# Patient Record
Sex: Male | Born: 1950 | Race: Black or African American | Hispanic: No | Marital: Married | Smoking: Never smoker
Health system: Southern US, Community
[De-identification: ages and names within clinical notes are randomized; demographics above are authoritative.]

## PROBLEM LIST (undated history)

## (undated) DIAGNOSIS — R339 Retention of urine, unspecified: Secondary | ICD-10-CM

## (undated) DIAGNOSIS — Z1624 Resistance to multiple antibiotics: Secondary | ICD-10-CM

## (undated) DIAGNOSIS — F419 Anxiety disorder, unspecified: Secondary | ICD-10-CM

## (undated) DIAGNOSIS — D649 Anemia, unspecified: Secondary | ICD-10-CM

## (undated) DIAGNOSIS — E119 Type 2 diabetes mellitus without complications: Secondary | ICD-10-CM

## (undated) DIAGNOSIS — I10 Essential (primary) hypertension: Secondary | ICD-10-CM

## (undated) DIAGNOSIS — R532 Functional quadriplegia: Secondary | ICD-10-CM

## (undated) DIAGNOSIS — N390 Urinary tract infection, site not specified: Secondary | ICD-10-CM

## (undated) DIAGNOSIS — J9610 Chronic respiratory failure, unspecified whether with hypoxia or hypercapnia: Secondary | ICD-10-CM

## (undated) HISTORY — PX: ROTATOR CUFF REPAIR: SHX139

## (undated) HISTORY — PX: TRACHEOSTOMY: SUR1362

## (undated) HISTORY — PX: COLOSTOMY: SHX63

## (undated) HISTORY — PX: PEG PLACEMENT: SHX5437

---

## 2016-04-10 DIAGNOSIS — M75102 Unspecified rotator cuff tear or rupture of left shoulder, not specified as traumatic: Secondary | ICD-10-CM | POA: Insufficient documentation

## 2016-12-12 DIAGNOSIS — G4733 Obstructive sleep apnea (adult) (pediatric): Secondary | ICD-10-CM | POA: Insufficient documentation

## 2019-05-15 ENCOUNTER — Encounter (HOSPITAL_COMMUNITY): Payer: Self-pay

## 2019-05-15 ENCOUNTER — Other Ambulatory Visit: Payer: Self-pay

## 2019-05-15 ENCOUNTER — Emergency Department (HOSPITAL_COMMUNITY): Payer: Medicare Other

## 2019-05-15 ENCOUNTER — Emergency Department (HOSPITAL_COMMUNITY)
Admission: EM | Admit: 2019-05-15 | Discharge: 2019-05-15 | Disposition: A | Discharge status: Home / Self Care | Payer: Medicare Other | Attending: Emergency Medicine | Admitting: Emergency Medicine

## 2019-05-15 DIAGNOSIS — Z20822 Contact with and (suspected) exposure to covid-19: Secondary | ICD-10-CM | POA: Diagnosis not present

## 2019-05-15 DIAGNOSIS — Z930 Tracheostomy status: Secondary | ICD-10-CM | POA: Insufficient documentation

## 2019-05-15 DIAGNOSIS — K529 Noninfective gastroenteritis and colitis, unspecified: Secondary | ICD-10-CM | POA: Diagnosis not present

## 2019-05-15 DIAGNOSIS — I10 Essential (primary) hypertension: Secondary | ICD-10-CM | POA: Insufficient documentation

## 2019-05-15 DIAGNOSIS — J189 Pneumonia, unspecified organism: Secondary | ICD-10-CM | POA: Diagnosis not present

## 2019-05-15 DIAGNOSIS — R0602 Shortness of breath: Secondary | ICD-10-CM | POA: Diagnosis present

## 2019-05-15 HISTORY — DX: Essential (primary) hypertension: I10

## 2019-05-15 HISTORY — DX: Chronic respiratory failure, unspecified whether with hypoxia or hypercapnia: J96.10

## 2019-05-15 LAB — CBC WITH DIFFERENTIAL/PLATELET
Abs Immature Granulocytes: 0.02 10*3/uL (ref 0.00–0.07)
Basophils Absolute: 0.1 10*3/uL (ref 0.0–0.1)
Basophils Relative: 1 %
Eosinophils Absolute: 0 10*3/uL (ref 0.0–0.5)
Eosinophils Relative: 0 %
HCT: 38.5 % — ABNORMAL LOW (ref 39.0–52.0)
Hemoglobin: 12.6 g/dL — ABNORMAL LOW (ref 13.0–17.0)
Immature Granulocytes: 0 %
Lymphocytes Relative: 30 %
Lymphs Abs: 2.3 10*3/uL (ref 0.7–4.0)
MCH: 24.4 pg — ABNORMAL LOW (ref 26.0–34.0)
MCHC: 32.7 g/dL (ref 30.0–36.0)
MCV: 74.5 fL — ABNORMAL LOW (ref 80.0–100.0)
Monocytes Absolute: 1.1 10*3/uL — ABNORMAL HIGH (ref 0.1–1.0)
Monocytes Relative: 15 %
Neutro Abs: 4 10*3/uL (ref 1.7–7.7)
Neutrophils Relative %: 54 %
Platelets: 278 10*3/uL (ref 150–400)
RBC: 5.17 MIL/uL (ref 4.22–5.81)
RDW: 18.1 % — ABNORMAL HIGH (ref 11.5–15.5)
WBC: 7.5 10*3/uL (ref 4.0–10.5)
nRBC: 0 % (ref 0.0–0.2)

## 2019-05-15 LAB — URINALYSIS, ROUTINE W REFLEX MICROSCOPIC
Bilirubin Urine: NEGATIVE
Glucose, UA: NEGATIVE mg/dL
Hgb urine dipstick: NEGATIVE
Ketones, ur: NEGATIVE mg/dL
Leukocytes,Ua: NEGATIVE
Nitrite: NEGATIVE
Protein, ur: NEGATIVE mg/dL
Specific Gravity, Urine: 1.017 (ref 1.005–1.030)
pH: 6 (ref 5.0–8.0)

## 2019-05-15 LAB — POCT I-STAT 7, (LYTES, BLD GAS, ICA,H+H)
Acid-Base Excess: 3 mmol/L — ABNORMAL HIGH (ref 0.0–2.0)
Bicarbonate: 27.2 mmol/L (ref 20.0–28.0)
Calcium, Ion: 1.26 mmol/L (ref 1.15–1.40)
HCT: 37 % — ABNORMAL LOW (ref 39.0–52.0)
Hemoglobin: 12.6 g/dL — ABNORMAL LOW (ref 13.0–17.0)
O2 Saturation: 97 %
Patient temperature: 98.3
Potassium: 4.3 mmol/L (ref 3.5–5.1)
Sodium: 141 mmol/L (ref 135–145)
TCO2: 28 mmol/L (ref 22–32)
pCO2 arterial: 38.8 mmHg (ref 32.0–48.0)
pH, Arterial: 7.453 — ABNORMAL HIGH (ref 7.350–7.450)
pO2, Arterial: 86 mmHg (ref 83.0–108.0)

## 2019-05-15 LAB — BASIC METABOLIC PANEL
Anion gap: 13 (ref 5–15)
BUN: 35 mg/dL — ABNORMAL HIGH (ref 8–23)
CO2: 22 mmol/L (ref 22–32)
Calcium: 9.4 mg/dL (ref 8.9–10.3)
Chloride: 104 mmol/L (ref 98–111)
Creatinine, Ser: 0.77 mg/dL (ref 0.61–1.24)
GFR calc Af Amer: >60 mL/min (ref >60)
GFR calc non Af Amer: >60 mL/min (ref >60)
Glucose, Bld: 107 mg/dL — ABNORMAL HIGH (ref 70–99)
Potassium: 5.2 mmol/L — ABNORMAL HIGH (ref 3.5–5.1)
Sodium: 139 mmol/L (ref 135–145)

## 2019-05-15 LAB — RESPIRATORY PANEL BY RT PCR (FLU A&B, COVID)
Influenza A by PCR: NEGATIVE
Influenza B by PCR: NEGATIVE
SARS Coronavirus 2 by RT PCR: NEGATIVE

## 2019-05-15 MED ORDER — IOHEXOL 300 MG/ML  SOLN
100.0000 mL | Freq: Once | INTRAMUSCULAR | Status: AC | PRN
  Administered 2019-05-15: 100 mL via INTRAVENOUS

## 2019-05-15 MED ORDER — AMOXICILLIN-POT CLAVULANATE 875-125 MG PO TABS
1.0000 | ORAL_TABLET | Freq: Two times a day (BID) | ORAL | 0 refills | Status: DC
Start: 2019-05-15 — End: 2019-07-06

## 2019-05-15 MED ORDER — SODIUM CHLORIDE 0.9 % IV SOLN
1.0000 g | Freq: Once | INTRAVENOUS | Status: DC
  Filled 2019-05-15: qty 1

## 2019-05-15 MED ORDER — SODIUM ZIRCONIUM CYCLOSILICATE 10 G PO PACK
10.0000 g | PACK | Freq: Once | ORAL | Status: DC
  Filled 2019-05-15: qty 1

## 2019-05-15 MED ORDER — VANCOMYCIN HCL 1750 MG/350ML IV SOLN
1750.0000 mg | Freq: Once | INTRAVENOUS | Status: AC
  Administered 2019-05-15: 1750 mg via INTRAVENOUS
  Filled 2019-05-15: qty 350

## 2019-05-15 MED ORDER — SODIUM CHLORIDE 0.9 % IV SOLN
2.0000 g | Freq: Once | INTRAVENOUS | Status: AC
  Administered 2019-05-15: 2 g via INTRAVENOUS
  Filled 2019-05-15: qty 2

## 2019-05-15 MED ORDER — SODIUM CHLORIDE 0.9% FLUSH: 10.0000 mL | INTRAVENOUS | Status: DC | PRN

## 2019-05-15 MED ORDER — VANCOMYCIN HCL 750 MG/150ML IV SOLN
750.0000 mg | Freq: Three times a day (TID) | INTRAVENOUS | Status: DC
  Filled 2019-05-15: qty 150

## 2019-05-15 MED ORDER — SODIUM CHLORIDE 0.9% FLUSH: 10.0000 mL | Freq: Two times a day (BID) | INTRAVENOUS | Status: DC

## 2019-05-15 NOTE — ED Provider Notes (Signed)
.  MSE was initiated and I personally evaluated the patient and placed orders (if any) at  6:28 AM on May 15, 2019.  The patient appears stable so that the remainder of the MSE may be completed by another provider.    Melea Prezioso, Delice Bison, DO 05/15/19 [phone]

## 2019-05-15 NOTE — ED Notes (Signed)
All appropriate discharge materials reviewed at length with patient. Time for questions provided. Pt has no other questions at this time and verbalizes understanding of all provided materials.  

## 2019-05-15 NOTE — Progress Notes (Signed)
Pharmacy Antibiotic Note  Luis Orr is a 69 y.o. male with chronic trach and functional quadriplegia admitted on 05/15/2019 from [facility] with shortness of breath concern for pneumonia.  Pharmacy has been consulted for Vancomycin dosing. Also receiving Cefepime in ED x1.   Patient is currently afebrile, CXR with atelectasis L >R. WBC wnl.  Note limited vascular access -- Midline.   Plan: Vancomycin 1750 mg IV x1, then Vancomycin 750 mg IV Q 8 hrs x 8 days.  Goal AUC 400-550. Expected AUC: 484 SCr used: 0.8 Follow-up if further gram negative coverage needed.  Recommend MRSA PCR to evaluate need for Vancomycin.   Increased Cefepime to 2g IV x1 Height: 5\' 10"  (177.8 cm) Weight: 81.6 kg (180 lb) IBW/kg (Calculated) : 73  Temp (24hrs), Avg:98.3 F (36.8 C), Min:98.3 F (36.8 C), Max:98.3 F (36.8 C)  No results for input(s): WBC, CREATININE, LATICACIDVEN, VANCOTROUGH, VANCOPEAK, VANCORANDOM, GENTTROUGH, GENTPEAK, GENTRANDOM, TOBRATROUGH, TOBRAPEAK, TOBRARND, AMIKACINPEAK, AMIKACINTROU, AMIKACIN in the last 168 hours.  CrCl cannot be calculated (No successful lab value found.).    Not on File  Antimicrobials this admission: Vanc 5/2 >> Cefepime 5/2 x1  Dose adjustments this admission:  Microbiology results: 5/2 COVID/Flu PCR negative  Thank you for allowing pharmacy to be a part of this patient's care.  Sloan Leiter, PharmD, BCPS, BCCCP Clinical Pharmacist Please refer to [facility] for [place] numbers 05/15/2019 7:50 AM

## 2019-05-15 NOTE — ED Triage Notes (Addendum)
Pt arrives via [facility] from [facility] for acute dyspnea that began approx 2 hours ago per staff. Patient has tracheostmy, EMS advised vent settings have not been adjusted and patient has been 97%. A&O to baseline. Pt advises of mild malaise. BP, HR WNL

## 2019-05-15 NOTE — ED Notes (Signed)
Report given to receiving [facility] nurse

## 2019-05-15 NOTE — Progress Notes (Signed)
Midline placed today. Patient with limited vascular access. Instructed on guidelines of vancomycin use. Per MD patient will be discharged with midline. Doctor and nurse, Percell Locus, at bedside and instructed on use of midline with Vancomycin only allowed to be used for no more than 5 days for vancomycin. Instructed to notify receiving facility of line restrictions and use. Percell Locus RN VU. Fran Lowes, NR VAST

## 2019-05-15 NOTE — ED Notes (Signed)
Luis Orr ([facility]/Transfer back [facility] called @ 1411)-per Phil, Therapist, sports called by Levada Dy

## 2019-05-15 NOTE — Discharge Instructions (Signed)
You have been evaluated for your abdominal pain and trouble breathing.  Your x-ray show possible pneumonia, a CT scan of the abdomen and pelvis shows possible colitis.  Please take antibiotic as prescribed as able cover for both infection.  Return if condition worsen.

## 2019-05-15 NOTE — ED Provider Notes (Signed)
[facility] EMERGENCY DEPARTMENT Provider Note   CSN: [id number] Arrival date & time: 05/15/19  3664     History Chief Complaint  Patient presents with  . Shortness of Breath    Luis Orr is a 69 y.o. male.  The history is provided by the EMS personnel and medical records. No language interpreter was used.  Shortness of Breath     69 year old male with history of chronic nausea s with tracheostomy, hypertension, functional quadriplegia brought here via EMS from [facility] for evaluation of shortness of breath.  Per EMS, patient developed difficulty breathing approximately 2 hours ago per nursing staff.  EMS was advised to the vent setting have not been adjusted and outpatient has been 97%.  It was reported that patient was alert oriented to baseline.  Patient brought here for further management.  History is limited due to his baseline tracheostomy.  Level 5 caveats  Past Medical History:  Diagnosis Date  . Chronic respiratory failure ([place])   . Hypertension     There are no problems to display for this patient.   The histories are not reviewed yet. Please review them in the "History" navigator section and refresh this Gray.     No family history on file.  Social History   Tobacco Use  . Smoking status: Not on file  Substance Use Topics  . Alcohol use: Not on file  . Drug use: Not on file    Home Medications Prior to Admission medications   Not on File    Allergies    Patient has no allergy information on record.  Review of Systems   Review of Systems  Unable to perform ROS: Patient nonverbal  Respiratory: Positive for shortness of breath.     Physical Exam Updated Vital Signs Pulse 80   Temp 98.3 F (36.8 C) (Oral)   Resp 15   Ht 5\' 10"  (1.778 m)   Wt 81.6 kg   SpO2 99%   BMI 25.83 kg/m   Physical Exam Constitutional:      Comments: Chronically ill-appearing male laying in bed currently altered to event.  Patient does  not appear to be in any acute distress.  Neck:     Comments: Trach in place.  No tracheal deviation. Cardiovascular:     Rate and Rhythm: Normal rate and regular rhythm.  Pulmonary:     Breath sounds: Decreased breath sounds and rhonchi (Faint scattered rhonchi heard.) present.  Chest:     Chest wall: No tenderness.  Abdominal:     Tenderness: There is abdominal tenderness (Patient once mildly when abdomen is palpated.  No guarding.  No focal tenderness.).  Musculoskeletal:     Comments: Minimal strength to all 4 extremities due to underlying quadriplegia.     ED Results / Procedures / Treatments   Labs (all labs ordered are listed, but only abnormal results are displayed) Labs Reviewed  CBC WITH DIFFERENTIAL/PLATELET - Abnormal; Notable for the following components:      Result Value   Hemoglobin 12.6 (*)    HCT 38.5 (*)    MCV 74.5 (*)    MCH 24.4 (*)    RDW 18.1 (*)    Monocytes Absolute 1.1 (*)    All other components within normal limits  BASIC METABOLIC PANEL - Abnormal; Notable for the following components:   Potassium 5.2 (*)    Glucose, Bld 107 (*)    BUN 35 (*)    All other components within normal  limits  POCT I-STAT 7, (LYTES, BLD GAS, ICA,H+H) - Abnormal; Notable for the following components:   pH, Arterial 7.453 (*)    Acid-Base Excess 3.0 (*)    HCT 37.0 (*)    Hemoglobin 12.6 (*)    All other components within normal limits  RESPIRATORY PANEL BY RT PCR (FLU A&B, COVID)  URINALYSIS, ROUTINE W REFLEX MICROSCOPIC  BLOOD GAS, ARTERIAL    EKG EKG Interpretation  Date/Time:  Sunday May 15 2019 34:19:62 EDT Ventricular Rate:  71 PR Interval:    QRS Duration: 83 QT Interval:  390 QTC Calculation: 424 R Axis:   -14 Text Interpretation: Sinus rhythm Atrial premature complex Borderline T wave abnormalities No old tracing to compare Confirmed by Ward, Cyril Mourning [phone]) on 05/15/2019 6:35:27 AM   Radiology CT ABDOMEN PELVIS W CONTRAST  Result Date:  05/15/2019 CLINICAL DATA:  Abdominal pain EXAM: CT ABDOMEN AND PELVIS WITH CONTRAST TECHNIQUE: Multidetector CT imaging of the abdomen and pelvis was performed using the standard protocol following bolus administration of intravenous contrast. CONTRAST:  145mL OMNIPAQUE IOHEXOL 300 MG/ML  SOLN COMPARISON:  None. FINDINGS: Lower chest: Bibasilar atelectasis. Hepatobiliary: No focal liver abnormality is seen. No gallstones, gallbladder wall thickening, or biliary dilatation. Pancreas: Unremarkable Spleen: Unremarkable. Adrenals/Urinary Tract: Right renal atrophy. Multiple left renal cysts. Nonobstructing left lower pole renal calculi measuring up to 7 mm craniocaudally. Adrenals are unremarkable. The bladder is decompressed by a Foley catheter. Stomach/Bowel: Stomach is unremarkable apart from PEG tube placement. Appendix is normal. Stool throughout the colon with distention of the rectum and mild wall thickening. Small bowel is normal in caliber. Vascular/Lymphatic: Aortic atherosclerosis. No enlarged abdominal or pelvic lymph nodes. Reproductive: Unremarkable. Other: No ascites. Musculoskeletal: Degenerative changes of the included spine. IMPRESSION: Stool distending the rectum with mild wall thickening and potential impaction. Stercoral colitis is also not excluded given mild wall thickening. Electronically Signed   By: Macy Mis M.D.   On: 05/15/2019 13:44   DG Chest Portable 1 View  Result Date: 05/15/2019 CLINICAL DATA:  Shortness of breath EXAM: PORTABLE CHEST 1 VIEW COMPARISON:  None. FINDINGS: Tracheostomy device is present. Patchy density at the left greater than right lung bases. No significant pleural effusion. No pneumothorax. Cardiomediastinal contours are likely within normal limits. IMPRESSION: Mild patchy atelectasis/consolidation at the left greater than right lung bases. Electronically Signed   By: Macy Mis M.D.   On: 05/15/2019 07:30    Procedures Procedures (including critical  care time)  Medications Ordered in ED Medications  sodium chloride flush (NS) 0.9 % injection 10-40 mL (has no administration in time range)  sodium chloride flush (NS) 0.9 % injection 10-40 mL (has no administration in time range)  vancomycin (VANCOREADY) IVPB 750 mg/150 mL (has no administration in time range)  vancomycin (VANCOREADY) IVPB 1750 mg/350 mL (1,750 mg Intravenous New Bag/Given 05/15/19 1100)  ceFEPIme (MAXIPIME) 2 g in sodium chloride 0.9 % 100 mL IVPB (0 g Intravenous Stopped 05/15/19 1054)  iohexol (OMNIPAQUE) 300 MG/ML solution 100 mL (100 mLs Intravenous Contrast Given 05/15/19 1252)    ED Course  I have reviewed the triage vital signs and the nursing notes.  Pertinent labs & imaging results that were available during my care of the patient were reviewed by me and considered in my medical decision making (see chart for details).    MDM Rules/Calculators/A&P                      BP (!) 137/91  Pulse (!) 115   Temp 98.3 F (36.8 C) (Oral)   Resp 16   Ht 5\' 10"  (1.778 m)   Wt 81.6 kg   SpO2 97%   BMI 25.83 kg/m   Final Clinical Impression(s) / ED Diagnoses Final diagnoses:  Community acquired pneumonia of left lower lobe of lung  Colitis    Rx / DC Orders ED Discharge Orders         Ordered    amoxicillin-clavulanate (AUGMENTIN) 875-125 MG tablet  2 times daily     05/15/19 1400         6:55 AM Patient with history of tracheostomy on vent here with reportedly worsening shortness of breath for the past 2 hrs, no vent setting change reported.  Pt does not appear to be in acute respiratory distress.    Labs remarkable for hypokalemia with potassium of 5.2 without EKG changes.  Repeat labs showing normal K+ of 4.2.  First value is likely due to hemolysis.  X-ray of the chest demonstrate mild patchy atelectasis/consolidation at the left greater than right lung base.  In the setting of shortness of breath, and being on the vent, will initiate antibiotics to  treat for pneumonia, however CXR reviewed by me does not show any significant infiltration.  Care discussed with Dr. Jeanell Sparrow.   11:05 AM I did reach out to speak with patient's wife over the phone.  It sounds like patient was having abdominal pain and trouble breathing this morning prompting the ER visit.  On exam he does have abdominal tenderness.  Due to inability to communicate, will obtain CT scan of his abdomen pelvis to further evaluation.  Will check UA.  1:58 PM UA without any signs of urinary tract infection.  COVID-19 test is negative.  Normal WBC.  An abdominal and pelvis CT scan was obtained showing stool distending the rectum with mild wall thickening and potential impaction.  Stercoral colitis is also not excluded given mild wall thickening.  I performed digital rectal exam under supervision.  Patient has soft stool in the rectal vault and no obvious signs of stool impaction that I can palpate.  Plan to discharge home with Augmentin to cover for potential colitis/pneumonia.  I have called and updated patient's wife over the phone.  Luis Orr was evaluated in Emergency Department on 05/15/2019 for the symptoms described in the history of present illness. He was evaluated in the context of the global COVID-19 pandemic, which necessitated consideration that the patient might be at risk for infection with the SARS-CoV-2 virus that causes COVID-19. Institutional protocols and algorithms that pertain to the evaluation of patients at risk for COVID-19 are in a state of rapid change based on information released by regulatory bodies including the CDC and federal and state organizations. These policies and algorithms were followed during the patient's care in the ED.    Domenic Moras, PA-C 05/15/19 1402    Pattricia Boss, MD 05/15/19 [phone]

## 2019-05-15 NOTE — Progress Notes (Signed)
RT note: RT and RN transported vent patient to CT and back to his room. Vital signs stable through out.

## 2019-05-15 NOTE — ED Notes (Signed)
IV team at pt bedside 

## 2019-05-24 DIAGNOSIS — J449 Chronic obstructive pulmonary disease, unspecified: Secondary | ICD-10-CM

## 2019-05-24 DIAGNOSIS — J9621 Acute and chronic respiratory failure with hypoxia: Secondary | ICD-10-CM

## 2019-05-24 DIAGNOSIS — Z930 Tracheostomy status: Secondary | ICD-10-CM

## 2019-05-24 DIAGNOSIS — G8250 Quadriplegia, unspecified: Secondary | ICD-10-CM

## 2019-05-26 DIAGNOSIS — G8250 Quadriplegia, unspecified: Secondary | ICD-10-CM

## 2019-05-26 DIAGNOSIS — J9621 Acute and chronic respiratory failure with hypoxia: Secondary | ICD-10-CM

## 2019-05-26 DIAGNOSIS — J449 Chronic obstructive pulmonary disease, unspecified: Secondary | ICD-10-CM

## 2019-05-26 DIAGNOSIS — Z930 Tracheostomy status: Secondary | ICD-10-CM

## 2019-06-07 DIAGNOSIS — Z930 Tracheostomy status: Secondary | ICD-10-CM

## 2019-06-07 DIAGNOSIS — G8250 Quadriplegia, unspecified: Secondary | ICD-10-CM

## 2019-06-07 DIAGNOSIS — J9621 Acute and chronic respiratory failure with hypoxia: Secondary | ICD-10-CM

## 2019-06-09 DIAGNOSIS — J9621 Acute and chronic respiratory failure with hypoxia: Secondary | ICD-10-CM

## 2019-06-09 DIAGNOSIS — Z930 Tracheostomy status: Secondary | ICD-10-CM

## 2019-06-09 DIAGNOSIS — I5022 Chronic systolic (congestive) heart failure: Secondary | ICD-10-CM

## 2019-07-06 ENCOUNTER — Inpatient Hospital Stay (HOSPITAL_COMMUNITY)
Admission: AD | Admit: 2019-07-06 | Discharge: 2019-07-12 | DRG: 870 | Disposition: A | Discharge status: Other Acute Inpatient Hospital | Payer: Medicare Other | Attending: Internal Medicine | Admitting: Internal Medicine

## 2019-07-06 ENCOUNTER — Emergency Department (HOSPITAL_COMMUNITY): Payer: Medicare Other

## 2019-07-06 ENCOUNTER — Encounter (HOSPITAL_COMMUNITY): Payer: Self-pay | Admitting: *Deleted

## 2019-07-06 ENCOUNTER — Other Ambulatory Visit: Payer: Self-pay

## 2019-07-06 DIAGNOSIS — Z20822 Contact with and (suspected) exposure to covid-19: Secondary | ICD-10-CM | POA: Diagnosis not present

## 2019-07-06 DIAGNOSIS — Z9911 Dependence on respirator [ventilator] status: Secondary | ICD-10-CM | POA: Diagnosis not present

## 2019-07-06 DIAGNOSIS — J95851 Ventilator associated pneumonia: Secondary | ICD-10-CM | POA: Diagnosis not present

## 2019-07-06 DIAGNOSIS — Z794 Long term (current) use of insulin: Secondary | ICD-10-CM | POA: Diagnosis not present

## 2019-07-06 DIAGNOSIS — R532 Functional quadriplegia: Secondary | ICD-10-CM | POA: Diagnosis present

## 2019-07-06 DIAGNOSIS — E876 Hypokalemia: Secondary | ICD-10-CM | POA: Diagnosis present

## 2019-07-06 DIAGNOSIS — Z931 Gastrostomy status: Secondary | ICD-10-CM | POA: Diagnosis not present

## 2019-07-06 DIAGNOSIS — N390 Urinary tract infection, site not specified: Secondary | ICD-10-CM | POA: Diagnosis present

## 2019-07-06 DIAGNOSIS — K567 Ileus, unspecified: Secondary | ICD-10-CM | POA: Diagnosis present

## 2019-07-06 DIAGNOSIS — Y838 Other surgical procedures as the cause of abnormal reaction of the patient, or of later complication, without mention of misadventure at the time of the procedure: Secondary | ICD-10-CM | POA: Diagnosis not present

## 2019-07-06 DIAGNOSIS — L89153 Pressure ulcer of sacral region, stage 3: Secondary | ICD-10-CM | POA: Diagnosis present

## 2019-07-06 DIAGNOSIS — K529 Noninfective gastroenteritis and colitis, unspecified: Secondary | ICD-10-CM

## 2019-07-06 DIAGNOSIS — K5289 Other specified noninfective gastroenteritis and colitis: Secondary | ICD-10-CM | POA: Diagnosis not present

## 2019-07-06 DIAGNOSIS — A419 Sepsis, unspecified organism: Secondary | ICD-10-CM | POA: Diagnosis present

## 2019-07-06 DIAGNOSIS — F419 Anxiety disorder, unspecified: Secondary | ICD-10-CM | POA: Diagnosis not present

## 2019-07-06 DIAGNOSIS — R109 Unspecified abdominal pain: Secondary | ICD-10-CM | POA: Diagnosis present

## 2019-07-06 DIAGNOSIS — G9520 Unspecified cord compression: Secondary | ICD-10-CM | POA: Diagnosis not present

## 2019-07-06 DIAGNOSIS — J9509 Other tracheostomy complication: Secondary | ICD-10-CM | POA: Diagnosis not present

## 2019-07-06 DIAGNOSIS — Z79899 Other long term (current) drug therapy: Secondary | ICD-10-CM | POA: Diagnosis not present

## 2019-07-06 DIAGNOSIS — E119 Type 2 diabetes mellitus without complications: Secondary | ICD-10-CM

## 2019-07-06 DIAGNOSIS — R069 Unspecified abnormalities of breathing: Secondary | ICD-10-CM

## 2019-07-06 DIAGNOSIS — Z79891 Long term (current) use of opiate analgesic: Secondary | ICD-10-CM | POA: Diagnosis not present

## 2019-07-06 DIAGNOSIS — J9610 Chronic respiratory failure, unspecified whether with hypoxia or hypercapnia: Secondary | ICD-10-CM | POA: Diagnosis not present

## 2019-07-06 DIAGNOSIS — I10 Essential (primary) hypertension: Secondary | ICD-10-CM | POA: Diagnosis present

## 2019-07-06 DIAGNOSIS — B961 Klebsiella pneumoniae [K. pneumoniae] as the cause of diseases classified elsewhere: Secondary | ICD-10-CM | POA: Diagnosis not present

## 2019-07-06 DIAGNOSIS — B958 Unspecified staphylococcus as the cause of diseases classified elsewhere: Secondary | ICD-10-CM | POA: Diagnosis present

## 2019-07-06 DIAGNOSIS — A4159 Other Gram-negative sepsis: Secondary | ICD-10-CM | POA: Diagnosis not present

## 2019-07-06 DIAGNOSIS — R7881 Bacteremia: Secondary | ICD-10-CM | POA: Diagnosis present

## 2019-07-06 DIAGNOSIS — K5641 Fecal impaction: Secondary | ICD-10-CM | POA: Diagnosis present

## 2019-07-06 DIAGNOSIS — E118 Type 2 diabetes mellitus with unspecified complications: Secondary | ICD-10-CM | POA: Diagnosis present

## 2019-07-06 DIAGNOSIS — Y848 Other medical procedures as the cause of abnormal reaction of the patient, or of later complication, without mention of misadventure at the time of the procedure: Secondary | ICD-10-CM | POA: Diagnosis present

## 2019-07-06 HISTORY — DX: Type 2 diabetes mellitus without complications: E11.9

## 2019-07-06 HISTORY — DX: Functional quadriplegia: R53.2

## 2019-07-06 HISTORY — DX: Anxiety disorder, unspecified: F41.9

## 2019-07-06 HISTORY — DX: Retention of urine, unspecified: R33.9

## 2019-07-06 HISTORY — DX: Anemia, unspecified: D64.9

## 2019-07-06 LAB — CBC
HCT: 41.4 % (ref 39.0–52.0)
Hemoglobin: 13.1 g/dL (ref 13.0–17.0)
MCH: 23.9 pg — ABNORMAL LOW (ref 26.0–34.0)
MCHC: 31.6 g/dL (ref 30.0–36.0)
MCV: 75.7 fL — ABNORMAL LOW (ref 80.0–100.0)
Platelets: 264 10*3/uL (ref 150–400)
RBC: 5.47 MIL/uL (ref 4.22–5.81)
RDW: 16.4 % — ABNORMAL HIGH (ref 11.5–15.5)
WBC: 16.6 10*3/uL — ABNORMAL HIGH (ref 4.0–10.5)
nRBC: 0 % (ref 0.0–0.2)

## 2019-07-06 LAB — URINALYSIS, ROUTINE W REFLEX MICROSCOPIC
Bilirubin Urine: NEGATIVE
Glucose, UA: 150 mg/dL — AB
Ketones, ur: 5 mg/dL — AB
Nitrite: NEGATIVE
Protein, ur: 100 mg/dL — AB
Specific Gravity, Urine: 1.014 (ref 1.005–1.030)
WBC, UA: >50 WBC/hpf — ABNORMAL HIGH (ref 0–5)
pH: 5 (ref 5.0–8.0)

## 2019-07-06 LAB — COMPREHENSIVE METABOLIC PANEL
ALT: 36 U/L (ref 0–44)
AST: 44 U/L — ABNORMAL HIGH (ref 15–41)
Albumin: 3.5 g/dL (ref 3.5–5.0)
Alkaline Phosphatase: 86 U/L (ref 38–126)
Anion gap: 18 — ABNORMAL HIGH (ref 5–15)
BUN: 29 mg/dL — ABNORMAL HIGH (ref 8–23)
CO2: 18 mmol/L — ABNORMAL LOW (ref 22–32)
Calcium: 9.5 mg/dL (ref 8.9–10.3)
Chloride: 105 mmol/L (ref 98–111)
Creatinine, Ser: 0.62 mg/dL (ref 0.61–1.24)
GFR calc Af Amer: >60 mL/min (ref >60)
GFR calc non Af Amer: >60 mL/min (ref >60)
Glucose, Bld: 242 mg/dL — ABNORMAL HIGH (ref 70–99)
Potassium: 4 mmol/L (ref 3.5–5.1)
Sodium: 141 mmol/L (ref 135–145)
Total Bilirubin: 1.4 mg/dL — ABNORMAL HIGH (ref 0.3–1.2)
Total Protein: 8.5 g/dL — ABNORMAL HIGH (ref 6.5–8.1)

## 2019-07-06 LAB — LIPASE, BLOOD: Lipase: 26 U/L (ref 11–51)

## 2019-07-06 LAB — PROTIME-INR
INR: 1.3 — ABNORMAL HIGH (ref 0.8–1.2)
Prothrombin Time: 15.7 seconds — ABNORMAL HIGH (ref 11.4–15.2)

## 2019-07-06 LAB — LACTIC ACID, PLASMA
Lactic Acid, Venous: 1.4 mmol/L (ref 0.5–1.9)
Lactic Acid, Venous: 1.4 mmol/L (ref 0.5–1.9)

## 2019-07-06 LAB — APTT: aPTT: 42 seconds — ABNORMAL HIGH (ref 24–36)

## 2019-07-06 MED ORDER — INSULIN DETEMIR 100 UNIT/ML ~~LOC~~ SOLN
10.0000 [IU] | Freq: Every day | SUBCUTANEOUS | Status: DC
  Filled 2019-07-06: qty 0.1

## 2019-07-06 MED ORDER — FENTANYL 25 MCG/HR TD PT72
1.0000 | MEDICATED_PATCH | TRANSDERMAL | Status: DC
  Administered 2019-07-08 – 2019-07-11 (×2): 1 via TRANSDERMAL
  Filled 2019-07-06 (×2): qty 1

## 2019-07-06 MED ORDER — INSULIN DETEMIR 100 UNIT/ML ~~LOC~~ SOLN
10.0000 [IU] | Freq: Every day | SUBCUTANEOUS | Status: DC
  Administered 2019-07-07 – 2019-07-11 (×5): 10 [IU] via SUBCUTANEOUS
  Filled 2019-07-06 (×7): qty 0.1

## 2019-07-06 MED ORDER — INSULIN ASPART 100 UNIT/ML ~~LOC~~ SOLN
0.0000 [IU] | SUBCUTANEOUS | Status: DC
  Administered 2019-07-07: 2 [IU] via SUBCUTANEOUS
  Administered 2019-07-07: 1 [IU] via SUBCUTANEOUS
  Administered 2019-07-08: 2 [IU] via SUBCUTANEOUS
  Administered 2019-07-08 – 2019-07-09 (×3): 3 [IU] via SUBCUTANEOUS
  Administered 2019-07-09 (×3): 2 [IU] via SUBCUTANEOUS
  Administered 2019-07-09: 3 [IU] via SUBCUTANEOUS
  Administered 2019-07-10: 2 [IU] via SUBCUTANEOUS
  Administered 2019-07-10: 1 [IU] via SUBCUTANEOUS
  Administered 2019-07-10: 2 [IU] via SUBCUTANEOUS
  Administered 2019-07-10 – 2019-07-11 (×4): 1 [IU] via SUBCUTANEOUS
  Administered 2019-07-11 – 2019-07-12 (×3): 2 [IU] via SUBCUTANEOUS
  Administered 2019-07-12: 1 [IU] via SUBCUTANEOUS

## 2019-07-06 MED ORDER — SODIUM CHLORIDE 0.9% FLUSH
10.0000 mL | Freq: Two times a day (BID) | INTRAVENOUS | Status: DC
  Administered 2019-07-07 (×2): 10 mL
  Administered 2019-07-07: 40 mL
  Administered 2019-07-08 – 2019-07-12 (×8): 10 mL

## 2019-07-06 MED ORDER — SENNA 8.6 MG PO TABS
1.0000 | ORAL_TABLET | Freq: Every day | ORAL | Status: DC
  Administered 2019-07-07 (×2): 8.6 mg via ORAL
  Filled 2019-07-06 (×3): qty 1

## 2019-07-06 MED ORDER — SODIUM CHLORIDE 0.9 % IV BOLUS
1000.0000 mL | Freq: Once | INTRAVENOUS | Status: AC
  Administered 2019-07-06: 1000 mL via INTRAVENOUS

## 2019-07-06 MED ORDER — SODIUM CHLORIDE 0.9 % IV BOLUS
500.0000 mL | Freq: Once | INTRAVENOUS | Status: AC
  Administered 2019-07-06: 500 mL via INTRAVENOUS

## 2019-07-06 MED ORDER — ACETAMINOPHEN 325 MG PO TABS
650.0000 mg | ORAL_TABLET | Freq: Four times a day (QID) | ORAL | Status: DC | PRN
  Filled 2019-07-06: qty 2

## 2019-07-06 MED ORDER — SERTRALINE HCL 100 MG PO TABS
100.0000 mg | ORAL_TABLET | Freq: Every day | ORAL | Status: DC
  Administered 2019-07-07 (×2): 100 mg via ORAL
  Filled 2019-07-06 (×4): qty 1

## 2019-07-06 MED ORDER — MULTI-VITAMIN/MINERALS PO TABS: 1.0000 | ORAL_TABLET | Freq: Every day | ORAL | Status: DC

## 2019-07-06 MED ORDER — POLYVINYL ALCOHOL 1.4 % OP SOLN
1.0000 [drp] | OPHTHALMIC | Status: DC | PRN
  Filled 2019-07-06: qty 15

## 2019-07-06 MED ORDER — ONDANSETRON HCL 4 MG PO TABS: 4.0000 mg | ORAL_TABLET | Freq: Four times a day (QID) | ORAL | Status: DC | PRN

## 2019-07-06 MED ORDER — ENOXAPARIN SODIUM 40 MG/0.4ML ~~LOC~~ SOLN
40.0000 mg | Freq: Every day | SUBCUTANEOUS | Status: DC
  Administered 2019-07-07 – 2019-07-11 (×6): 40 mg via SUBCUTANEOUS
  Filled 2019-07-06 (×6): qty 0.4

## 2019-07-06 MED ORDER — CHLORHEXIDINE GLUCONATE 0.12 % MT SOLN
5.0000 mL | Freq: Two times a day (BID) | OROMUCOSAL | Status: DC
  Administered 2019-07-07 – 2019-07-11 (×10): 5 mL via OROMUCOSAL
  Filled 2019-07-06 (×10): qty 15

## 2019-07-06 MED ORDER — ADULT MULTIVITAMIN W/MINERALS CH
1.0000 | ORAL_TABLET | Freq: Every day | ORAL | Status: DC
  Administered 2019-07-07 – 2019-07-12 (×6): 1 via ORAL
  Filled 2019-07-06 (×6): qty 1

## 2019-07-06 MED ORDER — OLOPATADINE HCL 0.1 % OP SOLN
1.0000 [drp] | Freq: Two times a day (BID) | OPHTHALMIC | Status: DC
  Administered 2019-07-07 – 2019-07-12 (×11): 1 [drp] via OPHTHALMIC
  Filled 2019-07-06 (×2): qty 5

## 2019-07-06 MED ORDER — PIPERACILLIN-TAZOBACTAM 3.375 G IVPB: 3.3750 g | Freq: Three times a day (TID) | INTRAVENOUS | Status: DC

## 2019-07-06 MED ORDER — HYDROMORPHONE HCL 1 MG/ML IJ SOLN
0.5000 mg | Freq: Once | INTRAMUSCULAR | Status: AC
  Administered 2019-07-06: 0.5 mg via INTRAVENOUS
  Filled 2019-07-06: qty 1

## 2019-07-06 MED ORDER — ONDANSETRON HCL 4 MG/2ML IJ SOLN: 4.0000 mg | Freq: Four times a day (QID) | INTRAMUSCULAR | Status: DC | PRN

## 2019-07-06 MED ORDER — IOHEXOL 300 MG/ML  SOLN
100.0000 mL | Freq: Once | INTRAMUSCULAR | Status: AC | PRN
  Administered 2019-07-06: 100 mL via INTRAVENOUS

## 2019-07-06 MED ORDER — HYDROCODONE-ACETAMINOPHEN 7.5-325 MG PO TABS
1.0000 | ORAL_TABLET | Freq: Two times a day (BID) | ORAL | Status: DC
  Administered 2019-07-07 – 2019-07-08 (×4): 1 via ORAL
  Filled 2019-07-06 (×5): qty 1

## 2019-07-06 MED ORDER — GABAPENTIN 300 MG PO CAPS
300.0000 mg | ORAL_CAPSULE | Freq: Two times a day (BID) | ORAL | Status: DC
  Filled 2019-07-06: qty 1

## 2019-07-06 MED ORDER — AMLODIPINE BESYLATE 10 MG PO TABS
10.0000 mg | ORAL_TABLET | Freq: Every day | ORAL | Status: DC
  Administered 2019-07-07 – 2019-07-12 (×6): 10 mg via ORAL
  Filled 2019-07-06 (×5): qty 1
  Filled 2019-07-06: qty 2

## 2019-07-06 MED ORDER — CLONAZEPAM 0.5 MG PO TABS
0.5000 mg | ORAL_TABLET | Freq: Three times a day (TID) | ORAL | Status: DC
  Administered 2019-07-07 – 2019-07-08 (×5): 0.5 mg via ORAL
  Filled 2019-07-06 (×6): qty 1

## 2019-07-06 MED ORDER — ONDANSETRON HCL 4 MG/2ML IJ SOLN
4.0000 mg | Freq: Once | INTRAMUSCULAR | Status: AC
  Administered 2019-07-06: 4 mg via INTRAVENOUS
  Filled 2019-07-06: qty 2

## 2019-07-06 MED ORDER — SODIUM CHLORIDE 0.9% FLUSH: 10.0000 mL | INTRAVENOUS | Status: DC | PRN

## 2019-07-06 MED ORDER — FAMOTIDINE 20 MG PO TABS
20.0000 mg | ORAL_TABLET | Freq: Every day | ORAL | Status: DC
  Administered 2019-07-07 (×2): 20 mg via ORAL
  Filled 2019-07-06 (×3): qty 1

## 2019-07-06 MED ORDER — INSULIN DETEMIR 100 UNIT/ML ~~LOC~~ SOLN: 5.0000 [IU] | Freq: Every day | SUBCUTANEOUS | Status: DC

## 2019-07-06 MED ORDER — CLONIDINE HCL 0.1 MG PO TABS
0.1000 mg | ORAL_TABLET | Freq: Three times a day (TID) | ORAL | Status: DC
  Administered 2019-07-07 – 2019-07-08 (×5): 0.1 mg via ORAL
  Filled 2019-07-06 (×6): qty 1

## 2019-07-06 MED ORDER — ACETAMINOPHEN 650 MG RE SUPP: 650.0000 mg | Freq: Four times a day (QID) | RECTAL | Status: DC | PRN

## 2019-07-06 MED ORDER — CARBOXYMETHYLCELLULOSE SODIUM 0.5 % OP SOLN: 1.0000 [drp] | OPHTHALMIC | Status: DC | PRN

## 2019-07-06 MED ORDER — SIMETHICONE 80 MG PO CHEW: 80.0000 mg | CHEWABLE_TABLET | Freq: Three times a day (TID) | ORAL | Status: DC | PRN

## 2019-07-06 MED ORDER — POLYETHYLENE GLYCOL 3350 17 G PO PACK: 17.0000 g | PACK | Freq: Every day | ORAL | Status: DC | PRN

## 2019-07-06 MED ORDER — IPRATROPIUM-ALBUTEROL 0.5-2.5 (3) MG/3ML IN SOLN: 3.0000 mL | Freq: Every day | RESPIRATORY_TRACT | Status: DC | PRN

## 2019-07-06 MED ORDER — PIPERACILLIN-TAZOBACTAM 3.375 G IVPB 30 MIN
3.3750 g | Freq: Once | INTRAVENOUS | Status: AC
  Administered 2019-07-06: 3.375 g via INTRAVENOUS
  Filled 2019-07-06: qty 50

## 2019-07-06 MED ORDER — DOCUSATE SODIUM 100 MG PO CAPS: 100.0000 mg | ORAL_CAPSULE | Freq: Two times a day (BID) | ORAL | Status: DC

## 2019-07-06 NOTE — ED Notes (Signed)
RT at bedside.

## 2019-07-06 NOTE — ED Notes (Signed)
Wife called, RN updated family

## 2019-07-06 NOTE — ED Notes (Signed)
Pt mouthed that he felt "a lot better"

## 2019-07-06 NOTE — H&P (Addendum)
History and Physical    Luis Orr QQV:[id number] DOB: [birth year] DOA: 07/06/2019  PCP: Townsend Roger, MD  Patient coming from: [facility]  I have personally briefly reviewed patient's old medical records in [place]  Chief Complaint: Abd pain  HPI: Luis Orr is a 69 y.o. 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  Pt is Vent dependent as a result since end of 2018.  Pt has been residing in [facility] on chronic vent since Jan 2019.  Despite the quadriplegia and respiratory failure, pt is apparently able to urinate without the use of a catheter.  Pt sent in to the ED today with c/o abd pain.  Concern for worsening abd distention and "ileus".  Patient seems AAOx3, but history taking is difficult secondary to pt being on chronic mechanical ventilator and unable to make audible words (have to lip read patient).  Thus most of the history is confirmed from his wife.   ED Course: UA demonstrates UTI.  CT abd pelvis demonstrates fecal impaction and stercoral colitis.  No mention of SBO or ileus on CT.  Pt started on zosyn in ED, hospitalist asked to admit.   Review of Systems: As per HPI, otherwise all review of systems negative.  Past Medical History:  Diagnosis Date  . Anemia   . Anxiety   . Chronic respiratory failure ([place])   . DM2 (diabetes mellitus, type 2) ([place])   . Functional quadriplegia (HCC)   . Hypertension   . Urine retention     History reviewed. No pertinent surgical history.   has no history on file for tobacco use, alcohol use, and drug use.  No Known Allergies  No family history on file. Some h/o lung cancer in family.  Prior to Admission medications   Medication Sig Start Date End Date Taking? Authorizing Provider  amLODipine (NORVASC) 10 MG tablet Take 10 mg by mouth daily.   Yes [provider]  benzonatate (TESSALON) 100 MG capsule Take 100 mg by mouth 3  (three) times daily as needed for cough.   Yes [provider]  carboxymethylcellulose (REFRESH PLUS) 0.5 % SOLN Place 1 drop into both eyes as needed (lubricant).   Yes [provider]  chlorhexidine (PERIDEX) 0.12 % solution Use as directed 5 mLs in the mouth or throat 2 (two) times daily.   Yes [provider]  clonazePAM (KLONOPIN) 0.5 MG tablet Take 0.5 mg by mouth every 8 (eight) hours.   Yes [provider]  cloNIDine (CATAPRES) 0.1 MG tablet Take 0.1 mg by mouth every 8 (eight) hours.   Yes [provider]  docusate sodium (COLACE) 100 MG capsule Take 100 mg by mouth every 12 (twelve) hours.   Yes [provider]  famotidine (PEPCID) 40 MG tablet Take 20 mg by mouth at bedtime.   Yes [provider]  fentaNYL (DURAGESIC) 25 MCG/HR Place 1 patch onto the skin every 3 (three) days.   Yes [provider]  furosemide (LASIX) 40 MG tablet Take 40 mg by mouth daily.   Yes [provider]  gabapentin (NEURONTIN) 300 MG capsule Take 300 mg by mouth 2 (two) times daily.   Yes [provider]  hydrALAZINE (APRESOLINE) 25 MG tablet Take 25 mg by mouth See admin instructions. Taking every 4 hours if SBP > 160 as needed   Yes [provider]  HYDROcodone-acetaminophen (NORCO) 7.5-325 MG tablet Take 1 tablet by mouth in  the morning and at bedtime.    Yes [provider]  insulin detemir (LEVEMIR) 100 UNIT/ML injection Inject 10 Units into the skin at bedtime.   Yes [provider]  insulin regular (NOVOLIN R) 100 units/mL injection Inject 2-10 Units into the skin in the morning, at noon, in the evening, and at bedtime. Per sliding scale:  151-200 = 2 units, 201-250= 4 units, 251-300= 6 units, 301-350= 8 units, 351-400= 10 units.   Yes [provider]  ipratropium-albuterol (DUONEB) 0.5-2.5 (3) MG/3ML SOLN Take 3 mLs by nebulization daily as needed (wheezing).    Yes [provider]  loperamide (IMODIUM A-D) 2 MG tablet Take 2 mg by mouth every 4 (four) hours as needed for diarrhea or loose stools.   Yes [provider]  Multiple Vitamins-Minerals (MULTIVITAMIN WITH MINERALS) tablet Take 1 tablet by mouth daily.   Yes [provider]  Olopatadine HCl 0.2 % SOLN Place 1 drop into both eyes daily.   Yes [provider]  polyethylene glycol (MIRALAX / GLYCOLAX) 17 g packet Take 17 g by mouth daily as needed for mild constipation.   Yes [provider]  potassium chloride 20 MEQ/15ML (10%) SOLN Take 40 mEq by mouth daily.   Yes [provider]  senna (SENOKOT) 8.6 MG TABS tablet Take 1 tablet by mouth at bedtime.   Yes [provider]  sertraline (ZOLOFT) 100 MG tablet Take 100 mg by mouth at bedtime.   Yes [provider]  simethicone (MYLICON) 80 MG chewable tablet Chew 80 mg by mouth every 8 (eight) hours as needed for flatulence.   Yes [provider]    Physical Exam: Vitals:   07/06/19 2100 07/06/19 2115 07/06/19 2305 07/06/19 2321  BP: (!) 122/95 115/90    Pulse: 77 82 77 78  Resp: 16 16 16 16   Temp:      TempSrc:      SpO2: 98% 97% 97% 97%  Weight:      Height:        Constitutional: NAD, calm, comfortable Eyes: PERRL, lids and conjunctivae normal ENMT: Mucous membranes are moist. Posterior pharynx clear of any exudate or lesions.Normal dentition.  Neck: Tracheostomy in place. Respiratory: clear to auscultation bilaterally, no wheezing, no crackles. Normal respiratory effort. No accessory muscle use.  Cardiovascular: Regular rate and rhythm, no murmurs / rubs / gallops. No extremity edema. 2+ pedal pulses. No carotid bruits.  Abdomen: no tenderness, no masses palpated. No hepatosplenomegaly. Bowel sounds positive.  Musculoskeletal: no clubbing / cyanosis. No joint deformity upper and lower extremities. Good ROM, no contractures. Normal muscle tone.  Skin: no rashes,  lesions, ulcers. No induration Neurologic: Quadriplegic. Psychiatric: Pt seems AAOx3, can answer yes/no questions accurately, having difficulty understanding patient though due to his chronic vent/trach status and having to try and lip-read patient.   Labs on Admission: I have personally reviewed following labs and imaging studies  CBC: Recent Labs  Lab 07/06/19 1754  WBC 16.6*  HGB 13.1  HCT 41.4  MCV 75.7*  PLT 767   Basic Metabolic Panel: Recent Labs  Lab 07/06/19 1754  NA 141  K 4.0  CL 105  CO2 18*  GLUCOSE 242*  BUN 29*  CREATININE 0.62  CALCIUM 9.5   GFR: Estimated Creatinine Clearance: 78.6 mL/min (by C-G formula based on SCr of 0.62 mg/dL). Liver Function Tests: Recent Labs  Lab 07/06/19 1754  AST 44*  ALT 36  ALKPHOS 86  BILITOT 1.4*  PROT 8.5*  ALBUMIN 3.5   Recent Labs  Lab 07/06/19 1754  LIPASE 26   No results for input(s): AMMONIA in the last 168 hours. Coagulation Profile: Recent Labs  Lab 07/06/19 1922  INR 1.3*   Cardiac Enzymes: No results for input(s): CKTOTAL, CKMB, CKMBINDEX, TROPONINI in the last 168 hours. BNP (last 3 results) No results for input(s): PROBNP in the last 8760 hours. HbA1C: No results for input(s): HGBA1C in the last 72 hours. CBG: No results for input(s): GLUCAP in the last 168 hours. Lipid Profile: No results for input(s): CHOL, HDL, LDLCALC, TRIG, CHOLHDL, LDLDIRECT in the last 72 hours. Thyroid Function Tests: No results for input(s): TSH, T4TOTAL, FREET4, T3FREE, THYROIDAB in the last 72 hours. Anemia Panel: No results for input(s): VITAMINB12, FOLATE, FERRITIN, TIBC, IRON, RETICCTPCT in the last 72 hours. Urine analysis:    Component Value Date/Time   COLORURINE YELLOW 07/06/2019 1925   APPEARANCEUR CLOUDY (A) 07/06/2019 1925   LABSPEC 1.014 07/06/2019 1925   PHURINE 5.0 07/06/2019 1925   GLUCOSEU 150 (A) 07/06/2019 1925   HGBUR SMALL (A) 07/06/2019 1925   BILIRUBINUR NEGATIVE 07/06/2019 1925    KETONESUR 5 (A) 07/06/2019 1925   PROTEINUR 100 (A) 07/06/2019 1925   NITRITE NEGATIVE 07/06/2019 1925   LEUKOCYTESUR LARGE (A) 07/06/2019 1925    Radiological Exams on Admission: CT ABDOMEN PELVIS W CONTRAST  Result Date: 07/06/2019 CLINICAL DATA:  Worsening distension vomiting. EXAM: CT ABDOMEN AND PELVIS WITH CONTRAST TECHNIQUE: Multidetector CT imaging of the abdomen and pelvis was performed using the standard protocol following bolus administration of intravenous contrast. CONTRAST:  181mL OMNIPAQUE IOHEXOL 300 MG/ML  SOLN COMPARISON:  Abdominal CT 05/15/2019 FINDINGS: Lower chest: Again seen dependent bilateral lower lobe airspace consolidations with air bronchograms, progressed from prior exam. Heart is normal in size. Hepatobiliary: No focal liver abnormality is seen. No gallstones, gallbladder wall thickening, or biliary dilatation. Pancreas: No ductal dilatation or inflammation. Spleen: Normal in size without focal abnormality. Adrenals/Urinary Tract: No adrenal nodule. Marked right renal parenchymal atrophy and cortical scarring. Inferior is of perfused cortex in the upper medial and inferior pole. Small cyst in the right kidney. Multiple cysts in the left kidney. Nonobstructing left renal stones. No hydronephrosis of either kidney. No significant perinephric edema. Distended urinary bladder that is displaced anteriorly by large stool burden in the distal colon. No bowel thickening. Stomach/Bowel: Gastrostomy tube appropriately position in the stomach. Stomach is otherwise unremarkable. Scattered air in fluid-filled small bowel in a nonobstructive pattern. No small bowel inflammation. Normal appendix. Mild colonic distension with air and stool. The sigmoid colon is redundant. The distal sigmoid colon is distended with stool within inspissated stool ball distending the rectum. There is rectal distention of 7.2 cm. Mild distal perirectal thickening. No trace perirectal edema. Scattered colonic  diverticula without diverticulitis. Vascular/Lymphatic: Tortuous abdominal aorta with atherosclerosis. No aneurysm. Patent portal vein. No abdominopelvic adenopathy. Reproductive: Prostate is unremarkable. Other: No free air, free fluid, or intra-abdominal fluid collection. Tiny fat containing umbilical hernia. Musculoskeletal: Stable degenerative in chronic change in the spine. Near complete disc space loss at L2-L3 with endplate irregularity is unchanged. IMPRESSION: 1. Large volume of stool in the distal sigmoid colon and rectum with inspissated stool ball in the rectal distension. Mild distal perirectal thickening. Findings suspicious for fecal impaction possible stercoral colitis. Slight improvement in wall thickening from prior. 2. No evidence of bowel obstruction. Scattered air in fluid throughout small bowel that is nondilated may represent subsequent ileus. 3. Distended urinary  bladder that is displaced anteriorly by large stool burden in the distal colon. 4. Progressive dependent bilateral lower lobe airspace consolidations with air bronchograms, suspicious for pneumonia/aspiration. 5. Nonobstructing left renal stones. Marked right renal parenchymal atrophy and cortical scarring. Aortic Atherosclerosis (ICD10-I70.0). Electronically Signed   By: Keith Rake M.D.   On: 07/06/2019 21:49   DG Chest Port 1 View  Result Date: 07/06/2019 CLINICAL DATA:  Sepsis and abdominal pain EXAM: PORTABLE CHEST 1 VIEW COMPARISON:  05/15/2019 FINDINGS: Tracheostomy tube tip is at the level of the clavicular heads. There is bibasilar atelectasis. No pleural effusion or pneumothorax. Normal cardiomediastinal contours. IMPRESSION: Bibasilar atelectasis. Electronically Signed   By: Ulyses Jarred M.D.   On: 07/06/2019 19:20    EKG: Independently reviewed.  Assessment/Plan Principal Problem:   Sepsis secondary to UTI [facility]) Active Problems:   Functional quadriplegia (HCC)   Chronic respiratory failure requiring  continuous mechanical ventilation through tracheostomy ([place])   Fecal impaction (HCC)   HTN (hypertension)   DM2 (diabetes mellitus, type 2) ([place])    1. Sepsis secondary to UTI - 1. Got 1.5 L bolus in ED 2. PCCM wants ABx changed to Merrem + Vanc 3. BCx, UCx 4. If he becomes septic again: get bladder scan 5. Does seem to be able to urinate with just a urinal despite the h/o cord injury. 6. Vitals look good at the moment 2. Fecal impaction - 1. Cont laxatives per PEG 2. Soap suds enema x1 ordered 3. Chronic vent status - 1. Management per PCCM consult 2. ? Of VAP on CT abd/pelvis 3. PCCM wants ABx changed to Merrem+vanc 4. Chronic PEG status - 1. Apparently he gets tube feeds at night, and eats ~1 meal (lunch) a day PO under supervision of SLP 2. SLP to eval and treat 3. Dietary consult, holding TF for tonight, resume tomorrow if having BMs and no further N/V after soapsuds enema tonight. 4. IVF: 1.5L bolus 5. Holding Lasix for the moment 6. Either start TF in AM or start maint fluid in AM 5. HTN - 1. Cont home BP meds 2. But holding lasix as above 6. DM2 - 1. Cont levemir 10 QHS 2. SSI sensitive scale Q4H.  DVT prophylaxis: Lovenox Code Status: Full Family Communication: Spoke with wife on phone Disposition Plan: [facility] after admit Consults called: None Admission status: Place in [place]    Ophelia Sipe, [place] Hospitalists  How to contact the [facility] Attending or Consulting provider [place] or covering provider during after hours [place], for this patient?  1. Check the care team in [facility] and look for a) attending/consulting TR[facility] provider listed and b) the [facility] team listed 2. Log into [email or web address]  Amion Physician Scheduling and messaging for groups and whole hospitals  On call and physician scheduling software for group practices, residents, hospitalists and other medical providers for call, clinic, rotation and shift schedules. OnCall Enterprise is a hospital-wide  system for scheduling doctors and paging doctors on call. EasyPlot is for scientific plotting and data analysis.  [email or web address]  and use [place]'s universal password to access. If you do not have the password, please contact the hospital operator.  3. Locate the [facility] provider you are looking for under [facility] and page to a number that you can be directly reached. 4. If you still have difficulty reaching the provider, please page the [facility] (Director on Call) for the Hospitalists listed on amion for assistance.  07/06/2019, 11:33 PM

## 2019-07-06 NOTE — ED Triage Notes (Addendum)
Pt here from [facility] via for ilieus that is not improving. Pt is a vent dependant trached pt.  Pt is alert and oriented.

## 2019-07-06 NOTE — ED Provider Notes (Signed)
[facility] Emergency Department Provider Note MRN:  [id number]  Arrival date & time: 07/06/19     Chief Complaint   Abdominal Pain (Ileus)   History of Present Illness   Luis Orr is a 69 y.o. year-old male with a history of quadriplegia, chronic respiratory failure presenting to the ED with chief complaint of abdominal pain.  Report of worsening ileus and abdominal distention and vomiting from [facility] long-term care facility.  Patient is trach dependent and cannot make audible words and so history and review of systems is limited.  No family present at this time.  I was unable to obtain an accurate HPI, PMH, or ROS due to the patient's trach dependence.  Level 5 caveat.  Review of Systems  Positive for abdominal pain, vomiting.  Patient's Health History    Past Medical History:  Diagnosis Date   Anemia    Anxiety    Chronic respiratory failure ([place])    Functional quadriplegia ([place])    Hypertension    Urine retention     History reviewed. No pertinent surgical history.  No family history on file.  Social History   Socioeconomic History   Marital status: Married    Spouse name: Not on file   Number of children: Not on file   Years of education: Not on file   Highest education level: Not on file  Occupational History   Not on file  Tobacco Use   Smoking status: Not on file  Substance and Sexual Activity   Alcohol use: Not on file   Drug use: Not on file   Sexual activity: Not on file  Other Topics Concern   Not on file  Social History Narrative   Not on file   Social Determinants of Health   Financial Resource Strain:    Difficulty of Paying Living Expenses:   Food Insecurity:    Worried About [place] in the Last Year:    [place] in the Last Year:   Transportation Needs:    Film/video editor (Medical):    Lack of Transportation (Non-Medical):   Physical Activity:    Days of  Exercise per Week:    Minutes of Exercise per Session:   Stress:    Feeling of Stress :   Social Connections:    Frequency of Communication with Friends and Family:    Frequency of Social Gatherings with Friends and Family:    Attends Religious Services:    Active Member of Clubs or Organizations:    Attends Archivist Meetings:    Marital Status:   Intimate Partner Violence:    Fear of Current or Ex-Partner:    Emotionally Abused:    Physically Abused:    Sexually Abused:      Physical Exam   Vitals:   07/06/19 2100 07/06/19 2115  BP: (!) 122/95 115/90  Pulse: 77 82  Resp: 16 16  Temp:    SpO2: 98% 97%    CONSTITUTIONAL: Chronically ill-appearing, NAD NEURO:  Alert and oriented x 3, no focal deficits EYES:  eyes equal and reactive ENT/NECK:  no LAD, no JVD, tracheostomy tube in place CARDIO: Tachycardic rate, well-perfused, normal S1 and S2 PULM:  CTAB no wheezing or rhonchi GI/GU:  normal bowel sounds, moderate distention, moderate diffuse tenderness MSK/SPINE:  No gross deformities, no edema SKIN:  no rash, atraumatic PSYCH:  Appropriate speech and behavior  *Additional and/or pertinent findings included in MDM below  Diagnostic and Interventional Summary    EKG Interpretation  Date/Time:    Ventricular Rate:    PR Interval:    QRS Duration:   QT Interval:    QTC Calculation:   R Axis:     Text Interpretation:        Labs Reviewed  CBC - Abnormal; Notable for the following components:      Result Value   WBC 16.6 (*)    MCV 75.7 (*)    MCH 23.9 (*)    RDW 16.4 (*)    All other components within normal limits  COMPREHENSIVE METABOLIC PANEL - Abnormal; Notable for the following components:   CO2 18 (*)    Glucose, Bld 242 (*)    BUN 29 (*)    Total Protein 8.5 (*)    AST 44 (*)    Total Bilirubin 1.4 (*)    Anion gap 18 (*)    All other components within normal limits  APTT - Abnormal; Notable for the following  components:   aPTT 42 (*)    All other components within normal limits  PROTIME-INR - Abnormal; Notable for the following components:   Prothrombin Time 15.7 (*)    INR 1.3 (*)    All other components within normal limits  URINALYSIS, ROUTINE W REFLEX MICROSCOPIC - Abnormal; Notable for the following components:   APPearance CLOUDY (*)    Glucose, UA 150 (*)    Hgb urine dipstick SMALL (*)    Ketones, ur 5 (*)    Protein, ur 100 (*)    Leukocytes,Ua LARGE (*)    WBC, UA >50 (*)    Bacteria, UA MANY (*)    All other components within normal limits  CULTURE, BLOOD (SINGLE)  CULTURE, BLOOD (ROUTINE X 2)  CULTURE, BLOOD (ROUTINE X 2)  URINE CULTURE  SARS CORONAVIRUS 2 BY RT PCR (HOSPITAL ORDER, [place] [facility])  LIPASE, BLOOD  LACTIC ACID, PLASMA  LACTIC ACID, PLASMA  LACTIC ACID, PLASMA    CT ABDOMEN PELVIS W CONTRAST  Final Result    DG Chest Port 1 View  Final Result      Medications  sodium chloride 0.9 % bolus 500 mL (0 mLs Intravenous Stopped 07/06/19 2115)  HYDROmorphone (DILAUDID) injection 0.5 mg (0.5 mg Intravenous Given 07/06/19 1857)  ondansetron (ZOFRAN) injection 4 mg (4 mg Intravenous Given 07/06/19 1859)  sodium chloride 0.9 % bolus 1,000 mL (0 mLs Intravenous Stopped 07/06/19 2115)  piperacillin-tazobactam (ZOSYN) IVPB 3.375 g (0 g Intravenous Stopped 07/06/19 2000)  iohexol (OMNIPAQUE) 300 MG/ML solution 100 mL (100 mLs Intravenous Contrast Given 07/06/19 2131)     Procedures  /  Critical Care .Critical Care Performed by: Maudie Flakes, MD Authorized by: Maudie Flakes, MD   Critical care provider statement:    Critical care time (minutes):  34   Critical care was necessary to treat or prevent imminent or life-threatening deterioration of the following conditions:  Sepsis   Critical care was time spent personally by me on the following activities:  Discussions with consultants, evaluation of patient's response to treatment,  examination of patient, ordering and performing treatments and interventions, ordering and review of laboratory studies, ordering and review of radiographic studies, pulse oximetry, re-evaluation of patient's condition, obtaining history from patient or surrogate and review of old charts Ultrasound ED Peripheral IV (Provider)  Date/Time: 07/06/2019 6:51 PM Performed by: Maudie Flakes, MD Authorized by: Maudie Flakes, MD   Procedure  details:    Indications: multiple failed IV attempts     Skin Prep: chlorhexidine gluconate     Location: Right upper arm.   Angiocath:  20 G   Bedside Ultrasound Guided: Yes     Patient tolerated procedure without complications: Yes     Dressing applied: Yes   Ultrasound ED Peripheral IV (Provider)  Date/Time: 07/06/2019 6:51 PM Performed by: Maudie Flakes, MD Authorized by: Maudie Flakes, MD   Procedure details:    Indications: multiple failed IV attempts     Skin Prep: chlorhexidine gluconate     Location: Left upper arm.   Angiocath:  20 G   Bedside Ultrasound Guided: Yes     Patient tolerated procedure without complications: Yes     Dressing applied: Yes      ED Course and Medical Decision Making  I have reviewed the triage vital signs, the nursing notes, and pertinent available records from the EMR.  Listed above are laboratory and imaging tests that I personally ordered, reviewed, and interpreted and then considered in my medical decision making (see below for details).      Concern for worsening ileus, possibly bowel obstruction, patient is reportedly vomiting, he is tachycardic here, normotensive on my evaluation 956 systolic, he has diffuse abdominal tenderness, will proceed with CT imaging.  6:30 PM update: Patient clinically appears worse, seems more altered, more diaphoretic, more tachycardic, monitor appears to be A. fib with RVR.  Now has fever.  Code sepsis initiated, difficult access patient but 2 IVs obtained with the help  of ultrasound guidance.  Providing further fluid resuscitation as well as IV Zosyn empirically, still awaiting CT imaging.  CT imaging reveals stercoral colitis and possible fecal impaction, though patient has passed a formed stool here in the emergency department.  UTI also evident on labs.  Will admit to the hospital, discussed with hospitalist service, given that patient is chronically on the vent will need the intensivist service.  Barth Kirks. [place] Small, [place] [email or web address]  Final Clinical Impressions(s) / ED Diagnoses     ICD-10-CM   1. Colitis  K52.9   2. Lower urinary tract infectious disease  N39.0   3. Sepsis, due to unspecified organism, unspecified whether acute organ dysfunction present [facility])  A41.9     ED Discharge Orders    None       Discharge Instructions Discussed with and Provided to Patient:   Discharge Instructions   None       Maudie Flakes, MD 07/06/19 2300

## 2019-07-06 NOTE — Progress Notes (Signed)
Patient transported to CT and back without complications.  

## 2019-07-06 NOTE — ED Notes (Signed)
IV team placing a mid line.

## 2019-07-06 NOTE — Progress Notes (Signed)
Pharmacy Antibiotic Note  Luis Orr is a 69 y.o. male admitted on 07/06/2019 with Intra-abdominal infection.  Pharmacy has been consulted for Zosyn dosing. WBC up at 16.6. SCr wnl.   Plan: -Start Zosyn 3.375 gm IV Q 8 hours -Monitor CBC, renal fx, cultures and clinical progress  Height: 5\' 6"  (167.6 cm) Weight: 74.8 kg (165 lb) IBW/kg (Calculated) : 63.8  Temp (24hrs), Avg:99.4 F (37.4 C), Min:98.2 F (36.8 C), Max:100.6 F (38.1 C)  Recent Labs  Lab 07/06/19 1754 07/06/19 1849 07/06/19 1851  WBC 16.6*  --   --   CREATININE 0.62  --   --   LATICACIDVEN  --  1.4 1.4    Estimated Creatinine Clearance: 78.6 mL/min (by C-G formula based on SCr of 0.62 mg/dL).    No Known Allergies  Antimicrobials this admission: Zosyn 6/23 >>   Dose adjustments this admission:   Microbiology results: 6/23 BCx:  6/23 UCx:     Thank you for allowing pharmacy to be a part of this patient's care.  Albertina Parr, PharmD., BCPS, BCCCP Clinical Pharmacist Clinical phone for 07/06/19 until 11:30pm: [phone] If after 11:30pm, please refer to [facility] for unit-specific pharmacist

## 2019-07-06 NOTE — ED Notes (Signed)
RN and Resp therapist to CT

## 2019-07-07 DIAGNOSIS — J9610 Chronic respiratory failure, unspecified whether with hypoxia or hypercapnia: Secondary | ICD-10-CM

## 2019-07-07 DIAGNOSIS — Z794 Long term (current) use of insulin: Secondary | ICD-10-CM | POA: Diagnosis not present

## 2019-07-07 DIAGNOSIS — L89153 Pressure ulcer of sacral region, stage 3: Secondary | ICD-10-CM | POA: Diagnosis present

## 2019-07-07 DIAGNOSIS — J9509 Other tracheostomy complication: Secondary | ICD-10-CM | POA: Diagnosis not present

## 2019-07-07 DIAGNOSIS — A419 Sepsis, unspecified organism: Secondary | ICD-10-CM | POA: Diagnosis not present

## 2019-07-07 DIAGNOSIS — E118 Type 2 diabetes mellitus with unspecified complications: Secondary | ICD-10-CM | POA: Diagnosis present

## 2019-07-07 DIAGNOSIS — B958 Unspecified staphylococcus as the cause of diseases classified elsewhere: Secondary | ICD-10-CM

## 2019-07-07 DIAGNOSIS — Z9911 Dependence on respirator [ventilator] status: Secondary | ICD-10-CM | POA: Diagnosis not present

## 2019-07-07 DIAGNOSIS — Z930 Tracheostomy status: Secondary | ICD-10-CM

## 2019-07-07 DIAGNOSIS — G9520 Unspecified cord compression: Secondary | ICD-10-CM | POA: Diagnosis present

## 2019-07-07 DIAGNOSIS — K5289 Other specified noninfective gastroenteritis and colitis: Secondary | ICD-10-CM | POA: Diagnosis not present

## 2019-07-07 DIAGNOSIS — Y848 Other medical procedures as the cause of abnormal reaction of the patient, or of later complication, without mention of misadventure at the time of the procedure: Secondary | ICD-10-CM | POA: Diagnosis present

## 2019-07-07 DIAGNOSIS — J95851 Ventilator associated pneumonia: Secondary | ICD-10-CM | POA: Diagnosis present

## 2019-07-07 DIAGNOSIS — Z79899 Other long term (current) drug therapy: Secondary | ICD-10-CM | POA: Diagnosis not present

## 2019-07-07 DIAGNOSIS — I10 Essential (primary) hypertension: Secondary | ICD-10-CM | POA: Diagnosis present

## 2019-07-07 DIAGNOSIS — R6510 Systemic inflammatory response syndrome (SIRS) of non-infectious origin without acute organ dysfunction: Secondary | ICD-10-CM | POA: Diagnosis not present

## 2019-07-07 DIAGNOSIS — B961 Klebsiella pneumoniae [K. pneumoniae] as the cause of diseases classified elsewhere: Secondary | ICD-10-CM | POA: Diagnosis present

## 2019-07-07 DIAGNOSIS — R109 Unspecified abdominal pain: Secondary | ICD-10-CM | POA: Diagnosis present

## 2019-07-07 DIAGNOSIS — F419 Anxiety disorder, unspecified: Secondary | ICD-10-CM | POA: Diagnosis not present

## 2019-07-07 DIAGNOSIS — N390 Urinary tract infection, site not specified: Secondary | ICD-10-CM | POA: Diagnosis present

## 2019-07-07 DIAGNOSIS — R532 Functional quadriplegia: Secondary | ICD-10-CM | POA: Diagnosis present

## 2019-07-07 DIAGNOSIS — Z20822 Contact with and (suspected) exposure to covid-19: Secondary | ICD-10-CM | POA: Diagnosis present

## 2019-07-07 DIAGNOSIS — Y838 Other surgical procedures as the cause of abnormal reaction of the patient, or of later complication, without mention of misadventure at the time of the procedure: Secondary | ICD-10-CM | POA: Diagnosis not present

## 2019-07-07 DIAGNOSIS — A4159 Other Gram-negative sepsis: Secondary | ICD-10-CM | POA: Diagnosis present

## 2019-07-07 DIAGNOSIS — K567 Ileus, unspecified: Secondary | ICD-10-CM | POA: Diagnosis present

## 2019-07-07 DIAGNOSIS — Z79891 Long term (current) use of opiate analgesic: Secondary | ICD-10-CM | POA: Diagnosis not present

## 2019-07-07 DIAGNOSIS — Z931 Gastrostomy status: Secondary | ICD-10-CM | POA: Diagnosis not present

## 2019-07-07 DIAGNOSIS — E876 Hypokalemia: Secondary | ICD-10-CM | POA: Diagnosis present

## 2019-07-07 DIAGNOSIS — R4182 Altered mental status, unspecified: Secondary | ICD-10-CM | POA: Diagnosis not present

## 2019-07-07 DIAGNOSIS — K5641 Fecal impaction: Secondary | ICD-10-CM | POA: Diagnosis present

## 2019-07-07 DIAGNOSIS — R7881 Bacteremia: Secondary | ICD-10-CM

## 2019-07-07 DIAGNOSIS — K529 Noninfective gastroenteritis and colitis, unspecified: Secondary | ICD-10-CM | POA: Diagnosis not present

## 2019-07-07 LAB — CBC
HCT: 31 % — ABNORMAL LOW (ref 39.0–52.0)
Hemoglobin: 10.5 g/dL — ABNORMAL LOW (ref 13.0–17.0)
MCH: 24.7 pg — ABNORMAL LOW (ref 26.0–34.0)
MCHC: 33.9 g/dL (ref 30.0–36.0)
MCV: 72.9 fL — ABNORMAL LOW (ref 80.0–100.0)
Platelets: 316 10*3/uL (ref 150–400)
RBC: 4.25 MIL/uL (ref 4.22–5.81)
RDW: 15.9 % — ABNORMAL HIGH (ref 11.5–15.5)
WBC: 14.6 10*3/uL — ABNORMAL HIGH (ref 4.0–10.5)
nRBC: 0 % (ref 0.0–0.2)

## 2019-07-07 LAB — I-STAT ARTERIAL BLOOD GAS, ED
Acid-Base Excess: 2 mmol/L (ref 0.0–2.0)
Bicarbonate: 26.2 mmol/L (ref 20.0–28.0)
Calcium, Ion: 1.27 mmol/L (ref 1.15–1.40)
HCT: 34 % — ABNORMAL LOW (ref 39.0–52.0)
Hemoglobin: 11.6 g/dL — ABNORMAL LOW (ref 13.0–17.0)
O2 Saturation: 97 %
Patient temperature: 99.2
Potassium: 3.2 mmol/L — ABNORMAL LOW (ref 3.5–5.1)
Sodium: 146 mmol/L — ABNORMAL HIGH (ref 135–145)
TCO2: 27 mmol/L (ref 22–32)
pCO2 arterial: 39.4 mmHg (ref 32.0–48.0)
pH, Arterial: 7.432 (ref 7.350–7.450)
pO2, Arterial: 94 mmHg (ref 83.0–108.0)

## 2019-07-07 LAB — BLOOD CULTURE ID PANEL (REFLEXED)

## 2019-07-07 LAB — HIV ANTIBODY (ROUTINE TESTING W REFLEX): HIV Screen 4th Generation wRfx: NONREACTIVE

## 2019-07-07 LAB — LACTIC ACID, PLASMA: Lactic Acid, Venous: 0.9 mmol/L (ref 0.5–1.9)

## 2019-07-07 LAB — BASIC METABOLIC PANEL
Anion gap: 12 (ref 5–15)
BUN: 23 mg/dL (ref 8–23)
CO2: 23 mmol/L (ref 22–32)
Calcium: 9 mg/dL (ref 8.9–10.3)
Chloride: 108 mmol/L (ref 98–111)
Creatinine, Ser: 0.47 mg/dL — ABNORMAL LOW (ref 0.61–1.24)
GFR calc Af Amer: >60 mL/min (ref >60)
GFR calc non Af Amer: >60 mL/min (ref >60)
Glucose, Bld: 174 mg/dL — ABNORMAL HIGH (ref 70–99)
Potassium: 3.1 mmol/L — ABNORMAL LOW (ref 3.5–5.1)
Sodium: 143 mmol/L (ref 135–145)

## 2019-07-07 LAB — CBG MONITORING, ED
Glucose-Capillary: 161 mg/dL — ABNORMAL HIGH (ref 70–99)
Glucose-Capillary: 94 mg/dL (ref 70–99)
Glucose-Capillary: 112 mg/dL — ABNORMAL HIGH (ref 70–99)
Glucose-Capillary: 127 mg/dL — ABNORMAL HIGH (ref 70–99)

## 2019-07-07 LAB — HEMOGLOBIN A1C
Hgb A1c MFr Bld: 7 % — ABNORMAL HIGH (ref 4.8–5.6)
Mean Plasma Glucose: 154.2 mg/dL

## 2019-07-07 LAB — SARS CORONAVIRUS 2 BY RT PCR (HOSPITAL ORDER, PERFORMED IN ~~LOC~~ HOSPITAL LAB): SARS Coronavirus 2: NEGATIVE

## 2019-07-07 LAB — GLUCOSE, CAPILLARY: Glucose-Capillary: 107 mg/dL — ABNORMAL HIGH (ref 70–99)

## 2019-07-07 MED ORDER — VANCOMYCIN HCL IN DEXTROSE 1-5 GM/200ML-% IV SOLN
1000.0000 mg | Freq: Two times a day (BID) | INTRAVENOUS | Status: DC
  Administered 2019-07-07 – 2019-07-09 (×4): 1000 mg via INTRAVENOUS
  Filled 2019-07-07 (×4): qty 200

## 2019-07-07 MED ORDER — GABAPENTIN 250 MG/5ML PO SOLN
300.0000 mg | Freq: Two times a day (BID) | ORAL | Status: DC
  Administered 2019-07-07 (×3): 300 mg via ORAL
  Filled 2019-07-07 (×6): qty 6

## 2019-07-07 MED ORDER — VANCOMYCIN HCL 1500 MG/300ML IV SOLN
1500.0000 mg | INTRAVENOUS | Status: AC
  Administered 2019-07-07: 1500 mg via INTRAVENOUS
  Filled 2019-07-07: qty 300

## 2019-07-07 MED ORDER — SODIUM CHLORIDE 0.9 % IV SOLN
1.0000 g | Freq: Three times a day (TID) | INTRAVENOUS | Status: DC
  Administered 2019-07-07 – 2019-07-12 (×17): 1 g via INTRAVENOUS
  Filled 2019-07-07 (×3): qty 1
  Filled 2019-07-07 (×2): qty 0.03
  Filled 2019-07-07 (×4): qty 1
  Filled 2019-07-07 (×2): qty 0.03
  Filled 2019-07-07 (×7): qty 1

## 2019-07-07 MED ORDER — SODIUM CHLORIDE 0.9 % IV SOLN: INTRAVENOUS | Status: DC

## 2019-07-07 MED ORDER — POTASSIUM CHLORIDE 10 MEQ/100ML IV SOLN: 10.0000 meq | INTRAVENOUS | Status: DC

## 2019-07-07 MED ORDER — DOCUSATE SODIUM 50 MG/5ML PO LIQD
100.0000 mg | Freq: Two times a day (BID) | ORAL | Status: DC
  Administered 2019-07-07 – 2019-07-08 (×4): 100 mg via ORAL
  Filled 2019-07-07 (×6): qty 10

## 2019-07-07 MED ORDER — POTASSIUM CHLORIDE 20 MEQ PO PACK
60.0000 meq | PACK | Freq: Once | ORAL | Status: AC
  Administered 2019-07-07: 60 meq
  Filled 2019-07-07: qty 3

## 2019-07-07 NOTE — Progress Notes (Signed)
Patient transported to [id number] without complications.

## 2019-07-07 NOTE — H&P (Signed)
NAME:  Luis Orr, MRN:  [id number], DOB:  12-22-1950, LOS: 0 ADMISSION DATE:  07/06/2019, CONSULTATION DATE:  6/24 REFERRING MD:  Dr. Alejandro Mulling, CHIEF COMPLAINT: Abdominal pain  Brief History   69 year old male trach/vent dependent from [facility] with abdominal pain. Found to have fecal impaction, UTI, pneumonia. Admitted to hospitalists service. PCCM consult for vent.   History of present illness   Patient is encephalopathic and/or intubated. Therefore history has been obtained from chart review.   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 He has been vent dependent since 01/2017 and resides at [facility]. Recent course has been complicated by abdominal pain and distention. He has been diagnosed with ileus and has been an ongoing issue for what sounds like more than one week. On 6/23 abdominal distension became worse.  Xray of the abdomen was performed and indicated worsening ileus. He was noted to have altered mental status at the SNF and was transferred to [facility] ED 6/23 in this context. Workup in the ED was significant for CT abdomen demonstrating lower lobe consolidation, fecal impaction, and stercoral colitis. Laboratory evaluation significant for WBC 16.6, CO2 18, and Glucose 242. UA concerning for infection. He was treated with empiric antibiotics and was admitted to the hospitalists service for sepsis, UTI, Pneumonia,  And fecal impaction. PCCM was asked to see for ventilator management.   Past Medical History   has a past medical history of Anemia, Anxiety, Chronic respiratory failure ([place]), DM2 (diabetes mellitus, type 2) ([place]), Functional quadriplegia ([place]), Hypertension, and Urine retention.  Significant Hospital Events   6/23 admit  Consults:  PCCM  Procedures:  Trach pre hospital #6  Significant Diagnostic Tests:  CT abdomen 6/24 > Fecal impaction possible stercoral colitis. No  evidence of bowel obstruction. Scattered air in fluid throughout small bowel that is nondilated may represent subsequent ileus. Distended urinary bladder. Progressive dependent bilateral lower lobe airspace consolidations with air bronchograms, suspicious for pneumonia/aspiration  Micro Data:  Blood 6/23 > Urine 6/23 >  Blood 6/23 >  Antimicrobials:   Zosyn 6/23 Vancomycin 6/24 > Meropenem 6/24 >  Interim history/subjective:  NA  Objective   Blood pressure 115/90, pulse 78, temperature (!) 100.6 F (38.1 C), temperature source Axillary, resp. rate 16, height 5\' 6"  (1.676 m), weight 74.8 kg, SpO2 97 %.    Vent Mode: PRVC FiO2 (%):  [30 %] 30 % Set Rate:  [16 bmp] 16 bmp Vt Set:  [500 mL] 500 mL PEEP:  [5 cmH20] 5 cmH20 Plateau Pressure:  [16 cmH20-20 cmH20] 16 cmH20   Intake/Output Summary (Last 24 hours) at 07/07/2019 0011 Last data filed at 07/07/2019 0002 Gross per 24 hour  Intake --  Output 850 ml  Net -850 ml   Filed Weights   07/06/19 1657 07/06/19 1659  Weight: 65.8 kg 74.8 kg    Examination: General: Patient in no acute distress HENT: Trach in place appears clean Lungs: Coarse mechanical breath sounds Cardiovascular: RRR Abdomen: Diffusely tender soft with minimal duistension Extremities: Unable to move lower extremities.  Neuro: Alert and interactive makes good eye contact. Able to follow simple commands GU: Diaper in place. Minimal sacral breakdown noted maybe stage 2  Resolved Hospital Problem list   NA  Assessment & Plan:  This is a 69 yo with history as noted above who presents for concerns for sepsis. PCCM consulted for vent management.    Chronic respiratory failure-2/2  to compressive myelopathy has been vent dependant since 2018. Is on assist control Fio2 28% TV 500, peep 5 and Rate of 16 at facility.  -Continue PRVC here at 30% Peep 5 Rate 16 and TV 500 -Attain ABG -Attain sputum cultures -Would cover for potential MRSA and PA for ventilator  acquired pna as he does bilateral lower lobe consolidations in CT. Ventilator pna however seems less likely given that he does not have increase in vent setting   Sepsis- -Agree with broad spectrum abx for now given patients risk factors -Will need aggressive bowel regimen for srcoral colits.  -FU UCX, BCX, and sputum culture             Best practice:    Labs   CBC: Recent Labs  Lab 07/06/19 1754  WBC 16.6*  HGB 13.1  HCT 41.4  MCV 75.7*  PLT 810    Basic Metabolic Panel: Recent Labs  Lab 07/06/19 1754  NA 141  K 4.0  CL 105  CO2 18*  GLUCOSE 242*  BUN 29*  CREATININE 0.62  CALCIUM 9.5   GFR: Estimated Creatinine Clearance: 78.6 mL/min (by C-G formula based on SCr of 0.62 mg/dL). Recent Labs  Lab 07/06/19 1754 07/06/19 1849 07/06/19 1851  WBC 16.6*  --   --   LATICACIDVEN  --  1.4 1.4    Liver Function Tests: Recent Labs  Lab 07/06/19 1754  AST 44*  ALT 36  ALKPHOS 86  BILITOT 1.4*  PROT 8.5*  ALBUMIN 3.5   Recent Labs  Lab 07/06/19 1754  LIPASE 26   No results for input(s): AMMONIA in the last 168 hours.  ABG    Component Value Date/Time   PHART 7.453 (H) 05/15/2019 0709   PCO2ART 38.8 05/15/2019 0709   PO2ART 86 05/15/2019 0709   HCO3 27.2 05/15/2019 0709   TCO2 28 05/15/2019 0709   O2SAT 97.0 05/15/2019 0709     Coagulation Profile: Recent Labs  Lab 07/06/19 1922  INR 1.3*    Cardiac Enzymes: No results for input(s): CKTOTAL, CKMB, CKMBINDEX, TROPONINI in the last 168 hours.  HbA1C: No results found for: HGBA1C  CBG: No results for input(s): GLUCAP in the last 168 hours.  Review of Systems:   Pertinent positives and negatives per HPI  Past Medical History  He,  has a past medical history of Anemia, Anxiety, Chronic respiratory failure ([place]), DM2 (diabetes mellitus, type 2) ([place]), Functional quadriplegia ([place]), Hypertension, and Urine retention.   Surgical History    Past Surgical History:  Procedure  Laterality Date  . PEG PLACEMENT    . ROTATOR CUFF REPAIR    . TRACHEOSTOMY       Social History      Family History   His family history is not on file.   Allergies No Known Allergies   Home Medications  Prior to Admission medications   Medication Sig Start Date End Date Taking? Authorizing Provider  amLODipine (NORVASC) 10 MG tablet Take 10 mg by mouth daily.   Yes [provider]  benzonatate (TESSALON) 100 MG capsule Take 100 mg by mouth 3 (three) times daily as needed for cough.   Yes [provider]  carboxymethylcellulose (REFRESH PLUS) 0.5 % SOLN Place 1 drop into both eyes as needed (lubricant).   Yes [provider]  chlorhexidine (PERIDEX) 0.12 % solution Use as directed 5 mLs in the mouth or throat 2 (two) times daily.   Yes [provider]  clonazePAM (KLONOPIN) 0.5  MG tablet Take 0.5 mg by mouth every 8 (eight) hours.   Yes [provider]  cloNIDine (CATAPRES) 0.1 MG tablet Take 0.1 mg by mouth every 8 (eight) hours.   Yes [provider]  docusate sodium (COLACE) 100 MG capsule Take 100 mg by mouth every 12 (twelve) hours.   Yes [provider]  famotidine (PEPCID) 40 MG tablet Take 20 mg by mouth at bedtime.   Yes [provider]  fentaNYL (DURAGESIC) 25 MCG/HR Place 1 patch onto the skin every 3 (three) days.   Yes [provider]  furosemide (LASIX) 40 MG tablet Take 40 mg by mouth daily.   Yes [provider]  gabapentin (NEURONTIN) 300 MG capsule Take 300 mg by mouth 2 (two) times daily.   Yes [provider]  hydrALAZINE (APRESOLINE) 25 MG tablet Take 25 mg by mouth See admin instructions. Taking every 4 hours if SBP > 160 as needed   Yes [provider]  HYDROcodone-acetaminophen (NORCO) 7.5-325 MG tablet Take 1 tablet by mouth in the morning and at bedtime.    Yes [provider]  insulin detemir (LEVEMIR) 100 UNIT/ML injection Inject 10 Units  into the skin at bedtime.   Yes [provider]  insulin regular (NOVOLIN R) 100 units/mL injection Inject 2-10 Units into the skin in the morning, at noon, in the evening, and at bedtime. Per sliding scale:  151-200 = 2 units, 201-250= 4 units, 251-300= 6 units, 301-350= 8 units, 351-400= 10 units.   Yes [provider]  ipratropium-albuterol (DUONEB) 0.5-2.5 (3) MG/3ML SOLN Take 3 mLs by nebulization daily as needed (wheezing).    Yes [provider]  loperamide (IMODIUM A-D) 2 MG tablet Take 2 mg by mouth every 4 (four) hours as needed for diarrhea or loose stools.   Yes [provider]  Multiple Vitamins-Minerals (MULTIVITAMIN WITH MINERALS) tablet Take 1 tablet by mouth daily.   Yes [provider]  Olopatadine HCl 0.2 % SOLN Place 1 drop into both eyes daily.   Yes [provider]  polyethylene glycol (MIRALAX / GLYCOLAX) 17 g packet Take 17 g by mouth daily as needed for mild constipation.   Yes [provider]  potassium chloride 20 MEQ/15ML (10%) SOLN Take 40 mEq by mouth daily.   Yes [provider]  senna (SENOKOT) 8.6 MG TABS tablet Take 1 tablet by mouth at bedtime.   Yes [provider]  sertraline (ZOLOFT) 100 MG tablet Take 100 mg by mouth at bedtime.   Yes [provider]  simethicone (MYLICON) 80 MG chewable tablet Chew 80 mg by mouth every 8 (eight) hours as needed for flatulence.   Yes [provider]     Critical care time: 45 minutes

## 2019-07-07 NOTE — Progress Notes (Signed)
PCCM recommended changing ABx from zosyn to [place] and Vanc instead.  Pt with h/o MRSA+, pt was just on Merrem a few weeks ago apparently, no cultures available though.

## 2019-07-07 NOTE — ED Notes (Signed)
Pt has a stage two pressure ulcer.  Pt had a large bowl movement. Complete lien change. Pt was given bed bath.

## 2019-07-07 NOTE — Progress Notes (Signed)
PROGRESS NOTE    Luis Orr  TML:[id number] DOB: 1950/03/14 DOA: 07/06/2019 PCP: Townsend Roger, MD     Brief Narrative:  69 y.o. BM PMHx   anxiety, diabetes type 2 controlled with complication, HTN, urine retention, chronic respiratory failure on vent.  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  Pt is Vent dependent as a result since end of 2018.  Pt has been residing in [facility] on chronic vent since Jan 2019.  Despite the quadriplegia and respiratory failure, pt is apparently able to urinate without the use of a catheter.  Pt sent in to the ED today with c/o abd pain.  Concern for worsening abd distention and "ileus".  Patient seems AAOx3, but history taking is difficult secondary to pt being on chronic mechanical ventilator and unable to make audible words (have to lip read patient).  Thus most of the history is confirmed from his wife.   Subjective: T-max last 24 hours 38.1 C, alert, nods yes and no to questions, follows commands.  Chronic trach in place, chronic PEG tube in place negative CP, negative abdominal pain.   Assessment & Plan: Covid vaccination;??   Principal Problem:   Sepsis secondary to UTI [facility]) Active Problems:   Functional quadriplegia (HCC)   Chronic respiratory failure requiring continuous mechanical ventilation through tracheostomy ([place])   Fecal impaction (HCC)   HTN (hypertension)   DM2 (diabetes mellitus, type 2) (HCC)  Staph bacteremia -Positive methicillin-resistant Staphylococcus awaiting speciation -Continue current antibiotic regimen  Sepsis UTI -Continue meropenem plus+ vancomycin per PCCM recommendation. -Cultures pending -Normal saline 63ml/hr  Chronic ventilator -Per PCCM  Fecal impaction  -Per patient resolved -Continue n.p.o. if patient does well overnight will start with full liquid diet per PEG tube in a.m.  Essential HTN -Amlodipine 10 mg daily -Clonidine 0.1 mg TID  Diabetes type 2 hold  with complication -6/81 hemoglobin A1c 7.0 -Levemir 10 units daily -Sensitive SSI  Hypokalemia -Potassium per Peg 60 mEq     DVT prophylaxis: Lovenox Code Status: Full Family Communication:  Status is: Inpatient    Dispo: The patient is from: [facility]              Anticipated d/c is to: [facility]              Anticipated d/c date is: 6/30              Patient currently unstable staph bacteremia      Consultants:  PCCM   Procedures/Significant Events:     I have personally reviewed and interpreted all radiology studies and my findings are as above.  VENTILATOR SETTINGS: Mode; PRVC 6/24 Vt; 573ml Set rate; 16 FiO2; 30% PEEP; 5 SPO2; 99%   Cultures 6/23 blood positive Staphylococcus methicillin-resistant 6/23 blood NGTD 6/24 tracheal aspirate NGTD    Antimicrobials: Anti-infectives (From admission, onward)   Start     Ordered Stop   07/07/19 1300  vancomycin (VANCOCIN) IVPB 1000 mg/200 mL premix     Discontinue     07/07/19 0048     07/07/19 0200  meropenem (MERREM) 1 g in sodium chloride 0.9 % 100 mL IVPB     Discontinue     07/07/19 0048     07/07/19 0100  piperacillin-tazobactam (ZOSYN) IVPB 3.375 g  Status:  Discontinued        07/06/19 2304 07/07/19 0041   07/07/19 0100  vancomycin (VANCOREADY) IVPB 1500 mg/300 mL        07/07/19 0048 07/07/19 0343  07/06/19 1900  piperacillin-tazobactam (ZOSYN) IVPB 3.375 g        07/06/19 1849 07/06/19 2000       Devices    LINES / TUBES:      Continuous Infusions: . meropenem (MERREM) IV Stopped (07/07/19 0357)  . vancomycin       Objective: Vitals:   07/07/19 0809 07/07/19 1000 07/07/19 1200 07/07/19 1235  BP: 103/74 133/75 137/84   Pulse:  85 70   Resp:  16 14   Temp:      TempSrc:      SpO2: 100% 98% 98% 98%  Weight:      Height:        Intake/Output Summary (Last 24 hours) at 07/07/2019 1303 Last data filed at 07/07/2019 0002 Gross per 24 hour  Intake --  Output 850 ml  Net  -850 ml   Filed Weights   07/06/19 1657 07/06/19 1659  Weight: 65.8 kg 74.8 kg    Examination:  General: Sleepy but arousable, positive chronic respiratory distress (vent dependent) Eyes: negative scleral hemorrhage, negative anisocoria, negative icterus ENT: Negative Runny nose, negative gingival bleeding, Neck:  Negative scars, masses, torticollis, lymphadenopathy, JVD #6 cuffed [place] in place covered and clean negative sign of infection Lungs: Clear to auscultation bilaterally without wheezes or crackles Cardiovascular: Regular rate and rhythm without murmur gallop or rub normal S1 and S2 Abdomen: negative abdominal pain, nondistended, positive soft, bowel sounds, no rebound, no ascites, no appreciable mass, PEG tube in place covered and clean no sign of infection. Extremities: All extremities 1-2+ edema Skin: Negative rashes, lesions, ulcers Psychiatric: Unable to assess secondary to chronic vent Central nervous system: Patient nods yes and no to questions, also will speak to you but you must read his lips.  Spontaneously moves bilateral upper extremities  .     Data Reviewed: Care during the described time interval was provided by me .  I have reviewed this patient's available data, including medical history, events of note, physical examination, and all test results as part of my evaluation.  CBC: Recent Labs  Lab 07/06/19 1754 07/07/19 0104 07/07/19 0400  WBC 16.6*  --  14.6*  HGB 13.1 11.6* 10.5*  HCT 41.4 34.0* 31.0*  MCV 75.7*  --  72.9*  PLT 264  --  254   Basic Metabolic Panel: Recent Labs  Lab 07/06/19 1754 07/07/19 0104 07/07/19 0400  NA 141 146* 143  K 4.0 3.2* 3.1*  CL 105  --  108  CO2 18*  --  23  GLUCOSE 242*  --  174*  BUN 29*  --  23  CREATININE 0.62  --  0.47*  CALCIUM 9.5  --  9.0   GFR: Estimated Creatinine Clearance: 78.6 mL/min (A) (by C-G formula based on SCr of 0.47 mg/dL (L)). Liver Function Tests: Recent Labs  Lab 07/06/19 1754   AST 44*  ALT 36  ALKPHOS 86  BILITOT 1.4*  PROT 8.5*  ALBUMIN 3.5   Recent Labs  Lab 07/06/19 1754  LIPASE 26   No results for input(s): AMMONIA in the last 168 hours. Coagulation Profile: Recent Labs  Lab 07/06/19 1922  INR 1.3*   Cardiac Enzymes: No results for input(s): CKTOTAL, CKMB, CKMBINDEX, TROPONINI in the last 168 hours. BNP (last 3 results) No results for input(s): PROBNP in the last 8760 hours. HbA1C: Recent Labs    07/07/19 0400  HGBA1C 7.0*   CBG: Recent Labs  Lab 07/07/19 0223 07/07/19 0756 07/07/19 1253  GLUCAP 161* 94 112*   Lipid Profile: No results for input(s): CHOL, HDL, LDLCALC, TRIG, CHOLHDL, LDLDIRECT in the last 72 hours. Thyroid Function Tests: No results for input(s): TSH, T4TOTAL, FREET4, T3FREE, THYROIDAB in the last 72 hours. Anemia Panel: No results for input(s): VITAMINB12, FOLATE, FERRITIN, TIBC, IRON, RETICCTPCT in the last 72 hours. Sepsis Labs: Recent Labs  Lab 07/06/19 1849 07/06/19 1851 07/07/19 0400  LATICACIDVEN 1.4 1.4 0.9    Recent Results (from the past 240 hour(s))  Blood Culture (routine x 2)     Status: None (Preliminary result)   Collection Time: 07/06/19  6:53 PM   Specimen: Site Not Specified; Blood  Result Value Ref Range Status   Specimen Description SITE NOT SPECIFIED  Final   Special Requests   Final    BOTTLES DRAWN AEROBIC AND ANAEROBIC Blood Culture adequate volume   Culture   Final    NO GROWTH < 12 HOURS Performed at [facility], [address] [place], [place] [address]    Report Status PENDING  Incomplete  Blood Culture (routine x 2)     Status: None (Preliminary result)   Collection Time: 07/06/19  6:57 PM   Specimen: Site Not Specified; Blood  Result Value Ref Range Status   Specimen Description SITE NOT SPECIFIED  Final   Special Requests   Final    BOTTLES DRAWN AEROBIC AND ANAEROBIC Blood Culture adequate volume   Culture   Final    NO GROWTH < 12 HOURS Performed at  [facility], [place] [address], [place], [place] [address]    Report Status PENDING  Incomplete  SARS Coronavirus 2 by RT PCR (hospital order, performed in [facility] hospital lab) Nasopharyngeal Nasopharyngeal Swab     Status: None   Collection Time: 07/06/19 10:30 PM   Specimen: Nasopharyngeal Swab  Result Value Ref Range Status   SARS Coronavirus 2 NEGATIVE NEGATIVE Final    Comment: (NOTE) SARS-CoV-2 target nucleic acids are NOT DETECTED.  The SARS-CoV-2 RNA is generally detectable in upper and lower respiratory specimens during the acute phase of infection. The lowest concentration of SARS-CoV-2 viral copies this assay can detect is 250 copies / mL. A negative result does not preclude SARS-CoV-2 infection and should not be used as the sole basis for treatment or other patient management decisions.  A negative result may occur with improper specimen collection / handling, submission of specimen other than nasopharyngeal swab, presence of viral mutation(s) within the areas targeted by this assay, and inadequate number of viral copies (<250 copies / mL). A negative result must be combined with clinical observations, patient history, and epidemiological information.  Fact Sheet for Patients:   StrictlyIdeas.no  Fact Sheet for Healthcare Providers: BankingDealers.co.za  This test is not yet approved or  cleared by the [place] FDA and has been authorized for detection and/or diagnosis of SARS-CoV-2 by FDA under an Emergency Use Authorization (EUA).  This EUA will remain in effect (meaning this test can be used) for the duration of the COVID-19 declaration under Section 564(b)(1) of the Act, 21 U.S.C. section 360bbb-3(b)(1), unless the authorization is terminated or revoked sooner.  Performed at [facility], [place] [address], [place], [place] [address]          Radiology Studies: CT ABDOMEN PELVIS W  CONTRAST  Result Date: 07/06/2019 CLINICAL DATA:  Worsening distension vomiting. EXAM: CT ABDOMEN AND PELVIS WITH CONTRAST TECHNIQUE: Multidetector CT imaging of the abdomen and pelvis was  performed using the standard protocol following bolus administration of intravenous contrast. CONTRAST:  168mL OMNIPAQUE IOHEXOL 300 MG/ML  SOLN COMPARISON:  Abdominal CT 05/15/2019 FINDINGS: Lower chest: Again seen dependent bilateral lower lobe airspace consolidations with air bronchograms, progressed from prior exam. Heart is normal in size. Hepatobiliary: No focal liver abnormality is seen. No gallstones, gallbladder wall thickening, or biliary dilatation. Pancreas: No ductal dilatation or inflammation. Spleen: Normal in size without focal abnormality. Adrenals/Urinary Tract: No adrenal nodule. Marked right renal parenchymal atrophy and cortical scarring. Inferior is of perfused cortex in the upper medial and inferior pole. Small cyst in the right kidney. Multiple cysts in the left kidney. Nonobstructing left renal stones. No hydronephrosis of either kidney. No significant perinephric edema. Distended urinary bladder that is displaced anteriorly by large stool burden in the distal colon. No bowel thickening. Stomach/Bowel: Gastrostomy tube appropriately position in the stomach. Stomach is otherwise unremarkable. Scattered air in fluid-filled small bowel in a nonobstructive pattern. No small bowel inflammation. Normal appendix. Mild colonic distension with air and stool. The sigmoid colon is redundant. The distal sigmoid colon is distended with stool within inspissated stool ball distending the rectum. There is rectal distention of 7.2 cm. Mild distal perirectal thickening. No trace perirectal edema. Scattered colonic diverticula without diverticulitis. Vascular/Lymphatic: Tortuous abdominal aorta with atherosclerosis. No aneurysm. Patent portal vein. No abdominopelvic adenopathy. Reproductive: Prostate is unremarkable.  Other: No free air, free fluid, or intra-abdominal fluid collection. Tiny fat containing umbilical hernia. Musculoskeletal: Stable degenerative in chronic change in the spine. Near complete disc space loss at L2-L3 with endplate irregularity is unchanged. IMPRESSION: 1. Large volume of stool in the distal sigmoid colon and rectum with inspissated stool ball in the rectal distension. Mild distal perirectal thickening. Findings suspicious for fecal impaction possible stercoral colitis. Slight improvement in wall thickening from prior. 2. No evidence of bowel obstruction. Scattered air in fluid throughout small bowel that is nondilated may represent subsequent ileus. 3. Distended urinary bladder that is displaced anteriorly by large stool burden in the distal colon. 4. Progressive dependent bilateral lower lobe airspace consolidations with air bronchograms, suspicious for pneumonia/aspiration. 5. Nonobstructing left renal stones. Marked right renal parenchymal atrophy and cortical scarring. Aortic Atherosclerosis (ICD10-I70.0). Electronically Signed   By: Keith Rake M.D.   On: 07/06/2019 21:49   DG Chest Port 1 View  Result Date: 07/06/2019 CLINICAL DATA:  Sepsis and abdominal pain EXAM: PORTABLE CHEST 1 VIEW COMPARISON:  05/15/2019 FINDINGS: Tracheostomy tube tip is at the level of the clavicular heads. There is bibasilar atelectasis. No pleural effusion or pneumothorax. Normal cardiomediastinal contours. IMPRESSION: Bibasilar atelectasis. Electronically Signed   By: Ulyses Jarred M.D.   On: 07/06/2019 19:20        Scheduled Meds: . amLODipine  10 mg Oral Daily  . chlorhexidine  5 mL Mouth/Throat BID  . clonazePAM  0.5 mg Oral Q8H  . cloNIDine  0.1 mg Oral Q8H  . docusate  100 mg Oral Q12H  . enoxaparin (LOVENOX) injection  40 mg Subcutaneous QHS  . famotidine  20 mg Oral QHS  . [START ON 07/08/2019] fentaNYL  1 patch Transdermal Q72H  . gabapentin  300 mg Oral BID  .  HYDROcodone-acetaminophen  1 tablet Oral BID  . insulin aspart  0-9 Units Subcutaneous Q4H  . insulin detemir  10 Units Subcutaneous QHS  . multivitamin with minerals  1 tablet Oral Daily  . olopatadine  1 drop Both Eyes BID  . senna  1 tablet Oral QHS  .  sertraline  100 mg Oral QHS  . sodium chloride flush  10-40 mL Intracatheter Q12H   Continuous Infusions: . meropenem (MERREM) IV Stopped (07/07/19 0357)  . vancomycin       LOS: 0 days    Time spent:40 min    Juancarlos Crescenzo, Geraldo Docker, MD [facility] Pager [phone]  If 7PM-7AM, please contact night-coverage [email or web address] Password [facility] 07/07/2019, 1:03 PM

## 2019-07-07 NOTE — Progress Notes (Signed)
PHARMACY - PHYSICIAN COMMUNICATION CRITICAL VALUE ALERT - BLOOD CULTURE IDENTIFICATION (BCID)  Luis Orr is an 69 y.o. male who presented to [facility] on 07/06/2019 with a chief complaint of abdominal pain   Assessment:  Patient from [facility] with UTI/PNA. Blood cultures show GPC/clusters. BCID shows staph species (methicillin resistance detected). Respiratory cultures pending   Name of physician (or Provider) Contacted: Dr. Sherral Hammers  Current antibiotics: meropenem and vancomycin   Changes to prescribed antibiotics recommended:  Patient is on recommended antibiotics - No changes needed  Results for orders placed or performed during the hospital encounter of 07/06/19  Blood Culture ID Panel (Reflexed) (Collected: 07/06/2019  6:53 PM)  Result Value Ref Range   Enterococcus species NOT DETECTED NOT DETECTED   Listeria monocytogenes NOT DETECTED NOT DETECTED   Staphylococcus species DETECTED (A) NOT DETECTED   Staphylococcus aureus (BCID) NOT DETECTED NOT DETECTED   Methicillin resistance DETECTED (A) NOT DETECTED   Streptococcus species NOT DETECTED NOT DETECTED   Streptococcus agalactiae NOT DETECTED NOT DETECTED   Streptococcus pneumoniae NOT DETECTED NOT DETECTED   Streptococcus pyogenes NOT DETECTED NOT DETECTED   Acinetobacter baumannii NOT DETECTED NOT DETECTED   Enterobacteriaceae species NOT DETECTED NOT DETECTED   Enterobacter cloacae complex NOT DETECTED NOT DETECTED   Escherichia coli NOT DETECTED NOT DETECTED   Klebsiella oxytoca NOT DETECTED NOT DETECTED   Klebsiella pneumoniae NOT DETECTED NOT DETECTED   Proteus species NOT DETECTED NOT DETECTED   Serratia marcescens NOT DETECTED NOT DETECTED   Haemophilus influenzae NOT DETECTED NOT DETECTED   Neisseria meningitidis NOT DETECTED NOT DETECTED   Pseudomonas aeruginosa NOT DETECTED NOT DETECTED   Candida albicans NOT DETECTED NOT DETECTED   Candida glabrata NOT DETECTED NOT DETECTED   Candida krusei NOT DETECTED  NOT DETECTED   Candida parapsilosis NOT DETECTED NOT DETECTED   Candida tropicalis NOT DETECTED NOT DETECTED    Hildred Laser, PharmD Clinical Pharmacist **Pharmacist phone directory can now be found on [email or web address] ([email or web address]).  Listed under [place].

## 2019-07-07 NOTE — Progress Notes (Signed)
Pt admitted to 4NP10 at 2030 via ED. A&Ox4, Vital signs  obtained and stable. Pt admitted to tele. Trach supplies and checklist posted at [facility]. Skin assessed, open sacral pressure injury noted. Sacral foam placed along with bilateral heel foams and prevalon boots.   Soft touch call bell within reach, bed in lowest position with alarm set. Pt oriented to room and resting in bed.

## 2019-07-07 NOTE — Progress Notes (Signed)
Pharmacy Antibiotic Note  Luis Orr is a 69 y.o. male admitted on 07/06/2019 with Intra-abdominal infection.  CCM recommends changing Zosyn to [place] (as pt was on Merrem recently at [facility]) and adding Vancomcyin.  Plan: Meropenem 1gm IV q8h Vancomycin 1500mg  IV now then 1000 mg IV Q 12 hrs.  Will f/u renal function, micro data, and pt's clinical condition Vanc levels prn   Height: 5\' 6"  (167.6 cm) Weight: 74.8 kg (165 lb) IBW/kg (Calculated) : 63.8  Temp (24hrs), Avg:99.3 F (37.4 C), Min:98.2 F (36.8 C), Max:100.6 F (38.1 C)  Recent Labs  Lab 07/06/19 1754 07/06/19 1849 07/06/19 1851  WBC 16.6*  --   --   CREATININE 0.62  --   --   LATICACIDVEN  --  1.4 1.4    Estimated Creatinine Clearance: 78.6 mL/min (by C-G formula based on SCr of 0.62 mg/dL).    No Known Allergies  Antimicrobials this admission: Zosyn 6/23 x1 Meropenem 6/24>> Vanc 6/24>>    Microbiology results: 6/23 BCx:  6/23 UCx:     Thank you for allowing pharmacy to be a part of this patient's care.  Sherlon Handing, PharmD, BCPS Please see amion for complete clinical pharmacist phone list 07/07/2019 12:46 AM

## 2019-07-07 NOTE — ED Notes (Signed)
Help clean patient up now patient is resting with call bell in reach

## 2019-07-07 NOTE — ED Notes (Signed)
Checked patient blood sugar it was 119 notified RN of blood sugar patient is resting with call bell in reach

## 2019-07-07 NOTE — Progress Notes (Signed)
Speech Pathology Note:  Orders received for swallow assessment.  Spoke with primary SLP at [place], who reports that patient underwent a FEES last Friday, 6/18, which revealed ongoing dysphagia.  She was beginning therapeutic feedings in the context of dysphagia treatment, with inline valve in place, but pt's primary nutrition was delivered via PEG.  Given circumstances, will defer SLP swallow evaluation.  Recommend pt resume SLP services, dysphagia treatment upon D/C back to [facility].  If circumstances/disposition changes and there are acute SLP needs, certainly re-consult [place].  Thank you,  Armour Villanueva L. Tivis Ringer, [place] Office number [phone] Pager [phone]

## 2019-07-07 NOTE — Consult Note (Signed)
WOC Nurse Consult Note: Patient receiving care in [facility] ED18. Reason for Consult: "pressure ulcer" Wound type: MASD-IAD, friction,shear to sacral/coccyx area Pressure Injury POA: Yes Measurement:4.5 cm x 4 cm x no measurable depth Wound bed: pink Drainage (amount, consistency, odor) none Periwound: hypopigmented, scarred Dressing procedure/placement/frequency:  Place a small piece of Aquacel dressing Kellie Simmering [phone]) over the sacral/coccyx wound and cover with a foam dressing.  Change daily. I have also added bilateral Prevalon heel lift boots, and a mattress with low air loss feature. Monitor the wound area(s) for worsening of condition such as: Signs/symptoms of infection,  Increase in size,  Development of or worsening of odor, Development of pain, or increased pain at the affected locations.  Notify the medical team if any of these develop.  Thank you for the consult.  Discussed plan of care with the patient.  [place] nurse will not follow at this time.  Please re-consult the [place] team if needed.  Val Riles, RN, MSN, CWOCN, CNS-BC, pager [phone]

## 2019-07-07 NOTE — ED Notes (Signed)
prevalon boots applied

## 2019-07-07 NOTE — H&P (Addendum)
NAME:  Luis Orr, MRN:  [id number], DOB:  [birth year], LOS: 0 ADMISSION DATE:  07/06/2019, CONSULTATION DATE:  6/24 REFERRING MD:  Dr. Alejandro Mulling, CHIEF COMPLAINT: Abdominal pain  Brief History   69 year old male trach/vent dependent from [facility] with abdominal pain. Found to have fecal impaction, UTI, pneumonia. Admitted to hospitalists service. PCCM consult for vent.   History of present illness   Patient is encephalopathic and/or intubated. Therefore history has been obtained from chart review.   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 He has been vent dependent since 01/2017 and resides at [facility]. Recent course has been complicated by abdominal pain and distention. He has been diagnosed with ileus and has been an ongoing issue for what sounds like more than one week. On 6/23 abdominal distension became worse.  Xray of the abdomen was performed and indicated worsening ileus. He was noted to have altered mental status at the SNF and was transferred to [facility] ED 6/23 in this context. Workup in the ED was significant for CT abdomen demonstrating lower lobe consolidation, fecal impaction, and stercoral colitis. Laboratory evaluation significant for WBC 16.6, CO2 18, and Glucose 242. UA concerning for infection. He was treated with empiric antibiotics and was admitted to the hospitalists service for sepsis, UTI, Pneumonia,  And fecal impaction. PCCM was asked to see for ventilator management.   Past Medical History   has a past medical history of Anemia, Anxiety, Chronic respiratory failure ([place]), DM2 (diabetes mellitus, type 2) ([place]), Functional quadriplegia ([place]), Hypertension, and Urine retention.  Significant Hospital Events   6/23 admit  Consults:  PCCM  Procedures:  Trach pre hospital #6  Significant Diagnostic Tests:  CT abdomen 6/24 > Fecal impaction possible stercoral colitis. No  evidence of bowel obstruction. Scattered air in fluid throughout small bowel that is nondilated may represent subsequent ileus. Distended urinary bladder. Progressive dependent bilateral lower lobe airspace consolidations with air bronchograms, suspicious for pneumonia/aspiration  Micro Data:  Blood 6/23 > Urine 6/23 >  Blood 6/23 >  Antimicrobials:   Zosyn 6/23 Vancomycin 6/24 > Meropenem 6/24 >  Interim history/subjective:  Remains in intensive care unit awaiting ICU bed.  Objective   Blood pressure 103/74, pulse (!) 55, temperature 99.2 F (37.3 C), temperature source Rectal, resp. rate 16, height 5\' 6"  (1.676 m), weight 74.8 kg, SpO2 100 %.    Vent Mode: PRVC FiO2 (%):  [30 %] 30 % Set Rate:  [16 bmp] 16 bmp Vt Set:  [500 mL] 500 mL PEEP:  [5 cmH20] 5 cmH20 Plateau Pressure:  [15 cmH20-20 cmH20] 15 cmH20   Intake/Output Summary (Last 24 hours) at 07/07/2019 1015 Last data filed at 07/07/2019 0002 Gross per 24 hour  Intake --  Output 850 ml  Net -850 ml   Filed Weights   07/06/19 1657 07/06/19 1659  Weight: 65.8 kg 74.8 kg    Examination:  General: 69 year old male who has quadriparesis on full mechanical ventilatory support HEENT: Tracheostomy is in place Neuro: Able to nod and laugh appropriately CV: Heart sounds are regular regular rate and rhythm PULM: Diminished breath sounds throughout Pressure regulated volume control  GI: soft, bsx4 active PEG is in place: Extremities: warm/dry, 1+ edema  Skin: no rashes or lesions  General: Patient in no acute distress HENT: Trach in place appears clean Lungs: Coarse mechanical breath sounds Cardiovascular: RRR Abdomen: Diffusely tender soft with minimal duistension Extremities: Unable  to move lower extremities.  Neuro: Alert and interactive makes good eye contact. Able to follow simple commands GU: Diaper in place. Minimal sacral breakdown noted maybe stage 2  Resolved Hospital Problem list   NA  Assessment &  Plan:  This is a 69 yo with history as noted above who presents for concerns for sepsis. PCCM consulted for vent management.    Chronic respiratory failure-2/2 to compressive myelopathy has been vent dependant since 2018. Is on assist control Fio2 28% TV 500, peep 5 and Rate of 16 at facility.  Continue current settings of respirator Serial chest x-ray Sputum cultures Since she is in a long-term care facility care will cover potential MRSA and Pseudomonas   -   Sepsis-  Broad-spectrum antibiotics Aggressive pulmonary toilet Follow culture data              Best practice:    Labs   CBC: Recent Labs  Lab 07/06/19 1754 07/07/19 0104 07/07/19 0400  WBC 16.6*  --  14.6*  HGB 13.1 11.6* 10.5*  HCT 41.4 34.0* 31.0*  MCV 75.7*  --  72.9*  PLT 264  --  540    Basic Metabolic Panel: Recent Labs  Lab 07/06/19 1754 07/07/19 0104 07/07/19 0400  NA 141 146* 143  K 4.0 3.2* 3.1*  CL 105  --  108  CO2 18*  --  23  GLUCOSE 242*  --  174*  BUN 29*  --  23  CREATININE 0.62  --  0.47*  CALCIUM 9.5  --  9.0   GFR: Estimated Creatinine Clearance: 78.6 mL/min (A) (by C-G formula based on SCr of 0.47 mg/dL (L)). Recent Labs  Lab 07/06/19 1754 07/06/19 1849 07/06/19 1851 07/07/19 0400  WBC 16.6*  --   --  14.6*  LATICACIDVEN  --  1.4 1.4 0.9    Liver Function Tests: Recent Labs  Lab 07/06/19 1754  AST 44*  ALT 36  ALKPHOS 86  BILITOT 1.4*  PROT 8.5*  ALBUMIN 3.5   Recent Labs  Lab 07/06/19 1754  LIPASE 26   No results for input(s): AMMONIA in the last 168 hours.  ABG    Component Value Date/Time   PHART 7.432 07/07/2019 0104   PCO2ART 39.4 07/07/2019 0104   PO2ART 94 07/07/2019 0104   HCO3 26.2 07/07/2019 0104   TCO2 27 07/07/2019 0104   O2SAT 97.0 07/07/2019 0104     Coagulation Profile: Recent Labs  Lab 07/06/19 1922  INR 1.3*    Cardiac Enzymes: No results for input(s): CKTOTAL, CKMB, CKMBINDEX, TROPONINI in the last 168  hours.  HbA1C: Hgb A1c MFr Bld  Date/Time Value Ref Range Status  07/07/2019 04:00 AM 7.0 (H) 4.8 - 5.6 % Final    Comment:    (NOTE) Pre diabetes:          5.7%-6.4%  Diabetes:              >6.4%  Glycemic control for   <7.0% adults with diabetes     CBG: Recent Labs  Lab 07/07/19 0223 07/07/19 0756  GLUCAP 161* 94      Critical care time: 35 minutes     Richardson Landry Minor ACNP Acute Care Nurse Practitioner Maryanna Shape Pulmonary/Critical Care Please consult Amion 07/07/2019, 10:15 AM   PCCM:  69 yo quad, sepsis from UTI, improving, on mero + vanco, s/p IVFs, chronic vent, trach peg, cervical quad, vent dependent  PCCM consult for vent management   BP 133/75  Pulse 85   Temp 99.2 F (37.3 C) (Rectal)   Resp 16   Ht 5\' 6"  (1.676 m)   Wt 74.8 kg   SpO2 98%   BMI 26.63 kg/m   GEN: chronically ill HENT: trach in place, on vent  Neuro: alert to voce, attempts to communicate  Heart: RRR, s1 s2  Lungs: BL vented breaths   Labs reviewed  CXR: stable, no infiltrate The patient's images have been independently reviewed by me.    VENT: Vent Mode: PRVC FiO2 (%):  [30 %] 30 % Set Rate:  [16 bmp] 16 bmp Vt Set:  [500 mL] 500 mL PEEP:  [5 cmH20] 5 cmH20 Plateau Pressure:  [15 cmH20-20 cmH20] 15 cmH20  A: Chronic vent  Sepsis UTI - resolving  Cervical quad  VDRF   P: Stable on current setting  CXR and ABG PRN  Routine trach care  Pulmonary will follow as needed for vent and trach questions  ABX per primary, de-escalate once cultures are back   Garner Nash, DO [place] [facility] 07/07/2019 11:25 AM

## 2019-07-08 ENCOUNTER — Inpatient Hospital Stay (HOSPITAL_COMMUNITY): Payer: Medicare Other

## 2019-07-08 LAB — GLUCOSE, CAPILLARY
Glucose-Capillary: 108 mg/dL — ABNORMAL HIGH (ref 70–99)
Glucose-Capillary: 110 mg/dL — ABNORMAL HIGH (ref 70–99)
Glucose-Capillary: 117 mg/dL — ABNORMAL HIGH (ref 70–99)
Glucose-Capillary: 125 mg/dL — ABNORMAL HIGH (ref 70–99)
Glucose-Capillary: 164 mg/dL — ABNORMAL HIGH (ref 70–99)
Glucose-Capillary: 204 mg/dL — ABNORMAL HIGH (ref 70–99)
Glucose-Capillary: 65 mg/dL — ABNORMAL LOW (ref 70–99)
Glucose-Capillary: 69 mg/dL — ABNORMAL LOW (ref 70–99)

## 2019-07-08 LAB — COMPREHENSIVE METABOLIC PANEL
ALT: 24 U/L (ref 0–44)
AST: 19 U/L (ref 15–41)
Albumin: 2.9 g/dL — ABNORMAL LOW (ref 3.5–5.0)
Alkaline Phosphatase: 64 U/L (ref 38–126)
Anion gap: 12 (ref 5–15)
BUN: 18 mg/dL (ref 8–23)
CO2: 22 mmol/L (ref 22–32)
Calcium: 9.2 mg/dL (ref 8.9–10.3)
Chloride: 109 mmol/L (ref 98–111)
Creatinine, Ser: 0.47 mg/dL — ABNORMAL LOW (ref 0.61–1.24)
GFR calc Af Amer: >60 mL/min (ref >60)
GFR calc non Af Amer: >60 mL/min (ref >60)
Glucose, Bld: 96 mg/dL (ref 70–99)
Potassium: 3.7 mmol/L (ref 3.5–5.1)
Sodium: 143 mmol/L (ref 135–145)
Total Bilirubin: 1.4 mg/dL — ABNORMAL HIGH (ref 0.3–1.2)
Total Protein: 7.5 g/dL (ref 6.5–8.1)

## 2019-07-08 LAB — CBC WITH DIFFERENTIAL/PLATELET
Abs Immature Granulocytes: 0.04 10*3/uL (ref 0.00–0.07)
Basophils Absolute: 0.1 10*3/uL (ref 0.0–0.1)
Basophils Relative: 1 %
Eosinophils Absolute: 0.1 10*3/uL (ref 0.0–0.5)
Eosinophils Relative: 1 %
HCT: 34.3 % — ABNORMAL LOW (ref 39.0–52.0)
Hemoglobin: 11.4 g/dL — ABNORMAL LOW (ref 13.0–17.0)
Immature Granulocytes: 0 %
Lymphocytes Relative: 22 %
Lymphs Abs: 1.9 10*3/uL (ref 0.7–4.0)
MCH: 24 pg — ABNORMAL LOW (ref 26.0–34.0)
MCHC: 33.2 g/dL (ref 30.0–36.0)
MCV: 72.2 fL — ABNORMAL LOW (ref 80.0–100.0)
Monocytes Absolute: 0.8 10*3/uL (ref 0.1–1.0)
Monocytes Relative: 9 %
Neutro Abs: 6 10*3/uL (ref 1.7–7.7)
Neutrophils Relative %: 67 %
Platelets: 325 10*3/uL (ref 150–400)
RBC: 4.75 MIL/uL (ref 4.22–5.81)
RDW: 15.7 % — ABNORMAL HIGH (ref 11.5–15.5)
WBC: 8.9 10*3/uL (ref 4.0–10.5)
nRBC: 0 % (ref 0.0–0.2)

## 2019-07-08 LAB — CULTURE, BLOOD (ROUTINE X 2): Special Requests: ADEQUATE

## 2019-07-08 LAB — PHOSPHORUS: Phosphorus: 2.4 mg/dL — ABNORMAL LOW (ref 2.5–4.6)

## 2019-07-08 LAB — MAGNESIUM: Magnesium: 2 mg/dL (ref 1.7–2.4)

## 2019-07-08 LAB — MRSA PCR SCREENING: MRSA by PCR: NEGATIVE

## 2019-07-08 MED ORDER — ENSURE ENLIVE PO LIQD
237.0000 mL | Freq: Three times a day (TID) | ORAL | Status: DC
  Administered 2019-07-08: 237 mL

## 2019-07-08 MED ORDER — SENNA 8.6 MG PO TABS
1.0000 | ORAL_TABLET | Freq: Every day | ORAL | Status: DC
  Administered 2019-07-08 – 2019-07-11 (×4): 8.6 mg
  Filled 2019-07-08 (×3): qty 1

## 2019-07-08 MED ORDER — DEXTROSE-NACL 5-0.9 % IV SOLN: INTRAVENOUS | Status: DC

## 2019-07-08 MED ORDER — PRO-STAT SUGAR FREE PO LIQD
30.0000 mL | Freq: Three times a day (TID) | ORAL | Status: DC
  Administered 2019-07-08 – 2019-07-12 (×12): 30 mL
  Filled 2019-07-08 (×12): qty 30

## 2019-07-08 MED ORDER — CLONAZEPAM 0.5 MG PO TABS
0.5000 mg | ORAL_TABLET | Freq: Three times a day (TID) | ORAL | Status: DC
  Administered 2019-07-08 – 2019-07-12 (×12): 0.5 mg
  Filled 2019-07-08 (×11): qty 1

## 2019-07-08 MED ORDER — CLONIDINE HCL 0.1 MG PO TABS
0.1000 mg | ORAL_TABLET | Freq: Three times a day (TID) | ORAL | Status: DC
  Administered 2019-07-08 – 2019-07-12 (×12): 0.1 mg
  Filled 2019-07-08 (×11): qty 1

## 2019-07-08 MED ORDER — DOCUSATE SODIUM 50 MG/5ML PO LIQD
100.0000 mg | Freq: Two times a day (BID) | ORAL | Status: DC
  Administered 2019-07-08 – 2019-07-11 (×7): 100 mg
  Filled 2019-07-08 (×6): qty 10

## 2019-07-08 MED ORDER — SIMETHICONE 80 MG PO CHEW: 80.0000 mg | CHEWABLE_TABLET | Freq: Three times a day (TID) | ORAL | Status: DC | PRN

## 2019-07-08 MED ORDER — DEXTROSE 50 % IV SOLN
1.0000 | Freq: Once | INTRAVENOUS | Status: AC
  Administered 2019-07-08: 50 mL via INTRAVENOUS
  Filled 2019-07-08: qty 50

## 2019-07-08 MED ORDER — GABAPENTIN 250 MG/5ML PO SOLN
300.0000 mg | Freq: Two times a day (BID) | ORAL | Status: DC
  Administered 2019-07-08 – 2019-07-12 (×8): 300 mg
  Filled 2019-07-08 (×12): qty 6

## 2019-07-08 MED ORDER — SERTRALINE HCL 100 MG PO TABS
100.0000 mg | ORAL_TABLET | Freq: Every day | ORAL | Status: DC
  Administered 2019-07-08 – 2019-07-11 (×4): 100 mg
  Filled 2019-07-08 (×3): qty 1

## 2019-07-08 MED ORDER — OSMOLITE 1.5 CAL PO LIQD
1000.0000 mL | ORAL | Status: DC
  Administered 2019-07-08 – 2019-07-09 (×2): 1000 mL
  Filled 2019-07-08 (×7): qty 1000

## 2019-07-08 MED ORDER — HYDROCODONE-ACETAMINOPHEN 7.5-325 MG PO TABS
1.0000 | ORAL_TABLET | Freq: Two times a day (BID) | ORAL | Status: DC
  Administered 2019-07-08 – 2019-07-12 (×8): 1
  Filled 2019-07-08 (×7): qty 1

## 2019-07-08 MED ORDER — FAMOTIDINE 20 MG PO TABS
20.0000 mg | ORAL_TABLET | Freq: Every day | ORAL | Status: DC
  Administered 2019-07-08 – 2019-07-11 (×4): 20 mg
  Filled 2019-07-08 (×3): qty 1

## 2019-07-08 MED ORDER — OSMOLITE 1.2 CAL PO LIQD
1000.0000 mL | ORAL | Status: DC
  Filled 2019-07-08: qty 1000

## 2019-07-08 NOTE — Progress Notes (Signed)
PROGRESS NOTE    Luis Orr  YIR:[id number] DOB: Mar 31, 1950 DOA: 07/06/2019 PCP: Townsend Roger, MD     Brief Narrative:  69 y.o. BM PMHx   anxiety, diabetes type 2 controlled with complication, HTN, urine retention, chronic respiratory failure on vent.  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  Pt is Vent dependent as a result since end of 2018.  Pt has been residing in [facility] on chronic vent since Jan 2019.  Despite the quadriplegia and respiratory failure, pt is apparently able to urinate without the use of a catheter.  Pt sent in to the ED today with c/o abd pain.  Concern for worsening abd distention and "ileus".  Patient seems AAOx3, but history taking is difficult secondary to pt being on chronic mechanical ventilator and unable to make audible words (have to lip read patient).  Thus most of the history is confirmed from his wife.   Subjective: 6/25 afebrile overnight alert, nods yes and no to questions.  Follows commands.  Laughs at jokes.  Chronic trach in place.  Chronic pain tube in place.  Negative CP, negative abdominal pain   Assessment & Plan: Covid vaccination;??   Principal Problem:   Sepsis secondary to UTI [facility]) Active Problems:   Functional quadriplegia (HCC)   Chronic respiratory failure requiring continuous mechanical ventilation through tracheostomy ([place])   Fecal impaction (HCC)   HTN (hypertension)   DM2 (diabetes mellitus, type 2) (HCC)  positive coag negative staph 1/2 most likely contaminant -Positive methicillin-resistant Staphylococcus awaiting speciation -Continue current antibiotic regimen  Sepsis UTI -Continue meropenem plus+ vancomycin per PCCM recommendation. -Cultures pending -D5-0.9% saline 30ml/hr  Chronic ventilator -Per PCCM -We will continue current antibiotic regimen.  Trach aspirate being reintubated  Fecal impaction  -Per patient resolved -Continue n.p.o. if patient does well overnight will  start with full liquid diet per PEG tube in a.m.  Essential HTN -Amlodipine 10 mg daily -Clonidine 0.1 mg TID  Diabetes type 2 hold with complication -5/00 hemoglobin A1c 7.0 -Levemir 10 units daily -Sensitive SSI  Hypokalemia -resolved  Stage III sacral decubitus ulcer Pressure Injury 07/07/19 Sacrum Mid Stage 3 -  Full thickness tissue loss. Subcutaneous fat may be visible but bone, tendon or muscle are NOT exposed. (Active)  07/07/19 2030  Location: Sacrum  Location Orientation: Mid  Staging: Stage 3 -  Full thickness tissue loss. Subcutaneous fat may be visible but bone, tendon or muscle are NOT exposed.  Wound Description (Comments):   Present on Admission: Yes        DVT prophylaxis: Lovenox Code Status: Full Family Communication:  Status is: Inpatient    Dispo: The patient is from: [facility]              Anticipated d/c is to: [facility]              Anticipated d/c date is: 6/30              Patient currently unstable staph bacteremia      Consultants:  PCCM   Procedures/Significant Events:     I have personally reviewed and interpreted all radiology studies and my findings are as above.  VENTILATOR SETTINGS: Mode; PRVC 6/25 Vt; 560ml Set rate; 16 FiO2; 30% PEEP; 5 SPO2; 100%   Cultures 6/23 blood positive coag negative staph 1/2 most likely contaminant 6/23 blood NGTD 6/23 urine positive Klebsiella pneumoniae 6/24 tracheal aspirate NGTD    Antimicrobials: Anti-infectives (From admission, onward)   Start  Ordered Stop   07/07/19 1300  vancomycin (VANCOCIN) IVPB 1000 mg/200 mL premix     Discontinue     07/07/19 0048     07/07/19 0200  meropenem (MERREM) 1 g in sodium chloride 0.9 % 100 mL IVPB     Discontinue     07/07/19 0048     07/07/19 0100  piperacillin-tazobactam (ZOSYN) IVPB 3.375 g  Status:  Discontinued        07/06/19 2304 07/07/19 0041   07/07/19 0100  vancomycin (VANCOREADY) IVPB 1500 mg/300 mL        07/07/19 0048  07/07/19 0343   07/06/19 1900  piperacillin-tazobactam (ZOSYN) IVPB 3.375 g        07/06/19 1849 07/06/19 2000       Devices    LINES / TUBES:      Continuous Infusions:  sodium chloride 75 mL/hr at 07/08/19 0400   meropenem (MERREM) IV 1 g (07/08/19 8182)   vancomycin 1,000 mg (07/08/19 0105)     Objective: Vitals:   07/08/19 0202 07/08/19 0352 07/08/19 0821 07/08/19 0853  BP:  137/74 131/71   Pulse: (!) 52 (!) 56 (!) 56   Resp:  16 16   Temp:  98.3 F (36.8 C) 98.5 F (36.9 C)   TempSrc:  Oral Oral   SpO2:  100% 100% 100%  Weight:      Height:        Intake/Output Summary (Last 24 hours) at 07/08/2019 9937 Last data filed at 07/08/2019 1696 Gross per 24 hour  Intake 1250 ml  Output 1252 ml  Net -2 ml   Filed Weights   07/06/19 1657 07/06/19 1659  Weight: 65.8 kg 74.8 kg    Examination:  General: Sleepy but arousable, positive chronic respiratory distress (vent dependent) Eyes: negative scleral hemorrhage, negative anisocoria, negative icterus ENT: Negative Runny nose, negative gingival bleeding, Neck:  Negative scars, masses, torticollis, lymphadenopathy, JVD #6 cuffed Shiley in place covered and clean negative sign of infection Lungs: Clear to auscultation bilaterally without wheezes or crackles Cardiovascular: Regular rate and rhythm without murmur gallop or rub normal S1 and S2 Abdomen: negative abdominal pain, nondistended, positive soft, bowel sounds, no rebound, no ascites, no appreciable mass, PEG tube in place covered and clean no sign of infection. Extremities: All extremities 1-2+ edema Skin: Negative rashes, lesions, ulcers Psychiatric: Unable to assess secondary to chronic vent Central nervous system: Patient nods yes and no to questions, also will speak to you but you must read his lips.  Spontaneously moves bilateral upper extremities  .     Data Reviewed: Care during the described time interval was provided by me .  I have reviewed  this patient's available data, including medical history, events of note, physical examination, and all test results as part of my evaluation.  CBC: Recent Labs  Lab 07/06/19 1754 07/07/19 0104 07/07/19 0400 07/08/19 0510  WBC 16.6*  --  14.6* 8.9  NEUTROABS  --   --   --  6.0  HGB 13.1 11.6* 10.5* 11.4*  HCT 41.4 34.0* 31.0* 34.3*  MCV 75.7*  --  72.9* 72.2*  PLT 264  --  316 789   Basic Metabolic Panel: Recent Labs  Lab 07/06/19 1754 07/07/19 0104 07/07/19 0400 07/08/19 0510  NA 141 146* 143 143  K 4.0 3.2* 3.1* 3.7  CL 105  --  108 109  CO2 18*  --  23 22  GLUCOSE 242*  --  174* 96  BUN 29*  --  23 18  CREATININE 0.62  --  0.47* 0.47*  CALCIUM 9.5  --  9.0 9.2  MG  --   --   --  2.0  PHOS  --   --   --  2.4*   GFR: Estimated Creatinine Clearance: 78.6 mL/min (A) (by C-G formula based on SCr of 0.47 mg/dL (L)). Liver Function Tests: Recent Labs  Lab 07/06/19 1754 07/08/19 0510  AST 44* 19  ALT 36 24  ALKPHOS 86 64  BILITOT 1.4* 1.4*  PROT 8.5* 7.5  ALBUMIN 3.5 2.9*   Recent Labs  Lab 07/06/19 1754  LIPASE 26   No results for input(s): AMMONIA in the last 168 hours. Coagulation Profile: Recent Labs  Lab 07/06/19 1922  INR 1.3*   Cardiac Enzymes: No results for input(s): CKTOTAL, CKMB, CKMBINDEX, TROPONINI in the last 168 hours. BNP (last 3 results) No results for input(s): PROBNP in the last 8760 hours. HbA1C: Recent Labs    07/07/19 0400  HGBA1C 7.0*   CBG: Recent Labs  Lab 07/07/19 2038 07/08/19 0002 07/08/19 0351 07/08/19 0820 07/08/19 0846  GLUCAP 107* 117* 108* 69* 65*   Lipid Profile: No results for input(s): CHOL, HDL, LDLCALC, TRIG, CHOLHDL, LDLDIRECT in the last 72 hours. Thyroid Function Tests: No results for input(s): TSH, T4TOTAL, FREET4, T3FREE, THYROIDAB in the last 72 hours. Anemia Panel: No results for input(s): VITAMINB12, FOLATE, FERRITIN, TIBC, IRON, RETICCTPCT in the last 72 hours. Sepsis Labs: Recent Labs    Lab 07/06/19 1849 07/06/19 1851 07/07/19 0400  LATICACIDVEN 1.4 1.4 0.9    Recent Results (from the past 240 hour(s))  Urine culture     Status: Abnormal (Preliminary result)   Collection Time: 07/06/19  6:47 PM   Specimen: In/Out Cath Urine  Result Value Ref Range Status   Specimen Description IN/OUT CATH URINE  Final   Special Requests   Final    NONE Performed at [facility], [place] [address], [place], [place] [address]    Culture >=100,000 COLONIES/mL GRAM NEGATIVE RODS (A)  Final   Report Status PENDING  Incomplete  Blood Culture (routine x 2)     Status: None (Preliminary result)   Collection Time: 07/06/19  6:53 PM   Specimen: Site Not Specified; Blood  Result Value Ref Range Status   Specimen Description SITE NOT SPECIFIED  Final   Special Requests   Final    BOTTLES DRAWN AEROBIC AND ANAEROBIC Blood Culture adequate volume   Culture  Setup Time   Final    GRAM POSITIVE COCCI IN CLUSTERS AEROBIC BOTTLE ONLY Organism ID to follow CRITICAL RESULT CALLED TO, READ BACK BY AND VERIFIED WITH: Reece Packer 16:35 07/07/19 ([email or web address]) Performed at [facility], [place] [address], [place], [place] [address]    Culture GRAM POSITIVE COCCI  Final   Report Status PENDING  Incomplete  Blood Culture ID Panel (Reflexed)     Status: Abnormal   Collection Time: 07/06/19  6:53 PM  Result Value Ref Range Status   Enterococcus species NOT DETECTED NOT DETECTED Final   Listeria monocytogenes NOT DETECTED NOT DETECTED Final   Staphylococcus species DETECTED (A) NOT DETECTED Final    Comment: Methicillin (oxacillin) resistant coagulase negative staphylococcus. Possible blood culture contaminant (unless isolated from more than one blood culture draw or clinical case suggests pathogenicity). No antibiotic treatment is indicated for blood  culture contaminants. CRITICAL RESULT CALLED TO, READ BACK BY AND VERIFIED WITH: Reece Packer PharmD 16:35 07/07/19 ([email or web address])  Staphylococcus aureus  (BCID) NOT DETECTED NOT DETECTED Final   Methicillin resistance DETECTED (A) NOT DETECTED Final    Comment: CRITICAL RESULT CALLED TO, READ BACK BY AND VERIFIED WITH: Reece Packer PharmD 16:35 07/07/19 ([email or web address])    Streptococcus species NOT DETECTED NOT DETECTED Final   Streptococcus agalactiae NOT DETECTED NOT DETECTED Final   Streptococcus pneumoniae NOT DETECTED NOT DETECTED Final   Streptococcus pyogenes NOT DETECTED NOT DETECTED Final   Acinetobacter baumannii NOT DETECTED NOT DETECTED Final   Enterobacteriaceae species NOT DETECTED NOT DETECTED Final   Enterobacter cloacae complex NOT DETECTED NOT DETECTED Final   Escherichia coli NOT DETECTED NOT DETECTED Final   Klebsiella oxytoca NOT DETECTED NOT DETECTED Final   Klebsiella pneumoniae NOT DETECTED NOT DETECTED Final   Proteus species NOT DETECTED NOT DETECTED Final   Serratia marcescens NOT DETECTED NOT DETECTED Final   Haemophilus influenzae NOT DETECTED NOT DETECTED Final   Neisseria meningitidis NOT DETECTED NOT DETECTED Final   Pseudomonas aeruginosa NOT DETECTED NOT DETECTED Final   Candida albicans NOT DETECTED NOT DETECTED Final   Candida glabrata NOT DETECTED NOT DETECTED Final   Candida krusei NOT DETECTED NOT DETECTED Final   Candida parapsilosis NOT DETECTED NOT DETECTED Final   Candida tropicalis NOT DETECTED NOT DETECTED Final    Comment: Performed at [facility], [place], [place], [place] [address]  Blood Culture (routine x 2)     Status: None (Preliminary result)   Collection Time: 07/06/19  6:57 PM   Specimen: Site Not Specified; Blood  Result Value Ref Range Status   Specimen Description SITE NOT SPECIFIED  Final   Special Requests   Final    BOTTLES DRAWN AEROBIC AND ANAEROBIC Blood Culture adequate volume   Culture   Final    NO GROWTH < 12 HOURS Performed at [facility], [place] [address], [place], [place] [address]    Report Status PENDING  Incomplete  SARS Coronavirus 2 by RT PCR  (hospital order, performed in [facility] hospital lab) Nasopharyngeal Nasopharyngeal Swab     Status: None   Collection Time: 07/06/19 10:30 PM   Specimen: Nasopharyngeal Swab  Result Value Ref Range Status   SARS Coronavirus 2 NEGATIVE NEGATIVE Final    Comment: (NOTE) SARS-CoV-2 target nucleic acids are NOT DETECTED.  The SARS-CoV-2 RNA is generally detectable in upper and lower respiratory specimens during the acute phase of infection. The lowest concentration of SARS-CoV-2 viral copies this assay can detect is 250 copies / mL. A negative result does not preclude SARS-CoV-2 infection and should not be used as the sole basis for treatment or other patient management decisions.  A negative result may occur with improper specimen collection / handling, submission of specimen other than nasopharyngeal swab, presence of viral mutation(s) within the areas targeted by this assay, and inadequate number of viral copies (<250 copies / mL). A negative result must be combined with clinical observations, patient history, and epidemiological information.  Fact Sheet for Patients:   StrictlyIdeas.no  Fact Sheet for Healthcare Providers: BankingDealers.co.za  This test is not yet approved or  cleared by the [place] FDA and has been authorized for detection and/or diagnosis of SARS-CoV-2 by FDA under an Emergency Use Authorization (EUA).  This EUA will remain in effect (meaning this test can be used) for the duration of the COVID-19 declaration under Section 564(b)(1) of the Act, 21 U.S.C. section 360bbb-3(b)(1), unless the authorization is terminated or revoked sooner.  Performed at  [facility], [place] [address] [place], [place] [address]   Culture, respiratory (non-expectorated)     Status: None (Preliminary result)   Collection Time: 07/07/19  4:25 AM   Specimen: Tracheal Aspirate; Respiratory  Result Value Ref Range Status    Specimen Description TRACHEAL ASPIRATE  Final   Special Requests NONE  Final   Gram Stain   Final    FEW WBC PRESENT, PREDOMINANTLY PMN NO ORGANISMS SEEN Performed at [facility], [address] [place], [place] [address]    Culture PENDING  Incomplete   Report Status PENDING  Incomplete  MRSA PCR Screening     Status: None   Collection Time: 07/07/19  8:49 PM   Specimen: Nasopharyngeal  Result Value Ref Range Status   MRSA by PCR NEGATIVE NEGATIVE Final    Comment:        The GeneXpert MRSA Assay (FDA approved for NASAL specimens only), is one component of a comprehensive MRSA colonization surveillance program. It is not intended to diagnose MRSA infection nor to guide or monitor treatment for MRSA infections. Performed at [facility], [place] [address], [place], [place] [address]          Radiology Studies: CT ABDOMEN PELVIS W CONTRAST  Result Date: 07/06/2019 CLINICAL DATA:  Worsening distension vomiting. EXAM: CT ABDOMEN AND PELVIS WITH CONTRAST TECHNIQUE: Multidetector CT imaging of the abdomen and pelvis was performed using the standard protocol following bolus administration of intravenous contrast. CONTRAST:  151mL OMNIPAQUE IOHEXOL 300 MG/ML  SOLN COMPARISON:  Abdominal CT 05/15/2019 FINDINGS: Lower chest: Again seen dependent bilateral lower lobe airspace consolidations with air bronchograms, progressed from prior exam. Heart is normal in size. Hepatobiliary: No focal liver abnormality is seen. No gallstones, gallbladder wall thickening, or biliary dilatation. Pancreas: No ductal dilatation or inflammation. Spleen: Normal in size without focal abnormality. Adrenals/Urinary Tract: No adrenal nodule. Marked right renal parenchymal atrophy and cortical scarring. Inferior is of perfused cortex in the upper medial and inferior pole. Small cyst in the right kidney. Multiple cysts in the left kidney. Nonobstructing left renal stones. No hydronephrosis of either  kidney. No significant perinephric edema. Distended urinary bladder that is displaced anteriorly by large stool burden in the distal colon. No bowel thickening. Stomach/Bowel: Gastrostomy tube appropriately position in the stomach. Stomach is otherwise unremarkable. Scattered air in fluid-filled small bowel in a nonobstructive pattern. No small bowel inflammation. Normal appendix. Mild colonic distension with air and stool. The sigmoid colon is redundant. The distal sigmoid colon is distended with stool within inspissated stool ball distending the rectum. There is rectal distention of 7.2 cm. Mild distal perirectal thickening. No trace perirectal edema. Scattered colonic diverticula without diverticulitis. Vascular/Lymphatic: Tortuous abdominal aorta with atherosclerosis. No aneurysm. Patent portal vein. No abdominopelvic adenopathy. Reproductive: Prostate is unremarkable. Other: No free air, free fluid, or intra-abdominal fluid collection. Tiny fat containing umbilical hernia. Musculoskeletal: Stable degenerative in chronic change in the spine. Near complete disc space loss at L2-L3 with endplate irregularity is unchanged. IMPRESSION: 1. Large volume of stool in the distal sigmoid colon and rectum with inspissated stool ball in the rectal distension. Mild distal perirectal thickening. Findings suspicious for fecal impaction possible stercoral colitis. Slight improvement in wall thickening from prior. 2. No evidence of bowel obstruction. Scattered air in fluid throughout small bowel that is nondilated may represent subsequent ileus. 3. Distended urinary bladder that is displaced anteriorly by large stool burden in the distal colon. 4. Progressive dependent bilateral lower  lobe airspace consolidations with air bronchograms, suspicious for pneumonia/aspiration. 5. Nonobstructing left renal stones. Marked right renal parenchymal atrophy and cortical scarring. Aortic Atherosclerosis (ICD10-I70.0). Electronically Signed    By: Keith Rake M.D.   On: 07/06/2019 21:49   DG Chest Port 1 View  Result Date: 07/08/2019 CLINICAL DATA:  Abnormal respiration, tracheostomy EXAM: PORTABLE CHEST 1 VIEW COMPARISON:  Radiograph 07/06/2019 FINDINGS: Tracheostomy tube terminates in the mid trachea, approximately 5 cm from the carina. Telemetry leads and ventilator tubing overlie the chest. There are layering bilateral pleural effusions and basilar areas of atelectasis. More patchy opacity in the right infrahilar lung could reflect further atelectatic change or developing airspace disease. No pneumothorax. Stable cardiomediastinal contours. No acute osseous or soft tissue abnormality. Degenerative changes are present in the imaged spine and shoulders. IMPRESSION: 1. Layering bilateral pleural effusions with basilar atelectasis. 2. More patchy opacity in the right infrahilar lung could reflect further atelectatic change or developing airspace disease. Electronically Signed   By: Lovena Le M.D.   On: 07/08/2019 06:31   DG Chest Port 1 View  Result Date: 07/06/2019 CLINICAL DATA:  Sepsis and abdominal pain EXAM: PORTABLE CHEST 1 VIEW COMPARISON:  05/15/2019 FINDINGS: Tracheostomy tube tip is at the level of the clavicular heads. There is bibasilar atelectasis. No pleural effusion or pneumothorax. Normal cardiomediastinal contours. IMPRESSION: Bibasilar atelectasis. Electronically Signed   By: Ulyses Jarred M.D.   On: 07/06/2019 19:20        Scheduled Meds:  amLODipine  10 mg Oral Daily   chlorhexidine  5 mL Mouth/Throat BID   clonazePAM  0.5 mg Oral Q8H   cloNIDine  0.1 mg Oral Q8H   dextrose  1 ampule Intravenous Once   docusate  100 mg Oral Q12H   enoxaparin (LOVENOX) injection  40 mg Subcutaneous QHS   famotidine  20 mg Oral QHS   fentaNYL  1 patch Transdermal Q72H   gabapentin  300 mg Oral BID   HYDROcodone-acetaminophen  1 tablet Oral BID   insulin aspart  0-9 Units Subcutaneous Q4H   insulin detemir   10 Units Subcutaneous QHS   multivitamin with minerals  1 tablet Oral Daily   olopatadine  1 drop Both Eyes BID   senna  1 tablet Oral QHS   sertraline  100 mg Oral QHS   sodium chloride flush  10-40 mL Intracatheter Q12H   Continuous Infusions:  sodium chloride 75 mL/hr at 07/08/19 0400   meropenem (MERREM) IV 1 g (07/08/19 0607)   vancomycin 1,000 mg (07/08/19 0105)     LOS: 1 day    Time spent:40 min    Luis Orr, Geraldo Docker, MD [facility] Pager [phone]  If 7PM-7AM, please contact night-coverage [email or web address] Password [facility] 07/08/2019, 9:23 AM

## 2019-07-08 NOTE — Progress Notes (Addendum)
Initial Nutrition Assessment  DOCUMENTATION CODES:   Not applicable  INTERVENTION:   -D/c Ensure Enlive Initiate Osmolite 1.5 @ 20 ml/hr via PEG and increase by 10 ml every  4 hours to goal rate of 50 ml/hr.   30 ml Prostat TID.    Tube feeding regimen provides 2100 kcal (100% of needs), 120 grams of protein, and 914 ml of H2O.  NUTRITION DIAGNOSIS:   Increased nutrient needs related to wound healing as evidenced by estimated needs.  GOAL:   Patient will meet greater than or equal to 90% of their needs  MONITOR:   Diet advancement, Weight trends, Skin, Labs, I & O's  REASON FOR ASSESSMENT:   Consult Enteral/tube feeding initiation and management  ASSESSMENT:   69 year old male trach/vent dependent from [facility] with abdominal pain. Found to have fecal impaction, UTI, pneumonia. Admitted to hospitalists service.   Pt admitted with sepsis secondary to UTI.  Patient is on ventilator support via trach MV: 7.7 L/min Temp (24hrs), Avg:98.4 F (36.9 C), Min:98.2 F (36.8 C), Max:98.5 F (36.9 C)  Reviewed I/O's: 2 ml x 24 hours and -852 ml since admission  UOP: 1.3 L x 24 hours  Pt resides at [facility]. He relies on vent support via trach. Pt also to communicate via speech and hand gestures. He confirms that he receives the majority of his nutrition via PEG, however, has been completing oral food trials with SLP PTA.   Pt is unsure about weight loss. Reviewed wt hx; pt has experienced a 8.3% wt loss over the past 6 weeks, which is significant for time frame. However, RD questions accuracy of weights, as weight readings are extremely variable in chart.   Lab Results  Component Value Date   HGBA1C 7.0 (H) 07/07/2019   PTA DM medications are 10 units insulin detemir at bedtime 2-10 units insulin regular 4 times daily.   Labs reviewed: Phos: 2.4, Mg and K WDL. CBGS: 108-117 (inpatient orders for glycemic control are 0-9 units inuslin asprt every 4 hours and 10 units  insulin detemir daily at bedtime).   NUTRITION - FOCUSED PHYSICAL EXAM:    Most Recent Value  Orbital Region No depletion  Upper Arm Region No depletion  Thoracic and Lumbar Region No depletion  Buccal Region No depletion  Temple Region No depletion  Clavicle Bone Region No depletion  Clavicle and Acromion Bone Region No depletion  Scapular Bone Region No depletion  Dorsal Hand No depletion  Patellar Region No depletion  Anterior Thigh Region No depletion  Posterior Calf Region No depletion  Edema (RD Assessment) Mild  Hair Reviewed  Eyes Reviewed  Mouth Reviewed  Skin Reviewed  Nails Reviewed       Diet Order:   Diet Order            Diet NPO time specified  Diet effective now                 EDUCATION NEEDS:   Education needs have been addressed  Skin:  Skin Assessment: Skin Integrity Issues: Skin Integrity Issues:: Stage III Stage III: sacrum  Last BM:  07/08/19  Height:   Ht Readings from Last 1 Encounters:  07/06/19 5\' 6"  (1.676 m)    Weight:   Wt Readings from Last 1 Encounters:  07/06/19 74.8 kg   BMI:  Body mass index is 26.63 kg/m.  Estimated Nutritional Needs:   Kcal:  2050-2250  Protein:  115-130 grams  Fluid:  > 2  Ricka Burdock, RD, LDN, [place] Registered Dietitian II Certified Diabetes Care and Education Specialist Please refer to [facility] for RD and/or RD on-call/weekend/after hours pager

## 2019-07-09 LAB — COMPREHENSIVE METABOLIC PANEL
ALT: 25 U/L (ref 0–44)
AST: 20 U/L (ref 15–41)
Albumin: 2.6 g/dL — ABNORMAL LOW (ref 3.5–5.0)
Alkaline Phosphatase: 60 U/L (ref 38–126)
Anion gap: 10 (ref 5–15)
BUN: 18 mg/dL (ref 8–23)
CO2: 21 mmol/L — ABNORMAL LOW (ref 22–32)
Calcium: 8.7 mg/dL — ABNORMAL LOW (ref 8.9–10.3)
Chloride: 110 mmol/L (ref 98–111)
Creatinine, Ser: 0.34 mg/dL — ABNORMAL LOW (ref 0.61–1.24)
GFR calc Af Amer: >60 mL/min (ref >60)
GFR calc non Af Amer: >60 mL/min (ref >60)
Glucose, Bld: 233 mg/dL — ABNORMAL HIGH (ref 70–99)
Potassium: 3.1 mmol/L — ABNORMAL LOW (ref 3.5–5.1)
Sodium: 141 mmol/L (ref 135–145)
Total Bilirubin: 0.8 mg/dL (ref 0.3–1.2)
Total Protein: 6.9 g/dL (ref 6.5–8.1)

## 2019-07-09 LAB — CBC WITH DIFFERENTIAL/PLATELET
Abs Immature Granulocytes: 0.03 10*3/uL (ref 0.00–0.07)
Basophils Absolute: 0.1 10*3/uL (ref 0.0–0.1)
Basophils Relative: 1 %
Eosinophils Absolute: 0.2 10*3/uL (ref 0.0–0.5)
Eosinophils Relative: 3 %
HCT: 31.4 % — ABNORMAL LOW (ref 39.0–52.0)
Hemoglobin: 10.4 g/dL — ABNORMAL LOW (ref 13.0–17.0)
Immature Granulocytes: 0 %
Lymphocytes Relative: 28 %
Lymphs Abs: 2 10*3/uL (ref 0.7–4.0)
MCH: 24 pg — ABNORMAL LOW (ref 26.0–34.0)
MCHC: 33.1 g/dL (ref 30.0–36.0)
MCV: 72.5 fL — ABNORMAL LOW (ref 80.0–100.0)
Monocytes Absolute: 0.8 10*3/uL (ref 0.1–1.0)
Monocytes Relative: 11 %
Neutro Abs: 4 10*3/uL (ref 1.7–7.7)
Neutrophils Relative %: 57 %
Platelets: 332 10*3/uL (ref 150–400)
RBC: 4.33 MIL/uL (ref 4.22–5.81)
RDW: 15.9 % — ABNORMAL HIGH (ref 11.5–15.5)
WBC: 7 10*3/uL (ref 4.0–10.5)
nRBC: 0 % (ref 0.0–0.2)

## 2019-07-09 LAB — CULTURE, RESPIRATORY W GRAM STAIN: Culture: NORMAL

## 2019-07-09 LAB — URINE CULTURE: Culture: >=100000 — AB

## 2019-07-09 LAB — GLUCOSE, CAPILLARY
Glucose-Capillary: 144 mg/dL — ABNORMAL HIGH (ref 70–99)
Glucose-Capillary: 175 mg/dL — ABNORMAL HIGH (ref 70–99)
Glucose-Capillary: 178 mg/dL — ABNORMAL HIGH (ref 70–99)
Glucose-Capillary: 200 mg/dL — ABNORMAL HIGH (ref 70–99)
Glucose-Capillary: 206 mg/dL — ABNORMAL HIGH (ref 70–99)
Glucose-Capillary: 214 mg/dL — ABNORMAL HIGH (ref 70–99)
Glucose-Capillary: 248 mg/dL — ABNORMAL HIGH (ref 70–99)

## 2019-07-09 LAB — MAGNESIUM: Magnesium: 1.8 mg/dL (ref 1.7–2.4)

## 2019-07-09 LAB — PHOSPHORUS: Phosphorus: 2 mg/dL — ABNORMAL LOW (ref 2.5–4.6)

## 2019-07-09 MED ORDER — SCOPOLAMINE 1 MG/3DAYS TD PT72
1.0000 | MEDICATED_PATCH | TRANSDERMAL | Status: DC
  Administered 2019-07-09 – 2019-07-12 (×2): 1.5 mg via TRANSDERMAL
  Filled 2019-07-09 (×2): qty 1

## 2019-07-09 MED ORDER — INSULIN ASPART 100 UNIT/ML ~~LOC~~ SOLN
5.0000 [IU] | Freq: Three times a day (TID) | SUBCUTANEOUS | Status: DC
  Administered 2019-07-09 – 2019-07-12 (×8): 5 [IU] via SUBCUTANEOUS

## 2019-07-09 NOTE — Progress Notes (Signed)
PROGRESS NOTE    Luis Orr  SWF:[id number] DOB: [birth year] DOA: 07/06/2019 PCP: Townsend Roger, MD     Brief Narrative:  69 y.o. BM PMHx   anxiety, diabetes type 2 controlled with complication, HTN, urine retention, chronic respiratory failure on vent.  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  Pt is Vent dependent as a result since end of 2018.  Pt has been residing in [facility] on chronic vent since Jan 2019.  Despite the quadriplegia and respiratory failure, pt is apparently able to urinate without the use of a catheter.  Pt sent in to the ED today with c/o abd pain.  Concern for worsening abd distention and "ileus".  Patient seems AAOx3, but history taking is difficult secondary to pt being on chronic mechanical ventilator and unable to make audible words (have to lip read patient).  Thus most of the history is confirmed from his wife.   Subjective: 6/26 afebrile overnight, alert nods yes and no to questions.  Initially did not want to allow blood draws but was able to convince patient that was necessary for treatment.  RN reports copious amounts of clear secretions from trach.     Assessment & Plan: Covid vaccination; positive vaccination   Principal Problem:   Sepsis secondary to UTI [facility]) Active Problems:   Functional quadriplegia (HCC)   Chronic respiratory failure requiring continuous mechanical ventilation through tracheostomy ([place])   Fecal impaction (HCC)   HTN (hypertension)   DM2 (diabetes mellitus, type 2) ([place])  positive coag negative staph 1/2 most likely contaminant -Positive methicillin-resistant Staphylococcus awaiting speciation -DC vancomycin trach aspirate WNL and blood culture most likely contaminant  Sepsis UTI/positive Klebsiella pneumoniae -Continue meropenem x5 days  -D5-0.9% saline 3ml/hr  Chronic ventilator/VAP? -Per PCCM -Trach aspirate being reintubated -6/26 tracheostomy secretions do not appear to be  infective.  Scopolamine patch, monitor carefully to ensure secretions do not become too thick to suction.  Fecal impaction  -Per patient resolved -Continue n.p.o. if patient does well overnight will start with full liquid diet per PEG tube in a.m.  Essential HTN -Amlodipine 10 mg daily -Clonidine 0.1 mg TID  Diabetes type 2 hold with complication -2/20 hemoglobin A1c 7.0 -Levemir 10 units daily -6/26 NovoLog 5 units TID -Sensitive SSI  Hypokalemia -resolved  Stage III sacral decubitus ulcer Pressure Injury 07/07/19 Sacrum Mid Stage 3 -  Full thickness tissue loss. Subcutaneous fat may be visible but bone, tendon or muscle are NOT exposed. (Active)  07/07/19 2030  Location: Sacrum  Location Orientation: Mid  Staging: Stage 3 -  Full thickness tissue loss. Subcutaneous fat may be visible but bone, tendon or muscle are NOT exposed.  Wound Description (Comments):   Present on Admission: Yes        DVT prophylaxis: Lovenox Code Status: Full Family Communication:  Status is: Inpatient    Dispo: The patient is from: [facility]              Anticipated d/c is to: [facility]              Anticipated d/c date is: 6/27              Patient currently unstable staph bacteremia      Consultants:  PCCM   Procedures/Significant Events:     I have personally reviewed and interpreted all radiology studies and my findings are as above.  VENTILATOR SETTINGS: Mode; PRVC 6/26 Vt; 568ml Set rate; 16 FiO2; 30% PEEP; 5 SPO2; 100%  Cultures 6/23 blood positive coag negative staph 1/2 most likely contaminant 6/23 blood NGTD 6/23 urine positive Klebsiella pneumoniae 6/24 tracheal aspirate consistent with normal respiratory flora    Antimicrobials: Anti-infectives (From admission, onward)   Start     Ordered Stop   07/07/19 1300  vancomycin (VANCOCIN) IVPB 1000 mg/200 mL premix  Status:  Discontinued        07/07/19 0048 07/09/19 0918   07/07/19 0200  meropenem (MERREM)  1 g in sodium chloride 0.9 % 100 mL IVPB     Discontinue     07/07/19 0048     07/07/19 0100  piperacillin-tazobactam (ZOSYN) IVPB 3.375 g  Status:  Discontinued        07/06/19 2304 07/07/19 0041   07/07/19 0100  vancomycin (VANCOREADY) IVPB 1500 mg/300 mL        07/07/19 0048 07/07/19 0343   07/06/19 1900  piperacillin-tazobactam (ZOSYN) IVPB 3.375 g        07/06/19 1849 07/06/19 2000       Devices    LINES / TUBES:      Continuous Infusions: . dextrose 5 % and 0.9% NaCl 75 mL/hr at 07/08/19 1628  . feeding supplement (OSMOLITE 1.5 CAL) 1,000 mL (07/08/19 1728)  . meropenem (MERREM) IV 1 g (07/09/19 0553)  . vancomycin 1,000 mg (07/09/19 0530)     Objective: Vitals:   07/09/19 0415 07/09/19 0500 07/09/19 0744 07/09/19 0833  BP: (!) 141/73  (!) 135/91 (!) 135/91  Pulse: (!) 58  62 71  Resp: 16  16 16   Temp: 98.5 F (36.9 C)  98 F (36.7 C)   TempSrc: Oral  Oral   SpO2: 96%  100% 100%  Weight:  73.3 kg    Height:        Intake/Output Summary (Last 24 hours) at 07/09/2019 0913 Last data filed at 07/09/2019 0419 Gross per 24 hour  Intake 1701.67 ml  Output 975 ml  Net 726.67 ml   Filed Weights   07/06/19 1657 07/06/19 1659 07/09/19 0500  Weight: 65.8 kg 74.8 kg 73.3 kg   Physical Exam:  General: Alert, follows all commands, nods yes and no to questions as well as [employer].  Positive chronic respiratory distress Eyes: negative scleral hemorrhage, negative anisocoria, negative icterus ENT: Negative Runny nose, negative gingival bleeding, Neck:  Negative scars, masses, torticollis, lymphadenopathy, JVD #6 cuffed Shiley in place covered and clean negative sign of infection Lungs: Clear to auscultation bilaterally without wheezes or crackles Cardiovascular: Regular rate and rhythm without murmur gallop or rub normal S1 and S2 Abdomen: negative abdominal pain, nondistended, positive soft, bowel sounds, no rebound, no ascites, no appreciable mass Extremities:  No significant cyanosis, clubbing, or edema bilateral lower extremities Skin: Negative rashes, lesions, ulcers Psychiatric:  Negative depression, negative anxiety, negative fatigue, negative mania  Central nervous system: Patient nod yes and no to questions otherwise unable to move extremities.   .     Data Reviewed: Care during the described time interval was provided by me .  I have reviewed this patient's available data, including medical history, events of note, physical examination, and all test results as part of my evaluation.  CBC: Recent Labs  Lab 07/06/19 1754 07/07/19 0104 07/07/19 0400 07/08/19 0510  WBC 16.6*  --  14.6* 8.9  NEUTROABS  --   --   --  6.0  HGB 13.1 11.6* 10.5* 11.4*  HCT 41.4 34.0* 31.0* 34.3*  MCV 75.7*  --  72.9* 72.2*  PLT  264  --  316 638   Basic Metabolic Panel: Recent Labs  Lab 07/06/19 1754 07/07/19 0104 07/07/19 0400 07/08/19 0510  NA 141 146* 143 143  K 4.0 3.2* 3.1* 3.7  CL 105  --  108 109  CO2 18*  --  23 22  GLUCOSE 242*  --  174* 96  BUN 29*  --  23 18  CREATININE 0.62  --  0.47* 0.47*  CALCIUM 9.5  --  9.0 9.2  MG  --   --   --  2.0  PHOS  --   --   --  2.4*   GFR: Estimated Creatinine Clearance: 78.6 mL/min (A) (by C-G formula based on SCr of 0.47 mg/dL (L)). Liver Function Tests: Recent Labs  Lab 07/06/19 1754 07/08/19 0510  AST 44* 19  ALT 36 24  ALKPHOS 86 64  BILITOT 1.4* 1.4*  PROT 8.5* 7.5  ALBUMIN 3.5 2.9*   Recent Labs  Lab 07/06/19 1754  LIPASE 26   No results for input(s): AMMONIA in the last 168 hours. Coagulation Profile: Recent Labs  Lab 07/06/19 1922  INR 1.3*   Cardiac Enzymes: No results for input(s): CKTOTAL, CKMB, CKMBINDEX, TROPONINI in the last 168 hours. BNP (last 3 results) No results for input(s): PROBNP in the last 8760 hours. HbA1C: Recent Labs    07/07/19 0400  HGBA1C 7.0*   CBG: Recent Labs  Lab 07/08/19 1542 07/08/19 2045 07/09/19 0017 07/09/19 0413  07/09/19 0743  GLUCAP 164* 204* 178* 200* 248*   Lipid Profile: No results for input(s): CHOL, HDL, LDLCALC, TRIG, CHOLHDL, LDLDIRECT in the last 72 hours. Thyroid Function Tests: No results for input(s): TSH, T4TOTAL, FREET4, T3FREE, THYROIDAB in the last 72 hours. Anemia Panel: No results for input(s): VITAMINB12, FOLATE, FERRITIN, TIBC, IRON, RETICCTPCT in the last 72 hours. Sepsis Labs: Recent Labs  Lab 07/06/19 1849 07/06/19 1851 07/07/19 0400  LATICACIDVEN 1.4 1.4 0.9    Recent Results (from the past 240 hour(s))  Urine culture     Status: Abnormal (Preliminary result)   Collection Time: 07/06/19  6:47 PM   Specimen: In/Out Cath Urine  Result Value Ref Range Status   Specimen Description IN/OUT CATH URINE  Final   Special Requests NONE  Final   Culture (A)  Final    >=100,000 COLONIES/mL KLEBSIELLA PNEUMONIAE SUSCEPTIBILITIES TO FOLLOW Performed at [facility], [place] [address] [place], [place] [address]    Report Status PENDING  Incomplete  Blood Culture (routine x 2)     Status: Abnormal   Collection Time: 07/06/19  6:53 PM   Specimen: BLOOD  Result Value Ref Range Status   Specimen Description BLOOD SITE NOT SPECIFIED  Final   Special Requests   Final    BOTTLES DRAWN AEROBIC AND ANAEROBIC Blood Culture adequate volume   Culture  Setup Time   Final    GRAM POSITIVE COCCI IN CLUSTERS AEROBIC BOTTLE ONLY CRITICAL RESULT CALLED TO, READ BACK BY AND VERIFIED WITH: ABjorn Loser 16:35 07/07/19 ([email or web address])    Culture (A)  Final    STAPHYLOCOCCUS SPECIES (COAGULASE NEGATIVE) THE SIGNIFICANCE OF ISOLATING THIS ORGANISM FROM A SINGLE SET OF BLOOD CULTURES WHEN MULTIPLE SETS ARE DRAWN IS UNCERTAIN. PLEASE NOTIFY THE MICROBIOLOGY DEPARTMENT WITHIN ONE WEEK IF SPECIATION AND SENSITIVITIES ARE REQUIRED. Performed at [facility], [place] [address], [place], [place] [address]    Report Status 07/08/2019 FINAL  Final  Blood Culture ID Panel (Reflexed)  Status: Abnormal    Collection Time: 07/06/19  6:53 PM  Result Value Ref Range Status   Enterococcus species NOT DETECTED NOT DETECTED Final   Listeria monocytogenes NOT DETECTED NOT DETECTED Final   Staphylococcus species DETECTED (A) NOT DETECTED Final    Comment: Methicillin (oxacillin) resistant coagulase negative staphylococcus. Possible blood culture contaminant (unless isolated from more than one blood culture draw or clinical case suggests pathogenicity). No antibiotic treatment is indicated for blood  culture contaminants. CRITICAL RESULT CALLED TO, READ BACK BY AND VERIFIED WITH: Reece Packer PharmD 16:35 07/07/19 ([email or web address])    Staphylococcus aureus (BCID) NOT DETECTED NOT DETECTED Final   Methicillin resistance DETECTED (A) NOT DETECTED Final    Comment: CRITICAL RESULT CALLED TO, READ BACK BY AND VERIFIED WITH: Reece Packer PharmD 16:35 07/07/19 ([email or web address])    Streptococcus species NOT DETECTED NOT DETECTED Final   Streptococcus agalactiae NOT DETECTED NOT DETECTED Final   Streptococcus pneumoniae NOT DETECTED NOT DETECTED Final   Streptococcus pyogenes NOT DETECTED NOT DETECTED Final   Acinetobacter baumannii NOT DETECTED NOT DETECTED Final   Enterobacteriaceae species NOT DETECTED NOT DETECTED Final   Enterobacter cloacae complex NOT DETECTED NOT DETECTED Final   Escherichia coli NOT DETECTED NOT DETECTED Final   Klebsiella oxytoca NOT DETECTED NOT DETECTED Final   Klebsiella pneumoniae NOT DETECTED NOT DETECTED Final   Proteus species NOT DETECTED NOT DETECTED Final   Serratia marcescens NOT DETECTED NOT DETECTED Final   Haemophilus influenzae NOT DETECTED NOT DETECTED Final   Neisseria meningitidis NOT DETECTED NOT DETECTED Final   Pseudomonas aeruginosa NOT DETECTED NOT DETECTED Final   Candida albicans NOT DETECTED NOT DETECTED Final   Candida glabrata NOT DETECTED NOT DETECTED Final   Candida krusei NOT DETECTED NOT DETECTED Final   Candida parapsilosis NOT DETECTED NOT DETECTED Final    Candida tropicalis NOT DETECTED NOT DETECTED Final    Comment: Performed at [facility], [place]. [address] [place], [place] [address]  Blood Culture (routine x 2)     Status: None (Preliminary result)   Collection Time: 07/06/19  6:57 PM   Specimen: BLOOD  Result Value Ref Range Status   Specimen Description BLOOD SITE NOT SPECIFIED  Final   Special Requests   Final    BOTTLES DRAWN AEROBIC AND ANAEROBIC Blood Culture adequate volume   Culture   Final    NO GROWTH 3 DAYS Performed at [facility], [address] [place], [place] [address]    Report Status PENDING  Incomplete  SARS Coronavirus 2 by RT PCR (hospital order, performed in [facility] hospital lab) Nasopharyngeal Nasopharyngeal Swab     Status: None   Collection Time: 07/06/19 10:30 PM   Specimen: Nasopharyngeal Swab  Result Value Ref Range Status   SARS Coronavirus 2 NEGATIVE NEGATIVE Final    Comment: (NOTE) SARS-CoV-2 target nucleic acids are NOT DETECTED.  The SARS-CoV-2 RNA is generally detectable in upper and lower respiratory specimens during the acute phase of infection. The lowest concentration of SARS-CoV-2 viral copies this assay can detect is 250 copies / mL. A negative result does not preclude SARS-CoV-2 infection and should not be used as the sole basis for treatment or other patient management decisions.  A negative result may occur with improper specimen collection / handling, submission of specimen other than nasopharyngeal swab, presence of viral mutation(s) within the areas targeted by this assay, and inadequate number of viral copies (<250 copies / mL). A negative result must be combined  with clinical observations, patient history, and epidemiological information.  Fact Sheet for Patients:   StrictlyIdeas.no  Fact Sheet for Healthcare Providers: BankingDealers.co.za  This test is not yet approved or  cleared by the [place] FDA  and has been authorized for detection and/or diagnosis of SARS-CoV-2 by FDA under an Emergency Use Authorization (EUA).  This EUA will remain in effect (meaning this test can be used) for the duration of the COVID-19 declaration under Section 564(b)(1) of the Act, 21 U.S.C. section 360bbb-3(b)(1), unless the authorization is terminated or revoked sooner.  Performed at [facility], [place] [address], [place], [place] [address]   Culture, respiratory (non-expectorated)     Status: None   Collection Time: 07/07/19  4:25 AM   Specimen: Tracheal Aspirate; Respiratory  Result Value Ref Range Status   Specimen Description TRACHEAL ASPIRATE  Final   Special Requests NONE  Final   Gram Stain   Final    FEW WBC PRESENT, PREDOMINANTLY PMN NO ORGANISMS SEEN    Culture   Final    FEW Consistent with normal respiratory flora. Performed at [facility], [place] [address], [place], [place] [address]    Report Status 07/09/2019 FINAL  Final  MRSA PCR Screening     Status: None   Collection Time: 07/07/19  8:49 PM   Specimen: Nasopharyngeal  Result Value Ref Range Status   MRSA by PCR NEGATIVE NEGATIVE Final    Comment:        The GeneXpert MRSA Assay (FDA approved for NASAL specimens only), is one component of a comprehensive MRSA colonization surveillance program. It is not intended to diagnose MRSA infection nor to guide or monitor treatment for MRSA infections. Performed at [facility], [place] [address], [place], [place] [address]          Radiology Studies: DG Chest Port 1 View  Result Date: 07/08/2019 CLINICAL DATA:  Abnormal respiration, tracheostomy EXAM: PORTABLE CHEST 1 VIEW COMPARISON:  Radiograph 07/06/2019 FINDINGS: Tracheostomy tube terminates in the mid trachea, approximately 5 cm from the carina. Telemetry leads and ventilator tubing overlie the chest. There are layering bilateral pleural effusions and basilar areas of atelectasis. More patchy opacity in  the right infrahilar lung could reflect further atelectatic change or developing airspace disease. No pneumothorax. Stable cardiomediastinal contours. No acute osseous or soft tissue abnormality. Degenerative changes are present in the imaged spine and shoulders. IMPRESSION: 1. Layering bilateral pleural effusions with basilar atelectasis. 2. More patchy opacity in the right infrahilar lung could reflect further atelectatic change or developing airspace disease. Electronically Signed   By: Lovena Le M.D.   On: 07/08/2019 06:31        Scheduled Meds: . amLODipine  10 mg Oral Daily  . chlorhexidine  5 mL Mouth/Throat BID  . clonazePAM  0.5 mg Per Tube Q8H  . cloNIDine  0.1 mg Per Tube Q8H  . docusate  100 mg Per Tube Q12H  . enoxaparin (LOVENOX) injection  40 mg Subcutaneous QHS  . famotidine  20 mg Per Tube QHS  . feeding supplement (PRO-STAT SUGAR FREE 64)  30 mL Per Tube TID  . fentaNYL  1 patch Transdermal Q72H  . gabapentin  300 mg Per Tube BID  . HYDROcodone-acetaminophen  1 tablet Per Tube BID  . insulin aspart  0-9 Units Subcutaneous Q4H  . insulin detemir  10 Units Subcutaneous QHS  . multivitamin with minerals  1 tablet Oral Daily  . olopatadine  1  drop Both Eyes BID  . senna  1 tablet Per Tube QHS  . sertraline  100 mg Per Tube QHS  . sodium chloride flush  10-40 mL Intracatheter Q12H   Continuous Infusions: . dextrose 5 % and 0.9% NaCl 75 mL/hr at 07/08/19 1628  . feeding supplement (OSMOLITE 1.5 CAL) 1,000 mL (07/08/19 1728)  . meropenem (MERREM) IV 1 g (07/09/19 0553)  . vancomycin 1,000 mg (07/09/19 0530)     LOS: 2 days    Time spent:40 min    Evona Westra, Geraldo Docker, MD [facility] Pager [phone]  If 7PM-7AM, please contact night-coverage [email or web address] Password [facility] 07/09/2019, 9:13 AM

## 2019-07-10 LAB — COMPREHENSIVE METABOLIC PANEL
ALT: 42 U/L (ref 0–44)
AST: 48 U/L — ABNORMAL HIGH (ref 15–41)
Albumin: 2.5 g/dL — ABNORMAL LOW (ref 3.5–5.0)
Alkaline Phosphatase: 60 U/L (ref 38–126)
Anion gap: 10 (ref 5–15)
BUN: 22 mg/dL (ref 8–23)
CO2: 23 mmol/L (ref 22–32)
Calcium: 8.8 mg/dL — ABNORMAL LOW (ref 8.9–10.3)
Chloride: 111 mmol/L (ref 98–111)
Creatinine, Ser: 0.35 mg/dL — ABNORMAL LOW (ref 0.61–1.24)
GFR calc Af Amer: >60 mL/min (ref >60)
GFR calc non Af Amer: >60 mL/min (ref >60)
Glucose, Bld: 189 mg/dL — ABNORMAL HIGH (ref 70–99)
Potassium: 3.7 mmol/L (ref 3.5–5.1)
Sodium: 144 mmol/L (ref 135–145)
Total Bilirubin: 0.8 mg/dL (ref 0.3–1.2)
Total Protein: 6.7 g/dL (ref 6.5–8.1)

## 2019-07-10 LAB — CBC WITH DIFFERENTIAL/PLATELET
Abs Immature Granulocytes: 0.03 K/uL (ref 0.00–0.07)
Basophils Absolute: 0 K/uL (ref 0.0–0.1)
Basophils Relative: 1 %
Eosinophils Absolute: 0.3 K/uL (ref 0.0–0.5)
Eosinophils Relative: 5 %
HCT: 31.2 % — ABNORMAL LOW (ref 39.0–52.0)
Hemoglobin: 10.1 g/dL — ABNORMAL LOW (ref 13.0–17.0)
Immature Granulocytes: 1 %
Lymphocytes Relative: 27 %
Lymphs Abs: 1.6 K/uL (ref 0.7–4.0)
MCH: 23.8 pg — ABNORMAL LOW (ref 26.0–34.0)
MCHC: 32.4 g/dL (ref 30.0–36.0)
MCV: 73.6 fL — ABNORMAL LOW (ref 80.0–100.0)
Monocytes Absolute: 0.7 K/uL (ref 0.1–1.0)
Monocytes Relative: 12 %
Neutro Abs: 3.4 K/uL (ref 1.7–7.7)
Neutrophils Relative %: 54 %
Platelets: 309 K/uL (ref 150–400)
RBC: 4.24 MIL/uL (ref 4.22–5.81)
RDW: 15.9 % — ABNORMAL HIGH (ref 11.5–15.5)
WBC: 6.1 K/uL (ref 4.0–10.5)
nRBC: 0 % (ref 0.0–0.2)

## 2019-07-10 LAB — GLUCOSE, CAPILLARY
Glucose-Capillary: 167 mg/dL — ABNORMAL HIGH (ref 70–99)
Glucose-Capillary: 151 mg/dL — ABNORMAL HIGH (ref 70–99)
Glucose-Capillary: 141 mg/dL — ABNORMAL HIGH (ref 70–99)
Glucose-Capillary: 134 mg/dL — ABNORMAL HIGH (ref 70–99)
Glucose-Capillary: 126 mg/dL — ABNORMAL HIGH (ref 70–99)
Glucose-Capillary: 145 mg/dL — ABNORMAL HIGH (ref 70–99)

## 2019-07-10 LAB — PHOSPHORUS: Phosphorus: 2.2 mg/dL — ABNORMAL LOW (ref 2.5–4.6)

## 2019-07-10 LAB — MAGNESIUM: Magnesium: 1.8 mg/dL (ref 1.7–2.4)

## 2019-07-10 MED ORDER — LISINOPRIL 5 MG PO TABS
5.0000 mg | ORAL_TABLET | Freq: Every day | ORAL | Status: DC
  Administered 2019-07-10 – 2019-07-12 (×3): 5 mg via ORAL
  Filled 2019-07-10 (×3): qty 1

## 2019-07-10 NOTE — Progress Notes (Signed)
PROGRESS NOTE    Luis Orr  YTK:[id number] DOB: 23-Mar-1950 DOA: 07/06/2019 PCP: Townsend Roger, MD     Brief Narrative:  69 y.o. BM PMHx   anxiety, diabetes type 2 controlled with complication, HTN, urine retention, chronic respiratory failure on vent.  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  Pt is Vent dependent as a result since end of 2018.  Pt has been residing in [facility] on chronic vent since Jan 2019.  Despite the quadriplegia and respiratory failure, pt is apparently able to urinate without the use of a catheter.  Pt sent in to the ED today with c/o abd pain.  Concern for worsening abd distention and "ileus".  Patient seems AAOx3, but history taking is difficult secondary to pt being on chronic mechanical ventilator and unable to make audible words (have to lip read patient).  Thus most of the history is confirmed from his wife.   Subjective: 6/27 afebrile last 24 hours, nods yes and no to questions.  Secretions improved   Assessment & Plan: Covid vaccination; positive vaccination   Principal Problem:   Sepsis secondary to UTI [facility]) Active Problems:   Functional quadriplegia (HCC)   Chronic respiratory failure requiring continuous mechanical ventilation through tracheostomy ([place])   Fecal impaction (HCC)   HTN (hypertension)   DM2 (diabetes mellitus, type 2) ([place])  positive coag negative staph 1/2 most likely contaminant -Positive methicillin-resistant Staphylococcus awaiting speciation -DC vancomycin trach aspirate WNL and blood culture most likely contaminant  Sepsis UTI/positive Klebsiella pneumoniae -Continue meropenem x5 days  -D5-0.9% saline 38ml/hr -6/27 patient should be stable for discharge on 6/29 at completion of antibiotics  Chronic ventilator/VAP? -Per PCCM -Trach aspirate being reintubated -6/26 tracheostomy secretions do not appear to be infective.  Scopolamine patch, monitor carefully to ensure secretions do not  become too thick to suction.  Fecal impaction  -Per patient resolved -Continue tube feeds  Essential HTN -Amlodipine 10 mg daily -Clonidine 0.1 mg TID -6/27 lisinopril 5 mg daily  Diabetes type 2 hold with complication -5/57 hemoglobin A1c 7.0 -Levemir 10 units daily -6/26 NovoLog 5 units TID -Sensitive SSI  Hypokalemia -resolved  Stage III sacral decubitus ulcer Pressure Injury 07/07/19 Sacrum Mid Stage 3 -  Full thickness tissue loss. Subcutaneous fat may be visible but bone, tendon or muscle are NOT exposed. (Active)  07/07/19 2030  Location: Sacrum  Location Orientation: Mid  Staging: Stage 3 -  Full thickness tissue loss. Subcutaneous fat may be visible but bone, tendon or muscle are NOT exposed.  Wound Description (Comments):   Present on Admission: Yes        DVT prophylaxis: Lovenox Code Status: Full Family Communication:  Status is: Inpatient    Dispo: The patient is from: [facility]              Anticipated d/c is to: [facility]              Anticipated d/c date is: 6/29              Patient currently unstable staph bacteremia      Consultants:  PCCM   Procedures/Significant Events:     I have personally reviewed and interpreted all radiology studies and my findings are as above.  VENTILATOR SETTINGS: Mode; PRVC 6/27 Vt; 513ml Set rate; 16 FiO2; 30% PEEP; 5 SPO2; 100%   Cultures 6/23 blood positive coag negative staph 1/2 most likely contaminant 6/23 blood NGTD 6/23 urine positive Klebsiella pneumoniae 6/24 tracheal aspirate consistent with  normal respiratory flora    Antimicrobials: Anti-infectives (From admission, onward)   Start     Ordered Stop   07/07/19 1300  vancomycin (VANCOCIN) IVPB 1000 mg/200 mL premix  Status:  Discontinued        07/07/19 0048 07/09/19 0918   07/07/19 0200  meropenem (MERREM) 1 g in sodium chloride 0.9 % 100 mL IVPB     Discontinue     07/07/19 0048     07/07/19 0100  piperacillin-tazobactam (ZOSYN)  IVPB 3.375 g  Status:  Discontinued        07/06/19 2304 07/07/19 0041   07/07/19 0100  vancomycin (VANCOREADY) IVPB 1500 mg/300 mL        07/07/19 0048 07/07/19 0343   07/06/19 1900  piperacillin-tazobactam (ZOSYN) IVPB 3.375 g        07/06/19 1849 07/06/19 2000       Devices    LINES / TUBES:      Continuous Infusions: . dextrose 5 % and 0.9% NaCl 75 mL/hr at 07/09/19 1915  . feeding supplement (OSMOLITE 1.5 CAL) 1,000 mL (07/09/19 2114)  . meropenem (MERREM) IV 1 g (07/10/19 0536)     Objective: Vitals:   07/10/19 0743 07/10/19 0743 07/10/19 0748 07/10/19 1211  BP:  (!) 144/81 (!) 170/83   Pulse:  (!) 57 61 77  Resp:  18 18 19   Temp:  97.8 F (36.6 C) 98.2 F (36.8 C)   TempSrc:  Oral    SpO2: 100% 100% 100% 96%  Weight:      Height:        Intake/Output Summary (Last 24 hours) at 07/10/2019 1213 Last data filed at 07/10/2019 0536 Gross per 24 hour  Intake --  Output 450 ml  Net -450 ml   Filed Weights   07/06/19 1659 07/09/19 0500 07/10/19 0500  Weight: 74.8 kg 73.3 kg 76.1 kg   Physical Exam:  General: Alert, follows all commands, nods yes and no to questions as well as [employer].  Positive chronic respiratory distress Eyes: negative scleral hemorrhage, negative anisocoria, negative icterus ENT: Negative Runny nose, negative gingival bleeding, Neck:  Negative scars, masses, torticollis, lymphadenopathy, JVD #6 cuffed Shiley in place covered and clean negative sign of infection Lungs: Clear to auscultation bilaterally without wheezes or crackles Cardiovascular: Regular rate and rhythm without murmur gallop or rub normal S1 and S2 Abdomen: negative abdominal pain, nondistended, positive soft, bowel sounds, no rebound, no ascites, no appreciable mass Extremities: No significant cyanosis, clubbing, or edema bilateral lower extremities Skin: Negative rashes, lesions, ulcers Psychiatric:  Negative depression, negative anxiety, negative fatigue, negative  mania  Central nervous system: Patient nod yes and no to questions otherwise unable to move extremities.   .     Data Reviewed: Care during the described time interval was provided by me .  I have reviewed this patient's available data, including medical history, events of note, physical examination, and all test results as part of my evaluation.  CBC: Recent Labs  Lab 07/06/19 1754 07/06/19 1754 07/07/19 0104 07/07/19 0400 07/08/19 0510 07/09/19 1315 07/10/19 0245  WBC 16.6*  --   --  14.6* 8.9 7.0 6.1  NEUTROABS  --   --   --   --  6.0 4.0 3.4  HGB 13.1   < > 11.6* 10.5* 11.4* 10.4* 10.1*  HCT 41.4   < > 34.0* 31.0* 34.3* 31.4* 31.2*  MCV 75.7*  --   --  72.9* 72.2* 72.5* 73.6*  PLT 264  --   --  316 325 332 309   < > = values in this interval not displayed.   Basic Metabolic Panel: Recent Labs  Lab 07/06/19 1754 07/06/19 1754 07/07/19 0104 07/07/19 0400 07/08/19 0510 07/09/19 1315 07/10/19 0245  NA 141   < > 146* 143 143 141 144  K 4.0   < > 3.2* 3.1* 3.7 3.1* 3.7  CL 105  --   --  108 109 110 111  CO2 18*  --   --  23 22 21* 23  GLUCOSE 242*  --   --  174* 96 233* 189*  BUN 29*  --   --  23 18 18 22   CREATININE 0.62  --   --  0.47* 0.47* 0.34* 0.35*  CALCIUM 9.5  --   --  9.0 9.2 8.7* 8.8*  MG  --   --   --   --  2.0 1.8 1.8  PHOS  --   --   --   --  2.4* 2.0* 2.2*   < > = values in this interval not displayed.   GFR: Estimated Creatinine Clearance: 78.6 mL/min (A) (by C-G formula based on SCr of 0.35 mg/dL (L)). Liver Function Tests: Recent Labs  Lab 07/06/19 1754 07/08/19 0510 07/09/19 1315 07/10/19 0245  AST 44* 19 20 48*  ALT 36 24 25 42  ALKPHOS 86 64 60 60  BILITOT 1.4* 1.4* 0.8 0.8  PROT 8.5* 7.5 6.9 6.7  ALBUMIN 3.5 2.9* 2.6* 2.5*   Recent Labs  Lab 07/06/19 1754  LIPASE 26   No results for input(s): AMMONIA in the last 168 hours. Coagulation Profile: Recent Labs  Lab 07/06/19 1922  INR 1.3*   Cardiac Enzymes: No results for  input(s): CKTOTAL, CKMB, CKMBINDEX, TROPONINI in the last 168 hours. BNP (last 3 results) No results for input(s): PROBNP in the last 8760 hours. HbA1C: No results for input(s): HGBA1C in the last 72 hours. CBG: Recent Labs  Lab 07/09/19 1529 07/09/19 2001 07/09/19 2324 07/10/19 0337 07/10/19 0745  GLUCAP 144* 214* 175* 167* 151*   Lipid Profile: No results for input(s): CHOL, HDL, LDLCALC, TRIG, CHOLHDL, LDLDIRECT in the last 72 hours. Thyroid Function Tests: No results for input(s): TSH, T4TOTAL, FREET4, T3FREE, THYROIDAB in the last 72 hours. Anemia Panel: No results for input(s): VITAMINB12, FOLATE, FERRITIN, TIBC, IRON, RETICCTPCT in the last 72 hours. Sepsis Labs: Recent Labs  Lab 07/06/19 1849 07/06/19 1851 07/07/19 0400  LATICACIDVEN 1.4 1.4 0.9    Recent Results (from the past 240 hour(s))  Urine culture     Status: Abnormal   Collection Time: 07/06/19  6:47 PM   Specimen: In/Out Cath Urine  Result Value Ref Range Status   Specimen Description IN/OUT CATH URINE  Final   Special Requests   Final    NONE Performed at [facility], [address] [place], [place] [address]    Culture (A)  Final    >=100,000 COLONIES/mL KLEBSIELLA PNEUMONIAE Confirmed Extended Spectrum Beta-Lactamase Producer (ESBL).  In bloodstream infections from ESBL organisms, carbapenems are preferred over piperacillin/tazobactam. They are shown to have a lower risk of mortality.    Report Status 07/09/2019 FINAL  Final   Organism ID, Bacteria KLEBSIELLA PNEUMONIAE (A)  Final      Susceptibility   Klebsiella pneumoniae - MIC*    AMPICILLIN >=32 RESISTANT Resistant     CEFAZOLIN >=64 RESISTANT Resistant     CEFTRIAXONE >=64 RESISTANT Resistant     CIPROFLOXACIN >=4 RESISTANT Resistant  GENTAMICIN >=16 RESISTANT Resistant     IMIPENEM <=0.25 SENSITIVE Sensitive     NITROFURANTOIN 64 INTERMEDIATE Intermediate     TRIMETH/SULFA >=320 RESISTANT Resistant     AMPICILLIN/SULBACTAM  >=32 RESISTANT Resistant     PIP/TAZO 16 SENSITIVE Sensitive     * >=100,000 COLONIES/mL KLEBSIELLA PNEUMONIAE  Blood Culture (routine x 2)     Status: Abnormal   Collection Time: 07/06/19  6:53 PM   Specimen: BLOOD  Result Value Ref Range Status   Specimen Description BLOOD SITE NOT SPECIFIED  Final   Special Requests   Final    BOTTLES DRAWN AEROBIC AND ANAEROBIC Blood Culture adequate volume   Culture  Setup Time   Final    GRAM POSITIVE COCCI IN CLUSTERS AEROBIC BOTTLE ONLY CRITICAL RESULT CALLED TO, READ BACK BY AND VERIFIED WITH: ABjorn Loser 16:35 07/07/19 ([email or web address])    Culture (A)  Final    STAPHYLOCOCCUS SPECIES (COAGULASE NEGATIVE) THE SIGNIFICANCE OF ISOLATING THIS ORGANISM FROM A SINGLE SET OF BLOOD CULTURES WHEN MULTIPLE SETS ARE DRAWN IS UNCERTAIN. PLEASE NOTIFY THE MICROBIOLOGY DEPARTMENT WITHIN ONE WEEK IF SPECIATION AND SENSITIVITIES ARE REQUIRED. Performed at [facility], [place] [address], [place], [place] [address]    Report Status 07/08/2019 FINAL  Final  Blood Culture ID Panel (Reflexed)     Status: Abnormal   Collection Time: 07/06/19  6:53 PM  Result Value Ref Range Status   Enterococcus species NOT DETECTED NOT DETECTED Final   Listeria monocytogenes NOT DETECTED NOT DETECTED Final   Staphylococcus species DETECTED (A) NOT DETECTED Final    Comment: Methicillin (oxacillin) resistant coagulase negative staphylococcus. Possible blood culture contaminant (unless isolated from more than one blood culture draw or clinical case suggests pathogenicity). No antibiotic treatment is indicated for blood  culture contaminants. CRITICAL RESULT CALLED TO, READ BACK BY AND VERIFIED WITH: Reece Packer PharmD 16:35 07/07/19 ([email or web address])    Staphylococcus aureus (BCID) NOT DETECTED NOT DETECTED Final   Methicillin resistance DETECTED (A) NOT DETECTED Final    Comment: CRITICAL RESULT CALLED TO, READ BACK BY AND VERIFIED WITH: Reece Packer PharmD 16:35 07/07/19 ([email or web address])    Streptococcus  species NOT DETECTED NOT DETECTED Final   Streptococcus agalactiae NOT DETECTED NOT DETECTED Final   Streptococcus pneumoniae NOT DETECTED NOT DETECTED Final   Streptococcus pyogenes NOT DETECTED NOT DETECTED Final   Acinetobacter baumannii NOT DETECTED NOT DETECTED Final   Enterobacteriaceae species NOT DETECTED NOT DETECTED Final   Enterobacter cloacae complex NOT DETECTED NOT DETECTED Final   Escherichia coli NOT DETECTED NOT DETECTED Final   Klebsiella oxytoca NOT DETECTED NOT DETECTED Final   Klebsiella pneumoniae NOT DETECTED NOT DETECTED Final   Proteus species NOT DETECTED NOT DETECTED Final   Serratia marcescens NOT DETECTED NOT DETECTED Final   Haemophilus influenzae NOT DETECTED NOT DETECTED Final   Neisseria meningitidis NOT DETECTED NOT DETECTED Final   Pseudomonas aeruginosa NOT DETECTED NOT DETECTED Final   Candida albicans NOT DETECTED NOT DETECTED Final   Candida glabrata NOT DETECTED NOT DETECTED Final   Candida krusei NOT DETECTED NOT DETECTED Final   Candida parapsilosis NOT DETECTED NOT DETECTED Final   Candida tropicalis NOT DETECTED NOT DETECTED Final    Comment: Performed at [facility], [place]. [address], [place], [place] [address]  Blood Culture (routine x 2)     Status: None (Preliminary result)   Collection Time: 07/06/19  6:57 PM   Specimen: BLOOD  Result Value Ref Range Status   Specimen  Description BLOOD SITE NOT SPECIFIED  Final   Special Requests   Final    BOTTLES DRAWN AEROBIC AND ANAEROBIC Blood Culture adequate volume   Culture   Final    NO GROWTH 4 DAYS Performed at [facility], [address] [place], [place] [address]    Report Status PENDING  Incomplete  SARS Coronavirus 2 by RT PCR (hospital order, performed in [facility] hospital lab) Nasopharyngeal Nasopharyngeal Swab     Status: None   Collection Time: 07/06/19 10:30 PM   Specimen: Nasopharyngeal Swab  Result Value Ref Range Status   SARS Coronavirus 2 NEGATIVE NEGATIVE  Final    Comment: (NOTE) SARS-CoV-2 target nucleic acids are NOT DETECTED.  The SARS-CoV-2 RNA is generally detectable in upper and lower respiratory specimens during the acute phase of infection. The lowest concentration of SARS-CoV-2 viral copies this assay can detect is 250 copies / mL. A negative result does not preclude SARS-CoV-2 infection and should not be used as the sole basis for treatment or other patient management decisions.  A negative result may occur with improper specimen collection / handling, submission of specimen other than nasopharyngeal swab, presence of viral mutation(s) within the areas targeted by this assay, and inadequate number of viral copies (<250 copies / mL). A negative result must be combined with clinical observations, patient history, and epidemiological information.  Fact Sheet for Patients:   StrictlyIdeas.no  Fact Sheet for Healthcare Providers: BankingDealers.co.za  This test is not yet approved or  cleared by the [place] FDA and has been authorized for detection and/or diagnosis of SARS-CoV-2 by FDA under an Emergency Use Authorization (EUA).  This EUA will remain in effect (meaning this test can be used) for the duration of the COVID-19 declaration under Section 564(b)(1) of the Act, 21 U.S.C. section 360bbb-3(b)(1), unless the authorization is terminated or revoked sooner.  Performed at [facility], [place] [address], [place], [place] [address]   Culture, respiratory (non-expectorated)     Status: None   Collection Time: 07/07/19  4:25 AM   Specimen: Tracheal Aspirate; Respiratory  Result Value Ref Range Status   Specimen Description TRACHEAL ASPIRATE  Final   Special Requests NONE  Final   Gram Stain   Final    FEW WBC PRESENT, PREDOMINANTLY PMN NO ORGANISMS SEEN    Culture   Final    FEW Consistent with normal respiratory flora. Performed at [facility], [place] [address] [place], [place] [address]    Report Status 07/09/2019 FINAL  Final  MRSA PCR Screening     Status: None   Collection Time: 07/07/19  8:49 PM   Specimen: Nasopharyngeal  Result Value Ref Range Status   MRSA by PCR NEGATIVE NEGATIVE Final    Comment:        The GeneXpert MRSA Assay (FDA approved for NASAL specimens only), is one component of a comprehensive MRSA colonization surveillance program. It is not intended to diagnose MRSA infection nor to guide or monitor treatment for MRSA infections. Performed at [facility], [place] [address] [place], [place] [address]          Radiology Studies: No results found.      Scheduled Meds: . amLODipine  10 mg Oral Daily  . chlorhexidine  5 mL Mouth/Throat BID  . clonazePAM  0.5 mg Per Tube Q8H  . cloNIDine  0.1 mg Per Tube Q8H  . docusate  100 mg Per Tube Q12H  .  enoxaparin (LOVENOX) injection  40 mg Subcutaneous QHS  . famotidine  20 mg Per Tube QHS  . feeding supplement (PRO-STAT SUGAR FREE 64)  30 mL Per Tube TID  . fentaNYL  1 patch Transdermal Q72H  . gabapentin  300 mg Per Tube BID  . HYDROcodone-acetaminophen  1 tablet Per Tube BID  . insulin aspart  0-9 Units Subcutaneous Q4H  . insulin aspart  5 Units Subcutaneous TID with meals  . insulin detemir  10 Units Subcutaneous QHS  . multivitamin with minerals  1 tablet Oral Daily  . olopatadine  1 drop Both Eyes BID  . scopolamine  1 patch Transdermal Q72H  . senna  1 tablet Per Tube QHS  . sertraline  100 mg Per Tube QHS  . sodium chloride flush  10-40 mL Intracatheter Q12H   Continuous Infusions: . dextrose 5 % and 0.9% NaCl 75 mL/hr at 07/09/19 1915  . feeding supplement (OSMOLITE 1.5 CAL) 1,000 mL (07/09/19 2114)  . meropenem (MERREM) IV 1 g (07/10/19 0536)     LOS: 3 days    Time spent:40 min    Takeesha Isley, Geraldo Docker, MD [facility] Pager [phone]  If 7PM-7AM, please contact night-coverage [email or web address] Password [facility] 07/10/2019,  12:13 PM

## 2019-07-11 LAB — GLUCOSE, CAPILLARY
Glucose-Capillary: 105 mg/dL — ABNORMAL HIGH (ref 70–99)
Glucose-Capillary: 122 mg/dL — ABNORMAL HIGH (ref 70–99)
Glucose-Capillary: 124 mg/dL — ABNORMAL HIGH (ref 70–99)
Glucose-Capillary: 135 mg/dL — ABNORMAL HIGH (ref 70–99)
Glucose-Capillary: 163 mg/dL — ABNORMAL HIGH (ref 70–99)
Glucose-Capillary: 171 mg/dL — ABNORMAL HIGH (ref 70–99)

## 2019-07-11 LAB — COMPREHENSIVE METABOLIC PANEL
ALT: 106 U/L — ABNORMAL HIGH (ref 0–44)
AST: 85 U/L — ABNORMAL HIGH (ref 15–41)
Albumin: 2.6 g/dL — ABNORMAL LOW (ref 3.5–5.0)
Alkaline Phosphatase: 73 U/L (ref 38–126)
Anion gap: 6 (ref 5–15)
BUN: 20 mg/dL (ref 8–23)
CO2: 26 mmol/L (ref 22–32)
Calcium: 8.7 mg/dL — ABNORMAL LOW (ref 8.9–10.3)
Chloride: 111 mmol/L (ref 98–111)
Creatinine, Ser: 0.3 mg/dL — ABNORMAL LOW (ref 0.61–1.24)
GFR calc Af Amer: >60 mL/min (ref >60)
GFR calc non Af Amer: >60 mL/min (ref >60)
Glucose, Bld: 146 mg/dL — ABNORMAL HIGH (ref 70–99)
Potassium: 3.9 mmol/L (ref 3.5–5.1)
Sodium: 143 mmol/L (ref 135–145)
Total Bilirubin: 0.4 mg/dL (ref 0.3–1.2)
Total Protein: 6.7 g/dL (ref 6.5–8.1)

## 2019-07-11 LAB — CBC WITH DIFFERENTIAL/PLATELET
Abs Immature Granulocytes: 0.03 10*3/uL (ref 0.00–0.07)
Basophils Absolute: 0.1 10*3/uL (ref 0.0–0.1)
Basophils Relative: 1 %
Eosinophils Absolute: 0.4 10*3/uL (ref 0.0–0.5)
Eosinophils Relative: 5 %
HCT: 32.4 % — ABNORMAL LOW (ref 39.0–52.0)
Hemoglobin: 10.6 g/dL — ABNORMAL LOW (ref 13.0–17.0)
Immature Granulocytes: 0 %
Lymphocytes Relative: 25 %
Lymphs Abs: 2.1 10*3/uL (ref 0.7–4.0)
MCH: 24 pg — ABNORMAL LOW (ref 26.0–34.0)
MCHC: 32.7 g/dL (ref 30.0–36.0)
MCV: 73.3 fL — ABNORMAL LOW (ref 80.0–100.0)
Monocytes Absolute: 0.9 10*3/uL (ref 0.1–1.0)
Monocytes Relative: 10 %
Neutro Abs: 4.9 10*3/uL (ref 1.7–7.7)
Neutrophils Relative %: 59 %
Platelets: 329 10*3/uL (ref 150–400)
RBC: 4.42 MIL/uL (ref 4.22–5.81)
RDW: 16.1 % — ABNORMAL HIGH (ref 11.5–15.5)
WBC: 8.4 10*3/uL (ref 4.0–10.5)
nRBC: 0 % (ref 0.0–0.2)

## 2019-07-11 LAB — PHOSPHORUS: Phosphorus: 1.9 mg/dL — ABNORMAL LOW (ref 2.5–4.6)

## 2019-07-11 LAB — SARS CORONAVIRUS 2 (TAT 6-24 HRS): SARS Coronavirus 2: NEGATIVE

## 2019-07-11 LAB — CULTURE, BLOOD (ROUTINE X 2)
Culture: NO GROWTH
Special Requests: ADEQUATE

## 2019-07-11 LAB — MAGNESIUM: Magnesium: 1.8 mg/dL (ref 1.7–2.4)

## 2019-07-11 MED ORDER — POTASSIUM PHOSPHATES 15 MMOLE/5ML IV SOLN
20.0000 mmol | Freq: Once | INTRAVENOUS | Status: AC
  Administered 2019-07-11: 20 mmol via INTRAVENOUS
  Filled 2019-07-11: qty 6.67

## 2019-07-11 NOTE — Progress Notes (Signed)
At shift change Pt HR fluctuated between 70-150s. EKG performed and placed in chart. On call provider notified and no new orders received.   Sharin Mons, RN

## 2019-07-11 NOTE — Progress Notes (Signed)
NAME:  DECODA VAN, MRN:  [id number], DOB:  21-May-1950, LOS: 4 ADMISSION DATE:  07/06/2019, CONSULTATION DATE:  6/24 REFERRING MD:  Dr. Alejandro Mulling, CHIEF COMPLAINT: Abdominal pain  Brief History   69 year old male trach/vent dependent from [facility] with abdominal pain. Found to have fecal impaction, UTI, pneumonia. Admitted to hospitalists service. PCCM consult for vent.   History of present illness   Patient is encephalopathic and/or intubated. Therefore history has been obtained from chart review.   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 He has been vent dependent since 01/2017 and resides at [facility]. Recent course has been complicated by abdominal pain and distention. He has been diagnosed with ileus and has been an ongoing issue for what sounds like more than one week. On 6/23 abdominal distension became worse.  Xray of the abdomen was performed and indicated worsening ileus. He was noted to have altered mental status at the SNF and was transferred to [facility] ED 6/23 in this context. Workup in the ED was significant for CT abdomen demonstrating lower lobe consolidation, fecal impaction, and stercoral colitis. Laboratory evaluation significant for WBC 16.6, CO2 18, and Glucose 242. UA concerning for infection. He was treated with empiric antibiotics and was admitted to the hospitalists service for sepsis, UTI, Pneumonia,  And fecal impaction. PCCM was asked to see for ventilator management.   Past Medical History   has a past medical history of Anemia, Anxiety, Chronic respiratory failure ([place]), DM2 (diabetes mellitus, type 2) ([place]), Functional quadriplegia ([place]), Hypertension, and Urine retention.  Significant Hospital Events   6/23 admit  Consults:  PCCM  Procedures:  Trach pre hospital #6  Significant Diagnostic Tests:  CT abdomen 6/24 > Fecal impaction possible stercoral colitis. No  evidence of bowel obstruction. Scattered air in fluid throughout small bowel that is nondilated may represent subsequent ileus. Distended urinary bladder. Progressive dependent bilateral lower lobe airspace consolidations with air bronchograms, suspicious for pneumonia/aspiration  Micro Data:  Blood 6/23 > Urine 6/23 >  Blood 6/23 >  Antimicrobials:   Zosyn 6/23 Vancomycin 6/23 -6/25 Meropenem 6/23 >  Interim history/subjective:  Stable, no overnight events  Objective   Blood pressure 135/90, pulse 85, temperature 98.4 F (36.9 C), resp. rate 18, height 5\' 6"  (1.676 m), weight 72.7 kg, SpO2 100 %.    Vent Mode: PRVC FiO2 (%):  [30 %] 30 % Set Rate:  [16 bmp] 16 bmp Vt Set:  [500 mL] 500 mL PEEP:  [5 cmH20] 5 cmH20 Plateau Pressure:  [15 cmH20-19 cmH20] 16 cmH20   Intake/Output Summary (Last 24 hours) at 07/11/2019 1043 Last data filed at 07/11/2019 2706 Gross per 24 hour  Intake 240 ml  Output 2425 ml  Net -2185 ml   Filed Weights   07/10/19 0500 07/11/19 0100 07/11/19 0103  Weight: 76.1 kg 72.7 kg 72.7 kg    Examination:  General: 69 year old male who has quadriparesis on full mechanical ventilatory support HEENT: Tracheostomy in place, on full vent support Neuro: Able to nod and laugh appropriately CV: S1-S2 appreciated PULM: Diminished breath sounds throughout Pressure regulated volume control  GI: soft, bsx4 active PEG is in place: Extremities: warm/dry, 1+ edema  Skin: no rashes or lesions   Resolved Hospital Problem list   NA  Assessment & Plan:  This is a 69 yo with history as noted above who presents for concerns for sepsis. PCCM consulted for vent management.  Chronic respiratory failure-2/2 to compressive myelopathy has been vent dependant since 2018. Is on assist control Fio2 28% TV 500, peep 5 and Rate of 16 at facility.  Continue current vent settings Serial chest x-ray Sputum cultures  Sepsis- Broad-spectrum antibiotics Aggressive  pulmonary toilet Follow culture data  UTI positive for Klebsiella pneumonia -Plan for 5 days of meropenem   Hypertension Type 2 diabetes Sacral decubitus ulcer  Sherrilyn Rist, MD [place] PCCM Pager: [phone]

## 2019-07-11 NOTE — Progress Notes (Signed)
PROGRESS NOTE    Luis Orr  YTK:[id number] DOB: 05/17/1950 DOA: 07/06/2019 PCP: Townsend Roger, MD     Brief Narrative:  69 y.o. BM PMHx   anxiety, diabetes type 2 controlled with complication, HTN, urine retention, chronic respiratory failure on vent.  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  Pt is Vent dependent as a result since end of 2018.  Pt has been residing in [facility] on chronic vent since Jan 2019.  Despite the quadriplegia and respiratory failure, pt is apparently able to urinate without the use of a catheter.  Pt sent in to the ED today with c/o abd pain.  Concern for worsening abd distention and "ileus".  Patient seems AAOx3, but history taking is difficult secondary to pt being on chronic mechanical ventilator and unable to make audible words (have to lip read patient).  Thus most of the history is confirmed from his wife.   Subjective: 6/28 afebrile last 24 hours nods yes and no to questions.   Assessment & Plan: Covid vaccination; positive vaccination   Principal Problem:   Sepsis secondary to UTI [facility]) Active Problems:   Functional quadriplegia (HCC)   Chronic respiratory failure requiring continuous mechanical ventilation through tracheostomy ([place])   Fecal impaction (HCC)   HTN (hypertension)   DM2 (diabetes mellitus, type 2) ([place])  positive coag negative staph 1/2 most likely contaminant -Positive methicillin-resistant Staphylococcus awaiting speciation -DC vancomycin trach aspirate WNL and blood culture most likely contaminant  Sepsis UTI/positive Klebsiella pneumoniae/positive ESBL -Continue meropenem x5 days  -D5-0.9% saline 50ml/hr -6/27 patient should be stable for discharge on 6/29 at completion of antibiotics  Chronic ventilator/VAP? -Per PCCM -Trach aspirate being reintubated -6/26 tracheostomy secretions do not appear to be infective.  Scopolamine patch, monitor carefully to ensure secretions do not become  too thick to suction.  Fecal impaction  -Per patient resolved -Continue tube feeds  Essential HTN -Amlodipine 10 mg daily -Clonidine 0.1 mg TID -6/27 lisinopril 5 mg daily  Diabetes type 2 hold with complication -7/51 hemoglobin A1c 7.0 -Levemir 10 units daily -6/26 NovoLog 5 units TID -Sensitive SSI  Hypokalemia -resolved  Hypophosphatemia -K-Phos 20 mmol  Stage III sacral decubitus ulcer Pressure Injury 07/07/19 Sacrum Mid Stage 3 -  Full thickness tissue loss. Subcutaneous fat may be visible but bone, tendon or muscle are NOT exposed. (Active)  07/07/19 2030  Location: Sacrum  Location Orientation: Mid  Staging: Stage 3 -  Full thickness tissue loss. Subcutaneous fat may be visible but bone, tendon or muscle are NOT exposed.  Wound Description (Comments):   Present on Admission: Yes        DVT prophylaxis: Lovenox Code Status: Full Family Communication:  Status is: Inpatient    Dispo: The patient is from: [facility]              Anticipated d/c is to: [facility]              Anticipated d/c date is: 6/29              Patient currently unstable staph bacteremia; awaiting Covid test as required by [facility]      Consultants:  PCCM   Procedures/Significant Events:     I have personally reviewed and interpreted all radiology studies and my findings are as above.  VENTILATOR SETTINGS: Mode; PRVC 6/27 Vt; 513ml Set rate; 16 FiO2; 30% PEEP; 5 SPO2; 100%   Cultures 6/23 blood positive coag negative staph 1/2 most likely contaminant 6/23 blood NGTD  6/23 urine positive Klebsiella pneumoniae/positive ESBL 6/24 tracheal aspirate consistent with normal respiratory flora    Antimicrobials: Anti-infectives (From admission, onward)   Start     Ordered Stop   07/07/19 1300  vancomycin (VANCOCIN) IVPB 1000 mg/200 mL premix  Status:  Discontinued        07/07/19 0048 07/09/19 0918   07/07/19 0200  meropenem (MERREM) 1 g in sodium chloride 0.9 % 100 mL IVPB      Discontinue     07/07/19 0048     07/07/19 0100  piperacillin-tazobactam (ZOSYN) IVPB 3.375 g  Status:  Discontinued        07/06/19 2304 07/07/19 0041   07/07/19 0100  vancomycin (VANCOREADY) IVPB 1500 mg/300 mL        07/07/19 0048 07/07/19 0343   07/06/19 1900  piperacillin-tazobactam (ZOSYN) IVPB 3.375 g        07/06/19 1849 07/06/19 2000       Devices    LINES / TUBES:      Continuous Infusions:  dextrose 5 % and 0.9% NaCl 75 mL/hr at 07/09/19 1915   feeding supplement (OSMOLITE 1.5 CAL) 1,000 mL (07/09/19 2114)   meropenem (MERREM) IV 1 g (07/11/19 0617)     Objective: Vitals:   07/11/19 0900 07/11/19 1152 07/11/19 1156 07/11/19 1549  BP:  126/65    Pulse: 85 79  77  Resp: 18 17  16   Temp:  98.3 F (36.8 C)    TempSrc:      SpO2: 100% 95% 94%   Weight:      Height:        Intake/Output Summary (Last 24 hours) at 07/11/2019 1559 Last data filed at 07/11/2019 0635 Gross per 24 hour  Intake --  Output 1950 ml  Net -1950 ml   Filed Weights   07/10/19 0500 07/11/19 0100 07/11/19 0103  Weight: 76.1 kg 72.7 kg 72.7 kg   Physical Exam:  General: Alert, follows all commands, nods yes and no to questions as well as [employer].  Positive chronic respiratory distress Eyes: negative scleral hemorrhage, negative anisocoria, negative icterus ENT: Negative Runny nose, negative gingival bleeding, Neck:  Negative scars, masses, torticollis, lymphadenopathy, JVD #6 cuffed Shiley in place covered and clean negative sign of infection Lungs: Clear to auscultation bilaterally without wheezes or crackles Cardiovascular: Regular rate and rhythm without murmur gallop or rub normal S1 and S2 Abdomen: negative abdominal pain, nondistended, positive soft, bowel sounds, no rebound, no ascites, no appreciable mass Extremities: No significant cyanosis, clubbing, or edema bilateral lower extremities Skin: Negative rashes, lesions, ulcers Psychiatric:  Negative depression,  negative anxiety, negative fatigue, negative mania  Central nervous system: Patient nod yes and no to questions otherwise unable to move extremities.   .     Data Reviewed: Care during the described time interval was provided by me .  I have reviewed this patient's available data, including medical history, events of note, physical examination, and all test results as part of my evaluation.  CBC: Recent Labs  Lab 07/07/19 0400 07/08/19 0510 07/09/19 1315 07/10/19 0245 07/11/19 0843  WBC 14.6* 8.9 7.0 6.1 8.4  NEUTROABS  --  6.0 4.0 3.4 4.9  HGB 10.5* 11.4* 10.4* 10.1* 10.6*  HCT 31.0* 34.3* 31.4* 31.2* 32.4*  MCV 72.9* 72.2* 72.5* 73.6* 73.3*  PLT 316 325 332 309 563   Basic Metabolic Panel: Recent Labs  Lab 07/07/19 0400 07/08/19 0510 07/09/19 1315 07/10/19 0245 07/11/19 0843  NA 143 143 141 144  143  K 3.1* 3.7 3.1* 3.7 3.9  CL 108 109 110 111 111  CO2 23 22 21* 23 26  GLUCOSE 174* 96 233* 189* 146*  BUN 23 18 18 22 20   CREATININE 0.47* 0.47* 0.34* 0.35* 0.30*  CALCIUM 9.0 9.2 8.7* 8.8* 8.7*  MG  --  2.0 1.8 1.8 1.8  PHOS  --  2.4* 2.0* 2.2* 1.9*   GFR: Estimated Creatinine Clearance: 78.6 mL/min (A) (by C-G formula based on SCr of 0.3 mg/dL (L)). Liver Function Tests: Recent Labs  Lab 07/06/19 1754 07/08/19 0510 07/09/19 1315 07/10/19 0245 07/11/19 0843  AST 44* 19 20 48* 85*  ALT 36 24 25 42 106*  ALKPHOS 86 64 60 60 73  BILITOT 1.4* 1.4* 0.8 0.8 0.4  PROT 8.5* 7.5 6.9 6.7 6.7  ALBUMIN 3.5 2.9* 2.6* 2.5* 2.6*   Recent Labs  Lab 07/06/19 1754  LIPASE 26   No results for input(s): AMMONIA in the last 168 hours. Coagulation Profile: Recent Labs  Lab 07/06/19 1922  INR 1.3*   Cardiac Enzymes: No results for input(s): CKTOTAL, CKMB, CKMBINDEX, TROPONINI in the last 168 hours. BNP (last 3 results) No results for input(s): PROBNP in the last 8760 hours. HbA1C: No results for input(s): HGBA1C in the last 72 hours. CBG: Recent Labs  Lab  07/10/19 2309 07/11/19 0405 07/11/19 0731 07/11/19 1148 07/11/19 1535  GLUCAP 145* 163* 135* 105* 122*   Lipid Profile: No results for input(s): CHOL, HDL, LDLCALC, TRIG, CHOLHDL, LDLDIRECT in the last 72 hours. Thyroid Function Tests: No results for input(s): TSH, T4TOTAL, FREET4, T3FREE, THYROIDAB in the last 72 hours. Anemia Panel: No results for input(s): VITAMINB12, FOLATE, FERRITIN, TIBC, IRON, RETICCTPCT in the last 72 hours. Sepsis Labs: Recent Labs  Lab 07/06/19 1849 07/06/19 1851 07/07/19 0400  LATICACIDVEN 1.4 1.4 0.9    Recent Results (from the past 240 hour(s))  Urine culture     Status: Abnormal   Collection Time: 07/06/19  6:47 PM   Specimen: In/Out Cath Urine  Result Value Ref Range Status   Specimen Description IN/OUT CATH URINE  Final   Special Requests   Final    NONE Performed at [facility], [address], [place], [place] [address]    Culture (A)  Final    >=100,000 COLONIES/mL KLEBSIELLA PNEUMONIAE Confirmed Extended Spectrum Beta-Lactamase Producer (ESBL).  In bloodstream infections from ESBL organisms, carbapenems are preferred over piperacillin/tazobactam. They are shown to have a lower risk of mortality.    Report Status 07/09/2019 FINAL  Final   Organism ID, Bacteria KLEBSIELLA PNEUMONIAE (A)  Final      Susceptibility   Klebsiella pneumoniae - MIC*    AMPICILLIN >=32 RESISTANT Resistant     CEFAZOLIN >=64 RESISTANT Resistant     CEFTRIAXONE >=64 RESISTANT Resistant     CIPROFLOXACIN >=4 RESISTANT Resistant     GENTAMICIN >=16 RESISTANT Resistant     IMIPENEM <=0.25 SENSITIVE Sensitive     NITROFURANTOIN 64 INTERMEDIATE Intermediate     TRIMETH/SULFA >=320 RESISTANT Resistant     AMPICILLIN/SULBACTAM >=32 RESISTANT Resistant     PIP/TAZO 16 SENSITIVE Sensitive     * >=100,000 COLONIES/mL KLEBSIELLA PNEUMONIAE  Blood Culture (routine x 2)     Status: Abnormal   Collection Time: 07/06/19  6:53 PM   Specimen: BLOOD  Result Value  Ref Range Status   Specimen Description BLOOD SITE NOT SPECIFIED  Final   Special Requests   Final    BOTTLES  DRAWN AEROBIC AND ANAEROBIC Blood Culture adequate volume   Culture  Setup Time   Final    GRAM POSITIVE COCCI IN CLUSTERS AEROBIC BOTTLE ONLY CRITICAL RESULT CALLED TO, READ BACK BY AND VERIFIED WITH: ABjorn Loser 16:35 07/07/19 ([email or web address])    Culture (A)  Final    STAPHYLOCOCCUS SPECIES (COAGULASE NEGATIVE) THE SIGNIFICANCE OF ISOLATING THIS ORGANISM FROM A SINGLE SET OF BLOOD CULTURES WHEN MULTIPLE SETS ARE DRAWN IS UNCERTAIN. PLEASE NOTIFY THE MICROBIOLOGY DEPARTMENT WITHIN ONE WEEK IF SPECIATION AND SENSITIVITIES ARE REQUIRED. Performed at [facility], [place] [address], [place], [place] [address]    Report Status 07/08/2019 FINAL  Final  Blood Culture ID Panel (Reflexed)     Status: Abnormal   Collection Time: 07/06/19  6:53 PM  Result Value Ref Range Status   Enterococcus species NOT DETECTED NOT DETECTED Final   Listeria monocytogenes NOT DETECTED NOT DETECTED Final   Staphylococcus species DETECTED (A) NOT DETECTED Final    Comment: Methicillin (oxacillin) resistant coagulase negative staphylococcus. Possible blood culture contaminant (unless isolated from more than one blood culture draw or clinical case suggests pathogenicity). No antibiotic treatment is indicated for blood  culture contaminants. CRITICAL RESULT CALLED TO, READ BACK BY AND VERIFIED WITH: Reece Packer PharmD 16:35 07/07/19 ([email or web address])    Staphylococcus aureus (BCID) NOT DETECTED NOT DETECTED Final   Methicillin resistance DETECTED (A) NOT DETECTED Final    Comment: CRITICAL RESULT CALLED TO, READ BACK BY AND VERIFIED WITH: Reece Packer PharmD 16:35 07/07/19 ([email or web address])    Streptococcus species NOT DETECTED NOT DETECTED Final   Streptococcus agalactiae NOT DETECTED NOT DETECTED Final   Streptococcus pneumoniae NOT DETECTED NOT DETECTED Final   Streptococcus pyogenes NOT DETECTED NOT DETECTED Final   Acinetobacter  baumannii NOT DETECTED NOT DETECTED Final   Enterobacteriaceae species NOT DETECTED NOT DETECTED Final   Enterobacter cloacae complex NOT DETECTED NOT DETECTED Final   Escherichia coli NOT DETECTED NOT DETECTED Final   Klebsiella oxytoca NOT DETECTED NOT DETECTED Final   Klebsiella pneumoniae NOT DETECTED NOT DETECTED Final   Proteus species NOT DETECTED NOT DETECTED Final   Serratia marcescens NOT DETECTED NOT DETECTED Final   Haemophilus influenzae NOT DETECTED NOT DETECTED Final   Neisseria meningitidis NOT DETECTED NOT DETECTED Final   Pseudomonas aeruginosa NOT DETECTED NOT DETECTED Final   Candida albicans NOT DETECTED NOT DETECTED Final   Candida glabrata NOT DETECTED NOT DETECTED Final   Candida krusei NOT DETECTED NOT DETECTED Final   Candida parapsilosis NOT DETECTED NOT DETECTED Final   Candida tropicalis NOT DETECTED NOT DETECTED Final    Comment: Performed at [facility], [place]. [address] [place], [place] [address]  Blood Culture (routine x 2)     Status: None   Collection Time: 07/06/19  6:57 PM   Specimen: BLOOD  Result Value Ref Range Status   Specimen Description BLOOD SITE NOT SPECIFIED  Final   Special Requests   Final    BOTTLES DRAWN AEROBIC AND ANAEROBIC Blood Culture adequate volume   Culture   Final    NO GROWTH 5 DAYS Performed at [place] [address] [place], [place] [address]    Report Status 07/11/2019 FINAL  Final  SARS Coronavirus 2 by RT PCR (hospital order, performed in [facility] hospital lab) Nasopharyngeal Nasopharyngeal Swab     Status: None   Collection Time: 07/06/19 10:30 PM   Specimen: Nasopharyngeal Swab  Result Value Ref Range Status   SARS Coronavirus  2 NEGATIVE NEGATIVE Final    Comment: (NOTE) SARS-CoV-2 target nucleic acids are NOT DETECTED.  The SARS-CoV-2 RNA is generally detectable in upper and lower respiratory specimens during the acute phase of infection. The lowest concentration of SARS-CoV-2 viral  copies this assay can detect is 250 copies / mL. A negative result does not preclude SARS-CoV-2 infection and should not be used as the sole basis for treatment or other patient management decisions.  A negative result may occur with improper specimen collection / handling, submission of specimen other than nasopharyngeal swab, presence of viral mutation(s) within the areas targeted by this assay, and inadequate number of viral copies (<250 copies / mL). A negative result must be combined with clinical observations, patient history, and epidemiological information.  Fact Sheet for Patients:   StrictlyIdeas.no  Fact Sheet for Healthcare Providers: BankingDealers.co.za  This test is not yet approved or  cleared by the [place] FDA and has been authorized for detection and/or diagnosis of SARS-CoV-2 by FDA under an Emergency Use Authorization (EUA).  This EUA will remain in effect (meaning this test can be used) for the duration of the COVID-19 declaration under Section 564(b)(1) of the Act, 21 U.S.C. section 360bbb-3(b)(1), unless the authorization is terminated or revoked sooner.  Performed at [facility], [place] [address] [place], [place] [address]   Culture, respiratory (non-expectorated)     Status: None   Collection Time: 07/07/19  4:25 AM   Specimen: Tracheal Aspirate; Respiratory  Result Value Ref Range Status   Specimen Description TRACHEAL ASPIRATE  Final   Special Requests NONE  Final   Gram Stain   Final    FEW WBC PRESENT, PREDOMINANTLY PMN NO ORGANISMS SEEN    Culture   Final    FEW Consistent with normal respiratory flora. Performed at [facility], [place] [address] [place], [place] [address]    Report Status 07/09/2019 FINAL  Final  MRSA PCR Screening     Status: None   Collection Time: 07/07/19  8:49 PM   Specimen: Nasopharyngeal  Result Value Ref Range Status   MRSA by PCR NEGATIVE NEGATIVE  Final    Comment:        The GeneXpert MRSA Assay (FDA approved for NASAL specimens only), is one component of a comprehensive MRSA colonization surveillance program. It is not intended to diagnose MRSA infection nor to guide or monitor treatment for MRSA infections. Performed at [facility], [place] [address], [place], [place] [address]   SARS CORONAVIRUS 2 (TAT 6-24 HRS) Nasopharyngeal Nasopharyngeal Swab     Status: None   Collection Time: 07/11/19 11:15 AM   Specimen: Nasopharyngeal Swab  Result Value Ref Range Status   SARS Coronavirus 2 NEGATIVE NEGATIVE Final    Comment: (NOTE) SARS-CoV-2 target nucleic acids are NOT DETECTED.  The SARS-CoV-2 RNA is generally detectable in upper and lower respiratory specimens during the acute phase of infection. Negative results do not preclude SARS-CoV-2 infection, do not rule out co-infections with other pathogens, and should not be used as the sole basis for treatment or other patient management decisions. Negative results must be combined with clinical observations, patient history, and epidemiological information. The expected result is Negative.  Fact Sheet for Patients: SugarRoll.be  Fact Sheet for Healthcare Providers: https://www.[employer]-mathews.com/  This test is not yet approved or cleared by the [place] FDA and  has been authorized for detection and/or diagnosis of SARS-CoV-2 by FDA under an Emergency Use Authorization (EUA). This  EUA will remain  in effect (meaning this test can be used) for the duration of the COVID-19 declaration under Se ction 564(b)(1) of the Act, 21 U.S.C. section 360bbb-3(b)(1), unless the authorization is terminated or revoked sooner.  Performed at [facility], [place] [address], [place], [place] [address]          Radiology Studies: No results found.      Scheduled Meds:  amLODipine  10 mg Oral Daily   chlorhexidine  5 mL  Mouth/Throat BID   clonazePAM  0.5 mg Per Tube Q8H   cloNIDine  0.1 mg Per Tube Q8H   docusate  100 mg Per Tube Q12H   enoxaparin (LOVENOX) injection  40 mg Subcutaneous QHS   famotidine  20 mg Per Tube QHS   feeding supplement (PRO-STAT SUGAR FREE 64)  30 mL Per Tube TID   fentaNYL  1 patch Transdermal Q72H   gabapentin  300 mg Per Tube BID   HYDROcodone-acetaminophen  1 tablet Per Tube BID   insulin aspart  0-9 Units Subcutaneous Q4H   insulin aspart  5 Units Subcutaneous TID with meals   insulin detemir  10 Units Subcutaneous QHS   lisinopril  5 mg Oral Daily   multivitamin with minerals  1 tablet Oral Daily   olopatadine  1 drop Both Eyes BID   scopolamine  1 patch Transdermal Q72H   senna  1 tablet Per Tube QHS   sertraline  100 mg Per Tube QHS   sodium chloride flush  10-40 mL Intracatheter Q12H   Continuous Infusions:  dextrose 5 % and 0.9% NaCl 75 mL/hr at 07/09/19 1915   feeding supplement (OSMOLITE 1.5 CAL) 1,000 mL (07/09/19 2114)   meropenem (MERREM) IV 1 g (07/11/19 0617)     LOS: 4 days    Time spent:40 min    Allysa Governale, Geraldo Docker, MD [facility] Pager [phone]  If 7PM-7AM, please contact night-coverage [email or web address] Password [facility] 07/11/2019, 3:59 PM

## 2019-07-11 NOTE — Progress Notes (Addendum)
Pharmacy Antibiotic Note  Luis Orr is a 69 y.o. male admitted on 07/06/2019 with Intra-abdominal infection.  CCM recommends changing Zosyn to [place] (as pt was on Merrem recently at [facility]).   WBC is down to normal limits, Afebrile.   Plan: Meropenem 1gm IV q8h through 6/29, stop date in place. Pharmacy will sign off consult.   Height: 5\' 6"  (167.6 cm) Weight: 72.7 kg (160 lb 4.4 oz) IBW/kg (Calculated) : 63.8  Temp (24hrs), Avg:98.2 F (36.8 C), Min:97.9 F (36.6 C), Max:98.5 F (36.9 C)  Recent Labs  Lab 07/06/19 1754 07/06/19 1849 07/06/19 1851 07/07/19 0400 07/08/19 0510 07/09/19 1315 07/10/19 0245 07/11/19 0843  WBC   < >  --   --  14.6* 8.9 7.0 6.1 8.4  CREATININE   < >  --   --  0.47* 0.47* 0.34* 0.35* 0.30*  LATICACIDVEN  --  1.4 1.4 0.9  --   --   --   --    < > = values in this interval not displayed.    Estimated Creatinine Clearance: 78.6 mL/min (A) (by C-G formula based on SCr of 0.3 mg/dL (L)).    No Known Allergies  Antimicrobials this admission: Zosyn 6/23 x1 Meropenem 6/24>> (6/29) Vanc 6/24>> 6/26   Microbiology results: 6/23 COVID negative 6/23 BCx: 1/4 GPC (Coag neg staph; likely contaminant) 6/23 UCx:  >100K ESBL kleb pneumo (s-imi) 6/24 MRSA PCR negative 6/24 Trach aspirate: nrf   Thank you for allowing pharmacy to be a part of this patient's care.  Sloan Leiter, PharmD, BCPS, BCCCP Clinical Pharmacist Please refer to [facility] for [place] numbers 07/11/2019 11:11 AM

## 2019-07-12 DIAGNOSIS — Z1612 Extended spectrum beta lactamase (ESBL) resistance: Secondary | ICD-10-CM

## 2019-07-12 DIAGNOSIS — A499 Bacterial infection, unspecified: Secondary | ICD-10-CM

## 2019-07-12 DIAGNOSIS — A498 Other bacterial infections of unspecified site: Secondary | ICD-10-CM

## 2019-07-12 LAB — GLUCOSE, CAPILLARY
Glucose-Capillary: 170 mg/dL — ABNORMAL HIGH (ref 70–99)
Glucose-Capillary: 132 mg/dL — ABNORMAL HIGH (ref 70–99)
Glucose-Capillary: 101 mg/dL — ABNORMAL HIGH (ref 70–99)

## 2019-07-12 LAB — CBC WITH DIFFERENTIAL/PLATELET
Abs Immature Granulocytes: 0.05 10*3/uL (ref 0.00–0.07)
Basophils Absolute: 0.1 10*3/uL (ref 0.0–0.1)
Basophils Relative: 1 %
Eosinophils Absolute: 0.4 10*3/uL (ref 0.0–0.5)
Eosinophils Relative: 4 %
HCT: 33.7 % — ABNORMAL LOW (ref 39.0–52.0)
Hemoglobin: 10.9 g/dL — ABNORMAL LOW (ref 13.0–17.0)
Immature Granulocytes: 1 %
Lymphocytes Relative: 23 %
Lymphs Abs: 2.5 10*3/uL (ref 0.7–4.0)
MCH: 23.9 pg — ABNORMAL LOW (ref 26.0–34.0)
MCHC: 32.3 g/dL (ref 30.0–36.0)
MCV: 73.9 fL — ABNORMAL LOW (ref 80.0–100.0)
Monocytes Absolute: 0.9 10*3/uL (ref 0.1–1.0)
Monocytes Relative: 9 %
Neutro Abs: 6.8 10*3/uL (ref 1.7–7.7)
Neutrophils Relative %: 62 %
Platelets: 316 10*3/uL (ref 150–400)
RBC: 4.56 MIL/uL (ref 4.22–5.81)
RDW: 16.5 % — ABNORMAL HIGH (ref 11.5–15.5)
WBC: 10.8 10*3/uL — ABNORMAL HIGH (ref 4.0–10.5)
nRBC: 0 % (ref 0.0–0.2)

## 2019-07-12 LAB — COMPREHENSIVE METABOLIC PANEL
ALT: 74 U/L — ABNORMAL HIGH (ref 0–44)
AST: 40 U/L (ref 15–41)
Albumin: 2.6 g/dL — ABNORMAL LOW (ref 3.5–5.0)
Alkaline Phosphatase: 69 U/L (ref 38–126)
Anion gap: 9 (ref 5–15)
BUN: 20 mg/dL (ref 8–23)
CO2: 24 mmol/L (ref 22–32)
Calcium: 8.7 mg/dL — ABNORMAL LOW (ref 8.9–10.3)
Chloride: 108 mmol/L (ref 98–111)
Creatinine, Ser: 0.38 mg/dL — ABNORMAL LOW (ref 0.61–1.24)
GFR calc Af Amer: >60 mL/min (ref >60)
GFR calc non Af Amer: >60 mL/min (ref >60)
Glucose, Bld: 142 mg/dL — ABNORMAL HIGH (ref 70–99)
Potassium: 4.1 mmol/L (ref 3.5–5.1)
Sodium: 141 mmol/L (ref 135–145)
Total Bilirubin: 0.4 mg/dL (ref 0.3–1.2)
Total Protein: 7 g/dL (ref 6.5–8.1)

## 2019-07-12 LAB — MAGNESIUM: Magnesium: 1.9 mg/dL (ref 1.7–2.4)

## 2019-07-12 LAB — PHOSPHORUS: Phosphorus: 2.3 mg/dL — ABNORMAL LOW (ref 2.5–4.6)

## 2019-07-12 MED ORDER — ACETAMINOPHEN 650 MG RE SUPP: 650.0000 mg | Freq: Four times a day (QID) | RECTAL | 0 refills | Status: DC | PRN

## 2019-07-12 MED ORDER — HYDROCODONE-ACETAMINOPHEN 7.5-325 MG PO TABS: 1.0000 | ORAL_TABLET | Freq: Two times a day (BID) | ORAL | 0 refills | Status: DC

## 2019-07-12 MED ORDER — CLONAZEPAM 0.5 MG PO TABS: 0.5000 mg | ORAL_TABLET | Freq: Three times a day (TID) | ORAL | 0 refills | Status: AC

## 2019-07-12 MED ORDER — FENTANYL 25 MCG/HR TD PT72: 1.0000 | MEDICATED_PATCH | TRANSDERMAL | 0 refills | Status: AC

## 2019-07-12 MED ORDER — ONDANSETRON HCL 4 MG PO TABS: 4.0000 mg | ORAL_TABLET | Freq: Four times a day (QID) | ORAL | 0 refills | Status: DC | PRN

## 2019-07-12 MED ORDER — FAMOTIDINE 20 MG PO TABS: 20.0000 mg | ORAL_TABLET | Freq: Every day | ORAL | 0 refills | Status: AC

## 2019-07-12 MED ORDER — SERTRALINE HCL 100 MG PO TABS: 100.0000 mg | ORAL_TABLET | Freq: Every day | ORAL | 0 refills | Status: AC

## 2019-07-12 MED ORDER — GABAPENTIN 250 MG/5ML PO SOLN: 300.0000 mg | Freq: Two times a day (BID) | ORAL | 0 refills | Status: AC

## 2019-07-12 MED ORDER — CLONIDINE HCL 0.1 MG PO TABS: 0.1000 mg | ORAL_TABLET | Freq: Three times a day (TID) | ORAL | 0 refills | Status: DC

## 2019-07-12 MED ORDER — DOCUSATE SODIUM 50 MG/5ML PO LIQD: 100.0000 mg | Freq: Two times a day (BID) | ORAL | 0 refills | Status: AC

## 2019-07-12 MED ORDER — SCOPOLAMINE 1 MG/3DAYS TD PT72: 1.0000 | MEDICATED_PATCH | TRANSDERMAL | 0 refills | Status: DC

## 2019-07-12 MED ORDER — ACETAMINOPHEN 650 MG RE SUPP: 650.0000 mg | Freq: Four times a day (QID) | RECTAL | Status: DC | PRN

## 2019-07-12 MED ORDER — ACETAMINOPHEN 160 MG/5ML PO SOLN: 650.0000 mg | Freq: Four times a day (QID) | ORAL | 0 refills | Status: DC | PRN

## 2019-07-12 MED ORDER — LISINOPRIL 5 MG PO TABS: 5.0000 mg | ORAL_TABLET | Freq: Every day | ORAL | 0 refills | Status: AC

## 2019-07-12 MED ORDER — PRO-STAT SUGAR FREE PO LIQD: 30.0000 mL | Freq: Three times a day (TID) | ORAL | 0 refills | Status: DC

## 2019-07-12 MED ORDER — ACETAMINOPHEN 160 MG/5ML PO SOLN
650.0000 mg | Freq: Four times a day (QID) | ORAL | Status: DC | PRN
  Administered 2019-07-12: 650 mg
  Filled 2019-07-12 (×2): qty 20.3

## 2019-07-12 MED ORDER — SIMETHICONE 80 MG PO CHEW: 80.0000 mg | CHEWABLE_TABLET | Freq: Three times a day (TID) | ORAL | 0 refills | Status: DC | PRN

## 2019-07-12 MED ORDER — POLYETHYLENE GLYCOL 3350 17 G PO PACK: 17.0000 g | PACK | Freq: Every day | ORAL | 0 refills | Status: DC | PRN

## 2019-07-12 MED ORDER — OSMOLITE 1.5 CAL PO LIQD: 1000.0000 mL | ORAL | 0 refills | Status: AC

## 2019-07-12 MED ORDER — ADULT MULTIVITAMIN W/MINERALS CH: 1.0000 | ORAL_TABLET | Freq: Every day | ORAL | 0 refills | Status: AC

## 2019-07-12 NOTE — TOC Transition Note (Addendum)
Transition of Care [facility]) - CM/SW Discharge Note   Patient Details  Name: Luis Orr MRN: [id number] Date of Birth: 12-Jun-1950  [facility] CM/SW Contact:  Vinie Sill, [place] Phone Number: 07/12/2019, 2:36 PM   Clinical Narrative:     Patient will DC to: [facility] DC Date: 07/12/2019 Family Notified: June, spouse  Transport By: Karen Chafe   Per MD patient is ready for discharge. RN, patient, and facility notified of DC. Discharge Summary faxed  to facility. RN given number for [phone] Room 311, Bed A, admitting MD Barbie Haggis. [facility] transport requested for patient.    Clinical Social Worker signing off.   Thurmond Butts, MSW, [place] Clinical Social Worker    Final next level of care: Skilled Nursing Facility Barriers to Discharge: Barriers Resolved   Patient Goals and CMS Choice        Discharge Placement              Patient chooses bed at: [facility] Patient to be transferred to facility by: [place] Name of family member notified: June,spouse- left voice message Patient and family notified of of transfer: 07/12/19  Discharge Plan and Services                                     Social Determinants of Health (SDOH) Interventions     Readmission Risk Interventions No flowsheet data found.

## 2019-07-12 NOTE — Discharge Summary (Signed)
Physician Discharge Summary  Luis Orr CHE:[id number] DOB: 05/18/1950 DOA: 07/06/2019  PCP: Townsend Roger, MD  Admit date: 07/06/2019 Discharge date: 07/12/2019  Time spent: 30 minutes  Recommendations for Outpatient Follow-up:   positive coag negative staph 1/2 most likely contaminant -Positive methicillin-resistant Staphylococcus awaiting speciation -DC vancomycin trach aspirate WNL and blood culture most likely contaminant  Sepsis UTI/positive Klebsiella pneumoniae/positive ESBL -Continue meropenem x5 days  -D5-0.9% saline 81ml/hr -6/27 patient should be stable for discharge on 6/29 at completion of antibiotics  Chronic ventilator/VAP? -Per PCCM -Trach aspirate being reintubated -6/26 tracheostomy secretions do not appear to be infective.  Scopolamine patch, monitor carefully to ensure secretions do not become too thick to suction.  Fecal impaction  -Per patient resolved -Continue tube feeds  Essential HTN -Amlodipine 10 mg daily -Clonidine 0.1 mg TID -6/27 lisinopril 5 mg daily  Diabetes type 2 hold with complication -5/36 hemoglobin A1c 7.0 -Levemir 10 units daily -6/26 NovoLog 5 units TID -Sensitive SSI  Hypokalemia -resolved  Hypophosphatemia -K-Phos 20 mmol  Stage III sacral decubitus ulcer Pressure Injury 07/07/19 Sacrum Mid Stage 3 -  Full thickness tissue loss. Subcutaneous fat may be visible but bone, tendon or muscle are NOT exposed. (Active)  07/07/19 2030  Location: Sacrum  Location Orientation: Mid  Staging: Stage 3 -  Full thickness tissue loss. Subcutaneous fat may be visible but bone, tendon or muscle are NOT exposed.  Wound Description (Comments):   Present on Admission: Yes         Discharge Diagnoses:  Principal Problem:   Sepsis secondary to UTI [facility]) Active Problems:   Functional quadriplegia (HCC)   Chronic respiratory failure requiring continuous mechanical ventilation through tracheostomy ([place])   Fecal  impaction (HCC)   HTN (hypertension)   DM2 (diabetes mellitus, type 2) ([place])   Bacteremia due to Staphylococcus   Discharge Condition: Stable  Diet recommendation: Tube feed  Filed Weights   07/11/19 0100 07/11/19 0103 07/12/19 0500  Weight: 72.7 kg 72.7 kg 75.2 kg    History of present illness:  69 y.o.BM PMHx  anxiety, diabetes type 2 controlled with complication, HTN, urine retention, chronic respiratory failure on vent.  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 Pt is Vent dependent as a result since end of 2018. Pt has been residing in [facility] on chronic vent since Jan 2019.  Despite the quadriplegia and respiratory failure, pt is apparently able to urinate without the use of a catheter.  Pt sent in to the ED today with c/o abd pain. Concern for worsening abd distention and "ileus".  Patient seems AAOx3, but history taking is difficult secondary to pt being on chronic mechanical ventilator and unable to make audible words (have to lip read patient). Thus most of the history is confirmed from his wife.  Hospital Course:  See above    Ventilator Mode PRVC 6/29 FiO2 40% Rate set; 16 PEEP; 5 SPO2; 100%   Consultations: PCCM  Cultures  6/23 blood positive coag negative staph 1/2 most likely contaminant 6/23 blood NGTD 6/23 urine positive Klebsiella pneumoniae/positive ESBL 6/24 tracheal aspirate consistent with normal respiratory flora   Antibiotics Anti-infectives (From admission, onward)   Start     Ordered Stop   07/07/19 1300  vancomycin (VANCOCIN) IVPB 1000 mg/200 mL premix  Status:  Discontinued        07/07/19 0048 07/09/19 0918   07/07/19 0200  meropenem (MERREM) 1 g in sodium chloride 0.9 % 100 mL IVPB  Discontinue     07/07/19 0048 07/12/19 2359   07/07/19 0100  piperacillin-tazobactam (ZOSYN) IVPB 3.375 g  Status:  Discontinued        07/06/19 2304 07/07/19 0041   07/07/19 0100  vancomycin (VANCOREADY) IVPB 1500  mg/300 mL        07/07/19 0048 07/07/19 0343   07/06/19 1900  piperacillin-tazobactam (ZOSYN) IVPB 3.375 g        07/06/19 1849 07/06/19 2000       Discharge Exam: Vitals:   07/12/19 0749 07/12/19 0810 07/12/19 1148 07/12/19 1226  BP: 120/79  122/69   Pulse: 74  77   Resp: 20  (!) 22   Temp: 98.1 F (36.7 C)  98.3 F (36.8 C)   TempSrc:      SpO2: 100% 99% 100% 100%  Weight:      Height:        General: Alert, follows all commands, nods yes and no to questions as well as [employer].  Positive chronic respiratory distress Eyes: negative scleral hemorrhage, negative anisocoria, negative icterus ENT: Negative Runny nose, negative gingival bleeding, Neck:  Negative scars, masses, torticollis, lymphadenopathy, JVD #6 cuffed Shiley in place covered and clean negative sign of infection Lungs: Clear to auscultation bilaterally without wheezes or crackles Cardiovascular: Regular rate and rhythm without murmur gallop or rub normal S1 and S2   Discharge Instructions   Allergies as of 07/12/2019   No Known Allergies     Medication List    STOP taking these medications   benzonatate 100 MG capsule Commonly known as: TESSALON   carboxymethylcellulose 0.5 % Soln Commonly known as: REFRESH PLUS   docusate sodium 100 MG capsule Commonly known as: COLACE Replaced by: docusate 50 MG/5ML liquid   furosemide 40 MG tablet Commonly known as: LASIX   gabapentin 300 MG capsule Commonly known as: NEURONTIN Replaced by: gabapentin 250 MG/5ML solution   hydrALAZINE 25 MG tablet Commonly known as: APRESOLINE   loperamide 2 MG tablet Commonly known as: IMODIUM A-D   potassium chloride 20 MEQ/15ML (10%) Soln   senna 8.6 MG Tabs tablet Commonly known as: SENOKOT     TAKE these medications   acetaminophen 160 MG/5ML solution Commonly known as: TYLENOL Place 20.3 mLs (650 mg total) into feeding tube every 6 (six) hours as needed for mild pain (or Fever >/= 101).    acetaminophen 650 MG suppository Commonly known as: TYLENOL Place 1 suppository (650 mg total) rectally every 6 (six) hours as needed for mild pain (or Fever >/= 101).   amLODipine 10 MG tablet Commonly known as: NORVASC Take 10 mg by mouth daily.   chlorhexidine 0.12 % solution Commonly known as: PERIDEX Use as directed 5 mLs in the mouth or throat 2 (two) times daily.   clonazePAM 0.5 MG tablet Commonly known as: KLONOPIN Place 1 tablet (0.5 mg total) into feeding tube every 8 (eight) hours. What changed: how to take this   cloNIDine 0.1 MG tablet Commonly known as: CATAPRES Place 1 tablet (0.1 mg total) into feeding tube every 8 (eight) hours. What changed: how to take this   docusate 50 MG/5ML liquid Commonly known as: COLACE Place 10 mLs (100 mg total) into feeding tube every 12 (twelve) hours. Replaces: docusate sodium 100 MG capsule   famotidine 20 MG tablet Commonly known as: PEPCID Place 1 tablet (20 mg total) into feeding tube at bedtime. What changed:   medication strength  how to take this   feeding supplement (OSMOLITE  1.5 CAL) Liqd Place 1,000 mLs into feeding tube continuous.   feeding supplement (PRO-STAT SUGAR FREE 64) Liqd Place 30 mLs into feeding tube 3 (three) times daily.   fentaNYL 25 MCG/HR Commonly known as: [place] 1 patch onto the skin every 3 (three) days. Start taking on: July 14, 2019   gabapentin 250 MG/5ML solution Commonly known as: NEURONTIN Place 6 mLs (300 mg total) into feeding tube 2 (two) times daily. Replaces: gabapentin 300 MG capsule   HYDROcodone-acetaminophen 7.5-325 MG tablet Commonly known as: [place] 1 tablet into feeding tube 2 (two) times daily. What changed:   how to take this  when to take this   insulin detemir 100 UNIT/ML injection Commonly known as: LEVEMIR Inject 10 Units into the skin at bedtime.   insulin regular 100 units/mL injection Commonly known as: NOVOLIN R Inject 2-10 Units  into the skin in the morning, at noon, in the evening, and at bedtime. Per sliding scale:  151-200 = 2 units, 201-250= 4 units, 251-300= 6 units, 301-350= 8 units, 351-400= 10 units.   ipratropium-albuterol 0.5-2.5 (3) MG/3ML Soln Commonly known as: DUONEB Take 3 mLs by nebulization daily as needed (wheezing).   lisinopril 5 MG tablet Commonly known as: ZESTRIL Take 1 tablet (5 mg total) by mouth daily. Start taking on: July 13, 2019   multivitamin with minerals Tabs tablet Take 1 tablet by mouth daily. Start taking on: July 13, 2019   Olopatadine HCl 0.2 % Soln Place 1 drop into both eyes daily.   ondansetron 4 MG tablet Commonly known as: ZOFRAN Take 1 tablet (4 mg total) by mouth every 6 (six) hours as needed for nausea.   polyethylene glycol 17 g packet Commonly known as: MIRALAX / GLYCOLAX Take 17 g by mouth daily as needed for mild constipation.   scopolamine 1 MG/3DAYS Commonly known as: TRANSDERM-SCOP Place 1 patch (1.5 mg total) onto the skin every 3 (three) days.   sertraline 100 MG tablet Commonly known as: ZOLOFT Place 1 tablet (100 mg total) into feeding tube at bedtime. What changed: how to take this   simethicone 80 MG chewable tablet Commonly known as: MYLICON Place 1 tablet (80 mg total) into feeding tube every 8 (eight) hours as needed for flatulence. What changed: how to take this      No Known Allergies    The results of significant diagnostics from this hospitalization (including imaging, microbiology, ancillary and laboratory) are listed below for reference.    Significant Diagnostic Studies: CT ABDOMEN PELVIS W CONTRAST  Result Date: 07/06/2019 CLINICAL DATA:  Worsening distension vomiting. EXAM: CT ABDOMEN AND PELVIS WITH CONTRAST TECHNIQUE: Multidetector CT imaging of the abdomen and pelvis was performed using the standard protocol following bolus administration of intravenous contrast. CONTRAST:  148mL OMNIPAQUE IOHEXOL 300 MG/ML  SOLN  COMPARISON:  Abdominal CT 05/15/2019 FINDINGS: Lower chest: Again seen dependent bilateral lower lobe airspace consolidations with air bronchograms, progressed from prior exam. Heart is normal in size. Hepatobiliary: No focal liver abnormality is seen. No gallstones, gallbladder wall thickening, or biliary dilatation. Pancreas: No ductal dilatation or inflammation. Spleen: Normal in size without focal abnormality. Adrenals/Urinary Tract: No adrenal nodule. Marked right renal parenchymal atrophy and cortical scarring. Inferior is of perfused cortex in the upper medial and inferior pole. Small cyst in the right kidney. Multiple cysts in the left kidney. Nonobstructing left renal stones. No hydronephrosis of either kidney. No significant perinephric edema. Distended urinary bladder that is displaced anteriorly by large stool  burden in the distal colon. No bowel thickening. Stomach/Bowel: Gastrostomy tube appropriately position in the stomach. Stomach is otherwise unremarkable. Scattered air in fluid-filled small bowel in a nonobstructive pattern. No small bowel inflammation. Normal appendix. Mild colonic distension with air and stool. The sigmoid colon is redundant. The distal sigmoid colon is distended with stool within inspissated stool ball distending the rectum. There is rectal distention of 7.2 cm. Mild distal perirectal thickening. No trace perirectal edema. Scattered colonic diverticula without diverticulitis. Vascular/Lymphatic: Tortuous abdominal aorta with atherosclerosis. No aneurysm. Patent portal vein. No abdominopelvic adenopathy. Reproductive: Prostate is unremarkable. Other: No free air, free fluid, or intra-abdominal fluid collection. Tiny fat containing umbilical hernia. Musculoskeletal: Stable degenerative in chronic change in the spine. Near complete disc space loss at L2-L3 with endplate irregularity is unchanged. IMPRESSION: 1. Large volume of stool in the distal sigmoid colon and rectum with  inspissated stool ball in the rectal distension. Mild distal perirectal thickening. Findings suspicious for fecal impaction possible stercoral colitis. Slight improvement in wall thickening from prior. 2. No evidence of bowel obstruction. Scattered air in fluid throughout small bowel that is nondilated may represent subsequent ileus. 3. Distended urinary bladder that is displaced anteriorly by large stool burden in the distal colon. 4. Progressive dependent bilateral lower lobe airspace consolidations with air bronchograms, suspicious for pneumonia/aspiration. 5. Nonobstructing left renal stones. Marked right renal parenchymal atrophy and cortical scarring. Aortic Atherosclerosis (ICD10-I70.0). Electronically Signed   By: Keith Rake M.D.   On: 07/06/2019 21:49   DG Chest Port 1 View  Result Date: 07/08/2019 CLINICAL DATA:  Abnormal respiration, tracheostomy EXAM: PORTABLE CHEST 1 VIEW COMPARISON:  Radiograph 07/06/2019 FINDINGS: Tracheostomy tube terminates in the mid trachea, approximately 5 cm from the carina. Telemetry leads and ventilator tubing overlie the chest. There are layering bilateral pleural effusions and basilar areas of atelectasis. More patchy opacity in the right infrahilar lung could reflect further atelectatic change or developing airspace disease. No pneumothorax. Stable cardiomediastinal contours. No acute osseous or soft tissue abnormality. Degenerative changes are present in the imaged spine and shoulders. IMPRESSION: 1. Layering bilateral pleural effusions with basilar atelectasis. 2. More patchy opacity in the right infrahilar lung could reflect further atelectatic change or developing airspace disease. Electronically Signed   By: Lovena Le M.D.   On: 07/08/2019 06:31   DG Chest Port 1 View  Result Date: 07/06/2019 CLINICAL DATA:  Sepsis and abdominal pain EXAM: PORTABLE CHEST 1 VIEW COMPARISON:  05/15/2019 FINDINGS: Tracheostomy tube tip is at the level of the clavicular  heads. There is bibasilar atelectasis. No pleural effusion or pneumothorax. Normal cardiomediastinal contours. IMPRESSION: Bibasilar atelectasis. Electronically Signed   By: Ulyses Jarred M.D.   On: 07/06/2019 19:20    Microbiology: Recent Results (from the past 240 hour(s))  Urine culture     Status: Abnormal   Collection Time: 07/06/19  6:47 PM   Specimen: In/Out Cath Urine  Result Value Ref Range Status   Specimen Description IN/OUT CATH URINE  Final   Special Requests   Final    NONE Performed at [facility], [address], [place], [place] [address]    Culture (A)  Final    >=100,000 COLONIES/mL KLEBSIELLA PNEUMONIAE Confirmed Extended Spectrum Beta-Lactamase Producer (ESBL).  In bloodstream infections from ESBL organisms, carbapenems are preferred over piperacillin/tazobactam. They are shown to have a lower risk of mortality.    Report Status 07/09/2019 FINAL  Final   Organism ID, Bacteria KLEBSIELLA PNEUMONIAE (A)  Final  Susceptibility   Klebsiella pneumoniae - MIC*    AMPICILLIN >=32 RESISTANT Resistant     CEFAZOLIN >=64 RESISTANT Resistant     CEFTRIAXONE >=64 RESISTANT Resistant     CIPROFLOXACIN >=4 RESISTANT Resistant     GENTAMICIN >=16 RESISTANT Resistant     IMIPENEM <=0.25 SENSITIVE Sensitive     NITROFURANTOIN 64 INTERMEDIATE Intermediate     TRIMETH/SULFA >=320 RESISTANT Resistant     AMPICILLIN/SULBACTAM >=32 RESISTANT Resistant     PIP/TAZO 16 SENSITIVE Sensitive     * >=100,000 COLONIES/mL KLEBSIELLA PNEUMONIAE  Blood Culture (routine x 2)     Status: Abnormal   Collection Time: 07/06/19  6:53 PM   Specimen: BLOOD  Result Value Ref Range Status   Specimen Description BLOOD SITE NOT SPECIFIED  Final   Special Requests   Final    BOTTLES DRAWN AEROBIC AND ANAEROBIC Blood Culture adequate volume   Culture  Setup Time   Final    GRAM POSITIVE COCCI IN CLUSTERS AEROBIC BOTTLE ONLY CRITICAL RESULT CALLED TO, READ BACK BY AND VERIFIED WITH: ABjorn Loser 16:35 07/07/19 ([email or web address])    Culture (A)  Final    STAPHYLOCOCCUS SPECIES (COAGULASE NEGATIVE) THE SIGNIFICANCE OF ISOLATING THIS ORGANISM FROM A SINGLE SET OF BLOOD CULTURES WHEN MULTIPLE SETS ARE DRAWN IS UNCERTAIN. PLEASE NOTIFY THE MICROBIOLOGY DEPARTMENT WITHIN ONE WEEK IF SPECIATION AND SENSITIVITIES ARE REQUIRED. Performed at [facility], [place] [address] [place], [place] [address]    Report Status 07/08/2019 FINAL  Final  Blood Culture ID Panel (Reflexed)     Status: Abnormal   Collection Time: 07/06/19  6:53 PM  Result Value Ref Range Status   Enterococcus species NOT DETECTED NOT DETECTED Final   Listeria monocytogenes NOT DETECTED NOT DETECTED Final   Staphylococcus species DETECTED (A) NOT DETECTED Final    Comment: Methicillin (oxacillin) resistant coagulase negative staphylococcus. Possible blood culture contaminant (unless isolated from more than one blood culture draw or clinical case suggests pathogenicity). No antibiotic treatment is indicated for blood  culture contaminants. CRITICAL RESULT CALLED TO, READ BACK BY AND VERIFIED WITH: Reece Packer PharmD 16:35 07/07/19 ([email or web address])    Staphylococcus aureus (BCID) NOT DETECTED NOT DETECTED Final   Methicillin resistance DETECTED (A) NOT DETECTED Final    Comment: CRITICAL RESULT CALLED TO, READ BACK BY AND VERIFIED WITH: Reece Packer PharmD 16:35 07/07/19 ([email or web address])    Streptococcus species NOT DETECTED NOT DETECTED Final   Streptococcus agalactiae NOT DETECTED NOT DETECTED Final   Streptococcus pneumoniae NOT DETECTED NOT DETECTED Final   Streptococcus pyogenes NOT DETECTED NOT DETECTED Final   Acinetobacter baumannii NOT DETECTED NOT DETECTED Final   Enterobacteriaceae species NOT DETECTED NOT DETECTED Final   Enterobacter cloacae complex NOT DETECTED NOT DETECTED Final   Escherichia coli NOT DETECTED NOT DETECTED Final   Klebsiella oxytoca NOT DETECTED NOT DETECTED Final   Klebsiella pneumoniae NOT DETECTED NOT  DETECTED Final   Proteus species NOT DETECTED NOT DETECTED Final   Serratia marcescens NOT DETECTED NOT DETECTED Final   Haemophilus influenzae NOT DETECTED NOT DETECTED Final   Neisseria meningitidis NOT DETECTED NOT DETECTED Final   Pseudomonas aeruginosa NOT DETECTED NOT DETECTED Final   Candida albicans NOT DETECTED NOT DETECTED Final   Candida glabrata NOT DETECTED NOT DETECTED Final   Candida krusei NOT DETECTED NOT DETECTED Final   Candida parapsilosis NOT DETECTED NOT DETECTED Final   Candida tropicalis NOT DETECTED NOT DETECTED Final    Comment: Performed at [facility],  [address] [place], [place] [address]  Blood Culture (routine x 2)     Status: None   Collection Time: 07/06/19  6:57 PM   Specimen: BLOOD  Result Value Ref Range Status   Specimen Description BLOOD SITE NOT SPECIFIED  Final   Special Requests   Final    BOTTLES DRAWN AEROBIC AND ANAEROBIC Blood Culture adequate volume   Culture   Final    NO GROWTH 5 DAYS Performed at [facility] [address] [place], [place] [address]    Report Status 07/11/2019 FINAL  Final  SARS Coronavirus 2 by RT PCR (hospital order, performed in [facility] hospital lab) Nasopharyngeal Nasopharyngeal Swab     Status: None   Collection Time: 07/06/19 10:30 PM   Specimen: Nasopharyngeal Swab  Result Value Ref Range Status   SARS Coronavirus 2 NEGATIVE NEGATIVE Final    Comment: (NOTE) SARS-CoV-2 target nucleic acids are NOT DETECTED.  The SARS-CoV-2 RNA is generally detectable in upper and lower respiratory specimens during the acute phase of infection. The lowest concentration of SARS-CoV-2 viral copies this assay can detect is 250 copies / mL. A negative result does not preclude SARS-CoV-2 infection and should not be used as the sole basis for treatment or other patient management decisions.  A negative result may occur with improper specimen collection / handling, submission of specimen other than  nasopharyngeal swab, presence of viral mutation(s) within the areas targeted by this assay, and inadequate number of viral copies (<250 copies / mL). A negative result must be combined with clinical observations, patient history, and epidemiological information.  Fact Sheet for Patients:   StrictlyIdeas.no  Fact Sheet for Healthcare Providers: BankingDealers.co.za  This test is not yet approved or  cleared by the [place] FDA and has been authorized for detection and/or diagnosis of SARS-CoV-2 by FDA under an Emergency Use Authorization (EUA).  This EUA will remain in effect (meaning this test can be used) for the duration of the COVID-19 declaration under Section 564(b)(1) of the Act, 21 U.S.C. section 360bbb-3(b)(1), unless the authorization is terminated or revoked sooner.  Performed at [facility], [place] [address], [place], [place] [address]   Culture, respiratory (non-expectorated)     Status: None   Collection Time: 07/07/19  4:25 AM   Specimen: Tracheal Aspirate; Respiratory  Result Value Ref Range Status   Specimen Description TRACHEAL ASPIRATE  Final   Special Requests NONE  Final   Gram Stain   Final    FEW WBC PRESENT, PREDOMINANTLY PMN NO ORGANISMS SEEN    Culture   Final    FEW Consistent with normal respiratory flora. Performed at [facility], [place] [address], [place], [place] [address]    Report Status 07/09/2019 FINAL  Final  MRSA PCR Screening     Status: None   Collection Time: 07/07/19  8:49 PM   Specimen: Nasopharyngeal  Result Value Ref Range Status   MRSA by PCR NEGATIVE NEGATIVE Final    Comment:        The GeneXpert MRSA Assay (FDA approved for NASAL specimens only), is one component of a comprehensive MRSA colonization surveillance program. It is not intended to diagnose MRSA infection nor to guide or monitor treatment for MRSA infections. Performed at [facility],  [place] [address] [place], [place] [address]   SARS CORONAVIRUS 2 (TAT 6-24 HRS) Nasopharyngeal Nasopharyngeal Swab     Status: None   Collection Time: 07/11/19 11:15  AM   Specimen: Nasopharyngeal Swab  Result Value Ref Range Status   SARS Coronavirus 2 NEGATIVE NEGATIVE Final    Comment: (NOTE) SARS-CoV-2 target nucleic acids are NOT DETECTED.  The SARS-CoV-2 RNA is generally detectable in upper and lower respiratory specimens during the acute phase of infection. Negative results do not preclude SARS-CoV-2 infection, do not rule out co-infections with other pathogens, and should not be used as the sole basis for treatment or other patient management decisions. Negative results must be combined with clinical observations, patient history, and epidemiological information. The expected result is Negative.  Fact Sheet for Patients: SugarRoll.be  Fact Sheet for Healthcare Providers: https://www.Lanisha Stepanian-mathews.com/  This test is not yet approved or cleared by the [place] FDA and  has been authorized for detection and/or diagnosis of SARS-CoV-2 by FDA under an Emergency Use Authorization (EUA). This EUA will remain  in effect (meaning this test can be used) for the duration of the COVID-19 declaration under Se ction 564(b)(1) of the Act, 21 U.S.C. section 360bbb-3(b)(1), unless the authorization is terminated or revoked sooner.  Performed at [facility], [place] [address] [place], [place] [address]      Labs: Basic Metabolic Panel: Recent Labs  Lab 07/08/19 0510 07/09/19 1315 07/10/19 0245 07/11/19 0843 07/12/19 0626  NA 143 141 144 143 141  K 3.7 3.1* 3.7 3.9 4.1  CL 109 110 111 111 108  CO2 22 21* 23 26 24   GLUCOSE 96 233* 189* 146* 142*  BUN 18 18 22 20 20   CREATININE 0.47* 0.34* 0.35* 0.30* 0.38*  CALCIUM 9.2 8.7* 8.8* 8.7* 8.7*  MG 2.0 1.8 1.8 1.8 1.9  PHOS 2.4* 2.0* 2.2* 1.9* 2.3*   Liver Function Tests: Recent Labs   Lab 07/08/19 0510 07/09/19 1315 07/10/19 0245 07/11/19 0843 07/12/19 0626  AST 19 20 48* 85* 40  ALT 24 25 42 106* 74*  ALKPHOS 64 60 60 73 69  BILITOT 1.4* 0.8 0.8 0.4 0.4  PROT 7.5 6.9 6.7 6.7 7.0  ALBUMIN 2.9* 2.6* 2.5* 2.6* 2.6*   Recent Labs  Lab 07/06/19 1754  LIPASE 26   No results for input(s): AMMONIA in the last 168 hours. CBC: Recent Labs  Lab 07/08/19 0510 07/09/19 1315 07/10/19 0245 07/11/19 0843 07/12/19 0626  WBC 8.9 7.0 6.1 8.4 10.8*  NEUTROABS 6.0 4.0 3.4 4.9 6.8  HGB 11.4* 10.4* 10.1* 10.6* 10.9*  HCT 34.3* 31.4* 31.2* 32.4* 33.7*  MCV 72.2* 72.5* 73.6* 73.3* 73.9*  PLT 325 332 309 329 316   Cardiac Enzymes: No results for input(s): CKTOTAL, CKMB, CKMBINDEX, TROPONINI in the last 168 hours. BNP: BNP (last 3 results) No results for input(s): BNP in the last 8760 hours.  ProBNP (last 3 results) No results for input(s): PROBNP in the last 8760 hours.  CBG: Recent Labs  Lab 07/11/19 1946 07/11/19 2341 07/12/19 0455 07/12/19 0732 07/12/19 1121  GLUCAP 124* 171* 170* 132* 101*       Signed:  Dia Crawford, MD [facility] Hospitalists [phone] pager

## 2019-07-12 NOTE — Progress Notes (Signed)
Report called to nurse [name] at [facility]. Pt transported by [facility] to new facility. Katherina Right RN

## 2019-07-12 NOTE — Progress Notes (Signed)
This nurse attempted to call report to [facility] at the number provided by sw [phone]. There was no answer. Will attempt again soon. Katherina Right RN

## 2019-07-12 NOTE — TOC Progression Note (Signed)
Transition of Care [facility]) - Progression Note    Patient Details  Name: OWAIN ECKERMAN MRN: [id number] Date of Birth: 1950/05/31  Transition of Care [facility]) CM/SW [place], [place] Phone Number: 07/12/2019, 5:42 PM  Clinical Narrative:     Correct report information- RN given number for [phone] Room 311, Bed A, admitting MD Barbie Haggis.    Barriers to Discharge: Barriers Resolved  Expected Discharge Plan and Services           Expected Discharge Date: 07/12/19                                     Social Determinants of Health (SDOH) Interventions    Readmission Risk Interventions No flowsheet data found.

## 2019-07-16 ENCOUNTER — Emergency Department (HOSPITAL_COMMUNITY): Payer: Medicare Other

## 2019-07-16 ENCOUNTER — Observation Stay (HOSPITAL_COMMUNITY)
Admission: EM | Admit: 2019-07-16 | Discharge: 2019-07-17 | Disposition: A | Discharge status: Skilled Nursing Facility | Payer: Medicare Other | Attending: Internal Medicine | Admitting: Internal Medicine

## 2019-07-16 ENCOUNTER — Encounter (HOSPITAL_COMMUNITY): Payer: Self-pay

## 2019-07-16 DIAGNOSIS — Z794 Long term (current) use of insulin: Secondary | ICD-10-CM | POA: Insufficient documentation

## 2019-07-16 DIAGNOSIS — I4891 Unspecified atrial fibrillation: Secondary | ICD-10-CM | POA: Insufficient documentation

## 2019-07-16 DIAGNOSIS — R52 Pain, unspecified: Secondary | ICD-10-CM | POA: Diagnosis present

## 2019-07-16 DIAGNOSIS — Z9911 Dependence on respirator [ventilator] status: Secondary | ICD-10-CM

## 2019-07-16 DIAGNOSIS — I10 Essential (primary) hypertension: Secondary | ICD-10-CM | POA: Insufficient documentation

## 2019-07-16 DIAGNOSIS — Z930 Tracheostomy status: Secondary | ICD-10-CM | POA: Diagnosis not present

## 2019-07-16 DIAGNOSIS — R109 Unspecified abdominal pain: Secondary | ICD-10-CM

## 2019-07-16 DIAGNOSIS — R4182 Altered mental status, unspecified: Secondary | ICD-10-CM | POA: Diagnosis present

## 2019-07-16 DIAGNOSIS — E119 Type 2 diabetes mellitus without complications: Secondary | ICD-10-CM | POA: Diagnosis not present

## 2019-07-16 DIAGNOSIS — R6510 Systemic inflammatory response syndrome (SIRS) of non-infectious origin without acute organ dysfunction: Secondary | ICD-10-CM | POA: Diagnosis not present

## 2019-07-16 DIAGNOSIS — R532 Functional quadriplegia: Secondary | ICD-10-CM | POA: Diagnosis not present

## 2019-07-16 DIAGNOSIS — Z79899 Other long term (current) drug therapy: Secondary | ICD-10-CM | POA: Diagnosis not present

## 2019-07-16 DIAGNOSIS — J9610 Chronic respiratory failure, unspecified whether with hypoxia or hypercapnia: Principal | ICD-10-CM

## 2019-07-16 DIAGNOSIS — F419 Anxiety disorder, unspecified: Secondary | ICD-10-CM | POA: Insufficient documentation

## 2019-07-16 DIAGNOSIS — Z20822 Contact with and (suspected) exposure to covid-19: Secondary | ICD-10-CM | POA: Diagnosis not present

## 2019-07-16 LAB — CBC
HCT: 31.5 % — ABNORMAL LOW (ref 39.0–52.0)
Hemoglobin: 10.3 g/dL — ABNORMAL LOW (ref 13.0–17.0)
MCH: 23.4 pg — ABNORMAL LOW (ref 26.0–34.0)
MCHC: 32.7 g/dL (ref 30.0–36.0)
MCV: 71.6 fL — ABNORMAL LOW (ref 80.0–100.0)
Platelets: 360 10*3/uL (ref 150–400)
RBC: 4.4 MIL/uL (ref 4.22–5.81)
RDW: 16.6 % — ABNORMAL HIGH (ref 11.5–15.5)
WBC: 14.1 10*3/uL — ABNORMAL HIGH (ref 4.0–10.5)
nRBC: 0 % (ref 0.0–0.2)

## 2019-07-16 LAB — BASIC METABOLIC PANEL
Anion gap: 10 (ref 5–15)
BUN: 23 mg/dL (ref 8–23)
CO2: 27 mmol/L (ref 22–32)
Calcium: 8.9 mg/dL (ref 8.9–10.3)
Chloride: 101 mmol/L (ref 98–111)
Creatinine, Ser: 0.36 mg/dL — ABNORMAL LOW (ref 0.61–1.24)
GFR calc Af Amer: >60 mL/min (ref >60)
GFR calc non Af Amer: >60 mL/min (ref >60)
Glucose, Bld: 178 mg/dL — ABNORMAL HIGH (ref 70–99)
Potassium: 4.3 mmol/L (ref 3.5–5.1)
Sodium: 138 mmol/L (ref 135–145)

## 2019-07-16 LAB — URINALYSIS, ROUTINE W REFLEX MICROSCOPIC
Bilirubin Urine: NEGATIVE
Glucose, UA: 150 mg/dL — AB
Hgb urine dipstick: NEGATIVE
Ketones, ur: NEGATIVE mg/dL
Nitrite: NEGATIVE
Protein, ur: 100 mg/dL — AB
Specific Gravity, Urine: 1.014 (ref 1.005–1.030)
pH: 7 (ref 5.0–8.0)

## 2019-07-16 NOTE — ED Provider Notes (Signed)
[facility] EMERGENCY DEPARTMENT Provider Note   CSN: [id number] Arrival date & time: 07/16/19  2106     History Chief Complaint  Patient presents with  . EKG Change    Luis Orr is a 69 y.o. male who presents via [facility] from [facility] with concern for EKG change. [facility] concerned for possible "STEMI" on EKG. [facility] reports EKG more concerning for "PVC". 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   Bedside nurse at [facility] concerned for change in mentation, and acute distress acting like "air hunger" with elevated BP (SBP 180s), so EKG was completed. MD at [facility] with concern for acute MI on EKG.  Appeared more tired today. Nursing at [facility] states mentation different than prior to last admission and worse than day prior.      Past Medical History:  Diagnosis Date  . Anemia   . Anxiety   . Chronic respiratory failure ([place])   . DM2 (diabetes mellitus, type 2) ([place])   . Functional quadriplegia (HCC)   . Hypertension   . Urine retention     Patient Active Problem List   Diagnosis Date Noted  . AMS (altered mental status) 07/17/2019  . SIRS (systemic inflammatory response syndrome) (HCC)   . Ventilator dependence ([place])   . Bacteremia due to Staphylococcus 07/07/2019  . Functional quadriplegia ([place]) 07/06/2019  . Chronic respiratory failure ([place]) 07/06/2019  . Sepsis secondary to UTI ([place]) 07/06/2019  . Fecal impaction ([place]) 07/06/2019  . HTN (hypertension) 07/06/2019  . DM2 (diabetes mellitus, type 2) ([place]) 07/06/2019    Past Surgical History:  Procedure Laterality Date  . PEG PLACEMENT    . ROTATOR CUFF REPAIR    . TRACHEOSTOMY         History reviewed. No pertinent family history.  Social History   Tobacco Use  . Smoking status: Never Smoker  . Smokeless tobacco: Never Used  Substance Use Topics  . Alcohol use: Not on file  . Drug use: Not on file    Home Medications Prior to  Admission medications   Medication Sig Start Date End Date Taking? Authorizing Provider  acetaminophen (TYLENOL) 160 MG/5ML solution Place 20.3 mLs (650 mg total) into feeding tube every 6 (six) hours as needed for mild pain (or Fever >/= 101). 07/12/19  Yes Allie Bossier, MD  acetaminophen (TYLENOL) 650 MG suppository Place 1 suppository (650 mg total) rectally every 6 (six) hours as needed for mild pain (or Fever >/= 101). 07/12/19  Yes Allie Bossier, MD  Amino Acids-Protein Hydrolys (FEEDING SUPPLEMENT, PRO-STAT SUGAR FREE 64,) LIQD Place 30 mLs into feeding tube 3 (three) times daily. 07/12/19  Yes Allie Bossier, MD  amLODipine (NORVASC) 10 MG tablet Place 10 mg into feeding tube daily.    Yes [provider]  chlorhexidine (PERIDEX) 0.12 % solution Use as directed 5 mLs in the mouth or throat 2 (two) times daily.   Yes [provider]  clonazePAM (KLONOPIN) 0.5 MG tablet Place 1 tablet (0.5 mg total) into feeding tube every 8 (eight) hours. 07/12/19  Yes Allie Bossier, MD  cloNIDine (CATAPRES) 0.1 MG tablet Place 1 tablet (0.1 mg total) into feeding tube every 8 (eight) hours. 07/12/19  Yes Allie Bossier, MD  docusate (COLACE) 50 MG/5ML liquid Place 10 mLs (100 mg total) into feeding tube every 12 (twelve) hours. 07/12/19  Yes Allie Bossier, MD  famotidine (PEPCID) 20 MG tablet Place 1 tablet (20 mg  total) into feeding tube at bedtime. 07/12/19  Yes Allie Bossier, MD  fentaNYL (DURAGESIC) 25 MCG/HR Place 1 patch onto the skin every 3 (three) days. 07/14/19  Yes Allie Bossier, MD  gabapentin (NEURONTIN) 250 MG/5ML solution Place 6 mLs (300 mg total) into feeding tube 2 (two) times daily. 07/12/19  Yes Allie Bossier, MD  HYDROcodone-acetaminophen (NORCO) 7.5-325 MG tablet Place 1 tablet into feeding tube 2 (two) times daily. 07/12/19  Yes Allie Bossier, MD  insulin regular (NOVOLIN R) 100 units/mL injection Inject 2-10 Units into the skin in the morning, at noon, in the  evening, and at bedtime. Per sliding scale:  151-200 = 2 units, 201-250= 4 units, 251-300= 6 units, 301-350= 8 units, 351-400= 10 units.   Yes [provider]  ipratropium-albuterol (DUONEB) 0.5-2.5 (3) MG/3ML SOLN Take 3 mLs by nebulization every 4 (four) hours as needed (wheezing).    Yes [provider]  LEVEMIR FLEXTOUCH 100 UNIT/ML FlexPen Inject 10 Units into the skin at bedtime. 07/13/19  Yes [provider]  lisinopril (ZESTRIL) 5 MG tablet Take 1 tablet (5 mg total) by mouth daily. 07/13/19  Yes Allie Bossier, MD  Multiple Vitamin (MULTIVITAMIN WITH MINERALS) TABS tablet Take 1 tablet by mouth daily. 07/13/19  Yes Allie Bossier, MD  Olopatadine HCl 0.2 % SOLN Place 1 drop into both eyes daily.   Yes [provider]  ondansetron (ZOFRAN-ODT) 4 MG disintegrating tablet Take 4 mg by mouth every 6 (six) hours as needed for nausea or vomiting.   Yes [provider]  scopolamine (TRANSDERM-SCOP) 1 MG/3DAYS Place 1 patch (1.5 mg total) onto the skin every 3 (three) days. 07/12/19  Yes Allie Bossier, MD  sertraline (ZOLOFT) 100 MG tablet Place 1 tablet (100 mg total) into feeding tube at bedtime. 07/12/19  Yes Allie Bossier, MD  simethicone (MYLICON) 80 MG chewable tablet Place 1 tablet (80 mg total) into feeding tube every 8 (eight) hours as needed for flatulence. 07/12/19  Yes Allie Bossier, MD  insulin aspart (NOVOLOG) 100 UNIT/ML injection Inject 0-15 Units into the skin every 4 (four) hours. 07/17/19   Candee Furbish, MD  Nutritional Supplements (FEEDING SUPPLEMENT, OSMOLITE 1.5 CAL,) LIQD Place 1,000 mLs into feeding tube continuous. Patient not taking: Reported on 07/17/2019 07/12/19   Allie Bossier, MD  polyethylene glycol (MIRALAX / GLYCOLAX) 17 g packet Take 17 g by mouth daily. 07/18/19   Candee Furbish, MD    Allergies    Patient has no known allergies.  Review of Systems   Review of Systems  Unable to perform ROS: Patient nonverbal (Pt  non-verbal due to trachvent)  Constitutional: Negative for chills and fever.  Respiratory: Negative for cough.   Cardiovascular: Negative for chest pain.  Gastrointestinal: Negative for abdominal pain.  Musculoskeletal: Positive for back pain and neck pain.       Reports neck pain/back discomfort from stretcher   Skin: Negative for rash and wound.  Neurological: Negative for headaches.    Physical Exam Updated Vital Signs BP 138/77   Pulse 67   Temp 98.5 F (36.9 C) (Oral)   Resp 20   Ht 5\' 6"  (1.676 m)   Wt 66.7 kg   SpO2 100%   BMI 23.73 kg/m   Physical Exam Vitals and nursing note reviewed.  Constitutional:      General: He is not in acute distress.    Appearance: He is normal weight. He is  not toxic-appearing or diaphoretic.     Comments: Chronically ill appearing. Patient with trach vent in place.   HENT:     Head: Normocephalic and atraumatic.     Nose: Nose normal.     Mouth/Throat:     Mouth: Mucous membranes are moist.  Eyes:     Conjunctiva/sclera: Conjunctivae normal.  Cardiovascular:     Rate and Rhythm: Normal rate and regular rhythm.     Heart sounds: Normal heart sounds. No murmur heard.   Pulmonary:     Effort: No respiratory distress.     Comments: Mechanical breath sounds auscultated bilaterally.  Abdominal:     General: There is no distension.     Palpations: Abdomen is soft.     Tenderness: There is no abdominal tenderness. There is no guarding or rebound.  Musculoskeletal:     Right lower leg: No edema.     Left lower leg: No edema.  Skin:    General: Skin is warm.     Comments: Sacral ulcer visualized that is reported in medical records   Neurological:     Comments: Patient appeared to be oriented with appropriate yes/no head nodding to questions. Able to deny CP and complain of neck/back pain. Bedside nurse at [facility] w concern for AMS from baseline.      ED Results / Procedures / Treatments   Labs (all labs ordered are listed, but  only abnormal results are displayed) Labs Reviewed  URINE CULTURE - Abnormal; Notable for the following components:      Result Value   Culture   (*)    Value: >=100,000 COLONIES/mL KLEBSIELLA PNEUMONIAE SUSCEPTIBILITIES TO FOLLOW Performed at [facility], [address] [place], [place] [address]    All other components within normal limits  CBC - Abnormal; Notable for the following components:   WBC 14.1 (*)    Hemoglobin 10.3 (*)    HCT 31.5 (*)    MCV 71.6 (*)    MCH 23.4 (*)    RDW 16.6 (*)    All other components within normal limits  BASIC METABOLIC PANEL - Abnormal; Notable for the following components:   Glucose, Bld 178 (*)    Creatinine, Ser 0.36 (*)    All other components within normal limits  URINALYSIS, ROUTINE W REFLEX MICROSCOPIC - Abnormal; Notable for the following components:   Glucose, UA 150 (*)    Protein, ur 100 (*)    Leukocytes,Ua TRACE (*)    Bacteria, UA FEW (*)    All other components within normal limits  CBC - Abnormal; Notable for the following components:   WBC 11.4 (*)    Hemoglobin 10.3 (*)    HCT 30.6 (*)    MCV 71.5 (*)    MCH 24.1 (*)    RDW 16.8 (*)    All other components within normal limits  BASIC METABOLIC PANEL - Abnormal; Notable for the following components:   Glucose, Bld 118 (*)    Creatinine, Ser 0.42 (*)    All other components within normal limits  GLUCOSE, CAPILLARY - Abnormal; Notable for the following components:   Glucose-Capillary 127 (*)    All other components within normal limits  GLUCOSE, CAPILLARY - Abnormal; Notable for the following components:   Glucose-Capillary 104 (*)    All other components within normal limits  CULTURE, BLOOD (ROUTINE X 2)  CULTURE, BLOOD (ROUTINE X 2)  SARS CORONAVIRUS 2 BY RT PCR (HOSPITAL ORDER, [facility])  LACTIC ACID, PLASMA  LACTIC ACID, PLASMA  MAGNESIUM  PHOSPHORUS  GLUCOSE, CAPILLARY    EKG EKG  Interpretation  Date/Time:  Saturday July 16 2019 21:24:44 EDT Ventricular Rate:  112 PR Interval:    QRS Duration: 75 QT Interval:  358 QTC Calculation: 489 R Axis:   -4 Text Interpretation: Atrial fibrillation Abnormal R-wave progression, early transition Borderline prolonged QT interval Confirmed by Elnora Morrison [phone]) on 07/17/2019 12:24:06 AM   Radiology DG Abd 1 View  Result Date: 07/17/2019 CLINICAL DATA:  69 year old male with a history of abdominal pain EXAM: ABDOMEN - 1 VIEW COMPARISON:  CT 07/06/2019, no prior abdominal plain film FINDINGS: Limited plain film of the lower abdomen/pelvis demonstrates gas throughout the visualized small bowel and the colon, including distension of the rectum. No formed stool in the rectum as compared to the prior CT. No air-fluid level on the supine image. No radiopaque foreign body. IMPRESSION: Decreased formed stool within the rectum compared to the prior CT, however, gaseous distension of rectum, visualized colon, and small-bowel persists, potentially ileus. At least radiographic follow-up of the abnormal bowel gas pattern is suggested. Electronically Signed   By: Corrie Mckusick D.O.   On: 07/17/2019 08:03   DG Chest Port 1 View  Result Date: 07/16/2019 CLINICAL DATA:  Pain. EXAM: PORTABLE CHEST 1 VIEW COMPARISON:  Most recent radiograph 07/08/2019. FINDINGS: Tracheostomy tube tip at the thoracic inlet. Unchanged heart size and mediastinal contours. Hazy bibasilar opacities likely combination of atelectasis and pleural fluid. No pulmonary edema. No pneumothorax. Stable osseous structures. IMPRESSION: Hazy bibasilar opacities likely combination of atelectasis and pleural fluid, unchanged from prior exam. Electronically Signed   By: Keith Rake M.D.   On: 07/16/2019 22:06    Procedures Procedures (including critical care time)  Medications Ordered in ED Medications  lactated ringers bolus 500 mL ( Intravenous Stopped 07/17/19 0525)    ED  Course  I have reviewed the triage vital signs and the nursing notes.  Pertinent labs & imaging results that were available during my care of the patient were reviewed by me and considered in my medical decision making (see chart for details).    MDM Rules/Calculators/A&P                          Pt is a 69 yo M w PMHx significant for  DM2, HTN, functional quadriplegia w chronic respiratory failure on trachvent who presents from [place] for changes in behavior and EKG findings concerning for MI. On arrival pt afebrile, HDS, and NAD. On exam pt appeared to be answering questions appropriately. Pt endorses only neck, back pain. Denied feeling fever/chills, CP, abdominal pain. On exam mechanical breath sounds auscultated bilaterally. Abdomen soft, non-distended, non-tender. No rash present. B/l LE wo edema. Pt overall chronically ill appearing. Spoke with bedside nurse who was concerning patient was not acting at baseline and appeared to not be breathing as easily on vent as normal. EKG from outside facility which they had concern was reviewed and was not significant for STEMI or changes concerning for acute ischemia, significant for PVC. EKG completed here wo concern for acute ischemia and when compared to prior no significant changes are noted. Chart review significant for recent hospitalization (d/c 6/29) for urosepsis 2/2 to multidrug resistant bacteria. Pt completed course of meropenum as inpatient and was not discharged on outpatient abx. Infectious work up initiated and significant increasing leukocytosis 14.1 (from 10.8 6 days prior,  and 8.4 7 days prior). Lactic acid reassuring 0.8. BMP wo significant abnormalities. UA wo concern for recurrent UTI. CXR wo evidence of PNA. On reassessment patient with slightly higher temperature 99.8 from initial 99.3, but remains afebrile  Although work up reassuring, due to chronically ill patient with recent admission for sepsis now with increasing  temperature, concern for AMS from bedside nurse at [facility] and worsening leukocytosis will consult intensivists for evaluation. Case was discussed with ICU physician on call who will come evaluate patient at bedside. Pt admitted for further observation.   Final Clinical Impression(s) / ED Diagnoses Final diagnoses:  Pain  Abdominal pain    Rx / DC Orders ED Discharge Orders         Ordered    polyethylene glycol (MIRALAX / GLYCOLAX) 17 g packet  Daily     Discontinue  Reprint     07/17/19 1313    insulin aspart (NOVOLOG) 100 UNIT/ML injection  Every 4 hours     Discontinue  Reprint     07/17/19 [place], Albertus Chiarelli, MD 07/18/19 1343    Elnora Morrison, MD 07/20/19 [phone]

## 2019-07-16 NOTE — ED Triage Notes (Addendum)
PT came in via [place] from [facility]. Per [facility] they were called out d/t a change in the pt EKG. [facility] thought it was a Office manager". However, per [facility], it was just a PVC. Pt is NOT complaining of chest pain. Pt present on a ventilator, Midline to the Right, 20gLAC, and G-tube.

## 2019-07-17 ENCOUNTER — Other Ambulatory Visit: Payer: Self-pay

## 2019-07-17 ENCOUNTER — Inpatient Hospital Stay (HOSPITAL_COMMUNITY): Payer: Medicare Other

## 2019-07-17 ENCOUNTER — Encounter (HOSPITAL_COMMUNITY): Payer: Self-pay | Admitting: Pulmonary Disease

## 2019-07-17 DIAGNOSIS — J9610 Chronic respiratory failure, unspecified whether with hypoxia or hypercapnia: Secondary | ICD-10-CM

## 2019-07-17 DIAGNOSIS — R6510 Systemic inflammatory response syndrome (SIRS) of non-infectious origin without acute organ dysfunction: Secondary | ICD-10-CM

## 2019-07-17 DIAGNOSIS — R4182 Altered mental status, unspecified: Secondary | ICD-10-CM | POA: Diagnosis present

## 2019-07-17 DIAGNOSIS — Z9911 Dependence on respirator [ventilator] status: Secondary | ICD-10-CM

## 2019-07-17 LAB — BASIC METABOLIC PANEL
Anion gap: 11 (ref 5–15)
BUN: 22 mg/dL (ref 8–23)
CO2: 25 mmol/L (ref 22–32)
Calcium: 9.3 mg/dL (ref 8.9–10.3)
Chloride: 102 mmol/L (ref 98–111)
Creatinine, Ser: 0.42 mg/dL — ABNORMAL LOW (ref 0.61–1.24)
GFR calc Af Amer: >60 mL/min (ref >60)
GFR calc non Af Amer: >60 mL/min (ref >60)
Glucose, Bld: 118 mg/dL — ABNORMAL HIGH (ref 70–99)
Potassium: 4.6 mmol/L (ref 3.5–5.1)
Sodium: 138 mmol/L (ref 135–145)

## 2019-07-17 LAB — MAGNESIUM: Magnesium: 2.2 mg/dL (ref 1.7–2.4)

## 2019-07-17 LAB — CBC
HCT: 30.6 % — ABNORMAL LOW (ref 39.0–52.0)
Hemoglobin: 10.3 g/dL — ABNORMAL LOW (ref 13.0–17.0)
MCH: 24.1 pg — ABNORMAL LOW (ref 26.0–34.0)
MCHC: 33.7 g/dL (ref 30.0–36.0)
MCV: 71.5 fL — ABNORMAL LOW (ref 80.0–100.0)
Platelets: 325 10*3/uL (ref 150–400)
RBC: 4.28 MIL/uL (ref 4.22–5.81)
RDW: 16.8 % — ABNORMAL HIGH (ref 11.5–15.5)
WBC: 11.4 10*3/uL — ABNORMAL HIGH (ref 4.0–10.5)
nRBC: 0 % (ref 0.0–0.2)

## 2019-07-17 LAB — GLUCOSE, CAPILLARY
Glucose-Capillary: 127 mg/dL — ABNORMAL HIGH (ref 70–99)
Glucose-Capillary: 94 mg/dL (ref 70–99)
Glucose-Capillary: 104 mg/dL — ABNORMAL HIGH (ref 70–99)

## 2019-07-17 LAB — SARS CORONAVIRUS 2 BY RT PCR (HOSPITAL ORDER, PERFORMED IN ~~LOC~~ HOSPITAL LAB): SARS Coronavirus 2: NEGATIVE

## 2019-07-17 LAB — LACTIC ACID, PLASMA
Lactic Acid, Venous: 0.8 mmol/L (ref 0.5–1.9)
Lactic Acid, Venous: 1 mmol/L (ref 0.5–1.9)

## 2019-07-17 LAB — PHOSPHORUS: Phosphorus: 2.9 mg/dL (ref 2.5–4.6)

## 2019-07-17 MED ORDER — SCOPOLAMINE 1 MG/3DAYS TD PT72
1.0000 | MEDICATED_PATCH | TRANSDERMAL | Status: DC
  Administered 2019-07-17: 1.5 mg via TRANSDERMAL
  Filled 2019-07-17: qty 1

## 2019-07-17 MED ORDER — INSULIN DETEMIR 100 UNIT/ML ~~LOC~~ SOLN
10.0000 [IU] | Freq: Every day | SUBCUTANEOUS | Status: DC
  Filled 2019-07-17: qty 0.1

## 2019-07-17 MED ORDER — ENOXAPARIN SODIUM 40 MG/0.4ML ~~LOC~~ SOLN
40.0000 mg | Freq: Every day | SUBCUTANEOUS | Status: DC
  Administered 2019-07-17: 40 mg via SUBCUTANEOUS
  Filled 2019-07-17: qty 0.4

## 2019-07-17 MED ORDER — POLYETHYLENE GLYCOL 3350 17 G PO PACK: 17.0000 g | PACK | Freq: Every day | ORAL | 0 refills | Status: AC

## 2019-07-17 MED ORDER — OSMOLITE 1.5 CAL PO LIQD
1000.0000 mL | ORAL | Status: DC
  Administered 2019-07-17: 1000 mL
  Filled 2019-07-17: qty 1000

## 2019-07-17 MED ORDER — ONDANSETRON HCL 4 MG PO TABS: 4.0000 mg | ORAL_TABLET | Freq: Four times a day (QID) | ORAL | Status: DC | PRN

## 2019-07-17 MED ORDER — VANCOMYCIN HCL 1250 MG/250ML IV SOLN
1250.0000 mg | Freq: Once | INTRAVENOUS | Status: DC
  Filled 2019-07-17: qty 250

## 2019-07-17 MED ORDER — INSULIN ASPART 100 UNIT/ML ~~LOC~~ SOLN: 0.0000 [IU] | SUBCUTANEOUS | Status: DC

## 2019-07-17 MED ORDER — VITAL HIGH PROTEIN PO LIQD: 1000.0000 mL | ORAL | Status: DC

## 2019-07-17 MED ORDER — FAMOTIDINE 20 MG PO TABS: 20.0000 mg | ORAL_TABLET | Freq: Every day | ORAL | Status: DC

## 2019-07-17 MED ORDER — VANCOMYCIN HCL IN DEXTROSE 1-5 GM/200ML-% IV SOLN: 1000.0000 mg | INTRAVENOUS | Status: DC

## 2019-07-17 MED ORDER — IPRATROPIUM-ALBUTEROL 0.5-2.5 (3) MG/3ML IN SOLN: 3.0000 mL | Freq: Every day | RESPIRATORY_TRACT | Status: DC | PRN

## 2019-07-17 MED ORDER — SODIUM CHLORIDE 0.9 % IV SOLN
1.0000 g | Freq: Three times a day (TID) | INTRAVENOUS | Status: DC
  Administered 2019-07-17 (×2): 1 g via INTRAVENOUS
  Filled 2019-07-17 (×3): qty 1

## 2019-07-17 MED ORDER — ORAL CARE MOUTH RINSE
15.0000 mL | OROMUCOSAL | Status: DC
  Administered 2019-07-17 (×3): 15 mL via OROMUCOSAL

## 2019-07-17 MED ORDER — DOCUSATE SODIUM 50 MG/5ML PO LIQD
100.0000 mg | Freq: Two times a day (BID) | ORAL | Status: DC
  Administered 2019-07-17: 100 mg
  Filled 2019-07-17: qty 10

## 2019-07-17 MED ORDER — SERTRALINE HCL 50 MG PO TABS: 100.0000 mg | ORAL_TABLET | Freq: Every day | ORAL | Status: DC

## 2019-07-17 MED ORDER — FENTANYL 25 MCG/HR TD PT72
1.0000 | MEDICATED_PATCH | TRANSDERMAL | Status: DC
  Filled 2019-07-17: qty 1

## 2019-07-17 MED ORDER — FENTANYL CITRATE (PF) 100 MCG/2ML IJ SOLN
25.0000 ug | INTRAMUSCULAR | Status: DC | PRN
  Administered 2019-07-17: 50 ug via INTRAVENOUS
  Filled 2019-07-17: qty 2

## 2019-07-17 MED ORDER — POLYETHYLENE GLYCOL 3350 17 G PO PACK
17.0000 g | PACK | Freq: Every day | ORAL | Status: DC
  Administered 2019-07-17: 17 g via ORAL
  Filled 2019-07-17: qty 1

## 2019-07-17 MED ORDER — CHLORHEXIDINE GLUCONATE CLOTH 2 % EX PADS
6.0000 | MEDICATED_PAD | Freq: Every day | CUTANEOUS | Status: DC
  Administered 2019-07-17 (×2): 6 via TOPICAL

## 2019-07-17 MED ORDER — CLONAZEPAM 0.5 MG PO TABS
0.5000 mg | ORAL_TABLET | Freq: Three times a day (TID) | ORAL | Status: DC
  Administered 2019-07-17 (×2): 0.5 mg
  Filled 2019-07-17 (×2): qty 1

## 2019-07-17 MED ORDER — POLYETHYLENE GLYCOL 3350 17 G PO PACK: 17.0000 g | PACK | Freq: Every day | ORAL | Status: DC | PRN

## 2019-07-17 MED ORDER — DOCUSATE SODIUM 50 MG/5ML PO LIQD: 100.0000 mg | Freq: Two times a day (BID) | ORAL | Status: DC | PRN

## 2019-07-17 MED ORDER — SORBITOL 70 % SOLN
960.0000 mL | TOPICAL_OIL | Freq: Once | ORAL | Status: DC
  Filled 2019-07-17 (×2): qty 473

## 2019-07-17 MED ORDER — SIMETHICONE 80 MG PO CHEW
80.0000 mg | CHEWABLE_TABLET | Freq: Three times a day (TID) | ORAL | Status: DC | PRN
  Filled 2019-07-17: qty 1

## 2019-07-17 MED ORDER — INSULIN ASPART 100 UNIT/ML ~~LOC~~ SOLN: 0.0000 [IU] | SUBCUTANEOUS | 11 refills | Status: DC

## 2019-07-17 MED ORDER — LACTATED RINGERS IV BOLUS
500.0000 mL | Freq: Once | INTRAVENOUS | Status: AC
  Administered 2019-07-17: 500 mL via INTRAVENOUS

## 2019-07-17 MED ORDER — CHLORHEXIDINE GLUCONATE 0.12% ORAL RINSE (MEDLINE KIT)
15.0000 mL | Freq: Two times a day (BID) | OROMUCOSAL | Status: DC
  Administered 2019-07-17: 15 mL via OROMUCOSAL

## 2019-07-17 MED ORDER — GABAPENTIN 250 MG/5ML PO SOLN
300.0000 mg | Freq: Two times a day (BID) | ORAL | Status: DC
  Administered 2019-07-17: 300 mg
  Filled 2019-07-17 (×2): qty 6

## 2019-07-17 NOTE — Progress Notes (Signed)
[facility] Room 3-11a Dr. Blenda Mounts accepted

## 2019-07-17 NOTE — Progress Notes (Signed)
Assisted tele visit to patient with family member.  Emmani Lesueur M, RN  

## 2019-07-17 NOTE — Progress Notes (Signed)
Patient seen and examined. I see no evidence of infection. He has some gaseous distension of rectum on AXR. I see no change from his baseline clinical status. He is stable to return to [facility]. Will strengthen bowel regimen. DC all antibiotics and observe fever curve and WBC  Erskine Emery MD PCCM

## 2019-07-17 NOTE — Discharge Summary (Signed)
Physician Discharge Summary  Patient ID: THI SISEMORE MRN: [id number] DOB/AGE: April 28, 1950 69 y.o.  Admit date: 07/16/2019 Discharge date: 07/17/2019  Admission Diagnoses:  Discharge Diagnoses:  Active Problems:   Chronic respiratory failure (HCC)   AMS (altered mental status)   SIRS (systemic inflammatory response syndrome) (HCC)   Ventilator dependence ([place])   Discharged Condition: stable  Hospital Course:  Patient was admitted with questionable encephalopathy and dyspnea.  He was observed in ICU for over 12 hours with no signs of altered mental status or dyspnea.  He had no signs of infection.  Lactate and full panel of labs were normal.  CXR and AXR showed no actionable findings.  There was some question of EKG changes at [facility], all I see here is PACs and PVCs, stable from prior, patient had no chest pain.  Abdominal X-ray showed possible ileus.  His miralax was changed from PRN to standing.  He was making stool, could consider enema as clinically indicated at [facility].  Given clinical stability, lack of concerning physical exam findings or labs, patient determined stable to return to [facility].  Consults: None  Significant Diagnostic Studies: KUB 07/17/19 IMPRESSION: Decreased formed stool within the rectum compared to the prior CT, however, gaseous distension of rectum, visualized colon, and small-bowel persists, potentially ileus. At least radiographic follow-up of the abnormal bowel gas pattern is suggested.  CXR 07/16/19 IMPRESSION: Hazy bibasilar opacities likely combination of atelectasis and pleural fluid, unchanged from prior exam.  Treatments: Obeservation  Discharge Exam: Blood pressure 132/81, pulse 75, temperature 98.5 F (36.9 C), temperature source Oral, resp. rate 20, height 5\' 6"  (1.676 m), weight 66.7 kg, SpO2 99 %. No acute distress on vent Lungs diminished at bases Quadriplegic, mouthing words appropriately Heart irregular, ext warm  Disposition:  There  are no questions and answers to display.         Allergies as of 07/17/2019   No Known Allergies     Medication List    STOP taking these medications   ondansetron 4 MG tablet Commonly known as: ZOFRAN     TAKE these medications   acetaminophen 160 MG/5ML solution Commonly known as: TYLENOL Place 20.3 mLs (650 mg total) into feeding tube every 6 (six) hours as needed for mild pain (or Fever >/= 101).   acetaminophen 650 MG suppository Commonly known as: TYLENOL Place 1 suppository (650 mg total) rectally every 6 (six) hours as needed for mild pain (or Fever >/= 101).   acetylcysteine 20 % nebulizer solution Commonly known as: MUCOMYST Take 4 mLs by nebulization 4 (four) times daily. For 3 days.   amLODipine 10 MG tablet Commonly known as: NORVASC Place 10 mg into feeding tube daily.   chlorhexidine 0.12 % solution Commonly known as: PERIDEX Use as directed 5 mLs in the mouth or throat 2 (two) times daily.   clonazePAM 0.5 MG tablet Commonly known as: KLONOPIN Place 1 tablet (0.5 mg total) into feeding tube every 8 (eight) hours.   cloNIDine 0.1 MG tablet Commonly known as: CATAPRES Place 1 tablet (0.1 mg total) into feeding tube every 8 (eight) hours.   docusate 50 MG/5ML liquid Commonly known as: COLACE Place 10 mLs (100 mg total) into feeding tube every 12 (twelve) hours.   famotidine 20 MG tablet Commonly known as: PEPCID Place 1 tablet (20 mg total) into feeding tube at bedtime.   feeding supplement (OSMOLITE 1.5 CAL) Liqd Place 1,000 mLs into feeding tube continuous.   feeding supplement (PRO-STAT SUGAR FREE 64)  Liqd Place 30 mLs into feeding tube 3 (three) times daily.   fentaNYL 25 MCG/HR Commonly known as: [place] 1 patch onto the skin every 3 (three) days.   gabapentin 250 MG/5ML solution Commonly known as: NEURONTIN Place 6 mLs (300 mg total) into feeding tube 2 (two) times daily.   HYDROcodone-acetaminophen 7.5-325 MG  tablet Commonly known as: [place] 1 tablet into feeding tube 2 (two) times daily.   insulin aspart 100 UNIT/ML injection Commonly known as: novoLOG Inject 0-15 Units into the skin every 4 (four) hours.   insulin regular 100 units/mL injection Commonly known as: NOVOLIN R Inject 2-10 Units into the skin in the morning, at noon, in the evening, and at bedtime. Per sliding scale:  151-200 = 2 units, 201-250= 4 units, 251-300= 6 units, 301-350= 8 units, 351-400= 10 units.   ipratropium-albuterol 0.5-2.5 (3) MG/3ML Soln Commonly known as: DUONEB Take 3 mLs by nebulization every 4 (four) hours as needed (wheezing).   Levemir FlexTouch 100 UNIT/ML FlexPen Generic drug: insulin detemir Inject 10 Units into the skin at bedtime.   lisinopril 5 MG tablet Commonly known as: ZESTRIL Take 1 tablet (5 mg total) by mouth daily.   multivitamin with minerals Tabs tablet Take 1 tablet by mouth daily.   Olopatadine HCl 0.2 % Soln Place 1 drop into both eyes daily.   ondansetron 4 MG disintegrating tablet Commonly known as: ZOFRAN-ODT Take 4 mg by mouth every 6 (six) hours as needed for nausea or vomiting.   polyethylene glycol 17 g packet Commonly known as: MIRALAX / GLYCOLAX Take 17 g by mouth daily. Start taking on: July 18, 2019   scopolamine 1 MG/3DAYS Commonly known as: TRANSDERM-SCOP Place 1 patch (1.5 mg total) onto the skin every 3 (three) days.   sertraline 100 MG tablet Commonly known as: ZOLOFT Place 1 tablet (100 mg total) into feeding tube at bedtime.   simethicone 80 MG chewable tablet Commonly known as: MYLICON Place 1 tablet (80 mg total) into feeding tube every 8 (eight) hours as needed for flatulence.        Signed: Candee Furbish 07/17/2019, 1:24 PM

## 2019-07-17 NOTE — Progress Notes (Signed)
Patient transferred to 2M07 via stretcher from [place].  Patient is A&O X4. Patient is a quad but can follow commands and move hands and feet and feel pain on arms.  No family with him.  CCM tried multiple times to contact Judy(wife) and noone answered.  Patient has a PIV in [place], flushes and good drawback, NSL, and a midline in RUA, flushes with good drawback, NSL. Fentanyl patch 30mcg on RT SCV. Condom cath placed after incontinent episode. Peg tube in place and flushes fine. Patient had a lot of gas that was removed from PEG. MASD noted on sacrum and healing pressure ulcer, stage II, foam placed.  Patient also noted to have Stage II on left shin, healing. Heel protectors applied and SCD's.  #6 shiley XLT in place and pt is on the vent, 40%/5/20 TV 500.  Patient is very emotional and reports pain at his "butt".  Patient  CHG'd and [place] notified for pain medicine.  No valuables with patient. Awaiting new orders.

## 2019-07-17 NOTE — Progress Notes (Signed)
Pt headed back to [facility] via [place]. Pt. A&O x4. Admitting RN called and report given.  Report given at 1355 to [place], [place]. Two patient belonging bags sent with patient. Patient transported with a midline and one PIV that are both saline locked. Pt.'s wife, June, notified of transport.

## 2019-07-17 NOTE — Progress Notes (Signed)
Brief Nutrition Note RD working remotely.   Consult received for enteral/tube feeding initiation and management.  Adult Enteral Nutrition Protocol initiated. Full assessment to follow. Patient is from [place] and MD note from this AM states patient stable for return. Patient has PEG and per review of PTA meds, was receiving Osmolite 1.5 at unknown rate and 30 ml prostat TID.  Will order Osmolite 1.5 @ 50 ml/hr; regimen to be adjusted at time of full assessment.   Admitting Dx: Pain [R52] AMS (altered mental status) [R41.82]  Body mass index is 23.73 kg/m. Pt meets criteria for normal weight based on current BMI.  Labs:  Recent Labs  Lab 07/11/19 0843 07/11/19 0843 07/12/19 0626 07/16/19 2142 07/17/19 0422  NA 143   < > 141 138 138  K 3.9   < > 4.1 4.3 4.6  CL 111   < > 108 101 102  CO2 26   < > 24 27 25   BUN 20   < > 20 23 22   CREATININE 0.30*   < > 0.38* 0.36* 0.42*  CALCIUM 8.7*   < > 8.7* 8.9 9.3  MG 1.8  --  1.9  --  2.2  PHOS 1.9*  --  2.3*  --  2.9  GLUCOSE 146*   < > 142* 178* 118*   < > = values in this interval not displayed.      Jarome Matin, MS, RD, LDN, CNSC Inpatient Clinical Dietitian RD pager # available in [place]  After hours/weekend pager # available in [facility]

## 2019-07-17 NOTE — Progress Notes (Signed)
Pharmacy Antibiotic Note  Luis Orr is a 69 y.o. male admitted from [place] on 07/16/2019 with sepsis, recent blood culture grew MR-CoNS.  Pharmacy has been consulted for Vancomycin  dosing.  Plan: Vancomycin 1250 mg IV now, then 1000 mg IV q24h  Height: 5\' 6"  (167.6 cm) Weight: 66.7 kg (147 lb 0.8 oz) IBW/kg (Calculated) : 63.8  Temp (24hrs), Avg:99.5 F (37.5 C), Min:99.3 F (37.4 C), Max:99.8 F (37.7 C)  Recent Labs  Lab 07/11/19 0843 07/12/19 0626 07/16/19 2142 07/17/19 0146 07/17/19 0422  WBC 8.4 10.8* 14.1*  --  11.4*  CREATININE 0.30* 0.38* 0.36*  --  0.42*  LATICACIDVEN  --   --   --  0.8 1.0    Estimated Creatinine Clearance: 78.6 mL/min (A) (by C-G formula based on SCr of 0.42 mg/dL (L)).    No Known Allergies   Caryl Pina 07/17/2019 6:51 AM

## 2019-07-17 NOTE — Progress Notes (Signed)
[facility] Progress Note Patient Name: ANSEN SAYEGH DOB: [birth year] MRN: [id number]   Date of Service  07/17/2019  HPI/Events of Note  Patient needs order for PRN medications for pain.  eICU Interventions  Fentanyl 25-50 mcg Q 2 hours prn  Pain ordered.        Kerry Kass Elika Godar 07/17/2019, 5:42 AM

## 2019-07-17 NOTE — TOC Transition Note (Addendum)
Transition of Care [facility]) - CM/SW Discharge Note   Patient Details  Name: Luis Orr MRN: [id number] Date of Birth: Oct 15, 1950  Transition of Care [facility]) CM/SW Contact:  Jacquelynn Cree Phone Number: 07/17/2019, 12:36 PM   Clinical Narrative:     Patient will DC to: [facility] Family Notified: June, spouse  Transport By: Karen Chafe   Per MD patient is ready for discharge. RN, patient, and facility notified of DC. Discharge Summary faxed  to facility. RN number for report [phone] [place]. [facility] transport requested for patient.          Patient Goals and CMS Choice        Discharge Placement                       Discharge Plan and Services                                     Social Determinants of Health (SDOH) Interventions     Readmission Risk Interventions No flowsheet data found.

## 2019-07-17 NOTE — ED Notes (Signed)
Per Duwayne Heck, MD, "ICU can attempt blood draws next for cultures".

## 2019-07-17 NOTE — TOC Progression Note (Signed)
Transition of Care [facility]) - Progression Note    Patient Details  Name: Luis Orr MRN: [id number] Date of Birth: Mar 14, 1950  Transition of Care [facility]) CM/SW [place], [place] Phone Number: 07/17/2019, 12:31 PM  Clinical Narrative:     CSW spoke with [name] at [facility] and confirmed patient is able to discharge back to the facility today. CSW updated RN and contacted patient's [name] to provide the update. TOC team will continue to follow.       Expected Discharge Plan and Services                                                 Social Determinants of Health (SDOH) Interventions    Readmission Risk Interventions No flowsheet data found.

## 2019-07-17 NOTE — ED Notes (Signed)
June [place] wife [phone] would like an update on the pt

## 2019-07-17 NOTE — Care Management Obs Status (Signed)
[facility] NOTIFICATION   Patient Details  Name: Luis Orr MRN: [id number] Date of Birth: 04/08/1950   Medicare Observation Status Notification Given:  Yes    Claudie Leach, RN 07/17/2019, 2:29 PM

## 2019-07-17 NOTE — Care Management CC44 (Signed)
Condition Code 44 Documentation Completed  Patient Details  Name: Luis Orr MRN: [id number] Date of Birth: 1950/08/31   Condition Code 44 given:  Yes Patient signature on Condition Code 44 notice:  Yes Documentation of 2 MD's agreement:  Yes Code 44 added to claim:  Yes    Claudie Leach, RN 07/17/2019, 2:29 PM

## 2019-07-17 NOTE — H&P (Signed)
NAME:  PLEAS CARNEAL, MRN:  [id number], DOB:  18-Dec-1950, LOS: 0 ADMISSION DATE:  07/16/2019, CONSULTATION DATE:  07/17/19 REFERRING MD:  EDP, CHIEF COMPLAINT:  Possible sepsis   Brief History   69 y.o. M with PMH of compressive myelopathy and subsequent quadriplegia and chronic trach/vent dependence, HTN, Type 2 DM, recent admission for  Kleibsiella ESBL UTI discharged on Meropenem until 7/4 who had an episode of AMS and hypertension at [facility].  An EKG was obtained with concern for ACS, so patient sent to [facility].   PCCM consulted for concern for sepsis.   History of present illness   Daekwon Beswick is a 69 y.o. M with PMH of compressive myelopathy and subsequent quadriplegia and chronic trach/vent dependence, HTN, Type 2 DM, recent admission for  Kleibsiella ESBL UTI discharged to [facility] on Meropenem until 7/4.   Per report, pt is normally awake and follows commands, but had an episode of worsening mental status and hypertension, so EKG was obtained.  There was concern for ACS, so patient transported to the ED.  On arrival, EKG was reassuring and patient was able to deny chest pain.  Work-up revealed increasing leukocytosis of 14k, low grade fever and patient not as interactive.   CXR with atelectasis and effusions, no lactic acidosis.   UA with few bacteria   Pt had blood culture last admission growing methicillin resistant coag negative staph thought likely contamination.  Given concern for worsening sepsis and chronic vent dependence, PCCM consulted for admission   Past Medical History   has a past medical history of Anemia, Anxiety, Chronic respiratory failure ([place]), DM2 (diabetes mellitus, type 2) ([place]), Functional quadriplegia ([place]), Hypertension, and Urine retention.   Significant Hospital Events   7/3 Admit to PCCM  Consults:    Procedures:    Significant Diagnostic Tests:  7/3 CXR>>Hazy bibasilar opacities likely combination of atelectasis and pleural fluid, unchanged from  prior exam.  Micro Data:  7/4 Sars-Cov-2>>negative 7/3 UC>> 7/3 BCx2>>  Antimicrobials:  Meropenem 6/23-7/4 Vancomycin 7/4-  Interim history/subjective:  Pt still awake, seems less interactive than previously described and somewhat distraught, crying, though denies pain  Objective   Blood pressure 127/90, pulse 84, temperature 99.8 F (37.7 C), temperature source Rectal, resp. rate 20, height 5\' 6"  (1.676 m), weight 76 kg, SpO2 100 %.    Vent Mode: PRVC FiO2 (%):  [60 %] 60 % Set Rate:  [20 bmp] 20 bmp Vt Set:  [500 mL] 500 mL PEEP:  [5 cmH20] 5 cmH20 Plateau Pressure:  [13 cmH20] 13 cmH20  No intake or output data in the 24 hours ending 07/17/19 0150 Filed Weights   07/16/19 2111  Weight: 76 kg   General:  Elderly M, well nourished, no acute distress HEENT: MM pink/moist, tracheostomy Neuro: opens eyes to voice, starts crying, shakes head when asked if in pain CV: s1s2 rrr, no m/r/g PULM:  Decreased air movement bilateral bases GI: soft, bsx4 active  Extremities: warm/dry, no edema  Skin: no rashes or lesions   Resolved Hospital Problem list:  Assessment & Plan:      SIRS with recent ESBL infection   Discharged 6/29 on Meropenem -Blood cultures x2, cover with Vancomycin, complete Meropenem -500cc LR bolus    Chronic respiratory failure, trach/vent dependent  Persistent pleural effusions on CXR without clear evidence of acute infection, no hypoxia -Maintain full vent support  -titrate Vent setting to maintain SpO2 greater than or equal to 90%. -HOB elevated 30 degrees. -Plateau pressures less  than 30 cm H20.  -Follow chest x-ray, ABG prn.   -Bronchial hygiene and RT/bronchodilator protocol.     HTN -hold home Norvasc, Clonidine, Lisinopril as blood pressure is borderline in the setting of possible infection    Type 2 DM -continue home [place], SSI     Best practice:  Diet: NPO Pain/Anxiety/Delirium protocol (if indicated): continue home  fentanyl patch VAP protocol (if indicated): HOB 30 degrees DVT prophylaxis: Lovenox GI prophylaxis: pepcid Glucose control: SSI Mobility: bed rest Code Status: full code Family Communication: full code Disposition: ICU  Labs   CBC: Recent Labs  Lab 07/10/19 0245 07/11/19 0843 07/12/19 0626 07/16/19 2142  WBC 6.1 8.4 10.8* 14.1*  NEUTROABS 3.4 4.9 6.8  --   HGB 10.1* 10.6* 10.9* 10.3*  HCT 31.2* 32.4* 33.7* 31.5*  MCV 73.6* 73.3* 73.9* 71.6*  PLT 309 329 316 599    Basic Metabolic Panel: Recent Labs  Lab 07/10/19 0245 07/11/19 0843 07/12/19 0626 07/16/19 2142  NA 144 143 141 138  K 3.7 3.9 4.1 4.3  CL 111 111 108 101  CO2 23 26 24 27   GLUCOSE 189* 146* 142* 178*  BUN 22 20 20 23   CREATININE 0.35* 0.30* 0.38* 0.36*  CALCIUM 8.8* 8.7* 8.7* 8.9  MG 1.8 1.8 1.9  --   PHOS 2.2* 1.9* 2.3*  --    GFR: Estimated Creatinine Clearance: 78.6 mL/min (A) (by C-G formula based on SCr of 0.36 mg/dL (L)). Recent Labs  Lab 07/10/19 0245 07/11/19 0843 07/12/19 0626 07/16/19 2142  WBC 6.1 8.4 10.8* 14.1*    Liver Function Tests: Recent Labs  Lab 07/10/19 0245 07/11/19 0843 07/12/19 0626  AST 48* 85* 40  ALT 42 106* 74*  ALKPHOS 60 73 69  BILITOT 0.8 0.4 0.4  PROT 6.7 6.7 7.0  ALBUMIN 2.5* 2.6* 2.6*   No results for input(s): LIPASE, AMYLASE in the last 168 hours. No results for input(s): AMMONIA in the last 168 hours.  ABG    Component Value Date/Time   PHART 7.432 07/07/2019 0104   PCO2ART 39.4 07/07/2019 0104   PO2ART 94 07/07/2019 0104   HCO3 26.2 07/07/2019 0104   TCO2 27 07/07/2019 0104   O2SAT 97.0 07/07/2019 0104     Coagulation Profile: No results for input(s): INR, PROTIME in the last 168 hours.  Cardiac Enzymes: No results for input(s): CKTOTAL, CKMB, CKMBINDEX, TROPONINI in the last 168 hours.  HbA1C: Hgb A1c MFr Bld  Date/Time Value Ref Range Status  07/07/2019 04:00 AM 7.0 (H) 4.8 - 5.6 % Final    Comment:    (NOTE) Pre diabetes:           5.7%-6.4%  Diabetes:              >6.4%  Glycemic control for   <7.0% adults with diabetes     CBG: Recent Labs  Lab 07/11/19 1946 07/11/19 2341 07/12/19 0455 07/12/19 0732 07/12/19 1121  GLUCAP 124* 171* 170* 132* 101*    Review of Systems:   Unable to obtain secondary to mental status  Past Medical History  He,  has a past medical history of Anemia, Anxiety, Chronic respiratory failure ([place]), DM2 (diabetes mellitus, type 2) ([place]), Functional quadriplegia ([place]), Hypertension, and Urine retention.   Surgical History    Past Surgical History:  Procedure Laterality Date  . PEG PLACEMENT    . ROTATOR CUFF REPAIR    . TRACHEOSTOMY       Social History  Family History   His family history is not on file.   Allergies No Known Allergies   Home Medications  Prior to Admission medications   Medication Sig Start Date End Date Taking? Authorizing Provider  acetaminophen (TYLENOL) 160 MG/5ML solution Place 20.3 mLs (650 mg total) into feeding tube every 6 (six) hours as needed for mild pain (or Fever >/= 101). 07/12/19   Allie Bossier, MD  acetaminophen (TYLENOL) 650 MG suppository Place 1 suppository (650 mg total) rectally every 6 (six) hours as needed for mild pain (or Fever >/= 101). 07/12/19   Allie Bossier, MD  Amino Acids-Protein Hydrolys (FEEDING SUPPLEMENT, PRO-STAT SUGAR FREE 64,) LIQD Place 30 mLs into feeding tube 3 (three) times daily. 07/12/19   Allie Bossier, MD  amLODipine (NORVASC) 10 MG tablet Take 10 mg by mouth daily.    [provider]  chlorhexidine (PERIDEX) 0.12 % solution Use as directed 5 mLs in the mouth or throat 2 (two) times daily.    [provider]  clonazePAM (KLONOPIN) 0.5 MG tablet Place 1 tablet (0.5 mg total) into feeding tube every 8 (eight) hours. 07/12/19   Allie Bossier, MD  cloNIDine (CATAPRES) 0.1 MG tablet Place 1 tablet (0.1 mg total) into feeding tube every 8 (eight) hours. 07/12/19   Allie Bossier, MD  docusate (COLACE) 50 MG/5ML liquid Place 10 mLs (100 mg total) into feeding tube every 12 (twelve) hours. 07/12/19   Allie Bossier, MD  famotidine (PEPCID) 20 MG tablet Place 1 tablet (20 mg total) into feeding tube at bedtime. 07/12/19   Allie Bossier, MD  fentaNYL (DURAGESIC) 25 MCG/HR Place 1 patch onto the skin every 3 (three) days. 07/14/19   Allie Bossier, MD  gabapentin (NEURONTIN) 250 MG/5ML solution Place 6 mLs (300 mg total) into feeding tube 2 (two) times daily. 07/12/19   Allie Bossier, MD  HYDROcodone-acetaminophen (NORCO) 7.5-325 MG tablet Place 1 tablet into feeding tube 2 (two) times daily. 07/12/19   Allie Bossier, MD  insulin detemir (LEVEMIR) 100 UNIT/ML injection Inject 10 Units into the skin at bedtime.    [provider]  insulin regular (NOVOLIN R) 100 units/mL injection Inject 2-10 Units into the skin in the morning, at noon, in the evening, and at bedtime. Per sliding scale:  151-200 = 2 units, 201-250= 4 units, 251-300= 6 units, 301-350= 8 units, 351-400= 10 units.    [provider]  ipratropium-albuterol (DUONEB) 0.5-2.5 (3) MG/3ML SOLN Take 3 mLs by nebulization daily as needed (wheezing).     [provider]  lisinopril (ZESTRIL) 5 MG tablet Take 1 tablet (5 mg total) by mouth daily. 07/13/19   Allie Bossier, MD  Multiple Vitamin (MULTIVITAMIN WITH MINERALS) TABS tablet Take 1 tablet by mouth daily. 07/13/19   Allie Bossier, MD  Nutritional Supplements (FEEDING SUPPLEMENT, OSMOLITE 1.5 CAL,) LIQD Place 1,000 mLs into feeding tube continuous. 07/12/19   Allie Bossier, MD  Olopatadine HCl 0.2 % SOLN Place 1 drop into both eyes daily.    [provider]  ondansetron (ZOFRAN) 4 MG tablet Take 1 tablet (4 mg total) by mouth every 6 (six) hours as needed for nausea. 07/12/19   Allie Bossier, MD  polyethylene glycol (MIRALAX / GLYCOLAX) 17 g packet Take 17 g by mouth daily as needed for mild constipation. 07/12/19   Allie Bossier, MD  scopolamine (TRANSDERM-SCOP) 1 MG/3DAYS Place 1 patch (1.5 mg total) onto  the skin every 3 (three) days. 07/12/19   Allie Bossier, MD  sertraline (ZOLOFT) 100 MG tablet Place 1 tablet (100 mg total) into feeding tube at bedtime. 07/12/19   Allie Bossier, MD  simethicone (MYLICON) 80 MG chewable tablet Place 1 tablet (80 mg total) into feeding tube every 8 (eight) hours as needed for flatulence. 07/12/19   Allie Bossier, MD     Critical care time: 50 minutes       CRITICAL CARE Performed by: Otilio Carpen Alon Mazor   Total critical care time: 50 minutes  Critical care time was exclusive of separately billable procedures and treating other patients.  Critical care was necessary to treat or prevent imminent or life-threatening deterioration.  Critical care was time spent personally by me on the following activities: development of treatment plan with patient and/or surrogate as well as nursing, discussions with consultants, evaluation of patient's response to treatment, examination of patient, obtaining history from patient or surrogate, ordering and performing treatments and interventions, ordering and review of laboratory studies, ordering and review of radiographic studies, pulse oximetry and re-evaluation of patient's condition.   Otilio Carpen Saidi Santacroce, PA-C

## 2019-07-20 LAB — URINE CULTURE: Culture: >=100000 — AB

## 2019-07-20 LAB — CULTURE, BLOOD (ROUTINE X 2)

## 2019-07-22 LAB — CULTURE, BLOOD (ROUTINE X 2): Culture: NO GROWTH

## 2019-10-07 ENCOUNTER — Emergency Department (HOSPITAL_COMMUNITY): Payer: Medicare Other

## 2019-10-07 ENCOUNTER — Inpatient Hospital Stay (HOSPITAL_COMMUNITY)
Admission: EM | Admit: 2019-10-07 | Discharge: 2019-10-11 | DRG: 659 | Disposition: A | Discharge status: Skilled Nursing Facility | Payer: Medicare Other | Source: Skilled Nursing Facility | Attending: Internal Medicine | Admitting: Internal Medicine

## 2019-10-07 DIAGNOSIS — J9621 Acute and chronic respiratory failure with hypoxia: Secondary | ICD-10-CM | POA: Diagnosis not present

## 2019-10-07 DIAGNOSIS — I4891 Unspecified atrial fibrillation: Secondary | ICD-10-CM | POA: Diagnosis present

## 2019-10-07 DIAGNOSIS — E871 Hypo-osmolality and hyponatremia: Secondary | ICD-10-CM | POA: Diagnosis present

## 2019-10-07 DIAGNOSIS — Z794 Long term (current) use of insulin: Secondary | ICD-10-CM

## 2019-10-07 DIAGNOSIS — K567 Ileus, unspecified: Secondary | ICD-10-CM | POA: Diagnosis present

## 2019-10-07 DIAGNOSIS — Z8619 Personal history of other infectious and parasitic diseases: Secondary | ICD-10-CM | POA: Diagnosis not present

## 2019-10-07 DIAGNOSIS — M21371 Foot drop, right foot: Secondary | ICD-10-CM | POA: Diagnosis present

## 2019-10-07 DIAGNOSIS — J9610 Chronic respiratory failure, unspecified whether with hypoxia or hypercapnia: Secondary | ICD-10-CM | POA: Diagnosis not present

## 2019-10-07 DIAGNOSIS — T83511A Infection and inflammatory reaction due to indwelling urethral catheter, initial encounter: Principal | ICD-10-CM | POA: Diagnosis present

## 2019-10-07 DIAGNOSIS — M21372 Foot drop, left foot: Secondary | ICD-10-CM | POA: Diagnosis present

## 2019-10-07 DIAGNOSIS — N390 Urinary tract infection, site not specified: Secondary | ICD-10-CM

## 2019-10-07 DIAGNOSIS — I10 Essential (primary) hypertension: Secondary | ICD-10-CM

## 2019-10-07 DIAGNOSIS — I482 Chronic atrial fibrillation, unspecified: Secondary | ICD-10-CM | POA: Diagnosis not present

## 2019-10-07 DIAGNOSIS — Z8614 Personal history of Methicillin resistant Staphylococcus aureus infection: Secondary | ICD-10-CM | POA: Diagnosis not present

## 2019-10-07 DIAGNOSIS — N201 Calculus of ureter: Secondary | ICD-10-CM

## 2019-10-07 DIAGNOSIS — Z9911 Dependence on respirator [ventilator] status: Secondary | ICD-10-CM

## 2019-10-07 DIAGNOSIS — R532 Functional quadriplegia: Secondary | ICD-10-CM | POA: Diagnosis present

## 2019-10-07 DIAGNOSIS — R6521 Severe sepsis with septic shock: Secondary | ICD-10-CM | POA: Diagnosis not present

## 2019-10-07 DIAGNOSIS — Y846 Urinary catheterization as the cause of abnormal reaction of the patient, or of later complication, without mention of misadventure at the time of the procedure: Secondary | ICD-10-CM | POA: Diagnosis present

## 2019-10-07 DIAGNOSIS — N136 Pyonephrosis: Secondary | ICD-10-CM | POA: Diagnosis present

## 2019-10-07 DIAGNOSIS — G9341 Metabolic encephalopathy: Secondary | ICD-10-CM | POA: Diagnosis present

## 2019-10-07 DIAGNOSIS — G8929 Other chronic pain: Secondary | ICD-10-CM | POA: Diagnosis present

## 2019-10-07 DIAGNOSIS — R6520 Severe sepsis without septic shock: Secondary | ICD-10-CM | POA: Diagnosis present

## 2019-10-07 DIAGNOSIS — J9611 Chronic respiratory failure with hypoxia: Secondary | ICD-10-CM | POA: Diagnosis present

## 2019-10-07 DIAGNOSIS — E1100 Type 2 diabetes mellitus with hyperosmolarity without nonketotic hyperglycemic-hyperosmolar coma (NKHHC): Secondary | ICD-10-CM | POA: Diagnosis not present

## 2019-10-07 DIAGNOSIS — A419 Sepsis, unspecified organism: Secondary | ICD-10-CM | POA: Diagnosis not present

## 2019-10-07 DIAGNOSIS — E1165 Type 2 diabetes mellitus with hyperglycemia: Secondary | ICD-10-CM | POA: Diagnosis present

## 2019-10-07 DIAGNOSIS — Z930 Tracheostomy status: Secondary | ICD-10-CM | POA: Diagnosis not present

## 2019-10-07 DIAGNOSIS — E119 Type 2 diabetes mellitus without complications: Secondary | ICD-10-CM

## 2019-10-07 DIAGNOSIS — R4182 Altered mental status, unspecified: Secondary | ICD-10-CM | POA: Diagnosis not present

## 2019-10-07 DIAGNOSIS — E11649 Type 2 diabetes mellitus with hypoglycemia without coma: Secondary | ICD-10-CM | POA: Diagnosis not present

## 2019-10-07 DIAGNOSIS — K560 Paralytic ileus: Secondary | ICD-10-CM

## 2019-10-07 DIAGNOSIS — Z79899 Other long term (current) drug therapy: Secondary | ICD-10-CM

## 2019-10-07 DIAGNOSIS — L89152 Pressure ulcer of sacral region, stage 2: Secondary | ICD-10-CM | POA: Diagnosis present

## 2019-10-07 DIAGNOSIS — A4159 Other Gram-negative sepsis: Secondary | ICD-10-CM | POA: Diagnosis present

## 2019-10-07 DIAGNOSIS — L89159 Pressure ulcer of sacral region, unspecified stage: Secondary | ICD-10-CM | POA: Diagnosis not present

## 2019-10-07 DIAGNOSIS — Z20822 Contact with and (suspected) exposure to covid-19: Secondary | ICD-10-CM | POA: Diagnosis present

## 2019-10-07 DIAGNOSIS — F419 Anxiety disorder, unspecified: Secondary | ICD-10-CM | POA: Diagnosis present

## 2019-10-07 LAB — LIPASE, BLOOD: Lipase: 27 U/L (ref 11–51)

## 2019-10-07 LAB — CBC WITH DIFFERENTIAL/PLATELET
Abs Immature Granulocytes: 0.11 10*3/uL — ABNORMAL HIGH (ref 0.00–0.07)
Basophils Absolute: 0.1 10*3/uL (ref 0.0–0.1)
Basophils Relative: 0 %
Eosinophils Absolute: 0.1 10*3/uL (ref 0.0–0.5)
Eosinophils Relative: 0 %
HCT: 36 % — ABNORMAL LOW (ref 39.0–52.0)
Hemoglobin: 12.1 g/dL — ABNORMAL LOW (ref 13.0–17.0)
Immature Granulocytes: 1 %
Lymphocytes Relative: 15 %
Lymphs Abs: 3 10*3/uL (ref 0.7–4.0)
MCH: 24.4 pg — ABNORMAL LOW (ref 26.0–34.0)
MCHC: 33.6 g/dL (ref 30.0–36.0)
MCV: 72.7 fL — ABNORMAL LOW (ref 80.0–100.0)
Monocytes Absolute: 2.3 10*3/uL — ABNORMAL HIGH (ref 0.1–1.0)
Monocytes Relative: 12 %
Neutro Abs: 13.7 10*3/uL — ABNORMAL HIGH (ref 1.7–7.7)
Neutrophils Relative %: 72 %
Platelets: 234 10*3/uL (ref 150–400)
RBC: 4.95 MIL/uL (ref 4.22–5.81)
RDW: 15.9 % — ABNORMAL HIGH (ref 11.5–15.5)
WBC: 19.2 10*3/uL — ABNORMAL HIGH (ref 4.0–10.5)
nRBC: 0 % (ref 0.0–0.2)

## 2019-10-07 LAB — URINALYSIS, ROUTINE W REFLEX MICROSCOPIC
Bilirubin Urine: NEGATIVE
Budding Yeast: NONE SEEN
Glucose, UA: NEGATIVE mg/dL
Ketones, ur: NEGATIVE mg/dL
Nitrite: NEGATIVE
Protein, ur: 100 mg/dL — AB
Specific Gravity, Urine: 1.015 (ref 1.005–1.030)
WBC, UA: >50 WBC/hpf — ABNORMAL HIGH (ref 0–5)
pH: 5 (ref 5.0–8.0)

## 2019-10-07 LAB — COMPREHENSIVE METABOLIC PANEL
ALT: 15 U/L (ref 0–44)
AST: 13 U/L — ABNORMAL LOW (ref 15–41)
Albumin: 2.8 g/dL — ABNORMAL LOW (ref 3.5–5.0)
Alkaline Phosphatase: 67 U/L (ref 38–126)
Anion gap: 11 (ref 5–15)
BUN: 25 mg/dL — ABNORMAL HIGH (ref 8–23)
CO2: 15 mmol/L — ABNORMAL LOW (ref 22–32)
Calcium: 8.4 mg/dL — ABNORMAL LOW (ref 8.9–10.3)
Chloride: 107 mmol/L (ref 98–111)
Creatinine, Ser: 0.87 mg/dL (ref 0.61–1.24)
GFR calc Af Amer: >60 mL/min (ref >60)
GFR calc non Af Amer: >60 mL/min (ref >60)
Glucose, Bld: 155 mg/dL — ABNORMAL HIGH (ref 70–99)
Potassium: 3.1 mmol/L — ABNORMAL LOW (ref 3.5–5.1)
Sodium: 133 mmol/L — ABNORMAL LOW (ref 135–145)
Total Bilirubin: 1.1 mg/dL (ref 0.3–1.2)
Total Protein: 7.1 g/dL (ref 6.5–8.1)

## 2019-10-07 LAB — LACTIC ACID, PLASMA: Lactic Acid, Venous: 1.2 mmol/L (ref 0.5–1.9)

## 2019-10-07 LAB — RESPIRATORY PANEL BY RT PCR (FLU A&B, COVID)
Influenza A by PCR: NEGATIVE
Influenza B by PCR: NEGATIVE
SARS Coronavirus 2 by RT PCR: NEGATIVE

## 2019-10-07 LAB — CBG MONITORING, ED: Glucose-Capillary: 77 mg/dL (ref 70–99)

## 2019-10-07 MED ORDER — MORPHINE SULFATE (PF) 2 MG/ML IV SOLN
2.0000 mg | INTRAVENOUS | Status: DC | PRN
  Administered 2019-10-09: 2 mg via INTRAVENOUS
  Administered 2019-10-09 – 2019-10-10 (×2): 4 mg via INTRAVENOUS
  Filled 2019-10-07 (×2): qty 2
  Filled 2019-10-07: qty 1

## 2019-10-07 MED ORDER — LACTATED RINGERS IV SOLN: INTRAVENOUS | Status: DC

## 2019-10-07 MED ORDER — IOHEXOL 300 MG/ML  SOLN
100.0000 mL | Freq: Once | INTRAMUSCULAR | Status: AC | PRN
  Administered 2019-10-07: 100 mL via INTRAVENOUS

## 2019-10-07 MED ORDER — LACTATED RINGERS IV BOLUS (SEPSIS)
1000.0000 mL | Freq: Once | INTRAVENOUS | Status: AC
  Administered 2019-10-07: 1000 mL via INTRAVENOUS

## 2019-10-07 MED ORDER — OLOPATADINE HCL 0.1 % OP SOLN
1.0000 [drp] | Freq: Two times a day (BID) | OPHTHALMIC | Status: DC
  Administered 2019-10-08 – 2019-10-11 (×6): 1 [drp] via OPHTHALMIC
  Filled 2019-10-07: qty 5

## 2019-10-07 MED ORDER — LACTATED RINGERS IV BOLUS (SEPSIS)
250.0000 mL | Freq: Once | INTRAVENOUS | Status: AC
  Administered 2019-10-07: 250 mL via INTRAVENOUS

## 2019-10-07 MED ORDER — VANCOMYCIN HCL 1250 MG/250ML IV SOLN
1250.0000 mg | Freq: Once | INTRAVENOUS | Status: AC
  Administered 2019-10-07: 1250 mg via INTRAVENOUS
  Filled 2019-10-07: qty 250

## 2019-10-07 MED ORDER — SODIUM CHLORIDE 0.9 % IV SOLN
2.0000 g | Freq: Three times a day (TID) | INTRAVENOUS | Status: DC
  Administered 2019-10-07 – 2019-10-11 (×12): 2 g via INTRAVENOUS
  Filled 2019-10-07 (×15): qty 2

## 2019-10-07 MED ORDER — HEPARIN BOLUS VIA INFUSION
2000.0000 [IU] | Freq: Once | INTRAVENOUS | Status: AC
  Administered 2019-10-07: 2000 [IU] via INTRAVENOUS
  Filled 2019-10-07: qty 2000

## 2019-10-07 MED ORDER — LORAZEPAM 2 MG/ML IJ SOLN: 0.5000 mg | Freq: Four times a day (QID) | INTRAMUSCULAR | Status: DC | PRN

## 2019-10-07 MED ORDER — INSULIN ASPART 100 UNIT/ML ~~LOC~~ SOLN
0.0000 [IU] | SUBCUTANEOUS | Status: DC
  Administered 2019-10-08: 3 [IU] via SUBCUTANEOUS
  Administered 2019-10-08: 1 [IU] via SUBCUTANEOUS
  Administered 2019-10-09: 2 [IU] via SUBCUTANEOUS
  Administered 2019-10-09: 5 [IU] via SUBCUTANEOUS
  Administered 2019-10-09: 2 [IU] via SUBCUTANEOUS
  Administered 2019-10-09: 5 [IU] via SUBCUTANEOUS
  Administered 2019-10-09 – 2019-10-10 (×2): 2 [IU] via SUBCUTANEOUS
  Administered 2019-10-11: 1 [IU] via SUBCUTANEOUS

## 2019-10-07 MED ORDER — POTASSIUM CHLORIDE 10 MEQ/100ML IV SOLN
10.0000 meq | INTRAVENOUS | Status: AC
  Administered 2019-10-07 – 2019-10-08 (×4): 10 meq via INTRAVENOUS
  Filled 2019-10-07 (×3): qty 100

## 2019-10-07 MED ORDER — ONDANSETRON HCL 4 MG/2ML IJ SOLN: 4.0000 mg | Freq: Four times a day (QID) | INTRAMUSCULAR | Status: DC | PRN

## 2019-10-07 MED ORDER — ONDANSETRON HCL 4 MG PO TABS: 4.0000 mg | ORAL_TABLET | Freq: Four times a day (QID) | ORAL | Status: DC | PRN

## 2019-10-07 MED ORDER — PIPERACILLIN-TAZOBACTAM 3.375 G IVPB 30 MIN
3.3750 g | Freq: Once | INTRAVENOUS | Status: AC
  Administered 2019-10-07: 3.375 g via INTRAVENOUS
  Filled 2019-10-07: qty 50

## 2019-10-07 MED ORDER — SCOPOLAMINE 1 MG/3DAYS TD PT72
1.0000 | MEDICATED_PATCH | TRANSDERMAL | Status: DC
  Administered 2019-10-08: 1.5 mg via TRANSDERMAL
  Filled 2019-10-07: qty 1

## 2019-10-07 MED ORDER — ACETAMINOPHEN 650 MG RE SUPP
650.0000 mg | Freq: Four times a day (QID) | RECTAL | Status: DC | PRN
  Administered 2019-10-08: 650 mg via RECTAL
  Filled 2019-10-07: qty 1

## 2019-10-07 MED ORDER — DILTIAZEM HCL-DEXTROSE 125-5 MG/125ML-% IV SOLN (PREMIX)
5.0000 mg/h | INTRAVENOUS | Status: DC
  Administered 2019-10-07: 5 mg/h via INTRAVENOUS
  Administered 2019-10-08: 15 mg/h via INTRAVENOUS
  Administered 2019-10-08: 15 mg via INTRAVENOUS
  Administered 2019-10-08 – 2019-10-11 (×9): 15 mg/h via INTRAVENOUS
  Filled 2019-10-07 (×13): qty 125

## 2019-10-07 MED ORDER — IPRATROPIUM-ALBUTEROL 0.5-2.5 (3) MG/3ML IN SOLN: 3.0000 mL | RESPIRATORY_TRACT | Status: DC | PRN

## 2019-10-07 MED ORDER — DILTIAZEM LOAD VIA INFUSION
15.0000 mg | Freq: Once | INTRAVENOUS | Status: AC
  Administered 2019-10-07: 15 mg via INTRAVENOUS
  Filled 2019-10-07: qty 15

## 2019-10-07 MED ORDER — ACETAMINOPHEN 325 MG PO TABS: 650.0000 mg | ORAL_TABLET | Freq: Four times a day (QID) | ORAL | Status: DC | PRN

## 2019-10-07 MED ORDER — VANCOMYCIN HCL IN DEXTROSE 1-5 GM/200ML-% IV SOLN
1000.0000 mg | INTRAVENOUS | Status: DC
  Administered 2019-10-08 – 2019-10-09 (×2): 1000 mg via INTRAVENOUS
  Filled 2019-10-07 (×2): qty 200

## 2019-10-07 MED ORDER — ENOXAPARIN SODIUM 40 MG/0.4ML ~~LOC~~ SOLN
40.0000 mg | SUBCUTANEOUS | Status: DC
  Administered 2019-10-07: 40 mg via SUBCUTANEOUS
  Filled 2019-10-07: qty 0.4

## 2019-10-07 MED ORDER — SODIUM CHLORIDE 0.9 % IV BOLUS
250.0000 mL | Freq: Once | INTRAVENOUS | Status: AC
  Administered 2019-10-07: 250 mL via INTRAVENOUS

## 2019-10-07 MED ORDER — PIPERACILLIN-TAZOBACTAM 3.375 G IVPB: 3.3750 g | Freq: Three times a day (TID) | INTRAVENOUS | Status: DC

## 2019-10-07 MED ORDER — HEPARIN (PORCINE) 25000 UT/250ML-% IV SOLN
1300.0000 [IU]/h | INTRAVENOUS | Status: DC
  Administered 2019-10-07: 1000 [IU]/h via INTRAVENOUS
  Administered 2019-10-08: 1050 [IU]/h via INTRAVENOUS
  Administered 2019-10-09 – 2019-10-10 (×2): 1300 [IU]/h via INTRAVENOUS
  Filled 2019-10-07 (×3): qty 250

## 2019-10-07 MED ORDER — CHLORHEXIDINE GLUCONATE 0.12 % MT SOLN
5.0000 mL | Freq: Two times a day (BID) | OROMUCOSAL | Status: DC
  Administered 2019-10-08 – 2019-10-10 (×5): 5 mL via OROMUCOSAL
  Filled 2019-10-07 (×5): qty 15

## 2019-10-07 NOTE — Progress Notes (Signed)
[place] for Heparin Indication: atrial fibrillation  Allergies  Allergen Reactions  . Adhesive [Tape] Rash    Patient Measurements: Height: 5\' 6"  (167.6 cm) Weight: 66.8 kg (147 lb 4.3 oz) IBW/kg (Calculated) : 63.8 Heparin Dosing Weight: 66.8 kg  Vital Signs: Temp: 100.7 F (38.2 C) (09/24 1337) Temp Source: Rectal (09/24 1337) BP: 144/89 (09/24 2028) Pulse Rate: 56 (09/24 2028)  Labs: Recent Labs    10/07/19 1425  HGB 12.1*  HCT 36.0*  PLT 234  CREATININE 0.87    Estimated Creatinine Clearance: 72.3 mL/min (by C-G formula based on SCr of 0.87 mg/dL).   Medical History: Past Medical History:  Diagnosis Date  . Anemia   . Anxiety   . Chronic respiratory failure ([place])   . DM2 (diabetes mellitus, type 2) ([place])   . Functional quadriplegia ([facility])   . Hypertension   . Urine retention     Medications:  Scheduled:  . chlorhexidine  5 mL Mouth Rinse Q12H  . diltiazem  15 mg Intravenous Once  . heparin  2,000 Units Intravenous Once  . insulin aspart  0-9 Units Subcutaneous Q4H  . olopatadine  1 drop Both Eyes BID  . scopolamine  1 patch Transdermal Q72H    Assessment: Patient is a 55 yom that was being admitted for sepsis. Patient went into new onset afib with RVR. Pharmacy has been asked to dose heparin at this time.   Goal of Therapy:  Heparin level 0.3-0.7 units/ml Monitor platelets by anticoagulation protocol: Yes   Plan:  - Heparin bolus 2000 untis IV x 1 dose (revieved ppx Lovenox dose ~ 2 hr ago)  - Heparin drip @ 1000 units/hr - Heparin level in ~ 6 hours  - Monitor patient for s/s of bleeding and CBC while on heparin   Duanne Limerick PharmD. BCPS  10/07/2019,11:04 PM

## 2019-10-07 NOTE — Consult Note (Signed)
NAME:  Luis Orr, MRN:  [id number], DOB:  13-May-1950, LOS: 0 ADMISSION DATE:  10/07/2019, CONSULTATION DATE: 10/07/2019 REFERRING MD: Dr. 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  Patient is a resident of [facility].  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  Patient is a resident of [facility].  Patient presented to the emergency department with distended abdomen.  Also has a history of recurrent ESBL Klebsiella pneumoniae UTIs in June and July.  Also history of MRSA bacteremia in June.  Upon presentation to the emergency department patient found to be febrile with a temperature of 100.7, white blood cell count 19,000 initial blood pressure systolic in the 76E.  Patient was given 2.5 L of lactated Ringer's with improvement in his blood pressure into the 140s.  Urinalysis revealed blood, greater than 50 white blood cells and many bacteria.  CT scan of the abdomen revealed distended colon chronic right ureteral junction obstruction, atrophy of the right kidney.  Which is chronic.  Pulmonary was consulted for recommendations and ventilator management as he is chronically on the vent.  Past Medical History   Past Medical History:  Diagnosis Date  . Anemia   . Anxiety   . Chronic respiratory failure ([place])   . DM2 (diabetes mellitus, type 2) ([place])   . Functional quadriplegia ([facility])   . Hypertension   . Urine retention      Significant Hospital Events     Consults:  Pulmonary critical care Surgery  Procedures:    Significant Diagnostic Tests:    Micro Data:  COVID-19 negative Prior  history of ESBL Klebsiella, history of MRSA  Antimicrobials:  Meropenem plus vancomycin  Interim history/subjective:  HPI above.  Obtained from the chart.  Patient is noncommunicative  Objective   Blood pressure (!) 144/89, pulse (!) 56, temperature (!) 100.7 F (38.2 C), temperature source Rectal, resp. rate 19, height 5\' 6"  (1.676 m), weight 66.8 kg, SpO2 98 %.    Vent Mode: PRVC FiO2 (%):  [40 %] 40 % Set Rate:  [12 bmp] 12 bmp Vt Set:  [500 mL] 500 mL PEEP:  [5 cmH20] Beatty Pressure:  [19 cmH20-20 cmH20] 20 cmH20  No intake or output data in the 24 hours ending 10/07/19 2219 Filed Weights   10/07/19 1400  Weight: 66.8 kg    Examination: General: Chronically ill debilitated gentleman tracheostomy tube, PEG placement, on mechanical ventilator support HENT: NCAT, tracking appropriately, tracheostomy tube in place no significant secretions Lungs: Bilateral mechanically ventilated breath sounds Cardiovascular: Regular rate rhythm, S1-S2 Abdomen: Abdomen severely distended, chronic skin hyperpigmentation and changes around PEG insertion site no significant drainage Extremities: Bilateral lower extremity dependent edema and bilateral ankle foot drop. Neuro: Grimaces to painful stimuli GU: Deferred, Foley in place  Resolved Hospital Problem list     Assessment & Plan:   Chronic hypoxemic respiratory failure requiring tracheostomy tube and chronic mechanical ventilatory support. High cervical, C3 C5 compression, functional quadriplegic Plan: Ventilator settings reviewed. Vent Mode: PRVC FiO2 (%):  [40 %] 40 % Set Rate:  [12 bmp] 12 bmp Vt Set:  [500 mL] 500 mL PEEP:  [5 cmH20] 5 cmH20 Plateau Pressure:  [19 cmH20-20 cmH20] 20 cmH20 Appears stable with  current settings. Chest x-rays as needed. ABG as needed Wean FiO2 and PEEP to maintain SPO2 greater than 90%. Pulmonary will follow for vent management  Sepsis Hyponatremia Likely urinary tract  infection Plan: Per admitting service. Agree with broad-spectrum antimicrobial, ESBL coverage to include meropenem plus vancomycin Cultures pending Status post lactated Ringer's, 2.5 L. Continue to observe blood pressure.  Goal mean arterial pressure greater than 65 mmHg.  Abdominal distention Adynamic ileus Plan: Surgery consulted. May need decompression by GI at some point.  Type 2 diabetes Hypertension Chronic pain   Labs   CBC: Recent Labs  Lab 10/07/19 1425  WBC 19.2*  NEUTROABS 13.7*  HGB 12.1*  HCT 36.0*  MCV 72.7*  PLT 034    Basic Metabolic Panel: Recent Labs  Lab 10/07/19 1425  NA 133*  K 3.1*  CL 107  CO2 15*  GLUCOSE 155*  BUN 25*  CREATININE 0.87  CALCIUM 8.4*   GFR: Estimated Creatinine Clearance: 72.3 mL/min (by C-G formula based on SCr of 0.87 mg/dL). Recent Labs  Lab 10/07/19 1425  WBC 19.2*  LATICACIDVEN 1.2    Liver Function Tests: Recent Labs  Lab 10/07/19 1425  AST 13*  ALT 15  ALKPHOS 67  BILITOT 1.1  PROT 7.1  ALBUMIN 2.8*   Recent Labs  Lab 10/07/19 1425  LIPASE 27   No results for input(s): AMMONIA in the last 168 hours.  ABG    Component Value Date/Time   PHART 7.432 07/07/2019 0104   PCO2ART 39.4 07/07/2019 0104   PO2ART 94 07/07/2019 0104   HCO3 26.2 07/07/2019 0104   TCO2 27 07/07/2019 0104   O2SAT 97.0 07/07/2019 0104     Coagulation Profile: No results for input(s): INR, PROTIME in the last 168 hours.  Cardiac Enzymes: No results for input(s): CKTOTAL, CKMB, CKMBINDEX, TROPONINI in the last 168 hours.  HbA1C: Hgb A1c MFr Bld  Date/Time Value Ref Range Status  07/07/2019 04:00 AM 7.0 (H) 4.8 - 5.6 % Final    Comment:    (NOTE) Pre diabetes:          5.7%-6.4%  Diabetes:              >6.4%  Glycemic control for   <7.0% adults with diabetes     CBG: Recent Labs  Lab 10/07/19 2026  GLUCAP 77    Review of Systems:   Unable to obtain.  Patient noncommunicative.  Past Medical  History  He,  has a past medical history of Anemia, Anxiety, Chronic respiratory failure ([place]), DM2 (diabetes mellitus, type 2) ([place]), Functional quadriplegia ([place]), Hypertension, and Urine retention.   Surgical History    Past Surgical History:  Procedure Laterality Date  . PEG PLACEMENT    . ROTATOR CUFF REPAIR    . TRACHEOSTOMY       Social History   reports that he has never smoked. He has never used smokeless tobacco.   Family History   His family history is not on file.   Allergies Allergies  Allergen Reactions  . Adhesive [Tape] Rash     Home Medications  Prior to Admission medications   Medication Sig Start Date End Date Taking? Authorizing Provider  acetaminophen (TYLENOL) 160 MG/5ML solution Place 20.3 mLs (650 mg total) into feeding tube every 6 (six) hours as needed for mild pain (or Fever >/= 101). 07/12/19  Yes Allie Bossier, MD  acetaminophen (TYLENOL) 650 MG suppository Place 1 suppository (650 mg total) rectally every 6 (six) hours as  needed for mild pain (or Fever >/= 101). 07/12/19  Yes Allie Bossier, MD  amLODipine (NORVASC) 10 MG tablet Place 10 mg into feeding tube daily.    Yes [provider]  chlorhexidine (PERIDEX) 0.12 % solution 5 mLs by Mouth Rinse route every 12 (twelve) hours.    Yes [provider]  clonazePAM (KLONOPIN) 0.5 MG tablet Place 1 tablet (0.5 mg total) into feeding tube every 8 (eight) hours. 07/12/19  Yes Allie Bossier, MD  cloNIDine (CATAPRES) 0.1 MG tablet Place 1 tablet (0.1 mg total) into feeding tube every 8 (eight) hours. 07/12/19  Yes Allie Bossier, MD  docusate (COLACE) 50 MG/5ML liquid Place 10 mLs (100 mg total) into feeding tube every 12 (twelve) hours. 07/12/19  Yes Allie Bossier, MD  famotidine (PEPCID) 20 MG tablet Place 1 tablet (20 mg total) into feeding tube at bedtime. Patient taking differently: Place 20 mg into feeding tube 2 (two) times daily.  07/12/19  Yes Allie Bossier, MD  fentaNYL  (DURAGESIC) 25 MCG/HR Place 1 patch onto the skin every 3 (three) days. 07/14/19  Yes Allie Bossier, MD  gabapentin (NEURONTIN) 250 MG/5ML solution Place 6 mLs (300 mg total) into feeding tube 2 (two) times daily. 07/12/19  Yes Allie Bossier, MD  HYDROcodone-acetaminophen (NORCO) 7.5-325 MG tablet Place 1 tablet into feeding tube 2 (two) times daily. 07/12/19  Yes Allie Bossier, MD  insulin regular (NOVOLIN R) 100 units/mL injection Inject 2-10 Units into the skin See admin instructions. Inject 2-10 units into the skin every 6 hours, per sliding scale: BGL 151-200 = 2 units, 201-250= 4 units, 251-300= 6 units, 301-350= 8 units, 351-400 = 10 units and call MD if <60 or >400   Yes [provider]  ipratropium-albuterol (DUONEB) 0.5-2.5 (3) MG/3ML SOLN Take 3 mLs by nebulization every 4 (four) hours as needed (for shortness of breath).    Yes [provider]  LEVEMIR FLEXTOUCH 100 UNIT/ML FlexPen Inject 20 Units into the skin 2 (two) times daily.  07/13/19  Yes [provider]  lidocaine (LIDODERM) 5 % Place 1 patch onto the skin daily. Remove & Discard patch within 12 hours or as directed by MD   Yes [provider]  lisinopril (ZESTRIL) 5 MG tablet Take 1 tablet (5 mg total) by mouth daily. Patient taking differently: Place 5 mg into feeding tube daily.  07/13/19  Yes Allie Bossier, MD  Multiple Vitamin (MULTIVITAMIN WITH MINERALS) TABS tablet Take 1 tablet by mouth daily. Patient taking differently: Place 1 tablet into feeding tube daily.  07/13/19  Yes Allie Bossier, MD  Nutritional Supplements (FEEDING SUPPLEMENT, OSMOLITE 1.5 CAL,) LIQD Place 1,000 mLs into feeding tube continuous. Patient taking differently: Place 20-40 mL/hr into feeding tube See admin instructions. Beginning on 10/06/2019, start at 20 ml's/hr and increase by 10 ml's/hr per day- goal is 40 ml's/hr 07/12/19  Yes Allie Bossier, MD  Olopatadine HCl 0.2 % SOLN Place 1 drop into both eyes daily.    Yes [provider]  ondansetron (ZOFRAN-ODT) 4 MG disintegrating tablet 4 mg See admin instructions. Dissolve 4 mg into g-tube every 6 hours as needed for nausea   Yes [provider]  polyethylene glycol (MIRALAX / GLYCOLAX) 17 g packet Take 17 g by mouth daily. Patient taking differently: Place 17 g into feeding tube daily.  07/18/19  Yes Candee Furbish, MD  potassium chloride 20 MEQ/15ML (10%) SOLN Place 20 mEq into  feeding tube daily.   Yes [provider]  scopolamine (TRANSDERM-SCOP) 1 MG/3DAYS Place 1 patch (1.5 mg total) onto the skin every 3 (three) days. 07/12/19  Yes Allie Bossier, MD  sertraline (ZOLOFT) 100 MG tablet Place 1 tablet (100 mg total) into feeding tube at bedtime. 07/12/19  Yes Allie Bossier, MD  simethicone (MI-ACID GAS RELIEF) 80 MG chewable tablet Place 80 mg into feeding tube every 8 (eight) hours as needed for flatulence.   Yes [provider]  Sodium Chloride Flush (NORMAL SALINE FLUSH IV) Inject 10 mLs into the vein See admin instructions. 10 ml's as needed before and after medications   Yes [provider]    This patient is critically ill with multiple organ system failure; which, requires frequent high complexity decision making, assessment, support, evaluation, and titration of therapies. This was completed through the application of advanced monitoring technologies and extensive interpretation of multiple databases. During this encounter critical care time was devoted to patient care services described in this note for 32 minutes.   Garner Nash, DO [facility] 10/07/2019 10:20 PM

## 2019-10-07 NOTE — ED Provider Notes (Signed)
Care assumed at shift change from [place], PA-C, pending CT abdpelvis and admission for sepsis. See their note for full HPI and workup. Briefly, pt presenting with decreased responsiveness that began today. Pt is a [facility] pt, trached chronically for chronic resp failure, functional quadriplegia. Less responsive than usual though responding on eval today. Recently treated for ileus, restarted tube feedings today and abdomen noted to be more distended today. Febrile per rectal temp today, hypotensive. Received 30mg /kg IVF, Abx vanc and zosyn. UTI on U/A as possible sourse. CXR with infiltrates though improving. COVID neg.   Plan to follow CT and admit for sepsis. Urine likely source if CT negative for infectious pathology.  Physical Exam  BP 109/70   Pulse 71   Temp (!) 100.7 F (38.2 C) (Rectal)   Resp 14   Ht 5\' 6"  (1.676 m)   Wt 66.8 kg   SpO2 100%   BMI 23.77 kg/m   Physical Exam Constitutional:      Comments: Pt sleeping  Cardiovascular:     Rate and Rhythm: Normal rate.  Pulmonary:     Comments: Ventilation per trach    ED Course/Procedures   Clinical Course as of Oct 06 2048  Fri Oct 07, 2019  2012 Dr. Alcario Drought to admit. Requests consult to PCCM for vent management, gen surgery for persisting ileus.   [JR]  2045 Dr. Kae Heller recommending GI consult. Dr. Alcario Drought made aware. Appreciate consult.   [JR]    Clinical Course User Index [JR] Diana Davenport, [place] N, PA-C    Procedures Results for orders placed or performed during the hospital encounter of 10/07/19  Respiratory Panel by RT PCR (Flu A&B, Covid) - Nasopharyngeal Swab   Specimen: Nasopharyngeal Swab  Result Value Ref Range   SARS Coronavirus 2 by RT PCR NEGATIVE NEGATIVE   Influenza A by PCR NEGATIVE NEGATIVE   Influenza B by PCR NEGATIVE NEGATIVE  CBC with Differential  Result Value Ref Range   WBC 19.2 (H) 4.0 - 10.5 K/uL   RBC 4.95 4.22 - 5.81 MIL/uL   Hemoglobin 12.1 (L) 13.0 - 17.0 g/dL   HCT 36.0 (L) 39  - 52 %   MCV 72.7 (L) 80.0 - 100.0 fL   MCH 24.4 (L) 26.0 - 34.0 pg   MCHC 33.6 30.0 - 36.0 g/dL   RDW 15.9 (H) 11.5 - 15.5 %   Platelets 234 150 - 400 K/uL   nRBC 0.0 0.0 - 0.2 %   Neutrophils Relative % 72 %   Neutro Abs 13.7 (H) 1.7 - 7.7 K/uL   Lymphocytes Relative 15 %   Lymphs Abs 3.0 0.7 - 4.0 K/uL   Monocytes Relative 12 %   Monocytes Absolute 2.3 (H) 0 - 1 K/uL   Eosinophils Relative 0 %   Eosinophils Absolute 0.1 0 - 0 K/uL   Basophils Relative 0 %   Basophils Absolute 0.1 0 - 0 K/uL   Immature Granulocytes 1 %   Abs Immature Granulocytes 0.11 (H) 0.00 - 0.07 K/uL  Comprehensive metabolic panel  Result Value Ref Range   Sodium 133 (L) 135 - 145 mmol/L   Potassium 3.1 (L) 3.5 - 5.1 mmol/L   Chloride 107 98 - 111 mmol/L   CO2 15 (L) 22 - 32 mmol/L   Glucose, Bld 155 (H) 70 - 99 mg/dL   BUN 25 (H) 8 - 23 mg/dL   Creatinine, Ser 0.87 0.61 - 1.24 mg/dL   Calcium 8.4 (L) 8.9 - 10.3 mg/dL   Total  Protein 7.1 6.5 - 8.1 g/dL   Albumin 2.8 (L) 3.5 - 5.0 g/dL   AST 13 (L) 15 - 41 U/L   ALT 15 0 - 44 U/L   Alkaline Phosphatase 67 38 - 126 U/L   Total Bilirubin 1.1 0.3 - 1.2 mg/dL   GFR calc non Af Amer >60 >60 mL/min   GFR calc Af Amer >60 >60 mL/min   Anion gap 11 5 - 15  Urinalysis, Routine w reflex microscopic Nasopharyngeal Swab  Result Value Ref Range   Color, Urine AMBER (A) YELLOW   APPearance CLOUDY (A) CLEAR   Specific Gravity, Urine 1.015 1.005 - 1.030   pH 5.0 5.0 - 8.0   Glucose, UA NEGATIVE NEGATIVE mg/dL   Hgb urine dipstick LARGE (A) NEGATIVE   Bilirubin Urine NEGATIVE NEGATIVE   Ketones, ur NEGATIVE NEGATIVE mg/dL   Protein, ur 100 (A) NEGATIVE mg/dL   Nitrite NEGATIVE NEGATIVE   Leukocytes,Ua LARGE (A) NEGATIVE   RBC / HPF 21-50 0 - 5 RBC/hpf   WBC, UA >50 (H) 0 - 5 WBC/hpf   Bacteria, UA MANY (A) NONE SEEN   WBC Clumps PRESENT    Budding Yeast NONE SEEN    Ca Oxalate Crys, UA PRESENT   Lipase, blood  Result Value Ref Range   Lipase 27 11 - 51  U/L  Lactic acid, plasma  Result Value Ref Range   Lactic Acid, Venous 1.2 0.5 - 1.9 mmol/L  POC CBG, ED  Result Value Ref Range   Glucose-Capillary 77 70 - 99 mg/dL   CT ABDOMEN PELVIS W CONTRAST  Result Date: 10/07/2019 CLINICAL DATA:  Abdominal distention EXAM: CT ABDOMEN AND PELVIS WITH CONTRAST TECHNIQUE: Multidetector CT imaging of the abdomen and pelvis was performed using the standard protocol following bolus administration of intravenous contrast. CONTRAST:  128mL OMNIPAQUE IOHEXOL 300 MG/ML  SOLN COMPARISON:  07/06/2019 FINDINGS: Lower chest: Small bilateral pleural effusions with basilar consolidation or atelectasis, greater on the right. Hepatobiliary: No focal liver abnormality is seen. No gallstones, gallbladder wall thickening, or biliary dilatation. Pancreas: Unremarkable. No pancreatic ductal dilatation or surrounding inflammatory changes. Spleen: Normal in size without focal abnormality. Adrenals/Urinary Tract: No adrenal gland nodules. Severe atrophy and scarring of the right kidney. Calcification at the level of the mid right ureter may be in the ureter or a phlebolith in the adjacent gonadal vein. Mild right hilar caliectasis, possibly chronic ureteropelvic junction obstruction. Moderate focal scarring of the left kidney. Large left renal cyst. Bladder is decompressed with a Foley catheter. Stomach/Bowel: The colon is diffusely gas distended with small amount of fluid. Contrast material is present in the rectum. Changes likely represent adynamic ileus. The appendix is normal. Small bowel are mostly decompressed. A percutaneous gastrostomy tube is present in the stomach. Vascular/Lymphatic: Aortic atherosclerosis. No enlarged abdominal or pelvic lymph nodes. Reproductive: Prostate gland is not enlarged. Other: No free air or free fluid in the abdomen. Abdominal wall musculature is atrophic. Musculoskeletal: Degenerative changes in the spine. Benign-appearing sclerosis in the inter  trochanteric left femur. IMPRESSION: 1. Diffusely gas distended colon with small amount of fluid. Changes likely represent adynamic ileus. 2. Small bilateral pleural effusions with basilar consolidation or atelectasis, greater on the right. 3. Severe atrophy and scarring of the right kidney. 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 Moderate focal scarring of the left kidney. Large left renal cyst. 4. Aortic atherosclerosis. 5. Percutaneous gastrostomy tube in the stomach. Aortic Atherosclerosis (ICD10-I70.0). Electronically Signed   By: Oren Beckmann.D.  On: 10/07/2019 19:30   DG Chest Portable 1 View  Result Date: 10/07/2019 CLINICAL DATA:  Altered mental status. History of respiratory failure. Tracheostomy. EXAM: PORTABLE CHEST 1 VIEW COMPARISON:  07/16/2019. FINDINGS: Tracheostomy tube in stable position. Stable cardiomegaly. Low lung volumes with bibasilar atelectasis and infiltrates. Slight improvement in aeration of the lung basis from prior exam. No prominent pleural effusion. No pneumothorax. IMPRESSION: 1.  Tracheostomy tube in stable position. 2. Low lung volumes with bibasilar atelectasis and infiltrates again noted. Slight improvement in aeration of the lung bases from prior exam. Electronically Signed   By: [place]   On: 10/07/2019 14:11    MDM  CT with persistent ileus. VS are stable, BP improved   Patient admitted to hospitalist service for management of sepsis, likely 2/t UTI. hospitalist to consult GI in the morning for recommendations regarding persistent ileus.       Chalsey Leeth, [place] N, PA-C 10/07/19 2053    Lennice Sites, DO 10/10/19 [phone]

## 2019-10-07 NOTE — ED Notes (Signed)
Please call wife June with update please

## 2019-10-07 NOTE — Op Note (Signed)
Reason for Consult: Abdominal distention  Referring Physician: Jennette Kettle, MD  Luis Orr is an 69 y.o. male with PMH of chronic respiratory failure requiring tracheostomy, quadriplegia, HTN, DM presenting from [facility] with altered mental status,  abdominal distention, fever and hypotension.   Of note ,all history obtained from chart review as their is no family member by the bedside. He was recently admitted with Klebsiella UTI. Today was noticed to be more confused and lethargic at  the facility. He was tolerating TF, but they noticed he was more distended today. Unclear when he had last BM. Reported to be febrile today.   In the ED, he is tachycardic and hypotensive. Labs remarkable for WBC 19.2, normal lipase and LFTs. Positive UA. CT abd/pelvis consistent with ileus, there is gas present all the way down to rectum, no free air or free fluid, or evidence of bowel ischemia. He is being admitted to ICU, surgery being consulted due to abdominal distention.    Past Medical History:  Diagnosis Date  . Anemia   . Anxiety   . Chronic respiratory failure ([place])   . DM2 (diabetes mellitus, type 2) ([place])   . Functional quadriplegia ([facility])   . Hypertension   . Urine retention     Past Surgical History:  Procedure Laterality Date  . PEG PLACEMENT    . ROTATOR CUFF REPAIR    . TRACHEOSTOMY      No family history on file.  Social History:  reports that he has never smoked. He has never used smokeless tobacco. No history on file for alcohol use and drug use.  Allergies:  Allergies  Allergen Reactions  . Adhesive [Tape] Rash    Medications: I have reviewed the patient's current medications.  Results for orders placed or performed during the hospital encounter of 10/07/19 (from the past 48 hour(s))  CBC with Differential     Status: Abnormal   Collection Time: 10/07/19  2:25 PM  Result Value Ref Range   WBC 19.2 (H) 4.0 - 10.5 K/uL   RBC 4.95 4.22 - 5.81 MIL/uL    Hemoglobin 12.1 (L) 13.0 - 17.0 g/dL   HCT 36.0 (L) 39 - 52 %   MCV 72.7 (L) 80.0 - 100.0 fL   MCH 24.4 (L) 26.0 - 34.0 pg   MCHC 33.6 30.0 - 36.0 g/dL   RDW 15.9 (H) 11.5 - 15.5 %   Platelets 234 150 - 400 K/uL   nRBC 0.0 0.0 - 0.2 %   Neutrophils Relative % 72 %   Neutro Abs 13.7 (H) 1.7 - 7.7 K/uL   Lymphocytes Relative 15 %   Lymphs Abs 3.0 0.7 - 4.0 K/uL   Monocytes Relative 12 %   Monocytes Absolute 2.3 (H) 0 - 1 K/uL   Eosinophils Relative 0 %   Eosinophils Absolute 0.1 0 - 0 K/uL   Basophils Relative 0 %   Basophils Absolute 0.1 0 - 0 K/uL   Immature Granulocytes 1 %   Abs Immature Granulocytes 0.11 (H) 0.00 - 0.07 K/uL    Comment: Performed at [facility], [address], [place], [place] [address]  Comprehensive metabolic panel     Status: Abnormal   Collection Time: 10/07/19  2:25 PM  Result Value Ref Range   Sodium 133 (L) 135 - 145 mmol/L   Potassium 3.1 (L) 3.5 - 5.1 mmol/L   Chloride 107 98 - 111 mmol/L   CO2 15 (L) 22 - 32 mmol/L  Glucose, Bld 155 (H) 70 - 99 mg/dL    Comment: Glucose reference range applies only to samples taken after fasting for at least 8 hours.   BUN 25 (H) 8 - 23 mg/dL   Creatinine, Ser 0.87 0.61 - 1.24 mg/dL   Calcium 8.4 (L) 8.9 - 10.3 mg/dL   Total Protein 7.1 6.5 - 8.1 g/dL   Albumin 2.8 (L) 3.5 - 5.0 g/dL   AST 13 (L) 15 - 41 U/L   ALT 15 0 - 44 U/L   Alkaline Phosphatase 67 38 - 126 U/L   Total Bilirubin 1.1 0.3 - 1.2 mg/dL   GFR calc non Af Amer >60 >60 mL/min   GFR calc Af Amer >60 >60 mL/min   Anion gap 11 5 - 15    Comment: Performed at [facility], [place] [address] [place], [place] [address]  Lipase, blood     Status: None   Collection Time: 10/07/19  2:25 PM  Result Value Ref Range   Lipase 27 11 - 51 U/L    Comment: Performed at [facility], [place] [address] [place], [place] [address]  Lactic acid, plasma     Status: None   Collection Time: 10/07/19  2:25 PM  Result Value Ref Range   Lactic Acid,  Venous 1.2 0.5 - 1.9 mmol/L    Comment: Performed at [facility] [address] [place], [place] [address]  Respiratory Panel by RT PCR (Flu A&B, Covid) - Nasopharyngeal Swab     Status: None   Collection Time: 10/07/19  2:44 PM   Specimen: Nasopharyngeal Swab  Result Value Ref Range   SARS Coronavirus 2 by RT PCR NEGATIVE NEGATIVE    Comment: (NOTE) SARS-CoV-2 target nucleic acids are NOT DETECTED.  The SARS-CoV-2 RNA is generally detectable in upper respiratoy specimens during the acute phase of infection. The lowest concentration of SARS-CoV-2 viral copies this assay can detect is 131 copies/mL. A negative result does not preclude SARS-Cov-2 infection and should not be used as the sole basis for treatment or other patient management decisions. A negative result may occur with  improper specimen collection/handling, submission of specimen other than nasopharyngeal swab, presence of viral mutation(s) within the areas targeted by this assay, and inadequate number of viral copies (<131 copies/mL). A negative result must be combined with clinical observations, patient history, and epidemiological information. The expected result is Negative.  Fact Sheet for Patients:  PinkCheek.be  Fact Sheet for Healthcare Providers:  GravelBags.it  This test is no t yet approved or cleared by the [place] FDA and  has been authorized for detection and/or diagnosis of SARS-CoV-2 by FDA under an Emergency Use Authorization (EUA). This EUA will remain  in effect (meaning this test can be used) for the duration of the COVID-19 declaration under Section 564(b)(1) of the Act, 21 U.S.C. section 360bbb-3(b)(1), unless the authorization is terminated or revoked sooner.     Influenza A by PCR NEGATIVE NEGATIVE   Influenza B by PCR NEGATIVE NEGATIVE    Comment: (NOTE) The Xpert Xpress SARS-CoV-2/FLU/RSV assay is intended as an aid in   the diagnosis of influenza from Nasopharyngeal swab specimens and  should not be used as a sole basis for treatment. Nasal washings and  aspirates are unacceptable for Xpert Xpress SARS-CoV-2/FLU/RSV  testing.  Fact Sheet for Patients: PinkCheek.be  Fact Sheet for Healthcare Providers: GravelBags.it  This test is not yet approved or cleared by the [place] FDA  and  has been authorized for detection and/or diagnosis of SARS-CoV-2 by  FDA under an Emergency Use Authorization (EUA). This EUA will remain  in effect (meaning this test can be used) for the duration of the  Covid-19 declaration under Section 564(b)(1) of the Act, 21  U.S.C. section 360bbb-3(b)(1), unless the authorization is  terminated or revoked. Performed at [facility], [place] [address] [place], [place] [address]   Urinalysis, Routine w reflex microscopic Nasopharyngeal Swab     Status: Abnormal   Collection Time: 10/07/19  2:45 PM  Result Value Ref Range   Color, Urine AMBER (A) YELLOW    Comment: BIOCHEMICALS MAY BE AFFECTED BY COLOR   APPearance CLOUDY (A) CLEAR   Specific Gravity, Urine 1.015 1.005 - 1.030   pH 5.0 5.0 - 8.0   Glucose, UA NEGATIVE NEGATIVE mg/dL   Hgb urine dipstick LARGE (A) NEGATIVE   Bilirubin Urine NEGATIVE NEGATIVE   Ketones, ur NEGATIVE NEGATIVE mg/dL   Protein, ur 100 (A) NEGATIVE mg/dL   Nitrite NEGATIVE NEGATIVE   Leukocytes,Ua LARGE (A) NEGATIVE   RBC / HPF 21-50 0 - 5 RBC/hpf   WBC, UA >50 (H) 0 - 5 WBC/hpf   Bacteria, UA MANY (A) NONE SEEN   WBC Clumps PRESENT    Budding Yeast NONE SEEN     Comment: CORRECTED REPORT PREVIOUSLY REPORTED AS PRESENT CALLED TO L SCHULAR RN [phone] B WYLIE CORRECTED ON 09/24 AT 1601: PREVIOUSLY REPORTED AS PRESENT    Ca Oxalate Crys, UA PRESENT     Comment: Performed at [facility], [place] [address] [place], [place] [address]  POC CBG, ED     Status: None    Collection Time: 10/07/19  8:26 PM  Result Value Ref Range   Glucose-Capillary 77 70 - 99 mg/dL    Comment: Glucose reference range applies only to samples taken after fasting for at least 8 hours.    CT ABDOMEN PELVIS W CONTRAST  Result Date: 10/07/2019 CLINICAL DATA:  Abdominal distention EXAM: CT ABDOMEN AND PELVIS WITH CONTRAST TECHNIQUE: Multidetector CT imaging of the abdomen and pelvis was performed using the standard protocol following bolus administration of intravenous contrast. CONTRAST:  139mL OMNIPAQUE IOHEXOL 300 MG/ML  SOLN COMPARISON:  07/06/2019 FINDINGS: Lower chest: Small bilateral pleural effusions with basilar consolidation or atelectasis, greater on the right. Hepatobiliary: No focal liver abnormality is seen. No gallstones, gallbladder wall thickening, or biliary dilatation. Pancreas: Unremarkable. No pancreatic ductal dilatation or surrounding inflammatory changes. Spleen: Normal in size without focal abnormality. Adrenals/Urinary Tract: No adrenal gland nodules. Severe atrophy and scarring of the right kidney. Calcification at the level of the mid right ureter may be in the ureter or a phlebolith in the adjacent gonadal vein. Mild right hilar caliectasis, possibly chronic ureteropelvic junction obstruction. Moderate focal scarring of the left kidney. Large left renal cyst. Bladder is decompressed with a Foley catheter. Stomach/Bowel: The colon is diffusely gas distended with small amount of fluid. Contrast material is present in the rectum. Changes likely represent adynamic ileus. The appendix is normal. Small bowel are mostly decompressed. A percutaneous gastrostomy tube is present in the stomach. Vascular/Lymphatic: Aortic atherosclerosis. No enlarged abdominal or pelvic lymph nodes. Reproductive: Prostate gland is not enlarged. Other: No free air or free fluid in the abdomen. Abdominal wall musculature is atrophic. Musculoskeletal: Degenerative changes in the spine.  Benign-appearing sclerosis in the inter trochanteric left femur. IMPRESSION: 1. Diffusely gas distended colon with small amount of fluid.  Changes likely represent adynamic ileus. 2. Small bilateral pleural effusions with basilar consolidation or atelectasis, greater on the right. 3. Severe atrophy and scarring of the right kidney. 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 Moderate focal scarring of the left kidney. Large left renal cyst. 4. Aortic atherosclerosis. 5. Percutaneous gastrostomy tube in the stomach. Aortic Atherosclerosis (ICD10-I70.0). Electronically Signed   By: Lucienne Capers M.D.   On: 10/07/2019 19:30   DG Chest Portable 1 View  Result Date: 10/07/2019 CLINICAL DATA:  Altered mental status. History of respiratory failure. Tracheostomy. EXAM: PORTABLE CHEST 1 VIEW COMPARISON:  07/16/2019. FINDINGS: Tracheostomy tube in stable position. Stable cardiomegaly. Low lung volumes with bibasilar atelectasis and infiltrates. Slight improvement in aeration of the lung basis from prior exam. No prominent pleural effusion. No pneumothorax. IMPRESSION: 1.  Tracheostomy tube in stable position. 2. Low lung volumes with bibasilar atelectasis and infiltrates again noted. Slight improvement in aeration of the lung bases from prior exam. Electronically Signed   By: [place]   On: 10/07/2019 14:11    Review of Systems  Constitutional: Positive for fever.  Respiratory: Negative.   Cardiovascular: Negative.   Gastrointestinal: Negative for abdominal pain, blood in stool and diarrhea.  Skin: Negative.     PE Blood pressure (!) 144/89, pulse (!) 56, temperature (!) 100.7 F (38.2 C), temperature source Rectal, resp. rate 19, height 5\' 6"  (1.676 m), weight 66.8 kg, SpO2 98 %. Constitutional: Intubated, confused  Eyes: Moist conjunctiva; no lid lag; anicteric; PERRL Neck: Tracheostomy in place Lungs: Normal respiratory effort CV: Tachycardic GI: Abd is soft but distended, patient does not seem  to grimace when examined Ext: Pitting edema in bilateral upper and lower extremities    Assessment/Plan: with PMH of chronic respiratory failure requiring tracheostomy, quadriplegia, HTN, DM presenting from [facility] with altered mental status,  abdominal distention, fever and hypotension. Likely related to urosepsis. Abdomen is distended but soft, no evidence of peritonitis. CT abd/pelv with no signs of ischemia, free air or free fluid, there is air all the way down to rectum, no evidence of bowel obstruction. Likely ileus secondary to complicated UTI.   -keep NPO -keep G tube to gravity -correct electrolytes -agree with broad spectrum antibiotics -consider GI consult if persistent ileus    Daiva Huge, MD 10/07/2019, 10:30 PM

## 2019-10-07 NOTE — Progress Notes (Addendum)
Informed by RN: Pt just went into new onset A.Fib RVR - HR 180s, BP 138/81.  1) Cardizem bolus and gtt ordered 2) ordering heparin gtt per pharm consult; CHADS-VASC is at least a 62 (age, HTN, DM2). 3) ordering EKG

## 2019-10-07 NOTE — ED Provider Notes (Signed)
[facility] EMERGENCY DEPARTMENT Provider Note   CSN: [id number] Arrival date & time: 10/07/19  1314     History Chief Complaint  Patient presents with  . Altered Mental Status    Luis Orr is a 69 y.o. male presenting for evaluation of AMS.   Level 5 caveat as patient is vented and trached with altered mental status.  Per [facility], they were called out due to patient being less responsive than normal.  Patient is normally joking with staff, but today was minimally responsive.  They also state patient recently has been being treated for an ileus, restarted tube feedings today.  Patient's abdomen is distended per staff.  Additional history obtained in chart review.  Patient with a history of chronic respiratory failure on a vent and trach, anemia, diabetes, functional quadriplegia, hypertension.  Most recent CT in our system was from 06/2019 which shows colitis and PNA.   HPI     Past Medical History:  Diagnosis Date  . Anemia   . Anxiety   . Chronic respiratory failure ([place])   . DM2 (diabetes mellitus, type 2) ([place])   . Functional quadriplegia (H[facility])   . Hypertension   . Urine retention     Patient Active Problem List   Diagnosis Date Noted  . AMS (altered mental status) 07/17/2019  . SIRS (systemic inflammatory response syndrome) ([facility])   . Ventilator dependence ([place])   . Bacteremia due to Staphylococcus 07/07/2019  . Functional quadriplegia ([place]) 07/06/2019  . Chronic respiratory failure ([place]) 07/06/2019  . Sepsis secondary to UTI ([place]) 07/06/2019  . Fecal impaction ([place]) 07/06/2019  . HTN (hypertension) 07/06/2019  . DM2 (diabetes mellitus, type 2) ([place]) 07/06/2019    Past Surgical History:  Procedure Laterality Date  . PEG PLACEMENT    . ROTATOR CUFF REPAIR    . TRACHEOSTOMY         No family history on file.  Social History   Tobacco Use  . Smoking status: Never Smoker  . Smokeless tobacco: Never Used  Substance Use  Topics  . Alcohol use: Not on file  . Drug use: Not on file    Home Medications Prior to Admission medications   Medication Sig Start Date End Date Taking? Authorizing Provider  acetaminophen (TYLENOL) 160 MG/5ML solution Place 20.3 mLs (650 mg total) into feeding tube every 6 (six) hours as needed for mild pain (or Fever >/= 101). 07/12/19   Allie Bossier, MD  acetaminophen (TYLENOL) 650 MG suppository Place 1 suppository (650 mg total) rectally every 6 (six) hours as needed for mild pain (or Fever >/= 101). 07/12/19   Allie Bossier, MD  Amino Acids-Protein Hydrolys (FEEDING SUPPLEMENT, PRO-STAT SUGAR FREE 64,) LIQD Place 30 mLs into feeding tube 3 (three) times daily. 07/12/19   Allie Bossier, MD  amLODipine (NORVASC) 10 MG tablet Place 10 mg into feeding tube daily.     [provider]  chlorhexidine (PERIDEX) 0.12 % solution Use as directed 5 mLs in the mouth or throat 2 (two) times daily.    [provider]  clonazePAM (KLONOPIN) 0.5 MG tablet Place 1 tablet (0.5 mg total) into feeding tube every 8 (eight) hours. 07/12/19   Allie Bossier, MD  cloNIDine (CATAPRES) 0.1 MG tablet Place 1 tablet (0.1 mg total) into feeding tube every 8 (eight) hours. 07/12/19   Allie Bossier, MD  docusate (COLACE) 50 MG/5ML liquid Place 10 mLs (100 mg total) into feeding tube every 12 (  twelve) hours. 07/12/19   Allie Bossier, MD  famotidine (PEPCID) 20 MG tablet Place 1 tablet (20 mg total) into feeding tube at bedtime. 07/12/19   Allie Bossier, MD  fentaNYL (DURAGESIC) 25 MCG/HR Place 1 patch onto the skin every 3 (three) days. 07/14/19   Allie Bossier, MD  gabapentin (NEURONTIN) 250 MG/5ML solution Place 6 mLs (300 mg total) into feeding tube 2 (two) times daily. 07/12/19   Allie Bossier, MD  HYDROcodone-acetaminophen (NORCO) 7.5-325 MG tablet Place 1 tablet into feeding tube 2 (two) times daily. 07/12/19   Allie Bossier, MD  insulin aspart (NOVOLOG) 100 UNIT/ML injection Inject 0-15  Units into the skin every 4 (four) hours. 07/17/19   Candee Furbish, MD  insulin regular (NOVOLIN R) 100 units/mL injection Inject 2-10 Units into the skin in the morning, at noon, in the evening, and at bedtime. Per sliding scale:  151-200 = 2 units, 201-250= 4 units, 251-300= 6 units, 301-350= 8 units, 351-400= 10 units.    [provider]  ipratropium-albuterol (DUONEB) 0.5-2.5 (3) MG/3ML SOLN Take 3 mLs by nebulization every 4 (four) hours as needed (wheezing).     [provider]  LEVEMIR FLEXTOUCH 100 UNIT/ML FlexPen Inject 10 Units into the skin at bedtime. 07/13/19   [provider]  lisinopril (ZESTRIL) 5 MG tablet Take 1 tablet (5 mg total) by mouth daily. 07/13/19   Allie Bossier, MD  Multiple Vitamin (MULTIVITAMIN WITH MINERALS) TABS tablet Take 1 tablet by mouth daily. 07/13/19   Allie Bossier, MD  Nutritional Supplements (FEEDING SUPPLEMENT, OSMOLITE 1.5 CAL,) LIQD Place 1,000 mLs into feeding tube continuous. Patient not taking: Reported on 07/17/2019 07/12/19   Allie Bossier, MD  Olopatadine HCl 0.2 % SOLN Place 1 drop into both eyes daily.    [provider]  ondansetron (ZOFRAN-ODT) 4 MG disintegrating tablet Take 4 mg by mouth every 6 (six) hours as needed for nausea or vomiting.    [provider]  polyethylene glycol (MIRALAX / GLYCOLAX) 17 g packet Take 17 g by mouth daily. 07/18/19   Candee Furbish, MD  scopolamine (TRANSDERM-SCOP) 1 MG/3DAYS Place 1 patch (1.5 mg total) onto the skin every 3 (three) days. 07/12/19   Allie Bossier, MD  sertraline (ZOLOFT) 100 MG tablet Place 1 tablet (100 mg total) into feeding tube at bedtime. 07/12/19   Allie Bossier, MD  simethicone (MYLICON) 80 MG chewable tablet Place 1 tablet (80 mg total) into feeding tube every 8 (eight) hours as needed for flatulence. 07/12/19   Allie Bossier, MD    Allergies    Patient has no known allergies.  Review of Systems   Review of Systems  Unable to perform  ROS: Patient nonverbal  Gastrointestinal: Positive for abdominal distention.  Psychiatric/Behavioral: Positive for confusion.    Physical Exam Updated Vital Signs BP 109/70   Pulse 71   Temp (!) 100.7 F (38.2 C) (Rectal)   Resp 14   Ht 5\' 6"  (1.676 m)   Wt 66.8 kg   SpO2 100%   BMI 23.77 kg/m   Physical Exam Vitals and nursing note reviewed.  Constitutional:      General: He is not in acute distress.    Appearance: He is well-developed.     Comments: Appears chronically ill  HENT:     Head: Normocephalic and atraumatic.  Eyes:     Extraocular Movements: Extraocular movements intact.     Conjunctiva/sclera: Conjunctivae  normal.     Pupils: Pupils are equal, round, and reactive to light.     Comments: vent and trach  Cardiovascular:     Rate and Rhythm: Normal rate and regular rhythm.     Pulses: Normal pulses.  Pulmonary:     Effort: Pulmonary effort is normal. No respiratory distress.     Breath sounds: Normal breath sounds. No wheezing.  Abdominal:     General: There is distension.     Palpations: Abdomen is soft. There is no mass.     Tenderness: There is no abdominal tenderness. There is no guarding or rebound.  Musculoskeletal:     Cervical back: Normal range of motion and neck supple.     Comments: Functional quadriplegia.   Skin:    General: Skin is warm and dry.     Capillary Refill: Capillary refill takes less than 2 seconds.     ED Results / Procedures / Treatments   Labs (all labs ordered are listed, but only abnormal results are displayed) Labs Reviewed  CBC WITH DIFFERENTIAL/PLATELET - Abnormal; Notable for the following components:      Result Value   WBC 19.2 (*)    Hemoglobin 12.1 (*)    HCT 36.0 (*)    MCV 72.7 (*)    MCH 24.4 (*)    RDW 15.9 (*)    Neutro Abs 13.7 (*)    Monocytes Absolute 2.3 (*)    Abs Immature Granulocytes 0.11 (*)    All other components within normal limits  COMPREHENSIVE METABOLIC PANEL - Abnormal; Notable for  the following components:   Sodium 133 (*)    Potassium 3.1 (*)    CO2 15 (*)    Glucose, Bld 155 (*)    BUN 25 (*)    Calcium 8.4 (*)    Albumin 2.8 (*)    AST 13 (*)    All other components within normal limits  URINALYSIS, ROUTINE W REFLEX MICROSCOPIC - Abnormal; Notable for the following components:   Color, Urine AMBER (*)    APPearance CLOUDY (*)    Hgb urine dipstick LARGE (*)    Protein, ur 100 (*)    Leukocytes,Ua LARGE (*)    WBC, UA >50 (*)    Bacteria, UA MANY (*)    All other components within normal limits  RESPIRATORY PANEL BY RT PCR (FLU A&B, COVID)  CULTURE, BLOOD (ROUTINE X 2)  CULTURE, BLOOD (ROUTINE X 2)  URINE CULTURE  CULTURE, RESPIRATORY  LIPASE, BLOOD  LACTIC ACID, PLASMA  LACTIC ACID, PLASMA  CBG MONITORING, ED    EKG EKG Interpretation  Date/Time:  Friday October 07 2019 13:33:25 EDT Ventricular Rate:  92 PR Interval:    QRS Duration: 99 QT Interval:  439 QTC Calculation: 481 R Axis:   -21 Text Interpretation: Sinus rhythm Multiform ventricular premature complexes Borderline left axis deviation Abnormal R-wave progression, early transition Nonspecific T abnormalities, anterior leads Borderline prolonged QT interval Confirmed by Lennice Sites [phone]) on 10/07/2019 1:35:11 PM Also confirmed by Lennice Sites [phone]), editor Victory Dakin [phone])  on 10/07/2019 1:36:54 PM   Radiology DG Chest Portable 1 View  Result Date: 10/07/2019 CLINICAL DATA:  Altered mental status. History of respiratory failure. Tracheostomy. EXAM: PORTABLE CHEST 1 VIEW COMPARISON:  07/16/2019. FINDINGS: Tracheostomy tube in stable position. Stable cardiomegaly. Low lung volumes with bibasilar atelectasis and infiltrates. Slight improvement in aeration of the lung basis from prior exam. No prominent pleural effusion. No pneumothorax. IMPRESSION: 1.  Tracheostomy  tube in stable position. 2. Low lung volumes with bibasilar atelectasis and infiltrates again noted. Slight  improvement in aeration of the lung bases from prior exam. Electronically Signed   By: [place]   On: 10/07/2019 14:11    Procedures .Critical Care Performed by: Franchot Heidelberg, PA-C Authorized by: Franchot Heidelberg, PA-C   Critical care provider statement:    Critical care time (minutes):  45   Critical care time was exclusive of:  Separately billable procedures and treating other patients and teaching time   Critical care was necessary to treat or prevent imminent or life-threatening deterioration of the following conditions:  Sepsis   Critical care was time spent personally by me on the following activities:  Blood draw for specimens, development of treatment plan with patient or surrogate, obtaining history from patient or surrogate, evaluation of patient's response to treatment, examination of patient, ordering and performing treatments and interventions, ordering and review of laboratory studies, ordering and review of radiographic studies, pulse oximetry, re-evaluation of patient's condition and review of old charts   I assumed direction of critical care for this patient from another provider in my specialty: no   Comments:     Pt presenting meeting SIRS criteria. IV abx and fluids started and pt to be admitted Ultrasound ED Peripheral IV (Provider)  Date/Time: 10/07/2019 5:39 PM Performed by: Lennice Sites, DO Authorized by: Lennice Sites, DO   Procedure details:    Indications: hypotension and multiple failed IV attempts     Skin Prep: chlorhexidine gluconate     Location:  Right AC   Angiocath:  20 G   Bedside Ultrasound Guided: Yes     Images: not archived     Patient tolerated procedure without complications: Yes     Dressing applied: Yes     (including critical care time)  Medications Ordered in ED Medications  vancomycin (VANCOREADY) IVPB 1250 mg/250 mL (1,250 mg Intravenous New Bag/Given 10/07/19 1649)  vancomycin (VANCOCIN) IVPB 1000 mg/200 mL premix  (has no administration in time range)  piperacillin-tazobactam (ZOSYN) IVPB 3.375 g (has no administration in time range)  lactated ringers infusion (has no administration in time range)  lactated ringers bolus 1,000 mL (1,000 mLs Intravenous New Bag/Given 10/07/19 1648)    And  lactated ringers bolus 1,000 mL (1,000 mLs Intravenous New Bag/Given 10/07/19 1647)    And  lactated ringers bolus 250 mL (has no administration in time range)  sodium chloride 0.9 % bolus 250 mL (250 mLs Intravenous New Bag/Given 10/07/19 1441)  piperacillin-tazobactam (ZOSYN) IVPB 3.375 g (0 g Intravenous Stopped 10/07/19 1513)    ED Course  I have reviewed the triage vital signs and the nursing notes.  Pertinent labs & imaging results that were available during my care of the patient were reviewed by me and considered in my medical decision making (see chart for details).    MDM Rules/Calculators/A&P                          Patient presenting for evaluation of altered mental status.  On exam, patient is chronically ill.  He is mentally tried.  He is minimally responsive, that he is able to indicate yes and no response to my questions.  Abdomen is mildly distended, although this appears to have been the case in the past as well.  Patient is febrile with a rectal temperature and hypotensive, as such meets SIRS criteria.  Code sepsis called and antibiotics started.  Patient received 30 cc per kg of fluid.  Broad-spectrum antibiotics per pharmacy.  Will obtain labs, chest x-ray, urine, and CT abdomen pelvis for further evaluation.  Case discussed with attending, Dr. Ronnald Nian evaluated the patient.  Labs show leukocytosis of 19.  Lactic is normal.  Of note, patient's bicarb is 15.  Urine is grossly positive, this is likely the cause of his sepsis. CT pending.   Pt signed out to Donnelly Angelica, PA-C for f/u on CT and ultimate admission for sepsis.   Final Clinical Impression(s) / ED Diagnoses Final diagnoses:  Sepsis, due  to unspecified organism, unspecified whether acute organ dysfunction present [facility])  Urinary tract infection without hematuria, site unspecified    Rx / DC Orders ED Discharge Orders    None       Franchot Heidelberg, PA-C 10/07/19 [place], [place], DO 10/11/19 [phone]

## 2019-10-07 NOTE — ED Triage Notes (Signed)
Patient brought in by [facility] from [facility] for altered mental status. [facility] reports at baseline patient is able to mouth jokes to the staff at [facility] but today is minimally interactive. Patient is trach'd and ventilated.

## 2019-10-07 NOTE — ED Provider Notes (Signed)
Medical screening examination/treatment/procedure(s) were conducted as a shared visit with non-physician practitioner(s) and myself.  I personally evaluated the patient during the encounter. Briefly, the patient is a 69 y.o. male with history of chronic respiratory failure with chronic trach and vent, functional quadriplegia, diabetes who presents to the ED with altered mental status.  Found to have a fever.  Sepsis work-up initiated including IV antibiotics.  Has a sacral ulcer that overall has no purulent drainage.  Could be a source.  Will get chest x-ray, urine studies, Covid test and blood cultures.  He recently started tube feeds and concern for possible abdominal distention per facility.  We will get a CT scan of the abdomen and pelvis to also further evaluate.  Anticipate admission to medicine for further sepsis work-up.  Please see my PAs note for further results, evaluation, disposition of the patient.  This chart was dictated using voice recognition software.  Despite best efforts to proofread,  errors can occur which can change the documentation meaning.     EKG Interpretation  Date/Time:  Friday October 07 2019 13:33:25 EDT Ventricular Rate:  92 PR Interval:    QRS Duration: 99 QT Interval:  439 QTC Calculation: 481 R Axis:   -21 Text Interpretation: Sinus rhythm Multiform ventricular premature complexes Borderline left axis deviation Abnormal R-wave progression, early transition Nonspecific T abnormalities, anterior leads Borderline prolonged QT interval Confirmed by Lennice Sites [phone]) on 10/07/2019 1:35:11 PM           Lennice Sites, DO 10/07/19 1335

## 2019-10-07 NOTE — H&P (Addendum)
History and Physical    Luis Orr HQI:[id number] DOB: 1950-07-10 DOA: 10/07/2019  PCP: Townsend Roger, MD  Patient coming from: [facility]  I have personally briefly reviewed patient's old medical records in [place]  Chief Complaint: AMS  HPI: Luis Orr is a 69 y.o. 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  Pt recently had adynamic ileus at [facility], today was first day they were restarting feed tubes.  Abd noted to be distended today.  Today pt less responsive than baseline, normally apparently joking with staff, and nods / shakes head but not today.  Pt also with h/o ESBL K.Pneumo UTIs in June and July.  MRSA bacteremia in June.   ED Course: In the ED pt febrile to 100.7, WBC 19.2k.  Initial BP 76/55, improved to 144/89 after 2.5L LR bolus for sepsis.  UA suggests UTI with large LE, large blood, neg nitrites, > 50 WBC, many bacteria.  CT abd/pelvis: 1) adynamic ileus with distended colon 2) chronic R UPJ obstruction, severe atrophy and scaring of R kidney  Has foley in place in ED.   Review of Systems: Unable to perform due to AMS.  Past Medical History:  Diagnosis Date  . Anemia   . Anxiety   . Chronic respiratory failure ([place])   . DM2 (diabetes mellitus, type 2) ([place])   . Functional quadriplegia (HCC)   . Hypertension   . Urine retention     Past Surgical History:  Procedure Laterality Date  . PEG PLACEMENT    . ROTATOR CUFF REPAIR    . TRACHEOSTOMY       reports that he has never smoked. He has never used smokeless tobacco. No history on file for alcohol use and drug use.  No Known Allergies  No family history on file. H/o lung CA, otherwise unknown.  Prior to Admission medications   Medication Sig Start Date End Date Taking? Authorizing Provider  acetaminophen (TYLENOL) 160 MG/5ML solution Place 20.3 mLs (650 mg total) into  feeding tube every 6 (six) hours as needed for mild pain (or Fever >/= 101). 07/12/19   Allie Bossier, MD  acetaminophen (TYLENOL) 650 MG suppository Place 1 suppository (650 mg total) rectally every 6 (six) hours as needed for mild pain (or Fever >/= 101). 07/12/19   Allie Bossier, MD  Amino Acids-Protein Hydrolys (FEEDING SUPPLEMENT, PRO-STAT SUGAR FREE 64,) LIQD Place 30 mLs into feeding tube 3 (three) times daily. 07/12/19   Allie Bossier, MD  amLODipine (NORVASC) 10 MG tablet Place 10 mg into feeding tube daily.     [provider]  chlorhexidine (PERIDEX) 0.12 % solution Use as directed 5 mLs in the mouth or throat 2 (two) times daily.    [provider]  clonazePAM (KLONOPIN) 0.5 MG tablet Place 1 tablet (0.5 mg total) into feeding tube every 8 (eight) hours. 07/12/19   Allie Bossier, MD  cloNIDine (CATAPRES) 0.1 MG tablet Place 1 tablet (0.1 mg total) into feeding tube every 8 (eight) hours. 07/12/19   Allie Bossier, MD  docusate (COLACE) 50 MG/5ML liquid Place 10 mLs (100 mg total) into feeding tube every 12 (twelve) hours. 07/12/19   Allie Bossier, MD  famotidine (PEPCID) 20 MG tablet Place 1 tablet (20 mg total) into feeding tube at bedtime. 07/12/19   Allie Bossier, MD  fentaNYL (DURAGESIC) 25 MCG/HR Place 1 patch onto the skin  every 3 (three) days. 07/14/19   Allie Bossier, MD  gabapentin (NEURONTIN) 250 MG/5ML solution Place 6 mLs (300 mg total) into feeding tube 2 (two) times daily. 07/12/19   Allie Bossier, MD  HYDROcodone-acetaminophen (NORCO) 7.5-325 MG tablet Place 1 tablet into feeding tube 2 (two) times daily. 07/12/19   Allie Bossier, MD  insulin aspart (NOVOLOG) 100 UNIT/ML injection Inject 0-15 Units into the skin every 4 (four) hours. 07/17/19   Candee Furbish, MD  insulin regular (NOVOLIN R) 100 units/mL injection Inject 2-10 Units into the skin in the morning, at noon, in the evening, and at bedtime. Per sliding scale:  151-200 = 2 units, 201-250= 4  units, 251-300= 6 units, 301-350= 8 units, 351-400= 10 units.    [provider]  ipratropium-albuterol (DUONEB) 0.5-2.5 (3) MG/3ML SOLN Take 3 mLs by nebulization every 4 (four) hours as needed (wheezing).     [provider]  LEVEMIR FLEXTOUCH 100 UNIT/ML FlexPen Inject 10 Units into the skin at bedtime. 07/13/19   [provider]  lisinopril (ZESTRIL) 5 MG tablet Take 1 tablet (5 mg total) by mouth daily. 07/13/19   Allie Bossier, MD  Multiple Vitamin (MULTIVITAMIN WITH MINERALS) TABS tablet Take 1 tablet by mouth daily. 07/13/19   Allie Bossier, MD  Nutritional Supplements (FEEDING SUPPLEMENT, OSMOLITE 1.5 CAL,) LIQD Place 1,000 mLs into feeding tube continuous. Patient not taking: Reported on 07/17/2019 07/12/19   Allie Bossier, MD  Olopatadine HCl 0.2 % SOLN Place 1 drop into both eyes daily.    [provider]  ondansetron (ZOFRAN-ODT) 4 MG disintegrating tablet Take 4 mg by mouth every 6 (six) hours as needed for nausea or vomiting.    [provider]  polyethylene glycol (MIRALAX / GLYCOLAX) 17 g packet Take 17 g by mouth daily. 07/18/19   Candee Furbish, MD  scopolamine (TRANSDERM-SCOP) 1 MG/3DAYS Place 1 patch (1.5 mg total) onto the skin every 3 (three) days. 07/12/19   Allie Bossier, MD  sertraline (ZOLOFT) 100 MG tablet Place 1 tablet (100 mg total) into feeding tube at bedtime. 07/12/19   Allie Bossier, MD  simethicone (MYLICON) 80 MG chewable tablet Place 1 tablet (80 mg total) into feeding tube every 8 (eight) hours as needed for flatulence. 07/12/19   Allie Bossier, MD    Physical Exam: Vitals:   10/07/19 1730 10/07/19 1754 10/07/19 1800 10/07/19 2028  BP: 106/83  122/82 (!) 144/89  Pulse: 82 94 77 (!) 56  Resp: 12 16 13 19   Temp:      TempSrc:      SpO2: 100% 93% 100% 98%  Weight:      Height:        Constitutional: NAD, calm, comfortable Eyes: PERRL, lids and conjunctivae normal ENMT: Mucous membranes are moist.  Posterior pharynx clear of any exudate or lesions.Normal dentition.  Neck: Trach in place Respiratory: clear to auscultation bilaterally, no wheezing, no crackles. Normal respiratory effort. No accessory muscle use.  Cardiovascular: Regular rate and rhythm, no murmurs / rubs / gallops. No extremity edema. 2+ pedal pulses. No carotid bruits.  Abdomen: no tenderness, no masses palpated. No hepatosplenomegaly. Bowel sounds positive.  Musculoskeletal: no clubbing / cyanosis. No joint deformity upper and lower extremities. Good ROM, no contractures. Normal muscle tone.  Skin: no rashes, lesions, ulcers. No induration Neurologic: Quadriplegic Psychiatric: opens eyes to voice, not really responding otherwise.   Labs on Admission: I have personally reviewed following  labs and imaging studies  CBC: Recent Labs  Lab 10/07/19 1425  WBC 19.2*  NEUTROABS 13.7*  HGB 12.1*  HCT 36.0*  MCV 72.7*  PLT 277   Basic Metabolic Panel: Recent Labs  Lab 10/07/19 1425  NA 133*  K 3.1*  CL 107  CO2 15*  GLUCOSE 155*  BUN 25*  CREATININE 0.87  CALCIUM 8.4*   GFR: Estimated Creatinine Clearance: 72.3 mL/min (by C-G formula based on SCr of 0.87 mg/dL). Liver Function Tests: Recent Labs  Lab 10/07/19 1425  AST 13*  ALT 15  ALKPHOS 67  BILITOT 1.1  PROT 7.1  ALBUMIN 2.8*   Recent Labs  Lab 10/07/19 1425  LIPASE 27   No results for input(s): AMMONIA in the last 168 hours. Coagulation Profile: No results for input(s): INR, PROTIME in the last 168 hours. Cardiac Enzymes: No results for input(s): CKTOTAL, CKMB, CKMBINDEX, TROPONINI in the last 168 hours. BNP (last 3 results) No results for input(s): PROBNP in the last 8760 hours. HbA1C: No results for input(s): HGBA1C in the last 72 hours. CBG: Recent Labs  Lab 10/07/19 2026  GLUCAP 77   Lipid Profile: No results for input(s): CHOL, HDL, LDLCALC, TRIG, CHOLHDL, LDLDIRECT in the last 72 hours. Thyroid Function Tests: No results  for input(s): TSH, T4TOTAL, FREET4, T3FREE, THYROIDAB in the last 72 hours. Anemia Panel: No results for input(s): VITAMINB12, FOLATE, FERRITIN, TIBC, IRON, RETICCTPCT in the last 72 hours. Urine analysis:    Component Value Date/Time   COLORURINE AMBER (A) 10/07/2019 1445   APPEARANCEUR CLOUDY (A) 10/07/2019 1445   LABSPEC 1.015 10/07/2019 1445   PHURINE 5.0 10/07/2019 1445   GLUCOSEU NEGATIVE 10/07/2019 1445   HGBUR LARGE (A) 10/07/2019 1445   BILIRUBINUR NEGATIVE 10/07/2019 1445   KETONESUR NEGATIVE 10/07/2019 1445   PROTEINUR 100 (A) 10/07/2019 1445   NITRITE NEGATIVE 10/07/2019 1445   LEUKOCYTESUR LARGE (A) 10/07/2019 1445    Radiological Exams on Admission: CT ABDOMEN PELVIS W CONTRAST  Result Date: 10/07/2019 CLINICAL DATA:  Abdominal distention EXAM: CT ABDOMEN AND PELVIS WITH CONTRAST TECHNIQUE: Multidetector CT imaging of the abdomen and pelvis was performed using the standard protocol following bolus administration of intravenous contrast. CONTRAST:  164mL OMNIPAQUE IOHEXOL 300 MG/ML  SOLN COMPARISON:  07/06/2019 FINDINGS: Lower chest: Small bilateral pleural effusions with basilar consolidation or atelectasis, greater on the right. Hepatobiliary: No focal liver abnormality is seen. No gallstones, gallbladder wall thickening, or biliary dilatation. Pancreas: Unremarkable. No pancreatic ductal dilatation or surrounding inflammatory changes. Spleen: Normal in size without focal abnormality. Adrenals/Urinary Tract: No adrenal gland nodules. Severe atrophy and scarring of the right kidney. Calcification at the level of the mid right ureter may be in the ureter or a phlebolith in the adjacent gonadal vein. Mild right hilar caliectasis, possibly chronic ureteropelvic junction obstruction. Moderate focal scarring of the left kidney. Large left renal cyst. Bladder is decompressed with a Foley catheter. Stomach/Bowel: The colon is diffusely gas distended with small amount of fluid. Contrast  material is present in the rectum. Changes likely represent adynamic ileus. The appendix is normal. Small bowel are mostly decompressed. A percutaneous gastrostomy tube is present in the stomach. Vascular/Lymphatic: Aortic atherosclerosis. No enlarged abdominal or pelvic lymph nodes. Reproductive: Prostate gland is not enlarged. Other: No free air or free fluid in the abdomen. Abdominal wall musculature is atrophic. Musculoskeletal: Degenerative changes in the spine. Benign-appearing sclerosis in the inter trochanteric left femur. IMPRESSION: 1. Diffusely gas distended colon with small amount of fluid. Changes  likely represent adynamic ileus. 2. Small bilateral pleural effusions with basilar consolidation or atelectasis, greater on the right. 3. Severe atrophy and scarring of the right kidney. 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 Moderate focal scarring of the left kidney. Large left renal cyst. 4. Aortic atherosclerosis. 5. Percutaneous gastrostomy tube in the stomach. Aortic Atherosclerosis (ICD10-I70.0). Electronically Signed   By: Lucienne Capers M.D.   On: 10/07/2019 19:30   DG Chest Portable 1 View  Result Date: 10/07/2019 CLINICAL DATA:  Altered mental status. History of respiratory failure. Tracheostomy. EXAM: PORTABLE CHEST 1 VIEW COMPARISON:  07/16/2019. FINDINGS: Tracheostomy tube in stable position. Stable cardiomegaly. Low lung volumes with bibasilar atelectasis and infiltrates. Slight improvement in aeration of the lung basis from prior exam. No prominent pleural effusion. No pneumothorax. IMPRESSION: 1.  Tracheostomy tube in stable position. 2. Low lung volumes with bibasilar atelectasis and infiltrates again noted. Slight improvement in aeration of the lung bases from prior exam. Electronically Signed   By: Central   On: 10/07/2019 14:11    EKG: Independently reviewed.  Assessment/Plan Principal Problem:   Sepsis secondary to UTI [facility]) Active Problems:   Functional  quadriplegia (HCC)   Chronic respiratory failure (HCC)   HTN (hypertension)   DM2 (diabetes mellitus, type 2) (HCC)   AMS (altered mental status)   Ventilator dependence ([place])   History of ESBL Klebsiella pneumoniae infection   History of MRSA infection   Adynamic ileus ([place])   Sacral decubitus ulcer    1. Sepsis secondary to UTI - 1. Pt with sepsis, apparent urinary source. 2. Sepsis pathway 3. merrem and vanc - pt with h/o ESBL, K.Pneumo UTIs in June and July, MRSA on 1/2 BCx in June 4. BCx, UCx 5. Tele monitor 6. IVF: 2.5L bolus LR in ED, then LR at 125 cc/hr 7. Foley in place at this time: 1. In June apparently able to urinate without use of foley despite h/o quadriplegia according to my H+P at that time... 2. Adynamic ileus - 1. EDP called Dr. Kae Heller with gen surg, she said to call GI 2. NPO 3. IVF as above 4. Consult GI - sent secure chat message to Dr. Benson Norway for AM consult. 5. Stomach looks pretty decompressed on CT scan, dont think we need to try and suction the PEG tube or anything right now. 3. Chronic vent dependence - 1. PCCM consult for vent management, currently on baseline settings. 4. Sacral Decubitus - 1. Wound care consult 2. Doesn't appear grossly infected, not calling nec fash or anything on CT scan. 5. AMS - encephalopathy secondary to sepsis 6. DM2 - 1. Sensitive SSI Q4H while NPO 7. HTN - 1. Holding BP meds for the moment in setting of sepsis and initial hypotension in ED 8. Functional Quadriplegia - 1. Chronic and baseline 9. Chronic pain - 1. Convert norco to IV morphine PRN 10. Chronic anxiety - 1. Convert klonapin to IV ativan PRN  DVT prophylaxis: Lovenox Code Status: Full Family Communication: No family in room Disposition Plan: [facility] after admit Consults called: EDP calling PCCM, message sent to Dr. Benson Norway for AM GI consult at Dr. Kae Heller rec. Admission status: Admit to inpatient  Severity of Illness: The appropriate patient status for  this patient is INPATIENT. Inpatient status is judged to be reasonable and necessary in order to provide the required intensity of service to ensure the patient's safety. The patient's presenting symptoms, physical exam findings, and initial radiographic and laboratory data in the context of their chronic comorbidities  is felt to place them at high risk for further clinical deterioration. Furthermore, it is not anticipated that the patient will be medically stable for discharge from the hospital within 2 midnights of admission. The following factors support the patient status of inpatient.   IP status due to sepsis with AMS, initial hypotension, h/o MDROs.   * I certify that at the point of admission it is my clinical judgment that the patient will require inpatient hospital care spanning beyond 2 midnights from the point of admission due to high intensity of service, high risk for further deterioration and high frequency of surveillance required.*    Tulsi Crossett M. DO [facility]  How to contact the [facility] Attending or Consulting provider [place] or covering provider during after hours [place], for this patient?  1. Check the care team in [facility] and look for a) attending/consulting TR[facility] provider listed and b) the [facility] team listed 2. Log into [email or web address]  Amion Physician Scheduling and messaging for groups and whole hospitals  On call and physician scheduling software for group practices, residents, hospitalists and other medical providers for call, clinic, rotation and shift schedules. OnCall Enterprise is a hospital-wide system for scheduling doctors and paging doctors on call. EasyPlot is for scientific plotting and data analysis.  [email or web address]  and use [place]'s universal password to access. If you do not have the password, please contact the hospital operator.  3. Locate the [facility] provider you are looking for under [facility] and page to a number that you can be directly  reached. 4. If you still have difficulty reaching the provider, please page the [facility] (Director on Call) for the Hospitalists listed on amion for assistance.  10/07/2019, 8:31 PM

## 2019-10-07 NOTE — Progress Notes (Addendum)
MD changed zosyn >> meropenem. Will dose meropenem 2g IV every 8 hours based on renal function.  Pharmacy Antibiotic Note  Luis Orr is a 69 y.o. male admitted on 10/07/2019 with sepsis possibly d/t cellulitis.  Pharmacy has been consulted for vancomycin dosing.  Patient presented with AMS and fever, Tmax 100.7. WBC 19.2, lactic acid 1.2. He has a sacral ulcer that does not have purulent drainage and is the suspected source of sepsis. Recent urine cultures (7/21) grew k.pneumo ESBL, no wound cultures available. Patient is quadriplegic and has a chronic foley and trach. He is in AKI with SCr 0.87 (baseline SCr is ~0.3-0.4).   Will dose vancomycin ~15mg /kg to avoid furthering kidney dysfunction.   Plan: Vancomycin 1,250mg  IV once Vancomycin 1000mg  IV every 24 hours.  Goal trough 15-20 mcg/mL. Zosyn 3.375g IV q8h (4 hour infusion).  Follow up with cultures, antibiotic de-escalation and LOT Monitor renal function and clinical progress Consider checking vancomycin trough to avoid supratherapeutic levels  Temp (24hrs), Avg:100.7 F (38.2 C), Min:100.7 F (38.2 C), Max:100.7 F (38.2 C)  No results for input(s): WBC, CREATININE, LATICACIDVEN, VANCOTROUGH, VANCOPEAK, VANCORANDOM, GENTTROUGH, GENTPEAK, GENTRANDOM, TOBRATROUGH, TOBRAPEAK, TOBRARND, AMIKACINPEAK, AMIKACINTROU, AMIKACIN in the last 168 hours.  CrCl cannot be calculated (Patient's most recent lab result is older than the maximum 21 days allowed.).    No Known Allergies  Antimicrobials this admission: Vancomycin 9/24>> Zosyn 9/24>>   Dose adjustments this admission: N/a  Microbiology results: 9/24 BCx: pending 9/24 UCx: pending 9/24 trach aspirate: pending  Thank you for allowing pharmacy to be a part of this patient's care.  Mercy Riding, PharmD PGY1 Acute Care Pharmacy Resident Please refer to [facility] for unit-specific pharmacist

## 2019-10-08 ENCOUNTER — Encounter (HOSPITAL_COMMUNITY)
Admission: EM | Disposition: A | Discharge status: Skilled Nursing Facility | Payer: Self-pay | Source: Skilled Nursing Facility | Attending: Internal Medicine

## 2019-10-08 ENCOUNTER — Inpatient Hospital Stay (HOSPITAL_COMMUNITY): Payer: Medicare Other | Admitting: Anesthesiology

## 2019-10-08 ENCOUNTER — Inpatient Hospital Stay (HOSPITAL_COMMUNITY): Payer: Medicare Other

## 2019-10-08 ENCOUNTER — Encounter (HOSPITAL_COMMUNITY): Payer: Self-pay | Admitting: Internal Medicine

## 2019-10-08 HISTORY — PX: CYSTOSCOPY WITH STENT PLACEMENT: SHX5790

## 2019-10-08 LAB — LACTIC ACID, PLASMA
Lactic Acid, Venous: 1.1 mmol/L (ref 0.5–1.9)
Lactic Acid, Venous: 0.7 mmol/L (ref 0.5–1.9)

## 2019-10-08 LAB — COMPREHENSIVE METABOLIC PANEL WITH GFR
ALT: 14 U/L (ref 0–44)
AST: 13 U/L — ABNORMAL LOW (ref 15–41)
Albumin: 2.9 g/dL — ABNORMAL LOW (ref 3.5–5.0)
Alkaline Phosphatase: 74 U/L (ref 38–126)
Anion gap: 12 (ref 5–15)
BUN: 16 mg/dL (ref 8–23)
CO2: 17 mmol/L — ABNORMAL LOW (ref 22–32)
Calcium: 8.7 mg/dL — ABNORMAL LOW (ref 8.9–10.3)
Chloride: 106 mmol/L (ref 98–111)
Creatinine, Ser: 0.52 mg/dL — ABNORMAL LOW (ref 0.61–1.24)
GFR calc Af Amer: >60 mL/min (ref >60)
GFR calc non Af Amer: >60 mL/min (ref >60)
Glucose, Bld: 102 mg/dL — ABNORMAL HIGH (ref 70–99)
Potassium: 3.1 mmol/L — ABNORMAL LOW (ref 3.5–5.1)
Sodium: 135 mmol/L (ref 135–145)
Total Bilirubin: 1.2 mg/dL (ref 0.3–1.2)
Total Protein: 7.2 g/dL (ref 6.5–8.1)

## 2019-10-08 LAB — CBC
HCT: 37.4 % — ABNORMAL LOW (ref 39.0–52.0)
HCT: 38.3 % — ABNORMAL LOW (ref 39.0–52.0)
Hemoglobin: 12.6 g/dL — ABNORMAL LOW (ref 13.0–17.0)
Hemoglobin: 13.1 g/dL (ref 13.0–17.0)
MCH: 24.1 pg — ABNORMAL LOW (ref 26.0–34.0)
MCH: 24.1 pg — ABNORMAL LOW (ref 26.0–34.0)
MCHC: 33.7 g/dL (ref 30.0–36.0)
MCHC: 34.2 g/dL (ref 30.0–36.0)
MCV: 71.5 fL — ABNORMAL LOW (ref 80.0–100.0)
MCV: 70.4 fL — ABNORMAL LOW (ref 80.0–100.0)
Platelets: 294 10*3/uL (ref 150–400)
Platelets: 314 10*3/uL (ref 150–400)
RBC: 5.23 MIL/uL (ref 4.22–5.81)
RBC: 5.44 MIL/uL (ref 4.22–5.81)
RDW: 15.8 % — ABNORMAL HIGH (ref 11.5–15.5)
RDW: 15.7 % — ABNORMAL HIGH (ref 11.5–15.5)
WBC: 21.6 10*3/uL — ABNORMAL HIGH (ref 4.0–10.5)
WBC: 21.7 10*3/uL — ABNORMAL HIGH (ref 4.0–10.5)
nRBC: 0 % (ref 0.0–0.2)
nRBC: 0 % (ref 0.0–0.2)

## 2019-10-08 LAB — CORTISOL-AM, BLOOD: Cortisol - AM: 22.8 ug/dL — ABNORMAL HIGH (ref 6.7–22.6)

## 2019-10-08 LAB — PROTIME-INR
INR: 1.4 — ABNORMAL HIGH (ref 0.8–1.2)
Prothrombin Time: 16.6 seconds — ABNORMAL HIGH (ref 11.4–15.2)

## 2019-10-08 LAB — HEMOGLOBIN A1C
Hgb A1c MFr Bld: 5.8 % — ABNORMAL HIGH (ref 4.8–5.6)
Mean Plasma Glucose: 119.76 mg/dL

## 2019-10-08 LAB — GLUCOSE, CAPILLARY
Glucose-Capillary: 227 mg/dL — ABNORMAL HIGH (ref 70–99)
Glucose-Capillary: 246 mg/dL — ABNORMAL HIGH (ref 70–99)

## 2019-10-08 LAB — CBG MONITORING, ED
Glucose-Capillary: 140 mg/dL — ABNORMAL HIGH (ref 70–99)
Glucose-Capillary: 170 mg/dL — ABNORMAL HIGH (ref 70–99)
Glucose-Capillary: 69 mg/dL — ABNORMAL LOW (ref 70–99)
Glucose-Capillary: 96 mg/dL (ref 70–99)

## 2019-10-08 LAB — MAGNESIUM: Magnesium: 1.3 mg/dL — ABNORMAL LOW (ref 1.7–2.4)

## 2019-10-08 LAB — PROCALCITONIN: Procalcitonin: 0.54 ng/mL

## 2019-10-08 LAB — HEPARIN LEVEL (UNFRACTIONATED)
Heparin Unfractionated: 0.64 IU/mL (ref 0.30–0.70)
Heparin Unfractionated: 0.29 IU/mL — ABNORMAL LOW (ref 0.30–0.70)

## 2019-10-08 SURGERY — CYSTOSCOPY, WITH STENT INSERTION
Anesthesia: General | Laterality: Right

## 2019-10-08 MED ORDER — PROPOFOL 10 MG/ML IV BOLUS
INTRAVENOUS | Status: AC
  Filled 2019-10-08: qty 20

## 2019-10-08 MED ORDER — SODIUM CHLORIDE 0.9 % IR SOLN
Status: DC | PRN
  Administered 2019-10-08: 3000 mL

## 2019-10-08 MED ORDER — CHLORHEXIDINE GLUCONATE CLOTH 2 % EX PADS
6.0000 | MEDICATED_PAD | Freq: Every day | CUTANEOUS | Status: DC
  Administered 2019-10-08 – 2019-10-09 (×2): 6 via TOPICAL

## 2019-10-08 MED ORDER — MAGNESIUM SULFATE 2 GM/50ML IV SOLN
2.0000 g | Freq: Once | INTRAVENOUS | Status: AC
  Administered 2019-10-08: 2 g via INTRAVENOUS
  Filled 2019-10-08: qty 50

## 2019-10-08 MED ORDER — FENTANYL CITRATE (PF) 100 MCG/2ML IJ SOLN
100.0000 ug | INTRAMUSCULAR | Status: DC | PRN
  Administered 2019-10-08 – 2019-10-11 (×8): 100 ug via INTRAVENOUS
  Filled 2019-10-08 (×8): qty 2

## 2019-10-08 MED ORDER — DEXTROSE-NACL 5-0.45 % IV SOLN: INTRAVENOUS | Status: DC

## 2019-10-08 MED ORDER — IOHEXOL 300 MG/ML  SOLN
INTRAMUSCULAR | Status: DC | PRN
  Administered 2019-10-08: 15 mL via URETHRAL

## 2019-10-08 MED ORDER — METOPROLOL TARTRATE 5 MG/5ML IV SOLN
INTRAVENOUS | Status: AC
  Administered 2019-10-08: 2.5 mg via INTRAVENOUS
  Filled 2019-10-08: qty 5

## 2019-10-08 MED ORDER — POTASSIUM CHLORIDE 10 MEQ/100ML IV SOLN: 10.0000 meq | INTRAVENOUS | Status: DC

## 2019-10-08 MED ORDER — PROPOFOL 10 MG/ML IV BOLUS
INTRAVENOUS | Status: DC | PRN
  Administered 2019-10-08 (×2): 20 mg via INTRAVENOUS

## 2019-10-08 MED ORDER — SODIUM CHLORIDE 0.9 % IV SOLN: INTRAVENOUS | Status: DC | PRN

## 2019-10-08 MED ORDER — METOCLOPRAMIDE HCL 5 MG/ML IJ SOLN
10.0000 mg | Freq: Four times a day (QID) | INTRAMUSCULAR | Status: DC
  Administered 2019-10-09 – 2019-10-11 (×11): 10 mg via INTRAVENOUS
  Filled 2019-10-08 (×11): qty 2

## 2019-10-08 MED ORDER — METOPROLOL TARTRATE 5 MG/5ML IV SOLN
2.5000 mg | Freq: Once | INTRAVENOUS | Status: AC
  Administered 2019-10-08: 2.5 mg via INTRAVENOUS

## 2019-10-08 MED ORDER — LORAZEPAM 2 MG/ML IJ SOLN
0.5000 mg | INTRAMUSCULAR | Status: DC | PRN
  Administered 2019-10-09 – 2019-10-10 (×2): 0.5 mg via INTRAVENOUS
  Filled 2019-10-08 (×2): qty 1

## 2019-10-08 MED ORDER — POTASSIUM CHLORIDE 10 MEQ/100ML IV SOLN
10.0000 meq | INTRAVENOUS | Status: AC
  Administered 2019-10-08: 10 meq via INTRAVENOUS
  Filled 2019-10-08 (×2): qty 100

## 2019-10-08 MED ORDER — METOPROLOL TARTRATE 5 MG/5ML IV SOLN
5.0000 mg | Freq: Four times a day (QID) | INTRAVENOUS | Status: DC | PRN
  Administered 2019-10-08 – 2019-10-09 (×3): 5 mg via INTRAVENOUS
  Filled 2019-10-08 (×3): qty 5

## 2019-10-08 MED ORDER — PHENYLEPHRINE HCL-NACL 10-0.9 MG/250ML-% IV SOLN
INTRAVENOUS | Status: DC | PRN
  Administered 2019-10-08: 75 ug/min via INTRAVENOUS

## 2019-10-08 MED ORDER — 0.9 % SODIUM CHLORIDE (POUR BTL) OPTIME
TOPICAL | Status: DC | PRN
  Administered 2019-10-08: 1000 mL

## 2019-10-08 SURGICAL SUPPLY — 18 items
BAG URINE DRAIN 2000ML AR STRL (UROLOGICAL SUPPLIES) ×2 IMPLANT
BAG URO CATCHER STRL LF (MISCELLANEOUS) ×2 IMPLANT
CATH FOLEY 2WAY SLVR  5CC 16FR (CATHETERS) ×1
CATH FOLEY 2WAY SLVR 5CC 16FR (CATHETERS) ×1 IMPLANT
CATH INTERMIT  6FR 70CM (CATHETERS) ×2 IMPLANT
GLOVE BIO SURGEON STRL SZ7.5 (GLOVE) ×2 IMPLANT
GOWN STRL REUS W/ TWL XL LVL3 (GOWN DISPOSABLE) ×2 IMPLANT
GOWN STRL REUS W/TWL XL LVL3 (GOWN DISPOSABLE) ×2
GUIDEWIRE ANG ZIPWIRE 038X150 (WIRE) ×2 IMPLANT
KIT TURNOVER KIT B (KITS) ×2 IMPLANT
NS IRRIG 1000ML POUR BTL (IV SOLUTION) ×2 IMPLANT
PACK CYSTO (CUSTOM PROCEDURE TRAY) ×2 IMPLANT
STENT URET 6FRX24 CONTOUR (STENTS) ×2 IMPLANT
SYPHON OMNI JUG (MISCELLANEOUS) ×2 IMPLANT
SYR 10ML LL (SYRINGE) ×2 IMPLANT
SYR 20ML ECCENTRIC (SYRINGE) ×2 IMPLANT
TOWEL GREEN STERILE FF (TOWEL DISPOSABLE) ×2 IMPLANT
WATER STERILE IRR 3000ML UROMA (IV SOLUTION) ×2 IMPLANT

## 2019-10-08 NOTE — OR Nursing (Signed)
Page CCM, return call from [place].  No new orders received, continue to monitor until CCM to see pt.

## 2019-10-08 NOTE — Progress Notes (Addendum)
NAME:  Luis Orr, MRN:  [id number], DOB:  Jan 16, 1950, LOS: 1 ADMISSION DATE:  10/07/2019, CONSULTATION DATE: 10/07/2019 REFERRING MD: Dr. 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  Patient is a resident of [facility].  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  Patient is a resident of [facility].  Patient presented to the emergency department with distended abdomen.  Also has a history of recurrent ESBL Klebsiella pneumoniae UTIs in June and July.  Also history of MRSA bacteremia in June.  Upon presentation to the emergency department patient found to be febrile with a temperature of 100.7, white blood cell count 19,000 initial blood pressure systolic in the 35T.  Patient was given 2.5 L of lactated Ringer's with improvement in his blood pressure into the 140s.  Urinalysis revealed blood, greater than 50 white blood cells and many bacteria.  CT scan of the abdomen revealed distended colon chronic right ureteral junction obstruction, atrophy of the right kidney.  Which is chronic.  Pulmonary was consulted for recommendations and ventilator management as he is chronically on the vent.  Past Medical History   Past Medical History:  Diagnosis Date  . Anemia   . Anxiety   . Chronic respiratory failure ([place])   . DM2 (diabetes mellitus, type 2) ([place])   . Functional quadriplegia (HCC)   . Hypertension   . Urine retention      Significant Hospital Events     Consults:  Pulmonary critical care Surgery  Procedures:    Significant Diagnostic Tests:    Micro Data:  COVID-19 negative Prior  history of ESBL Klebsiella, history of MRSA  Antimicrobials:  Meropenem plus vancomycin  Interim history/subjective:  Currently stable on full mechanical ventilation for Objective   Blood pressure 111/85, pulse 81, temperature (!) 100.7 F (38.2 C), temperature source Rectal, resp. rate (!) 21, height 5\' 6"  (1.676 m), weight 66.8 kg, SpO2 100 %.    Vent Mode: PRVC FiO2 (%):  [40 %] 40 % Set Rate:  [12 bmp] 12 bmp Vt Set:  [500 mL] 500 mL PEEP:  [5 cmH20] 5 cmH20 Plateau Pressure:  [19 cmH20-24 cmH20] 24 cmH20   Intake/Output Summary (Last 24 hours) at 10/08/2019 0177 Last data filed at 10/08/2019 0715 Gross per 24 hour  Intake 1218 ml  Output --  Net 1218 ml   Filed Weights   10/07/19 1400  Weight: 66.8 kg    Examination: General: Elderly male who opens eyes and nods to questions  HEENT: Bivona trach in place support Neuro: Nods CV: Heart sounds are distant PULM: Diminished throughout Vent pressure regulated volume control FIO2 40% PEEP 5 RATE 12+8 VT 500  GI: no bowel sounds, PEG noted, abdominal distention appreciated GU: Dirty urine extremities: Plus edema Skin: no rashes or lesions  Post or  Awake and follows some commands. In pain Bloody urine HSIR HR 100 Peg to suction   Resolved Hospital Problem list     Assessment & Plan:   Chronic hypoxemic respiratory failure requiring tracheostomy tube and chronic mechanical ventilatory support. High cervical, C3 C5 compression, functional quadriplegic Plan: Continue current ventilator settings Serial chest x-rays Wean FiO2 to keep sats greater than 92% Pulmonary will continue to follow daily  for ventilatory measures   Afib RVR post OR 10/09/19 Called to PACU  IVF Dilt and metoprolol iv Pain control with fentanyl Add ativan   Sepsis Hyponatremia Likely urinary tract infection Plan:  Per primary Currently hemodynamically stable Follow culture data    Abdominal distention Adynamic  ileus Plan: Surgery has been consulted May need decompression Surgery consulted. PEG tube in place   Type 2 diabetes CBG (last 3)  Recent Labs    10/07/19 2026 10/08/19 0009 10/08/19 0322  GLUCAP 77 69* 96   Right renal stone [post stent 10/08/19 Post stent Stent placed in or Per urology  Hypertension Chronic pain  Per primary  Labs   CBC: Recent Labs  Lab 10/07/19 1425 10/08/19 0257  WBC 19.2* 21.6*  NEUTROABS 13.7*  --   HGB 12.1* 12.6*  HCT 36.0* 37.4*  MCV 72.7* 71.5*  PLT 234 878    Basic Metabolic Panel: Recent Labs  Lab 10/07/19 1425 10/08/19 0257  NA 133* 135  K 3.1* 3.1*  CL 107 106  CO2 15* 17*  GLUCOSE 155* 102*  BUN 25* 16  CREATININE 0.87 0.52*  CALCIUM 8.4* 8.7*   GFR: Estimated Creatinine Clearance: 78.6 mL/min (A) (by C-G formula based on SCr of 0.52 mg/dL (L)). Recent Labs  Lab 10/07/19 1425 10/08/19 0257  PROCALCITON  --  0.54  WBC 19.2* 21.6*  LATICACIDVEN 1.2 1.1    Liver Function Tests: Recent Labs  Lab 10/07/19 1425 10/08/19 0257  AST 13* 13*  ALT 15 14  ALKPHOS 67 74  BILITOT 1.1 1.2  PROT 7.1 7.2  ALBUMIN 2.8* 2.9*   Recent Labs  Lab 10/07/19 1425  LIPASE 27   No results for input(s): AMMONIA in the last 168 hours.  ABG    Component Value Date/Time   PHART 7.432 07/07/2019 0104   PCO2ART 39.4 07/07/2019 0104   PO2ART 94 07/07/2019 0104   HCO3 26.2 07/07/2019 0104   TCO2 27 07/07/2019 0104   O2SAT 97.0 07/07/2019 0104     Coagulation Profile: Recent Labs  Lab 10/08/19 0257  INR 1.4*    Cardiac Enzymes: No results for input(s): CKTOTAL, CKMB, CKMBINDEX, TROPONINI in the last 168 hours.  HbA1C: Hgb A1c MFr Bld  Date/Time Value Ref Range Status  10/08/2019 02:57 AM 5.8 (H) 4.8 - 5.6 % Final    Comment:    (NOTE) Pre diabetes:          5.7%-6.4%  Diabetes:              >6.4%  Glycemic control for   <7.0% adults with diabetes   07/07/2019 04:00 AM 7.0 (H) 4.8 - 5.6 % Final     Comment:    (NOTE) Pre diabetes:          5.7%-6.4%  Diabetes:              >6.4%  Glycemic control for   <7.0% adults with diabetes     CBG: Recent Labs  Lab 10/07/19 2026 10/08/19 0009 10/08/19 0322  GLUCAP 77 69* 96     App cct 30 min   Richardson Landry Criss Bartles ACNP Acute Care Nurse Practitioner [place] Please consult [place] 10/08/2019, 9:05 AM

## 2019-10-08 NOTE — ED Notes (Signed)
Hold patient in ED until anesthesia calls for patient per OR

## 2019-10-08 NOTE — Progress Notes (Signed)
RT told charge nurse to order extra trach and inner cannulas.

## 2019-10-08 NOTE — OR Nursing (Signed)
CBC, BMET, Ptt, lactic acid drawn by Ronne Binning, RN in PACU.

## 2019-10-08 NOTE — Progress Notes (Signed)
  Acute abdomen needs source control to get him better and keep him from decompensating further  No respiratory preclusion for surgery from my perspective

## 2019-10-08 NOTE — ED Notes (Signed)
OR called for patient, advised to bring to short stay bay 37.

## 2019-10-08 NOTE — OR Nursing (Signed)
Orders received from Dr. Tyrell Antonio to give metoprolol 2.5 mg Ivx1, Lr 500 cc bolus x1, obtain CBC and page CCM.

## 2019-10-08 NOTE — ED Notes (Signed)
Dr. Tyrell Antonio notified of pt's rhythm and  HR

## 2019-10-08 NOTE — OR Nursing (Signed)
Dr. Letha Cape at bedside.  No new orders

## 2019-10-08 NOTE — Progress Notes (Signed)
[facility] Progress Note Patient Name: Luis Orr DOB: 18-Jun-1950 MRN: [id number]   Date of Service  10/08/2019  HPI/Events of Note  26 M quadriplegic, chronic vent dependence, recurrent UTI with MDRO brought in from [facility] septic. CT abdomen with distended colon and right ureteral junction obstruction. Underwent cystoscopy and ureteral stent placement.  Patient seen on return from PACU on diltiazem drip 15 for new onset afib, also on heparin drip and D5 0.45% at 100/hr  BP 122/96  HR 100-122  eICU Interventions  Repeat BMP tonight as last K 3.1 and will correct electrolytes and change fluids accordingly. Glucose 245 on sliding scale insulin, will likely need to come off D5 containing fluids        Judd Lien 10/08/2019, 11:11 PM

## 2019-10-08 NOTE — Consult Note (Signed)
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  He has been treated for sepsis.  Leukocytosis is worsening and he continues to have atrial fibrillation and decreased responsiveness.  CT scan showed a possibly chronic right ureteral obstructive process.  Looks like he has a proximal right ureteral stone with some upstream hydronephrosis into a poorly functioning atrophic right kidney.  He also has an ileus that General surgery is following.  He is unable to provide any history.  I was unable to reach his wife as well.  Past Medical History:  Diagnosis Date  . Anemia   . Anxiety   . Chronic respiratory failure ([place])   . DM2 (diabetes mellitus, type 2) ([place])   . Functional quadriplegia ([facility])   . Hypertension   . Urine retention    Past Surgical History:  Procedure Laterality Date  . PEG PLACEMENT    . ROTATOR CUFF REPAIR    . TRACHEOSTOMY      Home Medications:  (Not in a hospital admission)  Allergies:  Allergies  Allergen Reactions  . Adhesive [Tape] Rash    No family history on file. Social History:  reports that he has never smoked. He has never used smokeless tobacco. No history on file for alcohol use and drug use.  ROS: A complete review of systems was performed.  All systems are negative except for pertinent findings as noted. ROS   Physical Exam:  Vital signs in last 24 hours: Temp:  [98.6 F (37 C)-99.6 F (37.6 C)] 98.6 F (37 C) (09/25 1200) Pulse Rate:  [37-167] 77 (09/25 1400) Resp:  [12-31] 20 (09/25 1400) BP: (83-144)/(58-90) 121/71 (09/25 1400) SpO2:  [90 %-100 %] 99 % (09/25 1400) FiO2 (%):  [40 %] 40 % (09/25  1400) General: Disoriented, does not follow commands HEENT: Normocephalic, atraumatic Neck: No JVD or lymphadenopathy Cardiovascular: Irregular rate and rhythm Lungs: Regular rate and effort, on a trach/vent Abdomen: Soft, nontender, distended, no abdominal masses Back: No CVA tenderness Extremities: No edema Neurologic: Grossly intact  Laboratory Data:  Results for orders placed or performed during the hospital encounter of 10/07/19 (from the past 24 hour(s))  POC CBG, ED     Status: None   Collection Time: 10/07/19  8:26 PM  Result Value Ref Range   Glucose-Capillary 77 70 - 99 mg/dL  CBG monitoring, ED     Status: Abnormal   Collection Time: 10/08/19 12:09 AM  Result Value Ref Range   Glucose-Capillary 69 (L) 70 - 99 mg/dL  Lactic acid, plasma     Status: None   Collection Time: 10/08/19  2:57 AM  Result Value Ref Range   Lactic Acid, Venous 1.1 0.5 - 1.9 mmol/L  Hemoglobin A1c     Status: Abnormal   Collection Time: 10/08/19  2:57 AM  Result Value Ref Range   Hgb A1c MFr Bld 5.8 (H) 4.8 - 5.6 %   Mean Plasma Glucose 119.76 mg/dL  Protime-INR     Status: Abnormal   Collection Time: 10/08/19  2:57 AM  Result Value Ref Range   Prothrombin Time 16.6 (H) 11.4 - 15.2 seconds   INR 1.4 (H) 0.8 - 1.2  Cortisol-am, blood     Status: Abnormal   Collection Time: 10/08/19  2:57 AM  Result Value Ref Range   Cortisol - AM 22.8 (H) 6.7 - 22.6 ug/dL  Procalcitonin     Status: None   Collection Time: 10/08/19  2:57 AM  Result Value Ref Range   Procalcitonin 0.54 ng/mL  CBC     Status: Abnormal   Collection Time: 10/08/19  2:57 AM  Result Value Ref Range   WBC 21.6 (H) 4.0 - 10.5 K/uL   RBC 5.23 4.22 - 5.81 MIL/uL   Hemoglobin 12.6 (L) 13.0 - 17.0 g/dL   HCT 37.4 (L) 39 - 52 %   MCV 71.5 (L) 80.0 - 100.0 fL   MCH 24.1 (L) 26.0 - 34.0 pg   MCHC 33.7 30.0 - 36.0 g/dL   RDW 15.8 (H) 11.5 - 15.5 %   Platelets 294 150 - 400 K/uL   nRBC 0.0 0.0 - 0.2 %  Comprehensive metabolic  panel     Status: Abnormal   Collection Time: 10/08/19  2:57 AM  Result Value Ref Range   Sodium 135 135 - 145 mmol/L   Potassium 3.1 (L) 3.5 - 5.1 mmol/L   Chloride 106 98 - 111 mmol/L   CO2 17 (L) 22 - 32 mmol/L   Glucose, Bld 102 (H) 70 - 99 mg/dL   BUN 16 8 - 23 mg/dL   Creatinine, Ser 0.52 (L) 0.61 - 1.24 mg/dL   Calcium 8.7 (L) 8.9 - 10.3 mg/dL   Total Protein 7.2 6.5 - 8.1 g/dL   Albumin 2.9 (L) 3.5 - 5.0 g/dL   AST 13 (L) 15 - 41 U/L   ALT 14 0 - 44 U/L   Alkaline Phosphatase 74 38 - 126 U/L   Total Bilirubin 1.2 0.3 - 1.2 mg/dL   GFR calc non Af Amer >60 >60 mL/min   GFR calc Af Amer >60 >60 mL/min   Anion gap 12 5 - 15  Heparin level (unfractionated)     Status: None   Collection Time: 10/08/19  2:57 AM  Result Value Ref Range   Heparin Unfractionated 0.64 0.30 - 0.70 IU/mL  Magnesium     Status: Abnormal   Collection Time: 10/08/19  2:57 AM  Result Value Ref Range   Magnesium 1.3 (L) 1.7 - 2.4 mg/dL  CBG monitoring, ED     Status: None   Collection Time: 10/08/19  3:22 AM  Result Value Ref Range   Glucose-Capillary 96 70 - 99 mg/dL  CBG monitoring, ED     Status: Abnormal   Collection Time: 10/08/19  9:37 AM  Result Value Ref Range   Glucose-Capillary 140 (H) 70 - 99 mg/dL   Comment 1 Notify RN    Comment 2 Document in Chart   CBG monitoring, ED     Status: Abnormal   Collection Time: 10/08/19 12:53 PM  Result Value Ref Range   Glucose-Capillary 170 (H) 70 - 99 mg/dL  Heparin level (unfractionated)     Status: Abnormal   Collection Time: 10/08/19  1:01 PM  Result Value Ref Range   Heparin Unfractionated 0.29 (L) 0.30 - 0.70 IU/mL   Recent Results (from the past 240 hour(s))  Blood culture (routine x 2)     Status: None (Preliminary result)   Collection Time: 10/07/19  1:32 PM   Specimen: BLOOD  Result Value Ref Range Status   Specimen Description BLOOD LEFT ANTECUBITAL  Final   Special Requests   Final    AEROBIC BOTTLE ONLY Blood Culture results may  not be  optimal due to an inadequate volume of blood received in culture bottles   Culture   Final    NO GROWTH < 24 HOURS Performed at [facility] [address] [place], [place] [address]    Report Status PENDING  Incomplete  Blood culture (routine x 2)     Status: None (Preliminary result)   Collection Time: 10/07/19  1:37 PM   Specimen: BLOOD  Result Value Ref Range Status   Specimen Description BLOOD BLOOD LEFT HAND  Final   Special Requests   Final    AEROBIC BOTTLE ONLY Blood Culture results may not be optimal due to an inadequate volume of blood received in culture bottles   Culture   Final    NO GROWTH < 24 HOURS Performed at [facility], [place] [address], [place], [place] [address]    Report Status PENDING  Incomplete  Respiratory Panel by RT PCR (Flu A&B, Covid) - Nasopharyngeal Swab     Status: None   Collection Time: 10/07/19  2:44 PM   Specimen: Nasopharyngeal Swab  Result Value Ref Range Status   SARS Coronavirus 2 by RT PCR NEGATIVE NEGATIVE Final    Comment: (NOTE) SARS-CoV-2 target nucleic acids are NOT DETECTED.  The SARS-CoV-2 RNA is generally detectable in upper respiratoy specimens during the acute phase of infection. The lowest concentration of SARS-CoV-2 viral copies this assay can detect is 131 copies/mL. A negative result does not preclude SARS-Cov-2 infection and should not be used as the sole basis for treatment or other patient management decisions. A negative result may occur with  improper specimen collection/handling, submission of specimen other than nasopharyngeal swab, presence of viral mutation(s) within the areas targeted by this assay, and inadequate number of viral copies (<131 copies/mL). A negative result must be combined with clinical observations, patient history, and epidemiological information. The expected result is Negative.  Fact Sheet for Patients:  PinkCheek.be  Fact Sheet for  Healthcare Providers:  GravelBags.it  This test is no t yet approved or cleared by the [place] FDA and  has been authorized for detection and/or diagnosis of SARS-CoV-2 by FDA under an Emergency Use Authorization (EUA). This EUA will remain  in effect (meaning this test can be used) for the duration of the COVID-19 declaration under Section 564(b)(1) of the Act, 21 U.S.C. section 360bbb-3(b)(1), unless the authorization is terminated or revoked sooner.     Influenza A by PCR NEGATIVE NEGATIVE Final   Influenza B by PCR NEGATIVE NEGATIVE Final    Comment: (NOTE) The Xpert Xpress SARS-CoV-2/FLU/RSV assay is intended as an aid in  the diagnosis of influenza from Nasopharyngeal swab specimens and  should not be used as a sole basis for treatment. Nasal washings and  aspirates are unacceptable for Xpert Xpress SARS-CoV-2/FLU/RSV  testing.  Fact Sheet for Patients: PinkCheek.be  Fact Sheet for Healthcare Providers: GravelBags.it  This test is not yet approved or cleared by the [place] FDA and  has been authorized for detection and/or diagnosis of SARS-CoV-2 by  FDA under an Emergency Use Authorization (EUA). This EUA will remain  in effect (meaning this test can be used) for the duration of the  Covid-19 declaration under Section 564(b)(1) of the Act, 21  U.S.C. section 360bbb-3(b)(1), unless the authorization is  terminated or revoked. Performed at [facility], [place] [address], [place], [place] [address]   Urine culture     Status: Abnormal (Preliminary result)   Collection Time: 10/07/19  2:45 PM   Specimen: In/Out Cath Urine  Result Value Ref Range Status   Specimen Description IN/OUT CATH URINE  Final   Special Requests   Final    NONE Performed at [facility], [address], [place], [place] [address]    Culture >=100,000 COLONIES/mL GRAM NEGATIVE RODS (A)  Final    Report Status PENDING  Incomplete   Creatinine: Recent Labs    10/07/19 1425 10/08/19 0257  CREATININE 0.87 0.52*   CT scan personally reviewed and is detailed in the history of present illness  Impression/Assessment:  Right ureteral calculus Right ureteral obstruction secondary to calculus Acquired right renal atrophy Sepsis secondary to UTI  Plan:  I think the best option at this point would be to proceed urgently to the operating room for cystoscopy with right ureteral stent placement.  The right kidney does look like it has decreased function.  However, hopefully placement of a stent will allow for decompression of the system and allow for better drainage of the infection source.  Of course, he does have multiple different medical problems and there could also be some other things contributing to his current status.  I think this is the most likely however.  I spoke with pulmonary critical care and it was felt that he was stable for the operating room for a ureteral stent placement.  Luis Redwood, Orr 10/08/2019, 2:58 PM

## 2019-10-08 NOTE — Progress Notes (Signed)
Subjective/Chief Complaint: Called back for more distended abdomen.    Objective: Vital signs in last 24 hours: Temp:  [98.6 F (37 C)-100.7 F (38.2 C)] 98.6 F (37 C) (09/25 1200) Pulse Rate:  [37-167] 65 (09/25 1200) Resp:  [12-31] 15 (09/25 1200) BP: (76-144)/(55-90) 130/69 (09/25 1200) SpO2:  [90 %-100 %] 97 % (09/25 1200) FiO2 (%):  [40 %] 40 % (09/25 1200) Weight:  [66.8 kg] 66.8 kg (09/24 1400)    Intake/Output from previous day: 09/24 0701 - 09/25 0700 In: 1118 [I.V.:1018; IV Piggyback:100] Out: -  Intake/Output this shift: Total I/O In: 1604.8 [I.V.:1504.8; IV Piggyback:100] Out: -   General appearance: alert GI: soft, distended  Lab Results:  Recent Labs    10/07/19 1425 10/08/19 0257  WBC 19.2* 21.6*  HGB 12.1* 12.6*  HCT 36.0* 37.4*  PLT 234 294   BMET Recent Labs    10/07/19 1425 10/08/19 0257  NA 133* 135  K 3.1* 3.1*  CL 107 106  CO2 15* 17*  GLUCOSE 155* 102*  BUN 25* 16  CREATININE 0.87 0.52*  CALCIUM 8.4* 8.7*   PT/INR Recent Labs    10/08/19 0257  LABPROT 16.6*  INR 1.4*   ABG No results for input(s): PHART, HCO3 in the last 72 hours.  Invalid input(s): PCO2, PO2  Studies/Results: DG Abd 1 View  Result Date: 10/08/2019 CLINICAL DATA:  Ileus EXAM: ABDOMEN - 1 VIEW COMPARISON:  Abdominal CT from yesterday FINDINGS: Diffuse gaseous distension of small and large bowel, reaching the rectum. No concerning mass effect or gas collection. Infiltrate at both lung bases. IMPRESSION: 1. Adynamic ileus pattern without change from CT yesterday. 2. Bilateral lower lobe infiltrate. Electronically Signed   By: Monte Fantasia M.D.   On: 10/08/2019 10:17   CT ABDOMEN PELVIS W CONTRAST  Result Date: 10/07/2019 CLINICAL DATA:  Abdominal distention EXAM: CT ABDOMEN AND PELVIS WITH CONTRAST TECHNIQUE: Multidetector CT imaging of the abdomen and pelvis was performed using the standard protocol following bolus administration of intravenous  contrast. CONTRAST:  17mL OMNIPAQUE IOHEXOL 300 MG/ML  SOLN COMPARISON:  07/06/2019 FINDINGS: Lower chest: Small bilateral pleural effusions with basilar consolidation or atelectasis, greater on the right. Hepatobiliary: No focal liver abnormality is seen. No gallstones, gallbladder wall thickening, or biliary dilatation. Pancreas: Unremarkable. No pancreatic ductal dilatation or surrounding inflammatory changes. Spleen: Normal in size without focal abnormality. Adrenals/Urinary Tract: No adrenal gland nodules. Severe atrophy and scarring of the right kidney. Calcification at the level of the mid right ureter may be in the ureter or a phlebolith in the adjacent gonadal vein. Mild right hilar caliectasis, possibly chronic ureteropelvic junction obstruction. Moderate focal scarring of the left kidney. Large left renal cyst. Bladder is decompressed with a Foley catheter. Stomach/Bowel: The colon is diffusely gas distended with small amount of fluid. Contrast material is present in the rectum. Changes likely represent adynamic ileus. The appendix is normal. Small bowel are mostly decompressed. A percutaneous gastrostomy tube is present in the stomach. Vascular/Lymphatic: Aortic atherosclerosis. No enlarged abdominal or pelvic lymph nodes. Reproductive: Prostate gland is not enlarged. Other: No free air or free fluid in the abdomen. Abdominal wall musculature is atrophic. Musculoskeletal: Degenerative changes in the spine. Benign-appearing sclerosis in the inter trochanteric left femur. IMPRESSION: 1. Diffusely gas distended colon with small amount of fluid. Changes likely represent adynamic ileus. 2. Small bilateral pleural effusions with basilar consolidation or atelectasis, greater on the right. 3. Severe atrophy and scarring of the right kidney. 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 Moderate focal scarring of the left kidney. Large left renal cyst. 4. Aortic atherosclerosis. 5. Percutaneous gastrostomy tube in the  stomach. Aortic Atherosclerosis (ICD10-I70.0). Electronically Signed   By: Lucienne Capers M.D.   On: 10/07/2019 19:30   DG Chest Portable 1 View  Result Date: 10/07/2019 CLINICAL DATA:  Altered mental status. History of respiratory failure. Tracheostomy. EXAM: PORTABLE CHEST 1 VIEW COMPARISON:  07/16/2019. FINDINGS: Tracheostomy tube in stable position. Stable cardiomegaly. Low lung volumes with bibasilar atelectasis and infiltrates. Slight improvement in aeration of the lung basis from prior exam. No prominent pleural effusion. No pneumothorax. IMPRESSION: 1.  Tracheostomy tube in stable position. 2. Low lung volumes with bibasilar atelectasis and infiltrates again noted. Slight improvement in aeration of the lung bases from prior exam. Electronically Signed   By: [place]   On: 10/07/2019 14:11    Anti-infectives: Anti-infectives (From admission, onward)   Start     Dose/Rate Route Frequency Ordered Stop   10/08/19 1600  vancomycin (VANCOCIN) IVPB 1000 mg/200 mL premix        1,000 mg 200 mL/hr over 60 Minutes Intravenous Every 24 hours 10/07/19 1539     10/07/19 2100  piperacillin-tazobactam (ZOSYN) IVPB 3.375 g  Status:  Discontinued        3.375 g 12.5 mL/hr over 240 Minutes Intravenous Every 8 hours 10/07/19 1539 10/07/19 2001   10/07/19 2100  meropenem (MERREM) 2 g in sodium chloride 0.9 % 100 mL IVPB        2 g 200 mL/hr over 30 Minutes Intravenous Every 8 hours 10/07/19 2010     10/07/19 1500  vancomycin (VANCOREADY) IVPB 1250 mg/250 mL        1,250 mg 166.7 mL/hr over 90 Minutes Intravenous  Once 10/07/19 1421 10/07/19 1819   10/07/19 1415  piperacillin-tazobactam (ZOSYN) IVPB 3.375 g        3.375 g 100 mL/hr over 30 Minutes Intravenous  Once 10/07/19 1418 10/07/19 1513      Assessment/Plan: Sepsis Chronic vent dependence Atrial fibrillation.  adynamic ileus  Urosepsis G tube to gravity. No evidence for obstruction Consider consult GI for potential  decompressive colonic tube or neostigmine. No role for surgical intervention.   Make sure electrolytes are corrected.   Will sign off.     LOS: 1 day    Stark Klein 10/08/2019

## 2019-10-08 NOTE — Consult Note (Signed)
WOC Nurse Consult Note: Reason for Consult: Sacral skin breakdown at the site of previously healed skin injury (scar tissue). Photo provided to EMR in ED on 10/07/19. Patient is wearing an adult brief. Wound type: Stage 2 pressure injury vs MASD Pressure Injury POA: Yes Measurement:Bedside RN to measure with first dressing placement. Wound KWI:OXBDZHG thickness scattered skin breakdown, pink, moist Drainage (amount, consistency, odor) scant serous Periwound:Previously healed full thickness skin injury in the periwound area (scar tissue) Dressing procedure/placement/frequency:  I will provide guidance for Nursing for topical care using an antimicrobial nonadherent dressing in a single layer as a wound contact layer and topping with dry gauze prior to covering with a silicone foam dressing. Additionally, I have placed an order for a mattress replacement with low air loss feature. Patient to be turned and repositioned from side to side and time in the supine position minimized while in house.  Bilateral Prevalon Boots are provided for heel pressure injury prevention.  [facility] nursing team will not follow, but will remain available to this patient, the nursing and medical teams.  Please re-consult if needed. Thanks, Maudie Flakes, MSN, RN, [place], Arther Abbott  Pager# [phone]

## 2019-10-08 NOTE — Progress Notes (Signed)
NAME:  Luis Orr, MRN:  [id number], DOB:  02-01-1950, LOS: 1 ADMISSION DATE:  10/07/2019, CONSULTATION DATE: 9/24 REFERRING MD: Dr. 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  Patient is a resident of [facility].  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  Patient is a resident of [facility].  Patient presented to the emergency department with distended abdomen.  Also has a history of recurrent ESBL Klebsiella pneumoniae UTIs in June and July.  Also history of MRSA bacteremia in June.  Upon presentation to the emergency department patient found to be febrile with a temperature of 100.7, white blood cell count 19,000 initial blood pressure systolic in the 09N.  Patient was given 2.5 L of lactated Ringer's with improvement in his blood pressure into the 140s.  Urinalysis revealed blood, greater than 50 white blood cells and many bacteria.  CT scan of the abdomen revealed distended colon chronic right ureteral junction obstruction, atrophy of the right kidney.  Which is chronic.  Pulmonary was consulted for recommendations and ventilator management as he is chronically on the vent.  Past Medical History   Past Medical History:  Diagnosis Date  . Anemia   . Anxiety   . Chronic respiratory failure ([place])   . DM2 (diabetes mellitus, type 2) ([place])   . Functional quadriplegia (HCC)   . Hypertension   . Urine retention      Significant Hospital Events   None  Consults:  PCCM Surgery  Procedures:  None  Significant Diagnostic Tests:    Micro Data:  COVID-19 negative Prior  history of ESBL Klebsiella, history of MRSA  Antimicrobials:  Meropenem Vancomycin  Interim history/subjective:  Patient is noncommunicative Appears comfortable on the vent  Objective   Blood pressure 130/69, pulse 65, temperature 98.6 F (37 C), temperature source Oral, resp. rate 15, height 5\' 6"  (1.676 m), weight 66.8 kg, SpO2 97 %.    Vent Mode: PRVC FiO2 (%):  [40 %] 40 % Set Rate:  [12 bmp] 12 bmp Vt Set:  [500 mL] 500 mL PEEP:  [5 cmH20] 5 cmH20 Plateau Pressure:  [19 cmH20-24 cmH20] 23 cmH20   Intake/Output Summary (Last 24 hours) at 10/08/2019 1228 Last data filed at 10/08/2019 1223 Gross per 24 hour  Intake 2379.75 ml  Output --  Net 2379.75 ml   Filed Weights   10/07/19 1400  Weight: 66.8 kg    Examination: General: Chronically ill-appearing, debilitated HENT: Tracheostomy tube in place Lungs: Clear breath sounds anteriorly Cardiovascular: S1-S2 appreciated Abdomen: Bowel sounds appreciated, distended Extremities: Dependent edema Neuro: Grimaces to pain GU:   Resolved Hospital Problem list     Assessment & Plan:  Chronic hypoxic respiratory failure requiring tracheostomy tube on chronic mechanical ventilatory support High cervical C3-C5 compression, functional quadriplegia -Continue full vent support -Pulmonary toileting  Sepsis Hyponatremia  Patient will be continued on broad-spectrum antibiotic -Follow cultures -Fluid resuscitation completed -Maintain MAP greater than 65  Abdominal distention Adynamic ileus -Surgery consulted -May require decompression  Other health problems include Type 2 diabetes -SSI while n.p.o.  Hypertension Chronic pain Sacral decubitus -Present on admission -Wound care consult   Best practice:  Diet: N.p.o. Pain/Anxiety/Delirium protocol (if indicated): Morphine as needed VAP protocol (  if indicated): In place DVT prophylaxis: Full anticoagulation for A. fib GI prophylaxis:  Glucose control:  SSI Mobility: Bedrest Code Status: Full code Family Communication: Per primary Disposition: Will need ICU bed with being on chronic ventilator  Labs   CBC: Recent Labs  Lab 10/07/19 1425 10/08/19 0257  WBC 19.2* 21.6*  NEUTROABS 13.7*  --   HGB 12.1* 12.6*  HCT 36.0* 37.4*  MCV 72.7* 71.5*  PLT 234 177    Basic Metabolic Panel: Recent Labs  Lab 10/07/19 1425 10/08/19 0257  NA 133* 135  K 3.1* 3.1*  CL 107 106  CO2 15* 17*  GLUCOSE 155* 102*  BUN 25* 16  CREATININE 0.87 0.52*  CALCIUM 8.4* 8.7*  MG  --  1.3*   GFR: Estimated Creatinine Clearance: 78.6 mL/min (A) (by C-G formula based on SCr of 0.52 mg/dL (L)). Recent Labs  Lab 10/07/19 1425 10/08/19 0257  PROCALCITON  --  0.54  WBC 19.2* 21.6*  LATICACIDVEN 1.2 1.1    Liver Function Tests: Recent Labs  Lab 10/07/19 1425 10/08/19 0257  AST 13* 13*  ALT 15 14  ALKPHOS 67 74  BILITOT 1.1 1.2  PROT 7.1 7.2  ALBUMIN 2.8* 2.9*   Recent Labs  Lab 10/07/19 1425  LIPASE 27   No results for input(s): AMMONIA in the last 168 hours.  ABG    Component Value Date/Time   PHART 7.432 07/07/2019 0104   PCO2ART 39.4 07/07/2019 0104   PO2ART 94 07/07/2019 0104   HCO3 26.2 07/07/2019 0104   TCO2 27 07/07/2019 0104   O2SAT 97.0 07/07/2019 0104     Coagulation Profile: Recent Labs  Lab 10/08/19 0257  INR 1.4*    Cardiac Enzymes: No results for input(s): CKTOTAL, CKMB, CKMBINDEX, TROPONINI in the last 168 hours.  HbA1C: Hgb A1c MFr Bld  Date/Time Value Ref Range Status  10/08/2019 02:57 AM 5.8 (H) 4.8 - 5.6 % Final    Comment:    (NOTE) Pre diabetes:          5.7%-6.4%  Diabetes:              >6.4%  Glycemic control for   <7.0% adults with diabetes   07/07/2019 04:00 AM 7.0 (H) 4.8 - 5.6 % Final    Comment:    (NOTE) Pre diabetes:          5.7%-6.4%  Diabetes:              >6.4%  Glycemic control for   <7.0% adults with diabetes     CBG: Recent Labs  Lab 10/07/19 2026  10/08/19 0009 10/08/19 0322 10/08/19 0937  GLUCAP 77 69* 96 140*    Review of Systems:   Unobtainable  Past Medical History  He,  has a past medical history of Anemia, Anxiety, Chronic respiratory failure ([place]), DM2 (diabetes mellitus, type 2) ([place]), Functional quadriplegia ([place]), Hypertension, and Urine retention.   Surgical History    Past Surgical History:  Procedure Laterality Date  . PEG PLACEMENT    . ROTATOR CUFF REPAIR    . TRACHEOSTOMY       Social History   reports that he has never smoked. He has never used smokeless tobacco.   Family History   His family history is not on file.   Allergies Allergies  Allergen Reactions  . Adhesive [Tape] Rash     Sherrilyn Rist, MD [facility] Pager: [phone]

## 2019-10-08 NOTE — Progress Notes (Addendum)
PROGRESS NOTE    Luis Orr  ACZ:[id number] DOB: 1950-09-11 DOA: 10/07/2019 PCP: Townsend Roger, MD   Brief Narrative: 69 year old with past medical history significant of chronic vent dependent, trach status, functional quadriplegia due to compressive myelopathy C3-C5, diabetes type 2 who presents from [facility] for further evaluation of adynamic ileus, altered mental status, patient becoming less responsive.  Patient presented febrile temperature 100.7, leukocytosis 19 K, initial blood pressure 76/55 improved to 144/80 2:09 0.5 L of LR for sepsis. CT abdomen and pelvis: Showed adynamic ileus with distended colon.  Chronic right UPJ obstruction, severe atrophy and scaring of right kidney. CCM has been consulted for vent management General surgery has been consulted for adynamic ileus  Assessment & Plan:   Principal Problem:   Sepsis secondary to UTI [facility]) Active Problems:   Functional quadriplegia (HCC)   Chronic respiratory failure (HCC)   HTN (hypertension)   DM2 (diabetes mellitus, type 2) ([place])   AMS (altered mental status)   Ventilator dependence ([place])   History of ESBL Klebsiella pneumoniae infection   History of MRSA infection   Adynamic ileus ([place])   Sacral decubitus ulcer   1-Sepsis; secondary to UTI secondary to chronic indwelling Foley catheter -Present febrile temperature 101.7, leukocytosis white count 19, tachycardic heart rate 126, tachypnea. -Source for infection urinary tract infection: UA with more than 50 white blood cell. -Continue with IV antibiotics, meropenem and vancomycin -Blood cultures: Urine culture: -Continue with IV fluids -CT with R chronic UPJ  Obstruction. Will discussed with urology.  -Urology recommend IV antibiotics.    2-Adynamic ileus: -General surgery following -Dr. Benson Norway to see patient today. -Correct electrolytes abnormalities. -Peg tube to gravity per Surgery.  -Plan to repeat KUB today.   3-Chronic respiratory  failure vent dependence; Appreciate CCM help.  Management per CCM.   4-Sacral decubitus: Wound care consulted.   A Fib RVR;  On Cardizem Gtt Added metoprolol for increase HR>  Addendum; Came to see patient at request of PACU;  Patient HR in the 130-140 A fib RVR; Plan for LR bolus, 500 cc. IV metoprolol one time dose ordered. Check CBC.  CCM to be contacted.   Hypomagnesemia; replete IV>   Acute metabolic encephalopathy: He presented with worsening MS.  He appears more alert, and following some command today.   Diabetes type 2 SSI  Hypertension: Hold blood pressure medication in the setting of sepsis and hypotension  Functional quadriplegia Support care.   Chronic pain; transition to IV morphine PRN. Resume norco when tolerating oral.   Anxiety; PRN ativan. Resume klonopin when taking oral.     Pressure Injury 07/07/19 Sacrum Mid Stage 3 -  Full thickness tissue loss. Subcutaneous fat may be visible but bone, tendon or muscle are NOT exposed. (Active)  07/07/19 2030  Location: Sacrum  Location Orientation: Mid  Staging: Stage 3 -  Full thickness tissue loss. Subcutaneous fat may be visible but bone, tendon or muscle are NOT exposed.  Wound Description (Comments):   Present on Admission: Yes     Pressure Injury 07/17/19 Leg Left Stage 2 -  Partial thickness loss of dermis presenting as a shallow open injury with a red, pink wound bed without slough. healing (Active)  07/17/19 0330  Location: Leg  Location Orientation: Left  Staging: Stage 2 -  Partial thickness loss of dermis presenting as a shallow open injury with a red, pink wound bed without slough.  Wound Description (Comments): healing  Present on Admission: Yes  Estimated body mass index is 23.77 kg/m as calculated from the following:   Height as of this encounter: 5\' 6"  (1.676 m).   Weight as of this encounter: 66.8 kg.   DVT prophylaxis: heparin gtt Code Status: Full  code Family Communication: wife updated.   Disposition Plan:  Status is: Inpatient  Remains inpatient appropriate because:IV treatments appropriate due to intensity of illness or inability to take PO   Dispo: The patient is from: LTAC, [facility]              Anticipated d/c is to: LTAC              Anticipated d/c date is: > 3 days              Patient currently is not medically stable to d/c.        Consultants:   GI  Surgery   CCM  Procedures:   None  Antimicrobials:    Subjective: Alert, move his head to answer no or yes to question.  Complaint of abdominal pain   Objective: Vitals:   10/08/19 0530 10/08/19 0600 10/08/19 0630 10/08/19 0700  BP: 110/80 114/85 109/87 111/85  Pulse: (!) 37 88 88 81  Resp: (!) 25 (!) 31 (!) 21 (!) 21  Temp:      TempSrc:      SpO2: 98% 100% 98% 100%  Weight:      Height:        Intake/Output Summary (Last 24 hours) at 10/08/2019 0730 Last data filed at 10/08/2019 0715 Gross per 24 hour  Intake 1218 ml  Output --  Net 1218 ml   Filed Weights   10/07/19 1400  Weight: 66.8 kg    Examination:  General exam: Appears calm and comfortable  Respiratory system: Clear to auscultation. Respiratory effort normal. Cardiovascular system: S1 & S2 heard IRR.  Gastrointestinal system: abdomen is very distended, no rigidity.  Central nervous system: Alert. Follow some command.  Extremities: trace edema.    Data Reviewed: I have personally reviewed following labs and imaging studies  CBC: Recent Labs  Lab 10/07/19 1425 10/08/19 0257  WBC 19.2* 21.6*  NEUTROABS 13.7*  --   HGB 12.1* 12.6*  HCT 36.0* 37.4*  MCV 72.7* 71.5*  PLT 234 564   Basic Metabolic Panel: Recent Labs  Lab 10/07/19 1425 10/08/19 0257  NA 133* 135  K 3.1* 3.1*  CL 107 106  CO2 15* 17*  GLUCOSE 155* 102*  BUN 25* 16  CREATININE 0.87 0.52*  CALCIUM 8.4* 8.7*   GFR: Estimated Creatinine Clearance: 78.6 mL/min (A) (by C-G formula based on  SCr of 0.52 mg/dL (L)). Liver Function Tests: Recent Labs  Lab 10/07/19 1425 10/08/19 0257  AST 13* 13*  ALT 15 14  ALKPHOS 67 74  BILITOT 1.1 1.2  PROT 7.1 7.2  ALBUMIN 2.8* 2.9*   Recent Labs  Lab 10/07/19 1425  LIPASE 27   No results for input(s): AMMONIA in the last 168 hours. Coagulation Profile: Recent Labs  Lab 10/08/19 0257  INR 1.4*   Cardiac Enzymes: No results for input(s): CKTOTAL, CKMB, CKMBINDEX, TROPONINI in the last 168 hours. BNP (last 3 results) No results for input(s): PROBNP in the last 8760 hours. HbA1C: Recent Labs    10/08/19 0257  HGBA1C 5.8*   CBG: Recent Labs  Lab 10/07/19 2026 10/08/19 0009 10/08/19 0322  GLUCAP 77 69* 96   Lipid Profile: No results for input(s): CHOL, HDL, LDLCALC, TRIG, CHOLHDL, LDLDIRECT  in the last 72 hours. Thyroid Function Tests: No results for input(s): TSH, T4TOTAL, FREET4, T3FREE, THYROIDAB in the last 72 hours. Anemia Panel: No results for input(s): VITAMINB12, FOLATE, FERRITIN, TIBC, IRON, RETICCTPCT in the last 72 hours. Sepsis Labs: Recent Labs  Lab 10/07/19 1425 10/08/19 0257  PROCALCITON  --  0.54  LATICACIDVEN 1.2 1.1    Recent Results (from the past 240 hour(s))  Respiratory Panel by RT PCR (Flu A&B, Covid) - Nasopharyngeal Swab     Status: None   Collection Time: 10/07/19  2:44 PM   Specimen: Nasopharyngeal Swab  Result Value Ref Range Status   SARS Coronavirus 2 by RT PCR NEGATIVE NEGATIVE Final    Comment: (NOTE) SARS-CoV-2 target nucleic acids are NOT DETECTED.  The SARS-CoV-2 RNA is generally detectable in upper respiratoy specimens during the acute phase of infection. The lowest concentration of SARS-CoV-2 viral copies this assay can detect is 131 copies/mL. A negative result does not preclude SARS-Cov-2 infection and should not be used as the sole basis for treatment or other patient management decisions. A negative result may occur with  improper specimen collection/handling,  submission of specimen other than nasopharyngeal swab, presence of viral mutation(s) within the areas targeted by this assay, and inadequate number of viral copies (<131 copies/mL). A negative result must be combined with clinical observations, patient history, and epidemiological information. The expected result is Negative.  Fact Sheet for Patients:  PinkCheek.be  Fact Sheet for Healthcare Providers:  GravelBags.it  This test is no t yet approved or cleared by the [place] FDA and  has been authorized for detection and/or diagnosis of SARS-CoV-2 by FDA under an Emergency Use Authorization (EUA). This EUA will remain  in effect (meaning this test can be used) for the duration of the COVID-19 declaration under Section 564(b)(1) of the Act, 21 U.S.C. section 360bbb-3(b)(1), unless the authorization is terminated or revoked sooner.     Influenza A by PCR NEGATIVE NEGATIVE Final   Influenza B by PCR NEGATIVE NEGATIVE Final    Comment: (NOTE) The Xpert Xpress SARS-CoV-2/FLU/RSV assay is intended as an aid in  the diagnosis of influenza from Nasopharyngeal swab specimens and  should not be used as a sole basis for treatment. Nasal washings and  aspirates are unacceptable for Xpert Xpress SARS-CoV-2/FLU/RSV  testing.  Fact Sheet for Patients: PinkCheek.be  Fact Sheet for Healthcare Providers: GravelBags.it  This test is not yet approved or cleared by the [place] FDA and  has been authorized for detection and/or diagnosis of SARS-CoV-2 by  FDA under an Emergency Use Authorization (EUA). This EUA will remain  in effect (meaning this test can be used) for the duration of the  Covid-19 declaration under Section 564(b)(1) of the Act, 21  U.S.C. section 360bbb-3(b)(1), unless the authorization is  terminated or revoked. Performed at [facility], [place] [address], [place], [place] [address]          Radiology Studies: CT ABDOMEN PELVIS W CONTRAST  Result Date: 10/07/2019 CLINICAL DATA:  Abdominal distention EXAM: CT ABDOMEN AND PELVIS WITH CONTRAST TECHNIQUE: Multidetector CT imaging of the abdomen and pelvis was performed using the standard protocol following bolus administration of intravenous contrast. CONTRAST:  163mL OMNIPAQUE IOHEXOL 300 MG/ML  SOLN COMPARISON:  07/06/2019 FINDINGS: Lower chest: Small bilateral pleural effusions with basilar consolidation or atelectasis, greater on the right. Hepatobiliary: No focal liver abnormality is seen. No gallstones, gallbladder wall thickening, or biliary dilatation. Pancreas: Unremarkable. No pancreatic ductal dilatation  or surrounding inflammatory changes. Spleen: Normal in size without focal abnormality. Adrenals/Urinary Tract: No adrenal gland nodules. Severe atrophy and scarring of the right kidney. Calcification at the level of the mid right ureter may be in the ureter or a phlebolith in the adjacent gonadal vein. Mild right hilar caliectasis, possibly chronic ureteropelvic junction obstruction. Moderate focal scarring of the left kidney. Large left renal cyst. Bladder is decompressed with a Foley catheter. Stomach/Bowel: The colon is diffusely gas distended with small amount of fluid. Contrast material is present in the rectum. Changes likely represent adynamic ileus. The appendix is normal. Small bowel are mostly decompressed. A percutaneous gastrostomy tube is present in the stomach. Vascular/Lymphatic: Aortic atherosclerosis. No enlarged abdominal or pelvic lymph nodes. Reproductive: Prostate gland is not enlarged. Other: No free air or free fluid in the abdomen. Abdominal wall musculature is atrophic. Musculoskeletal: Degenerative changes in the spine. Benign-appearing sclerosis in the inter trochanteric left femur. IMPRESSION: 1. Diffusely gas distended colon with small amount of fluid. Changes  likely represent adynamic ileus. 2. Small bilateral pleural effusions with basilar consolidation or atelectasis, greater on the right. 3. Severe atrophy and scarring of the right kidney. 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 Moderate focal scarring of the left kidney. Large left renal cyst. 4. Aortic atherosclerosis. 5. Percutaneous gastrostomy tube in the stomach. Aortic Atherosclerosis (ICD10-I70.0). Electronically Signed   By: Lucienne Capers M.D.   On: 10/07/2019 19:30   DG Chest Portable 1 View  Result Date: 10/07/2019 CLINICAL DATA:  Altered mental status. History of respiratory failure. Tracheostomy. EXAM: PORTABLE CHEST 1 VIEW COMPARISON:  07/16/2019. FINDINGS: Tracheostomy tube in stable position. Stable cardiomegaly. Low lung volumes with bibasilar atelectasis and infiltrates. Slight improvement in aeration of the lung basis from prior exam. No prominent pleural effusion. No pneumothorax. IMPRESSION: 1.  Tracheostomy tube in stable position. 2. Low lung volumes with bibasilar atelectasis and infiltrates again noted. Slight improvement in aeration of the lung bases from prior exam. Electronically Signed   By: [place]   On: 10/07/2019 14:11        Scheduled Meds: . chlorhexidine  5 mL Mouth Rinse Q12H  . insulin aspart  0-9 Units Subcutaneous Q4H  . olopatadine  1 drop Both Eyes BID  . scopolamine  1 patch Transdermal Q72H   Continuous Infusions: . dextrose 5 % and 0.45% NaCl 100 mL/hr at 10/08/19 0030  . diltiazem (CARDIZEM) infusion 15 mg/hr (10/08/19 0127)  . heparin 1,000 Units/hr (10/07/19 2319)  . meropenem (MERREM) IV Stopped (10/08/19 0715)  . vancomycin       LOS: 1 day    Time spent: 35 minutes.     Elmarie Shiley, MD [facility]   If 7PM-7AM, please contact night-coverage [email or web address]  10/08/2019, 7:30 AM

## 2019-10-08 NOTE — Op Note (Addendum)
Operative Note  Preoperative diagnosis:  1.  Right ureteral calculus with sepsis secondary to UTI  Post operative diagnosis: 1.  Right ureteral calculus with sepsis secondary to UTI  Procedure(s): 1.  Cystoscopy with right retrograde pyelogram and right ureteral stent placement  Surgeon: Link Snuffer, MD  Assistants: None  Anesthesia: General  Complications: None immediate  EBL: Minimal  Specimens: 1.  None  Drains/Catheters: 1.  6 X 24 double-J ureteral stent 2.  Foley catheter  Intraoperative findings: 1.  Normal urethra and bladder 2.  Right retrograde pyelogram revealed a filling defect at the level of the stone with upstream hydroureteronephrosis  Indication: 69 year old male with multiple medical comorbidities including chronic trach dependence presented with altered mental status, leukocytosis.  CT scan showed a chronically atrophic right kidney with a right ureteral calculus.  Decision was made to proceed urgently for ureteral stent placement.  Description of procedure:  The patient was identified and consent was obtained.  The patient was taken to the operating room and placed in the supine position.  The patient was placed under general anesthesia.  Perioperative antibiotics were administered.  The patient was placed in dorsal lithotomy.  Patient was prepped and draped in a standard sterile fashion and a timeout was performed.  A 21 French rigid cystoscope was advanced into the urethra and into the bladder.  The right distal most portion of the ureter was cannulated with an open-ended ureteral catheter.  Retrograde pyelogram was performed with the findings noted above.  A sensor wire was then advanced up to the kidney under fluoroscopic guidance.  A 6 X 24 double-J ureteral stent was advanced up to the kidney under fluoroscopic guidance.  The wire was withdrawn and fluoroscopy confirmed good proximal placement and direct visualization confirmed a good coil within the  bladder.  The scope was withdrawn and Foley catheter was placed.  This concluded the operation.  Patient tolerated procedure well and was stable postoperatively.  Plan: Continue antibiotics.  Continue to follow cultures.  He will need definitive management of his stone in a few weeks with ureteroscopy.  '

## 2019-10-08 NOTE — OR Nursing (Signed)
Dr. Tyrell Antonio paged.

## 2019-10-08 NOTE — Transfer of Care (Signed)
Immediate Anesthesia Transfer of Care Note  Patient: Luis Orr  Procedure(s) Performed: CYSTOSCOPY WITH  STENT PLACEMENT (Right )  Patient Location: PACU  Anesthesia Type:General  Level of Consciousness: alert   Airway & Oxygen Therapy: Patient placed on Ventilator (see vital sign flow sheet for setting) and patient has trach  Post-op Assessment: Report given to RN and Post -op Vital signs reviewed and stable  Post vital signs: Reviewed and stable  Last Vitals:  Vitals Value Taken Time  BP 142/95 10/08/19 1728  Temp    Pulse    Resp 21 10/08/19 1734  SpO2    Vitals shown include unvalidated device data.  Last Pain:  Vitals:   10/08/19 1200  TempSrc: Oral  PainSc:          Complications: No complications documented.

## 2019-10-08 NOTE — ED Notes (Signed)
Pt currently being prepared to transport to OR.  Respiratory at bedside.

## 2019-10-08 NOTE — Progress Notes (Signed)
Pt transported from PACU to 4K35 without complications

## 2019-10-08 NOTE — Anesthesia Preprocedure Evaluation (Addendum)
Anesthesia Evaluation  Patient identified by MRN, date of birth, ID band Patient awake    Reviewed: Allergy & Precautions, NPO status , Patient's Chart, lab work & pertinent test results  Airway Mallampati: Trach       Dental   Pulmonary    Pulmonary exam normal        Cardiovascular hypertension, Pt. on medications Normal cardiovascular exam+ dysrhythmias Atrial Fibrillation      Neuro/Psych Quadriplegia    GI/Hepatic   Endo/Other  diabetes, Type 2  Renal/GU      Musculoskeletal   Abdominal   Peds  Hematology   Anesthesia Other Findings   Reproductive/Obstetrics                            Anesthesia Physical Anesthesia Plan  ASA: III and emergent  Anesthesia Plan: General   Post-op Pain Management:    Induction: Intravenous  PONV Risk Score and Plan: 2 and Ondansetron and Treatment may vary due to age or medical condition  Airway Management Planned: Tracheostomy  Additional Equipment:   Intra-op Plan:   Post-operative Plan: Possible Post-op intubation/ventilation  Informed Consent: I have reviewed the patients History and Physical, chart, labs and discussed the procedure including the risks, benefits and alternatives for the proposed anesthesia with the patient or authorized representative who has indicated his/her understanding and acceptance.       Plan Discussed with: CRNA and Surgeon  Anesthesia Plan Comments:         Anesthesia Quick Evaluation

## 2019-10-08 NOTE — Progress Notes (Signed)
[facility] for Heparin Indication: atrial fibrillation  Allergies  Allergen Reactions  . Adhesive [Tape] Rash    Patient Measurements: Height: 5\' 6"  (167.6 cm) Weight: 66.8 kg (147 lb 4.3 oz) IBW/kg (Calculated) : 63.8 Heparin Dosing Weight: 66.8 kg  Vital Signs: BP: 118/65 (09/25 0230) Pulse Rate: 54 (09/25 0230)  Labs: Recent Labs    10/07/19 1425 10/08/19 0257  HGB 12.1* 12.6*  HCT 36.0* 37.4*  PLT 234 294  LABPROT  --  16.6*  INR  --  1.4*  HEPARINUNFRC  --  0.64  CREATININE 0.87  --     Estimated Creatinine Clearance: 72.3 mL/min (by C-G formula based on SCr of 0.87 mg/dL).   Medical History: Past Medical History:  Diagnosis Date  . Anemia   . Anxiety   . Chronic respiratory failure ([place])   . DM2 (diabetes mellitus, type 2) ([place])   . Functional quadriplegia (H[facility])   . Hypertension   . Urine retention     Medications:  Scheduled:  . chlorhexidine  5 mL Mouth Rinse Q12H  . insulin aspart  0-9 Units Subcutaneous Q4H  . olopatadine  1 drop Both Eyes BID  . scopolamine  1 patch Transdermal Q72H    Assessment: Patient is a 26 yom that was being admitted for sepsis. Patient went into new onset afib with RVR. Pharmacy has been asked to dose heparin at this time.   9/25 AM update:  Heparin level therapeutic   Goal of Therapy:  Heparin level 0.3-0.7 units/ml Monitor platelets by anticoagulation protocol: Yes   Plan:  Cont heparin at 1000 units/hr 1200 heparin level  Narda Bonds, PharmD, [place] Pharmacist Phone: [phone]

## 2019-10-08 NOTE — Consult Note (Signed)
Consult Note for [facility]  Reason for Consult: Ileus Referring Physician: [facility]  Joseph Pierini HPI: This is a 69 year old male with a PMH of quadraplegia, vent dependence, DM, and HTN admitted for AMS.  It is reported at baseline that he interacts with staff at [facility] and jokes with them, but he was noted to be altered.  While attempting to provide his tube feeds he was noted to have a distended abdomen.  He recently suffered with an ileus as well as a recent UTI with Klebsiella.  There was also an MRSA bacteremia in June.  His abdominal imaging shows that he has a degree of right hydronephrosis for a more proximal obstructing stone.  Urology is currently taking him to the OR to address this issue.  The UA shows that he has large leukocytes and blood.  Abdominal imaging showed an ileus.  Past Medical History:  Diagnosis Date  . Anemia   . Anxiety   . Chronic respiratory failure ([place])   . DM2 (diabetes mellitus, type 2) ([place])   . Functional quadriplegia (H[facility])   . Hypertension   . Urine retention     Past Surgical History:  Procedure Laterality Date  . PEG PLACEMENT    . ROTATOR CUFF REPAIR    . TRACHEOSTOMY      History reviewed. No pertinent family history.  Social History:  reports that he has never smoked. He has never used smokeless tobacco. No history on file for alcohol use and drug use.  Allergies:  Allergies  Allergen Reactions  . Adhesive [Tape] Rash    Medications:  Scheduled: . [MAR Hold] chlorhexidine  5 mL Mouth Rinse Q12H  . [MAR Hold] insulin aspart  0-9 Units Subcutaneous Q4H  . [MAR Hold] olopatadine  1 drop Both Eyes BID  . [MAR Hold] scopolamine  1 patch Transdermal Q72H   Continuous: . dextrose 5 % and 0.45% NaCl 100 mL/hr at 10/08/19 1235  . [MAR Hold] diltiazem (CARDIZEM) infusion 12 mg/hr (10/08/19 1639)  . heparin 1,000 Units/hr (10/08/19 1616)  . [MAR Hold] magnesium sulfate bolus IVPB    . [MAR Hold] meropenem (MERREM) IV  Stopped (10/08/19 0715)  . [MAR Hold] vancomycin      Results for orders placed or performed during the hospital encounter of 10/07/19 (from the past 24 hour(s))  POC CBG, ED     Status: None   Collection Time: 10/07/19  8:26 PM  Result Value Ref Range   Glucose-Capillary 77 70 - 99 mg/dL  CBG monitoring, ED     Status: Abnormal   Collection Time: 10/08/19 12:09 AM  Result Value Ref Range   Glucose-Capillary 69 (L) 70 - 99 mg/dL  Lactic acid, plasma     Status: None   Collection Time: 10/08/19  2:57 AM  Result Value Ref Range   Lactic Acid, Venous 1.1 0.5 - 1.9 mmol/L  Hemoglobin A1c     Status: Abnormal   Collection Time: 10/08/19  2:57 AM  Result Value Ref Range   Hgb A1c MFr Bld 5.8 (H) 4.8 - 5.6 %   Mean Plasma Glucose 119.76 mg/dL  Protime-INR     Status: Abnormal   Collection Time: 10/08/19  2:57 AM  Result Value Ref Range   Prothrombin Time 16.6 (H) 11.4 - 15.2 seconds   INR 1.4 (H) 0.8 - 1.2  Cortisol-am, blood     Status: Abnormal   Collection Time: 10/08/19  2:57 AM  Result Value Ref Range  Cortisol - AM 22.8 (H) 6.7 - 22.6 ug/dL  Procalcitonin     Status: None   Collection Time: 10/08/19  2:57 AM  Result Value Ref Range   Procalcitonin 0.54 ng/mL  CBC     Status: Abnormal   Collection Time: 10/08/19  2:57 AM  Result Value Ref Range   WBC 21.6 (H) 4.0 - 10.5 K/uL   RBC 5.23 4.22 - 5.81 MIL/uL   Hemoglobin 12.6 (L) 13.0 - 17.0 g/dL   HCT 37.4 (L) 39 - 52 %   MCV 71.5 (L) 80.0 - 100.0 fL   MCH 24.1 (L) 26.0 - 34.0 pg   MCHC 33.7 30.0 - 36.0 g/dL   RDW 15.8 (H) 11.5 - 15.5 %   Platelets 294 150 - 400 K/uL   nRBC 0.0 0.0 - 0.2 %  Comprehensive metabolic panel     Status: Abnormal   Collection Time: 10/08/19  2:57 AM  Result Value Ref Range   Sodium 135 135 - 145 mmol/L   Potassium 3.1 (L) 3.5 - 5.1 mmol/L   Chloride 106 98 - 111 mmol/L   CO2 17 (L) 22 - 32 mmol/L   Glucose, Bld 102 (H) 70 - 99 mg/dL   BUN 16 8 - 23 mg/dL   Creatinine, Ser 0.52 (L) 0.61  - 1.24 mg/dL   Calcium 8.7 (L) 8.9 - 10.3 mg/dL   Total Protein 7.2 6.5 - 8.1 g/dL   Albumin 2.9 (L) 3.5 - 5.0 g/dL   AST 13 (L) 15 - 41 U/L   ALT 14 0 - 44 U/L   Alkaline Phosphatase 74 38 - 126 U/L   Total Bilirubin 1.2 0.3 - 1.2 mg/dL   GFR calc non Af Amer >60 >60 mL/min   GFR calc Af Amer >60 >60 mL/min   Anion gap 12 5 - 15  Heparin level (unfractionated)     Status: None   Collection Time: 10/08/19  2:57 AM  Result Value Ref Range   Heparin Unfractionated 0.64 0.30 - 0.70 IU/mL  Magnesium     Status: Abnormal   Collection Time: 10/08/19  2:57 AM  Result Value Ref Range   Magnesium 1.3 (L) 1.7 - 2.4 mg/dL  CBG monitoring, ED     Status: None   Collection Time: 10/08/19  3:22 AM  Result Value Ref Range   Glucose-Capillary 96 70 - 99 mg/dL  CBG monitoring, ED     Status: Abnormal   Collection Time: 10/08/19  9:37 AM  Result Value Ref Range   Glucose-Capillary 140 (H) 70 - 99 mg/dL   Comment 1 Notify RN    Comment 2 Document in Chart   CBG monitoring, ED     Status: Abnormal   Collection Time: 10/08/19 12:53 PM  Result Value Ref Range   Glucose-Capillary 170 (H) 70 - 99 mg/dL  Heparin level (unfractionated)     Status: Abnormal   Collection Time: 10/08/19  1:01 PM  Result Value Ref Range   Heparin Unfractionated 0.29 (L) 0.30 - 0.70 IU/mL     DG Abd 1 View  Result Date: 10/08/2019 CLINICAL DATA:  Ileus EXAM: ABDOMEN - 1 VIEW COMPARISON:  Abdominal CT from yesterday FINDINGS: Diffuse gaseous distension of small and large bowel, reaching the rectum. No concerning mass effect or gas collection. Infiltrate at both lung bases. IMPRESSION: 1. Adynamic ileus pattern without change from CT yesterday. 2. Bilateral lower lobe infiltrate. Electronically Signed   By: Monte Fantasia M.D.   On: 10/08/2019  10:17   CT ABDOMEN PELVIS W CONTRAST  Result Date: 10/07/2019 CLINICAL DATA:  Abdominal distention EXAM: CT ABDOMEN AND PELVIS WITH CONTRAST TECHNIQUE: Multidetector CT imaging of  the abdomen and pelvis was performed using the standard protocol following bolus administration of intravenous contrast. CONTRAST:  179mL OMNIPAQUE IOHEXOL 300 MG/ML  SOLN COMPARISON:  07/06/2019 FINDINGS: Lower chest: Small bilateral pleural effusions with basilar consolidation or atelectasis, greater on the right. Hepatobiliary: No focal liver abnormality is seen. No gallstones, gallbladder wall thickening, or biliary dilatation. Pancreas: Unremarkable. No pancreatic ductal dilatation or surrounding inflammatory changes. Spleen: Normal in size without focal abnormality. Adrenals/Urinary Tract: No adrenal gland nodules. Severe atrophy and scarring of the right kidney. Calcification at the level of the mid right ureter may be in the ureter or a phlebolith in the adjacent gonadal vein. Mild right hilar caliectasis, possibly chronic ureteropelvic junction obstruction. Moderate focal scarring of the left kidney. Large left renal cyst. Bladder is decompressed with a Foley catheter. Stomach/Bowel: The colon is diffusely gas distended with small amount of fluid. Contrast material is present in the rectum. Changes likely represent adynamic ileus. The appendix is normal. Small bowel are mostly decompressed. A percutaneous gastrostomy tube is present in the stomach. Vascular/Lymphatic: Aortic atherosclerosis. No enlarged abdominal or pelvic lymph nodes. Reproductive: Prostate gland is not enlarged. Other: No free air or free fluid in the abdomen. Abdominal wall musculature is atrophic. Musculoskeletal: Degenerative changes in the spine. Benign-appearing sclerosis in the inter trochanteric left femur. IMPRESSION: 1. Diffusely gas distended colon with small amount of fluid. Changes likely represent adynamic ileus. 2. Small bilateral pleural effusions with basilar consolidation or atelectasis, greater on the right. 3. Severe atrophy and scarring of the right kidney. 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 Moderate focal  scarring of the left kidney. Large left renal cyst. 4. Aortic atherosclerosis. 5. Percutaneous gastrostomy tube in the stomach. Aortic Atherosclerosis (ICD10-I70.0). Electronically Signed   By: Lucienne Capers M.D.   On: 10/07/2019 19:30   DG Chest Portable 1 View  Result Date: 10/07/2019 CLINICAL DATA:  Altered mental status. History of respiratory failure. Tracheostomy. EXAM: PORTABLE CHEST 1 VIEW COMPARISON:  07/16/2019. FINDINGS: Tracheostomy tube in stable position. Stable cardiomegaly. Low lung volumes with bibasilar atelectasis and infiltrates. Slight improvement in aeration of the lung basis from prior exam. No prominent pleural effusion. No pneumothorax. IMPRESSION: 1.  Tracheostomy tube in stable position. 2. Low lung volumes with bibasilar atelectasis and infiltrates again noted. Slight improvement in aeration of the lung bases from prior exam. Electronically Signed   By: [place]   On: 10/07/2019 14:11    ROS:  As stated above in the HPI otherwise negative.  Blood pressure 125/87, pulse 74, temperature 98.6 F (37 C), temperature source Oral, resp. rate 13, height 5\' 6"  (1.676 m), weight 66.8 kg, SpO2 99 %.    PE: Gen: Unresponsive Neck: tracheostomy Lungs: CTA Bilaterally CV: RRR without M/G/R ABD: Soft, distended, tympanic, +BS, PEG tube Ext: No C/C/E, atrophied extremities.  Assessment/Plan: 1) Ileus. 2) UTI. 3) Right ureteral stone. 4) Vent dependent via trach.   The ileus is secondary to his UTI/septic state.  With supportive are this issue should resolve with time, but it is a difficult issue treat in general.  Plan: 1) Agree with NPO. 2) If abdominal distension worsens he will need an NG tube and possibly a rectal tube.  Alec Jaros D 10/08/2019, 4:36 PM

## 2019-10-08 NOTE — OR Nursing (Signed)
Dr. Tamala Julian and Richardson Landry Minor with CCM at bedside.

## 2019-10-09 ENCOUNTER — Inpatient Hospital Stay (HOSPITAL_COMMUNITY): Payer: Medicare Other

## 2019-10-09 DIAGNOSIS — Z794 Long term (current) use of insulin: Secondary | ICD-10-CM

## 2019-10-09 DIAGNOSIS — E1100 Type 2 diabetes mellitus with hyperosmolarity without nonketotic hyperglycemic-hyperosmolar coma (NKHHC): Secondary | ICD-10-CM

## 2019-10-09 LAB — BLOOD CULTURE ID PANEL (REFLEXED) - BCID2

## 2019-10-09 LAB — BASIC METABOLIC PANEL
Anion gap: 12 (ref 5–15)
BUN: 11 mg/dL (ref 8–23)
CO2: 16 mmol/L — ABNORMAL LOW (ref 22–32)
Calcium: 8.2 mg/dL — ABNORMAL LOW (ref 8.9–10.3)
Chloride: 103 mmol/L (ref 98–111)
Creatinine, Ser: 0.47 mg/dL — ABNORMAL LOW (ref 0.61–1.24)
GFR calc Af Amer: >60 mL/min (ref >60)
GFR calc non Af Amer: >60 mL/min (ref >60)
Glucose, Bld: 254 mg/dL — ABNORMAL HIGH (ref 70–99)
Potassium: 3.3 mmol/L — ABNORMAL LOW (ref 3.5–5.1)
Sodium: 131 mmol/L — ABNORMAL LOW (ref 135–145)

## 2019-10-09 LAB — HEPARIN LEVEL (UNFRACTIONATED)
Heparin Unfractionated: 0.15 IU/mL — ABNORMAL LOW (ref 0.30–0.70)
Heparin Unfractionated: 0.25 IU/mL — ABNORMAL LOW (ref 0.30–0.70)

## 2019-10-09 LAB — CBC
HCT: 37.8 % — ABNORMAL LOW (ref 39.0–52.0)
Hemoglobin: 13 g/dL (ref 13.0–17.0)
MCH: 24.3 pg — ABNORMAL LOW (ref 26.0–34.0)
MCHC: 34.4 g/dL (ref 30.0–36.0)
MCV: 70.5 fL — ABNORMAL LOW (ref 80.0–100.0)
Platelets: 353 10*3/uL (ref 150–400)
RBC: 5.36 MIL/uL (ref 4.22–5.81)
RDW: 15.7 % — ABNORMAL HIGH (ref 11.5–15.5)
WBC: 17.4 10*3/uL — ABNORMAL HIGH (ref 4.0–10.5)
nRBC: 0 % (ref 0.0–0.2)

## 2019-10-09 LAB — GLUCOSE, CAPILLARY
Glucose-Capillary: 120 mg/dL — ABNORMAL HIGH (ref 70–99)
Glucose-Capillary: 134 mg/dL — ABNORMAL HIGH (ref 70–99)
Glucose-Capillary: 159 mg/dL — ABNORMAL HIGH (ref 70–99)
Glucose-Capillary: 162 mg/dL — ABNORMAL HIGH (ref 70–99)
Glucose-Capillary: 175 mg/dL — ABNORMAL HIGH (ref 70–99)
Glucose-Capillary: 260 mg/dL — ABNORMAL HIGH (ref 70–99)
Glucose-Capillary: 276 mg/dL — ABNORMAL HIGH (ref 70–99)

## 2019-10-09 LAB — URINE CULTURE: Culture: >=100000 — AB

## 2019-10-09 LAB — MAGNESIUM: Magnesium: 1.7 mg/dL (ref 1.7–2.4)

## 2019-10-09 LAB — MRSA PCR SCREENING: MRSA by PCR: NEGATIVE

## 2019-10-09 MED ORDER — PANTOPRAZOLE SODIUM 40 MG IV SOLR
40.0000 mg | Freq: Every day | INTRAVENOUS | Status: DC
  Administered 2019-10-09 – 2019-10-10 (×2): 40 mg via INTRAVENOUS
  Filled 2019-10-09 (×2): qty 40

## 2019-10-09 MED ORDER — AMIODARONE LOAD VIA INFUSION
150.0000 mg | Freq: Once | INTRAVENOUS | Status: AC
  Administered 2019-10-09: 150 mg via INTRAVENOUS
  Filled 2019-10-09: qty 83.34

## 2019-10-09 MED ORDER — CHLORHEXIDINE GLUCONATE CLOTH 2 % EX PADS
6.0000 | MEDICATED_PAD | Freq: Every day | CUTANEOUS | Status: DC
  Administered 2019-10-10 – 2019-10-11 (×2): 6 via TOPICAL

## 2019-10-09 MED ORDER — CHLORHEXIDINE GLUCONATE 0.12% ORAL RINSE (MEDLINE KIT)
15.0000 mL | Freq: Two times a day (BID) | OROMUCOSAL | Status: DC
  Administered 2019-10-09 – 2019-10-11 (×5): 15 mL via OROMUCOSAL

## 2019-10-09 MED ORDER — ORAL CARE MOUTH RINSE
15.0000 mL | OROMUCOSAL | Status: DC
  Administered 2019-10-09 – 2019-10-11 (×26): 15 mL via OROMUCOSAL

## 2019-10-09 MED ORDER — AMIODARONE HCL IN DEXTROSE 360-4.14 MG/200ML-% IV SOLN
30.0000 mg/h | INTRAVENOUS | Status: DC
  Administered 2019-10-10 – 2019-10-11 (×4): 30 mg/h via INTRAVENOUS
  Filled 2019-10-09 (×3): qty 200

## 2019-10-09 MED ORDER — LACTATED RINGERS IV SOLN: INTRAVENOUS | Status: DC

## 2019-10-09 MED ORDER — INSULIN DETEMIR 100 UNIT/ML ~~LOC~~ SOLN
10.0000 [IU] | Freq: Two times a day (BID) | SUBCUTANEOUS | Status: DC
  Administered 2019-10-09 – 2019-10-10 (×3): 10 [IU] via SUBCUTANEOUS
  Filled 2019-10-09 (×5): qty 0.1

## 2019-10-09 MED ORDER — POTASSIUM CHLORIDE 10 MEQ/100ML IV SOLN
10.0000 meq | INTRAVENOUS | Status: AC
  Administered 2019-10-09 (×4): 10 meq via INTRAVENOUS
  Filled 2019-10-09 (×3): qty 100

## 2019-10-09 MED ORDER — AMIODARONE HCL IN DEXTROSE 360-4.14 MG/200ML-% IV SOLN
60.0000 mg/h | INTRAVENOUS | Status: AC
  Administered 2019-10-09 (×2): 60 mg/h via INTRAVENOUS
  Filled 2019-10-09 (×2): qty 200

## 2019-10-09 NOTE — Plan of Care (Signed)
  Problem: Education: Goal: Knowledge of General Education information will improve Description Including pain rating scale, medication(s)/side effects and non-pharmacologic comfort measures Outcome: Progressing   Problem: Clinical Measurements: Goal: Ability to maintain clinical measurements within normal limits will improve Outcome: Progressing Goal: Will remain free from infection Outcome: Progressing Goal: Diagnostic test results will improve Outcome: Progressing Goal: Respiratory complications will improve Outcome: Progressing Goal: Cardiovascular complication will be avoided Outcome: Progressing   Problem: Elimination: Goal: Will not experience complications related to bowel motility Outcome: Progressing Goal: Will not experience complications related to urinary retention Outcome: Progressing   Problem: Pain Managment: Goal: General experience of comfort will improve Outcome: Progressing   Problem: Safety: Goal: Ability to remain free from injury will improve Outcome: Progressing   Problem: Skin Integrity: Goal: Risk for impaired skin integrity will decrease Outcome: Progressing   

## 2019-10-09 NOTE — Consult Note (Addendum)
This is a progress note.    NAME:  Luis Orr, MRN:  [id number], DOB:  [birth year], LOS: 2 ADMISSION DATE:  10/07/2019, CONSULTATION DATE: 10/07/2019 REFERRING MD: Dr. 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  Patient is a resident of [facility].  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  Patient is a resident of [facility].  Patient presented to the emergency department with distended abdomen.  Also has a history of recurrent ESBL Klebsiella pneumoniae UTIs in June and July.  Also history of MRSA bacteremia in June.  Upon presentation to the emergency department patient found to be febrile with a temperature of 100.7, white blood cell count 19,000 initial blood pressure systolic in the 24Q.  Patient was given 2.5 L of lactated Ringer's with improvement in his blood pressure into the 140s.  Urinalysis revealed blood, greater than 50 white blood cells and many bacteria.  CT scan of the abdomen revealed distended colon chronic right ureteral junction obstruction, atrophy of the right kidney.  Which is chronic.  Pulmonary was consulted for recommendations and ventilator management as he is chronically on the vent.  Past Medical History   Past Medical History:  Diagnosis Date  . Anemia   . Anxiety   . Chronic respiratory failure ([place])   . DM2 (diabetes mellitus, type 2) ([place])   . Functional quadriplegia ([facility])   . Hypertension   . Urine retention      Significant Hospital Events     Consults:  Pulmonary critical care Surgery  Procedures:    Significant Diagnostic Tests:    Micro Data:    COVID-19 negative Prior history of ESBL Klebsiella, history of MRSA  Antimicrobials:  Meropenem plus vancomycin  Interim history/subjective:  No events, Remains in Afib/rvr  Objective   Blood pressure 131/85, pulse 75, temperature 98.1 F (36.7 C), temperature source Axillary, resp. rate 13, height 5\' 6"  (1.676 m), weight 66.8 kg, SpO2 98 %.    Vent Mode: PRVC FiO2 (%):  [40 %-60 %] 50 % Set Rate:  [12 bmp] 12 bmp Vt Set:  [500 mL] 500 mL PEEP:  [5 cmH20] 5 cmH20 Plateau Pressure:  [17 cmH20-24 cmH20] 17 cmH20   Intake/Output Summary (Last 24 hours) at 10/09/2019 0646 Last data filed at 10/09/2019 0600 Gross per 24 hour  Intake 3290.76 ml  Output 3951 ml  Net -660.24 ml   Filed Weights   10/07/19 1400  Weight: 66.8 kg    Examination: Constitutional: chronically debilitated man in no acute distress Eyes: eyes are anicteric, reactive to light Ears, nose, mouth, and throat: mucous membranes dry, tracheostomy tube in place, minimal secretions Cardiovascular: heart sounds are regular, ext are warm to touch. 1+ edema Respiratory: shallow inspiratory efforts, on PS on vent Gastrointestinal: abdomen is soft with + BS Skin: No rashes, normal turgor Neurologic: quadriplegic Psychiatric: RASS 0  K low: being repleted Hyperglycemia WBC still elevated  Resolved Hospital Problem list     Assessment & Plan:   Chronic hypoxemic respiratory failure requiring tracheostomy tube and chronic mechanical ventilatory support. High cervical, C3 C5 compression, functional quadriplegic - Continue PS vs. AC depending on how patient does - VAP prevention bundle  Severe sepsis secondary to klebsiella UTI  possibly related to UPJ stone - Continue meropenem, f/u culture susceptibilities - 1/2 bottles staph in blood, suspect contaminant, repeat blood cx, low threshold to D/C vanc  Ileus- PEG to LIS, continue trial of reglan, starting to pass gas and have liquid stool, gentle IVF, hold  scopalamine patch  Type 2 DM with hyperglycemia - Start levemir - Continue SSI  Afib/RVR- on dilt gtt, need PO access before we can wean - Continue heparin gtt  Best practice:  Diet: NPO Pain/Anxiety/Delirium protocol (if indicated): Fentanyl PRN VAP protocol (if indicated): in place DVT prophylaxis: heparin gtt GI prophylaxis: PPI Glucose control: see above Mobility: quadriplegic Code Status: full Family Communication: updated patient Disposition: ICU while needing vent    The patient is critically ill with multiple organ systems failure and requires high complexity decision making for assessment and support, frequent evaluation and titration of therapies, application of advanced monitoring technologies and extensive interpretation of multiple databases. Critical Care Time devoted to patient care services described in this note independent of APP/resident time (if applicable)  is 34 minutes.   Erskine Emery MD [facility] 10/09/2019 7:00 AM Personal pager: [phone] If unanswered, please page CCM On-call: [phone]

## 2019-10-09 NOTE — Anesthesia Postprocedure Evaluation (Signed)
Anesthesia Post Note  Patient: Luis Orr  Procedure(s) Performed: CYSTOSCOPY WITH  STENT PLACEMENT (Right )     Patient location during evaluation: PACU Anesthesia Type: General Level of consciousness: awake and alert Pain management: pain level controlled Vital Signs Assessment: post-procedure vital signs reviewed and stable Respiratory status: spontaneous breathing, nonlabored ventilation, respiratory function stable and patient connected to nasal cannula oxygen Cardiovascular status: blood pressure returned to baseline and stable Postop Assessment: no apparent nausea or vomiting Anesthetic complications: no   No complications documented.  Last Vitals:  Vitals:   10/09/19 0800 10/09/19 0803  BP: 125/89   Pulse: (!) 115   Resp: 17   Temp:  (!) 36.4 C  SpO2: 99%     Last Pain:  Vitals:   10/09/19 0800  TempSrc:   PainSc: Asleep                 Corydon Schweiss DAVID

## 2019-10-09 NOTE — Progress Notes (Signed)
[facility] Progress Note Patient Name: Luis Orr DOB: 11-27-1950 MRN: [id number]   Date of Service  10/09/2019  HPI/Events of Note  K 3.3, Na 131, glucose 254  eICU Interventions  Shift D5 0.45 to LR Ordered K total 40 meqs IV ordered     Intervention Category Major Interventions: Electrolyte abnormality - evaluation and management  Judd Lien 10/09/2019, 4:30 AM

## 2019-10-09 NOTE — Progress Notes (Signed)
  Amiodarone Drug - Drug Interaction Consult Note  Recommendations: No adjustments - monitor vitals.   Amiodarone is metabolized by the cytochrome P450 system and therefore has the potential to cause many drug interactions. Amiodarone has an average plasma half-life of 50 days (range 20 to 100 days).   There is potential for drug interactions to occur several weeks or months after stopping treatment and the onset of drug interactions may be slow after initiating amiodarone.   []  Statins: Increased risk of myopathy. Simvastatin- restrict dose to 20mg  daily. Other statins: counsel patients to report any muscle pain or weakness immediately.  []  Anticoagulants: Amiodarone can increase anticoagulant effect. Consider warfarin dose reduction. Patients should be monitored closely and the dose of anticoagulant altered accordingly, remembering that amiodarone levels take several weeks to stabilize.  []  Antiepileptics: Amiodarone can increase plasma concentration of phenytoin, the dose should be reduced. Note that small changes in phenytoin dose can result in large changes in levels. Monitor patient and counsel on signs of toxicity.  []  Beta blockers: increased risk of bradycardia, AV block and myocardial depression. Sotalol - avoid concomitant use.  [x]   Calcium channel blockers (diltiazem and verapamil): increased risk of bradycardia, AV block and myocardial depression.  []   Cyclosporine: Amiodarone increases levels of cyclosporine. Reduced dose of cyclosporine is recommended.  []  Digoxin dose should be halved when amiodarone is started.  []  Diuretics: increased risk of cardiotoxicity if hypokalemia occurs.  []  Oral hypoglycemic agents (glyburide, glipizide, glimepiride): increased risk of hypoglycemia. Patient's glucose levels should be monitored closely when initiating amiodarone therapy.   []  Drugs that prolong the QT interval:  Torsades de pointes risk may be increased with concurrent use -  avoid if possible.  Monitor QTc, also keep magnesium/potassium WNL if concurrent therapy can't be avoided. Marland Kitchen Antibiotics: e.g. fluoroquinolones, erythromycin. . Antiarrhythmics: e.g. quinidine, procainamide, disopyramide, sotalol. . Antipsychotics: e.g. phenothiazines, haloperidol.  . Lithium, tricyclic antidepressants, and methadone.  Thank You, Antonietta Jewel, PharmD, [facility] Clinical Pharmacist  Phone: [phone] 10/09/2019 6:03 PM  Please check AMION for all [facility] phone numbers After 10:00 PM, call [facility] [place] [phone]

## 2019-10-09 NOTE — Progress Notes (Addendum)
Progress Note for  GI  Subjective: No acute events.  Objective: Vital signs in last 24 hours: Temp:  [97.1 F (36.2 C)-99.6 F (37.6 C)] 98.1 F (36.7 C) (09/26 0335) Pulse Rate:  [37-167] 75 (09/26 0600) Resp:  [13-26] 13 (09/26 0600) BP: (110-157)/(69-127) 131/85 (09/26 0600) SpO2:  [94 %-100 %] 98 % (09/26 0600) FiO2 (%):  [40 %-60 %] 50 % (09/26 0301) Last BM Date: 10/08/19  Intake/Output from previous day: 09/25 0701 - 09/26 0700 In: 3290.8 [I.V.:2781.3; IV Piggyback:509.5] Out: 6578 [Urine:3950; Blood:1] Intake/Output this shift: No intake/output data recorded.  General appearance: eyes opened, responds to voice, still very weak GI: distended, tympanic, scant bowel sounds, but softer compared to yesterday  Lab Results: Recent Labs    10/07/19 1425 10/08/19 0257 10/08/19 1923  WBC 19.2* 21.6* 21.7*  HGB 12.1* 12.6* 13.1  HCT 36.0* 37.4* 38.3*  PLT 234 294 314   BMET Recent Labs    10/07/19 1425 10/08/19 0257 10/09/19 0244  NA 133* 135 131*  K 3.1* 3.1* 3.3*  CL 107 106 103  CO2 15* 17* 16*  GLUCOSE 155* 102* 254*  BUN 25* 16 11  CREATININE 0.87 0.52* 0.47*  CALCIUM 8.4* 8.7* 8.2*   LFT Recent Labs    10/08/19 0257  PROT 7.2  ALBUMIN 2.9*  AST 13*  ALT 14  ALKPHOS 74  BILITOT 1.2   PT/INR Recent Labs    10/08/19 0257  LABPROT 16.6*  INR 1.4*   Hepatitis Panel No results for input(s): HEPBSAG, HCVAB, HEPAIGM, HEPBIGM in the last 72 hours. C-Diff No results for input(s): CDIFFTOX in the last 72 hours. Fecal Lactopherrin No results for input(s): FECLLACTOFRN in the last 72 hours.  Studies/Results: DG Abd 1 View  Result Date: 10/08/2019 CLINICAL DATA:  Ileus EXAM: ABDOMEN - 1 VIEW COMPARISON:  Abdominal CT from yesterday FINDINGS: Diffuse gaseous distension of small and large bowel, reaching the rectum. No concerning mass effect or gas collection. Infiltrate at both lung bases. IMPRESSION: 1. Adynamic ileus pattern without  change from CT yesterday. 2. Bilateral lower lobe infiltrate. Electronically Signed   By: Monte Fantasia M.D.   On: 10/08/2019 10:17   CT ABDOMEN PELVIS W CONTRAST  Result Date: 10/07/2019 CLINICAL DATA:  Abdominal distention EXAM: CT ABDOMEN AND PELVIS WITH CONTRAST TECHNIQUE: Multidetector CT imaging of the abdomen and pelvis was performed using the standard protocol following bolus administration of intravenous contrast. CONTRAST:  164m OMNIPAQUE IOHEXOL 300 MG/ML  SOLN COMPARISON:  07/06/2019 FINDINGS: Lower chest: Small bilateral pleural effusions with basilar consolidation or atelectasis, greater on the right. Hepatobiliary: No focal liver abnormality is seen. No gallstones, gallbladder wall thickening, or biliary dilatation. Pancreas: Unremarkable. No pancreatic ductal dilatation or surrounding inflammatory changes. Spleen: Normal in size without focal abnormality. Adrenals/Urinary Tract: No adrenal gland nodules. Severe atrophy and scarring of the right kidney. Calcification at the level of the mid right ureter may be in the ureter or a phlebolith in the adjacent gonadal vein. Mild right hilar caliectasis, possibly chronic ureteropelvic junction obstruction. Moderate focal scarring of the left kidney. Large left renal cyst. Bladder is decompressed with a Foley catheter. Stomach/Bowel: The colon is diffusely gas distended with small amount of fluid. Contrast material is present in the rectum. Changes likely represent adynamic ileus. The appendix is normal. Small bowel are mostly decompressed. A percutaneous gastrostomy tube is present in the stomach. Vascular/Lymphatic: Aortic atherosclerosis. No enlarged abdominal or pelvic lymph nodes. Reproductive: Prostate gland is not enlarged. Other: No  free air or free fluid in the abdomen. Abdominal wall musculature is atrophic. Musculoskeletal: Degenerative changes in the spine. Benign-appearing sclerosis in the inter trochanteric left femur. IMPRESSION: 1.  Diffusely gas distended colon with small amount of fluid. Changes likely represent adynamic ileus. 2. Small bilateral pleural effusions with basilar consolidation or atelectasis, greater on the right. 3. Severe atrophy and scarring of the right kidney. 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 Moderate focal scarring of the left kidney. Large left renal cyst. 4. Aortic atherosclerosis. 5. Percutaneous gastrostomy tube in the stomach. Aortic Atherosclerosis (ICD10-I70.0). Electronically Signed   By: Lucienne Capers M.D.   On: 10/07/2019 19:30   DG Retrograde Pyelogram  Result Date: 10/08/2019 CLINICAL DATA:  Right retrograde pyelogram and stent placement. EXAM: RETROGRADE PYELOGRAM COMPARISON:  10/08/2019.  CT dated 10/07/2019 FINDINGS: The patient appears to have undergone right sided ureteral stent placement. Injection of contrast opacifies the collecting system. There is some contrast inferior to the expected region of the kidney which may be extraluminal. The total fluoroscopy time was 29 seconds. A single image was submitted for review. IMPRESSION: Right stent placement as detailed above. Electronically Signed   By: Constance Holster M.D.   On: 10/08/2019 17:21   DG Chest Portable 1 View  Result Date: 10/07/2019 CLINICAL DATA:  Altered mental status. History of respiratory failure. Tracheostomy. EXAM: PORTABLE CHEST 1 VIEW COMPARISON:  07/16/2019. FINDINGS: Tracheostomy tube in stable position. Stable cardiomegaly. Low lung volumes with bibasilar atelectasis and infiltrates. Slight improvement in aeration of the lung basis from prior exam. No prominent pleural effusion. No pneumothorax. IMPRESSION: 1.  Tracheostomy tube in stable position. 2. Low lung volumes with bibasilar atelectasis and infiltrates again noted. Slight improvement in aeration of the lung bases from prior exam. Electronically Signed   By: Marcello Moores  Register   On: 10/07/2019 14:11   DG C-Arm 1-60 Min  Result Date: 10/08/2019 CLINICAL  DATA:  Ureteral stent placement EXAM: DG C-ARM 1-60 MIN FLUOROSCOPY TIME:  Fluoroscopy Time:  29 seconds Number of Acquired Spot Images: 1 COMPARISON:  CT dated 10/07/2019 FINDINGS: The patient appears to have undergone right sided ureteral stent placement. Injection of contrast opacifies the collecting system. There is some contrast inferior to the expected region of the kidney which may be extraluminal. The total fluoroscopy time was 29 seconds. A single image was submitted for review. IMPRESSION: Stent placement as above. Please see separate operative report for complete intraoperative details. Electronically Signed   By: Constance Holster M.D.   On: 10/08/2019 17:22    Medications:  Scheduled: . chlorhexidine  5 mL Mouth Rinse Q12H  . chlorhexidine gluconate (MEDLINE KIT)  15 mL Mouth Rinse BID  . Chlorhexidine Gluconate Cloth  6 each Topical Daily  . Chlorhexidine Gluconate Cloth  6 each Topical Daily  . insulin aspart  0-9 Units Subcutaneous Q4H  . insulin detemir  10 Units Subcutaneous BID  . mouth rinse  15 mL Mouth Rinse 10 times per day  . metoCLOPramide (REGLAN) injection  10 mg Intravenous Q6H  . olopatadine  1 drop Both Eyes BID  . pantoprazole (PROTONIX) IV  40 mg Intravenous QHS   Continuous: . diltiazem (CARDIZEM) infusion 15 mg/hr (10/09/19 0600)  . heparin 1,050 Units/hr (10/09/19 0600)  . lactated ringers 75 mL/hr at 10/09/19 0600  . meropenem (MERREM) IV Stopped (10/09/19 0455)  . potassium chloride 10 mEq (10/09/19 0640)  . vancomycin 1,000 mg (10/08/19 1759)    Assessment/Plan: 1) Ileus. 2) Sepsis from UTI. 3) Vent  dependent.   The patient is clinically improved.  He is able to respond to voice and engage in some eye contact.  His abdomen is softer compared to yesterday.  Nursing reports that he passed some greenish mixed with mucus stool.  Potassium is mildly low and his blood glucose is elevated.  Correction of these metabolic issues as well as treating his UTI  will improve and ultimately resolve his ileus.  The patient is on metoclopramide, which may help.  The main concern is that it can be a source of AMS.  Plan: 1) Continue with antibiotics. 2) Correct K and blood glucose. 3) Okay with metoclopramide for now. 4) [facility] will assume care in the AM.  LOS: 2 days   Luis Orr D 10/09/2019, 7:33 AM

## 2019-10-09 NOTE — Progress Notes (Signed)
[place] for Heparin Indication: atrial fibrillation  Allergies  Allergen Reactions  . Adhesive [Tape] Rash    Patient Measurements: Height: 5' 6"  (167.6 cm) Weight: 66.8 kg (147 lb 4.3 oz) IBW/kg (Calculated) : 63.8 Heparin Dosing Weight: 66.8 kg  Vital Signs: Temp: 97.5 F (36.4 C) (09/26 0803) Temp Source: Axillary (09/26 0335) BP: 125/89 (09/26 0800) Pulse Rate: 60 (09/26 0900)  Labs: Recent Labs    10/07/19 1425 10/07/19 1425 10/08/19 0257 10/08/19 1301 10/08/19 1923 10/09/19 0244 10/09/19 0838  HGB 12.1*   < > 12.6*  --  13.1  --   --   HCT 36.0*  --  37.4*  --  38.3*  --   --   PLT 234  --  294  --  314  --   --   LABPROT  --   --  16.6*  --   --   --   --   INR  --   --  1.4*  --   --   --   --   HEPARINUNFRC  --   --  0.64 0.29*  --   --  0.15*  CREATININE 0.87  --  0.52*  --   --  0.47*  --    < > = values in this interval not displayed.    Estimated Creatinine Clearance: 78.6 mL/min (A) (by C-G formula based on SCr of 0.47 mg/dL (L)).   Medical History: Past Medical History:  Diagnosis Date  . Anemia   . Anxiety   . Chronic respiratory failure ([place])   . DM2 (diabetes mellitus, type 2) ([place])   . Functional quadriplegia ([facility])   . Hypertension   . Urine retention     Medications:  Scheduled:  . chlorhexidine  5 mL Mouth Rinse Q12H  . chlorhexidine gluconate (MEDLINE KIT)  15 mL Mouth Rinse BID  . Chlorhexidine Gluconate Cloth  6 each Topical Daily  . Chlorhexidine Gluconate Cloth  6 each Topical Daily  . insulin aspart  0-9 Units Subcutaneous Q4H  . insulin detemir  10 Units Subcutaneous BID  . mouth rinse  15 mL Mouth Rinse 10 times per day  . metoCLOPramide (REGLAN) injection  10 mg Intravenous Q6H  . olopatadine  1 drop Both Eyes BID  . pantoprazole (PROTONIX) IV  40 mg Intravenous QHS    Assessment: Patient is a 68 yom that was being admitted for sepsis. Patient went into new onset afib with  RVR. Pharmacy has been asked to dose heparin at this time.   Heparin level now subtherapeutic after increasing drip rate slightly to 1050 units/hr. Last CBC yesterday evening wnl. Today's lab pending. No overt bleeding or infusion issues per discussion with nursing.   Goal of Therapy:  Heparin level 0.3-0.7 units/ml Monitor platelets by anticoagulation protocol: Yes   Plan:  Increase heparin infusion to 1200 units/hr Check 6-hr HL Monitor daily HL, CBC, s/sx bleeding  Richardine Service, PharmD PGY2 Cardiology Pharmacy Resident Phone: [phone] 10/09/2019  11:03 AM  Please check [email or web address] for unit-specific pharmacy phone numbers.

## 2019-10-09 NOTE — Progress Notes (Signed)
1 Day Post-Op   Subjective/Chief Complaint: Patient had a small bowel movement overnight Abdomen seems softer  Objective: Vital signs in last 24 hours: Temp:  [97.1 F (36.2 C)-99.6 F (37.6 C)] 98.1 F (36.7 C) (09/26 0335) Pulse Rate:  [37-167] 62 (09/26 0730) Resp:  [13-26] 15 (09/26 0730) BP: (110-157)/(69-127) 135/89 (09/26 0730) SpO2:  [93 %-100 %] 100 % (09/26 0730) FiO2 (%):  [40 %-60 %] 50 % (09/26 0301) Last BM Date: 10/08/19  Intake/Output from previous day: 09/25 0701 - 09/26 0700 In: 3463.9 [I.V.:2880.6; IV Piggyback:583.3] Out: 3951 [Urine:3950; Blood:1] Intake/Output this shift: Total I/O In: 6.9 [I.V.:6.9] Out: -   Abd - distended, softer; no peritonitis  Lab Results:  Recent Labs    10/08/19 0257 10/08/19 1923  WBC 21.6* 21.7*  HGB 12.6* 13.1  HCT 37.4* 38.3*  PLT 294 314   BMET Recent Labs    10/08/19 0257 10/09/19 0244  NA 135 131*  K 3.1* 3.3*  CL 106 103  CO2 17* 16*  GLUCOSE 102* 254*  BUN 16 11  CREATININE 0.52* 0.47*  CALCIUM 8.7* 8.2*   PT/INR Recent Labs    10/08/19 0257  LABPROT 16.6*  INR 1.4*   ABG No results for input(s): PHART, HCO3 in the last 72 hours.  Invalid input(s): PCO2, PO2  Studies/Results: DG Abd 1 View  Result Date: 10/08/2019 CLINICAL DATA:  Ileus EXAM: ABDOMEN - 1 VIEW COMPARISON:  Abdominal CT from yesterday FINDINGS: Diffuse gaseous distension of small and large bowel, reaching the rectum. No concerning mass effect or gas collection. Infiltrate at both lung bases. IMPRESSION: 1. Adynamic ileus pattern without change from CT yesterday. 2. Bilateral lower lobe infiltrate. Electronically Signed   By: Monte Fantasia M.D.   On: 10/08/2019 10:17   CT ABDOMEN PELVIS W CONTRAST  Result Date: 10/07/2019 CLINICAL DATA:  Abdominal distention EXAM: CT ABDOMEN AND PELVIS WITH CONTRAST TECHNIQUE: Multidetector CT imaging of the abdomen and pelvis was performed using the standard protocol following bolus  administration of intravenous contrast. CONTRAST:  138mL OMNIPAQUE IOHEXOL 300 MG/ML  SOLN COMPARISON:  07/06/2019 FINDINGS: Lower chest: Small bilateral pleural effusions with basilar consolidation or atelectasis, greater on the right. Hepatobiliary: No focal liver abnormality is seen. No gallstones, gallbladder wall thickening, or biliary dilatation. Pancreas: Unremarkable. No pancreatic ductal dilatation or surrounding inflammatory changes. Spleen: Normal in size without focal abnormality. Adrenals/Urinary Tract: No adrenal gland nodules. Severe atrophy and scarring of the right kidney. Calcification at the level of the mid right ureter may be in the ureter or a phlebolith in the adjacent gonadal vein. Mild right hilar caliectasis, possibly chronic ureteropelvic junction obstruction. Moderate focal scarring of the left kidney. Large left renal cyst. Bladder is decompressed with a Foley catheter. Stomach/Bowel: The colon is diffusely gas distended with small amount of fluid. Contrast material is present in the rectum. Changes likely represent adynamic ileus. The appendix is normal. Small bowel are mostly decompressed. A percutaneous gastrostomy tube is present in the stomach. Vascular/Lymphatic: Aortic atherosclerosis. No enlarged abdominal or pelvic lymph nodes. Reproductive: Prostate gland is not enlarged. Other: No free air or free fluid in the abdomen. Abdominal wall musculature is atrophic. Musculoskeletal: Degenerative changes in the spine. Benign-appearing sclerosis in the inter trochanteric left femur. IMPRESSION: 1. Diffusely gas distended colon with small amount of fluid. Changes likely represent adynamic ileus. 2. Small bilateral pleural effusions with basilar consolidation or atelectasis, greater on the right. 3. Severe atrophy and scarring of the right kidney. 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 Moderate focal scarring of the left kidney. Large left renal cyst. 4. Aortic atherosclerosis. 5.  Percutaneous gastrostomy tube in the stomach. Aortic Atherosclerosis (ICD10-I70.0). Electronically Signed   By: Lucienne Capers M.D.   On: 10/07/2019 19:30   DG Retrograde Pyelogram  Result Date: 10/08/2019 CLINICAL DATA:  Right retrograde pyelogram and stent placement. EXAM: RETROGRADE PYELOGRAM COMPARISON:  10/08/2019.  CT dated 10/07/2019 FINDINGS: The patient appears to have undergone right sided ureteral stent placement. Injection of contrast opacifies the collecting system. There is some contrast inferior to the expected region of the kidney which may be extraluminal. The total fluoroscopy time was 29 seconds. A single image was submitted for review. IMPRESSION: Right stent placement as detailed above. Electronically Signed   By: Constance Holster M.D.   On: 10/08/2019 17:21   DG Chest Portable 1 View  Result Date: 10/07/2019 CLINICAL DATA:  Altered mental status. History of respiratory failure. Tracheostomy. EXAM: PORTABLE CHEST 1 VIEW COMPARISON:  07/16/2019. FINDINGS: Tracheostomy tube in stable position. Stable cardiomegaly. Low lung volumes with bibasilar atelectasis and infiltrates. Slight improvement in aeration of the lung basis from prior exam. No prominent pleural effusion. No pneumothorax. IMPRESSION: 1.  Tracheostomy tube in stable position. 2. Low lung volumes with bibasilar atelectasis and infiltrates again noted. Slight improvement in aeration of the lung bases from prior exam. Electronically Signed   By: Marcello Moores  Register   On: 10/07/2019 14:11   DG C-Arm 1-60 Min  Result Date: 10/08/2019 CLINICAL DATA:  Ureteral stent placement EXAM: DG C-ARM 1-60 MIN FLUOROSCOPY TIME:  Fluoroscopy Time:  29 seconds Number of Acquired Spot Images: 1 COMPARISON:  CT dated 10/07/2019 FINDINGS: The patient appears to have undergone right sided ureteral stent placement. Injection of contrast opacifies the collecting system. There is some contrast inferior to the expected region of the kidney which may  be extraluminal. The total fluoroscopy time was 29 seconds. A single image was submitted for review. IMPRESSION: Stent placement as above. Please see separate operative report for complete intraoperative details. Electronically Signed   By: Constance Holster M.D.   On: 10/08/2019 17:22    Anti-infectives: Anti-infectives (From admission, onward)   Start     Dose/Rate Route Frequency Ordered Stop   10/08/19 1600  vancomycin (VANCOCIN) IVPB 1000 mg/200 mL premix        1,000 mg 200 mL/hr over 60 Minutes Intravenous Every 24 hours 10/07/19 1539     10/07/19 2100  piperacillin-tazobactam (ZOSYN) IVPB 3.375 g  Status:  Discontinued        3.375 g 12.5 mL/hr over 240 Minutes Intravenous Every 8 hours 10/07/19 1539 10/07/19 2001   10/07/19 2100  meropenem (MERREM) 2 g in sodium chloride 0.9 % 100 mL IVPB        2 g 200 mL/hr over 30 Minutes Intravenous Every 8 hours 10/07/19 2010     10/07/19 1500  vancomycin (VANCOREADY) IVPB 1250 mg/250 mL        1,250 mg 166.7 mL/hr over 90 Minutes Intravenous  Once 10/07/19 1421 10/07/19 1819   10/07/19 1415  piperacillin-tazobactam (ZOSYN) IVPB 3.375 g        3.375 g 100 mL/hr over 30 Minutes Intravenous  Once 10/07/19 1418 10/07/19 1513      Assessment/Plan: Sepsis Chronic vent dependence Atrial fibrillation.  Adynamic ileus  Urosepsis G tube to gravity. No evidence for obstruction GI is also consulting No role for surgical intervention.   Make sure electrolytes are corrected.   Will  sign off.  LOS: 2 days    Maia Petties 10/09/2019

## 2019-10-09 NOTE — Progress Notes (Signed)
PHARMACY - PHYSICIAN COMMUNICATION CRITICAL VALUE ALERT - BLOOD CULTURE IDENTIFICATION (BCID)  Luis Orr is an 69 y.o. male who presented to [facility] on 10/07/2019 with a chief complaint of urosepsis, R ureteral obstruction s/p stent placement, and ileus. Ucx growing klebsiella, MIC pending. Patient with hx of ESBL klebsiella.  Assessment: Bcx growing staph species, no resistance detected, in 1 of 2 aerobic bottles. WBC wlevated, LA and pct wnl. Patient is already on vancomycin, continue until further speciation.  Name of physician (or Provider) Contacted: Dr. Genevive Bi   Current antibiotics: Meropenem and vancomycin  Changes to prescribed antibiotics recommended:  Patient is on recommended antibiotics - No changes needed  No results found for this or any previous visit.  Benetta Spar, PharmD, BCPS, BCCP Clinical Pharmacist  Please check AMION for all [place] phone numbers After 10:00 PM, call [place] [phone]

## 2019-10-09 NOTE — Progress Notes (Signed)
[place] for Heparin Indication: atrial fibrillation  Allergies  Allergen Reactions  . Adhesive [Tape] Rash    Patient Measurements: Height: 5' 6"  (167.6 cm) Weight: 66.8 kg (147 lb 4.3 oz) IBW/kg (Calculated) : 63.8 Heparin Dosing Weight: 66.8 kg  Vital Signs: Temp: 97.5 F (36.4 C) (09/26 1549) BP: 127/84 (09/26 1800) Pulse Rate: 64 (09/26 1800)  Labs: Recent Labs    10/07/19 1425 10/07/19 1425 10/08/19 0257 10/08/19 0257 10/08/19 1301 10/08/19 1923 10/09/19 0244 10/09/19 0838 10/09/19 1816 10/09/19 1819  HGB 12.1*   < > 12.6*   < >  --  13.1  --   --   --  13.0  HCT 36.0*   < > 37.4*  --   --  38.3*  --   --   --  37.8*  PLT 234   < > 294  --   --  314  --   --   --  353  LABPROT  --   --  16.6*  --   --   --   --   --   --   --   INR  --   --  1.4*  --   --   --   --   --   --   --   HEPARINUNFRC  --   --  0.64   < > 0.29*  --   --  0.15* 0.25*  --   CREATININE 0.87  --  0.52*  --   --   --  0.47*  --   --   --    < > = values in this interval not displayed.    Estimated Creatinine Clearance: 78.6 mL/min (A) (by C-G formula based on SCr of 0.47 mg/dL (L)).   Medical History: Past Medical History:  Diagnosis Date  . Anemia   . Anxiety   . Chronic respiratory failure ([place])   . DM2 (diabetes mellitus, type 2) ([place])   . Functional quadriplegia (HCC)   . Hypertension   . Urine retention     Medications:  Scheduled:  . chlorhexidine  5 mL Mouth Rinse Q12H  . chlorhexidine gluconate (MEDLINE KIT)  15 mL Mouth Rinse BID  . Chlorhexidine Gluconate Cloth  6 each Topical Daily  . Chlorhexidine Gluconate Cloth  6 each Topical Daily  . insulin aspart  0-9 Units Subcutaneous Q4H  . insulin detemir  10 Units Subcutaneous BID  . mouth rinse  15 mL Mouth Rinse 10 times per day  . metoCLOPramide (REGLAN) injection  10 mg Intravenous Q6H  . olopatadine  1 drop Both Eyes BID  . pantoprazole (PROTONIX) IV  40 mg Intravenous  QHS    Assessment: Patient is a 54 yom that was being admitted for sepsis. Patient went into new onset afib with RVR. Pharmacy has been asked to dose heparin at this time.   Heparin level came back slightly subtherapeutic but trending up to 0.25, on 1200 units/hr. Hgb 13, plt 353. No s/sx of bleeding or infusion issues- minimal bleeding at sacral site, will monitor if worsens.  Goal of Therapy:  Heparin level 0.3-0.7 units/ml Monitor platelets by anticoagulation protocol: Yes   Plan:  Increase heparin infusion to 1300 units/hr Check 6-hr HL Monitor daily HL, CBC, s/sx bleeding  Antonietta Jewel, PharmD, BCCCP Clinical Pharmacist  Phone: [phone] 10/09/2019 7:30 PM  Please check AMION for all [place] phone numbers After 10:00 PM, call [place] [phone]

## 2019-10-10 ENCOUNTER — Encounter (HOSPITAL_COMMUNITY): Payer: Self-pay | Admitting: Urology

## 2019-10-10 LAB — BASIC METABOLIC PANEL
Anion gap: 10 (ref 5–15)
Anion gap: 11 (ref 5–15)
BUN: 6 mg/dL — ABNORMAL LOW (ref 8–23)
BUN: 7 mg/dL — ABNORMAL LOW (ref 8–23)
CO2: 21 mmol/L — ABNORMAL LOW (ref 22–32)
CO2: 19 mmol/L — ABNORMAL LOW (ref 22–32)
Calcium: 8.4 mg/dL — ABNORMAL LOW (ref 8.9–10.3)
Calcium: 8.4 mg/dL — ABNORMAL LOW (ref 8.9–10.3)
Chloride: 102 mmol/L (ref 98–111)
Chloride: 105 mmol/L (ref 98–111)
Creatinine, Ser: 0.41 mg/dL — ABNORMAL LOW (ref 0.61–1.24)
Creatinine, Ser: 0.4 mg/dL — ABNORMAL LOW (ref 0.61–1.24)
GFR calc Af Amer: >60 mL/min (ref >60)
GFR calc Af Amer: >60 mL/min (ref >60)
GFR calc non Af Amer: >60 mL/min (ref >60)
GFR calc non Af Amer: >60 mL/min (ref >60)
Glucose, Bld: 129 mg/dL — ABNORMAL HIGH (ref 70–99)
Glucose, Bld: 79 mg/dL (ref 70–99)
Potassium: 2.4 mmol/L — CL (ref 3.5–5.1)
Potassium: 2.8 mmol/L — ABNORMAL LOW (ref 3.5–5.1)
Sodium: 133 mmol/L — ABNORMAL LOW (ref 135–145)
Sodium: 135 mmol/L (ref 135–145)

## 2019-10-10 LAB — CBC
HCT: 37.9 % — ABNORMAL LOW (ref 39.0–52.0)
Hemoglobin: 13.3 g/dL (ref 13.0–17.0)
MCH: 24.6 pg — ABNORMAL LOW (ref 26.0–34.0)
MCHC: 35.1 g/dL (ref 30.0–36.0)
MCV: 70.1 fL — ABNORMAL LOW (ref 80.0–100.0)
Platelets: 343 10*3/uL (ref 150–400)
RBC: 5.41 MIL/uL (ref 4.22–5.81)
RDW: 15.7 % — ABNORMAL HIGH (ref 11.5–15.5)
WBC: 16.7 10*3/uL — ABNORMAL HIGH (ref 4.0–10.5)
nRBC: 0 % (ref 0.0–0.2)

## 2019-10-10 LAB — GLUCOSE, CAPILLARY
Glucose-Capillary: 110 mg/dL — ABNORMAL HIGH (ref 70–99)
Glucose-Capillary: 71 mg/dL (ref 70–99)
Glucose-Capillary: 72 mg/dL (ref 70–99)
Glucose-Capillary: 74 mg/dL (ref 70–99)
Glucose-Capillary: 80 mg/dL (ref 70–99)
Glucose-Capillary: 84 mg/dL (ref 70–99)
Glucose-Capillary: 84 mg/dL (ref 70–99)

## 2019-10-10 LAB — HEPARIN LEVEL (UNFRACTIONATED)
Heparin Unfractionated: 0.47 IU/mL (ref 0.30–0.70)
Heparin Unfractionated: 0.35 IU/mL (ref 0.30–0.70)

## 2019-10-10 LAB — MAGNESIUM: Magnesium: 1.6 mg/dL — ABNORMAL LOW (ref 1.7–2.4)

## 2019-10-10 MED ORDER — MAGNESIUM SULFATE 2 GM/50ML IV SOLN
2.0000 g | Freq: Once | INTRAVENOUS | Status: AC
  Administered 2019-10-10: 2 g via INTRAVENOUS
  Filled 2019-10-10: qty 50

## 2019-10-10 MED ORDER — NEOSTIGMINE METHYLSULFATE 10 MG/10ML IV SOLN
0.5000 mg | Freq: Four times a day (QID) | INTRAVENOUS | Status: DC
  Administered 2019-10-10 – 2019-10-11 (×5): 0.5 mg via SUBCUTANEOUS
  Filled 2019-10-10 (×8): qty 0.5

## 2019-10-10 MED ORDER — POTASSIUM CHLORIDE 10 MEQ/100ML IV SOLN
10.0000 meq | INTRAVENOUS | Status: AC
  Administered 2019-10-10 (×6): 10 meq via INTRAVENOUS
  Filled 2019-10-10 (×5): qty 100

## 2019-10-10 MED ORDER — POTASSIUM CHLORIDE 10 MEQ/100ML IV SOLN
10.0000 meq | INTRAVENOUS | Status: AC
  Administered 2019-10-10 (×6): 10 meq via INTRAVENOUS
  Filled 2019-10-10 (×4): qty 100

## 2019-10-10 MED ORDER — NEOSTIGMINE METHYLSULFATE 10 MG/10ML IV SOLN
0.5000 mg | Freq: Four times a day (QID) | INTRAVENOUS | Status: DC
  Filled 2019-10-10 (×2): qty 0.5

## 2019-10-10 NOTE — Progress Notes (Signed)
Pharmacy Antibiotic Note  Luis Orr is a 69 y.o. male admitted on 10/07/2019 with sepsis possibly d/t cellulitis.  Pharmacy has been consulted for Meropenem dosing.  Patient presented with AMS and fever. Treating for Klebsiella UTI. Renal function stable. Afebrile with downtrending WBC.  Plan: Continue Meropenem 2g q8h Monitor renal function and clinical progress   Temp (24hrs), Avg:97.8 F (36.6 C), Min:97.5 F (36.4 C), Max:98.1 F (36.7 C)  Recent Labs  Lab 10/07/19 1425 10/08/19 0257 10/08/19 1923 10/09/19 0244 10/09/19 1819 10/10/19 0245  WBC 19.2* 21.6* 21.7*  --  17.4* 16.7*  CREATININE 0.87 0.52*  --  0.47*  --  0.41*  LATICACIDVEN 1.2 1.1 0.7  --   --   --     Estimated Creatinine Clearance: 78.6 mL/min (A) (by C-G formula based on SCr of 0.41 mg/dL (L)).    Allergies  Allergen Reactions   Adhesive [Tape] Rash    Antimicrobials this admission: Vancomycin 9/24>>9/26 Zosyn 9/24>>9/24 Meropenem 9/24 >>    Microbiology results: 9/24 BCx: pending 9/24 UCx: pending 9/24 trach aspirate: pending  Thank you for allowing pharmacy to be a part of this patients care.  Norina Buzzard, PharmD PGY1 Pharmacy Resident 10/10/2019 2:21 PM  Please refer to [facility] for unit-specific pharmacist

## 2019-10-10 NOTE — Progress Notes (Signed)
[place] Progress Note Patient Name: Luis Orr DOB: 1950/04/03 MRN: [id number]   Date of Service  10/10/2019  HPI/Events of Note  Notified of K 2.4 and Mg 1.6 Creatinine 0.41  eICU Interventions  Ordered k 10 x 6 doses and Mg 1 g Repeat K level at 1 pm     Intervention Category Major Interventions: Electrolyte abnormality - evaluation and management  Judd Lien 10/10/2019, 4:24 AM

## 2019-10-10 NOTE — Plan of Care (Signed)

## 2019-10-10 NOTE — Progress Notes (Signed)
This is a progress note.    NAME:  Luis Orr, MRN:  [id number], DOB:  Feb 12, 1950, LOS: 3 ADMISSION DATE:  10/07/2019, CONSULTATION DATE: 10/07/2019 REFERRING MD: Dr. 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  Patient is a resident of [facility].  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  Patient is a resident of [facility].  Patient presented to the emergency department with distended abdomen.  Also has a history of recurrent ESBL Klebsiella pneumoniae UTIs in June and July.  Also history of MRSA bacteremia in June.  Upon presentation to the emergency department patient found to be febrile with a temperature of 100.7, white blood cell count 19,000 initial blood pressure systolic in the 52W.  Patient was given 2.5 L of lactated Ringer's with improvement in his blood pressure into the 140s.  Urinalysis revealed blood, greater than 50 white blood cells and many bacteria.  CT scan of the abdomen revealed distended colon chronic right ureteral junction obstruction, atrophy of the right kidney.  Which is chronic.  Pulmonary was consulted for recommendations and ventilator management as he is chronically on the vent.  Past Medical History   Past Medical History:  Diagnosis Date  . Anemia   . Anxiety   . Chronic respiratory failure ([place])   . DM2 (diabetes mellitus, type 2) ([place])   . Functional quadriplegia (HCC)   . Hypertension   . Urine retention      Significant Hospital Events     Consults:  Pulmonary critical care Surgery  Procedures:    Significant Diagnostic Tests:    Micro Data:    COVID-19 negative Prior history of ESBL Klebsiella, history of MRSA  Antimicrobials:  Meropenem plus vancomycin  Interim history/subjective:  No events, Remains in Afib/rvr  Objective   Blood pressure 123/84, pulse 98, temperature 97.6 F (36.4 C), temperature source Oral, resp. rate 14, height 5\' 6"  (1.676 m), weight 75.8 kg, SpO2 100 %.    Vent Mode: PRVC FiO2 (%):  [50 %] 50 % Set Rate:  [12 bmp] 12 bmp Vt Set:  [500 mL] 500 mL PEEP:  [5 cmH20] 5 cmH20 Plateau Pressure:  [21 cmH20-22 cmH20] 21 cmH20   Intake/Output Summary (Last 24 hours) at 10/10/2019 1251 Last data filed at 10/10/2019 1200 Gross per 24 hour  Intake 4326.64 ml  Output 1825 ml  Net 2501.64 ml   Filed Weights   10/07/19 1400 10/10/19 0600  Weight: 66.8 kg 75.8 kg    Examination: Constitutional: chronically debilitated man in no acute distress Eyes: eyes are anicteric, reactive to light Ears, nose, mouth, and throat: mucous membranes dry, tracheostomy tube in place, minimal secretions Cardiovascular: heart sounds are regular, ext are warm to touch. Global anasarca Respiratory: shallow inspiratory efforts, on PS on vent Gastrointestinal: abdomen is protuberant, hyper-resonant to percussion with nearly absent BS Skin: No rashes, normal turgor Neurologic: quadriplegic Psychiatric: RASS 0  K low: being repleted Sugars better WBC still elevated but better  Resolved Hospital Problem list     Assessment & Plan:   Chronic hypoxemic respiratory failure requiring tracheostomy tube and chronic mechanical ventilatory support. High cervical, C3 C5 compression, functional quadriplegic - Continue PS vs. AC depending on how patient does -  VAP prevention bundle  Severe sepsis secondary to klebsiella UTI possibly related to UPJ stone - Continue meropenem, f/u culture susceptibilities - 1/2 bottles staph in blood, suspect contaminant, repeat blood cx, low threshold to D/C vanc  Ileus- PEG to LIS, continue  trial of reglan, add neostigmine, GI following, no immediate plans for colonic decompression - Check AM KUB  Type 2 DM with hyperglycemia - DC levemir - Continue SSI  Afib/RVR- on dilt gtt, need PO access before we can wean - Continue heparin gtt - Await return of bowel function  Best practice:  Diet: NPO Pain/Anxiety/Delirium protocol (if indicated): Fentanyl PRN VAP protocol (if indicated): in place DVT prophylaxis: heparin gtt GI prophylaxis: PPI Glucose control: see above Mobility: quadriplegic Code Status: full Family Communication: updated patient, will call wife Disposition: ICU vs. [facility], not sure what they are able to do over there, RN/CM to find out    Erskine Emery MD [facility] 10/10/2019 12:51 PM Personal pager: [phone] If unanswered, please page CCM On-call: [phone]

## 2019-10-10 NOTE — Progress Notes (Signed)
Initial Nutrition Assessment  DOCUMENTATION CODES:   Not applicable  INTERVENTION:   When GI status allows, initiate tube feeding via PEG: Osmolite 1.5 at 50 ml/h (1200 ml per day) Prosource TF 45 ml QID  Provides 1960 kcal, 119 gm protein, 914 ml free water daily.  To meet fluid requirement, will need 175 ml free water flushes every 4 hours.   Recommend MVI with minerals via PEG tube daily.  Consider Juven BID via PEG tube when sepsis resolves.    NUTRITION DIAGNOSIS:   Increased nutrient needs related to wound healing as evidenced by estimated needs.  GOAL:   Patient will meet greater than or equal to 90% of their needs  MONITOR:   I & O's, Labs, Skin  REASON FOR ASSESSMENT:   Ventilator (hx PEG)    ASSESSMENT:   69 yo male admitted from [facility] with ileus, distended abdomen, sepsis from UTI. PMH includes chronic vent dependence, tracheostomy, PEG, functional quadriplegic d/t compressive cervical myelopathy, recent MRSA bacteremia (June) and ESBL Klebsiella pneumoniae UTIs (June & July).   GI following. Ileus suspected to be related to acute illness (RVR, renal stone, UTI). Plans to hold off on tube feeding today. PEG is to LIS. Receiving scheduled Reglan. Discussed patient in ICU rounds and with RN today.  Per review of home medications, patient was receiving Osmolite 1.5 at 50 ml/h via PEG PTA.  Patient is on chronic ventilator support via trach. MV: 7.4 L/min Temp (24hrs), Avg:97.8 F (36.6 C), Min:97.6 F (36.4 C), Max:98.1 F (36.7 C)   Labs reviewed. K 2.8 CBG: 72-84-80  Medications reviewed and include KCl, mag sulfate, IV meropenem.  Most recent weight available is from 07/17/19 (66.7 kg). Weight is up to 75.8 kg today. Weight has fluctuated from 66.7 kg to 81.6 kg over the past 5 months.   Per RN documentation, patient has moderate edema generalized and to all 4 extremities.  NUTRITION - FOCUSED PHYSICAL EXAM:  unable to complete  Diet  Order:   Diet Order            Diet NPO time specified  Diet effective now                 EDUCATION NEEDS:   No education needs have been identified at this time  Skin:  Skin Assessment: Skin Integrity Issues: Skin Integrity Issues:: Stage II, Stage III, Incisions Stage II: scrotum Stage III: sacrum Incisions: perineum  Last BM:  9/27 type 7, brown, mucous, green  Height:   Ht Readings from Last 1 Encounters:  10/08/19 5\' 6"  (1.676 m)    Weight:   Wt Readings from Last 1 Encounters:  10/10/19 75.8 kg    Ideal Body Weight:  64.5 kg  BMI:  Body mass index is 26.97 kg/m.  Estimated Nutritional Needs:   Kcal:  1900-2100  Protein:  115-135 gm  Fluid:  >/= 2 L    Lucas Mallow, RD, LDN, CNSC Please refer to Amion for contact information.                                                      '

## 2019-10-10 NOTE — TOC Initial Note (Signed)
Transition of Care [facility]) - Initial/Assessment Note    Patient Details  Name: GREGORY BARRICK MRN: [id number] Date of Birth: 1950/05/23  [facility]) CM/SW Contact:    Sharin Mons, RN Phone Number: [phone] 10/10/2019, 1:37 PM  Clinical Narrative:   Admitted with AMS/ UTI ,hx of chronic respiratory failure on a vent and trach, anemia, diabetes, functional quadriplegia, hypertension, recent ileus/ peg-TF. Pt  from [facility].   NCM spoke with pt's wife June by phone @ [phone]   regarding d/c planning , SNF vs LTAC.  Per wife she's ok with pt returning to [facility] or [facility] if needed. NCM spoke with [facility] liaison [place] and  LTAC referral made. Per Raquel Sarna, [facility] will review for LTAC  admit .Marland KitchenMarland Kitchenpt will need 3 ICU midnights . States can receive pt  tomorrow pending approval.   TOC team will continue to monitor and follow.....  Expected Discharge Plan: Long Term Acute Care (LTAC) Barriers to Discharge: Continued Medical Work up   Patient Goals and CMS Choice        Expected Discharge Plan and Services Expected Discharge Plan: Long Term Acute Care (LTAC)                                              Prior Living Arrangements/Services                       Activities of Daily Living      Permission Sought/Granted                  Emotional Assessment              Admission diagnosis:  Ileus ([place]) [K56.7] Sepsis secondary to UTI ([place]) [A41.9, N39.0] Urinary tract infection without hematuria, site unspecified [N39.0] Sepsis, due to unspecified organism, unspecified whether acute organ dysfunction present [facility]) [A41.9] Patient Active Problem List   Diagnosis Date Noted  . History of ESBL Klebsiella pneumoniae infection 10/07/2019  . History of MRSA infection 10/07/2019  . Adynamic ileus ([place]) 10/07/2019  . Sacral decubitus ulcer 10/07/2019  . AMS (altered mental status) 07/17/2019  . SIRS  (systemic inflammatory response syndrome) (HCC)   . Ventilator dependence ([place])   . Bacteremia due to Staphylococcus 07/07/2019  . Functional quadriplegia ([place]) 07/06/2019  . Chronic respiratory failure ([place]) 07/06/2019  . Sepsis secondary to UTI ([place]) 07/06/2019  . Fecal impaction ([place]) 07/06/2019  . HTN (hypertension) 07/06/2019  . DM2 (diabetes mellitus, type 2) ([place]) 07/06/2019   PCP:  Townsend Roger, MD Pharmacy:   [facility], [address] [place] [place] [address] Phone: [phone] Fax: [phone]     Social Determinants of Health (SDOH) Interventions    Readmission Risk Interventions No flowsheet data found.

## 2019-10-10 NOTE — Progress Notes (Signed)
Called wife ,no answer

## 2019-10-10 NOTE — Progress Notes (Addendum)
[facility] for Heparin Indication: atrial fibrillation  Allergies  Allergen Reactions  . Adhesive [Tape] Rash    Patient Measurements: Height: 5\' 6"  (167.6 cm) Weight: 75.8 kg (167 lb 1.7 oz) IBW/kg (Calculated) : 63.8 Heparin Dosing Weight: 66.8 kg  Vital Signs: Temp: 97.8 F (36.6 C) (09/27 0352) Temp Source: Axillary (09/27 0352) BP: 123/81 (09/27 0730) Pulse Rate: 43 (09/27 0730)  Labs: Recent Labs    10/08/19 0257 10/08/19 1301 10/08/19 1923 10/08/19 1923 10/09/19 0244 10/09/19 0838 10/09/19 1816 10/09/19 1819 10/10/19 0245  HGB 12.6*  --  13.1   < >  --   --   --  13.0 13.3  HCT 37.4*  --  38.3*  --   --   --   --  37.8* 37.9*  PLT 294  --  314  --   --   --   --  353 343  LABPROT 16.6*  --   --   --   --   --   --   --   --   INR 1.4*  --   --   --   --   --   --   --   --   HEPARINUNFRC 0.64   < >  --   --   --  0.15* 0.25*  --  0.47  CREATININE 0.52*  --   --   --  0.47*  --   --   --  0.41*   < > = values in this interval not displayed.    Estimated Creatinine Clearance: 78.6 mL/min (A) (by C-G formula based on SCr of 0.41 mg/dL (L)).  Assessment: Patient is a 31 yom that was being admitted for sepsis. Patient went into new onset afib with RVR. Pharmacy has been asked to dose heparin at this time.   Heparin level came back therapeutic at 0.47 on 1300 units/hr. CBC stable. No s/sx of infusion issues- minimal bleeding at sacral site, no worsening noted. Will continue to monitor  Goal of Therapy:  Heparin level 0.3-0.7 units/ml Monitor platelets by anticoagulation protocol: Yes   Plan:  Continue heparin infusion at 1300 units/hr Check 6-hr HL Monitor daily HL, CBC, s/sx bleeding  Norina Buzzard, PharmD PGY1 Pharmacy Resident 10/10/2019 7:43 AM  Please check AMION for all [place] phone numbers After 10:00 PM, call [place] [phone]  Addendum: Confirmatory 6-hour level therapeutic at 0.35 Will continue  heparin at 1300 units/hr and check daily CBC/HL

## 2019-10-10 NOTE — Progress Notes (Addendum)
Progress Note   Subjective  Chief Complaint: Ileus, sepsis from UTI, vent dependent  Patient continues on vent, opens his eyes to my voice but otherwise is unable to provide any history.  Per nursing he had some green mucousy stool yesterday but this is stopped overnight.  Remains distended, no acute changes.   Objective   Vital signs in last 24 hours: Temp:  [97.2 F (36.2 C)-98.1 F (36.7 C)] 97.7 F (36.5 C) (09/27 0832) Pulse Rate:  [28-155] 89 (09/27 1000) Resp:  [12-24] 14 (09/27 1000) BP: (84-151)/(70-113) 127/83 (09/27 1000) SpO2:  [95 %-100 %] 100 % (09/27 1000) FiO2 (%):  [50 %-60 %] 50 % (09/27 0738) Weight:  [75.8 kg] 75.8 kg (09/27 0600) Last BM Date: 10/09/19 General:  male in NAD Heart:  Regular rate and rhythm; no murmurs Lungs: Respirations even and unlabored, lungs CTA bilaterally +intubated on vent Abdomen:  Tense, non-tender, marked distension, decreased hypertympanic BS all four quadrants  Lab Results: Recent Labs    10/08/19 1923 10/09/19 1819 10/10/19 0245  WBC 21.7* 17.4* 16.7*  HGB 13.1 13.0 13.3  HCT 38.3* 37.8* 37.9*  PLT 314 353 343   BMET Recent Labs    10/08/19 0257 10/09/19 0244 10/10/19 0245  NA 135 131* 133*  K 3.1* 3.3* 2.4*  CL 106 103 102  CO2 17* 16* 21*  GLUCOSE 102* 254* 129*  BUN 16 11 6*  CREATININE 0.52* 0.47* 0.41*  CALCIUM 8.7* 8.2* 8.4*   LFT Recent Labs    10/08/19 0257  PROT 7.2  ALBUMIN 2.9*  AST 13*  ALT 14  ALKPHOS 74  BILITOT 1.2   PT/INR Recent Labs    10/08/19 0257  LABPROT 16.6*  INR 1.4*    Studies/Results: DG Retrograde Pyelogram  Result Date: 10/08/2019 CLINICAL DATA:  Right retrograde pyelogram and stent placement. EXAM: RETROGRADE PYELOGRAM COMPARISON:  10/08/2019.  CT dated 10/07/2019 FINDINGS: The patient appears to have undergone right sided ureteral stent placement. Injection of contrast opacifies the collecting system. There is some contrast inferior to the expected region of  the kidney which may be extraluminal. The total fluoroscopy time was 29 seconds. A single image was submitted for review. IMPRESSION: Right stent placement as detailed above. Electronically Signed   By: Constance Holster M.D.   On: 10/08/2019 17:21   DG Abd Portable 1V  Result Date: 10/09/2019 CLINICAL DATA:  Ileus EXAM: PORTABLE ABDOMEN - 1 VIEW COMPARISON:  Yesterday FINDINGS: Diffuse gas dilated bowel affecting colon and to a lesser extent small bowel. Degree of distention is stable or mildly progressed from yesterday. The distal transverse segment measures up to 7.5 cm. Detection of pneumoperitoneum is not reliable on this supine study. Right ureteral stent in expected position. IMPRESSION: Ileus with stable to progressed bowel distension. Electronically Signed   By: Monte Fantasia M.D.   On: 10/09/2019 10:38   DG C-Arm 1-60 Min  Result Date: 10/08/2019 CLINICAL DATA:  Ureteral stent placement EXAM: DG C-ARM 1-60 MIN FLUOROSCOPY TIME:  Fluoroscopy Time:  29 seconds Number of Acquired Spot Images: 1 COMPARISON:  CT dated 10/07/2019 FINDINGS: The patient appears to have undergone right sided ureteral stent placement. Injection of contrast opacifies the collecting system. There is some contrast inferior to the expected region of the kidney which may be extraluminal. The total fluoroscopy time was 29 seconds. A single image was submitted for review. IMPRESSION: Stent placement as above. Please see separate operative report for complete intraoperative details. Electronically Signed  By: Constance Holster M.D.   On: 10/08/2019 17:22    Assessment / Plan:   Assessment: 1.  Ileus: Continues with ileus, though nursing reported some green mucousy discharge yesterday, abdomen distended and hypertympanic, abdominal x-ray yesterday with stable ileus and progressed bowel distention 2.  Sepsis from UTI 3.  Vent dependent  Plan: 1.  Continue antibiotics 2.  Correct potassium 3. Please await any further  recommendations from Dr. Ardis Hughs later today  Thank you for your kind consultation, we will continue to follow.   LOS: 3 days   Levin Erp  10/10/2019, 11:21 AM  ________________________________________________________________________  Velora Heckler GI MD note:  I personally examined the patient, reviewed the data and agree with the assessment and plan described above.  He is vent dependent with a trach, quadrapalegic baseline. G tube for nutrition. His ileus is probably related to his acute illness (RVR, renal stone and UTI). I hope with time his bowel function will return. It will be very difficult to treat ileus otherwise since his is really completely immobile.  He is already on scheduled reglan (10mg  QID).  Should try to completely limit any narcotic meds as best as possible. Will recheck kub in the am.    Owens Loffler, MD [facility] Pager [phone]

## 2019-10-11 ENCOUNTER — Inpatient Hospital Stay (HOSPITAL_COMMUNITY): Payer: Medicare Other

## 2019-10-11 LAB — CULTURE, BLOOD (ROUTINE X 2)

## 2019-10-11 LAB — CBC
HCT: 37.3 % — ABNORMAL LOW (ref 39.0–52.0)
Hemoglobin: 13.1 g/dL (ref 13.0–17.0)
MCH: 24.2 pg — ABNORMAL LOW (ref 26.0–34.0)
MCHC: 35.1 g/dL (ref 30.0–36.0)
MCV: 68.9 fL — ABNORMAL LOW (ref 80.0–100.0)
Platelets: 313 10*3/uL (ref 150–400)
RBC: 5.41 MIL/uL (ref 4.22–5.81)
RDW: 15.6 % — ABNORMAL HIGH (ref 11.5–15.5)
WBC: 13.1 10*3/uL — ABNORMAL HIGH (ref 4.0–10.5)
nRBC: 0 % (ref 0.0–0.2)

## 2019-10-11 LAB — BASIC METABOLIC PANEL
Anion gap: 10 (ref 5–15)
BUN: 5 mg/dL — ABNORMAL LOW (ref 8–23)
CO2: 21 mmol/L — ABNORMAL LOW (ref 22–32)
Calcium: 8.4 mg/dL — ABNORMAL LOW (ref 8.9–10.3)
Chloride: 105 mmol/L (ref 98–111)
Creatinine, Ser: <0.3 mg/dL — ABNORMAL LOW (ref 0.61–1.24)
Glucose, Bld: 70 mg/dL (ref 70–99)
Potassium: 4.1 mmol/L (ref 3.5–5.1)
Sodium: 136 mmol/L (ref 135–145)

## 2019-10-11 LAB — GLUCOSE, CAPILLARY
Glucose-Capillary: 87 mg/dL (ref 70–99)
Glucose-Capillary: 85 mg/dL (ref 70–99)
Glucose-Capillary: 113 mg/dL — ABNORMAL HIGH (ref 70–99)
Glucose-Capillary: 145 mg/dL — ABNORMAL HIGH (ref 70–99)

## 2019-10-11 LAB — HEPARIN LEVEL (UNFRACTIONATED): Heparin Unfractionated: 0.66 IU/mL (ref 0.30–0.70)

## 2019-10-11 LAB — MAGNESIUM: Magnesium: 2 mg/dL (ref 1.7–2.4)

## 2019-10-11 MED ORDER — ENOXAPARIN SODIUM 120 MG/0.8ML ~~LOC~~ SOLN
120.0000 mg | SUBCUTANEOUS | Status: DC
  Administered 2019-10-11: 120 mg via SUBCUTANEOUS
  Filled 2019-10-11: qty 0.8

## 2019-10-11 MED ORDER — ENOXAPARIN SODIUM 120 MG/0.8ML ~~LOC~~ SOLN: 120.0000 mg | SUBCUTANEOUS | Status: DC

## 2019-10-11 MED ORDER — VITAL HIGH PROTEIN PO LIQD
1000.0000 mL | ORAL | Status: DC
  Administered 2019-10-11: 1000 mL

## 2019-10-11 MED ORDER — LORAZEPAM 2 MG/ML IJ SOLN: 0.5000 mg | INTRAMUSCULAR | 0 refills | Status: AC | PRN

## 2019-10-11 MED ORDER — METOCLOPRAMIDE HCL 5 MG/ML IJ SOLN: 10.0000 mg | Freq: Four times a day (QID) | INTRAMUSCULAR | 0 refills | Status: DC

## 2019-10-11 MED ORDER — SODIUM CHLORIDE 0.9 % IV SOLN: 2.0000 g | Freq: Three times a day (TID) | INTRAVENOUS | Status: DC

## 2019-10-11 MED ORDER — DILTIAZEM HCL-DEXTROSE 125-5 MG/125ML-% IV SOLN (PREMIX): 5.0000 mg/h | INTRAVENOUS | Status: DC

## 2019-10-11 MED ORDER — ONDANSETRON HCL 4 MG/2ML IJ SOLN: 4.0000 mg | Freq: Four times a day (QID) | INTRAMUSCULAR | 0 refills | Status: AC | PRN

## 2019-10-11 MED ORDER — OSMOLITE 1.5 CAL PO LIQD
1000.0000 mL | ORAL | Status: DC
  Administered 2019-10-11: 1000 mL

## 2019-10-11 MED ORDER — PANTOPRAZOLE SODIUM 40 MG IV SOLR: 40.0000 mg | Freq: Every day | INTRAVENOUS | 0 refills | Status: AC

## 2019-10-11 MED ORDER — AMIODARONE HCL IN DEXTROSE 360-4.14 MG/200ML-% IV SOLN: 30.0000 mg/h | INTRAVENOUS | Status: DC

## 2019-10-11 NOTE — Progress Notes (Signed)
Nutrition Follow-up  DOCUMENTATION CODES:   Not applicable  INTERVENTION:   Trickle tube feeds via PEG: - Osmolite 1.5 @ 20 ml/hr (480 ml/day) to provide 720 kcal and 30 grams of protein  RD will monitor for ability to advance to goal tube feeding regimen: - Osmolite 1.5 @ 50 ml/hr (1200 ml/day) - ProSource TF 45 ml QID  Goal tube feeding regimen would provide 1960 kcal, 119 grams of protein, and 914 ml of H2O.   -  Recommend MVI with minerals via PEG tube daily  - Recommend addition of Juven BID per tube once sepsis resolves  NUTRITION DIAGNOSIS:   Increased nutrient needs related to wound healing as evidenced by estimated needs.  Ongoing  GOAL:   Patient will meet greater than or equal to 90% of their needs  Unmet at this time, pt on trickle tube feeds  MONITOR:   I & O's, Labs, Skin  REASON FOR ASSESSMENT:   Consult Enteral/tube feeding initiation and management  ASSESSMENT:   69 yo male admitted from [facility] with ileus, distended abdomen, sepsis from UTI. PMH includes chronic vent dependence, tracheostomy, PEG, functional quadriplegic d/t compressive cervical myelopathy, recent MRSA bacteremia (June) and ESBL Klebsiella pneumoniae UTIs (June & July).  RD consulted for intiation of trickle tube feeds. Discussed pt with RN and during ICU rounds. Ileus improving per CCM.  Current TF: Vital High Protein @ 15 ml/hr (order is for 20 ml/hr)  RD will adjust tube feeding formula to pt's baseline formula.  Patient is on chronic ventilator support via trach MV: 7.6 L/min Temp (24hrs), Avg:97.6 F (36.4 C), Min:97.5 F (36.4 C), Max:97.7 F (36.5 C)  Drips: Amiodarone Cardizem Heparin LR @ 75 ml/hr  Medications reviewed and include: SSI q 4 hours, IV reglan 10 mg q 6 hours, neostigmine, protonix, IV abx  Labs reviewed. CBG's: 71-87 x 24 hours  UOP: 3325 ml x 24 hours PEG tube: 200 ml x 24 hours I/O's: +2.4 L since admit  Diet Order:   Diet Order             Diet NPO time specified  Diet effective now                 EDUCATION NEEDS:   No education needs have been identified at this time  Skin:  Skin Assessment: Skin Integrity Issues: Stage II: scrotum Stage III: sacrum Incisions: perineum  Last BM:  10/11/19 large type 6  Height:   Ht Readings from Last 1 Encounters:  10/08/19 5\' 6"  (1.676 m)    Weight:   Wt Readings from Last 1 Encounters:  10/10/19 75.8 kg    Ideal Body Weight:  64.5 kg  BMI:  Body mass index is 26.97 kg/m.  Estimated Nutritional Needs:   Kcal:  1900-2100  Protein:  115-135 gm  Fluid:  >/= 2 L    Gaynell Face, MS, RD, LDN Inpatient Clinical Dietitian Please see AMiON for contact information.

## 2019-10-11 NOTE — Progress Notes (Signed)
NCM received call from [facility], [place]. Raquel Sarna made NCM aware pt has been approved for [facility]. Bed will be available after 4pm today.NCM to make nurse and MD aware. Whitman Hero RN,BSN,CM

## 2019-10-11 NOTE — Discharge Summary (Signed)
Physician Discharge Summary  Patient ID: Luis Orr MRN: [id number] DOB/AGE: 70/25/1952 70 y.o.  Admit date: 10/07/2019 Discharge date: 10/11/2019  Admission Diagnoses: Obstructive Uropathy Ileus improved Baseline ventilator dependence  Discharge Diagnoses:  Principal Problem:   Sepsis secondary to UTI [facility]) Active Problems:   Functional quadriplegia (HCC)   Chronic respiratory failure (HCC)   HTN (hypertension)   DM2 (diabetes mellitus, type 2) (HCC)   AMS (altered mental status)   Ventilator dependence ([place])   History of ESBL Klebsiella pneumoniae infection   History of MRSA infection   Adynamic ileus ([place])   Sacral decubitus ulcer   Discharged Condition: stable  Hospital Course:  Patient was admitted with sepsis and positive UA.  Imaging revealed right uretal calculus for which pateint underwent double-J ureteral stent placement on 10/08/19.    Postoperative course complicated by ileus which improved with reglan and subcutaneous neostimine.  Abdomen is currently soft and tube feeds are being advanced as tolerated.  Plan as follows:  Afib: dilt and amio drip, convert to PO when ileus is confirmed improved. Lovenox for now then transition to NoAC once has enteral absoprtion.  Anxiety: IV ativan given PRN then can switch back to clonazepam when GI absorption improved  Vent, quadriplegia: continue vent.  R UPJ stone: urology will reach out regarding timing of repeat ureteroscopy, keep foley for now  Sepsis due to MDR Klebsiella: continue meropenem through 10/17/19  Consults: urology  Significant Diagnostic Studies: KUB 9/28 IMPRESSION: 1. Percutaneous gastrostomy tube and double-J right ureteral stent in stable position. 2. Persistent bowel distention, primarily colonic distention, is again noted without interim change. Findings most consistent with adynamic ileus. Continued follow-up exams to demonstrate resolution suggested.  Treatments: IV hydration  and antibiotics: meropenem through 10/17/19  Discharge Exam: Blood pressure (!) 149/96, pulse 97, temperature 97.7 F (36.5 C), temperature source Oral, resp. rate 13, height 5\' 6"  (1.676 m), weight 75.8 kg, SpO2 100 %. Constitutional: chronically debilitated man in no acute distress Eyes: eyes are anicteric, reactive to light Ears, nose, mouth, and throat: mucous membranes dry, tracheostomy tube in place, minimal secretions Cardiovascular: heart sounds are irregular, ext are warm to touch. Global anasarca Respiratory: shallow inspiratory efforts, on PS on vent Gastrointestinal: abdomen is softer today, hypoactive BS Skin: No rashes, normal turgor Neurologic: quadriplegic Psychiatric: RASS 0  Disposition: [facility]   Allergies as of 10/11/2019      Reactions   Adhesive [tape] Rash      Medication List    STOP taking these medications   amLODipine 10 MG tablet Commonly known as: NORVASC   cloNIDine 0.1 MG tablet Commonly known as: CATAPRES   Levemir FlexTouch 100 UNIT/ML FlexPen Generic drug: insulin detemir   Mi-Acid Gas Relief 80 MG chewable tablet Generic drug: simethicone   scopolamine 1 MG/3DAYS Commonly known as: TRANSDERM-SCOP     TAKE these medications   acetaminophen 160 MG/5ML solution Commonly known as: TYLENOL Place 20.3 mLs (650 mg total) into feeding tube every 6 (six) hours as needed for mild pain (or Fever >/= 101).   acetaminophen 650 MG suppository Commonly known as: TYLENOL Place 1 suppository (650 mg total) rectally every 6 (six) hours as needed for mild pain (or Fever >/= 101).   amiodarone 360-4.14 MG/200ML-% Soln Commonly known as: NEXTERONE PREMIX Inject 30 mg/hr into the vein continuous. Transition to beta blocker or PO amiodarone when enteral absorption confirmed   chlorhexidine 0.12 % solution Commonly known as: PERIDEX 5 mLs by Mouth Rinse route every  12 (twelve) hours.   clonazePAM 0.5 MG tablet Commonly known as:  KLONOPIN Place 1 tablet (0.5 mg total) into feeding tube every 8 (eight) hours.   dilTIAZem HCl-Dextrose 125-5 MG/125ML-% Soln infusion Inject 5-15 mg/hr into the vein continuous. Titrate to HR < 110 Hold uptitrating if MAP < 70   docusate 50 MG/5ML liquid Commonly known as: COLACE Place 10 mLs (100 mg total) into feeding tube every 12 (twelve) hours.   enoxaparin 120 MG/0.8ML injection Commonly known as: LOVENOX Inject 0.8 mLs (120 mg total) into the skin daily. Transition to NoAC once enteral access improved Start taking on: October 12, 2019   famotidine 20 MG tablet Commonly known as: PEPCID Place 1 tablet (20 mg total) into feeding tube at bedtime. What changed: when to take this   feeding supplement (OSMOLITE 1.5 CAL) Liqd Place 1,000 mLs into feeding tube continuous. What changed:   how much to take  when to take this  additional instructions   fentaNYL 25 MCG/HR Commonly known as: [place] 1 patch onto the skin every 3 (three) days.   gabapentin 250 MG/5ML solution Commonly known as: NEURONTIN Place 6 mLs (300 mg total) into feeding tube 2 (two) times daily.   HYDROcodone-acetaminophen 7.5-325 MG tablet Commonly known as: [place] 1 tablet into feeding tube 2 (two) times daily.   insulin regular 100 units/mL injection Commonly known as: NOVOLIN R Inject 2-10 Units into the skin See admin instructions. Inject 2-10 units into the skin every 6 hours, per sliding scale: BGL 151-200 = 2 units, 201-250= 4 units, 251-300= 6 units, 301-350= 8 units, 351-400 = 10 units and call MD if <60 or >400   ipratropium-albuterol 0.5-2.5 (3) MG/3ML Soln Commonly known as: DUONEB Take 3 mLs by nebulization every 4 (four) hours as needed (for shortness of breath).   lidocaine 5 % Commonly known as: LIDODERM Place 1 patch onto the skin daily. Remove & Discard patch within 12 hours or as directed by MD   lisinopril 5 MG tablet Commonly known as: ZESTRIL Take 1  tablet (5 mg total) by mouth daily. What changed: how to take this   LORazepam 2 MG/ML injection Commonly known as: ATIVAN Inject 0.25 mLs (0.5 mg total) into the vein every 4 (four) hours as needed for anxiety. Replace with clonazepam once enteral absorption confirmed.   meropenem 2 g in sodium chloride 0.9 % 100 mL Inject 2 g into the vein every 8 (eight) hours.   metoCLOPramide 5 MG/ML injection Commonly known as: REGLAN Inject 2 mLs (10 mg total) into the vein every 6 (six) hours.   multivitamin with minerals Tabs tablet Take 1 tablet by mouth daily. What changed: how to take this   NORMAL SALINE FLUSH IV Inject 10 mLs into the vein See admin instructions. 10 ml's as needed before and after medications   Olopatadine HCl 0.2 % Soln Place 1 drop into both eyes daily.   ondansetron 4 MG disintegrating tablet Commonly known as: ZOFRAN-ODT 4 mg See admin instructions. Dissolve 4 mg into g-tube every 6 hours as needed for nausea   ondansetron 4 MG/2ML Soln injection Commonly known as: ZOFRAN Inject 2 mLs (4 mg total) into the vein every 6 (six) hours as needed for nausea.   pantoprazole 40 MG injection Commonly known as: PROTONIX Inject 40 mg into the vein at bedtime.   polyethylene glycol 17 g packet Commonly known as: MIRALAX / GLYCOLAX Take 17 g by mouth daily. What changed: how to take  this   potassium chloride 20 MEQ/15ML (10%) Soln Place 20 mEq into feeding tube daily.   sertraline 100 MG tablet Commonly known as: ZOLOFT Place 1 tablet (100 mg total) into feeding tube at bedtime.       Follow-up Information    Lucas Mallow, MD Follow up.   Specialty: Urology Why: They will call to make an appointment for uretoroscopy. Contact information: [place] [id number] [phone]               Signed: Candee Furbish 10/11/2019, 2:49 PM

## 2019-10-11 NOTE — Progress Notes (Addendum)
[facility] Progress Note  CC:  Ileus   Subjective: Patient has a trache on the vent. No family at the bed side. His RN Rod Holler reported he passed a watery liquid green brown stool moderate amount this am. No bloody diarrhea. Tube feeding started at 15cc/hr via G tube this am. No family at the bedside.    Objective:   Abdominal xray 10/11/2019: 1. Percutaneous gastrostomy tube and double-J right ureteral stent in stable position. 2. Persistent bowel distention, primarily colonic distention, is again noted without interim change. Findings most consistent with adynamic ileus. Continued follow-up exams to demonstrate resolution Suggested.  Vital signs in last 24 hours: Temp:  [97.5 F (36.4 C)-97.7 F (36.5 C)] 97.7 F (36.5 C) (09/28 0843) Pulse Rate:  [54-126] 86 (09/28 0900) Resp:  [12-23] 18 (09/28 0900) BP: (102-155)/(76-116) 155/92 (09/28 0900) SpO2:  [93 %-100 %] 100 % (09/28 0900) FiO2 (%):  [40 %-50 %] 40 % (09/28 0900) Last BM Date: 10/10/19   General:  69 year old male on the Vent, awake, mouthing words.  Heart: Irregular rhythm, no murmur.  Pulm:  Coarse breath sounds throughout.  Abdomen: Distended but soft. Positive BS x 4 quads. No mass. G tube high central upper abdomen intact.  Extremities:  Upper and lower extremities with 2+ pitting edema.  Neurologic:  Awake. Quadriplegic.  Psych:  Alert and cooperative. Normal mood and affect.  Lab Results: Recent Labs    10/09/19 1819 10/10/19 0245 10/11/19 0722  WBC 17.4* 16.7* 13.1*  HGB 13.0 13.3 13.1  HCT 37.8* 37.9* 37.3*  PLT 353 343 313   BMET Recent Labs    10/10/19 0245 10/10/19 1426 10/11/19 0226  NA 133* 135 136  K 2.4* 2.8* 4.1  CL 102 105 105  CO2 21* 19* 21*  GLUCOSE 129* 79 70  BUN 6* 7* 5*  CREATININE 0.41* 0.40* <0.30*  CALCIUM 8.4* 8.4* 8.4*    DG Abd 1 View  Result Date: 10/11/2019 CLINICAL DATA:  Ileus. EXAM: ABDOMEN - 1 VIEW COMPARISON:  10/09/2019. FINDINGS:  Percutaneous gastrostomy tube and double-J right ureteral stent stable position. Persistent bowel distention, primarily colonic distention is again noted without interim change. Findings most consistent adynamic ileus. Continued follow-up exam suggested to exclude bowel obstruction. Bibasilar atelectasis. No acute bony abnormality. Degenerative change thoracic spine. IMPRESSION: 1. Percutaneous gastrostomy tube and double-J right ureteral stent in stable position. 2. Persistent bowel distention, primarily colonic distention, is again noted without interim change. Findings most consistent with adynamic ileus. Continued follow-up exams to demonstrate resolution suggested. Electronically Signed   By: Marcello Moores  Register   On: 10/11/2019 06:29    Assessment / Plan:  40. 69 year old male with quadraplegia, vent dependent admitted to the hospital from [facility] with AMS and abdominal distension. He was diagnosed with an ileus. Abdominal xray today showed persistent bowel distension, primarily colonic distension consistent with adynamic ileus. His RN today reports his abdomen is less distended today. He passed a watery BM last night and this am.  Today K+ 4.1. Magnesium 2.0.  -Continue Reglan IV for now -Agree with initiation of trickle  tube feedings  -Turn patient Q 2 hours -Consider placement of rectal tube if colonic distension worsens.  -Repeat CMP in am  2. Sepsis secondary to UTI/right hydronephrosis with proximal obstructing stone s/p right ureteral stent placement 9/25. WBC 13.1.   Further recommendations per Dr. Ardis Hughs     LOS: 4 days   Noralyn Pick  10/11/2019, 11:02 AM   ________________________________________________________________________  Velora Heckler GI MD note:  I personally examined the patient, reviewed the data and agree with the assessment and plan described above.  Possibly a bit of improvement in past 24 hours. Agree that trying to mobilize him by turning every 2  hours may expedite recovery and will consider rectal tube. Keep his electrolytes WNL.     Owens Loffler, MD [facility] Pager [phone]

## 2019-10-11 NOTE — TOC Transition Note (Addendum)
Transition of Care [facility]) - CM/SW Discharge Note   Patient Details  Name: Luis Orr MRN: [id number] Date of Birth: 1950/02/16  Transition of Care [facility]) CM/SW Contact:  Sharin Mons, RN Phone Number: [phone] 10/11/2019, 10:41 AM   Clinical Narrative:    Patient will DC to: [facility] Anticipated DC date: 10/11/2019 Family notified: wife, June Transport by: Karen Chafe   Per MD patient ready for DC to [facility]  Today. RN, patient, patient's wife, and facility notified of DC. Discharge Summary pending . LTAC bed will be available @ 4pm. RN to call report prior to discharge [phone]). ICU Rm #[id number]. Receiving MD: Nona Dell.  DC packet on chart. Ambulance transport / [facility] requested for patient. [facility] transport form will be place on front of chart per NCM. MD / nurse aware to complete EMTALA prior to d/c.   RNCM will sign off for now as intervention is no longer needed. Please consult [place] again if new needs arise.   Final next level of care: Long Term Acute Care (LTAC) Barriers to Discharge: No Barriers Identified   Patient Goals and CMS Choice        Discharge Placement                       Discharge Plan and Services                                     Social Determinants of Health (SDOH) Interventions     Readmission Risk Interventions No flowsheet data found.

## 2019-10-11 NOTE — Progress Notes (Signed)
This is a progress note.    NAME:  Luis Orr, MRN:  [id number], DOB:  12-08-1950, LOS: 4 ADMISSION DATE:  10/07/2019, CONSULTATION DATE: 10/07/2019 REFERRING MD: Dr. 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  Patient is a resident of [facility].  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  Patient is a resident of [facility].  Patient presented to the emergency department with distended abdomen.  Also has a history of recurrent ESBL Klebsiella pneumoniae UTIs in June and July.  Also history of MRSA bacteremia in June.  Upon presentation to the emergency department patient found to be febrile with a temperature of 100.7, white blood cell count 19,000 initial blood pressure systolic in the 29N.  Patient was given 2.5 L of lactated Ringer's with improvement in his blood pressure into the 140s.  Urinalysis revealed blood, greater than 50 white blood cells and many bacteria.  CT scan of the abdomen revealed distended colon chronic right ureteral junction obstruction, atrophy of the right kidney.  Which is chronic.  Pulmonary was consulted for recommendations and ventilator management as he is chronically on the vent.  Past Medical History   Past Medical History:  Diagnosis Date  . Anemia   . Anxiety   . Chronic respiratory failure ([place])   . DM2 (diabetes mellitus, type 2) ([place])   . Functional quadriplegia (HCC)   . Hypertension   . Urine retention      Significant Hospital Events     Consults:  Pulmonary critical care Surgery  Procedures:    Significant Diagnostic Tests:    Micro Data:    COVID-19 negative Prior history of ESBL Klebsiella, history of MRSA  Antimicrobials:  Meropenem plus vancomycin  Interim history/subjective:  No events, Remains in Afib/rvr  Objective   Blood pressure (!) 152/89, pulse (!) 101, temperature 97.7 F (36.5 C), temperature source Oral, resp. rate 16, height 5\' 6"  (1.676 m), weight 75.8 kg, SpO2 99 %.    Vent Mode: PRVC FiO2 (%):  [40 %-50 %] 40 % Set Rate:  [12 bmp] 12 bmp Vt Set:  [500 mL] 500 mL PEEP:  [5 cmH20] 5 cmH20 Plateau Pressure:  [15 cmH20-21 cmH20] 15 cmH20   Intake/Output Summary (Last 24 hours) at 10/11/2019 9892 Last data filed at 10/10/2019 2239 Gross per 24 hour  Intake 2020.6 ml  Output 2326 ml  Net -305.4 ml   Filed Weights   10/07/19 1400 10/10/19 0600  Weight: 66.8 kg 75.8 kg    Examination: Constitutional: chronically debilitated man in no acute distress Eyes: eyes are anicteric, reactive to light Ears, nose, mouth, and throat: mucous membranes dry, tracheostomy tube in place, minimal secretions Cardiovascular: heart sounds are irregular, ext are warm to touch. Global anasarca Respiratory: shallow inspiratory efforts, on PS on vent Gastrointestinal: abdomen is softer today, hypoactive BS Skin: No rashes, normal turgor Neurologic: quadriplegic Psychiatric: RASS 0  Sugars a bit low CBC stable  Resolved Hospital Problem list     Assessment & Plan:   Chronic hypoxemic respiratory failure requiring tracheostomy tube and chronic mechanical ventilatory support. High cervical, C3 C5 compression, functional quadriplegic -  Continue vent support - VAP prevention bundle  Severe sepsis secondary to klebsiella UTI possibly  related to UPJ stone - Continue meropenem x 7 days - Will need ureteroscopy in a couple weeks per Dr. Purvis Sheffield note  Ileus- improved - Continue reglan and neostigmine - Continue to hold scopalamine - Trickle feeds, if continues to have BM/gas can restart home PO meds  Type 2 DM  with hypoglycemia - Continue SSI, should improve with TF  Afib/RVR- on dilt gtt, heparin gtt for now, resume PO when we are sure GI tract working again  [employer] practice:  Diet: trickle feeds Pain/Anxiety/Delirium protocol (if indicated): Fentanyl PRN VAP protocol (if indicated): in place DVT prophylaxis: heparin gtt GI prophylaxis: PPI Glucose control: see above Mobility: quadriplegic Code Status: full Family Communication: updated patient Disposition: ready for LTACH from my standpoint   Erskine Emery MD [facility] 10/11/2019 7:02 AM Personal pager: [phone] If unanswered, please page CCM On-call: [phone]

## 2019-10-11 NOTE — Progress Notes (Signed)
[facility] for Heparin Indication: atrial fibrillation  Allergies  Allergen Reactions  . Adhesive [Tape] Rash    Patient Measurements: Height: 5\' 6"  (167.6 cm) Weight: 75.8 kg (167 lb 1.7 oz) IBW/kg (Calculated) : 63.8 Heparin Dosing Weight: 66.8 kg  Vital Signs: Temp: 97.7 F (36.5 C) (09/28 0843) Temp Source: Oral (09/28 0843) BP: 139/89 (09/28 0700) Pulse Rate: 92 (09/28 0723)  Labs: Recent Labs    10/09/19 1816 10/09/19 1819 10/10/19 0245 10/10/19 1031 10/10/19 1426 10/11/19 0226 10/11/19 0722  HGB   < > 13.0 13.3  --   --   --  13.1  HCT  --  37.8* 37.9*  --   --   --  37.3*  PLT  --  353 343  --   --   --  313  HEPARINUNFRC   < >  --  0.47 0.35  --   --  0.66  CREATININE  --   --  0.41*  --  0.40* <0.30*  --    < > = values in this interval not displayed.    CrCl cannot be calculated (This lab value cannot be used to calculate CrCl because it is not a number: <0.30).  Assessment: Patient is a 7 yom that was being admitted for sepsis. Patient went into new onset afib with RVR. Pharmacy has been asked to dose heparin at this time.   Heparin level today is therapeutic at 0.66 on 1300 units/hr. CBC stable. No s/sx of bleeding or infusion issues. Will continue to monitor  Goal of Therapy:  Heparin level 0.3-0.7 units/ml Monitor platelets by anticoagulation protocol: Yes   Plan:  Continue heparin infusion at 1300 units/hr Monitor daily HL, CBC, s/sx bleeding  Norina Buzzard, PharmD PGY1 Pharmacy Resident 10/11/2019 8:59 AM  Please check AMION for all [place] phone numbers After 10:00 PM, call [place] [phone]

## 2019-10-11 NOTE — Discharge Instructions (Signed)
See discharge summary

## 2019-10-12 DIAGNOSIS — R532 Functional quadriplegia: Secondary | ICD-10-CM

## 2019-10-12 DIAGNOSIS — I482 Chronic atrial fibrillation, unspecified: Secondary | ICD-10-CM

## 2019-10-12 DIAGNOSIS — R6521 Severe sepsis with septic shock: Secondary | ICD-10-CM

## 2019-10-12 DIAGNOSIS — J9621 Acute and chronic respiratory failure with hypoxia: Secondary | ICD-10-CM

## 2019-10-12 LAB — CULTURE, BLOOD (ROUTINE X 2): Culture: NO GROWTH

## 2019-10-13 DIAGNOSIS — R6521 Severe sepsis with septic shock: Secondary | ICD-10-CM

## 2019-10-13 DIAGNOSIS — J9621 Acute and chronic respiratory failure with hypoxia: Secondary | ICD-10-CM

## 2019-10-13 DIAGNOSIS — I482 Chronic atrial fibrillation, unspecified: Secondary | ICD-10-CM

## 2019-10-13 DIAGNOSIS — R532 Functional quadriplegia: Secondary | ICD-10-CM

## 2019-10-14 DIAGNOSIS — I482 Chronic atrial fibrillation, unspecified: Secondary | ICD-10-CM

## 2019-10-14 DIAGNOSIS — J9621 Acute and chronic respiratory failure with hypoxia: Secondary | ICD-10-CM

## 2019-10-14 DIAGNOSIS — R6521 Severe sepsis with septic shock: Secondary | ICD-10-CM

## 2019-10-14 DIAGNOSIS — R532 Functional quadriplegia: Secondary | ICD-10-CM

## 2019-10-14 LAB — CULTURE, BLOOD (ROUTINE X 2)
Culture: NO GROWTH
Culture: NO GROWTH

## 2019-10-15 DIAGNOSIS — J9621 Acute and chronic respiratory failure with hypoxia: Secondary | ICD-10-CM

## 2019-10-15 DIAGNOSIS — I482 Chronic atrial fibrillation, unspecified: Secondary | ICD-10-CM

## 2019-10-15 DIAGNOSIS — R532 Functional quadriplegia: Secondary | ICD-10-CM

## 2019-10-15 DIAGNOSIS — R6521 Severe sepsis with septic shock: Secondary | ICD-10-CM

## 2019-10-16 DIAGNOSIS — R532 Functional quadriplegia: Secondary | ICD-10-CM

## 2019-10-16 DIAGNOSIS — R6521 Severe sepsis with septic shock: Secondary | ICD-10-CM

## 2019-10-16 DIAGNOSIS — J9621 Acute and chronic respiratory failure with hypoxia: Secondary | ICD-10-CM

## 2019-10-16 DIAGNOSIS — I482 Chronic atrial fibrillation, unspecified: Secondary | ICD-10-CM

## 2019-10-24 DIAGNOSIS — J9621 Acute and chronic respiratory failure with hypoxia: Secondary | ICD-10-CM

## 2019-10-24 DIAGNOSIS — R6521 Severe sepsis with septic shock: Secondary | ICD-10-CM

## 2019-10-24 DIAGNOSIS — I482 Chronic atrial fibrillation, unspecified: Secondary | ICD-10-CM

## 2019-10-24 DIAGNOSIS — R532 Functional quadriplegia: Secondary | ICD-10-CM

## 2019-10-25 DIAGNOSIS — R532 Functional quadriplegia: Secondary | ICD-10-CM

## 2019-10-25 DIAGNOSIS — R6521 Severe sepsis with septic shock: Secondary | ICD-10-CM

## 2019-10-25 DIAGNOSIS — J9621 Acute and chronic respiratory failure with hypoxia: Secondary | ICD-10-CM

## 2019-10-25 DIAGNOSIS — I482 Chronic atrial fibrillation, unspecified: Secondary | ICD-10-CM

## 2019-10-26 DIAGNOSIS — I482 Chronic atrial fibrillation, unspecified: Secondary | ICD-10-CM

## 2019-10-26 DIAGNOSIS — R6521 Severe sepsis with septic shock: Secondary | ICD-10-CM

## 2019-10-26 DIAGNOSIS — J9621 Acute and chronic respiratory failure with hypoxia: Secondary | ICD-10-CM

## 2019-10-26 DIAGNOSIS — R532 Functional quadriplegia: Secondary | ICD-10-CM

## 2019-10-27 DIAGNOSIS — J9621 Acute and chronic respiratory failure with hypoxia: Secondary | ICD-10-CM

## 2019-10-27 DIAGNOSIS — R6521 Severe sepsis with septic shock: Secondary | ICD-10-CM

## 2019-10-27 DIAGNOSIS — R532 Functional quadriplegia: Secondary | ICD-10-CM

## 2019-10-27 DIAGNOSIS — I482 Chronic atrial fibrillation, unspecified: Secondary | ICD-10-CM

## 2019-10-28 DIAGNOSIS — I482 Chronic atrial fibrillation, unspecified: Secondary | ICD-10-CM

## 2019-10-28 DIAGNOSIS — J9621 Acute and chronic respiratory failure with hypoxia: Secondary | ICD-10-CM

## 2019-10-28 DIAGNOSIS — R6521 Severe sepsis with septic shock: Secondary | ICD-10-CM

## 2019-10-28 DIAGNOSIS — R532 Functional quadriplegia: Secondary | ICD-10-CM

## 2019-10-29 DIAGNOSIS — R6521 Severe sepsis with septic shock: Secondary | ICD-10-CM

## 2019-10-29 DIAGNOSIS — J9621 Acute and chronic respiratory failure with hypoxia: Secondary | ICD-10-CM

## 2019-10-29 DIAGNOSIS — I482 Chronic atrial fibrillation, unspecified: Secondary | ICD-10-CM

## 2019-10-29 DIAGNOSIS — R532 Functional quadriplegia: Secondary | ICD-10-CM

## 2019-10-30 DIAGNOSIS — R6521 Severe sepsis with septic shock: Secondary | ICD-10-CM

## 2019-10-30 DIAGNOSIS — R532 Functional quadriplegia: Secondary | ICD-10-CM

## 2019-10-30 DIAGNOSIS — J9621 Acute and chronic respiratory failure with hypoxia: Secondary | ICD-10-CM

## 2019-10-30 DIAGNOSIS — I482 Chronic atrial fibrillation, unspecified: Secondary | ICD-10-CM

## 2019-11-05 DIAGNOSIS — J9621 Acute and chronic respiratory failure with hypoxia: Secondary | ICD-10-CM

## 2019-11-05 DIAGNOSIS — I482 Chronic atrial fibrillation, unspecified: Secondary | ICD-10-CM

## 2019-11-05 DIAGNOSIS — R532 Functional quadriplegia: Secondary | ICD-10-CM

## 2019-11-05 DIAGNOSIS — R6521 Severe sepsis with septic shock: Secondary | ICD-10-CM

## 2019-11-06 DIAGNOSIS — I482 Chronic atrial fibrillation, unspecified: Secondary | ICD-10-CM

## 2019-11-06 DIAGNOSIS — J9621 Acute and chronic respiratory failure with hypoxia: Secondary | ICD-10-CM

## 2019-11-06 DIAGNOSIS — R532 Functional quadriplegia: Secondary | ICD-10-CM

## 2019-11-06 DIAGNOSIS — R6521 Severe sepsis with septic shock: Secondary | ICD-10-CM

## 2019-11-07 DIAGNOSIS — R532 Functional quadriplegia: Secondary | ICD-10-CM

## 2019-11-07 DIAGNOSIS — R6521 Severe sepsis with septic shock: Secondary | ICD-10-CM

## 2019-11-07 DIAGNOSIS — I482 Chronic atrial fibrillation, unspecified: Secondary | ICD-10-CM

## 2019-11-07 DIAGNOSIS — J9621 Acute and chronic respiratory failure with hypoxia: Secondary | ICD-10-CM

## 2019-11-08 DIAGNOSIS — J9621 Acute and chronic respiratory failure with hypoxia: Secondary | ICD-10-CM

## 2019-11-08 DIAGNOSIS — R532 Functional quadriplegia: Secondary | ICD-10-CM

## 2019-11-08 DIAGNOSIS — R6521 Severe sepsis with septic shock: Secondary | ICD-10-CM

## 2019-11-08 DIAGNOSIS — I482 Chronic atrial fibrillation, unspecified: Secondary | ICD-10-CM

## 2019-11-09 DIAGNOSIS — J9621 Acute and chronic respiratory failure with hypoxia: Secondary | ICD-10-CM

## 2019-11-09 DIAGNOSIS — R6521 Severe sepsis with septic shock: Secondary | ICD-10-CM

## 2019-11-09 DIAGNOSIS — R532 Functional quadriplegia: Secondary | ICD-10-CM

## 2019-11-09 DIAGNOSIS — I482 Chronic atrial fibrillation, unspecified: Secondary | ICD-10-CM

## 2019-11-10 DIAGNOSIS — J9621 Acute and chronic respiratory failure with hypoxia: Secondary | ICD-10-CM

## 2019-11-10 DIAGNOSIS — R6521 Severe sepsis with septic shock: Secondary | ICD-10-CM

## 2019-11-10 DIAGNOSIS — I482 Chronic atrial fibrillation, unspecified: Secondary | ICD-10-CM

## 2019-11-10 DIAGNOSIS — R532 Functional quadriplegia: Secondary | ICD-10-CM

## 2019-11-11 DIAGNOSIS — J9621 Acute and chronic respiratory failure with hypoxia: Secondary | ICD-10-CM

## 2019-11-11 DIAGNOSIS — I482 Chronic atrial fibrillation, unspecified: Secondary | ICD-10-CM

## 2019-11-11 DIAGNOSIS — R532 Functional quadriplegia: Secondary | ICD-10-CM

## 2019-11-11 DIAGNOSIS — R6521 Severe sepsis with septic shock: Secondary | ICD-10-CM

## 2019-12-19 ENCOUNTER — Other Ambulatory Visit: Payer: Self-pay

## 2019-12-19 ENCOUNTER — Emergency Department (HOSPITAL_COMMUNITY): Payer: Medicare Other

## 2019-12-19 ENCOUNTER — Inpatient Hospital Stay (HOSPITAL_COMMUNITY)
Admission: EM | Admit: 2019-12-19 | Discharge: 2019-12-21 | DRG: 698 | Disposition: A | Discharge status: Other Acute Inpatient Hospital | Payer: Medicare Other | Source: Other Acute Inpatient Hospital | Attending: Internal Medicine | Admitting: Internal Medicine

## 2019-12-19 ENCOUNTER — Encounter (HOSPITAL_COMMUNITY): Payer: Self-pay | Admitting: Emergency Medicine

## 2019-12-19 DIAGNOSIS — G7089 Other specified myoneural disorders: Secondary | ICD-10-CM | POA: Diagnosis present

## 2019-12-19 DIAGNOSIS — Z20822 Contact with and (suspected) exposure to covid-19: Secondary | ICD-10-CM | POA: Diagnosis present

## 2019-12-19 DIAGNOSIS — F419 Anxiety disorder, unspecified: Secondary | ICD-10-CM | POA: Diagnosis present

## 2019-12-19 DIAGNOSIS — Z930 Tracheostomy status: Secondary | ICD-10-CM

## 2019-12-19 DIAGNOSIS — G9341 Metabolic encephalopathy: Secondary | ICD-10-CM | POA: Diagnosis present

## 2019-12-19 DIAGNOSIS — Y846 Urinary catheterization as the cause of abnormal reaction of the patient, or of later complication, without mention of misadventure at the time of the procedure: Secondary | ICD-10-CM | POA: Diagnosis present

## 2019-12-19 DIAGNOSIS — T83511A Infection and inflammatory reaction due to indwelling urethral catheter, initial encounter: Principal | ICD-10-CM | POA: Diagnosis present

## 2019-12-19 DIAGNOSIS — S142XXS Injury of nerve root of cervical spine, sequela: Secondary | ICD-10-CM | POA: Diagnosis not present

## 2019-12-19 DIAGNOSIS — E119 Type 2 diabetes mellitus without complications: Secondary | ICD-10-CM | POA: Diagnosis present

## 2019-12-19 DIAGNOSIS — B9689 Other specified bacterial agents as the cause of diseases classified elsewhere: Secondary | ICD-10-CM | POA: Diagnosis present

## 2019-12-19 DIAGNOSIS — R532 Functional quadriplegia: Secondary | ICD-10-CM | POA: Diagnosis present

## 2019-12-19 DIAGNOSIS — I10 Essential (primary) hypertension: Secondary | ICD-10-CM | POA: Diagnosis present

## 2019-12-19 DIAGNOSIS — X58XXXS Exposure to other specified factors, sequela: Secondary | ICD-10-CM | POA: Diagnosis present

## 2019-12-19 DIAGNOSIS — Z931 Gastrostomy status: Secondary | ICD-10-CM

## 2019-12-19 DIAGNOSIS — D649 Anemia, unspecified: Secondary | ICD-10-CM | POA: Diagnosis present

## 2019-12-19 DIAGNOSIS — R319 Hematuria, unspecified: Secondary | ICD-10-CM | POA: Diagnosis present

## 2019-12-19 DIAGNOSIS — Z933 Colostomy status: Secondary | ICD-10-CM

## 2019-12-19 DIAGNOSIS — Z8614 Personal history of Methicillin resistant Staphylococcus aureus infection: Secondary | ICD-10-CM

## 2019-12-19 DIAGNOSIS — Z87442 Personal history of urinary calculi: Secondary | ICD-10-CM

## 2019-12-19 DIAGNOSIS — M21372 Foot drop, left foot: Secondary | ICD-10-CM | POA: Diagnosis present

## 2019-12-19 DIAGNOSIS — Z9911 Dependence on respirator [ventilator] status: Secondary | ICD-10-CM | POA: Diagnosis not present

## 2019-12-19 DIAGNOSIS — A419 Sepsis, unspecified organism: Secondary | ICD-10-CM | POA: Diagnosis present

## 2019-12-19 DIAGNOSIS — Z794 Long term (current) use of insulin: Secondary | ICD-10-CM

## 2019-12-19 DIAGNOSIS — A4159 Other Gram-negative sepsis: Secondary | ICD-10-CM | POA: Diagnosis present

## 2019-12-19 DIAGNOSIS — R338 Other retention of urine: Secondary | ICD-10-CM | POA: Diagnosis present

## 2019-12-19 DIAGNOSIS — J9611 Chronic respiratory failure with hypoxia: Secondary | ICD-10-CM | POA: Diagnosis present

## 2019-12-19 DIAGNOSIS — Z8619 Personal history of other infectious and parasitic diseases: Secondary | ICD-10-CM

## 2019-12-19 DIAGNOSIS — T83091A Other mechanical complication of indwelling urethral catheter, initial encounter: Secondary | ICD-10-CM | POA: Diagnosis present

## 2019-12-19 DIAGNOSIS — I4891 Unspecified atrial fibrillation: Secondary | ICD-10-CM | POA: Diagnosis present

## 2019-12-19 DIAGNOSIS — Z91048 Other nonmedicinal substance allergy status: Secondary | ICD-10-CM

## 2019-12-19 DIAGNOSIS — E875 Hyperkalemia: Secondary | ICD-10-CM | POA: Diagnosis present

## 2019-12-19 DIAGNOSIS — N390 Urinary tract infection, site not specified: Secondary | ICD-10-CM | POA: Diagnosis present

## 2019-12-19 DIAGNOSIS — M21371 Foot drop, right foot: Secondary | ICD-10-CM | POA: Diagnosis present

## 2019-12-19 DIAGNOSIS — R6521 Severe sepsis with septic shock: Secondary | ICD-10-CM | POA: Diagnosis present

## 2019-12-19 DIAGNOSIS — Z79899 Other long term (current) drug therapy: Secondary | ICD-10-CM

## 2019-12-19 DIAGNOSIS — R6520 Severe sepsis without septic shock: Secondary | ICD-10-CM | POA: Diagnosis not present

## 2019-12-19 DIAGNOSIS — B961 Klebsiella pneumoniae [K. pneumoniae] as the cause of diseases classified elsewhere: Secondary | ICD-10-CM | POA: Diagnosis present

## 2019-12-19 LAB — BASIC METABOLIC PANEL
Anion gap: 11 (ref 5–15)
Anion gap: 10 (ref 5–15)
BUN: 43 mg/dL — ABNORMAL HIGH (ref 8–23)
BUN: 35 mg/dL — ABNORMAL HIGH (ref 8–23)
CO2: 27 mmol/L (ref 22–32)
CO2: 26 mmol/L (ref 22–32)
Calcium: 9 mg/dL (ref 8.9–10.3)
Calcium: 8.8 mg/dL — ABNORMAL LOW (ref 8.9–10.3)
Chloride: 97 mmol/L — ABNORMAL LOW (ref 98–111)
Chloride: 101 mmol/L (ref 98–111)
Creatinine, Ser: 0.53 mg/dL — ABNORMAL LOW (ref 0.61–1.24)
Creatinine, Ser: 0.44 mg/dL — ABNORMAL LOW (ref 0.61–1.24)
GFR, Estimated: >60 mL/min (ref >60)
GFR, Estimated: >60 mL/min (ref >60)
Glucose, Bld: 223 mg/dL — ABNORMAL HIGH (ref 70–99)
Glucose, Bld: 137 mg/dL — ABNORMAL HIGH (ref 70–99)
Potassium: 5.7 mmol/L — ABNORMAL HIGH (ref 3.5–5.1)
Potassium: 4.8 mmol/L (ref 3.5–5.1)
Sodium: 135 mmol/L (ref 135–145)
Sodium: 137 mmol/L (ref 135–145)

## 2019-12-19 LAB — I-STAT ARTERIAL BLOOD GAS, ED
Acid-Base Excess: 5 mmol/L — ABNORMAL HIGH (ref 0.0–2.0)
Bicarbonate: 28.2 mmol/L — ABNORMAL HIGH (ref 20.0–28.0)
Calcium, Ion: 1.21 mmol/L (ref 1.15–1.40)
HCT: 23 % — ABNORMAL LOW (ref 39.0–52.0)
Hemoglobin: 7.8 g/dL — ABNORMAL LOW (ref 13.0–17.0)
O2 Saturation: 93 %
Potassium: 5 mmol/L (ref 3.5–5.1)
Sodium: 137 mmol/L (ref 135–145)
TCO2: 29 mmol/L (ref 22–32)
pCO2 arterial: 36.9 mmHg (ref 32.0–48.0)
pH, Arterial: 7.492 — ABNORMAL HIGH (ref 7.350–7.450)
pO2, Arterial: 62 mmHg — ABNORMAL LOW (ref 83.0–108.0)

## 2019-12-19 LAB — CBC WITH DIFFERENTIAL/PLATELET
Abs Immature Granulocytes: 0.1 10*3/uL — ABNORMAL HIGH (ref 0.00–0.07)
Basophils Absolute: 0 10*3/uL (ref 0.0–0.1)
Basophils Relative: 0 %
Eosinophils Absolute: 0 10*3/uL (ref 0.0–0.5)
Eosinophils Relative: 0 %
HCT: 30.3 % — ABNORMAL LOW (ref 39.0–52.0)
Hemoglobin: 9.9 g/dL — ABNORMAL LOW (ref 13.0–17.0)
Immature Granulocytes: 1 %
Lymphocytes Relative: 2 %
Lymphs Abs: 0.4 10*3/uL — ABNORMAL LOW (ref 0.7–4.0)
MCH: 23 pg — ABNORMAL LOW (ref 26.0–34.0)
MCHC: 32.7 g/dL (ref 30.0–36.0)
MCV: 70.3 fL — ABNORMAL LOW (ref 80.0–100.0)
Monocytes Absolute: 0.8 10*3/uL (ref 0.1–1.0)
Monocytes Relative: 4 %
Neutro Abs: 20.1 10*3/uL — ABNORMAL HIGH (ref 1.7–7.7)
Neutrophils Relative %: 93 %
Platelets: 429 10*3/uL — ABNORMAL HIGH (ref 150–400)
RBC: 4.31 MIL/uL (ref 4.22–5.81)
RDW: 18.3 % — ABNORMAL HIGH (ref 11.5–15.5)
WBC: 21.3 10*3/uL — ABNORMAL HIGH (ref 4.0–10.5)
nRBC: 0.1 % (ref 0.0–0.2)

## 2019-12-19 LAB — URINALYSIS, MICROSCOPIC (REFLEX)
RBC / HPF: >50 RBC/hpf (ref 0–5)
Squamous Epithelial / HPF: NONE SEEN (ref 0–5)

## 2019-12-19 LAB — LACTIC ACID, PLASMA
Lactic Acid, Venous: 2.3 mmol/L (ref 0.5–1.9)
Lactic Acid, Venous: 1.5 mmol/L (ref 0.5–1.9)

## 2019-12-19 LAB — URINALYSIS, ROUTINE W REFLEX MICROSCOPIC: Hgb urine dipstick: NEGATIVE

## 2019-12-19 LAB — PROTIME-INR
INR: 2.3 — ABNORMAL HIGH (ref 0.8–1.2)
Prothrombin Time: 24.5 seconds — ABNORMAL HIGH (ref 11.4–15.2)

## 2019-12-19 LAB — HEMOGLOBIN A1C
Hgb A1c MFr Bld: 6.1 % — ABNORMAL HIGH (ref 4.8–5.6)
Mean Plasma Glucose: 128.37 mg/dL

## 2019-12-19 LAB — CBG MONITORING, ED
Glucose-Capillary: 125 mg/dL — ABNORMAL HIGH (ref 70–99)
Glucose-Capillary: 93 mg/dL (ref 70–99)
Glucose-Capillary: 107 mg/dL — ABNORMAL HIGH (ref 70–99)

## 2019-12-19 LAB — RESP PANEL BY RT-PCR (FLU A&B, COVID) ARPGX2
Influenza A by PCR: NEGATIVE
Influenza B by PCR: NEGATIVE
SARS Coronavirus 2 by RT PCR: NEGATIVE

## 2019-12-19 MED ORDER — SODIUM CHLORIDE 0.9 % IV SOLN
2.0000 g | Freq: Once | INTRAVENOUS | Status: AC
  Administered 2019-12-19: 2 g via INTRAVENOUS
  Filled 2019-12-19: qty 20

## 2019-12-19 MED ORDER — SODIUM CHLORIDE 0.9 % IV SOLN
1.0000 g | Freq: Three times a day (TID) | INTRAVENOUS | Status: DC
  Administered 2019-12-19 – 2019-12-21 (×6): 1 g via INTRAVENOUS
  Filled 2019-12-19 (×10): qty 1

## 2019-12-19 MED ORDER — LACTATED RINGERS IV BOLUS: 1000.0000 mL | Freq: Once | INTRAVENOUS | Status: AC

## 2019-12-19 MED ORDER — CHLORHEXIDINE GLUCONATE 0.12% ORAL RINSE (MEDLINE KIT)
15.0000 mL | Freq: Two times a day (BID) | OROMUCOSAL | Status: DC
  Administered 2019-12-21: 15 mL via OROMUCOSAL

## 2019-12-19 MED ORDER — ACETAMINOPHEN 160 MG/5ML PO SOLN
650.0000 mg | Freq: Four times a day (QID) | ORAL | Status: DC | PRN
  Administered 2019-12-20: 650 mg
  Filled 2019-12-19: qty 20.3

## 2019-12-19 MED ORDER — POLYETHYLENE GLYCOL 3350 17 G PO PACK: 17.0000 g | PACK | Freq: Every day | ORAL | Status: DC | PRN

## 2019-12-19 MED ORDER — ORAL CARE MOUTH RINSE
15.0000 mL | OROMUCOSAL | Status: DC
  Administered 2019-12-21: 15 mL via OROMUCOSAL

## 2019-12-19 MED ORDER — CLONAZEPAM 0.5 MG PO TABS
0.5000 mg | ORAL_TABLET | Freq: Three times a day (TID) | ORAL | Status: DC
  Administered 2019-12-19 – 2019-12-21 (×6): 0.5 mg
  Filled 2019-12-19 (×5): qty 1

## 2019-12-19 MED ORDER — FAMOTIDINE 20 MG PO TABS: 20.0000 mg | ORAL_TABLET | Freq: Every day | ORAL | Status: DC

## 2019-12-19 MED ORDER — PANTOPRAZOLE SODIUM 40 MG IV SOLR
40.0000 mg | Freq: Every day | INTRAVENOUS | Status: DC
  Administered 2019-12-19 – 2019-12-20 (×2): 40 mg via INTRAVENOUS
  Filled 2019-12-19 (×2): qty 40

## 2019-12-19 MED ORDER — LACTATED RINGERS IV SOLN: INTRAVENOUS | Status: AC

## 2019-12-19 MED ORDER — GABAPENTIN 250 MG/5ML PO SOLN: 300.0000 mg | Freq: Two times a day (BID) | ORAL | Status: DC

## 2019-12-19 MED ORDER — IPRATROPIUM-ALBUTEROL 0.5-2.5 (3) MG/3ML IN SOLN: 3.0000 mL | Freq: Four times a day (QID) | RESPIRATORY_TRACT | Status: DC | PRN

## 2019-12-19 MED ORDER — GABAPENTIN 300 MG PO CAPS: 300.0000 mg | ORAL_CAPSULE | Freq: Two times a day (BID) | ORAL | Status: DC

## 2019-12-19 MED ORDER — WARFARIN SODIUM 4 MG PO TABS
4.0000 mg | ORAL_TABLET | Freq: Every day | ORAL | Status: DC
  Administered 2019-12-19: 4 mg via ORAL
  Filled 2019-12-19: qty 1

## 2019-12-19 MED ORDER — LACTATED RINGERS IV BOLUS
1000.0000 mL | Freq: Once | INTRAVENOUS | Status: AC
  Administered 2019-12-19: 1000 mL via INTRAVENOUS

## 2019-12-19 MED ORDER — FENTANYL CITRATE (PF) 100 MCG/2ML IJ SOLN
25.0000 ug | INTRAMUSCULAR | Status: DC | PRN
  Administered 2019-12-20 – 2019-12-21 (×2): 100 ug via INTRAVENOUS
  Filled 2019-12-19 (×3): qty 2

## 2019-12-19 MED ORDER — SERTRALINE HCL 100 MG PO TABS
100.0000 mg | ORAL_TABLET | Freq: Every day | ORAL | Status: DC
  Filled 2019-12-19: qty 1

## 2019-12-19 MED ORDER — ACETAMINOPHEN 160 MG/5ML PO SOLN
650.0000 mg | Freq: Once | ORAL | Status: AC
  Administered 2019-12-19: 650 mg
  Filled 2019-12-19: qty 20.3

## 2019-12-19 MED ORDER — FENTANYL CITRATE (PF) 100 MCG/2ML IJ SOLN
25.0000 ug | INTRAMUSCULAR | Status: DC | PRN
  Administered 2019-12-21: 25 ug via INTRAVENOUS

## 2019-12-19 MED ORDER — SCOPOLAMINE 1 MG/3DAYS TD PT72
1.0000 | MEDICATED_PATCH | TRANSDERMAL | Status: DC
  Administered 2019-12-19: 1.5 mg via TRANSDERMAL
  Filled 2019-12-19: qty 1

## 2019-12-19 MED ORDER — OLOPATADINE HCL 0.1 % OP SOLN
1.0000 [drp] | Freq: Two times a day (BID) | OPHTHALMIC | Status: DC
  Administered 2019-12-19 – 2019-12-21 (×5): 1 [drp] via OPHTHALMIC
  Filled 2019-12-19: qty 5

## 2019-12-19 MED ORDER — WARFARIN - PHARMACIST DOSING INPATIENT: Freq: Every day | Status: DC

## 2019-12-19 MED ORDER — INSULIN ASPART 100 UNIT/ML ~~LOC~~ SOLN
0.0000 [IU] | SUBCUTANEOUS | Status: DC
  Administered 2019-12-19 – 2019-12-21 (×3): 2 [IU] via SUBCUTANEOUS

## 2019-12-19 MED ORDER — AMIODARONE HCL 200 MG PO TABS
200.0000 mg | ORAL_TABLET | Freq: Every day | ORAL | Status: DC
  Administered 2019-12-19 – 2019-12-21 (×3): 200 mg
  Filled 2019-12-19 (×3): qty 1

## 2019-12-19 MED ORDER — CARVEDILOL 3.125 MG PO TABS: 6.2500 mg | ORAL_TABLET | Freq: Two times a day (BID) | ORAL | Status: DC

## 2019-12-19 MED ORDER — LACTATED RINGERS IV SOLN: INTRAVENOUS | Status: DC

## 2019-12-19 MED ORDER — LINEZOLID 600 MG/300ML IV SOLN
600.0000 mg | Freq: Two times a day (BID) | INTRAVENOUS | Status: DC
  Administered 2019-12-19 – 2019-12-20 (×3): 600 mg via INTRAVENOUS
  Filled 2019-12-19 (×4): qty 300

## 2019-12-19 MED ORDER — DOCUSATE SODIUM 50 MG/5ML PO LIQD
100.0000 mg | Freq: Two times a day (BID) | ORAL | Status: DC | PRN
  Filled 2019-12-19: qty 10

## 2019-12-19 MED ORDER — DOCUSATE SODIUM 100 MG PO CAPS: 100.0000 mg | ORAL_CAPSULE | Freq: Two times a day (BID) | ORAL | Status: DC | PRN

## 2019-12-19 MED ORDER — DILTIAZEM HCL 60 MG PO TABS: 60.0000 mg | ORAL_TABLET | Freq: Four times a day (QID) | ORAL | Status: DC

## 2019-12-19 NOTE — ED Notes (Signed)
Mentation greatly improved.  Pt opening eyes, responding to staff.

## 2019-12-19 NOTE — ED Notes (Signed)
Called [facility] to possible downgrade level of card.  MD from [facility] to call back.

## 2019-12-19 NOTE — Sepsis Progress Note (Signed)
Secure chat with bedside nurse and phlebotomy is continuing to try to draw blood cultures and lactic acid.

## 2019-12-19 NOTE — Sepsis Progress Note (Signed)
Sepsis protocol is being followed by [facility]Link. 

## 2019-12-19 NOTE — Progress Notes (Signed)
Midline attempted x 2 unsuccessfully due to the guidewire not being able to advance. Advised RN that central access would be the better option. Luis Orr

## 2019-12-19 NOTE — Progress Notes (Signed)
Pharmacy Antibiotic Note  Luis Orr is a 69 y.o. male admitted on 12/19/2019 with Severe sepsis w/ septic shock 2/2 UT source.  Hx ESBL infections  Pharmacy has been consulted for meropenem dosing.  Scr likely falsely low from paraplegic status  Plan: Meropenem 1g IV q 8 hrs for now Monitor renal function, cultures and clinical course.     Temp (24hrs), Avg:98.9 F (37.2 C), Min:98.3 F (36.8 C), Max:102.3 F (39.1 C)  Recent Labs  Lab 12/19/19 0710 12/19/19 1210  WBC 21.3*  --   CREATININE 0.53*  --   LATICACIDVEN  --  2.3*    CrCl cannot be calculated (Unknown ideal weight.).    Allergies  Allergen Reactions  . Adhesive [Tape] Rash    Antimicrobials this admission: Zyvox 12/6 >  Meropenem 12/6 >   Dose adjustments this admission:  Microbiology results: 12/6 BCx x 2>  12/6 UCx >   Thank you for allowing pharmacy to be a part of this patient's care.  [place] Crane, Roylene Reason, BCCP Clinical Pharmacist  12/19/2019 1:22 PM   [facility] pharmacy phone numbers are listed on [email or web address]

## 2019-12-19 NOTE — Progress Notes (Signed)
ANTICOAGULATION CONSULT NOTE - Initial Consult  Pharmacy Consult for warfarin Indication: atrial fibrillation  Allergies  Allergen Reactions  . Adhesive [Tape] Rash    Patient Measurements:   Heparin Dosing Weight: n/a   Vital Signs: Temp: 98.3 F (36.8 C) (12/06 1215) Temp Source: Rectal (12/06 0658) BP: 91/60 (12/06 1215) Pulse Rate: 69 (12/06 1215)  Labs: Recent Labs    12/19/19 0710 12/19/19 1311  HGB 9.9* 7.8*  HCT 30.3* 23.0*  PLT 429*  --   CREATININE 0.53*  --     CrCl cannot be calculated (Unknown ideal weight.).   Medical History: Past Medical History:  Diagnosis Date  . Anemia   . Anxiety   . Chronic respiratory failure ([place])   . DM2 (diabetes mellitus, type 2) ([place])   . Functional quadriplegia (HCC)   . Hypertension   . Urine retention     Medications:  (Not in a hospital admission)   Assessment: 1 YOM who presented with AMS. Pharmacy consulted to resume PTA warfarin therapy for Afib.   INR on admission is 2.3. PTA warfarin dose is 4 mg daily and the last dose was yesterday. CBC reviewed.   Goal of Therapy:  INR 2-3 Monitor platelets by anticoagulation protocol: Yes   Plan:  -Resume home warfarin regimen of 4 mg daily -Monitor daily PT/ INR -Monitor for s/s of bleeding   Albertina Parr, PharmD., BCPS, BCCCP Clinical Pharmacist Please refer to [facility] for unit-specific pharmacist

## 2019-12-19 NOTE — ED Notes (Signed)
Updated wife

## 2019-12-19 NOTE — ED Provider Notes (Signed)
[name] EMERGENCY DEPARTMENT Provider Note   CSN: [id number] Arrival date & time: 12/19/19  6812     History Chief Complaint  Patient presents with  . Foley Problem    Luis Orr is a 69 y.o. male.  Patient is here from [place] with [facility].  They state that the patient had decreased urine output through his Foley throughout the night so they removed it earlier today and then did not place a new one and still the not making urine so they called [facility] who brought him here for further evaluation.  Patient is a quadriplegic, chronic ventilated, G-tube, ostomy and is not able to give any history.  Nursing received the report that patient pulled the foley out himself and found blood at meatus when they went to put in new one.         Past Medical History:  Diagnosis Date  . Anemia   . Anxiety   . Chronic respiratory failure ([place])   . DM2 (diabetes mellitus, type 2) ([place])   . Functional quadriplegia (H[facility])   . Hypertension   . Urine retention     Patient Active Problem List   Diagnosis Date Noted  . History of ESBL Klebsiella pneumoniae infection 10/07/2019  . History of MRSA infection 10/07/2019  . Adynamic ileus ([place]) 10/07/2019  . Sacral decubitus ulcer 10/07/2019  . AMS (altered mental status) 07/17/2019  . SIRS (systemic inflammatory response syndrome) (H[facility])   . Ventilator dependence ([place])   . Bacteremia due to Staphylococcus 07/07/2019  . Functional quadriplegia ([place]) 07/06/2019  . Chronic respiratory failure ([place]) 07/06/2019  . Sepsis secondary to UTI ([place]) 07/06/2019  . Fecal impaction ([place]) 07/06/2019  . HTN (hypertension) 07/06/2019  . DM2 (diabetes mellitus, type 2) ([place]) 07/06/2019    Past Surgical History:  Procedure Laterality Date  . CYSTOSCOPY WITH STENT PLACEMENT Right 10/08/2019   Procedure: CYSTOSCOPY WITH  STENT PLACEMENT;  Surgeon: Lucas Mallow, MD;  Location: [facility];  Service: Urology;  Laterality: Right;  .  PEG PLACEMENT    . ROTATOR CUFF REPAIR    . TRACHEOSTOMY       No family history on file.  Social History   Tobacco Use  . Smoking status: Never Smoker  . Smokeless tobacco: Never Used  Substance Use Topics  . Alcohol use: Not on file  . Drug use: Not on file    Home Medications Prior to Admission medications   Medication Sig Start Date End Date Taking? Authorizing Provider  acetaminophen (TYLENOL) 160 MG/5ML solution Place 20.3 mLs (650 mg total) into feeding tube every 6 (six) hours as needed for mild pain (or Fever >/= 101). 07/12/19   Allie Bossier, MD  acetaminophen (TYLENOL) 650 MG suppository Place 1 suppository (650 mg total) rectally every 6 (six) hours as needed for mild pain (or Fever >/= 101). 07/12/19   Allie Bossier, MD  amiodarone (NEXTERONE PREMIX) 360-4.14 MG/200ML-% SOLN Inject 30 mg/hr into the vein continuous. Transition to beta blocker or PO amiodarone when enteral absorption confirmed 10/11/19   Candee Furbish, MD  chlorhexidine (PERIDEX) 0.12 % solution 5 mLs by Mouth Rinse route every 12 (twelve) hours.     [provider]  clonazePAM (KLONOPIN) 0.5 MG tablet Place 1 tablet (0.5 mg total) into feeding tube every 8 (eight) hours. 07/12/19   Allie Bossier, MD  dilTIAZem HCl-Dextrose 125-5 MG/125ML-% SOLN infusion Inject 5-15 mg/hr into the vein continuous. Titrate to HR <  110 Hold uptitrating if MAP < 70 10/11/19   Candee Furbish, MD  docusate (COLACE) 50 MG/5ML liquid Place 10 mLs (100 mg total) into feeding tube every 12 (twelve) hours. 07/12/19   Allie Bossier, MD  enoxaparin (LOVENOX) 120 MG/0.8ML injection Inject 0.8 mLs (120 mg total) into the skin daily. Transition to NoAC once enteral access improved 10/12/19   Candee Furbish, MD  famotidine (PEPCID) 20 MG tablet Place 1 tablet (20 mg total) into feeding tube at bedtime. Patient taking differently: Place 20 mg into feeding tube 2 (two) times daily.  07/12/19   Allie Bossier, MD  fentaNYL  (DURAGESIC) 25 MCG/HR Place 1 patch onto the skin every 3 (three) days. 07/14/19   Allie Bossier, MD  gabapentin (NEURONTIN) 250 MG/5ML solution Place 6 mLs (300 mg total) into feeding tube 2 (two) times daily. 07/12/19   Allie Bossier, MD  HYDROcodone-acetaminophen (NORCO) 7.5-325 MG tablet Place 1 tablet into feeding tube 2 (two) times daily. 07/12/19   Allie Bossier, MD  insulin regular (NOVOLIN R) 100 units/mL injection Inject 2-10 Units into the skin See admin instructions. Inject 2-10 units into the skin every 6 hours, per sliding scale: BGL 151-200 = 2 units, 201-250= 4 units, 251-300= 6 units, 301-350= 8 units, 351-400 = 10 units and call MD if <60 or >400    [provider]  ipratropium-albuterol (DUONEB) 0.5-2.5 (3) MG/3ML SOLN Take 3 mLs by nebulization every 4 (four) hours as needed (for shortness of breath).     [provider]  lidocaine (LIDODERM) 5 % Place 1 patch onto the skin daily. Remove & Discard patch within 12 hours or as directed by MD    [provider]  lisinopril (ZESTRIL) 5 MG tablet Take 1 tablet (5 mg total) by mouth daily. Patient taking differently: Place 5 mg into feeding tube daily.  07/13/19   Allie Bossier, MD  LORazepam (ATIVAN) 2 MG/ML injection Inject 0.25 mLs (0.5 mg total) into the vein every 4 (four) hours as needed for anxiety. Replace with clonazepam once enteral absorption confirmed. 10/11/19   Candee Furbish, MD  meropenem 2 g in sodium chloride 0.9 % 100 mL Inject 2 g into the vein every 8 (eight) hours. 10/11/19   Candee Furbish, MD  metoCLOPramide (REGLAN) 5 MG/ML injection Inject 2 mLs (10 mg total) into the vein every 6 (six) hours. 10/11/19   Candee Furbish, MD  Multiple Vitamin (MULTIVITAMIN WITH MINERALS) TABS tablet Take 1 tablet by mouth daily. Patient taking differently: Place 1 tablet into feeding tube daily.  07/13/19   Allie Bossier, MD  Nutritional Supplements (FEEDING SUPPLEMENT, OSMOLITE 1.5 CAL,) LIQD Place  1,000 mLs into feeding tube continuous. Patient taking differently: Place 20-40 mL/hr into feeding tube See admin instructions. Beginning on 10/06/2019, start at 20 ml's/hr and increase by 10 ml's/hr per day- goal is 40 ml's/hr 07/12/19   Allie Bossier, MD  Olopatadine HCl 0.2 % SOLN Place 1 drop into both eyes daily.    [provider]  ondansetron (ZOFRAN) 4 MG/2ML SOLN injection Inject 2 mLs (4 mg total) into the vein every 6 (six) hours as needed for nausea. 10/11/19   Candee Furbish, MD  ondansetron (ZOFRAN-ODT) 4 MG disintegrating tablet 4 mg See admin instructions. Dissolve 4 mg into g-tube every 6 hours as needed for nausea    [provider]  pantoprazole (PROTONIX) 40 MG injection Inject 40 mg into the  vein at bedtime. 10/11/19   Candee Furbish, MD  polyethylene glycol (MIRALAX / GLYCOLAX) 17 g packet Take 17 g by mouth daily. Patient taking differently: Place 17 g into feeding tube daily.  07/18/19   Candee Furbish, MD  potassium chloride 20 MEQ/15ML (10%) SOLN Place 20 mEq into feeding tube daily.    [provider]  sertraline (ZOLOFT) 100 MG tablet Place 1 tablet (100 mg total) into feeding tube at bedtime. 07/12/19   Allie Bossier, MD  Sodium Chloride Flush (NORMAL SALINE FLUSH IV) Inject 10 mLs into the vein See admin instructions. 10 ml's as needed before and after medications    [provider]    Allergies    Adhesive [tape]  Review of Systems   Review of Systems  Unable to perform ROS: Patient unresponsive    Physical Exam Updated Vital Signs There were no vitals taken for this visit.  Physical Exam Vitals and nursing note reviewed.  HENT:     Head: Normocephalic.     Nose: No rhinorrhea.     Mouth/Throat:     Mouth: Mucous membranes are dry.  Eyes:     Pupils: Pupils are equal, round, and reactive to light.  Cardiovascular:     Rate and Rhythm: Normal rate.  Pulmonary:     Effort: Pulmonary effort is normal.     Comments:  Trach on vent Abdominal:     Tenderness: There is no abdominal tenderness.  Musculoskeletal:        General: No deformity.  Skin:    General: Skin is warm and dry.  Neurological:     Mental Status: He is alert.     Comments: Quadriplegic, not responsive     ED Results / Procedures / Treatments   Labs (all labs ordered are listed, but only abnormal results are displayed) Labs Reviewed  URINE CULTURE  CBC WITH DIFFERENTIAL/PLATELET  BASIC METABOLIC PANEL  URINALYSIS, ROUTINE W REFLEX MICROSCOPIC    EKG None  Radiology No results found.  Procedures .Critical Care Performed by: Merrily Pew, MD Authorized by: Merrily Pew, MD   Critical care provider statement:    Critical care time (minutes):  45   Critical care was necessary to treat or prevent imminent or life-threatening deterioration of the following conditions:  Sepsis and circulatory failure   Critical care was time spent personally by me on the following activities:  Discussions with consultants, evaluation of patient's response to treatment, examination of patient, ordering and performing treatments and interventions, ordering and review of laboratory studies, ordering and review of radiographic studies, pulse oximetry, re-evaluation of patient's condition, obtaining history from patient or surrogate and review of old charts   (including critical care time)  Medications Ordered in ED Medications - No data to display  ED Course  I have reviewed the triage vital signs and the nursing notes.  Pertinent labs & imaging results that were available during my care of the patient were reviewed by me and considered in my medical decision making (see chart for details).  Clinical Course as of Dec 19 857  Mon Dec 19, 2019  1014 Labs reviewed.  Covid test is negative.  Electrolyte panel shows mild hyperkalemia and elevated BUN    [JK]  1014 Patient does have leukocytosis   [JK]  1014 Urinalysis is consistent with  urinary tract infection.   [JK]    Clinical Course User Index [JK] Dorie Rank, MD   MDM Rules/Calculators/A&P  Unclear reasons why the patient's foley is no longer in place. However maybe slihgtly encephalopathic from fever? Will add on lactic, cxr, blood cultures, urine urine cultures.  Patient now with a blood pressure of 81/61.  This in concert with his fever and his urine looks like it could likely be infected will call code sepsis.  Cultures and all other labs already ordered.  We will add on an extra liter of fluids along with 250 cc that should give his 30 cc/kg.  Final Clinical Impression(s) / ED Diagnoses Final diagnoses:  Sepsis, due to unspecified organism, unspecified whether acute organ dysfunction present [facility])    Rx / DC Orders ED Discharge Orders    None       Manuelito Poage, Corene Cornea, MD 12/20/19 0900

## 2019-12-19 NOTE — ED Notes (Signed)
Unable to obtain blood cultures.  MD aware and states to give antibiotics.

## 2019-12-19 NOTE — ED Notes (Signed)
IV team at bedside 

## 2019-12-19 NOTE — Progress Notes (Signed)
Received patient from [facility]. Placed on previous vent settings from facility. PRVC 500-16-50%+5.  #6LT Proximal trach secure.

## 2019-12-19 NOTE — H&P (Addendum)
NAME:  Luis Orr, MRN:  [id number], DOB:  1950-11-05, LOS: 0 ADMISSION DATE:  12/19/2019, CONSULTATION DATE: 12/6 REFERRING MD:  Septic shock, CHIEF COMPLAINT:  Tomi Bamberger   Brief History   69 year old male, ventilator/trach/PEG/Foley dependent.  History of C3-C5 myelopathy with functional quadriplegia.  Admitted 12/6 with altered mental status, fever, hypotension, and hematuria working diagnosis urinary tract infection with septic shock from urinary source  History of present illness   69 year old male patient who resides at [place] home for chronic ventilator/tracheostomy dependence in the context of functional quadriplegia from C3-C5 myelopathy.  He is PEG, trach, vent dependent.  Presenting to the emergency room from [place] skilled nursing for altered mental status, hematuria, and decreased urine output.  On arrival to emergency room patient found to be febrile with temperature of 102.3 Fahrenheit, hypotensive with systolic blood pressure as low as the 80s with mild hyperkalemia, leukocytosis, and hematuria.  Broad-spectrum antibiotics were initiated, initial cultures difficult to obtain due to limited IV access, he was administered 4 units of crystalloid with successful improvement of blood pressure.  Of note apparently was in usual state, there is nursing home report that the patient had pulled his catheter out.  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 urinary tract infections Prior MRSA bacteremia June 2021 Last hospitalization: September 24 through October 11, 2019 for urinary tract infection in the setting of obstructive uropathy with right ureteral calculus for  which he underwent double-J ureteral stent Chronic Foley, Due to urinary retention Diverting colostomy PEG tube  Significant Hospital Events   12/6 admitted  Consults:    Procedures:    Significant Diagnostic Tests:  Renal ultrasound 12/6  Micro Data:  Urine culture 12/6 Blood culture 12/6   Antimicrobials:  Meropenem, has history of ESBL producing organism 12/6>>. zyvox 12/6>>  Interim history/subjective:  We will follow some commands with hands and tongue.  Seems to be improving with volume resuscitation  Objective   Blood pressure (Abnormal) 81/60, pulse 78, temperature 98.4 F (36.9 C), resp. rate 16, SpO2 97 %.    Vent Mode: PRVC FiO2 (%):  [50 %] 50 % Set Rate:  [16 bmp] 16 bmp Vt Set:  [500 mL] 500 mL PEEP:  [5 cmH20] 5 cmH20 Plateau Pressure:  [13 cmH20] 13 cmH20   Intake/Output Summary (Last 24 hours) at 12/19/2019 1109 Last data filed at 12/19/2019 0858 Gross per 24 hour  Intake 1100 ml  Output no documentation  Net 1100 ml   There were no vitals filed for this visit.  Examination: General: Chronically ill-appearing 69 year old male currently ventilator and tracheostomy dependent he is in no acute distress currently HENT: Tracheostomy dependent mucous membranes moist sclera nonicteric Lungs: Clear, equal chest rise bilaterally Cardiovascular: Regular rate and rhythm Abdomen: PEG tube unremarkable, colostomy unremarkable, bowel sounds present Extremities: Bilateral foot drop, trace lower extremity edema, brisk cap refill, strong pulses.  Neuro: opens eyes to request. Can open eyes. Will follow commands. Can wiggle hands. Can stick out tongue.  GU: foley cath in place. Mild hematuria   Resolved Hospital Problem list    Assessment & Plan:   Severe sepsis w/ septic shock 2/2 UT source.  -responded to volume resuscitation -abx administered.  -does have h/o  ESBL organisms Plan Cont IVFs Send cultures Broad spec abx (will place on meropenem given  h/o ESBL producing orgs); will also start Zyvox w/ low threshold to stop once we see no staph  Hold coreg and cardiazem for now  Acute Metabolic Encephalopathy 2/2 sepsis Plan Supportive care Holding sedating meds Holding ssri while on zyvox but will need to be added back   Chronic Respiratory Failure/trach and vent dependence 2/2 neuro-muscular injury/ C3-C5 myelopathy Plan Cont full vent support VAP bundle  PAD protocol  Am cxr   H/o afib Plan Cont rate control and AC  Hyperkalemia  Plan  Repeat later this afternoon  Leukocytosis Plan Trend cbc  Chronic anemia  Plan Trend cbc Transfuse for hgb < 7  DM type II Plan ssi     Best practice (evaluated daily)   Diet: tubefeeds Pain/Anxiety/Delirium protocol (if indicated): PAD VAP protocol (if indicated): 12/6 DVT prophylaxis: scd-->coumadin  GI prophylaxis: PPI Glucose control: ssi Mobility: BR last date of multidisciplinary goals of care discussion still pending  Family and staff present (pending) Summary of discussion pending  Follow up goals of care discussion due still due 12/13 Code Status: full code Disposition: ICU  Labs   CBC: Recent Labs  Lab 12/19/19 0710  WBC 21.3*  NEUTROABS 20.1*  HGB 9.9*  HCT 30.3*  MCV 70.3*  PLT 429*    Basic Metabolic Panel: Recent Labs  Lab 12/19/19 0710  NA 135  K 5.7*  CL 97*  CO2 27  GLUCOSE 223*  BUN 43*  CREATININE 0.53*  CALCIUM 9.0   GFR: CrCl cannot be calculated (Unknown ideal weight.). Recent Labs  Lab 12/19/19 0710  WBC 21.3*    Liver Function Tests: No results for input(s): AST, ALT, ALKPHOS, BILITOT, PROT, ALBUMIN in the last 168 hours. No results for input(s): LIPASE, AMYLASE in the last 168 hours. No results for input(s): AMMONIA in the last 168 hours.  ABG    Component Value Date/Time   PHART 7.432 07/07/2019 0104   PCO2ART 39.4 07/07/2019 0104   PO2ART 94 07/07/2019 0104   HCO3 26.2 07/07/2019 0104   TCO2 27  07/07/2019 0104   O2SAT 97.0 07/07/2019 0104     Coagulation Profile: No results for input(s): INR, PROTIME in the last 168 hours.  Cardiac Enzymes: No results for input(s): CKTOTAL, CKMB, CKMBINDEX, TROPONINI in the last 168 hours.  HbA1C: Hgb A1c MFr Bld  Date/Time Value Ref Range Status  10/08/2019 02:57 AM 5.8 (H) 4.8 - 5.6 % Final    Comment:    (NOTE) Pre diabetes:          5.7%-6.4%  Diabetes:              >6.4%  Glycemic control for   <7.0% adults with diabetes   07/07/2019 04:00 AM 7.0 (H) 4.8 - 5.6 % Final    Comment:    (NOTE) Pre diabetes:          5.7%-6.4%  Diabetes:              >6.4%  Glycemic control for   <7.0% adults with diabetes     CBG: No results for input(s): GLUCAP in the last 168 hours.  Review of Systems:   ROS unable   Past Medical History  He,  has a past medical history of Anemia, Anxiety, Chronic respiratory failure ([place]), DM2 (diabetes mellitus, type 2) ([place]), Functional quadriplegia ([place]), Hypertension, and Urine retention.   Surgical History    Past Surgical  History:  Procedure Laterality Date  . CYSTOSCOPY WITH STENT PLACEMENT Right 10/08/2019   Procedure: CYSTOSCOPY WITH  STENT PLACEMENT;  Surgeon: Lucas Mallow, MD;  Location: [facility];  Service: Urology;  Laterality: Right;  . PEG PLACEMENT    . ROTATOR CUFF REPAIR    . TRACHEOSTOMY       Social History   reports that he has never smoked. He has never used smokeless tobacco.   Family History   His family history is not on file.   Allergies Allergies  Allergen Reactions  . Adhesive [Tape] Rash     Home Medications  Prior to Admission medications   Medication Sig Start Date End Date Taking? Authorizing Provider  amiodarone (PACERONE) 200 MG tablet Place 200 mg into feeding tube daily.   Yes [provider]  carvedilol (COREG) 6.25 MG tablet Place 6.25 mg into feeding tube every 12 (twelve) hours.   Yes [provider]  diltiazem (CARDIZEM)  60 MG tablet Place 60 mg into feeding tube every 6 (six) hours.   Yes [provider]  fentaNYL (DURAGESIC) 25 MCG/HR Place 1 patch onto the skin every 3 (three) days. 07/14/19  Yes Allie Bossier, MD  insulin glargine (LANTUS) 100 UNIT/ML injection Inject 30 Units into the skin daily.   Yes [provider]  Multiple Vitamin (MULTIVITAMIN WITH MINERALS) TABS tablet Take 1 tablet by mouth daily. Patient taking differently: Place 1 tablet into feeding tube daily.  07/13/19  Yes Allie Bossier, MD  Olopatadine HCl 0.2 % SOLN Place 1 drop into both eyes daily.   Yes [provider]  scopolamine (TRANSDERM-SCOP) 1 MG/3DAYS Place 1 patch onto the skin every 3 (three) days.   Yes [provider]  acetaminophen (TYLENOL) 160 MG/5ML solution Place 20.3 mLs (650 mg total) into feeding tube every 6 (six) hours as needed for mild pain (or Fever >/= 101). 07/12/19   Allie Bossier, MD  acetaminophen (TYLENOL) 650 MG suppository Place 1 suppository (650 mg total) rectally every 6 (six) hours as needed for mild pain (or Fever >/= 101). 07/12/19   Allie Bossier, MD  amiodarone (NEXTERONE PREMIX) 360-4.14 MG/200ML-% SOLN Inject 30 mg/hr into the vein continuous. Transition to beta blocker or PO amiodarone when enteral absorption confirmed 10/11/19   Candee Furbish, MD  chlorhexidine (PERIDEX) 0.12 % solution 5 mLs by Mouth Rinse route every 12 (twelve) hours.     [provider]  clonazePAM (KLONOPIN) 0.5 MG tablet Place 1 tablet (0.5 mg total) into feeding tube every 8 (eight) hours. 07/12/19   Allie Bossier, MD  dilTIAZem HCl-Dextrose 125-5 MG/125ML-% SOLN infusion Inject 5-15 mg/hr into the vein continuous. Titrate to HR < 110 Hold uptitrating if MAP < 70 10/11/19   Candee Furbish, MD  docusate (COLACE) 50 MG/5ML liquid Place 10 mLs (100 mg total) into feeding tube every 12 (twelve) hours. 07/12/19   Allie Bossier, MD  enoxaparin (LOVENOX) 120 MG/0.8ML injection Inject  0.8 mLs (120 mg total) into the skin daily. Transition to NoAC once enteral access improved 10/12/19   Candee Furbish, MD  famotidine (PEPCID) 20 MG tablet Place 1 tablet (20 mg total) into feeding tube at bedtime. Patient taking differently: Place 20 mg into feeding tube 2 (two) times daily.  07/12/19   Allie Bossier, MD  gabapentin (NEURONTIN) 250 MG/5ML solution Place 6 mLs (300 mg total) into feeding tube 2 (two) times daily. 07/12/19   Sherral Hammers,  Geraldo Docker, MD  HYDROcodone-acetaminophen (NORCO) 7.5-325 MG tablet Place 1 tablet into feeding tube 2 (two) times daily. 07/12/19   Allie Bossier, MD  insulin regular (NOVOLIN R) 100 units/mL injection Inject 2-10 Units into the skin See admin instructions. Inject 2-10 units into the skin every 6 hours, per sliding scale: BGL 151-200 = 2 units, 201-250= 4 units, 251-300= 6 units, 301-350= 8 units, 351-400 = 10 units and call MD if <60 or >400    [provider]  ipratropium-albuterol (DUONEB) 0.5-2.5 (3) MG/3ML SOLN Take 3 mLs by nebulization every 4 (four) hours as needed (for shortness of breath).     [provider]  lidocaine (LIDODERM) 5 % Place 1 patch onto the skin daily. Remove & Discard patch within 12 hours or as directed by MD    [provider]  lisinopril (ZESTRIL) 5 MG tablet Take 1 tablet (5 mg total) by mouth daily. Patient taking differently: Place 5 mg into feeding tube daily.  07/13/19   Allie Bossier, MD  LORazepam (ATIVAN) 2 MG/ML injection Inject 0.25 mLs (0.5 mg total) into the vein every 4 (four) hours as needed for anxiety. Replace with clonazepam once enteral absorption confirmed. 10/11/19   Candee Furbish, MD  meropenem 2 g in sodium chloride 0.9 % 100 mL Inject 2 g into the vein every 8 (eight) hours. 10/11/19   Candee Furbish, MD  metoCLOPramide (REGLAN) 5 MG/ML injection Inject 2 mLs (10 mg total) into the vein every 6 (six) hours. 10/11/19   Candee Furbish, MD  Nutritional Supplements (FEEDING  SUPPLEMENT, OSMOLITE 1.5 CAL,) LIQD Place 1,000 mLs into feeding tube continuous. Patient taking differently: Place 20-40 mL/hr into feeding tube See admin instructions. Beginning on 10/06/2019, start at 20 ml's/hr and increase by 10 ml's/hr per day- goal is 40 ml's/hr 07/12/19   Allie Bossier, MD  ondansetron [facility]) 4 MG/2ML SOLN injection Inject 2 mLs (4 mg total) into the vein every 6 (six) hours as needed for nausea. 10/11/19   Candee Furbish, MD  ondansetron (ZOFRAN-ODT) 4 MG disintegrating tablet 4 mg See admin instructions. Dissolve 4 mg into g-tube every 6 hours as needed for nausea    [provider]  pantoprazole (PROTONIX) 40 MG injection Inject 40 mg into the vein at bedtime. 10/11/19   Candee Furbish, MD  polyethylene glycol (MIRALAX / GLYCOLAX) 17 g packet Take 17 g by mouth daily. Patient taking differently: Place 17 g into feeding tube daily.  07/18/19   Candee Furbish, MD  potassium chloride 20 MEQ/15ML (10%) SOLN Place 20 mEq into feeding tube daily.    [provider]  sertraline (ZOLOFT) 100 MG tablet Place 1 tablet (100 mg total) into feeding tube at bedtime. 07/12/19   Allie Bossier, MD  Sodium Chloride Flush (NORMAL SALINE FLUSH IV) Inject 10 mLs into the vein See admin instructions. 10 ml's as needed before and after medications    [provider]     Critical care time: 43 min    Erick Colace ACNP-BC [place] Pager # [phone] OR # [phone] if no answer  Pulmonary critical care attending:  This is a 69 year old male, ventilator dependent, trach, PEG, Foley dependent following a C3-5 myelopathy, functional quadriplegic admitted from [facility].  Admitted for hypotension as well as hematuria concern for urinary tract infection.  Patient has a history of ESBL organisms in the past.  Patient was hypotensive on vent and ICU  was consulted for recommendations and management.  BP 108/69   Pulse 66   Temp (!) 97.5  F (36.4 C)   Resp 16   SpO2 100%   General: Chronically debilitated male tracheostomy tube dependent on mechanical vent. 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 fib Acute metabolic encephalopathy secondary to sepsis Hyperkalemia Type 2 diabetes  Plan: Continue volume resuscitation Meropenem plus linezolid Can de-escalate once we have cultures back. Adult mechanical vent support Routine trach care Sedation with PAD protocol as needed.  This patient is critically ill with multiple organ system failure; which, requires frequent high complexity decision making, assessment, support, evaluation, and titration of therapies. This was completed through the application of advanced monitoring technologies and extensive interpretation of multiple databases. During this encounter critical care time was devoted to patient care services described in this note for 33 minutes.  Garner Nash, DO [facility] 12/19/2019 6:06 PM

## 2019-12-19 NOTE — ED Notes (Signed)
Fentanyl patch on L chest per NP.

## 2019-12-19 NOTE — ED Notes (Addendum)
Note place on wrong pt.

## 2019-12-19 NOTE — ED Triage Notes (Signed)
Patient from [place], patient is unresponsive, which is his baseline.  [facility] was called to transport to ED for decreased urine output.  Patient has chronic renal failure. Patient is a chronic vent patient.  [facility] staff took chronic indwelling foley out due to possible blood in foley.

## 2019-12-19 NOTE — ED Provider Notes (Signed)
Patient initially seen by Dr. Dolly Rias.  Please see his note.  Patient remains hypotensive.  Patient has received 1 L of fluid.  Additional fluid boluses are pending.  Empiric antibiotics already ordered.  Patient being treated for sepsis.  Physical Exam  BP (!) 81/61   Pulse 82   Temp (!) 102.3 F (39.1 C) (Rectal)   Resp 16   SpO2 100%   Physical Exam Vitals reviewed.  Patient hypotensive Heart regular rate Lungs no tachypnea Skin warm well perfused ED Course/Procedures   Clinical Course as of Dec 18 1013  [facility] Dec 19, 2019  1014 Labs reviewed.  Covid test is negative.  Electrolyte panel shows mild hyperkalemia and elevated BUN    [JK]  1014 Patient does have leukocytosis   [JK]  1014 Urinalysis is consistent with urinary tract infection.   [JK]    Clinical Course User Index [JK] Dorie Rank, MD    .Critical Care Performed by: Dorie Rank, MD Authorized by: Dorie Rank, MD   Critical care provider statement:    Critical care time (minutes):  45   Critical care was time spent personally by me on the following activities:  Discussions with consultants, evaluation of patient's response to treatment, examination of patient, ordering and performing treatments and interventions, ordering and review of laboratory studies, ordering and review of radiographic studies, pulse oximetry, re-evaluation of patient's condition, obtaining history from patient or surrogate and review of old charts   Clinical Course as of Dec 18 1013  Mon Dec 19, 2019  1014 Labs reviewed.  Covid test is negative.  Electrolyte panel shows mild hyperkalemia and elevated BUN    [JK]  1014 Patient does have leukocytosis   [JK]  1014 Urinalysis is consistent with urinary tract infection.   [JK]    Clinical Course User Index [JK] Dorie Rank, MD    MDM  Patient initially presented with Foley catheter dysfunction.  In the ED the patient was noted to have a temperature of 102.3.  Patient also ended up becoming  hypotensive.  Presentation concerning for sepsis.  Patient was started on IV fluid and empiric antibiotics.  Patient does have persistent leukocytosis and hypotension although somewhat improving slightly.  Will consult with critical care service for admission and further treatment of sepsis.     Dorie Rank, MD 12/19/19 1016

## 2019-12-20 DIAGNOSIS — R6520 Severe sepsis without septic shock: Secondary | ICD-10-CM | POA: Diagnosis not present

## 2019-12-20 DIAGNOSIS — A419 Sepsis, unspecified organism: Secondary | ICD-10-CM | POA: Diagnosis not present

## 2019-12-20 LAB — CBC
HCT: 27.7 % — ABNORMAL LOW (ref 39.0–52.0)
Hemoglobin: 8.8 g/dL — ABNORMAL LOW (ref 13.0–17.0)
MCH: 22.4 pg — ABNORMAL LOW (ref 26.0–34.0)
MCHC: 31.8 g/dL (ref 30.0–36.0)
MCV: 70.5 fL — ABNORMAL LOW (ref 80.0–100.0)
Platelets: 358 10*3/uL (ref 150–400)
RBC: 3.93 MIL/uL — ABNORMAL LOW (ref 4.22–5.81)
RDW: 18.2 % — ABNORMAL HIGH (ref 11.5–15.5)
WBC: 18.1 10*3/uL — ABNORMAL HIGH (ref 4.0–10.5)
nRBC: 0 % (ref 0.0–0.2)

## 2019-12-20 LAB — PROTIME-INR
INR: 2.1 — ABNORMAL HIGH (ref 0.8–1.2)
Prothrombin Time: 23.2 seconds — ABNORMAL HIGH (ref 11.4–15.2)

## 2019-12-20 LAB — BASIC METABOLIC PANEL
Anion gap: 11 (ref 5–15)
BUN: 25 mg/dL — ABNORMAL HIGH (ref 8–23)
CO2: 25 mmol/L (ref 22–32)
Calcium: 8.9 mg/dL (ref 8.9–10.3)
Chloride: 101 mmol/L (ref 98–111)
Creatinine, Ser: 0.39 mg/dL — ABNORMAL LOW (ref 0.61–1.24)
GFR, Estimated: >60 mL/min (ref >60)
Glucose, Bld: 116 mg/dL — ABNORMAL HIGH (ref 70–99)
Potassium: 4.2 mmol/L (ref 3.5–5.1)
Sodium: 137 mmol/L (ref 135–145)

## 2019-12-20 LAB — CBG MONITORING, ED
Glucose-Capillary: 101 mg/dL — ABNORMAL HIGH (ref 70–99)
Glucose-Capillary: 109 mg/dL — ABNORMAL HIGH (ref 70–99)
Glucose-Capillary: 114 mg/dL — ABNORMAL HIGH (ref 70–99)
Glucose-Capillary: 88 mg/dL (ref 70–99)
Glucose-Capillary: 91 mg/dL (ref 70–99)
Glucose-Capillary: 94 mg/dL (ref 70–99)
Glucose-Capillary: 95 mg/dL (ref 70–99)

## 2019-12-20 LAB — PHOSPHORUS
Phosphorus: 3.2 mg/dL (ref 2.5–4.6)
Phosphorus: 2.8 mg/dL (ref 2.5–4.6)

## 2019-12-20 LAB — MAGNESIUM
Magnesium: 1.8 mg/dL (ref 1.7–2.4)
Magnesium: 1.9 mg/dL (ref 1.7–2.4)

## 2019-12-20 MED ORDER — GABAPENTIN 300 MG PO CAPS
300.0000 mg | ORAL_CAPSULE | Freq: Two times a day (BID) | ORAL | Status: DC
  Administered 2019-12-20 – 2019-12-21 (×2): 300 mg via ORAL
  Filled 2019-12-20 (×2): qty 1

## 2019-12-20 MED ORDER — CARVEDILOL 12.5 MG PO TABS
6.2500 mg | ORAL_TABLET | Freq: Two times a day (BID) | ORAL | Status: DC
  Administered 2019-12-20 – 2019-12-21 (×2): 6.25 mg via ORAL
  Filled 2019-12-20: qty 2
  Filled 2019-12-20: qty 1

## 2019-12-20 MED ORDER — VITAL HIGH PROTEIN PO LIQD
1000.0000 mL | ORAL | Status: DC
  Administered 2019-12-20: 1000 mL
  Filled 2019-12-20: qty 1000

## 2019-12-20 MED ORDER — FENTANYL 25 MCG/HR TD PT72
1.0000 | MEDICATED_PATCH | TRANSDERMAL | Status: DC
  Administered 2019-12-20: 1 via TRANSDERMAL
  Filled 2019-12-20: qty 1

## 2019-12-20 MED ORDER — WARFARIN SODIUM 4 MG PO TABS
4.0000 mg | ORAL_TABLET | Freq: Every day | ORAL | Status: DC
  Administered 2019-12-20: 4 mg
  Filled 2019-12-20 (×2): qty 1

## 2019-12-20 MED ORDER — SODIUM CHLORIDE 0.9 % IV SOLN: 1.0000 g | Freq: Three times a day (TID) | INTRAVENOUS | Status: AC

## 2019-12-20 MED ORDER — PROSOURCE TF PO LIQD
45.0000 mL | Freq: Two times a day (BID) | ORAL | Status: DC
  Administered 2019-12-20 – 2019-12-21 (×2): 45 mL
  Filled 2019-12-20 (×2): qty 45

## 2019-12-20 MED ORDER — MAGNESIUM SULFATE 2 GM/50ML IV SOLN
2.0000 g | Freq: Once | INTRAVENOUS | Status: AC
  Administered 2019-12-20: 2 g via INTRAVENOUS
  Filled 2019-12-20: qty 50

## 2019-12-20 MED ORDER — HYDROCODONE-ACETAMINOPHEN 7.5-325 MG PO TABS: 1.0000 | ORAL_TABLET | Freq: Four times a day (QID) | ORAL | Status: DC | PRN

## 2019-12-20 NOTE — ED Notes (Addendum)
Extra tubes sent down to lab if needed. New colostomy bag placed

## 2019-12-20 NOTE — Progress Notes (Signed)
NAME:  Luis Orr, MRN:  [id number], DOB:  [birth year], LOS: 1 ADMISSION DATE:  12/19/2019, CONSULTATION DATE: 12/6 REFERRING MD:  Septic shock, CHIEF COMPLAINT:  Luis Orr   Brief History   69 year old male, ventilator/trach/PEG/Foley dependent.  History of C3-C5 myelopathy with functional quadriplegia.  Admitted 12/6 with altered mental status, fever, hypotension, and hematuria working diagnosis urinary tract infection with septic shock from urinary source  History of present illness   69 year old male patient who resides at [place] home for chronic ventilator/tracheostomy dependence in the context of functional quadriplegia from C3-C5 myelopathy.  He is PEG, trach, vent dependent.  Presenting to the emergency room from [place] skilled nursing for altered mental status, hematuria, and decreased urine output.  On arrival to emergency room patient found to be febrile with temperature of 102.3 Fahrenheit, hypotensive with systolic blood pressure as low as the 80s with mild hyperkalemia, leukocytosis, and hematuria.  Broad-spectrum antibiotics were initiated, initial cultures difficult to obtain due to limited IV access, he was administered 4 units of crystalloid with successful improvement of blood pressure.  Of note apparently was in usual state, there is nursing home report that the patient had pulled his catheter out.  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 urinary tract infections Prior MRSA bacteremia June 2021 Last hospitalization: September 24 through October 11, 2019 for urinary tract infection in the setting of obstructive uropathy with right ureteral calculus for  which he underwent double-J ureteral stent Chronic Foley, Due to urinary retention Diverting colostomy PEG tube  Significant Hospital Events   12/6 admitted  Consults:    Procedures:    Significant Diagnostic Tests:  Renal ultrasound 12/6  Micro Data:  Urine culture 12/6 Blood culture 12/6   Antimicrobials:  Meropenem, has history of ESBL producing organism 12/6>>. zyvox 12/6>>  Interim history/subjective:  No events, mildly febrile, c/o diffuse pain, chronic.  Objective   Blood pressure (!) 162/98, pulse (!) 102, temperature (!) 100.5 F (38.1 C), resp. rate 16, SpO2 100 %.    Vent Mode: PRVC FiO2 (%):  [40 %-50 %] 40 % Set Rate:  [16 bmp] 16 bmp Vt Set:  [500 mL] 500 mL PEEP:  [5 cmH20] 5 cmH20 Plateau Pressure:  [17 cmH20-21 cmH20] 17 cmH20   Intake/Output Summary (Last 24 hours) at 12/20/2019 1638 Last data filed at 12/20/2019 1332 Gross per 24 hour  Intake 4397.74 ml  Output 2700 ml  Net 1697.74 ml   There were no vitals filed for this visit.  Examination: Constitutional: chronically ill man in NAD  Eyes: eomi, chemosis, pupils reactive to light Ears, nose, mouth, and throat: Trach in place, mild thick secretions Cardiovascular: tachycardic, ext warm, difuse mild anasarca Respiratory: Scattered rhonci, passive on vent Gastrointestinal: soft, PEG in place, +BS Skin: No rashes, normal turgor Neurologic: quadriplegic Psychiatric: RASS 0, answering questions appropriately  Sugars okay INR okay WBC down H/H slightly down Urine growing providencia and klebsiella  Resolved Hospital Problem list    Assessment & Plan:   Severe sepsis w/ septic shock 2/2 UT source.   Improved, GNR in urine. Acute Metabolic Encephalopathy 2/2 sepsis Chronic Respiratory Failure/trach and vent dependence 2/2 neuro-muscular injury/ C3-C5 myelopathy H/o afib DM2 - Continue meropenem, dc linezolid -  Resume TF, home BP and pain meds - Continue warfarin, appreciate  pharmacist input - Continue baseline vent support - Patient stable for transfer back to [facility], will let [facility] team know.  Erskine Emery MD PCCM

## 2019-12-20 NOTE — Progress Notes (Signed)
ANTICOAGULATION CONSULT NOTE - [place] for warfarin Indication: atrial fibrillation  Allergies  Allergen Reactions  . Adhesive [Tape] Rash    Patient Measurements:   Heparin Dosing Weight: n/a   Vital Signs: Temp: 99.1 F (37.3 C) (12/07 0730) BP: 147/81 (12/07 0730) Pulse Rate: 79 (12/07 0730)  Labs: Recent Labs    12/19/19 0710 12/19/19 0710 12/19/19 1311 12/19/19 1728 12/20/19 0637  HGB 9.9*   < > 7.8*  --  8.8*  HCT 30.3*  --  23.0*  --  27.7*  PLT 429*  --   --   --  358  LABPROT  --   --   --  24.5* 23.2*  INR  --   --   --  2.3* 2.1*  CREATININE 0.53*  --   --  0.44* 0.39*   < > = values in this interval not displayed.    CrCl cannot be calculated (Unknown ideal weight.).   Medical History: Past Medical History:  Diagnosis Date  . Anemia   . Anxiety   . Chronic respiratory failure ([place])   . DM2 (diabetes mellitus, type 2) ([place])   . Functional quadriplegia ([facility])   . Hypertension   . Urine retention     Medications:  (Not in a hospital admission)   Assessment: 2 YOM who presented with AMS. Pharmacy consulted to resume PTA warfarin therapy for Afib.   INR today remains therapeutic (INR 2.1 << 2.3, goal of 2-3). The patient has been resumed on PTA dose of 4 mg daily.  CBC stable - no bleeding noted.   Goal of Therapy:  INR 2-3 Monitor platelets by anticoagulation protocol: Yes   Plan:  -Resume home warfarin regimen of 4 mg daily -Monitor daily PT/ INR -Monitor for s/s of bleeding   Thank you for allowing pharmacy to be a part of this patient's care.  Alycia Rossetti, PharmD, BCPS Clinical Pharmacist Clinical phone for 12/20/2019: [phone] 12/20/2019 8:02 AM   **Pharmacist phone directory can now be found on [email or web address] ([email or web address]).  Listed under [place].

## 2019-12-20 NOTE — ED Notes (Signed)
NS to call for replacement colostomy bag

## 2019-12-20 NOTE — Care Management (Signed)
ED CM was made aware that plan is for patient to return to [facility], but when ED Staff called to update [facility] they were made that they could not accept patient back since he has been out of the facility for more than 24 hours. CM contacted [facility], [phone] for clarification he explained that patient would need to be placed back into the pharmacy's  system which cannot be done until the am. Updated ED RN and Charge RN and Dr. Tamala Julian

## 2019-12-20 NOTE — ED Notes (Signed)
[facility] to give report on patient returning after pt was recently D/C by hospitalist. [facility] told this RN that they are unable to take patient back due to pt being here over 24 hours. This RN notified charge RN, hospitalist, and social work.

## 2019-12-20 NOTE — Discharge Summary (Addendum)
Physician Discharge Summary  Patient ID: Luis Orr MRN: [id number] DOB/AGE: [birth year] 69 y.o.  Admit date: 12/19/2019 Discharge date: 12/20/2019  Admission Diagnoses:  Discharge Diagnoses:  Active Problems:   Septic shock ([place])   Severe sepsis ([place])   Sepsis ([place])   Discharged Condition: stable  Hospital Course:  Patient admitted to ER, given fluids, new foley, antibiotics for a urinary tract infection and his blood pressure improved.  Foley was changed.  His white count also improved.  He is stable for transfer back to [facility] for ongoing care.  He should complete 7 days of meropenem for his urinary tract infection.  Consults: None  Significant Diagnostic Studies:  Urine with providencia rettgeri, klebsiella pneumoniai    Discharge Exam: Blood pressure (!) 162/98, pulse (!) 102, temperature (!) 100.5 F (38.1 C), resp. rate 16, SpO2 100 %. General: Elderly male on full mechanical ventilatory support via trach HEENT: Colostomy is in place. Neuro: Both communicate via nods and questions requested to be suctioned CV: Sounds are distant controlled ventricular rate PULM: Mild rhonchi bilaterally Vent pressure regulated volume control FIO2 40% PEEP 5 RATE 18 VT 500 GI: soft, bsx4 active PEG is in place Extremities: warm/dry, 2+ edema  Skin: no rashes or lesions   Disposition: Okay to return to [facility], should be on same regimen as he was sent here with with only new medicine being meropenem.  Discharge summary updated 12/21/2019 per S. Minor, ACNP.  Continue to wait transfer back to [facility].   Allergies as of 12/20/2019      Reactions   Adhesive [tape] Rash      Medication List    TAKE these medications   acetaminophen 325 MG tablet Commonly known as: TYLENOL Place 650 mg into feeding tube every 8 (eight) hours as needed for mild pain, fever or headache.   acetaminophen 160 MG/5ML solution Commonly known as: TYLENOL Place 20.3 mLs (650 mg total)  into feeding tube every 6 (six) hours as needed for mild pain (or Fever >/= 101).   acetaminophen 650 MG suppository Commonly known as: TYLENOL Place 1 suppository (650 mg total) rectally every 6 (six) hours as needed for mild pain (or Fever >/= 101).   amiodarone 200 MG tablet Commonly known as: PACERONE Place 200 mg into feeding tube daily.   amiodarone 360-4.14 MG/200ML-% Soln Commonly known as: NEXTERONE PREMIX Inject 30 mg/hr into the vein continuous. Transition to beta blocker or PO amiodarone when enteral absorption confirmed   carvedilol 6.25 MG tablet Commonly known as: COREG Place 6.25 mg into feeding tube every 12 (twelve) hours.   chlorhexidine 0.12 % solution Commonly known as: PERIDEX 5 mLs by Mouth Rinse route every 12 (twelve) hours.   clonazePAM 0.5 MG tablet Commonly known as: KLONOPIN Place 1 tablet (0.5 mg total) into feeding tube every 8 (eight) hours.   diltiazem 60 MG tablet Commonly known as: CARDIZEM Place 60 mg into feeding tube every 6 (six) hours.   dilTIAZem HCl-Dextrose 125-5 MG/125ML-% Soln infusion Inject 5-15 mg/hr into the vein continuous. Titrate to HR < 110 Hold uptitrating if MAP < 70   docusate 50 MG/5ML liquid Commonly known as: COLACE Place 10 mLs (100 mg total) into feeding tube every 12 (twelve) hours. What changed: how much to take   enoxaparin 120 MG/0.8ML injection Commonly known as: LOVENOX Inject 0.8 mLs (120 mg total) into the skin daily. Transition to NoAC once enteral access improved   famotidine 20 MG tablet Commonly known as: PEPCID Place  1 tablet (20 mg total) into feeding tube at bedtime.   feeding supplement (OSMOLITE 1.5 CAL) Liqd Place 1,000 mLs into feeding tube continuous. What changed: how much to take   fentaNYL 25 MCG/HR Commonly known as: [place] 1 patch onto the skin every 3 (three) days.   gabapentin 300 MG capsule Commonly known as: NEURONTIN Place 300 mg into feeding tube every 12  (twelve) hours.   gabapentin 250 MG/5ML solution Commonly known as: NEURONTIN Place 6 mLs (300 mg total) into feeding tube 2 (two) times daily.   HYDROcodone-acetaminophen 5-325 MG tablet Commonly known as: NORCO/VICODIN Place 1 tablet into feeding tube every 6 (six) hours as needed for moderate pain.   HYDROcodone-acetaminophen 7.5-325 MG tablet Commonly known as: [place] 1 tablet into feeding tube 2 (two) times daily.   insulin glargine 100 UNIT/ML injection Commonly known as: LANTUS Inject 30 Units into the skin daily.   insulin regular 100 units/mL injection Commonly known as: NOVOLIN R Inject 2-10 Units into the skin See admin instructions. Inject 2-10 units into the skin every 6 hours, per sliding scale: BGL 151-200 = 2 units, 201-250= 4 units, 251-300= 6 units, 301-350= 8 units, 351-400 = 10 units and call MD if <60 or >400   ipratropium-albuterol 0.5-2.5 (3) MG/3ML Soln Commonly known as: DUONEB Take 3 mLs by nebulization every 6 (six) hours as needed (for shortness of breath).   lansoprazole 30 MG capsule Commonly known as: PREVACID Place 30 mg into feeding tube daily at 12 noon.   lisinopril 5 MG tablet Commonly known as: ZESTRIL Take 1 tablet (5 mg total) by mouth daily.   LORazepam 2 MG/ML injection Commonly known as: ATIVAN Inject 0.25 mLs (0.5 mg total) into the vein every 4 (four) hours as needed for anxiety. Replace with clonazepam once enteral absorption confirmed.   meropenem 2 g in sodium chloride 0.9 % 100 mL Inject 2 g into the vein every 8 (eight) hours. What changed: Another medication with the same name was added. Make sure you understand how and when to take each.   meropenem 1 g in sodium chloride 0.9 % 100 mL Inject 1 g into the vein every 8 (eight) hours for 6 days. What changed: You were already taking a medication with the same name, and this prescription was added. Make sure you understand how and when to take each.   metoCLOPramide 5  MG/ML injection Commonly known as: REGLAN Inject 2 mLs (10 mg total) into the vein every 6 (six) hours.   multivitamin with minerals Tabs tablet Take 1 tablet by mouth daily. What changed:   how to take this  additional instructions   NORMAL SALINE FLUSH IV Inject 10 mLs into the vein See admin instructions. 10 ml's as needed before and after medications   Olopatadine HCl 0.2 % Soln Place 1 drop into both eyes daily.   ondansetron 4 MG/2ML Soln injection Commonly known as: ZOFRAN Inject 2 mLs (4 mg total) into the vein every 6 (six) hours as needed for nausea.   pantoprazole 40 MG injection Commonly known as: PROTONIX Inject 40 mg into the vein at bedtime.   polyethylene glycol 17 g packet Commonly known as: MIRALAX / GLYCOLAX Take 17 g by mouth daily. What changed: how to take this   scopolamine 1 MG/3DAYS Commonly known as: TRANSDERM-SCOP Place 1 patch onto the skin every 3 (three) days.   sertraline 100 MG tablet Commonly known as: ZOLOFT Place 1 tablet (100 mg total) into  feeding tube at bedtime.   warfarin 4 MG tablet Commonly known as: COUMADIN Take 4 mg by mouth daily.        Signed: Richardson Landry Minor ACNP Acute Care Nurse Practitioner [place] Please consult [place] 12/21/2019, 9:21 AM

## 2019-12-20 NOTE — Progress Notes (Signed)
Patient's spouse updated that patient is stable to transfer back to [facility].

## 2019-12-20 NOTE — Progress Notes (Signed)
Patient transported from Trauma B to 040C without incidence

## 2019-12-20 NOTE — ED Notes (Signed)
This RN & NT unable to obtain morning labs; phlebotomy consulted

## 2019-12-21 DIAGNOSIS — A419 Sepsis, unspecified organism: Secondary | ICD-10-CM | POA: Diagnosis not present

## 2019-12-21 DIAGNOSIS — R6521 Severe sepsis with septic shock: Secondary | ICD-10-CM | POA: Diagnosis not present

## 2019-12-21 LAB — URINE CULTURE

## 2019-12-21 LAB — PHOSPHORUS: Phosphorus: 2.5 mg/dL (ref 2.5–4.6)

## 2019-12-21 LAB — MAGNESIUM: Magnesium: 2.2 mg/dL (ref 1.7–2.4)

## 2019-12-21 LAB — CBG MONITORING, ED
Glucose-Capillary: 145 mg/dL — ABNORMAL HIGH (ref 70–99)
Glucose-Capillary: 142 mg/dL — ABNORMAL HIGH (ref 70–99)

## 2019-12-21 LAB — PROTIME-INR
INR: 2.3 — ABNORMAL HIGH (ref 0.8–1.2)
Prothrombin Time: 24.2 seconds — ABNORMAL HIGH (ref 11.4–15.2)

## 2019-12-21 MED ORDER — GABAPENTIN 300 MG PO CAPS: 300.0000 mg | ORAL_CAPSULE | Freq: Two times a day (BID) | ORAL | Status: DC

## 2019-12-21 MED ORDER — HYDROCODONE-ACETAMINOPHEN 7.5-325 MG PO TABS: 1.0000 | ORAL_TABLET | Freq: Four times a day (QID) | ORAL | Status: DC | PRN

## 2019-12-21 MED ORDER — CARVEDILOL 3.125 MG PO TABS: 6.2500 mg | ORAL_TABLET | Freq: Two times a day (BID) | ORAL | Status: DC

## 2019-12-21 NOTE — ED Notes (Signed)
[facility] unable to transport pt,. [facility] contacted and will have a truck available shortly

## 2019-12-21 NOTE — Progress Notes (Signed)
NAME:  Luis Orr, MRN:  [id number], DOB:  [birth year], LOS: 2 ADMISSION DATE:  12/19/2019, CONSULTATION DATE: 12/6 REFERRING MD:  Septic shock, CHIEF COMPLAINT:  Luis Orr   Brief History   69 year old male, ventilator/trach/PEG/Foley dependent.  History of C3-C5 myelopathy with functional quadriplegia.  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  He is PEG, trach, vent dependent.  Presenting to the emergency room from [place] skilled nursing for altered mental status, hematuria, and decreased urine output.  On arrival to emergency room patient found to be febrile with temperature of 102.3 Fahrenheit, hypotensive with systolic blood pressure as low as the 80s with mild hyperkalemia, leukocytosis, and hematuria.  Broad-spectrum antibiotics were initiated, initial cultures difficult to obtain due to limited IV access, he was administered 4 units of crystalloid with successful improvement of blood pressure.  Of note apparently was in usual state, there is nursing home report that the patient had pulled his catheter out.  This is been replaced prior to presentation he will be admitted to the intensive care for further evaluation and treatment.  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 urinary tract infections Prior MRSA bacteremia June 2021 Last hospitalization: September 24 through October 11, 2019 for urinary tract infection in the setting of obstructive uropathy with right ureteral calculus  for which he underwent double-J ureteral stent Chronic Foley, Due to urinary retention Diverting colostomy PEG tube  Significant Hospital Events   12/6 admitted  Consults:    Procedures:    Significant Diagnostic Tests:  Renal ultrasound 12/6 not done Lab Results  Component Value Date   CREATININE 0.39 (L) 12/20/2019   CREATININE 0.44 (L) 12/19/2019   CREATININE 0.53 (L) 12/19/2019     Micro Data:  Urine culture 12/6 Klebsiella prevention>> Blood culture 12/6   Antimicrobials:  Meropenem, has history of ESBL producing organism 12/6>>. zyvox 12/6>>  Interim history/subjective:  No acute distress awaits transfer back to [facility]  Objective   Blood pressure (!) 150/86, pulse 78, temperature 99.1 F (37.3 C), resp. rate 16, SpO2 100 %.    Vent Mode: PRVC FiO2 (%):  [40 %] 40 % Set Rate:  [16 bmp] 16 bmp Vt Set:  [500 mL] 500 mL PEEP:  [5 cmH20] 5 cmH20 Plateau Pressure:  [15 cmH20-18 cmH20] 18 cmH20   Intake/Output Summary (Last 24 hours) at 12/21/2019 0911 Last data filed at 12/20/2019 1851 Gross per 24 hour  Intake 550 ml  Output --  Net 550 ml   There were no vitals filed for this visit.  Examination: General: Elderly male on full mechanical ventilatory support via trach HEENT: Colostomy is in place. Neuro: Both communicate via nods and questions requested to be suctioned CV: Sounds are distant controlled ventricular rate PULM: Mild rhonchi bilaterally Vent pressure regulated volume control FIO2 40% PEEP 5 RATE 18 VT 500 GI: soft, bsx4 active PEG is in place Extremities: warm/dry, 2+ edema  Skin: no rashes or lesions     Resolved Hospital Problem list    Assessment & Plan:   Severe sepsis w/ septic shock 2/2 UT source.   Sepsis  resolved Currently on antimicrobial therapy for urinary tract infection with prevention and Klebsiella Ready to return to [facility] awaiting ancillary services can make arrangements to transfer  back.  Acute Metabolic Encephalopathy 2/2 sepsis Resolved now awake alert follows command   Chronic Respiratory Failure/trach and vent dependence 2/2 neuro-muscular injury/ C3-C5 myelopathy No significant change Chronic trach and ventilator dependent   H/o afib Currently stable with controlled ventricular rate Currently on Coumadin  DM2 CBG (last 3)  Recent Labs    12/20/19 2352 12/21/19 0402 12/21/19 0824  GLUCAP 114* 145* 142*  Continue sliding scale insulin protocol  He is stable awaits transfer back to [place] ACNP Acute Care Nurse Practitioner [place] Please consult [place] 12/21/2019, 9:12 AM

## 2019-12-21 NOTE — ED Notes (Signed)
[facility] here to transport pt 

## 2019-12-21 NOTE — ED Notes (Signed)
[facility] contacted and will arrange transportation for pt back to [facility]. Estimated a truck will be available in the next 30-45 minutes

## 2019-12-21 NOTE — Progress Notes (Signed)
TOC CM/CSW attempted to contact Prem R/Coordinator at [facility].  CSW left HIPPA compliant message with my contact information.  CSW will continue to follow for dc needs.  Michela Herst Tarpley-Carter, MSW, LCSW-A Pronouns:  She, Her, [place] ED Transitions of CareClinical Social Worker [email or web address] [phone]

## 2019-12-21 NOTE — ED Notes (Signed)
Pt discharged at 1245 however Epic would not allow pt to be discharged off the floor. Documentation at this time completed after pt has been discharged for approx 4.5 hours.

## 2019-12-21 NOTE — Progress Notes (Signed)
ANTICOAGULATION CONSULT NOTE - [place] for warfarin Indication: atrial fibrillation  Allergies  Allergen Reactions  . Adhesive [Tape] Rash    Patient Measurements:    Vital Signs: Temp: 99.1 F (37.3 C) (12/08 0815) BP: 150/86 (12/08 0815) Pulse Rate: 78 (12/08 0815)  Labs: Recent Labs    12/19/19 0710 12/19/19 0710 12/19/19 1311 12/19/19 1728 12/20/19 0637 12/21/19 0435  HGB 9.9*   < > 7.8*  --  8.8*  --   HCT 30.3*  --  23.0*  --  27.7*  --   PLT 429*  --   --   --  358  --   LABPROT  --   --   --  24.5* 23.2* 24.2*  INR  --   --   --  2.3* 2.1* 2.3*  CREATININE 0.53*  --   --  0.44* 0.39*  --    < > = values in this interval not displayed.    CrCl cannot be calculated (Unknown ideal weight.).   Medical History: Past Medical History:  Diagnosis Date  . Anemia   . Anxiety   . Chronic respiratory failure ([place])   . DM2 (diabetes mellitus, type 2) ([place])   . Functional quadriplegia ([facility])   . Hypertension   . Urine retention     Assessment: 76 YOM who presented with AMS. Pharmacy consulted to resume PTA warfarin therapy for Afib.   INR today remains therapeutic at 2.3. The patient has been resumed on PTA dose of 4 mg daily.  CBC stable - no bleeding issues reported.   Goal of Therapy:  INR 2-3 Monitor platelets by anticoagulation protocol: Yes   Plan:  -Continue home warfarin regimen of 4 mg daily -Monitor daily INR, CBC, s/sx bleeding   Arturo Morton, PharmD, BCPS Please check AMION for all [place] contact numbers Clinical Pharmacist 12/21/2019 8:34 AM

## 2019-12-21 NOTE — Progress Notes (Signed)
TOC CM/CSW spoke with [facility].  Pt can be transported back to [facility].  Call Report #:  [phone] Dr. Angelita Ingles  This information will also be shared with RN.  CSW will continue to follow for dc needs.  Demetrick Eichenberger Tarpley-Carter, MSW, LCSW-A Pronouns:  She, Her, [place] ED Transitions of CareClinical Social Worker [email or web address] [phone]

## 2019-12-24 LAB — CULTURE, BLOOD (ROUTINE X 2)
Culture: NO GROWTH
Culture: NO GROWTH

## 2020-07-11 ENCOUNTER — Inpatient Hospital Stay (HOSPITAL_COMMUNITY)
Admission: EM | Admit: 2020-07-11 | Discharge: 2020-11-13 | DRG: 853 | Disposition: E | Payer: 59 | Attending: Internal Medicine | Admitting: Internal Medicine

## 2020-07-11 ENCOUNTER — Encounter (HOSPITAL_COMMUNITY): Payer: Self-pay | Admitting: *Deleted

## 2020-07-11 ENCOUNTER — Inpatient Hospital Stay (HOSPITAL_COMMUNITY): Payer: 59

## 2020-07-11 ENCOUNTER — Emergency Department (HOSPITAL_COMMUNITY): Payer: 59

## 2020-07-11 DIAGNOSIS — A4159 Other Gram-negative sepsis: Secondary | ICD-10-CM | POA: Diagnosis present

## 2020-07-11 DIAGNOSIS — K5732 Diverticulitis of large intestine without perforation or abscess without bleeding: Secondary | ICD-10-CM | POA: Diagnosis present

## 2020-07-11 DIAGNOSIS — L89511 Pressure ulcer of right ankle, stage 1: Secondary | ICD-10-CM | POA: Diagnosis present

## 2020-07-11 DIAGNOSIS — L89154 Pressure ulcer of sacral region, stage 4: Secondary | ICD-10-CM | POA: Diagnosis present

## 2020-07-11 DIAGNOSIS — E43 Unspecified severe protein-calorie malnutrition: Secondary | ICD-10-CM | POA: Diagnosis present

## 2020-07-11 DIAGNOSIS — R29810 Facial weakness: Secondary | ICD-10-CM | POA: Diagnosis not present

## 2020-07-11 DIAGNOSIS — Z20822 Contact with and (suspected) exposure to covid-19: Secondary | ICD-10-CM | POA: Diagnosis present

## 2020-07-11 DIAGNOSIS — R6521 Severe sepsis with septic shock: Secondary | ICD-10-CM | POA: Diagnosis present

## 2020-07-11 DIAGNOSIS — R4182 Altered mental status, unspecified: Secondary | ICD-10-CM | POA: Diagnosis not present

## 2020-07-11 DIAGNOSIS — Z7901 Long term (current) use of anticoagulants: Secondary | ICD-10-CM

## 2020-07-11 DIAGNOSIS — J9690 Respiratory failure, unspecified, unspecified whether with hypoxia or hypercapnia: Secondary | ICD-10-CM

## 2020-07-11 DIAGNOSIS — B965 Pseudomonas (aeruginosa) (mallei) (pseudomallei) as the cause of diseases classified elsewhere: Secondary | ICD-10-CM | POA: Diagnosis not present

## 2020-07-11 DIAGNOSIS — R1310 Dysphagia, unspecified: Secondary | ICD-10-CM

## 2020-07-11 DIAGNOSIS — J181 Lobar pneumonia, unspecified organism: Secondary | ICD-10-CM | POA: Diagnosis present

## 2020-07-11 DIAGNOSIS — J158 Pneumonia due to other specified bacteria: Secondary | ICD-10-CM

## 2020-07-11 DIAGNOSIS — R601 Generalized edema: Secondary | ICD-10-CM | POA: Diagnosis not present

## 2020-07-11 DIAGNOSIS — E875 Hyperkalemia: Secondary | ICD-10-CM | POA: Diagnosis not present

## 2020-07-11 DIAGNOSIS — K7200 Acute and subacute hepatic failure without coma: Secondary | ICD-10-CM | POA: Diagnosis present

## 2020-07-11 DIAGNOSIS — J9622 Acute and chronic respiratory failure with hypercapnia: Secondary | ICD-10-CM | POA: Diagnosis present

## 2020-07-11 DIAGNOSIS — I6389 Other cerebral infarction: Secondary | ICD-10-CM | POA: Diagnosis not present

## 2020-07-11 DIAGNOSIS — J95851 Ventilator associated pneumonia: Secondary | ICD-10-CM | POA: Diagnosis present

## 2020-07-11 DIAGNOSIS — F32A Depression, unspecified: Secondary | ICD-10-CM | POA: Diagnosis present

## 2020-07-11 DIAGNOSIS — J9601 Acute respiratory failure with hypoxia: Secondary | ICD-10-CM | POA: Diagnosis not present

## 2020-07-11 DIAGNOSIS — J9611 Chronic respiratory failure with hypoxia: Secondary | ICD-10-CM | POA: Diagnosis not present

## 2020-07-11 DIAGNOSIS — D509 Iron deficiency anemia, unspecified: Secondary | ICD-10-CM | POA: Diagnosis present

## 2020-07-11 DIAGNOSIS — J9621 Acute and chronic respiratory failure with hypoxia: Secondary | ICD-10-CM | POA: Diagnosis present

## 2020-07-11 DIAGNOSIS — Z933 Colostomy status: Secondary | ICD-10-CM

## 2020-07-11 DIAGNOSIS — M546 Pain in thoracic spine: Secondary | ICD-10-CM | POA: Diagnosis present

## 2020-07-11 DIAGNOSIS — R140 Abdominal distension (gaseous): Secondary | ICD-10-CM | POA: Diagnosis not present

## 2020-07-11 DIAGNOSIS — N170 Acute kidney failure with tubular necrosis: Secondary | ICD-10-CM | POA: Diagnosis present

## 2020-07-11 DIAGNOSIS — Z515 Encounter for palliative care: Secondary | ICD-10-CM | POA: Diagnosis not present

## 2020-07-11 DIAGNOSIS — G8250 Quadriplegia, unspecified: Secondary | ICD-10-CM

## 2020-07-11 DIAGNOSIS — H0520 Unspecified exophthalmos: Secondary | ICD-10-CM | POA: Diagnosis present

## 2020-07-11 DIAGNOSIS — T82868A Thrombosis of vascular prosthetic devices, implants and grafts, initial encounter: Secondary | ICD-10-CM | POA: Diagnosis not present

## 2020-07-11 DIAGNOSIS — Z9911 Dependence on respirator [ventilator] status: Secondary | ICD-10-CM

## 2020-07-11 DIAGNOSIS — R7881 Bacteremia: Secondary | ICD-10-CM | POA: Diagnosis not present

## 2020-07-11 DIAGNOSIS — F419 Anxiety disorder, unspecified: Secondary | ICD-10-CM | POA: Diagnosis present

## 2020-07-11 DIAGNOSIS — Z992 Dependence on renal dialysis: Secondary | ICD-10-CM | POA: Diagnosis not present

## 2020-07-11 DIAGNOSIS — L8990 Pressure ulcer of unspecified site, unspecified stage: Secondary | ICD-10-CM | POA: Diagnosis present

## 2020-07-11 DIAGNOSIS — K558 Other vascular disorders of intestine: Secondary | ICD-10-CM | POA: Diagnosis present

## 2020-07-11 DIAGNOSIS — E1122 Type 2 diabetes mellitus with diabetic chronic kidney disease: Secondary | ICD-10-CM | POA: Diagnosis present

## 2020-07-11 DIAGNOSIS — Z09 Encounter for follow-up examination after completed treatment for conditions other than malignant neoplasm: Secondary | ICD-10-CM

## 2020-07-11 DIAGNOSIS — G9341 Metabolic encephalopathy: Secondary | ICD-10-CM | POA: Diagnosis present

## 2020-07-11 DIAGNOSIS — R680 Hypothermia, not associated with low environmental temperature: Secondary | ICD-10-CM | POA: Diagnosis not present

## 2020-07-11 DIAGNOSIS — J90 Pleural effusion, not elsewhere classified: Secondary | ICD-10-CM | POA: Diagnosis present

## 2020-07-11 DIAGNOSIS — A419 Sepsis, unspecified organism: Secondary | ICD-10-CM

## 2020-07-11 DIAGNOSIS — N179 Acute kidney failure, unspecified: Secondary | ICD-10-CM

## 2020-07-11 DIAGNOSIS — G9349 Other encephalopathy: Secondary | ICD-10-CM | POA: Diagnosis present

## 2020-07-11 DIAGNOSIS — D6489 Other specified anemias: Secondary | ICD-10-CM | POA: Diagnosis present

## 2020-07-11 DIAGNOSIS — E11649 Type 2 diabetes mellitus with hypoglycemia without coma: Secondary | ICD-10-CM | POA: Diagnosis not present

## 2020-07-11 DIAGNOSIS — R739 Hyperglycemia, unspecified: Secondary | ICD-10-CM

## 2020-07-11 DIAGNOSIS — K559 Vascular disorder of intestine, unspecified: Secondary | ICD-10-CM | POA: Diagnosis present

## 2020-07-11 DIAGNOSIS — R7401 Elevation of levels of liver transaminase levels: Secondary | ICD-10-CM

## 2020-07-11 DIAGNOSIS — R609 Edema, unspecified: Secondary | ICD-10-CM | POA: Diagnosis not present

## 2020-07-11 DIAGNOSIS — T82510A Breakdown (mechanical) of surgically created arteriovenous fistula, initial encounter: Secondary | ICD-10-CM | POA: Diagnosis not present

## 2020-07-11 DIAGNOSIS — E876 Hypokalemia: Secondary | ICD-10-CM | POA: Diagnosis not present

## 2020-07-11 DIAGNOSIS — Z7189 Other specified counseling: Secondary | ICD-10-CM

## 2020-07-11 DIAGNOSIS — D649 Anemia, unspecified: Secondary | ICD-10-CM | POA: Diagnosis not present

## 2020-07-11 DIAGNOSIS — D72829 Elevated white blood cell count, unspecified: Secondary | ICD-10-CM

## 2020-07-11 DIAGNOSIS — R532 Functional quadriplegia: Secondary | ICD-10-CM | POA: Diagnosis present

## 2020-07-11 DIAGNOSIS — I959 Hypotension, unspecified: Secondary | ICD-10-CM

## 2020-07-11 DIAGNOSIS — Z794 Long term (current) use of insulin: Secondary | ICD-10-CM

## 2020-07-11 DIAGNOSIS — Z66 Do not resuscitate: Secondary | ICD-10-CM | POA: Diagnosis not present

## 2020-07-11 DIAGNOSIS — Z79899 Other long term (current) drug therapy: Secondary | ICD-10-CM

## 2020-07-11 DIAGNOSIS — Y712 Prosthetic and other implants, materials and accessory cardiovascular devices associated with adverse incidents: Secondary | ICD-10-CM | POA: Diagnosis not present

## 2020-07-11 DIAGNOSIS — Z6826 Body mass index (BMI) 26.0-26.9, adult: Secondary | ICD-10-CM

## 2020-07-11 DIAGNOSIS — M7989 Other specified soft tissue disorders: Secondary | ICD-10-CM | POA: Diagnosis not present

## 2020-07-11 DIAGNOSIS — E1165 Type 2 diabetes mellitus with hyperglycemia: Secondary | ICD-10-CM | POA: Diagnosis not present

## 2020-07-11 DIAGNOSIS — J189 Pneumonia, unspecified organism: Secondary | ICD-10-CM

## 2020-07-11 DIAGNOSIS — G901 Familial dysautonomia [Riley-Day]: Secondary | ICD-10-CM | POA: Diagnosis not present

## 2020-07-11 DIAGNOSIS — Z4659 Encounter for fitting and adjustment of other gastrointestinal appliance and device: Secondary | ICD-10-CM

## 2020-07-11 DIAGNOSIS — L89612 Pressure ulcer of right heel, stage 2: Secondary | ICD-10-CM | POA: Diagnosis present

## 2020-07-11 DIAGNOSIS — Z431 Encounter for attention to gastrostomy: Secondary | ICD-10-CM

## 2020-07-11 DIAGNOSIS — I120 Hypertensive chronic kidney disease with stage 5 chronic kidney disease or end stage renal disease: Secondary | ICD-10-CM | POA: Diagnosis present

## 2020-07-11 DIAGNOSIS — A498 Other bacterial infections of unspecified site: Secondary | ICD-10-CM | POA: Diagnosis not present

## 2020-07-11 DIAGNOSIS — Z7401 Bed confinement status: Secondary | ICD-10-CM

## 2020-07-11 DIAGNOSIS — L89304 Pressure ulcer of unspecified buttock, stage 4: Secondary | ICD-10-CM | POA: Diagnosis not present

## 2020-07-11 DIAGNOSIS — Z452 Encounter for adjustment and management of vascular access device: Secondary | ICD-10-CM

## 2020-07-11 DIAGNOSIS — E871 Hypo-osmolality and hyponatremia: Secondary | ICD-10-CM | POA: Diagnosis not present

## 2020-07-11 DIAGNOSIS — D72828 Other elevated white blood cell count: Secondary | ICD-10-CM | POA: Diagnosis not present

## 2020-07-11 DIAGNOSIS — M109 Gout, unspecified: Secondary | ICD-10-CM | POA: Diagnosis present

## 2020-07-11 DIAGNOSIS — E874 Mixed disorder of acid-base balance: Secondary | ICD-10-CM | POA: Diagnosis present

## 2020-07-11 DIAGNOSIS — R600 Localized edema: Secondary | ICD-10-CM | POA: Diagnosis not present

## 2020-07-11 DIAGNOSIS — A400 Sepsis due to streptococcus, group A: Principal | ICD-10-CM | POA: Diagnosis present

## 2020-07-11 DIAGNOSIS — R339 Retention of urine, unspecified: Secondary | ICD-10-CM | POA: Diagnosis present

## 2020-07-11 DIAGNOSIS — R0602 Shortness of breath: Secondary | ICD-10-CM | POA: Diagnosis present

## 2020-07-11 DIAGNOSIS — Z930 Tracheostomy status: Secondary | ICD-10-CM

## 2020-07-11 DIAGNOSIS — E8809 Other disorders of plasma-protein metabolism, not elsewhere classified: Secondary | ICD-10-CM | POA: Diagnosis present

## 2020-07-11 DIAGNOSIS — N186 End stage renal disease: Secondary | ICD-10-CM | POA: Diagnosis present

## 2020-07-11 DIAGNOSIS — Y848 Other medical procedures as the cause of abnormal reaction of the patient, or of later complication, without mention of misadventure at the time of the procedure: Secondary | ICD-10-CM | POA: Diagnosis present

## 2020-07-11 DIAGNOSIS — N136 Pyonephrosis: Secondary | ICD-10-CM | POA: Diagnosis present

## 2020-07-11 DIAGNOSIS — E2740 Unspecified adrenocortical insufficiency: Secondary | ICD-10-CM | POA: Diagnosis present

## 2020-07-11 DIAGNOSIS — L89816 Pressure-induced deep tissue damage of head: Secondary | ICD-10-CM | POA: Diagnosis not present

## 2020-07-11 DIAGNOSIS — N5089 Other specified disorders of the male genital organs: Secondary | ICD-10-CM | POA: Diagnosis present

## 2020-07-11 DIAGNOSIS — D539 Nutritional anemia, unspecified: Secondary | ICD-10-CM | POA: Diagnosis present

## 2020-07-11 DIAGNOSIS — D631 Anemia in chronic kidney disease: Secondary | ICD-10-CM | POA: Diagnosis present

## 2020-07-11 DIAGNOSIS — G8929 Other chronic pain: Secondary | ICD-10-CM | POA: Diagnosis not present

## 2020-07-11 DIAGNOSIS — Z789 Other specified health status: Secondary | ICD-10-CM | POA: Diagnosis not present

## 2020-07-11 DIAGNOSIS — R579 Shock, unspecified: Secondary | ICD-10-CM | POA: Diagnosis not present

## 2020-07-11 DIAGNOSIS — I953 Hypotension of hemodialysis: Secondary | ICD-10-CM | POA: Diagnosis not present

## 2020-07-11 DIAGNOSIS — L89159 Pressure ulcer of sacral region, unspecified stage: Secondary | ICD-10-CM | POA: Diagnosis present

## 2020-07-11 DIAGNOSIS — I480 Paroxysmal atrial fibrillation: Secondary | ICD-10-CM | POA: Diagnosis present

## 2020-07-11 DIAGNOSIS — L89004 Pressure ulcer of unspecified elbow, stage 4: Secondary | ICD-10-CM | POA: Diagnosis not present

## 2020-07-11 DIAGNOSIS — R34 Anuria and oliguria: Secondary | ICD-10-CM | POA: Diagnosis present

## 2020-07-11 DIAGNOSIS — K7460 Unspecified cirrhosis of liver: Secondary | ICD-10-CM | POA: Diagnosis present

## 2020-07-11 DIAGNOSIS — N185 Chronic kidney disease, stage 5: Secondary | ICD-10-CM | POA: Diagnosis not present

## 2020-07-11 DIAGNOSIS — Z1624 Resistance to multiple antibiotics: Secondary | ICD-10-CM | POA: Diagnosis present

## 2020-07-11 DIAGNOSIS — D75838 Other thrombocytosis: Secondary | ICD-10-CM | POA: Diagnosis present

## 2020-07-11 DIAGNOSIS — K9423 Gastrostomy malfunction: Secondary | ICD-10-CM

## 2020-07-11 DIAGNOSIS — E8770 Fluid overload, unspecified: Secondary | ICD-10-CM | POA: Diagnosis not present

## 2020-07-11 HISTORY — DX: Urinary tract infection, site not specified: N39.0

## 2020-07-11 HISTORY — DX: Resistance to multiple antibiotics: Z16.24

## 2020-07-11 LAB — POCT I-STAT 7, (LYTES, BLD GAS, ICA,H+H)
Acid-Base Excess: 0 mmol/L (ref 0.0–2.0)
Bicarbonate: 26.7 mmol/L (ref 20.0–28.0)
Calcium, Ion: 1.17 mmol/L (ref 1.15–1.40)
HCT: 23 % — ABNORMAL LOW (ref 39.0–52.0)
Hemoglobin: 7.8 g/dL — ABNORMAL LOW (ref 13.0–17.0)
O2 Saturation: 92 %
Patient temperature: 98.1
Potassium: 4.4 mmol/L (ref 3.5–5.1)
Sodium: 135 mmol/L (ref 135–145)
TCO2: 28 mmol/L (ref 22–32)
pCO2 arterial: 51.8 mmHg — ABNORMAL HIGH (ref 32.0–48.0)
pH, Arterial: 7.32 — ABNORMAL LOW (ref 7.350–7.450)
pO2, Arterial: 69 mmHg — ABNORMAL LOW (ref 83.0–108.0)

## 2020-07-11 LAB — CBC WITH DIFFERENTIAL/PLATELET
Abs Immature Granulocytes: 0 10*3/uL (ref 0.00–0.07)
Basophils Absolute: 0 10*3/uL (ref 0.0–0.1)
Basophils Relative: 0 %
Eosinophils Absolute: 0 10*3/uL (ref 0.0–0.5)
Eosinophils Relative: 0 %
HCT: 32.1 % — ABNORMAL LOW (ref 39.0–52.0)
Hemoglobin: 10 g/dL — ABNORMAL LOW (ref 13.0–17.0)
Lymphocytes Relative: 12 %
Lymphs Abs: 1.8 10*3/uL (ref 0.7–4.0)
MCH: 21.1 pg — ABNORMAL LOW (ref 26.0–34.0)
MCHC: 31.2 g/dL (ref 30.0–36.0)
MCV: 67.6 fL — ABNORMAL LOW (ref 80.0–100.0)
Monocytes Absolute: 1 10*3/uL (ref 0.1–1.0)
Monocytes Relative: 7 %
Neutro Abs: 12 10*3/uL — ABNORMAL HIGH (ref 1.7–7.7)
Neutrophils Relative %: 81 %
Platelets: 425 10*3/uL — ABNORMAL HIGH (ref 150–400)
RBC: 4.75 MIL/uL (ref 4.22–5.81)
RDW: 20.9 % — ABNORMAL HIGH (ref 11.5–15.5)
WBC: 14.8 10*3/uL — ABNORMAL HIGH (ref 4.0–10.5)
nRBC: 1.6 % — ABNORMAL HIGH (ref 0.0–0.2)
nRBC: 2 /100 WBC — ABNORMAL HIGH

## 2020-07-11 LAB — I-STAT ARTERIAL BLOOD GAS, ED
Acid-Base Excess: 5 mmol/L — ABNORMAL HIGH (ref 0.0–2.0)
Acid-Base Excess: 0 mmol/L (ref 0.0–2.0)
Bicarbonate: 33.2 mmol/L — ABNORMAL HIGH (ref 20.0–28.0)
Bicarbonate: 27.6 mmol/L (ref 20.0–28.0)
Calcium, Ion: 1.2 mmol/L (ref 1.15–1.40)
Calcium, Ion: 1.15 mmol/L (ref 1.15–1.40)
HCT: 24 % — ABNORMAL LOW (ref 39.0–52.0)
HCT: 25 % — ABNORMAL LOW (ref 39.0–52.0)
Hemoglobin: 8.2 g/dL — ABNORMAL LOW (ref 13.0–17.0)
Hemoglobin: 8.5 g/dL — ABNORMAL LOW (ref 13.0–17.0)
O2 Saturation: 90 %
O2 Saturation: 85 %
Patient temperature: 98.6
Potassium: 5.2 mmol/L — ABNORMAL HIGH (ref 3.5–5.1)
Potassium: 4.5 mmol/L (ref 3.5–5.1)
Sodium: 133 mmol/L — ABNORMAL LOW (ref 135–145)
Sodium: 136 mmol/L (ref 135–145)
TCO2: 35 mmol/L — ABNORMAL HIGH (ref 22–32)
TCO2: 29 mmol/L (ref 22–32)
pCO2 arterial: 74.6 mmHg (ref 32.0–48.0)
pCO2 arterial: 60 mmHg — ABNORMAL HIGH (ref 32.0–48.0)
pH, Arterial: 7.257 — ABNORMAL LOW (ref 7.350–7.450)
pH, Arterial: 7.271 — ABNORMAL LOW (ref 7.350–7.450)
pO2, Arterial: 70 mmHg — ABNORMAL LOW (ref 83.0–108.0)
pO2, Arterial: 59 mmHg — ABNORMAL LOW (ref 83.0–108.0)

## 2020-07-11 LAB — COMPREHENSIVE METABOLIC PANEL
ALT: 19 U/L (ref 0–44)
AST: 27 U/L (ref 15–41)
Albumin: 1.3 g/dL — ABNORMAL LOW (ref 3.5–5.0)
Alkaline Phosphatase: 111 U/L (ref 38–126)
Anion gap: 8 (ref 5–15)
BUN: 100 mg/dL — ABNORMAL HIGH (ref 8–23)
CO2: 28 mmol/L (ref 22–32)
Calcium: 8 mg/dL — ABNORMAL LOW (ref 8.9–10.3)
Chloride: 94 mmol/L — ABNORMAL LOW (ref 98–111)
Creatinine, Ser: 0.85 mg/dL (ref 0.61–1.24)
GFR, Estimated: >60 mL/min (ref >60)
Glucose, Bld: 270 mg/dL — ABNORMAL HIGH (ref 70–99)
Potassium: 5.1 mmol/L (ref 3.5–5.1)
Sodium: 130 mmol/L — ABNORMAL LOW (ref 135–145)
Total Bilirubin: 0.7 mg/dL (ref 0.3–1.2)
Total Protein: 6.8 g/dL (ref 6.5–8.1)

## 2020-07-11 LAB — GLUCOSE, CAPILLARY: Glucose-Capillary: 199 mg/dL — ABNORMAL HIGH (ref 70–99)

## 2020-07-11 LAB — I-STAT CHEM 8, ED
BUN: 109 mg/dL — ABNORMAL HIGH (ref 8–23)
Calcium, Ion: 1.11 mmol/L — ABNORMAL LOW (ref 1.15–1.40)
Chloride: 93 mmol/L — ABNORMAL LOW (ref 98–111)
Creatinine, Ser: 0.9 mg/dL (ref 0.61–1.24)
Glucose, Bld: 274 mg/dL — ABNORMAL HIGH (ref 70–99)
HCT: 15 % — ABNORMAL LOW (ref 39.0–52.0)
Hemoglobin: 5.1 g/dL — CL (ref 13.0–17.0)
Potassium: 5.3 mmol/L — ABNORMAL HIGH (ref 3.5–5.1)
Sodium: 133 mmol/L — ABNORMAL LOW (ref 135–145)
TCO2: 29 mmol/L (ref 22–32)

## 2020-07-11 LAB — TROPONIN I (HIGH SENSITIVITY)
Troponin I (High Sensitivity): 25 ng/L — ABNORMAL HIGH (ref <18)
Troponin I (High Sensitivity): 25 ng/L — ABNORMAL HIGH (ref <18)

## 2020-07-11 LAB — URINALYSIS, ROUTINE W REFLEX MICROSCOPIC
Bilirubin Urine: NEGATIVE
Glucose, UA: NEGATIVE mg/dL
Ketones, ur: NEGATIVE mg/dL
Nitrite: NEGATIVE
Protein, ur: 100 mg/dL — AB
Specific Gravity, Urine: 1.016 (ref 1.005–1.030)
WBC, UA: >50 WBC/hpf — ABNORMAL HIGH (ref 0–5)
pH: 5 (ref 5.0–8.0)

## 2020-07-11 LAB — RESP PANEL BY RT-PCR (FLU A&B, COVID) ARPGX2
Influenza A by PCR: NEGATIVE
Influenza B by PCR: NEGATIVE
SARS Coronavirus 2 by RT PCR: NEGATIVE

## 2020-07-11 LAB — LACTIC ACID, PLASMA
Lactic Acid, Venous: 1.4 mmol/L (ref 0.5–1.9)
Lactic Acid, Venous: 1.5 mmol/L (ref 0.5–1.9)

## 2020-07-11 LAB — CBC
HCT: 24.2 % — ABNORMAL LOW (ref 39.0–52.0)
Hemoglobin: 7.7 g/dL — ABNORMAL LOW (ref 13.0–17.0)
MCH: 21.3 pg — ABNORMAL LOW (ref 26.0–34.0)
MCHC: 31.8 g/dL (ref 30.0–36.0)
MCV: 67 fL — ABNORMAL LOW (ref 80.0–100.0)
Platelets: 568 10*3/uL — ABNORMAL HIGH (ref 150–400)
RBC: 3.61 MIL/uL — ABNORMAL LOW (ref 4.22–5.81)
RDW: 21 % — ABNORMAL HIGH (ref 11.5–15.5)
WBC: 23.2 10*3/uL — ABNORMAL HIGH (ref 4.0–10.5)
nRBC: 0.9 % — ABNORMAL HIGH (ref 0.0–0.2)

## 2020-07-11 LAB — HIV ANTIBODY (ROUTINE TESTING W REFLEX): HIV Screen 4th Generation wRfx: NONREACTIVE

## 2020-07-11 LAB — PROTIME-INR
INR: 1.4 — ABNORMAL HIGH (ref 0.8–1.2)
Prothrombin Time: 17 seconds — ABNORMAL HIGH (ref 11.4–15.2)

## 2020-07-11 LAB — BRAIN NATRIURETIC PEPTIDE: B Natriuretic Peptide: 135 pg/mL — ABNORMAL HIGH (ref 0.0–100.0)

## 2020-07-11 MED ORDER — POLYETHYLENE GLYCOL 3350 17 G PO PACK: 17.0000 g | PACK | Freq: Every day | ORAL | Status: DC | PRN

## 2020-07-11 MED ORDER — VANCOMYCIN VARIABLE DOSE PER UNSTABLE RENAL FUNCTION (PHARMACIST DOSING): Status: DC

## 2020-07-11 MED ORDER — HEPARIN SODIUM (PORCINE) 5000 UNIT/ML IJ SOLN
5000.0000 [IU] | Freq: Three times a day (TID) | INTRAMUSCULAR | Status: DC
  Administered 2020-07-11 – 2020-07-12 (×2): 5000 [IU] via SUBCUTANEOUS
  Filled 2020-07-11 (×2): qty 1

## 2020-07-11 MED ORDER — FENTANYL BOLUS VIA INFUSION
25.0000 ug | INTRAVENOUS | Status: DC | PRN
  Filled 2020-07-11: qty 100

## 2020-07-11 MED ORDER — SODIUM CHLORIDE 0.9 % IV SOLN
2.0000 g | Freq: Three times a day (TID) | INTRAVENOUS | Status: DC
  Administered 2020-07-12 – 2020-07-13 (×5): 2 g via INTRAVENOUS
  Filled 2020-07-11 (×14): qty 2

## 2020-07-11 MED ORDER — FENTANYL CITRATE (PF) 100 MCG/2ML IJ SOLN
25.0000 ug | Freq: Once | INTRAMUSCULAR | Status: AC
  Administered 2020-07-12: 25 ug via INTRAVENOUS
  Filled 2020-07-11: qty 2

## 2020-07-11 MED ORDER — FENTANYL 2500MCG IN NS 250ML (10MCG/ML) PREMIX INFUSION: 25.0000 ug/h | INTRAVENOUS | Status: DC

## 2020-07-11 MED ORDER — CHLORHEXIDINE GLUCONATE 0.12% ORAL RINSE (MEDLINE KIT)
15.0000 mL | Freq: Two times a day (BID) | OROMUCOSAL | Status: DC
  Administered 2020-07-11 – 2020-10-21 (×204): 15 mL via OROMUCOSAL

## 2020-07-11 MED ORDER — AMIODARONE HCL 200 MG PO TABS
200.0000 mg | ORAL_TABLET | Freq: Every day | ORAL | Status: DC
  Administered 2020-07-12 – 2020-10-20 (×100): 200 mg
  Filled 2020-07-11 (×100): qty 1

## 2020-07-11 MED ORDER — PANTOPRAZOLE SODIUM 40 MG IV SOLR
40.0000 mg | Freq: Every day | INTRAVENOUS | Status: DC
  Administered 2020-07-11 – 2020-07-19 (×9): 40 mg via INTRAVENOUS
  Filled 2020-07-11 (×9): qty 40

## 2020-07-11 MED ORDER — CHLORHEXIDINE GLUCONATE CLOTH 2 % EX PADS
6.0000 | MEDICATED_PAD | Freq: Every day | CUTANEOUS | Status: DC
  Administered 2020-07-12: 6 via TOPICAL

## 2020-07-11 MED ORDER — INSULIN ASPART 100 UNIT/ML IJ SOLN
0.0000 [IU] | INTRAMUSCULAR | Status: DC
  Administered 2020-07-11: 5 [IU] via SUBCUTANEOUS
  Administered 2020-07-12 (×4): 3 [IU] via SUBCUTANEOUS
  Administered 2020-07-12: 2 [IU] via SUBCUTANEOUS
  Administered 2020-07-13: 5 [IU] via SUBCUTANEOUS
  Administered 2020-07-13: 8 [IU] via SUBCUTANEOUS
  Administered 2020-07-13: 5 [IU] via SUBCUTANEOUS
  Administered 2020-07-13: 2 [IU] via SUBCUTANEOUS
  Administered 2020-07-13: 3 [IU] via SUBCUTANEOUS
  Administered 2020-07-13 – 2020-07-14 (×2): 5 [IU] via SUBCUTANEOUS
  Administered 2020-07-14: 3 [IU] via SUBCUTANEOUS
  Administered 2020-07-14: 5 [IU] via SUBCUTANEOUS
  Administered 2020-07-14: 3 [IU] via SUBCUTANEOUS
  Administered 2020-07-14: 8 [IU] via SUBCUTANEOUS
  Administered 2020-07-14 – 2020-07-15 (×2): 5 [IU] via SUBCUTANEOUS
  Administered 2020-07-15 (×2): 3 [IU] via SUBCUTANEOUS
  Administered 2020-07-15: 5 [IU] via SUBCUTANEOUS
  Administered 2020-07-15: 3 [IU] via SUBCUTANEOUS
  Administered 2020-07-15: 5 [IU] via SUBCUTANEOUS
  Administered 2020-07-16 (×5): 3 [IU] via SUBCUTANEOUS
  Administered 2020-07-17 (×3): 2 [IU] via SUBCUTANEOUS
  Administered 2020-07-18 (×3): 3 [IU] via SUBCUTANEOUS
  Administered 2020-07-18 (×2): 2 [IU] via SUBCUTANEOUS
  Administered 2020-07-18: 3 [IU] via SUBCUTANEOUS
  Administered 2020-07-19: 5 [IU] via SUBCUTANEOUS
  Administered 2020-07-19 (×2): 3 [IU] via SUBCUTANEOUS
  Administered 2020-07-19 (×2): 5 [IU] via SUBCUTANEOUS
  Administered 2020-07-20: 3 [IU] via SUBCUTANEOUS
  Administered 2020-07-20: 2 [IU] via SUBCUTANEOUS
  Administered 2020-07-20: 5 [IU] via SUBCUTANEOUS
  Administered 2020-07-20: 3 [IU] via SUBCUTANEOUS
  Administered 2020-07-20 (×2): 2 [IU] via SUBCUTANEOUS
  Administered 2020-07-21 (×3): 3 [IU] via SUBCUTANEOUS
  Administered 2020-07-21: 2 [IU] via SUBCUTANEOUS
  Administered 2020-07-21: 5 [IU] via SUBCUTANEOUS
  Administered 2020-07-21: 2 [IU] via SUBCUTANEOUS
  Administered 2020-07-22: 5 [IU] via SUBCUTANEOUS
  Administered 2020-07-22 (×2): 3 [IU] via SUBCUTANEOUS
  Administered 2020-07-22: 8 [IU] via SUBCUTANEOUS
  Administered 2020-07-22 (×3): 5 [IU] via SUBCUTANEOUS
  Administered 2020-07-23 (×3): 3 [IU] via SUBCUTANEOUS
  Administered 2020-07-23 (×2): 5 [IU] via SUBCUTANEOUS
  Administered 2020-07-23: 8 [IU] via SUBCUTANEOUS
  Administered 2020-07-24: 5 [IU] via SUBCUTANEOUS
  Administered 2020-07-24: 8 [IU] via SUBCUTANEOUS

## 2020-07-11 MED ORDER — DOCUSATE SODIUM 50 MG/5ML PO LIQD
100.0000 mg | Freq: Two times a day (BID) | ORAL | Status: DC
  Administered 2020-07-12 – 2020-08-27 (×81): 100 mg
  Filled 2020-07-11 (×83): qty 10

## 2020-07-11 MED ORDER — ORAL CARE MOUTH RINSE
15.0000 mL | OROMUCOSAL | Status: DC
  Administered 2020-07-12 – 2020-10-21 (×1002): 15 mL via OROMUCOSAL

## 2020-07-11 MED ORDER — WARFARIN SODIUM 4 MG PO TABS: 4.0000 mg | ORAL_TABLET | Freq: Every day | ORAL | Status: DC

## 2020-07-11 MED ORDER — SODIUM CHLORIDE 0.9 % IV BOLUS
500.0000 mL | Freq: Once | INTRAVENOUS | Status: AC
  Administered 2020-07-11: 500 mL via INTRAVENOUS

## 2020-07-11 MED ORDER — SODIUM CHLORIDE 0.9 % IV SOLN
2.0000 g | Freq: Once | INTRAVENOUS | Status: AC
  Administered 2020-07-11: 2 g via INTRAVENOUS
  Filled 2020-07-11: qty 2

## 2020-07-11 MED ORDER — VANCOMYCIN HCL 1500 MG/300ML IV SOLN
1500.0000 mg | Freq: Once | INTRAVENOUS | Status: AC
  Administered 2020-07-11: 1500 mg via INTRAVENOUS
  Filled 2020-07-11 (×3): qty 300

## 2020-07-11 MED ORDER — NOREPINEPHRINE 4 MG/250ML-% IV SOLN
0.0000 ug/min | INTRAVENOUS | Status: DC
  Administered 2020-07-11 – 2020-07-12 (×2): 10 ug/min via INTRAVENOUS
  Filled 2020-07-11 (×2): qty 250

## 2020-07-11 MED ORDER — SODIUM CHLORIDE 0.9 % IV BOLUS
1000.0000 mL | Freq: Once | INTRAVENOUS | Status: AC
  Administered 2020-07-11: 1000 mL via INTRAVENOUS

## 2020-07-11 MED ORDER — POLYETHYLENE GLYCOL 3350 17 G PO PACK
17.0000 g | PACK | Freq: Every day | ORAL | Status: DC
  Administered 2020-07-13 – 2020-08-27 (×36): 17 g
  Filled 2020-07-11 (×39): qty 1

## 2020-07-11 MED ORDER — DOCUSATE SODIUM 100 MG PO CAPS: 100.0000 mg | ORAL_CAPSULE | Freq: Two times a day (BID) | ORAL | Status: DC | PRN

## 2020-07-11 MED ORDER — SODIUM ZIRCONIUM CYCLOSILICATE 10 G PO PACK
10.0000 g | PACK | Freq: Once | ORAL | Status: AC
  Administered 2020-07-11: 10 g
  Filled 2020-07-11: qty 1

## 2020-07-11 MED ORDER — RIVAROXABAN 20 MG PO TABS: 20.0000 mg | ORAL_TABLET | Freq: Every day | ORAL | Status: DC

## 2020-07-11 NOTE — Progress Notes (Addendum)
Pt transported with RN and RRT from Trauma C to CT and back with no complications.

## 2020-07-11 NOTE — Progress Notes (Signed)
Pharmacy Antibiotic Note  Luis Orr is a 70 y.o. male admitted on 07/12/2020 with sepsis.  Pharmacy has been consulted for vancomycin and meropenem dosing.  [facility] on n[facility]pinephrine. History of recent pneumonia. Pt is a resident at [facility]. Multiple recent admissions for urinary tract infections, pneumonias, and bacteremia. Remote histories of VRE, ESBL, and MRSA.  SCr today is 0.85. However, this is roughly double the patient's baseline SCr ~0.45. Patient is a quadriplegic  Plan: Vancomycin '1500mg'$  x 1 dose then dose by levels until renal function is stable Next vancomycin level 6/30 0700 Meropenem 2g every 8 hours Pharmacy to adjust dosing/frequency as needed per renal function Follow up cultures to narrow antibiotics as appropriate  Height: '5\' 6"'$  (167.6 cm) (Simultaneous filing. User may not have seen previous data.) IBW/kg (Calculated) : 63.8  No data recorded.  No results for input(s): WBC, CREATININE, LATICACIDVEN, VANCOTROUGH, VANCOPEAK, VANCORANDOM, GENTTROUGH, GENTPEAK, GENTRANDOM, TOBRATROUGH, TOBRAPEAK, TOBRARND, AMIKACINPEAK, AMIKACINTROU, AMIKACIN in the last 168 hours.  CrCl cannot be calculated (Patient's most recent lab result is older than the maximum 21 days allowed.).    Allergies  Allergen Reactions   Adhesive [Tape] Rash    Antimicrobials this admission: cefepime x1 on 6/29  vancomycin 6/29 >> Meropenem 6/29 >>  Microbiology results: Pending  Thank you for allowing pharmacy to be a part of this patient's care.  Heloise Purpura 06/20/2020 6:54 PM

## 2020-07-11 NOTE — H&P (Addendum)
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  He resides at [facility].  He has multiple recent admissions for various infections and septic presentations including urinary tract infections, pneumonias, and bacteremia.  Associated labs include multidrug-resistant Klebsiella, MRSA, and Pseudomonas.  Most recent admission in September 2021 when he presented with sepsis and a positive UA.  Imaging revealed right ureteral calculus and the patient underwent double-J ureteral stent placement.  Sepsis due to MDR Klebsiella treated with meropenem x10 days.  He was discharged back to [facility].  6/29 patient presented most emergency via EMS for altered mental status and hypotension.  Per reports patient is alert and oriented x4 at baseline but had developed altered mental status.  He also had low-grade fevers in had become hypotensive.  This is on the background of undergoing pneumonia treatment for approximately 1 month in [facility] as well as recent worsening of hypertension causing an increase in his routine medications.  Upon EMS arrival to [facility] blood pressure was 80/50 which did not improve with 1 L crystalloid bolus and norepinephrine was started in route.  Upon arrival to the emergency department the patient remained hypotensive and norepinephrine was increased to 10 mcgs with improvement of blood pressure to 120/82.  Patient was also found to be hypoxemic requiring high percent FiO2 on the ventilator.  Thick secretions in ET tube suction and cloudy urine noted by ED team.  PCCM asked to admit.  Laboratory evaluation significant for sodium 130, close  to 70, BUN 100, creatinine 0.85, albumin 1.3, WBC 14.8, hemoglobin 10, platelets 425. Lactic acid 1.4.   Pertinent  Medical History   has a past medical history of Anemia, Anxiety, Chronic respiratory failure ([place]), DM2 (diabetes mellitus, type 2) ([place]), Functional quadriplegia ([place]), Hypertension, and Urine retention.   Significant Hospital Events: Including procedures, antibiotic start and stop dates in addition to other pertinent events   6/29  Interim History / Subjective:    Objective   Blood pressure 107/61, pulse 80, temperature 98.6 F (37 C), temperature source Oral, resp. rate (!) 25, height '5\' 6"'$  (1.676 m), SpO2 98 %.    Vent Mode: PRVC FiO2 (%):  [100 %] 100 % Set Rate:  [15 bmp-24 bmp] 24 bmp Vt Set:  [510 mL] 510 mL PEEP:  [5 cmH20] 5 cmH20 Plateau Pressure:  [31 cmH20] 31 cmH20  No intake or output data in the 24 hours ending 07/05/2020 2014 There were no vitals filed for this visit.  Examination: General: overweight male on vent HENT: [place]/AT, PERRL, no JVD Lungs: Coarse rhonchi throughout Cardiovascular: RRR, no MRG Abdomen: Soft, non-distended Extremities: No acute deformity. 1+ pitting LE edema.  Neuro: Unresponsive. Blinks to threat.  GU: Foley, cloudy straw colored urine.      Resolved Hospital Problem list     Assessment & Plan:   Septic shock (POA): Clinically has pneumonia for sure. High likelihood of UTI as well given history of chronic foley and appearance of urine. Foley exchanged in ED. Stage 4 sacral decub does not appear infected.  - Empiric antibiotics. Will broaden to meropenem and vancomycin pending cultures - Blood, urine, trach aspirate cultures pending - UA pending.  -  Weaning levophed to off with additional IVF - PICC in place from [facility] may need removal pending cultures  Acute on chronic hypoxemic respiratory failure (POA): secondary to pneumonia. 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   - Full vent  support - Currently needing 100% 10 PEEP.  - Repeat ABG - Pulmonary hygiene - CXR in AM  Acute metabolic encephalopathy (POA): acidosis vs uremia - Adjust vent to correct respiratory acidosis  - Ongoing hydration. Follow BMP  Protein calorie malnutrition (POA) - Plan to start TF and consult dietary in the morning.   DM2: - CBG monitoring and SSI  PAF:  - Continue warfarin, amiodarone - Holding home carvedilol, lisinopril, diltiazem  Quadriplegia: due to C3-C5 compressive myelopathy.   - vent as above - supportive care.   Sacral decubitus stage IV (POA) - WOC consult.    Best Practice (right click and "Reselect all SmartList Selections" daily)   Diet/type: NPO DVT prophylaxis: prophylactic heparin  GI prophylaxis: PPI Lines: Central line PICC from [facility] LUE Foley:  Yes, and it is still needed chronic Code Status:  full code Last date of multidisciplinary goals of care discussion '[ ]'$   Labs   CBC: Recent Labs  Lab 06/22/2020 1837 07/06/2020 1922 07/03/2020 1932  WBC  --  14.8*  --   NEUTROABS  --  PENDING  --   HGB 8.2* 10.0* 5.1*  HCT 24.0* 32.1* 15.0*  MCV  --  67.6*  --   PLT  --  425*  --     Basic Metabolic Panel: Recent Labs  Lab 06/16/2020 1837 06/15/2020 1932  NA 133* 133*  K 5.2* 5.3*  CL  --  93*  GLUCOSE  --  274*  BUN  --  109*  CREATININE  --  0.90   GFR: CrCl cannot be calculated (Unknown ideal weight.). Recent Labs  Lab 06/24/2020 1824 07/04/2020 1922  WBC  --  14.8*  LATICACIDVEN 1.4  --     Liver Function Tests: No results for input(s): AST, ALT, ALKPHOS, BILITOT, PROT, ALBUMIN in the last 168 hours. No results for input(s): LIPASE, AMYLASE in the last 168 hours. No results for input(s): AMMONIA in the last 168 hours.  ABG    Component Value Date/Time   PHART 7.257 (L) 06/14/2020 1837   PCO2ART 74.6 ([place]) 06/23/2020 1837   PO2ART 70 (L) 07/10/2020 1837   HCO3 33.2 (H) 07/08/2020 1837   TCO2 29 06/16/2020 1932   O2SAT 90.0  07/07/2020 1837     Coagulation Profile: Recent Labs  Lab 06/19/2020 1922  INR 1.4*    Cardiac Enzymes: No results for input(s): CKTOTAL, CKMB, CKMBINDEX, TROPONINI in the last 168 hours.  HbA1C: Hgb A1c MFr Bld  Date/Time Value Ref Range Status  12/19/2019 07:10 AM 6.1 (H) 4.8 - 5.6 % Final    Comment:    REPEATED TO VERIFY (NOTE) Pre diabetes:          5.7%-6.4%  Diabetes:              >6.4%  Glycemic control for   <7.0% adults with diabetes   10/08/2019 02:57 AM 5.8 (H) 4.8 - 5.6 % Final    Comment:    (NOTE) Pre diabetes:          5.7%-6.4%  Diabetes:              >6.4%  Glycemic control for   <7.0% adults with diabetes     CBG: No results for  input(s): GLUCAP in the last 168 hours.  Review of Systems:   Patient is encephalopathic and/or intubated. Therefore history has been obtained from chart review.    Past Medical History:  He,  has a past medical history of Anemia, Anxiety, Chronic respiratory failure ([place]), DM2 (diabetes mellitus, type 2) ([place]), Functional quadriplegia ([place]), Hypertension, and Urine retention.   Surgical History:   Past Surgical History:  Procedure Laterality Date   CYSTOSCOPY WITH STENT PLACEMENT Right 10/08/2019   Procedure: CYSTOSCOPY WITH  STENT PLACEMENT;  Surgeon: Lucas Mallow, MD;  Location: [facility];  Service: Urology;  Laterality: Right;   PEG PLACEMENT     ROTATOR CUFF REPAIR     TRACHEOSTOMY       Social History:   reports that he has never smoked. He has never used smokeless tobacco.   Family History:  His family history is not on file.   Allergies Allergies  Allergen Reactions   Adhesive [Tape] Rash     Home Medications  Prior to Admission medications   Medication Sig Start Date End Date Taking? Authorizing Provider  acetaminophen (TYLENOL) 160 MG/5ML solution Place 20.3 mLs (650 mg total) into feeding tube every 6 (six) hours as needed for mild pain (or Fever >/= 101). Patient not taking: Reported on  12/19/2019 07/12/19   Allie Bossier, MD  acetaminophen (TYLENOL) 325 MG tablet Place 650 mg into feeding tube every 8 (eight) hours as needed for mild pain, fever or headache.    [provider]  acetaminophen (TYLENOL) 650 MG suppository Place 1 suppository (650 mg total) rectally every 6 (six) hours as needed for mild pain (or Fever >/= 101). Patient not taking: Reported on 12/19/2019 07/12/19   Allie Bossier, MD  amiodarone (PACERONE) 200 MG tablet Place 200 mg into feeding tube daily.    [provider]  carvedilol (COREG) 6.25 MG tablet Place 6.25 mg into feeding tube every 12 (twelve) hours.    [provider]  chlorhexidine (PERIDEX) 0.12 % solution 5 mLs by Mouth Rinse route every 12 (twelve) hours.     [provider]  clonazePAM (KLONOPIN) 0.5 MG tablet Place 1 tablet (0.5 mg total) into feeding tube every 8 (eight) hours. 07/12/19   Allie Bossier, MD  diltiazem (CARDIZEM) 60 MG tablet Place 60 mg into feeding tube every 6 (six) hours.    [provider]  docusate (COLACE) 50 MG/5ML liquid Place 10 mLs (100 mg total) into feeding tube every 12 (twelve) hours. Patient taking differently: Place 50 mg into feeding tube every 12 (twelve) hours.  07/12/19   Allie Bossier, MD  famotidine (PEPCID) 20 MG tablet Place 1 tablet (20 mg total) into feeding tube at bedtime. Patient not taking: Reported on 12/19/2019 07/12/19   Allie Bossier, MD  fentaNYL (DURAGESIC) 25 MCG/HR Place 1 patch onto the skin every 3 (three) days. 07/14/19   Allie Bossier, MD  gabapentin (NEURONTIN) 250 MG/5ML solution Place 6 mLs (300 mg total) into feeding tube 2 (two) times daily. Patient not taking: Reported on 12/19/2019 07/12/19   Allie Bossier, MD  gabapentin (NEURONTIN) 300 MG capsule Place 300 mg into feeding tube every 12 (twelve) hours.    [provider]  HYDROcodone-acetaminophen (NORCO/VICODIN) 5-325 MG tablet Place 1 tablet into feeding tube every 6 (six)  hours as needed for moderate pain.    [provider]  insulin glargine (LANTUS) 100 UNIT/ML injection Inject 30 Units into the skin  daily.    [provider]  insulin regular (NOVOLIN R) 100 units/mL injection Inject 2-10 Units into the skin See admin instructions. Inject 2-10 units into the skin every 6 hours, per sliding scale: BGL 151-200 = 2 units, 201-250= 4 units, 251-300= 6 units, 301-350= 8 units, 351-400 = 10 units and call MD if <60 or >400    [provider]  ipratropium-albuterol (DUONEB) 0.5-2.5 (3) MG/3ML SOLN Take 3 mLs by nebulization every 6 (six) hours as needed (for shortness of breath).     [provider]  lansoprazole (PREVACID) 30 MG capsule Place 30 mg into feeding tube daily at 12 noon.    [provider]  lisinopril (ZESTRIL) 5 MG tablet Take 1 tablet (5 mg total) by mouth daily. Patient not taking: Reported on 12/19/2019 07/13/19   Allie Bossier, MD  LORazepam (ATIVAN) 2 MG/ML injection Inject 0.25 mLs (0.5 mg total) into the vein every 4 (four) hours as needed for anxiety. Replace with clonazepam once enteral absorption confirmed. Patient not taking: Reported on 12/19/2019 10/11/19   Candee Furbish, MD  Multiple Vitamin (MULTIVITAMIN WITH MINERALS) TABS tablet Take 1 tablet by mouth daily. Patient taking differently: Place 1 tablet into feeding tube daily. '18mg'$  iron-412mg-25mcg 07/13/19   WAllie Bossier MD  Nutritional Supplements (FEEDING SUPPLEMENT, OSMOLITE 1.5 CAL,) LIQD Place 1,000 mLs into feeding tube continuous. Patient taking differently: Place 50 mLs into feeding tube continuous.  07/12/19   WAllie Bossier MD  Olopatadine HCl 0.2 % SOLN Place 1 drop into both eyes daily.    [provider]  ondansetron (ZOFRAN) 4 MG/2ML SOLN injection Inject 2 mLs (4 mg total) into the vein every 6 (six) hours as needed for nausea. Patient not taking: Reported on 12/19/2019 10/11/19   SCandee Furbish MD  pantoprazole  (PROTONIX) 40 MG injection Inject 40 mg into the vein at bedtime. Patient not taking: Reported on 12/19/2019 10/11/19   SCandee Furbish MD  polyethylene glycol (MIRALAX / GLYCOLAX) 17 g packet Take 17 g by mouth daily. Patient taking differently: Place 17 g into feeding tube daily.  07/18/19   SCandee Furbish MD  scopolamine (TRANSDERM-SCOP) 1 MG/3DAYS Place 1 patch onto the skin every 3 (three) days.    [provider]  sertraline (ZOLOFT) 100 MG tablet Place 1 tablet (100 mg total) into feeding tube at bedtime. 07/12/19   WAllie Bossier MD  Sodium Chloride Flush (NORMAL SALINE FLUSH IV) Inject 10 mLs into the vein See admin instructions. 10 ml's as needed before and after medications    [provider]  warfarin (COUMADIN) 4 MG tablet Take 4 mg by mouth daily.    [provider]     Critical care time: 5[place]ACNP-BC [place] See Amion for personal pager PCCM on call pager [phone] 7pm. Please call Elink 7p-7a. [phone] 07/08/2020 9:28 PM

## 2020-07-11 NOTE — ED Triage Notes (Signed)
Pt here via [facility] for altered mental status and hypotension.  Pt was more lethargic this am than normal and by 3 pm he was responsive only to pain.  Pt is normally ao x 4. Pt being tx for pneumonia x 1 month.  BP meds were also recently increased for elevated bp.   Given 1L ns en-route and started on levophed.  Arrived on 4 of Levophed with bp of 68 systolic.  Levo increased to 10 upon arrival per [place] - bp now 120/82.  Pt now responding to voice.

## 2020-07-11 NOTE — Sepsis Progress Note (Signed)
[facility] is monitoring this Code Sepsis. °

## 2020-07-11 NOTE — ED Notes (Signed)
After NS bolus, patient's blood pressure downtrending, levo restarted.

## 2020-07-11 NOTE — ED Notes (Signed)
Fentanyl patch removed by Eddie Dibbles, NP., witnessed by this RN.

## 2020-07-11 NOTE — ED Notes (Signed)
Patient transported to CT 

## 2020-07-11 NOTE — ED Provider Notes (Signed)
[facility] EMERGENCY DEPARTMENT Provider Note   CSN: [id number] Arrival date & time: 07/04/2020  1813     History Chief Complaint  Patient presents with   Altered Mental Status    Luis Orr is a 70 y.o. male.  70 year old male with prior medical history as detailed below presents from [facility] for evaluation.  Patient with noted decreased mental status over the last 24 hours.  Patient was sent to the ED for evaluation.  Patient is vent dependent.  He has a midline in his left arm.  EMS transport noted that he was hypotensive on their initial evaluation with a systolic in the 0000000.  Levophed drip initiated prior to transport.  Patient is apparently ANO x3 at baseline.  Over the last 24 hours his mentation has decreased to the point where he is minimally responsive to painful stimuli.  Patient is currently receiving Rocephin for treatment of suspected pneumonia.  Level 5 caveat.  Patient is unable to provide significant history.  The history is provided by the patient and medical records.  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[place])    Hypertension    Urine retention     Patient Active Problem List   Diagnosis Date Noted   Sepsis ([place]) 12/20/2019   Septic shock ([place]) 12/19/2019   Severe sepsis ([place]) 12/19/2019   History of ESBL Klebsiella pneumoniae infection 10/07/2019   History of MRSA infection 10/07/2019   Adynamic ileus ([place]) 10/07/2019   Sacral decubitus ulcer 10/07/2019   AMS (altered mental status) 07/17/2019   SIRS (systemic inflammatory response syndrome) ([place])    Ventilator dependence  ([place])    Bacteremia due to Staphylococcus 07/07/2019   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  Social History   Tobacco Use   Smoking status: Never   Smokeless tobacco: Never    Home Medications Prior to Admission medications   Medication Sig Start Date End Date Taking? Authorizing Provider  acetaminophen (TYLENOL) 160 MG/5ML solution Place 20.3 mLs (650 mg total) into feeding tube every 6 (six) hours as needed for mild pain (or Fever >/= 101). Patient not taking: Reported on 12/19/2019 07/12/19   Allie Bossier, MD  acetaminophen (TYLENOL) 325 MG tablet Place 650 mg into feeding tube every 8 (eight) hours as needed for mild pain, fever or headache.    [provider]  acetaminophen (TYLENOL) 650 MG suppository Place 1 suppository (650 mg total) rectally every 6 (six) hours as needed for mild pain (or Fever >/=  101). Patient not taking: Reported on 12/19/2019 07/12/19   Allie Bossier, MD  amiodarone (PACERONE) 200 MG tablet Place 200 mg into feeding tube daily.    [provider]  carvedilol (COREG) 6.25 MG tablet Place 6.25 mg into feeding tube every 12 (twelve) hours.    [provider]  chlorhexidine (PERIDEX) 0.12 % solution 5 mLs by Mouth Rinse route every 12 (twelve) hours.     [provider]  clonazePAM (KLONOPIN) 0.5 MG tablet Place 1 tablet (0.5 mg total) into feeding tube every 8 (eight) hours.  07/12/19   Allie Bossier, MD  diltiazem (CARDIZEM) 60 MG tablet Place 60 mg into feeding tube every 6 (six) hours.    [provider]  docusate (COLACE) 50 MG/5ML liquid Place 10 mLs (100 mg total) into feeding tube every 12 (twelve) hours. Patient taking differently: Place 50 mg into feeding tube every 12 (twelve) hours.  07/12/19   Allie Bossier, MD  famotidine (PEPCID) 20 MG tablet Place 1 tablet (20 mg total) into feeding tube at bedtime. Patient not taking: Reported on 12/19/2019 07/12/19   Allie Bossier, MD  fentaNYL (DURAGESIC) 25 MCG/HR Place 1 patch onto the skin every 3 (three) days. 07/14/19   Allie Bossier, MD  gabapentin (NEURONTIN) 250 MG/5ML solution Place 6 mLs (300 mg total) into feeding tube 2 (two) times daily. Patient not taking: Reported on 12/19/2019 07/12/19   Allie Bossier, MD  gabapentin (NEURONTIN) 300 MG capsule Place 300 mg into feeding tube every 12 (twelve) hours.    [provider]  HYDROcodone-acetaminophen (NORCO/VICODIN) 5-325 MG tablet Place 1 tablet into feeding tube every 6 (six) hours as needed for moderate pain.    [provider]  insulin glargine (LANTUS) 100 UNIT/ML injection Inject 30 Units into the skin daily.    [provider]  insulin regular (NOVOLIN R) 100 units/mL injection Inject 2-10 Units into the skin See admin instructions. Inject 2-10 units into the skin every 6 hours, per sliding scale: BGL 151-200 = 2 units, 201-250= 4 units, 251-300= 6 units, 301-350= 8 units, 351-400 = 10 units and call MD if <60 or >400    [provider]  ipratropium-albuterol (DUONEB) 0.5-2.5 (3) MG/3ML SOLN Take 3 mLs by nebulization every 6 (six) hours as needed (for shortness of breath).     [provider]  lansoprazole (PREVACID) 30 MG capsule Place 30 mg into feeding tube daily at 12 noon.    [provider]  lisinopril (ZESTRIL) 5 MG tablet Take 1 tablet (5 mg total) by mouth daily. Patient not  taking: Reported on 12/19/2019 07/13/19   Allie Bossier, MD  LORazepam (ATIVAN) 2 MG/ML injection Inject 0.25 mLs (0.5 mg total) into the vein every 4 (four) hours as needed for anxiety. Replace with clonazepam once enteral absorption confirmed. Patient not taking: Reported on 12/19/2019 10/11/19   Candee Furbish, MD  Multiple Vitamin (MULTIVITAMIN WITH MINERALS) TABS tablet Take 1 tablet by mouth daily. Patient taking differently: Place 1 tablet into feeding tube daily. '18mg'$  iron-4103mg-25mcg 07/13/19   WAllie Bossier MD  Nutritional Supplements (FEEDING SUPPLEMENT, OSMOLITE 1.5 CAL,) LIQD Place 1,000 mLs into feeding tube continuous. Patient taking differently: Place 50 mLs into feeding tube continuous.  07/12/19   WAllie Bossier MD  Olopatadine HCl 0.2 % SOLN Place 1 drop into both eyes daily.    [provider]  ondansetron (ZOFRAN) 4 MG/2ML SOLN injection Inject 2 mLs (4  mg total) into the vein every 6 (six) hours as needed for nausea. Patient not taking: Reported on 12/19/2019 10/11/19   Candee Furbish, MD  pantoprazole (PROTONIX) 40 MG injection Inject 40 mg into the vein at bedtime. Patient not taking: Reported on 12/19/2019 10/11/19   Candee Furbish, MD  polyethylene glycol (MIRALAX / GLYCOLAX) 17 g packet Take 17 g by mouth daily. Patient taking differently: Place 17 g into feeding tube daily.  07/18/19   Candee Furbish, MD  scopolamine (TRANSDERM-SCOP) 1 MG/3DAYS Place 1 patch onto the skin every 3 (three) days.    [provider]  sertraline (ZOLOFT) 100 MG tablet Place 1 tablet (100 mg total) into feeding tube at bedtime. 07/12/19   Allie Bossier, MD  Sodium Chloride Flush (NORMAL SALINE FLUSH IV) Inject 10 mLs into the vein See admin instructions. 10 ml's as needed before and after medications    [provider]  warfarin (COUMADIN) 4 MG tablet Take 4 mg by mouth daily.    [provider]    Allergies    Adhesive [tape]  Review of Systems    Review of Systems  Unable to perform ROS: Acuity of condition   Physical Exam Updated Vital Signs BP 94/61   Pulse 79   Temp 98.6 F (37 C) (Oral)   Resp (!) 24   Ht '5\' 6"'$  (1.676 m) Comment: Simultaneous filing. User may not have seen previous data.  SpO2 92%   BMI 26.97 kg/m   Physical Exam Vitals and nursing note reviewed.  Constitutional:      General: He is not in acute distress.    Appearance: He is well-developed.     Comments: Chronically ill in appearance  Status post trach and PEG  Ventilator in place  Levophed drip through left upper extremity midline  HENT:     Head: Normocephalic and atraumatic.  Cardiovascular:     Rate and Rhythm: Normal rate and regular rhythm.     Heart sounds: Normal heart sounds.  Pulmonary:     Effort: No respiratory distress.     Comments: Ventilations in progress through trach  Good air movement bilaterally  Rhonchorous breath sounds noted bilaterally Abdominal:     General: There is no distension.     Palpations: Abdomen is soft.     Tenderness: There is no abdominal tenderness.  Musculoskeletal:        General: Swelling present. No deformity.     Comments: Significant 3+ edema to all 4 extremities  Skin:    General: Skin is warm and dry.  Neurological:     Comments: Obtunded  Minimally responsive to painful stimulation in all 4    ED Results / Procedures / Treatments   Labs (all labs ordered are listed, but only abnormal results are displayed) Labs Reviewed  COMPREHENSIVE METABOLIC PANEL - Abnormal; Notable for the following components:      Result Value   Sodium 130 (*)    Chloride 94 (*)    Glucose, Bld 270 (*)    BUN 100 (*)    Calcium 8.0 (*)    Albumin 1.3 (*)    All other components within normal limits  CBC WITH DIFFERENTIAL/PLATELET - Abnormal; Notable for the following components:   WBC 14.8 (*)    Hemoglobin 10.0 (*)    HCT 32.1 (*)    MCV 67.6 (*)    MCH 21.1 (*)    RDW 20.9 (*)    Platelets  425 (*)    nRBC 1.6 (*)    Neutro Abs 12.0 (*)    nRBC 2 (*)    All other components within normal limits  PROTIME-INR - Abnormal; Notable for the following components:   Prothrombin Time 17.0 (*)    INR 1.4 (*)    All other components within normal limits  BRAIN NATRIURETIC PEPTIDE - Abnormal; Notable for the following components:   B Natriuretic Peptide 135.0 (*)    All other components within normal limits  I-STAT ARTERIAL BLOOD GAS, ED - Abnormal; Notable for the following components:   pH, Arterial 7.257 (*)    pCO2 arterial 74.6 (*)    pO2, Arterial 70 (*)    Bicarbonate 33.2 (*)    TCO2 35 (*)    Acid-Base Excess 5.0 (*)    Sodium 133 (*)    Potassium 5.2 (*)    HCT 24.0 (*)    Hemoglobin 8.2 (*)    All other components within normal limits  I-STAT CHEM 8, ED - Abnormal; Notable for the following components:   Sodium 133 (*)    Potassium 5.3 (*)    Chloride 93 (*)    BUN 109 (*)    Glucose, Bld 274 (*)    Calcium, Ion 1.11 (*)    Hemoglobin 5.1 (*)    HCT 15.0 (*)    All other components within normal limits  TROPONIN I (HIGH SENSITIVITY) - Abnormal; Notable for the following components:   Troponin I (High Sensitivity) 25 (*)    All other components within normal limits  TROPONIN I (HIGH SENSITIVITY) - Abnormal; Notable for the following components:   Troponin I (High Sensitivity) 25 (*)    All other components within normal limits  RESP PANEL BY RT-PCR (FLU A&B, COVID) ARPGX2  URINE CULTURE  CULTURE, BLOOD (ROUTINE X 2)  CULTURE, BLOOD (ROUTINE X 2)  CULTURE, RESPIRATORY W GRAM STAIN  LACTIC ACID, PLASMA  LACTIC ACID, PLASMA  URINALYSIS, ROUTINE W REFLEX MICROSCOPIC  HIV ANTIBODY (ROUTINE TESTING W REFLEX)  BLOOD GAS, ARTERIAL  CBC  BASIC METABOLIC PANEL  MAGNESIUM  PHOSPHORUS  HEMOGLOBIN A1C  CBG MONITORING, ED  TYPE AND SCREEN  ABO/RH    EKG None  Radiology CT Head Wo Contrast  Result Date: 07/01/2020 CLINICAL DATA:  70 year old male with  altered mental status. EXAM: CT HEAD WITHOUT CONTRAST TECHNIQUE: Contiguous axial images were obtained from the base of the skull through the vertex without intravenous contrast. COMPARISON:  None. FINDINGS: Brain: Mild age-related atrophy and chronic microvascular ischemic changes. There is no acute intracranial hemorrhage. No mass effect or midline shift. No extra-axial fluid collection. Vascular: No hyperdense vessel or unexpected calcification. Skull: Normal. Negative for fracture or focal lesion. Sinuses/Orbits: No acute finding. Other: None IMPRESSION: 1. No acute intracranial pathology. 2. Mild age-related atrophy and chronic microvascular ischemic changes. Electronically Signed   By: Anner Crete M.D.   On: 06/19/2020 21:22   DG Chest Port 1 View  Result Date: 06/21/2020 CLINICAL DATA:  Shortness of breath EXAM: PORTABLE CHEST 1 VIEW COMPARISON:  12/19/2019 FINDINGS: Patchy bilateral airspace disease, right greater than left. Heart is borderline in size. Layering bilateral pleural effusions. Tracheostomy overlies the upper to mid trachea, stable. IMPRESSION: Patchy bilateral airspace disease concerning for pneumonia. Layering bilateral effusions. Electronically Signed   By: Rolm Baptise M.D.   On: 07/01/2020 19:23    Procedures Procedures  CRITICAL CARE Performed by: Valarie Merino   Total critical care time:  30 minutes  Critical care time was exclusive of separately billable procedures and treating other patients.  Critical care was necessary to treat or prevent imminent or life-threatening deterioration.  Critical care was time spent personally by me on the following activities: development of treatment plan with patient and/or surrogate as well as nursing, discussions with consultants, evaluation of patient's response to treatment, examination of patient, obtaining history from patient or surrogate, ordering and performing treatments and interventions, ordering and review of  laboratory studies, ordering and review of radiographic studies, pulse oximetry and re-evaluation of patient's condition.   Medications Ordered in ED Medications  norepinephrine (LEVOPHED) '4mg'$  in 223m premix infusion (6 mcg/min Intravenous Rate/Dose Change 07/12/2020 2150)  docusate sodium (COLACE) capsule 100 mg (has no administration in time range)  polyethylene glycol (MIRALAX / GLYCOLAX) packet 17 g (has no administration in time range)  heparin injection 5,000 Units (has no administration in time range)  pantoprazole (PROTONIX) injection 40 mg (has no administration in time range)  insulin aspart (novoLOG) injection 0-15 Units (has no administration in time range)  amiodarone (PACERONE) tablet 200 mg (has no administration in time range)  warfarin (COUMADIN) tablet 4 mg (has no administration in time range)  sodium chloride 0.9 % bolus 500 mL (0 mLs Intravenous Stopped 06/24/2020 2036)  vancomycin (VANCOREADY) IVPB 1500 mg/300 mL (0 mg Intravenous Stopped 06/21/2020 2132)  ceFEPIme (MAXIPIME) 2 g in sodium chloride 0.9 % 100 mL IVPB (0 g Intravenous Stopped 06/18/2020 1952)  sodium chloride 0.9 % bolus 1,000 mL (0 mLs Intravenous Stopped 07/12/2020 2123)    ED Course  I have reviewed the triage vital signs and the nursing notes.  Pertinent labs & imaging results that were available during my care of the patient were reviewed by me and considered in my medical decision making (see chart for details).    MDM Rules/Calculators/A&P                          MDM  MSE complete  LLUMEN GIRARDwas evaluated in Emergency Department on 06/16/2020 for the symptoms described in the history of present illness. He was evaluated in the context of the global COVID-19 pandemic, which necessitated consideration that the patient might be at risk for infection with the SARS-CoV-2 virus that causes COVID-19. Institutional protocols and algorithms that pertain to the evaluation of patients at risk for COVID-19  are in a state of rapid change based on information released by regulatory bodies including the CDC and federal and state organizations. These policies and algorithms were followed during the patient's care in the ED.   [facility]  Patient with alteration in mental status and hypotension.  Blood pressure improved with Levophed drip.  Broad-spectrum antibiotics administered.   Sepsis suspected.   IV fluid resuscitation initiated.  Critical care services aware of case and will evaluate for admission.  Final Clinical Impression(s) / ED Diagnoses Final diagnoses:  Altered mental status, unspecified altered mental status type  Hypotension, unspecified hypotension type    Rx / DC Orders ED Discharge Orders     None        MValarie Merino MD 07/03/2020 2157

## 2020-07-12 ENCOUNTER — Inpatient Hospital Stay (HOSPITAL_COMMUNITY): Payer: 59

## 2020-07-12 DIAGNOSIS — R4182 Altered mental status, unspecified: Secondary | ICD-10-CM | POA: Diagnosis not present

## 2020-07-12 LAB — BASIC METABOLIC PANEL
Anion gap: 11 (ref 5–15)
BUN: 101 mg/dL — ABNORMAL HIGH (ref 8–23)
CO2: 25 mmol/L (ref 22–32)
Calcium: 8.1 mg/dL — ABNORMAL LOW (ref 8.9–10.3)
Chloride: 97 mmol/L — ABNORMAL LOW (ref 98–111)
Creatinine, Ser: 0.81 mg/dL (ref 0.61–1.24)
GFR, Estimated: >60 mL/min (ref >60)
Glucose, Bld: 129 mg/dL — ABNORMAL HIGH (ref 70–99)
Potassium: 4.8 mmol/L (ref 3.5–5.1)
Sodium: 133 mmol/L — ABNORMAL LOW (ref 135–145)

## 2020-07-12 LAB — GLUCOSE, CAPILLARY
Glucose-Capillary: 246 mg/dL — ABNORMAL HIGH (ref 70–99)
Glucose-Capillary: 171 mg/dL — ABNORMAL HIGH (ref 70–99)
Glucose-Capillary: 141 mg/dL — ABNORMAL HIGH (ref 70–99)
Glucose-Capillary: 86 mg/dL (ref 70–99)
Glucose-Capillary: 79 mg/dL (ref 70–99)
Glucose-Capillary: 163 mg/dL — ABNORMAL HIGH (ref 70–99)
Glucose-Capillary: 176 mg/dL — ABNORMAL HIGH (ref 70–99)

## 2020-07-12 LAB — IRON AND TIBC
Iron: 9 ug/dL — ABNORMAL LOW (ref 45–182)
Saturation Ratios: 5 % — ABNORMAL LOW (ref 17.9–39.5)
TIBC: 172 ug/dL — ABNORMAL LOW (ref 250–450)
UIBC: 163 ug/dL

## 2020-07-12 LAB — HEMOGLOBIN A1C
Hgb A1c MFr Bld: 6.3 % — ABNORMAL HIGH (ref 4.8–5.6)
Mean Plasma Glucose: 134 mg/dL

## 2020-07-12 LAB — CBC
HCT: 23.1 % — ABNORMAL LOW (ref 39.0–52.0)
Hemoglobin: 7.3 g/dL — ABNORMAL LOW (ref 13.0–17.0)
MCH: 21 pg — ABNORMAL LOW (ref 26.0–34.0)
MCHC: 31.6 g/dL (ref 30.0–36.0)
MCV: 66.6 fL — ABNORMAL LOW (ref 80.0–100.0)
Platelets: 599 10*3/uL — ABNORMAL HIGH (ref 150–400)
RBC: 3.47 MIL/uL — ABNORMAL LOW (ref 4.22–5.81)
RDW: 21.2 % — ABNORMAL HIGH (ref 11.5–15.5)
WBC: 28.3 10*3/uL — ABNORMAL HIGH (ref 4.0–10.5)
nRBC: 0.8 % — ABNORMAL HIGH (ref 0.0–0.2)

## 2020-07-12 LAB — COMPREHENSIVE METABOLIC PANEL
ALT: 21 U/L (ref 0–44)
AST: 37 U/L (ref 15–41)
Albumin: 1.3 g/dL — ABNORMAL LOW (ref 3.5–5.0)
Alkaline Phosphatase: 118 U/L (ref 38–126)
Anion gap: 8 (ref 5–15)
BUN: 100 mg/dL — ABNORMAL HIGH (ref 8–23)
CO2: 25 mmol/L (ref 22–32)
Calcium: 7.9 mg/dL — ABNORMAL LOW (ref 8.9–10.3)
Chloride: 97 mmol/L — ABNORMAL LOW (ref 98–111)
Creatinine, Ser: 0.81 mg/dL (ref 0.61–1.24)
GFR, Estimated: >60 mL/min (ref >60)
Glucose, Bld: 226 mg/dL — ABNORMAL HIGH (ref 70–99)
Potassium: 5.3 mmol/L — ABNORMAL HIGH (ref 3.5–5.1)
Sodium: 130 mmol/L — ABNORMAL LOW (ref 135–145)
Total Bilirubin: 0.8 mg/dL (ref 0.3–1.2)
Total Protein: 6.8 g/dL (ref 6.5–8.1)

## 2020-07-12 LAB — PHOSPHORUS
Phosphorus: 3.7 mg/dL (ref 2.5–4.6)
Phosphorus: 3.7 mg/dL (ref 2.5–4.6)

## 2020-07-12 LAB — FERRITIN: Ferritin: 243 ng/mL (ref 24–336)

## 2020-07-12 LAB — MAGNESIUM
Magnesium: 2.1 mg/dL (ref 1.7–2.4)
Magnesium: 2.1 mg/dL (ref 1.7–2.4)

## 2020-07-12 LAB — PROCALCITONIN
Procalcitonin: 5.65 ng/mL
Procalcitonin: 10.47 ng/mL

## 2020-07-12 LAB — ABO/RH: ABO/RH(D): O POS

## 2020-07-12 LAB — VANCOMYCIN, RANDOM: Vancomycin Rm: 18

## 2020-07-12 LAB — MRSA NEXT GEN BY PCR, NASAL: MRSA by PCR Next Gen: DETECTED — AB

## 2020-07-12 MED ORDER — RIVAROXABAN 20 MG PO TABS
20.0000 mg | ORAL_TABLET | Freq: Every day | ORAL | Status: DC
  Administered 2020-07-12 – 2020-07-16 (×5): 20 mg
  Filled 2020-07-12 (×5): qty 1

## 2020-07-12 MED ORDER — FUROSEMIDE 10 MG/ML IJ SOLN
40.0000 mg | Freq: Once | INTRAMUSCULAR | Status: AC
  Administered 2020-07-12: 40 mg via INTRAVENOUS
  Filled 2020-07-12: qty 4

## 2020-07-12 MED ORDER — NOREPINEPHRINE 4 MG/250ML-% IV SOLN
2.0000 ug/min | INTRAVENOUS | Status: DC
  Administered 2020-07-12: 2 ug/min via INTRAVENOUS
  Filled 2020-07-12: qty 250

## 2020-07-12 MED ORDER — SODIUM CHLORIDE 0.9% FLUSH
10.0000 mL | Freq: Two times a day (BID) | INTRAVENOUS | Status: DC
  Administered 2020-07-12 – 2020-08-29 (×90): 10 mL
  Administered 2020-08-29: 20 mL
  Administered 2020-08-30 – 2020-09-15 (×32): 10 mL
  Administered 2020-09-16: 20 mL
  Administered 2020-09-16 – 2020-09-17 (×2): 10 mL
  Administered 2020-09-17: 20 mL
  Administered 2020-09-18 – 2020-09-19 (×4): 10 mL
  Administered 2020-09-20: 20 mL
  Administered 2020-09-20 – 2020-10-01 (×23): 10 mL
  Administered 2020-10-02: 20 mL
  Administered 2020-10-02 – 2020-10-03 (×2): 10 mL
  Administered 2020-10-03: 20 mL
  Administered 2020-10-04 – 2020-10-06 (×5): 10 mL
  Administered 2020-10-06: 20 mL
  Administered 2020-10-07 – 2020-10-17 (×22): 10 mL
  Administered 2020-10-18: 20 mL
  Administered 2020-10-18 – 2020-10-21 (×6): 10 mL

## 2020-07-12 MED ORDER — PROSOURCE TF PO LIQD
45.0000 mL | Freq: Four times a day (QID) | ORAL | Status: DC
  Administered 2020-07-12 – 2020-07-18 (×25): 45 mL
  Filled 2020-07-12 (×25): qty 45

## 2020-07-12 MED ORDER — SODIUM CHLORIDE 0.9 % IV SOLN
250.0000 mL | INTRAVENOUS | Status: DC
  Administered 2020-07-12: 250 mL via INTRAVENOUS

## 2020-07-12 MED ORDER — SODIUM CHLORIDE 0.9% FLUSH
10.0000 mL | INTRAVENOUS | Status: DC | PRN
  Administered 2020-10-11 – 2020-10-18 (×2): 10 mL

## 2020-07-12 MED ORDER — VANCOMYCIN HCL 750 MG/150ML IV SOLN
750.0000 mg | Freq: Two times a day (BID) | INTRAVENOUS | Status: DC
  Administered 2020-07-12 – 2020-07-13 (×2): 750 mg via INTRAVENOUS
  Filled 2020-07-12 (×3): qty 150

## 2020-07-12 MED ORDER — CHLORHEXIDINE GLUCONATE CLOTH 2 % EX PADS
6.0000 | MEDICATED_PAD | Freq: Every day | CUTANEOUS | Status: DC
  Administered 2020-07-12 – 2020-08-03 (×22): 6 via TOPICAL

## 2020-07-12 MED ORDER — ADULT MULTIVITAMIN W/MINERALS CH
1.0000 | ORAL_TABLET | Freq: Every day | ORAL | Status: DC
  Administered 2020-07-12 – 2020-07-18 (×7): 1
  Filled 2020-07-12 (×7): qty 1

## 2020-07-12 MED ORDER — MUPIROCIN 2 % EX OINT
1.0000 "application " | TOPICAL_OINTMENT | Freq: Two times a day (BID) | CUTANEOUS | Status: AC
  Administered 2020-07-12 – 2020-07-16 (×10): 1 via NASAL
  Filled 2020-07-12: qty 22

## 2020-07-12 MED ORDER — CHLORHEXIDINE GLUCONATE CLOTH 2 % EX PADS: 6.0000 | MEDICATED_PAD | Freq: Every day | CUTANEOUS | Status: DC

## 2020-07-12 MED ORDER — NOREPINEPHRINE 4 MG/250ML-% IV SOLN
0.0000 ug/min | INTRAVENOUS | Status: DC
  Filled 2020-07-12: qty 250

## 2020-07-12 MED ORDER — OSMOLITE 1.5 CAL PO LIQD
1000.0000 mL | ORAL | Status: DC
  Administered 2020-07-12 – 2020-07-30 (×15): 1000 mL
  Filled 2020-07-12 (×25): qty 1000

## 2020-07-12 NOTE — Consult Note (Signed)
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 Photo taken yesterday evening and uploaded to EMR is appreciated. Wound type: Pressure Pressure Injury POA: Yes Measurement: 3.2cm x 2.4cm x 2.5cm Wound bed: 100% red, moist Drainage (amount, consistency, odor) small amount serous  Periwound: With evidence of previous wound healing, scarring Dressing procedure/placement/frequency: Guidance is provided for Nursing via the Orders for twice daily saline moistened gauze to obliterate the dead space. This will be topped with a dry gauze and secured with the silicone foam bordered dressing for atraumatic securement. Bilateral heels are intact but feet are with edema, so I will add Prevalon boots for pressure redistribution. These will accompany patient to his LTACH post discharge for continued pressure injury prevention intervention. Turning and repositioning is in place and time in the supine position is minimized.  Due to respiratory status, it is sometimes a challenge to have HOB at or below a 30 degree angle, but as able, this is being accomplished.   [place] nursing team will not follow, but will remain available to this patient, the nursing and medical teams.  Please re-consult if needed. Thanks, Maudie Flakes, MSN, RN, [place], Arther Abbott  Pager# [phone]

## 2020-07-12 NOTE — Progress Notes (Signed)
Initial Nutrition Assessment  DOCUMENTATION CODES:   Not applicable  INTERVENTION:   Initiate tube feeds via PEG: - Osmolite 1.5 @ 50 ml/hr (1200 ml/day) - ProSource TF 45 ml QID  Tube feeding regimen provides 1960 kcal, 119 grams of protein, and 914 ml of H2O.   - MVI with minerals daily per tube  NUTRITION DIAGNOSIS:   Increased nutrient needs related to wound healing as evidenced by estimated needs.  GOAL:   Patient will meet greater than or equal to 90% of their needs  MONITOR:   Vent status, Labs, Weight trends, TF tolerance, Skin, I & O's  REASON FOR ASSESSMENT:   Ventilator, Consult Enteral/tube feeding initiation and management  ASSESSMENT:   70 year old male who presented to the ED on 6/29 from [facility] with AMS and hypotension. 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 Pt admitted with septic shock, acute on chronic hypoxemic respiratory failure.  Consult received for tube feeding initiation and management. Pt with PEG tube in place. Discussed pt with RN.  No family present at time of RD visit. Unsure of pt's tube feeding regimen PTA but Osmolite 1.5 @ 50 ml/hr, Juven BID, and Pro-stat daily listed on pt's Home Meds list.  Reviewed weight history in chart. Current weight of 90.5 kg is up from last encounter on 10/10/19 when pt's weight was 75.8 kg. However, pt with moderate pitting generalized edema, deep pitting edema to BUE, and moderate pitting edema to BLE. Difficult to determine pt's dry weight at this time.  Once septic shock resolves, will order Juven per tube to aid in wound healing.  Patient is chronically on ventilator support via trach MV: 11.6 L/min Temp (24hrs), Avg:98.5 F (36.9 C), Min:98 F (36.7 C), Max:99.2 F (37.3 C)  Drips: Fentanyl Levophed  Medications  reviewed and include: colace, SSI q 4 hours, IV protonix, miralax, IV abx   Labs reviewed: BUN 100, hemoglobin 7.8, HCT 23.0 CBG's: 141-246 x 24 hours  NUTRITION - FOCUSED PHYSICAL EXAM:  Flowsheet Row Most Recent Value  Orbital Region Mild depletion  Upper Arm Region No depletion  Thoracic and Lumbar Region No depletion  Buccal Region Mild depletion  Temple Region Mild depletion  Clavicle Bone Region Mild depletion  Clavicle and Acromion Bone Region Mild depletion  Scapular Bone Region Unable to assess  Dorsal Hand Unable to assess  [edema]  Patellar Region Mild depletion  Anterior Thigh Region Mild depletion  Posterior Calf Region Mild depletion  Edema (RD Assessment) Moderate  [BUE, BLE]  Hair Reviewed  Eyes Unable to assess  Mouth Reviewed  Skin Reviewed  Nails Reviewed       Diet Order:   Diet Order             Diet NPO time specified  Diet effective now                   EDUCATION NEEDS:   No education needs have been identified at this time  Skin:  Skin Assessment: Skin Integrity Issues: DTI: sacrum Stage IV: sacrum  Last BM:  no documented BM  Height:   Ht Readings from Last 1 Encounters:  07/04/2020 '5\' 6"'$  (1.676 m)    Weight:   Wt Readings from Last 1 Encounters:  07/02/2020 90.5 kg    Ideal Body Weight:  58 kg (adjusted for quadriplegia)  BMI:  Body mass index is 32.2 kg/m.  Estimated Nutritional Needs:   Kcal:  1900-2100  Protein:  110-130 grams  Fluid:  >/= 2.0 L    Gustavus Bryant, MS, RD, LDN Inpatient Clinical Dietitian Please see AM[facility] for contact information.

## 2020-07-12 NOTE — Progress Notes (Signed)
Pharmacy Antibiotic Note  Luis Orr is a 70 y.o. male admitted on 07/10/2020 with sepsis.  Pharmacy has been consulted for vancomycin and meropenem dosing.  Patient arrived via [facility] on norepinephrine. History of recent pneumonia. Pt is a resident at [facility]. Multiple recent admissions for urinary tract infections, pneumonias, and bacteremia. Remote histories of VRE, ESBL, and MRSA.  SCr today is 0.81. However, this is roughly double the patient's baseline SCr ~0.45. Patient is a functional quadriplegic due to C spine compression.   Vancomycin random level is 18 - drawn 14 hours after one time dose of '1500mg'$  IV x1.   Plan: Vancomycin 750 mg IV every 12 hours Meropenem 2g every 8 hours Pharmacy to adjust dosing/frequency as needed per renal function Follow up cultures to narrow antibiotics as appropriate  Height: '5\' 6"'$  (167.6 cm) (Simultaneous filing. User may not have seen previous data.) Weight: 90.5 kg (199 lb 8.3 oz) IBW/kg (Calculated) : 63.8  Temp (24hrs), Avg:98.5 F (36.9 C), Min:98 F (36.7 C), Max:99.2 F (37.3 C)  Recent Labs  Lab 06/17/2020 1824 06/20/2020 1922 07/02/2020 1932 07/12/2020 2106 06/18/2020 2223 07/12/20 0930  WBC  --  14.8*  --   --  23.2* 28.3*  CREATININE  --  0.85 0.90  --  0.81 0.81  LATICACIDVEN 1.4  --   --  1.5  --   --   VANCORANDOM  --   --   --   --   --  18    Estimated Creatinine Clearance: 89.4 mL/min (by C-G formula based on SCr of 0.81 mg/dL).    Allergies  Allergen Reactions   Adhesive [Tape] Rash    Antimicrobials this admission: cefepime x1 on 6/29  vancomycin 6/29 >> Meropenem 6/29 >>  Microbiology results: Pending  Thank you for allowing pharmacy to be a part of this patient's care.  Sloan Leiter, PharmD, BCPS, BCCCP Clinical Pharmacist Please refer to [facility] for [place] numbers 07/12/2020 12:34 PM

## 2020-07-12 NOTE — Progress Notes (Signed)
[facility] Progress Note Patient Name: Luis Orr DOB: February 25, 1950 MRN: [id number]   Date of Service  07/12/2020  HPI/Events of Note  Patient now has a PICC line so he was switched from peripheral Norepinephrine to the full Norepinephrine gtt dosing.  eICU Interventions  See above.        Frederik Pear 07/12/2020, 6:35 AM

## 2020-07-12 NOTE — Progress Notes (Signed)
[facility] Progress Note Patient Name: Luis Orr DOB: 1950/05/12 MRN: [id number]   Date of Service  07/12/2020  HPI/Events of Note  Patient is a quadriplegic with chronic respiratory failure s/p tracheostomy and PEG admitted with multi-lobar pneumonia and altered mental status, she also had a markedly elevated BUN and an ISTAT hemoglobin of 5 gm / dl but repeat testing indicates that the hemoglobin is actually 7.7.  eICU Interventions  New Patient Evaluation completed. Repeat hemoglobin result noted.        Kerry Kass Sophy Mesler 07/12/2020, 12:53 AM

## 2020-07-12 NOTE — Progress Notes (Signed)
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 Most recent admission September 2021 in setting of sepsis due to MDR Klebsiella with right ureteral calculus s/p double J ureteral stent placement and treated with meropenem x 10days. He was discharged back to [facility].  6/29 patient presented to [facility] via EMS for AMS and hypotension. Per reports, patient is Aox4 at baseline but had developed AMS and had low-grade fevers with hypotension. This is on the background of undergoing pneumonia treatment for approximately one month in [facility] as well as recent worsening of hypertension causing an increase in his routine medications. Upon EMS arrival to [facility], BP 80/50 which did not improve with 1L crystalloid bolus and Levo started en route, requiring uptitration in ED with improvement to 120/82. He was also found to be hypoxemic requiring high percent FiO2 on ventilator. Thick secretions in ET tube suction and cloudy urine noted by ED team. PCCM asked to admit.   Pertinent  Medical History   Past Medical History:  Diagnosis Date   Anemia    Anxiety    Chronic respiratory failure (HCC)    DM2 (diabetes mellitus, type 2) ([place])    Functional quadriplegia ([place])    Hypertension    Urine retention    Significant Hospital Events: Including procedures, antibiotic start and stop dates in addition to other pertinent events   6/29 admitted to ICU for septic shock on levo to maintain MAP>65,  continued on vent support   Interim History / Subjective:  Remains intubated on full ventilator support with FiO2 90%, PEEP 10. Continued on pressors to maintain MAP>65  Objective   Blood pressure (!) 101/56, pulse 79, temperature 98.7 F (37.1 C), temperature source Oral, resp. rate (!) 26, height '5\' 6"'$  (1.676 m), weight 90.5 kg, SpO2 96 %.    Vent Mode: PRVC FiO2 (%):  [90 %-100 %] 90 % Set Rate:  [15 bmp-26 bmp] 26 bmp Vt Set:  [450 mL-510 mL] 450 mL PEEP:  [5 cmH20-10 cmH20] 10 cmH20 Plateau Pressure:  [29 cmH20-40 cmH20] 36 cmH20   Intake/Output Summary (Last 24 hours) at 07/12/2020 0913 Last data filed at 07/12/2020 0800 Gross per 24 hour  Intake 637 ml  Output 155 ml  Net 482 ml   Filed Weights   06/18/2020 2201 07/06/2020 2243  Weight: 90.2 kg 90.5 kg    Examination: General: chronically ill appearing male on full vent support  HENT: [place]/AT, EOMI, PERRL, MMM, ETT in place Lungs: diffuse rhonchi, on full vent support  Cardiovascular: RRR, S1 and S2 present,  Abdomen: distended, soft, +BS, colostomy pouch with minimal watery stools  Extremities: 2+ pitting edema to bilateral lower extremities  Neuro: unresponsive, PERRL GU: Foley in place  Resolved Hospital Problem list     Assessment & Plan:  Septic shock (POA) in setting of HCAP PNA and UTI Hx of MDR Klebseilla, MRSA and Pseudomonas. Has pneumonia for which he was  being treated at [place]. Procal remains elevated >5. UA consistent with UTI. Significant leukocytosis on labs  but remains afebrile. Sacral ulcer does not appear to be infected at this time. Currently on broad spectrum antibiotics.  - Continue vancomycin and meropenem day 2 - F/u blood, urine, and tracheal cultures - Wean pressor support as able for MAP>65  Acute on chronic hypoxemic respiratory failure (POA) Likely 2/2 PNA vs hypervolemia as patient has significant pitting edema in bilat lower extremities (no history of HF so likely 2/2 third spacing from  hypoalbuminemia). CXR with stable multifocal pneumonia and RUL collapse with bilateral pleural effusions. 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  - Continue full vent support, VAP bundle - Pulmonary hygiene - One dose IV Lasix '40mg'$   - Continue broad spectrum abx as above   Acute metabolic encephalopathy (POA) 2/2 acidosis vs uremia  Respiratory and metabolic acidosis; slightly improved. However, remains mostly unresponsive to verbal or physical stimuli.  - Cont to monitor   Protein calorie malnutrition (POA) - Consult to dietician for initiation of tube feeds.   AKI:  Hyperkalemia:  Baseline sCr 0.4, up to 0.9. Likely 2/2 ATN in setting of septic shock as above. However, does have significantly elevated BUN to 100. Intermittent acute anemia on I-stat labs; however, repeat labs with stable Hb. Minimal watery stools in colostomy pouch.  - Continue to monitor - Lokelma 10g once, f/u electrolytes  - Consider GI bleed if worsening BUN or drop in Hb.   Thrombocytosis:  Likely reactive in setting of acute infection as above.  - Trend CBC  DMII - CBG monitoring - SSI   PAF:  Currently NSR.  - Continue amiodarone and xarelto  Sacral decubitus ulcer stage IV (POA) - F/u WOC recs    Best Practice (right click and "Reselect all SmartList Selections" daily)   Diet/type: tubefeeds DVT prophylaxis: DOAC GI prophylaxis: PPI Lines: Central line Foley:  Yes, and it is still needed Code Status:  full code  Critical care time: 35 minutes   Harvie Heck, MD Internal Medicine, PGY-2 07/12/20 9:41 AM Pager # [phone]

## 2020-07-13 DIAGNOSIS — A419 Sepsis, unspecified organism: Secondary | ICD-10-CM | POA: Diagnosis not present

## 2020-07-13 DIAGNOSIS — L8990 Pressure ulcer of unspecified site, unspecified stage: Secondary | ICD-10-CM | POA: Diagnosis present

## 2020-07-13 DIAGNOSIS — R6521 Severe sepsis with septic shock: Secondary | ICD-10-CM | POA: Diagnosis not present

## 2020-07-13 LAB — BLOOD CULTURE ID PANEL (REFLEXED) - BCID2

## 2020-07-13 LAB — CBC
HCT: 21.3 % — ABNORMAL LOW (ref 39.0–52.0)
Hemoglobin: 7.1 g/dL — ABNORMAL LOW (ref 13.0–17.0)
MCH: 21.6 pg — ABNORMAL LOW (ref 26.0–34.0)
MCHC: 33.3 g/dL (ref 30.0–36.0)
MCV: 64.7 fL — ABNORMAL LOW (ref 80.0–100.0)
Platelets: 552 10*3/uL — ABNORMAL HIGH (ref 150–400)
RBC: 3.29 MIL/uL — ABNORMAL LOW (ref 4.22–5.81)
RDW: 20.9 % — ABNORMAL HIGH (ref 11.5–15.5)
WBC: 29.3 10*3/uL — ABNORMAL HIGH (ref 4.0–10.5)
nRBC: 0.6 % — ABNORMAL HIGH (ref 0.0–0.2)

## 2020-07-13 LAB — PROCALCITONIN: Procalcitonin: 11.78 ng/mL

## 2020-07-13 LAB — URINE CULTURE

## 2020-07-13 LAB — VANCOMYCIN, TROUGH: Vancomycin Tr: 34 ug/mL (ref 15–20)

## 2020-07-13 LAB — MAGNESIUM
Magnesium: 2.2 mg/dL (ref 1.7–2.4)
Magnesium: 2.2 mg/dL (ref 1.7–2.4)

## 2020-07-13 LAB — GLUCOSE, CAPILLARY
Glucose-Capillary: 174 mg/dL — ABNORMAL HIGH (ref 70–99)
Glucose-Capillary: 195 mg/dL — ABNORMAL HIGH (ref 70–99)
Glucose-Capillary: 203 mg/dL — ABNORMAL HIGH (ref 70–99)
Glucose-Capillary: 210 mg/dL — ABNORMAL HIGH (ref 70–99)
Glucose-Capillary: 239 mg/dL — ABNORMAL HIGH (ref 70–99)
Glucose-Capillary: 258 mg/dL — ABNORMAL HIGH (ref 70–99)

## 2020-07-13 LAB — BASIC METABOLIC PANEL
Anion gap: 10 (ref 5–15)
BUN: 112 mg/dL — ABNORMAL HIGH (ref 8–23)
CO2: 26 mmol/L (ref 22–32)
Calcium: 8.3 mg/dL — ABNORMAL LOW (ref 8.9–10.3)
Chloride: 98 mmol/L (ref 98–111)
Creatinine, Ser: 0.86 mg/dL (ref 0.61–1.24)
GFR, Estimated: >60 mL/min (ref >60)
Glucose, Bld: 198 mg/dL — ABNORMAL HIGH (ref 70–99)
Potassium: 4.2 mmol/L (ref 3.5–5.1)
Sodium: 134 mmol/L — ABNORMAL LOW (ref 135–145)

## 2020-07-13 LAB — PHOSPHORUS
Phosphorus: 4.4 mg/dL (ref 2.5–4.6)
Phosphorus: 4.8 mg/dL — ABNORMAL HIGH (ref 2.5–4.6)

## 2020-07-13 MED ORDER — VANCOMYCIN VARIABLE DOSE PER UNSTABLE RENAL FUNCTION (PHARMACIST DOSING): Status: DC

## 2020-07-13 MED ORDER — VANCOMYCIN HCL IN DEXTROSE 750-5 MG/150ML-% IV SOLN
750.0000 mg | Freq: Two times a day (BID) | INTRAVENOUS | Status: DC
  Filled 2020-07-13: qty 150

## 2020-07-13 MED ORDER — SODIUM CHLORIDE 0.9 % IV SOLN
2.0000 g | Freq: Two times a day (BID) | INTRAVENOUS | Status: DC
  Administered 2020-07-13 – 2020-07-14 (×2): 2 g via INTRAVENOUS
  Filled 2020-07-13 (×4): qty 2

## 2020-07-13 MED ORDER — INSULIN GLARGINE 100 UNIT/ML ~~LOC~~ SOLN
5.0000 [IU] | Freq: Every day | SUBCUTANEOUS | Status: DC
  Administered 2020-07-13: 5 [IU] via SUBCUTANEOUS
  Filled 2020-07-13 (×2): qty 0.05

## 2020-07-13 NOTE — Progress Notes (Signed)
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 Most recent admission September 2021 in setting of sepsis due to MDR Klebsiella with right ureteral calculus s/p double J ureteral stent placement and treated with meropenem x 10days. He was discharged back to [facility].  6/29 patient presented to [facility] via EMS for AMS and hypotension. Per reports, patient is Aox4 at baseline but had developed AMS and had low-grade fevers with hypotension. This is on the background of undergoing pneumonia treatment for approximately one month in [facility] as well as recent worsening of hypertension causing an increase in his routine medications. Upon EMS arrival to [facility], BP 80/50 which did not improve with 1L crystalloid bolus and Levo started en route, requiring uptitration in ED with improvement to 120/82. He was also found to be hypoxemic requiring high percent FiO2 on ventilator. Thick secretions in ET tube suction and cloudy urine noted by ED team. PCCM asked to admit.   Pertinent  Medical History   Past Medical History:  Diagnosis Date   Anemia    Anxiety    Chronic respiratory failure (HCC)    DM2 (diabetes mellitus, type 2) ([place])    Functional quadriplegia ([place])    Hypertension    Urine retention    Significant Hospital Events: Including procedures, antibiotic start and stop dates in addition to other pertinent events   6/29 admitted to ICU for septic shock on levo to maintain MAP>65,  continued on vent support  Levo off 6/30 at 2100, BP stable 6/29 Blood Cx>> staph species in 1 of 4 cultures drawn>> suspect contaminant 6/29 respiratory Cx>> GS>> Few gram + rods/ Few gram + cocci in pairs, Cx pending 6/29 Urine Cx>> Multiple species>> Repeat collection  Interim History / Subjective:  Remains intubated on full ventilator support with FiO2 70%, PEEP 10 RR of 26.  Levophed weaned to off 6/30 at 2100 Per nursing, secretions are not an issue Net + 729 WBC 29.3, HGB 7.1, Pro calcitonin 11.78 T Max 98.7 CXR 6/30  Retaining cuff of the tracheostomy expands the airway and revision may be warranted. Stable multifocal pulmonary infiltrates, likely infectious. Stable probable right upper lobe collapse.  Stable small bilateral pleural effusions.   Objective   Blood pressure (!) 107/58, pulse 87, temperature 98.6 F (37 C), temperature source Axillary, resp. rate (!) 28, height '5\' 6"'$  (1.676 m), weight 90.8 kg, SpO2 97 %.    Vent Mode: PRVC FiO2 (%):  [70 %-80 %] 70 % Set Rate:  [26 bmp] 26 bmp Vt Set:  [450 mL] 450 mL PEEP:  [10 cmH20] 10 cmH20 Plateau Pressure:  [32 cmH20-36 cmH20] 36 cmH20   Intake/Output Summary (Last 24 hours) at 07/13/2020 0842 Last data filed at 07/13/2020 0700 Gross per 24 hour  Intake 1177.95 ml  Output 930 ml  Net 247.95 ml   Filed Weights   06/21/2020 2201 07/10/2020 2243 07/13/20 0427  Weight: 90.2 kg 90.5 kg 90.8 kg    Examination: General: chronically ill appearing male on full vent support  HENT: /AT, EOMI, PERRL, MMM, ETT secure, intact and  in place Lungs: Bilateral excursion, diffuse rhonchi, diminished per bases, on full vent support  Cardiovascular: RRR, S1 and S2 present, No RMG Abdomen: distended, soft, +BS, colostomy pouch with minimal watery stools  Extremities: 2+ pitting edema to bilateral lower extremities , and upper extremities Neuro: unresponsive, PERRL, eyes closed GU: Foley in place, clear amber with some sediment  noted  Resolved Hospital Problem list     Assessment & Plan:  Septic shock (POA) in setting of HCAP PNA and UTI Hx of MDR Klebseilla, MRSA and Pseudomonas. Has pneumonia for which he was  being treated at [place]. Procal remains elevated >5. UA consistent with UTI. Significant leukocytosis on labs but remains afebrile. Sacral ulcer does not appear to be infected at this time. Currently on broad spectrum antibiotics.  Continued Leukocytosis - Continue vancomycin and meropenem day 3 - F/u blood, urine, and tracheal cultures>> pending  - Wean pressor support as able for MAP>65 - Trend fever curve and WBC  Acute on chronic hypoxemic respiratory failure (POA) Likely 2/2 PNA vs hypervolemia as patient has significant pitting edema in bilat lower extremities (no history of HF so likely 2/2 third spacing from hypoalbuminemia). CXR with stable multifocal pneumonia and RUL collapse with bilateral pleural effusions. 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  - Continue full vent support, VAP bundle - Wean FiO2 and PEEP as able - Aggressive Pulmonary hygiene - Lasix prn - Continue broad spectrum abx as above  - CXR in am and prn - ABG in am and prn  Acute metabolic encephalopathy (POA) 2/2 acidosis vs uremia  Respiratory and metabolic acidosis; slightly improved. However, remains mostly unresponsive to verbal or physical stimuli.  - Cont to monitor  - ABG in am and prn  Protein calorie malnutrition (POA) - TF infusing, at goal   AKI:  Hyperkalemia: >> resolved Baseline sCr 0.4, up to 0.9. Likely 2/2 ATN in setting of septic shock as above. However, does have significantly elevated BUN to 100. Intermittent acute anemia on I-stat labs; however, repeat labs with stable Hb. Minimal watery stools in colostomy pouch.  - Continue to monitor - BMET daily - Lokelma as needed if recurs   Concern for  GI bleed  Iron deficiency ( Iron level 9) BUN 112 ( From 110) HGB 7.1  from 7.3 Plan Trend CBC Consider transfusion if HGB < 7  Thrombocytosis:  Likely reactive in setting of acute infection as above.  Slight down trend - Trend CBC  DMII CBG's > 150 after starting TF On Basal insulin at [facility] - Will add Lantus 5 units daily, adjust as needed based on CBG - CBG monitoring - SSI   PAF:  Currently NSR.  - Continue amiodarone and xarelto  Sacral decubitus ulcer stage IV (POA) - F/u WOC recs    Best Practice (right click and "Reselect all SmartList Selections" daily)   Diet/type: tubefeeds DVT prophylaxis: DOAC GI prophylaxis: PPI Lines: Central line Foley:  Yes, and it is still needed Code Status:  full code  Critical care time: 37 minutes   Magdalen Spatz, MSN, AGACNP-BC [place] for personal pager PCCM on call pager [phone] >> Hospital use only, no office use 07/13/20 8:42 AM

## 2020-07-13 NOTE — Progress Notes (Signed)
Pharmacy Antibiotic Note  Luis Orr is a 70 y.o. male admitted on 07/09/2020 with sepsis.  Pharmacy has been consulted for vancomycin and meropenem dosing.  Patient arrived via [facility] on norepinephrine. History of recent pneumonia. Pt is a resident at [place]. Multiple recent admissions for urinary tract infections, pneumonias, and bacteremia. Remote histories of VRE, ESBL, and MRSA.  SCr today is 0.81. However, this is roughly double the patient's baseline SCr ~0.45. Patient is a functional quadriplegic due to C spine compression.   Vancomycin random level is 18 - drawn 14 hours after one time dose of '1500mg'$  IV x1. Gave 2 doses Vancomycin '750mg'$  - level prior to 3rd dose elevated at 34 -- accumulated despite UOP, not filtering.  Plan: Hold Vancomycin - will plan for level in ~48 hours.  Decrease Meropenem 2g to every 12 hours Pharmacy to adjust dosing/frequency as needed per renal function Follow up cultures to narrow antibiotics as appropriate  Height: '5\' 6"'$  (167.6 cm) (Simultaneous filing. User may not have seen previous data.) Weight: 90.8 kg (200 lb 2.8 oz) IBW/kg (Calculated) : 63.8  Temp (24hrs), Avg:98.4 F (36.9 C), Min:98.3 F (36.8 C), Max:98.6 F (37 C)  Recent Labs  Lab 06/26/2020 1824 07/05/2020 1922 06/14/2020 1932 06/21/2020 2106 06/27/2020 2223 07/12/20 0930 07/13/20 0555 07/13/20 1221  WBC  --  14.8*  --   --  23.2* 28.3* 29.3*  --   CREATININE  --  0.85 0.90  --  0.81 0.81 0.86  --   LATICACIDVEN 1.4  --   --  1.5  --   --   --   --   VANCOTROUGH  --   --   --   --   --   --   --  35*  VANCORANDOM  --   --   --   --   --  18  --   --     Estimated Creatinine Clearance: 84.3 mL/min (by C-G formula based on SCr of 0.86 mg/dL).    Allergies  Allergen Reactions   Adhesive [Tape] Rash    Antimicrobials this admission: cefepime x1 on 6/29 vancomycin 6/29 >> Meropenem 6/29 >>  Microbiology results: 6/30 TA: few Acinetobacter calcoaceticus/baumannii  complex, still pending 6/29 MRSA PCR positive 6/29 BCx 1/4 Staph species (BCID Staph species) 6/29 UCx -suggest recollection 7/1 UCx resent >>  Thank you for allowing pharmacy to be a part of this patient's care.  Sloan Leiter, PharmD, BCPS, BCCCP Clinical Pharmacist Please refer to [facility] for [place] numbers 07/13/2020 2:34 PM

## 2020-07-13 NOTE — Progress Notes (Signed)
PHARMACY - PHYSICIAN COMMUNICATION CRITICAL VALUE ALERT - BLOOD CULTURE IDENTIFICATION (BCID)  Luis Orr is an 70 y.o. male who presented to [facility] on 06/16/2020 from [facility] with a chief complaint of AMS/hypotension, possible sepsis  Assessment:   1/2 blood cultures growing Staph species.  Likely contaminant  Name of physician (or Provider) Contacted:  Dr. Lucile Shutters  Current antibiotics:  Vancomycin and Meropenem  Changes to prescribed antibiotics recommended:  No changes at this time  Results for orders placed or performed during the hospital encounter of 06/27/2020  Blood Culture ID Panel (Reflexed) (Collected: 06/14/2020  6:42 PM)  Result Value Ref Range   Enterococcus faecalis NOT DETECTED NOT DETECTED   Enterococcus Faecium NOT DETECTED NOT DETECTED   Listeria monocytogenes NOT DETECTED NOT DETECTED   Staphylococcus species DETECTED (A) NOT DETECTED   Staphylococcus aureus (BCID) NOT DETECTED NOT DETECTED   Staphylococcus epidermidis NOT DETECTED NOT DETECTED   Staphylococcus lugdunensis NOT DETECTED NOT DETECTED   Streptococcus species NOT DETECTED NOT DETECTED   Streptococcus agalactiae NOT DETECTED NOT DETECTED   Streptococcus pneumoniae NOT DETECTED NOT DETECTED   Streptococcus pyogenes NOT DETECTED NOT DETECTED   A.calcoaceticus-baumannii NOT DETECTED NOT DETECTED   Bacteroides fragilis NOT DETECTED NOT DETECTED   Enterobacterales NOT DETECTED NOT DETECTED   Enterobacter cloacae complex NOT DETECTED NOT DETECTED   Escherichia coli NOT DETECTED NOT DETECTED   Klebsiella aerogenes NOT DETECTED NOT DETECTED   Klebsiella oxytoca NOT DETECTED NOT DETECTED   Klebsiella pneumoniae NOT DETECTED NOT DETECTED   Proteus species NOT DETECTED NOT DETECTED   Salmonella species NOT DETECTED NOT DETECTED   Serratia marcescens NOT DETECTED NOT DETECTED   Haemophilus influenzae NOT DETECTED NOT DETECTED   Neisseria meningitidis NOT DETECTED NOT DETECTED   Pseudomonas  aeruginosa NOT DETECTED NOT DETECTED   Stenotrophomonas maltophilia NOT DETECTED NOT DETECTED   Candida albicans NOT DETECTED NOT DETECTED   Candida auris NOT DETECTED NOT DETECTED   Candida glabrata NOT DETECTED NOT DETECTED   Candida krusei NOT DETECTED NOT DETECTED   Candida parapsilosis NOT DETECTED NOT DETECTED   Candida tropicalis NOT DETECTED NOT DETECTED   Cryptococcus neoformans/gattii NOT DETECTED NOT DETECTED    Caryl Pina 07/13/2020  2:21 AM

## 2020-07-13 DEATH — deceased

## 2020-07-14 ENCOUNTER — Encounter (HOSPITAL_COMMUNITY): Payer: Self-pay | Admitting: Internal Medicine

## 2020-07-14 ENCOUNTER — Inpatient Hospital Stay (HOSPITAL_COMMUNITY): Payer: 59

## 2020-07-14 DIAGNOSIS — E875 Hyperkalemia: Secondary | ICD-10-CM

## 2020-07-14 DIAGNOSIS — A419 Sepsis, unspecified organism: Secondary | ICD-10-CM

## 2020-07-14 DIAGNOSIS — A498 Other bacterial infections of unspecified site: Secondary | ICD-10-CM | POA: Diagnosis not present

## 2020-07-14 DIAGNOSIS — E1165 Type 2 diabetes mellitus with hyperglycemia: Secondary | ICD-10-CM

## 2020-07-14 DIAGNOSIS — R6521 Severe sepsis with septic shock: Secondary | ICD-10-CM

## 2020-07-14 LAB — CBC
HCT: 20.1 % — ABNORMAL LOW (ref 39.0–52.0)
Hemoglobin: 6.8 g/dL — CL (ref 13.0–17.0)
MCH: 21.7 pg — ABNORMAL LOW (ref 26.0–34.0)
MCHC: 33.8 g/dL (ref 30.0–36.0)
MCV: 64 fL — ABNORMAL LOW (ref 80.0–100.0)
Platelets: 507 10*3/uL — ABNORMAL HIGH (ref 150–400)
RBC: 3.14 MIL/uL — ABNORMAL LOW (ref 4.22–5.81)
RDW: 21.1 % — ABNORMAL HIGH (ref 11.5–15.5)
WBC: 28.9 10*3/uL — ABNORMAL HIGH (ref 4.0–10.5)
nRBC: 0.6 % — ABNORMAL HIGH (ref 0.0–0.2)

## 2020-07-14 LAB — CULTURE, BLOOD (ROUTINE X 2)

## 2020-07-14 LAB — CULTURE, RESPIRATORY W GRAM STAIN

## 2020-07-14 LAB — BASIC METABOLIC PANEL
Anion gap: 8 (ref 5–15)
BUN: 132 mg/dL — ABNORMAL HIGH (ref 8–23)
CO2: 26 mmol/L (ref 22–32)
Calcium: 7.5 mg/dL — ABNORMAL LOW (ref 8.9–10.3)
Chloride: 103 mmol/L (ref 98–111)
Creatinine, Ser: 1.15 mg/dL (ref 0.61–1.24)
GFR, Estimated: >60 mL/min (ref >60)
Glucose, Bld: 256 mg/dL — ABNORMAL HIGH (ref 70–99)
Potassium: 5.6 mmol/L — ABNORMAL HIGH (ref 3.5–5.1)
Sodium: 137 mmol/L (ref 135–145)

## 2020-07-14 LAB — HEMOGLOBIN AND HEMATOCRIT, BLOOD
HCT: 25.2 % — ABNORMAL LOW (ref 39.0–52.0)
Hemoglobin: 8.4 g/dL — ABNORMAL LOW (ref 13.0–17.0)

## 2020-07-14 LAB — MAGNESIUM: Magnesium: 2.1 mg/dL (ref 1.7–2.4)

## 2020-07-14 LAB — URINE CULTURE

## 2020-07-14 LAB — GLUCOSE, CAPILLARY
Glucose-Capillary: 256 mg/dL — ABNORMAL HIGH (ref 70–99)
Glucose-Capillary: 214 mg/dL — ABNORMAL HIGH (ref 70–99)
Glucose-Capillary: 178 mg/dL — ABNORMAL HIGH (ref 70–99)
Glucose-Capillary: 227 mg/dL — ABNORMAL HIGH (ref 70–99)
Glucose-Capillary: 220 mg/dL — ABNORMAL HIGH (ref 70–99)
Glucose-Capillary: 165 mg/dL — ABNORMAL HIGH (ref 70–99)

## 2020-07-14 LAB — PREPARE RBC (CROSSMATCH)

## 2020-07-14 LAB — PHOSPHORUS: Phosphorus: 5.5 mg/dL — ABNORMAL HIGH (ref 2.5–4.6)

## 2020-07-14 MED ORDER — SODIUM CHLORIDE 0.9 % IV SOLN
3.0000 g | Freq: Four times a day (QID) | INTRAVENOUS | Status: DC
  Administered 2020-07-14 – 2020-07-18 (×15): 3 g via INTRAVENOUS
  Filled 2020-07-14 (×15): qty 8

## 2020-07-14 MED ORDER — GUAIFENESIN 100 MG/5ML PO SOLN
5.0000 mL | Freq: Two times a day (BID) | ORAL | Status: DC
  Administered 2020-07-14 – 2020-10-20 (×195): 100 mg
  Filled 2020-07-14 (×97): qty 5
  Filled 2020-07-14: qty 25
  Filled 2020-07-14 (×42): qty 5
  Filled 2020-07-14: qty 10
  Filled 2020-07-14 (×55): qty 5

## 2020-07-14 MED ORDER — FUROSEMIDE 10 MG/ML IJ SOLN
20.0000 mg | Freq: Once | INTRAMUSCULAR | Status: AC
  Administered 2020-07-14: 20 mg via INTRAVENOUS
  Filled 2020-07-14: qty 2

## 2020-07-14 MED ORDER — SODIUM ZIRCONIUM CYCLOSILICATE 10 G PO PACK
10.0000 g | PACK | Freq: Once | ORAL | Status: AC
  Administered 2020-07-14: 10 g
  Filled 2020-07-14: qty 1

## 2020-07-14 MED ORDER — INSULIN GLARGINE 100 UNIT/ML ~~LOC~~ SOLN
10.0000 [IU] | Freq: Every day | SUBCUTANEOUS | Status: DC
  Administered 2020-07-14 – 2020-07-15 (×2): 10 [IU] via SUBCUTANEOUS
  Filled 2020-07-14 (×2): qty 0.1

## 2020-07-14 MED ORDER — SODIUM CHLORIDE 0.9% IV SOLUTION: Freq: Once | INTRAVENOUS | Status: AC

## 2020-07-14 MED ORDER — MIDODRINE HCL 5 MG PO TABS
5.0000 mg | ORAL_TABLET | Freq: Three times a day (TID) | ORAL | Status: DC
  Administered 2020-07-15 – 2020-07-17 (×7): 5 mg
  Filled 2020-07-14 (×6): qty 1

## 2020-07-14 MED ORDER — FERROUS SULFATE 220 (44 FE) MG/5ML PO ELIX
220.0000 mg | ORAL_SOLUTION | Freq: Every day | ORAL | Status: DC
  Administered 2020-07-15 – 2020-07-25 (×11): 220 mg
  Filled 2020-07-14 (×13): qty 5

## 2020-07-14 MED ORDER — ALBUTEROL SULFATE (2.5 MG/3ML) 0.083% IN NEBU
2.5000 mg | INHALATION_SOLUTION | Freq: Two times a day (BID) | RESPIRATORY_TRACT | Status: AC
  Administered 2020-07-14 – 2020-07-15 (×4): 2.5 mg via RESPIRATORY_TRACT
  Filled 2020-07-14 (×4): qty 3

## 2020-07-14 MED ORDER — ACETYLCYSTEINE 20 % IN SOLN
4.0000 mL | Freq: Two times a day (BID) | RESPIRATORY_TRACT | Status: AC
  Administered 2020-07-14 – 2020-07-15 (×4): 4 mL via RESPIRATORY_TRACT
  Filled 2020-07-14 (×4): qty 4

## 2020-07-14 MED ORDER — MIDODRINE HCL 5 MG PO TABS
5.0000 mg | ORAL_TABLET | Freq: Three times a day (TID) | ORAL | Status: DC
  Administered 2020-07-14 (×3): 5 mg via ORAL
  Filled 2020-07-14 (×3): qty 1

## 2020-07-14 MED ORDER — FENTANYL CITRATE (PF) 100 MCG/2ML IJ SOLN
25.0000 ug | INTRAMUSCULAR | Status: DC | PRN
Start: 2020-07-14 — End: 2020-07-23
  Administered 2020-07-19: 50 ug via INTRAVENOUS
  Administered 2020-07-20: 100 ug via INTRAVENOUS
  Administered 2020-07-20: 50 ug via INTRAVENOUS
  Filled 2020-07-14 (×3): qty 2

## 2020-07-14 NOTE — Progress Notes (Addendum)
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 Most recent admission September 2021 in setting of sepsis due to MDR Klebsiella with right ureteral calculus s/p double J ureteral stent placement and treated with meropenem x 10days. He was discharged back to [facility].  6/29 patient presented to [facility] via EMS for AMS and hypotension. Per reports, patient is Aox4 at baseline but had developed AMS and had low-grade fevers with hypotension. This is on the background of undergoing pneumonia treatment for approximately one month in [facility] as well as recent worsening of hypertension causing an increase in his routine medications. Upon EMS arrival to [facility], BP 80/50 which did not improve with 1L crystalloid bolus and Levo started en route, requiring uptitration in ED with improvement to 120/82. He was also found to be hypoxemic requiring high percent FiO2 on ventilator. Thick secretions in ET tube suction and cloudy urine noted by ED team. PCCM asked to admit.   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  Anemia  DM II  HTN  Urinary Retention Renal Calculi  Frequent PNA's Chronic [facility] Resident   Significant Hospital Events: Including procedures, antibiotic start and stop dates in addition to other pertinent events   6/29 admitted to ICU for septic shock on levo to maintain MAP>65, continued on vent support   6/29 Blood Cx>> staph species in 1 of 4 cultures drawn>> suspect contaminant 6/29 respiratory Cx>> GS>> Few gram + rods/ Few gram + cocci in pairs, Cx pending 6/29 Urine Cx>> Multiple species>> Repeat collection 6/30 Levophed off at 2130, BP stable  Interim History / Subjective:  Afebrile, tmax 100.1 Vent - 80% FiO2, PEEP 10  Glucose range - 163-256  Thick tracheal secretions  Acinetobacter in tracheal aspirate, sensitivities pending GPC's in blood  Objective   Blood pressure (!) 88/50, pulse 87, temperature 98.8 F (37.1 C), temperature source Oral, resp. rate (!) 26, height '5\' 6"'$  (1.676 m), weight 91.8 kg, SpO2 93 %.    Vent Mode: PRVC FiO2 (%):  [70 %-100 %] 80 % Set Rate:  [26 bmp] 26 bmp Vt Set:  [450 mL] 450 mL PEEP:  [10 cmH20] 10 cmH20 Plateau Pressure:  [36 cmH20-41 cmH20] 39 cmH20   Intake/Output Summary (Last 24 hours) at 07/14/2020 0725 Last data filed at 07/14/2020 0700 Gross per 24 hour  Intake 1448.1 ml  Output 1375 ml  Net 73.1 ml   Filed Weights   06/20/2020 2243 07/13/20 0427 07/14/20 0347  Weight: 90.5 kg 90.8 kg 91.8 kg    Examination: General: ill appearing adult male lying in bed on vent in NAD HEENT: MM pink/moist, trach midline c/d/i, periorbital edema, pupils =/reactive  Neuro: eyes flicker to voice, does not open / no follow commands CV: s1s2 RRR, no m/r/g PULM: non-labored on vent, lungs bilaterally with coarse rhonchi GI: soft, bsx4 active, colostomy bag intact, P Extremities: warm/dry, generalized edema +2 pitting Skin: no rashes or lesions  PCXR 7/2 > rotated, trach in good position,  multifocal infiltrates  Resolved Hospital Problem list   Hyperkalemia  Assessment & Plan:   Septic Shock (POA) in setting of HCAP PNA and UTI Hx of MDR Klebseilla, MRSA and Pseudomonas. Hx of pneumonia for which he was being treated at [place]. Procal remains elevated >5. UA consistent with UTI. Significant leukocytosis on labs but remains afebrile. Sacral  ulcer does not appear to be infected at this time. Currently on broad spectrum antibiotics.  CXR with multifocal infiltrates (PNA vs edema) -continue vancomycin, meropenem, D4 abx -ID consult  -follow cultures to maturity  -add midodrine '5mg'$  Q8  Acute on chronic hypoxemic respiratory failure (POA) Acinetobacter in Tracheal Aspirate Likely 2/2 PNA vs hypervolemia as patient has significant pitting edema in bilat lower extremities (no history of HF so likely 2/2 third spacing from hypoalbuminemia). CXR with stable multifocal pneumonia and RUL collapse with bilateral pleural effusions. 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  -PRVC 8cc/kg as rest mode -wean PEEP / fiO2 for sats >90% -continue abx as above -follow CXR  -ABG PRN  -add mucomyst + albuterol BID x4 doses  -add guaifenesin for secretions -chest PT Q4   Acute metabolic encephalopathy (POA) 2/2 acidosis vs uremia  Respiratory and metabolic acidosis; slightly improved. However, remains mostly unresponsive to verbal or physical stimuli.  -PRN fentanyl for pain, discontinue gtt  -Monitor for bleeding, note elevated BUN, low Hgb   Protein calorie malnutrition (POA) -continue TF   AKI Hyperkalemia Baseline sCr 0.4, up to 0.9. Likely 2/2 ATN in setting of septic shock as above. However, does have significantly elevated BUN to 100. Intermittent acute anemia on I-stat labs; however, repeat labs with stable Hb. Minimal watery stools in colostomy pouch.  -lokelma 10g x1 -Trend BMP / urinary output -Replace electrolytes as indicated -Avoid nephrotoxic agents, ensure adequate renal perfusion  Concern for  GI bleed  Anemia  Iron deficiency (Iron level 9) -follow CBC trend, pending for 7/2. May need PRBC -transfuse if Hgb <7% -PPI  -add ferrous sulfate PT  Thrombocytosis Likely reactive in setting of acute infection as above.  -follow CBC  DM II On Basal insulin at [facility] -increase lantus to 10 units   -SSI, moderate scale  -may need TF coverage or increase SSI to resistant pending trends  PAF Currently NSR.  -continue amiodarone, xarelto   Periorbital Edema / Mild Exophthalmus  -assess TSH  Sacral decubitus ulcer stage IV (POA) -appreciate WOC evaluation    Best Practice (right click and "Reselect all SmartList Selections" daily)   Diet/type: tubefeeds DVT prophylaxis: DOAC GI prophylaxis: PPI Lines: N/A Foley:  Yes, and it is still needed Code Status:  full code  Critical care time:  16 minutes    Noe Gens, MSN, APRN, NP-C, AGACNP-BC [facility] 07/14/2020, 7:25 AM   Please see [email or web address] for pager details.   From 7A-7P if no response, please call [phone] After hours, please call ELink [phone]

## 2020-07-14 NOTE — Consult Note (Addendum)
[facility]    Date of Admission:  07/03/2020   Total days of antibiotics: 3 vanco/merrem               Reason for Consult: HCAP, GPC bacteremia    Referring Provider: Manam   Assessment: Sepsis VDRF/HCAP- acinetobacter BCx+ Hyperkalemia DM2 Elevated Vanco level/therapeutic drug monitoring Anemia, microcytic Decubitus Ulcers  Plan: Continue merrem while we await further ID of sputum Cx.  Await ID of BCx. Continue vanco for now,  WOC f/u for decubitus care Diabetic control Primary to address K+ Appreciate pharm monitoring his Vanco Will need eval of his anemia  Comment- Acinetobacter can be quite difficult to treat. Will await his sensi result prior to considering newer therapies (broader cephalosporins, extended carbapenems).  Typically can improve with good pulm hygiene. Suspect BCx is a contaminant but will wait...    Thank you so much for this interesting consult,  Active Problems:   Septic shock (HCC)   Pressure injury of skin    acetylcysteine  4 mL Nebulization BID   albuterol  2.5 mg Nebulization BID   amiodarone  200 mg Per Tube Daily   chlorhexidine gluconate (MEDLINE KIT)  15 mL Mouth Rinse BID   Chlorhexidine Gluconate Cloth  6 each Topical Q1200   docusate  100 mg Per Tube BID   feeding supplement (PROSource TF)  45 mL Per Tube QID   [START ON 07/15/2020] ferrous sulfate  220 mg Per Tube Q breakfast   guaiFENesin  5 mL Per Tube BID   insulin aspart  0-15 Units Subcutaneous Q4H   insulin glargine  10 Units Subcutaneous Daily   mouth rinse  15 mL Mouth Rinse 10 times per day   midodrine  5 mg Oral Q8H   multivitamin with minerals  1 tablet Per Tube Daily   mupirocin ointment  1 application Nasal BID   pantoprazole (PROTONIX) IV  40 mg Intravenous QHS   polyethylene glycol  17 g Per Tube Daily   rivaroxaban  20 mg Per Tube Daily   sodium chloride flush  10-40 mL Intracatheter Q12H   sodium zirconium  cyclosilicate  10 g Per Tube Once   vancomycin variable dose per unstable renal function (pharmacist dosing)   Does not apply See admin instructions    HPI: CLEOFAS HUDGINS is a 70 y.o. 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 He required ventilation and was noted to have thick secretion in his ET tube. He required levo on adm but has since been off since 6-30. His WBC was 23.2, now 29.3.  BCx: 1/4 GPC Resp Cx- A baumani  CT head- chronic changes.  CXR- patchy airspace dz, effusions  Review of Systems: Review of Systems  Unable to perform ROS: Intubated  unable to perform, pt ventilated.   Past Medical History:  Diagnosis Date   Anemia    Anxiety    Chronic respiratory failure (HCC)    DM2 (diabetes mellitus, type 2) (HCC)    Functional quadriplegia (HCC)    Hypertension    Multiple drug resistant organism (MDRO) culture positive    Urine retention    UTI (urinary tract infection)     Social History  Tobacco Use   Smoking status: Never   Smokeless tobacco: Never    No family history on file.   Medications: Scheduled:  acetylcysteine  4 mL Nebulization BID   albuterol  2.5 mg Nebulization BID   amiodarone  200 mg Per Tube Daily   chlorhexidine gluconate (MEDLINE KIT)  15 mL Mouth Rinse BID   Chlorhexidine Gluconate Cloth  6 each Topical Q1200   docusate  100 mg Per Tube BID   feeding supplement (PROSource TF)  45 mL Per Tube QID   [START ON 07/15/2020] ferrous sulfate  220 mg Per Tube Q breakfast   guaiFENesin  5 mL Per Tube BID   insulin aspart  0-15 Units Subcutaneous Q4H   insulin glargine  10 Units Subcutaneous Daily   mouth rinse  15 mL Mouth Rinse 10 times per day   midodrine  5 mg Oral Q8H   multivitamin with minerals  1 tablet  Per Tube Daily   mupirocin ointment  1 application Nasal BID   pantoprazole (PROTONIX) IV  40 mg Intravenous QHS   polyethylene glycol  17 g Per Tube Daily   rivaroxaban  20 mg Per Tube Daily   sodium chloride flush  10-40 mL Intracatheter Q12H   sodium zirconium cyclosilicate  10 g Per Tube Once   vancomycin variable dose per unstable renal function (pharmacist dosing)   Does not apply See admin instructions    Abtx:  Anti-infectives (From admission, onward)    Start     Dose/Rate Route Frequency Ordered Stop   07/13/20 2000  meropenem (MERREM) 2 g in sodium chloride 0.9 % 100 mL IVPB        2 g 200 mL/hr over 30 Minutes Intravenous Every 12 hours 07/13/20 1433     07/13/20 1432  vancomycin variable dose per unstable renal function (pharmacist dosing)         Does not apply See admin instructions 07/13/20 1433     07/13/20 1400  vancomycin (VANCOCIN) IVPB 750 mg/150 ml premix  Status:  Discontinued        750 mg 150 mL/hr over 60 Minutes Intravenous Every 12 hours 07/13/20 1321 07/13/20 1430   07/12/20 1248  vancomycin (VANCOREADY) IVPB 750 mg/150 mL  Status:  Discontinued        750 mg 150 mL/hr over 60 Minutes Intravenous Every 12 hours 07/12/20 1248 07/13/20 1321   07/02/2020 2300  meropenem (MERREM) 2 g in sodium chloride 0.9 % 100 mL IVPB  Status:  Discontinued        2 g 200 mL/hr over 30 Minutes Intravenous Every 8 hours 06/19/2020 2209 07/13/20 1433   06/13/2020 2205  vancomycin variable dose per unstable renal function (pharmacist dosing)  Status:  Discontinued         Does not apply See admin instructions 06/28/2020 2209 07/12/20 1248   07/06/2020 1845  vancomycin (VANCOREADY) IVPB 1500 mg/300 mL        1,500 mg 150 mL/hr over 120 Minutes Intravenous  Once 06/14/2020 1843 06/20/2020 2132   06/20/2020 1845  ceFEPIme (MAXIPIME) 2 g in sodium chloride 0.9 % 100 mL IVPB        2 g 200 mL/hr over 30 Minutes Intravenous  Once 07/10/2020 1843 06/22/2020 1952         OBJECTIVE: Blood  pressure 119/63, pulse 94, temperature 98.2 F (36.8 C), temperature source Axillary, resp. rate (!) 26, height _0  (1.676 m), weight 91.8 kg, SpO2 97 %.  Physical  Exam Vitals reviewed.  Constitutional:      Appearance: He is obese. He is ill-appearing.  HENT:     Mouth/Throat:     Mouth: Mucous membranes are dry.  Eyes:     Pupils: Pupils are equal, round, and reactive to light.  Cardiovascular:     Rate and Rhythm: Normal rate and regular rhythm.  Pulmonary:     Breath sounds: Rhonchi present.  Abdominal:     General: There is no distension.     Palpations: Abdomen is soft.     Tenderness: There is no abdominal tenderness. There is no guarding.     Comments: Ostomy in LLQ Decreased BS  Musculoskeletal:     Right lower leg: Edema present.     Left lower leg: Edema present.     Comments: anasarca    Lab Results Results for orders placed or performed during the hospital encounter of 07/06/2020 (from the past 48 hour(s))  Vancomycin, random     Status: None   Collection Time: 07/12/20  9:30 AM  Result Value Ref Range   Vancomycin Rm 18     Comment:        Random Vancomycin therapeutic range is dependent on dosage and time of specimen collection. A peak range is 20.0-40.0 ug/mL A trough range is 5.0-15.0 ug/mL        Performed at [facility] [address], [place], [place] [address]   Basic metabolic panel     Status: Abnormal   Collection Time: 07/12/20  9:30 AM  Result Value Ref Range   Sodium 133 (L) 135 - 145 mmol/L   Potassium 4.8 3.5 - 5.1 mmol/L   Chloride 97 (L) 98 - 111 mmol/L   CO2 25 22 - 32 mmol/L   Glucose, Bld 129 (H) 70 - 99 mg/dL    Comment: Glucose reference range applies only to samples taken after fasting for at least 8 hours.   BUN 101 (H) 8 - 23 mg/dL   Creatinine, Ser 0.81 0.61 - 1.24 mg/dL   Calcium 8.1 (L) 8.9 - 10.3 mg/dL   GFR, Estimated >60 >60 mL/min    Comment: (NOTE) Calculated using the CKD-EPI Creatinine Equation (2021)     Anion gap 11 5 - 15    Comment: Performed at [facility] [address] [place], [place] [address]  CBC     Status: Abnormal   Collection Time: 07/12/20  9:30 AM  Result Value Ref Range   WBC 28.3 (H) 4.0 - 10.5 K/uL   RBC 3.47 (L) 4.22 - 5.81 MIL/uL   Hemoglobin 7.3 (L) 13.0 - 17.0 g/dL    Comment: REPEATED TO VERIFY Reticulocyte Hemoglobin testing may be clinically indicated, consider ordering this additional test [id number]    HCT 23.1 (L) 39.0 - 52.0 %   MCV 66.6 (L) 80.0 - 100.0 fL   MCH 21.0 (L) 26.0 - 34.0 pg   MCHC 31.6 30.0 - 36.0 g/dL   RDW 21.2 (H) 11.5 - 15.5 %   Platelets 599 (H) 150 - 400 K/uL    Comment: REPEATED TO VERIFY   nRBC 0.8 (H) 0.0 - 0.2 %    Comment: Performed at [facility], [place] [address], [place], [place] [address]  Magnesium     Status: None   Collection Time: 07/12/20  9:30 AM  Result Value Ref Range   Magnesium 2.1 1.7 - 2.4 mg/dL    Comment: Performed at [facility], [address], [place], [place] [address]  Procalcitonin     Status: None   Collection Time: 07/12/20  9:30 AM  Result Value Ref Range   Procalcitonin 10.47 ng/mL    Comment:        Interpretation: PCT >= 10 ng/mL: Important systemic inflammatory response, almost exclusively due to severe bacterial sepsis or septic shock. (NOTE)       Sepsis PCT Algorithm           Lower Respiratory Tract                                      Infection PCT Algorithm    ----------------------------     ----------------------------         PCT < 0.25 ng/mL                PCT < 0.10 ng/mL          Strongly encourage             Strongly discourage   discontinuation of antibiotics    initiation of antibiotics    ----------------------------     -----------------------------       PCT 0.25 - 0.50 ng/mL            PCT 0.10 - 0.25 ng/mL               OR       >80% decrease in PCT            Discourage initiation of                                            antibiotics       Encourage discontinuation           of antibiotics    ----------------------------     -----------------------------         PCT >= 0.50 ng/mL              PCT 0.26 - 0.50 ng/mL                AND       <80% decrease in PCT             Encourage initiation of                                             antibiotics       Encourage continuation           of antibiotics    ----------------------------     -----------------------------        PCT >= 0.50 ng/mL                  PCT > 0.50 ng/mL               AND         increase in PCT                  Strongly encourage  initiation of antibiotics    Strongly encourage escalation           of antibiotics                                     -----------------------------                                           PCT <= 0.25 ng/mL                                                 OR                                        > 80% decrease in PCT                                      Discontinue / Do not initiate                                             antibiotics  Performed at [facility], [address] [place], [place] [address]   Phosphorus     Status: None   Collection Time: 07/12/20  9:30 AM  Result Value Ref Range   Phosphorus 3.7 2.5 - 4.6 mg/dL    Comment: Performed at [facility] [address] [place], [place] [address]  Iron and TIBC     Status: Abnormal   Collection Time: 07/12/20  9:30 AM  Result Value Ref Range   Iron 9 (L) 45 - 182 ug/dL   TIBC 172 (L) 250 - 450 ug/dL   Saturation Ratios 5 (L) 17.9 - 39.5 %   UIBC 163 ug/dL    Comment: Performed at [facility], [place] [address], [place], [place] [address]  Ferritin     Status: None   Collection Time: 07/12/20  9:30 AM  Result Value Ref Range   Ferritin 243 24 - 336 ng/mL    Comment: Performed at [facility] [address] [place], [place] [address]  Glucose, capillary     Status: None   Collection  Time: 07/12/20 11:34 AM  Result Value Ref Range   Glucose-Capillary 86 70 - 99 mg/dL    Comment: Glucose reference range applies only to samples taken after fasting for at least 8 hours.  Glucose, capillary     Status: None   Collection Time: 07/12/20  3:12 PM  Result Value Ref Range   Glucose-Capillary 79 70 - 99 mg/dL    Comment: Glucose reference range applies only to samples taken after fasting for at least 8 hours.  Culture, Respiratory w Gram Stain     Status: None (Preliminary result)   Collection Time: 07/12/20  3:48 PM   Specimen: Tracheal Aspirate; Respiratory  Result Value Ref Range   Specimen Description TRACHEAL  ASPIRATE    Special Requests NONE    Gram Stain      MODERATE WBC PRESENT, PREDOMINANTLY PMN FEW GRAM NEGATIVE RODS FEW GRAM POSITIVE COCCI IN PAIRS Performed at [facility], [place] [address], [place], [place] [address]    Culture FEW ACINETOBACTER CALCOACETICUS/BAUMANNII COMPLEX    Report Status PENDING   Magnesium     Status: None   Collection Time: 07/12/20  4:47 PM  Result Value Ref Range   Magnesium 2.1 1.7 - 2.4 mg/dL    Comment: Performed at [facility], [place] [address] [place], [place] [address]  Phosphorus     Status: None   Collection Time: 07/12/20  4:47 PM  Result Value Ref Range   Phosphorus 3.7 2.5 - 4.6 mg/dL    Comment: Performed at [facility] [address] [place], [place] [address]  Glucose, capillary     Status: Abnormal   Collection Time: 07/12/20  7:07 PM  Result Value Ref Range   Glucose-Capillary 163 (H) 70 - 99 mg/dL    Comment: Glucose reference range applies only to samples taken after fasting for at least 8 hours.  Glucose, capillary     Status: Abnormal   Collection Time: 07/12/20 11:07 PM  Result Value Ref Range   Glucose-Capillary 176 (H) 70 - 99 mg/dL    Comment: Glucose reference range applies only to samples taken after fasting for at least 8 hours.  Glucose, capillary     Status: Abnormal    Collection Time: 07/13/20  3:03 AM  Result Value Ref Range   Glucose-Capillary 210 (H) 70 - 99 mg/dL    Comment: Glucose reference range applies only to samples taken after fasting for at least 8 hours.  Procalcitonin     Status: None   Collection Time: 07/13/20  5:55 AM  Result Value Ref Range   Procalcitonin 11.78 ng/mL    Comment:        Interpretation: PCT >= 10 ng/mL: Important systemic inflammatory response, almost exclusively due to severe bacterial sepsis or septic shock. (NOTE)       Sepsis PCT Algorithm           Lower Respiratory Tract                                      Infection PCT Algorithm    ----------------------------     ----------------------------         PCT < 0.25 ng/mL                PCT < 0.10 ng/mL          Strongly encourage             Strongly discourage   discontinuation of antibiotics    initiation of antibiotics    ----------------------------     -----------------------------       PCT 0.25 - 0.50 ng/mL            PCT 0.10 - 0.25 ng/mL               OR       >80% decrease in PCT            Discourage initiation of  antibiotics      Encourage discontinuation           of antibiotics    ----------------------------     -----------------------------         PCT >= 0.50 ng/mL              PCT 0.26 - 0.50 ng/mL                AND       <80% decrease in PCT             Encourage initiation of                                             antibiotics       Encourage continuation           of antibiotics    ----------------------------     -----------------------------        PCT >= 0.50 ng/mL                  PCT > 0.50 ng/mL               AND         increase in PCT                  Strongly encourage                                      initiation of antibiotics    Strongly encourage escalation           of antibiotics                                     -----------------------------                                            PCT <= 0.25 ng/mL                                                 OR                                        > 80% decrease in PCT                                      Discontinue / Do not initiate                                             antibiotics  Performed at [facility], [place] [address], [place], [place] [address]   Basic metabolic panel     Status: Abnormal  Collection Time: 07/13/20  5:55 AM  Result Value Ref Range   Sodium 134 (L) 135 - 145 mmol/L   Potassium 4.2 3.5 - 5.1 mmol/L   Chloride 98 98 - 111 mmol/L   CO2 26 22 - 32 mmol/L   Glucose, Bld 198 (H) 70 - 99 mg/dL    Comment: Glucose reference range applies only to samples taken after fasting for at least 8 hours.   BUN 112 (H) 8 - 23 mg/dL   Creatinine, Ser 0.86 0.61 - 1.24 mg/dL   Calcium 8.3 (L) 8.9 - 10.3 mg/dL   GFR, Estimated >60 >60 mL/min    Comment: (NOTE) Calculated using the CKD-EPI Creatinine Equation (2021)    Anion gap 10 5 - 15    Comment: Performed at [facility] [address], [place], [place] [address]  CBC     Status: Abnormal   Collection Time: 07/13/20  5:55 AM  Result Value Ref Range   WBC 29.3 (H) 4.0 - 10.5 K/uL   RBC 3.29 (L) 4.22 - 5.81 MIL/uL   Hemoglobin 7.1 (L) 13.0 - 17.0 g/dL    Comment: REPEATED TO VERIFY Reticulocyte Hemoglobin testing may be clinically indicated, consider ordering this additional test [id number]    HCT 21.3 (L) 39.0 - 52.0 %   MCV 64.7 (L) 80.0 - 100.0 fL   MCH 21.6 (L) 26.0 - 34.0 pg   MCHC 33.3 30.0 - 36.0 g/dL   RDW 20.9 (H) 11.5 - 15.5 %   Platelets 552 (H) 150 - 400 K/uL   nRBC 0.6 (H) 0.0 - 0.2 %    Comment: Performed at [facility], [place] [address] [place], [place] [address]  Magnesium     Status: None   Collection Time: 07/13/20  5:55 AM  Result Value Ref Range   Magnesium 2.2 1.7 - 2.4 mg/dL    Comment: Performed at [facility] [address], [place], [place] [address]  Phosphorus      Status: None   Collection Time: 07/13/20  5:55 AM  Result Value Ref Range   Phosphorus 4.4 2.5 - 4.6 mg/dL    Comment: Performed at [facility] [address], [place], [place] [address]  Glucose, capillary     Status: Abnormal   Collection Time: 07/13/20  7:14 AM  Result Value Ref Range   Glucose-Capillary 195 (H) 70 - 99 mg/dL    Comment: Glucose reference range applies only to samples taken after fasting for at least 8 hours.  Glucose, capillary     Status: Abnormal   Collection Time: 07/13/20 11:27 AM  Result Value Ref Range   Glucose-Capillary 203 (H) 70 - 99 mg/dL    Comment: Glucose reference range applies only to samples taken after fasting for at least 8 hours.  Vancomycin, trough     Status: Abnormal   Collection Time: 07/13/20 12:21 PM  Result Value Ref Range   Vancomycin Tr 34 (HH) 15 - 20 ug/mL    Comment: CRITICAL RESULT CALLED TO, READ BACK BY AND VERIFIED WITH: BEABRAUT,J RN @ [id number] 07/13/20 LEOANRD,A Performed at [facility], [place] [address] [place], [place] [address]   Glucose, capillary     Status: Abnormal   Collection Time: 07/13/20  3:16 PM  Result Value Ref Range   Glucose-Capillary 174 (H) 70 - 99 mg/dL    Comment: Glucose reference range applies only to samples taken after fasting for at least 8 hours.  Magnesium     Status: None   Collection Time: 07/13/20  4:56 PM  Result Value Ref Range   Magnesium 2.2 1.7 - 2.4 mg/dL    Comment: Performed at [facility], [place] [address] [place], [place] [address]  Phosphorus     Status: Abnormal   Collection Time: 07/13/20  4:56 PM  Result Value Ref Range   Phosphorus 4.8 (H) 2.5 - 4.6 mg/dL    Comment: Performed at [facility] [address], [place], [place] [address]  Glucose, capillary     Status: Abnormal   Collection Time: 07/13/20  7:49 PM  Result Value Ref Range   Glucose-Capillary 239 (H) 70 - 99 mg/dL    Comment: Glucose reference range applies only to samples taken after  fasting for at least 8 hours.  Glucose, capillary     Status: Abnormal   Collection Time: 07/13/20 11:31 PM  Result Value Ref Range   Glucose-Capillary 258 (H) 70 - 99 mg/dL    Comment: Glucose reference range applies only to samples taken after fasting for at least 8 hours.  Glucose, capillary     Status: Abnormal   Collection Time: 07/14/20  3:46 AM  Result Value Ref Range   Glucose-Capillary 256 (H) 70 - 99 mg/dL    Comment: Glucose reference range applies only to samples taken after fasting for at least 8 hours.  Magnesium     Status: None   Collection Time: 07/14/20  5:57 AM  Result Value Ref Range   Magnesium 2.1 1.7 - 2.4 mg/dL    Comment: Performed at [facility] [address], [place], [place] [address]  Phosphorus     Status: Abnormal   Collection Time: 07/14/20  5:57 AM  Result Value Ref Range   Phosphorus 5.5 (H) 2.5 - 4.6 mg/dL    Comment: Performed at [facility] [address], [place], [place] [address]  Basic metabolic panel     Status: Abnormal   Collection Time: 07/14/20  5:57 AM  Result Value Ref Range   Sodium 137 135 - 145 mmol/L   Potassium 5.6 (H) 3.5 - 5.1 mmol/L    Comment: HEMOLYSIS AT THIS LEVEL MAY AFFECT RESULT   Chloride 103 98 - 111 mmol/L   CO2 26 22 - 32 mmol/L   Glucose, Bld 256 (H) 70 - 99 mg/dL    Comment: Glucose reference range applies only to samples taken after fasting for at least 8 hours.   BUN 132 (H) 8 - 23 mg/dL   Creatinine, Ser 1.15 0.61 - 1.24 mg/dL   Calcium 7.5 (L) 8.9 - 10.3 mg/dL   GFR, Estimated >60 >60 mL/min    Comment: (NOTE) Calculated using the CKD-EPI Creatinine Equation (2021)    Anion gap 8 5 - 15    Comment: Performed at [facility] [address] [place], [place] [address]  Glucose, capillary     Status: Abnormal   Collection Time: 07/14/20  7:35 AM  Result Value Ref Range   Glucose-Capillary 214 (H) 70 - 99 mg/dL    Comment: Glucose reference range applies only to samples taken after  fasting for at least 8 hours.      Component Value Date/Time   SDES TRACHEAL ASPIRATE 07/12/2020 1548   SPECREQUEST NONE 07/12/2020 1548   CULT FEW ACINETOBACTER CALCOACETICUS/BAUMANNII COMPLEX 07/12/2020 1548   REPTSTATUS PENDING 07/12/2020 1548   DG CHEST PORT 1 VIEW  Result Date:  07/14/2020 CLINICAL DATA:  Respiratory failure. EXAM: PORTABLE CHEST 1 VIEW COMPARISON:  July 12, 2020. FINDINGS: Stable cardiomediastinal silhouette. Tracheostomy tube is in good position. Stable bilateral lung opacities are noted concerning for edema or pneumonia. Stable bilateral pleural effusions. No definite pneumothorax. Bony thorax is unremarkable. IMPRESSION: Stable bilateral lung opacities described above. Stable bilateral pleural effusions. Electronically Signed   By: Marijo Conception M.D.   On: 07/14/2020 08:10   Recent Results (from the past 240 hour(s))  Urine culture     Status: Abnormal   Collection Time: 07/10/2020  6:24 PM   Specimen: Urine, Random  Result Value Ref Range Status   Specimen Description URINE, RANDOM  Final   Special Requests   Final    NONE Performed at [facility], [address] [place], [place] [address]    Culture MULTIPLE SPECIES PRESENT, SUGGEST RECOLLECTION (A)  Final   Report Status 07/13/2020 FINAL  Final  Culture, blood (routine x 2)     Status: None (Preliminary result)   Collection Time: 07/01/2020  6:42 PM   Specimen: BLOOD LEFT FOREARM  Result Value Ref Range Status   Specimen Description BLOOD LEFT FOREARM  Final   Special Requests   Final    BOTTLES DRAWN AEROBIC AND ANAEROBIC Blood Culture results may not be optimal due to an inadequate volume of blood received in culture bottles   Culture  Setup Time   Final    ANAEROBIC BOTTLE ONLY GRAM POSITIVE COCCI CRITICAL RESULT CALLED TO, READ BACK BY AND VERIFIED WITH: [place] Brick [facility] 07/13/20 0207 JDW Performed at [facility], [place] [address], [place], [place] [address]    Culture GRAM POSITIVE COCCI   Final   Report Status PENDING  Incomplete  Blood Culture ID Panel (Reflexed)     Status: Abnormal   Collection Time: 07/03/2020  6:42 PM  Result Value Ref Range Status   Enterococcus faecalis NOT DETECTED NOT DETECTED Final   Enterococcus Faecium NOT DETECTED NOT DETECTED Final   Listeria monocytogenes NOT DETECTED NOT DETECTED Final   Staphylococcus species DETECTED (A) NOT DETECTED Final    Comment: CRITICAL RESULT CALLED TO, READ BACK BY AND VERIFIED WITH: G ABBOTT PHARMD 07/13/20 0207 JDW    Staphylococcus aureus (BCID) NOT DETECTED NOT DETECTED Final   Staphylococcus epidermidis NOT DETECTED NOT DETECTED Final   Staphylococcus lugdunensis NOT DETECTED NOT DETECTED Final   Streptococcus species NOT DETECTED NOT DETECTED Final   Streptococcus agalactiae NOT DETECTED NOT DETECTED Final   Streptococcus pneumoniae NOT DETECTED NOT DETECTED Final   Streptococcus pyogenes NOT DETECTED NOT DETECTED Final   A.calcoaceticus-baumannii NOT DETECTED NOT DETECTED Final   Bacteroides fragilis NOT DETECTED NOT DETECTED Final   Enterobacterales NOT DETECTED NOT DETECTED Final   Enterobacter cloacae complex NOT DETECTED NOT DETECTED Final   Escherichia coli NOT DETECTED NOT DETECTED Final   Klebsiella aerogenes NOT DETECTED NOT DETECTED Final   Klebsiella oxytoca NOT DETECTED NOT DETECTED Final   Klebsiella pneumoniae NOT DETECTED NOT DETECTED Final   Proteus species NOT DETECTED NOT DETECTED Final   Salmonella species NOT DETECTED NOT DETECTED Final   Serratia marcescens NOT DETECTED NOT DETECTED Final   Haemophilus influenzae NOT DETECTED NOT DETECTED Final   Neisseria meningitidis NOT DETECTED NOT DETECTED Final   Pseudomonas aeruginosa NOT DETECTED NOT DETECTED Final   Stenotrophomonas maltophilia NOT DETECTED NOT DETECTED Final   Candida albicans NOT DETECTED NOT DETECTED Final   Candida auris NOT DETECTED NOT  DETECTED Final   Candida glabrata NOT DETECTED NOT DETECTED Final   Candida krusei  NOT DETECTED NOT DETECTED Final   Candida parapsilosis NOT DETECTED NOT DETECTED Final   Candida tropicalis NOT DETECTED NOT DETECTED Final   Cryptococcus neoformans/gattii NOT DETECTED NOT DETECTED Final    Comment: Performed at [facility], [place] [address], [place], [place] [address]  Resp Panel by RT-PCR (Flu A&B, Covid) Nasopharyngeal Swab     Status: None   Collection Time: 06/26/2020  7:22 PM   Specimen: Nasopharyngeal Swab; Nasopharyngeal(NP) swabs in vial transport medium  Result Value Ref Range Status   SARS Coronavirus 2 by RT PCR NEGATIVE NEGATIVE Final    Comment: (NOTE) SARS-CoV-2 target nucleic acids are NOT DETECTED.  The SARS-CoV-2 RNA is generally detectable in upper respiratory specimens during the acute phase of infection. The lowest concentration of SARS-CoV-2 viral copies this assay can detect is 138 copies/mL. A negative result does not preclude SARS-Cov-2 infection and should not be used as the sole basis for treatment or other patient management decisions. A negative result may occur with  improper specimen collection/handling, submission of specimen other than nasopharyngeal swab, presence of viral mutation(s) within the areas targeted by this assay, and inadequate number of viral copies(<138 copies/mL). A negative result must be combined with clinical observations, patient history, and epidemiological information. The expected result is Negative.  Fact Sheet for Patients:  EntrepreneurPulse.com.au  Fact Sheet for Healthcare Providers:  IncredibleEmployment.be  This test is no t yet approved or cleared by the [place] FDA and  has been authorized for detection and/or diagnosis of SARS-CoV-2 by FDA under an Emergency Use Authorization (EUA). This EUA will remain  in effect (meaning this test can be used) for the duration of the COVID-19 declaration under Section 564(b)(1) of the Act, 21 U.S.C.section  360bbb-3(b)(1), unless the authorization is terminated  or revoked sooner.       Influenza A by PCR NEGATIVE NEGATIVE Final   Influenza B by PCR NEGATIVE NEGATIVE Final    Comment: (NOTE) The Xpert Xpress SARS-CoV-2/FLU/RSV plus assay is intended as an aid in the diagnosis of influenza from Nasopharyngeal swab specimens and should not be used as a sole basis for treatment. Nasal washings and aspirates are unacceptable for Xpert Xpress SARS-CoV-2/FLU/RSV testing.  Fact Sheet for Patients: EntrepreneurPulse.com.au  Fact Sheet for Healthcare Providers: IncredibleEmployment.be  This test is not yet approved or cleared by the [place] FDA and has been authorized for detection and/or diagnosis of SARS-CoV-2 by FDA under an Emergency Use Authorization (EUA). This EUA will remain in effect (meaning this test can be used) for the duration of the COVID-19 declaration under Section 564(b)(1) of the Act, 21 U.S.C. section 360bbb-3(b)(1), unless the authorization is terminated or revoked.  Performed at [facility], [place] [address] [place], [place] [address]   Culture, blood (routine x 2)     Status: None (Preliminary result)   Collection Time: 07/10/2020  7:23 PM   Specimen: BLOOD  Result Value Ref Range Status   Specimen Description BLOOD RIGHT ANTECUBITAL  Final   Special Requests   Final    BOTTLES DRAWN AEROBIC AND ANAEROBIC Blood Culture adequate volume   Culture   Final    NO GROWTH 2 DAYS Performed at [facility], [place] [address], [place], [place] [address]    Report Status PENDING  Incomplete  MRSA Next Gen by PCR, Nasal     Status: Abnormal  Collection Time: 06/14/2020 10:29 PM   Specimen: Nasal Mucosa; Nasal Swab  Result Value Ref Range Status   MRSA by PCR Next Gen DETECTED (A) NOT DETECTED Final    Comment: RESULT CALLED TO, READ BACK BY AND VERIFIED WITH: Ayesha Rumpf RN 07/12/20 0428 JDW (NOTE) The GeneXpert MRSA Assay  (FDA approved for NASAL specimens only), is one component of a comprehensive MRSA colonization surveillance program. It is not intended to diagnose MRSA infection nor to guide or monitor treatment for MRSA infections. Test performance is not FDA approved in patients less than 35 years old. Performed at [facility], [place] [address], [place], [place] [address]   Culture, Respiratory w Gram Stain     Status: None (Preliminary result)   Collection Time: 07/12/20  3:48 PM   Specimen: Tracheal Aspirate; Respiratory  Result Value Ref Range Status   Specimen Description TRACHEAL ASPIRATE  Final   Special Requests NONE  Final   Gram Stain   Final    MODERATE WBC PRESENT, PREDOMINANTLY PMN FEW GRAM NEGATIVE RODS FEW GRAM POSITIVE COCCI IN PAIRS Performed at [facility], [address] [place], [place] [address]    Culture FEW ACINETOBACTER CALCOACETICUS/BAUMANNII COMPLEX  Final   Report Status PENDING  Incomplete    Microbiology: Recent Results (from the past 240 hour(s))  Urine culture     Status: Abnormal   Collection Time: 06/26/2020  6:24 PM   Specimen: Urine, Random  Result Value Ref Range Status   Specimen Description URINE, RANDOM  Final   Special Requests   Final    NONE Performed at [facility], [place] [address], [place], [place] [address]    Culture MULTIPLE SPECIES PRESENT, SUGGEST RECOLLECTION (A)  Final   Report Status 07/13/2020 FINAL  Final  Culture, blood (routine x 2)     Status: None (Preliminary result)   Collection Time: 07/05/2020  6:42 PM   Specimen: BLOOD LEFT FOREARM  Result Value Ref Range Status   Specimen Description BLOOD LEFT FOREARM  Final   Special Requests   Final    BOTTLES DRAWN AEROBIC AND ANAEROBIC Blood Culture results may not be optimal due to an inadequate volume of blood received in culture bottles   Culture  Setup Time   Final    ANAEROBIC BOTTLE ONLY GRAM POSITIVE COCCI CRITICAL RESULT CALLED TO, READ BACK BY AND VERIFIED  WITH: Denton Brick [facility] 07/13/20 0207 JDW Performed at [facility], [place] [address], [place], [place] [address]    Culture GRAM POSITIVE COCCI  Final   Report Status PENDING  Incomplete  Blood Culture ID Panel (Reflexed)     Status: Abnormal   Collection Time: 06/28/2020  6:42 PM  Result Value Ref Range Status   Enterococcus faecalis NOT DETECTED NOT DETECTED Final   Enterococcus Faecium NOT DETECTED NOT DETECTED Final   Listeria monocytogenes NOT DETECTED NOT DETECTED Final   Staphylococcus species DETECTED (A) NOT DETECTED Final    Comment: CRITICAL RESULT CALLED TO, READ BACK BY AND VERIFIED WITH: G ABBOTT PHARMD 07/13/20 0207 JDW    Staphylococcus aureus (BCID) NOT DETECTED NOT DETECTED Final   Staphylococcus epidermidis NOT DETECTED NOT DETECTED Final   Staphylococcus lugdunensis NOT DETECTED NOT DETECTED Final   Streptococcus species NOT DETECTED NOT DETECTED Final   Streptococcus agalactiae NOT DETECTED NOT DETECTED Final   Streptococcus pneumoniae NOT DETECTED NOT DETECTED Final   Streptococcus pyogenes NOT DETECTED NOT DETECTED Final   A.calcoaceticus-baumannii  NOT DETECTED NOT DETECTED Final   Bacteroides fragilis NOT DETECTED NOT DETECTED Final   Enterobacterales NOT DETECTED NOT DETECTED Final   Enterobacter cloacae complex NOT DETECTED NOT DETECTED Final   Escherichia coli NOT DETECTED NOT DETECTED Final   Klebsiella aerogenes NOT DETECTED NOT DETECTED Final   Klebsiella oxytoca NOT DETECTED NOT DETECTED Final   Klebsiella pneumoniae NOT DETECTED NOT DETECTED Final   Proteus species NOT DETECTED NOT DETECTED Final   Salmonella species NOT DETECTED NOT DETECTED Final   Serratia marcescens NOT DETECTED NOT DETECTED Final   Haemophilus influenzae NOT DETECTED NOT DETECTED Final   Neisseria meningitidis NOT DETECTED NOT DETECTED Final   Pseudomonas aeruginosa NOT DETECTED NOT DETECTED Final   Stenotrophomonas maltophilia NOT DETECTED NOT DETECTED Final   Candida albicans  NOT DETECTED NOT DETECTED Final   Candida auris NOT DETECTED NOT DETECTED Final   Candida glabrata NOT DETECTED NOT DETECTED Final   Candida krusei NOT DETECTED NOT DETECTED Final   Candida parapsilosis NOT DETECTED NOT DETECTED Final   Candida tropicalis NOT DETECTED NOT DETECTED Final   Cryptococcus neoformans/gattii NOT DETECTED NOT DETECTED Final    Comment: Performed at [facility], [place] [address] [place], [place] [address]  Resp Panel by RT-PCR (Flu A&B, Covid) Nasopharyngeal Swab     Status: None   Collection Time: 06/18/2020  7:22 PM   Specimen: Nasopharyngeal Swab; Nasopharyngeal(NP) swabs in vial transport medium  Result Value Ref Range Status   SARS Coronavirus 2 by RT PCR NEGATIVE NEGATIVE Final    Comment: (NOTE) SARS-CoV-2 target nucleic acids are NOT DETECTED.  The SARS-CoV-2 RNA is generally detectable in upper respiratory specimens during the acute phase of infection. The lowest concentration of SARS-CoV-2 viral copies this assay can detect is 138 copies/mL. A negative result does not preclude SARS-Cov-2 infection and should not be used as the sole basis for treatment or other patient management decisions. A negative result may occur with  improper specimen collection/handling, submission of specimen other than nasopharyngeal swab, presence of viral mutation(s) within the areas targeted by this assay, and inadequate number of viral copies(<138 copies/mL). A negative result must be combined with clinical observations, patient history, and epidemiological information. The expected result is Negative.  Fact Sheet for Patients:  EntrepreneurPulse.com.au  Fact Sheet for Healthcare Providers:  IncredibleEmployment.be  This test is no t yet approved or cleared by the [place] FDA and  has been authorized for detection and/or diagnosis of SARS-CoV-2 by FDA under an Emergency Use Authorization (EUA). This EUA will remain  in  effect (meaning this test can be used) for the duration of the COVID-19 declaration under Section 564(b)(1) of the Act, 21 U.S.C.section 360bbb-3(b)(1), unless the authorization is terminated  or revoked sooner.       Influenza A by PCR NEGATIVE NEGATIVE Final   Influenza B by PCR NEGATIVE NEGATIVE Final    Comment: (NOTE) The Xpert Xpress SARS-CoV-2/FLU/RSV plus assay is intended as an aid in the diagnosis of influenza from Nasopharyngeal swab specimens and should not be used as a sole basis for treatment. Nasal washings and aspirates are unacceptable for Xpert Xpress SARS-CoV-2/FLU/RSV testing.  Fact Sheet for Patients: EntrepreneurPulse.com.au  Fact Sheet for Healthcare Providers: IncredibleEmployment.be  This test is not yet approved or cleared by the [place] FDA and has been authorized for detection and/or diagnosis of SARS-CoV-2 by FDA under an Emergency Use Authorization (EUA). This EUA will remain in effect (meaning this test can be used) for  the duration of the COVID-19 declaration under Section 564(b)(1) of the Act, 21 U.S.C. section 360bbb-3(b)(1), unless the authorization is terminated or revoked.  Performed at [facility], [place] [address] [place], [place] [address]   Culture, blood (routine x 2)     Status: None (Preliminary result)   Collection Time: 06/18/2020  7:23 PM   Specimen: BLOOD  Result Value Ref Range Status   Specimen Description BLOOD RIGHT ANTECUBITAL  Final   Special Requests   Final    BOTTLES DRAWN AEROBIC AND ANAEROBIC Blood Culture adequate volume   Culture   Final    NO GROWTH 2 DAYS Performed at [facility], [place] [address] [place] [address], [place], [place] [address]    Report Status PENDING  Incomplete  MRSA Next Gen by PCR, Nasal     Status: Abnormal   Collection Time: 06/20/2020 10:29 PM   Specimen: Nasal Mucosa; Nasal Swab  Result Value Ref Range Status   MRSA by PCR Next Gen DETECTED (A) NOT  DETECTED Final    Comment: RESULT CALLED TO, READ BACK BY AND VERIFIED WITH: Ayesha Rumpf RN 07/12/20 0428 JDW (NOTE) The GeneXpert MRSA Assay (FDA approved for NASAL specimens only), is one component of a comprehensive MRSA colonization surveillance program. It is not intended to diagnose MRSA infection nor to guide or monitor treatment for MRSA infections. Test performance is not FDA approved in patients less than 73 years old. Performed at [facility], [place] [address], [place], [place] [address]   Culture, Respiratory w Gram Stain     Status: None (Preliminary result)   Collection Time: 07/12/20  3:48 PM   Specimen: Tracheal Aspirate; Respiratory  Result Value Ref Range Status   Specimen Description TRACHEAL ASPIRATE  Final   Special Requests NONE  Final   Gram Stain   Final    MODERATE WBC PRESENT, PREDOMINANTLY PMN FEW GRAM NEGATIVE RODS FEW GRAM POSITIVE COCCI IN PAIRS Performed at [facility], [address] [place],  [address]    Culture FEW ACINETOBACTER CALCOACETICUS/BAUMANNII COMPLEX  Final   Report Status PENDING  Incomplete    Radiographs and labs were personally reviewed by me.   Bobby Rumpf, MD [facility] [phone] 07/14/2020, 8:37 AM

## 2020-07-15 ENCOUNTER — Inpatient Hospital Stay (HOSPITAL_COMMUNITY): Payer: 59

## 2020-07-15 DIAGNOSIS — J9601 Acute respiratory failure with hypoxia: Secondary | ICD-10-CM | POA: Diagnosis not present

## 2020-07-15 DIAGNOSIS — J189 Pneumonia, unspecified organism: Secondary | ICD-10-CM

## 2020-07-15 DIAGNOSIS — J158 Pneumonia due to other specified bacteria: Secondary | ICD-10-CM | POA: Diagnosis not present

## 2020-07-15 DIAGNOSIS — A419 Sepsis, unspecified organism: Secondary | ICD-10-CM | POA: Diagnosis not present

## 2020-07-15 DIAGNOSIS — R6521 Severe sepsis with septic shock: Secondary | ICD-10-CM | POA: Diagnosis not present

## 2020-07-15 LAB — CBC
HCT: 25.6 % — ABNORMAL LOW (ref 39.0–52.0)
Hemoglobin: 8.4 g/dL — ABNORMAL LOW (ref 13.0–17.0)
MCH: 22.3 pg — ABNORMAL LOW (ref 26.0–34.0)
MCHC: 32.8 g/dL (ref 30.0–36.0)
MCV: 68.1 fL — ABNORMAL LOW (ref 80.0–100.0)
Platelets: 569 10*3/uL — ABNORMAL HIGH (ref 150–400)
RBC: 3.76 MIL/uL — ABNORMAL LOW (ref 4.22–5.81)
RDW: 24.1 % — ABNORMAL HIGH (ref 11.5–15.5)
WBC: 35.4 10*3/uL — ABNORMAL HIGH (ref 4.0–10.5)
nRBC: 0.6 % — ABNORMAL HIGH (ref 0.0–0.2)

## 2020-07-15 LAB — GLUCOSE, CAPILLARY
Glucose-Capillary: 219 mg/dL — ABNORMAL HIGH (ref 70–99)
Glucose-Capillary: 203 mg/dL — ABNORMAL HIGH (ref 70–99)
Glucose-Capillary: 213 mg/dL — ABNORMAL HIGH (ref 70–99)
Glucose-Capillary: 198 mg/dL — ABNORMAL HIGH (ref 70–99)
Glucose-Capillary: 191 mg/dL — ABNORMAL HIGH (ref 70–99)
Glucose-Capillary: 183 mg/dL — ABNORMAL HIGH (ref 70–99)

## 2020-07-15 LAB — BASIC METABOLIC PANEL
Anion gap: 12 (ref 5–15)
BUN: 132 mg/dL — ABNORMAL HIGH (ref 8–23)
CO2: 23 mmol/L (ref 22–32)
Calcium: 8.3 mg/dL — ABNORMAL LOW (ref 8.9–10.3)
Chloride: 103 mmol/L (ref 98–111)
Creatinine, Ser: 0.89 mg/dL (ref 0.61–1.24)
GFR, Estimated: >60 mL/min (ref >60)
Glucose, Bld: 200 mg/dL — ABNORMAL HIGH (ref 70–99)
Potassium: 5 mmol/L (ref 3.5–5.1)
Sodium: 138 mmol/L (ref 135–145)

## 2020-07-15 LAB — TSH: TSH: 0.922 u[IU]/mL (ref 0.350–4.500)

## 2020-07-15 MED ORDER — INSULIN GLARGINE 100 UNIT/ML ~~LOC~~ SOLN
8.0000 [IU] | Freq: Two times a day (BID) | SUBCUTANEOUS | Status: DC
  Administered 2020-07-15 – 2020-07-19 (×9): 8 [IU] via SUBCUTANEOUS
  Filled 2020-07-15 (×11): qty 0.08

## 2020-07-15 NOTE — Progress Notes (Signed)
Elink facilitating virtual visit for family

## 2020-07-15 NOTE — Progress Notes (Addendum)
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 Most recent admission September 2021 in setting of sepsis due to MDR Klebsiella with right ureteral calculus s/p double J ureteral stent placement and treated with meropenem x 10days. He was discharged back to [facility].  6/29 patient presented to [facility] via EMS for AMS and hypotension. Per reports, patient is Aox4 at baseline but had developed AMS and had low-grade fevers with hypotension. This is on the background of undergoing pneumonia treatment for approximately one month in [facility] as well as recent worsening of hypertension causing an increase in his routine medications. Upon EMS arrival to [facility], BP 80/50 which did not improve with 1L crystalloid bolus and Levo started en route, requiring uptitration in ED with improvement to 120/82. He was also found to be hypoxemic requiring high percent FiO2 on ventilator. Thick secretions in ET tube suction and cloudy urine noted by ED team. PCCM asked to admit.   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  Anemia  DM II  HTN  Urinary Retention Renal Calculi  Frequent PNA's Chronic [facility] Resident   Significant Hospital Events: Including procedures, antibiotic start and stop dates in addition to other pertinent events   6/29 admitted to ICU for septic shock on levo to maintain MAP>65, continued on vent support   6/29 Blood Cx>> staph species in 1 of 4 cultures drawn>> suspect contaminant 6/29 respiratory Cx>> GS>> Few gram + rods/ Few gram + cocci in pairs, Cx pending 6/29 Urine Cx>> Multiple species>> Repeat collection 6/30 Levophed off at 2130, BP stable 7/2 ID Consult for MDR acinetobacter in trach asp  Interim History / Subjective:   Remains on the ventilator, unresponsive Off pressors  Objective   Blood pressure 127/61, pulse 95, temperature 99 F (37.2 C), temperature source Oral, resp. rate (!) 30, height '5\' 6"'$  (1.676 m), weight 91.3 kg, SpO2 93 %.    Vent Mode: PRVC FiO2 (%):  [60 %-80 %] 80 % Set Rate:  [26 bmp] 26 bmp Vt Set:  [450 mL] 450 mL PEEP:  [10 cmH20] 10 cmH20 Plateau Pressure:  [32 cmH20-44 cmH20] 37 cmH20   Intake/Output Summary (Last 24 hours) at 07/15/2020 0948 Last data filed at 07/15/2020 0936 Gross per 24 hour  Intake 2821.61 ml  Output 1785 ml  Net 1036.61 ml   Filed Weights   07/13/20 0427 07/14/20 0347 07/15/20 0500  Weight: 90.8 kg 91.8 kg 91.3 kg    Examination: Gen:      No acute distress, chronically ill-appearing HEENT:  EOMI, sclera anicteric Neck:     No masses; no thyromegaly, ET tube Lungs:    Clear to auscultation bilaterally; normal respiratory effort CV:         Regular rate and rhythm; no murmurs Abd:      + bowel sounds; soft, non-tender; no palpable masses, no distension Ext:    No edema; adequate peripheral perfusion Skin:      Warm and dry; no rash  Neuro: [facility] Problem list   Hyperkalemia  Assessment & Plan:   Septic Shock (POA) in setting of HCAP PNA and UTI, present on admission Hx of MDR [place], MRSA and Pseudomonas. Hx of pneumonia for which he was being treated at [facility]. Procal remains elevated >5. UA consistent with UTI. Significant leukocytosis on labs but remains afebrile. Sacral ulcer does not appear to be infected at this time. Currently on broad spectrum antibiotics.  CXR with multifocal  infiltrates (PNA vs edema)  Appreciate ID recommendation.  Taken off vancomycin as blood cultures thought to be contaminant Meropenem changed to Unasyn to cover Acinetobacter Off pressors, continue midodrine  Acute on chronic hypoxemic respiratory failure (POA) Acinetobacter in Tracheal Aspirate Likely 2/2 PNA vs hypervolemia as patient has significant pitting edema in bilat lower extremities (no history of HF so likely 2/2 third spacing from hypoalbuminemia). CXR with stable multifocal pneumonia and RUL collapse with bilateral pleural effusions. 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  Continue vent, wean PEEP/FiO2 Follow intermittent chest x-ray Mucomyst, nebs, guaifenesin for mucociliary clearance Chest PT  Acute metabolic encephalopathy (POA) 2/2 acidosis vs uremia  Respiratory and metabolic acidosis; slightly improved. However, remains mostly unresponsive to verbal or physical stimuli.  Monitor  Anemia of chronic illness No evidence of active bleed S/p PRBC transfusion 7/2 Iron replacement  Severe protein calorie malnutrition (POA) Continue tube feeds  AKI secondary to ATN in the setting of sepsis Hyperkalemia Lokelma as needed Follow urine output and creatinine  Thrombocytosis Likely reactive in setting of acute infection as above.  Follow CBC  DM II On Basal insulin at [facility] Increase lantus to 8 units bid  PAF Currently NSR.  Continue amiodarone, Xarelto  Periorbital Edema / Mild Exophthalmus  TSH is normal  Sacral decubitus ulcer stage IV (POA) Wound care  Best Practice (right click and "Reselect all SmartList Selections" daily)   Diet/type: tubefeeds DVT prophylaxis: DOAC GI prophylaxis: PPI Lines: N/A Foley:  Yes, and it is still needed Code Status:  full code  Critical care time:     The patient is critically ill with multiple organ system failure and requires high complexity decision making for assessment and support,  frequent evaluation and titration of therapies, advanced monitoring, review of radiographic studies and interpretation of complex data.   Critical Care Time devoted to patient care services, exclusive of separately billable procedures, described in this note is 45  minutes.   Marshell Garfinkel MD [facility] See Amion for pager  If no response to pager , please call [phone] until 7pm After 7:00 pm call Elink  [phone] 07/15/2020, 9:54 AM

## 2020-07-15 NOTE — Progress Notes (Addendum)
INFECTIOUS DISEASE PROGRESS NOTE  ID: Luis Orr is a 70 y.o. male with  Active Problems:   Septic shock ([place])   Pressure injury of skin  Subjective: No response, off pressors.   Abtx:  Anti-infectives (From admission, onward)    Start     Dose/Rate Route Frequency Ordered Stop   07/14/20 1530  Ampicillin-Sulbactam (UNASYN) 3 g in sodium chloride 0.9 % 100 mL IVPB        3 g 200 mL/hr over 30 Minutes Intravenous Every 6 hours 07/14/20 1440     07/13/20 2000  meropenem (MERREM) 2 g in sodium chloride 0.9 % 100 mL IVPB  Status:  Discontinued        2 g 200 mL/hr over 30 Minutes Intravenous Every 12 hours 07/13/20 1433 07/14/20 1440   07/13/20 1432  vancomycin variable dose per unstable renal function (pharmacist dosing)         Does not apply See admin instructions 07/13/20 1433     07/13/20 1400  vancomycin (VANCOCIN) IVPB 750 mg/150 ml premix  Status:  Discontinued        750 mg 150 mL/hr over 60 Minutes Intravenous Every 12 hours 07/13/20 1321 07/13/20 1430   07/12/20 1248  vancomycin (VANCOREADY) IVPB 750 mg/150 mL  Status:  Discontinued        750 mg 150 mL/hr over 60 Minutes Intravenous Every 12 hours 07/12/20 1248 07/13/20 1321   07/04/2020 2300  meropenem (MERREM) 2 g in sodium chloride 0.9 % 100 mL IVPB  Status:  Discontinued        2 g 200 mL/hr over 30 Minutes Intravenous Every 8 hours 07/05/2020 2209 07/13/20 1433   06/29/2020 2205  vancomycin variable dose per unstable renal function (pharmacist dosing)  Status:  Discontinued         Does not apply See admin instructions 06/22/2020 2209 07/12/20 1248   07/10/2020 1845  vancomycin (VANCOREADY) IVPB 1500 mg/300 mL        1,500 mg 150 mL/hr over 120 Minutes Intravenous  Once 07/02/2020 1843 06/21/2020 2132   06/25/2020 1845  ceFEPIme (MAXIPIME) 2 g in sodium chloride 0.9 % 100 mL IVPB        2 g 200 mL/hr over 30 Minutes Intravenous  Once 06/18/2020 1843 06/17/2020 1952       Medications: Scheduled:  acetylcysteine  4 mL  Nebulization BID   albuterol  2.5 mg Nebulization BID   amiodarone  200 mg Per Tube Daily   chlorhexidine gluconate (MEDLINE KIT)  15 mL Mouth Rinse BID   Chlorhexidine Gluconate Cloth  6 each Topical Q1200   docusate  100 mg Per Tube BID   feeding supplement (PROSource TF)  45 mL Per Tube QID   ferrous sulfate  220 mg Per Tube Q breakfast   guaiFENesin  5 mL Per Tube BID   insulin aspart  0-15 Units Subcutaneous Q4H   insulin glargine  10 Units Subcutaneous Daily   mouth rinse  15 mL Mouth Rinse 10 times per day   midodrine  5 mg Per Tube Q8H   multivitamin with minerals  1 tablet Per Tube Daily   mupirocin ointment  1 application Nasal BID   pantoprazole (PROTONIX) IV  40 mg Intravenous QHS   polyethylene glycol  17 g Per Tube Daily   rivaroxaban  20 mg Per Tube Daily   sodium chloride flush  10-40 mL Intracatheter Q12H   vancomycin variable dose per unstable renal function (pharmacist  dosing)   Does not apply See admin instructions    Objective: Vital signs in last 24 hours: Temp:  [98.1 F (36.7 C)-100.3 F (37.9 C)] 99 F (37.2 C) (07/03 0728) Pulse Rate:  [86-97] 92 (07/03 0700) Resp:  [23-31] 27 (07/03 0700) BP: (102-146)/(48-80) 105/58 (07/03 0700) SpO2:  [67 %-97 %] 91 % (07/03 0700) FiO2 (%):  [50 %-80 %] 80 % (07/03 0400) Weight:  [91.3 kg] 91.3 kg (07/03 0500)   General appearance: no distress Resp: diminished breath sounds anterior - bilateral Cardio: regularly irregular rhythm GI: normal findings: soft, non-tender and abnormal findings:  distended and hypoactive bowel sounds Extremities: anasarca  Lab Results Recent Labs    07/14/20 0557 07/14/20 1018 07/14/20 1830 07/15/20 0120  WBC  --  28.9*  --  35.4*  HGB  --  6.8* 8.4* 8.4*  HCT  --  20.1* 25.2* 25.6*  NA 137  --   --  138  K 5.6*  --   --  5.0  CL 103  --   --  103  CO2 26  --   --  23  BUN 132*  --   --  132*  CREATININE 1.15  --   --  0.89   Liver Panel No results for input(s): PROT,  ALBUMIN, AST, ALT, ALKPHOS, BILITOT, BILIDIR, IBILI in the last 72 hours. Sedimentation Rate No results for input(s): ESRSEDRATE in the last 72 hours. C-Reactive Protein No results for input(s): CRP in the last 72 hours.  Microbiology: Recent Results (from the past 240 hour(s))  Urine culture     Status: Abnormal   Collection Time: 07/12/2020  6:24 PM   Specimen: Urine, Random  Result Value Ref Range Status   Specimen Description URINE, RANDOM  Final   Special Requests   Final    NONE Performed at [facility], [address] [place], [place] [address]    Culture MULTIPLE SPECIES PRESENT, SUGGEST RECOLLECTION (A)  Final   Report Status 07/13/2020 FINAL  Final  Culture, blood (routine x 2)     Status: Abnormal   Collection Time: 06/17/2020  6:42 PM   Specimen: BLOOD LEFT FOREARM  Result Value Ref Range Status   Specimen Description BLOOD LEFT FOREARM  Final   Special Requests   Final    BOTTLES DRAWN AEROBIC AND ANAEROBIC Blood Culture results may not be optimal due to an inadequate volume of blood received in culture bottles   Culture  Setup Time   Final    ANAEROBIC BOTTLE ONLY GRAM POSITIVE COCCI CRITICAL RESULT CALLED TO, READ BACK BY AND VERIFIED WITH: G ABBOTT PHARMD 07/13/20 0207 JDW    Culture (A)  Final    STAPHYLOCOCCUS AURICULARIS THE SIGNIFICANCE OF ISOLATING THIS ORGANISM FROM A SINGLE SET OF BLOOD CULTURES WHEN MULTIPLE SETS ARE DRAWN IS UNCERTAIN. PLEASE NOTIFY THE MICROBIOLOGY DEPARTMENT WITHIN ONE WEEK IF SPECIATION AND SENSITIVITIES ARE REQUIRED. Performed at [facility], [place] [address] [place], [place] [address]    Report Status 07/14/2020 FINAL  Final  Blood Culture ID Panel (Reflexed)     Status: Abnormal   Collection Time: 06/14/2020  6:42 PM  Result Value Ref Range Status   Enterococcus faecalis NOT DETECTED NOT DETECTED Final   Enterococcus Faecium NOT DETECTED NOT DETECTED Final   Listeria monocytogenes NOT DETECTED NOT DETECTED Final    Staphylococcus species DETECTED (A) NOT DETECTED Final    Comment: CRITICAL RESULT CALLED TO, READ BACK BY AND  VERIFIED WITH: G ABBOTT PHARMD 07/13/20 0207 JDW    Staphylococcus aureus (BCID) NOT DETECTED NOT DETECTED Final   Staphylococcus epidermidis NOT DETECTED NOT DETECTED Final   Staphylococcus lugdunensis NOT DETECTED NOT DETECTED Final   Streptococcus species NOT DETECTED NOT DETECTED Final   Streptococcus agalactiae NOT DETECTED NOT DETECTED Final   Streptococcus pneumoniae NOT DETECTED NOT DETECTED Final   Streptococcus pyogenes NOT DETECTED NOT DETECTED Final   A.calcoaceticus-baumannii NOT DETECTED NOT DETECTED Final   Bacteroides fragilis NOT DETECTED NOT DETECTED Final   Enterobacterales NOT DETECTED NOT DETECTED Final   Enterobacter cloacae complex NOT DETECTED NOT DETECTED Final   Escherichia coli NOT DETECTED NOT DETECTED Final   Klebsiella aerogenes NOT DETECTED NOT DETECTED Final   Klebsiella oxytoca NOT DETECTED NOT DETECTED Final   Klebsiella pneumoniae NOT DETECTED NOT DETECTED Final   Proteus species NOT DETECTED NOT DETECTED Final   Salmonella species NOT DETECTED NOT DETECTED Final   Serratia marcescens NOT DETECTED NOT DETECTED Final   Haemophilus influenzae NOT DETECTED NOT DETECTED Final   Neisseria meningitidis NOT DETECTED NOT DETECTED Final   Pseudomonas aeruginosa NOT DETECTED NOT DETECTED Final   Stenotrophomonas maltophilia NOT DETECTED NOT DETECTED Final   Candida albicans NOT DETECTED NOT DETECTED Final   Candida auris NOT DETECTED NOT DETECTED Final   Candida glabrata NOT DETECTED NOT DETECTED Final   Candida krusei NOT DETECTED NOT DETECTED Final   Candida parapsilosis NOT DETECTED NOT DETECTED Final   Candida tropicalis NOT DETECTED NOT DETECTED Final   Cryptococcus neoformans/gattii NOT DETECTED NOT DETECTED Final    Comment: Performed at [facility], [address], [place], [place] [address]  Resp Panel by RT-PCR (Flu A&B, Covid)  Nasopharyngeal Swab     Status: None   Collection Time: 07/03/2020  7:22 PM   Specimen: Nasopharyngeal Swab; Nasopharyngeal(NP) swabs in vial transport medium  Result Value Ref Range Status   SARS Coronavirus 2 by RT PCR NEGATIVE NEGATIVE Final    Comment: (NOTE) SARS-CoV-2 target nucleic acids are NOT DETECTED.  The SARS-CoV-2 RNA is generally detectable in upper respiratory specimens during the acute phase of infection. The lowest concentration of SARS-CoV-2 viral copies this assay can detect is 138 copies/mL. A negative result does not preclude SARS-Cov-2 infection and should not be used as the sole basis for treatment or other patient management decisions. A negative result may occur with  improper specimen collection/handling, submission of specimen other than nasopharyngeal swab, presence of viral mutation(s) within the areas targeted by this assay, and inadequate number of viral copies(<138 copies/mL). A negative result must be combined with clinical observations, patient history, and epidemiological information. The expected result is Negative.  Fact Sheet for Patients:  EntrepreneurPulse.com.au  Fact Sheet for Healthcare Providers:  IncredibleEmployment.be  This test is no t yet approved or cleared by the [place] FDA and  has been authorized for detection and/or diagnosis of SARS-CoV-2 by FDA under an Emergency Use Authorization (EUA). This EUA will remain  in effect (meaning this test can be used) for the duration of the COVID-19 declaration under Section 564(b)(1) of the Act, 21 U.S.C.section 360bbb-3(b)(1), unless the authorization is terminated  or revoked sooner.       Influenza A by PCR NEGATIVE NEGATIVE Final   Influenza B by PCR NEGATIVE NEGATIVE Final    Comment: (NOTE) The Xpert Xpress SARS-CoV-2/FLU/RSV plus assay is intended as an aid in the diagnosis of influenza from Nasopharyngeal swab specimens and should not be  used as a sole basis for treatment. Nasal washings and aspirates are unacceptable for Xpert Xpress SARS-CoV-2/FLU/RSV testing.  Fact Sheet for Patients: EntrepreneurPulse.com.au  Fact Sheet for Healthcare Providers: IncredibleEmployment.be  This test is not yet approved or cleared by the [place] FDA and has been authorized for detection and/or diagnosis of SARS-CoV-2 by FDA under an Emergency Use Authorization (EUA). This EUA will remain in effect (meaning this test can be used) for the duration of the COVID-19 declaration under Section 564(b)(1) of the Act, 21 U.S.C. section 360bbb-3(b)(1), unless the authorization is terminated or revoked.  Performed at [facility], [place] [address] [place], [place] [address]   Culture, blood (routine x 2)     Status: None (Preliminary result)   Collection Time: 07/05/2020  7:23 PM   Specimen: BLOOD  Result Value Ref Range Status   Specimen Description BLOOD RIGHT ANTECUBITAL  Final   Special Requests   Final    BOTTLES DRAWN AEROBIC AND ANAEROBIC Blood Culture adequate volume   Culture   Final    NO GROWTH 3 DAYS Performed at [facility], [place] [address] [place], [place] [address]    Report Status PENDING  Incomplete  MRSA Next Gen by PCR, Nasal     Status: Abnormal   Collection Time: 06/30/2020 10:29 PM   Specimen: Nasal Mucosa; Nasal Swab  Result Value Ref Range Status   MRSA by PCR Next Gen DETECTED (A) NOT DETECTED Final    Comment: RESULT CALLED TO, READ BACK BY AND VERIFIED WITH: Ayesha Rumpf RN 07/12/20 0428 JDW (NOTE) The GeneXpert MRSA Assay (FDA approved for NASAL specimens only), is one component of a comprehensive MRSA colonization surveillance program. It is not intended to diagnose MRSA infection nor to guide or monitor treatment for MRSA infections. Test performance is not FDA approved in patients less than 31 years old. Performed at [facility], [place] [address],  [place], [place] [address]   Culture, Respiratory w Gram Stain     Status: None   Collection Time: 07/12/20  3:48 PM   Specimen: Tracheal Aspirate; Respiratory  Result Value Ref Range Status   Specimen Description TRACHEAL ASPIRATE  Final   Special Requests NONE  Final   Gram Stain   Final    MODERATE WBC PRESENT, PREDOMINANTLY PMN FEW GRAM NEGATIVE RODS FEW GRAM POSITIVE COCCI IN PAIRS    Culture   Final    MODERATE ACINETOBACTER BAUMANNII MULTI-DRUG RESISTANT ORGANISM RESULT CALLED TO, READ BACK BY AND VERIFIED WITH: RN J.BEABRAUT ON [id number] AT 1410 BY E.PARRISH Performed at [facility], [place] [address] [place], [place] [address]    Report Status 07/14/2020 FINAL  Final   Organism ID, Bacteria ACINETOBACTER BAUMANNII  Final      Susceptibility   Acinetobacter baumannii - MIC*    CEFTAZIDIME 32 RESISTANT Resistant     CIPROFLOXACIN >=4 RESISTANT Resistant     GENTAMICIN >=16 RESISTANT Resistant     IMIPENEM >=16 RESISTANT Resistant     PIP/TAZO >=128 RESISTANT Resistant     TRIMETH/SULFA >=320 RESISTANT Resistant     AMPICILLIN/SULBACTAM 8 SENSITIVE Sensitive     * MODERATE ACINETOBACTER BAUMANNII  Culture, Urine     Status: Abnormal   Collection Time: 07/13/20 11:26 AM   Specimen: Urine, Random  Result Value Ref Range Status   Specimen Description URINE, RANDOM  Final   Special Requests   Final    NONE Performed at [facility], [address] [place], [place] [address]    Culture MULTIPLE SPECIES PRESENT, SUGGEST RECOLLECTION (A)  Final   Report Status 07/14/2020 FINAL  Final    Studies/Results: DG CHEST PORT 1 VIEW  Result Date: 07/14/2020 CLINICAL DATA:  Respiratory failure. EXAM: PORTABLE CHEST 1 VIEW COMPARISON:  July 12, 2020. FINDINGS: Stable cardiomediastinal silhouette. Tracheostomy tube is in good position. Stable bilateral lung opacities are noted concerning for edema or pneumonia. Stable bilateral pleural effusions. No definite pneumothorax. Bony  thorax is unremarkable. IMPRESSION: Stable bilateral lung opacities described above. Stable bilateral pleural effusions. Electronically Signed   By: Marijo Conception M.D.   On: 07/14/2020 08:10     Assessment/Plan: Sepsis VDRF/HCAP- acinetobacter BCx+ 1/4 Staph auricularis Hyperkalemia DM2 Elevated Vanco level/therapeutic drug monitoring Anemia, microcytic Decubitus Ulcers  Total days of antibiotics: 4 vanco/merrem --> unasyn  Tmax 100.3 WBC 28.9 --> 35.4 FiO2 has been variable 50-80 last 24h off pressors (on midodrine now) BCx is contaminant, agree with stopping vanco Follow his abd exam. Abd imaging? The sulbactam of the unasyn has activity against acinetobacter.  Will continue to watch.          Bobby Rumpf MD, FACP Infectious Diseases (pager) [phone] [email or web address] 07/15/2020, 8:11 AM  LOS: 4 days    prog

## 2020-07-16 ENCOUNTER — Other Ambulatory Visit: Payer: Self-pay

## 2020-07-16 DIAGNOSIS — J158 Pneumonia due to other specified bacteria: Secondary | ICD-10-CM | POA: Diagnosis not present

## 2020-07-16 DIAGNOSIS — J189 Pneumonia, unspecified organism: Secondary | ICD-10-CM | POA: Diagnosis not present

## 2020-07-16 DIAGNOSIS — J9601 Acute respiratory failure with hypoxia: Secondary | ICD-10-CM | POA: Diagnosis not present

## 2020-07-16 DIAGNOSIS — R6521 Severe sepsis with septic shock: Secondary | ICD-10-CM | POA: Diagnosis not present

## 2020-07-16 DIAGNOSIS — A419 Sepsis, unspecified organism: Secondary | ICD-10-CM | POA: Diagnosis not present

## 2020-07-16 LAB — TYPE AND SCREEN
ABO/RH(D): O POS
ABO/RH(D): O POS
Antibody Screen: NEGATIVE
Antibody Screen: NEGATIVE
Unit division: 0

## 2020-07-16 LAB — BASIC METABOLIC PANEL
Anion gap: 8 (ref 5–15)
BUN: 157 mg/dL — ABNORMAL HIGH (ref 8–23)
CO2: 26 mmol/L (ref 22–32)
Calcium: 8.4 mg/dL — ABNORMAL LOW (ref 8.9–10.3)
Chloride: 108 mmol/L (ref 98–111)
Creatinine, Ser: 1.06 mg/dL (ref 0.61–1.24)
GFR, Estimated: >60 mL/min (ref >60)
Glucose, Bld: 181 mg/dL — ABNORMAL HIGH (ref 70–99)
Potassium: 5.6 mmol/L — ABNORMAL HIGH (ref 3.5–5.1)
Sodium: 142 mmol/L (ref 135–145)

## 2020-07-16 LAB — BPAM RBC
Blood Product Expiration Date: 202208032359
ISSUE DATE / TIME: 202207021338
Unit Type and Rh: 5100

## 2020-07-16 LAB — CULTURE, BLOOD (ROUTINE X 2)
Culture: NO GROWTH
Special Requests: ADEQUATE

## 2020-07-16 LAB — CBC
HCT: 23.3 % — ABNORMAL LOW (ref 39.0–52.0)
Hemoglobin: 7.4 g/dL — ABNORMAL LOW (ref 13.0–17.0)
MCH: 22 pg — ABNORMAL LOW (ref 26.0–34.0)
MCHC: 31.8 g/dL (ref 30.0–36.0)
MCV: 69.1 fL — ABNORMAL LOW (ref 80.0–100.0)
Platelets: 483 10*3/uL — ABNORMAL HIGH (ref 150–400)
RBC: 3.37 MIL/uL — ABNORMAL LOW (ref 4.22–5.81)
RDW: 25 % — ABNORMAL HIGH (ref 11.5–15.5)
WBC: 34.6 10*3/uL — ABNORMAL HIGH (ref 4.0–10.5)
nRBC: 0.8 % — ABNORMAL HIGH (ref 0.0–0.2)

## 2020-07-16 LAB — GLUCOSE, CAPILLARY
Glucose-Capillary: 178 mg/dL — ABNORMAL HIGH (ref 70–99)
Glucose-Capillary: 158 mg/dL — ABNORMAL HIGH (ref 70–99)
Glucose-Capillary: 163 mg/dL — ABNORMAL HIGH (ref 70–99)
Glucose-Capillary: 167 mg/dL — ABNORMAL HIGH (ref 70–99)
Glucose-Capillary: 154 mg/dL — ABNORMAL HIGH (ref 70–99)
Glucose-Capillary: 140 mg/dL — ABNORMAL HIGH (ref 70–99)

## 2020-07-16 LAB — PHOSPHORUS: Phosphorus: 7 mg/dL — ABNORMAL HIGH (ref 2.5–4.6)

## 2020-07-16 LAB — MAGNESIUM: Magnesium: 2.4 mg/dL (ref 1.7–2.4)

## 2020-07-16 MED ORDER — FUROSEMIDE 10 MG/ML IJ SOLN
40.0000 mg | Freq: Once | INTRAMUSCULAR | Status: AC
  Administered 2020-07-16: 40 mg via INTRAVENOUS
  Filled 2020-07-16: qty 4

## 2020-07-16 MED ORDER — SODIUM ZIRCONIUM CYCLOSILICATE 10 G PO PACK
10.0000 g | PACK | Freq: Once | ORAL | Status: AC
  Administered 2020-07-16: 10 g
  Filled 2020-07-16: qty 1

## 2020-07-16 NOTE — Progress Notes (Signed)
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 Most recent admission September 2021 in setting of sepsis due to MDR Klebsiella with right ureteral calculus s/p double J ureteral stent placement and treated with meropenem x 10days. He was discharged back to [facility].  6/29 patient presented to [facility] via EMS for AMS and hypotension. Per reports, patient is Aox4 at baseline but had developed AMS and had low-grade fevers with hypotension. This is on the background of undergoing pneumonia treatment for approximately one month in [facility] as well as recent worsening of hypertension causing an increase in his routine medications. Upon EMS arrival to [facility], BP 80/50 which did not improve with 1L crystalloid bolus and Levo started en route, requiring uptitration in ED with improvement to 120/82. He was also found to be hypoxemic requiring high percent FiO2 on ventilator. Thick secretions in ET tube suction and cloudy urine noted by ED team. PCCM asked to admit.   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  Anemia  DM II  HTN  Urinary Retention Renal Calculi  Frequent PNA's Chronic [facility] Resident   Significant Hospital Events: Including procedures, antibiotic start and stop dates in addition to other pertinent events   6/29 admitted to ICU for septic shock on levo to maintain MAP>65, continued on vent support   6/29 Blood Cx>> staph species in 1 of 4 cultures drawn>> suspect contaminant 6/30 Off pressors 7/2 ID Consult for MDR acinetobacter in trach asp, Meropenem changed to unasyn  Interim History / Subjective:   Remains on the ventilator, no change in poor mental status  Objective   Blood pressure 106/63, pulse 89, temperature 98 F (36.7 C), temperature source Oral, resp. rate (!) 26, height '5\' 6"'$  (1.676 m), weight 92.8 kg, SpO2 93 %.    Vent Mode: PRVC FiO2 (%):  [60 %-80 %] 60 % Set Rate:  [26 bmp] 26 bmp Vt Set:  [450 mL] 450 mL PEEP:  [10 cmH20] 10 cmH20 Plateau Pressure:  [34 cmH20-37 cmH20] 34 cmH20   Intake/Output Summary (Last 24 hours) at 07/16/2020 1110 Last data filed at 07/16/2020 1100 Gross per 24 hour  Intake 1969.88 ml  Output 1000 ml  Net 969.88 ml   Filed Weights   07/14/20 0347 07/15/20 0500 07/16/20 0500  Weight: 91.8 kg 91.3 kg 92.8 kg    Examination: Blood pressure 106/63, pulse 89, temperature 98 F (36.7 C), temperature source Oral, resp. rate (!) 26, height '5\' 6"'$  (1.676 m), weight 92.8 kg, SpO2 93 %. Gen:      No acute distress HEENT:  EOMI, sclera anicteric Neck:     No masses; no thyromegaly, trach Lungs:    Clear to auscultation bilaterally; normal respiratory effort CV:         Regular rate and rhythm; no murmurs Abd:      + bowel sounds; soft, non-tender; no palpable masses, no distension Ext:    No edema; adequate peripheral perfusion Skin:  Warm and dry; no rash Neuro: Unresponsive  Labs/imaging reviewed Significant for potassium 5.6, BUN/creatinine 157/1.06, WBC count remains elevated at 34.6, platelets 483 No new imaging  Resolved Hospital Problem list   Hyperkalemia  Assessment & Plan:   Septic Shock (POA) in setting of HCAP PNA and UTI, present on admission Hx of MDR Kle[place]se[place], MRSA and Pseudomonas. Hx of pneumonia for which he was being treated at [facility]. Procal remains elevated >5. UA consistent with UTI. Significant  leukocytosis on labs but remains afebrile. Sacral ulcer does not appear to be infected at this time. Currently on broad spectrum antibiotics.  CXR with multifocal infiltrates (PNA vs edema)  Off vancomycin as blood cultures thought to be contaminant Meropenem changed to Unasyn to cover Acinetobacter Off pressors, continue midodrine  Acute on chronic hypoxemic respiratory failure (POA) Acinetobacter in Tracheal Aspirate Likely 2/2 PNA vs hypervolemia as patient has significant pitting edema in bilat lower extremities (no history of HF so likely 2/2 third spacing from hypoalbuminemia). CXR with stable multifocal pneumonia and RUL collapse with bilateral pleural effusions. 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  Continue vent, wean PEEP/FiO2 Follow intermittent chest x-ray Mucomyst, nebs, guaifenesin for mucociliary clearance Chest PT  Acute metabolic encephalopathy (POA) 2/2 acidosis vs uremia  Respiratory and metabolic acidosis; slightly improved. However, remains mostly unresponsive to verbal or physical stimuli.  Monitor  Anemia of chronic illness No evidence of active bleed S/p PRBC transfusion 7/2 Iron replacement  Severe protein calorie malnutrition (POA) Continue tube feeds  AKI secondary to ATN in the setting of sepsis Hyperkalemia Lokelma Lasix 40 mg x 1 Follow urine output and creatinine  Thrombocytosis Likely reactive in setting of acute infection as above.  Follow CBC  DM II Lantus, SSI  PAF Currently NSR.  Continue amiodarone, Xarelto  Periorbital Edema / Mild Exophthalmus  TSH is normal  Sacral decubitus ulcer stage IV (POA) Wound care  Best Practice (right click and "Reselect all SmartList Selections" daily)   Diet/type: tubefeeds DVT prophylaxis: DOAC GI prophylaxis: PPI Lines: N/A Foley:  Yes, and it is still needed Code Status:  full code  Critical care time:     The patient is critically ill with multiple organ  system failure and requires high complexity decision making for assessment and support, frequent evaluation and titration of therapies, advanced monitoring, review of radiographic studies and interpretation of complex data.   Critical Care Time devoted to patient care services, exclusive of separately billable procedures, described in this note is 45  minutes.   Marshell Garfinkel MD [facility] See Amion for pager  If no response to pager , please call [phone] until 7pm After 7:00 pm call Elink  [phone] 07/16/2020, 11:10 AM

## 2020-07-16 NOTE — Progress Notes (Signed)
INFECTIOUS DISEASE PROGRESS NOTE  ID: Luis Orr is a 70 y.o. male with  Active Problems:   Septic shock ([facility])   Pressure injury of skin   Acute respiratory failure with hypoxia (HCC)  Subjective: On vent, off pressors.   Abtx:  Anti-infectives (From admission, onward)    Start     Dose/Rate Route Frequency Ordered Stop   07/14/20 1530  Ampicillin-Sulbactam (UNASYN) 3 g in sodium chloride 0.9 % 100 mL IVPB        3 g 200 mL/hr over 30 Minutes Intravenous Every 6 hours 07/14/20 1440     07/13/20 2000  meropenem (MERREM) 2 g in sodium chloride 0.9 % 100 mL IVPB  Status:  Discontinued        2 g 200 mL/hr over 30 Minutes Intravenous Every 12 hours 07/13/20 1433 07/14/20 1440   07/13/20 1432  vancomycin variable dose per unstable renal function (pharmacist dosing)  Status:  Discontinued         Does not apply See admin instructions 07/13/20 1433 07/15/20 0937   07/13/20 1400  vancomycin (VANCOCIN) IVPB 750 mg/150 ml premix  Status:  Discontinued        750 mg 150 mL/hr over 60 Minutes Intravenous Every 12 hours 07/13/20 1321 07/13/20 1430   07/12/20 1248  vancomycin (VANCOREADY) IVPB 750 mg/150 mL  Status:  Discontinued        750 mg 150 mL/hr over 60 Minutes Intravenous Every 12 hours 07/12/20 1248 07/13/20 1321   06/24/2020 2300  meropenem (MERREM) 2 g in sodium chloride 0.9 % 100 mL IVPB  Status:  Discontinued        2 g 200 mL/hr over 30 Minutes Intravenous Every 8 hours 06/27/2020 2209 07/13/20 1433   06/15/2020 2205  vancomycin variable dose per unstable renal function (pharmacist dosing)  Status:  Discontinued         Does not apply See admin instructions 07/10/2020 2209 07/12/20 1248   06/23/2020 1845  vancomycin (VANCOREADY) IVPB 1500 mg/300 mL        1,500 mg 150 mL/hr over 120 Minutes Intravenous  Once 07/07/2020 1843 07/05/2020 2132   07/04/2020 1845  ceFEPIme (MAXIPIME) 2 g in sodium chloride 0.9 % 100 mL IVPB        2 g 200 mL/hr over 30 Minutes Intravenous  Once 06/29/2020  1843 06/28/2020 1952       Medications: Scheduled:  amiodarone  200 mg Per Tube Daily   chlorhexidine gluconate (MEDLINE KIT)  15 mL Mouth Rinse BID   Chlorhexidine Gluconate Cloth  6 each Topical Q1200   docusate  100 mg Per Tube BID   feeding supplement (PROSource TF)  45 mL Per Tube QID   ferrous sulfate  220 mg Per Tube Q breakfast   furosemide  40 mg Intravenous Once   guaiFENesin  5 mL Per Tube BID   insulin aspart  0-15 Units Subcutaneous Q4H   insulin glargine  8 Units Subcutaneous BID   mouth rinse  15 mL Mouth Rinse 10 times per day   midodrine  5 mg Per Tube Q8H   multivitamin with minerals  1 tablet Per Tube Daily   mupirocin ointment  1 application Nasal BID   pantoprazole (PROTONIX) IV  40 mg Intravenous QHS   polyethylene glycol  17 g Per Tube Daily   rivaroxaban  20 mg Per Tube Daily   sodium chloride flush  10-40 mL Intracatheter Q12H    Objective: Vital signs  in last 24 hours: Temp:  [98 F (36.7 C)-99.7 F (37.6 C)] 98 F (36.7 C) (07/04 0700) Pulse Rate:  [84-96] 89 (07/04 1100) Resp:  [22-31] 26 (07/04 1100) BP: (87-122)/(49-75) 106/63 (07/04 1100) SpO2:  [89 %-99 %] 90 % (07/04 1131) FiO2 (%):  [40 %-80 %] 40 % (07/04 1131) Weight:  [92.8 kg] 92.8 kg (07/04 0500)   General appearance: no distress Resp: rhonchi anterior - bilateral Cardio: regular rate and rhythm GI: abnormal findings:  distended and hypoactive bowel sounds Extremities: edema anasarca  Lab Results Recent Labs    07/15/20 0120 07/16/20 0430  WBC 35.4* 34.6*  HGB 8.4* 7.4*  HCT 25.6* 23.3*  NA 138 142  K 5.0 5.6*  CL 103 108  CO2 23 26  BUN 132* 157*  CREATININE 0.89 1.06   Liver Panel No results for input(s): PROT, ALBUMIN, AST, ALT, ALKPHOS, BILITOT, BILIDIR, IBILI in the last 72 hours. Sedimentation Rate No results for input(s): ESRSEDRATE in the last 72 hours. C-Reactive Protein No results for input(s): CRP in the last 72 hours.  Microbiology: Recent Results  (from the past 240 hour(s))  Urine culture     Status: Abnormal   Collection Time: 07/10/2020  6:24 PM   Specimen: Urine, Random  Result Value Ref Range Status   Specimen Description URINE, RANDOM  Final   Special Requests   Final    NONE Performed at [facility], [address] [place], [place] [address]    Culture MULTIPLE SPECIES PRESENT, SUGGEST RECOLLECTION (A)  Final   Report Status 07/13/2020 FINAL  Final  Culture, blood (routine x 2)     Status: Abnormal   Collection Time: 07/01/2020  6:42 PM   Specimen: BLOOD LEFT FOREARM  Result Value Ref Range Status   Specimen Description BLOOD LEFT FOREARM  Final   Special Requests   Final    BOTTLES DRAWN AEROBIC AND ANAEROBIC Blood Culture results may not be optimal due to an inadequate volume of blood received in culture bottles   Culture  Setup Time   Final    ANAEROBIC BOTTLE ONLY GRAM POSITIVE COCCI CRITICAL RESULT CALLED TO, READ BACK BY AND VERIFIED WITH: G ABBOTT PHARMD 07/13/20 0207 JDW    Culture (A)  Final    STAPHYLOCOCCUS AURICULARIS THE SIGNIFICANCE OF ISOLATING THIS ORGANISM FROM A SINGLE SET OF BLOOD CULTURES WHEN MULTIPLE SETS ARE DRAWN IS UNCERTAIN. PLEASE NOTIFY THE MICROBIOLOGY DEPARTMENT WITHIN ONE WEEK IF SPECIATION AND SENSITIVITIES ARE REQUIRED. Performed at [facility], [place] [address] [place], [place] [address]    Report Status 07/14/2020 FINAL  Final  Blood Culture ID Panel (Reflexed)     Status: Abnormal   Collection Time: 06/22/2020  6:42 PM  Result Value Ref Range Status   Enterococcus faecalis NOT DETECTED NOT DETECTED Final   Enterococcus Faecium NOT DETECTED NOT DETECTED Final   Listeria monocytogenes NOT DETECTED NOT DETECTED Final   Staphylococcus species DETECTED (A) NOT DETECTED Final    Comment: CRITICAL RESULT CALLED TO, READ BACK BY AND VERIFIED WITH: G ABBOTT PHARMD 07/13/20 0207 JDW    Staphylococcus aureus (BCID) NOT DETECTED NOT DETECTED Final   Staphylococcus epidermidis NOT  DETECTED NOT DETECTED Final   Staphylococcus lugdunensis NOT DETECTED NOT DETECTED Final   Streptococcus species NOT DETECTED NOT DETECTED Final   Streptococcus agalactiae NOT DETECTED NOT DETECTED Final   Streptococcus pneumoniae NOT DETECTED NOT DETECTED Final   Streptococcus pyogenes NOT DETECTED NOT DETECTED Final  A.calcoaceticus-baumannii NOT DETECTED NOT DETECTED Final   Bacteroides fragilis NOT DETECTED NOT DETECTED Final   Enterobacterales NOT DETECTED NOT DETECTED Final   Enterobacter cloacae complex NOT DETECTED NOT DETECTED Final   Escherichia coli NOT DETECTED NOT DETECTED Final   Klebsiella aerogenes NOT DETECTED NOT DETECTED Final   Klebsiella oxytoca NOT DETECTED NOT DETECTED Final   Klebsiella pneumoniae NOT DETECTED NOT DETECTED Final   Proteus species NOT DETECTED NOT DETECTED Final   Salmonella species NOT DETECTED NOT DETECTED Final   Serratia marcescens NOT DETECTED NOT DETECTED Final   Haemophilus influenzae NOT DETECTED NOT DETECTED Final   Neisseria meningitidis NOT DETECTED NOT DETECTED Final   Pseudomonas aeruginosa NOT DETECTED NOT DETECTED Final   Stenotrophomonas maltophilia NOT DETECTED NOT DETECTED Final   Candida albicans NOT DETECTED NOT DETECTED Final   Candida auris NOT DETECTED NOT DETECTED Final   Candida glabrata NOT DETECTED NOT DETECTED Final   Candida krusei NOT DETECTED NOT DETECTED Final   Candida parapsilosis NOT DETECTED NOT DETECTED Final   Candida tropicalis NOT DETECTED NOT DETECTED Final   Cryptococcus neoformans/gattii NOT DETECTED NOT DETECTED Final    Comment: Performed at [facility], [place], [place], [place] [address]  Resp Panel by RT-PCR (Flu A&B, Covid) Nasopharyngeal Swab     Status: None   Collection Time: 06/21/2020  7:22 PM   Specimen: Nasopharyngeal Swab; Nasopharyngeal(NP) swabs in vial transport medium  Result Value Ref Range Status   SARS Coronavirus 2 by RT PCR NEGATIVE NEGATIVE Final    Comment:  (NOTE) SARS-CoV-2 target nucleic acids are NOT DETECTED.  The SARS-CoV-2 RNA is generally detectable in upper respiratory specimens during the acute phase of infection. The lowest concentration of SARS-CoV-2 viral copies this assay can detect is 138 copies/mL. A negative result does not preclude SARS-Cov-2 infection and should not be used as the sole basis for treatment or other patient management decisions. A negative result may occur with  improper specimen collection/handling, submission of specimen other than nasopharyngeal swab, presence of viral mutation(s) within the areas targeted by this assay, and inadequate number of viral copies(<138 copies/mL). A negative result must be combined with clinical observations, patient history, and epidemiological information. The expected result is Negative.  Fact Sheet for Patients:  EntrepreneurPulse.com.au  Fact Sheet for Healthcare Providers:  IncredibleEmployment.be  This test is no t yet approved or cleared by the [place] FDA and  has been authorized for detection and/or diagnosis of SARS-CoV-2 by FDA under an Emergency Use Authorization (EUA). This EUA will remain  in effect (meaning this test can be used) for the duration of the COVID-19 declaration under Section 564(b)(1) of the Act, 21 U.S.C.section 360bbb-3(b)(1), unless the authorization is terminated  or revoked sooner.       Influenza A by PCR NEGATIVE NEGATIVE Final   Influenza B by PCR NEGATIVE NEGATIVE Final    Comment: (NOTE) The Xpert Xpress SARS-CoV-2/FLU/RSV plus assay is intended as an aid in the diagnosis of influenza from Nasopharyngeal swab specimens and should not be used as a sole basis for treatment. Nasal washings and aspirates are unacceptable for Xpert Xpress SARS-CoV-2/FLU/RSV testing.  Fact Sheet for Patients: EntrepreneurPulse.com.au  Fact Sheet for Healthcare  Providers: IncredibleEmployment.be  This test is not yet approved or cleared by the [place] FDA and has been authorized for detection and/or diagnosis of SARS-CoV-2 by FDA under an Emergency Use Authorization (EUA). This EUA will remain in effect (meaning this test can be used)  for the duration of the COVID-19 declaration under Section 564(b)(1) of the Act, 21 U.S.C. section 360bbb-3(b)(1), unless the authorization is terminated or revoked.  Performed at [facility], [place] [address], [place], [place] [address]   Culture, blood (routine x 2)     Status: None   Collection Time: 07/06/2020  7:23 PM   Specimen: BLOOD  Result Value Ref Range Status   Specimen Description BLOOD RIGHT ANTECUBITAL  Final   Special Requests   Final    BOTTLES DRAWN AEROBIC AND ANAEROBIC Blood Culture adequate volume   Culture   Final    NO GROWTH 5 DAYS Performed at [facility], [place] [address] [place], [place] [address]    Report Status 07/16/2020 FINAL  Final  MRSA Next Gen by PCR, Nasal     Status: Abnormal   Collection Time: 07/06/2020 10:29 PM   Specimen: Nasal Mucosa; Nasal Swab  Result Value Ref Range Status   MRSA by PCR Next Gen DETECTED (A) NOT DETECTED Final    Comment: RESULT CALLED TO, READ BACK BY AND VERIFIED WITH: Ayesha Rumpf RN 07/12/20 0428 JDW (NOTE) The GeneXpert MRSA Assay (FDA approved for NASAL specimens only), is one component of a comprehensive MRSA colonization surveillance program. It is not intended to diagnose MRSA infection nor to guide or monitor treatment for MRSA infections. Test performance is not FDA approved in patients less than 1 years old. Performed at [facility], [place] [address], [place], [place] [address]   Culture, Respiratory w Gram Stain     Status: None   Collection Time: 07/12/20  3:48 PM   Specimen: Tracheal Aspirate; Respiratory  Result Value Ref Range Status   Specimen Description TRACHEAL ASPIRATE  Final    Special Requests NONE  Final   Gram Stain   Final    MODERATE WBC PRESENT, PREDOMINANTLY PMN FEW GRAM NEGATIVE RODS FEW GRAM POSITIVE COCCI IN PAIRS    Culture   Final    MODERATE ACINETOBACTER BAUMANNII MULTI-DRUG RESISTANT ORGANISM RESULT CALLED TO, READ BACK BY AND VERIFIED WITH: RN J.BEABRAUT ON [id number] AT 1410 BY E.PARRISH Performed at [facility], [place] [address] [place], [place] [address]    Report Status 07/14/2020 FINAL  Final   Organism ID, Bacteria ACINETOBACTER BAUMANNII  Final      Susceptibility   Acinetobacter baumannii - MIC*    CEFTAZIDIME 32 RESISTANT Resistant     CIPROFLOXACIN >=4 RESISTANT Resistant     GENTAMICIN >=16 RESISTANT Resistant     IMIPENEM >=16 RESISTANT Resistant     PIP/TAZO >=128 RESISTANT Resistant     TRIMETH/SULFA >=320 RESISTANT Resistant     AMPICILLIN/SULBACTAM 8 SENSITIVE Sensitive     * MODERATE ACINETOBACTER BAUMANNII  Culture, Urine     Status: Abnormal   Collection Time: 07/13/20 11:26 AM   Specimen: Urine, Random  Result Value Ref Range Status   Specimen Description URINE, RANDOM  Final   Special Requests   Final    NONE Performed at [facility], [place] [address] [place], [place] [address]    Culture MULTIPLE SPECIES PRESENT, SUGGEST RECOLLECTION (A)  Final   Report Status 07/14/2020 FINAL  Final    Studies/Results: DG CHEST PORT 1 VIEW  Result Date: 07/15/2020 CLINICAL DATA:  Acute respiratory failure with hypoxia. EXAM: PORTABLE CHEST 1 VIEW COMPARISON:  July 14, 2020. FINDINGS: Stable cardiomediastinal silhouette. Tracheostomy tube is unchanged in position. Stable bilateral lung opacities are noted concerning  for pneumonia or edema. Small bilateral pleural effusions are noted. No pneumothorax is noted. Bony thorax is unremarkable. IMPRESSION: Stable bilateral lung opacities as described above. Electronically Signed   By: Marijo Conception M.D.   On: 07/15/2020 08:19     Assessment/Plan: Sepsis VDRF/HCAP-  acinetobacter BCx+ 1/4 Staph auricularis Hyperkalemia DM2 Elevated Vanco level/therapeutic drug monitoring Anemia, microcytic Decubitus Ulcers   Total days of antibiotics: 2 vanco/merrem --> 2 unasyn  Hopefully WBC has reached it's peak.  Temp resolved  Watch abd exam, if WBC persists, consider abd imaging.  Would give him 7 days of unasyn (for HCAP) Available as needed.          Bobby Rumpf MD, FACP Infectious Diseases (pager) [phone] [email or web address] 07/16/2020, 11:55 AM  LOS: 5 days

## 2020-07-16 NOTE — Consult Note (Signed)
[facility] Nurse ostomy consult note Patient receiving care in [place] with chronic trach, PEG, Foley and colostomy since 2018, from [facility] type/location: LLQ colostomy Stomal assessment/size: Unable to view, current pouch has beige front with no window Treatment options for stomal/peristomal skin:  Output: pouch 1/2 full with mushy stool Ostomy pouching: 2pc. 2 1/4" pouch Kellie Simmering #[phone]) skin barrier Kellie Simmering # [phone]) Education provided: None Enrolled patient in [place] program: No  2 piece pouching system   Fecal pouches , [place] #[phone]              Skin Barrier, Kellie Simmering # [phone]   [facility] Nurse Consult Note: Wound type: Stage 4 sacral wound Pressure Injury POA: Yes Measurement: See flow sheet Dressing procedure/placement/frequency: Apply moistened saline gauze into the wound and cover with 4 x 4s ABD pad and secure with medipore tape or may use foam dressing over dry 4 x 4s. Change BID.   Monitor the wound area(s) for worsening of condition such as: Signs/symptoms of infection, increase in size, development of or worsening of odor, development of pain, or increased pain at the affected locations.   Notify the medical team if any of these develop.  Thank you for the consult. [facility] nurse will not follow at this time.   Please re-consult the [facility] team if needed.  Cathlean Marseilles Tamala Julian, MSN, RN, [place], [place] [place], [facility] Wound Treatment Associate Pager [phone]

## 2020-07-17 ENCOUNTER — Inpatient Hospital Stay (HOSPITAL_COMMUNITY): Payer: 59

## 2020-07-17 DIAGNOSIS — L89004 Pressure ulcer of unspecified elbow, stage 4: Secondary | ICD-10-CM | POA: Diagnosis not present

## 2020-07-17 DIAGNOSIS — R600 Localized edema: Secondary | ICD-10-CM | POA: Diagnosis not present

## 2020-07-17 DIAGNOSIS — J9601 Acute respiratory failure with hypoxia: Secondary | ICD-10-CM | POA: Diagnosis not present

## 2020-07-17 LAB — GLUCOSE, CAPILLARY
Glucose-Capillary: 118 mg/dL — ABNORMAL HIGH (ref 70–99)
Glucose-Capillary: 142 mg/dL — ABNORMAL HIGH (ref 70–99)
Glucose-Capillary: 139 mg/dL — ABNORMAL HIGH (ref 70–99)
Glucose-Capillary: 117 mg/dL — ABNORMAL HIGH (ref 70–99)
Glucose-Capillary: 131 mg/dL — ABNORMAL HIGH (ref 70–99)
Glucose-Capillary: 87 mg/dL (ref 70–99)
Glucose-Capillary: 80 mg/dL (ref 70–99)

## 2020-07-17 LAB — CBC
HCT: 23.7 % — ABNORMAL LOW (ref 39.0–52.0)
Hemoglobin: 7.8 g/dL — ABNORMAL LOW (ref 13.0–17.0)
MCH: 22.8 pg — ABNORMAL LOW (ref 26.0–34.0)
MCHC: 32.9 g/dL (ref 30.0–36.0)
MCV: 69.3 fL — ABNORMAL LOW (ref 80.0–100.0)
Platelets: 565 10*3/uL — ABNORMAL HIGH (ref 150–400)
RBC: 3.42 MIL/uL — ABNORMAL LOW (ref 4.22–5.81)
RDW: 25.7 % — ABNORMAL HIGH (ref 11.5–15.5)
WBC: 36 10*3/uL — ABNORMAL HIGH (ref 4.0–10.5)
nRBC: 0.7 % — ABNORMAL HIGH (ref 0.0–0.2)

## 2020-07-17 LAB — BASIC METABOLIC PANEL
Anion gap: 12 (ref 5–15)
Anion gap: 10 (ref 5–15)
Anion gap: 13 (ref 5–15)
BUN: 169 mg/dL — ABNORMAL HIGH (ref 8–23)
BUN: 170 mg/dL — ABNORMAL HIGH (ref 8–23)
BUN: 185 mg/dL — ABNORMAL HIGH (ref 8–23)
CO2: 27 mmol/L (ref 22–32)
CO2: 27 mmol/L (ref 22–32)
CO2: 25 mmol/L (ref 22–32)
Calcium: 8.5 mg/dL — ABNORMAL LOW (ref 8.9–10.3)
Calcium: 8.4 mg/dL — ABNORMAL LOW (ref 8.9–10.3)
Calcium: 8.3 mg/dL — ABNORMAL LOW (ref 8.9–10.3)
Chloride: 105 mmol/L (ref 98–111)
Chloride: 105 mmol/L (ref 98–111)
Chloride: 105 mmol/L (ref 98–111)
Creatinine, Ser: 1.19 mg/dL (ref 0.61–1.24)
Creatinine, Ser: 1.28 mg/dL — ABNORMAL HIGH (ref 0.61–1.24)
Creatinine, Ser: 1.23 mg/dL (ref 0.61–1.24)
GFR, Estimated: >60 mL/min (ref >60)
GFR, Estimated: >60 mL/min (ref >60)
GFR, Estimated: >60 mL/min (ref >60)
Glucose, Bld: 138 mg/dL — ABNORMAL HIGH (ref 70–99)
Glucose, Bld: 145 mg/dL — ABNORMAL HIGH (ref 70–99)
Glucose, Bld: 136 mg/dL — ABNORMAL HIGH (ref 70–99)
Potassium: 5.9 mmol/L — ABNORMAL HIGH (ref 3.5–5.1)
Potassium: 6 mmol/L — ABNORMAL HIGH (ref 3.5–5.1)
Potassium: 6.1 mmol/L — ABNORMAL HIGH (ref 3.5–5.1)
Sodium: 144 mmol/L (ref 135–145)
Sodium: 142 mmol/L (ref 135–145)
Sodium: 143 mmol/L (ref 135–145)

## 2020-07-17 LAB — POTASSIUM
Potassium: 6.4 mmol/L (ref 3.5–5.1)
Potassium: 5.2 mmol/L — ABNORMAL HIGH (ref 3.5–5.1)
Potassium: 4.8 mmol/L (ref 3.5–5.1)

## 2020-07-17 LAB — PHOSPHORUS: Phosphorus: 8.3 mg/dL — ABNORMAL HIGH (ref 2.5–4.6)

## 2020-07-17 LAB — ECHOCARDIOGRAM COMPLETE
Area-P 1/2: 4.15 cm2
Height: 66 in
S' Lateral: 2.9 cm
Weight: 3298.08 oz

## 2020-07-17 LAB — MAGNESIUM: Magnesium: 2.4 mg/dL (ref 1.7–2.4)

## 2020-07-17 MED ORDER — PHENYLEPHRINE HCL-NACL 10-0.9 MG/250ML-% IV SOLN
0.0000 ug/min | INTRAVENOUS | Status: DC
  Administered 2020-07-17: 75 ug/min via INTRAVENOUS
  Administered 2020-07-17: 125 ug/min via INTRAVENOUS
  Filled 2020-07-17 (×2): qty 250

## 2020-07-17 MED ORDER — PERFLUTREN LIPID MICROSPHERE
1.0000 mL | INTRAVENOUS | Status: AC | PRN
  Administered 2020-07-17: 2 mL via INTRAVENOUS
  Filled 2020-07-17: qty 10

## 2020-07-17 MED ORDER — SODIUM ZIRCONIUM CYCLOSILICATE 10 G PO PACK
10.0000 g | PACK | Freq: Two times a day (BID) | ORAL | Status: DC
  Administered 2020-07-17: 10 g via ORAL
  Filled 2020-07-17: qty 1

## 2020-07-17 MED ORDER — SODIUM ZIRCONIUM CYCLOSILICATE 10 G PO PACK
10.0000 g | PACK | Freq: Once | ORAL | Status: AC
  Administered 2020-07-17: 10 g
  Filled 2020-07-17: qty 1

## 2020-07-17 MED ORDER — NOREPINEPHRINE 4 MG/250ML-% IV SOLN
2.0000 ug/min | INTRAVENOUS | Status: DC
  Administered 2020-07-17: 5 ug/min via INTRAVENOUS

## 2020-07-17 MED ORDER — NOREPINEPHRINE 4 MG/250ML-% IV SOLN
INTRAVENOUS | Status: AC
  Filled 2020-07-17: qty 250

## 2020-07-17 MED ORDER — SODIUM ZIRCONIUM CYCLOSILICATE 10 G PO PACK
10.0000 g | PACK | Freq: Once | ORAL | Status: AC
  Administered 2020-07-17: 10 g

## 2020-07-17 MED ORDER — MIDODRINE HCL 5 MG PO TABS
10.0000 mg | ORAL_TABLET | Freq: Three times a day (TID) | ORAL | Status: DC
  Administered 2020-07-17 – 2020-07-20 (×10): 10 mg
  Filled 2020-07-17 (×10): qty 2

## 2020-07-17 MED ORDER — SODIUM CHLORIDE 0.9 % IV SOLN
250.0000 mL | INTRAVENOUS | Status: DC
  Administered 2020-07-17: 500 mL via INTRAVENOUS
  Administered 2020-07-19 – 2020-07-25 (×5): 250 mL via INTRAVENOUS

## 2020-07-17 MED ORDER — SODIUM ZIRCONIUM CYCLOSILICATE 10 G PO PACK
10.0000 g | PACK | Freq: Every day | ORAL | Status: DC
  Administered 2020-07-18: 10 g
  Filled 2020-07-17: qty 1

## 2020-07-17 MED ORDER — SODIUM BICARBONATE 8.4 % IV SOLN
50.0000 meq | Freq: Once | INTRAVENOUS | Status: AC
  Administered 2020-07-17: 50 meq via INTRAVENOUS
  Filled 2020-07-17: qty 50

## 2020-07-17 MED ORDER — DEXTROSE 50 % IV SOLN
1.0000 | Freq: Once | INTRAVENOUS | Status: AC
  Administered 2020-07-17: 50 mL via INTRAVENOUS
  Filled 2020-07-17: qty 50

## 2020-07-17 MED ORDER — ALBUTEROL SULFATE (2.5 MG/3ML) 0.083% IN NEBU
10.0000 mg | INHALATION_SOLUTION | Freq: Once | RESPIRATORY_TRACT | Status: AC
  Administered 2020-07-17: 10 mg via RESPIRATORY_TRACT
  Filled 2020-07-17: qty 12

## 2020-07-17 MED ORDER — INSULIN ASPART 100 UNIT/ML IV SOLN
10.0000 [IU] | Freq: Once | INTRAVENOUS | Status: AC
  Administered 2020-07-17: 10 [IU] via INTRAVENOUS

## 2020-07-17 MED ORDER — HEPARIN (PORCINE) 25000 UT/250ML-% IV SOLN
1550.0000 [IU]/h | INTRAVENOUS | Status: AC
  Administered 2020-07-18: 1100 [IU]/h via INTRAVENOUS
  Administered 2020-07-19: 1350 [IU]/h via INTRAVENOUS
  Administered 2020-07-19 – 2020-07-24 (×7): 1600 [IU]/h via INTRAVENOUS
  Filled 2020-07-17 (×9): qty 250

## 2020-07-17 MED ORDER — MIDODRINE HCL 5 MG PO TABS: 5.0000 mg | ORAL_TABLET | Freq: Three times a day (TID) | ORAL | Status: DC

## 2020-07-17 MED ORDER — PHENYLEPHRINE CONCENTRATED 100MG/250ML (0.4 MG/ML) INFUSION SIMPLE
0.0000 ug/min | INTRAVENOUS | Status: DC
  Administered 2020-07-17 – 2020-07-18 (×2): 125 ug/min via INTRAVENOUS
  Filled 2020-07-17 (×3): qty 250

## 2020-07-17 MED ORDER — SODIUM ZIRCONIUM CYCLOSILICATE 10 G PO PACK
10.0000 g | PACK | Freq: Once | ORAL | Status: DC
  Filled 2020-07-17: qty 1

## 2020-07-17 MED ORDER — FUROSEMIDE 10 MG/ML IJ SOLN
40.0000 mg | Freq: Once | INTRAMUSCULAR | Status: AC
  Administered 2020-07-17: 40 mg via INTRAVENOUS
  Filled 2020-07-17: qty 4

## 2020-07-17 MED ORDER — FUROSEMIDE 10 MG/ML IJ SOLN
80.0000 mg | Freq: Once | INTRAMUSCULAR | Status: DC
  Filled 2020-07-17: qty 8

## 2020-07-17 MED ORDER — RIVAROXABAN 15 MG PO TABS
15.0000 mg | ORAL_TABLET | Freq: Every day | ORAL | Status: DC
  Administered 2020-07-17: 15 mg
  Filled 2020-07-17: qty 1

## 2020-07-17 MED ORDER — INSULIN ASPART 100 UNIT/ML IV SOLN
5.0000 [IU] | Freq: Once | INTRAVENOUS | Status: AC
  Administered 2020-07-17: 5 [IU] via INTRAVENOUS

## 2020-07-17 MED ORDER — FUROSEMIDE 10 MG/ML IJ SOLN: 80.0000 mg | Freq: Once | INTRAMUSCULAR | Status: DC

## 2020-07-17 NOTE — Progress Notes (Addendum)
SBP 76 . Dr Ander Slade informed . Midorine dose increased to 10 mg however I am unable to give this due to G tube is now profusely draining bile colored tube feed smelling drainage from around the insertion site . Dr Ander Slade informed . CT of abdomen ordered.

## 2020-07-17 NOTE — Progress Notes (Signed)
CT complete SBP decreased to 58 . Levophed started per orders SBP increased to 89

## 2020-07-17 NOTE — Progress Notes (Signed)
[facility] Progress Note Patient Name: Luis Orr DOB: January 20, 1950 MRN: [id number]   Date of Service  07/17/2020  HPI/Events of Note  Notified of K 5.9 Given Lokelma and Lasix previously without significant response. Still no BM No peaked T waves on Telemetry  eICU Interventions  Ordered D50/Insulin and another dose of Lokelma     Intervention Category Intermediate Interventions: Electrolyte abnormality - evaluation and management  Shona Needles Luis Orr 07/17/2020, 3:09 AM

## 2020-07-17 NOTE — Progress Notes (Signed)
Patient with Heart rate 130 after line placement.Levophed switched to phenylephrine per orders . Heart rate remains 130 . Dr Ander Slade updated

## 2020-07-17 NOTE — Progress Notes (Signed)
Spoke with patient's spouse  Discussed prognosis Patient is getting worse  To remain full code Continue current measures  She will discuss with concerns regarding goals of care  Patient will be started on pressors  Consent obtained for a central line

## 2020-07-17 NOTE — Progress Notes (Signed)
Central Venous Catheter Insertion Procedure Note  LANNY VONWALD  [id number]  11/10/1950  Date:07/17/20  Time:1:55 PM   Provider Performing:Inetha Maret A Ander Slade   Procedure: Insertion of Non-tunneled Central Venous [phone]) with US guidance [id number])   Indication(s) Medication administration and Difficult access  Consent Risks of the procedure as well as the alternatives and risks of each were explained to the patient and/or caregiver.  Consent for the procedure was obtained and is signed in the bedside chart  Anesthesia Topical only with 1% lidocaine   Timeout Verified patient identification, verified procedure, site/side was marked, verified correct patient position, special equipment/implants available, medications/allergies/relevant history reviewed, required imaging and test results available.  Sterile Technique Maximal sterile technique including full sterile barrier drape, hand hygiene, sterile gown, sterile gloves, mask, hair covering, sterile ultrasound probe cover (if used).  Procedure Description Area of catheter insertion was cleaned with chlorhexidine and draped in sterile fashion.  With real-time ultrasound guidance a central venous catheter was placed into the right internal jugular vein. Nonpulsatile blood flow and easy flushing noted in all ports.  The catheter was sutured in place and sterile dressing applied.  Complications/Tolerance None; patient tolerated the procedure well. Chest X-ray is ordered to verify placement for internal jugular or subclavian cannulation.   Chest x-ray is not ordered for femoral cannulation.  EBL Minimal  Specimen(s) None

## 2020-07-17 NOTE — Progress Notes (Signed)
K+ 6.4 slightly hemolyzed resulted to Dr Ander Slade .Will redraw after central line placed

## 2020-07-17 NOTE — Progress Notes (Signed)
Patient just arrived back from CT and is now getting labs. Will continue CPT next round.

## 2020-07-17 NOTE — Progress Notes (Signed)
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 Most recent admission September 2021 in setting of sepsis due to MDR Klebsiella with right ureteral calculus s/p double J ureteral stent placement and treated with meropenem x 10days. He was discharged back to [facility].  6/29 patient presented to [facility] via EMS for AMS and hypotension. Per reports, patient is Aox4 at baseline but had developed AMS and had low-grade fevers with hypotension. This is on the background of undergoing pneumonia treatment for approximately one month in [facility] as well as recent worsening of hypertension causing an increase in his routine medications. Upon EMS arrival to [facility], BP 80/50 which did not improve with 1L crystalloid bolus and Levo started en route, requiring uptitration in ED with improvement to 120/82. He was also found to be hypoxemic requiring high percent FiO2 on ventilator. Thick secretions in ET tube suction and cloudy urine noted by ED team. PCCM asked to admit.   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  Anemia  DM II  HTN  Urinary Retention with chronic foley  Renal Calculi  Frequent PNA's Chronic [facility] Resident   Significant Hospital Events: Including procedures, antibiotic start and stop dates in addition to other pertinent events   6/29 admitted to ICU for septic shock on levo to maintain MAP>65,  continued on vent support  6/29 Blood Cx>> staph species in 1 of 4 cultures drawn>> suspect contaminant 6/30 Off pressors 7/2 ID Consult for MDR acinetobacter in trach asp, Meropenem changed to unasyn 7/2 PRBC transfusion, iron replacement   Interim History / Subjective:   On Ventilator, continues to be unresponsive.  Objective   Blood pressure (!) 74/44, pulse (!) 106, temperature 99.1 F (37.3 C), temperature source Axillary, resp. rate (!) 26, height '5\' 6"'$  (1.676 m), weight 93.5 kg, SpO2 95 %.    Vent Mode: PRVC FiO2 (%):  [40 %-60 %] 50 % Set Rate:  [26 bmp] 26 bmp Vt Set:  [450 mL] 450 mL PEEP:  [10 cmH20] 10 cmH20 Plateau Pressure:  [34 cmH20-39 cmH20] 36 cmH20   Intake/Output Summary (Last 24 hours) at 07/17/2020 0839 Last data filed at 07/17/2020 0600 Gross per 24 hour  Intake 1997.95 ml  Output 1050 ml  Net 947.95 ml   Filed Weights   07/15/20 0500 07/16/20 0500 07/17/20 0500  Weight: 91.3 kg 92.8 kg 93.5 kg    Examination:  Gen:      Chronically-ill appearing male, unresponsive on ventilator  HEENT:  Eyes are closed, mucous membranes are dry Neck:     Trach in place and midline, without secretions Lungs:    Ventilated breaths, mild crackles b/l, trach midline CV:         Irregular rate Abd:      Distended, hypoactive bowel sounds, colostomy bag in place LLQ Ext:   3+ pitting edema b/l lower extremities, 2+ pitting edema of b/l hands, moon boots in place to b/l lower extremities GU: Foley in place, 50 cc  UOP. Scrotal edema Skin:      Warm and dry; no rash Neuro: Unresponsive, quadriplegic   Labs/imaging reviewed Significant for potassium 6.1 (hemolyzed),  phosphorus 8.3, BUN/creatinine 185/1.23, WBC count remains elevated at 36, Hgb stable at 7.8, platelets 565 No new imaging  Resolved Hospital Problem list   Hyperkalemia  Assessment & Plan:  Septic Shock (POA) in setting of HCAP PNA and UTI, present on admission Hx of MDR Klebseilla, MRSA and Pseudomonas.  Hx of pneumonia for which he was being treated at [place]. Procal remains elevated and increased since admission from 5 (6/29) to 11 (7/1). UA consistent with UTI. Significant leukocytosis that is steadily increasing (36k today) but afebrile since admission. Wound care to Sacral ulcer- does not appear to be infected at this time. CXR with multifocal infiltrates. BP remains soft, on midodrine.  Vancomycin d/c 7/1 as blood cultures thought to be contaminant Meropenem changed to Unasyn on 7/2 to cover Acinetobacter, day 3. Off pressors since 7/2. On midodrine 5 mg but BP still soft, increase to 10 mg  Acute on chronic hypoxemic respiratory failure (POA) Acinetobacter in Tracheal Aspirate Likely 2/2 PNA vs hypervolemia as patient has significant pitting edema in bilat lower extremities, scrotal and now upper extremities as well. Weight is up 8lbs since admission. CXR with stable multifocal pneumonia and RUL collapse with bilateral pleural effusions. 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  Continue vent, wean PEEP/FiO2 Follow intermittent chest x-ray Mucomyst, nebs, guaifenesin for mucociliary clearance Chest PT Echo 80 mg Lasix x1   Acute metabolic encephalopathy (POA) 2/2 acidosis vs uremia  Respiratory and metabolic acidosis; slightly improved.  BUN with consistent elevation- 169 today. Remains unresponsive to verbal or physical stimuli.  Monitor, will continue to diurese as above  Iron-Deficiency Anemia and likely Anemia of chronic disease S/p PRBC transfusion 7/2 for hemoglobin of 6.8. Hgb since then has been stable. No evidence of active bleed.   Monitor with CBC Ferrous sulfate daily per tube Consider abdominal imaging if worsens  Severe protein calorie malnutrition (POA) Continue tube feeds  AKI secondary to ATN in the setting of sepsis Hyperkalemia Hyperkalemia, s/p D50/Insulin and additional dose Lokelma. EKG without peaked T-waves visualized. Baseline Cr  0.4, Cr elevated at 1.23 today with BUN 185.  UOP 785 cc. Repeat K every 4 hours Follow urine output and creatinine Consider Nephrology consult if persists/worsens   Thrombocytosis Likely reactive in setting of acute infection, iron-deficiency anemia could also be contributing.  Follow CBC   Hyperphosphatemia Phosphorus continues to persistently elevate daily, now increased to 8.3. Likely result of ATN from septic shock. Calcium 8.3 but corrected to 10.5.  Monitor, diurese as above  DM II Continue Lantus and mSSI  PAF Regularly irregular with PVCs.  Continue amiodarone Switching Xarelto to Heparin gtt   Periorbital Edema / Mild Exophthalmus  TSH is normal Monitor   Sacral decubitus ulcer stage IV (POA) Wound care daily  Goals of Care Patient is chronically-ill, on ventilator with poor prognosis. Labs are continuously worsening. Currently Full Code. Would benefit from Palliative care consult to further establish goals of care.   Best Practice (right click and "Reselect all SmartList Selections" daily)   Diet/type: tubefeeds DVT prophylaxis: DOAC GI prophylaxis: PPI Lines: N/A Foley:  Yes, and it is still needed Code Status:  full code  Critical care time:     Sharion Settler PGY-2 Family Medicine

## 2020-07-17 NOTE — Progress Notes (Signed)
Patient transported to CT and back without complications. RN at bedside.  

## 2020-07-17 NOTE — Progress Notes (Signed)
ANTICOAGULATION CONSULT NOTE - Initial Consult  Pharmacy Consult for Heparin (Xarelto on hold with AKI) Indication: atrial fibrillation  Allergies  Allergen Reactions   Adhesive [Tape] Rash    Patient Measurements: Height: '5\' 6"'$  (167.6 cm) (Simultaneous filing. User may not have seen previous data.) Weight: 93.5 kg (206 lb 2.1 oz) IBW/kg (Calculated) : 63.8 Heparin Dosing Weight: 84 kg  Vital Signs: Temp: 99.1 F (37.3 C) (07/05 0741) Temp Source: Axillary (07/05 0741) BP: 94/58 (07/05 0800) Pulse Rate: 106 (07/05 0800)  Labs: Recent Labs    07/15/20 0120 07/16/20 0430 07/17/20 0057 07/17/20 0432 07/17/20 0624  HGB 8.4* 7.4* 7.8*  --   --   HCT 25.6* 23.3* 23.7*  --   --   PLT 569* 483* 565*  --   --   CREATININE 0.89 1.06 1.19 1.28* 1.23    Estimated Creatinine Clearance: 59.8 mL/min (by C-G formula based on SCr of 1.23 mg/dL).   Medical History: Past Medical History:  Diagnosis Date   Anemia    Anxiety    Chronic respiratory failure (HCC)    DM2 (diabetes mellitus, type 2) (HCC)    Functional quadriplegia (HCC)    Hypertension    Multiple drug resistant organism (MDRO) culture positive    Urine retention    UTI (urinary tract infection)     Assessment: 69 YOM from [facility] on Xarelto for hx Afib. The patient is now noted to be in AKI and the plan is to hold Xarelto and transition to Heparin to avoid accumulation.   Last Xarelto dose lower dose of 15 mg on 7/5 AM @ 0848, will plan to transition to Heparin starting on 7/6 ~24h after the last Xarelto dose.   Goal of Therapy:  Heparin level 0.3-0.7 units/ml aPTT 66-102 seconds Monitor platelets by anticoagulation protocol: Yes   Plan:  - Hold Xarelto with AKI - last dose 7/5 AM - Obtain baseline aPTT/HL with 7/6 AM labs prior to Heparin start - Start Heparin at a rate of 1100 units/hr (11 ml/hr) starting at 1000 on 7/6.  - Will continue to monitor for any signs/symptoms of bleeding and will follow up  with aPTT 8 hours after initiation  Thank you for allowing pharmacy to be a part of this patient's care.  Alycia Rossetti, PharmD, BCPS Clinical Pharmacist Clinical phone for 07/17/2020: [phone] 07/17/2020 9:43 AM   **Pharmacist phone directory can now be found on [email or web address] ([email or web address]).  Listed under [place].

## 2020-07-17 NOTE — Progress Notes (Signed)
  Echocardiogram 2D Echocardiogram has been performed.  Dekisha Mesmer G Gianfranco Araki 07/17/2020, 1:24 PM

## 2020-07-17 NOTE — Progress Notes (Addendum)
[facility] Progress Note Patient Name: MYLON MANALAC DOB: 19-Jul-1950 MRN: [id number]   Date of Service  07/17/2020  HPI/Events of Note  Repeat K 6 UO borderline  eICU Interventions  Repeat hyperkalemia protocol, to include lasix May need nephrology referral  Notified by lab that blood was hemolyzed Hold hyperK protocol     Intervention Category Intermediate Interventions: Electrolyte abnormality - evaluation and management  Raquel Sarna T Shawndell Varas 07/17/2020, 6:00 AM

## 2020-07-17 NOTE — Progress Notes (Signed)
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 Most recent admission September 2021 in setting of sepsis due to MDR Klebsiella with right ureteral calculus s/p double J ureteral stent placement and treated with meropenem x 10days. He was discharged back to [facility].  6/29 patient presented to [facility] via EMS for AMS and hypotension. Per reports, patient is Aox4 at baseline but had developed AMS and had low-grade fevers with hypotension. This is on the background of undergoing pneumonia treatment for approximately one month in [facility] as well as recent worsening of hypertension causing an increase in his routine medications. Upon EMS arrival to [facility], BP 80/50 which did not improve with 1L crystalloid bolus and Levo started en route, requiring uptitration in ED with improvement to 120/82. He was also found to be hypoxemic requiring high percent FiO2 on ventilator. Thick secretions in ET tube suction and cloudy urine noted by ED team. PCCM asked to admit.   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  Anemia  DM II  HTN  Urinary Retention Renal Calculi  Frequent PNA's Chronic [facility] Resident   Significant Hospital Events: Including procedures, antibiotic start and stop dates in addition to other pertinent events   6/29 admitted to ICU for septic shock on levo to maintain MAP>65, continued on vent support   6/29 Blood Cx>> staph species in 1 of 4 cultures drawn>> suspect contaminant 6/30 Off pressors 7/2 ID Consult for MDR acinetobacter in trach asp, Meropenem changed to unasyn  Interim History / Subjective:  No overnight events Hemodynamically stable  Objective   Blood pressure (!) 94/58, pulse (!) 106, temperature 99.1 F (37.3 C), temperature source Axillary, resp. rate (!) 26, height '5\' 6"'$  (1.676 m), weight 93.5 kg, SpO2 90 %.    Vent Mode: PRVC FiO2 (%):  [40 %-60 %] 50 % Set Rate:  [26 bmp] 26 bmp Vt Set:  [450 mL] 450 mL PEEP:  [10 cmH20] 10 cmH20 Plateau Pressure:  [34 cmH20-39 cmH20] 36 cmH20   Intake/Output Summary (Last 24 hours) at 07/17/2020 0858 Last data filed at 07/17/2020 0853 Gross per 24 hour  Intake 2251.54 ml  Output 1110 ml  Net 1141.54 ml   Filed Weights   07/15/20 0500 07/16/20 0500 07/17/20 0500  Weight: 91.3 kg 92.8 kg 93.5 kg    Examination: Blood pressure 106/63, pulse 89, temperature 98 F (36.7 C), temperature source Oral, resp. rate (!) 26, height '5\' 6"'$  (1.676 m), weight 92.8 kg, SpO2 93 %. Gen:      Chronically ill-appearing HEENT: Moist oral mucosa Neck:     Neck is supple, no JVD, no adenopathy  Lungs:    Clear breath sounds bilaterally  CV:         S1-S2 appreciated  Abd:      Bowel sounds appreciated  Ext:    No edema; adequate peripheral perfusion Skin:      Warm and dry; no rash Neuro: Remains unresponsive   Labs/imaging reviewed  Labs  reviewed significant for elevated potassium  Resolved Hospital Problem list   Hyperkalemia  Assessment & Plan:   Septic shock in setting of healthcare associated pneumonia Urinary tract infection present on admission History of MDR Klebsiella Acinetobacter infection  -Currently on Unasyn, changed from meropenem  Hypotension -Off pressors -Still requiring midodrine -Increase dose of midodrine to 10, 3 times daily -Obtain echocardiogram  Acute on chronic hypoxemic respiratory  failure Acinetobacter and tracheal aspirate Multilobar pneumonia -Continue full ventilator support -Continue bronchodilators, mucociliary clearance -Continue chest PT  Acute metabolic encephalopathy secondary to acidosis, uremia -Continue to monitor closely -Correct metabolic derangements as possible  Anemia of chronic illness -Posttransfusion 7/2 -Iron replacement  Severe protein calorie malnutrition -Continue tube feeds  Acute kidney injury secondary to ATN in the setting of sepsis Hypokalemia -Continue Lokelma -Give 80 mg of Lasix today -Continue to monitor output  Thrombocytosis -Reactive  Type 2 diabetes -Lantus and SSI  Paroxysmal atrial fibrillation -We will switch to heparin secondary to renal dysfunction -Continue amiodarone  Periorbital edema/mild exophthalmos -Continue to monitor  Sacral decubitus-stage IV -Continue wound care    Best Practice (right click and "Reselect all SmartList Selections" daily)   Diet/type: tubefeeds DVT prophylaxis: systemic heparin GI prophylaxis: PPI Lines: N/A Foley:  Yes, and it is still needed Code Status:  full code Prognosis is poor overall  Critical care time:     The patient is critically ill with multiple organ systems failure and requires high complexity decision making for assessment and support, frequent evaluation and titration of therapies, application of advanced monitoring technologies and extensive interpretation of multiple databases. Critical Care Time devoted to patient care services described in this note independent of APP/resident time (if applicable)  is 32 minutes.   Sherrilyn Rist MD [facility] Personal pager: [phone] If unanswered, please page CCM On-call: [phone]

## 2020-07-17 NOTE — Progress Notes (Signed)
Assisted tele visit to patient with wife and family member.  Mulltiple issues trying to connect encountered previously.  Spoke with wife via phone to determine factors that were keeping her from connecting to the link that was sent.  Established that she had not accepted the microphone and video access that would allow her to connect.  Explained the steps she need to do.  Call was connect successfully.  Darlys Gales, RN

## 2020-07-18 ENCOUNTER — Inpatient Hospital Stay (HOSPITAL_COMMUNITY): Payer: 59

## 2020-07-18 DIAGNOSIS — Z7189 Other specified counseling: Secondary | ICD-10-CM

## 2020-07-18 DIAGNOSIS — L89004 Pressure ulcer of unspecified elbow, stage 4: Secondary | ICD-10-CM | POA: Diagnosis not present

## 2020-07-18 DIAGNOSIS — N179 Acute kidney failure, unspecified: Secondary | ICD-10-CM | POA: Diagnosis not present

## 2020-07-18 DIAGNOSIS — Z515 Encounter for palliative care: Secondary | ICD-10-CM

## 2020-07-18 DIAGNOSIS — Z789 Other specified health status: Secondary | ICD-10-CM

## 2020-07-18 DIAGNOSIS — J9601 Acute respiratory failure with hypoxia: Secondary | ICD-10-CM | POA: Diagnosis not present

## 2020-07-18 DIAGNOSIS — R4182 Altered mental status, unspecified: Secondary | ICD-10-CM | POA: Diagnosis not present

## 2020-07-18 DIAGNOSIS — I959 Hypotension, unspecified: Secondary | ICD-10-CM | POA: Diagnosis not present

## 2020-07-18 LAB — URINALYSIS, ROUTINE W REFLEX MICROSCOPIC
Bilirubin Urine: NEGATIVE
Glucose, UA: NEGATIVE mg/dL
Hgb urine dipstick: NEGATIVE
Ketones, ur: NEGATIVE mg/dL
Nitrite: NEGATIVE
Protein, ur: NEGATIVE mg/dL
Specific Gravity, Urine: 1.015 (ref 1.005–1.030)
pH: 5 (ref 5.0–8.0)

## 2020-07-18 LAB — RENAL FUNCTION PANEL
Albumin: 1.1 g/dL — ABNORMAL LOW (ref 3.5–5.0)
Anion gap: 14 (ref 5–15)
BUN: 192 mg/dL — ABNORMAL HIGH (ref 8–23)
CO2: 26 mmol/L (ref 22–32)
Calcium: 8 mg/dL — ABNORMAL LOW (ref 8.9–10.3)
Chloride: 106 mmol/L (ref 98–111)
Creatinine, Ser: 1.63 mg/dL — ABNORMAL HIGH (ref 0.61–1.24)
GFR, Estimated: 45 mL/min — ABNORMAL LOW (ref >60)
Glucose, Bld: 173 mg/dL — ABNORMAL HIGH (ref 70–99)
Phosphorus: 9.9 mg/dL — ABNORMAL HIGH (ref 2.5–4.6)
Potassium: 4.8 mmol/L (ref 3.5–5.1)
Sodium: 146 mmol/L — ABNORMAL HIGH (ref 135–145)

## 2020-07-18 LAB — CBC
HCT: 23.3 % — ABNORMAL LOW (ref 39.0–52.0)
Hemoglobin: 7.7 g/dL — ABNORMAL LOW (ref 13.0–17.0)
MCH: 22.6 pg — ABNORMAL LOW (ref 26.0–34.0)
MCHC: 33 g/dL (ref 30.0–36.0)
MCV: 68.5 fL — ABNORMAL LOW (ref 80.0–100.0)
Platelets: 627 10*3/uL — ABNORMAL HIGH (ref 150–400)
RBC: 3.4 MIL/uL — ABNORMAL LOW (ref 4.22–5.81)
RDW: 25.2 % — ABNORMAL HIGH (ref 11.5–15.5)
WBC: 25.9 10*3/uL — ABNORMAL HIGH (ref 4.0–10.5)
nRBC: 1.1 % — ABNORMAL HIGH (ref 0.0–0.2)

## 2020-07-18 LAB — PROTEIN / CREATININE RATIO, URINE
Creatinine, Urine: 15.85 mg/dL
Protein Creatinine Ratio: 2.78 mg/mg{Cre} — ABNORMAL HIGH (ref 0.00–0.15)
Total Protein, Urine: 44 mg/dL

## 2020-07-18 LAB — SODIUM, URINE, RANDOM: Sodium, Ur: 32 mmol/L

## 2020-07-18 LAB — BASIC METABOLIC PANEL
Anion gap: 15 (ref 5–15)
BUN: 190 mg/dL — ABNORMAL HIGH (ref 8–23)
CO2: 26 mmol/L (ref 22–32)
Calcium: 8.4 mg/dL — ABNORMAL LOW (ref 8.9–10.3)
Chloride: 105 mmol/L (ref 98–111)
Creatinine, Ser: 1.34 mg/dL — ABNORMAL HIGH (ref 0.61–1.24)
GFR, Estimated: 57 mL/min — ABNORMAL LOW (ref >60)
Glucose, Bld: 135 mg/dL — ABNORMAL HIGH (ref 70–99)
Potassium: 5 mmol/L (ref 3.5–5.1)
Sodium: 146 mmol/L — ABNORMAL HIGH (ref 135–145)

## 2020-07-18 LAB — APTT
aPTT: 37 seconds — ABNORMAL HIGH (ref 24–36)
aPTT: 76 seconds — ABNORMAL HIGH (ref 24–36)

## 2020-07-18 LAB — GLUCOSE, CAPILLARY
Glucose-Capillary: 154 mg/dL — ABNORMAL HIGH (ref 70–99)
Glucose-Capillary: 135 mg/dL — ABNORMAL HIGH (ref 70–99)
Glucose-Capillary: 159 mg/dL — ABNORMAL HIGH (ref 70–99)
Glucose-Capillary: 181 mg/dL — ABNORMAL HIGH (ref 70–99)
Glucose-Capillary: 197 mg/dL — ABNORMAL HIGH (ref 70–99)

## 2020-07-18 LAB — HEPARIN LEVEL (UNFRACTIONATED)
Heparin Unfractionated: <0.1 IU/mL — ABNORMAL LOW (ref 0.30–0.70)
Heparin Unfractionated: 0.17 IU/mL — ABNORMAL LOW (ref 0.30–0.70)

## 2020-07-18 LAB — POTASSIUM
Potassium: 5 mmol/L (ref 3.5–5.1)
Potassium: 4.7 mmol/L (ref 3.5–5.1)
Potassium: 3.9 mmol/L (ref 3.5–5.1)

## 2020-07-18 LAB — MAGNESIUM: Magnesium: 2.6 mg/dL — ABNORMAL HIGH (ref 1.7–2.4)

## 2020-07-18 LAB — PHOSPHORUS: Phosphorus: 9.4 mg/dL — ABNORMAL HIGH (ref 2.5–4.6)

## 2020-07-18 MED ORDER — PRISMASOL BGK 0/2.5 32-2.5 MEQ/L EC SOLN
Status: DC
  Filled 2020-07-18 (×16): qty 5000

## 2020-07-18 MED ORDER — HEPARIN SODIUM (PORCINE) 1000 UNIT/ML DIALYSIS
1000.0000 [IU] | INTRAMUSCULAR | Status: DC | PRN
  Administered 2020-07-18: 1000 [IU] via INTRAVENOUS_CENTRAL
  Administered 2020-07-26: 4000 [IU] via INTRAVENOUS_CENTRAL
  Administered 2020-07-27: 3000 [IU] via INTRAVENOUS_CENTRAL
  Filled 2020-07-18: qty 3
  Filled 2020-07-18 (×4): qty 6
  Filled 2020-07-18: qty 4

## 2020-07-18 MED ORDER — AMPICILLIN-SULBACTAM SODIUM 3 (2-1) G IJ SOLR
3.0000 g | Freq: Three times a day (TID) | INTRAMUSCULAR | Status: DC
  Administered 2020-07-18 – 2020-07-19 (×3): 3 g via INTRAVENOUS
  Filled 2020-07-18 (×3): qty 8

## 2020-07-18 MED ORDER — FUROSEMIDE 10 MG/ML IJ SOLN
4.0000 mg/h | INTRAVENOUS | Status: DC
  Filled 2020-07-18: qty 20

## 2020-07-18 MED ORDER — B COMPLEX-C PO TABS
1.0000 | ORAL_TABLET | Freq: Every day | ORAL | Status: DC
  Administered 2020-07-18 – 2020-07-30 (×13): 1
  Filled 2020-07-18 (×13): qty 1

## 2020-07-18 MED ORDER — FUROSEMIDE 10 MG/ML IJ SOLN
100.0000 mg | Freq: Once | INTRAVENOUS | Status: AC
  Administered 2020-07-18: 100 mg via INTRAVENOUS
  Filled 2020-07-18: qty 10

## 2020-07-18 MED ORDER — PRISMASOL BGK 4/2.5 32-4-2.5 MEQ/L REPLACEMENT SOLN
Status: DC
  Filled 2020-07-18 (×26): qty 5000

## 2020-07-18 MED ORDER — PROSOURCE TF PO LIQD
90.0000 mL | Freq: Three times a day (TID) | ORAL | Status: DC
  Administered 2020-07-18 – 2020-07-30 (×35): 90 mL
  Filled 2020-07-18 (×35): qty 90

## 2020-07-18 MED ORDER — NOREPINEPHRINE 16 MG/250ML-% IV SOLN
0.0000 ug/min | INTRAVENOUS | Status: DC
  Administered 2020-07-18: 5 ug/min via INTRAVENOUS
  Administered 2020-07-20: 7 ug/min via INTRAVENOUS
  Administered 2020-07-21: 13 ug/min via INTRAVENOUS
  Administered 2020-07-22: 2 ug/min via INTRAVENOUS
  Filled 2020-07-18 (×4): qty 250

## 2020-07-18 MED ORDER — CHLORTHALIDONE 50 MG PO TABS
50.0000 mg | ORAL_TABLET | Freq: Every day | ORAL | Status: DC
  Administered 2020-07-18: 50 mg via ORAL
  Filled 2020-07-18: qty 1

## 2020-07-18 MED ORDER — FUROSEMIDE 10 MG/ML IJ SOLN
40.0000 mg | Freq: Once | INTRAMUSCULAR | Status: AC
  Administered 2020-07-18: 40 mg via INTRAVENOUS
  Filled 2020-07-18: qty 4

## 2020-07-18 MED ORDER — AMIODARONE IV BOLUS ONLY 150 MG/100ML
150.0000 mg | Freq: Once | INTRAVENOUS | Status: AC
  Administered 2020-07-18: 150 mg via INTRAVENOUS
  Filled 2020-07-18: qty 100

## 2020-07-18 MED ORDER — PRISMASOL BGK 0/2.5 32-2.5 MEQ/L EC SOLN
Status: DC
  Filled 2020-07-18 (×3): qty 5000

## 2020-07-18 NOTE — Consult Note (Signed)
Nephrology Consult   Requesting provider: Dr. Ander Slade Service requesting consult: PCCM Reason for consult: AKI   Assessment/Recommendations: Luis Orr is a/an 70 y.o. male with a past medical history of functional quadriplegic, trach and vent dependent, who was admitted for acute encephalopathy, found to be in septic shock requiring pressor due to hospital-acquired pneumonia.  Now found to have an AKI with hyperkalemia.  AKI:  Likely secondary to prerenal/ATN in the setting of septic shock.  Will check renal ultrasound to rule out obstruction.  Also check urine sodiums and UA for sediment analysis.  Patient has minimal urine output overnight, currently diuresing with Lasix drip and chlorthalidone.  He will need CRRT for volume removal and hyperkalemia. We have spoken to patient's wife and she would like to proceed with full scope of treatment.  Palliative care is appropriate given poor prognosis. -Start CRRT -Hold chlorthalidone.  Give Lasix 100 mg once -Monitor Daily I/Os, Daily weight  -Maintain MAP>65 for optimal renal perfusion.  -Avoid further nephrotoxins including NSAIDS, Morphine.  Unless absolutely necessary, avoid CT with contrast and/or MRI with gadolinium.     Anasarca Albumin was very low at 1.3.  Obtain UA and protein/creatinine ratio to rule out nephrotic syndrome.   -Will need CRRT for volume removal. -May benefit from albumin transfusion  Hyperkalemia Due to worsening kidney function.  Has received temporizing measure this morning. -Continue Lokelma  Hyperphosphatemia Secondary to kidney dysfunction.  Corrected calcium within normal limits.  Acute metabolic encephalopathy In the setting of uremia and sepsis.  BNP significantly elevated 190. -CRRT  Septic shock Hospital-acquired pneumonia Continue pressor support with phenylephrine per PCCM -Maintain MAP>65 for optimal renal perfusion.  -Continue Unasyn  Acute on chronic respiratory failure -Ventilator  support -Unasyn for pneumonia -Volume removal with CRRT  Iron deficiency anemia -On oral iron supplements -Transfuse if Hgb < 7  Diabetes Mellitus Type 2  Severe malnutrition On tube feed -Lantus and SSI   Recommendations conveyed to primary service.    [facility] 07/18/2020 10:22 AM   _____________________________________________________________________________________   History of Present Illness: Luis Orr is a/an 70 y.o. male with a past medical history of compressive myelopathy, functional quadriplegia requiring trach/vent, history of MDR Klebsiella, who presents to the hospital for acute encephalopathy and acute respiratory failure, found to be in septic shock requiring pressor support secondary to hospital-acquired pneumonia.  Patient sedated on ventilator.   Medications:  Current Facility-Administered Medications  Medication Dose Route Frequency Provider Last Rate Last Admin   0.9 %  sodium chloride infusion  250 mL Intravenous Continuous Audria Nine, DO   Paused at 07/17/20 1044   0.9 %  sodium chloride infusion  250 mL Intravenous Continuous Olalere, Adewale A, MD 10 mL/hr at 07/18/20 1000 Infusion Verify at 07/18/20 1000   amiodarone (PACERONE) tablet 200 mg  200 mg Per Tube Daily Corey Harold, NP   200 mg at 07/18/20 0909   Ampicillin-Sulbactam (UNASYN) 3 g in sodium chloride 0.9 % 100 mL IVPB  3 g Intravenous Q8H Campbell Riches, MD       chlorhexidine gluconate (MEDLINE KIT) (PERIDEX) 0.12 % solution 15 mL  15 mL Mouth Rinse BID Brand Males, MD   15 mL at 07/18/20 0735   Chlorhexidine Gluconate Cloth 2 % PADS 6 each  6 each Topical Q1200 Brand Males, MD   6 each at 07/18/20 0737   chlorthalidone (HYGROTON) tablet 50 mg  50 mg Oral Daily Laurin Coder, MD  docusate (COLACE) 50 MG/5ML liquid 100 mg  100 mg Per Tube BID Mick Sell, PA-C   100 mg at 07/18/20 9629   feeding supplement (OSMOLITE  1.5 CAL) liquid 1,000 mL  1,000 mL Per Tube Continuous Audria Nine, DO 50 mL/hr at 07/15/20 1531 1,000 mL at 07/15/20 1531   feeding supplement (PROSource TF) liquid 45 mL  45 mL Per Tube QID Audria Nine, DO   45 mL at 07/18/20 0909   fentaNYL (SUBLIMAZE) injection 25-100 mcg  25-100 mcg Intravenous Q2H PRN Ollis, Brandi L, NP       ferrous sulfate 220 (44 Fe) MG/5ML solution 220 mg  220 mg Per Tube Q breakfast Ollis, Brandi L, NP   220 mg at 07/18/20 0741   furosemide (LASIX) 200 mg in dextrose 5 % 100 mL (2 mg/mL) infusion  4 mg/hr Intravenous Continuous Olalere, Adewale A, MD       guaiFENesin (ROBITUSSIN) 100 MG/5ML solution 100 mg  5 mL Per Tube BID Alfredo Martinez, Brandi L, NP   100 mg at 07/18/20 0910   heparin ADULT infusion 100 units/mL (25000 units/270m)  1,100 Units/hr Intravenous Continuous MRolla Flatten RPH 11 mL/hr at 07/18/20 1000 1,100 Units/hr at 07/18/20 1000   insulin aspart (novoLOG) injection 0-15 Units  0-15 Units Subcutaneous Q4H HCorey Harold NP   3 Units at 07/18/20 0735   insulin glargine (LANTUS) injection 8 Units  8 Units Subcutaneous BID Mannam, Praveen, MD   8 Units at 07/18/20 0910   MEDLINE mouth rinse  15 mL Mouth Rinse 10 times per day RBrand Males MD   15 mL at 07/18/20 0910   midodrine (PROAMATINE) tablet 10 mg  10 mg Per Tube TID WC Olalere, Adewale A, MD   10 mg at 07/18/20 0736   multivitamin with minerals tablet 1 tablet  1 tablet Per Tube Daily MAudria Nine DO   1 tablet at 07/18/20 0910   pantoprazole (PROTONIX) injection 40 mg  40 mg Intravenous QHS HCorey Harold NP   40 mg at 07/17/20 2233   phenylephrine CONCENTRATED 1071min sodium chloride 0.9% 25066m0.4mg66m) infusion  0-400 mcg/min Intravenous Titrated Olalere, Adewale A, MD 15 mL/hr at 07/18/20 1000 100 mcg/min at 07/18/20 1000   polyethylene glycol (MIRALAX / GLYCOLAX) packet 17 g  17 g Oral Daily PRN HoffCorey Harold       polyethylene glycol (MIRALAX / GLYCOLAX)  packet 17 g  17 g Per Tube Daily PaynAndres LabrumPA-C   17 g at 07/18/20 0910   sodium chloride flush (NS) 0.9 % injection 10-40 mL  10-40 mL Intracatheter Q12H RamaBrand Males   10 mL at 07/18/20 0911   sodium chloride flush (NS) 0.9 % injection 10-40 mL  10-40 mL Intracatheter PRN RamaBrand Males       sodium zirconium cyclosilicate (LOKELMA) packet 10 g  10 g Per Tube Daily Espinoza, Alejandra, DO   10 g at 07/18/20 0911     ALLERGIES Adhesive [tape]  MEDICAL HISTORY Past Medical History:  Diagnosis Date   Anemia    Anxiety    Chronic respiratory failure (HCC)    DM2 (diabetes mellitus, type 2) (HCC)[place] Functional quadriplegia (HCC)[place] Hypertension    Multiple drug resistant organism (MDRO) culture positive    Urine retention    UTI (urinary tract infection)      SOCIAL HISTORY Social History   Socioeconomic History   Marital status: Married  Spouse name: Not on file   Number of children: Not on file   Years of education: Not on file   Highest education level: Not on file  Occupational History   Not on file  Tobacco Use   Smoking status: Never   Smokeless tobacco: Never  Substance and Sexual Activity   Alcohol use: Not on file   Drug use: Not on file   Sexual activity: Not on file  Other Topics Concern   Not on file  Social History Narrative   Not on file   Social Determinants of Health   Financial Resource Strain: Not on file  Food Insecurity: Not on file  Transportation Needs: Not on file  Physical Activity: Not on file  Stress: Not on file  Social Connections: Not on file  Intimate Partner Violence: Not on file     FAMILY HISTORY No family history on file.   No family history of kidney disease  Review of Systems: 12 systems reviewed Otherwise as per HPI, all other systems reviewed and negative  Physical Exam: Vitals:   07/18/20 1000 07/18/20 1015  BP: 123/60   Pulse: 81 80  Resp: (!) 27 (!) 26  Temp:    SpO2: 92% 95%    Total I/O In: 560.2 [I.V.:210.2; Other:200; NG/GT:150] Out: -   Intake/Output Summary (Last 24 hours) at 07/18/2020 1022 Last data filed at 07/18/2020 1000 Gross per 24 hour  Intake 2649.63 ml  Output 895 ml  Net 1754.63 ml   General: no acute distress, chronically ill HEENT: anicteric sclera CV: regular rate, normal rhythm, no murmurs.  +2-3 anasarca Lungs: On ventilator Abd: Colostomy bag in place Skin: no visible lesions or rashes Psych: Sedated Musculoskeletal: no obvious deformities   Test Results Reviewed Lab Results  Component Value Date   NA 146 (H) 07/18/2020   K 5.0 07/18/2020   CL 105 07/18/2020   CO2 26 07/18/2020   BUN 190 (H) 07/18/2020   CREATININE 1.34 (H) 07/18/2020   CALCIUM 8.4 (L) 07/18/2020   ALBUMIN 1.3 (L) 07/10/2020   PHOS 9.4 (H) 07/18/2020     I have reviewed all relevant outside healthcare records related to the patient's kidney injury.

## 2020-07-18 NOTE — Progress Notes (Signed)
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 Most recent admission September 2021 in setting of sepsis due to MDR Klebsiella with right ureteral calculus s/p double J ureteral stent placement and treated with meropenem x 10days. He was discharged back to [facility].  6/29 patient presented to [facility] via EMS for AMS and hypotension. Per reports, patient is Aox4 at baseline but had developed AMS and had low-grade fevers with hypotension. This is on the background of undergoing pneumonia treatment for approximately one month in [facility] as well as recent worsening of hypertension causing an increase in his routine medications. Upon EMS arrival to [facility], BP 80/50 which did not improve with 1L crystalloid bolus and Levo started en route, requiring uptitration in ED with improvement to 120/82. He was also found to be hypoxemic requiring high percent FiO2 on ventilator. Thick secretions in ET tube suction and cloudy urine noted by ED team. PCCM asked to admit.   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  Anemia  DM II  HTN  Urinary Retention with chronic foley  Renal Calculi  Frequent PNA's Chronic [facility] Resident   Significant Hospital Events: Including procedures, antibiotic start and stop dates in addition to other pertinent events   6/29 admitted to ICU for septic shock on levo to maintain MAP>65,  continued on vent support  6/29 Blood Cx>> staph species in 1 of 4 cultures drawn>> suspect contaminant 6/30 Off pressors 7/2 ID Consult for MDR acinetobacter in trach asp, Meropenem changed to unasyn 7/2 PRBC transfusion, iron replacement  7/5 Pressors added again. Central line placed. Drainage from gastrostomy tube >> CT Abd without fluid collection around gastrostomy tube but with diffuse body wall edema. Echo EF 50-55%, mild LVH   Interim History / Subjective:   On Ventilator, continues to be unresponsive.  Objective   Blood pressure 95/73, pulse (!) 132, temperature 97.8 F (36.6 C), temperature source Oral, resp. rate (!) 26, height '5\' 6"'$  (1.676 m), weight 94.1 kg, SpO2 95 %. CVP:  [3 mmHg-6 mmHg] 6 mmHg  Vent Mode: PRVC FiO2 (%):  [50 %-60 %] 50 % Set Rate:  [26 bmp] 26 bmp Vt Set:  [450 mL] 450 mL PEEP:  [10 cmH20] 10 cmH20 Plateau Pressure:  [33 cmH20-37 cmH20] 37 cmH20   Intake/Output Summary (Last 24 hours) at 07/18/2020 0706 Last data filed at 07/18/2020 0600 Gross per 24 hour  Intake 2558.02 ml  Output 955 ml  Net 1603.02 ml   Filed Weights   07/16/20 0500 07/17/20 0500 07/18/20 0500  Weight: 92.8 kg 93.5 kg 94.1 kg    Examination:  Gen:      Chronically-ill appearing male, intermittently awakens during examination, on ventilator  HEENT:  Eyelids edematous, mucous membranes are dry, sclera anicteric  Neck:     Trach in place and midline, without secretions, right IJ in place and C/D/I Lungs:    Ventilated breaths, diffuse rhonchi, trach midline CV:  Regular rate and rhythm  Abd:      Distended, normoactive bowel sounds, colostomy bag in place LLQ Ext:   3+ pitting edema b/l lower extremities up to thigh, 2+ pitting edema of b/l hands and forearms, moon boots in place to b/l lower extremities GU: Foley in place, 150 cc UOP. Scrotal edema Skin:      Warm and dry; no rash Neuro: Intermittently awakens though unable to follow commands, quadriplegic    Labs/imaging reviewed Na 146, K 5, BUN 190, Cr 1.34, Phos 9.4, Mg 2.6, GFR 57 WBC 25.9, Hgb 7.7, Plts 627 Echo: Technically difficult study. EF 50-55%, mild concentric LVH. No mitral/aortic regurgitation, valves unable to be assessed. Aortic root dilation measuring 40 mm. CT Abd: Gastrostomy tube without evidence of fluid collection. Marked atrophy of right kidney. Patchy and consolidative airspace in both lower lungs, small b/l pleural effusions. Diffuse body wall edema. Small volume ascites.  Resolved Hospital Problem list   Hyperkalemia  Assessment & Plan:  Septic Shock (POA) in setting of HCAP PNA and UTI, present on admission Hx of MDR [place], MRSA and Pseudomonas. Hx of pneumonia for which he was being treated at [place]. Procal remains elevated and increased since admission from 5 (6/29) to 11 (7/1). UA consistent with UTI, urine culture with multiple species present x2. Significant leukocytosis that is slightly improved today (WBC 36>25.9), has remained afebrile since admission.  CXR with b/l pulmonary infiltrates, stable without change. Hypotensive to 70's/50's yesterday and pressors added back. Started on Levophed and transitioned to Neosynephrine given persistent tachycardia. BP still soft, [id number] systolic. Continues to remain tachycardic, HR 130's.   -Vancomycin d/c 7/1 as blood cultures thought to be contaminant -Meropenem changed to Unasyn on 7/2 to cover Acinetobacter, day 4. -Back on pressors since 7/5, currently on Neosynephrine. Also on 10 mg midodrine TID.  -150 mg Amiodarone bolus   Acute on chronic hypoxemic respiratory failure (POA) Acinetobacter in Tracheal Aspirate Likely 2/2 PNA vs hypervolemia as patient has significant pitting edema in bilat lower extremities, scrotal and now upper extremities as well. Weight is up 9lbs since admission. Can trial slow diuresis and see if BP tolerate. Echo demonstrated EF 50-55% with mild concentric LVH and aortic root dilation. CXR  with stable multifocal pneumonia and RUL collapse with bilateral pleural effusions. 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  -Continue vent, wean PEEP/FiO2 -Follow intermittent chest x-ray -Mucomyst, nebs, guaifenesin for mucociliary clearance -Chest PT -Lasix 40 mg x1, then start Lasix drip -Chlorthalidone 50 mg daily  Acute metabolic encephalopathy (POA) 2/2 uremia  BUN with consistent elevation- 169>190 today. Remains unresponsive to verbal or physical stimuli.  -Monitor, diuresis as above  Iron-Deficiency Anemia and likely Anemia of chronic disease S/p PRBC transfusion 7/2 for hemoglobin of 6.8. Hgb since then has been stable. No evidence of active bleed.   -Monitor with CBC -Ferrous sulfate daily per tube  Severe protein calorie malnutrition (POA) -Continue tube feeds  AKI secondary to ATN in the setting of sepsis Baseline Cr 0.4, Cr elevated at 1.34 today with BUN 190.  UOP 580 cc yesterday. Net positive 5.3L since admission. Potassium 5 this AM, still at risk for hyperkalemia given worsened renal function.  Follow urine output and creatinine -Lokelma 10 g daily  -Consult Renal -Diuresis as above  Thrombocytosis Likely reactive in setting of acute infection, iron-deficiency anemia could also be contributing.  Follow CBC   Hyperphosphatemia Phosphorus continues to persistently elevate daily, now increased to 9.4. Likely result of ATN  from septic shock.  -Monitor  DM II -Continue Lantus and mSSI  PAF Regular rate and rhythm today  Continue amiodarone On Heparin gtt given renal function  Periorbital Edema / Mild Exophthalmus  TSH is normal Monitor   Sacral decubitus ulcer stage IV (POA) Wound care daily  Goals of Care Patient is chronically-ill, on ventilator with poor prognosis. Labs are continuously worsening. Currently Full Code. Would benefit from Palliative care consult to further establish goals of care.  -F/u Palliative consult    Best Practice (right click and "Reselect all SmartList Selections" daily)   Diet/type: tubefeeds DVT prophylaxis: DOAC GI prophylaxis: PPI Lines: N/A Foley:  Yes, and it is still needed Code Status:  full code  Critical care time:     Sharion Settler PGY-2 Family Medicine

## 2020-07-18 NOTE — Progress Notes (Signed)
RT attempted CPT X2 and pt unavailable. Will attempt at another time.

## 2020-07-18 NOTE — Consult Note (Signed)
Palliative Care Consult Note                                  Date: 07/18/2020   Patient Name: Luis Orr  DOB: 1950/01/30  MRN: [id number]  Age / Sex: 70 y.o., male  PCP: Townsend Roger, MD Referring Physician: Laurin Coder, MD  Reason for Consultation: Establishing goals of care  HPI/Patient Profile: Palliative Care consult requested for goals of care discussion in this 70 y.o. male  with past medical history of functional quadriplegic d/t compressive C3-C5 myelopathy, trach and vent (2018), diabetes, hypertension, anemia, and anxiety. Patient resides at [facility] facility. He was admitted on 06/28/2020 via EMS to ED from [place] with concerns for hypotension and altered mental status. Per reports at baseline he is alert and oriented x4. Patient was receiving treatment for pneumonia x1 mos. Patient started on IV antibiotics for sepsis in the setting of UTI. Requiring full ventilator support. 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   Past Medical History:  Diagnosis Date   Anemia    Anxiety    Chronic respiratory failure (HCC)    DM2 (diabetes mellitus, type 2) (HCC)    Functional quadriplegia (HCC)    Hypertension    Multiple drug resistant organism (MDRO) culture positive    Urine retention    UTI (urinary tract infection)      Subjective:   This NP Osborne Oman reviewed medical records, received report from team, assessed the patient and then met at the patient's bedside with patient's wife, Luis and son, Luis Orr to discuss diagnosis, prognosis, GOC, EOL wishes disposition and options.   Concept of Palliative Care was introduced as specialized medical care for people and their families living with serious illness.  It focuses on providing relief from the symptoms and stress of a serious illness.  The goal is to improve quality of life for both the patient and the family. Values and goals of care important to  patient and family were attempted to be elicited. Family verbalized understanding and appreciation.   Created space and opportunity for patient  and family to explore thoughts and feelings. Son shares they have recently received updates from attending team.   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 He and his wife have been married for over 32 years and have 2 sons. He enjoyed socializing and spending time with family. He and his family are from [place], [place].   We discussed His current illness and what it means in the larger context of His on-going co-morbidities. Natural disease trajectory and expectations were discussed. Questions and concerns addressed.   Family verbalized their understanding of patient's current illness and prognosis. They are remaining hopeful that he will recover but is also preparing for the worst (further decline or no meaningful recovery). Wife states although patient has suffered significant health challenges over the years he was able to communicate with them and personality was at baseline. She is tearful in expressing if this does not happen she would not wish to see him live in a more non-functional state. Son verbalizes his agreement.   Family is clear they would like to continue to treat the treatable and speak to their agreement to CRRT. They are aware he most likely would not be a long-term HD candidate. Mrs. Favaro states they are willing to at least  try and re-evaluate over time making the best decisions for patient.   I discussed the importance of continued conversation with family and their medical providers regarding overall plan of care and treatment options, ensuring decisions are within the context of the patients values and GOCs.  Questions and concerns were addressed. The family was encouraged to call with questions or concerns.  PMT will continue to support holistically as needed.  Life  Review: Retired [employer]. Married to his wife for more than 78 years with 2 sons. Christian faith.   Patient Values: Patient enjoyed helping others, spending time with his family, and serving God in his local church when he was able to. Continues to have a strong Christian faith.    Objective:   Primary Diagnoses: Present on Admission:  Septic shock ([place])   Scheduled Meds:  amiodarone  200 mg Per Tube Daily   chlorhexidine gluconate (MEDLINE KIT)  15 mL Mouth Rinse BID   Chlorhexidine Gluconate Cloth  6 each Topical Q1200   docusate  100 mg Per Tube BID   feeding supplement (PROSource TF)  45 mL Per Tube QID   ferrous sulfate  220 mg Per Tube Q breakfast   guaiFENesin  5 mL Per Tube BID   insulin aspart  0-15 Units Subcutaneous Q4H   insulin glargine  8 Units Subcutaneous BID   mouth rinse  15 mL Mouth Rinse 10 times per day   midodrine  10 mg Per Tube TID WC   multivitamin with minerals  1 tablet Per Tube Daily   pantoprazole (PROTONIX) IV  40 mg Intravenous QHS   polyethylene glycol  17 g Per Tube Daily   sodium chloride flush  10-40 mL Intracatheter Q12H   sodium zirconium cyclosilicate  10 g Per Tube Daily    Continuous Infusions:   prismasol BGK 4/2.5     sodium chloride Stopped (07/17/20 1044)   sodium chloride Stopped (07/18/20 1250)   ampicillin-sulbactam (UNASYN) IV Stopped (07/18/20 1223)   feeding supplement (OSMOLITE 1.5 CAL) 1,000 mL (07/18/20 1156)   heparin 1,100 Units/hr (07/18/20 1300)   phenylephrine (NEO-SYNEPHRINE) Adult infusion 125 mcg/min (07/18/20 1300)   prismasol BGK 2/2.5 dialysis solution     prismasol BGK 2/2.5 replacement solution      PRN Meds: fentaNYL (SUBLIMAZE) injection, heparin, polyethylene glycol, sodium chloride flush  Allergies  Allergen Reactions   Adhesive [Tape] Rash    Review of Systems  Unable to perform ROS: Acuity of condition   Physical Exam General: NAD, frail quadriplegic, chronically-ill  appearing Cardiovascular: Irregularly irregular Pulmonary: rhonchi, ventilator, trachea  Abdomen: soft, distended, nontender, + bowel sounds, PEG, colostomy  Extremities: bilateral lower extremity edema  Neurological: will open eyes to verbal commands, squeezes hand  Vital Signs:  BP 124/75   Pulse 84   Temp 98.5 F (36.9 C) (Axillary)   Resp (!) 26   Ht 5' 6"  (1.676 m) Comment: Simultaneous filing. User may not have seen previous data.  Wt 94.1 kg   SpO2 93%   BMI 33.48 kg/m  Pain Scale: CPOT      SpO2: SpO2: 93 % O2 Device:SpO2: 93 % O2 Flow Rate: .   IO: Intake/output summary:  Intake/Output Summary (Last 24 hours) at 07/18/2020 1443 Last data filed at 07/18/2020 1300 Gross per 24 hour  Intake 2868.6 ml  Output 1065 ml  Net 1803.6 ml    LBM: Last BM Date: 07/18/20 Baseline Weight: Weight: 90.2 kg Most recent weight: Weight: 94.1 kg  Palliative Assessment/Data: Trach/Vent/TF   Advanced Care Planning:   Primary Decision Maker: NEXT OF KIN Wife-Luis Orr   Code Status/Advance Care Planning: Limited code  A discussion was had today regarding advanced directives. Concepts specific to code status,  continued IV antibiotics and rehospitalization was had.    We discussed patient's full code status at length with concerns for his current illness and co-morbidities. Education provided on what a cardiopulmonary event would look like for patient. Wife and son both mutually agreed that patient's quality of life is important and they would not wish to see it decline and he survive any further. They request for no CPR or defibrillation with understanding he is currently on ventilator support. Education provided on what this would look like in an event. Family verbalized understanding confirming wishes.   The difference between a aggressive medical intervention path and a palliative comfort care path was discussed. Wife states they wish to continue to give him every  opportunity to improve and thrive, in the event this does not happen they would then consider focusing on his comfort.    Decisions/Changes to ACP: LIMITED CODE-No compressions or defibrillation  Assessment & Plan:   SUMMARY OF RECOMMENDATIONS   Limited Code (no compressions or defibrillation)-as requested and confirmed by wife with son's support. If medications does not aid in patient's recovery they would wish for natural death with understanding he is currently on ventilator support.  Continue with current plan of care, treat the treatable, in agreement with CRRT trial.  Family is clear in expressed wishes to allow patient every opportunity to thrive. They are remaining hopeful but also preparing for the worst (further decline or no improvement). If all efforts are not successful they would then consider focusing on his comfort.  PMT will continue to support and follow as needed. Please call team line with urgent needs.  Symptom Management:  Per Attending  Palliative Prophylaxis:  Aspiration, Bowel Regimen, Eye Care, Frequent Pain Assessment, Oral Care, Palliative Wound Care, and Turn Reposition  Additional Recommendations (Limitations, Scope, Preferences): No compression, no defibrillation, continue to treat the treatable  Psycho-social/Spiritual:  Desire for further Chaplaincy support: no Additional Recommendations:  ongoing support   Prognosis:  Guarded-Poor  Discharge Planning:  To Be Determined depending on hospital course. Family confirms patient is from [place].   Discussed with: Dr. Ander Slade  Wife (Luis) and son Luis Orr) expressed understanding and was in agreement with this plan.   Time In: 1300 Time Out: 1350 Time Total: 50 min.   Visit consisted of counseling and education dealing with the complex and emotionally intense issues of symptom management and palliative care in the setting of serious and potentially life-threatening illness.Greater than 50%  of this  time was spent counseling and coordinating care related to the above assessment and plan.  Signed by:  Alda Lea, AGPCNP-BC Palliative Medicine Team  Phone: [phone] Pager: [phone] Amion: Bjorn Pippin   Thank you for allowing the Palliative Medicine Team to assist in the care of this patient. Please utilize secure chat with additional questions, if there is no response within 30 minutes please call the above phone number. Palliative Medicine Team providers are available by phone from 7am to 5pm daily and can be reached through the team cell phone.  Should this patient require assistance outside of these hours, please call the patient's attending physician.

## 2020-07-18 NOTE — Procedures (Signed)
Central Venous Catheter Insertion Procedure Note  SIRCHARLES SANDVIK  IG:[id number]  [birth year]  Date:07/18/20  Time:2:07 PM   Provider Performing:Tai Skelly A Lindee Leason   Procedure: Insertion of Non-tunneled Central Venous Catheter(36556)without US guidance  Line exchange over guidewire  Indication(s) Hemodialysis  Consent Risks of the procedure as well as the alternatives and risks of each were explained to the patient and/or caregiver.  Consent for the procedure was obtained and is signed in the bedside chart  Anesthesia local  Timeout Verified patient identification, verified procedure, site/side was marked, verified correct patient position, special equipment/implants available, medications/allergies/relevant history reviewed, required imaging and test results available.  Sterile Technique Maximal sterile technique including full sterile barrier drape, hand hygiene, sterile gown, sterile gloves, mask, hair covering, sterile ultrasound probe cover (if used).  Procedure Description Area of catheter insertion was cleaned with chlorhexidine and draped in sterile fashion.   Without real-time ultrasound guidance a HD catheter was placed into the right internal jugular vein.  Nonpulsatile blood flow and easy flushing noted in all ports.  The catheter was sutured in place and sterile dressing applied.  Complications/Tolerance None; patient tolerated the procedure well. Chest X-ray is ordered to verify placement for internal jugular or subclavian cannulation.  Chest x-ray is not ordered for femoral cannulation.  EBL Minimal  Specimen(s) None

## 2020-07-18 NOTE — Progress Notes (Signed)
Nutrition Follow-up  DOCUMENTATION CODES:   Not applicable  INTERVENTION:   Continue Osmolite 1.5 at 50 ml/h via PEG (1200 ml per day). Increase Prosource TF to 90 ml TID via PEG.  Provides: 2040 kcal, 141 gm protein, 914 ml free water daily.  D/C MVI with minerals, change to B-complex with vitamin C daily via tube.  NUTRITION DIAGNOSIS:   Increased nutrient needs related to wound healing as evidenced by estimated needs.  Ongoing   GOAL:   Patient will meet greater than or equal to 90% of their needs  Met with TF  MONITOR:   Vent status, Labs, Weight trends, TF tolerance, Skin, I & O's  REASON FOR ASSESSMENT:   Ventilator, Consult Enteral/tube feeding initiation and management  ASSESSMENT:   70 year old male who presented to the ED on 6/29 from [facility] with AMS and hypotension. 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 Pt admitted with septic shock, acute on chronic hypoxemic respiratory failure.  Discussed patient in ICU rounds and with RN today. Receiving neosynephrine. Ongoing AKI likely r/t ATN. Weight trending up with positive volume status. Plans to begin CRRT today.   Receiving Osmolite 1.5 at 50 ml/h with Prosource TF 45 ml QID via PEG to provide 1960 kcal, 119 gm protein, 914 ml free water daily. Receiving MVI with minerals via tube; will change to B-complex with vitamin C while on CRRT.  Patient is currently intubated on ventilator support MV: 12.1 L/min Temp (24hrs), Avg:98.1 F (36.7 C), Min:97.5 F (36.4 C), Max:98.6 F (37 C)   Admission weight 90.2 kg Current weight 94.1 kg  I/O +6.1 L since admission UOP 580 ml x 24 hours Colostomy output 365 ml x 24 hours  Labs reviewed. Na 146, phos 9.4, mag 2.6 CBG: 154-135  Medications reviewed and include colace, ferrous  sulfate, novolog, Lantus, MVI with minerals, Protonix, Miralax, Lokelma, phenylephrine.   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  Estimated Nutritional Needs:   Kcal:  1900-2100  Protein:  125-145 gm  Fluid:  >/= 2.0 L    Lucas Mallow, RD, LDN, CNSC Please refer to Amion for contact information.

## 2020-07-18 NOTE — Progress Notes (Addendum)
ANTICOAGULATION CONSULT NOTE - Follow Up Consult  Pharmacy Consult for Heparin (Xarelto on hold with AKI) Indication: atrial fibrillation  Allergies  Allergen Reactions   Adhesive [Tape] Rash    Patient Measurements: Height: '5\' 6"'$  (167.6 cm) (Simultaneous filing. User may not have seen previous data.) Weight: 94.1 kg (207 lb 7.3 oz) IBW/kg (Calculated) : 63.8 Heparin Dosing Weight: 84 kg  Vital Signs: Temp: 97.5 F (36.4 C) (07/06 1948) Temp Source: Oral (07/06 1948) BP: 101/67 (07/06 1940) Pulse Rate: (P) 70 (07/06 2100)  Labs: Recent Labs    07/16/20 0430 07/17/20 0057 07/17/20 0432 07/17/20 0624 07/18/20 0250 07/18/20 1512 07/18/20 2108 07/18/20 2113  HGB 7.4* 7.8*  --   --  7.7*  --   --   --   HCT 23.3* 23.7*  --   --  23.3*  --   --   --   PLT 483* 565*  --   --  627*  --   --   --   APTT  --   --   --   --  37*  --  76*  --   HEPARINUNFRC  --   --   --   --  <0.10*  --   --  0.17*  CREATININE 1.06 1.19   < > 1.23 1.34* 1.63*  --   --    < > = values in this interval not displayed.    Estimated Creatinine Clearance: 45.3 mL/min (A) (by C-G formula based on SCr of 1.63 mg/dL (H)).  Medical History: Past Medical History:  Diagnosis Date   Anemia    Anxiety    Chronic respiratory failure (HCC)    DM2 (diabetes mellitus, type 2) (HCC)    Functional quadriplegia (HCC)    Hypertension    Multiple drug resistant organism (MDRO) culture positive    Urine retention    UTI (urinary tract infection)     Assessment: 70 yr old man from [facility], on Xarelto for hx Afib. The patient is now noted to be in AKI and the plan is to hold Xarelto and transition to IV heparin to avoid Xarelto accumulation.   Last Xarelto dose (lower dose of 15 mg) was on 7/5 AM at 0848; transitioned to IV heparin starting on 7/6 ~24h after the last Xarelto dose. Baseline heparin level was undetectable, baseline aPTT was 37 sec.  Initial heparin level ~12 hrs after starting heparin  infusion at 1100 units/hr was 0.17 units/ml, which is below the goal range for this pt. aPTT drawn at the same time was 76 sec; will monitor anticoagulation using heparin level since other factors may interfere with aPTT and baseline heparin level was undetectable. H/H 7.7/23.3 (stable), plt 627. Per RN, no issues with IV or bleeding observed.  Goal of Therapy:  Heparin level: 0.3-0.7 units/ml Monitor platelets by anticoagulation protocol: Yes   Plan:  Increase heparin infusion to 1350 units/hr Check heparin level in 8 hrs Monitor daily heparin level, CBC Monitor for bleeding  Gillermina Hu, PharmD, BCPS, [facility] Clinical Pharmacist 07/18/2020 9:56 PM

## 2020-07-19 DIAGNOSIS — J9601 Acute respiratory failure with hypoxia: Secondary | ICD-10-CM | POA: Diagnosis not present

## 2020-07-19 DIAGNOSIS — R4182 Altered mental status, unspecified: Secondary | ICD-10-CM | POA: Diagnosis not present

## 2020-07-19 DIAGNOSIS — A419 Sepsis, unspecified organism: Secondary | ICD-10-CM | POA: Diagnosis not present

## 2020-07-19 DIAGNOSIS — I959 Hypotension, unspecified: Secondary | ICD-10-CM | POA: Diagnosis not present

## 2020-07-19 LAB — POCT I-STAT 7, (LYTES, BLD GAS, ICA,H+H)
Acid-Base Excess: 2 mmol/L (ref 0.0–2.0)
Bicarbonate: 28.9 mmol/L — ABNORMAL HIGH (ref 20.0–28.0)
Calcium, Ion: 1.15 mmol/L (ref 1.15–1.40)
HCT: 23 % — ABNORMAL LOW (ref 39.0–52.0)
Hemoglobin: 7.8 g/dL — ABNORMAL LOW (ref 13.0–17.0)
O2 Saturation: 95 %
Patient temperature: 97.3
Potassium: 3.7 mmol/L (ref 3.5–5.1)
Sodium: 143 mmol/L (ref 135–145)
TCO2: 31 mmol/L (ref 22–32)
pCO2 arterial: 58.9 mmHg — ABNORMAL HIGH (ref 32.0–48.0)
pH, Arterial: 7.295 — ABNORMAL LOW (ref 7.350–7.450)
pO2, Arterial: 81 mmHg — ABNORMAL LOW (ref 83.0–108.0)

## 2020-07-19 LAB — RENAL FUNCTION PANEL
Albumin: 1.1 g/dL — ABNORMAL LOW (ref 3.5–5.0)
Albumin: 1.2 g/dL — ABNORMAL LOW (ref 3.5–5.0)
Anion gap: 9 (ref 5–15)
Anion gap: 6 (ref 5–15)
BUN: 125 mg/dL — ABNORMAL HIGH (ref 8–23)
BUN: 69 mg/dL — ABNORMAL HIGH (ref 8–23)
CO2: 26 mmol/L (ref 22–32)
CO2: 28 mmol/L (ref 22–32)
Calcium: 7.6 mg/dL — ABNORMAL LOW (ref 8.9–10.3)
Calcium: 7.7 mg/dL — ABNORMAL LOW (ref 8.9–10.3)
Chloride: 104 mmol/L (ref 98–111)
Chloride: 103 mmol/L (ref 98–111)
Creatinine, Ser: 0.89 mg/dL (ref 0.61–1.24)
Creatinine, Ser: 0.56 mg/dL — ABNORMAL LOW (ref 0.61–1.24)
GFR, Estimated: >60 mL/min (ref >60)
GFR, Estimated: >60 mL/min (ref >60)
Glucose, Bld: 202 mg/dL — ABNORMAL HIGH (ref 70–99)
Glucose, Bld: 157 mg/dL — ABNORMAL HIGH (ref 70–99)
Phosphorus: 6.2 mg/dL — ABNORMAL HIGH (ref 2.5–4.6)
Phosphorus: 5 mg/dL — ABNORMAL HIGH (ref 2.5–4.6)
Potassium: 3.6 mmol/L (ref 3.5–5.1)
Potassium: 3.4 mmol/L — ABNORMAL LOW (ref 3.5–5.1)
Sodium: 139 mmol/L (ref 135–145)
Sodium: 137 mmol/L (ref 135–145)

## 2020-07-19 LAB — CBC
HCT: 20.9 % — ABNORMAL LOW (ref 39.0–52.0)
Hemoglobin: 7 g/dL — ABNORMAL LOW (ref 13.0–17.0)
MCH: 22.7 pg — ABNORMAL LOW (ref 26.0–34.0)
MCHC: 33.5 g/dL (ref 30.0–36.0)
MCV: 67.9 fL — ABNORMAL LOW (ref 80.0–100.0)
Platelets: 492 10*3/uL — ABNORMAL HIGH (ref 150–400)
RBC: 3.08 MIL/uL — ABNORMAL LOW (ref 4.22–5.81)
RDW: 26 % — ABNORMAL HIGH (ref 11.5–15.5)
WBC: 19.4 10*3/uL — ABNORMAL HIGH (ref 4.0–10.5)
nRBC: 1.9 % — ABNORMAL HIGH (ref 0.0–0.2)

## 2020-07-19 LAB — GLUCOSE, CAPILLARY
Glucose-Capillary: 186 mg/dL — ABNORMAL HIGH (ref 70–99)
Glucose-Capillary: 206 mg/dL — ABNORMAL HIGH (ref 70–99)
Glucose-Capillary: 233 mg/dL — ABNORMAL HIGH (ref 70–99)
Glucose-Capillary: 234 mg/dL — ABNORMAL HIGH (ref 70–99)
Glucose-Capillary: 154 mg/dL — ABNORMAL HIGH (ref 70–99)
Glucose-Capillary: 146 mg/dL — ABNORMAL HIGH (ref 70–99)

## 2020-07-19 LAB — HEPARIN LEVEL (UNFRACTIONATED)
Heparin Unfractionated: 0.16 IU/mL — ABNORMAL LOW (ref 0.30–0.70)
Heparin Unfractionated: 0.51 IU/mL (ref 0.30–0.70)

## 2020-07-19 LAB — CK: Total CK: 25 U/L — ABNORMAL LOW (ref 49–397)

## 2020-07-19 LAB — HEMOGLOBIN AND HEMATOCRIT, BLOOD
HCT: 27.9 % — ABNORMAL LOW (ref 39.0–52.0)
Hemoglobin: 9.4 g/dL — ABNORMAL LOW (ref 13.0–17.0)

## 2020-07-19 LAB — POTASSIUM: Potassium: 4.1 mmol/L (ref 3.5–5.1)

## 2020-07-19 LAB — PREPARE RBC (CROSSMATCH)

## 2020-07-19 LAB — MAGNESIUM: Magnesium: 2.4 mg/dL (ref 1.7–2.4)

## 2020-07-19 MED ORDER — SODIUM CHLORIDE 0.9% IV SOLUTION: Freq: Once | INTRAVENOUS | Status: DC

## 2020-07-19 MED ORDER — SODIUM CHLORIDE 0.9 % IV SOLN
3.0000 g | Freq: Four times a day (QID) | INTRAVENOUS | Status: AC
  Administered 2020-07-19 – 2020-07-27 (×35): 3 g via INTRAVENOUS
  Filled 2020-07-19 (×36): qty 8

## 2020-07-19 MED ORDER — PRISMASOL BGK 4/2.5 32-4-2.5 MEQ/L REPLACEMENT SOLN
Status: DC
  Filled 2020-07-19 (×6): qty 5000

## 2020-07-19 MED ORDER — PRISMASOL BGK 4/2.5 32-4-2.5 MEQ/L EC SOLN
Status: DC
  Filled 2020-07-19 (×64): qty 5000

## 2020-07-19 NOTE — Progress Notes (Signed)
PHARMACY NOTE:  ANTIMICROBIAL RENAL DOSAGE ADJUSTMENT  Current antimicrobial regimen includes a mismatch between antimicrobial dosage and estimated renal function.  As per policy approved by the Pharmacy & Therapeutics and Medical Executive Committees, the antimicrobial dosage will be adjusted accordingly.  Current antimicrobial dosage:  Unasyn 3g IV every 8 hours  Indication: Acinetobacter Pneumonia  Renal Function:  Estimated Creatinine Clearance: 82.6 mL/min (by C-G formula based on SCr of 0.89 mg/dL). '[]'$      On intermittent HD, scheduled: '[x]'$      On CRRT - running well    Antimicrobial dosage has been changed to:  Increase to Unasyn 3g IV every 6 hours while on CRRT due to need for high dose therapy with high MIC.   Additional comments: Will monitor CRRT therapy and adjust Unasyn if issues with CRRT toleration.    Thank you for allowing pharmacy to be a part of this patient's care.  Sloan Leiter, PharmD, BCPS, BCCCP Clinical Pharmacist Please refer to [facility] for [place] numbers 07/19/2020 8:48 AM

## 2020-07-19 NOTE — Progress Notes (Signed)
Assisted tele visit to patient with wife.  Collene Schlichter, RN

## 2020-07-19 NOTE — Progress Notes (Signed)
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 Most recent admission September 2021 in setting of sepsis due to MDR Klebsiella with right ureteral calculus s/p double J ureteral stent placement and treated with meropenem x 10days. He was discharged back to [facility].  6/29 patient presented to [facility] via EMS for AMS and hypotension. Per reports, patient is Aox4 at baseline but had developed AMS and had low-grade fevers with hypotension. This is on the background of undergoing pneumonia treatment for approximately one month in [facility] as well as recent worsening of hypertension causing an increase in his routine medications. Upon EMS arrival to [facility], BP 80/50 which did not improve with 1L crystalloid bolus and Levo started en route, requiring uptitration in ED with improvement to 120/82. He was also found to be hypoxemic requiring high percent FiO2 on ventilator. Thick secretions in ET tube suction and cloudy urine noted by ED team. PCCM asked to admit.   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  Anemia  DM II  HTN  Urinary Retention with chronic foley  Renal Calculi  Frequent PNA's Chronic [facility] Resident   Significant Hospital Events: Including procedures, antibiotic start and stop dates in addition to other pertinent events   6/29 admitted to ICU for septic shock on levo to maintain MAP>65,  continued on vent support  6/29 Blood Cx>> staph species in 1 of 4 cultures drawn>> suspect contaminant 6/30 Off pressors 7/2 ID Consult for MDR acinetobacter in trach asp, Meropenem changed to unasyn 7/2 PRBC transfusion, iron replacement  7/5 Pressors added again. Central line placed. Drainage from gastrostomy tube >> CT Abd without fluid collection around gastrostomy tube but with diffuse body wall edema. Echo EF 50-55%, mild LVH  7/6 Renal Replacement Therapy ; Code status changed to partial Code  7/7 Transfuse 1U PRBC  Interim History / Subjective:   On Ventilator and CRRT, awake and more alert.   Objective   Blood pressure 102/71, pulse 70, temperature (!) 94.6 F (34.8 C), temperature source Oral, resp. rate (!) 31, height '5\' 6"'$  (1.676 m), weight 93.2 kg, SpO2 94 %.    Vent Mode: PRVC FiO2 (%):  [50 %] 50 % Set Rate:  [26 bmp-35 bmp] 35 bmp Vt Set:  [380 mL-450 mL] 380 mL PEEP:  [10 cmH20] 10 cmH20 Plateau Pressure:  [20 cmH20-37 cmH20] 35 cmH20   Intake/Output Summary (Last 24 hours) at 07/19/2020 [id number] Last data filed at 07/19/2020 0700 Gross per 24 hour  Intake 2539 ml  Output 3548 ml  Net -1009 ml   Filed Weights   07/17/20 0500 07/18/20 0500 07/19/20 0338  Weight: 93.5 kg 94.1 kg 93.2 kg    Examination:  Gen:      Chronically-ill appearing quadraplegic male, awake, alert, on ventilator, on CRRT HEENT:  Eyelids edematous, mucous membranes are dry, sclera anicteric  Neck:     Trach in place and midline, without secretions, right IJ HD cath in place and C/D/I Lungs:  Ventilated breaths, diffuse rhonchi, trach midline CV:         Irregularly irregular rate and rhythm  Abd:      Distended, normoactive bowel sounds, colostomy bag in place LLQ Ext:   2+ pitting edema b/l lower extremities up to thigh, moon boots in place to b/l lower extremities GU: Foley in place, scrotal edema Skin:      Warm and dry; no rash Neuro: Awake, alert, able to follow simple commands such  as closing eyes, raising eyebrows, smiling  Labs/imaging reviewed Na 139, K 3.6, Phos 6.2, Alb 1.1, Creat 0.89, BUN 125 CK 25 WBC 19.4, Hgb 7, Platelets 492 Renal U/S: mild left hydronephrosis, multiple left renal cysts, 1.7 cm right simple renal cyst, questionable gallstones vs hypoproteinemic state leading to gallbladder wall thickening. Mild ascites.   Resolved Hospital Problem list   Hyperkalemia Tachycardia  Assessment & Plan:  Septic Shock (POA) in setting of HCAP PNA and UTI, present on admission Hx of MDR Klebseilla, MRSA and Pseudomonas. Hx of pneumonia for which he was being treated at [facility]. Procal remains elevated and increased since admission from 5 (6/29) to 11 (7/1). UA on admission consistent with UTI. Repeat UA 7/6 consistent with continued UTI. Leukocytosis is improving (WBC 36>25.9>19.4), has remained afebrile since admission.  CXR with b/l pulmonary infiltrates, stable without change. Still hypotensive, [id number] systolic. On Levophed. Tachycardia resolved.  -Vancomycin d/c 7/1 as blood cultures thought to be contaminant -Meropenem changed to Unasyn on 7/2 to cover Acinetobacter, day 6. -Back on pressors since 7/5, currently on Levophed. Also on 10 mg midodrine TID.   Acute on chronic hypoxemic respiratory failure (POA) Acinetobacter in Tracheal Aspirate Likely 2/2 PNA and hypervolemia as patient has diffuse anasarca. S/p diuresis and CRRT yesterday, weight is down 2 lbs since yesterday. Echo with EF 50-55% with mild concentric LVH and aortic root dilation. CXR with stable multifocal pneumonia and RUL collapse with bilateral pleural effusions. 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  -Continue vent, wean PEEP/FiO2 -Follow intermittent chest x-ray -Mucomyst, nebs, guaifenesin for mucociliary clearance -Chest PT -Unasyn as above- will plan for 10-14 day total course, day 6 today.  Acute metabolic encephalopathy (POA) 2/2 uremia  BUN with  improvement since CRRT- 190>125 today. More alert today, able to follow simple commands.  -Renal consulted, appreciate care and recommendations  Iron-Deficiency Anemia and likely Anemia of chronic disease S/p PRBC transfusion 7/2 for hemoglobin of 6.8. Hgb 7 this AM. Transfusion threshold <7, given severe illness, will transfuse. On iron supplementation.  -Will transfuse 1U PRBC -Ferrous sulfate daily per tube  Severe protein calorie malnutrition (POA) -Continue tube feeds  AKI secondary to ATN in the setting of sepsis Improving. Started CRRT yesterday. Baseline Cr 0.4, Cr improved from 1.63 to 0.89 today with BUN 125  UOP 875 cc yesterday, 2.3L removed from CRRT. Net positive 4.3L since admission.  -Renal on board, appreciate care and recommendations -CRRT  -Follow urine output and creatinine -Stop Lokelma  Thrombocytosis Likely reactive in setting of acute infection, iron-deficiency anemia could also be contributing.  Follow CBC   Hyperphosphatemia Improved since initiating CRRT.  -Monitor  DM II -Continue Lantus and mSSI  PAF S/p Amiodarone bolus for tachycardia on 7/6. Improved HR.  On anticoagulation. Continue amiodarone PT On Heparin gtt given renal function  Periorbital Edema / Mild Exophthalmus  TSH is normal Monitor   Sacral decubitus ulcer stage IV (POA) Wound care daily  Goals of Care Patient is chronically-ill, on ventilator with  poor prognosis. Code status changed from full to partial yesterday. Would benefit from Palliative care consult for continuous goals of care discussions with family.   -F/u Palliative consult   Best Practice (right click and "Reselect all SmartList Selections" daily)   Diet/type: tubefeeds DVT prophylaxis: systemic heparin GI prophylaxis: PPI Lines: N/A Foley:  Yes, and it is still needed Code Status:  limited  Critical care time:     Sharion Settler PGY-2 Family Medicine

## 2020-07-19 NOTE — Progress Notes (Signed)
ANTICOAGULATION CONSULT NOTE - Follow Up Consult  Pharmacy Consult for Heparin (Xarelto on hold with AKI) Indication: atrial fibrillation  Allergies  Allergen Reactions   Adhesive [Tape] Rash    Patient Measurements: Height: '5\' 6"'$  (167.6 cm) (Simultaneous filing. User may not have seen previous data.) Weight: 93.2 kg (205 lb 7.5 oz) IBW/kg (Calculated) : 63.8 Heparin Dosing Weight: 84 kg  Vital Signs: Temp: 94.6 F (34.8 C) (07/07 0335) Temp Source: Oral (07/07 0335) BP: 100/65 (07/07 0759) Pulse Rate: 76 (07/07 0759)  Labs: Recent Labs    07/17/20 0057 07/17/20 0432 07/18/20 0250 07/18/20 1512 07/18/20 2108 07/18/20 2113 07/19/20 0016 07/19/20 0233 07/19/20 0234 07/19/20 0650  HGB 7.8*  --  7.7*  --   --   --  7.8* 7.0*  --   --   HCT 23.7*  --  23.3*  --   --   --  23.0* 20.9*  --   --   PLT 565*  --  627*  --   --   --   --  492*  --   --   APTT  --   --  37*  --  76*  --   --   --   --   --   HEPARINUNFRC  --   --  <0.10*  --   --  0.17*  --   --   --  0.16*  CREATININE 1.19   < > 1.34* 1.63*  --   --   --   --  0.89  --   CKTOTAL  --   --   --   --   --   --   --   --  25*  --    < > = values in this interval not displayed.   Estimated Creatinine Clearance: 82.6 mL/min (by C-G formula based on SCr of 0.89 mg/dL).  Medical History: Past Medical History:  Diagnosis Date   Anemia    Anxiety    Chronic respiratory failure (HCC)    DM2 (diabetes mellitus, type 2) (HCC)    Functional quadriplegia (HCC)    Hypertension    Multiple drug resistant organism (MDRO) culture positive    Urine retention    UTI (urinary tract infection)     Assessment: 70 yr old man from [facility], on Xarelto for hx Afib. The patient is now noted to be in AKI and the plan is to hold Xarelto and transition to IV heparin to avoid Xarelto accumulation. Last Xarelto dose (lower dose of 15 mg) was on 7/5 AM at 0848; transitioned to IV heparin starting on 7/6 ~24h after the last Xarelto  dose. Baseline heparin level was undetectable, baseline aPTT was 37 sec. Initial heparin level ~12 hrs after starting heparin infusion at 1100 units/hr was 0.17 units/ml, which is below the goal range for this pt. aPTT drawn at the same time was 76 sec; will monitor anticoagulation using heparin level since other factors may interfere with aPTT and baseline heparin level was undetectable.   Heparin level this AM low at 0.16 - patient was initiated on CRRT on 7/6. H/H down at 7/20.9, Platelets down from 627 to 492.  Per RN, no issues with PIV heparin is infusing through or bleeding observed. IJ catheter issues this AM noted to be leaking but this has been fixed and heparin not infusing through this catheter.   Goal of Therapy:  Heparin level: 0.3-0.7 units/ml Monitor platelets by anticoagulation protocol: Yes  Plan:  Increase heparin infusion to 1600 units/hr Check heparin level in 8 hrs Monitor daily heparin level, CBC Monitor for bleeding  Sloan Leiter, PharmD, BCPS, BCCCP Clinical Pharmacist Please refer to [facility] for [place] numbers 07/19/2020 8:09 AM

## 2020-07-19 NOTE — Progress Notes (Signed)
   Daily Progress Note   Patient Name: Luis Orr       Date: 07/19/2020 DOB: November 10, 1950  Age: 70 y.o. MRN#: [id number] Attending Physician: Laurin Coder, MD Primary Care Physician: Townsend Roger, MD Admit Date: 06/21/2020  Reason for Consultation/Follow-up: Establishing goals of care  Subjective: Chart Reviewed. Patient Assessed.   Patient more awake and alert today. No family at the bedside.   Called and spoke with wife. She is appreciative of his response on today. States family is remaining hopeful. Reviewed previously discussed goals of care. Wife confirms goals remain to continue to aggressively treat as family is remaining hopeful.   They will continue to evaluate and make decisions as needed. Appreciative of the support and care.   All questions answered support provided.    Length of Stay: 8 days  Vital Signs: BP 113/65   Pulse 71   Temp (!) 94.4 F (34.7 C) (Rectal)   Resp (!) 35   Ht '5\' 6"'$  (1.676 m) Comment: Simultaneous filing. User may not have seen previous data.  Wt 93.2 kg   SpO2 98%   BMI 33.16 kg/m  SpO2: SpO2: 98 % O2 Device: O2 Device: Ventilator O2 Flow Rate: O2 Flow Rate (L/min): 50 L/min  Physical Exam: NAD, chronically ill appearing, much more awake and alert Trach midline, right IJ, remains on vent support Irregularly irregular            Palliative Care Assessment & Plan  HPI: Palliative Care consult requested for goals of care discussion in this 70 y.o. male  with past medical history of functional quadriplegic d/t compressive C3-C5 myelopathy, trach and vent (2018), diabetes, hypertension, anemia, and anxiety. Patient resides at [facility] facility. He was admitted on 06/17/2020 via EMS to ED from [place] with concerns for hypotension and altered mental status. Per reports at baseline he is alert and oriented x4. Patient was receiving treatment for pneumonia x1 mos. Patient started on IV antibiotics for sepsis in the setting of UTI.  Requiring full ventilator support. 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   Code Status: Limited code  Goals of Care/Recommendations: Continue to treat the treatable. Family is remaining hopeful.  PMT will continue to shadow and engage as needed in the setting of clear goals.   Prognosis: Guarded   Discharge Planning: To Be Determined  Thank you for allowing the Palliative Medicine Team to assist in the care of this patient.  Time Total: 35 min.   Visit consisted of counseling and education dealing with the complex and emotionally intense issues of symptom management and palliative care in the setting of serious and potentially life-threatening illness.Greater than 50%  of this time was spent counseling and coordinating care related to the above assessment and plan.  Alda Lea, AGPCNP-BC  Palliative Medicine Team [phone]

## 2020-07-19 NOTE — Progress Notes (Signed)
ANTICOAGULATION CONSULT NOTE - Follow Up Consult  Pharmacy Consult for Heparin (Xarelto on hold with AKI) Indication: atrial fibrillation  Allergies  Allergen Reactions   Adhesive [Tape] Rash    Patient Measurements: Height: '5\' 6"'$  (167.6 cm) (Simultaneous filing. User may not have seen previous data.) Weight: 93.2 kg (205 lb 7.5 oz) IBW/kg (Calculated) : 63.8 Heparin Dosing Weight: 84 kg  Vital Signs: Temp: 97.5 F (36.4 C) (07/07 1600) Temp Source: Oral (07/07 1600) BP: 93/56 (07/07 1815) Pulse Rate: 73 (07/07 1926)  Labs: Recent Labs    07/17/20 0057 07/17/20 0432 07/18/20 0250 07/18/20 1512 07/18/20 2108 07/18/20 2113 07/19/20 0016 07/19/20 0233 07/19/20 0234 07/19/20 0650 07/19/20 1908  HGB 7.8*  --  7.7*  --   --   --  7.8* 7.0*  --   --  9.4*  HCT 23.7*  --  23.3*  --   --   --  23.0* 20.9*  --   --  27.9*  PLT 565*  --  627*  --   --   --   --  492*  --   --   --   APTT  --   --  37*  --  76*  --   --   --   --   --   --   HEPARINUNFRC  --    < > <0.10*  --   --  0.17*  --   --   --  0.16* 0.51  CREATININE 1.19   < > 1.34* 1.63*  --   --   --   --  0.89  --   --   CKTOTAL  --   --   --   --   --   --   --   --  25*  --   --    < > = values in this interval not displayed.    Estimated Creatinine Clearance: 82.6 mL/min (by C-G formula based on SCr of 0.89 mg/dL).  Medical History: Past Medical History:  Diagnosis Date   Anemia    Anxiety    Chronic respiratory failure (HCC)    DM2 (diabetes mellitus, type 2) (HCC)    Functional quadriplegia (HCC)    Hypertension    Multiple drug resistant organism (MDRO) culture positive    Urine retention    UTI (urinary tract infection)     Assessment: 70 yr old man from [facility], on Xarelto for hx Afib. The patient is now noted to be in AKI and the plan is to hold Xarelto and transition to IV heparin to avoid Xarelto accumulation. Last Xarelto dose (lower dose of 15 mg) was on 7/5 AM at 0848; transitioned to IV  heparin starting on 7/6 ~24h after the last Xarelto dose. Baseline heparin level was undetectable, baseline aPTT was 37 sec. Initial heparin level ~12 hrs after starting heparin infusion at 1100 units/hr was 0.17 units/ml, which is below the goal range for this pt. aPTT drawn at the same time was 76 sec; will monitor anticoagulation using heparin level since other factors may interfere with aPTT and baseline heparin level was undetectable.   Heparin level this evening is therapeutic after a rate increase earlier today (HL 0.51 << 0.16, goal of 0.3-0.7). The level was delayed due to PRBC administration for Hgb 7 - now up to 9.4, slow trend down, no active bleeding noted.   Goal of Therapy:  Heparin level: 0.3-0.7 units/ml Monitor platelets by anticoagulation protocol: Yes  Plan:  - Continue Heparin at 1600 units/hr - Will continue to monitor for any signs/symptoms of bleeding and will follow up with heparin level in the a.m. to confirm therapeutic  Thank you for allowing pharmacy to be a part of this patient's care.  Alycia Rossetti, PharmD, BCPS Clinical Pharmacist Clinical phone for 07/19/2020: [phone] 07/19/2020 7:52 PM   **Pharmacist phone directory can now be found on [email or web address] ([email or web address]).  Listed under [place].

## 2020-07-19 NOTE — Progress Notes (Addendum)
[facility] Progress Note    Assessment/ Plan:   AKI -baseline Cr 0.4. AKI likely secondary to ATN in the context of shock. CRRT initiated on 7/6 for anasarca with ineffective diuresis and uremia. Appreciate assistance from CCM for rij temp line placement 7/6 -Not on an ideal long term candidate for HD due to poor functional status, vent dependency. I voiced my concerns to both son and wife over the phone on 7/6 and they had wished to still proceed forward with renal replacement therapy -Continue with CRRT in the interim, will aim more so to be net neg 100cc/hr -Avoid nephrotoxic medications including NSAIDs and iodinated intravenous contrast exposure unless the latter is absolutely indicated.  Preferred narcotic agents for pain control are hydromorphone, fentanyl, and methadone. Morphine should not be used. Avoid Baclofen and avoid oral sodium phosphate and magnesium citrate based laxatives / bowel preps. Continue strict Input and Output monitoring. Will monitor the patient closely with you and intervene or adjust therapy as indicated by changes in clinical status/labs   Anasarca -CRRT as above, aiming for net neg 100cc/hr provided there are no inc'ed pressor requirements  Hyperkalemia -managing with renal replacement therapy, improved  CKD-MBD, hyperphosphatemia -managing with crrt, continue to monitor phos  Acute metabolic encephalopathy, likely related to sepsis and uremia -crrt as above, apparently alert and oriented at baseline.  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 No acute events, phenylephrine off. Has been waking up and trying to mouth words. Now DNR. Tolerating crrt   Objective:   BP  (!) 89/66   Pulse 70   Temp (!) 94.6 F (34.8 C) (Oral)   Resp (!) 35   Ht 5' 6"  (1.676 m) Comment: Simultaneous filing. User may not have seen previous data.  Wt 93.2 kg   SpO2 94%   BMI 33.16 kg/m   Intake/Output Summary (Last 24 hours) at 07/19/2020 0759 Last data filed at 07/19/2020 0700 Gross per 24 hour  Intake 2539 ml  Output 3548 ml  Net -1009 ml   Weight change: -0.9 kg  Physical Exam: JAS:[id number] ill appearing CVS:s1s2, rrr Resp:trach PFX:TKWI OXB:[id number] Neuro: asleep, hard to arouse, quadriplegic   Imaging: CT ABDOMEN PELVIS WO CONTRAST  Result Date: 07/17/2020 CLINICAL DATA:  Abdominal distension and drainage around gastrostomy tube. EXAM: CT ABDOMEN AND PELVIS WITHOUT CONTRAST TECHNIQUE: Multidetector CT imaging of the abdomen and pelvis was performed following the standard protocol without IV contrast. COMPARISON:  10/07/2019 FINDINGS: Lower chest: Patchy and consolidative airspace disease is identified in both lower lungs suggesting multifocal pneumonia. Small bilateral pleural effusions evident. Hepatobiliary: No focal abnormality in the liver on this study without intravenous contrast. High attenuation material in the lumen of the gallbladder is compatible with sludge or stones. No intrahepatic or extrahepatic biliary dilation. Pancreas: No focal mass lesion. No dilatation of the main duct. No intraparenchymal cyst. No peripancreatic edema. Spleen: No splenomegaly. No focal mass lesion. Adrenals/Urinary Tract: No adrenal nodule or mass. Right kidney is markedly atrophic with internal ureteral stent in situ. Proximal loop is formed in the posterior aspect of the upper pole, presumably in an upper pole calyx although this cannot be confirmed on today's study. Distal loop is formed in the urinary bladder. Stone along the mid right ureter (  56/3 and sagittal 94/7 could be in the ureter or adjacent gonadal vein. Multiple left renal cysts noted with nonobstructing left renal  stones. Dominant cyst measures 8.8 cm. Inferior septation in this cyst seen on previous contrast infused imaging is not apparent on today's noncontrast study. No left hydroureter. Bladder is decompressed by Foley catheter. Stomach/Bowel: Gastrostomy tube noted. No evidence for fluid collection around the gastrostomy tube. Stomach is decompressed. Duodenum is normally positioned as is the ligament of Treitz. No small bowel wall thickening. No small bowel dilatation. The terminal ileum is normal. The appendix is normal. Colon is decompressed. 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 Vascular/Lymphatic: There is abdominal aortic atherosclerosis without aneurysm. There is no gastrohepatic or hepatoduodenal ligament lymphadenopathy. No retroperitoneal or mesenteric lymphadenopathy. No pelvic sidewall lymphadenopathy. Reproductive: The prostate gland and seminal vesicles are unremarkable. Other: Small volume fluid noted adjacent to the liver. Small volume fluid noted left pelvis. Musculoskeletal: Diffuse body wall edema. No worrisome lytic or sclerotic osseous abnormality. Midline wound identified posterior to the inferior sacrum with packing material evident. IMPRESSION: 1. Gastrostomy tube noted without evidence for fluid collection around the gastrostomy tube. 2. Marked atrophy of the right kidney with internal ureteral stent in situ. Proximal loop is formed in the posterior aspect of the upper pole, presumably in an upper pole calyx although this cannot be confirmed on today's study. 3. Stone along the mid right ureter could be in the ureter or adjacent gonadal vein. 4. Patchy and consolidative airspace disease in both lower lungs suggesting multifocal pneumonia. Small bilateral pleural effusions. 5. Small volume ascites. 6. Diffuse body wall edema. 7. Aortic Atherosclerosis (ICD10-I70.0). Electronically Signed   By: Misty Stanley M.D.   On: 07/17/2020 11:31   US RENAL  Result Date:  07/18/2020 CLINICAL DATA:  Acute renal insufficiency. EXAM: RENAL / URINARY TRACT ULTRASOUND COMPLETE COMPARISON:  CT 07/17/2020. FINDINGS: Right Kidney: Renal measurements: 9.4 x 4.2 x 5.4 cm = volume: 110.7 mL. Right kidney is echogenic. Atrophy right kidney again noted. 1.7 cm simple cyst. No hydronephrosis visualized. Previously noted right renal stent not visualized by ultrasound. Left Kidney: Renal measurements: 16.6 x 11.8 x 8.3 cm = volume: 851.9 mL. Echogenicity within normal limits. Multiple simple cysts, the largest measures 10.6 cm. Mild left hydronephrosis. Bladder: Not visualized. Other: Incidental note is made of questionable gallstones. Gallbladder wall is thickened to 3.6 mm. This could be secondary to cholecystitis or hypoproteinemic states. Mild ascites noted. IMPRESSION: 1. Right renal atrophy is again noted. Previously noted right ureteral stent not visualized by ultrasound. 1.7 cm simple right renal cyst. 2. Multiple simple left renal cysts, the largest measures 10.6 cm. Similar findings noted on prior exam. Mild left hydronephrosis. Bladder is not visualized. 3. Incidental note is made of questionable gallstones. Gallbladder wall is thickened at 3.6 mm. Cholecystitis cannot be excluded. Hypoproteinemic states can also present this fashion. Mild ascites also incidentally noted. Electronically Signed   By: Marcello Moores  Register   On: 07/18/2020 14:49   DG Chest Port 1 View  Result Date: 07/18/2020 CLINICAL DATA:  Status post central line placement. EXAM: PORTABLE CHEST 1 VIEW COMPARISON:  Single-view of the chest 07/17/2020. FINDINGS: Tracheostomy tube is unchanged. Right IJ catheter has been removed. The patient has a new double lumen right IJ catheter with its tip projecting in the mid to lower superior vena cava. Diffuse bilateral airspace disease and pleural effusions appear unchanged. No pneumothorax. Heart size is normal. IMPRESSION: New dialysis catheter tip projects in  the mid to lower  superior vena cava. No pneumothorax. No change in bilateral effusions and airspace disease. Electronically Signed   By: Inge Rise M.D.   On: 07/18/2020 14:46   DG CHEST PORT 1 VIEW  Result Date: 07/17/2020 CLINICAL DATA:  Central line. EXAM: PORTABLE CHEST 1 VIEW COMPARISON:  Chest x-ray 07/15/2020. FINDINGS: Right IJ line noted with tip over cavoatrial junction. Tracheostomy tube in stable position. Heart size stable. Diffuse bilateral pulmonary infiltrates/edema again noted without interim change. Small bilateral pleural effusions may be present. No pneumothorax. IMPRESSION: 1. Right IJ line with noted with tip over cavoatrial junction. Tracheostomy tube in stable position. 2. Diffuse bilateral pulmonary infiltrates/edema again noted without interim change. Small bilateral pleural effusions may be present. Electronically Signed   By: Marcello Moores  Register   On: 07/17/2020 15:27   ECHOCARDIOGRAM COMPLETE  Result Date: 07/17/2020    ECHOCARDIOGRAM REPORT   Patient Name:   Luis Orr Date of Exam: 07/17/2020 Medical Rec #:  [id number]         Height:       66.0 in Accession #:    [id number]        Weight:       206.1 lb Date of Birth:  [birth year]        BSA:          2.026 m Patient Age:    4 years          BP:           92/55 mmHg Patient Gender: M                 HR:           85 bpm. Exam Location:  Inpatient Procedure: 2D Echo, Cardiac Doppler, Color Doppler and Intracardiac            Opacification Agent Indications:    Edema 782.3  History:        Patient has no prior history of Echocardiogram examinations.                 Risk Factors:Hypertension and Diabetes.  Sonographer:    Tiffany Dance Referring Phys: [id number] [facility]  Sonographer Comments: Technically difficult study due to poor echo windows, no subcostal window, echo performed with patient supine and on artificial respirator and no parasternal window. IMPRESSIONS  1. Very technically difficulat study. In one view (contrast A4C) the  right ventricle appears moderately enlarged with reduced function. Left ventricular ejection fraction, by estimation, is 50 to 55%. The left ventricle has low normal function. Left ventricular endocardial border not optimally defined to evaluate regional wall motion, even with contrast administration. There is mild concentric left ventricular hypertrophy. Left ventricular diastolic parameters are indeterminate.  2. The mitral valve was not assessed. No evidence of mitral valve regurgitation.  3. The aortic valve was not assessed. Aortic valve regurgitation is not visualized.  4. Aortic dilatation noted. There is mild dilatation of the aortic root, measuring 40 mm. Comparison(s): No prior Echocardiogram. Conclusion(s)/Recommendation(s): Consider repeat study when able to (non-supine) in clinically indicated. FINDINGS  Left Ventricle: Left ventricular ejection fraction, by estimation, is 50 to 55%. The left ventricle has low normal function. Left ventricular endocardial border not optimally defined to evaluate regional wall motion. Definity contrast agent was given IV  to delineate the left ventricular endocardial borders. The left ventricular internal cavity size was normal in size. There is mild concentric left ventricular hypertrophy. Left ventricular diastolic parameters are  indeterminate. Right Ventricle: The right ventricular size is moderately enlarged. Right vetricular wall thickness was not assessed. Right ventricular systolic function is moderately reduced. Left Atrium: Left atrial size was normal in size. Right Atrium: Right atrial size was normal in size. Pericardium: There is no evidence of pericardial effusion. Mitral Valve: The mitral valve was not assessed. No evidence of mitral valve regurgitation. Tricuspid Valve: The tricuspid valve is not assessed. Tricuspid valve regurgitation is not demonstrated. Aortic Valve: The aortic valve was not assessed. Aortic valve regurgitation is not visualized.  Pulmonic Valve: The pulmonic valve was not assessed. Pulmonic valve regurgitation is not visualized. Aorta: Aortic dilatation noted. There is mild dilatation of the aortic root, measuring 40 mm. IAS/Shunts: The interatrial septum was not assessed.  LEFT VENTRICLE PLAX 2D LVIDd:         3.90 cm  Diastology LVIDs:         2.90 cm  LV e' medial:    5.11 cm/s LV PW:         1.20 cm  LV E/e' medial:  14.5 LV IVS:        1.10 cm  LV e' lateral:   4.90 cm/s LVOT diam:     2.10 cm  LV E/e' lateral: 15.1 LV SV:         54 LV SV Index:   27 LVOT Area:     3.46 cm  RIGHT VENTRICLE RV Basal diam:  2.50 cm RV S prime:     9.57 cm/s TAPSE (M-mode): 1.9 cm LEFT ATRIUM           Index      RIGHT ATRIUM          Index LA diam:      3.20 cm 1.58 cm/m RA Area:     9.68 cm LA Vol (A4C): 7.3 ml  3.59 ml/m RA Volume:   19.70 ml 9.73 ml/m  AORTIC VALVE LVOT Vmax:   85.50 cm/s LVOT Vmean:  58.500 cm/s LVOT VTI:    0.156 m  AORTA Ao Root diam: 4.10 cm MITRAL VALVE MV Area (PHT): 4.15 cm    SHUNTS MV Decel Time: 183 msec    Systemic VTI:  0.16 m MV E velocity: 74.10 cm/s  Systemic Diam: 2.10 cm MV A velocity: 54.00 cm/s MV E/A ratio:  1.37 Rudean Haskell MD Electronically signed by Rudean Haskell MD Signature Date/Time: 07/17/2020/3:38:41 PM    Final     Labs: BMET Recent Labs  Lab 07/13/20 1656 07/14/20 3354 07/15/20 0120 07/16/20 0430 07/17/20 0057 07/17/20 5625 07/17/20 6389 07/17/20 1146 07/18/20 0250 07/18/20 3734 07/18/20 1106 07/18/20 1512 07/18/20 2108 07/19/20 0016 07/19/20 0234 07/19/20 0650  NA  --  137   < > 142 144 142 143  --  146*  --   --  146*  --  143 139  --   K  --  5.6*   < > 5.6* 5.9* 6.0* 6.1*   < > 5.0 5.0 4.7 4.8 3.9 3.7 3.6 4.1  CL  --  103   < > 108 105 105 105  --  105  --   --  106  --   --  104  --   CO2  --  26   < > 26 27 27 25   --  26  --   --  26  --   --  26  --   GLUCOSE  --  256*   < > 181*  138* 145* 136*  --  135*  --   --  173*  --   --  202*  --   BUN  --   132*   < > 157* 169* 170* 185*  --  190*  --   --  192*  --   --  125*  --   CREATININE  --  1.15   < > 1.06 1.19 1.28* 1.23  --  1.34*  --   --  1.63*  --   --  0.89  --   CALCIUM  --  7.5*   < > 8.4* 8.5* 8.4* 8.3*  --  8.4*  --   --  8.0*  --   --  7.6*  --   PHOS 4.8* 5.5*  --  7.0* 8.3*  --   --   --  9.4*  --   --  9.9*  --   --  6.2*  --    < > = values in this interval not displayed.   CBC Recent Labs  Lab 07/16/20 0430 07/17/20 0057 07/18/20 0250 07/19/20 0016 07/19/20 0233  WBC 34.6* 36.0* 25.9*  --  19.4*  HGB 7.4* 7.8* 7.7* 7.8* 7.0*  HCT 23.3* 23.7* 23.3* 23.0* 20.9*  MCV 69.1* 69.3* 68.5*  --  67.9*  PLT 483* 565* 627*  --  492*    Medications:     amiodarone  200 mg Per Tube Daily   B-complex with vitamin C  1 tablet Per Tube Daily   chlorhexidine gluconate (MEDLINE KIT)  15 mL Mouth Rinse BID   Chlorhexidine Gluconate Cloth  6 each Topical Q1200   docusate  100 mg Per Tube BID   feeding supplement (PROSource TF)  90 mL Per Tube TID   ferrous sulfate  220 mg Per Tube Q breakfast   guaiFENesin  5 mL Per Tube BID   insulin aspart  0-15 Units Subcutaneous Q4H   insulin glargine  8 Units Subcutaneous BID   mouth rinse  15 mL Mouth Rinse 10 times per day   midodrine  10 mg Per Tube TID WC   pantoprazole (PROTONIX) IV  40 mg Intravenous QHS   polyethylene glycol  17 g Per Tube Daily   sodium chloride flush  10-40 mL Intracatheter Q12H   sodium zirconium cyclosilicate  10 g Per Tube Daily      Gean Quint, MD [facility] 07/19/2020, 7:59 AM

## 2020-07-20 DIAGNOSIS — R6521 Severe sepsis with septic shock: Secondary | ICD-10-CM | POA: Diagnosis not present

## 2020-07-20 DIAGNOSIS — A419 Sepsis, unspecified organism: Secondary | ICD-10-CM | POA: Diagnosis not present

## 2020-07-20 LAB — GLUCOSE, CAPILLARY
Glucose-Capillary: 171 mg/dL — ABNORMAL HIGH (ref 70–99)
Glucose-Capillary: 214 mg/dL — ABNORMAL HIGH (ref 70–99)
Glucose-Capillary: 198 mg/dL — ABNORMAL HIGH (ref 70–99)
Glucose-Capillary: 146 mg/dL — ABNORMAL HIGH (ref 70–99)
Glucose-Capillary: 136 mg/dL — ABNORMAL HIGH (ref 70–99)
Glucose-Capillary: 141 mg/dL — ABNORMAL HIGH (ref 70–99)

## 2020-07-20 LAB — CBC
HCT: 28.9 % — ABNORMAL LOW (ref 39.0–52.0)
Hemoglobin: 9.4 g/dL — ABNORMAL LOW (ref 13.0–17.0)
MCH: 23.6 pg — ABNORMAL LOW (ref 26.0–34.0)
MCHC: 32.5 g/dL (ref 30.0–36.0)
MCV: 72.6 fL — ABNORMAL LOW (ref 80.0–100.0)
Platelets: 407 10*3/uL — ABNORMAL HIGH (ref 150–400)
RBC: 3.98 MIL/uL — ABNORMAL LOW (ref 4.22–5.81)
RDW: 26 % — ABNORMAL HIGH (ref 11.5–15.5)
WBC: 29.6 10*3/uL — ABNORMAL HIGH (ref 4.0–10.5)
nRBC: 4.2 % — ABNORMAL HIGH (ref 0.0–0.2)

## 2020-07-20 LAB — RENAL FUNCTION PANEL
Albumin: 1.2 g/dL — ABNORMAL LOW (ref 3.5–5.0)
Albumin: 1.3 g/dL — ABNORMAL LOW (ref 3.5–5.0)
Anion gap: 7 (ref 5–15)
Anion gap: 10 (ref 5–15)
BUN: 53 mg/dL — ABNORMAL HIGH (ref 8–23)
BUN: 44 mg/dL — ABNORMAL HIGH (ref 8–23)
CO2: 26 mmol/L (ref 22–32)
CO2: 19 mmol/L — ABNORMAL LOW (ref 22–32)
Calcium: 7.8 mg/dL — ABNORMAL LOW (ref 8.9–10.3)
Calcium: 7.7 mg/dL — ABNORMAL LOW (ref 8.9–10.3)
Chloride: 103 mmol/L (ref 98–111)
Chloride: 105 mmol/L (ref 98–111)
Creatinine, Ser: 0.51 mg/dL — ABNORMAL LOW (ref 0.61–1.24)
Creatinine, Ser: 0.41 mg/dL — ABNORMAL LOW (ref 0.61–1.24)
GFR, Estimated: >60 mL/min (ref >60)
GFR, Estimated: >60 mL/min (ref >60)
Glucose, Bld: 248 mg/dL — ABNORMAL HIGH (ref 70–99)
Glucose, Bld: 163 mg/dL — ABNORMAL HIGH (ref 70–99)
Phosphorus: 4.4 mg/dL (ref 2.5–4.6)
Phosphorus: 4 mg/dL (ref 2.5–4.6)
Potassium: 3.7 mmol/L (ref 3.5–5.1)
Potassium: 4.5 mmol/L (ref 3.5–5.1)
Sodium: 136 mmol/L (ref 135–145)
Sodium: 134 mmol/L — ABNORMAL LOW (ref 135–145)

## 2020-07-20 LAB — TYPE AND SCREEN
ABO/RH(D): O POS
Antibody Screen: NEGATIVE
Unit division: 0

## 2020-07-20 LAB — BPAM RBC
Blood Product Expiration Date: 202207142359
ISSUE DATE / TIME: 202207071259
Unit Type and Rh: 5100

## 2020-07-20 LAB — MAGNESIUM: Magnesium: 2.3 mg/dL (ref 1.7–2.4)

## 2020-07-20 LAB — HEPARIN LEVEL (UNFRACTIONATED): Heparin Unfractionated: 0.62 IU/mL (ref 0.30–0.70)

## 2020-07-20 LAB — CORTISOL: Cortisol, Plasma: 14.9 ug/dL

## 2020-07-20 MED ORDER — INSULIN ASPART 100 UNIT/ML IJ SOLN
2.0000 [IU] | Freq: Four times a day (QID) | INTRAMUSCULAR | Status: DC
  Administered 2020-07-20 (×2): 2 [IU] via SUBCUTANEOUS

## 2020-07-20 MED ORDER — INSULIN GLARGINE 100 UNIT/ML ~~LOC~~ SOLN
12.0000 [IU] | Freq: Two times a day (BID) | SUBCUTANEOUS | Status: DC
  Administered 2020-07-20 – 2020-07-23 (×7): 12 [IU] via SUBCUTANEOUS
  Filled 2020-07-20 (×8): qty 0.12

## 2020-07-20 MED ORDER — MIDODRINE HCL 5 MG PO TABS
10.0000 mg | ORAL_TABLET | Freq: Three times a day (TID) | ORAL | Status: DC
  Administered 2020-07-20 – 2020-07-23 (×8): 10 mg
  Filled 2020-07-20 (×8): qty 2

## 2020-07-20 MED ORDER — PANTOPRAZOLE SODIUM 40 MG PO PACK
40.0000 mg | PACK | Freq: Every day | ORAL | Status: DC
  Administered 2020-07-20 – 2020-09-26 (×68): 40 mg
  Filled 2020-07-20 (×70): qty 20

## 2020-07-20 MED ORDER — ALBUMIN HUMAN 25 % IV SOLN
25.0000 g | Freq: Four times a day (QID) | INTRAVENOUS | Status: AC
  Administered 2020-07-20 – 2020-07-21 (×2): 25 g via INTRAVENOUS
  Filled 2020-07-20 (×2): qty 100

## 2020-07-20 MED ORDER — ALBUMIN HUMAN 25 % IV SOLN
25.0000 g | Freq: Once | INTRAVENOUS | Status: AC
  Administered 2020-07-20: 25 g via INTRAVENOUS
  Filled 2020-07-20: qty 100

## 2020-07-20 MED ORDER — BUPROPION HCL 75 MG PO TABS
75.0000 mg | ORAL_TABLET | Freq: Two times a day (BID) | ORAL | Status: DC
  Administered 2020-07-20 – 2020-10-20 (×183): 75 mg
  Filled 2020-07-20 (×193): qty 1

## 2020-07-20 NOTE — Progress Notes (Signed)
[facility] Progress Note    Assessment/ Plan:   AKI -baseline Cr 0.4. AKI likely secondary to ATN in the context of shock. CRRT initiated on 7/6 for anasarca with ineffective diuresis and uremia. Appreciate assistance from CCM for rij temp line placement 7/6 -Not on an ideal long term candidate for HD due to poor functional status, vent dependency. I voiced my concerns to both son and wife over the phone on 7/6 and they had wished to still proceed forward with renal replacement therapy. Appreciate palliative care's assistance -Continue with CRRT, will aim more so to be net neg 100cc/hr. All bags 4k/2.5cal, K 3.7 today. -Avoid nephrotoxic medications including NSAIDs and iodinated intravenous contrast exposure unless the latter is absolutely indicated.  Preferred narcotic agents for pain control are hydromorphone, fentanyl, and methadone. Morphine should not be used. Avoid Baclofen and avoid oral sodium phosphate and magnesium citrate based laxatives / bowel preps. Continue strict Input and Output monitoring. Will monitor the patient closely with you and intervene or adjust therapy as indicated by changes in clinical status/labs   Anasarca -CRRT as above, aiming for net neg 100cc/hr provided there are no inc'ed pressor requirements, persistent anasarca present at the moment  Hyperkalemia -managing with renal replacement therapy, improved/resolved. Now on all 4k bags  CKD-MBD, hyperphosphatemia -managing with crrt, continue to monitor phos  Acute metabolic encephalopathy, likely related to sepsis and uremia -crrt as above, apparently alert and oriented at baseline.  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 No acute events, on levo. Running net neg around 100cc/hr. Uop 155cc. Fio2 stable 50%   Objective:   BP 101/83   Pulse 69   Temp (!) 95 F (35 C) Comment: blankets applied and bair hugger temp increased  Resp (!) 35   Ht 5' 6"  (1.676 m) Comment: Simultaneous filing. User may not have seen previous data.  Wt 92.1 kg   SpO2 100%   BMI 32.77 kg/m   Intake/Output Summary (Last 24 hours) at 07/20/2020 8413 Last data filed at 07/20/2020 0800 Gross per 24 hour  Intake 3064.55 ml  Output 5761 ml  Net -2696.45 ml   Weight change: -1.1 kg  Physical Exam: KGM:[id number] ill appearing CVS:s1s2, rrr Resp:trach, bl chest expansion QIH:KVQQ VZD:GLOVFIEP Neuro: asleep, hard to arouse, quadriplegic   Imaging: US RENAL  Result Date: 07/18/2020 CLINICAL DATA:  Acute renal insufficiency. EXAM: RENAL / URINARY TRACT ULTRASOUND COMPLETE COMPARISON:  CT 07/17/2020. FINDINGS: Right Kidney: Renal measurements: 9.4 x 4.2 x 5.4 cm = volume: 110.7 mL. Right kidney is echogenic. Atrophy right kidney again noted. 1.7 cm simple cyst. No hydronephrosis visualized. Previously noted right renal stent not visualized by ultrasound. Left Kidney: Renal measurements: 16.6 x 11.8 x 8.3 cm = volume: 851.9 mL. Echogenicity within normal limits. Multiple simple cysts, the largest measures 10.6 cm. Mild left hydronephrosis. Bladder: Not visualized. Other: Incidental note is made of questionable gallstones. Gallbladder wall is thickened to 3.6 mm. This could be secondary to cholecystitis or hypoproteinemic states. Mild ascites noted. IMPRESSION: 1. Right renal atrophy is again noted. Previously noted right ureteral stent not visualized by ultrasound. 1.7 cm simple right renal cyst. 2. Multiple simple left renal cysts, the largest measures 10.6 cm. Similar findings noted on prior exam.  Mild left hydronephrosis. Bladder is not visualized. 3. Incidental note is made of questionable gallstones. Gallbladder wall is thickened at  3.6 mm. Cholecystitis cannot be excluded. Hypoproteinemic states can also present this fashion. Mild ascites also incidentally noted. Electronically Signed   By: Marcello Moores  Register   On: 07/18/2020 14:49   DG Chest Port 1 View  Result Date: 07/18/2020 CLINICAL DATA:  Status post central line placement. EXAM: PORTABLE CHEST 1 VIEW COMPARISON:  Single-view of the chest 07/17/2020. FINDINGS: Tracheostomy tube is unchanged. Right IJ catheter has been removed. The patient has a new double lumen right IJ catheter with its tip projecting in the mid to lower superior vena cava. Diffuse bilateral airspace disease and pleural effusions appear unchanged. No pneumothorax. Heart size is normal. IMPRESSION: New dialysis catheter tip projects in the mid to lower superior vena cava. No pneumothorax. No change in bilateral effusions and airspace disease. Electronically Signed   By: Inge Rise M.D.   On: 07/18/2020 14:46    Labs: BMET Recent Labs  Lab 07/16/20 0430 07/17/20 0057 07/17/20 0432 07/17/20 9024 07/17/20 1146 07/18/20 0250 07/18/20 0706 07/18/20 1512 07/18/20 2108 07/19/20 0016 07/19/20 0234 07/19/20 0650 07/19/20 1908 07/20/20 0416  NA 142 144 142 143  --  146*  --  146*  --  143 139  --  137 136  K 5.6* 5.9* 6.0* 6.1*   < > 5.0   < > 4.8 3.9 3.7 3.6 4.1 3.4* 3.7  CL 108 105 105 105  --  105  --  106  --   --  104  --  103 103  CO2 26 27 27 25   --  26  --  26  --   --  26  --  28 26  GLUCOSE 181* 138* 145* 136*  --  135*  --  173*  --   --  202*  --  157* 248*  BUN 157* 169* 170* 185*  --  190*  --  192*  --   --  125*  --  69* 53*  CREATININE 1.06 1.19 1.28* 1.23  --  1.34*  --  1.63*  --   --  0.89  --  0.56* 0.51*  CALCIUM 8.4* 8.5* 8.4* 8.3*  --  8.4*  --  8.0*  --   --  7.6*  --  7.7* 7.8*  PHOS 7.0* 8.3*  --   --   --  9.4*  --  9.9*  --   --  6.2*  --  5.0* 4.4   < > = values in this interval not displayed.   CBC Recent Labs  Lab 07/16/20 0430 07/17/20 0057 07/18/20 0250  07/19/20 0016 07/19/20 0233 07/19/20 1908  WBC 34.6* 36.0* 25.9*  --  19.4*  --   HGB 7.4* 7.8* 7.7* 7.8* 7.0* 9.4*  HCT 23.3* 23.7* 23.3* 23.0* 20.9* 27.9*  MCV 69.1* 69.3* 68.5*  --  67.9*  --   PLT 483* 565* 627*  --  492*  --     Medications:     sodium chloride   Intravenous Once   amiodarone  200 mg Per Tube Daily   B-complex with vitamin C  1 tablet Per Tube Daily   chlorhexidine gluconate (MEDLINE KIT)  15 mL Mouth Rinse BID   Chlorhexidine Gluconate Cloth  6 each Topical Q1200   docusate  100 mg Per Tube BID   feeding supplement (PROSource TF)  90 mL Per Tube TID  ferrous sulfate  220 mg Per Tube Q breakfast   guaiFENesin  5 mL Per Tube BID   insulin aspart  0-15 Units Subcutaneous Q4H   insulin glargine  8 Units Subcutaneous BID   mouth rinse  15 mL Mouth Rinse 10 times per day   midodrine  10 mg Per Tube TID WC   pantoprazole (PROTONIX) IV  40 mg Intravenous QHS   polyethylene glycol  17 g Per Tube Daily   sodium chloride flush  10-40 mL Intracatheter Q12H      Gean Quint, MD [facility] 07/20/2020, 8:23 AM

## 2020-07-20 NOTE — Progress Notes (Signed)
ANTICOAGULATION CONSULT NOTE - Follow Up Consult  Pharmacy Consult for Heparin (Xarelto on hold with AKI) Indication: atrial fibrillation  Allergies  Allergen Reactions   Adhesive [Tape] Rash    Patient Measurements: Height: '5\' 6"'$  (167.6 cm) (Simultaneous filing. User may not have seen previous data.) Weight: 92.1 kg (203 lb 0.7 oz) IBW/kg (Calculated) : 63.8 Heparin Dosing Weight: 84 kg  Vital Signs: Temp: 95 F (35 C) (07/08 0400) Temp Source: Axillary (07/08 0326) BP: 101/83 (07/08 0800) Pulse Rate: 69 (07/08 0800)  Labs: Recent Labs    07/18/20 0250 07/18/20 1512 07/18/20 2108 07/18/20 2113 07/19/20 0016 07/19/20 0233 07/19/20 0234 07/19/20 0650 07/19/20 1908 07/20/20 0416  HGB 7.7*  --   --   --  7.8* 7.0*  --   --  9.4*  --   HCT 23.3*  --   --   --  23.0* 20.9*  --   --  27.9*  --   PLT 627*  --   --   --   --  492*  --   --   --   --   APTT 37*  --  76*  --   --   --   --   --   --   --   HEPARINUNFRC <0.10*  --   --    < >  --   --   --  0.16* 0.51 0.62  CREATININE 1.34*   < >  --   --   --   --  0.89  --  0.56* 0.51*  CKTOTAL  --   --   --   --   --   --  25*  --   --   --    < > = values in this interval not displayed.   Estimated Creatinine Clearance: 91.3 mL/min (A) (by C-G formula based on SCr of 0.51 mg/dL (L)).  Medical History: Past Medical History:  Diagnosis Date   Anemia    Anxiety    Chronic respiratory failure (HCC)    DM2 (diabetes mellitus, type 2) (HCC)    Functional quadriplegia (HCC)    Hypertension    Multiple drug resistant organism (MDRO) culture positive    Urine retention    UTI (urinary tract infection)     Assessment: 70 yr old man from [facility], on Xarelto for hx Afib. The patient is now noted to be in AKI and the plan is to hold Xarelto and transition to IV heparin to avoid Xarelto accumulation. Last Xarelto dose (lower dose of 15 mg) was on 7/5 AM at 0848; transitioned to IV heparin starting on 7/6 ~24h after the last  Xarelto dose. Baseline heparin level was undetectable, baseline aPTT was 37 sec. Initial heparin level ~12 hrs after starting heparin infusion at 1100 units/hr was 0.17 units/ml, which is below the goal range for this pt. aPTT drawn at the same time was 76 sec; will monitor anticoagulation using heparin level since other factors may interfere with aPTT and baseline heparin level was undetectable.   Heparin level remains therapeutic on rate of 1600 units/hr. Hgb up to 9.4 after PRBCs on 7/7. No active bleeding noted. Remains on CRRT at this time - running well. Per Nephrology, plan to continue   Goal of Therapy:  Heparin level: 0.3-0.7 units/ml Monitor platelets by anticoagulation protocol: Yes   Plan:  - Continue Heparin at 1600 units/hr - Daily Heparin level and CBC while on therapy - Will continue to  monitor for any signs/symptoms of bleeding  Thank you for allowing pharmacy to be a part of this patient's care.  Sloan Leiter, PharmD, BCPS, BCCCP Clinical Pharmacist Please refer to [facility] for [place] numbers 07/20/2020 8:51 AM

## 2020-07-20 NOTE — Progress Notes (Addendum)
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 Most recent admission September 2021 in setting of sepsis due to MDR Klebsiella with right ureteral calculus s/p double J ureteral stent placement and treated with meropenem x 10days. He was discharged back to [facility].  6/29 patient presented to [facility] via EMS for AMS and hypotension. Per reports, patient is Aox4 at baseline but had developed AMS and had low-grade fevers with hypotension. This is on the background of undergoing pneumonia treatment for approximately one month in [facility] as well as recent worsening of hypertension causing an increase in his routine medications. Upon EMS arrival to [facility], BP 80/50 which did not improve with 1L crystalloid bolus and Levo started en route, requiring uptitration in ED with improvement to 120/82. He was also found to be hypoxemic requiring high percent FiO2 on ventilator. Thick secretions in ET tube suction and cloudy urine noted by ED team. PCCM asked to admit.   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  Anemia  DM II  HTN  Urinary Retention with chronic foley  Renal Calculi  Frequent PNA's Chronic [facility] Resident   Significant Hospital Events: Including procedures, antibiotic start and stop dates in addition to other pertinent events   6/29 admitted to ICU for septic shock on levo to maintain MAP>65,  continued on vent support  6/29 Blood Cx>> staph species in 1 of 4 cultures drawn>> suspect contaminant 6/30 Off pressors 7/2 ID Consult for MDR acinetobacter in trach asp, Meropenem changed to unasyn 7/2 PRBC transfusion, iron replacement  7/5 Pressors added again. Central line placed. Drainage from gastrostomy tube >> CT Abd without fluid collection around gastrostomy tube but with diffuse body wall edema. Echo EF 50-55%, mild LVH  7/6 Renal Replacement Therapy ; Code status changed to partial Code (DNR) 7/7 Transfuse 1U PRBC  Interim History / Subjective:   On Ventilator and CRRT, awake. Appears he is trying to make an effort to speak but unable.   Objective   Blood pressure (!) 92/58, pulse 63, temperature (!) 95 F (35 C), resp. rate (!) 35, height '5\' 6"'$  (1.676 m), weight 92.1 kg, SpO2 100 %.    Vent Mode: PRVC FiO2 (%):  [50 %] 50 % Set Rate:  [35 bmp] 35 bmp Vt Set:  [380 mL] 380 mL PEEP:  [10 cmH20] 10 cmH20 Plateau Pressure:  [23 cmH20-36 cmH20] 33 cmH20   Intake/Output Summary (Last 24 hours) at 07/20/2020 0723 Last data filed at 07/20/2020 0700 Gross per 24 hour  Intake 2990.49 ml  Output 5747 ml  Net -2756.51 ml   Filed Weights   07/18/20 0500 07/19/20 0338 07/20/20 0500  Weight: 94.1 kg 93.2 kg 92.1 kg    Examination:  Gen:      Chronically-ill appearing quadraplegic male, awake, alert, on ventilator, on CRRT, bear hugger on HEENT:  Mucous membranes are dry, sclera anicteric  Neck:     Trach in place and midline, without secretions, right IJ HD cath  in place and C/D/I Lungs:    Ventilated breaths, rhonchorous breath sounds right-side, trach midline CV:         Heart sounds difficult to appreciate over ventilation, 2+ radial pulses b/l, RRR on monitor  Abd:      Distended, normoactive bowel sounds, colostomy bag in place LLQ Ext:   2+ pitting edema b/l lower extremities up to knee, moon boots in place to b/l lower extremities GU:   Foley in place, 5cc UOP, scrotal  edema Skin:      Warm and dry; no rash Neuro: Awake, alert, able to follow simple commands such as closing eyes, raising eyebrows, smiling  Labs/imaging reviewed Na 136, K 3.7, BUN 53, Cr 0.51 Post-transfusion Hgb 9.4  Resolved Hospital Problem list   AKI Hyperkalemia Tachycardia  Assessment & Plan:  Septic Shock (POA) in setting of HCAP PNA and UTI, present on admission Hx of MDR [place], MRSA and Pseudomonas. Hx of pneumonia for which he was being treated at [place]. Procal remains elevated and increased since admission from 5 (6/29) to 11 (7/1). UA on admission consistent with UTI. Repeat UA 7/6 consistent with continued UTI. Leukocytosis is improving, has remained afebrile since admission.  CXR with b/l pulmonary infiltrates, stable without change. Still hypotensive, [id number] systolic. On Levophed and midodrine. Tachycardia resolved.  -Vancomycin d/c 7/1 as blood cultures thought to be contaminant -Meropenem changed to Unasyn on 7/2 to cover Acinetobacter. Day 7/14. -Continue Levophed and midodrine.  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 On abx for pneumonia. Anasarca slightly improving since starting CRRT. Weight is down 4 lbs since initiating CRRT.  -Continue vent, wean PEEP/FiO2 -Follow intermittent chest x-ray -Mucomyst, nebs, guaifenesin for mucociliary clearance -Chest PT -Unasyn as above, day 7/14 -Repeat CXR tomorrow, 7/9  Hypothermia Temp 95 this AM. On bair-hugger. Likely in the setting of CRRT. Patient appears to be improving clinically so less likely sign of worsened infection/shock. -Monitor temp -Continue bair-hugger  Acute metabolic encephalopathy (POA) 2/2 uremia  Improving. Baseline AxO x4. BUN with improvement since CRRT- 190>125>53 today. Palliative on board, family continues to want aggressive  treatment but code status has been changed to DNR.  -CRRT -Renal on-board, appreciate care and recommendations  Iron-Deficiency Anemia and likely Anemia of chronic disease S/p 1 unit PRBC transfusion 7/2 and 7/7. Post-transfusion Hgb 9.4. On iron supplementation.  -F/u CBC -Ferrous sulfate daily per tube  AKI secondary to ATN in the setting of sepsis Improving on CRRT. Baseline Cr 0.4, Cr improving 1.63>0.89>0.51 today with BUN 53. UOP poor with 155cc yesterday, 5.4L removed from CRRT. Net positive 1.6L since admission. Electrolytes stable.  -Renal on board, appreciate care and recommendations -Continue CRRT  -Follow urine output and creatinine  Thrombocytosis Likely reactive in setting of acute infection, iron-deficiency anemia could also be contributing.  -Follow CBC  Severe protein calorie malnutrition (POA) -Continue tube feeds  DM II CBG ranging 154-248 in last 24 hours. Requiring 28U SSI.  -Increase lantus to 12U BID -Add 2U tube feed coverage q6h -Continue mSSI  PAF Continue amiodarone PT On Heparin gtt given renal function  Periorbital Edema / Mild Exophthalmus  TSH is normal Monitor   Sacral decubitus ulcer stage IV (POA) Wound care daily  Goals of Care Patient is chronically-ill, on ventilator with poor prognosis. Code status changed to DNR.  -Appreciate Palliative care assistance with ongoing discussions regarding [place]  Best  Practice (right click and "Reselect all SmartList Selections" daily)   Diet/type: tubefeeds DVT prophylaxis: systemic heparin GI prophylaxis: PPI Lines: Dialysis Catheter and yes and it is still needed Foley:  Yes, and it is still needed Code Status:  limited  Critical care time:     Sharion Settler PGY-2 Family Medicine

## 2020-07-20 NOTE — Progress Notes (Signed)
On assessment, pt's BP dropping with increase in pressor requirements, unable to pull 100 mL fluid per nephrology order. Notified PCCM and discussed adding a second pressor or adding albumin, and albumin was ordered per Warren Lacy MD Tamala Julian. Pt responded well to albumin with an increase in BP and now able to tolerate fluid removal again as ordered, decreased pressor requirements.

## 2020-07-21 ENCOUNTER — Inpatient Hospital Stay (HOSPITAL_COMMUNITY): Payer: 59

## 2020-07-21 DIAGNOSIS — A419 Sepsis, unspecified organism: Secondary | ICD-10-CM | POA: Diagnosis not present

## 2020-07-21 DIAGNOSIS — R6521 Severe sepsis with septic shock: Secondary | ICD-10-CM | POA: Diagnosis not present

## 2020-07-21 LAB — GLUCOSE, CAPILLARY
Glucose-Capillary: 131 mg/dL — ABNORMAL HIGH (ref 70–99)
Glucose-Capillary: 164 mg/dL — ABNORMAL HIGH (ref 70–99)
Glucose-Capillary: 163 mg/dL — ABNORMAL HIGH (ref 70–99)
Glucose-Capillary: 163 mg/dL — ABNORMAL HIGH (ref 70–99)
Glucose-Capillary: 207 mg/dL — ABNORMAL HIGH (ref 70–99)
Glucose-Capillary: 245 mg/dL — ABNORMAL HIGH (ref 70–99)

## 2020-07-21 LAB — RENAL FUNCTION PANEL
Albumin: 2.6 g/dL — ABNORMAL LOW (ref 3.5–5.0)
Albumin: 2.4 g/dL — ABNORMAL LOW (ref 3.5–5.0)
Anion gap: 7 (ref 5–15)
Anion gap: 5 (ref 5–15)
BUN: 34 mg/dL — ABNORMAL HIGH (ref 8–23)
BUN: 28 mg/dL — ABNORMAL HIGH (ref 8–23)
CO2: 27 mmol/L (ref 22–32)
CO2: 27 mmol/L (ref 22–32)
Calcium: 8.2 mg/dL — ABNORMAL LOW (ref 8.9–10.3)
Calcium: 8 mg/dL — ABNORMAL LOW (ref 8.9–10.3)
Chloride: 102 mmol/L (ref 98–111)
Chloride: 103 mmol/L (ref 98–111)
Creatinine, Ser: 0.35 mg/dL — ABNORMAL LOW (ref 0.61–1.24)
Creatinine, Ser: 0.42 mg/dL — ABNORMAL LOW (ref 0.61–1.24)
GFR, Estimated: >60 mL/min (ref >60)
GFR, Estimated: >60 mL/min (ref >60)
Glucose, Bld: 138 mg/dL — ABNORMAL HIGH (ref 70–99)
Glucose, Bld: 255 mg/dL — ABNORMAL HIGH (ref 70–99)
Phosphorus: 3.5 mg/dL (ref 2.5–4.6)
Phosphorus: 2.8 mg/dL (ref 2.5–4.6)
Potassium: 4.6 mmol/L (ref 3.5–5.1)
Potassium: 5 mmol/L (ref 3.5–5.1)
Sodium: 136 mmol/L (ref 135–145)
Sodium: 135 mmol/L (ref 135–145)

## 2020-07-21 LAB — CBC
HCT: 23.2 % — ABNORMAL LOW (ref 39.0–52.0)
Hemoglobin: 7.7 g/dL — ABNORMAL LOW (ref 13.0–17.0)
MCH: 24.4 pg — ABNORMAL LOW (ref 26.0–34.0)
MCHC: 33.2 g/dL (ref 30.0–36.0)
MCV: 73.4 fL — ABNORMAL LOW (ref 80.0–100.0)
Platelets: 418 10*3/uL — ABNORMAL HIGH (ref 150–400)
RBC: 3.16 MIL/uL — ABNORMAL LOW (ref 4.22–5.81)
RDW: 25.6 % — ABNORMAL HIGH (ref 11.5–15.5)
WBC: 30.6 10*3/uL — ABNORMAL HIGH (ref 4.0–10.5)
nRBC: 6.4 % — ABNORMAL HIGH (ref 0.0–0.2)

## 2020-07-21 LAB — HEPARIN LEVEL (UNFRACTIONATED): Heparin Unfractionated: 0.35 IU/mL (ref 0.30–0.70)

## 2020-07-21 LAB — MAGNESIUM: Magnesium: 2.6 mg/dL — ABNORMAL HIGH (ref 1.7–2.4)

## 2020-07-21 MED ORDER — WARFARIN - PHARMACIST DOSING INPATIENT: Freq: Every day | Status: DC

## 2020-07-21 MED ORDER — WARFARIN SODIUM 5 MG PO TABS
5.0000 mg | ORAL_TABLET | Freq: Once | ORAL | Status: AC
  Administered 2020-07-21: 5 mg
  Filled 2020-07-21: qty 1

## 2020-07-21 MED ORDER — HYDROCORTISONE NA SUCCINATE PF 100 MG IJ SOLR
50.0000 mg | Freq: Four times a day (QID) | INTRAMUSCULAR | Status: DC
  Administered 2020-07-21 – 2020-07-23 (×9): 50 mg via INTRAVENOUS
  Filled 2020-07-21 (×9): qty 2

## 2020-07-21 NOTE — Progress Notes (Signed)
NAME:  Luis Orr, MRN:  [id number], DOB:  [birth year], LOS: 34 ADMISSION DATE:  06/29/2020, CONSULTATION DATE:  07/10/2020 REFERRING MD:  Dr Francia Greaves, EDP CHIEF COMPLAINT:  septic shock    History of Present Illness:  70 yo male from [facility] with hx of C3-C5 compressive myelopathy with functional quadriplegia and chronic trach/vent presented to ED with altered mental status and hypotension with recurrent HCAP.  Has multiple recent admissions for sepsis secondary to various infections including UTIs, pneumonias and bacteremia in setting of MDR Klebsiella, MRSA and pseudomonas. Most recent admission September 2021 in setting of sepsis due to MDR Klebsiella with right ureteral calculus s/p double J ureteral stent placement and treated with meropenem x 10days.  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 Central line placed. Drainage from gastrostomy tube >> CT Abd without fluid collection around gastrostomy tube but with diffuse body wall edema. Echo EF 50-55%, mild LVH  7/6 Renal Replacement Therapy ; Code status changed to partial Code (DNR) 7/7 Transfuse 1U PRBC 7/9 add solu cortef  Interim History / Subjective:  BP low overnight.  Given albumin.  Decreased amount of fluid being pulled with CRRT.  Low Vt and increased RR  with pressure support trial.  Objective   Blood pressure (!) 84/54, pulse 82, temperature 98.2 F (36.8 C), temperature source Oral, resp. rate (!) 35, height '5\' 6"'$  (1.676 m), weight 91.5 kg, SpO2 98 %.    Vent Mode: PRVC FiO2 (%):  [40 %-50 %] 40 % Set Rate:  [35 bmp] 35 bmp Vt Set:  [380 mL] 380 mL PEEP:  [8 cmH20-10 cmH20] 8 cmH20 Plateau Pressure:  [29 cmH20-31 cmH20] 31 cmH20   Intake/Output Summary (Last 24 hours) at 07/21/2020 0757 Last data filed at 07/21/2020 0600 Gross per 24 hour  Intake 3093.96 ml  Output 4662 ml  Net -1568.04 ml   Filed Weights   07/19/20 0338 07/20/20 0500 07/21/20 0500  Weight: 93.2 kg 92.1 kg 91.5 kg    Examination:   General - somnolent Eyes - pupils reactive ENT - trach site clean Cardiac - regular rate/rhythm, no murmur Chest - b/l crackles Abdomen - soft, non tender, colostomy and peg in place Extremities - 2+ edema Skin - sacral pressure wound Neuro - opens eyes with stimation  CXR - b/l ASD  Resolved Hospital Problem list   Hyperkalemia Tachycardia  Assessment & Plan:   Septic shock 2nd to HCAP and UTI all present on admission. - Acinetobacter in sputum culture from 07/12/20 - pressors to keep MAP > 65 - day 11 of Abx, currently on unasyn - continue midodrine  Acute on chronic hypoxic respiratory failure. Tracheostomy and ventilator dependent status. - full vent support - goal SpO2 > 92% - f/u CXR intermittently - bronchial hygiene  Hypothermia. - likely from CRRT and adrenal insufficiency - add solu cortef 7/09  Acute metabolic encephalopathy 2nd to sepsis and renal failure. - RASS goal 0 to -1 - continue wellbutrin  AKI from ATN 2nd to sepsis. - CRRT per nephrology  Anemia of critical illness, chronic disease and iron deficiency. - s/p PRBC transfusion on 7/02 and 7/07 - continue ferrous sulfate with vit C - f/u CBC - transfuse for Hb < 7  Severe protein calorie malnutrition (POA) - Continue tube  feeds  DM II poorly controlled with hyperglycemia. - SSI with lantus  Paroxysmal atrial fibrillation. - continue amiodarone - will ask pharmacy to start coumadin and transition off heparin gtt  Sacral decubitus ulcer stage IV (POA) - wound care  Goals of Care - continue medical therapy - DNR in event of cardiac arrest  Best Practice (right click and "Reselect all SmartList Selections" daily)   Diet/type: tubefeeds DVT prophylaxis: systemic heparin GI prophylaxis: PPI Lines: Dialysis Catheter and yes and it is still needed Foley:  Yes, and it is still needed Code Status:  limited  Critical care time:  33 minutes  Chesley Mires, MD [place] Pager - [phone] 07/21/2020, 8:13 AM

## 2020-07-21 NOTE — Progress Notes (Addendum)
ANTICOAGULATION CONSULT NOTE - Follow Up Consult  Pharmacy Consult for Heparin (Xarelto on hold with AKI) Indication: atrial fibrillation  Patient Measurements: Height: '5\' 6"'$  (167.6 cm) (Simultaneous filing. User may not have seen previous data.) Weight: 91.5 kg (201 lb 11.5 oz) IBW/kg (Calculated) : 63.8 Heparin Dosing Weight: 84 kg  Vital Signs: Temp: 98.2 F (36.8 C) (07/09 0715) Temp Source: Oral (07/09 0715) BP: 84/54 (07/09 0715) Pulse Rate: 82 (07/09 0715)  Labs: Recent Labs    07/18/20 2108 07/18/20 2113 07/19/20 0233 07/19/20 0234 07/19/20 0650 07/19/20 1908 07/20/20 0416 07/20/20 0802 07/20/20 1611 07/21/20 0442  HGB  --    < > 7.0*  --   --  9.4*  --  9.4*  --  7.7*  HCT  --    < > 20.9*  --   --  27.9*  --  28.9*  --  23.2*  PLT  --   --  492*  --   --   --   --  407*  --  418*  APTT 76*  --   --   --   --   --   --   --   --   --   HEPARINUNFRC  --    < >  --   --    < > 0.51 0.62  --   --  0.35  CREATININE  --   --   --  0.89  --  0.56* 0.51*  --  0.41* 0.35*  CKTOTAL  --   --   --  25*  --   --   --   --   --   --    < > = values in this interval not displayed.   Estimated Creatinine Clearance: 91 mL/min (A) (by C-G formula based on SCr of 0.35 mg/dL (L)).   Assessment: 70 years of age male from [place], on Xarelto for hx Afib. Patient with AKI on CRRT and Xarelto on hold. Last Xarelto dose (lower dose of 15 mg) was on 7/5 AM at [place]. Patient was transitioned to IV heparin and remains on therapy.     Heparin level remains therapeutic on current rate of 1600 units/hr. Hgb low at 7.7, Platelets elevated but trending down at 418, and no active bleeding noted. Remains on CRRT at this time - running well.  Goal of Therapy:  Heparin level: 0.3-0.7 units/ml Monitor platelets by anticoagulation protocol: Yes   Plan:  - Continue Heparin at 1600 units/hr - Daily Heparin level and CBC while on therapy - Will continue to monitor for any signs/symptoms of  bleeding  Thank you for allowing pharmacy to be a part of this patient's care.  Sloan Leiter, PharmD, BCPS, BCCCP Clinical Pharmacist Please refer to [facility] for [place] numbers 07/21/2020 7:57 AM    Addnedum: Consulted to bridge to Warfarin therapy.  Note baseline INR on admission was 1.4 - elevated in setting of recent Xarelto. Now 11 days out from last dose.  Patient is on both Amiodarone and Unasyn and low albumin noted.  Plan:  Continue IV Heparin at current rate.  Add Warfarin therapy of '5mg'$  per tube x1 today.  Daily PT/INR while on therapy. Continue to monitor Daily Heparin level and CBC.   07/21/2020, 8:30 AM

## 2020-07-21 NOTE — Progress Notes (Signed)
[facility] Progress Note    Assessment/ Plan:   AKI -baseline Cr 0.4. AKI likely secondary to ATN in the context of shock. CRRT initiated on 7/6 for anasarca with ineffective diuresis and uremia. Appreciate assistance from CCM for rij temp line placement 7/6 -Not on an ideal long term candidate for HD due to poor functional status, vent dependency. I voiced my concerns to both son and wife over the phone on 7/6 and they had wished to still proceed forward with renal replacement therapy. Appreciate palliative care's assistance -Continue with CRRT, will aim more so to be net neg 100cc/hr. All bags 4k/2.5cal -Avoid nephrotoxic medications including NSAIDs and iodinated intravenous contrast exposure unless the latter is absolutely indicated.  Preferred narcotic agents for pain control are hydromorphone, fentanyl, and methadone. Morphine should not be used. Avoid Baclofen and avoid oral sodium phosphate and magnesium citrate based laxatives / bowel preps. Continue strict Input and Output monitoring. Will monitor the patient closely with you and intervene or adjust therapy as indicated by changes in clinical status/labs   Anasarca -CRRT as above, aiming for net neg 100cc/hr provided there are no inc'ed pressor requirements, persistent anasarca present at the moment. Can back off to net neg 50cc/hr if BP is unable to keep up. Anasarca still persistent, may need to increase to net neg 150-200cc/hr however, at this junction, I do not believe his BP will tolerate that  Hyperkalemia -resolved. managing with renal replacement therapy, improved/resolved. Now on all 4k bags  CKD-MBD, hyperphosphatemia -managing with crrt, continue to monitor phos  Acute metabolic encephalopathy, likely related to sepsis and uremia -crrt as above, apparently alert and oriented at baseline.  Septic shock likely 2/2 HCAP -pressor support and abx per ccm  Chronic respiratory failure s/p trach -vent mgmt  per ccm  Quadriplegia 2/2 compressive myelopathy -resides at [facility] -appreciate palliative care's assistance  Microcytic anemia -transfuse for hgb <7. Avoid iron for now in light of active infection -hgb from 9.4 to 7.7. recommend ruling out bleed, per primary service  Subjective:   Seen and examined on CRRT. Issues with worsening hypotension last night and pulling fluid on crrt. Albumin given which improved BP, able to keep net neg 100cc/hr now per staff. No other acute events. Uop 5cc charted, on 50% fio2., hgb down to 7.7 from 9.4.   Objective:   BP (!) 92/53   Pulse 85   Temp 98.2 F (36.8 C) (Oral)   Resp (!) 35   Ht _0  (1.676 m) Comment: Simultaneous filing. User may not have seen previous data.  Wt 91.5 kg   SpO2 93%   BMI 32.56 kg/m   Intake/Output Summary (Last 24 hours) at 07/21/2020 0820 Last data filed at 07/21/2020 0801 Gross per 24 hour  Intake 3319.6 ml  Output 4682 ml  Net -1362.4 ml   Weight change: -0.6 kg  Physical Exam: MPN:[id number] ill appearing CVS:s1s2, rrr Resp:trach, bl chest expansion QQP:YPPJ KDT:OIZTIWPY Neuro: quadriplegic, opening eyes but not responding to vocal stimuli nor following commands  Imaging: DG Chest Port 1 View  Result Date: 07/21/2020 CLINICAL DATA:  Respiratory failure and pneumonia. EXAM: PORTABLE CHEST 1 VIEW COMPARISON:  Chest x-rays dated 07/18/2020 and 07/17/2020. FINDINGS: Heart size and mediastinal contours are stable. Tubes and lines are stable in position. Patchy bilateral airspace opacities are not significantly changed. No pneumothorax is seen. IMPRESSION: Stable chest x-ray. Patchy bilateral airspace opacities are not significantly changed, compatible with multifocal pneumonia. Electronically Signed   By: Cherlynn Kaiser  Enriqueta Shutter M.D.   On: 07/21/2020 08:12    Labs: BMET Recent Labs  Lab 07/18/20 0250 07/18/20 0706 07/18/20 1512 07/18/20 2108 07/19/20 0016 07/19/20 0234 07/19/20 0650 07/19/20 1908 07/20/20 0416  07/20/20 1611 07/21/20 0442  NA 146*  --  146*  --  143 139  --  137 136 134* 136  K 5.0   < > 4.8   < > 3.7 3.6 4.1 3.4* 3.7 4.5 4.6  CL 105  --  106  --   --  104  --  103 103 105 102  CO2 26  --  26  --   --  26  --  28 26 19* 27  GLUCOSE 135*  --  173*  --   --  202*  --  157* 248* 163* 138*  BUN 190*  --  192*  --   --  125*  --  69* 53* 44* 34*  CREATININE 1.34*  --  1.63*  --   --  0.89  --  0.56* 0.51* 0.41* 0.35*  CALCIUM 8.4*  --  8.0*  --   --  7.6*  --  7.7* 7.8* 7.7* 8.2*  PHOS 9.4*  --  9.9*  --   --  6.2*  --  5.0* 4.4 4.0 3.5   < > = values in this interval not displayed.   CBC Recent Labs  Lab 07/18/20 0250 07/19/20 0016 07/19/20 0233 07/19/20 1908 07/20/20 0802 07/21/20 0442  WBC 25.9*  --  19.4*  --  29.6* 30.6*  HGB 7.7*   < > 7.0* 9.4* 9.4* 7.7*  HCT 23.3*   < > 20.9* 27.9* 28.9* 23.2*  MCV 68.5*  --  67.9*  --  72.6* 73.4*  PLT 627*  --  492*  --  407* 418*   < > = values in this interval not displayed.    Medications:     amiodarone  200 mg Per Tube Daily   B-complex with vitamin C  1 tablet Per Tube Daily   buPROPion  75 mg Per Tube BID   chlorhexidine gluconate (MEDLINE KIT)  15 mL Mouth Rinse BID   Chlorhexidine Gluconate Cloth  6 each Topical Q1200   docusate  100 mg Per Tube BID   feeding supplement (PROSource TF)  90 mL Per Tube TID   ferrous sulfate  220 mg Per Tube Q breakfast   guaiFENesin  5 mL Per Tube BID   hydrocortisone sod succinate (SOLU-CORTEF) inj  50 mg Intravenous Q6H   insulin aspart  0-15 Units Subcutaneous Q4H   insulin glargine  12 Units Subcutaneous BID   mouth rinse  15 mL Mouth Rinse 10 times per day   midodrine  10 mg Per Tube Q8H   pantoprazole sodium  40 mg Per Tube QHS   polyethylene glycol  17 g Per Tube Daily   sodium chloride flush  10-40 mL Intracatheter Q12H      Gean Quint, MD [facility] 07/21/2020, 8:20 AM

## 2020-07-22 DIAGNOSIS — J9601 Acute respiratory failure with hypoxia: Secondary | ICD-10-CM | POA: Diagnosis not present

## 2020-07-22 LAB — RENAL FUNCTION PANEL
Albumin: 2.2 g/dL — ABNORMAL LOW (ref 3.5–5.0)
Albumin: 2 g/dL — ABNORMAL LOW (ref 3.5–5.0)
Anion gap: 6 (ref 5–15)
Anion gap: 9 (ref 5–15)
BUN: 32 mg/dL — ABNORMAL HIGH (ref 8–23)
BUN: 29 mg/dL — ABNORMAL HIGH (ref 8–23)
CO2: 28 mmol/L (ref 22–32)
CO2: 23 mmol/L (ref 22–32)
Calcium: 8.2 mg/dL — ABNORMAL LOW (ref 8.9–10.3)
Calcium: 8 mg/dL — ABNORMAL LOW (ref 8.9–10.3)
Chloride: 101 mmol/L (ref 98–111)
Chloride: 103 mmol/L (ref 98–111)
Creatinine, Ser: 0.34 mg/dL — ABNORMAL LOW (ref 0.61–1.24)
Creatinine, Ser: 0.35 mg/dL — ABNORMAL LOW (ref 0.61–1.24)
GFR, Estimated: >60 mL/min (ref >60)
GFR, Estimated: >60 mL/min (ref >60)
Glucose, Bld: 296 mg/dL — ABNORMAL HIGH (ref 70–99)
Glucose, Bld: 243 mg/dL — ABNORMAL HIGH (ref 70–99)
Phosphorus: 2.5 mg/dL (ref 2.5–4.6)
Phosphorus: 2.7 mg/dL (ref 2.5–4.6)
Potassium: 4.7 mmol/L (ref 3.5–5.1)
Potassium: 5.1 mmol/L (ref 3.5–5.1)
Sodium: 135 mmol/L (ref 135–145)
Sodium: 135 mmol/L (ref 135–145)

## 2020-07-22 LAB — MAGNESIUM: Magnesium: 2.4 mg/dL (ref 1.7–2.4)

## 2020-07-22 LAB — HEPARIN LEVEL (UNFRACTIONATED): Heparin Unfractionated: 0.48 IU/mL (ref 0.30–0.70)

## 2020-07-22 LAB — PROTIME-INR
INR: 1.6 — ABNORMAL HIGH (ref 0.8–1.2)
Prothrombin Time: 18.7 seconds — ABNORMAL HIGH (ref 11.4–15.2)

## 2020-07-22 LAB — CBC
HCT: 24 % — ABNORMAL LOW (ref 39.0–52.0)
Hemoglobin: 7.9 g/dL — ABNORMAL LOW (ref 13.0–17.0)
MCH: 23.8 pg — ABNORMAL LOW (ref 26.0–34.0)
MCHC: 32.9 g/dL (ref 30.0–36.0)
MCV: 72.3 fL — ABNORMAL LOW (ref 80.0–100.0)
Platelets: 431 10*3/uL — ABNORMAL HIGH (ref 150–400)
RBC: 3.32 MIL/uL — ABNORMAL LOW (ref 4.22–5.81)
RDW: 28.1 % — ABNORMAL HIGH (ref 11.5–15.5)
WBC: 34.3 10*3/uL — ABNORMAL HIGH (ref 4.0–10.5)
nRBC: 1.9 % — ABNORMAL HIGH (ref 0.0–0.2)

## 2020-07-22 LAB — GLUCOSE, CAPILLARY
Glucose-Capillary: 261 mg/dL — ABNORMAL HIGH (ref 70–99)
Glucose-Capillary: 157 mg/dL — ABNORMAL HIGH (ref 70–99)
Glucose-Capillary: 210 mg/dL — ABNORMAL HIGH (ref 70–99)
Glucose-Capillary: 238 mg/dL — ABNORMAL HIGH (ref 70–99)
Glucose-Capillary: 207 mg/dL — ABNORMAL HIGH (ref 70–99)
Glucose-Capillary: 177 mg/dL — ABNORMAL HIGH (ref 70–99)

## 2020-07-22 MED ORDER — WARFARIN SODIUM 2.5 MG PO TABS
2.5000 mg | ORAL_TABLET | Freq: Once | ORAL | Status: AC
  Administered 2020-07-22: 2.5 mg
  Filled 2020-07-22: qty 1

## 2020-07-22 NOTE — Progress Notes (Signed)
Luis Orr Progress Note    Assessment/ Plan:   AKI -baseline Cr 0.4. AKI likely secondary to ATN in the context of shock. CRRT initiated on 7/6 for anasarca with ineffective diuresis and uremia. Appreciate assistance from CCM for rij temp line placement 7/6 -Not on an ideal long term candidate for HD due to poor functional status, vent dependency. I voiced my concerns to both son and wife over the phone on 7/6 and they had wished to still proceed forward with renal replacement therapy. Appreciate palliative care's assistance -Continue with CRRT, will aim more so to be net neg 100cc/hr. All bags 4k/2.5cal. No changes today, would keep him on CRRT for today to help offload more volume -Avoid nephrotoxic medications including NSAIDs and iodinated intravenous contrast exposure unless the latter is absolutely indicated.  Preferred narcotic agents for pain control are hydromorphone, fentanyl, and methadone. Morphine should not be used. Avoid Baclofen and avoid oral sodium phosphate and magnesium citrate based laxatives / bowel preps. Continue strict Input and Output monitoring. Will monitor the patient closely with you and intervene or adjust therapy as indicated by changes in clinical status/labs   Anasarca -CRRT as above, aiming for net neg 100cc/hr provided there are no inc'ed pressor requirements, persistent anasarca present at the moment. Can back off to net neg 50cc/hr if BP is unable to keep up. Anasarca still persistent, may need to increase to net neg 150-200cc/hr however, at this junction, I do not believe his BP will tolerate that  Hyperkalemia -resolved. managing with renal replacement therapy, improved/resolved. Now on all 4k bags  CKD-MBD, hyperphosphatemia -managing with crrt, continue to monitor phos  Acute metabolic encephalopathy, likely related to sepsis and uremia -crrt as above, apparently alert and oriented at baseline.  Septic shock likely 2/2  HCAP+UTI -pressor support and abx per ccm, off pressors now. 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 Avoid iron for now in light of active infection -hgb 7.9. recommend ruling out bleed, per primary service  Subjective:   Seen and examined on CRRT. Now off pressors. Started on solucortef yesterday. Keeping net neg 100cc/hr. Fio2 40%. Uop 5cc charted.   Objective:   BP (!) 85/53   Pulse (!) 57   Temp (!) 92.1 F (33.4 C) (Axillary) Comment: bear hugger turned on  Resp (!) 35   Ht _0  (1.676 m) Comment: Simultaneous filing. User may not have seen previous data.  Wt 90.8 kg   SpO2 100%   BMI 32.31 kg/m   Intake/Output Summary (Last 24 hours) at 07/22/2020 0859 Last data filed at 07/22/2020 0800 Gross per 24 hour  Intake 2578.56 ml  Output 4825 ml  Net -2246.44 ml   Weight change: -0.7 kg  Physical Exam: ENI:[id number] ill appearing CVS:s1s2, rrr Resp:trach, bl chest expansion WER:XVQM GQQ:PYPPJKDT (slightly improved) Neuro: quadriplegic, opening eyes but not responding to vocal stimuli nor following commands  Imaging: DG Chest Port 1 View  Result Date: 07/21/2020 CLINICAL DATA:  Respiratory failure and pneumonia. EXAM: PORTABLE CHEST 1 VIEW COMPARISON:  Chest x-rays dated 07/18/2020 and 07/17/2020. FINDINGS: Heart size and mediastinal contours are stable. Tubes and lines are stable in position. Patchy bilateral airspace opacities are not significantly changed. No pneumothorax is seen. IMPRESSION: Stable chest x-ray. Patchy bilateral airspace opacities are not significantly changed, compatible with multifocal pneumonia. Electronically Signed   By: Cherlynn Kaiser  Enriqueta Shutter M.D.   On: 07/21/2020 08:12    Labs: BMET Recent Labs  Lab 07/19/20 0234 07/19/20 0650 07/19/20 1908 07/20/20 0416  07/20/20 1611 07/21/20 0442 07/21/20 1951 07/22/20 0302  NA 139  --  137 136 134* 136 135 135  K 3.6 4.1 3.4* 3.7 4.5 4.6 5.0 4.7  CL 104  --  103 103 105 102 103 101  CO2 26  --  28 26 19* _0 GLUCOSE 202*  --  157* 248* 163* 138* 255* 296*  BUN 125*  --  69* 53* 44* 34* 28* 32*  CREATININE 0.89  --  0.56* 0.51* 0.41* 0.35* 0.42* 0.34*  CALCIUM 7.6*  --  7.7* 7.8* 7.7* 8.2* 8.0* 8.2*  PHOS 6.2*  --  5.0* 4.4 4.0 3.5 2.8 2.5   CBC Recent Labs  Lab 07/19/20 0233 07/19/20 1908 07/20/20 0802 07/21/20 0442 07/22/20 0302  WBC 19.4*  --  29.6* 30.6* 34.3*  HGB 7.0* 9.4* 9.4* 7.7* 7.9*  HCT 20.9* 27.9* 28.9* 23.2* 24.0*  MCV 67.9*  --  72.6* 73.4* 72.3*  PLT 492*  --  407* 418* 431*    Medications:     amiodarone  200 mg Per Tube Daily   B-complex with vitamin C  1 tablet Per Tube Daily   buPROPion  75 mg Per Tube BID   chlorhexidine gluconate (MEDLINE KIT)  15 mL Mouth Rinse BID   Chlorhexidine Gluconate Cloth  6 each Topical Q1200   docusate  100 mg Per Tube BID   feeding supplement (PROSource TF)  90 mL Per Tube TID   ferrous sulfate  220 mg Per Tube Q breakfast   guaiFENesin  5 mL Per Tube BID   hydrocortisone sod succinate (SOLU-CORTEF) inj  50 mg Intravenous Q6H   insulin aspart  0-15 Units Subcutaneous Q4H   insulin glargine  12 Units Subcutaneous BID   mouth rinse  15 mL Mouth Rinse 10 times per day   midodrine  10 mg Per Tube Q8H   pantoprazole sodium  40 mg Per Tube QHS   polyethylene glycol  17 g Per Tube Daily   sodium chloride flush  10-40 mL Intracatheter Q12H   warfarin  2.5 mg Per Tube ONCE-1600   Warfarin - Pharmacist Dosing Inpatient   Does not apply [place], MD [facility] 07/22/2020, 8:59 AM

## 2020-07-22 NOTE — Progress Notes (Signed)
RT NOTE: patient tolerating current ventilator settings well at this time.  No issues noted.  Will continue to monitor.

## 2020-07-22 NOTE — Progress Notes (Signed)
ANTICOAGULATION CONSULT NOTE - Follow Up Consult  Pharmacy Consult for Heparin + Warfarin (Xarelto on hold with AKI) Indication: atrial fibrillation  Patient Measurements: Height: '5\' 6"'$  (167.6 cm) (Simultaneous filing. User may not have seen previous data.) Weight: 90.8 kg (200 lb 2.8 oz) IBW/kg (Calculated) : 63.8 Heparin Dosing Weight: 84 kg  Vital Signs: Temp: 97.4 F (36.3 C) (07/10 0300) Temp Source: Axillary (07/10 0300) BP: 81/45 (07/10 0700) Pulse Rate: 60 (07/10 0630)  Labs: Recent Labs    07/20/20 0416 07/20/20 0802 07/20/20 1611 07/21/20 0442 07/21/20 1951 07/22/20 0302 07/22/20 0303  HGB  --  9.4*  --  7.7*  --  7.9*  --   HCT  --  28.9*  --  23.2*  --  24.0*  --   PLT  --  407*  --  418*  --  431*  --   LABPROT  --   --   --   --   --  18.7*  --   INR  --   --   --   --   --  1.6*  --   HEPARINUNFRC 0.62  --   --  0.35  --   --  0.48  CREATININE 0.51*  --    < > 0.35* 0.42* 0.34*  --    < > = values in this interval not displayed.   Estimated Creatinine Clearance: 90.7 mL/min (A) (by C-G formula based on SCr of 0.34 mg/dL (L)).   Assessment: 70 years of age male from [place], on Xarelto for hx Afib. Patient with AKI on CRRT and Xarelto on hold. Last Xarelto dose (lower dose of 15 mg) was on 7/5 AM at [place]. Patient was transitioned to IV heparin and remains on therapy.     Heparin level remains therapeutic on current rate of 1600 units/hr.  INR up to 1.6 after one dose of Warfarin '5mg'$  x1. Patient on Amio and Unasyn as well as low albumin contributing to response.  Hgb low at 7.9, Platelets elevated but trending down at 431, and no active bleeding noted. Remains on CRRT at this time - running well.  Goal of Therapy:  Heparin level: 0.3-0.7 units/ml Monitor platelets by anticoagulation protocol: Yes   Plan:  - Warfarin 2.'5mg'$  po x1 this PM (lower dose to prevent over shooting goal of 2-3) - Daily PT/INR - Continue Heparin at 1600 units/hr - Daily  Heparin level and CBC while on therapy - Will continue to monitor for any signs/symptoms of bleeding  Thank you for allowing pharmacy to be a part of this patient's care.  Sloan Leiter, PharmD, BCPS, BCCCP Clinical Pharmacist Please refer to [facility] for [place] numbers 07/22/2020 7:13 AM

## 2020-07-22 NOTE — Progress Notes (Signed)
NAME:  Luis Orr, MRN:  [id number], DOB:  1950-08-17, LOS: 92 ADMISSION DATE:  07/05/2020, CONSULTATION DATE:  06/20/2020 REFERRING MD:  Dr Francia Greaves, EDP CHIEF COMPLAINT:  septic shock    History of Present Illness:  70 yo male from [facility] with hx of C3-C5 compressive myelopathy with functional quadriplegia and chronic trach/vent presented to ED with altered mental status and hypotension with recurrent HCAP.  Has multiple recent admissions for sepsis secondary to various infections including UTIs, pneumonias and bacteremia in setting of MDR Klebsiella, MRSA and pseudomonas. Most recent admission September 2021 in setting of sepsis due to MDR Klebsiella with right ureteral calculus s/p double J ureteral stent placement and treated with meropenem x 10days.  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 Central line placed. Drainage from gastrostomy tube >> CT Abd without fluid collection around gastrostomy tube but with diffuse body wall edema. Echo EF 50-55%, mild LVH  7/6 Renal Replacement Therapy ; Code status changed to partial Code (DNR) 7/7 Transfuse 1U PRBC 7/9 add solu cortef 7/10 off pressor  Interim History / Subjective:  Off pressors this AM.  Pulling more fluid with CRRT.  Objective   Blood pressure (!) 85/53,  pulse (!) 57, temperature (!) 92.1 F (33.4 C), temperature source Axillary, resp. rate (!) 35, height '5\' 6"'$  (1.676 m), weight 90.8 kg, SpO2 100 %.    Vent Mode: PRVC FiO2 (%):  [40 %] 40 % Set Rate:  [35 bmp] 35 bmp Vt Set:  [380 mL] 380 mL PEEP:  [8 cmH20] 8 cmH20 Plateau Pressure:  [16 cmH20-28 cmH20] 18 cmH20   Intake/Output Summary (Last 24 hours) at 07/22/2020 [id number] Last data filed at 07/22/2020 0800 Gross per 24 hour  Intake 2490.26 ml  Output 4674 ml  Net -2183.74 ml   Filed Weights   07/20/20 0500 07/21/20 0500 07/22/20 0500  Weight: 92.1 kg 91.5 kg 90.8 kg    Examination:    General - alert Eyes - pupils reactive ENT - trach site clean Cardiac - regular rate/rhythm, no murmur Chest - scattered rhonchi Abdomen - soft, colostomy and peg in place Extremities - 1+ edema Skin - sacral pressure wound Neuro - opens eyes with stimulation GU - penile edema  Resolved Hospital Problem list   Hyperkalemia Tachycardia  Assessment & Plan:   Septic shock 2nd to HCAP and UTI all present on admission. - Acinetobacter in sputum culture from 07/12/20 - off pressors 7/10 - day 12 of ABx, currently on unasyn - continue midodrine  Acute on chronic hypoxic respiratory failure. Tracheostomy and ventilator dependent status. - full vent support - goal SpO2 > 92% - f/u CXR intermittently - bronchial hygiene  Hypothermia. - likely from CRRT and adrenal insufficiency - continue solu cortef 50 mg q6h for now  Acute metabolic encephalopathy 2nd to sepsis and renal failure. - RASS goal  0 to -1 - continue wellbutrin  AKI from ATN 2nd to sepsis. - CRRT per nephrology; hopefully can transition to iHD in near future  Anemia of critical illness, chronic disease and iron deficiency. - s/p PRBC transfusion on 7/02 and 7/07 - continue ferrous sulfate with vit C - f/u CBC - transfuse for Hb < 7  Severe protein calorie malnutrition (POA) - continue tube feeds  DM II poorly  controlled with hyperglycemia. - SSI with lantus  Paroxysmal atrial fibrillation. - continue amiodarone - transition heparin gtt to coumadin per pharmacy  Sacral decubitus ulcer stage IV (POA) - wound care  Goals of Care - continue medical therapy - DNR in event of cardiac arrest  Best Practice (right click and "Reselect all SmartList Selections" daily)   Diet/type: tubefeeds DVT prophylaxis: systemic heparin GI prophylaxis: PPI Lines: Dialysis Catheter and yes and it is still needed Foley:  Yes, and it is still needed Code Status:  limited Disposition: likely back to [facility] once he is off CRRT  Labs:   CMP Latest Ref Rng & Units 07/22/2020 07/21/2020 07/21/2020  Glucose 70 - 99 mg/dL 296(H) 255(H) 138(H)  BUN 8 - 23 mg/dL 32(H) 28(H) 34(H)  Creatinine 0.61 - 1.24 mg/dL 0.34(L) 0.42(L) 0.35(L)  Sodium 135 - 145 mmol/L 135 135 136  Potassium 3.5 - 5.1 mmol/L 4.7 5.0 4.6  Chloride 98 - 111 mmol/L 101 103 102  CO2 22 - 32 mmol/L '28 27 27  '$ Calcium 8.9 - 10.3 mg/dL 8.2(L) 8.0(L) 8.2(L)  Total Protein 6.5 - 8.1 g/dL - - -  Total Bilirubin 0.3 - 1.2 mg/dL - - -  Alkaline Phos 38 - 126 U/L - - -  AST 15 - 41 U/L - - -  ALT 0 - 44 U/L - - -    CBC Latest Ref Rng & Units 07/22/2020 07/21/2020 07/20/2020  WBC 4.0 - 10.5 K/uL 34.3(H) 30.6(H) 29.6(H)  Hemoglobin 13.0 - 17.0 g/dL 7.9(L) 7.7(L) 9.4(L)  Hematocrit 39.0 - 52.0 % 24.0(L) 23.2(L) 28.9(L)  Platelets 150 - 400 K/uL 431(H) 418(H) 407(H)    ABG    Component Value Date/Time   PHART 7.295 (L) 07/19/2020 0016   PCO2ART 58.9 (H) 07/19/2020 0016   PO2ART 81 (L) 07/19/2020 0016   HCO3 28.9 (H) 07/19/2020 0016   TCO2 31 07/19/2020 0016   O2SAT 95.0 07/19/2020 0016    CBG (last 3)  Recent Labs    07/21/20 2309 07/22/20 0307 07/22/20 0716  GLUCAP 245* 261* 157*    Signature:  Chesley Mires, MD [place] Pager - [phone] 07/22/2020, 9:37 AM

## 2020-07-23 DIAGNOSIS — N179 Acute kidney failure, unspecified: Secondary | ICD-10-CM

## 2020-07-23 DIAGNOSIS — J9601 Acute respiratory failure with hypoxia: Secondary | ICD-10-CM | POA: Diagnosis not present

## 2020-07-23 LAB — RENAL FUNCTION PANEL
Albumin: 2.2 g/dL — ABNORMAL LOW (ref 3.5–5.0)
Albumin: 2.2 g/dL — ABNORMAL LOW (ref 3.5–5.0)
Anion gap: 8 (ref 5–15)
Anion gap: 4 — ABNORMAL LOW (ref 5–15)
BUN: 27 mg/dL — ABNORMAL HIGH (ref 8–23)
BUN: 26 mg/dL — ABNORMAL HIGH (ref 8–23)
CO2: 25 mmol/L (ref 22–32)
CO2: 29 mmol/L (ref 22–32)
Calcium: 8.3 mg/dL — ABNORMAL LOW (ref 8.9–10.3)
Calcium: 8.2 mg/dL — ABNORMAL LOW (ref 8.9–10.3)
Chloride: 103 mmol/L (ref 98–111)
Chloride: 103 mmol/L (ref 98–111)
Creatinine, Ser: 0.33 mg/dL — ABNORMAL LOW (ref 0.61–1.24)
Creatinine, Ser: 0.38 mg/dL — ABNORMAL LOW (ref 0.61–1.24)
GFR, Estimated: >60 mL/min (ref >60)
GFR, Estimated: >60 mL/min (ref >60)
Glucose, Bld: 206 mg/dL — ABNORMAL HIGH (ref 70–99)
Glucose, Bld: 214 mg/dL — ABNORMAL HIGH (ref 70–99)
Phosphorus: 2.5 mg/dL (ref 2.5–4.6)
Phosphorus: 2.6 mg/dL (ref 2.5–4.6)
Potassium: 5.2 mmol/L — ABNORMAL HIGH (ref 3.5–5.1)
Potassium: 4.2 mmol/L (ref 3.5–5.1)
Sodium: 136 mmol/L (ref 135–145)
Sodium: 136 mmol/L (ref 135–145)

## 2020-07-23 LAB — CBC
HCT: 26.9 % — ABNORMAL LOW (ref 39.0–52.0)
Hemoglobin: 8.5 g/dL — ABNORMAL LOW (ref 13.0–17.0)
MCH: 23.6 pg — ABNORMAL LOW (ref 26.0–34.0)
MCHC: 31.6 g/dL (ref 30.0–36.0)
MCV: 74.7 fL — ABNORMAL LOW (ref 80.0–100.0)
Platelets: 370 10*3/uL (ref 150–400)
RBC: 3.6 MIL/uL — ABNORMAL LOW (ref 4.22–5.81)
RDW: 29.5 % — ABNORMAL HIGH (ref 11.5–15.5)
WBC: 36.3 10*3/uL — ABNORMAL HIGH (ref 4.0–10.5)
nRBC: 2 % — ABNORMAL HIGH (ref 0.0–0.2)

## 2020-07-23 LAB — GLUCOSE, CAPILLARY
Glucose-Capillary: 210 mg/dL — ABNORMAL HIGH (ref 70–99)
Glucose-Capillary: 188 mg/dL — ABNORMAL HIGH (ref 70–99)
Glucose-Capillary: 173 mg/dL — ABNORMAL HIGH (ref 70–99)
Glucose-Capillary: 256 mg/dL — ABNORMAL HIGH (ref 70–99)
Glucose-Capillary: 213 mg/dL — ABNORMAL HIGH (ref 70–99)
Glucose-Capillary: 237 mg/dL — ABNORMAL HIGH (ref 70–99)

## 2020-07-23 LAB — HEPARIN LEVEL (UNFRACTIONATED): Heparin Unfractionated: 0.41 IU/mL (ref 0.30–0.70)

## 2020-07-23 LAB — PROTIME-INR
INR: 1.5 — ABNORMAL HIGH (ref 0.8–1.2)
Prothrombin Time: 17.9 seconds — ABNORMAL HIGH (ref 11.4–15.2)

## 2020-07-23 LAB — MAGNESIUM: Magnesium: 2.4 mg/dL (ref 1.7–2.4)

## 2020-07-23 MED ORDER — WARFARIN SODIUM 3 MG PO TABS: 3.0000 mg | ORAL_TABLET | Freq: Once | ORAL | Status: DC

## 2020-07-23 MED ORDER — INSULIN GLARGINE 100 UNIT/ML ~~LOC~~ SOLN
15.0000 [IU] | Freq: Two times a day (BID) | SUBCUTANEOUS | Status: DC
  Administered 2020-07-23 – 2020-07-24 (×2): 15 [IU] via SUBCUTANEOUS
  Filled 2020-07-23 (×3): qty 0.15

## 2020-07-23 MED ORDER — POLYETHYLENE GLYCOL 3350 17 G PO PACK: 17.0000 g | PACK | Freq: Every day | ORAL | Status: DC | PRN

## 2020-07-23 MED ORDER — PRISMASOL BGK 0/2.5 32-2.5 MEQ/L EC SOLN
Status: DC
  Filled 2020-07-23 (×9): qty 5000

## 2020-07-23 MED ORDER — WARFARIN SODIUM 3 MG PO TABS
3.0000 mg | ORAL_TABLET | Freq: Once | ORAL | Status: AC
  Administered 2020-07-23: 3 mg
  Filled 2020-07-23: qty 1

## 2020-07-23 MED ORDER — MIDODRINE HCL 5 MG PO TABS
15.0000 mg | ORAL_TABLET | Freq: Three times a day (TID) | ORAL | Status: DC
  Administered 2020-07-23 – 2020-07-30 (×20): 15 mg
  Filled 2020-07-23 (×20): qty 3

## 2020-07-23 MED ORDER — HYDROCORTISONE NA SUCCINATE PF 100 MG IJ SOLR
50.0000 mg | Freq: Two times a day (BID) | INTRAMUSCULAR | Status: DC
  Administered 2020-07-23 – 2020-07-26 (×6): 50 mg via INTRAVENOUS
  Filled 2020-07-23 (×6): qty 2

## 2020-07-23 MED ORDER — NOREPINEPHRINE 16 MG/250ML-% IV SOLN
0.0000 ug/min | INTRAVENOUS | Status: DC
  Administered 2020-07-23: 2 ug/min via INTRAVENOUS
  Administered 2020-07-26: 6 ug/min via INTRAVENOUS
  Filled 2020-07-23 (×2): qty 250

## 2020-07-23 NOTE — Progress Notes (Signed)
NAME:  Luis Orr, MRN:  [id number], DOB:  1950-08-25, LOS: 79 ADMISSION DATE:  07/06/2020, CONSULTATION DATE:  07/09/2020 REFERRING MD:  Dr Francia Greaves, EDP CHIEF COMPLAINT:  septic shock    History of Present Illness:  70 yo male from [facility] with hx of C3-C5 compressive myelopathy with functional quadriplegia and chronic trach/vent presented to ED with altered mental status and hypotension with recurrent HCAP.  Has multiple recent admissions for sepsis secondary to various infections including UTIs, pneumonias and bacteremia in setting of MDR Klebsiella, MRSA and pseudomonas. Most recent admission September 2021 in setting of sepsis due to MDR Klebsiella with right ureteral calculus s/p double J ureteral stent placement and treated with meropenem x 10days.  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 Central line placed. Drainage from gastrostomy tube >> CT Abd without fluid collection around gastrostomy tube but with diffuse body wall edema. Echo EF 50-55%, mild LVH  7/6 Renal Replacement Therapy ; Code status changed to partial Code (DNR) 7/7 Transfuse 1U PRBC 7/9 add solu cortef 7/10 off pressors  Interim History / Subjective:  Awake, ventilated.   Objective   Blood pressure 119/65, pulse 89, temperature 97.7 F  (36.5 C), temperature source Axillary, resp. rate (!) 35, height '5\' 6"'$  (1.676 m), weight 83.9 kg, SpO2 95 %.    Vent Mode: PRVC FiO2 (%):  [30 %-40 %] 30 % Set Rate:  [35 bmp] 35 bmp Vt Set:  [380 mL] 380 mL PEEP:  [8 cmH20-10 cmH20] 10 cmH20 Plateau Pressure:  [17 cmH20-32 cmH20] 32 cmH20   Intake/Output Summary (Last 24 hours) at 07/23/2020 0824 Last data filed at 07/23/2020 0700 Gross per 24 hour  Intake 2398.54 ml  Output 4670 ml  Net -2271.46 ml   Filed Weights   07/21/20 0500 07/22/20 0500 07/23/20 0500  Weight: 91.5 kg 90.8 kg 83.9 kg    Examination:  General - awake, non-verbal, chronically ill appearing, bair-hugger on Eyes - sclera anicteric, EOMI ENT - trach site clean and midline, HD cath site C/D/I Cardiac - regular rate/rhythm, no murmur Chest - scattered rhonchi Abdomen - mildly distended but soft, colostomy in LLQ, peg-tube in place Extremities - 2+ edema b/l lower extremities, 1+ edema b/l forearms Skin - warm and dry Neuro - Awake, non-verbal, quadriplegic  GU - penile/scrotal edema  Labs WBC 36.3, Hgb 8.5, Platelets 370 Na 136, K 5.2, Creat 0.33, Alb 2.2  Resolved Hospital Problem list   Hyperkalemia Tachycardia Hypotension Thrombocytosis  AKI  Assessment & Plan:  Septic shock 2nd to HCAP and UTI all present on admission. BP stable off pressors. WBC gradually increasing- also in the setting of steroids.  -Acinetobacter in sputum culture from 07/12/20 -Day 13/14 of ABx, currently on unasyn -Continue midodrine -Decrease Solu-cortef to 50 mg q12h  -Monitor for development of c.diff/intra-abdominal infection, consider abdominal imaging if clinically worsens  Acute on chronic hypoxic respiratory failure. Tracheostomy and ventilator dependent status. - full vent support - goal SpO2 > 92% - f/u CXR intermittently - bronchial hygiene  Hypothermia Likely from CRRT and adrenal insufficiency. Hopefully will be able to transition off CRRT soon.  -  Decrease solu cortef 50 mg to 0000000   Acute metabolic encephalopathy 2/2 to sepsis and renal failure. Improving/resolved. Neurological status appears stable, this appears to be patients baseline.  -Off sedation -Continue wellbutrin  Anasarca AKI from ATN 2nd to sepsis: resolved  Net negative 3.9L. Weight down 16 lbs since yesterday, question accuracy. Dry weight is unknown. -CRRT per nephrology; hopefully transition to HD soon -Dispo back to [facility] when able to tolerate HD   Anemia of critical illness, chronic disease and iron deficiency. Hgb stable. - s/p 1U PRBC transfusion on 7/02 and 7/07 - continue ferrous sulfate with vit C - f/u CBC - transfuse for Hb < 7  Severe protein calorie malnutrition (POA) - continue tube feeds  DM II poorly controlled with hyperglycemia. CBG 157-243. Required 29U SSI. -Increase Lantus from 12U to 15U BID -Consider adding tube feed coverage q4h if still persistently hyperglycemic -Continue moderate SSI   Paroxysmal atrial fibrillation. RRR today. HAS-BLED score of 2 for age and renal disease, indicating moderate risk for major bleed (2/100).  -Continue amiodarone -Transition heparin gtt to coumadin per pharmacy -Consider discontinuation of anti-coagulation given moderate bleeding risk, though patient is bed-bound and at risk for DVT   Sacral decubitus ulcer stage IV (POA) - wound care  Goals of Care - continue medical therapy - DNR in event of cardiac arrest  Best Practice (right click and "Reselect all SmartList Selections" daily)   Diet/type: tubefeeds DVT prophylaxis: systemic heparin, coumadin GI prophylaxis: PPI Lines: Dialysis Catheter and yes and it is still needed Foley:  Yes, and it is still needed Code Status:  limited Disposition: likely back to [facility] once he is off CRRT and tolerating HD  Labs:   CMP Latest Ref Rng & Units 07/23/2020 07/22/2020 07/22/2020  Glucose 70 - 99 mg/dL 206(H) 243(H) 296(H)  BUN 8 - 23 mg/dL  27(H) 29(H) 32(H)  Creatinine 0.61 - 1.24 mg/dL 0.33(L) 0.35(L) 0.34(L)  Sodium 135 - 145 mmol/L 136 135 135  Potassium 3.5 - 5.1 mmol/L 5.2(H) 5.1 4.7  Chloride 98 - 111 mmol/L 103 103 101  CO2 22 - 32 mmol/L '25 23 28  '$ Calcium 8.9 - 10.3 mg/dL 8.3(L) 8.0(L) 8.2(L)  Total Protein 6.5 - 8.1 g/dL - - -  Total Bilirubin 0.3 - 1.2 mg/dL - - -  Alkaline Phos 38 - 126 U/L - - -  AST 15 - 41 U/L - - -  ALT 0 - 44 U/L - - -    CBC Latest Ref Rng & Units 07/23/2020 07/22/2020 07/21/2020  WBC 4.0 - 10.5 K/uL 36.3(H) 34.3(H) 30.6(H)  Hemoglobin 13.0 - 17.0 g/dL 8.5(L) 7.9(L) 7.7(L)  Hematocrit 39.0 - 52.0 % 26.9(L) 24.0(L) 23.2(L)  Platelets 150 - 400 K/uL 370 431(H) 418(H)    ABG    Component Value Date/Time   PHART 7.295 (L) 07/19/2020 0016   PCO2ART 58.9 (H) 07/19/2020 0016   PO2ART 81 (L) 07/19/2020 0016   HCO3 28.9 (H) 07/19/2020 0016   TCO2 31 07/19/2020 0016   O2SAT 95.0 07/19/2020 0016    CBG (last 3)  Recent Labs    07/22/20 2305 07/23/20 0307 07/23/20 0704  GLUCAP 177* 210* 188*    Signature:  Sharion Settler PGY-2  Family Medicine

## 2020-07-23 NOTE — Plan of Care (Signed)
  Problem: Education: Goal: Knowledge of General Education information will improve Description: Including pain rating scale, medication(s)/side effects and non-pharmacologic comfort measures Outcome: Not Progressing   Problem: Health Behavior/Discharge Planning: Goal: Ability to manage health-related needs will improve Outcome: Not Progressing   Problem: Clinical Measurements: Goal: Ability to maintain clinical measurements within normal limits will improve Outcome: Not Progressing Goal: Will remain free from infection Outcome: Not Progressing Goal: Diagnostic test results will improve Outcome: Not Progressing Goal: Respiratory complications will improve Outcome: Not Progressing Goal: Cardiovascular complication will be avoided Outcome: Not Progressing   Problem: Activity: Goal: Risk for activity intolerance will decrease Outcome: Not Progressing   Problem: Nutrition: Goal: Adequate nutrition will be maintained Outcome: Not Progressing   Problem: Coping: Goal: Level of anxiety will decrease Outcome: Not Progressing   Problem: Elimination: Goal: Will not experience complications related to bowel motility Outcome: Not Progressing Goal: Will not experience complications related to urinary retention Outcome: Not Progressing   Problem: Pain Managment: Goal: General experience of comfort will improve Outcome: Not Progressing   Problem: Safety: Goal: Ability to remain free from injury will improve Outcome: Not Progressing   Problem: Skin Integrity: Goal: Risk for impaired skin integrity will decrease Outcome: Not Progressing   Problem: Activity: Goal: Ability to tolerate increased activity will improve Outcome: Not Progressing   Problem: Respiratory: Goal: Ability to maintain a clear airway and adequate ventilation will improve Outcome: Not Progressing   Problem: Role Relationship: Goal: Method of communication will improve Outcome: Not Progressing   

## 2020-07-23 NOTE — Progress Notes (Addendum)
ANTICOAGULATION CONSULT NOTE - Follow Up Consult  Pharmacy Consult for Heparin + Warfarin (Xarelto on hold with AKI) Indication: atrial fibrillation  Patient Measurements: Height: '5\' 6"'$  (167.6 cm) (Simultaneous filing. User may not have seen previous data.) Weight: 83.9 kg (184 lb 15.5 oz) IBW/kg (Calculated) : 63.8 Heparin Dosing Weight: 84 kg  Vital Signs: Temp: 97.5 F (36.4 C) (07/11 0300) Temp Source: Axillary (07/11 0300) BP: 126/73 (07/11 0600) Pulse Rate: 87 (07/11 0600)  Labs: Recent Labs    07/21/20 0442 07/21/20 1951 07/22/20 0302 07/22/20 0303 07/22/20 1634 07/23/20 0247  HGB 7.7*  --  7.9*  --   --  8.5*  HCT 23.2*  --  24.0*  --   --  26.9*  PLT 418*  --  431*  --   --  370  LABPROT  --   --  18.7*  --   --  17.9*  INR  --   --  1.6*  --   --  1.5*  HEPARINUNFRC 0.35  --   --  0.48  --  0.41  CREATININE 0.35*   < > 0.34*  --  0.35* 0.33*   < > = values in this interval not displayed.    Estimated Creatinine Clearance: 87.3 mL/min (A) (by C-G formula based on SCr of 0.33 mg/dL (L)).   Assessment: 70 years of age male from [place], on Xarelto for hx Afib. Patient with AKI on CRRT and Xarelto on hold. Last Xarelto dose (lower dose of 15 mg) was on 7/5 AM at [place]. Patient was transitioned to IV heparin and remains on therapy.     Heparin level remains therapeutic on current rate of 1600 units/hr. INR 1.5. Patient on amiodarone and Unasyn as well as low albumin. Hgb 8.5. Plt now wnl. No active bleeding noted.   Goal of Therapy:  Heparin level: 0.3-0.7 units/ml Monitor platelets by anticoagulation protocol: Yes   Plan:  - Warfarin '3mg'$  po x1 (lower dose again today to prevent over shooting goal of 2-3) - Daily PT/INR - Continue Heparin at 1600 units/hr - Daily Heparin level and CBC while on therapy - Will continue to monitor for any signs/symptoms of bleeding  Thank you for allowing pharmacy to be a part of this patient's care.  Cristela Felt,  PharmD Clinical Pharmacist  07/23/2020 7:00 AM

## 2020-07-23 NOTE — Progress Notes (Signed)
[facility] Progress Note    Assessment/ Plan:   AKI -baseline Cr 0.4. AKI likely secondary to ATN in the context of shock. CRRT initiated on 7/6 for anasarca with ineffective diuresis and uremia. Appreciate assistance from CCM for rij temp line placement 7/6 -Not on an ideal long term candidate for HD due to poor functional status, vent dependency. Dr. Candiss Norse voiced my concerns to both son and wife over the phone on 7/6 and they had wished to still proceed forward with renal replacement therapy. Appreciate palliative care's assistance.  -Continue with CRRT, will aim more so to be net neg 100cc/hr. All bags 4k/2.5ca > K this AM 5.2, change post filter to 2K.  Will keep him on CRRT for today to help offload more volume.  -Avoid nephrotoxic medications including NSAIDs and iodinated intravenous contrast exposure unless the latter is absolutely indicated.  Preferred narcotic agents for pain control are hydromorphone, fentanyl, and methadone. Morphine should not be used. Avoid Baclofen and avoid oral sodium phosphate and magnesium citrate based laxatives / bowel preps. Continue strict Input and Output monitoring. Will monitor the patient closely with you and intervene or adjust therapy as indicated by changes in clinical status/labs   Anasarca -CRRT as above, aiming for net neg 100cc/hr provided there are no inc'ed pressor requirements, persistent anasarca present at the moment. Can back off to net neg 50cc/hr if BP is unable to keep up.   Hyperkalemia - Per above, recurred; change post filter to 2K today.   CKD-MBD, hyperphosphatemia -managing with crrt, continue to monitor phos  Acute metabolic encephalopathy, likely related to sepsis and uremia -crrt as above, apparently alert and oriented at baseline.  Septic shock likely 2/2 HCAP+UTI -pressor support and abx per ccm, off pressors now. 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 Avoid iron for now in light of active infection -hgb 8.5  Subjective:   Seen and examined on CRRT. Remains off pressors.  Fio2 30%. Uop 10cc charted.  Net neg 2.3L yesterday, 3.9L for the admission.    Objective:   BP 119/65   Pulse 89   Temp 97.7 F (36.5 C) (Axillary)   Resp (!) 35   Ht _0  (1.676 m) Comment: Simultaneous filing. User may not have seen previous data.  Wt 83.9 kg   SpO2 95%   BMI 29.85 kg/m   Intake/Output Summary (Last 24 hours) at 07/23/2020 0802 Last data filed at 07/23/2020 0700 Gross per 24 hour  Intake 2398.54 ml  Output 4670 ml  Net -2271.46 ml    Weight change: -6.9 kg  Physical Exam: XNA:[id number] ill appearing CVS:s1s2, rrr Resp:trach, bl chest expansion YHC:WCBJ SEG:BTDVVOHY Neuro: quadriplegic, opening eyes but not responding to vocal stimuli nor following commands  Imaging: No results found.  Labs: BMET Recent Labs  Lab 07/20/20 0416 07/20/20 1611 07/21/20 0442 07/21/20 1951 07/22/20 0302 07/22/20 1634 07/23/20 0247  NA 136 134* 136 135 135 135 136  K 3.7 4.5 4.6 5.0 4.7 5.1 5.2*  CL 103 105 102 103 101 103 103  CO2 26 19* _1 GLUCOSE 248* 163* 138* 255* 296* 243* 206*  BUN 53* 44* 34* 28* 32* 29* 27*  CREATININE 0.51* 0.41* 0.35* 0.42* 0.34* 0.35* 0.33*  CALCIUM 7.8* 7.7* 8.2* 8.0* 8.2* 8.0* 8.3*  PHOS 4.4 4.0 3.5 2.8 2.5  2.7 2.5    CBC Recent Labs  Lab 07/20/20 0802 07/21/20 0442 07/22/20 0302 07/23/20 0247  WBC 29.6* 30.6* 34.3* 36.3*  HGB 9.4* 7.7* 7.9* 8.5*  HCT 28.9* 23.2* 24.0* 26.9*  MCV 72.6* 73.4* 72.3* 74.7*  PLT 407* 418* 431* 370     Medications:     amiodarone  200 mg Per Tube Daily   B-complex with vitamin C  1 tablet Per Tube Daily   buPROPion  75 mg Per Tube BID   chlorhexidine gluconate (MEDLINE KIT)  15 mL Mouth Rinse BID    Chlorhexidine Gluconate Cloth  6 each Topical Q1200   docusate  100 mg Per Tube BID   feeding supplement (PROSource TF)  90 mL Per Tube TID   ferrous sulfate  220 mg Per Tube Q breakfast   guaiFENesin  5 mL Per Tube BID   hydrocortisone sod succinate (SOLU-CORTEF) inj  50 mg Intravenous Q6H   insulin aspart  0-15 Units Subcutaneous Q4H   insulin glargine  12 Units Subcutaneous BID   mouth rinse  15 mL Mouth Rinse 10 times per day   midodrine  10 mg Per Tube Q8H   pantoprazole sodium  40 mg Per Tube QHS   polyethylene glycol  17 g Per Tube Daily   sodium chloride flush  10-40 mL Intracatheter Q12H   warfarin  3 mg Oral ONCE-1600   Warfarin - Pharmacist Dosing Inpatient   Does not apply [place] MD [facility] Pager [phone]

## 2020-07-24 ENCOUNTER — Inpatient Hospital Stay (HOSPITAL_COMMUNITY): Payer: 59

## 2020-07-24 DIAGNOSIS — R6521 Severe sepsis with septic shock: Secondary | ICD-10-CM | POA: Diagnosis not present

## 2020-07-24 DIAGNOSIS — A419 Sepsis, unspecified organism: Secondary | ICD-10-CM | POA: Diagnosis not present

## 2020-07-24 DIAGNOSIS — N179 Acute kidney failure, unspecified: Secondary | ICD-10-CM | POA: Diagnosis not present

## 2020-07-24 LAB — GLUCOSE, CAPILLARY
Glucose-Capillary: 257 mg/dL — ABNORMAL HIGH (ref 70–99)
Glucose-Capillary: 221 mg/dL — ABNORMAL HIGH (ref 70–99)
Glucose-Capillary: 189 mg/dL — ABNORMAL HIGH (ref 70–99)
Glucose-Capillary: 143 mg/dL — ABNORMAL HIGH (ref 70–99)
Glucose-Capillary: 193 mg/dL — ABNORMAL HIGH (ref 70–99)
Glucose-Capillary: 161 mg/dL — ABNORMAL HIGH (ref 70–99)

## 2020-07-24 LAB — RENAL FUNCTION PANEL
Albumin: 2.1 g/dL — ABNORMAL LOW (ref 3.5–5.0)
Albumin: 2.2 g/dL — ABNORMAL LOW (ref 3.5–5.0)
Anion gap: 4 — ABNORMAL LOW (ref 5–15)
Anion gap: 4 — ABNORMAL LOW (ref 5–15)
BUN: 27 mg/dL — ABNORMAL HIGH (ref 8–23)
BUN: 24 mg/dL — ABNORMAL HIGH (ref 8–23)
CO2: 29 mmol/L (ref 22–32)
CO2: 28 mmol/L (ref 22–32)
Calcium: 8.3 mg/dL — ABNORMAL LOW (ref 8.9–10.3)
Calcium: 8.4 mg/dL — ABNORMAL LOW (ref 8.9–10.3)
Chloride: 102 mmol/L (ref 98–111)
Chloride: 104 mmol/L (ref 98–111)
Creatinine, Ser: 0.33 mg/dL — ABNORMAL LOW (ref 0.61–1.24)
Creatinine, Ser: 0.32 mg/dL — ABNORMAL LOW (ref 0.61–1.24)
GFR, Estimated: >60 mL/min (ref >60)
GFR, Estimated: >60 mL/min (ref >60)
Glucose, Bld: 286 mg/dL — ABNORMAL HIGH (ref 70–99)
Glucose, Bld: 167 mg/dL — ABNORMAL HIGH (ref 70–99)
Phosphorus: 2.6 mg/dL (ref 2.5–4.6)
Phosphorus: 2.7 mg/dL (ref 2.5–4.6)
Potassium: 4.6 mmol/L (ref 3.5–5.1)
Potassium: 4.9 mmol/L (ref 3.5–5.1)
Sodium: 135 mmol/L (ref 135–145)
Sodium: 136 mmol/L (ref 135–145)

## 2020-07-24 LAB — CBC WITH DIFFERENTIAL/PLATELET
Abs Immature Granulocytes: 1.2 10*3/uL — ABNORMAL HIGH (ref 0.00–0.07)
Basophils Absolute: 0.1 10*3/uL (ref 0.0–0.1)
Basophils Relative: 0 %
Eosinophils Absolute: 0 10*3/uL (ref 0.0–0.5)
Eosinophils Relative: 0 %
HCT: 26 % — ABNORMAL LOW (ref 39.0–52.0)
Hemoglobin: 8.2 g/dL — ABNORMAL LOW (ref 13.0–17.0)
Immature Granulocytes: 3 %
Lymphocytes Relative: 3 %
Lymphs Abs: 1.3 10*3/uL (ref 0.7–4.0)
MCH: 23.5 pg — ABNORMAL LOW (ref 26.0–34.0)
MCHC: 31.5 g/dL (ref 30.0–36.0)
MCV: 74.5 fL — ABNORMAL LOW (ref 80.0–100.0)
Monocytes Absolute: 1.7 10*3/uL — ABNORMAL HIGH (ref 0.1–1.0)
Monocytes Relative: 4 %
Neutro Abs: 36.3 10*3/uL — ABNORMAL HIGH (ref 1.7–7.7)
Neutrophils Relative %: 90 %
Platelets: 488 10*3/uL — ABNORMAL HIGH (ref 150–400)
RBC: 3.49 MIL/uL — ABNORMAL LOW (ref 4.22–5.81)
RDW: 31.4 % — ABNORMAL HIGH (ref 11.5–15.5)
WBC: 40.6 10*3/uL — ABNORMAL HIGH (ref 4.0–10.5)
nRBC: 3 % — ABNORMAL HIGH (ref 0.0–0.2)

## 2020-07-24 LAB — CBC
HCT: 24.9 % — ABNORMAL LOW (ref 39.0–52.0)
Hemoglobin: 7.8 g/dL — ABNORMAL LOW (ref 13.0–17.0)
MCH: 23.7 pg — ABNORMAL LOW (ref 26.0–34.0)
MCHC: 31.3 g/dL (ref 30.0–36.0)
MCV: 75.7 fL — ABNORMAL LOW (ref 80.0–100.0)
Platelets: 448 10*3/uL — ABNORMAL HIGH (ref 150–400)
RBC: 3.29 MIL/uL — ABNORMAL LOW (ref 4.22–5.81)
RDW: 30 % — ABNORMAL HIGH (ref 11.5–15.5)
WBC: 38.6 10*3/uL — ABNORMAL HIGH (ref 4.0–10.5)
nRBC: 2.4 % — ABNORMAL HIGH (ref 0.0–0.2)

## 2020-07-24 LAB — HEPATIC FUNCTION PANEL
ALT: 105 U/L — ABNORMAL HIGH (ref 0–44)
AST: 214 U/L — ABNORMAL HIGH (ref 15–41)
Albumin: 2.3 g/dL — ABNORMAL LOW (ref 3.5–5.0)
Alkaline Phosphatase: 389 U/L — ABNORMAL HIGH (ref 38–126)
Bilirubin, Direct: 0.5 mg/dL — ABNORMAL HIGH (ref 0.0–0.2)
Indirect Bilirubin: 0.4 mg/dL (ref 0.3–0.9)
Total Bilirubin: 0.9 mg/dL (ref 0.3–1.2)
Total Protein: 8.4 g/dL — ABNORMAL HIGH (ref 6.5–8.1)

## 2020-07-24 LAB — PROTIME-INR
INR: 1.5 — ABNORMAL HIGH (ref 0.8–1.2)
Prothrombin Time: 18.5 seconds — ABNORMAL HIGH (ref 11.4–15.2)

## 2020-07-24 LAB — MAGNESIUM: Magnesium: 2.6 mg/dL — ABNORMAL HIGH (ref 1.7–2.4)

## 2020-07-24 LAB — HEPARIN LEVEL (UNFRACTIONATED): Heparin Unfractionated: 0.64 IU/mL (ref 0.30–0.70)

## 2020-07-24 MED ORDER — APIXABAN 5 MG PO TABS
5.0000 mg | ORAL_TABLET | Freq: Two times a day (BID) | ORAL | Status: DC
  Administered 2020-07-24 – 2020-07-27 (×7): 5 mg
  Filled 2020-07-24 (×7): qty 1

## 2020-07-24 MED ORDER — VANCOMYCIN HCL IN DEXTROSE 1-5 GM/200ML-% IV SOLN
1000.0000 mg | INTRAVENOUS | Status: DC
  Administered 2020-07-25 – 2020-07-26 (×2): 1000 mg via INTRAVENOUS
  Filled 2020-07-24 (×3): qty 200

## 2020-07-24 MED ORDER — INSULIN GLARGINE 100 UNIT/ML ~~LOC~~ SOLN
20.0000 [IU] | Freq: Two times a day (BID) | SUBCUTANEOUS | Status: DC
  Administered 2020-07-24 – 2020-07-28 (×9): 20 [IU] via SUBCUTANEOUS
  Filled 2020-07-24 (×11): qty 0.2

## 2020-07-24 MED ORDER — INSULIN ASPART 100 UNIT/ML IJ SOLN
0.0000 [IU] | INTRAMUSCULAR | Status: DC
  Administered 2020-07-24: 8 [IU] via SUBCUTANEOUS
  Administered 2020-07-24: 4 [IU] via SUBCUTANEOUS
  Administered 2020-07-24: 3 [IU] via SUBCUTANEOUS
  Administered 2020-07-24: 4 [IU] via SUBCUTANEOUS
  Administered 2020-07-25: 3 [IU] via SUBCUTANEOUS
  Administered 2020-07-25 (×2): 4 [IU] via SUBCUTANEOUS
  Administered 2020-07-25: 7 [IU] via SUBCUTANEOUS
  Administered 2020-07-26 (×2): 3 [IU] via SUBCUTANEOUS
  Administered 2020-07-26: 4 [IU] via SUBCUTANEOUS
  Administered 2020-07-26: 3 [IU] via SUBCUTANEOUS
  Administered 2020-07-26 – 2020-07-27 (×3): 4 [IU] via SUBCUTANEOUS
  Administered 2020-07-27: 3 [IU] via SUBCUTANEOUS
  Administered 2020-07-27 – 2020-07-28 (×4): 4 [IU] via SUBCUTANEOUS
  Administered 2020-07-28 – 2020-07-29 (×3): 3 [IU] via SUBCUTANEOUS

## 2020-07-24 MED ORDER — INSULIN ASPART 100 UNIT/ML IJ SOLN
4.0000 [IU] | INTRAMUSCULAR | Status: DC
  Administered 2020-07-24 – 2020-07-26 (×11): 4 [IU] via SUBCUTANEOUS

## 2020-07-24 MED ORDER — WARFARIN SODIUM 5 MG PO TABS: 5.0000 mg | ORAL_TABLET | Freq: Once | ORAL | Status: DC

## 2020-07-24 MED ORDER — VANCOMYCIN HCL 1750 MG/350ML IV SOLN
1750.0000 mg | Freq: Once | INTRAVENOUS | Status: AC
  Administered 2020-07-24: 1750 mg via INTRAVENOUS
  Filled 2020-07-24: qty 350

## 2020-07-24 NOTE — Progress Notes (Signed)
[facility] Progress Note    Assessment/ Plan:   AKI -baseline Cr 0.4. AKI likely secondary to ATN in the context of shock. CRRT initiated on 7/6 for anasarca with ineffective diuresis and uremia. Appreciate assistance from CCM for rij temp line placement 7/6 -Not on an ideal long term candidate for HD due to poor functional status, vent dependency. Dr. Candiss Norse voiced this concern to both son and wife over the phone on 7/6 and they had wished to still proceed forward with renal replacement therapy. Appreciate palliative care's assistance.  I again discussed concern for long term dialysis to wife and son today; they remain hopeful for renal recovery.  -Avoid nephrotoxic medications including NSAIDs and iodinated intravenous contrast exposure unless the latter is absolutely indicated.  Preferred narcotic agents for pain control are hydromorphone, fentanyl, and methadone. Morphine should not be used. Avoid Baclofen and avoid oral sodium phosphate and magnesium citrate based laxatives / bowel preps. Continue strict Input and Output monitoring. Will monitor the patient closely with you and intervene or adjust therapy as indicated by changes in clinical status/labs   Anasarca: improving - net neg >6L over past few days and in light of worsening hypotension will back off to net even for now to allow time for reequlibration of fluid back into vascular space  Hyperkalemia: resolved cont current 4K/4K/2K dialysate flows.  CKD-MBD, hyperphosphatemia -managing with crrt, continue to monitor phos  Acute metabolic encephalopathy, likely related to sepsis and uremia -crrt as above, apparently alert and oriented at baseline.  Septic shock likely 2/2 HCAP+UTI: rising WBC to 40k+ and hypothermia, worsening hypotension -pressor support and abx per ccm, on pressors now. On midodrine -primary recollecting cultures, imaging  Chronic respiratory failure s/p trach -vent mgmt per ccm  Quadriplegia  2/2 compressive myelopathy -resides at [facility] -appreciate palliative care's assistance  Microcytic anemia -transfuse for hgb <7. Avoid iron for now in light of active infection -hgb 8.2  Subjective:   Seen and examined on CRRT. Back on  pressors with hypothermia too -- reeval for infection.  Fio2 30%. Uop 5cc charted.  Net neg 2.2L yesterday, 6.4L for the admission.    Objective:   BP (!) 84/43   Pulse 69   Temp (!) 97.5 F (36.4 C) (Axillary)   Resp 20   Ht 5' 6"  (1.676 m) Comment: Simultaneous filing. User may not have seen previous data.  Wt 82.7 kg   SpO2 97%   BMI 29.43 kg/m   Intake/Output Summary (Last 24 hours) at 07/24/2020 1318 Last data filed at 07/24/2020 1300 Gross per 24 hour  Intake 2723.92 ml  Output 4887 ml  Net -2163.08 ml    Weight change: -1.2 kg  Physical Exam: HUO:[id number] ill appearing CVS:s1s2, rrr Resp:trach, bl chest expansion MCE:YEMV VKP:QAESLPNP Neuro: quadriplegic, opening eyes   Imaging: No results found.  Labs: BMET Recent Labs  Lab 07/21/20 0442 07/21/20 1951 07/22/20 0302 07/22/20 1634 07/23/20 0247 07/23/20 1600 07/24/20 0306  NA 136 135 135 135 136 136 135  K 4.6 5.0 4.7 5.1 5.2* 4.2 4.6  CL 102 103 101 103 103 103 102  CO2 27 27 28 23 25 29 29   GLUCOSE 138* 255* 296* 243* 206* 214* 286*  BUN 34* 28* 32* 29* 27* 26* 27*  CREATININE 0.35* 0.42* 0.34* 0.35* 0.33* 0.38* 0.33*  CALCIUM 8.2* 8.0* 8.2* 8.0* 8.3* 8.2* 8.3*  PHOS 3.5 2.8 2.5 2.7 2.5 2.6 2.6    CBC Recent Labs  Lab 07/22/20 0302 07/23/20 0247  07/24/20 0306 07/24/20 1009  WBC 34.3* 36.3* 38.6* 40.6*  NEUTROABS  --   --   --  36.3*  HGB 7.9* 8.5* 7.8* 8.2*  HCT 24.0* 26.9* 24.9* 26.0*  MCV 72.3* 74.7* 75.7* 74.5*  PLT 431* 370 448* 488*     Medications:     amiodarone  200 mg Per Tube Daily   apixaban  5 mg Per Tube BID   B-complex with vitamin C  1 tablet Per Tube Daily   buPROPion  75 mg Per Tube BID   chlorhexidine gluconate (MEDLINE  KIT)  15 mL Mouth Rinse BID   Chlorhexidine Gluconate Cloth  6 each Topical Q1200   docusate  100 mg Per Tube BID   feeding supplement (PROSource TF)  90 mL Per Tube TID   ferrous sulfate  220 mg Per Tube Q breakfast   guaiFENesin  5 mL Per Tube BID   hydrocortisone sod succinate (SOLU-CORTEF) inj  50 mg Intravenous Q12H   insulin aspart  0-20 Units Subcutaneous Q4H   insulin aspart  4 Units Subcutaneous Q4H   insulin glargine  20 Units Subcutaneous BID   mouth rinse  15 mL Mouth Rinse 10 times per day   midodrine  15 mg Per Tube Q8H   pantoprazole sodium  40 mg Per Tube QHS   polyethylene glycol  17 g Per Tube Daily   sodium chloride flush  10-40 mL Intracatheter Q12H     Jannifer Hick MD [facility] Pager [phone]

## 2020-07-24 NOTE — Progress Notes (Signed)
Inpatient Diabetes Program Recommendations  AACE/ADA: New Consensus Statement on Inpatient Glycemic Control   Target Ranges:  Prepandial:   less than 140 mg/dL      Peak postprandial:   less than 180 mg/dL (1-2 hours)      Critically ill patients:  140 - 180 mg/dL   Results for EZEKIAL, SKAHAN (MRN [id number]) as of 07/24/2020 09:32  Ref. Range 07/23/2020 07:04 07/23/2020 11:02 07/23/2020 15:18 07/23/2020 19:11 07/23/2020 23:08 07/24/2020 03:09 07/24/2020 07:06  Glucose-Capillary Latest Ref Range: 70 - 99 mg/dL 188 (H) 173 (H) 256 (H) 213 (H) 237 (H) 257 (H) 221 (H)    Review of Glycemic Control  Diabetes history: DM2 Outpatient Diabetes medications: Lantus 30 units daily, Regular 2-10 units Q6H Current orders for Inpatient glycemic control: Lantus 15 units BID, Novolog 0-15 units Q4H; Solucortef 50 mg Q12H, Osmolite @ 50 ml/hr  Inpatient Diabetes Program Recommendations:    Insulin: Please consider increasing Lantus to 20 units BID if steroids are continued as ordered. Also, please consider ordering Novolog 6 units Q4H for tube feeding coverage. If tube feeding is stopped or held then Novolog tube feeding coverage should also be stopped or held.  Thanks, Barnie Alderman, RN, MSN, CDE Diabetes Coordinator Inpatient Diabetes Program [phone] (Team Pager from 8am to 5pm)

## 2020-07-24 NOTE — Progress Notes (Signed)
Pharmacy Antibiotic Note  Luis Orr is a 70 y.o. male admitted on 06/20/2020 with concern for worsening septic shock. He has been on Unasyn for multi-drug resistant acinetobacter baumannii PNA infection. Patient is from [place] and has a history of other multi-drug resistant organisms such as ESBL. Pharmacy has been consulted for vancomycin and dosing. Concerned for source of infection other than pneumonia. Will order blood cultures and follow up for improvements.    Plan: Vancomycin '1750mg'$  x1 Vancomycin '1000mg'$  q24h  Continue Unasyn  F/u cultures, levels as indicated   Height: '5\' 6"'$  (167.6 cm) (Simultaneous filing. User may not have seen previous data.) Weight: 82.7 kg (182 lb 5.1 oz) IBW/kg (Calculated) : 63.8  Temp (24hrs), Avg:95.6 F (35.3 C), Min:94.2 F (34.6 C), Max:97.6 F (36.4 C)  Recent Labs  Lab 07/21/20 0442 07/21/20 1951 07/22/20 0302 07/22/20 1634 07/23/20 0247 07/23/20 1600 07/24/20 0306 07/24/20 1009  WBC 30.6*  --  34.3*  --  36.3*  --  38.6* 40.6*  CREATININE 0.35*   < > 0.34* 0.35* 0.33* 0.38* 0.33*  --    < > = values in this interval not displayed.    Estimated Creatinine Clearance: 86.8 mL/min (A) (by C-G formula based on SCr of 0.33 mg/dL (L)).    Allergies  Allergen Reactions   Adhesive [Tape] Rash    Antimicrobials this admission: Cefepime 6/29 x1 Merrem 6/30>>7/2 Vancomycin 6/29>>7/1, 7/12>> Unasyn 7/2>>   Microbiology results: 7/12 BCx: f/u 7/12 Resp cx: f/u 7/1 UCx: multiple species   6/30 Sputum: acinetobacter baumannii  6/29 MRSA PCR: positive  Thank you for allowing pharmacy to be a part of this patient's care.  Phillis Haggis 07/24/2020 3:35 PM

## 2020-07-24 NOTE — Progress Notes (Signed)
ANTICOAGULATION CONSULT NOTE - Follow Up Consult  Pharmacy Consult for Heparin + Warfarin (Xarelto on hold with AKI) Indication: atrial fibrillation  Patient Measurements: Height: '5\' 6"'$  (167.6 cm) (Simultaneous filing. User may not have seen previous data.) Weight: 82.7 kg (182 lb 5.1 oz) IBW/kg (Calculated) : 63.8 Heparin Dosing Weight: 84 kg  Vital Signs: Temp: 94.5 F (34.7 C) (07/12 0336) Temp Source: Oral (07/12 0336) BP: 117/63 (07/12 0645) Pulse Rate: 68 (07/12 0645)  Labs: Recent Labs    07/22/20 0302 07/22/20 0303 07/22/20 1634 07/23/20 0247 07/23/20 1600 07/24/20 0112 07/24/20 0306  HGB 7.9*  --   --  8.5*  --   --  7.8*  HCT 24.0*  --   --  26.9*  --   --  24.9*  PLT 431*  --   --  370  --   --  448*  LABPROT 18.7*  --   --  17.9*  --   --  18.5*  INR 1.6*  --   --  1.5*  --   --  1.5*  HEPARINUNFRC  --  0.48  --  0.41  --  0.64  --   CREATININE 0.34*  --    < > 0.33* 0.38*  --  0.33*   < > = values in this interval not displayed.    Estimated Creatinine Clearance: 86.8 mL/min (A) (by C-G formula based on SCr of 0.33 mg/dL (L)).   Assessment: 70 years of age male from [place], on Xarelto for hx Afib. Patient with AKI on CRRT and Xarelto on hold. Last Xarelto dose (lower dose of 15 mg) was on 7/5 AM at [place]. Patient was transitioned to IV heparin and remains on therapy.     Heparin level remains therapeutic on current rate of 1600 units/hr though at the upper end. INR 1.5 again today. Patient on amiodarone and Unasyn as well as low albumin. Hgb 7.8. Plt elevated. No active bleeding noted.   Goal of Therapy:  Heparin level: 0.3-0.7 units/ml Monitor platelets by anticoagulation protocol: Yes   Plan:  - Warfarin '5mg'$  po x1  - Daily PT/INR - Decrease heparin slight to 1550 units/hr to ensure remains in therapeutic range - Daily Heparin level and CBC while on therapy - Will continue to monitor for any signs/symptoms of bleeding  Thank you for allowing  pharmacy to be a part of this patient's care.  Cristela Felt, PharmD Clinical Pharmacist  07/24/2020 7:20 AM

## 2020-07-24 NOTE — Progress Notes (Addendum)
[facility] Infectious Diseases Follow Up Note  Patient Identification: Patient Name: Luis Orr MRN: [id number] [place] Date: 06/17/2020  6:13 PM Age: 70 y.o.Today's Date: 07/24/2020   Reason for Visit: septic shock, needing to broadeden antibiotics   Active Problems:   Septic shock ([place])   Pressure injury of skin   Acute respiratory failure with hypoxia (HCC)   AKI (acute kidney injury) ([place])   Antibiotics: Vancomycin 6/29-6/30                    Meropenem 6/29-7/2                    Unasyn 7/2-current   Lines/Tubes: RT IJ HD catheter , LLQ colostomy, Urethral catheter , tracheostomy, PIVs    Assessment 89 Y O male from [facility] with hx  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 Afebrile on presentation but had leukocytosis of 23, requiring pressors, possibly in the setting of HAP. Resp cx 6/30 growing Amp S Acinetobacter baumannii and he is currently on day 10 of Unasyn   Shock: Concern for septic shock given uptrending leukocytosis, hypothermia; on low dose levophed intermittently while on CRRT while being treated for MDR Acinetobacter Baumannii PNA with Unasyn  - Concern for CLABSI given RT IJ HD Cathter - Less likely to be related to PNA given stable oxygen requirements and being on appropriate tx for HAP  ESBL Klebsiella pneumoniae in Urine cultures in 09/2019   Stah auricularis 1/2 sets: likely a contaminant  Leukocytosis: Infective vs reactive, has been on stress dose steroids Rt Ureterolithiasis with ureteral stent Acute Renal Failure - On CRRT, Nephrology following  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 Thank you for the consult. Please page with pertinent questions or concerns.  ______________________________________________________________________ Subjective patient seen and examined at the bedside. On low dose levophed, fi02 at 30%  Vitals BP (!) 95/52   Pulse 68   Temp (!) 97.5 F (36.4 C) (Axillary)   Resp (!) 22   Ht '5\' 6"'$  (1.676 m) Comment: Simultaneous filing. User may not have seen previous data.  Wt 82.7 kg   SpO2 98%   BMI 29.43 kg/m     Physical Exam Constitutional:  lying in bed, appears sick, CRRT ongoing     Comments:   Cardiovascular:     Rate and Rhythm: Normal rate and regular rhythm.     Heart sounds:   Pulmonary:     Effort: Trach to vent     Comments:   Abdominal:     Palpations: Abdomen is soft.     Tenderness: LLQ colostomy with liquid stool   Musculoskeletal:        General: No swelling or tenderness.   Skin:    Comments: No obvious lesions or rashes   Neurological:     General: unable to assess    Psychiatric:  Mood and Affect: unable to assess   Pertinent Microbiology Results for orders placed or performed during the hospital encounter of 06/28/2020  Urine culture     Status: Abnormal   Collection Time: 06/20/2020  6:24 PM   Specimen: Urine, Random  Result Value Ref Range Status   Specimen Description URINE, RANDOM  Final   Special Requests   Final    NONE Performed at [facility], [address], [place], [place] [address]    Culture MULTIPLE SPECIES PRESENT, SUGGEST RECOLLECTION (A)  Final   Report Status 07/13/2020 FINAL  Final  Culture, blood (routine x 2)     Status: Abnormal   Collection Time: 07/08/2020  6:42 PM   Specimen: BLOOD LEFT FOREARM  Result Value  Ref Range Status   Specimen Description BLOOD LEFT FOREARM  Final   Special Requests   Final    BOTTLES DRAWN AEROBIC AND ANAEROBIC Blood Culture results may not be optimal due to an inadequate volume of blood received in culture bottles   Culture  Setup Time   Final    ANAEROBIC BOTTLE ONLY GRAM POSITIVE COCCI CRITICAL RESULT CALLED TO, READ BACK BY AND VERIFIED WITH: G ABBOTT PHARMD 07/13/20 0207 JDW    Culture (A)  Final    STAPHYLOCOCCUS AURICULARIS THE SIGNIFICANCE OF ISOLATING THIS ORGANISM FROM A SINGLE SET OF BLOOD CULTURES WHEN MULTIPLE SETS ARE DRAWN IS UNCERTAIN. PLEASE NOTIFY THE MICROBIOLOGY DEPARTMENT WITHIN ONE WEEK IF SPECIATION AND SENSITIVITIES ARE REQUIRED. Performed at [facility], [place] [address], [place], [place] [address]    Report Status 07/14/2020 FINAL  Final  Blood Culture ID Panel (Reflexed)     Status: Abnormal   Collection Time: 07/03/2020  6:42 PM  Result Value Ref Range Status   Enterococcus faecalis NOT DETECTED NOT DETECTED Final   Enterococcus Faecium NOT DETECTED NOT DETECTED Final   Listeria monocytogenes NOT DETECTED NOT DETECTED Final   Staphylococcus species DETECTED (A) NOT DETECTED Final    Comment: CRITICAL RESULT CALLED TO, READ BACK BY AND VERIFIED WITH: G ABBOTT PHARMD 07/13/20 0207 JDW    Staphylococcus aureus (BCID) NOT DETECTED NOT DETECTED Final   Staphylococcus epidermidis NOT DETECTED NOT DETECTED Final   Staphylococcus lugdunensis NOT DETECTED NOT DETECTED Final   Streptococcus species NOT DETECTED NOT DETECTED Final   Streptococcus agalactiae NOT DETECTED NOT DETECTED Final   Streptococcus pneumoniae NOT DETECTED NOT DETECTED Final   Streptococcus pyogenes NOT DETECTED NOT DETECTED Final   A.calcoaceticus-baumannii NOT DETECTED NOT DETECTED Final   Bacteroides fragilis NOT DETECTED NOT DETECTED Final   Enterobacterales NOT DETECTED NOT DETECTED Final   Enterobacter cloacae complex NOT DETECTED NOT DETECTED Final   Escherichia  coli NOT DETECTED NOT DETECTED Final   Klebsiella aerogenes NOT DETECTED NOT DETECTED Final   Klebsiella oxytoca NOT DETECTED NOT DETECTED Final   Klebsiella pneumoniae NOT DETECTED NOT DETECTED Final   Proteus species NOT DETECTED NOT DETECTED Final   Salmonella species NOT DETECTED NOT DETECTED Final   Serratia marcescens NOT DETECTED NOT DETECTED Final   Haemophilus influenzae NOT DETECTED NOT DETECTED Final   Neisseria meningitidis NOT DETECTED NOT DETECTED Final   Pseudomonas aeruginosa NOT DETECTED NOT DETECTED Final   Stenotrophomonas maltophilia NOT DETECTED NOT DETECTED Final   Candida albicans NOT DETECTED NOT DETECTED Final   Candida auris NOT DETECTED NOT DETECTED Final   Candida glabrata NOT DETECTED NOT DETECTED Final   Candida krusei NOT DETECTED NOT DETECTED Final   Candida  parapsilosis NOT DETECTED NOT DETECTED Final   Candida tropicalis NOT DETECTED NOT DETECTED Final   Cryptococcus neoformans/gattii NOT DETECTED NOT DETECTED Final    Comment: Performed at [facility], [address], [place], [place] [address]  Resp Panel by RT-PCR (Flu A&B, Covid) Nasopharyngeal Swab     Status: None   Collection Time: 06/30/2020  7:22 PM   Specimen: Nasopharyngeal Swab; Nasopharyngeal(NP) swabs in vial transport medium  Result Value Ref Range Status   SARS Coronavirus 2 by RT PCR NEGATIVE NEGATIVE Final    Comment: (NOTE) SARS-CoV-2 target nucleic acids are NOT DETECTED.  The SARS-CoV-2 RNA is generally detectable in upper respiratory specimens during the acute phase of infection. The lowest concentration of SARS-CoV-2 viral copies this assay can detect is 138 copies/mL. A negative result does not preclude SARS-Cov-2 infection and should not be used as the sole basis for treatment or other patient management decisions. A negative result may occur with  improper specimen collection/handling, submission of specimen other than nasopharyngeal swab, presence of viral mutation(s)  within the areas targeted by this assay, and inadequate number of viral copies(<138 copies/mL). A negative result must be combined with clinical observations, patient history, and epidemiological information. The expected result is Negative.  Fact Sheet for Patients:  EntrepreneurPulse.com.au  Fact Sheet for Healthcare Providers:  IncredibleEmployment.be  This test is no t yet approved or cleared by the [place] FDA and  has been authorized for detection and/or diagnosis of SARS-CoV-2 by FDA under an Emergency Use Authorization (EUA). This EUA will remain  in effect (meaning this test can be used) for the duration of the COVID-19 declaration under Section 564(b)(1) of the Act, 21 U.S.C.section 360bbb-3(b)(1), unless the authorization is terminated  or revoked sooner.       Influenza A by PCR NEGATIVE NEGATIVE Final   Influenza B by PCR NEGATIVE NEGATIVE Final    Comment: (NOTE) The Xpert Xpress SARS-CoV-2/FLU/RSV plus assay is intended as an aid in the diagnosis of influenza from Nasopharyngeal swab specimens and should not be used as a sole basis for treatment. Nasal washings and aspirates are unacceptable for Xpert Xpress SARS-CoV-2/FLU/RSV testing.  Fact Sheet for Patients: EntrepreneurPulse.com.au  Fact Sheet for Healthcare Providers: IncredibleEmployment.be  This test is not yet approved or cleared by the [place] FDA and has been authorized for detection and/or diagnosis of SARS-CoV-2 by FDA under an Emergency Use Authorization (EUA). This EUA will remain in effect (meaning this test can be used) for the duration of the COVID-19 declaration under Section 564(b)(1) of the Act, 21 U.S.C. section 360bbb-3(b)(1), unless the authorization is terminated or revoked.  Performed at [facility], [place] [address], [place], [place] [address]   Culture, blood (routine x 2)     Status: None    Collection Time: 06/25/2020  7:23 PM   Specimen: BLOOD  Result Value Ref Range Status   Specimen Description BLOOD RIGHT ANTECUBITAL  Final   Special Requests   Final    BOTTLES DRAWN AEROBIC AND ANAEROBIC Blood Culture adequate volume   Culture   Final    NO GROWTH 5 DAYS Performed at [facility], [place] [address] [place], [place] [address]    Report Status 07/16/2020 FINAL  Final  MRSA Next Gen by PCR, Nasal     Status: Abnormal   Collection Time: 07/09/2020 10:29 PM   Specimen: Nasal Mucosa; Nasal Swab  Result Value Ref Range Status   MRSA by PCR Next  Gen DETECTED (A) NOT DETECTED Final    Comment: RESULT CALLED TO, READ BACK BY AND VERIFIED WITH: Ayesha Rumpf RN 07/12/20 0428 JDW (NOTE) The GeneXpert MRSA Assay (FDA approved for NASAL specimens only), is one component of a comprehensive MRSA colonization surveillance program. It is not intended to diagnose MRSA infection nor to guide or monitor treatment for MRSA infections. Test performance is not FDA approved in patients less than 26 years old. Performed at [facility], [place] [address], [place], [place] [address]   Culture, Respiratory w Gram Stain     Status: None   Collection Time: 07/12/20  3:48 PM   Specimen: Tracheal Aspirate; Respiratory  Result Value Ref Range Status   Specimen Description TRACHEAL ASPIRATE  Final   Special Requests NONE  Final   Gram Stain   Final    MODERATE WBC PRESENT, PREDOMINANTLY PMN FEW GRAM NEGATIVE RODS FEW GRAM POSITIVE COCCI IN PAIRS    Culture   Final    MODERATE ACINETOBACTER BAUMANNII MULTI-DRUG RESISTANT ORGANISM RESULT CALLED TO, READ BACK BY AND VERIFIED WITH: RN J.BEABRAUT ON [id number] AT 1410 BY E.PARRISH Performed at [facility], [place] [address], [place], [place] [address]    Report Status 07/14/2020 FINAL  Final   Organism ID, Bacteria ACINETOBACTER BAUMANNII  Final      Susceptibility   Acinetobacter baumannii - MIC*    CEFTAZIDIME 32 RESISTANT Resistant      CIPROFLOXACIN >=4 RESISTANT Resistant     GENTAMICIN >=16 RESISTANT Resistant     IMIPENEM >=16 RESISTANT Resistant     PIP/TAZO >=128 RESISTANT Resistant     TRIMETH/SULFA >=320 RESISTANT Resistant     AMPICILLIN/SULBACTAM 8 SENSITIVE Sensitive     * MODERATE ACINETOBACTER BAUMANNII  Culture, Urine     Status: Abnormal   Collection Time: 07/13/20 11:26 AM   Specimen: Urine, Random  Result Value Ref Range Status   Specimen Description URINE, RANDOM  Final   Special Requests   Final    NONE Performed at [facility], [place] [address] [place], [place] [address]    Culture MULTIPLE SPECIES PRESENT, SUGGEST RECOLLECTION (A)  Final   Report Status 07/14/2020 FINAL  Final      Pertinent Lab. CBC Latest Ref Rng & Units 07/24/2020 07/24/2020 07/23/2020  WBC 4.0 - 10.5 K/uL 40.6(H) 38.6(H) 36.3(H)  Hemoglobin 13.0 - 17.0 g/dL 8.2(L) 7.8(L) 8.5(L)  Hematocrit 39.0 - 52.0 % 26.0(L) 24.9(L) 26.9(L)  Platelets 150 - 400 K/uL 488(H) 448(H) 370   CMP Latest Ref Rng & Units 07/24/2020 07/23/2020 07/23/2020  Glucose 70 - 99 mg/dL 286(H) 214(H) 206(H)  BUN 8 - 23 mg/dL 27(H) 26(H) 27(H)  Creatinine 0.61 - 1.24 mg/dL 0.33(L) 0.38(L) 0.33(L)  Sodium 135 - 145 mmol/L 135 136 136  Potassium 3.5 - 5.1 mmol/L 4.6 4.2 5.2(H)  Chloride 98 - 111 mmol/L 102 103 103  CO2 22 - 32 mmol/L '29 29 25  '$ Calcium 8.9 - 10.3 mg/dL 8.3(L) 8.2(L) 8.3(L)  Total Protein 6.5 - 8.1 g/dL - - -  Total Bilirubin 0.3 - 1.2 mg/dL - - -  Alkaline Phos 38 - 126 U/L - - -  AST 15 - 41 U/L - - -  ALT 0 - 44 U/L - - -    Pertinent Imaging today Plain films and CT images have been personally visualized and interpreted; radiology reports have been reviewed. Decision making incorporated into the Impression / Recommendations.  CT abdomen/pelvis 07/17/20 IMPRESSION: 1.  Gastrostomy tube noted without evidence for fluid collection around the gastrostomy tube. 2. Marked atrophy of the right kidney with internal ureteral stent in  situ. Proximal loop is formed in the posterior aspect of the upper pole, presumably in an upper pole calyx although this cannot be confirmed on today's study. 3. Stone along the mid right ureter could be in the ureter or adjacent gonadal vein. 4. Patchy and consolidative airspace disease in both lower lungs suggesting multifocal pneumonia. Small bilateral pleural effusions. 5. Small volume ascites. 6. Diffuse body wall edema. 7. Aortic Atherosclerosis (ICD10-I70.0).  TTE   1. Very technically difficulat study. In one view (contrast A4C) the  right ventricle appears moderately enlarged with reduced function. Left  ventricular ejection fraction, by estimation, is 50 to 55%. The left  ventricle has low normal function. Left  ventricular endocardial border not optimally defined to evaluate regional  wall motion, even with contrast administration. There is mild concentric  left ventricular hypertrophy. Left ventricular diastolic parameters are  indeterminate.   2. The mitral valve was not assessed. No evidence of mitral valve  regurgitation.   3. The aortic valve was not assessed. Aortic valve regurgitation is not  visualized.   4. Aortic dilatation noted. There is mild dilatation of the aortic root,  measuring 40 mm.   I spent more than 35 minutes for this patient encounter including review of prior medical records, coordination of care  with greater than 50% of time being face to face/counseling and discussing diagnostics/treatment plan with the patient/family.  Electronically signed by:   Rosiland Oz, MD Infectious Disease Physician [facility] Pager: [phone]

## 2020-07-24 NOTE — TOC Initial Note (Signed)
Transition of Care [facility]) - Initial/Assessment Note    Patient Details  Name: Luis Orr MRN: [id number] Date of Birth: 02/12/1950  [facility] of [facility]) CM/SW Contact:    Ella Bodo, RN Phone Number: 07/24/2020, 4:15 PM  Clinical Narrative:   70 yo male from [facility] with hx of C3-C5 compressive myelopathy with functional quadriplegia and chronic trach/vent presented to ED with altered mental status and hypotension with recurrent HCAP. Originally, patient is from [facility]; he has a wife and two sons.  Palliative medicine team following; patient currently remains on full ventilator support and CRRT.  Family wishes to continue aggressive support at this time; TOC will continue to follow progress.                   Expected Discharge Plan: Long Term Acute Care (LTAC) Barriers to Discharge: Continued Medical Work up          Expected Discharge Plan and Services Expected Discharge Plan: [place] (LTAC)       Living arrangements for the past 2 months: Post-Acute Facility                                      Prior Living Arrangements/Services Living arrangements for the past 2 months: [place] Lives with:: Facility Resident                   Activities of Daily Living Home Assistive Devices/Equipment: Other (Comment) (from [facility]) ADL Screening (condition at time of admission) Patient's cognitive ability adequate to safely complete daily activities?: No Is the patient deaf or have difficulty hearing?: No Does the patient have difficulty seeing, even when wearing glasses/contacts?: No Does the patient have difficulty concentrating, remembering, or making decisions?: Yes Patient able to express need for assistance with ADLs?: No Does the patient have difficulty dressing or bathing?: Yes Independently performs ADLs?: No Communication: Dependent Is this a change from baseline?: Pre-admission  baseline Dressing (OT): Dependent Is this a change from baseline?: Pre-admission baseline Grooming: Dependent Is this a change from baseline?: Pre-admission baseline Feeding: Dependent Is this a change from baseline?: Pre-admission baseline Bathing: Dependent Is this a change from baseline?: Pre-admission baseline Toileting: Dependent Is this a change from baseline?: Pre-admission baseline In/Out Bed: Dependent Is this a change from baseline?: Pre-admission baseline Walks in Home: Dependent Is this a change from baseline?: Pre-admission baseline Does the patient have difficulty walking or climbing stairs?: Yes Weakness of Legs: Both Weakness of Arms/Hands: Both             Admission diagnosis:  Septic shock ([place]) [A41.9, R65.21] Hypotension, unspecified hypotension type [I95.9] Altered mental status, unspecified altered mental status type [R41.82] Patient Active Problem List   Diagnosis Date Noted   AKI (acute kidney injury) ([place])    Acute respiratory failure with hypoxia ([place])    Pressure injury of skin 07/13/2020   Acute on chronic respiratory failure with hypercapnia ([place])    HCAP (healthcare-associated pneumonia)    Sepsis ([employer]) 12/20/2019   Septic shock ([place]) 12/19/2019   Severe sepsis ([place]) 12/19/2019   History of ESBL Klebsiella pneumoniae infection 10/07/2019   History of MRSA infection 10/07/2019   Adynamic ileus ([place]) 10/07/2019   Sacral decubitus ulcer 10/07/2019   AMS (altered mental status) 07/17/2019   SIRS (systemic inflammatory response syndrome) ([place])    Ventilator dependence ([place])    Bacteremia  due to Staphylococcus 07/07/2019   Functional quadriplegia ([place]) 07/06/2019   Chronic respiratory failure ([place]) 07/06/2019   Sepsis secondary to UTI ([place]) 07/06/2019   Fecal impaction ([place]) 07/06/2019   HTN (hypertension) 07/06/2019   DM2 (diabetes mellitus, type 2) ([place]) 07/06/2019   PCP:  Townsend Roger, MD Pharmacy:   [facility], [address] [place]. [place] [address] Phone: [phone] Fax: [phone]     Social Determinants of Health (SDOH) Interventions    Readmission Risk Interventions No flowsheet data found.  Reinaldo Raddle, RN, BSN  Trauma/Neuro ICU Case Manager [phone]

## 2020-07-24 NOTE — Progress Notes (Signed)
NAME:  Luis Orr, MRN:  [id number], DOB:  05/04/1950, LOS: 21 ADMISSION DATE:  06/22/2020, CONSULTATION DATE:  07/06/2020 REFERRING MD:  Dr Francia Greaves, EDP CHIEF COMPLAINT:  septic shock    History of Present Illness:  70 yo male from [facility] with hx of C3-C5 compressive myelopathy with functional quadriplegia and chronic trach/vent presented to ED with altered mental status and hypotension with recurrent HCAP.  Has multiple recent admissions for sepsis secondary to various infections including UTIs, pneumonias and bacteremia in setting of MDR Klebsiella, MRSA and pseudomonas. Most recent admission September 2021 in setting of sepsis due to MDR Klebsiella with right ureteral calculus s/p double J ureteral stent placement and treated with meropenem x 10days.  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 Central line placed. Drainage from gastrostomy tube >> CT Abd without fluid collection around gastrostomy tube but with diffuse body wall edema. Echo EF 50-55%, mild LVH  7/6 Renal Replacement Therapy ; Code status changed to partial Code (DNR) 7/7 Transfuse 1U PRBC 7/9 add solu cortef 7/10 off pressors, weaning stress steroids  7/11 Pressors added back on again  Interim History / Subjective:  Awake, ventilated. Non-verbal.    Objective   Blood pressure 117/63, pulse 68, temperature (!) 94.5 F (34.7 C), temperature source Oral, resp. rate (!) 22, height '5\' 6"'$  (1.676 m), weight 82.7 kg, SpO2 98 %.    Vent Mode: PRVC FiO2 (%):  [30 %] 30 % Set Rate:  [22 bmp-35 bmp] 22 bmp Vt Set:  [380 mL] 380 mL PEEP:  [5 cmH20-8 cmH20] 5 cmH20 Plateau Pressure:  [21 cmH20-33 cmH20] 27 cmH20   Intake/Output Summary (Last 24 hours) at 07/24/2020 [id number] Last data filed at 07/24/2020 0700 Gross per 24 hour  Intake 2812.36 ml  Output 5083 ml  Net -2270.64 ml   Filed Weights   07/22/20 0500 07/23/20 0500 07/24/20 0500  Weight: 90.8 kg 83.9 kg 82.7 kg    Examination:  General - awake, non-verbal, chronically ill appearing, bair-hugger on Eyes - sclera anicteric, EOMI ENT - trach site clean and midline, HD cath site C/D/I Cardiac - regular rate/rhythm, no murmur Chest - scattered rhonchi, crackles right base  Abdomen - mildly distended but soft, colostomy in LLQ with liquid stool, peg-tube in place without drainage Extremities - 2+ edema b/l lower extremities, 1+ edema b/l forearms Skin - warm and dry Neuro - Awake, alert, non-verbal, quadriplegic  GU - penile/scrotal edema  Labs WBC 38.6, Hgb 7.8, Platelets 448 Na 135, K 4.6, Gluc 286, Cr 0.33  Resolved Hospital Problem list   Hyperkalemia Tachycardia Hypotension Thrombocytosis  AKI  Assessment & Plan:  Septic shock 2nd to HCAP and UTI all present on admission. Off Levophed. Midodrine increased yesterday. WBC gradually increasing, steroids were decreased yesterday. Clinically he appears about the same though there is concern given rising WBC, hypothermia  and intermittent hypotension requiring pressors.  -Acinetobacter in sputum culture from 07/12/20 -Day 14/14 of total Abx. Day 11 of Unasyn.  -KUB and CXR today -Repeat CBC with diff -Reasses need for abx pending diff and imaging -Continue midodrine 15 mg q8h -Continue steroids  Acute on chronic hypoxic  respiratory failure. Tracheostomy and ventilator dependent status. - full vent support - goal SpO2 > 92% - f/u CXR today - bronchial hygiene  Hypothermia 94.63F this AM, on bair-hugger. Likely related to CRRT and adrenal insufficiency. Must also consider worsening sepsis vs thermo-dysregulation.  -Continue steroids -Continue bair-hugger -Monitor vitals closely  DM II poorly controlled with hyperglycemia. CBG 173-286. Required 30U SSI. -Increase Lantus from 15U to 20U BID -Add 4U tube feed coverage q4h -Change to resistant SSI  Acute metabolic encephalopathy 2/2 to sepsis and renal failure. Appears at baseline. -Continue wellbutrin  Anasarca Net negative 6.2L since admission. Weight down 16 lbs since admission. Dry weight is unknown. -CRRT per nephrology; hopefully transition to iHD soon if stable off pressors -Dispo back to [facility] when able to tolerate iHD   Anemia of critical illness, chronic disease and iron deficiency. Hgb stable. - s/p 1U PRBC transfusion on 7/02 and 7/07 - continue ferrous sulfate with vit C - transfuse for Hb < 7  Severe protein calorie malnutrition (POA) Albumin 2.1.  - continue tube feeds  Paroxysmal atrial fibrillation. -Continue amiodarone -Stop warfarin bridge, transition to [place] per pharmacy  Sacral decubitus ulcer stage IV (POA) - wound care  Goals of Care - continue medical therapy - DNR in event of cardiac arrest  Best Practice (right click and "Reselect all SmartList Selections" daily)   Diet/type: tubefeeds DVT prophylaxis: systemic heparin, coumadin GI prophylaxis: PPI Lines: Dialysis Catheter and yes and it is still needed Foley:  Yes, and it is still needed Code Status:  limited Disposition: likely back to [facility] once he is off CRRT and tolerating HD  Labs:   CMP Latest Ref Rng & Units 07/24/2020 07/23/2020 07/23/2020  Glucose 70 - 99 mg/dL 286(H) 214(H) 206(H)  BUN 8 - 23 mg/dL 27(H) 26(H) 27(H)  Creatinine 0.61 -  1.24 mg/dL 0.33(L) 0.38(L) 0.33(L)  Sodium 135 - 145 mmol/L 135 136 136  Potassium 3.5 - 5.1 mmol/L 4.6 4.2 5.2(H)  Chloride 98 - 111 mmol/L 102 103 103  CO2 22 - 32 mmol/L '29 29 25  '$ Calcium 8.9 - 10.3 mg/dL 8.3(L) 8.2(L) 8.3(L)  Total Protein 6.5 - 8.1 g/dL - - -  Total Bilirubin 0.3 - 1.2 mg/dL - - -  Alkaline Phos 38 - 126 U/L - - -  AST 15 - 41 U/L - - -  ALT 0 - 44 U/L - - -    CBC Latest Ref Rng & Units 07/24/2020 07/23/2020 07/22/2020  WBC 4.0 - 10.5 K/uL 38.6(H) 36.3(H) 34.3(H)  Hemoglobin 13.0 - 17.0 g/dL 7.8(L) 8.5(L) 7.9(L)  Hematocrit 39.0 - 52.0 % 24.9(L) 26.9(L) 24.0(L)  Platelets 150 - 400 K/uL 448(H) 370 431(H)    ABG    Component Value Date/Time   PHART 7.295 (L) 07/19/2020 0016   PCO2ART 58.9 (H) 07/19/2020 0016   PO2ART 81 (L) 07/19/2020 0016   HCO3 28.9 (H) 07/19/2020 0016   TCO2 31 07/19/2020 0016   O2SAT 95.0 07/19/2020 0016    CBG (last 3)  Recent Labs    07/23/20 2308 07/24/20 0309 07/24/20 0706  GLUCAP 237* 257* 221*    Signature:  Sharion Settler PGY-2 Family Medicine

## 2020-07-25 ENCOUNTER — Inpatient Hospital Stay (HOSPITAL_COMMUNITY): Payer: 59

## 2020-07-25 DIAGNOSIS — I959 Hypotension, unspecified: Secondary | ICD-10-CM

## 2020-07-25 DIAGNOSIS — R6521 Severe sepsis with septic shock: Secondary | ICD-10-CM | POA: Diagnosis not present

## 2020-07-25 DIAGNOSIS — A419 Sepsis, unspecified organism: Secondary | ICD-10-CM | POA: Diagnosis not present

## 2020-07-25 LAB — RENAL FUNCTION PANEL
Albumin: 2.1 g/dL — ABNORMAL LOW (ref 3.5–5.0)
Albumin: 1.8 g/dL — ABNORMAL LOW (ref 3.5–5.0)
Albumin: 2 g/dL — ABNORMAL LOW (ref 3.5–5.0)
Anion gap: 5 (ref 5–15)
Anion gap: 5 (ref 5–15)
Anion gap: 3 — ABNORMAL LOW (ref 5–15)
BUN: 25 mg/dL — ABNORMAL HIGH (ref 8–23)
BUN: 23 mg/dL (ref 8–23)
BUN: 25 mg/dL — ABNORMAL HIGH (ref 8–23)
CO2: 27 mmol/L (ref 22–32)
CO2: 23 mmol/L (ref 22–32)
CO2: 28 mmol/L (ref 22–32)
Calcium: 8.2 mg/dL — ABNORMAL LOW (ref 8.9–10.3)
Calcium: 7.8 mg/dL — ABNORMAL LOW (ref 8.9–10.3)
Calcium: 8.1 mg/dL — ABNORMAL LOW (ref 8.9–10.3)
Chloride: 102 mmol/L (ref 98–111)
Chloride: 104 mmol/L (ref 98–111)
Chloride: 102 mmol/L (ref 98–111)
Creatinine, Ser: 0.31 mg/dL — ABNORMAL LOW (ref 0.61–1.24)
Creatinine, Ser: 0.31 mg/dL — ABNORMAL LOW (ref 0.61–1.24)
Creatinine, Ser: 0.31 mg/dL — ABNORMAL LOW (ref 0.61–1.24)
GFR, Estimated: >60 mL/min (ref >60)
GFR, Estimated: >60 mL/min (ref >60)
GFR, Estimated: >60 mL/min (ref >60)
Glucose, Bld: 187 mg/dL — ABNORMAL HIGH (ref 70–99)
Glucose, Bld: 127 mg/dL — ABNORMAL HIGH (ref 70–99)
Glucose, Bld: 202 mg/dL — ABNORMAL HIGH (ref 70–99)
Phosphorus: 2.8 mg/dL (ref 2.5–4.6)
Phosphorus: 2.8 mg/dL (ref 2.5–4.6)
Phosphorus: 2.7 mg/dL (ref 2.5–4.6)
Potassium: 4.6 mmol/L (ref 3.5–5.1)
Potassium: 6 mmol/L — ABNORMAL HIGH (ref 3.5–5.1)
Potassium: 4.4 mmol/L (ref 3.5–5.1)
Sodium: 134 mmol/L — ABNORMAL LOW (ref 135–145)
Sodium: 132 mmol/L — ABNORMAL LOW (ref 135–145)
Sodium: 133 mmol/L — ABNORMAL LOW (ref 135–145)

## 2020-07-25 LAB — URINALYSIS, ROUTINE W REFLEX MICROSCOPIC
Glucose, UA: 100 mg/dL — AB
Ketones, ur: NEGATIVE mg/dL
Nitrite: NEGATIVE
Protein, ur: >300 mg/dL — AB
Specific Gravity, Urine: 1.02 (ref 1.005–1.030)
pH: 6.5 (ref 5.0–8.0)

## 2020-07-25 LAB — CBC
HCT: 26.7 % — ABNORMAL LOW (ref 39.0–52.0)
Hemoglobin: 8.4 g/dL — ABNORMAL LOW (ref 13.0–17.0)
MCH: 24.1 pg — ABNORMAL LOW (ref 26.0–34.0)
MCHC: 31.5 g/dL (ref 30.0–36.0)
MCV: 76.7 fL — ABNORMAL LOW (ref 80.0–100.0)
Platelets: 500 10*3/uL — ABNORMAL HIGH (ref 150–400)
RBC: 3.48 MIL/uL — ABNORMAL LOW (ref 4.22–5.81)
RDW: 31.3 % — ABNORMAL HIGH (ref 11.5–15.5)
WBC: 36.8 10*3/uL — ABNORMAL HIGH (ref 4.0–10.5)
nRBC: 5.6 % — ABNORMAL HIGH (ref 0.0–0.2)

## 2020-07-25 LAB — URINALYSIS, MICROSCOPIC (REFLEX): Urine-Other: <10

## 2020-07-25 LAB — GLUCOSE, CAPILLARY
Glucose-Capillary: 177 mg/dL — ABNORMAL HIGH (ref 70–99)
Glucose-Capillary: 152 mg/dL — ABNORMAL HIGH (ref 70–99)
Glucose-Capillary: 121 mg/dL — ABNORMAL HIGH (ref 70–99)
Glucose-Capillary: 49 mg/dL — ABNORMAL LOW (ref 70–99)
Glucose-Capillary: 125 mg/dL — ABNORMAL HIGH (ref 70–99)
Glucose-Capillary: 203 mg/dL — ABNORMAL HIGH (ref 70–99)
Glucose-Capillary: 119 mg/dL — ABNORMAL HIGH (ref 70–99)

## 2020-07-25 LAB — PROTIME-INR
INR: 1.9 — ABNORMAL HIGH (ref 0.8–1.2)
Prothrombin Time: 21.4 seconds — ABNORMAL HIGH (ref 11.4–15.2)

## 2020-07-25 LAB — HEPARIN LEVEL (UNFRACTIONATED): Heparin Unfractionated: >1.1 IU/mL — ABNORMAL HIGH (ref 0.30–0.70)

## 2020-07-25 LAB — HEPATITIS PANEL, ACUTE
HCV Ab: NONREACTIVE
Hep A IgM: NONREACTIVE
Hep B C IgM: NONREACTIVE
Hepatitis B Surface Ag: NONREACTIVE

## 2020-07-25 LAB — MAGNESIUM: Magnesium: 2.6 mg/dL — ABNORMAL HIGH (ref 1.7–2.4)

## 2020-07-25 MED ORDER — JUVEN PO PACK: 1.0000 | PACK | Freq: Two times a day (BID) | ORAL | Status: DC

## 2020-07-25 MED ORDER — SODIUM CHLORIDE 0.9 % IV BOLUS
500.0000 mL | Freq: Once | INTRAVENOUS | Status: AC
  Administered 2020-07-25: 500 mL via INTRAVENOUS

## 2020-07-25 MED ORDER — SODIUM CHLORIDE 0.9 % IV SOLN
INTRAVENOUS | Status: DC | PRN
  Administered 2020-07-25: 500 mL via INTRAVENOUS
  Administered 2020-07-31 – 2020-08-28 (×3): 250 mL via INTRAVENOUS
  Administered 2020-09-01: 500 mL via INTRAVENOUS
  Administered 2020-10-18 – 2020-10-19 (×3): 250 mL via INTRAVENOUS

## 2020-07-25 MED ORDER — SERTRALINE HCL 50 MG PO TABS
100.0000 mg | ORAL_TABLET | Freq: Every day | ORAL | Status: DC
  Administered 2020-07-25 – 2020-10-20 (×87): 100 mg
  Filled 2020-07-25 (×89): qty 2

## 2020-07-25 MED ORDER — SODIUM CHLORIDE 0.9 % IV BOLUS: 500.0000 mL | Freq: Once | INTRAVENOUS | Status: AC

## 2020-07-25 NOTE — Progress Notes (Deleted)
[facility] Progress Note    Assessment/ Plan:   AKI -baseline Cr 0.4. AKI likely secondary to ATN in the context of shock. CRRT initiated on 7/6 for anasarca with ineffective diuresis and uremia. Appreciate assistance from CCM for rij temp line placement 7/6 -Not on an ideal long term candidate for HD due to poor functional status, vent dependency. Dr. Candiss Norse voiced this concern to both son and wife over the phone on 7/6 and they had wished to still proceed forward with renal replacement therapy. Appreciate palliative care's assistance.  I again discussed concern for long term dialysis to wife and son 7/12; they remain hopeful for renal recovery.  -Avoid nephrotoxic medications including NSAIDs and iodinated intravenous contrast exposure unless the latter is absolutely indicated.  Preferred narcotic agents for pain control are hydromorphone, fentanyl, and methadone. Morphine should not be used. Avoid Baclofen and avoid oral sodium phosphate and magnesium citrate based laxatives / bowel preps. Continue strict Input and Output monitoring. Will monitor the patient closely with you and intervene or adjust therapy as indicated by changes in clinical status/labs   Anasarca: improving - Cont UF with CRRT  Hyperkalemia: resolved cont current 4K/4K/2K dialysate flows.  CKD-MBD, hyperphosphatemia -managing with crrt, continue to monitor phos  Acute metabolic encephalopathy, likely related to sepsis and uremia -crrt as above, apparently alert and oriented at baseline.  Septic shock:   -pressor support and abx per ccm. On midodrine -ID following and recollecting cultures, imaging  Chronic respiratory failure s/p trach -vent mgmt per ccm  Quadriplegia 2/2 compressive myelopathy -resides at [facility] -appreciate palliative care's assistance  Microcytic anemia -transfuse for hgb <7. Avoid iron for now in light of active infection -hgb 8.4  Subjective:   Seen and examined on CRRT.  Remains on  pressors with hypothermia too -- reeval for infection underway per ID direction.  Fio2 30%.Remains anuric.  Net even yesterday with CRRT.    Objective:   BP (!) 111/58   Pulse 72   Temp 98.1 F (36.7 C) (Axillary)   Resp (!) 22   Ht 5' 6"  (1.676 m) Comment: Simultaneous filing. User may not have seen previous data.  Wt 79.5 kg   SpO2 97%   BMI 28.29 kg/m   Intake/Output Summary (Last 24 hours) at 07/25/2020 0815 Last data filed at 07/25/2020 0800 Gross per 24 hour  Intake 2963.04 ml  Output 2849 ml  Net 114.04 ml    Weight change: -3.2 kg  Physical Exam: [id number] ill appearing CVS:s1s2, rrr Resp:trach, bl chest expansion TSV:XBLT Ext:1+ edema Neuro: quadriplegic, opening eyes   Imaging: DG Chest Port 1 View  Result Date: 07/24/2020 CLINICAL DATA:  Respiratory failure.  Sepsis. EXAM: PORTABLE CHEST 1 VIEW COMPARISON:  Single-view of the chest 07/21/2020 and 07/18/2020. FINDINGS: Tracheostomy tube and right IJ catheter remain in place. Bilateral mid and lower lung zone airspace disease and effusions persist. Effusion on the right appears increased. No pneumothorax. Heart size is normal. IMPRESSION: Bilateral pleural effusions and mid and lower lung zone airspace disease. Left effusion appears somewhat increased. The appearance of the chest is otherwise unchanged. Electronically Signed   By: Inge Rise M.D.   On: 07/24/2020 14:57   DG Abd Portable 1V  Result Date: 07/24/2020 CLINICAL DATA:  Diarrhea in a patient with sepsis. EXAM: PORTABLE ABDOMEN - 1 VIEW COMPARISON:  Single-view of the abdomen 10/11/2019. CT abdomen and pelvis 07/17/2020. FINDINGS: The bowel gas pattern is normal. No radio-opaque calculi or other significant radiographic abnormality are  seen. PEG tube and right double-J ureteral stent again seen IMPRESSION: No acute finding. Peg tube and right double-J ureteral stent in place. Electronically Signed   By: Inge Rise M.D.   On:  07/24/2020 14:55    Labs: BMET Recent Labs  Lab 07/22/20 0302 07/22/20 1634 07/23/20 0247 07/23/20 1600 07/24/20 0306 07/24/20 1553 07/25/20 0319  NA 135 135 136 136 135 136 134*  K 4.7 5.1 5.2* 4.2 4.6 4.9 4.6  CL 101 103 103 103 102 104 102  CO2 28 23 25 29 29 28 27   GLUCOSE 296* 243* 206* 214* 286* 167* 187*  BUN 32* 29* 27* 26* 27* 24* 25*  CREATININE 0.34* 0.35* 0.33* 0.38* 0.33* 0.32* 0.31*  CALCIUM 8.2* 8.0* 8.3* 8.2* 8.3* 8.4* 8.2*  PHOS 2.5 2.7 2.5 2.6 2.6 2.7 2.8    CBC Recent Labs  Lab 07/23/20 0247 07/24/20 0306 07/24/20 1009 07/25/20 0319  WBC 36.3* 38.6* 40.6* 36.8*  NEUTROABS  --   --  36.3*  --   HGB 8.5* 7.8* 8.2* 8.4*  HCT 26.9* 24.9* 26.0* 26.7*  MCV 74.7* 75.7* 74.5* 76.7*  PLT 370 448* 488* 500*     Medications:     amiodarone  200 mg Per Tube Daily   apixaban  5 mg Per Tube BID   B-complex with vitamin C  1 tablet Per Tube Daily   buPROPion  75 mg Per Tube BID   chlorhexidine gluconate (MEDLINE KIT)  15 mL Mouth Rinse BID   Chlorhexidine Gluconate Cloth  6 each Topical Q1200   docusate  100 mg Per Tube BID   feeding supplement (PROSource TF)  90 mL Per Tube TID   ferrous sulfate  220 mg Per Tube Q breakfast   guaiFENesin  5 mL Per Tube BID   hydrocortisone sod succinate (SOLU-CORTEF) inj  50 mg Intravenous Q12H   insulin aspart  0-20 Units Subcutaneous Q4H   insulin aspart  4 Units Subcutaneous Q4H   insulin glargine  20 Units Subcutaneous BID   mouth rinse  15 mL Mouth Rinse 10 times per day   midodrine  15 mg Per Tube Q8H   pantoprazole sodium  40 mg Per Tube QHS   polyethylene glycol  17 g Per Tube Daily   sodium chloride flush  10-40 mL Intracatheter Q12H     Jannifer Hick MD [facility] Pager [phone]

## 2020-07-25 NOTE — Progress Notes (Signed)
[facility] Infectious Diseases Follow Up Note  Patient Identification: Patient Name: Luis Orr MRN: [id number] [place] Date: 07/08/2020  6:13 PM Age: 70 y.o.Today's Date: 07/25/2020   Reason for Visit: shock  Active Problems:   Septic shock ([place])   Pressure injury of skin   Acute respiratory failure with hypoxia (HCC)   AKI (acute kidney injury) ([place])   Antibiotics: Vancomycin 6/29-6/30                    Meropenem 6/29-7/2                    Unasyn 7/2-current   Lines/Tubes: RT IJ HD catheter , LLQ colostomy, Urethral catheter , tracheostomy, PIVs    Assessment 25 Y O male from [facility] with hx  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 Afebrile on presentation but had leukocytosis of 23, requiring pressors, possibly in the setting of HAP. Resp cx 6/30 growing Amp S Acinetobacter baumannii   Shock: Septic vs distributive -Temperature is improving with bear hugger, last episode of hypothermia was 7/11 -Leukocytosis is also improving 40>36 He is intermittently low dose levophed, Overnight required up to Levophed of 9 which by this morning was at 4. Per RN , levophed has to be titrated up when patient goes to sleep.  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  Would recommend to get CT chest to look for worsening PNA/increased pleural effusion to see if it can be tapped  Rt Upper quadrant Ultrasound for transaminitis Fu blood cultures ( concerns for CLABS), UA was unremarkable. Low concerns for C diff at this time.   Rest of the management as per the primary team. Thank you for the consult. Please page with pertinent questions or concerns. _____________________________________________________________________ Subjective patient seen and examined at the bedside. Spoke with RN at bedside, on levophed 4, Fi02 30%  Vitals BP (!) 111/58   Pulse 77   Temp 98.1 F (36.7 C) (Axillary)   Resp (!) 21   Ht '5\' 6"'$  (1.676 m) Comment: Simultaneous filing. User may not have seen previous data.  Wt 79.5 kg   SpO2 96%   BMI 28.29 kg/m     Physical Exam Constitutional:  lying in bed, appears sick    Comments:   Cardiovascular:     Rate and Rhythm: Normal rate and regular rhythm.     Heart sounds:   Pulmonary:     Effort: Trach to vent     Comments:   Abdominal:     Palpations: Abdomen is soft.     Tenderness: LLQ colostomy with liquid stool   Musculoskeletal:        General: No swelling or tenderness.   Skin:    Comments: No obvious lesions or rashes  Neurological:     General: unable to assess    Psychiatric:        Mood and Affect: unable to assess   Pertinent Microbiology Results for orders placed or performed during the hospital encounter of 06/17/2020  Urine culture     Status: Abnormal   Collection Time: 06/27/2020  6:24 PM   Specimen: Urine, Random  Result Value Ref Range Status   Specimen Description URINE, RANDOM  Final   Special Requests   Final    NONE Performed at [facility], [address] [place], [place] [address]    Culture MULTIPLE SPECIES PRESENT, SUGGEST RECOLLECTION (A)  Final   Report Status 07/13/2020 FINAL  Final   Culture, blood (routine x 2)     Status: Abnormal   Collection Time: 06/29/2020  6:42 PM   Specimen: BLOOD LEFT FOREARM  Result Value Ref Range Status   Specimen Description BLOOD LEFT FOREARM  Final   Special Requests   Final    BOTTLES DRAWN AEROBIC AND ANAEROBIC Blood Culture results may not be optimal due to an inadequate volume of blood received in culture bottles   Culture  Setup Time   Final    ANAEROBIC BOTTLE ONLY GRAM POSITIVE COCCI CRITICAL RESULT CALLED TO, READ BACK BY AND VERIFIED WITH: G ABBOTT PHARMD 07/13/20 0207 JDW    Culture (A)  Final    STAPHYLOCOCCUS AURICULARIS THE SIGNIFICANCE OF ISOLATING THIS ORGANISM FROM A SINGLE SET OF BLOOD CULTURES WHEN MULTIPLE SETS ARE DRAWN IS UNCERTAIN. PLEASE NOTIFY THE MICROBIOLOGY DEPARTMENT WITHIN ONE WEEK IF SPECIATION AND SENSITIVITIES ARE REQUIRED. Performed at [facility], [place] [address] [place], [place] [address]    Report Status 07/14/2020 FINAL  Final  Blood Culture ID Panel (Reflexed)     Status: Abnormal   Collection Time: 06/24/2020  6:42 PM  Result Value Ref Range Status   Enterococcus faecalis NOT DETECTED NOT DETECTED Final   Enterococcus Faecium NOT DETECTED NOT DETECTED Final   Listeria monocytogenes NOT DETECTED NOT DETECTED Final   Staphylococcus species DETECTED (A) NOT DETECTED Final    Comment: CRITICAL RESULT CALLED TO, READ BACK BY AND VERIFIED WITH: G ABBOTT PHARMD 07/13/20 0207 JDW    Staphylococcus aureus (BCID) NOT DETECTED NOT DETECTED Final   Staphylococcus epidermidis NOT DETECTED NOT DETECTED Final   Staphylococcus lugdunensis NOT DETECTED NOT DETECTED Final   Streptococcus species NOT DETECTED NOT DETECTED Final   Streptococcus agalactiae NOT DETECTED NOT DETECTED Final   Streptococcus pneumoniae NOT DETECTED NOT DETECTED Final   Streptococcus pyogenes NOT DETECTED NOT DETECTED Final   A.calcoaceticus-baumannii NOT DETECTED NOT DETECTED Final   Bacteroides fragilis NOT DETECTED NOT DETECTED  Final   Enterobacterales NOT DETECTED NOT DETECTED Final   Enterobacter cloacae complex NOT DETECTED NOT DETECTED Final   Escherichia coli NOT DETECTED NOT DETECTED Final   Klebsiella aerogenes NOT DETECTED NOT DETECTED Final   Klebsiella oxytoca NOT DETECTED NOT DETECTED Final   Klebsiella pneumoniae NOT DETECTED NOT DETECTED Final   Proteus species NOT DETECTED NOT DETECTED Final   Salmonella species NOT DETECTED NOT DETECTED Final   Serratia marcescens NOT DETECTED NOT DETECTED Final   Haemophilus influenzae NOT DETECTED NOT DETECTED Final   Neisseria meningitidis NOT DETECTED NOT DETECTED Final   Pseudomonas aeruginosa NOT DETECTED NOT DETECTED Final   Stenotrophomonas maltophilia NOT DETECTED NOT DETECTED Final   Candida albicans NOT DETECTED NOT DETECTED Final   Candida auris NOT DETECTED NOT DETECTED Final  Candida glabrata NOT DETECTED NOT DETECTED Final   Candida krusei NOT DETECTED NOT DETECTED Final   Candida parapsilosis NOT DETECTED NOT DETECTED Final   Candida tropicalis NOT DETECTED NOT DETECTED Final   Cryptococcus neoformans/gattii NOT DETECTED NOT DETECTED Final    Comment: Performed at [facility], [place] [address] [place], [place] [address]  Resp Panel by RT-PCR (Flu A&B, Covid) Nasopharyngeal Swab     Status: None   Collection Time: 07/02/2020  7:22 PM   Specimen: Nasopharyngeal Swab; Nasopharyngeal(NP) swabs in vial transport medium  Result Value Ref Range Status   SARS Coronavirus 2 by RT PCR NEGATIVE NEGATIVE Final    Comment: (NOTE) SARS-CoV-2 target nucleic acids are NOT DETECTED.  The SARS-CoV-2 RNA is generally detectable in upper respiratory specimens during the acute phase of infection. The lowest concentration of SARS-CoV-2 viral copies this assay can detect is 138 copies/mL. A negative result does not preclude SARS-Cov-2 infection and should not be used as the sole basis for treatment or other patient management decisions. A negative result may  occur with  improper specimen collection/handling, submission of specimen other than nasopharyngeal swab, presence of viral mutation(s) within the areas targeted by this assay, and inadequate number of viral copies(<138 copies/mL). A negative result must be combined with clinical observations, patient history, and epidemiological information. The expected result is Negative.  Fact Sheet for Patients:  EntrepreneurPulse.com.au  Fact Sheet for Healthcare Providers:  IncredibleEmployment.be  This test is no t yet approved or cleared by the [place] FDA and  has been authorized for detection and/or diagnosis of SARS-CoV-2 by FDA under an Emergency Use Authorization (EUA). This EUA will remain  in effect (meaning this test can be used) for the duration of the COVID-19 declaration under Section 564(b)(1) of the Act, 21 U.S.C.section 360bbb-3(b)(1), unless the authorization is terminated  or revoked sooner.       Influenza A by PCR NEGATIVE NEGATIVE Final   Influenza B by PCR NEGATIVE NEGATIVE Final    Comment: (NOTE) The Xpert Xpress SARS-CoV-2/FLU/RSV plus assay is intended as an aid in the diagnosis of influenza from Nasopharyngeal swab specimens and should not be used as a sole basis for treatment. Nasal washings and aspirates are unacceptable for Xpert Xpress SARS-CoV-2/FLU/RSV testing.  Fact Sheet for Patients: EntrepreneurPulse.com.au  Fact Sheet for Healthcare Providers: IncredibleEmployment.be  This test is not yet approved or cleared by the [place] FDA and has been authorized for detection and/or diagnosis of SARS-CoV-2 by FDA under an Emergency Use Authorization (EUA). This EUA will remain in effect (meaning this test can be used) for the duration of the COVID-19 declaration under Section 564(b)(1) of the Act, 21 U.S.C. section 360bbb-3(b)(1), unless the authorization is terminated  or revoked.  Performed at [facility], [place] [address], [place], [place] [address]   Culture, blood (routine x 2)     Status: None   Collection Time: 07/06/2020  7:23 PM   Specimen: BLOOD  Result Value Ref Range Status   Specimen Description BLOOD RIGHT ANTECUBITAL  Final   Special Requests   Final    BOTTLES DRAWN AEROBIC AND ANAEROBIC Blood Culture adequate volume   Culture   Final    NO GROWTH 5 DAYS Performed at [facility], [place] [address], [place], [place] [address]    Report Status 07/16/2020 FINAL  Final  MRSA Next Gen by PCR, Nasal     Status: Abnormal   Collection Time: 06/15/2020 10:29 PM  Specimen: Nasal Mucosa; Nasal Swab  Result Value Ref Range Status   MRSA by PCR Next Gen DETECTED (A) NOT DETECTED Final    Comment: RESULT CALLED TO, READ BACK BY AND VERIFIED WITH: Ayesha Rumpf RN 07/12/20 0428 JDW (NOTE) The GeneXpert MRSA Assay (FDA approved for NASAL specimens only), is one component of a comprehensive MRSA colonization surveillance program. It is not intended to diagnose MRSA infection nor to guide or monitor treatment for MRSA infections. Test performance is not FDA approved in patients less than 60 years old. Performed at [facility], [place] [address] [place], [place] [address]   Culture, Respiratory w Gram Stain     Status: None   Collection Time: 07/12/20  3:48 PM   Specimen: Tracheal Aspirate; Respiratory  Result Value Ref Range Status   Specimen Description TRACHEAL ASPIRATE  Final   Special Requests NONE  Final   Gram Stain   Final    MODERATE WBC PRESENT, PREDOMINANTLY PMN FEW GRAM NEGATIVE RODS FEW GRAM POSITIVE COCCI IN PAIRS    Culture   Final    MODERATE ACINETOBACTER BAUMANNII MULTI-DRUG RESISTANT ORGANISM RESULT CALLED TO, READ BACK BY AND VERIFIED WITH: RN J.BEABRAUT ON [id number] AT 1410 BY E.PARRISH Performed at [facility], [place] [address] [place], [place] [address]    Report Status 07/14/2020 FINAL  Final    Organism ID, Bacteria ACINETOBACTER BAUMANNII  Final      Susceptibility   Acinetobacter baumannii - MIC*    CEFTAZIDIME 32 RESISTANT Resistant     CIPROFLOXACIN >=4 RESISTANT Resistant     GENTAMICIN >=16 RESISTANT Resistant     IMIPENEM >=16 RESISTANT Resistant     PIP/TAZO >=128 RESISTANT Resistant     TRIMETH/SULFA >=320 RESISTANT Resistant     AMPICILLIN/SULBACTAM 8 SENSITIVE Sensitive     * MODERATE ACINETOBACTER BAUMANNII  Culture, Urine     Status: Abnormal   Collection Time: 07/13/20 11:26 AM   Specimen: Urine, Random  Result Value Ref Range Status   Specimen Description URINE, RANDOM  Final   Special Requests   Final    NONE Performed at [facility], [place] [address], [place], [place] [address]    Culture MULTIPLE SPECIES PRESENT, SUGGEST RECOLLECTION (A)  Final   Report Status 07/14/2020 FINAL  Final      Pertinent Lab. CBC Latest Ref Rng & Units 07/25/2020 07/24/2020 07/24/2020  WBC 4.0 - 10.5 K/uL 36.8(H) 40.6(H) 38.6(H)  Hemoglobin 13.0 - 17.0 g/dL 8.4(L) 8.2(L) 7.8(L)  Hematocrit 39.0 - 52.0 % 26.7(L) 26.0(L) 24.9(L)  Platelets 150 - 400 K/uL 500(H) 488(H) 448(H)   CMP Latest Ref Rng & Units 07/25/2020 07/24/2020 07/24/2020  Glucose 70 - 99 mg/dL 187(H) 167(H) 286(H)  BUN 8 - 23 mg/dL 25(H) 24(H) 27(H)  Creatinine 0.61 - 1.24 mg/dL 0.31(L) 0.32(L) 0.33(L)  Sodium 135 - 145 mmol/L 134(L) 136 135  Potassium 3.5 - 5.1 mmol/L 4.6 4.9 4.6  Chloride 98 - 111 mmol/L 102 104 102  CO2 22 - 32 mmol/L '27 28 29  '$ Calcium 8.9 - 10.3 mg/dL 8.2(L) 8.4(L) 8.3(L)  Total Protein 6.5 - 8.1 g/dL - 8.4(H) -  Total Bilirubin 0.3 - 1.2 mg/dL - 0.9 -  Alkaline Phos 38 - 126 U/L - 389(H) -  AST 15 - 41 U/L - 214(H) -  ALT 0 - 44 U/L - 105(H) -    Pertinent Imaging today Plain films and CT images have been personally visualized and interpreted; radiology reports  have been reviewed. Decision making incorporated into the Impression / Recommendations.  CT abdomen/pelvis  07/17/20 IMPRESSION: 1. Gastrostomy tube noted without evidence for fluid collection around the gastrostomy tube. 2. Marked atrophy of the right kidney with internal ureteral stent in situ. Proximal loop is formed in the posterior aspect of the upper pole, presumably in an upper pole calyx although this cannot be confirmed on today's study. 3. Stone along the mid right ureter could be in the ureter or adjacent gonadal vein. 4. Patchy and consolidative airspace disease in both lower lungs suggesting multifocal pneumonia. Small bilateral pleural effusions. 5. Small volume ascites. 6. Diffuse body wall edema. 7. Aortic Atherosclerosis (ICD10-I70.0).  TTE   1. Very technically difficulat study. In one view (contrast A4C) the  right ventricle appears moderately enlarged with reduced function. Left  ventricular ejection fraction, by estimation, is 50 to 55%. The left  ventricle has low normal function. Left  ventricular endocardial border not optimally defined to evaluate regional  wall motion, even with contrast administration. There is mild concentric  left ventricular hypertrophy. Left ventricular diastolic parameters are  indeterminate.   2. The mitral valve was not assessed. No evidence of mitral valve  regurgitation.   3. The aortic valve was not assessed. Aortic valve regurgitation is not  visualized.   4. Aortic dilatation noted. There is mild dilatation of the aortic root,  measuring 40 mm.   I spent more than 35 minutes for this patient encounter including review of prior medical records, coordination of care  with greater than 50% of time being face to face/counseling and discussing diagnostics/treatment plan with the patient/family.  Electronically signed by:   Rosiland Oz, MD Infectious Disease Physician [facility] Pager: [phone]

## 2020-07-25 NOTE — Progress Notes (Signed)
[facility] Progress Note    Assessment/ Plan:   AKI -baseline Cr 0.4. AKI likely secondary to ATN in the context of shock. CRRT initiated on 7/6 for anasarca with ineffective diuresis and uremia. Appreciate assistance from CCM for rij temp line placement 7/6 -Not on an ideal long term candidate for HD due to poor functional status, vent dependency. Dr. Candiss Norse voiced this concern to both son and wife over the phone on 7/6 and they had wished to still proceed forward with renal replacement therapy. Appreciate palliative care's assistance.  I again discussed concern for long term dialysis to wife and son 7/12; they remain hopeful for renal recovery.  -Avoid nephrotoxic medications including NSAIDs and iodinated intravenous contrast exposure unless the latter is absolutely indicated.  Preferred narcotic agents for pain control are hydromorphone, fentanyl, and methadone. Morphine should not be used. Avoid Baclofen and avoid oral sodium phosphate and magnesium citrate based laxatives / bowel preps. Continue strict Input and Output monitoring. Will monitor the patient closely with you and intervene or adjust therapy as indicated by changes in clinical status/labs   Anasarca: improved, volume is mostly extravascular and trying to prevent ongoing AKI so run net even today  CKD-MBD, hyperphosphatemia -managing with crrt, continue to monitor phos  Acute metabolic encephalopathy, likely related to sepsis and uremia -crrt as above, apparently alert and oriented at baseline.  Septic shock:   -pressor support and abx per ccm. On midodrine -ID following and recollecting cultures, imaging  Chronic respiratory failure s/p trach -vent mgmt per ccm  Quadriplegia 2/2 compressive myelopathy -resides at [facility] -appreciate palliative care's assistance  Microcytic anemia -transfuse for hgb <7. Avoid iron for now in light of active infection -hgb 8.4  Subjective:   Seen and examined on  CRRT. Remains on  pressors with hypothermia too -- reeval for infection underway per ID direction.  Fio2 30%.Remains anuric.  Net even yesterday with CRRT.    Objective:   BP (!) 105/54   Pulse 69   Temp (!) 97.5 F (36.4 C) (Axillary)   Resp (!) 22   Ht 5' 6"  (1.676 m) Comment: Simultaneous filing. User may not have seen previous data.  Wt 79.5 kg   SpO2 95%   BMI 28.29 kg/m   Intake/Output Summary (Last 24 hours) at 07/25/2020 1124 Last data filed at 07/25/2020 1100 Gross per 24 hour  Intake 3390.48 ml  Output 2665 ml  Net 725.48 ml    Weight change: -3.2 kg  Physical Exam: [id number] ill appearing CVS:s1s2, rrr Resp:trach, bl chest expansion QPR:FFMB Ext:1+ diffuse edema Neuro: quadriplegic, opening eyes   Imaging: DG Chest Port 1 View  Result Date: 07/24/2020 CLINICAL DATA:  Respiratory failure.  Sepsis. EXAM: PORTABLE CHEST 1 VIEW COMPARISON:  Single-view of the chest 07/21/2020 and 07/18/2020. FINDINGS: Tracheostomy tube and right IJ catheter remain in place. Bilateral mid and lower lung zone airspace disease and effusions persist. Effusion on the right appears increased. No pneumothorax. Heart size is normal. IMPRESSION: Bilateral pleural effusions and mid and lower lung zone airspace disease. Left effusion appears somewhat increased. The appearance of the chest is otherwise unchanged. Electronically Signed   By: Inge Rise M.D.   On: 07/24/2020 14:57   DG Abd Portable 1V  Result Date: 07/24/2020 CLINICAL DATA:  Diarrhea in a patient with sepsis. EXAM: PORTABLE ABDOMEN - 1 VIEW COMPARISON:  Single-view of the abdomen 10/11/2019. CT abdomen and pelvis 07/17/2020. FINDINGS: The bowel gas pattern is normal. No radio-opaque calculi or other  significant radiographic abnormality are seen. PEG tube and right double-J ureteral stent again seen IMPRESSION: No acute finding. Peg tube and right double-J ureteral stent in place. Electronically Signed   By: Inge Rise  M.D.   On: 07/24/2020 14:55    Labs: BMET Recent Labs  Lab 07/22/20 0302 07/22/20 1634 07/23/20 0247 07/23/20 1600 07/24/20 0306 07/24/20 1553 07/25/20 0319  NA 135 135 136 136 135 136 134*  K 4.7 5.1 5.2* 4.2 4.6 4.9 4.6  CL 101 103 103 103 102 104 102  CO2 28 23 25 29 29 28 27   GLUCOSE 296* 243* 206* 214* 286* 167* 187*  BUN 32* 29* 27* 26* 27* 24* 25*  CREATININE 0.34* 0.35* 0.33* 0.38* 0.33* 0.32* 0.31*  CALCIUM 8.2* 8.0* 8.3* 8.2* 8.3* 8.4* 8.2*  PHOS 2.5 2.7 2.5 2.6 2.6 2.7 2.8    CBC Recent Labs  Lab 07/23/20 0247 07/24/20 0306 07/24/20 1009 07/25/20 0319  WBC 36.3* 38.6* 40.6* 36.8*  NEUTROABS  --   --  36.3*  --   HGB 8.5* 7.8* 8.2* 8.4*  HCT 26.9* 24.9* 26.0* 26.7*  MCV 74.7* 75.7* 74.5* 76.7*  PLT 370 448* 488* 500*     Medications:     amiodarone  200 mg Per Tube Daily   apixaban  5 mg Per Tube BID   B-complex with vitamin C  1 tablet Per Tube Daily   buPROPion  75 mg Per Tube BID   chlorhexidine gluconate (MEDLINE KIT)  15 mL Mouth Rinse BID   Chlorhexidine Gluconate Cloth  6 each Topical Q1200   docusate  100 mg Per Tube BID   feeding supplement (PROSource TF)  90 mL Per Tube TID   guaiFENesin  5 mL Per Tube BID   hydrocortisone sod succinate (SOLU-CORTEF) inj  50 mg Intravenous Q12H   insulin aspart  0-20 Units Subcutaneous Q4H   insulin aspart  4 Units Subcutaneous Q4H   insulin glargine  20 Units Subcutaneous BID   mouth rinse  15 mL Mouth Rinse 10 times per day   midodrine  15 mg Per Tube Q8H   pantoprazole sodium  40 mg Per Tube QHS   polyethylene glycol  17 g Per Tube Daily   sertraline  100 mg Per Tube Daily   sodium chloride flush  10-40 mL Intracatheter Q12H     Jannifer Hick MD [facility] Pager [phone]

## 2020-07-25 NOTE — Progress Notes (Addendum)
Tube feeds on hold at 1043 for RUQ abdominal ultrasound.  Dewaine Oats, RN

## 2020-07-25 NOTE — Progress Notes (Addendum)
Nutrition Follow-up  DOCUMENTATION CODES:   Not applicable  INTERVENTION:   Continue TF via PEG: Osmolite 1.5 at 50 ml/h (1200 ml per day). Prosource TF 90 ml TID.  Provides 2040 kcal, 141 gm protein, 914 ml free water daily.  Continue B-complex with vitamin C daily via tube.  NUTRITION DIAGNOSIS:   Increased nutrient needs related to wound healing as evidenced by estimated needs.  Ongoing   GOAL:   Patient will meet greater than or equal to 90% of their needs  Met with TF  MONITOR:   Vent status, Labs, Weight trends, TF tolerance, Skin, I & O's  REASON FOR ASSESSMENT:   Ventilator, Consult Enteral/tube feeding initiation and management  ASSESSMENT:   70 year old male who presented to the ED on 6/29 from [facility] with AMS and hypotension. 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 Pt admitted with septic shock, acute on chronic hypoxemic respiratory failure.  Discussed patient in ICU rounds and with RN today. Remains on CRRT and vasopressors. TF on hold for ultrasound of RUQ later today. Chest CT pending.  Receiving Osmolite 1.5 at 50 ml/h with Prosource TF 45 ml QID via PEG to provide 1960 kcal, 119 gm protein, 914 ml free water daily.   Patient is currently intubated on ventilator support MV: 12.1 L/min Temp (24hrs), Avg:97.5 F (36.4 C), Min:96.5 F (35.8 C), Max:98.1 F (36.7 C)   Weight trending back down with volume removal from CRRT. Admission weight 90.2 kg Current weight 79.5 kg  I/O -5.6 L since admission UOP 5 ml x 24 hours Colostomy output 125 ml x 24 hours  Labs reviewed. Na 134, mag 2.6 CBG: [phone]  Medications reviewed and include B-complex with C, colace, solu-cortef, novolog, Lantus, Protonix, Miralax, Levophed.   Diet Order:   Diet Order              Diet NPO time specified  Diet effective now                   EDUCATION NEEDS:   No education needs have been identified at this time  Skin:  Skin Assessment: Skin Integrity Issues: Skin Integrity Issues:: DTI, Stage IV DTI: sacrum Stage IV: sacrum  Last BM:  7/13 colostomy  Height:   Ht Readings from Last 1 Encounters:  07/03/2020 _0  (1.676 m)    Weight:   Wt Readings from Last 1 Encounters:  07/25/20 79.5 kg    Ideal Body Weight:  58 kg (adjusted for quadriplegia)  BMI:  Body mass index is 28.29 kg/m.  Estimated Nutritional Needs:   Kcal:  1900-2100  Protein:  125-145 gm  Fluid:  >/= 2.0 L    Lucas Mallow, RD, LDN, CNSC Please refer to Amion for contact information.

## 2020-07-25 NOTE — Progress Notes (Signed)
NAME:  DAINEL ARCIDIACONO, MRN:  [id number], DOB:  [birth year], LOS: 74 ADMISSION DATE:  07/07/2020, CONSULTATION DATE:  07/09/2020 REFERRING MD:  Dr Francia Greaves, EDP CHIEF COMPLAINT:  septic shock    History of Present Illness:  70 yo male from [facility] with hx of C3-C5 compressive myelopathy with functional quadriplegia and chronic trach/vent presented to ED with altered mental status and hypotension with recurrent HCAP.  Has multiple recent admissions for sepsis secondary to various infections including UTIs, pneumonias and bacteremia in setting of MDR Klebsiella, MRSA and pseudomonas. Most recent admission September 2021 in setting of sepsis due to MDR Klebsiella with right ureteral calculus s/p double J ureteral stent placement and treated with meropenem x 10days.  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 Central line placed. Drainage from gastrostomy tube >> CT Abd without fluid collection around gastrostomy tube but with diffuse body wall edema. Echo EF 50-55%, mild LVH  7/6 Renal Replacement Therapy ; Code status changed to partial Code (DNR) 7/7 Transfuse 1U PRBC 7/9 add solu cortef 7/10 off pressors, weaning stress steroids  7/11 Pressors added back on again 7/12 ID consult due to rising WBC, hypothermia, hypotension  concern for worsening infection. Vancomycin added. Repeat blood cx collected.  Interim History / Subjective:  Awake, ventilated. Non-verbal.  Objective   Blood pressure (!) 111/58, pulse 77, temperature 98.1 F (36.7 C), temperature source Axillary, resp. rate (!) 21, height 5' 6"  (1.676 m), weight 79.5 kg, SpO2 96 %.    Vent Mode: PRVC FiO2 (%):  [30 %] 30 % Set Rate:  [22 bmp] 22 bmp Vt Set:  [380 mL] 380 mL PEEP:  [5 cmH20] 5 cmH20 Plateau Pressure:  [23 cmH20-28 cmH20] 26 cmH20   Intake/Output Summary (Last 24 hours) at 07/25/2020 0736 Last data filed at 07/25/2020 0700 Gross per 24 hour  Intake 2911.16 ml  Output 2960 ml  Net -48.84 ml   Filed Weights   07/23/20 0500 07/24/20 0500 07/25/20 0500  Weight: 83.9 kg 82.7 kg 79.5 kg   Examination:  General - awake, alert, non-verbal, chronically ill appearing, bair-hugger on Eyes - sclera anicteric, EOMI ENT - trach site clean and midline, HD cath site C/D/I Cardiac - regular rate/rhythm, no murmur Chest - scattered rhonchi upper lobes b/l Abdomen - mildly distended but soft, colostomy in LLQ with liquid stool, peg-tube in place without drainage Extremities - 1+ edema b/l lower extremities Skin - warm and dry Neuro - Awake, alert, non-verbal, quadriplegic  GU - penile/scrotal edema  Labs Na 134, K 4.6, Cr 0.31, Mg 2.6 AST 214, ALT 105, Alk phos 389, direct bili 0.5 WBC 36.8, Hgb 8.4, Plts 500 UA with large hgb (21-50 RBC), small bilirubin, >300 protein, moderate leukocytes, few bacteria CXR: Bilateral pleural effusions, left appears increased. Otherwise unchanged KUB: No acute finding  Resolved Hospital Problem list  Hyperkalemia Tachycardia Hypotension Thrombocytosis  AKI  Assessment & Plan:  Septic shock 2nd to HCAP and UTI all present on admission. On/off Levophed, maintaining MAP >65.  Question if related to volume status given significant volume taken off with CRRT. WBC peak at 40.6 yesterday. ID consulted  and Vanc added for MRSA coverage. Slight improvement in WBC today. Afebrile. CXR with slightly increased left effusion, otherwise unchanged. KUB without significant findings. Repeat UA yesterday consistent with UTI, urine culture not collected. -Acinetobacter in sputum culture from 07/12/20 -Continue abx. Day 12 of Unasyn (increased to high-dose). Day 1 Vancomycin -Continue midodrine 15 mg q8h -500 cc NS bolus x1 -Continue steroids, can consider weaning once stable off pressors -F/u repeat blood cultures -F/u pathology smear  Acute on chronic hypoxic respiratory failure. Tracheostomy and ventilator dependent status. - full vent support - goal SpO2 > 92% - bronchial hygiene  Transaminitis, likely 2/2 shock liver AST 214, ALT 105, Alk Phos 389.  Differential includes viral hepatitis, drug-induced, hepatic ischemia from shock, adrenal insufficiency. Previous CT on 7/5 showed no focal liver abnormality but noted possible sludge or stone in gallbladder without intra or extra-hepatic biliary dilation.  -Monitor   Hypothermia On bair-hugger, temperature stable today but previously has been hypothermic. On broad-coverage abx.  -Continue steroids -Continue bair-hugger -Monitor vitals closely -Treat sepsis as above  DM II poorly controlled with hyperglycemia. CBG improved with only one reading >200 in last 24 hours.  -Continue Lantus 20U BID -Continue 4U tube feed coverage q4h -Continue resistant SSI  Anasarca Net negative 6.2L since admission. Weight down 7 lbs since yeterday and 23 lbs since admission. Dry weight is unknown. -CRRT per nephrology; hopefully transition to iHD soon if stable off pressors -Dispo back to [facility] when able to tolerate iHD   Anemia of critical illness, chronic disease and iron deficiency. Hgb stable. - s/p 1U PRBC transfusion on 7/02 and 7/07 - holding iron supplementation in setting of infection - Continue Vitamin C supplementation  - transfuse for Hb <  7  Severe protein calorie malnutrition (POA) - continue tube feeds  Paroxysmal atrial fibrillation. -Continue amiodarone -Apixiban per pharmacy  Sacral decubitus ulcer stage IV (POA) - wound care  Depression -On Wellbutrin -Resume home Sertraline  Goals of Care - continue medical therapy - DNR in event of cardiac arrest  Best Practice (right click and "Reselect all SmartList Selections" daily)   Diet/type: tubefeeds DVT prophylaxis: systemic heparin, coumadin GI prophylaxis: PPI Lines: Dialysis Catheter and yes and it is still needed Foley:  Yes, and it is still needed Code Status:  limited Disposition: likely back to [facility] once he is off CRRT and tolerating HD  Labs:   CMP Latest Ref Rng & Units 07/25/2020 07/24/2020 07/24/2020  Glucose 70 - 99 mg/dL 187(H) 167(H) 286(H)  BUN 8 - 23 mg/dL 25(H) 24(H) 27(H)  Creatinine 0.61 - 1.24 mg/dL 0.31(L) 0.32(L) 0.33(L)  Sodium 135 - 145 mmol/L 134(L) 136 135  Potassium 3.5 - 5.1 mmol/L 4.6 4.9 4.6  Chloride 98 - 111 mmol/L 102 104 102  CO2 22 - 32 mmol/L 27 28 29   Calcium 8.9 - 10.3 mg/dL 8.2(L) 8.4(L) 8.3(L)  Total Protein 6.5 - 8.1 g/dL - 8.4(H) -  Total Bilirubin 0.3 - 1.2 mg/dL - 0.9 -  Alkaline Phos 38 - 126 U/L - 389(H) -  AST 15 - 41 U/L - 214(H) -  ALT 0 - 44 U/L - 105(H) -    CBC Latest Ref Rng & Units 07/25/2020 07/24/2020  07/24/2020  WBC 4.0 - 10.5 K/uL 36.8(H) 40.6(H) 38.6(H)  Hemoglobin 13.0 - 17.0 g/dL 8.4(L) 8.2(L) 7.8(L)  Hematocrit 39.0 - 52.0 % 26.7(L) 26.0(L) 24.9(L)  Platelets 150 - 400 K/uL 500(H) 488(H) 448(H)    ABG    Component Value Date/Time   PHART 7.295 (L) 07/19/2020 0016   PCO2ART 58.9 (H) 07/19/2020 0016   PO2ART 81 (L) 07/19/2020 0016   HCO3 28.9 (H) 07/19/2020 0016   TCO2 31 07/19/2020 0016   O2SAT 95.0 07/19/2020 0016    CBG (last 3)  Recent Labs    07/24/20 2323 07/25/20 0306 07/25/20 0715  GLUCAP 161* 177* 152*    Signature:  Sharion Settler PGY-2 Family Medicine

## 2020-07-26 ENCOUNTER — Inpatient Hospital Stay (HOSPITAL_COMMUNITY): Payer: 59

## 2020-07-26 DIAGNOSIS — I959 Hypotension, unspecified: Secondary | ICD-10-CM

## 2020-07-26 DIAGNOSIS — A419 Sepsis, unspecified organism: Secondary | ICD-10-CM | POA: Diagnosis not present

## 2020-07-26 DIAGNOSIS — J9601 Acute respiratory failure with hypoxia: Secondary | ICD-10-CM

## 2020-07-26 DIAGNOSIS — N179 Acute kidney failure, unspecified: Secondary | ICD-10-CM | POA: Diagnosis not present

## 2020-07-26 DIAGNOSIS — R6521 Severe sepsis with septic shock: Secondary | ICD-10-CM | POA: Diagnosis not present

## 2020-07-26 LAB — PROCALCITONIN: Procalcitonin: 0.69 ng/mL

## 2020-07-26 LAB — HEPATIC FUNCTION PANEL
ALT: 142 U/L — ABNORMAL HIGH (ref 0–44)
AST: 341 U/L — ABNORMAL HIGH (ref 15–41)
Albumin: 2 g/dL — ABNORMAL LOW (ref 3.5–5.0)
Alkaline Phosphatase: 451 U/L — ABNORMAL HIGH (ref 38–126)
Bilirubin, Direct: 0.5 mg/dL — ABNORMAL HIGH (ref 0.0–0.2)
Indirect Bilirubin: 0.5 mg/dL (ref 0.3–0.9)
Total Bilirubin: 1 mg/dL (ref 0.3–1.2)
Total Protein: 7.6 g/dL (ref 6.5–8.1)

## 2020-07-26 LAB — RENAL FUNCTION PANEL
Albumin: 1.8 g/dL — ABNORMAL LOW (ref 3.5–5.0)
Albumin: 2.3 g/dL — ABNORMAL LOW (ref 3.5–5.0)
Anion gap: 5 (ref 5–15)
Anion gap: 3 — ABNORMAL LOW (ref 5–15)
BUN: 23 mg/dL (ref 8–23)
BUN: 22 mg/dL (ref 8–23)
CO2: 28 mmol/L (ref 22–32)
CO2: 28 mmol/L (ref 22–32)
Calcium: 8.2 mg/dL — ABNORMAL LOW (ref 8.9–10.3)
Calcium: 8.3 mg/dL — ABNORMAL LOW (ref 8.9–10.3)
Chloride: 101 mmol/L (ref 98–111)
Chloride: 103 mmol/L (ref 98–111)
Creatinine, Ser: 0.3 mg/dL — ABNORMAL LOW (ref 0.61–1.24)
Creatinine, Ser: 0.33 mg/dL — ABNORMAL LOW (ref 0.61–1.24)
GFR, Estimated: >60 mL/min (ref >60)
GFR, Estimated: >60 mL/min (ref >60)
Glucose, Bld: 206 mg/dL — ABNORMAL HIGH (ref 70–99)
Glucose, Bld: 129 mg/dL — ABNORMAL HIGH (ref 70–99)
Phosphorus: 2.3 mg/dL — ABNORMAL LOW (ref 2.5–4.6)
Phosphorus: 2.8 mg/dL (ref 2.5–4.6)
Potassium: 4.3 mmol/L (ref 3.5–5.1)
Potassium: 4.4 mmol/L (ref 3.5–5.1)
Sodium: 134 mmol/L — ABNORMAL LOW (ref 135–145)
Sodium: 134 mmol/L — ABNORMAL LOW (ref 135–145)

## 2020-07-26 LAB — POCT I-STAT 7, (LYTES, BLD GAS, ICA,H+H)
Acid-Base Excess: 0 mmol/L (ref 0.0–2.0)
Bicarbonate: 30.1 mmol/L — ABNORMAL HIGH (ref 20.0–28.0)
Calcium, Ion: 1.33 mmol/L (ref 1.15–1.40)
HCT: 26 % — ABNORMAL LOW (ref 39.0–52.0)
Hemoglobin: 8.8 g/dL — ABNORMAL LOW (ref 13.0–17.0)
O2 Saturation: 90 %
Patient temperature: 97.4
Potassium: 4.4 mmol/L (ref 3.5–5.1)
Sodium: 140 mmol/L (ref 135–145)
TCO2: 33 mmol/L — ABNORMAL HIGH (ref 22–32)
pCO2 arterial: 81.1 mmHg (ref 32.0–48.0)
pH, Arterial: 7.174 — CL (ref 7.350–7.450)
pO2, Arterial: 75 mmHg — ABNORMAL LOW (ref 83.0–108.0)

## 2020-07-26 LAB — CBC
HCT: 25.2 % — ABNORMAL LOW (ref 39.0–52.0)
Hemoglobin: 7.7 g/dL — ABNORMAL LOW (ref 13.0–17.0)
MCH: 23.8 pg — ABNORMAL LOW (ref 26.0–34.0)
MCHC: 30.6 g/dL (ref 30.0–36.0)
MCV: 77.8 fL — ABNORMAL LOW (ref 80.0–100.0)
Platelets: 409 10*3/uL — ABNORMAL HIGH (ref 150–400)
RBC: 3.24 MIL/uL — ABNORMAL LOW (ref 4.22–5.81)
RDW: 31.9 % — ABNORMAL HIGH (ref 11.5–15.5)
WBC: 27.3 10*3/uL — ABNORMAL HIGH (ref 4.0–10.5)
nRBC: 8.9 % — ABNORMAL HIGH (ref 0.0–0.2)

## 2020-07-26 LAB — GLUCOSE, CAPILLARY
Glucose-Capillary: 119 mg/dL — ABNORMAL HIGH (ref 70–99)
Glucose-Capillary: 128 mg/dL — ABNORMAL HIGH (ref 70–99)
Glucose-Capillary: 145 mg/dL — ABNORMAL HIGH (ref 70–99)
Glucose-Capillary: 183 mg/dL — ABNORMAL HIGH (ref 70–99)
Glucose-Capillary: 186 mg/dL — ABNORMAL HIGH (ref 70–99)
Glucose-Capillary: 188 mg/dL — ABNORMAL HIGH (ref 70–99)

## 2020-07-26 LAB — LIPASE, BLOOD: Lipase: 28 U/L (ref 11–51)

## 2020-07-26 LAB — MAGNESIUM: Magnesium: 2.4 mg/dL (ref 1.7–2.4)

## 2020-07-26 LAB — PATHOLOGIST SMEAR REVIEW

## 2020-07-26 MED ORDER — POTASSIUM & SODIUM PHOSPHATES 280-160-250 MG PO PACK
1.0000 | PACK | Freq: Three times a day (TID) | ORAL | Status: AC
  Administered 2020-07-26 – 2020-07-27 (×3): 1
  Filled 2020-07-26 (×3): qty 1

## 2020-07-26 MED ORDER — ALBUMIN HUMAN 25 % IV SOLN
25.0000 g | Freq: Four times a day (QID) | INTRAVENOUS | Status: AC
  Administered 2020-07-26 (×3): 25 g via INTRAVENOUS
  Filled 2020-07-26 (×3): qty 100

## 2020-07-26 MED ORDER — SODIUM CHLORIDE 0.9 % IV SOLN: INTRAVENOUS | Status: DC | PRN

## 2020-07-26 MED ORDER — INSULIN ASPART 100 UNIT/ML IJ SOLN
6.0000 [IU] | INTRAMUSCULAR | Status: DC
  Administered 2020-07-26 – 2020-07-29 (×15): 6 [IU] via SUBCUTANEOUS

## 2020-07-26 MED ORDER — HYDROCORTISONE NA SUCCINATE PF 100 MG IJ SOLR
50.0000 mg | Freq: Four times a day (QID) | INTRAMUSCULAR | Status: DC
  Administered 2020-07-26 – 2020-07-27 (×3): 50 mg via INTRAVENOUS
  Filled 2020-07-26 (×3): qty 2

## 2020-07-26 NOTE — Procedures (Signed)
Arterial Catheter Insertion Procedure Note  LOUIS BOLSINGER  IG:[id number]  16-Mar-1950  Date:07/26/20  Time:5:45 PM    Provider Performing: Mick Sell    Procedure: Insertion of Arterial Line [phone]) with US guidance [id number])   Indication(s) Blood pressure monitoring and/or need for frequent ABGs  Consent Unable to obtain consent due to emergent nature of procedure.  Anesthesia None   Time Out Verified patient identification, verified procedure, site/side was marked, verified correct patient position, special equipment/implants available, medications/allergies/relevant history reviewed, required imaging and test results available.   Sterile Technique Maximal sterile technique including full sterile barrier drape, hand hygiene, sterile gown, sterile gloves, mask, hair covering, sterile ultrasound probe cover (if used).   Procedure Description Area of catheter insertion was cleaned with chlorhexidine and draped in sterile fashion. With real-time ultrasound guidance an arterial catheter was placed into the right radial artery.  Appropriate arterial tracings confirmed on monitor.     Complications/Tolerance None; patient tolerated the procedure well.   EBL Minimal   Specimen(s) None  JD Rexene Agent [facility] 07/26/2020, 5:45 PM  Please see [email or web address] for pager details.  From 7A-7P if no response, please call [phone]. After hours, please call ELink [phone].

## 2020-07-26 NOTE — Progress Notes (Addendum)
[facility] Progress Note    Assessment/ Plan:   AKI -baseline Cr 0.4. AKI likely secondary to ATN in the context of shock. CRRT initiated on 7/6 for anasarca with ineffective diuresis and uremia. Appreciate assistance from CCM for rij temp line placement 7/6 -Not on an ideal long term candidate for HD due to poor functional status, vent dependency. Dr. Candiss Norse voiced this concern to both son and wife over the phone on 7/6 and they had wished to still proceed forward with renal replacement therapy. Appreciate palliative care's assistance.  I again discussed concern for long term dialysis to wife and son 7/12; they remain hopeful for renal recovery.  -In light of ongoing shock physiology cont CRRT for now -Avoid nephrotoxic medications including NSAIDs and iodinated intravenous contrast exposure unless the latter is absolutely indicated.  Preferred narcotic agents for pain control are hydromorphone, fentanyl, and methadone. Morphine should not be used. Avoid Baclofen and avoid oral sodium phosphate and magnesium citrate based laxatives / bowel preps. Continue strict Input and Output monitoring. Will monitor the patient closely with you and intervene or adjust therapy as indicated by changes in clinical status/labs   Anasarca: improved, volume is mostly extravascular and trying to prevent ongoing AKI so run net even today  CKD-MBD, hyperphosphatemia -managing with crrt, continue to monitor phos  Acute metabolic encephalopathy, likely related to sepsis and uremia -crrt as above, apparently alert and oriented at baseline.  Septic shock:   -pressor support and abx per ccm. On midodrine -ID following and recollecting cultures, imaging -WBC improving  Chronic respiratory failure s/p trach -vent mgmt per ccm  Quadriplegia 2/2 compressive myelopathy -resides at [facility] -appreciate palliative care's assistance  Microcytic anemia -transfuse for hgb <7. Avoid iron for now in light  of active infection -hgb 8.4 > 7.7  Subjective:   Seen and examined on CRRT. Remains on  pressors with hypothermia too -- reeval for infection underway per ID direction.  Fio2 30%.Remains anuric.  Net +1.1L yesterday with CRRT.    Objective:   BP (!) 99/57   Pulse 73   Temp (!) 97.5 F (36.4 C) (Axillary)   Resp (!) 25   Ht _0  (1.676 m) Comment: Simultaneous filing. User may not have seen previous data.  Wt 78.9 kg   SpO2 97%   BMI 28.08 kg/m   Intake/Output Summary (Last 24 hours) at 07/26/2020 0954 Last data filed at 07/26/2020 0900 Gross per 24 hour  Intake 3767.91 ml  Output 2913 ml  Net 854.91 ml    Weight change: -0.6 kg  Physical Exam: IRW:[id number] ill appearing CVS:s1s2, rrr Resp:trach, bl chest expansion PPJ:KDTO Ext:1+ diffuse edema Neuro: quadriplegic, opening eyes   Imaging: DG Chest Port 1 View  Result Date: 07/24/2020 CLINICAL DATA:  Respiratory failure.  Sepsis. EXAM: PORTABLE CHEST 1 VIEW COMPARISON:  Single-view of the chest 07/21/2020 and 07/18/2020. FINDINGS: Tracheostomy tube and right IJ catheter remain in place. Bilateral mid and lower lung zone airspace disease and effusions persist. Effusion on the right appears increased. No pneumothorax. Heart size is normal. IMPRESSION: Bilateral pleural effusions and mid and lower lung zone airspace disease. Left effusion appears somewhat increased. The appearance of the chest is otherwise unchanged. Electronically Signed   By: Inge Rise M.D.   On: 07/24/2020 14:57   DG Abd Portable 1V  Result Date: 07/24/2020 CLINICAL DATA:  Diarrhea in a patient with sepsis. EXAM: PORTABLE ABDOMEN - 1 VIEW COMPARISON:  Single-view of the abdomen 10/11/2019. CT abdomen and  pelvis 07/17/2020. FINDINGS: The bowel gas pattern is normal. No radio-opaque calculi or other significant radiographic abnormality are seen. PEG tube and right double-J ureteral stent again seen IMPRESSION: No acute finding. Peg tube and right  double-J ureteral stent in place. Electronically Signed   By: Inge Rise M.D.   On: 07/24/2020 14:55   US Abdomen Limited RUQ (LIVER/GB)  Result Date: 07/25/2020 CLINICAL DATA:  Transaminitis. History of hypertension, diabetes and chronic kidney disease. EXAM: ULTRASOUND ABDOMEN LIMITED RIGHT UPPER QUADRANT COMPARISON:  Renal ultrasound 07/18/2020.  Abdominal CT 07/17/2020. FINDINGS: Gallbladder: The gallbladder is difficult to confidently identify. There is shadowing in the subhepatic space which could reflect the presence of multiple gallstones or adjacent bowel gas. The gallbladder was present on recent CT with questionable stones. Unable to visualize the gallbladder wall. Unable to assess for sonographic Murphy's sign due to patient unresponsiveness. Common bile duct: Diameter: 4 mm Liver: The liver is heterogeneous in echotexture with mild contour irregularity, suggesting cirrhosis. No focal lesions are identified. Portal vein is patent on color Doppler imaging with normal direction of blood flow towards the liver. Other: The previously identified ascites appears improved. There is a small right pleural effusion. Right renal atrophy and right renal cyst are noted incidentally. Study is technically difficult. IMPRESSION: 1. Limited study due to body habitus and reported patient unresponsiveness. 2. Echogenic shadowing structure in the right upper quadrant suspicious for gallbladder filled with stones (wall echo shadow sign). No evidence of biliary dilatation. 3. Heterogeneous hepatic parenchyma without demonstrated focal abnormality. Mild perihepatic ascites. Electronically Signed   By: Richardean Sale M.D.   On: 07/25/2020 17:21    Labs: BMET Recent Labs  Lab 07/23/20 1600 07/24/20 0306 07/24/20 1553 07/25/20 0319 07/25/20 1721 07/25/20 1955 07/26/20 0500  NA 136 135 136 134* 132* 133* 134*  K 4.2 4.6 4.9 4.6 6.0* 4.4 4.3  CL 103 102 104 102 104 102 101  CO2 _0 GLUCOSE 214* 286* 167* 187* 127* 202* 206*  BUN 26* 27* 24* 25* 23 25* 23  CREATININE 0.38* 0.33* 0.32* 0.31* 0.31* 0.31* 0.30*  CALCIUM 8.2* 8.3* 8.4* 8.2* 7.8* 8.1* 8.2*  PHOS 2.6 2.6 2.7 2.8 2.8 2.7 2.3*    CBC Recent Labs  Lab 07/24/20 0306 07/24/20 1009 07/25/20 0319 07/26/20 0500  WBC 38.6* 40.6* 36.8* 27.3*  NEUTROABS  --  36.3*  --   --   HGB 7.8* 8.2* 8.4* 7.7*  HCT 24.9* 26.0* 26.7* 25.2*  MCV 75.7* 74.5* 76.7* 77.8*  PLT 448* 488* 500* 409*     Medications:     amiodarone  200 mg Per Tube Daily   apixaban  5 mg Per Tube BID   B-complex with vitamin C  1 tablet Per Tube Daily   buPROPion  75 mg Per Tube BID   chlorhexidine gluconate (MEDLINE KIT)  15 mL Mouth Rinse BID   Chlorhexidine Gluconate Cloth  6 each Topical Q1200   docusate  100 mg Per Tube BID   feeding supplement (PROSource TF)  90 mL Per Tube TID   guaiFENesin  5 mL Per Tube BID   hydrocortisone sod succinate (SOLU-CORTEF) inj  50 mg Intravenous Q12H   insulin aspart  0-20 Units Subcutaneous Q4H   insulin aspart  6 Units Subcutaneous Q4H   insulin glargine  20 Units Subcutaneous BID   mouth rinse  15 mL Mouth Rinse 10 times per day   midodrine  15 mg Per  Tube Q8H   pantoprazole sodium  40 mg Per Tube QHS   polyethylene glycol  17 g Per Tube Daily   sertraline  100 mg Per Tube Daily   sodium chloride flush  10-40 mL Intracatheter Q12H     Jannifer Hick MD [facility] Pager [phone]

## 2020-07-26 NOTE — Progress Notes (Addendum)
[facility] Infectious Diseases Follow Up Note  Patient Identification: Patient Name: Luis Orr MRN: [id number] [place] Date: 06/15/2020  6:13 PM Age: 70 y.o.Today's Date: 07/26/2020   Reason for Visit: shock  Active Problems:   Septic shock ([place])   Pressure injury of skin   Acute respiratory failure with hypoxia (HCC)   AKI (acute kidney injury) ([place])   Hypotension   Antibiotics: Vancomycin 6/29-6/30, Vancomycin 7/12-current                    Meropenem 6/29-7/2                    Unasyn 7/2-current   Lines/Tubes: RT IJ HD catheter , LLQ colostomy, Urethral catheter , tracheostomy, PIVs    Assessment 3 Y O male from [facility] with hx  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 Afebrile on presentation but had leukocytosis of 23, requiring pressors, possibly in the setting of HAP. Resp cx 6/30 growing Amp S Acinetobacter baumannii   Shock: Septic vs distributive -last episode of hypothermia was 7/11, temperature WNL  -Leukocytosis is also improving 40>36>27 - He is intermittently on low dose levophed, Overnight required up to Levophed of 6  which by this morning was at 2.  Per RN , levophed has to be titrated up when patient goes to sleep.  - Concern for CLABSI given RT IJ HD Cathter. Blood cultures 7/12 NG in 2 days  - Less likely to be related to PNA given stable oxygen requirements and being on appropriate tx for HAP - ? Adrenal insufficiency   Abnormal LFTs/Liver Cirrhosis: RUQ [place] with stones but no biliary dilatation  ( however limited study). 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 Would also add CT abd/pelvis with CT chest given abnormal LFTS, poor quality Rt UQ ultrasound and hypotension.  Fu blood cultures  ? Needs repeat echo to assess cardiac cause  Monitor CBC and BMP   Fu blood cultures ( concerns for CLABS), UA was unremarkable. Low concerns for C diff at this time.   Rest of the management as per the primary team. Thank you for the consult. Please page with pertinent questions or concerns. _____________________________________________________________________ Subjective patient seen and examined at the bedside. Spoke with RN at bedside, on levophed 4, Fi02 30%  Vitals BP (!) 99/57   Pulse 73   Temp (!) 97.5 F (36.4 C) (Axillary)   Resp (!) 25   Ht '5\' 6"'$  (1.676 m) Comment: Simultaneous filing. User may not have seen previous data.  Wt 78.9 kg   SpO2 97%   BMI 28.08 kg/m     Physical Exam Constitutional:  lying in bed, appears sick    Comments:   Cardiovascular:     Rate and Rhythm: Normal rate and regular rhythm.  Heart sounds:   Pulmonary:     Effort: Trach to vent     Comments:   Abdominal:     Palpations: Abdomen is soft.     Tenderness: LLQ colostomy with liquid stool   Musculoskeletal:        General: No swelling or tenderness.   Skin:    Comments: No obvious lesions or rashes   Neurological:     General: unable to assess    Psychiatric:        Mood and Affect: unable to assess   Pertinent Microbiology Results for orders placed or performed during the hospital encounter of 07/12/2020  Urine culture     Status: Abnormal   Collection Time:  06/19/2020  6:24 PM   Specimen: Urine, Random  Result Value Ref Range Status   Specimen Description URINE, RANDOM  Final   Special Requests   Final    NONE Performed at [facility], [place] [address], [place], [place] [address]    Culture MULTIPLE SPECIES PRESENT, SUGGEST RECOLLECTION (A)  Final   Report Status 07/13/2020 FINAL  Final  Culture, blood (routine x 2)     Status: Abnormal   Collection Time: 07/06/2020  6:42 PM   Specimen: BLOOD LEFT FOREARM  Result Value Ref Range Status   Specimen Description BLOOD LEFT FOREARM  Final   Special Requests   Final    BOTTLES DRAWN AEROBIC AND ANAEROBIC Blood Culture results may not be optimal due to an inadequate volume of blood received in culture bottles   Culture  Setup Time   Final    ANAEROBIC BOTTLE ONLY GRAM POSITIVE COCCI CRITICAL RESULT CALLED TO, READ BACK BY AND VERIFIED WITH: G ABBOTT PHARMD 07/13/20 0207 JDW    Culture (A)  Final    STAPHYLOCOCCUS AURICULARIS THE SIGNIFICANCE OF ISOLATING THIS ORGANISM FROM A SINGLE SET OF BLOOD CULTURES WHEN MULTIPLE SETS ARE DRAWN IS UNCERTAIN. PLEASE NOTIFY THE MICROBIOLOGY DEPARTMENT WITHIN ONE WEEK IF SPECIATION AND SENSITIVITIES ARE REQUIRED. Performed at [facility], [place] [address], [place], [place] [address]    Report Status 07/14/2020 FINAL  Final  Blood Culture ID Panel (Reflexed)     Status: Abnormal   Collection Time: 06/13/2020  6:42 PM  Result Value Ref Range Status   Enterococcus faecalis NOT DETECTED NOT DETECTED Final   Enterococcus Faecium NOT DETECTED NOT DETECTED Final   Listeria monocytogenes NOT DETECTED NOT DETECTED Final   Staphylococcus species DETECTED (A) NOT DETECTED Final    Comment: CRITICAL RESULT CALLED TO, READ BACK BY AND VERIFIED WITH: G ABBOTT PHARMD 07/13/20 0207 JDW    Staphylococcus aureus (BCID) NOT DETECTED NOT DETECTED Final   Staphylococcus epidermidis NOT DETECTED NOT DETECTED Final   Staphylococcus lugdunensis NOT DETECTED NOT DETECTED Final    Streptococcus species NOT DETECTED NOT DETECTED Final   Streptococcus agalactiae NOT DETECTED NOT DETECTED Final   Streptococcus pneumoniae NOT DETECTED NOT DETECTED Final   Streptococcus pyogenes NOT DETECTED NOT DETECTED Final   A.calcoaceticus-baumannii NOT DETECTED NOT DETECTED Final   Bacteroides fragilis NOT DETECTED NOT DETECTED Final   Enterobacterales NOT DETECTED NOT DETECTED Final   Enterobacter cloacae complex NOT DETECTED NOT DETECTED Final   Escherichia coli NOT DETECTED NOT DETECTED Final   Klebsiella aerogenes NOT DETECTED NOT DETECTED Final   Klebsiella oxytoca NOT DETECTED NOT DETECTED Final   Klebsiella pneumoniae NOT DETECTED NOT DETECTED Final   Proteus species NOT DETECTED NOT DETECTED Final   Salmonella species NOT  DETECTED NOT DETECTED Final   Serratia marcescens NOT DETECTED NOT DETECTED Final   Haemophilus influenzae NOT DETECTED NOT DETECTED Final   Neisseria meningitidis NOT DETECTED NOT DETECTED Final   Pseudomonas aeruginosa NOT DETECTED NOT DETECTED Final   Stenotrophomonas maltophilia NOT DETECTED NOT DETECTED Final   Candida albicans NOT DETECTED NOT DETECTED Final   Candida auris NOT DETECTED NOT DETECTED Final   Candida glabrata NOT DETECTED NOT DETECTED Final   Candida krusei NOT DETECTED NOT DETECTED Final   Candida parapsilosis NOT DETECTED NOT DETECTED Final   Candida tropicalis NOT DETECTED NOT DETECTED Final   Cryptococcus neoformans/gattii NOT DETECTED NOT DETECTED Final    Comment: Performed at [facility], [place] [address] [place], [place] [address]  Resp Panel by RT-PCR (Flu A&B, Covid) Nasopharyngeal Swab     Status: None   Collection Time: 06/30/2020  7:22 PM   Specimen: Nasopharyngeal Swab; Nasopharyngeal(NP) swabs in vial transport medium  Result Value Ref Range Status   SARS Coronavirus 2 by RT PCR NEGATIVE NEGATIVE Final    Comment: (NOTE) SARS-CoV-2 target nucleic acids are NOT DETECTED.  The SARS-CoV-2 RNA is generally  detectable in upper respiratory specimens during the acute phase of infection. The lowest concentration of SARS-CoV-2 viral copies this assay can detect is 138 copies/mL. A negative result does not preclude SARS-Cov-2 infection and should not be used as the sole basis for treatment or other patient management decisions. A negative result may occur with  improper specimen collection/handling, submission of specimen other than nasopharyngeal swab, presence of viral mutation(s) within the areas targeted by this assay, and inadequate number of viral copies(<138 copies/mL). A negative result must be combined with clinical observations, patient history, and epidemiological information. The expected result is Negative.  Fact Sheet for Patients:  EntrepreneurPulse.com.au  Fact Sheet for Healthcare Providers:  IncredibleEmployment.be  This test is no t yet approved or cleared by the [place] FDA and  has been authorized for detection and/or diagnosis of SARS-CoV-2 by FDA under an Emergency Use Authorization (EUA). This EUA will remain  in effect (meaning this test can be used) for the duration of the COVID-19 declaration under Section 564(b)(1) of the Act, 21 U.S.C.section 360bbb-3(b)(1), unless the authorization is terminated  or revoked sooner.       Influenza A by PCR NEGATIVE NEGATIVE Final   Influenza B by PCR NEGATIVE NEGATIVE Final    Comment: (NOTE) The Xpert Xpress SARS-CoV-2/FLU/RSV plus assay is intended as an aid in the diagnosis of influenza from Nasopharyngeal swab specimens and should not be used as a sole basis for treatment. Nasal washings and aspirates are unacceptable for Xpert Xpress SARS-CoV-2/FLU/RSV testing.  Fact Sheet for Patients: EntrepreneurPulse.com.au  Fact Sheet for Healthcare Providers: IncredibleEmployment.be  This test is not yet approved or cleared by the [place] FDA  and has been authorized for detection and/or diagnosis of SARS-CoV-2 by FDA under an Emergency Use Authorization (EUA). This EUA will remain in effect (meaning this test can be used) for the duration of the COVID-19 declaration under Section 564(b)(1) of the Act, 21 U.S.C. section 360bbb-3(b)(1), unless the authorization is terminated or revoked.  Performed at [facility], [place] [address], [place], [place] [address]   Culture, blood (routine x 2)     Status: None   Collection Time: 06/21/2020  7:23 PM   Specimen: BLOOD  Result Value Ref Range Status   Specimen Description BLOOD RIGHT ANTECUBITAL  Final   Special Requests  Final    BOTTLES DRAWN AEROBIC AND ANAEROBIC Blood Culture adequate volume   Culture   Final    NO GROWTH 5 DAYS Performed at [facility], [place] [address], [place], [place] [address]    Report Status 07/16/2020 FINAL  Final  MRSA Next Gen by PCR, Nasal     Status: Abnormal   Collection Time: 06/18/2020 10:29 PM   Specimen: Nasal Mucosa; Nasal Swab  Result Value Ref Range Status   MRSA by PCR Next Gen DETECTED (A) NOT DETECTED Final    Comment: RESULT CALLED TO, READ BACK BY AND VERIFIED WITH: Ayesha Rumpf RN 07/12/20 0428 JDW (NOTE) The GeneXpert MRSA Assay (FDA approved for NASAL specimens only), is one component of a comprehensive MRSA colonization surveillance program. It is not intended to diagnose MRSA infection nor to guide or monitor treatment for MRSA infections. Test performance is not FDA approved in patients less than 72 years old. Performed at [facility], [place] [address], [place], [place] [address]   Culture, Respiratory w Gram Stain     Status: None   Collection Time: 07/12/20  3:48 PM   Specimen: Tracheal Aspirate; Respiratory  Result Value Ref Range Status   Specimen Description TRACHEAL ASPIRATE  Final   Special Requests NONE  Final   Gram Stain   Final    MODERATE WBC PRESENT, PREDOMINANTLY PMN FEW GRAM NEGATIVE RODS FEW  GRAM POSITIVE COCCI IN PAIRS    Culture   Final    MODERATE ACINETOBACTER BAUMANNII MULTI-DRUG RESISTANT ORGANISM RESULT CALLED TO, READ BACK BY AND VERIFIED WITH: RN J.BEABRAUT ON ET:[id number] AT 1410 BY E.PARRISH Performed at [facility], [place] [address] [place], [place] [address]    Report Status 07/14/2020 FINAL  Final   Organism ID, Bacteria ACINETOBACTER BAUMANNII  Final      Susceptibility   Acinetobacter baumannii - MIC*    CEFTAZIDIME 32 RESISTANT Resistant     CIPROFLOXACIN >=4 RESISTANT Resistant     GENTAMICIN >=16 RESISTANT Resistant     IMIPENEM >=16 RESISTANT Resistant     PIP/TAZO >=128 RESISTANT Resistant     TRIMETH/SULFA >=320 RESISTANT Resistant     AMPICILLIN/SULBACTAM 8 SENSITIVE Sensitive     * MODERATE ACINETOBACTER BAUMANNII  Culture, Urine     Status: Abnormal   Collection Time: 07/13/20 11:26 AM   Specimen: Urine, Random  Result Value Ref Range Status   Specimen Description URINE, RANDOM  Final   Special Requests   Final    NONE Performed at [facility], [place] [address], [place], [place] [address]    Culture MULTIPLE SPECIES PRESENT, SUGGEST RECOLLECTION (A)  Final   Report Status 07/14/2020 FINAL  Final  Culture, blood (routine x 2)     Status: None (Preliminary result)   Collection Time: 07/24/20  3:53 PM   Specimen: BLOOD RIGHT HAND  Result Value Ref Range Status   Specimen Description BLOOD RIGHT HAND  Final   Special Requests   Final    BOTTLES DRAWN AEROBIC AND ANAEROBIC Blood Culture results may not be optimal due to an inadequate volume of blood received in culture bottles   Culture   Final    NO GROWTH 2 DAYS Performed at [facility], [place] [address] [place], [place] [address]    Report Status PENDING  Incomplete  Culture, blood (routine x 2)     Status: None (Preliminary result)   Collection Time: 07/24/20  4:10 PM  Specimen: BLOOD LEFT HAND  Result Value Ref Range Status   Specimen Description BLOOD LEFT HAND  Final    Special Requests   Final    BOTTLES DRAWN AEROBIC AND ANAEROBIC Blood Culture adequate volume   Culture   Final    NO GROWTH 2 DAYS Performed at [facility], [address], [place], [place] [address]    Report Status PENDING  Incomplete      Pertinent Lab. CBC Latest Ref Rng & Units 07/26/2020 07/25/2020 07/24/2020  WBC 4.0 - 10.5 K/uL 27.3(H) 36.8(H) 40.6(H)  Hemoglobin 13.0 - 17.0 g/dL 7.7(L) 8.4(L) 8.2(L)  Hematocrit 39.0 - 52.0 % 25.2(L) 26.7(L) 26.0(L)  Platelets 150 - 400 K/uL 409(H) 500(H) 488(H)   CMP Latest Ref Rng & Units 07/26/2020 07/25/2020 07/25/2020  Glucose 70 - 99 mg/dL 206(H) 202(H) 127(H)  BUN 8 - 23 mg/dL 23 25(H) 23  Creatinine 0.61 - 1.24 mg/dL 0.30(L) 0.31(L) 0.31(L)  Sodium 135 - 145 mmol/L 134(L) 133(L) 132(L)  Potassium 3.5 - 5.1 mmol/L 4.3 4.4 6.0(H)  Chloride 98 - 111 mmol/L 101 102 104  CO2 22 - 32 mmol/L '28 28 23  '$ Calcium 8.9 - 10.3 mg/dL 8.2(L) 8.1(L) 7.8(L)  Total Protein 6.5 - 8.1 g/dL 7.6 - -  Total Bilirubin 0.3 - 1.2 mg/dL 1.0 - -  Alkaline Phos 38 - 126 U/L 451(H) - -  AST 15 - 41 U/L 341(H) - -  ALT 0 - 44 U/L 142(H) - -    Pertinent Imaging today Plain films and CT images have been personally visualized and interpreted; radiology reports have been reviewed. Decision making incorporated into the Impression / Recommendations.  CT abdomen/pelvis 07/17/20 IMPRESSION: 1. Gastrostomy tube noted without evidence for fluid collection around the gastrostomy tube. 2. Marked atrophy of the right kidney with internal ureteral stent in situ. Proximal loop is formed in the posterior aspect of the upper pole, presumably in an upper pole calyx although this cannot be confirmed on today's study. 3. Stone along the mid right ureter could be in the ureter or adjacent gonadal vein. 4. Patchy and consolidative airspace disease in both lower lungs suggesting multifocal pneumonia. Small bilateral pleural effusions. 5. Small volume ascites. 6.  Diffuse body wall edema. 7. Aortic Atherosclerosis (ICD10-I70.0).  TTE   1. Very technically difficulat study. In one view (contrast A4C) the  right ventricle appears moderately enlarged with reduced function. Left  ventricular ejection fraction, by estimation, is 50 to 55%. The left  ventricle has low normal function. Left  ventricular endocardial border not optimally defined to evaluate regional  wall motion, even with contrast administration. There is mild concentric  left ventricular hypertrophy. Left ventricular diastolic parameters are  indeterminate.   2. The mitral valve was not assessed. No evidence of mitral valve  regurgitation.   3. The aortic valve was not assessed. Aortic valve regurgitation is not  visualized.   4. Aortic dilatation noted. There is mild dilatation of the aortic root,  measuring 40 mm.   07/25/20 RUQ US  IMPRESSION: 1. Limited study due to body habitus and reported patient unresponsiveness. 2. Echogenic shadowing structure in the right upper quadrant suspicious for gallbladder filled with stones (wall echo shadow sign). No evidence of biliary dilatation. 3. Heterogeneous hepatic parenchyma without demonstrated focal abnormality. Mild perihepatic ascites.     I spent more than 35 minutes for this patient encounter including review of prior medical records, coordination of care  with greater than 50% of time being  face to face/counseling and discussing diagnostics/treatment plan with the patient/family.  Electronically signed by:   Rosiland Oz, MD Infectious Disease Physician [facility] Pager: [phone]

## 2020-07-26 NOTE — Progress Notes (Signed)
Patient transported to CT and back to [id number] without complication.

## 2020-07-26 NOTE — Progress Notes (Signed)
NAME:  Luis Orr, MRN:  [id number], DOB:  [birth year], LOS: 82 ADMISSION DATE:  06/15/2020, CONSULTATION DATE:  07/03/2020 REFERRING MD:  Dr Francia Greaves, EDP CHIEF COMPLAINT:  septic shock    History of Present Illness:  70 yo male from [facility] with hx of C3-C5 compressive myelopathy with functional quadriplegia and chronic trach/vent presented to ED with altered mental status and hypotension with recurrent HCAP.  Has multiple recent admissions for sepsis secondary to various infections including UTIs, pneumonias and bacteremia in setting of MDR Klebsiella, MRSA and pseudomonas. Most recent admission September 2021 in setting of sepsis due to MDR Klebsiella with right ureteral calculus s/p double J ureteral stent placement and treated with meropenem x 10days.  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 Central line placed. Drainage from gastrostomy tube >> CT Abd without fluid collection around gastrostomy tube but with diffuse body wall edema. Echo EF 50-55%, mild LVH  7/6 Renal Replacement Therapy ; Code status changed to partial Code (DNR) 7/7 Transfuse 1U PRBC 7/9 add solu cortef 7/10 off pressors, weaning stress steroids  7/11 Pressors added back on again 7/12 ID consult due to rising WBC, hypothermia, hypotension  concern for worsening infection. Vancomycin added. Repeat blood cx collected.  Interim History / Subjective:   Afebrile, WBC 27.3 from 36.8 On PRVC mech vent Non verbal but awake On 2 of Levo Bair hugger in place  Objective   Blood pressure (!) 116/55, pulse 72, temperature (!) 97.5 F (36.4 C), temperature source Axillary, resp. rate (!) 21, height '5\' 6"'$  (1.676 m), weight 78.9 kg, SpO2 97 %.    Vent Mode: PRVC FiO2 (%):  [30 %] 30 % Set Rate:  [22 bmp] 22 bmp Vt Set:  [380 mL] 380 mL PEEP:  [5 cmH20] 5 cmH20 Plateau Pressure:  [20 cmH20-27 cmH20] 27 cmH20   Intake/Output Summary (Last 24 hours) at 07/26/2020 0910 Last data filed at 07/26/2020 0800 Gross per 24 hour  Intake 3933.25 ml  Output 2845 ml  Net 1088.25 ml    Filed Weights   07/24/20 0500 07/25/20 0500 07/26/20 0446  Weight: 82.7 kg 79.5 kg 78.9 kg   Examination:  General:  critically ill appearing male on mech vent; bair hugger in place HEENT: MM pink/moist; trach and right HD in place Neuro: awake; non verbal; quadriplegic CV: s1s2, RRR, no m/r/g PULM:  dim clear bs bilaterally GI: soft, bsx4 active; peg tube in place Extremities: warm/dry, 3+ BLE edema   Labs Na 134, K 4.3, Mag 2.4 Glucose range 145-206 Creat 0.3 WBC 27.3 (from 36.8) Hgb 7.7 BC no growth 2 days  Resolved Hospital Problem list   Hyperkalemia Tachycardia Hypotension Thrombocytosis  AKI  Assessment & Plan:  Septic shock 2nd to HCAP and UTI all present on admission.: Acinetobacter in sputum culture from 07/12/20. P: -wean levo MAP >65 -continue steroids  and midodrine -continue unasyn and vanc -follow blood cultures -f/u pathology smear -trend CBC/fever  Acute on chronic hypoxic respiratory failure. Tracheostomy and ventilator dependent status. P: -continue mech vent support -trach care and VAP prevention in place -pulmonary hygiene -Sat goal >92%  Transaminitis, likely 2/2 shock liver: Differential includes viral  hepatitis, drug-induced, hepatic ischemia from shock, adrenal insufficiency. Previous CT on 7/5 showed no focal liver abnormality but noted possible sludge or stone in gallbladder without intra or extra-hepatic biliary dilation.  P: -supportive care  Hypothermia P: -Continue steroids -on broad spectrum abx -Continue bair-hugger -trend CBC/fever  DM II poorly controlled with hyperglycemia. CBG improved with only one reading >200 in last 24 hours.  P: -Glucose range 145-206; likely elevated with steroid use; consider increasing tomorrow if persistently high -Continue Lantus 20U BID -Continue tube feed coverage -Continue resistant SSI and CBG monitoring  Anasarca P: -continue CRRT per nephrology  Anemia of critical illness, chronic disease and iron deficiency: s/p 1U PRBC transfusion on 7/02 and 7/07; holding iron supplementation in setting of infection P: -transfuse for hgb <7 -trend cbc  Severe protein calorie malnutrition (POA) P: - continue tube feeds  Paroxysmal atrial fibrillation. P: -Continue amiodarone and Apixiban  Sacral decubitus ulcer stage IV (POA) P: - wound care  Depression P: -continue Wellbutrin and sertraline  Goals of Care P: - continue medical therapy - DNR in event of cardiac arrest  Best Practice (right click and "Reselect all SmartList Selections" daily)   Diet/type: tubefeeds DVT prophylaxis: prophylactic heparin ,  GI prophylaxis: PPI Lines: Dialysis Catheter and yes and it is still needed Foley:  Yes, and it is still needed Code Status:  limited Disposition: likely back to [facility] once he is off CRRT and tolerating HD  Labs:   CMP Latest Ref Rng & Units 07/26/2020 07/25/2020 07/25/2020  Glucose 70 - 99 mg/dL 206(H) 202(H) 127(H)  BUN 8 - 23 mg/dL 23 25(H) 23  Creatinine 0.61 - 1.24 mg/dL 0.30(L) 0.31(L) 0.31(L)  Sodium 135 - 145 mmol/L 134(L) 133(L) 132(L)  Potassium 3.5 - 5.1 mmol/L 4.3 4.4 6.0(H)  Chloride 98 - 111 mmol/L 101  102 104  CO2 22 - 32 mmol/L '28 28 23  '$ Calcium 8.9 - 10.3 mg/dL 8.2(L) 8.1(L) 7.8(L)  Total Protein 6.5 - 8.1 g/dL 7.6 - -  Total Bilirubin 0.3 - 1.2 mg/dL 1.0 - -  Alkaline Phos 38 - 126 U/L 451(H) - -  AST 15 - 41 U/L 341(H) - -  ALT 0 - 44 U/L 142(H) - -    CBC Latest Ref Rng & Units 07/26/2020 07/25/2020 07/24/2020  WBC 4.0 - 10.5 K/uL 27.3(H) 36.8(H) 40.6(H)  Hemoglobin 13.0 - 17.0 g/dL 7.7(L) 8.4(L) 8.2(L)  Hematocrit 39.0 - 52.0 % 25.2(L) 26.7(L) 26.0(L)  Platelets 150 - 400 K/uL 409(H) 500(H) 488(H)    ABG    Component Value Date/Time   PHART 7.295 (L) 07/19/2020 0016   PCO2ART 58.9 (H) 07/19/2020 0016   PO2ART 81 (L) 07/19/2020 0016   HCO3 28.9 (H) 07/19/2020 0016   TCO2 31 07/19/2020 0016   O2SAT 95.0 07/19/2020 0016    CBG (last 3)  Recent Labs    07/25/20 2304 07/26/20 0302 07/26/20 0710  GLUCAP 119* 145* 186*     Signature: 35 minutes  JD Rexene Agent [facility] 07/26/2020, 9:30 AM  Please see [email or web address] for pager details.  From 7A-7P if no response, please call [phone]. After hours, please call ELink [phone].

## 2020-07-27 ENCOUNTER — Inpatient Hospital Stay (HOSPITAL_COMMUNITY): Payer: 59

## 2020-07-27 DIAGNOSIS — D649 Anemia, unspecified: Secondary | ICD-10-CM | POA: Diagnosis not present

## 2020-07-27 DIAGNOSIS — R579 Shock, unspecified: Secondary | ICD-10-CM

## 2020-07-27 DIAGNOSIS — N179 Acute kidney failure, unspecified: Secondary | ICD-10-CM | POA: Diagnosis not present

## 2020-07-27 DIAGNOSIS — J189 Pneumonia, unspecified organism: Secondary | ICD-10-CM | POA: Diagnosis not present

## 2020-07-27 DIAGNOSIS — Z9911 Dependence on respirator [ventilator] status: Secondary | ICD-10-CM

## 2020-07-27 DIAGNOSIS — A419 Sepsis, unspecified organism: Secondary | ICD-10-CM | POA: Diagnosis not present

## 2020-07-27 DIAGNOSIS — R601 Generalized edema: Secondary | ICD-10-CM | POA: Diagnosis not present

## 2020-07-27 DIAGNOSIS — J9601 Acute respiratory failure with hypoxia: Secondary | ICD-10-CM | POA: Diagnosis not present

## 2020-07-27 LAB — POCT I-STAT 7, (LYTES, BLD GAS, ICA,H+H)
Acid-Base Excess: 0 mmol/L (ref 0.0–2.0)
Bicarbonate: 27.9 mmol/L (ref 20.0–28.0)
Calcium, Ion: 1.25 mmol/L (ref 1.15–1.40)
HCT: 31 % — ABNORMAL LOW (ref 39.0–52.0)
Hemoglobin: 10.5 g/dL — ABNORMAL LOW (ref 13.0–17.0)
O2 Saturation: 95 %
Patient temperature: 97.5
Potassium: 4.3 mmol/L (ref 3.5–5.1)
Sodium: 141 mmol/L (ref 135–145)
TCO2: 30 mmol/L (ref 22–32)
pCO2 arterial: 60.2 mmHg — ABNORMAL HIGH (ref 32.0–48.0)
pH, Arterial: 7.271 — ABNORMAL LOW (ref 7.350–7.450)
pO2, Arterial: 88 mmHg (ref 83.0–108.0)

## 2020-07-27 LAB — PROCALCITONIN: Procalcitonin: 0.49 ng/mL

## 2020-07-27 LAB — CBC
HCT: 21.3 % — ABNORMAL LOW (ref 39.0–52.0)
Hemoglobin: 6.6 g/dL — CL (ref 13.0–17.0)
MCH: 23.7 pg — ABNORMAL LOW (ref 26.0–34.0)
MCHC: 31 g/dL (ref 30.0–36.0)
MCV: 76.6 fL — ABNORMAL LOW (ref 80.0–100.0)
Platelets: 343 10*3/uL (ref 150–400)
RBC: 2.78 MIL/uL — ABNORMAL LOW (ref 4.22–5.81)
RDW: 32.5 % — ABNORMAL HIGH (ref 11.5–15.5)
WBC: 18.3 10*3/uL — ABNORMAL HIGH (ref 4.0–10.5)
nRBC: 10.9 % — ABNORMAL HIGH (ref 0.0–0.2)

## 2020-07-27 LAB — GLUCOSE, CAPILLARY
Glucose-Capillary: 107 mg/dL — ABNORMAL HIGH (ref 70–99)
Glucose-Capillary: 115 mg/dL — ABNORMAL HIGH (ref 70–99)
Glucose-Capillary: 150 mg/dL — ABNORMAL HIGH (ref 70–99)
Glucose-Capillary: 154 mg/dL — ABNORMAL HIGH (ref 70–99)
Glucose-Capillary: 154 mg/dL — ABNORMAL HIGH (ref 70–99)
Glucose-Capillary: 191 mg/dL — ABNORMAL HIGH (ref 70–99)

## 2020-07-27 LAB — PREPARE RBC (CROSSMATCH)

## 2020-07-27 LAB — COMPREHENSIVE METABOLIC PANEL
ALT: 137 U/L — ABNORMAL HIGH (ref 0–44)
AST: 289 U/L — ABNORMAL HIGH (ref 15–41)
Albumin: 3.1 g/dL — ABNORMAL LOW (ref 3.5–5.0)
Alkaline Phosphatase: 439 U/L — ABNORMAL HIGH (ref 38–126)
Anion gap: 6 (ref 5–15)
BUN: 28 mg/dL — ABNORMAL HIGH (ref 8–23)
CO2: 27 mmol/L (ref 22–32)
Calcium: 8.6 mg/dL — ABNORMAL LOW (ref 8.9–10.3)
Chloride: 101 mmol/L (ref 98–111)
Creatinine, Ser: 0.33 mg/dL — ABNORMAL LOW (ref 0.61–1.24)
GFR, Estimated: >60 mL/min (ref >60)
Glucose, Bld: 179 mg/dL — ABNORMAL HIGH (ref 70–99)
Potassium: 4.2 mmol/L (ref 3.5–5.1)
Sodium: 134 mmol/L — ABNORMAL LOW (ref 135–145)
Total Bilirubin: 0.9 mg/dL (ref 0.3–1.2)
Total Protein: 7.8 g/dL (ref 6.5–8.1)

## 2020-07-27 LAB — PHOSPHORUS: Phosphorus: 3.1 mg/dL (ref 2.5–4.6)

## 2020-07-27 LAB — MAGNESIUM: Magnesium: 2.5 mg/dL — ABNORMAL HIGH (ref 1.7–2.4)

## 2020-07-27 MED ORDER — SODIUM CHLORIDE 0.9% IV SOLUTION: Freq: Once | INTRAVENOUS | Status: AC

## 2020-07-27 MED ORDER — HYDROCORTISONE NA SUCCINATE PF 100 MG IJ SOLR
50.0000 mg | Freq: Two times a day (BID) | INTRAMUSCULAR | Status: DC
  Administered 2020-07-28 – 2020-07-29 (×2): 50 mg via INTRAVENOUS
  Filled 2020-07-27 (×2): qty 2

## 2020-07-27 MED ORDER — HYDROCORTISONE NA SUCCINATE PF 100 MG IJ SOLR
50.0000 mg | Freq: Four times a day (QID) | INTRAMUSCULAR | Status: AC
  Administered 2020-07-27 – 2020-07-28 (×3): 50 mg via INTRAVENOUS
  Filled 2020-07-27 (×3): qty 2

## 2020-07-27 NOTE — Progress Notes (Signed)
[facility] Progress Note Patient Name: Luis Orr DOB: [birth year] MRN: [id number]   Date of Service  07/27/2020  HPI/Events of Note  Hemoglobin 6.6 gm / dl.  eICU Interventions  One unit PRBC ordered transfused.        Kerry Kass Arlene Brickel 07/27/2020, 4:51 AM

## 2020-07-27 NOTE — Progress Notes (Addendum)
[facility] Infectious Diseases Follow Up Note  Patient Identification: Patient Name: Luis Orr MRN: [id number] [place] Date: 06/28/2020  6:13 PM Age: 70 y.o.Today's Date: 07/27/2020   Reason for Visit: shock  Active Problems:   Septic shock ([place])   Pressure injury of skin   Acute respiratory failure with hypoxia (HCC)   AKI (acute kidney injury) ([place])   Hypotension   Antibiotics: Vancomycin 6/29-6/30, Vancomycin 7/12-current                    Meropenem 6/29-7/2                    Unasyn 7/2-current   Lines/Tubes: RT IJ HD catheter , LLQ colostomy, Urethral catheter , tracheostomy, PIVs    Assessment 31 Y O male from [facility] with hx  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 Afebrile on presentation but had leukocytosis of 23, requiring pressors, possibly in the setting of HAP. Resp cx 6/30 growing Amp S Acinetobacter baumannii   Shock: Possible distributive, lesslikely septic  -last episode of hypothermia was 7/11, temperature WNL  -Leukocytosis is also improving 40>36>27>18.3, procalcitonin downtrending  -Off pressors  -Blood cultures 7/12 NG in 3 days  -Ctchest /abd/pelvis findings - with improved findings of PNA  Acute anemia  Abnormal LFTs/Liver Cirrhosis: RUQ [place] with stones but no biliary dilatation  ( however limited study). Acute hepatitis panel and HIV NR. 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 Please call with questions.   Rest of the management as per the primary team. Thank you for the consult. Please page with pertinent questions or concerns. _____________________________________________________________________ Subjective patient seen and examined at the bedside. Per RN, patient has been off pressors since last night.   Vitals BP (!) 98/54   Pulse 60   Temp (!) 97.5 F (36.4 C) (Axillary)   Resp (!) 30   Ht '5\' 6"'$  (1.676 m) Comment: Simultaneous filing. User may not have seen previous data.  Wt 80 kg   SpO2 99%   BMI 28.47 kg/m     Physical Exam Constitutional:  lying in bed, off pressors     Comments:   Cardiovascular:     Rate and Rhythm: Normal rate and regular rhythm.     Heart sounds:   Pulmonary:     Effort: Trach to vent     Comments:   Abdominal:     Palpations: Abdomen is soft.     Tenderness: LLQ colostomy with liquid stool   Musculoskeletal:        General: No swelling or tenderness.   Skin:    Comments: No obvious lesions or rashes   Neurological:     General: unable to assess    Psychiatric:        Mood and Affect:  unable to assess   Pertinent Microbiology Results for orders placed or performed during the hospital encounter of 06/13/2020  Urine culture     Status: Abnormal   Collection Time: 06/18/2020  6:24 PM   Specimen: Urine, Random  Result Value Ref Range Status   Specimen Description URINE, RANDOM  Final   Special Requests   Final    NONE Performed at [facility], [address], [place], [place] [address]    Culture MULTIPLE SPECIES PRESENT, SUGGEST RECOLLECTION (A)  Final   Report Status 07/13/2020 FINAL  Final  Culture, blood (routine x 2)     Status: Abnormal   Collection Time: 06/22/2020  6:42 PM   Specimen: BLOOD LEFT FOREARM  Result Value Ref Range  Status   Specimen Description BLOOD LEFT FOREARM  Final   Special Requests   Final    BOTTLES DRAWN AEROBIC AND ANAEROBIC Blood Culture results may not be optimal due to an inadequate volume of blood received in culture bottles   Culture  Setup Time   Final    ANAEROBIC BOTTLE ONLY GRAM POSITIVE COCCI CRITICAL RESULT CALLED TO, READ BACK BY AND VERIFIED WITH: G ABBOTT PHARMD 07/13/20 0207 JDW    Culture (A)  Final    STAPHYLOCOCCUS AURICULARIS THE SIGNIFICANCE OF ISOLATING THIS ORGANISM FROM A SINGLE SET OF BLOOD CULTURES WHEN MULTIPLE SETS ARE DRAWN IS UNCERTAIN. PLEASE NOTIFY THE MICROBIOLOGY DEPARTMENT WITHIN ONE WEEK IF SPECIATION AND SENSITIVITIES ARE REQUIRED. Performed at [facility], [place] [address], [place], [place] [address]    Report Status 07/14/2020 FINAL  Final  Blood Culture ID Panel (Reflexed)     Status: Abnormal   Collection Time: 06/14/2020  6:42 PM  Result Value Ref Range Status   Enterococcus faecalis NOT DETECTED NOT DETECTED Final   Enterococcus Faecium NOT DETECTED NOT DETECTED Final   Listeria monocytogenes NOT DETECTED NOT DETECTED Final   Staphylococcus species DETECTED (A) NOT DETECTED Final    Comment: CRITICAL RESULT CALLED TO, READ BACK BY AND VERIFIED WITH: G ABBOTT PHARMD 07/13/20 0207 JDW    Staphylococcus aureus (BCID) NOT DETECTED NOT DETECTED Final   Staphylococcus epidermidis NOT DETECTED NOT DETECTED Final   Staphylococcus lugdunensis NOT DETECTED NOT DETECTED Final   Streptococcus species NOT DETECTED NOT DETECTED Final   Streptococcus agalactiae NOT DETECTED NOT DETECTED Final   Streptococcus pneumoniae NOT DETECTED NOT DETECTED Final   Streptococcus pyogenes NOT DETECTED NOT DETECTED Final   A.calcoaceticus-baumannii NOT DETECTED NOT DETECTED Final   Bacteroides fragilis NOT DETECTED NOT DETECTED Final   Enterobacterales NOT DETECTED NOT DETECTED Final   Enterobacter cloacae complex NOT DETECTED NOT DETECTED Final   Escherichia coli NOT  DETECTED NOT DETECTED Final   Klebsiella aerogenes NOT DETECTED NOT DETECTED Final   Klebsiella oxytoca NOT DETECTED NOT DETECTED Final   Klebsiella pneumoniae NOT DETECTED NOT DETECTED Final   Proteus species NOT DETECTED NOT DETECTED Final   Salmonella species NOT DETECTED NOT DETECTED Final   Serratia marcescens NOT DETECTED NOT DETECTED Final   Haemophilus influenzae NOT DETECTED NOT DETECTED Final   Neisseria meningitidis NOT DETECTED NOT DETECTED Final   Pseudomonas aeruginosa NOT DETECTED NOT DETECTED Final   Stenotrophomonas maltophilia NOT DETECTED NOT DETECTED Final   Candida albicans NOT DETECTED NOT DETECTED Final   Candida auris NOT DETECTED NOT DETECTED Final   Candida glabrata NOT DETECTED NOT DETECTED Final   Candida krusei NOT DETECTED NOT DETECTED Final   Candida parapsilosis NOT DETECTED  NOT DETECTED Final   Candida tropicalis NOT DETECTED NOT DETECTED Final   Cryptococcus neoformans/gattii NOT DETECTED NOT DETECTED Final    Comment: Performed at [facility], [address] [place], [place] [address]  Resp Panel by RT-PCR (Flu A&B, Covid) Nasopharyngeal Swab     Status: None   Collection Time: 07/07/2020  7:22 PM   Specimen: Nasopharyngeal Swab; Nasopharyngeal(NP) swabs in vial transport medium  Result Value Ref Range Status   SARS Coronavirus 2 by RT PCR NEGATIVE NEGATIVE Final    Comment: (NOTE) SARS-CoV-2 target nucleic acids are NOT DETECTED.  The SARS-CoV-2 RNA is generally detectable in upper respiratory specimens during the acute phase of infection. The lowest concentration of SARS-CoV-2 viral copies this assay can detect is 138 copies/mL. A negative result does not preclude SARS-Cov-2 infection and should not be used as the sole basis for treatment or other patient management decisions. A negative result may occur with  improper specimen collection/handling, submission of specimen other than nasopharyngeal swab, presence of viral mutation(s) within  the areas targeted by this assay, and inadequate number of viral copies(<138 copies/mL). A negative result must be combined with clinical observations, patient history, and epidemiological information. The expected result is Negative.  Fact Sheet for Patients:  EntrepreneurPulse.com.au  Fact Sheet for Healthcare Providers:  IncredibleEmployment.be  This test is no t yet approved or cleared by the [place] FDA and  has been authorized for detection and/or diagnosis of SARS-CoV-2 by FDA under an Emergency Use Authorization (EUA). This EUA will remain  in effect (meaning this test can be used) for the duration of the COVID-19 declaration under Section 564(b)(1) of the Act, 21 U.S.C.section 360bbb-3(b)(1), unless the authorization is terminated  or revoked sooner.       Influenza A by PCR NEGATIVE NEGATIVE Final   Influenza B by PCR NEGATIVE NEGATIVE Final    Comment: (NOTE) The Xpert Xpress SARS-CoV-2/FLU/RSV plus assay is intended as an aid in the diagnosis of influenza from Nasopharyngeal swab specimens and should not be used as a sole basis for treatment. Nasal washings and aspirates are unacceptable for Xpert Xpress SARS-CoV-2/FLU/RSV testing.  Fact Sheet for Patients: EntrepreneurPulse.com.au  Fact Sheet for Healthcare Providers: IncredibleEmployment.be  This test is not yet approved or cleared by the [place] FDA and has been authorized for detection and/or diagnosis of SARS-CoV-2 by FDA under an Emergency Use Authorization (EUA). This EUA will remain in effect (meaning this test can be used) for the duration of the COVID-19 declaration under Section 564(b)(1) of the Act, 21 U.S.C. section 360bbb-3(b)(1), unless the authorization is terminated or revoked.  Performed at [facility], [place] [address], [place], [place] [address]   Culture, blood (routine x 2)     Status: None    Collection Time: 06/15/2020  7:23 PM   Specimen: BLOOD  Result Value Ref Range Status   Specimen Description BLOOD RIGHT ANTECUBITAL  Final   Special Requests   Final    BOTTLES DRAWN AEROBIC AND ANAEROBIC Blood Culture adequate volume   Culture   Final    NO GROWTH 5 DAYS Performed at [facility], [place] [address] [place], [place] [address]    Report Status 07/16/2020 FINAL  Final  MRSA Next Gen by PCR, Nasal     Status: Abnormal   Collection Time: 07/06/2020 10:29 PM   Specimen: Nasal Mucosa; Nasal Swab  Result Value Ref Range Status   MRSA by PCR Next Gen DETECTED (A)  NOT DETECTED Final    Comment: RESULT CALLED TO, READ BACK BY AND VERIFIED WITH: Ayesha Rumpf RN 07/12/20 0428 JDW (NOTE) The GeneXpert MRSA Assay (FDA approved for NASAL specimens only), is one component of a comprehensive MRSA colonization surveillance program. It is not intended to diagnose MRSA infection nor to guide or monitor treatment for MRSA infections. Test performance is not FDA approved in patients less than 23 years old. Performed at [facility], [address] [place], [place] [address]   Culture, Respiratory w Gram Stain     Status: None   Collection Time: 07/12/20  3:48 PM   Specimen: Tracheal Aspirate; Respiratory  Result Value Ref Range Status   Specimen Description TRACHEAL ASPIRATE  Final   Special Requests NONE  Final   Gram Stain   Final    MODERATE WBC PRESENT, PREDOMINANTLY PMN FEW GRAM NEGATIVE RODS FEW GRAM POSITIVE COCCI IN PAIRS    Culture   Final    MODERATE ACINETOBACTER BAUMANNII MULTI-DRUG RESISTANT ORGANISM RESULT CALLED TO, READ BACK BY AND VERIFIED WITH: RN J.BEABRAUT ON [id number] AT 1410 BY E.PARRISH Performed at [facility], [place] [address], [place], [place] [address]    Report Status 07/14/2020 FINAL  Final   Organism ID, Bacteria ACINETOBACTER BAUMANNII  Final      Susceptibility   Acinetobacter baumannii - MIC*    CEFTAZIDIME 32 RESISTANT Resistant      CIPROFLOXACIN >=4 RESISTANT Resistant     GENTAMICIN >=16 RESISTANT Resistant     IMIPENEM >=16 RESISTANT Resistant     PIP/TAZO >=128 RESISTANT Resistant     TRIMETH/SULFA >=320 RESISTANT Resistant     AMPICILLIN/SULBACTAM 8 SENSITIVE Sensitive     * MODERATE ACINETOBACTER BAUMANNII  Culture, Urine     Status: Abnormal   Collection Time: 07/13/20 11:26 AM   Specimen: Urine, Random  Result Value Ref Range Status   Specimen Description URINE, RANDOM  Final   Special Requests   Final    NONE Performed at [facility], [place] [address], [place], [place] [address]    Culture MULTIPLE SPECIES PRESENT, SUGGEST RECOLLECTION (A)  Final   Report Status 07/14/2020 FINAL  Final  Culture, blood (routine x 2)     Status: None (Preliminary result)   Collection Time: 07/24/20  3:53 PM   Specimen: BLOOD RIGHT HAND  Result Value Ref Range Status   Specimen Description BLOOD RIGHT HAND  Final   Special Requests   Final    BOTTLES DRAWN AEROBIC AND ANAEROBIC Blood Culture results may not be optimal due to an inadequate volume of blood received in culture bottles   Culture   Final    NO GROWTH 2 DAYS Performed at [facility], [place] [address], [place], [place] [address]    Report Status PENDING  Incomplete  Culture, blood (routine x 2)     Status: None (Preliminary result)   Collection Time: 07/24/20  4:10 PM   Specimen: BLOOD LEFT HAND  Result Value Ref Range Status   Specimen Description BLOOD LEFT HAND  Final   Special Requests   Final    BOTTLES DRAWN AEROBIC AND ANAEROBIC Blood Culture adequate volume   Culture   Final    NO GROWTH 2 DAYS Performed at [facility], [place] [address], [place], [place] [address]    Report Status PENDING  Incomplete      Pertinent Lab. CBC Latest Ref Rng & Units 07/27/2020 07/26/2020 07/26/2020  WBC 4.0 - 10.5 K/uL 18.3(H) - 27.3(H)  Hemoglobin 13.0 - 17.0 g/dL 6.6(LL) 8.8(L) 7.7(L)  Hematocrit 39.0 - 52.0 % 21.3(L) 26.0(L) 25.2(L)   Platelets 150 - 400 K/uL 343 - 409(H)   CMP Latest Ref Rng & Units 07/27/2020 07/26/2020 07/26/2020  Glucose 70 - 99 mg/dL 179(H) - 129(H)  BUN 8 - 23 mg/dL 28(H) - 22  Creatinine 0.61 - 1.24 mg/dL 0.33(L) - 0.33(L)  Sodium 135 - 145 mmol/L 134(L) 140 134(L)  Potassium 3.5 - 5.1 mmol/L 4.2 4.4 4.4  Chloride 98 - 111 mmol/L 101 - 103  CO2 22 - 32 mmol/L 27 - 28  Calcium 8.9 - 10.3 mg/dL 8.6(L) - 8.3(L)  Total Protein 6.5 - 8.1 g/dL 7.8 - -  Total Bilirubin 0.3 - 1.2 mg/dL 0.9 - -  Alkaline Phos 38 - 126 U/L 439(H) - -  AST 15 - 41 U/L 289(H) - -  ALT 0 - 44 U/L 137(H) - -    Pertinent Imaging today Plain films and CT images have been personally visualized and interpreted; radiology reports have been reviewed. Decision making incorporated into the Impression / Recommendations  Chest Xray 07/27/20 FINDINGS: Tracheostomy tube tip projects at the tracheal air column, just inferior to the clavicular heads, similar to prior. Similar prominent tracheostomy cuff. Right IJ central venous catheter with the tip projecting expected region of the SVC, similar to prior. No substantial change in appearance of layering bilateral pleural effusions with bibasilar consolidation. No visible pneumothorax on this single semi erect AP radiograph. Similar cardiomediastinal silhouette, partially obscured.   IMPRESSION: 1. Similar bilateral layering pleural effusions with bibasilar consolidation, better characterized on CT from yesterday. 2. Similar positioning of support devices, detailed above. Similar prominent tracheostomy cuff, potentially overinflated.      I spent more than 35 minutes for this patient encounter including review of prior medical records, coordination of care  with greater than 50% of time being face to face/counseling and discussing diagnostics/treatment plan with the patient/family.  Electronically signed by:   Rosiland Oz, MD Infectious Disease Physician [facility] Pager: [phone]

## 2020-07-27 NOTE — Progress Notes (Signed)
Pt transported on vent. to CT and back to 3M16 without any complications. RN at bedside, RT will continue to monitor.

## 2020-07-27 NOTE — Progress Notes (Signed)
NAME:  Luis Orr, MRN:  [id number], DOB:  [birth year], LOS: 57 ADMISSION DATE:  07/07/2020, CONSULTATION DATE:  07/05/2020 REFERRING MD:  Dr Francia Greaves, EDP CHIEF COMPLAINT:  septic shock    History of Present Illness:  70 yo male from [facility] with hx of C3-C5 compressive myelopathy with functional quadriplegia and chronic trach/vent presented to ED with altered mental status and hypotension with recurrent HCAP.  Has multiple recent admissions for sepsis secondary to various infections including UTIs, pneumonias and bacteremia in setting of MDR Klebsiella, MRSA and pseudomonas. Most recent admission September 2021 in setting of sepsis due to MDR Klebsiella with right ureteral calculus s/p double J ureteral stent placement and treated with meropenem x 10days.  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 Central line placed. Drainage from gastrostomy tube >> CT Abd without fluid collection around gastrostomy tube but with diffuse body wall edema. Echo EF 50-55%, mild LVH  7/6 Renal Replacement Therapy ; Code status changed to partial Code (DNR) 7/7 Transfuse 1U PRBC 7/9 add solu cortef 7/10 off pressors, weaning stress steroids  7/11 Pressors added back on again 7/12 ID consult due to rising WBC, hypothermia, hypotension  concern for worsening infection. Vancomycin added. Repeat blood cx collected.  Interim History / Subjective:   On minimal vent settings Weaned off levophed  Objective   Blood pressure (!) 98/54, pulse 60, temperature (!) 97.5 F (36.4 C), temperature source Axillary, resp. rate (!) 30, height '5\' 6"'$  (1.676 m), weight 80 kg, SpO2 99 %.    Vent Mode: PRVC FiO2 (%):  [30 %] 30 % Set Rate:  [22 bmp-30 bmp] 30 bmp Vt Set:  [380 mL] 380 mL PEEP:  [5 cmH20] 5 cmH20 Plateau Pressure:  [20 cmH20-27 cmH20] 20 cmH20   Intake/Output Summary (Last 24 hours) at 07/27/2020 0713 Last data filed at 07/27/2020 0700 Gross per 24 hour  Intake 2504.03 ml  Output 2335 ml  Net 169.03 ml   Filed Weights   07/25/20 0500 07/26/20 0446 07/27/20 0500  Weight: 79.5 kg 78.9 kg 80 kg   Physical Exam: General: Chronically ill-appearing, no acute distress HENT: [place], AT, OP clear, MMM, trach in place, c/d/i Eyes: EOMI, no scleral icterus Respiratory: Clear to auscultation bilaterally.  No crackles, wheezing or rales Cardiovascular: RRR, -M/R/G, no JVD GI: BS+, soft, nontender, PEG in place Extremities:Anasarca,-tenderness Neuro: Intermittently opens eyes to voice, does not follow commands, quadriplegic  Pertinent Labs Na 134 K 4.2 CO2 27 BUN/Cr 28/0.33 WBC 18.3 improving Hg 6.6  AP 439 AST 289 ALT 137, slightly improving LFTs  CXR 07/27/20 - Improved airspace opacities compared to 07/18/20.   CT A/P 07/26/20 - Bibasilar consolidation. Bilateral tree in bud. Improved consolidation and ascites compared to 07/17/20  Bcx 07/24/20 NGTD x 2d  Resolved Hospital Problem list   Hyperkalemia Tachycardia Hypotension Thrombocytosis  AKI  Assessment &  Plan:  Septic shock 2nd to HCAP and UTI all present on admission.:  Acinetobacter in sputum culture from 07/12/20. Low-normal blood pressures P: -Weaned off levo. Restart if needed for goal MAP >65 -continue steroids and midodrine -continue antibiotics: unasyn  and vanc -follow blood cultures -f/u pathology smear -trend CBC/fever  Acute on chronic hypoxic and hypercarbic respiratory failure secondary pna and volume overload Tracheostomy and ventilator dependent status. ABG with improving respiratory acidosis P: -continue full vent support. On minimal vent settings -trach care and VAP prevention in place -pulmonary hygiene -Sat goal >92% -Continue CRRT for volume removal  Transaminitis, likely 2/2 shock liver: Differential includes viral hepatitis, drug-induced, hepatic ischemia from shock, adrenal insufficiency. Previous CT on 7/5 showed no focal liver abnormality but noted possible sludge or stone in gallbladder without intra or extra-hepatic biliary dilation.  Improving P: -supportive care  Hypothermia P: -Continue steroids -on broad spectrum abx -Continue bair-hugger -trend CBC/fever  DM II poorly controlled with hyperglycemia. CBG improved with only one reading >200 in last 24 hours.  P: -Glucose range 145-206; likely elevated with steroid use; consider increasing tomorrow if persistently high -Continue Lantus 20U BID -Continue tube feed coverage -Continue resistant SSI and CBG monitoring  Anasarca P: -continue CRRT per nephrology. Poor long term candidate for dialysis  Anemia of critical illness, chronic disease and iron deficiency: s/p 1U PRBC transfusion on 7/02 and 7/07 and 7/15; holding iron supplementation in setting of infection No s/sx active bleed P: -transfuse for hgb <7. One unit ordered this AM -trend cbc -f/u post transfusion CBC  Severe protein calorie malnutrition (POA) P: - continue tube feeds  Paroxysmal atrial fibrillation. P: -Continue amiodarone and Apixiban  Sacral decubitus ulcer stage IV (POA) P: - wound care  Acute metabolic encephalopathy  Hypoactive delirium Depression P: -continue Wellbutrin and sertraline  Goals of Care P: - continue medical therapy - DNR in event of cardiac  arrest  Best Practice (right click and "Reselect all SmartList Selections" daily)   Diet/type: tubefeeds DVT prophylaxis: prophylactic heparin  GI prophylaxis: PPI Lines: Dialysis Catheter and yes and it is still needed Foley:  Yes, and it is still needed. Chronic foley due to quadriplegia. Exchanged 7/1 Code Status:  limited Disposition: likely back to [facility] once he is off CRRT and tolerating HD  Labs:   CMP Latest Ref Rng & Units 07/27/2020 07/26/2020 07/26/2020  Glucose 70 - 99 mg/dL 179(H) - 129(H)  BUN 8 - 23 mg/dL 28(H) - 22  Creatinine 0.61 - 1.24 mg/dL 0.33(L) - 0.33(L)  Sodium 135 - 145 mmol/L 134(L) 140 134(L)  Potassium 3.5 - 5.1 mmol/L 4.2 4.4 4.4  Chloride 98 - 111 mmol/L 101 - 103  CO2 22 - 32 mmol/L 27 - 28  Calcium 8.9 - 10.3 mg/dL 8.6(L) - 8.3(L)  Total Protein 6.5 - 8.1 g/dL 7.8 - -  Total Bilirubin 0.3 - 1.2 mg/dL 0.9 - -  Alkaline Phos 38 - 126 U/L 439(H) - -  AST 15 - 41 U/L 289(H) - -  ALT 0 - 44 U/L 137(H) - -    CBC Latest Ref Rng & Units 07/27/2020 07/26/2020 07/26/2020  WBC 4.0 - 10.5 K/uL 18.3(H) - 27.3(H)  Hemoglobin 13.0 - 17.0 g/dL 6.6(LL) 8.8(L) 7.7(L)  Hematocrit 39.0 - 52.0 % 21.3(L) 26.0(L) 25.2(L)  Platelets 150 - 400 K/uL 343 - 409(H)    ABG    Component Value Date/Time   PHART 7.174 (LL) 07/26/2020 1753   PCO2ART 81.1 ([place]) 07/26/2020 1753  PO2ART 75 (L) 07/26/2020 1753   HCO3 30.1 (H) 07/26/2020 1753   TCO2 33 (H) 07/26/2020 1753   O2SAT 90.0 07/26/2020 1753    CBG (last 3)  Recent Labs    07/26/20 1952 07/26/20 2350 07/27/20 0318  GLUCAP 183* 188* 191*    Signature: 45 minutes   The patient is critically ill with multiple organ systems failure and requires high complexity decision making for assessment and support, frequent evaluation and titration of therapies, application of advanced monitoring technologies and extensive interpretation of multiple databases.    Rodman Pickle, M.D. [facility] Pulmonary/Critical Care  Medicine 07/27/2020 7:13 AM   Please see Am[facility] for pager number to reach on-call Pulmonary and Critical Care Team.

## 2020-07-27 NOTE — Progress Notes (Signed)
[facility] Progress Note    Assessment/ Plan:   AKI -baseline Cr 0.4. AKI likely secondary to ATN in the context of shock. CRRT initiated on 7/6 for anasarca with ineffective diuresis and uremia. Appreciate assistance from CCM for rij temp line placement 7/6 -Not on an ideal long term candidate for HD due to poor functional status, vent dependency. Dr. Candiss Norse voiced this concern to both son and wife over the phone on 7/6 and they had wished to still proceed forward with renal replacement therapy. Appreciate palliative care's assistance.  I again discussed concern for long term dialysis to wife and son 7/12; they remain hopeful for renal recovery. Will update in the next day or so.  -BP much better on A line - ok to hold CRRT and reassess for ongoing HD needs.  Asked RN to try to find a location where cuff pressure correlates with A line.  -Avoid nephrotoxic medications including NSAIDs and iodinated intravenous contrast exposure unless the latter is absolutely indicated.  Preferred narcotic agents for pain control are hydromorphone, fentanyl, and methadone. Morphine should not be used. Avoid Baclofen and avoid oral sodium phosphate and magnesium citrate based laxatives / bowel preps. Continue strict Input and Output monitoring. Will monitor the patient closely with you and intervene or adjust therapy as indicated by changes in clinical status/labs   Anasarca: improved, volume is mostly extravascular and trying to prevent ongoing AKI so run net even today  CKD-MBD, hyperphosphatemia -managing with crrt, continue to monitor phos  Acute metabolic encephalopathy, likely related to sepsis and uremia: improving -crrt as above, apparently alert and oriented at baseline.  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 Avoid iron for now in light of active infection - in light of off abx as of today can consider iron load given iron deficient on 6/30 labs (t sat 5%) -hgb 8.4 > 7.7 > 6.6 transfusion 10.1  Dispo:  with comorbids not a good long term dialysis candidate.  Family wish to continue current care.  Next step will be to see if he tolerates iHD.   Subjective:   Seen and examined on CRRT.  A line was inserted and reading much higher than cuff - pressors off.  Fio2 30%.Remains anuric.  Net +0.1L yesterday with CRRT.    Objective:   BP (!) 115/56   Pulse 60   Temp (!) 97.4 F (36.3 C) (Oral)   Resp (!) 30   Ht 5' 6"  (1.676 m)   Wt 80 kg   SpO2 100%   BMI 28.47 kg/m   Intake/Output Summary (Last 24 hours) at 07/27/2020 1157 Last data filed at 07/27/2020 0347 Gross per 24 hour  Intake 2921.44 ml  Output 2452 ml  Net 469.44 ml    Weight change: 1.1 kg  Physical Exam: QQV:[id number] ill appearing CVS:s1s2, rrr Resp:trach, bl chest expansion IRJ:JOAC Ext:1+ diffuse edema Neuro: quadriplegic, opening eyes   Imaging: CT ABDOMEN PELVIS WO CONTRAST  Result Date: 07/26/2020 CLINICAL DATA:  70 year old male with sepsis. Fever. Pleural effusion. EXAM: CT CHEST, ABDOMEN AND PELVIS WITHOUT CONTRAST TECHNIQUE: Multidetector CT imaging of the chest, abdomen and pelvis was performed following the standard protocol without IV contrast. COMPARISON:  CT Abdomen and Pelvis 07/17/2020. Chest radiographs 07/24/2020 and earlier. FINDINGS: CT CHEST  FINDINGS Cardiovascular: Stable cardiac size at the upper limits of normal. No pericardial effusion. Mild Calcified aortic atherosclerosis. Right IJ approach dual lumen catheter. Calcified coronary artery atherosclerosis on series 3, image 34. Mediastinum/Nodes: Negative. No mediastinal mass or lymphadenopathy. Lungs/Pleura: Tracheostomy in place with probable overinflation  of the cuff (series 4, image 25). The major airways are patent. There is widespread tree-in-bud nodularity throughout both lungs with superimposed bilateral lower lobe bronchiectasis and consolidation with air bronchograms. Compared to 07/17/2020, ventilation in the bilateral middle lobes has improved. Small dependent pleural effusions have not significantly changed since that time. In the left cardiophrenic angle there is subpleural emphysema or lung scarring with early honeycombing. Musculoskeletal: Lower thoracic spine ankylosis related to both loss of disc spaces and endplate osteophytes. No acute or suspicious osseous abnormality identified in the chest. CT ABDOMEN PELVIS FINDINGS Hepatobiliary: Small volume perihepatic ascites has decreased. Evidence of gallbladder sludge. But no pericholecystic inflammation. No evidence of bile duct enlargement. Pancreas: Partially atrophied, otherwise negative. Spleen: Negative. Adrenals/Urinary Tract: Normal adrenal glands. Atrophied right kidney with double-J ureteral stent in place. Left kidney with large nearly 9 cm partially exophytic but simple fluid density cyst. Smaller left upper pole and midpole cysts. Multifocal left nephrolithiasis appears stable. No left hydronephrosis. Decompressed left ureter to the bladder. Bladder is decompressed by Foley catheter. Stomach/Bowel: Stable and negative rectum, blind-ending sigmoid colon with staple line on series 3, image 97. Descending colostomy with no adverse features. Much of the large bowel is decompressed. Cecum is on a lax mesentery. Terminal ileum is decompressed. No dilated small bowel. Normal appendix which terminates on coronal image 49. Gastrostomy tube with no adverse features. Negative duodenum. No free air. Vascular/Lymphatic: Aortoiliac calcified atherosclerosis. Normal caliber abdominal aorta. No lymphadenopathy. Reproductive: Urethral catheter in place with no adverse features. Other: Regressed body wall edema  since 07/17/2020. No pelvic free fluid. Musculoskeletal: Interbody ankylosis at L2-L3 has an appearance suggestive of healed discitis osteomyelitis. Stable visualized osseous structures. From the recent CT abdomen. No acute osseous abnormality identified. IMPRESSION: 1. Tracheostomy in place, cuff may be overinflated. 2. Widespread tree-in-bud nodularity throughout both lungs compatible with infective bronchiolitis (bacterial, viral, or fungal). Continued extensive lower lobe bronchiectasis and consolidation. However, middle lobe consolidation has regressed since 07/17/2020. Small pleural effusions not significantly changed since that time. 3. Regression of anasarca and small volume ascites since 07/17/2020. 4. No other acute or inflammatory process identified in the chest, abdomen, or pelvis. Chronically atrophic right kidney with double-J ureteral stent in place. Left nephrolithiasis without obstruction on that side. Bladder decompressed by Foley catheter. 5. Aortic Atherosclerosis (ICD10-I70.0). Electronically Signed   By: Genevie Ann M.D.   On: 07/26/2020 11:41   CT CHEST WO CONTRAST  Result Date: 07/26/2020 CLINICAL DATA:  70 year old male with sepsis. Fever. Pleural effusion. EXAM: CT CHEST, ABDOMEN AND PELVIS WITHOUT CONTRAST TECHNIQUE: Multidetector CT imaging of the chest, abdomen and pelvis was performed following the standard protocol without IV contrast. COMPARISON:  CT Abdomen and Pelvis 07/17/2020. Chest radiographs 07/24/2020 and earlier. FINDINGS: CT CHEST FINDINGS Cardiovascular: Stable cardiac size at the upper limits of normal. No pericardial effusion. Mild Calcified aortic atherosclerosis. Right IJ approach dual lumen catheter. Calcified coronary artery atherosclerosis on series 3, image 34. Mediastinum/Nodes: Negative. No mediastinal mass or lymphadenopathy. Lungs/Pleura: Tracheostomy in place with probable overinflation of the cuff (series 4, image 25). The major airways are patent. There is  widespread tree-in-bud nodularity throughout both lungs with superimposed bilateral lower lobe bronchiectasis and consolidation with air  bronchograms. Compared to 07/17/2020, ventilation in the bilateral middle lobes has improved. Small dependent pleural effusions have not significantly changed since that time. In the left cardiophrenic angle there is subpleural emphysema or lung scarring with early honeycombing. Musculoskeletal: Lower thoracic spine ankylosis related to both loss of disc spaces and endplate osteophytes. No acute or suspicious osseous abnormality identified in the chest. CT ABDOMEN PELVIS FINDINGS Hepatobiliary: Small volume perihepatic ascites has decreased. Evidence of gallbladder sludge. But no pericholecystic inflammation. No evidence of bile duct enlargement. Pancreas: Partially atrophied, otherwise negative. Spleen: Negative. Adrenals/Urinary Tract: Normal adrenal glands. Atrophied right kidney with double-J ureteral stent in place. Left kidney with large nearly 9 cm partially exophytic but simple fluid density cyst. Smaller left upper pole and midpole cysts. Multifocal left nephrolithiasis appears stable. No left hydronephrosis. Decompressed left ureter to the bladder. Bladder is decompressed by Foley catheter. Stomach/Bowel: Stable and negative rectum, blind-ending sigmoid colon with staple line on series 3, image 97. Descending colostomy with no adverse features. Much of the large bowel is decompressed. Cecum is on a lax mesentery. Terminal ileum is decompressed. No dilated small bowel. Normal appendix which terminates on coronal image 49. Gastrostomy tube with no adverse features. Negative duodenum. No free air. Vascular/Lymphatic: Aortoiliac calcified atherosclerosis. Normal caliber abdominal aorta. No lymphadenopathy. Reproductive: Urethral catheter in place with no adverse features. Other: Regressed body wall edema since 07/17/2020. No pelvic free fluid. Musculoskeletal: Interbody  ankylosis at L2-L3 has an appearance suggestive of healed discitis osteomyelitis. Stable visualized osseous structures. From the recent CT abdomen. No acute osseous abnormality identified. IMPRESSION: 1. Tracheostomy in place, cuff may be overinflated. 2. Widespread tree-in-bud nodularity throughout both lungs compatible with infective bronchiolitis (bacterial, viral, or fungal). Continued extensive lower lobe bronchiectasis and consolidation. However, middle lobe consolidation has regressed since 07/17/2020. Small pleural effusions not significantly changed since that time. 3. Regression of anasarca and small volume ascites since 07/17/2020. 4. No other acute or inflammatory process identified in the chest, abdomen, or pelvis. Chronically atrophic right kidney with double-J ureteral stent in place. Left nephrolithiasis without obstruction on that side. Bladder decompressed by Foley catheter. 5. Aortic Atherosclerosis (ICD10-I70.0). Electronically Signed   By: Genevie Ann M.D.   On: 07/26/2020 11:41   DG Chest Port 1 View  Result Date: 07/27/2020 CLINICAL DATA:  Acute respiratory failure with hypoxia. EXAM: PORTABLE CHEST 1 VIEW COMPARISON:  07/24/2020. FINDINGS: Tracheostomy tube tip projects at the tracheal air column, just inferior to the clavicular heads, similar to prior. Similar prominent tracheostomy cuff. Right IJ central venous catheter with the tip projecting expected region of the SVC, similar to prior. No substantial change in appearance of layering bilateral pleural effusions with bibasilar consolidation. No visible pneumothorax on this single semi erect AP radiograph. Similar cardiomediastinal silhouette, partially obscured. IMPRESSION: 1. Similar bilateral layering pleural effusions with bibasilar consolidation, better characterized on CT from yesterday. 2. Similar positioning of support devices, detailed above. Similar prominent tracheostomy cuff, potentially overinflated. Electronically Signed   By:  Margaretha Sheffield MD   On: 07/27/2020 08:03   US Abdomen Limited RUQ (LIVER/GB)  Result Date: 07/25/2020 CLINICAL DATA:  Transaminitis. History of hypertension, diabetes and chronic kidney disease. EXAM: ULTRASOUND ABDOMEN LIMITED RIGHT UPPER QUADRANT COMPARISON:  Renal ultrasound 07/18/2020.  Abdominal CT 07/17/2020. FINDINGS: Gallbladder: The gallbladder is difficult to confidently identify. There is shadowing in the subhepatic space which could reflect the presence of multiple gallstones or adjacent bowel gas. The gallbladder was present on recent CT with questionable stones. Unable  to visualize the gallbladder wall. Unable to assess for sonographic Murphy's sign due to patient unresponsiveness. Common bile duct: Diameter: 4 mm Liver: The liver is heterogeneous in echotexture with mild contour irregularity, suggesting cirrhosis. No focal lesions are identified. Portal vein is patent on color Doppler imaging with normal direction of blood flow towards the liver. Other: The previously identified ascites appears improved. There is a small right pleural effusion. Right renal atrophy and right renal cyst are noted incidentally. Study is technically difficult. IMPRESSION: 1. Limited study due to body habitus and reported patient unresponsiveness. 2. Echogenic shadowing structure in the right upper quadrant suspicious for gallbladder filled with stones (wall echo shadow sign). No evidence of biliary dilatation. 3. Heterogeneous hepatic parenchyma without demonstrated focal abnormality. Mild perihepatic ascites. Electronically Signed   By: Richardean Sale M.D.   On: 07/25/2020 17:21    Labs: BMET Recent Labs  Lab 07/24/20 1553 07/25/20 0319 07/25/20 1721 07/25/20 1955 07/26/20 0500 07/26/20 1516 07/26/20 1753 07/27/20 0354 07/27/20 0728  NA 136 134* 132* 133* 134* 134* 140 134* 141  K 4.9 4.6 6.0* 4.4 4.3 4.4 4.4 4.2 4.3  CL 104 102 104 102 101 103  --  101  --   CO2 28 27 23 28 28 28   --  27  --    GLUCOSE 167* 187* 127* 202* 206* 129*  --  179*  --   BUN 24* 25* 23 25* 23 22  --  28*  --   CREATININE 0.32* 0.31* 0.31* 0.31* 0.30* 0.33*  --  0.33*  --   CALCIUM 8.4* 8.2* 7.8* 8.1* 8.2* 8.3*  --  8.6*  --   PHOS 2.7 2.8 2.8 2.7 2.3* 2.8  --  3.1  --     CBC Recent Labs  Lab 07/24/20 1009 07/25/20 0319 07/26/20 0500 07/26/20 1753 07/27/20 0354 07/27/20 0728  WBC 40.6* 36.8* 27.3*  --  18.3*  --   NEUTROABS 36.3*  --   --   --   --   --   HGB 8.2* 8.4* 7.7* 8.8* 6.6* 10.5*  HCT 26.0* 26.7* 25.2* 26.0* 21.3* 31.0*  MCV 74.5* 76.7* 77.8*  --  76.6*  --   PLT 488* 500* 409*  --  343  --      Medications:     amiodarone  200 mg Per Tube Daily   apixaban  5 mg Per Tube BID   B-complex with vitamin C  1 tablet Per Tube Daily   buPROPion  75 mg Per Tube BID   chlorhexidine gluconate (MEDLINE KIT)  15 mL Mouth Rinse BID   Chlorhexidine Gluconate Cloth  6 each Topical Q1200   docusate  100 mg Per Tube BID   feeding supplement (PROSource TF)  90 mL Per Tube TID   guaiFENesin  5 mL Per Tube BID   hydrocortisone sod succinate (SOLU-CORTEF) inj  50 mg Intravenous Q6H   Followed by   Derrill Memo ON 07/28/2020] hydrocortisone sod succinate (SOLU-CORTEF) inj  50 mg Intravenous Q12H   insulin aspart  0-20 Units Subcutaneous Q4H   insulin aspart  6 Units Subcutaneous Q4H   insulin glargine  20 Units Subcutaneous BID   mouth rinse  15 mL Mouth Rinse 10 times per day   midodrine  15 mg Per Tube Q8H   pantoprazole sodium  40 mg Per Tube QHS   polyethylene glycol  17 g Per Tube Daily   sertraline  100 mg Per Tube Daily  sodium chloride flush  10-40 mL Intracatheter Q12H     Jannifer Hick MD [facility] Pager [phone]

## 2020-07-28 DIAGNOSIS — J9601 Acute respiratory failure with hypoxia: Secondary | ICD-10-CM | POA: Diagnosis not present

## 2020-07-28 LAB — TYPE AND SCREEN
ABO/RH(D): O POS
Antibody Screen: NEGATIVE
Unit division: 0

## 2020-07-28 LAB — RENAL FUNCTION PANEL
Albumin: 2.8 g/dL — ABNORMAL LOW (ref 3.5–5.0)
Albumin: 2.7 g/dL — ABNORMAL LOW (ref 3.5–5.0)
Albumin: 2.5 g/dL — ABNORMAL LOW (ref 3.5–5.0)
Anion gap: 9 (ref 5–15)
Anion gap: 10 (ref 5–15)
Anion gap: 8 (ref 5–15)
BUN: 60 mg/dL — ABNORMAL HIGH (ref 8–23)
BUN: 67 mg/dL — ABNORMAL HIGH (ref 8–23)
BUN: 108 mg/dL — ABNORMAL HIGH (ref 8–23)
CO2: 25 mmol/L (ref 22–32)
CO2: 24 mmol/L (ref 22–32)
CO2: 24 mmol/L (ref 22–32)
Calcium: 8.7 mg/dL — ABNORMAL LOW (ref 8.9–10.3)
Calcium: 8.7 mg/dL — ABNORMAL LOW (ref 8.9–10.3)
Calcium: 8.4 mg/dL — ABNORMAL LOW (ref 8.9–10.3)
Chloride: 102 mmol/L (ref 98–111)
Chloride: 103 mmol/L (ref 98–111)
Chloride: 103 mmol/L (ref 98–111)
Creatinine, Ser: 0.62 mg/dL (ref 0.61–1.24)
Creatinine, Ser: 0.69 mg/dL (ref 0.61–1.24)
Creatinine, Ser: 0.95 mg/dL (ref 0.61–1.24)
GFR, Estimated: >60 mL/min (ref >60)
GFR, Estimated: >60 mL/min (ref >60)
GFR, Estimated: >60 mL/min (ref >60)
Glucose, Bld: 123 mg/dL — ABNORMAL HIGH (ref 70–99)
Glucose, Bld: 110 mg/dL — ABNORMAL HIGH (ref 70–99)
Glucose, Bld: 178 mg/dL — ABNORMAL HIGH (ref 70–99)
Phosphorus: 6.5 mg/dL — ABNORMAL HIGH (ref 2.5–4.6)
Phosphorus: 6.8 mg/dL — ABNORMAL HIGH (ref 2.5–4.6)
Phosphorus: 8.5 mg/dL — ABNORMAL HIGH (ref 2.5–4.6)
Potassium: 5.5 mmol/L — ABNORMAL HIGH (ref 3.5–5.1)
Potassium: 5.5 mmol/L — ABNORMAL HIGH (ref 3.5–5.1)
Potassium: 6.1 mmol/L — ABNORMAL HIGH (ref 3.5–5.1)
Sodium: 136 mmol/L (ref 135–145)
Sodium: 137 mmol/L (ref 135–145)
Sodium: 135 mmol/L (ref 135–145)

## 2020-07-28 LAB — CBC
HCT: 24.6 % — ABNORMAL LOW (ref 39.0–52.0)
Hemoglobin: 8 g/dL — ABNORMAL LOW (ref 13.0–17.0)
MCH: 24.7 pg — ABNORMAL LOW (ref 26.0–34.0)
MCHC: 32.5 g/dL (ref 30.0–36.0)
MCV: 75.9 fL — ABNORMAL LOW (ref 80.0–100.0)
Platelets: 324 10*3/uL (ref 150–400)
RBC: 3.24 MIL/uL — ABNORMAL LOW (ref 4.22–5.81)
RDW: 31.2 % — ABNORMAL HIGH (ref 11.5–15.5)
WBC: 22.7 10*3/uL — ABNORMAL HIGH (ref 4.0–10.5)
nRBC: 9.3 % — ABNORMAL HIGH (ref 0.0–0.2)

## 2020-07-28 LAB — MAGNESIUM: Magnesium: 2.6 mg/dL — ABNORMAL HIGH (ref 1.7–2.4)

## 2020-07-28 LAB — BPAM RBC
Blood Product Expiration Date: 202208062359
ISSUE DATE / TIME: 202207150612
Unit Type and Rh: 5100

## 2020-07-28 LAB — GLUCOSE, CAPILLARY
Glucose-Capillary: 113 mg/dL — ABNORMAL HIGH (ref 70–99)
Glucose-Capillary: 95 mg/dL (ref 70–99)
Glucose-Capillary: 125 mg/dL — ABNORMAL HIGH (ref 70–99)
Glucose-Capillary: 176 mg/dL — ABNORMAL HIGH (ref 70–99)
Glucose-Capillary: 152 mg/dL — ABNORMAL HIGH (ref 70–99)

## 2020-07-28 LAB — PROCALCITONIN: Procalcitonin: 0.98 ng/mL

## 2020-07-28 NOTE — Progress Notes (Signed)
[facility] Progress Note    Assessment/ Plan:   AKI -baseline Cr 0.4. AKI likely secondary to ATN in the context of shock. CRRT initiated on 7/6 for anasarca with ineffective diuresis and uremia. Appreciate assistance from CCM for rij temp line placement 7/6 - CRRT 7/6 - 7/15, now off due to improved hemodynamics -Not on an ideal long term candidate for HD due to poor functional status, vent dependency. Dr. Candiss Norse voiced this concern to both son and wife over the phone on 7/6 and they had wished to still proceed forward with renal replacement therapy. Appreciate palliative care's assistance.  I again discussed concern for long term dialysis to wife and son 7/12; they remain hopeful for renal recovery. Called wife today to update but no answer, will try again tomorrow.  I think giving it another week to see if he recovers now that he's out of shock/acute phase is reasonable but if no improvement would recommend no long term dialysis.  -Avoid nephrotoxic medications including NSAIDs and iodinated intravenous contrast exposure unless the latter is absolutely indicated.  Preferred narcotic agents for pain control are hydromorphone, fentanyl, and methadone. Morphine should not be used. Avoid Baclofen and avoid oral sodium phosphate and magnesium citrate based laxatives / bowel preps. Continue strict Input and Output monitoring. Will monitor the patient closely with you and intervene or adjust therapy as indicated by changes in clinical status/labs    CKD-MBD, hyperphosphatemia -phos has been ok with CRRT, continue to monitor phos off   Acute metabolic encephalopathy, likely related to sepsis and uremia: improved initially but dec LOC overnight.  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 Avoid iron for now in light of active infection - in light of off abx as of today can consider iron load given iron deficient on 6/30 labs (t sat 5%) -hgb 8.4 > 7.7 > 6.6 transfusion 10.1  Dispo:  with comorbids not a good long term dialysis candidate.  Family wish to continue current care.  Next step will be to see if he tolerates iHD.   Subjective:   Seen and examined. D/w ICU RN.  Fio2 30%.Remains anuric.  Net +1.3L yesterday with CRRT which has been off since 11am.    Objective:   BP (!) 144/65   Pulse 64   Temp 99.1 F (37.3 C) (Axillary)   Resp (!) 30   Ht 5' 6"  (1.676 m)   Wt 80 kg   SpO2 98%   BMI 28.47 kg/m   Intake/Output Summary (Last 24 hours) at 07/28/2020 0953 Last data filed at 07/28/2020 0700 Gross per 24 hour  Intake 1380 ml  Output 432 ml  Net 948 ml    Weight change:   Physical Exam: NID:[id number] ill appearing, looks uncomfortable today CVS:s1s2, rrr Resp:trach, bl chest expansion ERX:VQMG Ext:1+ diffuse edema Neuro: quadriplegic, opening eyes, unable to determine cause of discomfort  Imaging: CT ABDOMEN PELVIS WO CONTRAST  Result Date: 07/26/2020 CLINICAL DATA:  70 year old male with sepsis. Fever. Pleural effusion. EXAM: CT CHEST, ABDOMEN AND PELVIS WITHOUT CONTRAST TECHNIQUE: Multidetector CT imaging of the chest, abdomen and pelvis was performed following the standard protocol without IV contrast. COMPARISON:  CT Abdomen and Pelvis 07/17/2020. Chest radiographs 07/24/2020 and earlier. FINDINGS: CT  CHEST FINDINGS Cardiovascular: Stable cardiac size at the upper limits of normal. No pericardial effusion. Mild Calcified aortic atherosclerosis. Right IJ approach dual lumen catheter. Calcified coronary artery atherosclerosis on series 3, image 34. Mediastinum/Nodes: Negative. No mediastinal mass or lymphadenopathy. Lungs/Pleura: Tracheostomy in place with probable  overinflation of the cuff (series 4, image 25). The major airways are patent. There is widespread tree-in-bud nodularity throughout both lungs with superimposed bilateral lower lobe bronchiectasis and consolidation with air bronchograms. Compared to 07/17/2020, ventilation in the bilateral middle lobes has improved. Small dependent pleural effusions have not significantly changed since that time. In the left cardiophrenic angle there is subpleural emphysema or lung scarring with early honeycombing. Musculoskeletal: Lower thoracic spine ankylosis related to both loss of disc spaces and endplate osteophytes. No acute or suspicious osseous abnormality identified in the chest. CT ABDOMEN PELVIS FINDINGS Hepatobiliary: Small volume perihepatic ascites has decreased. Evidence of gallbladder sludge. But no pericholecystic inflammation. No evidence of bile duct enlargement. Pancreas: Partially atrophied, otherwise negative. Spleen: Negative. Adrenals/Urinary Tract: Normal adrenal glands. Atrophied right kidney with double-J ureteral stent in place. Left kidney with large nearly 9 cm partially exophytic but simple fluid density cyst. Smaller left upper pole and midpole cysts. Multifocal left nephrolithiasis appears stable. No left hydronephrosis. Decompressed left ureter to the bladder. Bladder is decompressed by Foley catheter. Stomach/Bowel: Stable and negative rectum, blind-ending sigmoid colon with staple line on series 3, image 97. Descending colostomy with no adverse features. Much of the large bowel is decompressed. Cecum is on a lax mesentery. Terminal ileum is decompressed. No dilated small bowel. Normal appendix which terminates on coronal image 49. Gastrostomy tube with no adverse features. Negative duodenum. No free air. Vascular/Lymphatic: Aortoiliac calcified atherosclerosis. Normal caliber abdominal aorta. No lymphadenopathy. Reproductive: Urethral catheter in place with no adverse features. Other: Regressed  body wall edema since 07/17/2020. No pelvic free fluid. Musculoskeletal: Interbody ankylosis at L2-L3 has an appearance suggestive of healed discitis osteomyelitis. Stable visualized osseous structures. From the recent CT abdomen. No acute osseous abnormality identified. IMPRESSION: 1. Tracheostomy in place, cuff may be overinflated. 2. Widespread tree-in-bud nodularity throughout both lungs compatible with infective bronchiolitis (bacterial, viral, or fungal). Continued extensive lower lobe bronchiectasis and consolidation. However, middle lobe consolidation has regressed since 07/17/2020. Small pleural effusions not significantly changed since that time. 3. Regression of anasarca and small volume ascites since 07/17/2020. 4. No other acute or inflammatory process identified in the chest, abdomen, or pelvis. Chronically atrophic right kidney with double-J ureteral stent in place. Left nephrolithiasis without obstruction on that side. Bladder decompressed by Foley catheter. 5. Aortic Atherosclerosis (ICD10-I70.0). Electronically Signed   By: Genevie Ann M.D.   On: 07/26/2020 11:41   CT HEAD WO CONTRAST  Result Date: 07/27/2020 CLINICAL DATA:  Initial evaluation for mental status change, unknown cause. EXAM: CT HEAD WITHOUT CONTRAST TECHNIQUE: Contiguous axial images were obtained from the base of the skull through the vertex without intravenous contrast. COMPARISON:  Prior CT from 06/22/2020. FINDINGS: Brain: Cerebral volume within normal limits for patient age. Mild chronic microvascular ischemic disease noted involving the supratentorial cerebral white matter. No evidence for acute intracranial hemorrhage. No findings to suggest acute large vessel territory infarct. No mass lesion, midline shift, or mass effect. Ventricles are normal in size without evidence for hydrocephalus. No extra-axial fluid collection identified. Vascular: No hyperdense vessel identified. Skull: Scalp soft tissues demonstrate no acute  abnormality. Calvarium intact. Sinuses/Orbits: Globes and orbital soft tissues within normal limits. Visualized paranasal sinuses are clear.  No mastoid effusion. IMPRESSION: 1. No acute intracranial abnormality. 2. Mild chronic microvascular ischemic disease. Electronically Signed   By: Jeannine Boga M.D.   On: 07/27/2020 19:15   CT CHEST WO CONTRAST  Result Date: 07/26/2020 CLINICAL DATA:  70 year old male with sepsis. Fever. Pleural effusion. EXAM: CT CHEST, ABDOMEN AND PELVIS WITHOUT CONTRAST TECHNIQUE: Multidetector CT imaging of the chest, abdomen and pelvis was performed following the standard protocol without IV contrast. COMPARISON:  CT Abdomen and Pelvis 07/17/2020. Chest radiographs 07/24/2020 and earlier. FINDINGS: CT CHEST FINDINGS Cardiovascular: Stable cardiac size at the upper limits of normal. No pericardial effusion. Mild Calcified aortic atherosclerosis. Right IJ approach dual lumen catheter. Calcified coronary artery atherosclerosis on series 3, image 34. Mediastinum/Nodes: Negative. No mediastinal mass or lymphadenopathy. Lungs/Pleura: Tracheostomy in place with probable overinflation of the cuff (series 4, image 25). The major airways are patent. There is widespread tree-in-bud nodularity throughout both lungs with superimposed bilateral lower lobe bronchiectasis and consolidation with air bronchograms. Compared to 07/17/2020, ventilation in the bilateral middle lobes has improved. Small dependent pleural effusions have not significantly changed since that time. In the left cardiophrenic angle there is subpleural emphysema or lung scarring with early honeycombing. Musculoskeletal: Lower thoracic spine ankylosis related to both loss of disc spaces and endplate osteophytes. No acute or suspicious osseous abnormality identified in the chest. CT ABDOMEN PELVIS FINDINGS Hepatobiliary: Small volume perihepatic ascites has decreased. Evidence of gallbladder sludge. But no pericholecystic  inflammation. No evidence of bile duct enlargement. Pancreas: Partially atrophied, otherwise negative. Spleen: Negative. Adrenals/Urinary Tract: Normal adrenal glands. Atrophied right kidney with double-J ureteral stent in place. Left kidney with large nearly 9 cm partially exophytic but simple fluid density cyst. Smaller left upper pole and midpole cysts. Multifocal left nephrolithiasis appears stable. No left hydronephrosis. Decompressed left ureter to the bladder. Bladder is decompressed by Foley catheter. Stomach/Bowel: Stable and negative rectum, blind-ending sigmoid colon with staple line on series 3, image 97. Descending colostomy with no adverse features. Much of the large bowel is decompressed. Cecum is on a lax mesentery. Terminal ileum is decompressed. No dilated small bowel. Normal appendix which terminates on coronal image 49. Gastrostomy tube with no adverse features. Negative duodenum. No free air. Vascular/Lymphatic: Aortoiliac calcified atherosclerosis. Normal caliber abdominal aorta. No lymphadenopathy. Reproductive: Urethral catheter in place with no adverse features. Other: Regressed body wall edema since 07/17/2020. No pelvic free fluid. Musculoskeletal: Interbody ankylosis at L2-L3 has an appearance suggestive of healed discitis osteomyelitis. Stable visualized osseous structures. From the recent CT abdomen. No acute osseous abnormality identified. IMPRESSION: 1. Tracheostomy in place, cuff may be overinflated. 2. Widespread tree-in-bud nodularity throughout both lungs compatible with infective bronchiolitis (bacterial, viral, or fungal). Continued extensive lower lobe bronchiectasis and consolidation. However, middle lobe consolidation has regressed since 07/17/2020. Small pleural effusions not significantly changed since that time. 3. Regression of anasarca and small volume ascites since 07/17/2020. 4. No other acute or inflammatory process identified in the chest, abdomen, or pelvis.  Chronically atrophic right kidney with double-J ureteral stent in place. Left nephrolithiasis without obstruction on that side. Bladder decompressed by Foley catheter. 5. Aortic Atherosclerosis (ICD10-I70.0). Electronically Signed   By: Genevie Ann M.D.   On: 07/26/2020 11:41   DG Chest Port 1 View  Result Date: 07/27/2020 CLINICAL DATA:  Acute respiratory failure with hypoxia. EXAM: PORTABLE CHEST 1 VIEW COMPARISON:  07/24/2020. FINDINGS: Tracheostomy tube tip projects at the tracheal air column, just inferior to the clavicular heads, similar to prior. Similar prominent tracheostomy  cuff. Right IJ central venous catheter with the tip projecting expected region of the SVC, similar to prior. No substantial change in appearance of layering bilateral pleural effusions with bibasilar consolidation. No visible pneumothorax on this single semi erect AP radiograph. Similar cardiomediastinal silhouette, partially obscured. IMPRESSION: 1. Similar bilateral layering pleural effusions with bibasilar consolidation, better characterized on CT from yesterday. 2. Similar positioning of support devices, detailed above. Similar prominent tracheostomy cuff, potentially overinflated. Electronically Signed   By: Margaretha Sheffield MD   On: 07/27/2020 08:03    Labs: BMET Recent Labs  Lab 07/25/20 1721 07/25/20 1955 07/26/20 0500 07/26/20 1516 07/26/20 1753 07/27/20 0354 07/27/20 0728 07/28/20 0009 07/28/20 0317  NA 132* 133* 134* 134* 140 134* 141 136 137  K 6.0* 4.4 4.3 4.4 4.4 4.2 4.3 5.5* 5.5*  CL 104 102 101 103  --  101  --  102 103  CO2 23 28 28 28   --  27  --  25 24  GLUCOSE 127* 202* 206* 129*  --  179*  --  123* 110*  BUN 23 25* 23 22  --  28*  --  60* 67*  CREATININE 0.31* 0.31* 0.30* 0.33*  --  0.33*  --  0.62 0.69  CALCIUM 7.8* 8.1* 8.2* 8.3*  --  8.6*  --  8.7* 8.7*  PHOS 2.8 2.7 2.3* 2.8  --  3.1  --  6.5* 6.8*    CBC Recent Labs  Lab 07/24/20 1009 07/25/20 0319 07/26/20 0500 07/26/20 1753  07/27/20 0354 07/27/20 0728 07/28/20 0317  WBC 40.6* 36.8* 27.3*  --  18.3*  --  22.7*  NEUTROABS 36.3*  --   --   --   --   --   --   HGB 8.2* 8.4* 7.7* 8.8* 6.6* 10.5* 8.0*  HCT 26.0* 26.7* 25.2* 26.0* 21.3* 31.0* 24.6*  MCV 74.5* 76.7* 77.8*  --  76.6*  --  75.9*  PLT 488* 500* 409*  --  343  --  324     Medications:     amiodarone  200 mg Per Tube Daily   B-complex with vitamin C  1 tablet Per Tube Daily   buPROPion  75 mg Per Tube BID   chlorhexidine gluconate (MEDLINE KIT)  15 mL Mouth Rinse BID   Chlorhexidine Gluconate Cloth  6 each Topical Q1200   docusate  100 mg Per Tube BID   feeding supplement (PROSource TF)  90 mL Per Tube TID   guaiFENesin  5 mL Per Tube BID   hydrocortisone sod succinate (SOLU-CORTEF) inj  50 mg Intravenous Q12H   insulin aspart  0-20 Units Subcutaneous Q4H   insulin aspart  6 Units Subcutaneous Q4H   insulin glargine  20 Units Subcutaneous BID   mouth rinse  15 mL Mouth Rinse 10 times per day   midodrine  15 mg Per Tube Q8H   pantoprazole sodium  40 mg Per Tube QHS   polyethylene glycol  17 g Per Tube Daily   sertraline  100 mg Per Tube Daily   sodium chloride flush  10-40 mL Intracatheter Q12H     Jannifer Hick MD [facility] Pager [phone]

## 2020-07-28 NOTE — Progress Notes (Signed)
Assisted wife with tele-visit via elink

## 2020-07-28 NOTE — Progress Notes (Signed)
NAME:  Luis Orr, MRN:  [id number], DOB:  09/30/1950, LOS: 37 ADMISSION DATE:  06/24/2020, CONSULTATION DATE:  06/21/2020 REFERRING MD:  Dr Francia Greaves, EDP CHIEF COMPLAINT:  septic shock    History of Present Illness:  70 yo male from [facility] with hx of C3-C5 compressive myelopathy with functional quadriplegia and chronic trach/vent presented to ED with altered mental status and hypotension with recurrent HCAP.  Has multiple recent admissions for sepsis secondary to various infections including UTIs, pneumonias and bacteremia in setting of MDR Klebsiella, MRSA and pseudomonas. Most recent admission September 2021 in setting of sepsis due to MDR Klebsiella with right ureteral calculus s/p double J ureteral stent placement and treated with meropenem x 10days.  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 Central line placed. Drainage from gastrostomy tube >> CT Abd without fluid collection around gastrostomy tube but with diffuse body wall edema. Echo EF 50-55%, mild LVH  7/6 Renal Replacement Therapy ; Code status changed to partial Code (DNR) 7/7 Transfuse 1U PRBC 7/9 add solu cortef 7/10 off pressors, weaning stress steroids  7/11 Pressors added back on again 7/12 ID consult due to rising WBC, hypothermia, hypotension  concern for worsening infection. Vancomycin added. Repeat blood cx collected.  Interim History / Subjective:   Remains on minimal vent settings Off pressors >24 hours Less responsive last night. CT head ordered in setting of anticoagulation. No acute intracranial abnormality  Objective   Blood pressure (!) 150/66, pulse 70, temperature 99.3 F (37.4 C), temperature source Axillary, resp. rate (!) 30, height '5\' 6"'$  (1.676 m), weight 80 kg, SpO2 98 %.    Vent Mode: PRVC FiO2 (%):  [30 %] 30 % Set Rate:  [30 bmp] 30 bmp Vt Set:  [380 mL] 380 mL PEEP:  [5 cmH20] 5 cmH20 Plateau Pressure:  [16 cmH20-28 cmH20] 28 cmH20   Intake/Output Summary (Last 24 hours) at 07/28/2020 0700 Last data filed at 07/28/2020 0600 Gross per 24 hour  Intake 2223.33 ml  Output 721 ml  Net 1502.33 ml   Filed Weights   07/25/20 0500 07/26/20 0446 07/27/20 0500  Weight: 79.5 kg 78.9 kg 80 kg   Physical Exam: General: Chronically ill-appearing, no acute distress HENT: [place], AT, OP clear, MMM Neck: Trach in place, c/d/i Eyes: EOMI, no scleral icterus Respiratory: Clear to auscultation bilaterally.  No crackles, wheezing or rales Cardiovascular: RRR, -M/R/G, no JVD GI: BS+, soft, nontender, PEG in place Extremities:-Edema,-tenderness Neuro: Quadriplegic, opens eyes, intermittently track GU: Chronic foley in place   Pertinent Labs Na 137 K 5.5  BUN/Cr 67/0.69 - worsening BUN Phos 6.8  WBC 22.7 - increased  CT head 7/15 - No acute intracranial abnormalities  CXR 07/27/20 - Improved airspace opacities compared to 07/18/20.   CT A/P 07/26/20 - Bibasilar consolidation. Bilateral tree in bud. Improved consolidation and ascites compared to 07/17/20  Bcx 07/24/20 NGTD x 2d  Resolved Hospital Problem list   Hyperkalemia Tachycardia Hypotension Thrombocytosis  AKI  Assessment & Plan:  Septic shock 2nd to HCAP and UTI all present on admission.:  Acinetobacter in sputum culture from 07/12/20. Shock  resolved P: -Completed 14 days of Unasyn for Acinetobacter baumannii -Wean steroids -Continue midodrine -F/u blood cultures  Acute on chronic hypoxic and hypercarbic respiratory failure secondary pna and volume overload Tracheostomy and ventilator dependent status. ABG with improving respiratory acidosis P: -Continue full vent support. On minimal vent settings -trach care and VAP prevention in place -pulmonary hygiene -Sat goal >92% -Continue CRRT for volume removal  Transaminitis, likely 2/2 shock liver: Differential includes viral hepatitis, drug-induced, hepatic ischemia from shock, adrenal insufficiency. Previous CT on 7/5 showed no focal liver abnormality but noted possible sludge or stone in gallbladder without intra or extra-hepatic biliary dilation.  Improving P: -supportive care  Hypothermia P: -Continue steroids -on broad spectrum abx -Continue bair-hugger -trend CBC/fever  DM II poorly controlled with hyperglycemia. CBG improved with only one reading >200 in last 24 hours.  P: -Glucose range 145-206; likely elevated with steroid use; consider increasing tomorrow if persistently high -Continue Lantus 20U BID -Continue tube feed coverage -Continue resistant SSI and CBG monitoring  AKI with mild hyperkalemia Anasarca P: -continue CRRT per nephrology. Poor long term candidate for dialysis  Anemia of critical illness, chronic disease and iron deficiency: s/p 1U PRBC transfusion on 7/02 and 7/07 and 7/15; holding iron supplementation in setting of infection No s/sx active bleed P: -transfuse for hgb <7. One unit ordered this AM -trend cbc -f/u post transfusion CBC  Severe protein calorie malnutrition (POA) P: - continue tube feeds  Paroxysmal atrial fibrillation. P: -Continue amiodarone and Apixiban  Sacral decubitus ulcer stage IV (POA) P: - wound care  Acute metabolic encephalopathy  Hypoactive delirium Depression CT head 7/15 -  neg P: -continue Wellbutrin and sertraline  Goals of Care P: - continue medical therapy - DNR in event of cardiac arrest  Best Practice (right click and "Reselect all SmartList Selections" daily)   Diet/type: tubefeeds DVT prophylaxis: prophylactic heparin  GI prophylaxis: PPI Lines: Dialysis Catheter and yes and it is still needed Foley:  Yes, and it is still needed. Chronic foley due to quadriplegia. Exchanged 7/1 Code Status:  limited Disposition: likely back to [facility] once he is off CRRT and tolerating HD  Labs:   CMP Latest Ref Rng & Units 07/28/2020 07/28/2020 07/27/2020  Glucose 70 - 99 mg/dL 110(H) 123(H) -  BUN 8 - 23 mg/dL 67(H) 60(H) -  Creatinine 0.61 - 1.24 mg/dL 0.69 0.62 -  Sodium 135 - 145 mmol/L 137 136 141  Potassium 3.5 - 5.1 mmol/L 5.5(H) 5.5(H) 4.3  Chloride 98 - 111 mmol/L 103 102 -  CO2 22 - 32 mmol/L 24 25 -  Calcium 8.9 - 10.3 mg/dL 8.7(L) 8.7(L) -  Total Protein 6.5 - 8.1 g/dL - - -  Total Bilirubin 0.3 - 1.2 mg/dL - - -  Alkaline Phos 38 - 126 U/L - - -  AST 15 - 41 U/L - - -  ALT 0 - 44 U/L - - -    CBC Latest Ref Rng & Units 07/28/2020 07/27/2020 07/27/2020  WBC 4.0 - 10.5 K/uL 22.7(H) - 18.3(H)  Hemoglobin 13.0 - 17.0 g/dL 8.0(L) 10.5(L) 6.6(LL)  Hematocrit 39.0 - 52.0 % 24.6(L) 31.0(L) 21.3(L)  Platelets 150 - 400 K/uL 324 - 343    ABG    Component  Value Date/Time   PHART 7.271 (L) 07/27/2020 0728   PCO2ART 60.2 (H) 07/27/2020 0728   PO2ART 88 07/27/2020 0728   HCO3 27.9 07/27/2020 0728   TCO2 30 07/27/2020 0728   O2SAT 95.0 07/27/2020 0728    CBG (last 3)  Recent Labs    07/27/20 2035 07/27/20 2351 07/28/20 0313  GLUCAP 154* 115* 113*    Signature: 40 minutes   The patient is critically ill with multiple organ systems failure and requires high complexity decision making for assessment and support, frequent evaluation and titration of therapies, application of advanced monitoring technologies and extensive interpretation of  multiple databases.    Rodman Pickle, M.D. [facility] Pulmonary/Critical Care Medicine 07/28/2020 7:00 AM   Please see Amion for pager number to reach on-call Pulmonary and Critical Care Team.

## 2020-07-29 DIAGNOSIS — N179 Acute kidney failure, unspecified: Secondary | ICD-10-CM | POA: Diagnosis not present

## 2020-07-29 DIAGNOSIS — J9601 Acute respiratory failure with hypoxia: Secondary | ICD-10-CM | POA: Diagnosis not present

## 2020-07-29 DIAGNOSIS — A419 Sepsis, unspecified organism: Secondary | ICD-10-CM | POA: Diagnosis not present

## 2020-07-29 DIAGNOSIS — R6521 Severe sepsis with septic shock: Secondary | ICD-10-CM | POA: Diagnosis not present

## 2020-07-29 LAB — CBC
HCT: 23.3 % — ABNORMAL LOW (ref 39.0–52.0)
Hemoglobin: 7.5 g/dL — ABNORMAL LOW (ref 13.0–17.0)
MCH: 24.4 pg — ABNORMAL LOW (ref 26.0–34.0)
MCHC: 32.2 g/dL (ref 30.0–36.0)
MCV: 75.6 fL — ABNORMAL LOW (ref 80.0–100.0)
Platelets: 300 10*3/uL (ref 150–400)
RBC: 3.08 MIL/uL — ABNORMAL LOW (ref 4.22–5.81)
RDW: 31.2 % — ABNORMAL HIGH (ref 11.5–15.5)
WBC: 19.1 10*3/uL — ABNORMAL HIGH (ref 4.0–10.5)
nRBC: 5 % — ABNORMAL HIGH (ref 0.0–0.2)

## 2020-07-29 LAB — GLUCOSE, CAPILLARY
Glucose-Capillary: 143 mg/dL — ABNORMAL HIGH (ref 70–99)
Glucose-Capillary: 144 mg/dL — ABNORMAL HIGH (ref 70–99)
Glucose-Capillary: 210 mg/dL — ABNORMAL HIGH (ref 70–99)
Glucose-Capillary: 214 mg/dL — ABNORMAL HIGH (ref 70–99)
Glucose-Capillary: 279 mg/dL — ABNORMAL HIGH (ref 70–99)
Glucose-Capillary: 286 mg/dL — ABNORMAL HIGH (ref 70–99)
Glucose-Capillary: 300 mg/dL — ABNORMAL HIGH (ref 70–99)
Glucose-Capillary: 304 mg/dL — ABNORMAL HIGH (ref 70–99)

## 2020-07-29 LAB — CULTURE, BLOOD (ROUTINE X 2)
Culture: NO GROWTH
Culture: NO GROWTH
Special Requests: ADEQUATE

## 2020-07-29 LAB — RENAL FUNCTION PANEL
Albumin: 2.4 g/dL — ABNORMAL LOW (ref 3.5–5.0)
Albumin: 2.3 g/dL — ABNORMAL LOW (ref 3.5–5.0)
Anion gap: 12 (ref 5–15)
Anion gap: 15 (ref 5–15)
BUN: 131 mg/dL — ABNORMAL HIGH (ref 8–23)
BUN: 142 mg/dL — ABNORMAL HIGH (ref 8–23)
CO2: 21 mmol/L — ABNORMAL LOW (ref 22–32)
CO2: 19 mmol/L — ABNORMAL LOW (ref 22–32)
Calcium: 8.3 mg/dL — ABNORMAL LOW (ref 8.9–10.3)
Calcium: 8.3 mg/dL — ABNORMAL LOW (ref 8.9–10.3)
Chloride: 102 mmol/L (ref 98–111)
Chloride: 101 mmol/L (ref 98–111)
Creatinine, Ser: 1.07 mg/dL (ref 0.61–1.24)
Creatinine, Ser: 1.15 mg/dL (ref 0.61–1.24)
GFR, Estimated: >60 mL/min (ref >60)
GFR, Estimated: >60 mL/min (ref >60)
Glucose, Bld: 212 mg/dL — ABNORMAL HIGH (ref 70–99)
Glucose, Bld: 283 mg/dL — ABNORMAL HIGH (ref 70–99)
Phosphorus: 9.6 mg/dL — ABNORMAL HIGH (ref 2.5–4.6)
Phosphorus: 9.7 mg/dL — ABNORMAL HIGH (ref 2.5–4.6)
Potassium: 6.4 mmol/L (ref 3.5–5.1)
Potassium: 5.4 mmol/L — ABNORMAL HIGH (ref 3.5–5.1)
Sodium: 135 mmol/L (ref 135–145)
Sodium: 135 mmol/L (ref 135–145)

## 2020-07-29 LAB — BASIC METABOLIC PANEL
Anion gap: 9 (ref 5–15)
BUN: 130 mg/dL — ABNORMAL HIGH (ref 8–23)
CO2: 23 mmol/L (ref 22–32)
Calcium: 8 mg/dL — ABNORMAL LOW (ref 8.9–10.3)
Chloride: 102 mmol/L (ref 98–111)
Creatinine, Ser: 1.08 mg/dL (ref 0.61–1.24)
GFR, Estimated: >60 mL/min (ref >60)
Glucose, Bld: 347 mg/dL — ABNORMAL HIGH (ref 70–99)
Potassium: 5.8 mmol/L — ABNORMAL HIGH (ref 3.5–5.1)
Sodium: 134 mmol/L — ABNORMAL LOW (ref 135–145)

## 2020-07-29 LAB — HEPATIC FUNCTION PANEL
ALT: 107 U/L — ABNORMAL HIGH (ref 0–44)
AST: 143 U/L — ABNORMAL HIGH (ref 15–41)
Albumin: 2.4 g/dL — ABNORMAL LOW (ref 3.5–5.0)
Alkaline Phosphatase: 347 U/L — ABNORMAL HIGH (ref 38–126)
Bilirubin, Direct: 0.4 mg/dL — ABNORMAL HIGH (ref 0.0–0.2)
Indirect Bilirubin: 0.7 mg/dL (ref 0.3–0.9)
Total Bilirubin: 1.1 mg/dL (ref 0.3–1.2)
Total Protein: 6.8 g/dL (ref 6.5–8.1)

## 2020-07-29 LAB — POTASSIUM
Potassium: 5.4 mmol/L — ABNORMAL HIGH (ref 3.5–5.1)
Potassium: 5.4 mmol/L — ABNORMAL HIGH (ref 3.5–5.1)
Potassium: 5.3 mmol/L — ABNORMAL HIGH (ref 3.5–5.1)

## 2020-07-29 LAB — MAGNESIUM: Magnesium: 2.8 mg/dL — ABNORMAL HIGH (ref 1.7–2.4)

## 2020-07-29 MED ORDER — ALBUTEROL SULFATE (2.5 MG/3ML) 0.083% IN NEBU
10.0000 mg | INHALATION_SOLUTION | Freq: Once | RESPIRATORY_TRACT | Status: AC
  Administered 2020-07-29: 10 mg via RESPIRATORY_TRACT
  Filled 2020-07-29: qty 12

## 2020-07-29 MED ORDER — INSULIN ASPART 100 UNIT/ML IJ SOLN
0.0000 [IU] | Freq: Three times a day (TID) | INTRAMUSCULAR | Status: DC
  Administered 2020-07-29: 3 [IU] via SUBCUTANEOUS

## 2020-07-29 MED ORDER — SEVELAMER CARBONATE 800 MG PO TABS
800.0000 mg | ORAL_TABLET | Freq: Three times a day (TID) | ORAL | Status: DC
  Administered 2020-07-29 – 2020-08-01 (×9): 800 mg
  Filled 2020-07-29 (×10): qty 1

## 2020-07-29 MED ORDER — HYDROCORTISONE NA SUCCINATE PF 100 MG IJ SOLR
50.0000 mg | Freq: Every day | INTRAMUSCULAR | Status: DC
  Administered 2020-07-29: 50 mg via INTRAVENOUS
  Filled 2020-07-29: qty 2

## 2020-07-29 MED ORDER — CHLORHEXIDINE GLUCONATE CLOTH 2 % EX PADS
6.0000 | MEDICATED_PAD | Freq: Every day | CUTANEOUS | Status: DC
  Administered 2020-07-30: 6 via TOPICAL

## 2020-07-29 MED ORDER — SODIUM ZIRCONIUM CYCLOSILICATE 10 G PO PACK
10.0000 g | PACK | Freq: Once | ORAL | Status: AC
  Administered 2020-07-29: 10 g
  Filled 2020-07-29: qty 1

## 2020-07-29 MED ORDER — SODIUM CHLORIDE 0.9 % IV SOLN
250.0000 mg | Freq: Every day | INTRAVENOUS | Status: DC
  Filled 2020-07-29: qty 20

## 2020-07-29 MED ORDER — NOREPINEPHRINE 4 MG/250ML-% IV SOLN
0.0000 ug/min | INTRAVENOUS | Status: DC
  Administered 2020-07-29: 2 ug/min via INTRAVENOUS
  Administered 2020-07-30: 4 ug/min via INTRAVENOUS
  Filled 2020-07-29 (×4): qty 250

## 2020-07-29 MED ORDER — HYDROCORTISONE NA SUCCINATE PF 100 MG IJ SOLR
50.0000 mg | Freq: Four times a day (QID) | INTRAMUSCULAR | Status: DC
  Administered 2020-07-29 – 2020-07-30 (×3): 50 mg via INTRAVENOUS
  Filled 2020-07-29 (×3): qty 2

## 2020-07-29 MED ORDER — INSULIN ASPART 100 UNIT/ML IJ SOLN
0.0000 [IU] | INTRAMUSCULAR | Status: DC
  Administered 2020-07-29: 3 [IU] via SUBCUTANEOUS

## 2020-07-29 MED ORDER — INSULIN ASPART 100 UNIT/ML IV SOLN
5.0000 [IU] | Freq: Once | INTRAVENOUS | Status: AC
  Administered 2020-07-29: 5 [IU] via INTRAVENOUS

## 2020-07-29 MED ORDER — CALCIUM GLUCONATE-NACL 1-0.675 GM/50ML-% IV SOLN
1.0000 g | Freq: Once | INTRAVENOUS | Status: AC
  Administered 2020-07-29: 1000 mg via INTRAVENOUS
  Filled 2020-07-29: qty 50

## 2020-07-29 MED ORDER — INSULIN ASPART 100 UNIT/ML IJ SOLN
0.0000 [IU] | INTRAMUSCULAR | Status: DC
  Administered 2020-07-29 – 2020-07-30 (×2): 3 [IU] via SUBCUTANEOUS

## 2020-07-29 MED ORDER — DEXTROSE 50 % IV SOLN
1.0000 | Freq: Once | INTRAVENOUS | Status: AC
  Administered 2020-07-29: 50 mL via INTRAVENOUS
  Filled 2020-07-29: qty 50

## 2020-07-29 MED ORDER — SODIUM ZIRCONIUM CYCLOSILICATE 10 G PO PACK
10.0000 g | PACK | Freq: Every day | ORAL | Status: AC
  Administered 2020-07-29 – 2020-07-30 (×3): 10 g
  Filled 2020-07-29 (×3): qty 1

## 2020-07-29 MED ORDER — SODIUM CHLORIDE 0.9 % IV SOLN
250.0000 mg | Freq: Every day | INTRAVENOUS | Status: AC
  Administered 2020-07-30 – 2020-08-02 (×4): 250 mg via INTRAVENOUS
  Filled 2020-07-29 (×4): qty 20

## 2020-07-29 NOTE — Progress Notes (Addendum)
[facility] Progress Note    Assessment/ Plan:   AKI -baseline Cr 0.4. AKI likely secondary to ATN in the context of shock. CRRT initiated on 7/6 for anasarca with ineffective diuresis and uremia. Appreciate assistance from CCM for rij temp line placement 7/6 - CRRT 7/6 - 7/15, now off due to improved hemodynamics -Not on an ideal long term candidate for HD due to poor functional status, vent dependency. Dr. Candiss Norse voiced this concern to both son and wife over the phone on 7/6 and they had wished to still proceed forward with renal replacement therapy. Appreciate palliative care's assistance.  I again discussed concern for long term dialysis to wife and son 7/12; they remain hopeful for renal recovery. Called wife today to update - we had a nice conversation about where things stand.  The plan is continue iHD through this week given he's out of shock phase but if no signs of recovery then reconvene about GOC if he will be vent and dialysis dependent.  He already has been living at [facility] for several years prior to this admission.  Palliative care is involved which is appreciated. -Avoid nephrotoxic medications including NSAIDs and iodinated intravenous contrast exposure unless the latter is absolutely indicated.  Preferred narcotic agents for pain control are hydromorphone, fentanyl, and methadone. Morphine should not be used. Avoid Baclofen and avoid oral sodium phosphate and magnesium citrate based laxatives / bowel preps. Continue strict Input and Output monitoring. Will monitor the patient closely with you and intervene or adjust therapy as indicated by changes in clinical status/labs   Hyperkalemia shifted and lokelma given, recheck 12:00 today and further treat PRN, HD tomorrow 2K.   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 Avoid iron for now in light of active infection - in light of off abx as will give iron load as severely iron deficient on 6/30 labs (t sat 5%) -hgb 8.4 > 7.7 > 6.6 transfusion 8 > 7.5.   Dispo:  with comorbids not a good long term dialysis candidate.  Family wish to continue current care.  Next step will be to see if he tolerates iHD. D/w wife today .  Subjective:   Seen and examined. D/w ICU RN.  Fio2 30%.Remains anuric.  Net even yesterday. Hyperkalemia this AM being medically managed.    Objective:   BP 136/63   Pulse (!) 57   Temp 97.7 F (36.5 C) (Oral)   Resp (!) 30   Ht 5' 6"  (1.676 m)   Wt 80.4 kg   SpO2 100%   BMI 28.61 kg/m   Intake/Output Summary (Last 24 hours) at 07/29/2020 3790 Last data filed at 07/29/2020 2409 Gross per 24 hour  Intake 985 ml  Output 900 ml  Net 85 ml    Weight change:   Physical Exam: BDZ:[id number] ill appearing CVS:s1s2, rrr Resp:trach, bl chest expansion, on full fent support HDQ:QIWL Ext:1+ diffuse edema Neuro: quadriplegic, opening eyes, looking around but doesn't seem to be intentional ineraction  Imaging: CT HEAD WO CONTRAST  Result Date: 07/27/2020 CLINICAL DATA:  Initial evaluation  for mental status change, unknown cause. EXAM: CT HEAD WITHOUT CONTRAST TECHNIQUE: Contiguous axial images were obtained from the base of the skull through the vertex without intravenous contrast. COMPARISON:  Prior CT from 06/16/2020. FINDINGS: Brain: Cerebral volume within normal limits for patient age. Mild chronic microvascular ischemic disease noted involving the supratentorial cerebral white matter. No evidence for acute intracranial hemorrhage. No  findings to suggest acute large vessel territory infarct. No mass lesion, midline shift, or mass effect. Ventricles are normal in size without evidence for hydrocephalus. No extra-axial fluid collection identified. Vascular: No hyperdense vessel identified. Skull: Scalp soft tissues demonstrate no acute abnormality. Calvarium intact. Sinuses/Orbits: Globes and orbital soft tissues within normal limits. Visualized paranasal sinuses are clear. No mastoid effusion. IMPRESSION: 1. No acute intracranial abnormality. 2. Mild chronic microvascular ischemic disease. Electronically Signed   By: Jeannine Boga M.D.   On: 07/27/2020 19:15    Labs: BMET Recent Labs  Lab 07/26/20 0500 07/26/20 1516 07/26/20 1753 07/27/20 0354 07/27/20 0728 07/28/20 0009 07/28/20 0317 07/28/20 1954 07/29/20 0503 07/29/20 0645  NA 134* 134*   < > 134* 141 136 137 135 135 134*  K 4.3 4.4   < > 4.2 4.3 5.5* 5.5* 6.1* 6.4* 5.8*  CL 101 103  --  101  --  102 103 103 102 102  CO2 28 28  --  27  --  25 24 24  21* 23  GLUCOSE 206* 129*  --  179*  --  123* 110* 178* 212* 347*  BUN 23 22  --  28*  --  60* 67* 108* 131* 130*  CREATININE 0.30* 0.33*  --  0.33*  --  0.62 0.69 0.95 1.07 1.08  CALCIUM 8.2* 8.3*  --  8.6*  --  8.7* 8.7* 8.4* 8.3* 8.0*  PHOS 2.3* 2.8  --  3.1  --  6.5* 6.8* 8.5* 9.6*  --    < > = values in this interval not displayed.    CBC Recent Labs  Lab 07/24/20 1009 07/25/20 0319 07/26/20 0500 07/26/20 1753 07/27/20 0354 07/27/20 0728 07/28/20 0317 07/29/20 0503  WBC 40.6*   < > 27.3*  --  18.3*  --  22.7* 19.1*  NEUTROABS 36.3*  --   --   --   --   --   --   --   HGB 8.2*   < > 7.7*   < > 6.6* 10.5* 8.0* 7.5*  HCT 26.0*   < > 25.2*   < > 21.3* 31.0* 24.6* 23.3*  MCV 74.5*   < > 77.8*  --  76.6*  --  75.9* 75.6*  PLT 488*   < > 409*  --  343  --  324 300   < > = values in this interval not displayed.     Medications:     amiodarone  200 mg Per Tube Daily   B-complex with vitamin C  1  tablet Per Tube Daily   buPROPion  75 mg Per Tube BID   chlorhexidine gluconate (MEDLINE KIT)  15 mL Mouth Rinse BID   Chlorhexidine Gluconate Cloth  6 each Topical Q1200   docusate  100 mg Per Tube BID   feeding supplement (PROSource TF)  90 mL Per Tube TID   guaiFENesin  5 mL Per Tube BID   hydrocortisone sod succinate (SOLU-CORTEF) inj  50 mg Intravenous Daily   mouth rinse  15 mL Mouth Rinse 10 times per day   midodrine  15 mg  Per Tube Q8H   pantoprazole sodium  40 mg Per Tube QHS   polyethylene glycol  17 g Per Tube Daily   sertraline  100 mg Per Tube Daily   sodium chloride flush  10-40 mL Intracatheter Q12H   sodium zirconium cyclosilicate  10 g Per Tube Daily     Jannifer Hick MD [facility] Pager [phone]

## 2020-07-29 NOTE — Progress Notes (Signed)
NAME:  Luis Orr, MRN:  [id number], DOB:  29-Jul-1950, LOS: 30 ADMISSION DATE:  06/17/2020, CONSULTATION DATE:  06/22/2020 REFERRING MD:  Dr Francia Greaves, EDP CHIEF COMPLAINT:  septic shock    History of Present Illness:  70 yo male from [facility] with hx of C3-C5 compressive myelopathy with functional quadriplegia and chronic trach/vent presented to ED with altered mental status and hypotension with recurrent HCAP.  Has multiple recent admissions for sepsis secondary to various infections including UTIs, pneumonias and bacteremia in setting of MDR Klebsiella, MRSA and pseudomonas. Most recent admission September 2021 in setting of sepsis due to MDR Klebsiella with right ureteral calculus s/p double J ureteral stent placement and treated with meropenem x 10days.  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 Central line placed. Drainage from gastrostomy tube >> CT Abd without fluid collection around gastrostomy tube but with diffuse body wall edema. Echo EF 50-55%, mild LVH  7/6 Renal Replacement Therapy ; Code status changed to partial Code (DNR) 7/7 Transfuse 1U PRBC 7/9 add solu cortef 7/10 off pressors, weaning stress steroids  7/11 Pressors added back on again 7/12 ID consult due to rising WBC, hypothermia, hypotension  concern for worsening infection. Vancomycin added. Repeat blood cx collected. 7/17 Restarted on low dose levo. No evidence of new infection. Held off on antibiotics  Interim History / Subjective:   Restarted on low dose vasopressor this evening. More interactive this morning with tracking  Objective   Blood pressure (!) 121/57, pulse 61, temperature (!) 97.4 F (36.3 C), temperature source Oral, resp. rate (!) 30, height '5\' 6"'$  (1.676 m), weight 80.4 kg, SpO2 97 %.    Vent Mode: PRVC FiO2 (%):  [30 %] 30 % Set Rate:  [30 bmp] 30 bmp Vt Set:  [380 mL] 380 mL PEEP:  [5 cmH20] 5 cmH20 Plateau Pressure:  [23 cmH20-27 cmH20] 25 cmH20   Intake/Output Summary (Last 24 hours) at 07/29/2020 2131 Last data filed at 07/29/2020 1700 Gross per 24 hour  Intake 1289.17 ml  Output 430 ml  Net 859.17 ml   Filed Weights   07/26/20 0446 07/27/20 0500 07/29/20 0500  Weight: 78.9 kg 80 kg 80.4 kg    Physical Exam: General: Chronically ill-appearing, no acute distress HENT: [place], AT, OP clear, MMM Neck: Trach in place, c/d/i Eyes: EOMI, no scleral icterus Respiratory: Clear to auscultation bilaterally.  No crackles, wheezing or rales Cardiovascular: RRR, -M/R/G, no JVD GI: BS+, soft, nontender, PEG in place Extremities:-Edema,-tenderness Neuro: Awake, intermittently tracks, quadriplegic GU: Chronic foley in place    Pertinent Labs  WBC 19.1 - improving  Anemia stable Mild hyperkalemia and acidosis  CT head 7/15 - No acute intracranial abnormalities  CXR 07/27/20 - Improved airspace opacities compared to 07/18/20.   CT A/P 07/26/20 - Bibasilar consolidation. Bilateral tree in bud. Improved consolidation and ascites compared to 07/17/20  Bcx 07/24/20 NGTD final  Resolved Hospital Problem list   Hyperkalemia Tachycardia Hypotension Thrombocytosis  AKI  Assessment & Plan:  Septic shock 2nd to HCAP and UTI all present on admission.:  Acinetobacter in sputum culture from 07/12/20. Of  pressors on 4/14. Restarted  levo 7/17. P: -Completed 14 days of Unasyn for Acinetobacter baumannii -Restart stress dose steroids -Continue midodrine -If febrile, consider reculturing and broad spectrum antibiotics  Acute on chronic hypoxic and hypercarbic respiratory failure secondary pna and volume overload Tracheostomy and ventilator dependent status. ABG with improving respiratory acidosis P: -Continue full vent support. On minimal vent settings per SNF -trach care and VAP prevention in place -pulmonary hygiene -Sat goal >92% -iHD per Nephrology. Plan for tomorrow  Transaminitis, likely 2/2 shock liver: Differential includes viral hepatitis, drug-induced, hepatic ischemia from shock, adrenal insufficiency. Previous CT on 7/5 showed no focal liver abnormality but noted possible sludge or stone in gallbladder without intra or extra-hepatic biliary dilation.  Improving P: -supportive care  DM II poorly controlled with hyperglycemia. CBG improved with only one reading >200 in last 24 hours.  P: -Glucose range 145-206; likely elevated with steroid use; consider increasing tomorrow if persistently high -Continue Lantus 20U BID -Continue tube feed coverage -Continue resistant SSI and CBG monitoring  AKI with mild hyperkalemia Anasarca P: -continue iHD per nephrology. Poor long term candidate for dialysis  Anemia of critical illness, chronic disease and iron deficiency: s/p 1U PRBC transfusion on 7/02 and 7/07 and 7/15; holding iron supplementation in setting of infection No s/sx active bleed P: -transfuse for hgb <7. One unit ordered this AM -trend cbc -f/u post transfusion CBC  Severe protein calorie malnutrition (POA) P: - continue tube feeds  Paroxysmal atrial fibrillation. P: -Continue amiodarone and Apixiban  Sacral decubitus ulcer stage IV (POA) P: - wound care  Acute metabolic encephalopathy  Hypoactive delirium Depression CT head 7/15 - neg P: -continue  Wellbutrin and sertraline  Goals of Care P: - continue medical therapy - DNR in event of cardiac arrest  Best Practice (right click and "Reselect all SmartList Selections" daily)   Diet/type: tubefeeds DVT prophylaxis: prophylactic heparin  GI prophylaxis: PPI Lines: Dialysis Catheter and yes and it is still needed Foley:  Yes, and it is still needed. Chronic foley due to quadriplegia. Exchanged 7/1 Code Status:  limited Disposition: likely back to [facility] once he is off CRRT and tolerating HD  Labs:   CMP Latest Ref Rng & Units 07/29/2020 07/29/2020 07/29/2020  Glucose 70 - 99 mg/dL - - 283(H)  BUN 8 - 23 mg/dL - - 142(H)  Creatinine 0.61 - 1.24 mg/dL - - 1.15  Sodium 135 - 145 mmol/L - - 135  Potassium 3.5 - 5.1 mmol/L 5.4(H) 5.4(H) 5.4(H)  Chloride 98 - 111 mmol/L - - 101  CO2 22 - 32 mmol/L - - 19(L)  Calcium 8.9 - 10.3 mg/dL - - 8.3(L)  Total Protein 6.5 - 8.1 g/dL - - -  Total Bilirubin 0.3 - 1.2 mg/dL - - -  Alkaline Phos 38 - 126 U/L - - -  AST 15 - 41 U/L - - -  ALT 0 - 44 U/L - - -    CBC Latest Ref Rng & Units 07/29/2020 07/28/2020 07/27/2020  WBC 4.0 - 10.5 K/uL 19.1(H) 22.7(H) -  Hemoglobin 13.0 - 17.0 g/dL 7.5(L) 8.0(L) 10.5(L)  Hematocrit 39.0 - 52.0 % 23.3(L) 24.6(L) 31.0(L)  Platelets 150 - 400 K/uL 300 324 -    ABG  Component Value Date/Time   PHART 7.271 (L) 07/27/2020 0728   PCO2ART 60.2 (H) 07/27/2020 0728   PO2ART 88 07/27/2020 0728   HCO3 27.9 07/27/2020 0728   TCO2 30 07/27/2020 0728   O2SAT 95.0 07/27/2020 0728    CBG (last 3)  Recent Labs    07/29/20 1546 07/29/20 1951 07/29/20 2102  GLUCAP 300* 304* 286*    Signature: 40 minutes   The patient is critically ill with multiple organ systems failure and requires high complexity decision making for assessment and support, frequent evaluation and titration of therapies, application of advanced monitoring technologies and extensive interpretation of multiple databases.  Independent  Critical Care Time: 38 Minutes.   Rodman Pickle, M.D. [facility] Pulmonary/Critical Care Medicine 07/29/2020 9:31 PM   Please see Amion for pager number to reach on-call Pulmonary and Critical Care Team.

## 2020-07-29 NOTE — Progress Notes (Signed)
[place] Progress Note Patient Name: Luis Orr DOB: 06-12-1950 MRN: [id number]   Date of Service  07/29/2020  HPI/Events of Note  K+ 6.5  eICU Interventions  E-Link adult hyperkalemia treatment orders entered.        Kerry Kass Maraki Macquarrie 07/29/2020, 6:19 AM

## 2020-07-29 NOTE — Progress Notes (Signed)
Assisted family member with tele-visit via elink

## 2020-07-29 NOTE — Progress Notes (Signed)
Date and time results received: 07/29/20 6:05 AM  (use smartphrase ".now" to insert current time)  Test: Potassium Critical Value: 6.4  Name of Provider Notified: E-link  Orders Received? Or Actions Taken?: Orders Received

## 2020-07-29 NOTE — Progress Notes (Signed)
[facility] Progress Note Patient Name: Luis Orr DOB: [birth year] MRN: [id number]   Date of Service  07/29/2020  HPI/Events of Note  Patient trached, ventilated and on tube feeds. AC very sensitive Novolog SSL ordered.   eICU Interventions  Plan: Will change to Q 4 hour very sensitive Novolog SSI.      Intervention Category Major Interventions: Hyperglycemia - active titration of insulin therapy  Lysle Dingwall 07/29/2020, 8:59 PM

## 2020-07-30 DIAGNOSIS — J9601 Acute respiratory failure with hypoxia: Secondary | ICD-10-CM | POA: Diagnosis not present

## 2020-07-30 LAB — POTASSIUM
Potassium: 5.6 mmol/L — ABNORMAL HIGH (ref 3.5–5.1)
Potassium: 5.5 mmol/L — ABNORMAL HIGH (ref 3.5–5.1)
Potassium: 3 mmol/L — ABNORMAL LOW (ref 3.5–5.1)
Potassium: 3.3 mmol/L — ABNORMAL LOW (ref 3.5–5.1)

## 2020-07-30 LAB — CBC
HCT: 22.5 % — ABNORMAL LOW (ref 39.0–52.0)
Hemoglobin: 7.4 g/dL — ABNORMAL LOW (ref 13.0–17.0)
MCH: 24.7 pg — ABNORMAL LOW (ref 26.0–34.0)
MCHC: 32.9 g/dL (ref 30.0–36.0)
MCV: 75.3 fL — ABNORMAL LOW (ref 80.0–100.0)
Platelets: 279 10*3/uL (ref 150–400)
RBC: 2.99 MIL/uL — ABNORMAL LOW (ref 4.22–5.81)
RDW: 30.8 % — ABNORMAL HIGH (ref 11.5–15.5)
WBC: 19.5 10*3/uL — ABNORMAL HIGH (ref 4.0–10.5)
nRBC: 4 % — ABNORMAL HIGH (ref 0.0–0.2)

## 2020-07-30 LAB — RENAL FUNCTION PANEL
Albumin: 2.3 g/dL — ABNORMAL LOW (ref 3.5–5.0)
Anion gap: 14 (ref 5–15)
BUN: 165 mg/dL — ABNORMAL HIGH (ref 8–23)
CO2: 20 mmol/L — ABNORMAL LOW (ref 22–32)
Calcium: 7.8 mg/dL — ABNORMAL LOW (ref 8.9–10.3)
Chloride: 101 mmol/L (ref 98–111)
Creatinine, Ser: 1.39 mg/dL — ABNORMAL HIGH (ref 0.61–1.24)
GFR, Estimated: 55 mL/min — ABNORMAL LOW (ref >60)
Glucose, Bld: 339 mg/dL — ABNORMAL HIGH (ref 70–99)
Phosphorus: 11.1 mg/dL — ABNORMAL HIGH (ref 2.5–4.6)
Potassium: 5.8 mmol/L — ABNORMAL HIGH (ref 3.5–5.1)
Sodium: 135 mmol/L (ref 135–145)

## 2020-07-30 LAB — GLUCOSE, CAPILLARY
Glucose-Capillary: 275 mg/dL — ABNORMAL HIGH (ref 70–99)
Glucose-Capillary: 367 mg/dL — ABNORMAL HIGH (ref 70–99)
Glucose-Capillary: 127 mg/dL — ABNORMAL HIGH (ref 70–99)
Glucose-Capillary: 168 mg/dL — ABNORMAL HIGH (ref 70–99)
Glucose-Capillary: 185 mg/dL — ABNORMAL HIGH (ref 70–99)
Glucose-Capillary: 139 mg/dL — ABNORMAL HIGH (ref 70–99)

## 2020-07-30 LAB — MAGNESIUM: Magnesium: 2.8 mg/dL — ABNORMAL HIGH (ref 1.7–2.4)

## 2020-07-30 MED ORDER — SODIUM CHLORIDE 0.9 % IV SOLN: 100.0000 mL | INTRAVENOUS | Status: DC | PRN

## 2020-07-30 MED ORDER — MIDODRINE HCL 5 MG PO TABS
20.0000 mg | ORAL_TABLET | Freq: Three times a day (TID) | ORAL | Status: DC
  Administered 2020-07-30 – 2020-07-31 (×4): 20 mg
  Filled 2020-07-30 (×4): qty 4

## 2020-07-30 MED ORDER — ALTEPLASE 2 MG IJ SOLR
2.0000 mg | Freq: Once | INTRAMUSCULAR | Status: AC | PRN
  Filled 2020-07-30: qty 2

## 2020-07-30 MED ORDER — HYDROCORTISONE NA SUCCINATE PF 100 MG IJ SOLR
100.0000 mg | Freq: Two times a day (BID) | INTRAMUSCULAR | Status: DC
  Administered 2020-07-30 (×2): 100 mg via INTRAVENOUS
  Filled 2020-07-30 (×2): qty 2

## 2020-07-30 MED ORDER — HEPARIN SODIUM (PORCINE) 1000 UNIT/ML DIALYSIS
1000.0000 [IU] | INTRAMUSCULAR | Status: DC | PRN
  Administered 2020-08-02 – 2020-08-09 (×2): 1000 [IU] via INTRAVENOUS_CENTRAL
  Administered 2020-08-27: 1900 [IU] via INTRAVENOUS_CENTRAL
  Administered 2020-08-28: 1000 [IU] via INTRAVENOUS_CENTRAL
  Filled 2020-07-30 (×2): qty 1

## 2020-07-30 MED ORDER — RENA-VITE PO TABS
1.0000 | ORAL_TABLET | Freq: Every day | ORAL | Status: DC
  Administered 2020-07-30 – 2020-10-20 (×82): 1
  Filled 2020-07-30 (×84): qty 1

## 2020-07-30 MED ORDER — INSULIN GLARGINE 100 UNIT/ML ~~LOC~~ SOLN
20.0000 [IU] | Freq: Two times a day (BID) | SUBCUTANEOUS | Status: DC
  Administered 2020-07-30 – 2020-07-31 (×4): 20 [IU] via SUBCUTANEOUS
  Filled 2020-07-30 (×6): qty 0.2

## 2020-07-30 MED ORDER — PROSOURCE TF PO LIQD
45.0000 mL | Freq: Three times a day (TID) | ORAL | Status: DC
  Administered 2020-07-30 – 2020-08-19 (×62): 45 mL
  Filled 2020-07-30 (×59): qty 45

## 2020-07-30 MED ORDER — HEPARIN SODIUM (PORCINE) 1000 UNIT/ML IJ SOLN
INTRAMUSCULAR | Status: AC
  Administered 2020-07-30: 2400 [IU] via INTRAVENOUS_CENTRAL
  Filled 2020-07-30: qty 4

## 2020-07-30 MED ORDER — LIDOCAINE HCL (PF) 1 % IJ SOLN: 5.0000 mL | INTRAMUSCULAR | Status: DC | PRN

## 2020-07-30 MED ORDER — PENTAFLUOROPROP-TETRAFLUOROETH EX AERO: 1.0000 "application " | INHALATION_SPRAY | CUTANEOUS | Status: DC | PRN

## 2020-07-30 MED ORDER — INSULIN ASPART 100 UNIT/ML IJ SOLN
0.0000 [IU] | INTRAMUSCULAR | Status: DC
  Administered 2020-07-30: 3 [IU] via SUBCUTANEOUS
  Administered 2020-07-30: 20 [IU] via SUBCUTANEOUS
  Administered 2020-07-30 (×2): 4 [IU] via SUBCUTANEOUS
  Administered 2020-07-31 – 2020-08-02 (×5): 3 [IU] via SUBCUTANEOUS

## 2020-07-30 MED ORDER — INSULIN ASPART 100 UNIT/ML IJ SOLN
4.0000 [IU] | INTRAMUSCULAR | Status: DC
  Administered 2020-07-30 – 2020-08-02 (×16): 4 [IU] via SUBCUTANEOUS

## 2020-07-30 MED ORDER — SODIUM ZIRCONIUM CYCLOSILICATE 10 G PO PACK
10.0000 g | PACK | Freq: Once | ORAL | Status: AC
  Administered 2020-07-30: 10 g
  Filled 2020-07-30: qty 1

## 2020-07-30 MED ORDER — NEPRO/CARBSTEADY PO LIQD
1000.0000 mL | ORAL | Status: DC
  Administered 2020-07-30 – 2020-10-18 (×61): 1000 mL
  Filled 2020-07-30 (×99): qty 1000

## 2020-07-30 MED ORDER — HEPARIN SODIUM (PORCINE) 5000 UNIT/ML IJ SOLN
5000.0000 [IU] | Freq: Three times a day (TID) | INTRAMUSCULAR | Status: DC
  Administered 2020-07-30 – 2020-08-06 (×22): 5000 [IU] via SUBCUTANEOUS
  Filled 2020-07-30 (×20): qty 1

## 2020-07-30 MED ORDER — INSULIN ASPART 100 UNIT/ML IJ SOLN: 0.0000 [IU] | INTRAMUSCULAR | Status: DC

## 2020-07-30 NOTE — Progress Notes (Addendum)
Inpatient Diabetes Program Recommendations  AACE/ADA: New Consensus Statement on Inpatient Glycemic Control   Target Ranges:  Prepandial:   less than 140 mg/dL      Peak postprandial:   less than 180 mg/dL (1-2 hours)      Critically ill patients:  140 - 180 mg/dL  Results for SWEN, KERNS (MRN [id number]) as of 07/30/2020 09:27  Ref. Range 07/29/2020 07:54 07/29/2020 11:10 07/29/2020 15:46 07/29/2020 19:51 07/29/2020 21:02 07/29/2020 23:26 07/30/2020 03:30 07/30/2020 07:56  Glucose-Capillary Latest Ref Range: 70 - 99 mg/dL 210 (H) 214 (H) 300 (H)  Novolog 3 units 304 (H) 286 (H)  Novolog 3 units 279 (H)  Novolog 3 units 275 (H)  Novolog 3 units 367 (H)  Novolog 24 units   Review of Glycemic Control  Diabetes history: DM2 Outpatient Diabetes medications: Lantus 30 units daily, Regular 2-10 units Q6H Current orders for Inpatient glycemic control: Lantus 20 units BID, Novolog 0-20 units Q4H, Novolog 4 units Q4H; Solucortef 100 mg Q12H, Osmolite @ 50 ml/hr  Inpatient Diabetes Program Recommendations:    Insulin: No Lantus given on 07/29/20 and no tube feeding coverage given from 8am on 07/29/20 until this morning at 8am (tube feeding coverage given this morning at 8:09 am today). As a result glucose 367 mg/dl this morning.    Thanks, Barnie Alderman, RN, MSN, CDE Diabetes Coordinator Inpatient Diabetes Program [phone] (Team Pager from 8am to 5pm)

## 2020-07-30 NOTE — Progress Notes (Signed)
[facility] Progress Note    Assessment/ Plan:   AKI -baseline Cr 0.4. AKI likely secondary to ATN in the context of shock. CRRT initiated on 7/6 for anasarca with ineffective diuresis and uremia.  CCM for rij temp line placement 65/80- 60 days old - CRRT 7/6 - 7/15, now off due to improved hemodynamics -Not a good long term candidate for HD due to poor functional status, vent dependency. Dr. Candiss Norse voiced this concern to both son and wife over the phone on 7/6 and they had wished to still proceed forward with renal replacement therapy. Appreciate palliative care's assistance.  Dr. Johnney Ou discussed concern for long term dialysis to wife and son 7/12 and again on 7/17.  The plan is continue iHD through this week given he's out of shock phase but if no signs of recovery then reconvene about GOC if he will be vent and dialysis dependent.  He already has been living at [facility] for several years prior to this admission.  Palliative care is involved which is appreciated.  UOP might be increasing.  Plan is for IHD today   Hyperkalemia shifted and lokelma given PRN, HD today 2K.   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 give iron load as severely iron deficient on 6/30 labs (t sat 5%) -hgb 8.4 > 7.7 > 6.6 transfusion 8 > 7.5. Add ESA  Dispo:  with comorbids not a good long term dialysis candidate.  Family wish to continue current care.  Next step will be to see if he tolerates iHD.   Subjective:   Seen and examined. D/w ICU RN.   Fio2 30%.UOP increasing slightly but back on pressors.   Hyperkalemia being medically managed.    Objective:   BP (!) 148/63   Pulse 72   Temp 98.7 F (37.1 C) (Oral)   Resp (!) 30   Ht 5' 6"  (1.676 m)   Wt 80.4 kg   SpO2 98%   BMI 28.61 kg/m   Intake/Output Summary (Last 24 hours) at 07/30/2020 0728 Last data filed at 07/30/2020 6606 Gross per 24 hour  Intake 1554.35 ml  Output 230 ml  Net 1324.35 ml   Weight change: 0 kg  Physical Exam: [id number] ill appearing CVS:s1s2, rrr Resp:trach, bl chest expansion, on full vent support RVU:YEBX Ext:1-2+ diffuse edema Neuro: quadriplegic, opening eyes, looking around but doesn't seem to be intentional interaction  Imaging: No results found.  Labs: BMET Recent Labs  Lab 07/27/20 0354 07/27/20 4356 07/28/20 0009 07/28/20 0317 07/28/20 1954 07/29/20 0503 07/29/20 0645 07/29/20 1122 07/29/20 1419 07/29/20 1812 07/29/20 2224 07/30/20 0211 07/30/20 0540  NA 134*   < > 136 137 135 135 134* 135  --   --   --   --  135  K 4.2   < > 5.5* 5.5* 6.1* 6.4* 5.8* 5.4* 5.4* 5.4* 5.3* 5.6* 5.8*  CL 101  --  102 103 103 102 102 101  --   --   --   --  101  CO2 27  --  25 24 24  21* 23  19*  --   --   --   --  20*  GLUCOSE 179*  --  123* 110* 178* 212* 347* 283*  --   --   --   --  339*  BUN 28*  --  60* 67* 108* 131* 130* 142*  --   --   --   --  165*  CREATININE 0.33*  --  0.62 0.69 0.95 1.07 1.08 1.15  --   --   --   --  1.39*  CALCIUM 8.6*  --  8.7* 8.7* 8.4* 8.3* 8.0* 8.3*  --   --   --   --  7.8*  PHOS 3.1  --  6.5* 6.8* 8.5* 9.6*  --  9.7*  --   --   --   --  11.1*   < > = values in this interval not displayed.   CBC Recent Labs  Lab 07/24/20 1009 07/25/20 0319 07/26/20 0500 07/26/20 1753 07/27/20 0354 07/27/20 0728 07/28/20 0317 07/29/20 0503  WBC 40.6*   < > 27.3*  --  18.3*  --  22.7* 19.1*  NEUTROABS 36.3*  --   --   --   --   --   --   --   HGB 8.2*   < > 7.7*   < > 6.6* 10.5* 8.0* 7.5*  HCT 26.0*   < >  25.2*   < > 21.3* 31.0* 24.6* 23.3*  MCV 74.5*   < > 77.8*  --  76.6*  --  75.9* 75.6*  PLT 488*   < > 409*  --  343  --  324 300   < > = values in this interval not displayed.    Medications:     heparin sodium (porcine)       amiodarone  200 mg Per Tube Daily   B-complex with vitamin C  1 tablet Per Tube Daily   buPROPion  75 mg Per Tube BID   chlorhexidine gluconate (MEDLINE KIT)  15 mL Mouth Rinse BID   Chlorhexidine Gluconate Cloth  6 each Topical Q1200   Chlorhexidine Gluconate Cloth  6 each Topical Q0600   docusate  100 mg Per Tube BID   feeding supplement (PROSource TF)  90 mL Per Tube TID   guaiFENesin  5 mL Per Tube BID   hydrocortisone sod succinate (SOLU-CORTEF) inj  50 mg Intravenous Q6H   insulin aspart  0-6 Units Subcutaneous Q4H   mouth rinse  15 mL Mouth Rinse 10 times per day   midodrine  15 mg Per Tube Q8H   pantoprazole sodium  40 mg Per Tube QHS   polyethylene glycol  17 g Per Tube Daily   sertraline  100 mg Per Tube Daily   sevelamer carbonate  800 mg Per Tube TID WC   sodium chloride flush  10-40 mL Intracatheter Q12H     [facility]

## 2020-07-30 NOTE — Progress Notes (Addendum)
NAME:  Luis Orr, MRN:  [id number], DOB:  May 02, 1950, LOS: 70 ADMISSION DATE:  07/09/2020, CONSULTATION DATE:  07/03/2020 REFERRING MD:  Dr Francia Greaves, EDP CHIEF COMPLAINT:  septic shock    History of Present Illness:  70 yo male from [facility] with hx of C3-C5 compressive myelopathy with functional quadriplegia and chronic trach/vent presented to ED with altered mental status and hypotension with recurrent HCAP.  Has multiple recent admissions for sepsis secondary to various infections including UTIs, pneumonias and bacteremia in setting of MDR Klebsiella, MRSA and pseudomonas. Most recent admission September 2021 in setting of sepsis due to MDR Klebsiella with right ureteral calculus s/p double J ureteral stent placement and treated with meropenem x 10days.  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 Central line placed. Drainage from gastrostomy tube >> CT Abd without fluid collection around gastrostomy tube but with diffuse body wall edema. Echo EF 50-55%, mild LVH  7/6 Renal Replacement Therapy ; Code status changed to partial Code (DNR) 7/7 Transfuse 1U PRBC 7/9 add solu cortef 7/10 off pressors, weaning stress steroids  7/11 Pressors added back on again 7/12 ID consult due to rising WBC, hypothermia, hypotension concern for worsening infection. Vancomycin added. Repeat blood cx collected. 7/15 CRRT stopped   7/17 Restarted on low dose levo. No evidence of new infection. Held off on antibiotics. Nephrology contacted family and decision was made to continue iHD this week to allow additional time for renal recovery. Patient is not a long term iHD candidate  7/18 plans for iHD today   Interim History / Subjective:  No acute issues overnight Remains on low dose Levo with trach vent  45m urine out over the last 24hrs  Objective   Blood pressure (!) 148/63, pulse 72, temperature 98.7 F (37.1 C), temperature source Oral, resp. rate (!) 30, height '5\' 6"'$  (1.676 m), weight 80.4 kg, SpO2 98 %.    Vent Mode: PRVC FiO2 (%):  [30 %] 30 % Set Rate:  [30 bmp] 30 bmp Vt Set:  [380 mL] 380 mL PEEP:  [5 cmH20] 5 cmH20 Plateau Pressure:  [23 cmH20-25 cmH20] 25 cmH20   Intake/Output Summary (Last 24 hours) at 07/30/2020 0719 Last data filed at 07/30/2020 [id number] per 24 hour  Intake 1554.35 ml  Output 230 ml  Net 1324.35 ml    Filed Weights   07/27/20 0500 07/29/20 0500 07/30/20 0500  Weight: 80 kg 80.4 kg 80.4 kg    Physical Exam: General: Chronically ill appearing deconditioned elderly male lying in bed on mechanical ventilation through trach, in NAD HEENT: Shiley cuffed XLT trach midline, MM pink/moist, PERRL,  Neuro: Will open eyes to verbal and physical stimuli, very weak but attempts to follow basic commands  CV: s1s2 regular rate and rhythm, no murmur, rubs, or gallops,  PULM:  Clear to ascultation bilaterally, no increased work of breathing, tolerating vent, no added breath sounds  GI: soft, bowel sounds active in  all 4 quadrants, non-tender, non-distended, tolerating TF, colostomy with stool in bag, PEG tube  Extremities: warm/dry, 3+ anasarca   Skin: no rashes or lesions  Pertinent labs/images:  CT head 7/15 - No acute intracranial abnormalities  CXR 07/27/20 - Improved airspace opacities compared to 07/18/20.   CT A/P 07/26/20 - Bibasilar consolidation. Bilateral tree in bud. Improved  consolidation and ascites compared to 07/17/20  Bcx 07/24/20 NGTD final  7/18 AM labs pending   Resolved Hospital Problem list   Hyperkalemia Tachycardia Hypotension Thrombocytosis  AKI Septic shock 2nd to HCAP and UTI all present on admission -Acinetobacter in sputum culture from 07/12/20. Completed 14 days of Unasyn for -Of pressors on 4/14. Restarted  levo 7/17.  Assessment & Plan:  Acute on chronic hypoxic and hypercarbic respiratory failure secondary pna and volume overload Tracheostomy and ventilator dependent status. P: Continue ventilator support with lung protective strategies  Wean PEEP and FiO2 for sats greater than 90%. Head of bed elevated 30 degrees. Plateau pressures less than 30 cm H20.  Follow intermittent chest x-ray and ABG.   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 -CBG elevated in the upper 200's-300's  P: Increased SSI to moderate resistance  May need to add TF coverage  Continue Lantus   Transaminitis, likely 2/2 shock liver:  -Differential includes viral hepatitis, drug-induced, hepatic ischemia from shock, adrenal insufficiency. Previous CT on 7/5 showed no focal liver abnormality but noted possible sludge or stone in gallbladder without intra or extra-hepatic biliary dilation.  Improving P: LFT elevation improving as of 7/17 Avoid hepatotoxins  Supportive care   AKI with mild hyperkalemia Anasarca -Baseline creatinine 0.4, AKI felt secondary to ATN in the setting of sepsis  -Nephrology contacted family and decision was made to continue iHD this week to allow additional time for renal recovery. Patient is not a long term iHD candidate   P: Nephrology following, appreciate assistance  iHD planned for today 7/18 Follow renal function  Monitor urine output Trend Bmet Avoid nephrotoxins Ensure adequate renal perfusion   Anemia of critical illness, chronic disease and iron deficiency:  -S/p 1U PRBC transfusion on 7/02 and 7/07 and 7/15; holding iron supplementation in setting of infection -No s/sx active bleed P: Transfuse per protocol  Trend CBC  Severe protein calorie malnutrition (POA) P: Continue TF  Protein supplementation   Paroxysmal atrial fibrillation. P: Remains on Amiodarone  Continuous telemetry   Sacral decubitus ulcer stage IV (POA) P: Pressure alleviating devices  Wound care Frequent turns   Acute metabolic encephalopathy  Hypoactive delirium Depression -CT head 7/15 - neg P: Maintain neuro protective measures Nutrition and bowel regiment  Aspirations precautions  Continue Wellbutrin and Sertraline   Goals of Care - DNR in event of cardiac arrest P: Continue to engage with family to determine best GOC moving forward    Best Practice    Diet/type: tubefeeds DVT prophylaxis: prophylactic heparin  GI prophylaxis: PPI Lines: Dialysis Catheter and yes and it is still needed Foley:  Yes, and it is still needed. Chronic foley due to quadriplegia. Exchanged 7/1 Code Status:  limited Disposition: likely back to [facility] once he is off CRRT and tolerating HD  Critical  care:    Performed by: Jeffre Enriques D. Harris  Total critical care time: 37 minutes  Critical care time was exclusive of separately billable procedures and treating other patients.  Critical care was necessary to treat or prevent imminent or life-threatening deterioration.  Critical care was time spent personally by me on the following activities: development of treatment plan with patient and/or surrogate as well as nursing, discussions with consultants, evaluation of patient's response to treatment, examination of  patient, obtaining history from patient or surrogate, ordering and performing treatments and interventions, ordering and review of laboratory studies, ordering and review of radiographic studies, pulse oximetry and re-evaluation of patient's condition.  Lisamarie Coke D. Kenton Kingfisher, NP-C [facility] Personal contact information can be found on Amion  07/30/2020, 7:26 AM

## 2020-07-30 NOTE — Progress Notes (Signed)
Nutrition Follow-up  DOCUMENTATION CODES:   Not applicable  INTERVENTION:   TF via PEG (change formula to decrease phos and K provision): Change to Nepro at 45 ml/h (1080 ml per day). Prosource TF 45 ml TID  Provides 2064 kcal, 120 gm protein, 785 ml free water daily.  D/C B-complex with vitamin C, change to Renal MVI daily.  Monitor for tolerance of tube feeding given change to a higher fat, fiber containing formula.  NUTRITION DIAGNOSIS:   Increased nutrient needs related to wound healing as evidenced by estimated needs.  Ongoing   GOAL:   Patient will meet greater than or equal to 90% of their needs  Met with TF  MONITOR:   Vent status, Labs, Weight trends, TF tolerance, Skin, I & O's  REASON FOR ASSESSMENT:   Ventilator, Consult Enteral/tube feeding initiation and management  ASSESSMENT:   70 year old male who presented to the ED on 6/29 from [facility] with AMS and hypotension. 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 Pt admitted with septic shock, acute on chronic hypoxemic respiratory failure.  Discussed patient in ICU rounds and with RN today. Off CRRT since 7/15. Phosphorus elevated. Renvela started 7/17. Receiving extra doses of Lokelma for hyperkalemia.  Receiving iHD today.   Receiving Osmolite 1.5 at 50 ml/h with Prosource TF 90 ml TID via PEG to provide 2040 kcal, 141 gm protein, 914 ml free water daily.   Patient remains intubated on ventilator support MV: 11 L/min Temp (24hrs), Avg:97.8 F (36.6 C), Min:97.4 F (36.3 C), Max:98.7 F (37.1 C)   Weight trending back up with transition from CRRT to iHD. Admission weight 90.2 kg Current weight 83.5 kg Lowest weight since admission 78.9 kg on 7/14  I/O -1.8 L since admission UOP 130 ml x 24 hours Colostomy output 150 ml  x 24 hours  Labs reviewed. K 5.6-->5.8-->5.5, Phos 11.1, Mag 2.8 Phosphorus levels continue to rise despite adding Renvela 7/17.  CBG: [phone]  Medications reviewed and include B-complex with C, solu-cortef, novolog, Lantus, Protonix, Renvela, ferric gluconate, Levophed.   Diet Order:   Diet Order             Diet NPO time specified  Diet effective now                   EDUCATION NEEDS:   No education needs have been identified at this time  Skin:  Skin Assessment: Skin Integrity Issues: Skin Integrity Issues:: Stage I DTI: sacrum, L ear Stage I: R ankle Stage IV: sacrum  Last BM:  7/18 colostomy  Height:   Ht Readings from Last 1 Encounters:  07/27/20 _0  (1.676 m)    Weight:   Wt Readings from Last 1 Encounters:  07/30/20 83.5 kg    Ideal Body Weight:  58 kg (adjusted for quadriplegia)  BMI:  Body mass index is 29.71 kg/m.  Estimated Nutritional Needs:   Kcal:  1900-2100  Protein:  110-120 gm  Fluid:  1 L + UOP    Lucas Mallow, RD, LDN, CNSC Please refer to Amion for contact information.

## 2020-07-30 NOTE — Progress Notes (Signed)
[facility] Progress Note Patient Name: Luis Orr DOB: Aug 20, 1950 MRN: [id number]   Date of Service  07/30/2020  HPI/Events of Note  Hyperkalemia - K+ = 5.3.   eICU Interventions  Plan: Lokelma 10 gm per tube X 1 now. (Extra Dose)     Intervention Category Major Interventions: Electrolyte abnormality - evaluation and management  Brenya Taulbee [place] 07/30/2020, 12:03 AM

## 2020-07-31 DIAGNOSIS — J9601 Acute respiratory failure with hypoxia: Secondary | ICD-10-CM | POA: Diagnosis not present

## 2020-07-31 LAB — CBC
HCT: 21 % — ABNORMAL LOW (ref 39.0–52.0)
Hemoglobin: 6.9 g/dL — CL (ref 13.0–17.0)
MCH: 24.7 pg — ABNORMAL LOW (ref 26.0–34.0)
MCHC: 32.9 g/dL (ref 30.0–36.0)
MCV: 75.3 fL — ABNORMAL LOW (ref 80.0–100.0)
Platelets: 246 10*3/uL (ref 150–400)
RBC: 2.79 MIL/uL — ABNORMAL LOW (ref 4.22–5.81)
RDW: 30.3 % — ABNORMAL HIGH (ref 11.5–15.5)
WBC: 11.5 10*3/uL — ABNORMAL HIGH (ref 4.0–10.5)
nRBC: 8.1 % — ABNORMAL HIGH (ref 0.0–0.2)

## 2020-07-31 LAB — GLUCOSE, CAPILLARY
Glucose-Capillary: 120 mg/dL — ABNORMAL HIGH (ref 70–99)
Glucose-Capillary: 120 mg/dL — ABNORMAL HIGH (ref 70–99)
Glucose-Capillary: 123 mg/dL — ABNORMAL HIGH (ref 70–99)
Glucose-Capillary: 129 mg/dL — ABNORMAL HIGH (ref 70–99)
Glucose-Capillary: 88 mg/dL (ref 70–99)
Glucose-Capillary: 89 mg/dL (ref 70–99)

## 2020-07-31 LAB — RENAL FUNCTION PANEL
Albumin: 2.3 g/dL — ABNORMAL LOW (ref 3.5–5.0)
Anion gap: 15 (ref 5–15)
BUN: 94 mg/dL — ABNORMAL HIGH (ref 8–23)
CO2: 24 mmol/L (ref 22–32)
Calcium: 8.3 mg/dL — ABNORMAL LOW (ref 8.9–10.3)
Chloride: 96 mmol/L — ABNORMAL LOW (ref 98–111)
Creatinine, Ser: 0.87 mg/dL (ref 0.61–1.24)
GFR, Estimated: >60 mL/min (ref >60)
Glucose, Bld: 145 mg/dL — ABNORMAL HIGH (ref 70–99)
Phosphorus: 6.3 mg/dL — ABNORMAL HIGH (ref 2.5–4.6)
Potassium: 3.8 mmol/L (ref 3.5–5.1)
Sodium: 135 mmol/L (ref 135–145)

## 2020-07-31 LAB — PREPARE RBC (CROSSMATCH)

## 2020-07-31 LAB — POTASSIUM: Potassium: 3.2 mmol/L — ABNORMAL LOW (ref 3.5–5.1)

## 2020-07-31 LAB — MAGNESIUM: Magnesium: 2.3 mg/dL (ref 1.7–2.4)

## 2020-07-31 MED ORDER — POTASSIUM CHLORIDE 20 MEQ PO PACK
20.0000 meq | PACK | Freq: Once | ORAL | Status: AC
  Administered 2020-07-31: 20 meq
  Filled 2020-07-31: qty 1

## 2020-07-31 MED ORDER — SODIUM BICARBONATE 650 MG PO TABS
650.0000 mg | ORAL_TABLET | Freq: Once | ORAL | Status: AC
  Administered 2020-07-31: 650 mg
  Filled 2020-07-31: qty 1

## 2020-07-31 MED ORDER — PANCRELIPASE (LIP-PROT-AMYL) 10440-39150 UNITS PO TABS
20880.0000 [IU] | ORAL_TABLET | Freq: Once | ORAL | Status: AC
  Administered 2020-07-31: 20880 [IU]
  Filled 2020-07-31: qty 2

## 2020-07-31 MED ORDER — MIDODRINE HCL 5 MG PO TABS
15.0000 mg | ORAL_TABLET | Freq: Three times a day (TID) | ORAL | Status: DC
  Administered 2020-07-31 – 2020-08-02 (×5): 15 mg
  Filled 2020-07-31 (×5): qty 3

## 2020-07-31 MED ORDER — OXYCODONE HCL 5 MG/5ML PO SOLN
5.0000 mg | Freq: Four times a day (QID) | ORAL | Status: DC | PRN
  Administered 2020-08-03 – 2020-09-17 (×39): 5 mg
  Filled 2020-07-31 (×42): qty 5

## 2020-07-31 MED ORDER — CHLORHEXIDINE GLUCONATE CLOTH 2 % EX PADS: 6.0000 | MEDICATED_PAD | Freq: Every day | CUTANEOUS | Status: DC

## 2020-07-31 MED ORDER — FENTANYL CITRATE (PF) 100 MCG/2ML IJ SOLN
50.0000 ug | INTRAMUSCULAR | Status: DC | PRN
Start: 2020-07-31 — End: 2020-09-09
  Administered 2020-08-10 – 2020-09-09 (×14): 50 ug via INTRAVENOUS
  Filled 2020-07-31 (×16): qty 2

## 2020-07-31 MED ORDER — HYDROCORTISONE NA SUCCINATE PF 100 MG IJ SOLR
100.0000 mg | Freq: Every day | INTRAMUSCULAR | Status: AC
  Administered 2020-07-31 – 2020-08-01 (×2): 100 mg via INTRAVENOUS
  Filled 2020-07-31 (×2): qty 2

## 2020-07-31 NOTE — Progress Notes (Signed)
Dr. Vaughan Browner was notified of clogged feeding tube. Will initiate feeding tube unclogging protocol.Marland Kitchen

## 2020-07-31 NOTE — Progress Notes (Signed)
Date and time results received: 07/31/20 6:43 AM  (use smartphrase ".now" to insert current time)  Test: Hemoglobin Critical Value: 6.9  Name of Provider Notified: E-link  Orders Received? Or Actions Taken?: Orders Received - See Orders for details

## 2020-07-31 NOTE — Progress Notes (Signed)
[facility] Progress Note    Assessment/ Plan:   AKI -baseline Cr 0.4. AKI likely due to ATN in the context of shock. CRRT initiated on 7/6 for anasarca with ineffective diuresis and uremia.  CCM for rij temp line placement 17/57- 58 days old - CRRT 7/6 - 7/15, now off due to improved hemodynamics -Not a good long term candidate for HD due to poor functional status, vent dependency. Dr. Candiss Norse voiced this concern to both son and wife over the phone on 7/6 and they had wished to still proceed forward with renal replacement therapy. Appreciate palliative care's assistance.  Dr. Johnney Ou discussed concern for long term dialysis to wife and son 7/12 and again on 7/17.  The plan is continue iHD through this week given he's out of shock phase but if no signs of recovery then reconvene about GOC if he will be vent and dialysis dependent.  He already has been living at [facility] for several years prior to this admission.  Palliative care is involved which is appreciated.  UOP might be increasing.  Plan is for IHD maybe tomorrow but looking at labs first   Hyperkalemia shifted and lokelma given PRN, HD today 2K. Is good today at 3.8  CKD-MBD, hyperphosphatemia -phos had been ok with CRRT, then up, on nepro, started renvela  Acute metabolic encephalopathy, likely related to sepsis and uremia:   CT head unrevealing, per primary  Septic shock:  resolved -off pressors but needed transiently on 7/18 -ID following -off abx as of 7/15  Chronic respiratory failure s/p trach -vent mgmt per ccm  Quadriplegia 2/2 compressive myelopathy -resides at [place] -appreciate palliative care's assistance  Microcytic anemia -transfuse for hgb <7. give iron load as severely iron deficient on 6/30 labs (t sat 5%) -hgb 8.4 > 7.7 > 6.6 transfusion 8 > 7.5.....7.4 today. Added ESA  Dispo:  with comorbids not a good long term dialysis candidate.  Family wish to continue current care.  Next step will be to  see if he tolerates iHD.   He has done that   Subjective:   HD yest-  removed 2000- off of pressors again this AM-  UOP 275-  up from 130 the day before    Objective:   BP 137/78   Pulse (!) 59   Temp (!) 97.2 F (36.2 C) (Axillary)   Resp (!) 30   Ht 5' 6" (1.676 m)   Wt 84.6 kg   SpO2 98%   BMI 30.10 kg/m   Intake/Output Summary (Last 24 hours) at 07/31/2020 0721 Last data filed at 07/31/2020 0600 Gross per 24 hour  Intake 1641.1 ml  Output 2800 ml  Net -1158.9 ml   Weight change: 3.1 kg  Physical Exam: [id number] ill appearing CVS:s1s2, rrr Resp:trach, bl chest expansion, on full vent support FFM:BWGY Ext:1-2+ diffuse edema Neuro: quadriplegic, opening eyes, looking around but doesn't seem to be intentional interaction  Imaging: No results found.  Labs: BMET Recent Labs  Lab 07/28/20 0009 07/28/20 0317 07/28/20 1954 07/29/20 0503 07/29/20 0645 07/29/20 1122 07/29/20 1419 07/30/20 0211 07/30/20 0540 07/30/20 1054 07/30/20 1408 07/30/20 2024 07/31/20 0014 07/31/20 0512  NA 136 137 135 135 134* 135  --   --  135  --   --   --   --  135  K 5.5* 5.5* 6.1* 6.4* 5.8* 5.4*   < > 5.6* 5.8* 5.5* 3.0* 3.3* 3.2* 3.8  CL 102 103 103 102 102 101  --   --  101  --   --   --   --  96*  CO2 _0 21* 23 19*  --   --  20*  --   --   --   --  24  GLUCOSE 123* 110* 178* 212* 347* 283*  --   --  339*  --   --   --   --  145*  BUN 60* 67* 108* 131* 130* 142*  --   --  165*  --   --   --   --  94*  CREATININE 0.62 0.69 0.95 1.07 1.08 1.15  --   --  1.39*  --   --   --   --  0.87  CALCIUM 8.7* 8.7* 8.4* 8.3* 8.0* 8.3*  --   --  7.8*  --   --   --   --  8.3*  PHOS 6.5* 6.8* 8.5* 9.6*  --  9.7*  --   --  11.1*  --   --   --   --  6.3*   < > = values in this interval not displayed.   CBC Recent Labs  Lab 07/24/20 1009 07/25/20 0319 07/27/20 0354 07/27/20 0728 07/28/20 0317 07/29/20 0503 07/30/20 0540  WBC 40.6*   < > 18.3*  --  22.7* 19.1* 19.5*  NEUTROABS  36.3*  --   --   --   --   --   --   HGB 8.2*   < > 6.6* 10.5* 8.0* 7.5* 7.4*  HCT 26.0*   < > 21.3* 31.0* 24.6* 23.3* 22.5*  MCV 74.5*   < > 76.6*  --  75.9* 75.6* 75.3*  PLT 488*   < > 343  --  324 300 279   < > = values in this interval not displayed.    Medications:     amiodarone  200 mg Per Tube Daily   buPROPion  75 mg Per Tube BID   chlorhexidine gluconate (MEDLINE KIT)  15 mL Mouth Rinse BID   Chlorhexidine Gluconate Cloth  6 each Topical Q1200   Chlorhexidine Gluconate Cloth  6 each Topical Q0600   docusate  100 mg Per Tube BID   feeding supplement (PROSource TF)  45 mL Per Tube TID   guaiFENesin  5 mL Per Tube BID   heparin injection (subcutaneous)  5,000 Units Subcutaneous Q8H   hydrocortisone sod succinate (SOLU-CORTEF) inj  100 mg Intravenous Q12H   insulin aspart  0-20 Units Subcutaneous Q4H   insulin aspart  4 Units Subcutaneous Q4H   insulin glargine  20 Units Subcutaneous BID   mouth rinse  15 mL Mouth Rinse 10 times per day   midodrine  20 mg Per Tube Q8H   multivitamin  1 tablet Per Tube QHS   pantoprazole sodium  40 mg Per Tube QHS   polyethylene glycol  17 g Per Tube Daily   sertraline  100 mg Per Tube Daily   sevelamer carbonate  800 mg Per Tube TID WC   sodium chloride flush  10-40 mL Intracatheter Q12H     [facility]

## 2020-07-31 NOTE — Progress Notes (Addendum)
NAME:  Luis Orr, MRN:  [id number], DOB:  [birth year], LOS: 67 ADMISSION DATE:  06/17/2020, CONSULTATION DATE:  07/02/2020 REFERRING MD:  Dr Francia Greaves, EDP CHIEF COMPLAINT:  septic shock    History of Present Illness:  70 yo male from [facility] with hx of C3-C5 compressive myelopathy with functional quadriplegia and chronic trach/vent presented to ED with altered mental status and hypotension with recurrent HCAP.  Has multiple recent admissions for sepsis secondary to various infections including UTIs, pneumonias and bacteremia in setting of MDR Klebsiella, MRSA and pseudomonas. Most recent admission September 2021 in setting of sepsis due to MDR Klebsiella with right ureteral calculus s/p double J ureteral stent placement and treated with meropenem x 10days.  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 Central line placed. Drainage from gastrostomy tube >> CT Abd without fluid collection around gastrostomy tube but with diffuse body wall edema. Echo EF 50-55%, mild LVH  7/6 Renal Replacement Therapy ; Code status changed to partial Code (DNR) 7/7 Transfuse 1U PRBC 7/9 add solu cortef 7/10 off pressors, weaning stress steroids  7/11 Pressors added back on again 7/12 ID consult due to rising WBC, hypothermia, hypotension concern for worsening infection. Vancomycin added. Repeat blood cx collected. 7/15 CRRT stopped   7/17 Restarted on low dose levo. No evidence of new infection. Held off on antibiotics. Nephrology contacted family and decision was made to continue iHD this week to allow additional time for renal recovery. Patient is not a long term iHD candidate  7/18 plans for iHD, tolerated with 2L removed  719 slight drop in hgb to 6.9 will transfuse 1 unit PRBC   Interim History / Subjective:  No acute issues overnight  Tolerated iHD Pressors weaned off by afternoon of 7/18 Objective   Blood pressure 137/78, pulse (!) 59, temperature (!) 97.2 F (36.2 C), temperature source Axillary, resp. rate (!) 30, height '5\' 6"'$  (1.676 m), weight 84.6 kg, SpO2 98 %.    Vent Mode: PRVC FiO2 (%):  [30 %] 30 % Set Rate:  [30 bmp] 30 bmp Vt Set:  [380 mL] 380 mL PEEP:  [5 cmH20] 5 cmH20 Plateau Pressure:  [23 [id number] cmH20] 27 cmH20   Intake/Output Summary (Last 24 hours) at 07/31/2020 0714 Last data filed at 07/31/2020 0600 Gross per 24 hour  Intake 1641.1 ml  Output 2800 ml  Net -1158.9 ml    Filed Weights   07/30/20 1045 07/30/20 1400 07/31/20 0500  Weight: 83.5 kg 81.3 kg 84.6 kg    Physical Exam: General: Chronically ill appearing deconditioned elderly make lying in bed  on mechanical ventilation through trach, in NAD HEENT: 6 cuffed XLT Shiley trach midline, MM pink/moist, PERRL,  Neuro: Opens eyes to verbal stimuli, weak attempts to follow simple commands  CV: s1s2 regular rate and rhythm, no murmur, rubs, or gallops,  PULM:  Clear to ascultation bilaterally, no increased work of breathing, tolerating vent  GI: soft, bowel  sounds active in all 4 quadrants, non-tender, non-distended, tolerating TF Extremities: warm/dry, 3+ pitting edema  Skin: no rashes or lesions  Pertinent labs/images:  CT head 7/15 - No acute intracranial abnormalities  CXR 07/27/20 - Improved airspace opacities compared to 07/18/20.   CT A/P 07/26/20 - Bibasilar consolidation. Bilateral tree in bud. Improved consolidation  and ascites compared to 07/17/20  Bcx 07/24/20 NGTD final  7/18 AM labs pending   Resolved Hospital Problem list   Hyperkalemia Tachycardia Hypotension Thrombocytosis  AKI Septic shock 2nd to HCAP and UTI all present on admission -Acinetobacter in sputum culture from 07/12/20. Completed 14 days of Unasyn for -Of pressors on 4/14. Restarted  levo 7/17.  Assessment & Plan:  Acute on chronic hypoxic and hypercarbic respiratory failure secondary pna and volume overload Tracheostomy and ventilator dependent status. P: Routine trach care  Continue ventilator support with lung protective strategies  Wean PEEP and FiO2 for sats greater than 90%. Head of bed elevated 30 degrees. Follow intermittent chest x-ray and ABG.   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 -CBG elevated in the upper 200's-300's  P: Better controlled with TF coverage  Continue Resistant SSI and Lantus    Transaminitis, likely 2/2 shock liver - Improving  -Differential includes viral hepatitis, drug-induced, hepatic ischemia from shock, adrenal insufficiency. Previous CT on 7/5 showed no focal liver abnormality but noted possible sludge or stone in gallbladder without intra or extra-hepatic biliary dilation.  P: Repeat LFT 7/20 Avoid hepatotoxins  Supportive care   AKI with mild hyperkalemia Anasarca -Baseline creatinine 0.4, AKI felt secondary to ATN in the setting of sepsis  -Nephrology contacted family and decision was made to continue iHD this week to allow additional time for renal recovery. Patient is not a long term iHD candidate  P: Nephrology following, appreciate assistance  Repeat iHD per nephrology  Follow renal  function  Monitor urine output Trend Bmet Avoid nephrotoxins Ensure adequate renal perfusion   Anemia of critical illness, chronic disease and iron deficiency:  -S/p 1U PRBC transfusion on 7/02 and 7/07 and 7/15; holding iron supplementation in setting of infection -No s/sx active bleed P: Transfuse per protocol  Trend CBC  Monitor for signs of bleeding   Severe protein calorie malnutrition (POA) P: Continue TF Supplement protein   Paroxysmal atrial fibrillation. P: Remains on PO Amio with good rate control Continuous telemetry   Sacral decubitus ulcer stage IV (POA) P: Frequent turns  Pressure alleviating devices  Wound care  Acute metabolic encephalopathy  Hypoactive delirium Depression -CT head 7/15 - neg P: Maintain neuro protective measures Nutrition and bowel regiment  Aspirations precautions  Remains on Wellbutrin and Sertraline   Goals of Care - DNR in event of cardiac arrest P: Continue to engage with family to determine best GOC moving forward    Best Practice    Diet/type: tubefeeds DVT prophylaxis: prophylactic heparin  GI prophylaxis: PPI Lines: Dialysis Catheter and yes and it is still needed Foley:  Yes, and it is still needed. Chronic foley due to quadriplegia. Exchanged 7/1 Code Status:  limited Disposition: likely back to [facility] once he is off CRRT and tolerating HD  Critical care:    Performed by:  Lunell Robart D. Harris  Total critical care time: 38 minutes  Critical care time was exclusive of separately billable procedures and treating other patients.  Critical care was necessary to treat or prevent imminent or life-threatening deterioration.  Critical care was time spent personally by me on the following activities: development of treatment plan with patient and/or surrogate as well as nursing, discussions with consultants, evaluation of patient's response to treatment, examination of patient, obtaining history from patient or  surrogate, ordering and performing treatments and interventions, ordering and review of laboratory studies, ordering and review of radiographic studies, pulse oximetry and re-evaluation of patient's condition.  Avraham Benish D. Kenton Kingfisher, NP-C [facility] Personal contact information can be found on Amion  07/31/2020, 7:14 AM

## 2020-08-01 DIAGNOSIS — Z515 Encounter for palliative care: Secondary | ICD-10-CM | POA: Diagnosis not present

## 2020-08-01 DIAGNOSIS — Z992 Dependence on renal dialysis: Secondary | ICD-10-CM

## 2020-08-01 DIAGNOSIS — Z7189 Other specified counseling: Secondary | ICD-10-CM | POA: Diagnosis not present

## 2020-08-01 DIAGNOSIS — N179 Acute kidney failure, unspecified: Secondary | ICD-10-CM | POA: Diagnosis not present

## 2020-08-01 DIAGNOSIS — J9601 Acute respiratory failure with hypoxia: Secondary | ICD-10-CM | POA: Diagnosis not present

## 2020-08-01 LAB — HEPATIC FUNCTION PANEL
ALT: 69 U/L — ABNORMAL HIGH (ref 0–44)
AST: 60 U/L — ABNORMAL HIGH (ref 15–41)
Albumin: 2.2 g/dL — ABNORMAL LOW (ref 3.5–5.0)
Alkaline Phosphatase: 254 U/L — ABNORMAL HIGH (ref 38–126)
Bilirubin, Direct: 0.2 mg/dL (ref 0.0–0.2)
Indirect Bilirubin: 0.8 mg/dL (ref 0.3–0.9)
Total Bilirubin: 1 mg/dL (ref 0.3–1.2)
Total Protein: 6.2 g/dL — ABNORMAL LOW (ref 6.5–8.1)

## 2020-08-01 LAB — CBC
HCT: 24.9 % — ABNORMAL LOW (ref 39.0–52.0)
Hemoglobin: 8.5 g/dL — ABNORMAL LOW (ref 13.0–17.0)
MCH: 25.8 pg — ABNORMAL LOW (ref 26.0–34.0)
MCHC: 34.1 g/dL (ref 30.0–36.0)
MCV: 75.5 fL — ABNORMAL LOW (ref 80.0–100.0)
Platelets: 231 10*3/uL (ref 150–400)
RBC: 3.3 MIL/uL — ABNORMAL LOW (ref 4.22–5.81)
RDW: 28.7 % — ABNORMAL HIGH (ref 11.5–15.5)
WBC: 13.1 10*3/uL — ABNORMAL HIGH (ref 4.0–10.5)
nRBC: 6.2 % — ABNORMAL HIGH (ref 0.0–0.2)

## 2020-08-01 LAB — TYPE AND SCREEN
ABO/RH(D): O POS
Antibody Screen: NEGATIVE
Unit division: 0

## 2020-08-01 LAB — GLUCOSE, CAPILLARY
Glucose-Capillary: 114 mg/dL — ABNORMAL HIGH (ref 70–99)
Glucose-Capillary: 114 mg/dL — ABNORMAL HIGH (ref 70–99)
Glucose-Capillary: 116 mg/dL — ABNORMAL HIGH (ref 70–99)
Glucose-Capillary: 136 mg/dL — ABNORMAL HIGH (ref 70–99)
Glucose-Capillary: 63 mg/dL — ABNORMAL LOW (ref 70–99)
Glucose-Capillary: 69 mg/dL — ABNORMAL LOW (ref 70–99)
Glucose-Capillary: 75 mg/dL (ref 70–99)
Glucose-Capillary: 99 mg/dL (ref 70–99)

## 2020-08-01 LAB — RENAL FUNCTION PANEL
Albumin: 2.2 g/dL — ABNORMAL LOW (ref 3.5–5.0)
Anion gap: 14 (ref 5–15)
BUN: 131 mg/dL — ABNORMAL HIGH (ref 8–23)
CO2: 22 mmol/L (ref 22–32)
Calcium: 8.3 mg/dL — ABNORMAL LOW (ref 8.9–10.3)
Chloride: 96 mmol/L — ABNORMAL LOW (ref 98–111)
Creatinine, Ser: 1.26 mg/dL — ABNORMAL HIGH (ref 0.61–1.24)
GFR, Estimated: >60 mL/min (ref >60)
Glucose, Bld: 120 mg/dL — ABNORMAL HIGH (ref 70–99)
Phosphorus: 7.6 mg/dL — ABNORMAL HIGH (ref 2.5–4.6)
Potassium: 3.3 mmol/L — ABNORMAL LOW (ref 3.5–5.1)
Sodium: 132 mmol/L — ABNORMAL LOW (ref 135–145)

## 2020-08-01 LAB — BPAM RBC
Blood Product Expiration Date: 202208212359
ISSUE DATE / TIME: 202207191430
Unit Type and Rh: 5100

## 2020-08-01 LAB — MAGNESIUM: Magnesium: 1.9 mg/dL (ref 1.7–2.4)

## 2020-08-01 MED ORDER — INSULIN GLARGINE 100 UNIT/ML ~~LOC~~ SOLN
10.0000 [IU] | Freq: Two times a day (BID) | SUBCUTANEOUS | Status: DC
  Administered 2020-08-01 – 2020-08-02 (×3): 10 [IU] via SUBCUTANEOUS
  Filled 2020-08-01 (×6): qty 0.1

## 2020-08-01 MED ORDER — DARBEPOETIN ALFA 150 MCG/0.3ML IJ SOSY
150.0000 ug | PREFILLED_SYRINGE | INTRAMUSCULAR | Status: DC
  Administered 2020-08-01: 150 ug via SUBCUTANEOUS
  Filled 2020-08-01: qty 0.3

## 2020-08-01 NOTE — Progress Notes (Signed)
NAME:  Luis Orr, MRN:  [id number], DOB:  November 29, 1950, LOS: 21 ADMISSION DATE:  06/16/2020, CONSULTATION DATE:  07/01/2020 REFERRING MD:  Dr Francia Greaves, EDP CHIEF COMPLAINT:  septic shock    History of Present Illness:  70 yo male from [facility] with hx of C3-C5 compressive myelopathy with functional quadriplegia and chronic trach/vent presented to ED with altered mental status and hypotension with recurrent HCAP.  Has multiple recent admissions for sepsis secondary to various infections including UTIs, pneumonias and bacteremia in setting of MDR Klebsiella, MRSA and pseudomonas. Most recent admission September 2021 in setting of sepsis due to MDR Klebsiella with right ureteral calculus s/p double J ureteral stent placement and treated with meropenem x 10days.  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 Central line placed. Drainage from gastrostomy tube >> CT Abd without fluid collection around gastrostomy tube but with diffuse body wall edema. Echo EF 50-55%, mild LVH  7/6 Renal Replacement Therapy ; Code status changed to partial Code (DNR) 7/7 Transfuse 1U PRBC 7/9 add solu cortef 7/10 off pressors, weaning stress steroids  7/11 Pressors added back on again 7/12 ID consult due to rising WBC, hypothermia, hypotension concern for worsening infection. Vancomycin added. Repeat blood cx collected. 7/15 CRRT stopped   7/17 Restarted on low dose levo. No evidence of new infection. Held off on antibiotics. Nephrology contacted family and decision was made to continue iHD this week to allow additional time for renal recovery. Patient is not a long term iHD candidate  7/18 plans for iHD, tolerated with 2L removed  719 slight drop in hgb to 6.9 will transfuse 1 unit PRBC   Interim History / Subjective:  No acute events overnight  59m of urine produced over the last 24hrs Remains off pressors   Objective   Blood pressure (!) 141/70, pulse (!) 54, temperature (!) 96.8 F (36 C), temperature source Axillary, resp. rate (!) 27, height '5\' 6"'$  (1.676 m), weight 84.6 kg, SpO2 99 %.    Vent Mode: PRVC FiO2 (%):  [30 %] 30 % Set Rate:  [30 bmp] 30 bmp Vt Set:  [380 mL] 380 mL PEEP:  [5 cmH20] 5 cmH20 Plateau Pressure:  [22 cmH20-29 cmH20] 27 cmH20   Intake/Output Summary (Last 24 hours) at 08/01/2020 0726 Last data filed at 08/01/2020 0400 Gross per 24 hour  Intake 1129 ml  Output 333 ml  Net 796 ml    Filed Weights   07/30/20 1045 07/30/20 1400 07/31/20 0500  Weight: 83.5 kg 81.3 kg 84.6 kg    Physical Exam: General: Chronically ill appearing very deconditioned elderly male lying in bed on mechanical ventilation through trach, in NAD HEENT: ETT6 cuffed shile trach midline,  MM pink/moist, PERRL,  Neuro: Alert, will track with eyes but unable to follow commands CV: s1s2 regular rate and rhythm, no murmur, rubs, or gallops,  PULM:  Clear to ascultation bilaterally, no increased work of breathing, no added breath  sounds GI: soft, bowel sounds active in all 4 quadrants, non-tender, non-distended, tolerating TF Extremities: warm/dry, 2+ edema  Skin: no rashes or lesions   Pertinent labs/images:  CT head 7/15 - No acute intracranial abnormalities  CXR 07/27/20 - Improved airspace opacities compared to 07/18/20.   CT A/P 07/26/20 - Bibasilar consolidation. Bilateral tree in bud. Improved consolidation and  ascites compared to 07/17/20  Bcx 07/24/20 NGTD final  7/18 AM labs pending   Resolved Hospital Problem list   Hyperkalemia Tachycardia Hypotension Thrombocytosis  AKI Septic shock 2nd to HCAP and UTI all present on admission -Acinetobacter in sputum culture from 07/12/20. Completed 14 days of Unasyn for. Of pressors on 4/14. Restarted  levo 7/17.  Assessment & Plan:  Acute on chronic hypoxic and hypercarbic respiratory failure secondary pna and volume overload Tracheostomy and ventilator dependent status. P: Routine trach care Continue ventilator support with lung protective strategies  Head of bed elevated 30 degrees. 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 -CBG elevated in the upper 200's-300's  P: CBG downtrending with decrease steroids Decrease Lantus  Continue SSI and TF coverage   Transaminitis, likely 2/2 shock liver - Improving  -Differential includes viral hepatitis, drug-induced, hepatic ischemia from shock, adrenal insufficiency. Previous CT on 7/5 showed no focal liver abnormality but noted possible sludge or stone in gallbladder without intra or extra-hepatic biliary dilation.  P: Avoid hepatotoxins  Supportive care   AKI with mild hyperkalemia Anasarca -Baseline creatinine 0.4, AKI felt secondary to ATN in the setting of sepsis  -Nephrology contacted family and decision was made to continue iHD this week to allow additional time for renal recovery. Patient is not a long term iHD candidate  P: Nephrology following, appreciate assistance  Repeat iHD today  Follow renal function  Monitor urine output Trend Bmet Avoid nephrotoxins Ensure adequate renal perfusion   Anemia of critical illness, chronic disease and iron deficiency:  -S/p 1U PRBC  transfusion on 7/02 and 7/07 and 7/15; holding iron supplementation in setting of infection -No s/sx active bleed P: Repeat CBC this AM pending  Transfuse per protocol  Trend CBC   Severe protein calorie malnutrition (POA) P: Continue TF Protein supplementation   Paroxysmal atrial fibrillation. P: Remains on PO Amio with good rate control Continuous telemetry   Sacral decubitus ulcer stage IV (POA) P: Frequent pain control  Pressure alleviating devices  Wound care   Acute metabolic encephalopathy  Hypoactive delirium Depression -CT head 7/15 - neg P:  Maintain neuro protective measures; Nutrition and bowel regiment  AEDs per neurology  Aspirations precautions  Continue Welbutrin and Sertraline   Goals of Care - DNR in event of cardiac arrest P: Continue conversation with full care team to determine [place]    Diet/type: tubefeeds DVT prophylaxis: prophylactic heparin  GI prophylaxis: PPI Lines: Dialysis Catheter and yes and it is still needed Foley:  Yes, and it is still needed. Chronic foley due to quadriplegia. Exchanged 7/1 Code Status:  limited Disposition: likely back to [facility] once he is off CRRT and tolerating HD  Critical care:    Performed by: Asaf Elmquist D. Harris  Total critical care time: 37 minutes  Critical care time was exclusive of separately billable procedures and treating other patients.  Critical care was necessary to treat  or prevent imminent or life-threatening deterioration.  Critical care was time spent personally by me on the following activities: development of treatment plan with patient and/or surrogate as well as nursing, discussions with consultants, evaluation of patient's response to treatment, examination of patient, obtaining history from patient or surrogate, ordering and performing treatments and interventions, ordering and review of laboratory studies, ordering and review of radiographic studies, pulse oximetry  and re-evaluation of patient's condition.  Georga Stys D. Kenton Kingfisher, NP-C [facility] Personal contact information can be found on Amion  08/01/2020, 7:26 AM

## 2020-08-01 NOTE — Progress Notes (Signed)
Hypoglycemic Event  CBG: 69  Treatment: 4 oz juice/soda and restarted tube feeds  Symptoms: None  Follow-up CBG: Time:1642 CBG Result:75  Possible Reasons for Event: Other: tube feeds stopped while getting new bottle and tubing.    Comments/MD notified:Dr. Mannam aware.      Luis Orr

## 2020-08-01 NOTE — Progress Notes (Signed)
[facility] Progress Note    Assessment/ Plan:   AKI -baseline Cr 0.4. AKI due to ATN in the context of shock. CRRT initiated on 7/6 for anasarca with ineffective diuresis and uremia.  CCM for rij temp line placement 37/80- 38 days old - CRRT 7/6 - 7/15, now off due to improved hemodynamics -Not a good long term candidate for HD due to poor functional status, vent dependency. Dr. Candiss Norse voiced this concern to both son and wife over the phone on 7/6 and they had wished to still proceed forward with renal replacement therapy. Appreciate palliative care's assistance.  Dr. Johnney Ou discussed concern for long term dialysis to wife and son 7/12 and again on 7/17.  The plan is continue iHD through this week given he's out of shock phase but if no signs of recovery then reconvene about GOC if he will be vent and dialysis dependent.  He already has been living at [facility] for several years prior to this admission.  Palliative care is involved which is appreciated.   Plan is for IHD  today as minimal UOP and bun climbing  Hyperkalemia shifted and lokelma given PRN, HD today 2K. Is good today again at 3.8  CKD-MBD, hyperphosphatemia -phos had been ok with CRRT, then up, on nepro, started renvela  Acute metabolic encephalopathy, likely related to sepsis and uremia:   CT head unrevealing, per primary  Septic shock:  resolved -off pressors now -ID following -off abx as of 7/15  Chronic respiratory failure s/p trach -vent mgmt per ccm  Quadriplegia 2/2 compressive myelopathy -resides at [facility] -appreciate palliative care's assistance  Microcytic anemia -transfuse for hgb <7. give iron load as severely iron deficient on 6/30 labs (t sat 5%) -hgb 8.4 > 7.7 > 6.6 transfusion 8 > 7.5.....6.9  on 7/19-  given blood. Added ESA  Dispo:  with comorbids not a good long term dialysis candidate.  Family wish to continue current care.  Next step will be to see if he tolerates iHD.   He has done  that -  continuing HD this week with old non tunneled cath then if still HD req to reassess.  He was already living at [facility] with minimal QOL so adding dialysis in will not be that much different and they have the capability to do there  Subjective:   Hypothermic-  no uop really recorded  -  hemodynamically stable -  BUN/crt up.  He is alert-  think he understands what I am saying   Objective:   BP (!) 141/70   Pulse (!) 54   Temp (!) 96.8 F (36 C) (Axillary)   Resp (!) 27   Ht 5' 6"  (1.676 m)   Wt 84.6 kg   SpO2 99%   BMI 30.10 kg/m   Intake/Output Summary (Last 24 hours) at 08/01/2020 0701 Last data filed at 08/01/2020 0400 Gross per 24 hour  Intake 1129 ml  Output 333 ml  Net 796 ml   Weight change:   Physical Exam: [id number] ill appearing-  eyes open-  seems to understand what I am saying  CVS:s1s2, rrr Resp:trach, bl chest expansion, on full vent support PIR:JJOA Ext:1-2+ diffuse edema Neuro: functional quadriplegic, opening eyes, looking around   Imaging: No results found.  Labs: BMET Recent Labs  Lab 07/28/20 0317 07/28/20 1954 07/29/20 0503 07/29/20 0645 07/29/20 1122 07/29/20 1419 07/30/20 0540 07/30/20 1054 07/30/20 1408 07/30/20 2024 07/31/20 0014 07/31/20 0512 08/01/20 0440  NA 137 135 135 134* 135  --  135  --   --   --   --  135 132*  K 5.5* 6.1* 6.4* 5.8* 5.4*   < > 5.8* 5.5* 3.0* 3.3* 3.2* 3.8 3.3*  CL 103 103 102 102 101  --  101  --   --   --   --  96* 96*  CO2 24 24 21* 23 19*  --  20*  --   --   --   --  24 22  GLUCOSE 110* 178* 212* 347* 283*  --  339*  --   --   --   --  145* 120*  BUN 67* 108* 131* 130* 142*  --  165*  --   --   --   --  94* 131*  CREATININE 0.69 0.95 1.07 1.08 1.15  --  1.39*  --   --   --   --  0.87 1.26*  CALCIUM 8.7* 8.4* 8.3* 8.0* 8.3*  --  7.8*  --   --   --   --  8.3* 8.3*  PHOS 6.8* 8.5* 9.6*  --  9.7*  --  11.1*  --   --   --   --  6.3* 7.6*   < > = values in this interval not displayed.    CBC Recent Labs  Lab 07/28/20 0317 07/29/20 0503 07/30/20 0540 07/31/20 0512  WBC 22.7* 19.1* 19.5* 11.5*  HGB 8.0* 7.5* 7.4* 6.9*  HCT 24.6* 23.3* 22.5* 21.0*  MCV 75.9* 75.6* 75.3* 75.3*  PLT 324 300 279 246    Medications:     amiodarone  200 mg Per Tube Daily   buPROPion  75 mg Per Tube BID   chlorhexidine gluconate (MEDLINE KIT)  15 mL Mouth Rinse BID   Chlorhexidine Gluconate Cloth  6 each Topical Q1200   Chlorhexidine Gluconate Cloth  6 each Topical Q0600   Chlorhexidine Gluconate Cloth  6 each Topical Q0600   docusate  100 mg Per Tube BID   feeding supplement (PROSource TF)  45 mL Per Tube TID   guaiFENesin  5 mL Per Tube BID   heparin injection (subcutaneous)  5,000 Units Subcutaneous Q8H   hydrocortisone sod succinate (SOLU-CORTEF) inj  100 mg Intravenous Daily   insulin aspart  0-20 Units Subcutaneous Q4H   insulin aspart  4 Units Subcutaneous Q4H   insulin glargine  20 Units Subcutaneous BID   mouth rinse  15 mL Mouth Rinse 10 times per day   midodrine  15 mg Per Tube Q8H   multivitamin  1 tablet Per Tube QHS   pantoprazole sodium  40 mg Per Tube QHS   polyethylene glycol  17 g Per Tube Daily   sertraline  100 mg Per Tube Daily   sevelamer carbonate  800 mg Per Tube TID WC   sodium chloride flush  10-40 mL Intracatheter Q12H     [facility]

## 2020-08-01 NOTE — Progress Notes (Signed)
Daily Progress Note   Patient Name: Luis Orr       Date: 08/01/2020 DOB: 13-Oct-1950  Age: 70 y.o. MRN#: [id number] Attending Physician: Marshell Garfinkel, MD Primary Care Physician: Townsend Roger, MD Admit Date: 06/29/2020  Reason for Consultation/Follow-up: Establishing goals of care  Subjective: Chart Reviewed. Patient Assessed. Received updates from nephrology Dr. Moshe Cipro and CCM Luis Fee, NP. RN reports plans for HD later today and no family visit today.   Opens eyes to voice - small attempt to move lips and communicate but unable to understand. No family at the bedside.   Called and spoke with wife. Offered to set up meeting in person but she tells me she is okay speaking on the phone.   She tells me she feels like patient is improving - more interactive with her. I ask her if he is close to his baseline and she tells me "no" - he was much more communicative at [facility] than he is now. She tells me she understands that he has been tolerating HD well.   We discuss his transition from CRRT to intermittent HD. We discuss plan for HD again today d/t low urine output and rising BUN. We discuss that unfortunately Luis Orr continues to need HD and this very much complicates the situation. We discuss that he will not be able to return to [facility] with vent and HD needs. She asks me what we will do and I explain that many times the facilities that accept patients who require vent and HD support are out of state. Luis Orr is understandably very upset by this. She tells me she is not okay with this as she does not think she will be able to make visits to him out of state and she feels like he will decline quickly if he is not visited by his family. She then states "but we have to continue dialysis".   I ask Luis Orr to consider what would be most important to Luis Orr in this situation and what sort of decisions he would make for himself if he were able to communicate with [place].  She tells me she needs to discuss this situation with her sons. I offer to set up a meeting with her and her sons together. She is unable to confirm a meeting at this time. She is agreeable to me calling her again tomorrow for further [place] discussions and agrees to speak to her sons soon regarding the above.   All questions answered support provided.   Length of Stay: 21 days  Vital Signs: BP 123/64   Pulse (!) 56   Temp (!) 97.5 F (36.4 C) (Oral)   Resp 16   Ht '5\' 6"'$  (1.676 m)   Wt 84.6 kg   SpO2 99%   BMI 30.10 kg/m  SpO2: SpO2: 99 % O2 Device: O2 Device: Ventilator O2 Flow Rate: O2 Flow Rate (L/min): 50 L/min  Physical Exam: NAD, chronically ill appearing, opens eyes to voice Remains on vent support via trach Irregularly irregular            Palliative Care Assessment & Plan  HPI: Palliative Care consult requested for goals of care discussion in this 70 y.o. male  with past medical history of functional quadriplegic d/t compressive C3-C5 myelopathy, trach and vent (2018), diabetes, hypertension, anemia, and anxiety. Patient resides at [facility] facility. He was admitted on 06/16/2020 via EMS to ED from [facility] with concerns for hypotension and altered mental status. Per reports at  baseline he is alert and oriented x4. Patient was receiving treatment for pneumonia x1 mos. Patient started on IV antibiotics for sepsis in the setting of UTI. Requiring full ventilator support. 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   Code Status: Limited code  Goals of Care/Recommendations: Unclear at this time - wife to speak with sons tonight and plans to follow up with me via phone tomorrow - in person meeting with wife and sons offered but she was unsure  Prognosis: Guarded   Discharge Planning: To Be Determined  Thank you for allowing the Palliative Medicine Team to assist in the care of this patient.  Time Total: 45 minutes  Greater than 50%  of this time was spent  counseling and coordinating care related to the above assessment and plan.  Juel Burrow, DNP, AGNP-C Palliative Medicine Team Team Phone # [phone]  Pager # [phone]

## 2020-08-02 DIAGNOSIS — Z9911 Dependence on respirator [ventilator] status: Secondary | ICD-10-CM | POA: Diagnosis not present

## 2020-08-02 DIAGNOSIS — A419 Sepsis, unspecified organism: Secondary | ICD-10-CM | POA: Diagnosis not present

## 2020-08-02 DIAGNOSIS — J9601 Acute respiratory failure with hypoxia: Secondary | ICD-10-CM | POA: Diagnosis not present

## 2020-08-02 DIAGNOSIS — Z7189 Other specified counseling: Secondary | ICD-10-CM | POA: Diagnosis not present

## 2020-08-02 DIAGNOSIS — Z515 Encounter for palliative care: Secondary | ICD-10-CM | POA: Diagnosis not present

## 2020-08-02 DIAGNOSIS — N179 Acute kidney failure, unspecified: Secondary | ICD-10-CM | POA: Diagnosis not present

## 2020-08-02 LAB — CBC
HCT: 25.6 % — ABNORMAL LOW (ref 39.0–52.0)
Hemoglobin: 8.8 g/dL — ABNORMAL LOW (ref 13.0–17.0)
MCH: 26.2 pg (ref 26.0–34.0)
MCHC: 34.4 g/dL (ref 30.0–36.0)
MCV: 76.2 fL — ABNORMAL LOW (ref 80.0–100.0)
Platelets: 223 10*3/uL (ref 150–400)
RBC: 3.36 MIL/uL — ABNORMAL LOW (ref 4.22–5.81)
RDW: 28.5 % — ABNORMAL HIGH (ref 11.5–15.5)
WBC: 15.3 10*3/uL — ABNORMAL HIGH (ref 4.0–10.5)
nRBC: 4.4 % — ABNORMAL HIGH (ref 0.0–0.2)

## 2020-08-02 LAB — RENAL FUNCTION PANEL
Albumin: 2.3 g/dL — ABNORMAL LOW (ref 3.5–5.0)
Anion gap: 16 — ABNORMAL HIGH (ref 5–15)
BUN: 157 mg/dL — ABNORMAL HIGH (ref 8–23)
CO2: 22 mmol/L (ref 22–32)
Calcium: 8.5 mg/dL — ABNORMAL LOW (ref 8.9–10.3)
Chloride: 95 mmol/L — ABNORMAL LOW (ref 98–111)
Creatinine, Ser: 1.4 mg/dL — ABNORMAL HIGH (ref 0.61–1.24)
GFR, Estimated: 54 mL/min — ABNORMAL LOW (ref >60)
Glucose, Bld: 120 mg/dL — ABNORMAL HIGH (ref 70–99)
Phosphorus: 8.5 mg/dL — ABNORMAL HIGH (ref 2.5–4.6)
Potassium: 3.8 mmol/L (ref 3.5–5.1)
Sodium: 133 mmol/L — ABNORMAL LOW (ref 135–145)

## 2020-08-02 LAB — GLUCOSE, CAPILLARY
Glucose-Capillary: 112 mg/dL — ABNORMAL HIGH (ref 70–99)
Glucose-Capillary: 139 mg/dL — ABNORMAL HIGH (ref 70–99)
Glucose-Capillary: 73 mg/dL (ref 70–99)
Glucose-Capillary: 92 mg/dL (ref 70–99)
Glucose-Capillary: 93 mg/dL (ref 70–99)
Glucose-Capillary: 95 mg/dL (ref 70–99)

## 2020-08-02 LAB — MAGNESIUM: Magnesium: 2.4 mg/dL (ref 1.7–2.4)

## 2020-08-02 MED ORDER — DARBEPOETIN ALFA 150 MCG/0.3ML IJ SOSY
PREFILLED_SYRINGE | INTRAMUSCULAR | Status: AC
  Filled 2020-08-02: qty 0.3

## 2020-08-02 MED ORDER — INSULIN ASPART 100 UNIT/ML IJ SOLN
2.0000 [IU] | INTRAMUSCULAR | Status: DC
  Administered 2020-08-03 – 2020-08-16 (×78): 2 [IU] via SUBCUTANEOUS

## 2020-08-02 MED ORDER — HEPARIN SODIUM (PORCINE) 1000 UNIT/ML IJ SOLN
INTRAMUSCULAR | Status: AC
  Filled 2020-08-02: qty 4

## 2020-08-02 MED ORDER — DARBEPOETIN ALFA 150 MCG/0.3ML IJ SOSY
150.0000 ug | PREFILLED_SYRINGE | INTRAMUSCULAR | Status: DC
  Administered 2020-08-08: 150 ug via INTRAVENOUS
  Filled 2020-08-02 (×2): qty 0.3

## 2020-08-02 MED ORDER — MIDODRINE HCL 5 MG PO TABS
10.0000 mg | ORAL_TABLET | Freq: Three times a day (TID) | ORAL | Status: DC
  Administered 2020-08-02 – 2020-08-14 (×31): 10 mg
  Filled 2020-08-02 (×34): qty 2

## 2020-08-02 MED ORDER — ALBUMIN HUMAN 25 % IV SOLN
25.0000 g | Freq: Once | INTRAVENOUS | Status: AC
  Administered 2020-08-02: 25 g via INTRAVENOUS

## 2020-08-02 NOTE — TOC Progression Note (Addendum)
Transition of Care [facility]) - Progression Note    Patient Details  Name: Luis Orr MRN: [id number] Date of Birth: 06-Apr-1950  Transition of Care [facility]) CM/SW Contact  Sharin Mons, RN Phone Number: 08/02/2020, 4:14 PM  Clinical Narrative:     Pt can't return to [facility] Vent /SNF 2/2 HD need. Raquel Sarna [facility] will review and consider for LTAC placement . Raquel Sarna to f/u with an answer in the am.  [facility] team will continue to monitor  and assist with searching for placement.   08/03/2020 @ 11:43 am NCM spoke with Emily/[facility]. Raquel Sarna informed NCM [facility] TRACH/VENT/HD beds are full, no bed availability.   TOC team will continue to search for Vent. SNF /HD  bed.  Expected Discharge Plan:  (Vent. SNF/ HD) Barriers to Discharge: Continued Medical Work up  Expected Discharge Plan and Services Expected Discharge Plan:  (Vent. SNF/ HD)       Living arrangements for the past 2 months: Post-Acute Facility                                       Social Determinants of Health (SDOH) Interventions    Readmission Risk Interventions No flowsheet data found.

## 2020-08-02 NOTE — Progress Notes (Signed)
Assisted tele visit to patient with wife.  Thomas, Waino Mounsey Renee, RN   

## 2020-08-02 NOTE — Progress Notes (Signed)
PROGRESS NOTE    AHMERE Orr  [id number] DOB: 1950-10-14 DOA: 07/10/2020 PCP: Townsend Roger, MD   Brief Narrative: Luis Orr is a 70 y.o. 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 Patient presented secondary to altered mental status and hypotension with recurrent pneumonia requiring vasopressor support and ICU admission.   ICU significant events: 6/29 admitted to ICU for septic shock on levo to maintain MAP>65, continued on vent support 6/29 Blood Cx>> staph species in 1 of 4 cultures drawn>> suspect contaminant 6/30 Off pressors 7/2 ID Consult for MDR acinetobacter in trach asp, Meropenem changed to unasyn 7/2 PRBC transfusion, iron replacement 7/5 Pressors added again. Central line placed. Drainage from gastrostomy tube >> CT Abd without fluid collection around gastrostomy tube but with diffuse body wall edema. Echo EF 50-55%, mild LVH 7/6 Renal Replacement Therapy ; Code status changed to partial Code (DNR) 7/7 Transfuse 1U PRBC 7/9 add solu cortef 7/10 off pressors, weaning stress steroids 7/11 Pressors added back on again 7/12 ID consult due to rising WBC, hypothermia, hypotension concern for worsening infection. Vancomycin added. Repeat blood cx collected. 7/15 CRRT stopped 7/17 Restarted on low dose levo. No evidence of new infection. Held off on antibiotics. Nephrology contacted family and decision was made to continue iHD this week to allow additional time for renal recovery. Patient is not a long term iHD candidate 7/18 plans for iHD, tolerated with 2L removed 719 slight drop in hgb to 6.9 will transfuse 1 unit PRBC   Assessment & Plan:   Active Problems:   Septic shock (HCC)   Pressure injury of skin   Acute respiratory failure with hypoxia (HCC)   AKI (acute kidney injury) ([place])   Hypotension   Acute on chronic respiratory failure with hypoxia and hypercapnia Secondary to  pneumonia/volume overload. Now resolved and back to baseline. Currently on ventilator support per PCCM.  Ventilator associated pneumonia Present on admission. Sputum culture significant for acinetobacter baumannii. Patient was treated with Unasyn x14 days. Completed course.  Leukocytosis Secondary to above infection and sepsis. Significantly improved and stable.  Volume overload Secondary to renal failure with anuria. Patient has improved with CRRT and now iHD.  AKI with oliguria Now appears patient is anuric. No documented urine output from last 24 hours. Nephrology is on board and was initially managing with CRRT and have transitioned to intermittent HD, however patient is not a long term iHD candidate. Palliative care has been consulted.  Septic shock Present on admission. Secondary to pneumonia. Patient required vasopressor support which is now weaned off. Resolved. Initial blood culture significant for likely contaminant. Repeat blood cultures negative.  Acute metabolic encephalopathy Secondary to acute illness. CT head negative for acute process. Appears improved and likely back at baseline.  Diabetes mellitus, type 2 Insulin needs have decreased. 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 Improving.  Quadriplegia Secondary to compressive myelopathy. Patient resides at [facility] but per chart review, [facility] will not accept him back if he is HD dependent.  Microcytic anemia Acute anemia Patient required 4 units of PRBC to date. Thought to be secondary to a combination of acute illness, chronic disease and iron deficiency. IV iron ordered today. -CBC in AM  Severe malnutrition -Continue tube feeds  Paroxysmal atrial fibrillation -Continue amiodarone  Pressure injury Medial/posterior  sacrum present on admission.  Right/posterior/lateral ankle unknown if present on admission. Left ear not present on admission.   DVT prophylaxis: Heparin Code Status:   Code Status: Partial Code Family Communication: Called wife, but no response; voice mail left. Disposition Plan: Discharge uncertain. Appropriate for discharge back to LTAC, but apparently they will not take him while on HD   Consultants:  PCCM Nephrology Infectious disease Palliative care medicine  Procedures:    Antimicrobials: Unasyn IV Vancomycin Meropenem Cefepime   Subjective: Patient is non-verbal  Objective: Vitals:   08/02/20 1200 08/02/20 1243 08/02/20 1300 08/02/20 1458  BP: 119/81 119/81 119/72   Pulse: (!) 55 (!) 58 (!) 57   Resp: (!) 30 (!) 30 (!) 30   Temp:    (!) 97.5 F (36.4 C)  TempSrc:    Oral  SpO2: 99% 98% 98%   Weight:      Height:        Intake/Output Summary (Last 24 hours) at 08/02/2020 1517 Last data filed at 08/02/2020 1400 Gross per 24 hour  Intake 1009.21 ml  Output 335 ml  Net 674.21 ml   Filed Weights   07/30/20 1045 07/30/20 1400 07/31/20 0500  Weight: 83.5 kg 81.3 kg 84.6 kg    Examination:  General exam: Appears calm and comfortable and in no acute distress. Unable to converse Respiratory: Clear to auscultation. Respiratory effort normal with no intercostal retractions or use of accessory muscles Cardiovascular: S1 & S2 heard, RRR. No murmurs, rubs, gallops or clicks. Significant bilateral upper extremity edema. 1+ BLE edema Gastrointestinal: Abdomen is non-distended, soft and non-tender. No masses felt. Normal bowel sounds heard Neurologic: quadriplegia  Musculoskeletal: No calf tenderness Skin: No cyanosis. No new rashes Psychiatry: Alert.    Data Reviewed: I have personally reviewed following labs and imaging studies  CBC Lab Results  Component Value Date   WBC 15.3 (H) 08/02/2020   RBC 3.36 (L) 08/02/2020   HGB 8.8 (L) 08/02/2020   HCT 25.6 (L) 08/02/2020   MCV 76.2  (L) 08/02/2020   MCH 26.2 08/02/2020   PLT 223 08/02/2020   MCHC 34.4 08/02/2020   RDW 28.5 (H) 08/02/2020   LYMPHSABS 1.3 07/24/2020   MONOABS 1.7 (H) 07/24/2020   EOSABS 0.0 07/24/2020   BASOSABS 0.1 64/40/3474     Last metabolic panel Lab Results  Component Value Date   NA 133 (L) 08/02/2020   K 3.8 08/02/2020   CL 95 (L) 08/02/2020   CO2 22 08/02/2020   BUN 157 (H) 08/02/2020   CREATININE 1.40 (H) 08/02/2020   GLUCOSE 120 (H) 08/02/2020   GFRNONAA 54 (L) 08/02/2020   GFRAA NOT CALCULATED 10/11/2019   CALCIUM 8.5 (L) 08/02/2020   PHOS 8.5 (H) 08/02/2020   PROT 6.2 (L) 08/01/2020   ALBUMIN 2.3 (L) 08/02/2020   BILITOT 1.0 08/01/2020   ALKPHOS 254 (H) 08/01/2020   AST 60 (H) 08/01/2020   ALT 69 (H) 08/01/2020   ANIONGAP 16 (H) 08/02/2020    CBG (last 3)  Recent Labs    08/02/20 0700 08/02/20 1135 08/02/20 1456  GLUCAP 93 73 92     GFR: Estimated Creatinine Clearance: 50.1 mL/min (A) (by C-G formula based on SCr of 1.4 mg/dL (H)).  Coagulation Profile: No results for input(s): INR, PROTIME in the last 168 hours.  Recent Results (from the past 240 hour(s))  Culture, blood (routine x 2)     Status: None   Collection Time: 07/24/20  3:53 PM   Specimen: BLOOD  RIGHT HAND  Result Value Ref Range Status   Specimen Description BLOOD RIGHT HAND  Final   Special Requests   Final    BOTTLES DRAWN AEROBIC AND ANAEROBIC Blood Culture results may not be optimal due to an inadequate volume of blood received in culture bottles   Culture   Final    NO GROWTH 5 DAYS Performed at [facility], [place] [address] [place], [place] [address]    Report Status 07/29/2020 FINAL  Final  Culture, blood (routine x 2)     Status: None   Collection Time: 07/24/20  4:10 PM   Specimen: BLOOD LEFT HAND  Result Value Ref Range Status   Specimen Description BLOOD LEFT HAND  Final   Special Requests   Final    BOTTLES DRAWN AEROBIC AND ANAEROBIC Blood Culture adequate volume    Culture   Final    NO GROWTH 5 DAYS Performed at [facility], [place] [address], [place], [place] [address]    Report Status 07/29/2020 FINAL  Final        Radiology Studies: No results found.      Scheduled Meds:  amiodarone  200 mg Per Tube Daily   buPROPion  75 mg Per Tube BID   chlorhexidine gluconate (MEDLINE KIT)  15 mL Mouth Rinse BID   Chlorhexidine Gluconate Cloth  6 each Topical Q1200   [START ON 08/08/2020] darbepoetin (ARANESP) injection - DIALYSIS  150 mcg Intravenous Q Wed-HD   docusate  100 mg Per Tube BID   feeding supplement (PROSource TF)  45 mL Per Tube TID   guaiFENesin  5 mL Per Tube BID   heparin injection (subcutaneous)  5,000 Units Subcutaneous Q8H   insulin aspart  2 Units Subcutaneous Q4H   insulin glargine  10 Units Subcutaneous BID   mouth rinse  15 mL Mouth Rinse 10 times per day   midodrine  10 mg Per Tube TID   multivitamin  1 tablet Per Tube QHS   pantoprazole sodium  40 mg Per Tube QHS   polyethylene glycol  17 g Per Tube Daily   sertraline  100 mg Per Tube Daily   sodium chloride flush  10-40 mL Intracatheter Q12H   Continuous Infusions:  sodium chloride Stopped (07/26/20 0824)   sodium chloride 250 mL (07/31/20 1428)   sodium chloride     sodium chloride     sodium chloride     feeding supplement (NEPRO CARB STEADY) 45 mL/hr at 08/02/20 0800     LOS: 22 days     Cordelia Poche, MD [facility] 08/02/2020, 3:17 PM  If 7PM-7AM, please contact night-coverage [email or web address]

## 2020-08-02 NOTE — Progress Notes (Signed)
[facility] Progress Note    Assessment/ Plan:   AKI -baseline Cr 0.4. AKI due to ATN in the context of shock. CRRT initiated on 7/6 for anasarca with ineffective diuresis and uremia.  CCM for rij temp line placement 55/86- 41 days old - CRRT 7/6 - 7/15, now off due to improved hemodynamics -Not a good long term candidate for HD due to poor functional status, vent dependency. Dr. Candiss Norse voiced this concern to both son and wife over the phone on 7/6 and they had wished to still proceed forward with renal replacement therapy. Appreciate palliative care's assistance.  Dr. Johnney Ou discussed concern for long term dialysis to wife and son 7/12 and again on 7/17.  The plan is continue iHD through this week given he's out of shock phase but if no signs of recovery then reconvene about GOC if he will be vent and dialysis dependent.  He already has been living at [facility] for several years prior to this admission.  Palliative care is involved which is appreciated-  they met with family yesterday and family not willing to stop dialysis at this point.   Plan is for IHD  today (put off from yest) as minimal UOP and bun climbing-  will likely need HD again on Saturday   Hyperkalemia shifted and lokelma given PRN, HD today 2K. Is good today again   CKD-MBD, hyperphosphatemia -phos had been ok with CRRT, then up, on nepro, started renvela-  have stopped as is not usually effective in this situation and least of our concerns  Acute metabolic encephalopathy, likely related to sepsis and uremia:   CT head unrevealing, per primary  Septic shock:  resolved -off pressors now -ID following -off abx as of 7/15  Chronic respiratory failure s/p trach -vent mgmt per ccm  Quadriplegia 2/2 compressive myelopathy -resides at [facility] -appreciate palliative care's assistance-  [facility] will not take him back if he is dialysis dependent  Microcytic anemia -transfuse for hgb <7. give iron load as severely  iron deficient on 6/30 labs (t sat 5%) -hgb 8.4 > 7.7 > 6.6 transfusion 8 > 7.5.....6.9  on 7/19-  given blood. Added ESA  Dispo:  with comorbids not a good long term dialysis candidate.  Family wish to continue current care.   He is technically tolerating IHD -  continuing HD this week with old non tunneled cath then if still HD req to reassess.  Based on previous conversations I think family will want to continue with HD-  finding a place for him to go post hosp will be very difficult - likely req out of state facility-  that may be the only card we can play to get the family to realize this is not the best idea for him   Subjective:   Hypothermic-  no uop really recorded  -  hemodynamically stable - labs have not been resulted yet this AM-  ended up NOT getting HD yesterday or overnight due to staffing issues-  plan is for him to get today when we can.  Palliative care conversation noted.  I suspected that they would not be on board with stopping dialysis.  The situation is more complex as I had thought [facility] would take him back since he was a long term resident there but if they wont b/c now on dialysis it is true that dispo would be a facility out of state (and not easy to place there by any means)   Objective:   BP  126/64   Pulse (!) 56   Temp (!) 96.7 F (35.9 C) (Axillary)   Resp (!) 27   Ht 5' 6"  (1.676 m)   Wt 84.6 kg   SpO2 98%   BMI 30.10 kg/m   Intake/Output Summary (Last 24 hours) at 08/02/2020 0646 Last data filed at 08/02/2020 0500 Gross per 24 hour  Intake 1651.75 ml  Output 110 ml  Net 1541.75 ml   Weight change:   Physical Exam: [id number] ill appearing-  eyes open-  seems to understand what I am saying  CVS:s1s2, rrr Resp:trach, bl chest expansion, on full vent support OIZ:TIWP Ext:  2+ diffuse edema Neuro: functional quadriplegic, opening eyes, looking around   Imaging: No results found.  Labs: BMET Recent Labs  Lab 07/28/20 0317 07/28/20 1954  07/29/20 0503 07/29/20 0645 07/29/20 1122 07/29/20 1419 07/30/20 0540 07/30/20 1054 07/30/20 1408 07/30/20 2024 07/31/20 0014 07/31/20 0512 08/01/20 0440  NA 137 135 135 134* 135  --  135  --   --   --   --  135 132*  K 5.5* 6.1* 6.4* 5.8* 5.4*   < > 5.8* 5.5* 3.0* 3.3* 3.2* 3.8 3.3*  CL 103 103 102 102 101  --  101  --   --   --   --  96* 96*  CO2 24 24 21* 23 19*  --  20*  --   --   --   --  24 22  GLUCOSE 110* 178* 212* 347* 283*  --  339*  --   --   --   --  145* 120*  BUN 67* 108* 131* 130* 142*  --  165*  --   --   --   --  94* 131*  CREATININE 0.69 0.95 1.07 1.08 1.15  --  1.39*  --   --   --   --  0.87 1.26*  CALCIUM 8.7* 8.4* 8.3* 8.0* 8.3*  --  7.8*  --   --   --   --  8.3* 8.3*  PHOS 6.8* 8.5* 9.6*  --  9.7*  --  11.1*  --   --   --   --  6.3* 7.6*   < > = values in this interval not displayed.   CBC Recent Labs  Lab 07/29/20 0503 07/30/20 0540 07/31/20 0512 08/01/20 0440  WBC 19.1* 19.5* 11.5* 13.1*  HGB 7.5* 7.4* 6.9* 8.5*  HCT 23.3* 22.5* 21.0* 24.9*  MCV 75.6* 75.3* 75.3* 75.5*  PLT 300 279 246 231    Medications:     amiodarone  200 mg Per Tube Daily   buPROPion  75 mg Per Tube BID   chlorhexidine gluconate (MEDLINE KIT)  15 mL Mouth Rinse BID   Chlorhexidine Gluconate Cloth  6 each Topical Q1200   darbepoetin (ARANESP) injection - NON-DIALYSIS  150 mcg Subcutaneous Q Wed-1800   docusate  100 mg Per Tube BID   feeding supplement (PROSource TF)  45 mL Per Tube TID   guaiFENesin  5 mL Per Tube BID   heparin injection (subcutaneous)  5,000 Units Subcutaneous Q8H   insulin aspart  0-20 Units Subcutaneous Q4H   insulin aspart  4 Units Subcutaneous Q4H   insulin glargine  10 Units Subcutaneous BID   mouth rinse  15 mL Mouth Rinse 10 times per day   midodrine  15 mg Per Tube Q8H   multivitamin  1 tablet Per Tube QHS   pantoprazole sodium  40 mg Per  Tube QHS   polyethylene glycol  17 g Per Tube Daily   sertraline  100 mg Per Tube Daily   sodium  chloride flush  10-40 mL Intracatheter [facility]

## 2020-08-02 NOTE — Progress Notes (Signed)
Daily Progress Note   Patient Name: Luis Orr       Date: 08/02/2020 DOB: [birth year]  Age: 70 y.o. MRN#: [id number] Attending Physician: Mariel Aloe, MD Primary Care Physician: Townsend Roger, MD Admit Date: 06/14/2020  Reason for Consultation/Follow-up: Establishing goals of care  Subjective: Chart Reviewed. Patient Assessed.   RN reports plans for HD later today (was unable to do yesterday d/t staffing) and no family visit today.   Appears more alert today - eyes open when I walk in.  Called and spoke with wife. As soon as I introduced myself, she immediately tells me she has spoken to some family and friends and has found a placed that will take Luis Orr close to [facility]. I asked multiple questions about who she spoke with, if this is a facility Luis Orr can live at (she says yes), are they aware he is on a ventilator/bedbound/HD dependent (she says yes). I explain to her that I am not aware of any facility nearby like this that has the capability to care for Luis Orr. She tells me she is going to speak to the person at the facility more on Friday and will have more information. I asked her if I our TOC team can reach out to her to discuss this further and she agrees. She tells me she will be at the hospital tomorrow 7/22  and I offer to meet with her in person - she agrees.   All questions answered support provided.   Length of Stay: 22 days  Vital Signs: BP 123/73   Pulse (!) 57   Temp (!) 97.5 F (36.4 C) (Oral)   Resp (!) 30   Ht '5\' 6"'$  (1.676 m)   Wt 84.6 kg   SpO2 98%   BMI 30.10 kg/m  SpO2: SpO2: 98 % O2 Device: O2 Device: Ventilator O2 Flow Rate: O2 Flow Rate (L/min): 50 L/min  Physical Exam: NAD, chronically ill appearing, opens eyes to voice Remains on vent support via trach Irregularly irregular          Flowsheet Rows    Flowsheet Row Most Recent Value  Intake Tab   Referral Department Critical care  Unit at Time of Referral ICU   Palliative Care Primary Diagnosis Nephrology  Date Notified 07/17/20  Palliative Care Type New Palliative care  Reason for referral Clarify Goals of Care  Date of Admission 06/26/2020  Date first seen by Palliative Care 07/18/20  # of days Palliative referral response time 1 Day(s)  # of days IP prior to Palliative referral 6  Clinical Assessment   Psychosocial & Spiritual Assessment   Palliative Care Outcomes        Palliative Care Assessment & Plan  HPI: Palliative Care consult requested for goals of care discussion in this 70 y.o. male  with past medical history of functional quadriplegic d/t compressive C3-C5 myelopathy, trach and vent (2018), diabetes, hypertension, anemia, and anxiety. Patient resides at [facility]. He was admitted on 07/01/2020 via EMS to ED from [place] with concerns for hypotension and altered mental status. Per reports at baseline he is alert and oriented x4. Patient was receiving treatment for pneumonia x1 mos. Patient started on IV antibiotics for sepsis in the setting of UTI. Requiring full ventilator support. 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   Code Status: Limited code  Goals of Care/Recommendations: Unclear at this time - wife believes she has found a facility in [place] that will take  Luis Orr on vent and HD - have consulted TOC to engage with her - planning to meet with wife again 7/22 in person  Prognosis: Guarded   Discharge Planning: To Be Determined  Thank you for allowing the Palliative Medicine Team to assist in the care of this patient.  Time Total: 25 minutes  Greater than 50%  of this time was spent counseling and coordinating care related to the above assessment and plan.  Juel Burrow, DNP, AGNP-C Palliative Medicine Team Team Phone # [phone]  Pager # [phone]

## 2020-08-03 DIAGNOSIS — N179 Acute kidney failure, unspecified: Secondary | ICD-10-CM | POA: Diagnosis not present

## 2020-08-03 DIAGNOSIS — Z7189 Other specified counseling: Secondary | ICD-10-CM | POA: Diagnosis not present

## 2020-08-03 DIAGNOSIS — Z9911 Dependence on respirator [ventilator] status: Secondary | ICD-10-CM | POA: Diagnosis not present

## 2020-08-03 DIAGNOSIS — J9601 Acute respiratory failure with hypoxia: Secondary | ICD-10-CM | POA: Diagnosis not present

## 2020-08-03 DIAGNOSIS — A419 Sepsis, unspecified organism: Secondary | ICD-10-CM | POA: Diagnosis not present

## 2020-08-03 DIAGNOSIS — Z515 Encounter for palliative care: Secondary | ICD-10-CM | POA: Diagnosis not present

## 2020-08-03 LAB — GLUCOSE, CAPILLARY
Glucose-Capillary: 127 mg/dL — ABNORMAL HIGH (ref 70–99)
Glucose-Capillary: 113 mg/dL — ABNORMAL HIGH (ref 70–99)
Glucose-Capillary: 122 mg/dL — ABNORMAL HIGH (ref 70–99)
Glucose-Capillary: 128 mg/dL — ABNORMAL HIGH (ref 70–99)
Glucose-Capillary: 135 mg/dL — ABNORMAL HIGH (ref 70–99)
Glucose-Capillary: 137 mg/dL — ABNORMAL HIGH (ref 70–99)

## 2020-08-03 LAB — RENAL FUNCTION PANEL
Albumin: 2.4 g/dL — ABNORMAL LOW (ref 3.5–5.0)
Anion gap: 12 (ref 5–15)
BUN: 72 mg/dL — ABNORMAL HIGH (ref 8–23)
CO2: 26 mmol/L (ref 22–32)
Calcium: 8.4 mg/dL — ABNORMAL LOW (ref 8.9–10.3)
Chloride: 98 mmol/L (ref 98–111)
Creatinine, Ser: 0.88 mg/dL (ref 0.61–1.24)
GFR, Estimated: >60 mL/min (ref >60)
Glucose, Bld: 117 mg/dL — ABNORMAL HIGH (ref 70–99)
Phosphorus: 3.9 mg/dL (ref 2.5–4.6)
Potassium: 3 mmol/L — ABNORMAL LOW (ref 3.5–5.1)
Sodium: 136 mmol/L (ref 135–145)

## 2020-08-03 LAB — CBC
HCT: 23.6 % — ABNORMAL LOW (ref 39.0–52.0)
Hemoglobin: 7.9 g/dL — ABNORMAL LOW (ref 13.0–17.0)
MCH: 25.9 pg — ABNORMAL LOW (ref 26.0–34.0)
MCHC: 33.5 g/dL (ref 30.0–36.0)
MCV: 77.4 fL — ABNORMAL LOW (ref 80.0–100.0)
Platelets: 215 10*3/uL (ref 150–400)
RBC: 3.05 MIL/uL — ABNORMAL LOW (ref 4.22–5.81)
RDW: 28.2 % — ABNORMAL HIGH (ref 11.5–15.5)
WBC: 21.4 10*3/uL — ABNORMAL HIGH (ref 4.0–10.5)
nRBC: 2.5 % — ABNORMAL HIGH (ref 0.0–0.2)

## 2020-08-03 LAB — MAGNESIUM: Magnesium: 2 mg/dL (ref 1.7–2.4)

## 2020-08-03 MED ORDER — POTASSIUM CHLORIDE 20 MEQ PO PACK
20.0000 meq | PACK | ORAL | Status: AC
  Administered 2020-08-03 (×2): 20 meq
  Filled 2020-08-03 (×2): qty 1

## 2020-08-03 MED ORDER — CHLORHEXIDINE GLUCONATE CLOTH 2 % EX PADS
6.0000 | MEDICATED_PAD | Freq: Every day | CUTANEOUS | Status: DC
  Administered 2020-08-04 – 2020-08-10 (×7): 6 via TOPICAL

## 2020-08-03 MED ORDER — INSULIN GLARGINE 100 UNIT/ML ~~LOC~~ SOLN
10.0000 [IU] | Freq: Every day | SUBCUTANEOUS | Status: DC
  Administered 2020-08-04 – 2020-08-17 (×14): 10 [IU] via SUBCUTANEOUS
  Filled 2020-08-03 (×14): qty 0.1

## 2020-08-03 MED ORDER — POTASSIUM CHLORIDE 10 MEQ/100ML IV SOLN
10.0000 meq | INTRAVENOUS | Status: AC
  Administered 2020-08-03 (×4): 10 meq via INTRAVENOUS
  Filled 2020-08-03 (×4): qty 100

## 2020-08-03 NOTE — Progress Notes (Signed)
PROGRESS NOTE    Luis Orr  OOI:[id number] DOB: [birth year] DOA: 06/30/2020 PCP: Townsend Roger, MD   Brief Narrative: Luis Orr is a 70 y.o. 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 Patient presented secondary to altered mental status and hypotension with recurrent pneumonia requiring vasopressor support and ICU admission.   ICU significant events: 6/29 admitted to ICU for septic shock on levo to maintain MAP>65, continued on vent support 6/29 Blood Cx>> staph species in 1 of 4 cultures drawn>> suspect contaminant 6/30 Off pressors 7/2 ID Consult for MDR acinetobacter in trach asp, Meropenem changed to unasyn 7/2 PRBC transfusion, iron replacement 7/5 Pressors added again. Central line placed. Drainage from gastrostomy tube >> CT Abd without fluid collection around gastrostomy tube but with diffuse body wall edema. Echo EF 50-55%, mild LVH 7/6 Renal Replacement Therapy ; Code status changed to partial Code (DNR) 7/7 Transfuse 1U PRBC 7/9 add solu cortef 7/10 off pressors, weaning stress steroids 7/11 Pressors added back on again 7/12 ID consult due to rising WBC, hypothermia, hypotension concern for worsening infection. Vancomycin added. Repeat blood cx collected. 7/15 CRRT stopped 7/17 Restarted on low dose levo. No evidence of new infection. Held off on antibiotics. Nephrology contacted family and decision was made to continue iHD this week to allow additional time for renal recovery. Patient is not a long term iHD candidate 7/18 plans for iHD, tolerated with 2L removed 719 slight drop in hgb to 6.9 will transfuse 1 unit PRBC   Assessment & Plan:   Active Problems:   Septic shock (HCC)   Pressure injury of skin   Acute respiratory failure with hypoxia (HCC)   AKI (acute kidney injury) ([place])   Hypotension   Acute on chronic respiratory failure with hypoxia and hypercapnia Secondary to  pneumonia/volume overload. Now resolved and back to baseline. Currently on ventilator support per PCCM.  Ventilator associated pneumonia Present on admission. Sputum culture significant for acinetobacter baumannii. Patient was treated with Unasyn x14 days. Completed course.  Leukocytosis Secondary to above infection and sepsis. Significantly improved and stable.  Volume overload Secondary to renal failure with anuria. Patient has improved with CRRT and now iHD.  AKI with oliguria Now appears patient is anuric. No documented urine output from last 24 hours. Nephrology is on board and was initially managing with CRRT and have transitioned to intermittent HD, however patient is not a long term iHD candidate. Palliative care has been consulted.  Septic shock Present on admission. Secondary to pneumonia. Patient required vasopressor support which is now weaned off. Resolved. Initial blood culture significant for likely contaminant. Repeat blood cultures negative.  Acute metabolic encephalopathy Secondary to acute illness. CT head negative for acute process. Appears improved and likely back at baseline.  Diabetes mellitus, type 2 Insulin needs have decreased. 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 Improving.  Quadriplegia Secondary to compressive myelopathy. Patient resides at [facility] but per chart review, [facility] will not accept him back if he is HD dependent.  Microcytic anemia Acute anemia Patient required 4 units of PRBC to date. Thought to be secondary to a combination of acute illness, chronic disease and iron deficiency. IV iron ordered today. -CBC in AM  Severe malnutrition -Continue tube feeds  Paroxysmal atrial fibrillation -Continue amiodarone  Pressure injury Medial/posterior sacrum present  on admission.  Right/posterior/lateral ankle unknown if present on admission. Left ear, not present on admission.   DVT prophylaxis: Heparin Code Status:   Code Status: Partial Code Family Communication: Called wife, no response Disposition Plan: Discharge complicated. Patient is from [facility] and cannot return secondary to new HD requirement. Consideration for admission to [facility]. Otherwise, patient and family will need to consider out-of-state options.   Consultants:  PCCM Nephrology Infectious disease Palliative care medicine  Procedures:    Antimicrobials: Unasyn IV Vancomycin Meropenem Cefepime   Subjective: Patient is nonverbal.  When when discussing possible need for him to transfer to a facility out of state, his face contorted in a way that conveyed disdain for the idea.  Objective: Vitals:   08/03/20 1147 08/03/20 1200 08/03/20 1539 08/03/20 1609  BP: (!) 158/85 134/76  130/81  Pulse: 71 68  71  Resp: (!) 30 (!) 30  (!) 30  Temp:   97.7 F (36.5 C)   TempSrc:   Oral   SpO2:  97%  97%  Weight:      Height:        Intake/Output Summary (Last 24 hours) at 08/03/2020 1624 Last data filed at 08/03/2020 1200 Gross per 24 hour  Intake 984.96 ml  Output 2190 ml  Net -1205.04 ml    Filed Weights   08/02/20 1720 08/02/20 2050 08/03/20 0500  Weight: 84.6 kg 82.6 kg 84.3 kg    Examination:  General exam: Appears calm and comfortable and in no acute distress. Not able to converse Respiratory: Rhonchi on auscultation. Respiratory effort normal with no intercostal retractions or use of accessory muscles Cardiovascular: S1 & S2 heard, RRR. No murmurs, rubs, gallops or clicks. Diffuse edema in upper and lower extremities around thighs Gastrointestinal: Abdomen is nondistended, soft and nontender. No masses felt. Normal bowel sounds heard. Ostomy bag with brown stool Musculoskeletal: No calf tenderness Skin: No cyanosis. No new rashes Psychiatry: Alert.    Data  Reviewed: I have personally reviewed following labs and imaging studies  CBC Lab Results  Component Value Date   WBC 21.4 (H) 08/03/2020   RBC 3.05 (L) 08/03/2020   HGB 7.9 (L) 08/03/2020   HCT 23.6 (L) 08/03/2020   MCV 77.4 (L) 08/03/2020   MCH 25.9 (L) 08/03/2020   PLT 215 08/03/2020   MCHC 33.5 08/03/2020   RDW 28.2 (H) 08/03/2020   LYMPHSABS 1.3 07/24/2020   MONOABS 1.7 (H) 07/24/2020   EOSABS 0.0 07/24/2020   BASOSABS 0.1 17/79/3903     Last metabolic panel Lab Results  Component Value Date   NA 136 08/03/2020   K 3.0 (L) 08/03/2020   CL 98 08/03/2020   CO2 26 08/03/2020   BUN 72 (H) 08/03/2020   CREATININE 0.88 08/03/2020   GLUCOSE 117 (H) 08/03/2020   GFRNONAA >60 08/03/2020   GFRAA NOT CALCULATED 10/11/2019   CALCIUM 8.4 (L) 08/03/2020   PHOS 3.9 08/03/2020   PROT 6.2 (L) 08/01/2020   ALBUMIN 2.4 (L) 08/03/2020   BILITOT 1.0 08/01/2020   ALKPHOS 254 (H) 08/01/2020   AST 60 (H) 08/01/2020   ALT 69 (H) 08/01/2020   ANIONGAP 12 08/03/2020    CBG (last 3)  Recent Labs    08/03/20 0743 08/03/20 1112 08/03/20 1538  GLUCAP 113* 122* 128*      GFR: Estimated Creatinine Clearance: 79.5 mL/min (by C-G formula based on SCr of 0.88 mg/dL).  Coagulation Profile: No results for input(s): INR, PROTIME in the last 168 hours.  No results found for this or any previous visit (from the past 240 hour(s)).       Radiology Studies: No results found.      Scheduled Meds:  amiodarone  200 mg Per Tube Daily   buPROPion  75 mg Per Tube BID   chlorhexidine gluconate (MEDLINE KIT)  15 mL Mouth Rinse BID   [START ON 08/04/2020] Chlorhexidine Gluconate Cloth  6 each Topical Q0600   [START ON 08/08/2020] darbepoetin (ARANESP) injection - DIALYSIS  150 mcg Intravenous Q Wed-HD   docusate  100 mg Per Tube BID   feeding supplement (PROSource TF)  45 mL Per Tube TID   guaiFENesin  5 mL Per Tube BID   heparin injection (subcutaneous)  5,000 Units Subcutaneous Q8H    insulin aspart  2 Units Subcutaneous Q4H   insulin glargine  10 Units Subcutaneous BID   mouth rinse  15 mL Mouth Rinse 10 times per day   midodrine  10 mg Per Tube TID   multivitamin  1 tablet Per Tube QHS   pantoprazole sodium  40 mg Per Tube QHS   polyethylene glycol  17 g Per Tube Daily   sertraline  100 mg Per Tube Daily   sodium chloride flush  10-40 mL Intracatheter Q12H   Continuous Infusions:  sodium chloride Stopped (07/26/20 0824)   sodium chloride 250 mL (07/31/20 1428)   sodium chloride     sodium chloride     sodium chloride     feeding supplement (NEPRO CARB STEADY) 1,000 mL (08/03/20 1551)     LOS: 23 days     Cordelia Poche, MD [facility] 08/03/2020, 4:24 PM  If 7PM-7AM, please contact night-coverage [email or web address]

## 2020-08-03 NOTE — Progress Notes (Signed)
[facility] ADULT ICU REPLACEMENT PROTOCOL   The patient does apply for the [facility] Adult ICU Electrolyte Replacment Protocol based on the criteria listed below:   1.Exclusion criteria: TCTS patients, ECMO patients and Hypothermia Protocol, and   Dialysis patients 2. Is GFR >/= 30 ml/min? Yes.    Patient's GFR today is >60 3. Is SCr </= 2? Yes.   Patient's SCr is 0.88 mg/dL 4. Did SCr increase >/= 0.5 in 24 hours? No. 5.Pt's weight >40kg  Yes.   6. Abnormal electrolyte  K 3.0 7. Electrolytes replaced per protocol  Christeen Douglas 08/03/2020 6:53 AM

## 2020-08-03 NOTE — Progress Notes (Signed)
[facility] Progress Note    Assessment/ Plan:   AKI -baseline Cr 0.4. AKI due to ATN in the context of shock. CRRT initiated on 7/6 for anasarca with ineffective diuresis and uremia.  CCM for rij temp line placement 31/72- 35 days old - CRRT 7/6 - 7/15, now off due to improved hemodynamics -Not a good long term candidate for HD due to poor functional status, vent dependency. Dr. Candiss Norse voiced this concern to both son and wife over the phone on 7/6 and they wished to proceed forward with RRT.  Appreciate palliative care's assistance.  Dr. Johnney Ou discussed concern for long term dialysis to wife and son 7/12 and again on 7/17.  The plan was continue iHD through this week given he's out of shock phase but if no signs of recovery then reconvene about GOC if he will be vent and dialysis dependent.  He already had been living at [facility] for several years prior to this admission.  Palliative care met with family yesterday and family not willing to stop dialysis at this point.   Plan is for IHD again likely Saturday as minimal UOP and bun climbing-  will go ahead and consult IR for a tunneled HD cath    Hyperkalemia shifted and lokelma given PRN, HD today 2K. Is good today again-  has been good for a while now , will give potassium today    CKD-MBD, hyperphosphatemia -phos had been ok with CRRT, then up, on nepro, started renvela-  have stopped as is not usually effective in this situation and least of our concerns  Acute metabolic encephalopathy, likely related to sepsis and uremia:   CT head unrevealing, per primary  Septic shock:  resolved -off pressors now -off abx as of 7/15  Chronic respiratory failure s/p trach -vent mgmt per ccm  Quadriplegia 2/2 compressive myelopathy -resides at [facility] -appreciate palliative care's assistance-  [facility] will not take him back if he is dialysis dependent  Microcytic anemia -transfuse for hgb <7. give iron load as severely iron  deficient on 6/30 labs (t sat 5%) -hgb 8.4 > 7.7 > 6.6 transfusion 8 > 7.5.....6.9  on 7/19-  given blood. Added ESA  Dispo:  with comorbids not a good long term dialysis candidate.  Family wish to continue current care.   He is technically tolerating IHD -  continuing HD this week with old non tunneled cath.  Based on previous conversations I think family will want to continue with HD-  finding a place for him to go post hosp will be very difficult - likely req out of state facility-  that may be the only card we can play to get the family to realize this is not the best idea for him.  Will place consult for IR to place a TDC as that is where we are going and would hate for him to get septic again with an old temp cath   Subjective:   Hypothermic-  no uop really recorded  -  hemodynamically stable - ended up getting dialysis last night   Objective:   BP 122/71   Pulse 77   Temp 98 F (36.7 C) (Axillary)   Resp (!) 30   Ht _0  (1.676 m)   Wt 84.3 kg   SpO2 98%   BMI 30.00 kg/m   Intake/Output Summary (Last 24 hours) at 08/03/2020 0728 Last data filed at 08/03/2020 0400 Gross per 24 hour  Intake 490 ml  Output 4485 ml  Net -3995 ml   Weight change:   Physical Exam: OTL:[id number] ill appearing-  eyes open-  seems to understand what I am saying -  temp cath in for 15 days  CVS:s1s2, rrr Resp:trach, bl chest expansion, on full vent support CBU:LAGT Ext:  2+ diffuse edema Neuro: functional quadriplegic, opening eyes, looking around   Imaging: No results found.  Labs: BMET Recent Labs  Lab 07/29/20 0503 07/29/20 0645 07/29/20 1122 07/29/20 1419 07/30/20 0540 07/30/20 1054 07/30/20 1408 07/30/20 2024 07/31/20 0014 07/31/20 0512 08/01/20 0440 08/02/20 0406 08/03/20 0527  NA 135 134* 135  --  135  --   --   --   --  135 132* 133* 136  K 6.4* 5.8* 5.4*   < > 5.8*   < > 3.0* 3.3* 3.2* 3.8 3.3* 3.8 3.0*  CL 102 102 101  --  101  --   --   --   --  96* 96* 95* 98  CO2  21* 23 19*  --  20*  --   --   --   --  _0 GLUCOSE 212* 347* 283*  --  339*  --   --   --   --  145* 120* 120* 117*  BUN 131* 130* 142*  --  165*  --   --   --   --  94* 131* 157* 72*  CREATININE 1.07 1.08 1.15  --  1.39*  --   --   --   --  0.87 1.26* 1.40* 0.88  CALCIUM 8.3* 8.0* 8.3*  --  7.8*  --   --   --   --  8.3* 8.3* 8.5* 8.4*  PHOS 9.6*  --  9.7*  --  11.1*  --   --   --   --  6.3* 7.6* 8.5* 3.9   < > = values in this interval not displayed.   CBC Recent Labs  Lab 07/31/20 0512 08/01/20 0440 08/02/20 0406 08/03/20 0527  WBC 11.5* 13.1* 15.3* 21.4*  HGB 6.9* 8.5* 8.8* 7.9*  HCT 21.0* 24.9* 25.6* 23.6*  MCV 75.3* 75.5* 76.2* 77.4*  PLT 246 231 223 215    Medications:     amiodarone  200 mg Per Tube Daily   buPROPion  75 mg Per Tube BID   chlorhexidine gluconate (MEDLINE KIT)  15 mL Mouth Rinse BID   Chlorhexidine Gluconate Cloth  6 each Topical Q1200   [START ON 08/08/2020] darbepoetin (ARANESP) injection - DIALYSIS  150 mcg Intravenous Q Wed-HD   docusate  100 mg Per Tube BID   feeding supplement (PROSource TF)  45 mL Per Tube TID   guaiFENesin  5 mL Per Tube BID   heparin injection (subcutaneous)  5,000 Units Subcutaneous Q8H   insulin aspart  2 Units Subcutaneous Q4H   insulin glargine  10 Units Subcutaneous BID   mouth rinse  15 mL Mouth Rinse 10 times per day   midodrine  10 mg Per Tube TID   multivitamin  1 tablet Per Tube QHS   pantoprazole sodium  40 mg Per Tube QHS   polyethylene glycol  17 g Per Tube Daily   potassium chloride  20 mEq Per Tube Q4H   sertraline  100 mg Per Tube Daily   sodium chloride flush  10-40 mL Intracatheter Q12H     [facility]

## 2020-08-03 NOTE — Progress Notes (Signed)
Daily Progress Note   Patient Name: Luis Orr       Date: 08/03/2020 DOB: [birth year]  Age: 70 y.o. MRN#: [id number] Attending Physician: Mariel Aloe, MD Primary Care Physician: Townsend Roger, MD Admit Date: 06/25/2020  Reason for Consultation/Follow-up: Establishing goals of care  Subjective: Chart Reviewed. Patient Assessed. Alert today - indicates he is not in pain. RN reports patient required pressors for short amount of time following HD.   Called and spoke with wife. Wife tells me she continues to be interested in a facility in [place] that she believes will take Luis Orr - I again share I am not sure this exists.   Called case management who confirmed that facility wife was referring to is an outpatient HD facility and not a facility that patient can go stay at. Case management also confirmed that the facilities that would be able to care for Luis Orr are out of state. We discussed a possibility that [facility] may be able to take Luis Orr if/when they have a bed available but this would only be short term and he would then be placed out of state following LTACH stay.  I called patient's wife back and shared above information with there. She was understandably upset by this information. She initially says maybe she will move wherever the patient is placed. She then shares uncertainty about that.   I asked wife to consider what Mr. Grumbles would want; should we continue these interventions given difficult situation. She tells me she does not know what to do.  I encouraged wife to take time to consider what is most important to [name] and also discuss situation with other family members for support. We discuss a plan of allowing her time this weekend and we will follow up with her early next week. She is agreeable.   All questions answered support provided.   Length of Stay: 23 days  Vital Signs: BP 130/81   Pulse 71   Temp 97.7 F (36.5 C) (Oral)   Resp  (!) 30   Ht '5\' 6"'$  (1.676 m)   Wt 84.3 kg   SpO2 97%   BMI 30.00 kg/m  SpO2: SpO2: 97 % O2 Device: O2 Device: Ventilator O2 Flow Rate: O2 Flow Rate (L/min): 50 L/min  Physical Exam: NAD, chronically ill appearing, opens eyes spontaneously Remains on vent support via trach Irregularly irregular           Flowsheet Rows    Flowsheet Row Most Recent Value  Intake Tab   Referral Department Critical care  Unit at Time of Referral ICU  Palliative Care Primary Diagnosis Nephrology  Date Notified 07/17/20  Palliative Care Type New Palliative care  Reason for referral Clarify Goals of Care  Date of Admission 07/09/2020  Date first seen by Palliative Care 07/18/20  # of days Palliative referral response time 1 Day(s)  # of days IP prior to Palliative referral 6  Clinical Assessment   Psychosocial & Spiritual Assessment   Palliative Care Outcomes        Palliative Care Assessment & Plan  HPI: Palliative Care consult requested for goals of care discussion in this 70 y.o. male  with past medical history of functional quadriplegic d/t compressive C3-C5 myelopathy, trach and vent (2018), diabetes, hypertension, anemia, and anxiety. Patient resides at [facility] facility. He was admitted on 06/29/2020 via EMS to ED from [facility] with concerns for hypotension and altered mental status. Per reports at baseline he is  alert and oriented x4. Patient was receiving treatment for pneumonia x1 mos. Patient started on IV antibiotics for sepsis in the setting of UTI. Requiring full ventilator support. 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   Code Status: Limited code  Goals of Care/Recommendations: Unclear at this time - wife understands d/t patient's new need for HD and vent support he will need to be placed out of state (even if [facility] LT[facility] can take him it will only be short term and will eventually end up out of state) - she is considering if Luis Orr would want to continue HD given  situation - plan to follow up early next week  Prognosis: Guarded   Discharge Planning: To Be Determined  Thank you for allowing the Palliative Medicine Team to assist in the care of this patient.  Time Total: 25 minutes  Greater than 50%  of this time was spent counseling and coordinating care related to the above assessment and plan.  Juel Burrow, DNP, AGNP-C Palliative Medicine Team Team Phone # [phone]  Pager # [phone]

## 2020-08-04 ENCOUNTER — Encounter (HOSPITAL_COMMUNITY): Payer: Self-pay | Admitting: Internal Medicine

## 2020-08-04 DIAGNOSIS — Z7189 Other specified counseling: Secondary | ICD-10-CM | POA: Diagnosis not present

## 2020-08-04 DIAGNOSIS — N179 Acute kidney failure, unspecified: Secondary | ICD-10-CM | POA: Diagnosis not present

## 2020-08-04 DIAGNOSIS — Z515 Encounter for palliative care: Secondary | ICD-10-CM | POA: Diagnosis not present

## 2020-08-04 DIAGNOSIS — J9601 Acute respiratory failure with hypoxia: Secondary | ICD-10-CM | POA: Diagnosis not present

## 2020-08-04 LAB — RENAL FUNCTION PANEL
Albumin: 2.4 g/dL — ABNORMAL LOW (ref 3.5–5.0)
Anion gap: 15 (ref 5–15)
BUN: 98 mg/dL — ABNORMAL HIGH (ref 8–23)
CO2: 25 mmol/L (ref 22–32)
Calcium: 8.9 mg/dL (ref 8.9–10.3)
Chloride: 94 mmol/L — ABNORMAL LOW (ref 98–111)
Creatinine, Ser: 1.12 mg/dL (ref 0.61–1.24)
GFR, Estimated: >60 mL/min (ref >60)
Glucose, Bld: 139 mg/dL — ABNORMAL HIGH (ref 70–99)
Phosphorus: 4.9 mg/dL — ABNORMAL HIGH (ref 2.5–4.6)
Potassium: 4.9 mmol/L (ref 3.5–5.1)
Sodium: 134 mmol/L — ABNORMAL LOW (ref 135–145)

## 2020-08-04 LAB — CBC
HCT: 26.8 % — ABNORMAL LOW (ref 39.0–52.0)
Hemoglobin: 8.7 g/dL — ABNORMAL LOW (ref 13.0–17.0)
MCH: 26.7 pg (ref 26.0–34.0)
MCHC: 32.5 g/dL (ref 30.0–36.0)
MCV: 82.2 fL (ref 80.0–100.0)
Platelets: UNDETERMINED 10*3/uL (ref 150–400)
RBC: 3.26 MIL/uL — ABNORMAL LOW (ref 4.22–5.81)
RDW: 28 % — ABNORMAL HIGH (ref 11.5–15.5)
WBC: 19.2 10*3/uL — ABNORMAL HIGH (ref 4.0–10.5)
nRBC: 1.5 % — ABNORMAL HIGH (ref 0.0–0.2)

## 2020-08-04 LAB — GLUCOSE, CAPILLARY
Glucose-Capillary: 129 mg/dL — ABNORMAL HIGH (ref 70–99)
Glucose-Capillary: 144 mg/dL — ABNORMAL HIGH (ref 70–99)
Glucose-Capillary: 148 mg/dL — ABNORMAL HIGH (ref 70–99)
Glucose-Capillary: 171 mg/dL — ABNORMAL HIGH (ref 70–99)
Glucose-Capillary: 192 mg/dL — ABNORMAL HIGH (ref 70–99)
Glucose-Capillary: 212 mg/dL — ABNORMAL HIGH (ref 70–99)

## 2020-08-04 LAB — MAGNESIUM: Magnesium: 2.1 mg/dL (ref 1.7–2.4)

## 2020-08-04 NOTE — H&P (Signed)
Chief Complaint: Patient was seen in consultation today for image guided tunneled HD catheter placement  Chief Complaint  Patient presents with   Altered Mental Status   at the request of Dr. Mancel Bale, K.   Referring Physician(s): Dr. Mancel Bale, K.   Supervising Physician: Aletta Edouard  Patient Status: [facility] - In-pt  History of Present Illness: Luis Orr is a 70 y.o. 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 Patient has been on CRRT viz right IJ temp cath placed by critical care, off CRRT on 7/15.  Of note, patient is a not a good candidate for long term RRT due to poor functional status and vent dependent. After thorough discussion with both nephrology and critical care, family wished to proceed with HD.   IR was reequested for tunneled HD catheter placement eary next week.   Patient laying in bed, not in acute distress.  On vent via trach.  ROS not obtained, pt not responsive to verbal stimuli.   Past Medical History:  Diagnosis Date   Anemia    Anxiety    Chronic respiratory failure (HCC)    DM2 (diabetes mellitus, type 2) (HCC)    Functional quadriplegia (HCC)    Hypertension    Multiple drug resistant organism (MDRO) culture positive    Urine retention    UTI (urinary tract infection)     Past Surgical History:  Procedure Laterality Date   COLOSTOMY     CYSTOSCOPY WITH STENT PLACEMENT Right 10/08/2019   Procedure: CYSTOSCOPY WITH  STENT PLACEMENT;  Surgeon: Lucas Mallow, MD;  Location: [facility];  Service: Urology;  Laterality: Right;   PEG PLACEMENT     ROTATOR CUFF REPAIR     TRACHEOSTOMY      Allergies: Adhesive [tape]  Medications: Prior to Admission medications   Medication Sig Start Date End Date Taking?  Authorizing Provider  acetaminophen (TYLENOL) 325 MG suppository Place 650 mg rectally every 6 (six) hours as needed for mild pain or fever (.=101).   Yes [provider]  Amino Acids-Protein Hydrolys (FEEDING SUPPLEMENT, PRO-STAT 64,) LIQD Take 30 mLs by mouth daily.   Yes [provider]  amLODipine (NORVASC) 5 MG tablet Place 5 mg into feeding tube daily.   Yes [provider]  buPROPion (WELLBUTRIN) 75 MG tablet Place 75 mg into feeding tube 2 (two) times daily.   Yes [provider]  glycopyrrolate (ROBINUL) 1 MG tablet Place 1 mg into feeding tube every 8 (eight) hours.   Yes [provider]  losartan (COZAAR) 100 MG tablet Place 100 mg into feeding tube daily.   Yes [provider]  nutrition supplement, JUVEN, (JUVEN) PACK Place 1 packet into feeding tube in the morning and at bedtime.   Yes [provider]  Polyethyl Glycol-Propyl Glycol (SYSTANE) 0.4-0.3 % SOLN Place 1 drop into both eyes 2 (two) times daily.   Yes [provider]  rivaroxaban (XARELTO) 20 MG TABS tablet Take 20 mg by mouth daily with supper.   Yes [provider]  sodium zirconium cyclosilicate (LOKELMA) 10 g PACK packet Place 10 g into feeding tube daily.   Yes [provider]  acetaminophen (TYLENOL) 325 MG tablet Place 650 mg into feeding tube every 8 (eight) hours as needed for mild pain, fever or headache.  [provider]  amiodarone (PACERONE) 200 MG tablet Place 200 mg into feeding tube daily.    [provider]  carvedilol (COREG) 6.25 MG tablet Place 6.25 mg into feeding tube every 12 (twelve) hours. Patient not taking: Reported on 07/12/2020    [provider]  chlorhexidine (PERIDEX) 0.12 % solution 5 mLs by Mouth Rinse route every 12 (twelve) hours.     [provider]  clonazePAM (KLONOPIN) 0.5 MG tablet Place 1 tablet (0.5 mg total) into feeding tube every 8 (eight) hours. Patient  taking differently: Place 0.5 mg into feeding tube 2 (two) times daily. 07/12/19   Allie Bossier, MD  diltiazem (CARDIZEM) 60 MG tablet Place 60 mg into feeding tube every 6 (six) hours.    [provider]  docusate (COLACE) 50 MG/5ML liquid Place 10 mLs (100 mg total) into feeding tube every 12 (twelve) hours. 07/12/19   Allie Bossier, MD  famotidine (PEPCID) 20 MG tablet Place 1 tablet (20 mg total) into feeding tube at bedtime. Patient taking differently: Place 20 mg into feeding tube 2 (two) times daily. 07/12/19   Allie Bossier, MD  fentaNYL (DURAGESIC) 25 MCG/HR Place 1 patch onto the skin every 3 (three) days. 07/14/19   Allie Bossier, MD  gabapentin (NEURONTIN) 250 MG/5ML solution Place 6 mLs (300 mg total) into feeding tube 2 (two) times daily. 07/12/19   Allie Bossier, MD  gabapentin (NEURONTIN) 300 MG capsule Place 300 mg into feeding tube every 12 (twelve) hours.    [provider]  HYDROcodone-acetaminophen (NORCO/VICODIN) 5-325 MG tablet Place 1 tablet into feeding tube every 6 (six) hours as needed for moderate pain.    [provider]  insulin glargine (LANTUS) 100 UNIT/ML injection Inject 30 Units into the skin daily.    [provider]  insulin regular (NOVOLIN R) 100 units/mL injection Inject 2-10 Units into the skin See admin instructions. Inject 2-10 units into the skin every 6 hours, per sliding scale: BGL 151-200 = 2 units, 201-250= 4 units, 251-300= 6 units, 301-350= 8 units, 351-400 = 10 units and call MD if <60 or >400    [provider]  ipratropium-albuterol (DUONEB) 0.5-2.5 (3) MG/3ML SOLN Take 3 mLs by nebulization every 4 (four) hours as needed (for shortness of breath).    [provider]  lansoprazole (PREVACID) 30 MG capsule Place 30 mg into feeding tube daily at 12 noon. Patient not taking: Reported on 07/12/2020    [provider]  lisinopril (ZESTRIL) 5 MG tablet Take 1 tablet (5 mg total) by mouth  daily. Patient not taking: Reported on 12/19/2019 07/13/19   Allie Bossier, MD  LORazepam (ATIVAN) 2 MG/ML injection Inject 0.25 mLs (0.5 mg total) into the vein every 4 (four) hours as needed for anxiety. Replace with clonazepam once enteral absorption confirmed. Patient not taking: Reported on 12/19/2019 10/11/19   Candee Furbish, MD  Multiple Vitamin (MULTIVITAMIN WITH MINERALS) TABS tablet Take 1 tablet by mouth daily. Patient taking differently: Place 1 tablet into feeding tube daily. '18mg'$  iron-473mg-25mcg 07/13/19   WAllie Bossier MD  Nutritional Supplements (FEEDING SUPPLEMENT, OSMOLITE 1.5 CAL,) LIQD Place 1,000 mLs into feeding tube continuous. Patient taking differently: Place 1,000 mLs into feeding tube continuous. Infuse at 50 ml/hr. 07/12/19   WAllie Bossier MD  Olopatadine HCl 0.2 % SOLN Place 1 drop into both eyes daily. Patient not taking: Reported on 07/12/2020    [provider]  ondansetron (  ZOFRAN) 4 MG/2ML SOLN injection Inject 2 mLs (4 mg total) into the vein every 6 (six) hours as needed for nausea. 10/11/19   Candee Furbish, MD  pantoprazole (PROTONIX) 40 MG injection Inject 40 mg into the vein at bedtime. Patient not taking: Reported on 12/19/2019 10/11/19   Candee Furbish, MD  polyethylene glycol (MIRALAX / GLYCOLAX) 17 g packet Take 17 g by mouth daily. Patient taking differently: Place 17 g into feeding tube daily.  07/18/19   Candee Furbish, MD  scopolamine (TRANSDERM-SCOP) 1 MG/3DAYS Place 1 patch onto the skin every 3 (three) days.    [provider]  sertraline (ZOLOFT) 100 MG tablet Place 1 tablet (100 mg total) into feeding tube at bedtime. 07/12/19   Allie Bossier, MD  Sodium Chloride Flush (NORMAL SALINE FLUSH IV) Inject 10 mLs into the vein See admin instructions. 10 ml's as needed before and after medications    [provider]     History reviewed. No pertinent family history.  Social History   Socioeconomic History   Marital  status: Married    Spouse name: Not on file   Number of children: Not on file   Years of education: Not on file   Highest education level: Not on file  Occupational History   Not on file  Tobacco Use   Smoking status: Never   Smokeless tobacco: Never  Substance and Sexual Activity   Alcohol use: Not on file   Drug use: Not on file   Sexual activity: Not on file  Other Topics Concern   Not on file  Social History Narrative   Not on file   Social Determinants of Health   Financial Resource Strain: Not on file  Food Insecurity: Not on file  Transportation Needs: Not on file  Physical Activity: Not on file  Stress: Not on file  Social Connections: Not on file     Review of Systems: not performed, pt not responsive    Vital Signs: BP 107/62 (BP Location: Left Leg)   Pulse 88   Temp 98.7 F (37.1 C) (Axillary)   Resp (!) 21   Ht '5\' 6"'$  (1.676 m)   Wt 176 lb 2.4 oz (79.9 kg)   SpO2 100%   BMI 28.43 kg/m   Physical Exam Vitals reviewed.  Constitutional:      General: He is not in acute distress.    Appearance: He is ill-appearing.  HENT:     Head: Normocephalic and atraumatic.  Neck:     Comments: Positive for trach  Cardiovascular:     Rate and Rhythm: Normal rate and regular rhythm.     Pulses: Normal pulses.     Heart sounds: Normal heart sounds.  Pulmonary:     Effort: Pulmonary effort is normal.     Breath sounds: Normal breath sounds.  Abdominal:     General: Abdomen is flat.     Palpations: Abdomen is soft.  Skin:    General: Skin is warm and dry.     Coloration: Skin is not jaundiced or pale.  Neurological:     Mental Status: Mental status is at baseline.     Comments: Not responsive to verbal stimuli     MD Evaluation Airway: WNL Heart: WNL Abdomen: WNL Chest/ Lungs: WNL ASA  Classification: 3 Mallampati/Airway Score: Two  Imaging: CT ABDOMEN PELVIS WO CONTRAST  Result Date: 07/26/2020 CLINICAL DATA:  70 year old male with sepsis.  Fever. Pleural effusion. EXAM: CT CHEST,  ABDOMEN AND PELVIS WITHOUT CONTRAST TECHNIQUE: Multidetector CT imaging of the chest, abdomen and pelvis was performed following the standard protocol without IV contrast. COMPARISON:  CT Abdomen and Pelvis 07/17/2020. Chest radiographs 07/24/2020 and earlier. FINDINGS: CT CHEST FINDINGS Cardiovascular: Stable cardiac size at the upper limits of normal. No pericardial effusion. Mild Calcified aortic atherosclerosis. Right IJ approach dual lumen catheter. Calcified coronary artery atherosclerosis on series 3, image 34. Mediastinum/Nodes: Negative. No mediastinal mass or lymphadenopathy. Lungs/Pleura: Tracheostomy in place with probable overinflation of the cuff (series 4, image 25). The major airways are patent. There is widespread tree-in-bud nodularity throughout both lungs with superimposed bilateral lower lobe bronchiectasis and consolidation with air bronchograms. Compared to 07/17/2020, ventilation in the bilateral middle lobes has improved. Small dependent pleural effusions have not significantly changed since that time. In the left cardiophrenic angle there is subpleural emphysema or lung scarring with early honeycombing. Musculoskeletal: Lower thoracic spine ankylosis related to both loss of disc spaces and endplate osteophytes. No acute or suspicious osseous abnormality identified in the chest. CT ABDOMEN PELVIS FINDINGS Hepatobiliary: Small volume perihepatic ascites has decreased. Evidence of gallbladder sludge. But no pericholecystic inflammation. No evidence of bile duct enlargement. Pancreas: Partially atrophied, otherwise negative. Spleen: Negative. Adrenals/Urinary Tract: Normal adrenal glands. Atrophied right kidney with double-J ureteral stent in place. Left kidney with large nearly 9 cm partially exophytic but simple fluid density cyst. Smaller left upper pole and midpole cysts. Multifocal left nephrolithiasis appears stable. No left hydronephrosis.  Decompressed left ureter to the bladder. Bladder is decompressed by Foley catheter. Stomach/Bowel: Stable and negative rectum, blind-ending sigmoid colon with staple line on series 3, image 97. Descending colostomy with no adverse features. Much of the large bowel is decompressed. Cecum is on a lax mesentery. Terminal ileum is decompressed. No dilated small bowel. Normal appendix which terminates on coronal image 49. Gastrostomy tube with no adverse features. Negative duodenum. No free air. Vascular/Lymphatic: Aortoiliac calcified atherosclerosis. Normal caliber abdominal aorta. No lymphadenopathy. Reproductive: Urethral catheter in place with no adverse features. Other: Regressed body wall edema since 07/17/2020. No pelvic free fluid. Musculoskeletal: Interbody ankylosis at L2-L3 has an appearance suggestive of healed discitis osteomyelitis. Stable visualized osseous structures. From the recent CT abdomen. No acute osseous abnormality identified. IMPRESSION: 1. Tracheostomy in place, cuff may be overinflated. 2. Widespread tree-in-bud nodularity throughout both lungs compatible with infective bronchiolitis (bacterial, viral, or fungal). Continued extensive lower lobe bronchiectasis and consolidation. However, middle lobe consolidation has regressed since 07/17/2020. Small pleural effusions not significantly changed since that time. 3. Regression of anasarca and small volume ascites since 07/17/2020. 4. No other acute or inflammatory process identified in the chest, abdomen, or pelvis. Chronically atrophic right kidney with double-J ureteral stent in place. Left nephrolithiasis without obstruction on that side. Bladder decompressed by Foley catheter. 5. Aortic Atherosclerosis (ICD10-I70.0). Electronically Signed   By: Genevie Ann M.D.   On: 07/26/2020 11:41   CT ABDOMEN PELVIS WO CONTRAST  Result Date: 07/17/2020 CLINICAL DATA:  Abdominal distension and drainage around gastrostomy tube. EXAM: CT ABDOMEN AND PELVIS  WITHOUT CONTRAST TECHNIQUE: Multidetector CT imaging of the abdomen and pelvis was performed following the standard protocol without IV contrast. COMPARISON:  10/07/2019 FINDINGS: Lower chest: Patchy and consolidative airspace disease is identified in both lower lungs suggesting multifocal pneumonia. Small bilateral pleural effusions evident. Hepatobiliary: No focal abnormality in the liver on this study without intravenous contrast. High attenuation material in the lumen of the gallbladder is compatible with sludge or stones. No intrahepatic  or extrahepatic biliary dilation. Pancreas: No focal mass lesion. No dilatation of the main duct. No intraparenchymal cyst. No peripancreatic edema. Spleen: No splenomegaly. No focal mass lesion. Adrenals/Urinary Tract: No adrenal nodule or mass. Right kidney is markedly atrophic with internal ureteral stent in situ. Proximal loop is formed in the posterior aspect of the upper pole, presumably in an upper pole calyx although this cannot be confirmed on today's study. Distal loop is formed in the urinary bladder. Stone along the mid right ureter (56/3 and sagittal 94/7 could be in the ureter or adjacent gonadal vein. Multiple left renal cysts noted with nonobstructing left renal stones. Dominant cyst measures 8.8 cm. Inferior septation in this cyst seen on previous contrast infused imaging is not apparent on today's noncontrast study. No left hydroureter. Bladder is decompressed by Foley catheter. Stomach/Bowel: Gastrostomy tube noted. No evidence for fluid collection around the gastrostomy tube. Stomach is decompressed. Duodenum is normally positioned as is the ligament of Treitz. No small bowel wall thickening. No small bowel dilatation. The terminal ileum is normal. The appendix is normal. Colon is decompressed. 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 Vascular/Lymphatic: There is abdominal aortic atherosclerosis without aneurysm. There is no  gastrohepatic or hepatoduodenal ligament lymphadenopathy. No retroperitoneal or mesenteric lymphadenopathy. No pelvic sidewall lymphadenopathy. Reproductive: The prostate gland and seminal vesicles are unremarkable. Other: Small volume fluid noted adjacent to the liver. Small volume fluid noted left pelvis. Musculoskeletal: Diffuse body wall edema. No worrisome lytic or sclerotic osseous abnormality. Midline wound identified posterior to the inferior sacrum with packing material evident. IMPRESSION: 1. Gastrostomy tube noted without evidence for fluid collection around the gastrostomy tube. 2. Marked atrophy of the right kidney with internal ureteral stent in situ. Proximal loop is formed in the posterior aspect of the upper pole, presumably in an upper pole calyx although this cannot be confirmed on today's study. 3. Stone along the mid right ureter could be in the ureter or adjacent gonadal vein. 4. Patchy and consolidative airspace disease in both lower lungs suggesting multifocal pneumonia. Small bilateral pleural effusions. 5. Small volume ascites. 6. Diffuse body wall edema. 7. Aortic Atherosclerosis (ICD10-I70.0). Electronically Signed   By: Misty Stanley M.D.   On: 07/17/2020 11:31   CT HEAD WO CONTRAST  Result Date: 07/27/2020 CLINICAL DATA:  Initial evaluation for mental status change, unknown cause. EXAM: CT HEAD WITHOUT CONTRAST TECHNIQUE: Contiguous axial images were obtained from the base of the skull through the vertex without intravenous contrast. COMPARISON:  Prior CT from 06/29/2020. FINDINGS: Brain: Cerebral volume within normal limits for patient age. Mild chronic microvascular ischemic disease noted involving the supratentorial cerebral white matter. No evidence for acute intracranial hemorrhage. No findings to suggest acute large vessel territory infarct. No mass lesion, midline shift, or mass effect. Ventricles are normal in size without evidence for hydrocephalus. No extra-axial fluid  collection identified. Vascular: No hyperdense vessel identified. Skull: Scalp soft tissues demonstrate no acute abnormality. Calvarium intact. Sinuses/Orbits: Globes and orbital soft tissues within normal limits. Visualized paranasal sinuses are clear. No mastoid effusion. IMPRESSION: 1. No acute intracranial abnormality. 2. Mild chronic microvascular ischemic disease. Electronically Signed   By: Jeannine Boga M.D.   On: 07/27/2020 19:15   CT Head Wo Contrast  Result Date: 07/02/2020 CLINICAL DATA:  70 year old male with altered mental status. EXAM: CT HEAD WITHOUT CONTRAST TECHNIQUE: Contiguous axial images were obtained from the base of the skull through the vertex without intravenous contrast. COMPARISON:  None. FINDINGS: Brain: Mild  age-related atrophy and chronic microvascular ischemic changes. There is no acute intracranial hemorrhage. No mass effect or midline shift. No extra-axial fluid collection. Vascular: No hyperdense vessel or unexpected calcification. Skull: Normal. Negative for fracture or focal lesion. Sinuses/Orbits: No acute finding. Other: None IMPRESSION: 1. No acute intracranial pathology. 2. Mild age-related atrophy and chronic microvascular ischemic changes. Electronically Signed   By: Anner Crete M.D.   On: 06/26/2020 21:22   CT CHEST WO CONTRAST  Result Date: 07/26/2020 CLINICAL DATA:  70 year old male with sepsis. Fever. Pleural effusion. EXAM: CT CHEST, ABDOMEN AND PELVIS WITHOUT CONTRAST TECHNIQUE: Multidetector CT imaging of the chest, abdomen and pelvis was performed following the standard protocol without IV contrast. COMPARISON:  CT Abdomen and Pelvis 07/17/2020. Chest radiographs 07/24/2020 and earlier. FINDINGS: CT CHEST FINDINGS Cardiovascular: Stable cardiac size at the upper limits of normal. No pericardial effusion. Mild Calcified aortic atherosclerosis. Right IJ approach dual lumen catheter. Calcified coronary artery atherosclerosis on series 3, image 34.  Mediastinum/Nodes: Negative. No mediastinal mass or lymphadenopathy. Lungs/Pleura: Tracheostomy in place with probable overinflation of the cuff (series 4, image 25). The major airways are patent. There is widespread tree-in-bud nodularity throughout both lungs with superimposed bilateral lower lobe bronchiectasis and consolidation with air bronchograms. Compared to 07/17/2020, ventilation in the bilateral middle lobes has improved. Small dependent pleural effusions have not significantly changed since that time. In the left cardiophrenic angle there is subpleural emphysema or lung scarring with early honeycombing. Musculoskeletal: Lower thoracic spine ankylosis related to both loss of disc spaces and endplate osteophytes. No acute or suspicious osseous abnormality identified in the chest. CT ABDOMEN PELVIS FINDINGS Hepatobiliary: Small volume perihepatic ascites has decreased. Evidence of gallbladder sludge. But no pericholecystic inflammation. No evidence of bile duct enlargement. Pancreas: Partially atrophied, otherwise negative. Spleen: Negative. Adrenals/Urinary Tract: Normal adrenal glands. Atrophied right kidney with double-J ureteral stent in place. Left kidney with large nearly 9 cm partially exophytic but simple fluid density cyst. Smaller left upper pole and midpole cysts. Multifocal left nephrolithiasis appears stable. No left hydronephrosis. Decompressed left ureter to the bladder. Bladder is decompressed by Foley catheter. Stomach/Bowel: Stable and negative rectum, blind-ending sigmoid colon with staple line on series 3, image 97. Descending colostomy with no adverse features. Much of the large bowel is decompressed. Cecum is on a lax mesentery. Terminal ileum is decompressed. No dilated small bowel. Normal appendix which terminates on coronal image 49. Gastrostomy tube with no adverse features. Negative duodenum. No free air. Vascular/Lymphatic: Aortoiliac calcified atherosclerosis. Normal caliber  abdominal aorta. No lymphadenopathy. Reproductive: Urethral catheter in place with no adverse features. Other: Regressed body wall edema since 07/17/2020. No pelvic free fluid. Musculoskeletal: Interbody ankylosis at L2-L3 has an appearance suggestive of healed discitis osteomyelitis. Stable visualized osseous structures. From the recent CT abdomen. No acute osseous abnormality identified. IMPRESSION: 1. Tracheostomy in place, cuff may be overinflated. 2. Widespread tree-in-bud nodularity throughout both lungs compatible with infective bronchiolitis (bacterial, viral, or fungal). Continued extensive lower lobe bronchiectasis and consolidation. However, middle lobe consolidation has regressed since 07/17/2020. Small pleural effusions not significantly changed since that time. 3. Regression of anasarca and small volume ascites since 07/17/2020. 4. No other acute or inflammatory process identified in the chest, abdomen, or pelvis. Chronically atrophic right kidney with double-J ureteral stent in place. Left nephrolithiasis without obstruction on that side. Bladder decompressed by Foley catheter. 5. Aortic Atherosclerosis (ICD10-I70.0). Electronically Signed   By: Genevie Ann M.D.   On: 07/26/2020 11:41   US RENAL  Result Date: 07/18/2020 CLINICAL DATA:  Acute renal insufficiency. EXAM: RENAL / URINARY TRACT ULTRASOUND COMPLETE COMPARISON:  CT 07/17/2020. FINDINGS: Right Kidney: Renal measurements: 9.4 x 4.2 x 5.4 cm = volume: 110.7 mL. Right kidney is echogenic. Atrophy right kidney again noted. 1.7 cm simple cyst. No hydronephrosis visualized. Previously noted right renal stent not visualized by ultrasound. Left Kidney: Renal measurements: 16.6 x 11.8 x 8.3 cm = volume: 851.9 mL. Echogenicity within normal limits. Multiple simple cysts, the largest measures 10.6 cm. Mild left hydronephrosis. Bladder: Not visualized. Other: Incidental note is made of questionable gallstones. Gallbladder wall is thickened to 3.6 mm. This  could be secondary to cholecystitis or hypoproteinemic states. Mild ascites noted. IMPRESSION: 1. Right renal atrophy is again noted. Previously noted right ureteral stent not visualized by ultrasound. 1.7 cm simple right renal cyst. 2. Multiple simple left renal cysts, the largest measures 10.6 cm. Similar findings noted on prior exam. Mild left hydronephrosis. Bladder is not visualized. 3. Incidental note is made of questionable gallstones. Gallbladder wall is thickened at 3.6 mm. Cholecystitis cannot be excluded. Hypoproteinemic states can also present this fashion. Mild ascites also incidentally noted. Electronically Signed   By: Marcello Moores  Register   On: 07/18/2020 14:49   DG Chest Port 1 View  Result Date: 07/27/2020 CLINICAL DATA:  Acute respiratory failure with hypoxia. EXAM: PORTABLE CHEST 1 VIEW COMPARISON:  07/24/2020. FINDINGS: Tracheostomy tube tip projects at the tracheal air column, just inferior to the clavicular heads, similar to prior. Similar prominent tracheostomy cuff. Right IJ central venous catheter with the tip projecting expected region of the SVC, similar to prior. No substantial change in appearance of layering bilateral pleural effusions with bibasilar consolidation. No visible pneumothorax on this single semi erect AP radiograph. Similar cardiomediastinal silhouette, partially obscured. IMPRESSION: 1. Similar bilateral layering pleural effusions with bibasilar consolidation, better characterized on CT from yesterday. 2. Similar positioning of support devices, detailed above. Similar prominent tracheostomy cuff, potentially overinflated. Electronically Signed   By: Margaretha Sheffield MD   On: 07/27/2020 08:03   DG Chest Port 1 View  Result Date: 07/24/2020 CLINICAL DATA:  Respiratory failure.  Sepsis. EXAM: PORTABLE CHEST 1 VIEW COMPARISON:  Single-view of the chest 07/21/2020 and 07/18/2020. FINDINGS: Tracheostomy tube and right IJ catheter remain in place. Bilateral mid and lower  lung zone airspace disease and effusions persist. Effusion on the right appears increased. No pneumothorax. Heart size is normal. IMPRESSION: Bilateral pleural effusions and mid and lower lung zone airspace disease. Left effusion appears somewhat increased. The appearance of the chest is otherwise unchanged. Electronically Signed   By: Inge Rise M.D.   On: 07/24/2020 14:57   DG Chest Port 1 View  Result Date: 07/21/2020 CLINICAL DATA:  Respiratory failure and pneumonia. EXAM: PORTABLE CHEST 1 VIEW COMPARISON:  Chest x-rays dated 07/18/2020 and 07/17/2020. FINDINGS: Heart size and mediastinal contours are stable. Tubes and lines are stable in position. Patchy bilateral airspace opacities are not significantly changed. No pneumothorax is seen. IMPRESSION: Stable chest x-ray. Patchy bilateral airspace opacities are not significantly changed, compatible with multifocal pneumonia. Electronically Signed   By: Franki Cabot M.D.   On: 07/21/2020 08:12   DG Chest Port 1 View  Result Date: 07/18/2020 CLINICAL DATA:  Status post central line placement. EXAM: PORTABLE CHEST 1 VIEW COMPARISON:  Single-view of the chest 07/17/2020. FINDINGS: Tracheostomy tube is unchanged. Right IJ catheter has been removed. The patient has a new double lumen right IJ catheter with its tip projecting in  the mid to lower superior vena cava. Diffuse bilateral airspace disease and pleural effusions appear unchanged. No pneumothorax. Heart size is normal. IMPRESSION: New dialysis catheter tip projects in the mid to lower superior vena cava. No pneumothorax. No change in bilateral effusions and airspace disease. Electronically Signed   By: Inge Rise M.D.   On: 07/18/2020 14:46   DG CHEST PORT 1 VIEW  Result Date: 07/17/2020 CLINICAL DATA:  Central line. EXAM: PORTABLE CHEST 1 VIEW COMPARISON:  Chest x-ray 07/15/2020. FINDINGS: Right IJ line noted with tip over cavoatrial junction. Tracheostomy tube in stable position. Heart  size stable. Diffuse bilateral pulmonary infiltrates/edema again noted without interim change. Small bilateral pleural effusions may be present. No pneumothorax. IMPRESSION: 1. Right IJ line with noted with tip over cavoatrial junction. Tracheostomy tube in stable position. 2. Diffuse bilateral pulmonary infiltrates/edema again noted without interim change. Small bilateral pleural effusions may be present. Electronically Signed   By: Marcello Moores  Register   On: 07/17/2020 15:27   DG CHEST PORT 1 VIEW  Result Date: 07/15/2020 CLINICAL DATA:  Acute respiratory failure with hypoxia. EXAM: PORTABLE CHEST 1 VIEW COMPARISON:  July 14, 2020. FINDINGS: Stable cardiomediastinal silhouette. Tracheostomy tube is unchanged in position. Stable bilateral lung opacities are noted concerning for pneumonia or edema. Small bilateral pleural effusions are noted. No pneumothorax is noted. Bony thorax is unremarkable. IMPRESSION: Stable bilateral lung opacities as described above. Electronically Signed   By: Marijo Conception M.D.   On: 07/15/2020 08:19   DG CHEST PORT 1 VIEW  Result Date: 07/14/2020 CLINICAL DATA:  Respiratory failure. EXAM: PORTABLE CHEST 1 VIEW COMPARISON:  July 12, 2020. FINDINGS: Stable cardiomediastinal silhouette. Tracheostomy tube is in good position. Stable bilateral lung opacities are noted concerning for edema or pneumonia. Stable bilateral pleural effusions. No definite pneumothorax. Bony thorax is unremarkable. IMPRESSION: Stable bilateral lung opacities described above. Stable bilateral pleural effusions. Electronically Signed   By: Marijo Conception M.D.   On: 07/14/2020 08:10   DG Chest Port 1 View  Result Date: 07/12/2020 CLINICAL DATA:  Pneumonia EXAM: PORTABLE CHEST 1 VIEW COMPARISON:  07/10/2020 FINDINGS: Tracheostomy is in place with the hyperinflated retaining cuff expanding the subglottic airway at the thoracic inlet. Extensive multifocal pulmonary infiltrate is again seen, stable since prior  examination, in keeping with multifocal pneumonic consolidation. More focal opacity at the right apex likely reflects superimposed right upper lobe collapse. Cardiac size within normal limits. Small bilateral pleural effusions are suspected. No pneumothorax. IMPRESSION: Retaining cuff of the tracheostomy expands the airway and revision may be warranted. Stable multifocal pulmonary infiltrates, likely infectious. Stable probable right upper lobe collapse. Stable small bilateral pleural effusions. Electronically Signed   By: Fidela Salisbury MD   On: 07/12/2020 03:42   DG Chest Port 1 View  Result Date: 06/25/2020 CLINICAL DATA:  Acute respiratory failure with hypoxia EXAM: PORTABLE CHEST 1 VIEW COMPARISON:  07/08/2020 FINDINGS: Bilateral worsening airspace disease, right greater than left. Layering bilateral effusions. Heart is normal size. Tracheostomy is unchanged. IMPRESSION: Worsening bilateral airspace disease. Probable layering bilateral effusions. Electronically Signed   By: Rolm Baptise M.D.   On: 07/06/2020 23:35   DG Chest Port 1 View  Result Date: 06/17/2020 CLINICAL DATA:  Shortness of breath EXAM: PORTABLE CHEST 1 VIEW COMPARISON:  12/19/2019 FINDINGS: Patchy bilateral airspace disease, right greater than left. Heart is borderline in size. Layering bilateral pleural effusions. Tracheostomy overlies the upper to mid trachea, stable. IMPRESSION: Patchy bilateral airspace disease concerning for pneumonia.  Layering bilateral effusions. Electronically Signed   By: Rolm Baptise M.D.   On: 06/19/2020 19:23   DG Abd Portable 1V  Result Date: 07/24/2020 CLINICAL DATA:  Diarrhea in a patient with sepsis. EXAM: PORTABLE ABDOMEN - 1 VIEW COMPARISON:  Single-view of the abdomen 10/11/2019. CT abdomen and pelvis 07/17/2020. FINDINGS: The bowel gas pattern is normal. No radio-opaque calculi or other significant radiographic abnormality are seen. PEG tube and right double-J ureteral stent again seen  IMPRESSION: No acute finding. Peg tube and right double-J ureteral stent in place. Electronically Signed   By: Inge Rise M.D.   On: 07/24/2020 14:55   ECHOCARDIOGRAM COMPLETE  Result Date: 07/17/2020    ECHOCARDIOGRAM REPORT   Patient Name:   Luis Orr Date of Exam: 07/17/2020 Medical Rec #:  [id number]         Height:       66.0 in Accession #:    [id number]        Weight:       206.1 lb Date of Birth:  07/10/1950        BSA:          2.026 m Patient Age:    89 years          BP:           92/55 mmHg Patient Gender: M                 HR:           85 bpm. Exam Location:  Inpatient Procedure: 2D Echo, Cardiac Doppler, Color Doppler and Intracardiac            Opacification Agent Indications:    Edema 782.3  History:        Patient has no prior history of Echocardiogram examinations.                 Risk Factors:Hypertension and Diabetes.  Sonographer:    Tiffany Dance Referring Phys: [id number] [facility]  Sonographer Comments: Technically difficult study due to poor echo windows, no subcostal window, echo performed with patient supine and on artificial respirator and no parasternal window. IMPRESSIONS  1. Very technically difficulat study. In one view (contrast A4C) the right ventricle appears moderately enlarged with reduced function. Left ventricular ejection fraction, by estimation, is 50 to 55%. The left ventricle has low normal function. Left ventricular endocardial border not optimally defined to evaluate regional wall motion, even with contrast administration. There is mild concentric left ventricular hypertrophy. Left ventricular diastolic parameters are indeterminate.  2. The mitral valve was not assessed. No evidence of mitral valve regurgitation.  3. The aortic valve was not assessed. Aortic valve regurgitation is not visualized.  4. Aortic dilatation noted. There is mild dilatation of the aortic root, measuring 40 mm. Comparison(s): No prior Echocardiogram.  Conclusion(s)/Recommendation(s): Consider repeat study when able to (non-supine) in clinically indicated. FINDINGS  Left Ventricle: Left ventricular ejection fraction, by estimation, is 50 to 55%. The left ventricle has low normal function. Left ventricular endocardial border not optimally defined to evaluate regional wall motion. Definity contrast agent was given IV  to delineate the left ventricular endocardial borders. The left ventricular internal cavity size was normal in size. There is mild concentric left ventricular hypertrophy. Left ventricular diastolic parameters are indeterminate. Right Ventricle: The right ventricular size is moderately enlarged. Right vetricular wall thickness was not assessed. Right ventricular systolic function is moderately reduced. Left Atrium: Left atrial size was normal in size.  Right Atrium: Right atrial size was normal in size. Pericardium: There is no evidence of pericardial effusion. Mitral Valve: The mitral valve was not assessed. No evidence of mitral valve regurgitation. Tricuspid Valve: The tricuspid valve is not assessed. Tricuspid valve regurgitation is not demonstrated. Aortic Valve: The aortic valve was not assessed. Aortic valve regurgitation is not visualized. Pulmonic Valve: The pulmonic valve was not assessed. Pulmonic valve regurgitation is not visualized. Aorta: Aortic dilatation noted. There is mild dilatation of the aortic root, measuring 40 mm. IAS/Shunts: The interatrial septum was not assessed.  LEFT VENTRICLE PLAX 2D LVIDd:         3.90 cm  Diastology LVIDs:         2.90 cm  LV e' medial:    5.11 cm/s LV PW:         1.20 cm  LV E/e' medial:  14.5 LV IVS:        1.10 cm  LV e' lateral:   4.90 cm/s LVOT diam:     2.10 cm  LV E/e' lateral: 15.1 LV SV:         54 LV SV Index:   27 LVOT Area:     3.46 cm  RIGHT VENTRICLE RV Basal diam:  2.50 cm RV S prime:     9.57 cm/s TAPSE (M-mode): 1.9 cm LEFT ATRIUM           Index      RIGHT ATRIUM          Index LA  diam:      3.20 cm 1.58 cm/m RA Area:     9.68 cm LA Vol (A4C): 7.3 ml  3.59 ml/m RA Volume:   19.70 ml 9.73 ml/m  AORTIC VALVE LVOT Vmax:   85.50 cm/s LVOT Vmean:  58.500 cm/s LVOT VTI:    0.156 m  AORTA Ao Root diam: 4.10 cm MITRAL VALVE MV Area (PHT): 4.15 cm    SHUNTS MV Decel Time: 183 msec    Systemic VTI:  0.16 m MV E velocity: 74.10 cm/s  Systemic Diam: 2.10 cm MV A velocity: 54.00 cm/s MV E/A ratio:  1.37 Rudean Haskell MD Electronically signed by Rudean Haskell MD Signature Date/Time: 07/17/2020/3:38:41 PM    Final    US Abdomen Limited RUQ (LIVER/GB)  Result Date: 07/25/2020 CLINICAL DATA:  Transaminitis. History of hypertension, diabetes and chronic kidney disease. EXAM: ULTRASOUND ABDOMEN LIMITED RIGHT UPPER QUADRANT COMPARISON:  Renal ultrasound 07/18/2020.  Abdominal CT 07/17/2020. FINDINGS: Gallbladder: The gallbladder is difficult to confidently identify. There is shadowing in the subhepatic space which could reflect the presence of multiple gallstones or adjacent bowel gas. The gallbladder was present on recent CT with questionable stones. Unable to visualize the gallbladder wall. Unable to assess for sonographic Murphy's sign due to patient unresponsiveness. Common bile duct: Diameter: 4 mm Liver: The liver is heterogeneous in echotexture with mild contour irregularity, suggesting cirrhosis. No focal lesions are identified. Portal vein is patent on color Doppler imaging with normal direction of blood flow towards the liver. Other: The previously identified ascites appears improved. There is a small right pleural effusion. Right renal atrophy and right renal cyst are noted incidentally. Study is technically difficult. IMPRESSION: 1. Limited study due to body habitus and reported patient unresponsiveness. 2. Echogenic shadowing structure in the right upper quadrant suspicious for gallbladder filled with stones (wall echo shadow sign). No evidence of biliary dilatation. 3.  Heterogeneous hepatic parenchyma without demonstrated focal abnormality. Mild perihepatic ascites. Electronically Signed  By: Richardean Sale M.D.   On: 07/25/2020 17:21    Labs:  CBC: Recent Labs    07/31/20 0512 08/01/20 0440 08/02/20 0406 08/03/20 0527  WBC 11.5* 13.1* 15.3* 21.4*  HGB 6.9* 8.5* 8.8* 7.9*  HCT 21.0* 24.9* 25.6* 23.6*  PLT 246 231 223 215    COAGS: Recent Labs    07/18/20 0250 07/18/20 2108 07/22/20 0302 07/23/20 0247 07/24/20 0306 07/25/20 0319  INR  --   --  1.6* 1.5* 1.5* 1.9*  APTT 37* 76*  --   --   --   --     BMP: Recent Labs    10/09/19 0244 10/10/19 0245 10/10/19 1426 10/11/19 0226 12/19/19 0710 08/01/20 0440 08/02/20 0406 08/03/20 0527 08/04/20 0329  NA 131* 133* 135 136   < > 132* 133* 136 134*  K 3.3* 2.4* 2.8* 4.1   < > 3.3* 3.8 3.0* 4.9  CL 103 102 105 105   < > 96* 95* 98 94*  CO2 16* 21* 19* 21*   < > '22 22 26 25  '$ GLUCOSE 254* 129* 79 70   < > 120* 120* 117* 139*  BUN 11 6* 7* 5*   < > 131* 157* 72* 98*  CALCIUM 8.2* 8.4* 8.4* 8.4*   < > 8.3* 8.5* 8.4* 8.9  CREATININE 0.47* 0.41* 0.40* <0.30*   < > 1.26* 1.40* 0.88 1.12  GFRNONAA >60 >60 >60 NOT CALCULATED   < > >60 54* >60 >60  GFRAA >60 >60 >60 NOT CALCULATED  --   --   --   --   --    < > = values in this interval not displayed.    LIVER FUNCTION TESTS: Recent Labs    07/26/20 0500 07/26/20 1516 07/27/20 0354 07/28/20 0009 07/29/20 0503 07/29/20 1122 08/01/20 0440 08/02/20 0406 08/03/20 0527 08/04/20 0329  BILITOT 1.0  --  0.9  --  1.1  --  1.0  --   --   --   AST 341*  --  289*  --  143*  --  60*  --   --   --   ALT 142*  --  137*  --  107*  --  69*  --   --   --   ALKPHOS 451*  --  439*  --  347*  --  254*  --   --   --   PROT 7.6  --  7.8  --  6.8  --  6.2*  --   --   --   ALBUMIN 2.0*  1.8*   < > 3.1*   < > 2.4*  2.4*   < > 2.2*  2.2* 2.3* 2.4* 2.4*   < > = values in this interval not displayed.    TUMOR MARKERS: No results for input(s): AFPTM,  CEA, CA199, CHROMGRNA in the last 8760 hours.  Assessment and Plan: 69 y.o. male with ESRD in need of long term access for HD.  Of note, patient is a not a good candidate for long term RRT due to poor functional status and vent dependent. After thorough discussion with both nephrology and critical care, family wished to proceed with HD.   IR was requested for image guided tunneled HD catheter placement early next week.   The procedure is tentatively scheduled for Tuesday AM pending IR schedule.  Made NPO Tuesday MN. Ancef 2 g ordered. VS persistent tachypena, on vent. Afebrile.  Repeat blood cx showed no  growth x 5 days.  On sq heparin QID - no need for hold per IR anticoag protocol but INR 1.9 on 7/13. Held Tuesday 6 am does.  CBC showed persistency elevated but stable  WBC utill yesterday, WBC increased from 15.3 yesterday to 21.4. WBC 18.5 today. Hgb 8.1, plt could not be estimated. Plt 215 on 7/22.   Patient not capable of making his own medical condition at this time, case discussed with his wife Mrs. June Rehmann over phone.   Risks and benefits discussed with the patient's wife including, but not limited to bleeding, infection, vascular injury, pneumothorax which may require chest tube placement, air embolism or even death  All of the wife's questions were answered, patient is agreeable to proceed. Consent signed and in chart.      Thank you for this interesting consult.  I greatly enjoyed meeting RAMONTE FECTEAU and look forward to participating in their care.  A copy of this report was sent to the requesting provider on this date.  Electronically Signed: Tera Mater, PA-C 08/04/2020, 12:18 PM   I spent a total of  30 Minutes   in face to face in clinical consultation, greater than 50% of which was counseling/coordinating care for TCD placement

## 2020-08-04 NOTE — Progress Notes (Signed)
PROGRESS NOTE    Luis Orr  HYW:[id number] DOB: Aug 09, 1950 DOA: 06/28/2020 PCP: Townsend Roger, MD   Brief Narrative: Luis Orr is a 70 y.o. 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 Patient presented secondary to altered mental status and hypotension with recurrent pneumonia requiring vasopressor support and ICU admission.   ICU significant events: 6/29 admitted to ICU for septic shock on levo to maintain MAP>65, continued on vent support 6/29 Blood Cx>> staph species in 1 of 4 cultures drawn>> suspect contaminant 6/30 Off pressors 7/2 ID Consult for MDR acinetobacter in trach asp, Meropenem changed to unasyn 7/2 PRBC transfusion, iron replacement 7/5 Pressors added again. Central line placed. Drainage from gastrostomy tube >> CT Abd without fluid collection around gastrostomy tube but with diffuse body wall edema. Echo EF 50-55%, mild LVH 7/6 Renal Replacement Therapy ; Code status changed to partial Code (DNR) 7/7 Transfuse 1U PRBC 7/9 add solu cortef 7/10 off pressors, weaning stress steroids 7/11 Pressors added back on again 7/12 ID consult due to rising WBC, hypothermia, hypotension concern for worsening infection. Vancomycin added. Repeat blood cx collected. 7/15 CRRT stopped 7/17 Restarted on low dose levo. No evidence of new infection. Held off on antibiotics. Nephrology contacted family and decision was made to continue iHD this week to allow additional time for renal recovery. Patient is not a long term iHD candidate 7/18 plans for iHD, tolerated with 2L removed 719 slight drop in hgb to 6.9 will transfuse 1 unit PRBC   Assessment & Plan:   Active Problems:   Septic shock (HCC)   Pressure injury of skin   Acute respiratory failure with hypoxia (HCC)   AKI (acute kidney injury) ([place])   Hypotension   Acute on chronic respiratory failure with hypoxia and hypercapnia Secondary to  pneumonia/volume overload. Now resolved and back to baseline. Currently on ventilator support per PCCM.  Ventilator associated pneumonia Present on admission. Sputum culture significant for acinetobacter baumannii. Patient was treated with Unasyn x14 days. Completed course.  Leukocytosis Secondary to above infection and sepsis. Significantly improved but now worsened. -Repeat CBC  Volume overload Secondary to renal failure with anuria. Patient has improved with CRRT and now iHD.  AKI with oliguria Now appears patient is anuric. No documented urine output from last 24 hours. Nephrology is on board and was initially managing with CRRT and have transitioned to intermittent HD, however patient is not a long term iHD candidate. Palliative care has been consulted.  Septic shock Present on admission. Secondary to pneumonia. Patient required vasopressor support which is now weaned off. Resolved. Initial blood culture significant for likely contaminant. Repeat blood cultures negative.  Acute metabolic encephalopathy Secondary to acute illness. CT head negative for acute process. Appears improved and likely back at baseline.  Diabetes mellitus, type 2 Insulin needs have decreased. 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 Improving.  Quadriplegia Secondary to compressive myelopathy. Patient resides at [facility] but per chart review, [facility] will not accept him back if he is HD dependent.  Microcytic anemia Acute anemia Patient required 4 units of PRBC to date. Thought to be secondary to a combination of acute illness, chronic disease and iron deficiency. IV iron given. -CBC in AM  Severe malnutrition -Continue tube feeds  Paroxysmal atrial fibrillation -Continue amiodarone  Pressure injury Medial/posterior  sacrum present on admission.  Right/posterior/lateral ankle unknown if present on admission. Left ear, not present on admission.   DVT prophylaxis: Heparin Code Status:   Code Status: Partial Code Family Communication: None at bedside Disposition Plan: Discharge complicated. Patient is from [facility] and cannot return secondary to new HD requirement. Consideration for admission to [facility]. Otherwise, patient and family will need to consider out-of-state options.   Consultants:  PCCM Nephrology Infectious disease Palliative care medicine  Procedures:    Antimicrobials: Unasyn IV Vancomycin Meropenem Cefepime   Subjective: No issues noted overnight.  Objective: Vitals:   08/04/20 1000 08/04/20 1100 08/04/20 1118 08/04/20 1200  BP: 113/65 103/63  (!) 88/62  Pulse: 78 80  75  Resp:    (!) 30  Temp:   98.7 F (37.1 C)   TempSrc:   Axillary   SpO2:    96%  Weight:      Height:        Intake/Output Summary (Last 24 hours) at 08/04/2020 1349 Last data filed at 08/04/2020 1127 Gross per 24 hour  Intake 900 ml  Output 4600 ml  Net -3700 ml    Filed Weights   08/02/20 2050 08/03/20 0500 08/04/20 0500  Weight: 82.6 kg 84.3 kg 79.9 kg    Examination:  General exam: Appears calm and comfortable Respiratory system: Clear to auscultation today. Respiratory effort normal. Cardiovascular system: S1 & S2 heard, RRR. No murmurs, rubs, gallops or clicks. Gastrointestinal system: Abdomen is nondistended, soft and nontender. No organomegaly or masses felt. Normal bowel sounds heard. Central nervous system: Asleep Musculoskeletal: Bilateral upper/lower extremity edema (2+ pitting).Skin: No cyanosis. No rashes   Data Reviewed: I have personally reviewed following labs and imaging studies  CBC Lab Results  Component Value Date   WBC 21.4 (H) 08/03/2020   RBC 3.05 (L) 08/03/2020   HGB 7.9 (L) 08/03/2020   HCT 23.6 (L) 08/03/2020   MCV 77.4 (L) 08/03/2020   MCH 25.9 (L) 08/03/2020   PLT 215  08/03/2020   MCHC 33.5 08/03/2020   RDW 28.2 (H) 08/03/2020   LYMPHSABS 1.3 07/24/2020   MONOABS 1.7 (H) 07/24/2020   EOSABS 0.0 07/24/2020   BASOSABS 0.1 56/31/4970     Last metabolic panel Lab Results  Component Value Date   NA 134 (L) 08/04/2020   K 4.9 08/04/2020   CL 94 (L) 08/04/2020   CO2 25 08/04/2020   BUN 98 (H) 08/04/2020   CREATININE 1.12 08/04/2020   GLUCOSE 139 (H) 08/04/2020   GFRNONAA >60 08/04/2020   GFRAA NOT CALCULATED 10/11/2019   CALCIUM 8.9 08/04/2020   PHOS 4.9 (H) 08/04/2020   PROT 6.2 (L) 08/01/2020   ALBUMIN 2.4 (L) 08/04/2020   BILITOT 1.0 08/01/2020   ALKPHOS 254 (H) 08/01/2020   AST 60 (H) 08/01/2020   ALT 69 (H) 08/01/2020   ANIONGAP 15 08/04/2020    CBG (last 3)  Recent Labs    08/04/20 0328 08/04/20 0749 08/04/20 1113  GLUCAP 129* 212* 192*      GFR: Estimated Creatinine Clearance: 60.9 mL/min (by C-G formula based on SCr of 1.12 mg/dL).  Coagulation Profile: No results for input(s): INR, PROTIME in the last 168 hours.  No results found for this or any previous visit (from the past 240 hour(s)).       Radiology Studies: No results found.      Scheduled Meds:  amiodarone  200 mg Per Tube Daily   buPROPion  75 mg Per Tube BID  chlorhexidine gluconate (MEDLINE KIT)  15 mL Mouth Rinse BID   Chlorhexidine Gluconate Cloth  6 each Topical Q0600   [START ON 08/08/2020] darbepoetin (ARANESP) injection - DIALYSIS  150 mcg Intravenous Q Wed-HD   docusate  100 mg Per Tube BID   feeding supplement (PROSource TF)  45 mL Per Tube TID   guaiFENesin  5 mL Per Tube BID   heparin injection (subcutaneous)  5,000 Units Subcutaneous Q8H   insulin aspart  2 Units Subcutaneous Q4H   insulin glargine  10 Units Subcutaneous Daily   mouth rinse  15 mL Mouth Rinse 10 times per day   midodrine  10 mg Per Tube TID   multivitamin  1 tablet Per Tube QHS   pantoprazole sodium  40 mg Per Tube QHS   polyethylene glycol  17 g Per Tube Daily    sertraline  100 mg Per Tube Daily   sodium chloride flush  10-40 mL Intracatheter Q12H   Continuous Infusions:  sodium chloride Stopped (07/26/20 0824)   sodium chloride 250 mL (07/31/20 1428)   sodium chloride     sodium chloride     sodium chloride     feeding supplement (NEPRO CARB STEADY) 1,000 mL (08/03/20 1551)     LOS: 24 days     Cordelia Poche, MD [facility] 08/04/2020, 1:49 PM  If 7PM-7AM, please contact night-coverage [email or web address]

## 2020-08-04 NOTE — Progress Notes (Signed)
[facility] Progress Note    Assessment/ Plan:   AKI -baseline Cr 0.4. AKI due to ATN in the context of shock. CRRT initiated on 7/6 for anasarca with ineffective diuresis and uremia.  CCM for rij temp line placement 75/43- 47 days old - CRRT 7/6 - 7/15, now off due to improved hemodynamics -Not a good long term candidate for HD due to poor functional status, vent dependency. Dr. Candiss Norse voiced this concern to both son and wife over the phone on 7/6 and they wished to proceed forward with RRT.  Appreciate palliative care's assistance.  Dr. Johnney Ou discussed concern for long term dialysis to wife and son 7/12 and again on 7/17.  The plan was continue iHD through this week given he's out of shock phase but if no signs of recovery then reconvene.  He already had been living at [facility] for several years prior to this admission.  Palliative care has been meeting with family daily and family not willing to stop dialysis at this point.   Had IHD early this AM- next treatment planned for Monday.  consult has been placed to IR for a tunneled HD cath hopefully early next week   Hyperkalemia shifted and lokelma given PRN, HD today 2K. Is good today again-  has been good for a while now - labs were done in the middle of HD  CKD-MBD, hyperphosphatemia -phos had been ok with CRRT, then up, on nepro, started renvela-  have stopped as is not usually effective in this situation and least of our concerns  Acute metabolic encephalopathy, likely related to sepsis and uremia:   CT head unrevealing, seems to be improving   Septic shock:  resolved -off pressors now -off abx as of 7/15  Chronic respiratory failure s/p trach -vent mgmt per ccm  Quadriplegia 2/2 compressive myelopathy -resides at [facility] P[facility] -appreciate palliative care's assistance-  [facility] will not take him back if he is dialysis dependent  Microcytic anemia -transfuse for hgb <7. give iron load as severely iron deficient on 6/30  labs (t sat 5%) - last given blood on 7/19. on ESA  Dispo:  with comorbids not a good long term dialysis candidate.  Family wish to continue current care.   He is technically tolerating IHD -  continuing HD this week with old non tunneled cath.  Based on previous conversations I think family will want to continue with HD-  finding a place for him to go post hosp will be very difficult - likely req out of state facility-  that may be the only card we can play to get the family to realize this is not the best idea for him.  Will place consult for IR to place a TDC as that is where we are going and would hate for him to get septic again with an old temp cath   Subjective:   Hypothermic-  no uop really recorded  -  hemodynamically stable - ended up getting dialysis early this AM-  removed 4.5 liters ?   Objective:   BP (!) 155/90   Pulse 97   Temp (!) 97.4 F (36.3 C) (Axillary)   Resp (!) 30   Ht 5' 6"  (1.676 m)   Wt 84.3 kg   SpO2 94%   BMI 30.00 kg/m   Intake/Output Summary (Last 24 hours) at 08/04/2020 0658 Last data filed at 08/04/2020 0500 Gross per 24 hour  Intake 894.96 ml  Output 245 ml  Net 649.96 ml  Weight change:   Physical Exam: [id number] ill appearing-  eyes open-  seems to understand what I am saying -  temp cath in for 16 days  CVS:s1s2, rrr Resp:trach, bl chest expansion, on full vent support GPQ:DIYM Ext:  2+ diffuse edema but better  Neuro: functional quadriplegic, opening eyes, looking around   Imaging: No results found.  Labs: BMET Recent Labs  Lab 07/29/20 1122 07/29/20 1419 07/30/20 0540 07/30/20 1054 07/30/20 2024 07/31/20 0014 07/31/20 0512 08/01/20 0440 08/02/20 0406 08/03/20 0527 08/04/20 0329  NA 135  --  135  --   --   --  135 132* 133* 136 134*  K 5.4*   < > 5.8*   < > 3.3* 3.2* 3.8 3.3* 3.8 3.0* 4.9  CL 101  --  101  --   --   --  96* 96* 95* 98 94*  CO2 19*  --  20*  --   --   --  24 22 22 26 25   GLUCOSE 283*  --  339*  --    --   --  145* 120* 120* 117* 139*  BUN 142*  --  165*  --   --   --  94* 131* 157* 72* 98*  CREATININE 1.15  --  1.39*  --   --   --  0.87 1.26* 1.40* 0.88 1.12  CALCIUM 8.3*  --  7.8*  --   --   --  8.3* 8.3* 8.5* 8.4* 8.9  PHOS 9.7*  --  11.1*  --   --   --  6.3* 7.6* 8.5* 3.9 4.9*   < > = values in this interval not displayed.   CBC Recent Labs  Lab 07/31/20 0512 08/01/20 0440 08/02/20 0406 08/03/20 0527  WBC 11.5* 13.1* 15.3* 21.4*  HGB 6.9* 8.5* 8.8* 7.9*  HCT 21.0* 24.9* 25.6* 23.6*  MCV 75.3* 75.5* 76.2* 77.4*  PLT 246 231 223 215    Medications:     amiodarone  200 mg Per Tube Daily   buPROPion  75 mg Per Tube BID   chlorhexidine gluconate (MEDLINE KIT)  15 mL Mouth Rinse BID   Chlorhexidine Gluconate Cloth  6 each Topical Q0600   [START ON 08/08/2020] darbepoetin (ARANESP) injection - DIALYSIS  150 mcg Intravenous Q Wed-HD   docusate  100 mg Per Tube BID   feeding supplement (PROSource TF)  45 mL Per Tube TID   guaiFENesin  5 mL Per Tube BID   heparin injection (subcutaneous)  5,000 Units Subcutaneous Q8H   insulin aspart  2 Units Subcutaneous Q4H   insulin glargine  10 Units Subcutaneous Daily   mouth rinse  15 mL Mouth Rinse 10 times per day   midodrine  10 mg Per Tube TID   multivitamin  1 tablet Per Tube QHS   pantoprazole sodium  40 mg Per Tube QHS   polyethylene glycol  17 g Per Tube Daily   sertraline  100 mg Per Tube Daily   sodium chloride flush  10-40 mL Intracatheter Q12H     [facility]

## 2020-08-05 DIAGNOSIS — Z7189 Other specified counseling: Secondary | ICD-10-CM | POA: Diagnosis not present

## 2020-08-05 DIAGNOSIS — N179 Acute kidney failure, unspecified: Secondary | ICD-10-CM | POA: Diagnosis not present

## 2020-08-05 DIAGNOSIS — Z515 Encounter for palliative care: Secondary | ICD-10-CM | POA: Diagnosis not present

## 2020-08-05 DIAGNOSIS — J9601 Acute respiratory failure with hypoxia: Secondary | ICD-10-CM | POA: Diagnosis not present

## 2020-08-05 LAB — CBC
HCT: 24.9 % — ABNORMAL LOW (ref 39.0–52.0)
Hemoglobin: 8.1 g/dL — ABNORMAL LOW (ref 13.0–17.0)
MCH: 26.4 pg (ref 26.0–34.0)
MCHC: 32.5 g/dL (ref 30.0–36.0)
MCV: 81.1 fL (ref 80.0–100.0)
Platelets: UNDETERMINED 10*3/uL (ref 150–400)
RBC: 3.07 MIL/uL — ABNORMAL LOW (ref 4.22–5.81)
RDW: 27.4 % — ABNORMAL HIGH (ref 11.5–15.5)
WBC: 18.5 10*3/uL — ABNORMAL HIGH (ref 4.0–10.5)
nRBC: 0.8 % — ABNORMAL HIGH (ref 0.0–0.2)

## 2020-08-05 LAB — RENAL FUNCTION PANEL
Albumin: 2.3 g/dL — ABNORMAL LOW (ref 3.5–5.0)
Anion gap: 12 (ref 5–15)
BUN: 74 mg/dL — ABNORMAL HIGH (ref 8–23)
CO2: 24 mmol/L (ref 22–32)
Calcium: 8.7 mg/dL — ABNORMAL LOW (ref 8.9–10.3)
Chloride: 97 mmol/L — ABNORMAL LOW (ref 98–111)
Creatinine, Ser: 0.85 mg/dL (ref 0.61–1.24)
GFR, Estimated: >60 mL/min (ref >60)
Glucose, Bld: 142 mg/dL — ABNORMAL HIGH (ref 70–99)
Phosphorus: 4.7 mg/dL — ABNORMAL HIGH (ref 2.5–4.6)
Potassium: 4.1 mmol/L (ref 3.5–5.1)
Sodium: 133 mmol/L — ABNORMAL LOW (ref 135–145)

## 2020-08-05 LAB — MAGNESIUM: Magnesium: 2.1 mg/dL (ref 1.7–2.4)

## 2020-08-05 LAB — GLUCOSE, CAPILLARY
Glucose-Capillary: 116 mg/dL — ABNORMAL HIGH (ref 70–99)
Glucose-Capillary: 120 mg/dL — ABNORMAL HIGH (ref 70–99)
Glucose-Capillary: 130 mg/dL — ABNORMAL HIGH (ref 70–99)
Glucose-Capillary: 139 mg/dL — ABNORMAL HIGH (ref 70–99)
Glucose-Capillary: 142 mg/dL — ABNORMAL HIGH (ref 70–99)
Glucose-Capillary: 159 mg/dL — ABNORMAL HIGH (ref 70–99)

## 2020-08-05 MED ORDER — CEFAZOLIN SODIUM-DEXTROSE 2-4 GM/100ML-% IV SOLN: 2.0000 g | INTRAVENOUS | Status: DC

## 2020-08-05 MED ORDER — CEFAZOLIN SODIUM-DEXTROSE 2-4 GM/100ML-% IV SOLN
2.0000 g | INTRAVENOUS | Status: AC
  Administered 2020-08-06: 2 g via INTRAVENOUS
  Filled 2020-08-05: qty 100

## 2020-08-05 NOTE — Plan of Care (Signed)
  Problem: Education: Goal: Knowledge of General Education information will improve Description Including pain rating scale, medication(s)/side effects and non-pharmacologic comfort measures Outcome: Progressing   Problem: Health Behavior/Discharge Planning: Goal: Ability to manage health-related needs will improve Outcome: Progressing   

## 2020-08-05 NOTE — Progress Notes (Signed)
[facility] Progress Note    Assessment/ Plan:   AKI -baseline Cr 0.4. AKI due to ATN /shock. CRRT initiated on 7/6 for anasarca with ineffective diuresis and uremia.  CCM for rij temp line placement 58/52- 60 days old - CRRT 7/6 - 7/15, now off due to improved hemodynamics -Not a good long term candidate for HD due to poor functional status, vent dependency. Dr. Candiss Norse voiced this concern to both son and wife over the phone on 7/6 and they wished to proceed with RRT.  Appreciate palliative care's assistance.  Dr. Johnney Ou discussed concern for long term dialysis to wife and son 7/12 and again on 7/17.  The plan was continue iHD through this past week given he's out of shock phase but if no signs of recovery then reconvene.  He already had been living at [facility] for several years prior to this admission.  Palliative care has been meeting with family daily and family not willing to stop dialysis at this point.   Had IHD early Sat AM- next treatment planned for Tuesday.  consult has been placed to IR for a tunneled HD cath hopefully early next week   Hyperkalemia shifted and lokelma given PRN, HD today 2K. Is good today again-  has been good for a while now - labs were done in the middle of HD  CKD-MBD, hyperphosphatemia -phos had been ok with CRRT, then up, on nepro, started renvela-  have stopped as is not usually effective in this situation and least of our concerns  Acute metabolic encephalopathy, likely related to sepsis and uremia:   CT head unrevealing, seems to be improving   Septic shock:  resolved -off pressors now -off abx as of 7/15  Chronic respiratory failure s/p trach -vent mgmt per ccm  Quadriplegia 2/2 compressive myelopathy -resides at [facility] P[facility] -appreciate palliative care's assistance-  [facility] will not take him back if he is dialysis dependent  Microcytic anemia -transfuse for hgb <7. give iron load as severely iron deficient on 6/30 labs (t sat 5%) -  last given blood on 7/19. on ESA  Dispo:  with comorbids not a good long term dialysis candidate.  Family wish to continue current care.   He is technically tolerating IHD -  continuing HD this past week with old non tunneled cath.  Based on previous conversations I think family will want to continue with HD-  finding a place for him to go post hosp will be very difficult - likely req out of state facility-  that may be the only card we can play to get the family to realize this is not the best idea for him.  consult placed for IR to place a TDC as that is where we are going and would hate for him to get septic again with an old temp cath   Subjective:   Hypothermic-  no uop really recorded  -  hemodynamically stable-  no new events    Objective:   BP 136/76   Pulse 73   Temp 98.2 F (36.8 C) (Oral)   Resp (!) 30   Ht 5' 6"  (1.676 m)   Wt 79.3 kg   SpO2 99%   BMI 28.22 kg/m   Intake/Output Summary (Last 24 hours) at 08/05/2020 0659 Last data filed at 08/05/2020 0600 Gross per 24 hour  Intake 1080 ml  Output 175 ml  Net 905 ml   Weight change: -0.6 kg  Physical Exam: [id number] ill appearing-  eyes open-  seems to understand what I am saying -  temp cath in for 17 days  CVS:s1s2, rrr Resp:trach, bl chest expansion, on full vent support GYF:VCBS Ext:  2+ diffuse edema but better  Neuro: functional quadriplegic, opening eyes, looking around   Imaging: No results found.  Labs: BMET Recent Labs  Lab 07/30/20 0540 07/30/20 1054 07/31/20 0014 07/31/20 4967 08/01/20 0440 08/02/20 0406 08/03/20 0527 08/04/20 0329 08/05/20 0352  NA 135  --   --  135 132* 133* 136 134* 133*  K 5.8*   < > 3.2* 3.8 3.3* 3.8 3.0* 4.9 4.1  CL 101  --   --  96* 96* 95* 98 94* 97*  CO2 20*  --   --  24 22 22 26 25 24   GLUCOSE 339*  --   --  145* 120* 120* 117* 139* 142*  BUN 165*  --   --  94* 131* 157* 72* 98* 74*  CREATININE 1.39*  --   --  0.87 1.26* 1.40* 0.88 1.12 0.85  CALCIUM 7.8*   --   --  8.3* 8.3* 8.5* 8.4* 8.9 8.7*  PHOS 11.1*  --   --  6.3* 7.6* 8.5* 3.9 4.9* 4.7*   < > = values in this interval not displayed.   CBC Recent Labs  Lab 08/02/20 0406 08/03/20 0527 08/04/20 1408 08/05/20 0352  WBC 15.3* 21.4* 19.2* 18.5*  HGB 8.8* 7.9* 8.7* 8.1*  HCT 25.6* 23.6* 26.8* 24.9*  MCV 76.2* 77.4* 82.2 81.1  PLT 223 215 PLATELET CLUMPS NOTED ON SMEAR, UNABLE TO ESTIMATE PLATELET CLUMPS NOTED ON SMEAR, UNABLE TO ESTIMATE    Medications:     amiodarone  200 mg Per Tube Daily   buPROPion  75 mg Per Tube BID   chlorhexidine gluconate (MEDLINE KIT)  15 mL Mouth Rinse BID   Chlorhexidine Gluconate Cloth  6 each Topical Q0600   [START ON 08/08/2020] darbepoetin (ARANESP) injection - DIALYSIS  150 mcg Intravenous Q Wed-HD   docusate  100 mg Per Tube BID   feeding supplement (PROSource TF)  45 mL Per Tube TID   guaiFENesin  5 mL Per Tube BID   heparin injection (subcutaneous)  5,000 Units Subcutaneous Q8H   insulin aspart  2 Units Subcutaneous Q4H   insulin glargine  10 Units Subcutaneous Daily   mouth rinse  15 mL Mouth Rinse 10 times per day   midodrine  10 mg Per Tube TID   multivitamin  1 tablet Per Tube QHS   pantoprazole sodium  40 mg Per Tube QHS   polyethylene glycol  17 g Per Tube Daily   sertraline  100 mg Per Tube Daily   sodium chloride flush  10-40 mL Intracatheter Q12H     [facility]

## 2020-08-05 NOTE — Progress Notes (Signed)
PROGRESS NOTE    Luis Orr  KNL:[id number] DOB: 01-29-1950 DOA: 06/23/2020 PCP: Townsend Roger, MD   Brief Narrative: Luis Orr is a 70 y.o. 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 Patient presented secondary to altered mental status and hypotension with recurrent pneumonia requiring vasopressor support and ICU admission.   ICU significant events: 6/29 admitted to ICU for septic shock on levo to maintain MAP>65, continued on vent support 6/29 Blood Cx>> staph species in 1 of 4 cultures drawn>> suspect contaminant 6/30 Off pressors 7/2 ID Consult for MDR acinetobacter in trach asp, Meropenem changed to unasyn 7/2 PRBC transfusion, iron replacement 7/5 Pressors added again. Central line placed. Drainage from gastrostomy tube >> CT Abd without fluid collection around gastrostomy tube but with diffuse body wall edema. Echo EF 50-55%, mild LVH 7/6 Renal Replacement Therapy ; Code status changed to partial Code (DNR) 7/7 Transfuse 1U PRBC 7/9 add solu cortef 7/10 off pressors, weaning stress steroids 7/11 Pressors added back on again 7/12 ID consult due to rising WBC, hypothermia, hypotension concern for worsening infection. Vancomycin added. Repeat blood cx collected. 7/15 CRRT stopped 7/17 Restarted on low dose levo. No evidence of new infection. Held off on antibiotics. Nephrology contacted family and decision was made to continue iHD this week to allow additional time for renal recovery. Patient is not a long term iHD candidate 7/18 plans for iHD, tolerated with 2L removed 719 slight drop in hgb to 6.9 will transfuse 1 unit PRBC   Assessment & Plan:   Active Problems:   Septic shock (HCC)   Pressure injury of skin   Acute respiratory failure with hypoxia (HCC)   AKI (acute kidney injury) ([place])   Hypotension   Acute on chronic respiratory failure with hypoxia and hypercapnia Secondary to  pneumonia/volume overload. Now resolved and back to baseline. Currently on ventilator support per PCCM.  Ventilator associated pneumonia Present on admission. Sputum culture significant for acinetobacter baumannii. Patient was treated with Unasyn x14 days. Completed course.  Leukocytosis Secondary to above infection and sepsis. Significantly improved; worsened slightly but now seems to be tapering off. -Repeat CBC  Volume overload Secondary to renal failure with anuria. Patient has improved with CRRT and now iHD.  AKI with oliguria Now appears patient is anuric. No documented urine output from last 24 hours. Nephrology is on board and was initially managing with CRRT and have transitioned to intermittent HD, however patient is not a long term iHD candidate. Palliative care has been consulted.  Septic shock Present on admission. Secondary to pneumonia. Patient required vasopressor support which is now weaned off. Resolved. Initial blood culture significant for likely contaminant. Repeat blood cultures negative.  Acute metabolic encephalopathy Secondary to acute illness. CT head negative for acute process. Appears improved and likely back at baseline.  Diabetes mellitus, type 2 Insulin needs have decreased. 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 Improving.  Quadriplegia Secondary to compressive myelopathy. Patient resides at [place] but per chart review, [facility] will not accept him back if he is HD dependent.  Microcytic anemia Acute anemia Patient required 4 units of PRBC to date. Thought to be secondary to a combination of acute illness, chronic disease and iron deficiency. IV iron given. -CBC in AM  Severe malnutrition -Continue tube feeds  Paroxysmal atrial fibrillation -  Continue amiodarone  Pressure  injury Medial/posterior sacrum present on admission. Right/posterior/lateral ankle unknown if present on admission. Left ear, not present on admission.   DVT prophylaxis: Heparin Code Status:   Code Status: Partial Code Family Communication: Wife on telephone (8 minutes 28 sec) Disposition Plan: Discharge complicated. Patient is from [facility] [facility] and cannot return secondary to new HD requirement. Consideration for admission to [facility]. Otherwise, patient and family will need to consider out-of-state options.   Consultants:  PCCM Nephrology Infectious disease Palliative care medicine  Procedures:    Antimicrobials: Unasyn IV Vancomycin Meropenem Cefepime   Subjective: Non-verbal. No issues noted overnight.  Objective: Vitals:   08/05/20 1100 08/05/20 1132 08/05/20 1150 08/05/20 1200  BP: 123/72  123/72 (!) 145/79  Pulse: 63  63 63  Resp:   (!) 30 (!) 30  Temp:  98 F (36.7 C)    TempSrc:  Axillary    SpO2:   100% 100%  Weight:      Height:        Intake/Output Summary (Last 24 hours) at 08/05/2020 1240 Last data filed at 08/05/2020 0800 Gross per 24 hour  Intake 810 ml  Output 200 ml  Net 610 ml    Filed Weights   08/03/20 0500 08/04/20 0500 08/05/20 0500  Weight: 84.3 kg 79.9 kg 79.3 kg    Examination:  General exam: Appears calm and comfortable Respiratory system: Mild rhonchi. Respiratory effort normal. Cardiovascular system: S1 & S2 heard, RRR. No murmurs, rubs, gallops or clicks. Gastrointestinal system: Abdomen is nondistended, soft and nontender. No organomegaly or masses felt. Normal bowel sounds heard. Central nervous system: Alert and oriented. No focal neurological deficits. Musculoskeletal: Bilateral upper/lower extremity edema. Skin: No cyanosis. No rashes Psychiatry: Judgement and insight appear normal. Mood & affect appropriate.    Data Reviewed: I have personally reviewed following labs and imaging studies  CBC Lab Results   Component Value Date   WBC 18.5 (H) 08/05/2020   RBC 3.07 (L) 08/05/2020   HGB 8.1 (L) 08/05/2020   HCT 24.9 (L) 08/05/2020   MCV 81.1 08/05/2020   MCH 26.4 08/05/2020   PLT PLATELET CLUMPS NOTED ON SMEAR, UNABLE TO ESTIMATE 08/05/2020   MCHC 32.5 08/05/2020   RDW 27.4 (H) 08/05/2020   LYMPHSABS 1.3 07/24/2020   MONOABS 1.7 (H) 07/24/2020   EOSABS 0.0 07/24/2020   BASOSABS 0.1 68/12/7515     Last metabolic panel Lab Results  Component Value Date   NA 133 (L) 08/05/2020   K 4.1 08/05/2020   CL 97 (L) 08/05/2020   CO2 24 08/05/2020   BUN 74 (H) 08/05/2020   CREATININE 0.85 08/05/2020   GLUCOSE 142 (H) 08/05/2020   GFRNONAA >60 08/05/2020   GFRAA NOT CALCULATED 10/11/2019   CALCIUM 8.7 (L) 08/05/2020   PHOS 4.7 (H) 08/05/2020   PROT 6.2 (L) 08/01/2020   ALBUMIN 2.3 (L) 08/05/2020   BILITOT 1.0 08/01/2020   ALKPHOS 254 (H) 08/01/2020   AST 60 (H) 08/01/2020   ALT 69 (H) 08/01/2020   ANIONGAP 12 08/05/2020    CBG (last 3)  Recent Labs    08/05/20 0303 08/05/20 0740 08/05/20 1118  GLUCAP 120* 130* 159*      GFR: Estimated Creatinine Clearance: 80.1 mL/min (by C-G formula based on SCr of 0.85 mg/dL).  Coagulation Profile: No results for input(s): INR, PROTIME in the last 168 hours.  No results found for this or any previous visit (from the past 240 hour(s)).  Radiology Studies: No results found.      Scheduled Meds:  amiodarone  200 mg Per Tube Daily   buPROPion  75 mg Per Tube BID   chlorhexidine gluconate (MEDLINE KIT)  15 mL Mouth Rinse BID   Chlorhexidine Gluconate Cloth  6 each Topical Q0600   [START ON 08/08/2020] darbepoetin (ARANESP) injection - DIALYSIS  150 mcg Intravenous Q Wed-HD   docusate  100 mg Per Tube BID   feeding supplement (PROSource TF)  45 mL Per Tube TID   guaiFENesin  5 mL Per Tube BID   heparin injection (subcutaneous)  5,000 Units Subcutaneous Q8H   insulin aspart  2 Units Subcutaneous Q4H   insulin glargine  10  Units Subcutaneous Daily   mouth rinse  15 mL Mouth Rinse 10 times per day   midodrine  10 mg Per Tube TID   multivitamin  1 tablet Per Tube QHS   pantoprazole sodium  40 mg Per Tube QHS   polyethylene glycol  17 g Per Tube Daily   sertraline  100 mg Per Tube Daily   sodium chloride flush  10-40 mL Intracatheter Q12H   Continuous Infusions:  sodium chloride Stopped (07/26/20 0824)   sodium chloride 250 mL (07/31/20 1428)   sodium chloride     sodium chloride     sodium chloride     [START ON 08/06/2020]  ceFAZolin (ANCEF) IV     feeding supplement (NEPRO CARB STEADY) 1,000 mL (08/05/20 1200)     LOS: 25 days     Cordelia Poche, MD [facility] 08/05/2020, 12:40 PM  If 7PM-7AM, please contact night-coverage [email or web address]

## 2020-08-06 DIAGNOSIS — Z9911 Dependence on respirator [ventilator] status: Secondary | ICD-10-CM | POA: Diagnosis not present

## 2020-08-06 DIAGNOSIS — A419 Sepsis, unspecified organism: Secondary | ICD-10-CM | POA: Diagnosis not present

## 2020-08-06 DIAGNOSIS — J9601 Acute respiratory failure with hypoxia: Secondary | ICD-10-CM | POA: Diagnosis not present

## 2020-08-06 DIAGNOSIS — N179 Acute kidney failure, unspecified: Secondary | ICD-10-CM | POA: Diagnosis not present

## 2020-08-06 LAB — GLUCOSE, CAPILLARY
Glucose-Capillary: 123 mg/dL — ABNORMAL HIGH (ref 70–99)
Glucose-Capillary: 113 mg/dL — ABNORMAL HIGH (ref 70–99)
Glucose-Capillary: 130 mg/dL — ABNORMAL HIGH (ref 70–99)
Glucose-Capillary: 140 mg/dL — ABNORMAL HIGH (ref 70–99)
Glucose-Capillary: 177 mg/dL — ABNORMAL HIGH (ref 70–99)
Glucose-Capillary: 141 mg/dL — ABNORMAL HIGH (ref 70–99)

## 2020-08-06 LAB — CBC
HCT: 25.5 % — ABNORMAL LOW (ref 39.0–52.0)
Hemoglobin: 8.3 g/dL — ABNORMAL LOW (ref 13.0–17.0)
MCH: 26.5 pg (ref 26.0–34.0)
MCHC: 32.5 g/dL (ref 30.0–36.0)
MCV: 81.5 fL (ref 80.0–100.0)
Platelets: UNDETERMINED 10*3/uL (ref 150–400)
RBC: 3.13 MIL/uL — ABNORMAL LOW (ref 4.22–5.81)
RDW: 26.3 % — ABNORMAL HIGH (ref 11.5–15.5)
WBC: 16.6 10*3/uL — ABNORMAL HIGH (ref 4.0–10.5)
nRBC: 0.6 % — ABNORMAL HIGH (ref 0.0–0.2)

## 2020-08-06 LAB — RENAL FUNCTION PANEL
Albumin: 2.3 g/dL — ABNORMAL LOW (ref 3.5–5.0)
Anion gap: 13 (ref 5–15)
BUN: 103 mg/dL — ABNORMAL HIGH (ref 8–23)
CO2: 24 mmol/L (ref 22–32)
Calcium: 8.8 mg/dL — ABNORMAL LOW (ref 8.9–10.3)
Chloride: 93 mmol/L — ABNORMAL LOW (ref 98–111)
Creatinine, Ser: 1.17 mg/dL (ref 0.61–1.24)
GFR, Estimated: >60 mL/min (ref >60)
Glucose, Bld: 127 mg/dL — ABNORMAL HIGH (ref 70–99)
Phosphorus: 6.2 mg/dL — ABNORMAL HIGH (ref 2.5–4.6)
Potassium: 4.3 mmol/L (ref 3.5–5.1)
Sodium: 130 mmol/L — ABNORMAL LOW (ref 135–145)

## 2020-08-06 LAB — MAGNESIUM: Magnesium: 2.1 mg/dL (ref 1.7–2.4)

## 2020-08-06 MED ORDER — JUVEN PO PACK
1.0000 | PACK | Freq: Two times a day (BID) | ORAL | Status: DC
  Administered 2020-08-06 – 2020-08-21 (×32): 1
  Filled 2020-08-06 (×29): qty 1

## 2020-08-06 MED ORDER — HEPARIN SODIUM (PORCINE) 5000 UNIT/ML IJ SOLN
5000.0000 [IU] | Freq: Three times a day (TID) | INTRAMUSCULAR | Status: DC
  Administered 2020-08-08 – 2020-08-09 (×4): 5000 [IU] via SUBCUTANEOUS
  Filled 2020-08-06 (×3): qty 1

## 2020-08-06 NOTE — Progress Notes (Signed)
[facility] Progress Note     Assessment/ Plan:   AKI -baseline Cr 0.4. AKI due to ATN /shock. CRRT initiated on 7/6 for anasarca with ineffective diuresis and uremia.  CCM for rij temp line placement 51/67- 16 days old - CRRT 7/6 - 7/15, now off due to improved hemodynamics -Not a good long term candidate for HD due to poor functional status, vent dependency. Dr. Candiss Norse voiced this concern to both son and wife over the phone on 7/6 and they wished to proceed with RRT.  Appreciate palliative care's assistance.  Dr. Johnney Ou discussed concern for long term dialysis to wife and son 7/12 and again on 7/17.  The plan was continue iHD through this past week given he's out of shock phase but if no signs of recovery then reconvene.  He already had been living at [place] for several years prior to this admission.  Palliative care has been meeting with family daily and family not willing to stop dialysis at this point.     Had IHD early Sat AM- next treatment planned for Tuesday and then maintain on TTS schedule.  Consult has been placed to IR for a tunneled HD cath hopefully early this week   Hyperkalemia shifted and lokelma given PRN   CKD-MBD, hyperphosphatemia -phos had been ok with CRRT, then up, on nepro, off renvela now   Acute metabolic encephalopathy, likely related to sepsis and uremia:  CT head unrevealing, seems to be improving   Septic shock:  resolved -off pressors now -off abx as of 7/15   Chronic respiratory failure s/p trach -vent mgmt per ccm   Quadriplegia 2/2 compressive myelopathy -resides at [facility] -appreciate palliative care's assistance-  [facility] will not take him back if he is dialysis dependent   Microcytic anemia -transfuse for hgb <7. give iron load as severely iron deficient on 6/30 labs (t sat 5%) - last given blood on 7/19. on ESA   Dispo:  with comorbids not a good long term dialysis candidate.  Family wish to continue current care.   He is  technically tolerating IHD -  continuing HD this past week with old non tunneled cath.  Based on previous conversations I think family will want to continue with HD-  finding a place for him to go post hosp will be very difficult - likely req out of state facility-  that may be the only card we can play to get the family to realize this is not the best idea for him.  consult placed for IR to place a TDC as that is where we are going and would hate for him to get septic again with an old temp cath  Subjective:   No changes overnight. Nods yes to everything.   Objective:   BP 132/82 (BP Location: Left Leg)   Pulse 64   Temp (!) 97.4 F (36.3 C) (Axillary)   Resp (!) 30   Ht 5' 6"  (1.676 m)   Wt 80.1 kg   SpO2 100%   BMI 28.50 kg/m   Intake/Output Summary (Last 24 hours) at 08/06/2020 0848 Last data filed at 08/06/2020 0800 Gross per 24 hour  Intake 1130 ml  Output 270 ml  Net 860 ml   Weight change: 0.8 kg  Physical Exam: IWL:[id number] ill appearing-  eyes open-  seems to understand what I am saying -  temp cath in for 17 days CVS:s1s2, rrr Resp:trach, bl chest expansion, on full vent support EYC:XKGY Ext:  2+  diffuse edema but better Neuro: functional quadriplegic, opening eyes, looking around  Imaging: No results found.  Labs: BMET Recent Labs  Lab 07/31/20 0512 08/01/20 0440 08/02/20 0406 08/03/20 0527 08/04/20 0329 08/05/20 0352 08/06/20 0341  NA 135 132* 133* 136 134* 133* 130*  K 3.8 3.3* 3.8 3.0* 4.9 4.1 4.3  CL 96* 96* 95* 98 94* 97* 93*  CO2 24 22 22 26 25 24 24   GLUCOSE 145* 120* 120* 117* 139* 142* 127*  BUN 94* 131* 157* 72* 98* 74* 103*  CREATININE 0.87 1.26* 1.40* 0.88 1.12 0.85 1.17  CALCIUM 8.3* 8.3* 8.5* 8.4* 8.9 8.7* 8.8*  PHOS 6.3* 7.6* 8.5* 3.9 4.9* 4.7* 6.2*   CBC Recent Labs  Lab 08/03/20 0527 08/04/20 1408 08/05/20 0352 08/06/20 0341  WBC 21.4* 19.2* 18.5* 16.6*  HGB 7.9* 8.7* 8.1* 8.3*  HCT 23.6* 26.8* 24.9* 25.5*  MCV 77.4*  82.2 81.1 81.5  PLT 215 PLATELET CLUMPS NOTED ON SMEAR, UNABLE TO ESTIMATE PLATELET CLUMPS NOTED ON SMEAR, UNABLE TO ESTIMATE PLATELET CLUMPS NOTED ON SMEAR, UNABLE TO ESTIMATE    Medications:     amiodarone  200 mg Per Tube Daily   buPROPion  75 mg Per Tube BID   chlorhexidine gluconate (MEDLINE KIT)  15 mL Mouth Rinse BID   Chlorhexidine Gluconate Cloth  6 each Topical Q0600   [START ON 08/08/2020] darbepoetin (ARANESP) injection - DIALYSIS  150 mcg Intravenous Q Wed-HD   docusate  100 mg Per Tube BID   feeding supplement (PROSource TF)  45 mL Per Tube TID   guaiFENesin  5 mL Per Tube BID   heparin injection (subcutaneous)  5,000 Units Subcutaneous Q8H   insulin aspart  2 Units Subcutaneous Q4H   insulin glargine  10 Units Subcutaneous Daily   mouth rinse  15 mL Mouth Rinse 10 times per day   midodrine  10 mg Per Tube TID   multivitamin  1 tablet Per Tube QHS   pantoprazole sodium  40 mg Per Tube QHS   polyethylene glycol  17 g Per Tube Daily   sertraline  100 mg Per Tube Daily   sodium chloride flush  10-40 mL Intracatheter Q12H      [name], MD 08/06/2020, 8:48 AM

## 2020-08-06 NOTE — Progress Notes (Signed)
Elink assisted with tele-visit with family

## 2020-08-06 NOTE — Progress Notes (Signed)
PROGRESS NOTE    Luis Orr  WCB:[id number] DOB: 1950-02-17 DOA: 06/25/2020 PCP: Townsend Roger, MD   Brief Narrative: Luis Orr is a 70 y.o. 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 Patient presented secondary to altered mental status and hypotension with recurrent pneumonia requiring vasopressor support and ICU admission.   ICU significant events: 6/29 admitted to ICU for septic shock on levo to maintain MAP>65, continued on vent support 6/29 Blood Cx>> staph species in 1 of 4 cultures drawn>> suspect contaminant 6/30 Off pressors 7/2 ID Consult for MDR acinetobacter in trach asp, Meropenem changed to unasyn 7/2 PRBC transfusion, iron replacement 7/5 Pressors added again. Central line placed. Drainage from gastrostomy tube >> CT Abd without fluid collection around gastrostomy tube but with diffuse body wall edema. Echo EF 50-55%, mild LVH 7/6 Renal Replacement Therapy ; Code status changed to partial Code (DNR) 7/7 Transfuse 1U PRBC 7/9 add solu cortef 7/10 off pressors, weaning stress steroids 7/11 Pressors added back on again 7/12 ID consult due to rising WBC, hypothermia, hypotension concern for worsening infection. Vancomycin added. Repeat blood cx collected. 7/15 CRRT stopped 7/17 Restarted on low dose levo. No evidence of new infection. Held off on antibiotics. Nephrology contacted family and decision was made to continue iHD this week to allow additional time for renal recovery. Patient is not a long term iHD candidate 7/18 plans for iHD, tolerated with 2L removed 719 slight drop in hgb to 6.9 will transfuse 1 unit PRBC   Assessment & Plan:   Active Problems:   Septic shock (HCC)   Pressure injury of skin   Acute respiratory failure with hypoxia (HCC)   AKI (acute kidney injury) ([place])   Hypotension   Acute on chronic respiratory failure with hypoxia and hypercapnia Secondary to  pneumonia/volume overload. Now resolved and back to baseline. Currently on ventilator support per PCCM.  Ventilator associated pneumonia Present on admission. Sputum culture significant for acinetobacter baumannii. Patient was treated with Unasyn x14 days. Completed course.  Leukocytosis Secondary to above infection and sepsis. Significantly improved; worsened slightly but now seems to be improving.  Volume overload Secondary to renal failure with anuria. Patient has improved with CRRT and now iHD. Still with anasarca.  AKI with oliguria Now appears patient is anuric. No documented urine output from last 24 hours. Nephrology is on board and was initially managing with CRRT and have transitioned to intermittent HD and tolerating. Issues with HD are that patient cannot go back to his SNF. Palliative care has been consulted and are following -Nephrology recommendations: Continuing HD, planning for [facility]  Septic shock Present on admission. Secondary to pneumonia. Patient required vasopressor support which is now weaned off. Resolved. Initial blood culture significant for likely contaminant. Repeat blood cultures negative.  Acute metabolic encephalopathy Secondary to acute illness. CT head negative for acute process. Appears improved and likely back at baseline.  Diabetes mellitus, type 2 Insulin needs have decreased. 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 Improving.  Quadriplegia Secondary to compressive myelopathy. Patient resides at [facility] but per chart review, [facility] will not accept him back if he is HD dependent.  Microcytic anemia Acute anemia Patient required 4 units of PRBC to date. Thought to be secondary to a combination of acute illness, chronic disease and iron deficiency. IV  iron given. Stable.  Severe  malnutrition -Continue tube feeds  Paroxysmal atrial fibrillation -Continue amiodarone  Pressure injury Medial/posterior sacrum present on admission. Right/posterior/lateral ankle unknown if present on admission. Left ear, not present on admission.   DVT prophylaxis: Heparin Code Status:   Code Status: Partial Code Family Communication: None at bedside Disposition Plan: Discharge complicated. Patient is from [place] [facility] and cannot return secondary to new HD requirement. Consideration for admission to [facility]. Otherwise, patient and family will need to consider out-of-state options.   Consultants:  PCCM Nephrology Infectious disease Palliative care medicine  Procedures:    Antimicrobials: Unasyn IV Vancomycin Meropenem Cefepime   Subjective: Non-verbal. But shakes his head when asking if he is doing okay. Per nursing, patient is not feeling well.  Objective: Vitals:   08/06/20 1200 08/06/20 1216 08/06/20 1300 08/06/20 1302  BP: 135/85  137/81   Pulse:  61  60  Resp:  (!) 30  (!) 30  Temp: 97.9 F (36.6 C)     TempSrc: Axillary     SpO2:  100%  100%  Weight:      Height:        Intake/Output Summary (Last 24 hours) at 08/06/2020 1325 Last data filed at 08/06/2020 1300 Gross per 24 hour  Intake 1290 ml  Output 145 ml  Net 1145 ml    Filed Weights   08/04/20 0500 08/05/20 0500 08/06/20 0449  Weight: 79.9 kg 79.3 kg 80.1 kg    Examination:  General exam: Appears calm and comfortable and in no acute distress. Not able to converse Respiratory: Rhonchi in left lung fields. Respiratory effort normal with no intercostal retractions or use of accessory muscles Cardiovascular: S1 & S2 heard, RRR. No murmurs, rubs, gallops or clicks. Bilateral upper/lower extemity edema Gastrointestinal: Abdomen is nondistended, soft and nontender. No masses felt. Normal bowel sounds heard Neurologic: No focal neurological deficits Musculoskeletal: No calf tenderness Skin:  No cyanosis. No new rashes Psychiatry: Alert. Flat affect   Data Reviewed: I have personally reviewed following labs and imaging studies  CBC Lab Results  Component Value Date   WBC 16.6 (H) 08/06/2020   RBC 3.13 (L) 08/06/2020   HGB 8.3 (L) 08/06/2020   HCT 25.5 (L) 08/06/2020   MCV 81.5 08/06/2020   MCH 26.5 08/06/2020   PLT PLATELET CLUMPS NOTED ON SMEAR, UNABLE TO ESTIMATE 08/06/2020   MCHC 32.5 08/06/2020   RDW 26.3 (H) 08/06/2020   LYMPHSABS 1.3 07/24/2020   MONOABS 1.7 (H) 07/24/2020   EOSABS 0.0 07/24/2020   BASOSABS 0.1 19/41/7408     Last metabolic panel Lab Results  Component Value Date   NA 130 (L) 08/06/2020   K 4.3 08/06/2020   CL 93 (L) 08/06/2020   CO2 24 08/06/2020   BUN 103 (H) 08/06/2020   CREATININE 1.17 08/06/2020   GLUCOSE 127 (H) 08/06/2020   GFRNONAA >60 08/06/2020   GFRAA NOT CALCULATED 10/11/2019   CALCIUM 8.8 (L) 08/06/2020   PHOS 6.2 (H) 08/06/2020   PROT 6.2 (L) 08/01/2020   ALBUMIN 2.3 (L) 08/06/2020   BILITOT 1.0 08/01/2020   ALKPHOS 254 (H) 08/01/2020   AST 60 (H) 08/01/2020   ALT 69 (H) 08/01/2020   ANIONGAP 13 08/06/2020    CBG (last 3)  Recent Labs    08/06/20 0348 08/06/20 0758 08/06/20 1135  GLUCAP 123* 113* 130*      GFR: Estimated Creatinine Clearance: 58.4 mL/min (by C-G formula based on SCr of 1.17 mg/dL).  Coagulation Profile:  No results for input(s): INR, PROTIME in the last 168 hours.  No results found for this or any previous visit (from the past 240 hour(s)).       Radiology Studies: No results found.      Scheduled Meds:  amiodarone  200 mg Per Tube Daily   buPROPion  75 mg Per Tube BID   chlorhexidine gluconate (MEDLINE KIT)  15 mL Mouth Rinse BID   Chlorhexidine Gluconate Cloth  6 each Topical Q0600   [START ON 08/08/2020] darbepoetin (ARANESP) injection - DIALYSIS  150 mcg Intravenous Q Wed-HD   docusate  100 mg Per Tube BID   feeding supplement (PROSource TF)  45 mL Per Tube TID    guaiFENesin  5 mL Per Tube BID   [START ON 08/08/2020] heparin injection (subcutaneous)  5,000 Units Subcutaneous Q8H   insulin aspart  2 Units Subcutaneous Q4H   insulin glargine  10 Units Subcutaneous Daily   mouth rinse  15 mL Mouth Rinse 10 times per day   midodrine  10 mg Per Tube TID   multivitamin  1 tablet Per Tube QHS   pantoprazole sodium  40 mg Per Tube QHS   polyethylene glycol  17 g Per Tube Daily   sertraline  100 mg Per Tube Daily   sodium chloride flush  10-40 mL Intracatheter Q12H   Continuous Infusions:  sodium chloride Stopped (07/26/20 0824)   sodium chloride 250 mL (07/31/20 1428)   sodium chloride     sodium chloride     [START ON 08/07/2020]  ceFAZolin (ANCEF) IV     feeding supplement (NEPRO CARB STEADY) 1,000 mL (08/06/20 1017)     LOS: 26 days     Cordelia Poche, MD [facility] 08/06/2020, 1:25 PM  If 7PM-7AM, please contact night-coverage [email or web address]

## 2020-08-06 NOTE — Progress Notes (Addendum)
NAME:  MILDRED STROSNIDER, MRN:  [id number], DOB:  08/23/1950, LOS: 13 ADMISSION DATE:  06/23/2020, CONSULTATION DATE:  07/02/2020 REFERRING MD:  Dr Francia Greaves, EDP CHIEF COMPLAINT:  septic shock    History of Present Illness:  70 yo male from [facility] with hx of C3-C5 compressive myelopathy with functional quadriplegia and chronic trach/vent presented to ED with altered mental status and hypotension with recurrent HCAP.  Has multiple recent admissions for sepsis secondary to various infections including UTIs, pneumonias and bacteremia in setting of MDR Klebsiella, MRSA and pseudomonas. Most recent admission September 2021 in setting of sepsis due to MDR Klebsiella with right ureteral calculus s/p double J ureteral stent placement and treated with meropenem x 10days.  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 Central line placed. Drainage from gastrostomy tube >> CT Abd without fluid collection around gastrostomy tube but with diffuse body wall edema. Echo EF 50-55%, mild LVH  7/6 Renal Replacement Therapy ; Code status changed to partial Code (DNR) 7/7 Transfuse 1U PRBC 7/9 add solu cortef 7/10 off pressors, weaning stress steroids  7/11 Pressors added back on again 7/12 ID consult due to rising WBC, hypothermia, hypotension concern for worsening infection. Vancomycin added. Repeat blood cx collected. 7/15 CRRT stopped   7/17 Restarted on low dose levo. No evidence of new infection. Held off on antibiotics. Nephrology contacted family and decision was made to continue iHD this week to allow additional time for renal recovery. Patient is not a long term iHD candidate  7/18 plans for iHD, tolerated with 2L removed  7/19 slight drop in hgb to 6.9 will transfuse 1 unit PRBC  7/20 To [facility], PCCM following for trach / vent needs  Interim History / Subjective:  Vent - 30% / PEEP 5  Afebrile / WBC 16.6 I/O - last HD 7/22 with 4.5 UF removed, +749m in last 24 hours   Objective   Blood pressure 132/82, pulse 64, temperature (!) 97.4 F (36.3 C), temperature source Axillary, resp. rate (!) 30, height '5\' 6"'$  (1.676 m), weight 80.1 kg, SpO2 100 %.    Vent Mode: PRVC FiO2 (%):  [30 %] 30 % Set Rate:  [30 bmp] 30 bmp Vt Set:  [380 mL] 380 mL PEEP:  [5 cmH20] 5 cmH20 Plateau Pressure:  [23 cmH20-25 cmH20] 23 cmH20   Intake/Output Summary (Last 24 hours) at 08/06/2020 0906 Last data filed at 08/06/2020 0800 Gross per 24 hour  Intake 1085 ml  Output 270 ml  Net 815 ml   Filed Weights   08/04/20 0500 08/05/20 0500 08/06/20 0449  Weight: 79.9 kg 79.3 kg 80.1 kg    Physical Exam: General:  chronically ill appearing adult male lying in bed on vent in NAD HEENT: MM pink/moist, anicteric, #6 proximal XLT trach midline with few thin clear/white secretions Neuro: eyes closed, opens to voice, grinding teeth CV: s1s2 RRR, no m/r/g PULM:  non-labored on vent, coarse rhonchi bilaterally GI:  soft, bsx4 active  Extremities: warm/dry, generalized edema, extremity changes consistent with chronic bed bound status    Resolved Hospital Problem list   Hyperkalemia Tachycardia Hypotension Thrombocytosis  AKI Septic shock 2nd to HCAP and UTI all present on admission -Acinetobacter in sputum culture from 07/12/20. Completed 14 days of Unasyn for -Of pressors on 4/14. Restarted  levo 7/17.  Assessment & Plan:   Acute on  chronic hypoxic and hypercarbic respiratory failure secondary pna and volume overload Tracheostomy and ventilator dependent status. 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 -per TR[facility]   Transaminitis, likely 2/2 shock liver - Improving  Differential includes viral hepatitis, drug-induced, hepatic ischemia from shock, adrenal insufficiency. Previous CT on 7/5 showed no focal liver abnormality but noted possible sludge or stone in gallbladder without intra or extra-hepatic biliary dilation.  -per TR[facility]   AKI  Hyperkalemia Hyponatremia Anasarca Baseline creatinine 0.4, AKI felt secondary to ATN in the setting of sepsis. Nephrology contacted family and decision was made to continue iHD this week to allow additional time for renal recovery. Patient is not a long term iHD candidate.  -planned for tunneled HD cath on 7/26 per IR  -continue CRRT per Nephrology  -per TR[facility]   Anemia of critical illness, chronic disease and iron deficiency:  S/p 1U PRBC transfusion on 7/02 and 7/07 and 7/15; holding iron supplementation in setting of infection -per TRH  Severe protein calorie malnutrition (POA) -continue TF per TRH  Paroxysmal atrial fibrillation. -per TRH  Sacral decubitus ulcer stage IV (POA) -per TRH  -wound prevention measures  Acute metabolic encephalopathy  Hypoactive delirium Depression CT head 7/15 - neg -per TRH   Goals of Care DNR in event of cardiac arrest -per [facility] -Palliative care following    Best Practice   Diet/type: tubefeeds DVT prophylaxis: prophylactic heparin  GI prophylaxis: PPI Lines: Dialysis Catheter and yes and it is still needed Foley:  Yes, and it is still needed. Chronic foley due to quadriplegia. Exchanged  7/1 Code Status:  limited, no CPR in the event of arrest.  Disposition: likely back to [facility] once he is off CRRT and tolerating iHD  PCCM will continue to follow for trach / vent needs 1-2x per week.  Please call if new concerns arise.   Signature:    Noe Gens, MSN, APRN, NP-C, AGACNP-BC [facility] 08/06/2020, 9:26 AM   Please see [email or web address] for pager details.   From 7A-7P if no response, please call [phone] After hours, please call ELink [phone]  Attending:   Subjective: No acute events Receiving hemodialysis  Objective: Vitals:   08/06/20 1200 08/06/20 1216 08/06/20 1300 08/06/20 1302  BP: 135/85  137/81   Pulse:  61  60  Resp:  (!) 30  (!) 30  Temp: 97.9 F (36.6 C)     TempSrc: Axillary     SpO2:  100%  100%  Weight:      Height:       Vent Mode: PRVC FiO2 (%):  [30 %] 30 % Set Rate:  [30 bmp] 30 bmp Vt Set:  [380 mL] 380 mL PEEP:  [5 cmH20] 5 cmH20 Plateau Pressure:  [23 cmH20-25 cmH20] 25 cmH20  Intake/Output Summary (Last 24 hours) at 08/06/2020 1318  Last data filed at 08/06/2020 1300 Gross per 24 hour  Intake 1290 ml  Output 145 ml  Net 1145 ml   General:  In bed on vent HENT: NCAT tracheostomy in place PULM: CTA B, vent supported breathing CV: RRR, no mgr GI: BS+, soft, nontender MSK: normal bulk and tone Neuro: awake, doesn't follow commands  CBC    Component Value Date/Time   WBC 16.6 (H) 08/06/2020 0341   RBC 3.13 (L) 08/06/2020 0341   HGB 8.3 (L) 08/06/2020 0341   HCT 25.5 (L) 08/06/2020 0341   PLT PLATELET CLUMPS NOTED ON SMEAR, UNABLE TO ESTIMATE 08/06/2020 0341   MCV 81.5 08/06/2020 0341   MCH 26.5 08/06/2020 0341   MCHC 32.5 08/06/2020 0341   RDW 26.3 (H) 08/06/2020 0341   LYMPHSABS 1.3 07/24/2020 1009   MONOABS 1.7 (H) 07/24/2020 1009   EOSABS 0.0 07/24/2020 1009   BASOSABS 0.1 07/24/2020 1009    BMET    Component Value Date/Time   NA 130 (L) 08/06/2020 0341   K 4.3 08/06/2020 0341    CL 93 (L) 08/06/2020 0341   CO2 24 08/06/2020 0341   GLUCOSE 127 (H) 08/06/2020 0341   BUN 103 (H) 08/06/2020 0341   CREATININE 1.17 08/06/2020 0341   CALCIUM 8.8 (L) 08/06/2020 0341   GFRNONAA >60 08/06/2020 0341   GFRAA NOT CALCULATED 10/11/2019 0226    CXR images   Impression/Plan: Acute respiratory failure with hypoxemia and hypercarbia Tracheostomy status Continue ventilator on current settings VAP prevention measures Trach care per routine  ESRD: access and further dialysis per renal  My cc time n/a minutes  Roselie Awkward, MD [place] [facility] Pager: [phone] Cell: [phone] After 7pm: [phone]

## 2020-08-06 NOTE — Progress Notes (Addendum)
Nutrition Follow-up  DOCUMENTATION CODES:   Not applicable  INTERVENTION:   TF via PEG: Nepro at 45 ml/h (1080 ml per day). Prosource TF 45 ml TID  Provides 2064 kcal, 120 gm protein, 785 ml free water daily.  Continue Renal MVI daily.  Add Juven BID via tube, each packet provides 80 calories, 8 grams of carbohydrate, 2.5  grams of protein (collagen), 7 grams of L-arginine and 7 grams of L-glutamine; supplement contains CaHMB, Vitamins C, E, B12 and Zinc to promote wound healing  NUTRITION DIAGNOSIS:   Increased nutrient needs related to wound healing as evidenced by estimated needs.  Ongoing   GOAL:   Patient will meet greater than or equal to 90% of their needs  Met with TF  MONITOR:   Vent status, Labs, Weight trends, TF tolerance, Skin, I & O's  REASON FOR ASSESSMENT:   Ventilator, Consult Enteral/tube feeding initiation and management  ASSESSMENT:   70 year old male who presented to the ED on 6/29 from [facility] with AMS and hypotension. 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 Pt admitted with septic shock, acute on chronic hypoxemic respiratory failure.  Discussed patient in ICU rounds and with RN today.  Patient needs tunneled cath for HD.  Tolerating TF well at goal rate. Next HD treatment planned for Tuesday; then will maintain on TTS schedule.  Receiving Nepro at 45 ml/h with Prosource TF 45 ml TID via PEG to provide 2064 kcal, 120 gm protein, 785 ml free water daily.   Patient remains intubated on ventilator support MV: 11.4 L/min Temp (24hrs), Avg:97.8 F (36.6 C), Min:97.4 F (36.3 C), Max:98.3 F (36.8 C)   Admission weight 90.2 kg Current weight 80.1 kg Lowest weight since admission 78.9 kg on 7/14  I/O -6.1 L since admission Colostomy output 195 ml x 24  hours  Labs reviewed. Na 130, Phos 6.2 CBG: 123-113-130  Medications reviewed and include Aranesp, Colace, novolog, Lantus, Rena-vit, Protonix, Miralax. Off Renvela.   Diet Order:   Diet Order             Diet NPO time specified  Diet effective midnight           Diet NPO time specified  Diet effective now                   EDUCATION NEEDS:   No education needs have been identified at this time  Skin:  Skin Assessment: Skin Integrity Issues: Skin Integrity Issues:: Stage I DTI: sacrum, L ear Stage I: R ankle Stage IV: sacrum  Last BM:  7/25 colostomy  Height:   Ht Readings from Last 1 Encounters:  07/31/20 5' 6"  (1.676 m)    Weight:   Wt Readings from Last 1 Encounters:  08/06/20 80.1 kg    Ideal Body Weight:  58 kg (adjusted for quadriplegia)  BMI:  Body mass index is 28.5 kg/m.  Estimated Nutritional Needs:   Kcal:  1900-2100  Protein:  110-120 gm  Fluid:  1 L + UOP    Lucas Mallow, RD, LDN, CNSC Please refer to Amion for contact information.

## 2020-08-07 ENCOUNTER — Inpatient Hospital Stay (HOSPITAL_COMMUNITY): Payer: 59

## 2020-08-07 ENCOUNTER — Encounter (HOSPITAL_COMMUNITY): Payer: Self-pay | Admitting: Interventional Radiology

## 2020-08-07 DIAGNOSIS — N186 End stage renal disease: Secondary | ICD-10-CM

## 2020-08-07 DIAGNOSIS — N179 Acute kidney failure, unspecified: Secondary | ICD-10-CM | POA: Diagnosis not present

## 2020-08-07 DIAGNOSIS — Z992 Dependence on renal dialysis: Secondary | ICD-10-CM

## 2020-08-07 DIAGNOSIS — A419 Sepsis, unspecified organism: Secondary | ICD-10-CM | POA: Diagnosis not present

## 2020-08-07 DIAGNOSIS — Z7189 Other specified counseling: Secondary | ICD-10-CM

## 2020-08-07 DIAGNOSIS — J9601 Acute respiratory failure with hypoxia: Secondary | ICD-10-CM | POA: Diagnosis not present

## 2020-08-07 DIAGNOSIS — R4182 Altered mental status, unspecified: Secondary | ICD-10-CM | POA: Diagnosis not present

## 2020-08-07 DIAGNOSIS — Z515 Encounter for palliative care: Secondary | ICD-10-CM | POA: Diagnosis not present

## 2020-08-07 DIAGNOSIS — L89304 Pressure ulcer of unspecified buttock, stage 4: Secondary | ICD-10-CM

## 2020-08-07 DIAGNOSIS — G8250 Quadriplegia, unspecified: Secondary | ICD-10-CM

## 2020-08-07 HISTORY — PX: IR US GUIDE VASC ACCESS RIGHT: IMG2390

## 2020-08-07 HISTORY — PX: IR FLUORO GUIDE CV LINE RIGHT: IMG2283

## 2020-08-07 LAB — RENAL FUNCTION PANEL
Albumin: 2.3 g/dL — ABNORMAL LOW (ref 3.5–5.0)
Anion gap: 15 (ref 5–15)
BUN: 139 mg/dL — ABNORMAL HIGH (ref 8–23)
CO2: 25 mmol/L (ref 22–32)
Calcium: 8.8 mg/dL — ABNORMAL LOW (ref 8.9–10.3)
Chloride: 94 mmol/L — ABNORMAL LOW (ref 98–111)
Creatinine, Ser: 1.53 mg/dL — ABNORMAL HIGH (ref 0.61–1.24)
GFR, Estimated: 49 mL/min — ABNORMAL LOW (ref >60)
Glucose, Bld: 114 mg/dL — ABNORMAL HIGH (ref 70–99)
Phosphorus: 7.1 mg/dL — ABNORMAL HIGH (ref 2.5–4.6)
Potassium: 4.3 mmol/L (ref 3.5–5.1)
Sodium: 134 mmol/L — ABNORMAL LOW (ref 135–145)

## 2020-08-07 LAB — GLUCOSE, CAPILLARY
Glucose-Capillary: 103 mg/dL — ABNORMAL HIGH (ref 70–99)
Glucose-Capillary: 124 mg/dL — ABNORMAL HIGH (ref 70–99)
Glucose-Capillary: 109 mg/dL — ABNORMAL HIGH (ref 70–99)
Glucose-Capillary: 146 mg/dL — ABNORMAL HIGH (ref 70–99)
Glucose-Capillary: 201 mg/dL — ABNORMAL HIGH (ref 70–99)
Glucose-Capillary: 181 mg/dL — ABNORMAL HIGH (ref 70–99)

## 2020-08-07 LAB — MAGNESIUM: Magnesium: 2.3 mg/dL (ref 1.7–2.4)

## 2020-08-07 MED ORDER — FENTANYL CITRATE (PF) 100 MCG/2ML IJ SOLN
INTRAMUSCULAR | Status: AC | PRN
  Administered 2020-08-07: 25 ug via INTRAVENOUS

## 2020-08-07 MED ORDER — LIDOCAINE HCL 1 % IJ SOLN
INTRAMUSCULAR | Status: AC | PRN
  Administered 2020-08-07: 10 mL

## 2020-08-07 MED ORDER — LIDOCAINE-PRILOCAINE 2.5-2.5 % EX CREA: 1.0000 "application " | TOPICAL_CREAM | CUTANEOUS | Status: DC | PRN

## 2020-08-07 MED ORDER — FENTANYL CITRATE (PF) 100 MCG/2ML IJ SOLN
INTRAMUSCULAR | Status: AC
  Filled 2020-08-07: qty 2

## 2020-08-07 MED ORDER — CEFAZOLIN SODIUM-DEXTROSE 2-4 GM/100ML-% IV SOLN
INTRAVENOUS | Status: AC
  Filled 2020-08-07: qty 100

## 2020-08-07 MED ORDER — LIDOCAINE HCL 1 % IJ SOLN
INTRAMUSCULAR | Status: AC
  Filled 2020-08-07: qty 20

## 2020-08-07 MED ORDER — CEFAZOLIN SODIUM-DEXTROSE 2-4 GM/100ML-% IV SOLN
INTRAVENOUS | Status: AC | PRN
  Administered 2020-08-07: 2 g via INTRAVENOUS

## 2020-08-07 MED ORDER — MIDAZOLAM HCL 2 MG/2ML IJ SOLN
INTRAMUSCULAR | Status: AC | PRN
  Administered 2020-08-07: 1 mg via INTRAVENOUS

## 2020-08-07 MED ORDER — MIDAZOLAM HCL 2 MG/2ML IJ SOLN
INTRAMUSCULAR | Status: AC
  Filled 2020-08-07: qty 2

## 2020-08-07 MED ORDER — HEPARIN SODIUM (PORCINE) 1000 UNIT/ML IJ SOLN
INTRAMUSCULAR | Status: AC
  Administered 2020-08-07: 1000 [IU] via INTRAVENOUS_CENTRAL
  Filled 2020-08-07: qty 1

## 2020-08-07 NOTE — Progress Notes (Addendum)
Daily Progress Note   Patient Name: Luis Orr       Date: 08/07/2020 DOB: 1950/07/27  Age: 70 y.o. MRN#: [id number] Attending Physician: Mariel Aloe, MD Primary Care Physician: Townsend Roger, MD Admit Date: 06/14/2020  Reason for Consultation/Follow-up: Establishing goals of care  Subjective: Chart Reviewed. Discussed with RN and assessed patient. He is alert and in no acute distress.   Called patient's wife June to provide support and follow up on goals of care conversation. We discussed the plan for tunneled HD cath this morning. June confirms that she has spoken with the medical team to express her decision of continuing patient's current care, including HD. She is on her way to [facility] this morning to collect some of his belongings and then coming to visit at the bedside this afternoon. She states it is very important for patient to have the best chance of tolerating HD long-term, and she hopes he will be back from IR by then. June is well-supported by family and finds tele-visits very helpful.  Questions and concerns addressed. PMT will continue to support holistically.  Addendum 1230: Returned to bedside at wife's request to meet and discuss goals of care again with her sister Opal Sidles present and her 2 cousins on the phone. We were then joined by [facility] for discussion of disposition options and planning.   We discussed the difficult situation of Mr. Calvey being stable enough for discharge but not stable enough to survive without ventilator support or hemodialysis. Patient's family has been researching various facilities for him to be closer to home. It was again clarified that there are no facilities in [place] that can support both of these needs. Reviewed patient's inability to tolerate weaning from the vent despite trials. Reviewed [facility]'s inability to accept him back with his new requirement of dialysis and educated on the prognosis of weeks without dialysis support.  Counseled at length and strongly emphasized that continuing current interventions would keep him in his current condition at best. He is still at risk for recurrent infections, bed sores, and other complications.  Reviewed the option of comfort care in great detail as well as the option of transfer to residential hospice. Patient would no longer receive aggressive medical interventions such as the ventilator, dialysis, continuous vital signs, lab work, radiology testing, or medications not focused on comfort. All care would focus on how the patient is looking and feeling. This would include management of any symptoms that may cause discomfort. June declines this option and confirms her preference to continue with all life-prolonging interventions.   Counseled on the importance of considering quality of life and whether Mr. Bloyer would have wanted to prolong his life in a situation where he is bed-bound and so far away from his family. June shares that the patient would have wanted to prolong his life as much as possible, although it is difficult to consider how much visitation would be acceptable to him. Her family members will ultimately support her in any decision, although they would like to reconvene after giving her time to discuss with her children.  Length of Stay: 27 days  Vital Signs: BP 125/81   Pulse 66   Temp 97.8 F (36.6 C) (Oral)   Resp (!) 30   Ht '5\' 6"'$  (1.676 m)   Wt 81.9 kg   SpO2 100%   BMI 29.14 kg/m  SpO2: SpO2: 100 % O2 Device: O2 Device: Ventilator O2 Flow Rate: O2 Flow Rate (L/min): 50  L/min  Physical Exam: NAD, chronically ill appearing M, opens eyes spontaneously Remains on vent support via trach Normal respiratory effort Regular heart rate, irregular rhythm   Flowsheet Rows    Flowsheet Row Most Recent Value  Intake Tab   Referral Department Critical care  Unit at Time of Referral ICU  Palliative Care Primary Diagnosis Nephrology  Date Notified 07/17/20   Palliative Care Type New Palliative care  Reason for referral Clarify Goals of Care  Date of Admission 07/01/2020  Date first seen by Palliative Care 07/18/20  # of days Palliative referral response time 1 Day(s)  # of days IP prior to Palliative referral 6  Clinical Assessment   Psychosocial & Spiritual Assessment   Palliative Care Outcomes        Palliative Care Assessment & Plan  HPI: Palliative Care consult requested for goals of care discussion in this 70 y.o. male  with past medical history of functional quadriplegic d/t compressive C3-C5 myelopathy, trach and vent (2018), diabetes, hypertension, anemia, and anxiety. Patient resides at [facility] facility. He was admitted on 06/18/2020 via EMS to ED from [place] with concerns for hypotension and altered mental status. Per reports at baseline he is alert and oriented x4. Patient was receiving treatment for pneumonia x1 mos. Patient started on IV antibiotics for sepsis in the setting of UTI. Requiring full ventilator support. 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   Code Status: Limited code  Goals of Care/Recommendations: Patient's wife has made the decision for continued dialysis and life-prolonging measures, understanding that this narrows her options to out of state facilities Psychosocial and emotional support provided PMT will continue to provide ongoing support *Addendum: Wife confirms her strong preference for aggressive care, but also requests more time to reflect on her choice. For now, she agrees to move forward with planning/determining whether a bed is available at [facility] in [place]. She requests to meet again via conference call on Friday 7/29 at 11am to give a final decision.  Prognosis: Guarded   Discharge Planning: To Be Determined  Thank you for allowing the Palliative Medicine Team to assist in the care of this patient.  Time: 25 minutes Addendum/additional time: 70 minutes Prolonged  time: Yes Total time: 95 minutes  Greater than 50% of this time was spent counseling and coordinating care related to the above assessment and plan.  Dorthy Cooler, [facility] Palliative Medicine Team Team Phone # [phone]  Pager # [phone]

## 2020-08-07 NOTE — Progress Notes (Signed)
RT transported patient from 2M16 to IR and back with RN. No complications and vital signs stable. RT will continue to monitor.

## 2020-08-07 NOTE — TOC Progression Note (Signed)
Transition of Care [facility]) - Progression Note    Patient Details  Name: Luis Orr MRN: [id number] Date of Birth: 13-Feb-1950  Transition of Care [facility]) CM/SW Contact  Sharin Mons, RN Phone Number: 08/07/2020, 4:23 PM  Clinical Narrative:    NCM @ pt's beside with palliative liaison, pt's wife and sister in law. After lengthy discussion regarding pt's next level of care , Vent /SNF - HD ,wife concludes she wants aggressive care for pt but would like to time to digest if she agrees with pt d/c to an out of state  vent/SNF. NCM  shared [facility] in [place], [place].  Wife gave NCM the ok to learn of bed availiabilty. NCM reached out to  [facility]  [phone] , fax [phone])  in [place], [place] and spoke with  a Erline Levine in admission. Erline Levine shared with NCM they do have Vent /SNF- HD beds.  Per wife a determination will be made on Friday after speaking with children/family.  TOC team will continue to monitor and assist with needs....  Expected Discharge Plan: [place] Barriers to Discharge: Continued Medical Work up  Expected Discharge Plan and Services Expected Discharge Plan: [place] arrangements for the past 2 months: Post-Acute Facility                                       Social Determinants of Health (SDOH) Interventions    Readmission Risk Interventions No flowsheet data found.

## 2020-08-07 NOTE — TOC Progression Note (Signed)
Transition of Care [facility]) - Progression Note    Patient Details  Name: Luis Orr MRN: [id number] Date of Birth: 1950-01-15  Transition of Care [facility]) CM/SW Contact  Milinda Antis, [place] Phone Number: 08/07/2020, 10:49 AM  Clinical Narrative:     10:48-  CSW attempted to contact with patient's wife, June, after reading that the family would like for the patient to continue HD and possibly explore out of state options for LTC.  There was no answer.  CSW left a VM requesting a returned call.    Expected Discharge Plan:  (Vent. SNF/ HD) Barriers to Discharge: Continued Medical Work up  Expected Discharge Plan and Services Expected Discharge Plan:  (Vent. SNF/ HD)       Living arrangements for the past 2 months: Post-Acute Facility                                       Social Determinants of Health (SDOH) Interventions    Readmission Risk Interventions No flowsheet data found.

## 2020-08-07 NOTE — Procedures (Signed)
Vascular and Interventional Radiology Procedure Note  Patient: Luis Orr DOB: Aug 18, 1950 Medical Record Number: [id number] Note Date/Time: 08/07/20 10:52 AM   Performing Physician: Michaelle Birks, MD Assistant(s): None  Diagnosis: ESRD requiring Hemodialysis  Procedure: TUNNELED HEMODIALYSIS CATHETER PLACEMENT  Anesthesia: Conscious Sedation Complications: None Estimated Blood Loss: Minimal Specimens:  None  Findings:  Successful placement of right-sided, 23 cm (tip-to-cuff), tunneled hemodialysis catheter with the tip of the catheter in the proximal right atrium.  Plan: Catheter ready for use.  See detailed procedure note with images in PACS. The patient tolerated the procedure well without incident or complication and was returned to ICU in stable condition.    Michaelle Birks, MD Vascular and Interventional Radiology [facility] Radiology   Clinic: [phone]

## 2020-08-07 NOTE — Progress Notes (Signed)
PROGRESS NOTE    Luis Orr  YKZ:[id number] DOB: 1950/05/03 DOA: 06/27/2020 PCP: Townsend Roger, MD   Brief Narrative: Luis Orr is a 70 y.o. 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 Patient presented secondary to altered mental status and hypotension with recurrent pneumonia requiring vasopressor support and ICU admission.   ICU significant events: 6/29 admitted to ICU for septic shock on levo to maintain MAP>65, continued on vent support 6/29 Blood Cx>> staph species in 1 of 4 cultures drawn>> suspect contaminant 6/30 Off pressors 7/2 ID Consult for MDR acinetobacter in trach asp, Meropenem changed to unasyn 7/2 PRBC transfusion, iron replacement 7/5 Pressors added again. Central line placed. Drainage from gastrostomy tube >> CT Abd without fluid collection around gastrostomy tube but with diffuse body wall edema. Echo EF 50-55%, mild LVH 7/6 Renal Replacement Therapy ; Code status changed to partial Code (DNR) 7/7 Transfuse 1U PRBC 7/9 add solu cortef 7/10 off pressors, weaning stress steroids 7/11 Pressors added back on again 7/12 ID consult due to rising WBC, hypothermia, hypotension concern for worsening infection. Vancomycin added. Repeat blood cx collected. 7/15 CRRT stopped 7/17 Restarted on low dose levo. No evidence of new infection. Held off on antibiotics. Nephrology contacted family and decision was made to continue iHD this week to allow additional time for renal recovery. Patient is not a long term iHD candidate 7/18 plans for iHD, tolerated with 2L removed 719 slight drop in hgb to 6.9 will transfuse 1 unit PRBC 7/22>7/26: Tolerating HD. TOC consulted for placement. Will likely need out of state placement.    Assessment & Plan:   Active Problems:   Septic shock (HCC)   Pressure injury of skin   Acute respiratory failure with hypoxia (HCC)   AKI (acute kidney injury) ([place])    Hypotension   Goals of care, counseling/discussion   Quadriplegia (HCC)   Chronic kidney disease requiring chronic dialysis ([place])   Acute on chronic respiratory failure with hypoxia and hypercapnia Secondary to pneumonia/volume overload. Now resolved and back to baseline. Currently on ventilator support per PCCM and is baseline.  Ventilator associated pneumonia Present on admission. Sputum culture significant for acinetobacter baumannii. Patient was treated with Unasyn x14 days. Completed course.  Leukocytosis Secondary to above infection and sepsis. Significantly improved; worsened slightly but now seems to be improving.  Volume overload Secondary to renal failure with anuria. Patient has improved with CRRT and now iHD. Still with anasarca. Patient will continue hemodialysis  AKI with oliguria Now appears patient is anuric. No documented urine output from last 24 hours. Nephrology is on board and was initially managing with CRRT and have transitioned to intermittent HD and tolerating. Issues with HD are that patient cannot go back to his SNF. Palliative care has been consulted and are following. McGuffey placed on 7/26 -Nephrology recommendations: Continuing HD  Septic shock Present on admission. Secondary to pneumonia. Patient required vasopressor support which is now weaned off. Resolved. Initial blood culture significant for likely contaminant. Repeat blood cultures negative.  Acute metabolic encephalopathy Secondary to acute illness. CT head negative for acute process. Appears improved and likely back at baseline.  Diabetes mellitus, type 2 Insulin needs have decreased. 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 Improving.  Quadriplegia Secondary to compressive myelopathy. Patient resides at [facility] but per  chart review, [facility] will  not accept him back if he is HD dependent.  Microcytic anemia Acute anemia Patient required 4 units of PRBC to date. Thought to be secondary to a combination of acute illness, chronic disease and iron deficiency. IV iron given. Stable.  Severe malnutrition -Continue tube feeds  Paroxysmal atrial fibrillation -Continue amiodarone  Pressure injury Medial/posterior sacrum present on admission. Right/posterior/lateral ankle unknown if present on admission. Left ear, not present on admission.   DVT prophylaxis: Heparin Code Status:   Code Status: Partial Code Family Communication: None at bedside Disposition Plan: Discharge complicated. Patient is from [place] [facility]F and cannot return secondary to new HD requirement. Consideration for admission to [facility]. Otherwise, patient and family will need to consider out-of-state options.   Consultants:  PCCM Nephrology Infectious disease Palliative care medicine  Procedures:    Antimicrobials: Unasyn IV Vancomycin Meropenem Cefepime   Subjective: Non-verbal  Objective: Vitals:   08/07/20 1539 08/07/20 1545 08/07/20 1600 08/07/20 1607  BP:  134/79 124/89   Pulse:  (!) 109 (!) 108 (!) 107  Resp:  (!) 30 (!) 30 (!) 30  Temp: 98.1 F (36.7 C)     TempSrc: Oral     SpO2:   100% 100%  Weight:      Height:        Intake/Output Summary (Last 24 hours) at 08/07/2020 1620 Last data filed at 08/07/2020 1600 Gross per 24 hour  Intake 1050 ml  Output 250 ml  Net 800 ml    Filed Weights   08/06/20 0449 08/07/20 0500 08/07/20 1345  Weight: 80.1 kg 81.9 kg 80.9 kg    Examination:  General exam: Appears calm and comfortable Respiratory system: Clear to auscultation. Respiratory effort normal. Cardiovascular system: S1 & S2 heard, RRR. No murmurs, rubs, gallops or clicks. Gastrointestinal system: Abdomen is nondistended, soft and nontender. No organomegaly or masses felt. Normal bowel sounds heard. Central nervous system:  Alert. Quadriplegia  Musculoskeletal: Bilateral upper/lower extremity edema. No calf tenderness Skin: No cyanosis. No rashes Psychiatry: Flat affect    Data Reviewed: I have personally reviewed following labs and imaging studies  CBC Lab Results  Component Value Date   WBC 16.6 (H) 08/06/2020   RBC 3.13 (L) 08/06/2020   HGB 8.3 (L) 08/06/2020   HCT 25.5 (L) 08/06/2020   MCV 81.5 08/06/2020   MCH 26.5 08/06/2020   PLT PLATELET CLUMPS NOTED ON SMEAR, UNABLE TO ESTIMATE 08/06/2020   MCHC 32.5 08/06/2020   RDW 26.3 (H) 08/06/2020   LYMPHSABS 1.3 07/24/2020   MONOABS 1.7 (H) 07/24/2020   EOSABS 0.0 07/24/2020   BASOSABS 0.1 31/51/7616     Last metabolic panel Lab Results  Component Value Date   NA 134 (L) 08/07/2020   K 4.3 08/07/2020   CL 94 (L) 08/07/2020   CO2 25 08/07/2020   BUN 139 (H) 08/07/2020   CREATININE 1.53 (H) 08/07/2020   GLUCOSE 114 (H) 08/07/2020   GFRNONAA 49 (L) 08/07/2020   GFRAA NOT CALCULATED 10/11/2019   CALCIUM 8.8 (L) 08/07/2020   PHOS 7.1 (H) 08/07/2020   PROT 6.2 (L) 08/01/2020   ALBUMIN 2.3 (L) 08/07/2020   BILITOT 1.0 08/01/2020   ALKPHOS 254 (H) 08/01/2020   AST 60 (H) 08/01/2020   ALT 69 (H) 08/01/2020   ANIONGAP 15 08/07/2020    CBG (last 3)  Recent Labs    08/07/20 0717 08/07/20 1123 08/07/20 1538  GLUCAP 124* 109* 146*      GFR: Estimated Creatinine  Clearance: 44.9 mL/min (A) (by C-G formula based on SCr of 1.53 mg/dL (H)).  Coagulation Profile: No results for input(s): INR, PROTIME in the last 168 hours.  No results found for this or any previous visit (from the past 240 hour(s)).       Radiology Studies: IR Fluoro Guide CV Line Right  Result Date: 08/07/2020 Vascular and Interventional Radiology Procedure Note Patient: Luis Orr DOB: May 03, 1950 Medical Record Number: [id number] Note Date/Time: 08/07/20 10:52 AM Performing Physician: Michaelle Birks, MD Assistant(s): None Diagnosis: ESRD requiring Hemodialysis  Procedure: TUNNELED HEMODIALYSIS CATHETER PLACEMENT Anesthesia: Conscious Sedation Complications: None Estimated Blood Loss: Minimal Specimens:  None Findings: Successful placement of right-sided, 23 cm (tip-to-cuff), tunneled hemodialysis catheter with the tip of the catheter in the proximal right atrium. Plan: Catheter ready for use. See detailed procedure note with images in PACS. The patient tolerated the procedure well without incident or complication and was returned to ICU in stable condition. Michaelle Birks, MD [facility] Radiology   IR Fluoro Guide CV Line Right  Result Date: 08/07/2020 Vascular and Interventional Radiology Procedure Note Patient: Luis Orr DOB: 28-Mar-1950 Medical Record Number: [id number] Note Date/Time: 08/07/20 10:52 AM Performing Physician: Michaelle Birks, MD Assistant(s): None Diagnosis: ESRD requiring Hemodialysis Procedure: TUNNELED HEMODIALYSIS CATHETER PLACEMENT Anesthesia: Conscious Sedation Complications: None Estimated Blood Loss: Minimal Specimens:  None Findings: Successful placement of right-sided, 23 cm (tip-to-cuff), tunneled hemodialysis catheter with the tip of the catheter in the proximal right atrium. Plan: Catheter ready for use. See detailed procedure note with images in PACS. The patient tolerated the procedure well without incident or complication and was returned to ICU in stable condition. Michaelle Birks, MD Vascular and Interventional Radiology Specialists [facility] Radiology   IR US Guide Vasc Access Right  Result Date: 08/07/2020 Vascular and Interventional Radiology Procedure Note Patient: Luis Orr DOB: [birth year] Medical Record Number: [id number] Note Date/Time: 08/07/20 10:52 AM Performing Physician: Michaelle Birks, MD Assistant(s): None Diagnosis: ESRD requiring Hemodialysis Procedure: TUNNELED HEMODIALYSIS CATHETER PLACEMENT Anesthesia: Conscious Sedation Complications: None Estimated Blood  Loss: Minimal Specimens:  None Findings: Successful placement of right-sided, 23 cm (tip-to-cuff), tunneled hemodialysis catheter with the tip of the catheter in the proximal right atrium. Plan: Catheter ready for use. See detailed procedure note with images in PACS. The patient tolerated the procedure well without incident or complication and was returned to ICU in stable condition. Michaelle Birks, MD Vascular [facility] Interventional Radiology Specialists [facility] Radiology        Scheduled Meds:  amiodarone  200 mg Per Tube Daily   buPROPion  75 mg Per Tube BID   chlorhexidine gluconate (MEDLINE KIT)  15 mL Mouth Rinse BID   Chlorhexidine Gluconate Cloth  6 each Topical Q0600   [START ON 08/08/2020] darbepoetin (ARANESP) injection - DIALYSIS  150 mcg Intravenous Q Wed-HD   docusate  100 mg Per Tube BID   feeding supplement (PROSource TF)  45 mL Per Tube TID   guaiFENesin  5 mL Per Tube BID   [START ON 08/08/2020] heparin injection (subcutaneous)  5,000 Units Subcutaneous Q8H   heparin sodium (porcine)       insulin aspart  2 Units Subcutaneous Q4H   insulin glargine  10 Units Subcutaneous Daily   lidocaine       mouth rinse  15 mL Mouth Rinse 10 times per day   midodrine  10 mg Per Tube TID   multivitamin  1 tablet Per Tube QHS   nutrition supplement (JUVEN)  1 packet Per Tube BID BM   pantoprazole sodium  40 mg Per Tube QHS   polyethylene glycol  17 g Per Tube Daily   sertraline  100 mg Per Tube Daily   sodium chloride flush  10-40 mL Intracatheter Q12H   Continuous Infusions:  sodium chloride Stopped (07/26/20 0824)   sodium chloride 250 mL (07/31/20 1428)   sodium chloride     sodium chloride     feeding supplement (NEPRO CARB STEADY) 1,000 mL (08/07/20 1200)     LOS: 27 days     Cordelia Poche, MD [facility] 08/07/2020, 4:20 PM  If 7PM-7AM, please contact night-coverage [email or web address]

## 2020-08-07 NOTE — Progress Notes (Signed)
[facility] Progress Note     Assessment/ Plan:   AKI -baseline Cr 0.4. AKI due to ATN /shock. CRRT initiated on 7/6 for anasarca with ineffective diuresis and uremia.  CCM for rij temp line placement 33/79- 61 days old - CRRT 7/6 - 7/15, now off due to improved hemodynamics -Not a good long term candidate for HD due to poor functional status, vent dependency. Dr. Candiss Norse voiced this concern to both son and wife over the phone on 7/6 and they wished to proceed with RRT.  Appreciate palliative care's assistance.  Dr. Johnney Ou discussed concern for long term dialysis to wife and son 7/12 and again on 7/17.  The plan was continue iHD through this past week given he's out of shock phase but if no signs of recovery then reconvene.  He already had been living at [facility] for several years prior to this admission.  Palliative care has been meeting with family daily and family not willing to stop dialysis at this point.     Had IHD early Sat AM- next treatment planned for today and then maintain on TTS schedule.  Consult has been placed to IR for a tunneled HD cath hopefully today.   Hyperkalemia shifted and lokelma given PRN   CKD-MBD, hyperphosphatemia -phos had been ok with CRRT, then up, on nepro, off renvela now   Acute metabolic encephalopathy, likely related to sepsis and uremia:  CT head unrevealing, seems to be improving   Septic shock:  resolved -off pressors now -off abx as of 7/15   Chronic respiratory failure s/p trach -vent mgmt per ccm   Quadriplegia 2/2 compressive myelopathy -resides at [facility] PTA -appreciate palliative care's assistance-  [facility] will not take him back if he is dialysis dependent   Microcytic anemia -transfuse for hgb <7. give iron load as severely iron deficient on 6/30 labs (t sat 5%) - last given blood on 7/19. on ESA   Dispo:  with comorbids not a good long term dialysis candidate.  Family wish to continue current care.   He is technically  tolerating IHD -  continuing HD this past week with old non tunneled cath.  Based on previous conversations I think family will want to continue with HD-  finding a place for him to go post hosp will be very difficult - likely req out of state facility  Subjective:   No changes overnight. No fevers overnight.   Objective:   BP (!) 174/84   Pulse 87   Temp 97.8 F (36.6 C) (Oral)   Resp (!) 30   Ht 5' 6"  (1.676 m)   Wt 81.9 kg   SpO2 91%   BMI 29.14 kg/m   Intake/Output Summary (Last 24 hours) at 08/07/2020 0175 Last data filed at 08/07/2020 0400 Gross per 24 hour  Intake 1190 ml  Output 300 ml  Net 890 ml   Weight change: 1.8 kg  Physical Exam: [id number] ill appearing-  eyes open-  seems to understand what I am saying -  temp cath in for 17 days CVS:s1s2, rrr Resp:trach, bl chest expansion, on full vent support NTI:RWER Ext:  2+ diffuse edema but better Neuro: functional quadriplegic, opening eyes, looking around  Imaging: No results found.  Labs: BMET Recent Labs  Lab 08/01/20 0440 08/02/20 0406 08/03/20 0527 08/04/20 0329 08/05/20 0352 08/06/20 0341  NA 132* 133* 136 134* 133* 130*  K 3.3* 3.8 3.0* 4.9 4.1 4.3  CL 96* 95* 98 94* 97* 93*  CO2  22 22 26 25 24 24   GLUCOSE 120* 120* 117* 139* 142* 127*  BUN 131* 157* 72* 98* 74* 103*  CREATININE 1.26* 1.40* 0.88 1.12 0.85 1.17  CALCIUM 8.3* 8.5* 8.4* 8.9 8.7* 8.8*  PHOS 7.6* 8.5* 3.9 4.9* 4.7* 6.2*   CBC Recent Labs  Lab 08/03/20 0527 08/04/20 1408 08/05/20 0352 08/06/20 0341  WBC 21.4* 19.2* 18.5* 16.6*  HGB 7.9* 8.7* 8.1* 8.3*  HCT 23.6* 26.8* 24.9* 25.5*  MCV 77.4* 82.2 81.1 81.5  PLT 215 PLATELET CLUMPS NOTED ON SMEAR, UNABLE TO ESTIMATE PLATELET CLUMPS NOTED ON SMEAR, UNABLE TO ESTIMATE PLATELET CLUMPS NOTED ON SMEAR, UNABLE TO ESTIMATE    Medications:     amiodarone  200 mg Per Tube Daily   buPROPion  75 mg Per Tube BID   chlorhexidine gluconate (MEDLINE KIT)  15 mL Mouth Rinse BID    Chlorhexidine Gluconate Cloth  6 each Topical Q0600   [START ON 08/08/2020] darbepoetin (ARANESP) injection - DIALYSIS  150 mcg Intravenous Q Wed-HD   docusate  100 mg Per Tube BID   feeding supplement (PROSource TF)  45 mL Per Tube TID   guaiFENesin  5 mL Per Tube BID   [START ON 08/08/2020] heparin injection (subcutaneous)  5,000 Units Subcutaneous Q8H   insulin aspart  2 Units Subcutaneous Q4H   insulin glargine  10 Units Subcutaneous Daily   mouth rinse  15 mL Mouth Rinse 10 times per day   midodrine  10 mg Per Tube TID   multivitamin  1 tablet Per Tube QHS   nutrition supplement (JUVEN)  1 packet Per Tube BID BM   pantoprazole sodium  40 mg Per Tube QHS   polyethylene glycol  17 g Per Tube Daily   sertraline  100 mg Per Tube Daily   sodium chloride flush  10-40 mL Intracatheter Q12H      [name], MD 08/07/2020, 8:29 AM

## 2020-08-07 NOTE — Progress Notes (Signed)
Assisted tele visit to patient with family member.  Caiden Arteaga M, RN   

## 2020-08-08 DIAGNOSIS — N179 Acute kidney failure, unspecified: Secondary | ICD-10-CM | POA: Diagnosis not present

## 2020-08-08 DIAGNOSIS — I959 Hypotension, unspecified: Secondary | ICD-10-CM | POA: Diagnosis not present

## 2020-08-08 DIAGNOSIS — G8250 Quadriplegia, unspecified: Secondary | ICD-10-CM

## 2020-08-08 DIAGNOSIS — J9601 Acute respiratory failure with hypoxia: Secondary | ICD-10-CM | POA: Diagnosis not present

## 2020-08-08 LAB — MAGNESIUM: Magnesium: 2.1 mg/dL (ref 1.7–2.4)

## 2020-08-08 LAB — CBC
HCT: 27 % — ABNORMAL LOW (ref 39.0–52.0)
Hemoglobin: 8.6 g/dL — ABNORMAL LOW (ref 13.0–17.0)
MCH: 26.5 pg (ref 26.0–34.0)
MCHC: 31.9 g/dL (ref 30.0–36.0)
MCV: 83.3 fL (ref 80.0–100.0)
Platelets: 315 10*3/uL (ref 150–400)
RBC: 3.24 MIL/uL — ABNORMAL LOW (ref 4.22–5.81)
RDW: 23.9 % — ABNORMAL HIGH (ref 11.5–15.5)
WBC: 18.3 10*3/uL — ABNORMAL HIGH (ref 4.0–10.5)
nRBC: 0.4 % — ABNORMAL HIGH (ref 0.0–0.2)

## 2020-08-08 LAB — GLUCOSE, CAPILLARY
Glucose-Capillary: 127 mg/dL — ABNORMAL HIGH (ref 70–99)
Glucose-Capillary: 136 mg/dL — ABNORMAL HIGH (ref 70–99)
Glucose-Capillary: 151 mg/dL — ABNORMAL HIGH (ref 70–99)
Glucose-Capillary: 166 mg/dL — ABNORMAL HIGH (ref 70–99)
Glucose-Capillary: 167 mg/dL — ABNORMAL HIGH (ref 70–99)

## 2020-08-08 LAB — RENAL FUNCTION PANEL
Albumin: 2.3 g/dL — ABNORMAL LOW (ref 3.5–5.0)
Anion gap: 14 (ref 5–15)
BUN: 72 mg/dL — ABNORMAL HIGH (ref 8–23)
CO2: 22 mmol/L (ref 22–32)
Calcium: 8.6 mg/dL — ABNORMAL LOW (ref 8.9–10.3)
Chloride: 98 mmol/L (ref 98–111)
Creatinine, Ser: 1.01 mg/dL (ref 0.61–1.24)
GFR, Estimated: >60 mL/min (ref >60)
Glucose, Bld: 171 mg/dL — ABNORMAL HIGH (ref 70–99)
Phosphorus: 4.9 mg/dL — ABNORMAL HIGH (ref 2.5–4.6)
Potassium: 4 mmol/L (ref 3.5–5.1)
Sodium: 134 mmol/L — ABNORMAL LOW (ref 135–145)

## 2020-08-08 MED ORDER — WHITE PETROLATUM EX OINT
TOPICAL_OINTMENT | CUTANEOUS | Status: AC
  Filled 2020-08-08: qty 28.35

## 2020-08-08 NOTE — Progress Notes (Signed)
[facility] Progress Note     Assessment/ Plan:   AKI -baseline Cr 0.4. AKI due to ATN /shock. CRRT initiated on 7/6 for anasarca with ineffective diuresis and uremia.  CCM for rij temp line placement 72/92- 49 days old - CRRT 7/6 - 7/15, now off due to improved hemodynamics -Not a good long term candidate for HD due to poor functional status, vent dependency. Dr. Candiss Norse voiced this concern to both son and wife over the phone on 7/6 and they wished to proceed with RRT.  Appreciate palliative care's assistance.  Dr. Johnney Ou discussed concern for long term dialysis to wife and son 7/12 and again on 7/17.  The plan was continue iHD through this past week given he's out of shock phase but if no signs of recovery then reconvene.  He already had been living at [facility] for several years prior to this admission.  Palliative care has been meeting with family daily and family not willing to stop dialysis at this point.     Had iHD early Sat AM; last HD 7/26 UF net 3L tolerated. Next treatment planned for THur  on TTS schedule.  Appreciate VIR converting to  tunneled HD cath 7/26. 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 give iron load as severely iron deficient on 6/30 labs (t sat 5%) - last given blood on 7/19. on ESA   Dispo:  with comorbids not a good long term dialysis candidate.  Family wish to continue  current care.   He is technically tolerating IHD -  continuing HD this past week with old non tunneled cath.  Based on previous conversations I think family will want to continue with HD-  finding a place for him to go post hosp will be very difficult - likely req out of state facility  Subjective:   No changes overnight. TM 100.2 overnight.   Objective:   BP 111/67   Pulse 98   Temp 100.2 F (37.9 C) (Axillary)   Resp (!) 30   Ht 5' 6"  (1.676 m)   Wt 75.3 kg   SpO2 100%   BMI 26.79 kg/m   Intake/Output Summary (Last 24 hours) at 08/08/2020 1222 Last data filed at 08/08/2020 1000 Gross per 24 hour  Intake 1200 ml  Output 3200 ml  Net -2000 ml   Weight change: -1 kg  Physical Exam: [id number] ill appearing-  eyes open-  seems to understand what I am saying CVS:s1s2, rrr Resp:trached, bl chest expansion, on full vent support YBO:FBPZ Ext:  2+ diffuse edema but better Neuro: functional quadriplegic, opening eyes, looking around  Imaging: IR Fluoro Guide CV Line Right  Result Date: 08/07/2020 Vascular and Interventional Radiology Procedure Note Patient: MAKARI PORTMAN DOB: [birth year] Medical Record Number: [id number] Note Date/Time: 08/07/20 10:52 AM Performing Physician: Michaelle Birks, MD Assistant(s): None Diagnosis: ESRD requiring Hemodialysis Procedure: TUNNELED HEMODIALYSIS CATHETER PLACEMENT Anesthesia: Conscious Sedation Complications:  None Estimated Blood Loss: Minimal Specimens:  None Findings: Successful placement of right-sided, 23 cm (tip-to-cuff), tunneled hemodialysis catheter with the tip of the catheter in the proximal right atrium. Plan: Catheter ready for use. See detailed procedure note with images in PACS. The patient tolerated the procedure well without incident or complication and was returned to ICU in stable condition. Michaelle Birks, MD V[facility] and Interventional Radiology Specialists [facility] Radiology   IR Fluoro Guide CV Line Right  Result Date:  08/07/2020 Vascular and Interventional Radiology Procedure Note Patient: RAINER MOUNCE DOB: 1950/01/26 Medical Record Number: [id number] Note Date/Time: 08/07/20 10:52 AM Performing Physician: Michaelle Birks, MD Assistant(s): None Diagnosis: ESRD requiring Hemodialysis Procedure: TUNNELED HEMODIALYSIS CATHETER PLACEMENT Anesthesia: Conscious Sedation Complications: None Estimated Blood Loss: Minimal Specimens:  None Findings: Successful placement of right-sided, 23 cm (tip-to-cuff), tunneled hemodialysis catheter with the tip of the catheter in the proximal right atrium. Plan: Catheter ready for use. See detailed procedure note with images in PACS. The patient tolerated the procedure well without incident or complication and was returned to ICU in stable condition. Michaelle Birks, MD [facility] Interventional Radiology Specialists [facility] Radiology   IR US Guide Vasc Access Right  Result Date: 08/07/2020 Vascular and Interventional Radiology Procedure Note Patient: BELL CAI DOB: July 24, 1950 Medical Record Number: [id number] Note Date/Time: 08/07/20 10:52 AM Performing Physician: Michaelle Birks, MD Assistant(s): None Diagnosis: ESRD requiring Hemodialysis Procedure: TUNNELED HEMODIALYSIS CATHETER PLACEMENT Anesthesia: Conscious Sedation Complications: None Estimated Blood Loss: Minimal Specimens:  None Findings: Successful placement of right-sided, 23 cm (tip-to-cuff), tunneled hemodialysis catheter with the tip of the catheter in the proximal right atrium. Plan: Catheter ready for use. See detailed procedure note with images in PACS. The patient tolerated the procedure well without incident or complication and was returned to ICU in stable condition. Michaelle Birks, MD Vascular [facility] Interventional Radiology Specialists [facility] Radiology    Labs: [facility] Recent Labs  Lab 08/02/20 0406 08/03/20 0527 08/04/20 0329 08/05/20 0352 08/06/20 0341 08/07/20 0906 08/08/20 0220  NA 133* 136 134* 133* 130*  134* 134*  K 3.8 3.0* 4.9 4.1 4.3 4.3 4.0  CL 95* 98 94* 97* 93* 94* 98  CO2 22 26 25 24 24 25 22   GLUCOSE 120* 117* 139* 142* 127* 114* 171*  BUN 157* 72* 98* 74* 103* 139* 72*  CREATININE 1.40* 0.88 1.12 0.85 1.17 1.53* 1.01  CALCIUM 8.5* 8.4* 8.9 8.7* 8.8* 8.8* 8.6*  PHOS 8.5* 3.9 4.9* 4.7* 6.2* 7.1* 4.9*   CBC Recent Labs  Lab 08/04/20 1408 08/05/20 0352 08/06/20 0341 08/08/20 0431  WBC 19.2* 18.5* 16.6* 18.3*  HGB 8.7* 8.1* 8.3* 8.6*  HCT 26.8* 24.9* 25.5* 27.0*  MCV 82.2 81.1 81.5 83.3  PLT PLATELET CLUMPS NOTED ON SMEAR, UNABLE TO ESTIMATE PLATELET CLUMPS NOTED ON SMEAR, UNABLE TO ESTIMATE PLATELET CLUMPS NOTED ON SMEAR, UNABLE TO ESTIMATE 315    Medications:     amiodarone  200 mg Per Tube Daily   buPROPion  75 mg Per Tube BID   chlorhexidine gluconate (MEDLINE KIT)  15 mL Mouth Rinse BID   Chlorhexidine Gluconate Cloth  6 each Topical Q0600   darbepoetin (ARANESP) injection - DIALYSIS  150 mcg Intravenous Q Wed-HD   docusate  100 mg Per Tube BID   feeding supplement (PROSource TF)  45 mL Per Tube TID   guaiFENesin  5 mL Per Tube BID   heparin injection (subcutaneous)  5,000 Units Subcutaneous Q8H   insulin aspart  2 Units Subcutaneous Q4H  insulin glargine  10 Units Subcutaneous Daily   mouth rinse  15 mL Mouth Rinse 10 times per day   midodrine  10 mg Per Tube TID   multivitamin  1 tablet Per Tube QHS   nutrition supplement (JUVEN)  1 packet Per Tube BID BM   pantoprazole sodium  40 mg Per Tube QHS   polyethylene glycol  17 g Per Tube Daily   sertraline  100 mg Per Tube Daily   sodium chloride flush  10-40 mL Intracatheter Q12H      [name], MD 08/08/2020, 12:22 PM

## 2020-08-08 NOTE — Progress Notes (Signed)
Patient ID: Luis Orr, male   DOB: 10-Feb-1950, 70 y.o.   MRN: [id number]  PROGRESS NOTE    Luis Orr  YPP:[id number] DOB: 1950-10-23 DOA: 07/10/2020 PCP: Townsend Roger, MD   Brief Narrative:  70 y.o. male from [facility] with hx of C3-C5 compressive myelopathy with functional quadriplegia and chronic trach/vent presented secondary to altered mental status and hypotension with recurrent pneumonia requiring vasopressor support and ICU admission.  ICU significant events: 6/29 admitted to ICU for septic shock on levo to maintain MAP>65, continued on vent support 6/29 Blood Cx>> staph species in 1 of 4 cultures drawn>> suspect contaminant 6/30 Off pressors 7/2 ID Consult for MDR acinetobacter in trach asp, Meropenem changed to unasyn 7/2 PRBC transfusion, iron replacement 7/5 Pressors added again. Central line placed. Drainage from gastrostomy tube >> CT Abd without fluid collection around gastrostomy tube but with diffuse body wall edema. Echo EF 50-55%, mild LVH 7/6 Renal Replacement Therapy ; Code status changed to partial Code (DNR) 7/7 Transfuse 1U PRBC 7/9 add solu cortef 7/10 off pressors, weaning stress steroids 7/11 Pressors added back on again 7/12 ID consult due to rising WBC, hypothermia, hypotension concern for worsening infection. Vancomycin added. Repeat blood cx collected. 7/15 CRRT stopped 7/17 Restarted on low dose levo. No evidence of new infection. Held off on antibiotics. Nephrology contacted family and decision was made to continue iHD this week to allow additional time for renal recovery. Patient is not a long term iHD candidate 7/18 plans for iHD, tolerated with 2L removed 7/19 slight drop in hgb to 6.9 will transfuse 1 unit PRBC 7/22>7/26: Tolerating HD. TOC consulted for placement. Will likely need out of state placement.   He was subsequently transferred to [facility] service.  Assessment & Plan:   Acute on chronic hypoxic and hypercapnic respiratory  failure -Secondary to pneumonia/volume overload. Now resolved and back to baseline. Currently on ventilator support per PCCM and is baseline.  PCCM following intermittently  Ventilator associated pneumonia -Present on admission. Sputum culture significant for acinetobacter baumannii. -Patient was treated with Unasyn x14 days. Completed course.   Leukocytosis -Secondary to above infection and sepsis. -Monitor  Volume overload -Secondary to renal failure with anuria. Patient has improved with CRRT and now on intermittent hemodialysis.  Still with anasarca. Patient will continue hemodialysis   AKI with oliguria -Now appears patient is anuric.  -Nephrology following: Was initially on CRRT and currently on intermittent HD.  Issues with HD are that patient cannot go back to his SNF. Palliative care has been consulted and are following. [place] placed on 08/07/2020 -Patient is a poor candidate for long-term dialysis but family wants to continue intermittent dialysis for now.   Septic shock -Present on admission. Secondary to pneumonia. Patient required vasopressor support which is now weaned off. Resolved. Initial blood culture significant for likely contaminant. Repeat blood cultures negative.   Acute metabolic encephalopathy -Secondary to acute illness. CT head negative for acute process. Appears improved and likely back at baseline.   Diabetes mellitus, type 2 with hyperglycemia -Insulin needs have decreased. 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 Improving.   Quadriplegia -Secondary to compressive myelopathy. Patient resides at [facility] but per chart review, [facility] will not accept him back if he is HD dependent. -Education officer, museum following   Microcytic anemia Acute anemia Patient required 4 units of PRBC  during this hospitalization.   Thought to be secondary to a combination of acute illness, chronic disease and iron deficiency. IV iron given.  Hemoglobin currently stable.   Severe malnutrition -Continue tube feeds   Paroxysmal atrial fibrillation -Continue amiodarone   Pressure injury : Medial/posterior sacrum present on admission. Right/posterior/lateral ankle unknown if present on admission. Left ear, not present on admission.  -Continue local wound care  Generalized deconditioning -Overall prognosis is very poor.  Family wants to continue current treatment including trach/vent and intermittent hemodialysis.   DVT prophylaxis: Heparin Code Status:   Code Status: Partial Code Family Communication: None at bedside Disposition Plan: Status is: Inpatient  Remains inpatient appropriate because:Inpatient level of care appropriate due to severity of illness  Dispo: The patient is from:  [facility]              Anticipated d/c is to: SNF              Patient currently is not medically stable to d/c.   Difficult to place patient Yes  Consultants:  PCCM Nephrology Infectious disease Palliative care medicine  Procedures: CRRT followed by intermittent hemodialysis.  [facility] [facility] placed on 08/07/2020  Antimicrobials:  Anti-infectives (From admission, onward)    Start     Dose/Rate Route Frequency Ordered Stop   08/07/20 1012  ceFAZolin (ANCEF) IVPB 2g/100 mL premix        over 30 Minutes Intravenous Continuous PRN 08/07/20 1023 08/07/20 1012   08/07/20 0000  ceFAZolin (ANCEF) IVPB 2g/100 mL premix        2 g 200 mL/hr over 30 Minutes Intravenous To Radiology 08/05/20 1244 08/07/20 0013   08/06/20 0000  ceFAZolin (ANCEF) IVPB 2g/100 mL premix  Status:  Discontinued        2 g 200 mL/hr over 30 Minutes Intravenous To Radiology 08/05/20 1201 08/05/20 1244   07/25/20 1700  vancomycin (VANCOCIN) IVPB 1000 mg/200 mL premix  Status:  Discontinued        1,000 mg 200 mL/hr over 60 Minutes Intravenous Every 24 hours 07/24/20 1606  07/27/20 0815   07/24/20 1700  vancomycin (VANCOREADY) IVPB 1750 mg/350 mL        1,750 mg 175 mL/hr over 120 Minutes Intravenous  Once 07/24/20 1606 07/24/20 2005   07/19/20 1000  Ampicillin-Sulbactam (UNASYN) 3 g in sodium chloride 0.9 % 100 mL IVPB        3 g 200 mL/hr over 30 Minutes Intravenous Every 6 hours 07/19/20 0848 07/28/20 0342   07/18/20 1200  Ampicillin-Sulbactam (UNASYN) 3 g in sodium chloride 0.9 % 100 mL IVPB  Status:  Discontinued        3 g 200 mL/hr over 30 Minutes Intravenous Every 8 hours 07/18/20 0720 07/19/20 0848   07/14/20 1530  Ampicillin-Sulbactam (UNASYN) 3 g in sodium chloride 0.9 % 100 mL IVPB  Status:  Discontinued        3 g 200 mL/hr over 30 Minutes Intravenous Every 6 hours 07/14/20 1440 07/18/20 0720   07/13/20 2000  meropenem (MERREM) 2 g in sodium chloride 0.9 % 100 mL IVPB  Status:  Discontinued        2 g 200 mL/hr over 30 Minutes Intravenous Every 12 hours 07/13/20 1433 07/14/20 1440   07/13/20 1432  vancomycin variable dose per unstable renal function (pharmacist dosing)  Status:  Discontinued         Does not apply See admin instructions 07/13/20 1433 07/15/20 0937   07/13/20 1400  vancomycin (VANCOCIN)  IVPB 750 mg/150 ml premix  Status:  Discontinued        750 mg 150 mL/hr over 60 Minutes Intravenous Every 12 hours 07/13/20 1321 07/13/20 1430   07/12/20 1248  vancomycin (VANCOREADY) IVPB 750 mg/150 mL  Status:  Discontinued        750 mg 150 mL/hr over 60 Minutes Intravenous Every 12 hours 07/12/20 1248 07/13/20 1321   07/04/2020 2300  meropenem (MERREM) 2 g in sodium chloride 0.9 % 100 mL IVPB  Status:  Discontinued        2 g 200 mL/hr over 30 Minutes Intravenous Every 8 hours 07/08/2020 2209 07/13/20 1433   06/29/2020 2205  vancomycin variable dose per unstable renal function (pharmacist dosing)  Status:  Discontinued         Does not apply See admin instructions 06/29/2020 2209 07/12/20 1248   06/17/2020 1845  vancomycin (VANCOREADY) IVPB 1500  mg/300 mL        1,500 mg 150 mL/hr over 120 Minutes Intravenous  Once 06/29/2020 1843 07/02/2020 2132   06/15/2020 1845  ceFEPIme (MAXIPIME) 2 g in sodium chloride 0.9 % 100 mL IVPB        2 g 200 mL/hr over 30 Minutes Intravenous  Once 07/08/2020 1843 06/18/2020 1952        Subjective: Patient seen and examined at bedside.  No overnight fever, vomiting or seizures reported.  Objective: Vitals:   08/08/20 1100 08/08/20 1117 08/08/20 1200 08/08/20 1300  BP: (!) 150/80  117/65 102/64  Pulse: 100 98 88 78  Resp: (!) 30 (!) 30 (!) 30 (!) 30  Temp:      TempSrc:      SpO2: 100% 100% 100% 100%  Weight:      Height:        Intake/Output Summary (Last 24 hours) at 08/08/2020 1435 Last data filed at 08/08/2020 1400 Gross per 24 hour  Intake 1365 ml  Output 3200 ml  Net -1835 ml   Filed Weights   08/07/20 1345 08/07/20 1715 08/08/20 0500  Weight: 80.9 kg 78.9 kg 75.3 kg    Examination:  General exam: Appears chronically ill and deconditioned.  No distress currently.   ENT: On vent via trach.   Respiratory system: Bilateral decreased breath sounds at bases with scattered crackles and tachypnea Cardiovascular system: S1 & S2 heard, Rate controlled Gastrointestinal system: Abdomen is nondistended, soft and nontender. Normal bowel sounds heard.  PEG tube present. Extremities: No cyanosis, clubbing, edema  Central nervous system: Wakes up slightly, hardly follows commands.  Quadriplegia present.   Skin: No obvious ecchymosis/lesions Psychiatry: Could not be assessed because of mental status    Data Reviewed: I have personally reviewed following labs and imaging studies  CBC: Recent Labs  Lab 08/03/20 0527 08/04/20 1408 08/05/20 0352 08/06/20 0341 08/08/20 0431  WBC 21.4* 19.2* 18.5* 16.6* 18.3*  HGB 7.9* 8.7* 8.1* 8.3* 8.6*  HCT 23.6* 26.8* 24.9* 25.5* 27.0*  MCV 77.4* 82.2 81.1 81.5 83.3  PLT 215 PLATELET CLUMPS NOTED ON SMEAR, UNABLE TO ESTIMATE PLATELET CLUMPS NOTED ON  SMEAR, UNABLE TO ESTIMATE PLATELET CLUMPS NOTED ON SMEAR, UNABLE TO ESTIMATE 062   Basic Metabolic Panel: Recent Labs  Lab 08/04/20 0329 08/05/20 0352 08/06/20 0341 08/07/20 0906 08/08/20 0220  NA 134* 133* 130* 134* 134*  K 4.9 4.1 4.3 4.3 4.0  CL 94* 97* 93* 94* 98  CO2 _0 GLUCOSE 139* 142* 127* 114* 171*  BUN 98* 74* 103*  139* 72*  CREATININE 1.12 0.85 1.17 1.53* 1.01  CALCIUM 8.9 8.7* 8.8* 8.8* 8.6*  MG 2.1 2.1 2.1 2.3 2.1  PHOS 4.9* 4.7* 6.2* 7.1* 4.9*   GFR: Estimated Creatinine Clearance: 61.4 mL/min (by C-G formula based on SCr of 1.01 mg/dL). Liver Function Tests: Recent Labs  Lab 08/04/20 0329 08/05/20 0352 08/06/20 0341 08/07/20 0906 08/08/20 0220  ALBUMIN 2.4* 2.3* 2.3* 2.3* 2.3*   No results for input(s): LIPASE, AMYLASE in the last 168 hours. No results for input(s): AMMONIA in the last 168 hours. Coagulation Profile: No results for input(s): INR, PROTIME in the last 168 hours. Cardiac Enzymes: No results for input(s): CKTOTAL, CKMB, CKMBINDEX, TROPONINI in the last 168 hours. BNP (last 3 results) No results for input(s): PROBNP in the last 8760 hours. HbA1C: No results for input(s): HGBA1C in the last 72 hours. CBG: Recent Labs  Lab 08/07/20 1538 08/07/20 1933 08/07/20 2325 08/08/20 0328 08/08/20 0710  GLUCAP 146* 201* 181* 167* 166*   Lipid Profile: No results for input(s): CHOL, HDL, LDLCALC, TRIG, CHOLHDL, LDLDIRECT in the last 72 hours. Thyroid Function Tests: No results for input(s): TSH, T4TOTAL, FREET4, T3FREE, THYROIDAB in the last 72 hours. Anemia Panel: No results for input(s): VITAMINB12, FOLATE, FERRITIN, TIBC, IRON, RETICCTPCT in the last 72 hours. Sepsis Labs: No results for input(s): PROCALCITON, LATICACIDVEN in the last 168 hours.  No results found for this or any previous visit (from the past 240 hour(s)).       Radiology Studies: IR Fluoro Guide CV Line Right  Result Date: 08/07/2020 Vascular and  Interventional Radiology Procedure Note Patient: Luis Orr DOB: 05/18/1950 Medical Record Number: [id number] Note Date/Time: 08/07/20 10:52 AM Performing Physician: Michaelle Birks, MD Assistant(s): None Diagnosis: ESRD requiring Hemodialysis Procedure: TUNNELED HEMODIALYSIS CATHETER PLACEMENT Anesthesia: Conscious Sedation Complications: None Estimated Blood Loss: Minimal Specimens:  None Findings: Successful placement of right-sided, 23 cm (tip-to-cuff), tunneled hemodialysis catheter with the tip of the catheter in the proximal right atrium. Plan: Catheter ready for use. See detailed procedure note with images in PACS. The patient tolerated the procedure well without incident or complication and was returned to ICU in stable condition. Michaelle Birks, MD [facility] Radiology   IR Fluoro Guide CV Line Right  Result Date: 08/07/2020 Vascular and Interventional Radiology Procedure Note Patient: Luis Orr DOB: [birth year] Medical Record Number: [id number] Note Date/Time: 08/07/20 10:52 AM Performing Physician: Michaelle Birks, MD Assistant(s): None Diagnosis: ESRD requiring Hemodialysis Procedure: TUNNELED HEMODIALYSIS CATHETER PLACEMENT Anesthesia: Conscious Sedation Complications: None Estimated Blood Loss: Minimal Specimens:  None Findings: Successful placement of right-sided, 23 cm (tip-to-cuff), tunneled hemodialysis catheter with the tip of the catheter in the proximal right atrium. Plan: Catheter ready for use. See detailed procedure note with images in PACS. The patient tolerated the procedure well without incident or complication and was returned to ICU in stable condition. Michaelle Birks, MD [facility] Radiology   IR US Guide Vasc Access Right  Result Date: 08/07/2020 Vascular and Interventional Radiology Procedure Note Patient: Luis Orr DOB: 10-Sep-1950 Medical Record Number: [id number] Note  Date/Time: 08/07/20 10:52 AM Performing Physician: Michaelle Birks, MD Assistant(s): None Diagnosis: ESRD requiring Hemodialysis Procedure: TUNNELED HEMODIALYSIS CATHETER PLACEMENT Anesthesia: Conscious Sedation Complications: None Estimated Blood Loss: Minimal Specimens:  None Findings: Successful placement of right-sided, 23 cm (tip-to-cuff), tunneled hemodialysis catheter with the tip of the catheter in the proximal right atrium. Plan: Catheter ready for use. See detailed procedure note with images  in PACS. The patient tolerated the procedure well without incident or complication and was returned to ICU in stable condition. Michaelle Birks, MD [facility] Radiology        Scheduled Meds:  amiodarone  200 mg Per Tube Daily   buPROPion  75 mg Per Tube BID   chlorhexidine gluconate (MEDLINE KIT)  15 mL Mouth Rinse BID   Chlorhexidine Gluconate Cloth  6 each Topical Q0600   darbepoetin (ARANESP) injection - DIALYSIS  150 mcg Intravenous Q Wed-HD   docusate  100 mg Per Tube BID   feeding supplement (PROSource TF)  45 mL Per Tube TID   guaiFENesin  5 mL Per Tube BID   heparin injection (subcutaneous)  5,000 Units Subcutaneous Q8H   insulin aspart  2 Units Subcutaneous Q4H   insulin glargine  10 Units Subcutaneous Daily   mouth rinse  15 mL Mouth Rinse 10 times per day   midodrine  10 mg Per Tube TID   multivitamin  1 tablet Per Tube QHS   nutrition supplement (JUVEN)  1 packet Per Tube BID BM   pantoprazole sodium  40 mg Per Tube QHS   polyethylene glycol  17 g Per Tube Daily   sertraline  100 mg Per Tube Daily   sodium chloride flush  10-40 mL Intracatheter Q12H   Continuous Infusions:  sodium chloride Stopped (07/26/20 0824)   sodium chloride 250 mL (07/31/20 1428)   sodium chloride     sodium chloride     feeding supplement (NEPRO CARB STEADY) 1,000 mL (08/08/20 1221)          Aline August, MD [facility] 08/08/2020, 2:35 PM

## 2020-08-09 ENCOUNTER — Inpatient Hospital Stay (HOSPITAL_COMMUNITY): Payer: 59

## 2020-08-09 DIAGNOSIS — J9601 Acute respiratory failure with hypoxia: Secondary | ICD-10-CM | POA: Diagnosis not present

## 2020-08-09 DIAGNOSIS — A419 Sepsis, unspecified organism: Secondary | ICD-10-CM | POA: Diagnosis not present

## 2020-08-09 DIAGNOSIS — N179 Acute kidney failure, unspecified: Secondary | ICD-10-CM | POA: Diagnosis not present

## 2020-08-09 DIAGNOSIS — G8250 Quadriplegia, unspecified: Secondary | ICD-10-CM | POA: Diagnosis not present

## 2020-08-09 LAB — CBC WITH DIFFERENTIAL/PLATELET
Abs Immature Granulocytes: 0.21 10*3/uL — ABNORMAL HIGH (ref 0.00–0.07)
Basophils Absolute: 0 10*3/uL (ref 0.0–0.1)
Basophils Relative: 0 %
Eosinophils Absolute: 0.1 10*3/uL (ref 0.0–0.5)
Eosinophils Relative: 0 %
HCT: 26.6 % — ABNORMAL LOW (ref 39.0–52.0)
Hemoglobin: 8.7 g/dL — ABNORMAL LOW (ref 13.0–17.0)
Immature Granulocytes: 2 %
Lymphocytes Relative: 7 %
Lymphs Abs: 0.9 10*3/uL (ref 0.7–4.0)
MCH: 27.1 pg (ref 26.0–34.0)
MCHC: 32.7 g/dL (ref 30.0–36.0)
MCV: 82.9 fL (ref 80.0–100.0)
Monocytes Absolute: 1.2 10*3/uL — ABNORMAL HIGH (ref 0.1–1.0)
Monocytes Relative: 9 %
Neutro Abs: 11.6 10*3/uL — ABNORMAL HIGH (ref 1.7–7.7)
Neutrophils Relative %: 82 %
Platelets: 304 10*3/uL (ref 150–400)
RBC: 3.21 MIL/uL — ABNORMAL LOW (ref 4.22–5.81)
RDW: 23.4 % — ABNORMAL HIGH (ref 11.5–15.5)
WBC: 14 10*3/uL — ABNORMAL HIGH (ref 4.0–10.5)
nRBC: 0.5 % — ABNORMAL HIGH (ref 0.0–0.2)

## 2020-08-09 LAB — GLUCOSE, CAPILLARY
Glucose-Capillary: 142 mg/dL — ABNORMAL HIGH (ref 70–99)
Glucose-Capillary: 144 mg/dL — ABNORMAL HIGH (ref 70–99)
Glucose-Capillary: 150 mg/dL — ABNORMAL HIGH (ref 70–99)
Glucose-Capillary: 156 mg/dL — ABNORMAL HIGH (ref 70–99)
Glucose-Capillary: 207 mg/dL — ABNORMAL HIGH (ref 70–99)
Glucose-Capillary: 227 mg/dL — ABNORMAL HIGH (ref 70–99)

## 2020-08-09 LAB — RENAL FUNCTION PANEL
Albumin: 2.3 g/dL — ABNORMAL LOW (ref 3.5–5.0)
Anion gap: 14 (ref 5–15)
BUN: 120 mg/dL — ABNORMAL HIGH (ref 8–23)
CO2: 24 mmol/L (ref 22–32)
Calcium: 9.1 mg/dL (ref 8.9–10.3)
Chloride: 95 mmol/L — ABNORMAL LOW (ref 98–111)
Creatinine, Ser: 1.33 mg/dL — ABNORMAL HIGH (ref 0.61–1.24)
GFR, Estimated: 58 mL/min — ABNORMAL LOW (ref >60)
Glucose, Bld: 152 mg/dL — ABNORMAL HIGH (ref 70–99)
Phosphorus: 5.5 mg/dL — ABNORMAL HIGH (ref 2.5–4.6)
Potassium: 4 mmol/L (ref 3.5–5.1)
Sodium: 133 mmol/L — ABNORMAL LOW (ref 135–145)

## 2020-08-09 LAB — MAGNESIUM: Magnesium: 2.1 mg/dL (ref 1.7–2.4)

## 2020-08-09 MED ORDER — APIXABAN 5 MG PO TABS
5.0000 mg | ORAL_TABLET | Freq: Two times a day (BID) | ORAL | Status: DC
  Administered 2020-08-09 – 2020-08-31 (×44): 5 mg
  Filled 2020-08-09 (×44): qty 1

## 2020-08-09 MED ORDER — APIXABAN 5 MG PO TABS: 5.0000 mg | ORAL_TABLET | Freq: Two times a day (BID) | ORAL | Status: DC

## 2020-08-09 NOTE — Progress Notes (Signed)
Patient ID: Luis Orr, male   DOB: 01-Nov-1950, 70 y.o.   MRN: [id number]  PROGRESS NOTE    Luis Orr  TKP:[id number] DOB: July 31, 1950 DOA: 06/27/2020 PCP: Townsend Roger, MD   Brief Narrative:  70 y.o. male from [facility] with hx of C3-C5 compressive myelopathy with functional quadriplegia and chronic trach/vent presented secondary to altered mental status and hypotension with recurrent pneumonia requiring vasopressor support and ICU admission.  ICU significant events: 6/29 admitted to ICU for septic shock on levo to maintain MAP>65, continued on vent support 6/29 Blood Cx>> staph species in 1 of 4 cultures drawn>> suspect contaminant 6/30 Off pressors 7/2 ID Consult for MDR acinetobacter in trach asp, Meropenem changed to unasyn 7/2 PRBC transfusion, iron replacement 7/5 Pressors added again. Central line placed. Drainage from gastrostomy tube >> CT Abd without fluid collection around gastrostomy tube but with diffuse body wall edema. Echo EF 50-55%, mild LVH 7/6 Renal Replacement Therapy ; Code status changed to partial Code (DNR) 7/7 Transfuse 1U PRBC 7/9 add solu cortef 7/10 off pressors, weaning stress steroids 7/11 Pressors added back on again 7/12 ID consult due to rising WBC, hypothermia, hypotension concern for worsening infection. Vancomycin added. Repeat blood cx collected. 7/15 CRRT stopped 7/17 Restarted on low dose levo. No evidence of new infection. Held off on antibiotics. Nephrology contacted family and decision was made to continue iHD this week to allow additional time for renal recovery. Patient is not a long term iHD candidate 7/18 plans for iHD, tolerated with 2L removed 7/19 slight drop in hgb to 6.9 will transfuse 1 unit PRBC 7/22>7/26: Tolerating HD. TOC consulted for placement. Will likely need out of state placement.   He was subsequently transferred to [facility] service.  Assessment & Plan:   Acute on chronic hypoxic and hypercapnic respiratory  failure -Secondary to pneumonia/volume overload. Now resolved and back to baseline. Currently on ventilator support per PCCM and is at baseline.  PCCM following intermittently  Ventilator associated pneumonia -Present on admission. Sputum culture significant for acinetobacter baumannii. -Patient was treated with Unasyn x14 days. Completed course.   Leukocytosis -Secondary to above -Improving; monitor  Volume overload -Secondary to renal failure with anuria. Patient has improved with CRRT and now on intermittent hemodialysis.  Still with anasarca. Patient will continue hemodialysis   AKI with oliguria -Now appears patient is anuric.  -Nephrology following: Was initially on CRRT and currently on intermittent HD.  Issues with HD are that patient cannot go back to his SNF. Palliative care has been consulted and are following. [place] placed on 08/07/2020 -Patient is a poor candidate for long-term dialysis but family wants to continue intermittent dialysis for now.   Septic shock -Present on admission. Secondary to pneumonia. Patient required vasopressor support which is now weaned off. Resolved. Initial blood culture significant for likely contaminant. Repeat blood cultures negative.   Acute metabolic encephalopathy -Secondary to acute illness. CT head negative for acute process. Appears improved and likely back at baseline.   Diabetes mellitus, type 2 with hyperglycemia -Insulin needs have decreased. 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 Improving.   Quadriplegia -Secondary to compressive myelopathy. Patient resides at [facility] but per chart review, [facility] will not accept him back if he is HD dependent. -Education officer, museum following   Microcytic anemia Acute anemia Patient required 4 units of PRBC during  this hospitalization.  Thought to  be secondary to a combination of acute illness, chronic disease and iron deficiency. IV iron given.  Hemoglobin currently stable.   Severe malnutrition -Continue tube feeds   Paroxysmal atrial fibrillation -Continue amiodarone   Pressure injury : Medial/posterior sacrum present on admission. Right/posterior/lateral ankle unknown if present on admission. Left ear, not present on admission.  -Continue local wound care  Generalized deconditioning -Overall prognosis is very poor.  Family wants to continue current treatment including trach/vent and intermittent hemodialysis.   DVT prophylaxis: Heparin Code Status:   Code Status: Partial Code Family Communication: None at bedside Disposition Plan: Status is: Inpatient  Remains inpatient appropriate because:Inpatient level of care appropriate due to severity of illness  Dispo: The patient is from:  [facility]              Anticipated d/c is to: SNF              Patient currently is not medically stable to d/c.   Difficult to place patient Yes  Consultants:  PCCM Nephrology Infectious disease Palliative care medicine  Procedures: CRRT followed by intermittent hemodialysis.  [place] placed on 08/07/2020  Antimicrobials:  Anti-infectives (From admission, onward)    Start     Dose/Rate Route Frequency Ordered Stop   08/07/20 1012  ceFAZolin (ANCEF) IVPB 2g/100 mL premix        over 30 Minutes Intravenous Continuous PRN 08/07/20 1023 08/07/20 1012   08/07/20 0000  ceFAZolin (ANCEF) IVPB 2g/100 mL premix        2 g 200 mL/hr over 30 Minutes Intravenous To Radiology 08/05/20 1244 08/07/20 0013   08/06/20 0000  ceFAZolin (ANCEF) IVPB 2g/100 mL premix  Status:  Discontinued        2 g 200 mL/hr over 30 Minutes Intravenous To Radiology 08/05/20 1201 08/05/20 1244   07/25/20 1700  vancomycin (VANCOCIN) IVPB 1000 mg/200 mL premix  Status:  Discontinued        1,000 mg 200 mL/hr over 60 Minutes Intravenous Every 24 hours 07/24/20 1606 07/27/20 0815    07/24/20 1700  vancomycin (VANCOREADY) IVPB 1750 mg/350 mL        1,750 mg 175 mL/hr over 120 Minutes Intravenous  Once 07/24/20 1606 07/24/20 2005   07/19/20 1000  Ampicillin-Sulbactam (UNASYN) 3 g in sodium chloride 0.9 % 100 mL IVPB        3 g 200 mL/hr over 30 Minutes Intravenous Every 6 hours 07/19/20 0848 07/28/20 0342   07/18/20 1200  Ampicillin-Sulbactam (UNASYN) 3 g in sodium chloride 0.9 % 100 mL IVPB  Status:  Discontinued        3 g 200 mL/hr over 30 Minutes Intravenous Every 8 hours 07/18/20 0720 07/19/20 0848   07/14/20 1530  Ampicillin-Sulbactam (UNASYN) 3 g in sodium chloride 0.9 % 100 mL IVPB  Status:  Discontinued        3 g 200 mL/hr over 30 Minutes Intravenous Every 6 hours 07/14/20 1440 07/18/20 0720   07/13/20 2000  meropenem (MERREM) 2 g in sodium chloride 0.9 % 100 mL IVPB  Status:  Discontinued        2 g 200 mL/hr over 30 Minutes Intravenous Every 12 hours 07/13/20 1433 07/14/20 1440   07/13/20 1432  vancomycin variable dose per unstable renal function (pharmacist dosing)  Status:  Discontinued         Does not apply See admin instructions 07/13/20 1433 07/15/20 0937   07/13/20 1400  vancomycin (VANCOCIN) IVPB  750 mg/150 ml premix  Status:  Discontinued        750 mg 150 mL/hr over 60 Minutes Intravenous Every 12 hours 07/13/20 1321 07/13/20 1430   07/12/20 1248  vancomycin (VANCOREADY) IVPB 750 mg/150 mL  Status:  Discontinued        750 mg 150 mL/hr over 60 Minutes Intravenous Every 12 hours 07/12/20 1248 07/13/20 1321   06/24/2020 2300  meropenem (MERREM) 2 g in sodium chloride 0.9 % 100 mL IVPB  Status:  Discontinued        2 g 200 mL/hr over 30 Minutes Intravenous Every 8 hours 07/10/2020 2209 07/13/20 1433   06/29/2020 2205  vancomycin variable dose per unstable renal function (pharmacist dosing)  Status:  Discontinued         Does not apply See admin instructions 06/28/2020 2209 07/12/20 1248   06/22/2020 1845  vancomycin (VANCOREADY) IVPB 1500 mg/300 mL         1,500 mg 150 mL/hr over 120 Minutes Intravenous  Once 07/07/2020 1843 06/14/2020 2132   07/05/2020 1845  ceFEPIme (MAXIPIME) 2 g in sodium chloride 0.9 % 100 mL IVPB        2 g 200 mL/hr over 30 Minutes Intravenous  Once 07/05/2020 1843 07/07/2020 1952        Subjective: Patient seen and examined at bedside.  No seizures, vomiting or fever reported by nursing staff. Objective: Vitals:   08/09/20 0359 08/09/20 0400 08/09/20 0500 08/09/20 0600  BP: 130/80 132/80 130/77 109/63  Pulse: 72     Resp: (!) 30 (!) 30 (!) 30 (!) 30  Temp:      TempSrc:      SpO2: 100%     Weight:   76 kg   Height:        Intake/Output Summary (Last 24 hours) at 08/09/2020 0726 Last data filed at 08/08/2020 1806 Gross per 24 hour  Intake 855 ml  Output 325 ml  Net 530 ml    Filed Weights   08/07/20 1715 08/08/20 0500 08/09/20 0500  Weight: 78.9 kg 75.3 kg 76 kg    Examination:  General exam: No acute distress.  Appears chronically ill and deconditioned.  ENT: Still on vent via trach Respiratory system: Decreased breath sounds at bases bilaterally with some crackles and intermittent tachypnea  cardiovascular system: Rate controlled, S1-S2 heard Gastrointestinal system: Abdomen is slightly distended, soft and nontender.  Bowel sounds are heard.  PEG tube present. Extremities: Bilateral lower extremity edema present; no clubbing Central nervous system: Wakes up very slightly; hardly follows any commands.  Quadriplegia present.   Skin: No obvious petechiae/rashes Psychiatry: Cannot assess because of mental status    Data Reviewed: I have personally reviewed following labs and imaging studies  CBC: Recent Labs  Lab 08/04/20 1408 08/05/20 0352 08/06/20 0341 08/08/20 0431 08/09/20 0022  WBC 19.2* 18.5* 16.6* 18.3* 14.0*  NEUTROABS  --   --   --   --  11.6*  HGB 8.7* 8.1* 8.3* 8.6* 8.7*  HCT 26.8* 24.9* 25.5* 27.0* 26.6*  MCV 82.2 81.1 81.5 83.3 82.9  PLT PLATELET CLUMPS NOTED ON SMEAR, UNABLE TO  ESTIMATE PLATELET CLUMPS NOTED ON SMEAR, UNABLE TO ESTIMATE PLATELET CLUMPS NOTED ON SMEAR, UNABLE TO ESTIMATE 315 263    Basic Metabolic Panel: Recent Labs  Lab 08/05/20 0352 08/06/20 0341 08/07/20 0906 08/08/20 0220 08/09/20 0022  NA 133* 130* 134* 134* 133*  K 4.1 4.3 4.3 4.0 4.0  CL 97* 93* 94* 98 95*  CO2 _0 GLUCOSE 142* 127* 114* 171* 152*  BUN 74* 103* 139* 72* 120*  CREATININE 0.85 1.17 1.53* 1.01 1.33*  CALCIUM 8.7* 8.8* 8.8* 8.6* 9.1  MG 2.1 2.1 2.3 2.1 2.1  PHOS 4.7* 6.2* 7.1* 4.9* 5.5*    GFR: Estimated Creatinine Clearance: 46.6 mL/min (A) (by C-G formula based on SCr of 1.33 mg/dL (H)). Liver Function Tests: Recent Labs  Lab 08/05/20 0352 08/06/20 0341 08/07/20 0906 08/08/20 0220 08/09/20 0022  ALBUMIN 2.3* 2.3* 2.3* 2.3* 2.3*    No results for input(s): LIPASE, AMYLASE in the last 168 hours. No results for input(s): AMMONIA in the last 168 hours. Coagulation Profile: No results for input(s): INR, PROTIME in the last 168 hours. Cardiac Enzymes: No results for input(s): CKTOTAL, CKMB, CKMBINDEX, TROPONINI in the last 168 hours. BNP (last 3 results) No results for input(s): PROBNP in the last 8760 hours. HbA1C: No results for input(s): HGBA1C in the last 72 hours. CBG: Recent Labs  Lab 08/08/20 0710 08/08/20 1533 08/08/20 1943 08/08/20 2345 08/09/20 0324  GLUCAP 166* 127* 136* 151* 144*    Lipid Profile: No results for input(s): CHOL, HDL, LDLCALC, TRIG, CHOLHDL, LDLDIRECT in the last 72 hours. Thyroid Function Tests: No results for input(s): TSH, T4TOTAL, FREET4, T3FREE, THYROIDAB in the last 72 hours. Anemia Panel: No results for input(s): VITAMINB12, FOLATE, FERRITIN, TIBC, IRON, RETICCTPCT in the last 72 hours. Sepsis Labs: No results for input(s): PROCALCITON, LATICACIDVEN in the last 168 hours.  No results found for this or any previous visit (from the past 240 hour(s)).       Radiology Studies: IR Fluoro Guide CV  Line Right  Result Date: 08/07/2020 Vascular and Interventional Radiology Procedure Note Patient: Luis Orr DOB: 30-Oct-1950 Medical Record Number: [id number] Note Date/Time: 08/07/20 10:52 AM Performing Physician: Michaelle Birks, MD Assistant(s): None Diagnosis: ESRD requiring Hemodialysis Procedure: TUNNELED HEMODIALYSIS CATHETER PLACEMENT Anesthesia: Conscious Sedation Complications: None Estimated Blood Loss: Minimal Specimens:  None Findings: Successful placement of right-sided, 23 cm (tip-to-cuff), tunneled hemodialysis catheter with the tip of the catheter in the proximal right atrium. Plan: Catheter ready for use. See detailed procedure note with images in PACS. The patient tolerated the procedure well without incident or complication and was returned to ICU in stable condition. Michaelle Birks, MD [facility] Radiology   IR Fluoro Guide CV Line Right  Result Date: 08/07/2020 Vascular and Interventional Radiology Procedure Note Patient: Luis Orr DOB: September 06, 1950 Medical Record Number: [id number] Note Date/Time: 08/07/20 10:52 AM Performing Physician: Michaelle Birks, MD Assistant(s): None Diagnosis: ESRD requiring Hemodialysis Procedure: TUNNELED HEMODIALYSIS CATHETER PLACEMENT Anesthesia: Conscious Sedation Complications: None Estimated Blood Loss: Minimal Specimens:  None Findings: Successful placement of right-sided, 23 cm (tip-to-cuff), tunneled hemodialysis catheter with the tip of the catheter in the proximal right atrium. Plan: Catheter ready for use. See detailed procedure note with images in PACS. The patient tolerated the procedure well without incident or complication and was returned to ICU in stable condition. Michaelle Birks, MD [facility] Radiology   IR US Guide Vasc Access Right  Result Date: 08/07/2020 Vascular and Interventional Radiology Procedure Note Patient: Luis Orr DOB:  [birth year] Medical Record Number: [id number] Note Date/Time: 08/07/20 10:52 AM Performing Physician: Michaelle Birks, MD Assistant(s): None Diagnosis: ESRD requiring Hemodialysis Procedure: TUNNELED HEMODIALYSIS CATHETER PLACEMENT Anesthesia: Conscious Sedation Complications: None Estimated Blood Loss: Minimal Specimens:  None Findings: Successful placement of right-sided, 23 cm (tip-to-cuff), tunneled hemodialysis  catheter with the tip of the catheter in the proximal right atrium. Plan: Catheter ready for use. See detailed procedure note with images in PACS. The patient tolerated the procedure well without incident or complication and was returned to ICU in stable condition. Michaelle Birks, MD [facility] Radiology   DG CHEST PORT 1 VIEW  Result Date: 08/09/2020 CLINICAL DATA:  Acute respiratory failure.  Hypoxia. EXAM: PORTABLE CHEST 1 VIEW COMPARISON:  07/27/2020. FINDINGS: Interval removal of right IJ line and placement of dual-lumen catheter. Dual lumen catheter tip at cavoatrial junction. Tracheostomy tube in stable position. Heart size normal. Progressive bibasilar pulmonary infiltrates/edema. Small bilateral pleural effusions again noted. No pneumothorax. IMPRESSION: 1. Interim removal of right IJ line and placement of dual-lumen catheter. Dual-lumen catheter tip at cavoatrial junction. Tracheostomy tube in stable position. 2. Progressive bibasilar pulmonary infiltrates/edema. Small bilateral pleural effusions again noted. Electronically Signed   By: Marcello Moores  Register   On: 08/09/2020 06:03        Scheduled Meds:  amiodarone  200 mg Per Tube Daily   buPROPion  75 mg Per Tube BID   chlorhexidine gluconate (MEDLINE KIT)  15 mL Mouth Rinse BID   Chlorhexidine Gluconate Cloth  6 each Topical Q0600   darbepoetin (ARANESP) injection - DIALYSIS  150 mcg Intravenous Q Wed-HD   docusate  100 mg Per Tube BID   feeding supplement (PROSource TF)  45 mL Per Tube  TID   guaiFENesin  5 mL Per Tube BID   heparin injection (subcutaneous)  5,000 Units Subcutaneous Q8H   insulin aspart  2 Units Subcutaneous Q4H   insulin glargine  10 Units Subcutaneous Daily   mouth rinse  15 mL Mouth Rinse 10 times per day   midodrine  10 mg Per Tube TID   multivitamin  1 tablet Per Tube QHS   nutrition supplement (JUVEN)  1 packet Per Tube BID BM   pantoprazole sodium  40 mg Per Tube QHS   polyethylene glycol  17 g Per Tube Daily   sertraline  100 mg Per Tube Daily   sodium chloride flush  10-40 mL Intracatheter Q12H   Continuous Infusions:  sodium chloride Stopped (07/26/20 0824)   sodium chloride 250 mL (07/31/20 1428)   sodium chloride     sodium chloride     feeding supplement (NEPRO CARB STEADY) 1,000 mL (08/08/20 1221)          Aline August, MD [facility] 08/09/2020, 7:26 AM

## 2020-08-09 NOTE — Progress Notes (Signed)
Provided video call with Wife, June. Patient opened his eyes to her voice and looked around the room for her.

## 2020-08-09 NOTE — Progress Notes (Deleted)
[facility] Progress Note     Assessment/ Plan:   AKI -baseline Cr 0.4. AKI due to ATN /shock. CRRT initiated on 7/6 for anasarca with ineffective diuresis and uremia.  CCM for rij temp line placement 86/41- 47 days old - CRRT 7/6 - 7/15, now off due to improved hemodynamics -Not a good long term candidate for HD due to poor functional status, vent dependency. Dr. Candiss Norse voiced this concern to both son and wife over the phone on 7/6 and they wished to proceed with RRT.  Appreciate palliative care's assistance.  Dr. Johnney Ou discussed concern for long term dialysis to wife and son 7/12 and again on 7/17.  The plan was continue iHD through this past week given he's out of shock phase but if no signs of recovery then reconvene.  He already had been living at [facility] for several years prior to this admission.  Palliative care has been meeting with family daily and family not willing to stop dialysis at this point.     Had iHD early Sat AM; last HD 7/26 UF net 3L tolerated. Next treatment planned for today  on TTS schedule.  Appreciate VIR converting to  tunneled HD cath 7/26. 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 give iron load as severely iron deficient on 6/30 labs (t sat 5%) - last given blood on 7/19. on ESA   Dispo:  with comorbids not a good long term dialysis candidate.  Family wish to continue  current care.   He is technically tolerating IHD -  continuing HD this past week with old non tunneled cath.  Based on previous conversations I think family will want to continue with HD-  finding a place for him to go post hosp will be very difficult - likely req out of state facility  Subjective:   No changes overnight. TM 100.2 overnight.   Objective:   BP 109/63   Pulse 72   Temp 98.1 F (36.7 C) (Oral)   Resp (!) 30   Ht _0  (1.676 m)   Wt 76 kg   SpO2 100%   BMI 27.04 kg/m   Intake/Output Summary (Last 24 hours) at 08/09/2020 2376 Last data filed at 08/08/2020 1806 Gross per 24 hour  Intake 810 ml  Output 325 ml  Net 485 ml   Weight change: -4.9 kg  Physical Exam: EGB:[id number] ill appearing-  eyes open-  seems to understand what I am saying CVS:s1s2, rrr Resp:trached, bl chest expansion, on full vent support TGG:YIRS Ext:  2+ diffuse edema but better Neuro: functional quadriplegic, opening eyes, looking around  Imaging: IR Fluoro Guide CV Line Right  Result Date: 08/07/2020 Vascular and Interventional Radiology Procedure Note Patient: AADYN BUCHHEIT DOB: 04/12/1950 Medical Record Number: [id number] Note Date/Time: 08/07/20 10:52 AM Performing Physician: Michaelle Birks, MD Assistant(s): None Diagnosis: ESRD requiring Hemodialysis Procedure: TUNNELED HEMODIALYSIS CATHETER PLACEMENT Anesthesia: Conscious Sedation Complications:  None Estimated Blood Loss: Minimal Specimens:  None Findings: Successful placement of right-sided, 23 cm (tip-to-cuff), tunneled hemodialysis catheter with the tip of the catheter in the proximal right atrium. Plan: Catheter ready for use. See detailed procedure note with images in PACS. The patient tolerated the procedure well without incident or complication and was returned to ICU in stable condition. Michaelle Birks, MD [facility] Inter[facility] Radiology [facility] Radiology   IR Fluoro Guide CV Line Right  Result Date:  08/07/2020 Vascular and Interventional Radiology Procedure Note Patient: JONANTHONY NAHAR DOB: [birth year] Medical Record Number: [id number] Note Date/Time: 08/07/20 10:52 AM Performing Physician: Michaelle Birks, MD Assistant(s): None Diagnosis: ESRD requiring Hemodialysis Procedure: TUNNELED HEMODIALYSIS CATHETER PLACEMENT Anesthesia: Conscious Sedation Complications: None Estimated Blood Loss: Minimal Specimens:  None Findings: Successful placement of right-sided, 23 cm (tip-to-cuff), tunneled hemodialysis catheter with the tip of the catheter in the proximal right atrium. Plan: Catheter ready for use. See detailed procedure note with images in PACS. The patient tolerated the procedure well without incident or complication and was returned to ICU in stable condition. Michaelle Birks, MD [facility]iology [facility] Radiology   IR US Guide Vasc Access Right  Result Date: 08/07/2020 Vascular and Interventional Radiology Procedure Note Patient: SYD NEWSOME DOB: January 02, 1951 Medical Record Number: [id number] Note Date/Time: 08/07/20 10:52 AM Performing Physician: Michaelle Birks, MD Assistant(s): None Diagnosis: ESRD requiring Hemodialysis Procedure: TUNNELED HEMODIALYSIS CATHETER PLACEMENT Anesthesia: Conscious Sedation Complications: None Estimated Blood Loss: Minimal Specimens:  None Findings: Successful placement of right-sided, 23 cm (tip-to-cuff), tunneled hemodialysis catheter with the tip of the catheter in the proximal right atrium. Plan: Catheter ready for use. See detailed procedure note with images in PACS. The patient tolerated the procedure well without incident or complication and was returned to ICU in stable condition. Michaelle Birks, MD [facility] Interventional Radiology [facility] Radiology   DG CHEST PORT 1 VIEW  Result Date: 08/09/2020 CLINICAL DATA:  Acute respiratory failure.  Hypoxia. EXAM: PORTABLE CHEST 1 VIEW COMPARISON:  07/27/2020. FINDINGS:  Interval removal of right IJ line and placement of dual-lumen catheter. Dual lumen catheter tip at cavoatrial junction. Tracheostomy tube in stable position. Heart size normal. Progressive bibasilar pulmonary infiltrates/edema. Small bilateral pleural effusions again noted. No pneumothorax. IMPRESSION: 1. Interim removal of right IJ line and placement of dual-lumen catheter. Dual-lumen catheter tip at cavoatrial junction. Tracheostomy tube in stable position. 2. Progressive bibasilar pulmonary infiltrates/edema. Small bilateral pleural effusions again noted. Electronically Signed   By: Marcello Moores  Register   On: 08/09/2020 06:03    Labs: BMET Recent Labs  Lab 08/03/20 6203 08/04/20 5597 08/05/20 0352 08/06/20 0341 08/07/20 0906 08/08/20 0220 08/09/20 0022  NA 136 134* 133* 130* 134* 134* 133*  K 3.0* 4.9 4.1 4.3 4.3 4.0 4.0  CL 98 94* 97* 93* 94* 98 95*  CO2 _0 GLUCOSE 117* 139* 142* 127* 114* 171* 152*  BUN 72* 98* 74* 103* 139* 72* 120*  CREATININE 0.88 1.12 0.85 1.17 1.53* 1.01 1.33*  CALCIUM 8.4* 8.9 8.7* 8.8* 8.8* 8.6* 9.1  PHOS 3.9 4.9* 4.7* 6.2* 7.1* 4.9* 5.5*   CBC Recent Labs  Lab 08/05/20 0352 08/06/20 0341 08/08/20 0431 08/09/20 0022  WBC 18.5* 16.6* 18.3* 14.0*  NEUTROABS  --   --   --  11.6*  HGB 8.1* 8.3* 8.6* 8.7*  HCT 24.9* 25.5* 27.0* 26.6*  MCV 81.1 81.5 83.3 82.9  PLT PLATELET CLUMPS NOTED ON SMEAR, UNABLE TO ESTIMATE  PLATELET CLUMPS NOTED ON SMEAR, UNABLE TO ESTIMATE 315 304    Medications:     amiodarone  200 mg Per Tube Daily   buPROPion  75 mg Per Tube BID   chlorhexidine gluconate (MEDLINE KIT)  15 mL Mouth Rinse BID   Chlorhexidine Gluconate Cloth  6 each Topical Q0600   darbepoetin (ARANESP) injection - DIALYSIS  150 mcg Intravenous Q Wed-HD   docusate  100 mg Per Tube BID   feeding supplement (PROSource TF)  45 mL Per Tube TID   guaiFENesin  5 mL Per Tube BID   heparin injection (subcutaneous)  5,000 Units Subcutaneous Q8H    insulin aspart  2 Units Subcutaneous Q4H   insulin glargine  10 Units Subcutaneous Daily   mouth rinse  15 mL Mouth Rinse 10 times per day   midodrine  10 mg Per Tube TID   multivitamin  1 tablet Per Tube QHS   nutrition supplement (JUVEN)  1 packet Per Tube BID BM   pantoprazole sodium  40 mg Per Tube QHS   polyethylene glycol  17 g Per Tube Daily   sertraline  100 mg Per Tube Daily   sodium chloride flush  10-40 mL Intracatheter Q12H      [name], MD 08/09/2020, 8:39 AM

## 2020-08-09 NOTE — Progress Notes (Signed)
NAME:  Luis Orr, MRN:  [id number], DOB:  12-Sep-1950, LOS: 73 ADMISSION DATE:  06/21/2020, CONSULTATION DATE:  06/23/2020 REFERRING MD:  Dr Francia Greaves, EDP CHIEF COMPLAINT:  septic shock    History of Present Illness:  70 yo male from [facility] with hx of C3-C5 compressive myelopathy with functional quadriplegia and chronic trach/vent presented to ED with altered mental status and hypotension with recurrent HCAP.  Has multiple recent admissions for sepsis secondary to various infections including UTIs, pneumonias and bacteremia in setting of MDR Klebsiella, MRSA and pseudomonas. Most recent admission September 2021 in setting of sepsis due to MDR Klebsiella with right ureteral calculus s/p double J ureteral stent placement and treated with meropenem x 10days.  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 Central line placed. Drainage from gastrostomy tube >> CT Abd without fluid collection around gastrostomy tube but with diffuse body wall edema. Echo EF 50-55%, mild LVH  7/6 Renal Replacement Therapy ; Code status changed to partial Code (DNR) 7/7 Transfuse 1U PRBC 7/9 add solu cortef 7/10 off pressors, weaning stress steroids  7/11 Pressors added back on again 7/12 ID consult due to rising WBC, hypothermia, hypotension concern for worsening infection. Vancomycin added. Repeat blood cx collected. 7/15 CRRT stopped   7/17 Restarted on low dose levo. No evidence of new infection. Held off on antibiotics. Nephrology contacted family and decision was made to continue iHD this week to allow additional time for renal recovery. Patient is not a long term iHD candidate  7/18 plans for iHD, tolerated with 2L removed  7/19 slight drop in hgb to 6.9 will transfuse 1 unit PRBC  7/20 To TR[facility], PCCM following for trach / vent needs  Interim History / Subjective:  Vent - 30% / PEEP 5  Afebrile / WBC 16.6 I/O - last HD 7/22 with 4.5 UF removed, +751m in last 24 hours   Objective   Blood pressure 117/74, pulse 72, temperature 98.1 F (36.7 C), temperature source Oral, resp. rate (!) 30, height '5\' 6"'$  (1.676 m), weight 76 kg, SpO2 100 %.    Vent Mode: PRVC FiO2 (%):  [40 %] 40 % Set Rate:  [30 bmp] 30 bmp Vt Set:  [380 mL] 380 mL PEEP:  [5 cmH20] 5 cmH20 Plateau Pressure:  [23 cmH20-27 cmH20] 27 cmH20   Intake/Output Summary (Last 24 hours) at 08/09/2020 0958 Last data filed at 08/09/2020 [id number] per 24 hour  Intake 915 ml  Output 325 ml  Net 590 ml   Filed Weights   08/07/20 1715 08/08/20 0500 08/09/20 0500  Weight: 78.9 kg 75.3 kg 76 kg    Physical Exam: General:  chronically ill appearing adult male lying in bed on vent in NAD HEENT: MM pink/moist, anicteric, #6 proximal XLT trach midline with few thin clear/white secretions Neuro: eyes opened, opens to voice, grinding teeth, quad CV: s1s2 RRR, no m/r/g PULM:  non-labored on vent, coarse rhonchi bilaterally GI:  soft, bsx4 active  Extremities: warm/dry, generalized edema, extremity changes consistent with chronic bed bound status    Resolved Hospital Problem list   Hyperkalemia Tachycardia Hypotension Thrombocytosis  AKI Septic shock 2nd to HCAP and UTI all present on admission -Acinetobacter in sputum culture from 07/12/20. Completed 14 days of Unasyn for -Of pressors on 4/14. Restarted  levo 7/17.  Assessment & Plan:    Acute on chronic  hypoxic and hypercarbic respiratory failure secondary pna and volume overload Tracheostomy and ventilator dependent status. -PRVC 8cc/kg as rest mode -has #6 proximial XLT trach  -trach care per protocol  -VAP prevention measures  -follow intermittent CXR  -low dose fentanyl PRN for pain / sedation  -no further plans for wean   Goals of Care DNR in event of cardiac arrest -per [facility] -Palliative care following    PCCM will continue to follow for trach / vent needs 1-2x per week.  Please call if new concerns arise.   Signature:    Nickolas Madrid, NP Pulmonary/Critical Care Medicine  08/09/2020  9:58 AM

## 2020-08-09 NOTE — Progress Notes (Signed)
ANTICOAGULATION CONSULT NOTE - Initial Consult  Pharmacy Consult for Apixaban Indication: atrial fibrillation  Allergies  Allergen Reactions   Adhesive [Tape] Rash    Patient Measurements: Height: '5\' 6"'$  (167.6 cm) Weight: 76 kg (167 lb 8.8 oz) IBW/kg (Calculated) : 63.8   Vital Signs: Temp: 97.5 F (36.4 C) (07/28 1112) Temp Source: Oral (07/28 1112) BP: 139/87 (07/28 1200) Pulse Rate: 68 (07/28 1200)  Labs: Recent Labs    08/07/20 0906 08/08/20 0220 08/08/20 0431 08/09/20 0022  HGB  --   --  8.6* 8.7*  HCT  --   --  27.0* 26.6*  PLT  --   --  315 304  CREATININE 1.53* 1.01  --  1.33*    Estimated Creatinine Clearance: 46.6 mL/min (A) (by C-G formula based on SCr of 1.33 mg/dL (H)).   Medical History: Past Medical History:  Diagnosis Date   Anemia    Anxiety    Chronic respiratory failure (HCC)    DM2 (diabetes mellitus, type 2) (HCC)    Functional quadriplegia (HCC)    Hypertension    Multiple drug resistant organism (MDRO) culture positive    Urine retention    UTI (urinary tract infection)    Assessment: 70 y.o. male from [facility] with hx of C3-C5 compressive myelopathy with functional quadriplegia and chronic trach/vent presented secondary to altered mental status and hypotension with recurrent pneumonia.  Patient with atrial fibrillation. Pharmacy consulted to start apixaban.   Plan:  Start apixaban 5 mg po bid Monitor for signs and symptoms of bleeding  Alanda Slim, PharmD, [facility] Clinical Pharmacist Please see AMION for all Pharmacists' Contact Phone Numbers 08/09/2020, 1:25 PM

## 2020-08-09 NOTE — Discharge Instructions (Addendum)
Information on my medicine - ELIQUIS (apixaban)  This medication education was reviewed with me or my healthcare representative as part of my discharge preparation.  Why was Eliquis prescribed for you? Eliquis was prescribed for you to reduce the risk of a blood clot forming that can cause a stroke if you have a medical condition called atrial fibrillation (a type of irregular heartbeat).  What do You need to know about Eliquis ? Take your Eliquis TWICE DAILY - one tablet in the morning and one tablet in the evening with or without food. If you have difficulty swallowing the tablet whole please discuss with your pharmacist how to take the medication safely.  Take Eliquis exactly as prescribed by your doctor and DO NOT stop taking Eliquis without talking to the doctor who prescribed the medication.  Stopping may increase your risk of developing a stroke.  Refill your prescription before you run out.  After discharge, you should have regular check-up appointments with your healthcare provider that is prescribing your Eliquis.  In the future your dose may need to be changed if your kidney function or weight changes by a significant amount or as you get older.  What do you do if you miss a dose? If you miss a dose, take it as soon as you remember on the same day and resume taking twice daily.  Do not take more than one dose of ELIQUIS at the same time to make up a missed dose.  Important Safety Information A possible side effect of Eliquis is bleeding. You should call your healthcare provider right away if you experience any of the following: Bleeding from an injury or your nose that does not stop. Unusual colored urine (red or dark brown) or unusual colored stools (red or black). Unusual bruising for unknown reasons. A serious fall or if you hit your head (even if there is no bleeding).  Some medicines may interact with Eliquis and might increase your risk of bleeding or clotting while  on Eliquis. To help avoid this, consult your healthcare provider or pharmacist prior to using any new prescription or non-prescription medications, including herbals, vitamins, non-steroidal anti-inflammatory drugs (NSAIDs) and supplements.  This website has more information on Eliquis (apixaban): http://www.eliquis.com/eliquis/home     [facility]  Discharge Instructions  AV Fistula or Graft Surgery for Dialysis Access  Please refer to the following instructions for your post-procedure care. Your surgeon or physician assistant will discuss any changes with you.  Activity  You may drive the day following your surgery, if you are comfortable and no longer taking prescription pain medication. Resume full activity as the soreness in your incision resolves.  Bathing/Showering  You may shower after you go home. Keep your incision dry for 48 hours. Do not soak in a bathtub, hot tub, or swim until the incision heals completely. You may not shower if you have a hemodialysis catheter.  Incision Care  Clean your incision with mild soap and water after 48 hours. Pat the area dry with a clean towel. You do not need a bandage unless otherwise instructed. Do not apply any ointments or creams to your incision. You may have skin glue on your incision. Do not peel it off. It will come off on its own in about one week. Your arm may swell a bit after surgery. To reduce swelling use pillows to elevate your arm so it is above your heart. Your doctor will tell you if you need to lightly  wrap your arm with an ACE bandage.  Diet  Resume your normal diet. There are not special food restrictions following this procedure. In order to heal from your surgery, it is CRITICAL to get adequate nutrition. Your body requires vitamins, minerals, and protein. Vegetables are the best source of vitamins and minerals. Vegetables also provide the perfect balance of protein. Processed food has  little nutritional value, so try to avoid this.  Medications  Resume taking all of your medications. If your incision is causing pain, you may take over-the counter pain relievers such as acetaminophen (Tylenol). If you were prescribed a stronger pain medication, please be aware these medications can cause nausea and constipation. Prevent nausea by taking the medication with a snack or meal. Avoid constipation by drinking plenty of fluids and eating foods with high amount of fiber, such as fruits, vegetables, and grains.  Do not take Tylenol if you are taking prescription pain medications.  Follow up Your surgeon may want to see you in the office following your access surgery. If so, this will be arranged at the time of your surgery.  Please call us immediately for any of the following conditions:  Increased pain, redness, drainage (pus) from your incision site Fever of 101 degrees or higher Severe or worsening pain at your incision site Hand pain or numbness.  Reduce your risk of vascular disease:  Stop smoking. If you would like help, call [facility] at 1-800-QUIT-NOW 775-789-3871) or [place] at [place] your cholesterol Maintain a desired weight Control your diabetes Keep your blood pressure down  Dialysis  It will take several weeks to several months for your new dialysis access to be ready for use. Your surgeon will determine when it is okay to use it. Your nephrologist will continue to direct your dialysis. You can continue to use your Permcath until your new access is ready for use.   08/13/2020 SAVVAS SHAMBERGER [id number] 1950/02/05  Surgeon(s): Cherre Robins, MD  Procedure(s): Insertion of left forearm loop AVG  x Do not stick graft for 4 weeks    If you have any questions, please call the office at [phone].

## 2020-08-09 NOTE — Progress Notes (Signed)
[facility] Progress Note     Assessment/ Plan:   AKI -baseline Cr 0.4. AKI due to ATN /shock. CRRT initiated on 7/6 for anasarca with ineffective diuresis and uremia.  CCM for rij temp line placement 64/33- 22 days old - CRRT 7/6 - 7/15, now off due to improved hemodynamics -Not a good long term candidate for HD due to poor functional status, vent dependency. Dr. Candiss Norse voiced this concern to both son and wife over the phone on 7/6 and they wished to proceed with RRT.  Appreciate palliative care's assistance.  Dr. Johnney Ou discussed concern for long term dialysis to wife and son 7/12 and again on 7/17.  The plan was continue iHD through this past week given he's out of shock phase but if no signs of recovery then reconvene.  He already had been living at [place] for several years prior to this admission.  Palliative care has been meeting with family daily and family not willing to stop dialysis at this point.     Had iHD early Sat AM; last HD 7/26 UF net 3L tolerated. Next treatment planned for today  on TTS schedule.  Appreciate VIR converting to  tunneled HD cath 7/26. 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 give iron load as severely iron deficient on 6/30 labs (t sat 5%) - last given blood on 7/19. on ESA    Subjective:   No changes overnight. Afebrile overnight.   Objective:   BP 109/63    Pulse 72   Temp 98.1 F (36.7 C) (Oral)   Resp (!) 30   Ht _0  (1.676 m)   Wt 76 kg   SpO2 100%   BMI 27.04 kg/m   Intake/Output Summary (Last 24 hours) at 08/09/2020 0859 Last data filed at 08/08/2020 1806 Gross per 24 hour  Intake 810 ml  Output 325 ml  Net 485 ml   Weight change: -4.9 kg  Physical Exam: AGT:XMIWOEHOZYY ill appearing-  eyes open-  seems to understand what I am saying CVS:s1s2, rrr Resp:trached, bl chest expansion, on full vent support QMG:NOIB Ext:  2+ diffuse edema but better Neuro: functional quadriplegic, opening eyes, looking around  Imaging: IR Fluoro Guide CV Line Right  Result Date: 08/07/2020 [facility]iology Procedure Note Patient: Luis Orr DOB: 04-14-1950 Medical Record Number: [id number] Note Date/Time: 08/07/20 10:52 AM Performing Physician: Michaelle Birks, MD Assistant(s): None Diagnosis: ESRD requiring Hemodialysis Procedure: TUNNELED HEMODIALYSIS CATHETER PLACEMENT Anesthesia: Conscious Sedation Complications: None Estimated Blood Loss: Minimal Specimens:  None Findings: Successful placement of right-sided, 23 cm (tip-to-cuff), tunneled hemodialysis catheter with the tip of the catheter in the proximal right atrium. Plan: Catheter ready for use. See detailed procedure note with images in PACS. The patient tolerated the procedure well without incident or complication and was returned to ICU in stable condition. Michaelle Birks, MD [facility] Radiology  IR Fluoro Guide CV Line Right  Result Date: 08/07/2020 Vascular and Interventional Radiology Procedure Note Patient: Luis Orr DOB: 26-Jul-1950 Medical Record Number: [id number] Note Date/Time: 08/07/20 10:52 AM Performing Physician: Michaelle Birks, MD Assistant(s): None Diagnosis: ESRD requiring Hemodialysis Procedure: TUNNELED HEMODIALYSIS CATHETER PLACEMENT Anesthesia: Conscious Sedation Complications: None Estimated Blood  Loss: Minimal Specimens:  None Findings: Successful placement of right-sided, 23 cm (tip-to-cuff), tunneled hemodialysis catheter with the tip of the catheter in the proximal right atrium. Plan: Catheter ready for use. See detailed procedure note with images in PACS. The patient tolerated the procedure well without incident or complication and was returned to ICU in stable condition. Michaelle Birks, MD [facility] Radiology   IR US Guide Vasc Access Right  Result Date: 08/07/2020 Vascular and Interventional Radiology Procedure Note Patient: Luis Orr DOB: 11-24-1950 Medical Record Number: [id number] Note Date/Time: 08/07/20 10:52 AM Performing Physician: Michaelle Birks, MD Assistant(s): None Diagnosis: ESRD requiring Hemodialysis Procedure: TUNNELED HEMODIALYSIS CATHETER PLACEMENT Anesthesia: Conscious Sedation Complications: None Estimated Blood Loss: Minimal Specimens:  None Findings: Successful placement of right-sided, 23 cm (tip-to-cuff), tunneled hemodialysis catheter with the tip of the catheter in the proximal right atrium. Plan: Catheter ready for use. See detailed procedure note with images in PACS. The patient tolerated the procedure well without incident or complication and was returned to ICU in stable condition. Michaelle Birks, MD [facility] Radiology   DG CHEST PORT 1 VIEW  Result Date: 08/09/2020 CLINICAL DATA:  Acute respiratory failure.  Hypoxia. EXAM: PORTABLE CHEST 1 VIEW COMPARISON:  07/27/2020. FINDINGS: Interval removal of right IJ line and placement of dual-lumen catheter. Dual lumen catheter tip at cavoatrial junction. Tracheostomy tube in stable position. Heart size normal. Progressive bibasilar pulmonary infiltrates/edema. Small bilateral pleural effusions again noted. No pneumothorax. IMPRESSION: 1. Interim removal of right IJ line and placement of dual-lumen catheter. Dual-lumen  catheter tip at cavoatrial junction. Tracheostomy tube in stable position. 2. Progressive bibasilar pulmonary infiltrates/edema. Small bilateral pleural effusions again noted. Electronically Signed   By: Marcello Moores  Register   On: 08/09/2020 06:03    Labs: BMET Recent Labs  Lab 08/03/20 4287 08/04/20 6811 08/05/20 0352 08/06/20 0341 08/07/20 0906 08/08/20 0220 08/09/20 0022  NA 136 134* 133* 130* 134* 134* 133*  K 3.0* 4.9 4.1 4.3 4.3 4.0 4.0  CL 98 94* 97* 93* 94* 98 95*  CO2 _0 GLUCOSE 117* 139* 142* 127* 114* 171* 152*  BUN 72* 98* 74* 103* 139* 72* 120*  CREATININE 0.88 1.12 0.85 1.17 1.53* 1.01 1.33*  CALCIUM 8.4* 8.9 8.7* 8.8* 8.8* 8.6* 9.1  PHOS 3.9 4.9* 4.7* 6.2* 7.1* 4.9* 5.5*   CBC Recent Labs  Lab 08/05/20 0352 08/06/20 0341 08/08/20 0431 08/09/20 0022  WBC 18.5* 16.6* 18.3* 14.0*  NEUTROABS  --   --   --  11.6*  HGB 8.1* 8.3* 8.6* 8.7*  HCT 24.9* 25.5* 27.0* 26.6*  MCV 81.1 81.5 83.3 82.9  PLT PLATELET CLUMPS NOTED ON SMEAR, UNABLE TO ESTIMATE PLATELET CLUMPS NOTED ON SMEAR, UNABLE TO ESTIMATE [phone]    Medications:     amiodarone  200 mg Per Tube Daily   buPROPion  75 mg Per Tube BID   chlorhexidine gluconate (MEDLINE KIT)  15 mL Mouth Rinse BID   Chlorhexidine Gluconate Cloth  6 each Topical Q0600   darbepoetin (ARANESP) injection - DIALYSIS  150 mcg Intravenous Q Wed-HD   docusate  100 mg Per Tube BID   feeding supplement (PROSource TF)  45 mL Per Tube TID   guaiFENesin  5 mL Per Tube BID   heparin injection (subcutaneous)  5,000 Units Subcutaneous Q8H   insulin aspart  2 Units Subcutaneous Q4H   insulin glargine  10 Units Subcutaneous Daily   mouth rinse  15 mL Mouth Rinse 10 times per day   midodrine  10 mg Per Tube TID   multivitamin  1 tablet Per Tube QHS   nutrition supplement (JUVEN)  1 packet Per Tube BID BM   pantoprazole sodium  40 mg Per Tube QHS   polyethylene glycol  17 g Per Tube Daily   sertraline  100 mg Per Tube  Daily   sodium chloride flush  10-40 mL Intracatheter Q12H      [name], MD 08/09/2020, 8:59 AM

## 2020-08-10 DIAGNOSIS — A419 Sepsis, unspecified organism: Secondary | ICD-10-CM | POA: Diagnosis not present

## 2020-08-10 DIAGNOSIS — Z7189 Other specified counseling: Secondary | ICD-10-CM | POA: Diagnosis not present

## 2020-08-10 DIAGNOSIS — G8250 Quadriplegia, unspecified: Secondary | ICD-10-CM | POA: Diagnosis not present

## 2020-08-10 DIAGNOSIS — N186 End stage renal disease: Secondary | ICD-10-CM | POA: Diagnosis not present

## 2020-08-10 DIAGNOSIS — N179 Acute kidney failure, unspecified: Secondary | ICD-10-CM | POA: Diagnosis not present

## 2020-08-10 DIAGNOSIS — J9601 Acute respiratory failure with hypoxia: Secondary | ICD-10-CM | POA: Diagnosis not present

## 2020-08-10 LAB — MAGNESIUM: Magnesium: 2 mg/dL (ref 1.7–2.4)

## 2020-08-10 LAB — RENAL FUNCTION PANEL
Albumin: 2.8 g/dL — ABNORMAL LOW (ref 3.5–5.0)
Anion gap: 10 (ref 5–15)
BUN: 72 mg/dL — ABNORMAL HIGH (ref 8–23)
CO2: 27 mmol/L (ref 22–32)
Calcium: 9.5 mg/dL (ref 8.9–10.3)
Chloride: 97 mmol/L — ABNORMAL LOW (ref 98–111)
Creatinine, Ser: 0.88 mg/dL (ref 0.61–1.24)
GFR, Estimated: >60 mL/min (ref >60)
Glucose, Bld: 221 mg/dL — ABNORMAL HIGH (ref 70–99)
Phosphorus: 3.4 mg/dL (ref 2.5–4.6)
Potassium: 3 mmol/L — ABNORMAL LOW (ref 3.5–5.1)
Sodium: 134 mmol/L — ABNORMAL LOW (ref 135–145)

## 2020-08-10 LAB — GLUCOSE, CAPILLARY
Glucose-Capillary: 166 mg/dL — ABNORMAL HIGH (ref 70–99)
Glucose-Capillary: 164 mg/dL — ABNORMAL HIGH (ref 70–99)
Glucose-Capillary: 161 mg/dL — ABNORMAL HIGH (ref 70–99)
Glucose-Capillary: 178 mg/dL — ABNORMAL HIGH (ref 70–99)
Glucose-Capillary: 131 mg/dL — ABNORMAL HIGH (ref 70–99)
Glucose-Capillary: 150 mg/dL — ABNORMAL HIGH (ref 70–99)

## 2020-08-10 MED ORDER — CHLORHEXIDINE GLUCONATE CLOTH 2 % EX PADS
6.0000 | MEDICATED_PAD | Freq: Every day | CUTANEOUS | Status: DC
  Administered 2020-08-11 – 2020-08-26 (×16): 6 via TOPICAL

## 2020-08-10 MED ORDER — POTASSIUM CHLORIDE 10 MEQ/100ML IV SOLN: 10.0000 meq | INTRAVENOUS | Status: DC

## 2020-08-10 MED ORDER — POTASSIUM CHLORIDE 20 MEQ PO PACK: 20.0000 meq | PACK | ORAL | Status: DC

## 2020-08-10 NOTE — Progress Notes (Signed)
Patient ID: Luis Orr, male   DOB: [birth year], 70 y.o.   MRN: [id number]  PROGRESS NOTE    Luis Orr  MWN:[id number] DOB: [birth year] DOA: 06/25/2020 PCP: Townsend Roger, MD   Brief Narrative:  70 y.o. male from [facility] with hx of C3-C5 compressive myelopathy with functional quadriplegia and chronic trach/vent presented secondary to altered mental status and hypotension with recurrent pneumonia requiring vasopressor support and ICU admission.  ICU significant events: 6/29 admitted to ICU for septic shock on levo to maintain MAP>65, continued on vent support 6/29 Blood Cx>> staph species in 1 of 4 cultures drawn>> suspect contaminant 6/30 Off pressors 7/2 ID Consult for MDR acinetobacter in trach asp, Meropenem changed to unasyn 7/2 PRBC transfusion, iron replacement 7/5 Pressors added again. Central line placed. Drainage from gastrostomy tube >> CT Abd without fluid collection around gastrostomy tube but with diffuse body wall edema. Echo EF 50-55%, mild LVH 7/6 Renal Replacement Therapy ; Code status changed to partial Code (DNR) 7/7 Transfuse 1U PRBC 7/9 add solu cortef 7/10 off pressors, weaning stress steroids 7/11 Pressors added back on again 7/12 ID consult due to rising WBC, hypothermia, hypotension concern for worsening infection. Vancomycin added. Repeat blood cx collected. 7/15 CRRT stopped 7/17 Restarted on low dose levo. No evidence of new infection. Held off on antibiotics. Nephrology contacted family and decision was made to continue iHD this week to allow additional time for renal recovery. Patient is not a long term iHD candidate 7/18 plans for iHD, tolerated with 2L removed 7/19 slight drop in hgb to 6.9 will transfuse 1 unit PRBC 7/22>7/26: Tolerating HD. TOC consulted for placement. Will likely need out of state placement.   He was subsequently transferred to [facility] service.  Assessment & Plan:   Acute on chronic hypoxic and hypercapnic respiratory  failure -Secondary to pneumonia/volume overload. Now resolved and back to baseline. Currently on ventilator support per PCCM and is at baseline.  PCCM following intermittently  Ventilator associated pneumonia -Present on admission. Sputum culture significant for acinetobacter baumannii. -Patient was treated with Unasyn x14 days. Completed course.   Leukocytosis -Secondary to above -Improving; monitor  Volume overload -Secondary to renal failure with anuria. Patient has improved with CRRT and now on intermittent hemodialysis.  Still with anasarca. Patient will continue hemodialysis   AKI with oliguria -Now appears patient is anuric.  -Nephrology following: Was initially on CRRT and currently on intermittent HD.  Issues with HD are that patient cannot go back to his SNF. Palliative care has been consulted and are following. [place] placed on 08/07/2020 -Patient is a poor candidate for long-term dialysis but family wants to continue intermittent dialysis for now.   Septic shock -Present on admission. Secondary to pneumonia. Patient required vasopressor support which is now weaned off. Resolved. Initial blood culture significant for likely contaminant. Repeat blood cultures negative.   Acute metabolic encephalopathy -Secondary to acute illness. CT head negative for acute process. Appears improved and likely back at baseline.   Diabetes mellitus, type 2 with hyperglycemia -Insulin needs have decreased. 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 Improving.   Quadriplegia -Secondary to compressive myelopathy. Patient resides at [facility] but per chart review, [facility] will not accept him back if he is HD dependent. -Education officer, museum following   Microcytic anemia Acute anemia Patient required 4 units of PRBC during this hospitalization.  Thought to be  secondary to a combination of acute  illness, chronic disease and iron deficiency. IV iron given.  Hemoglobin currently stable.   Severe malnutrition -Continue tube feeds   Paroxysmal atrial fibrillation -Continue amiodarone   Pressure injury : Medial/posterior sacrum present on admission. Right/posterior/lateral ankle unknown if present on admission. Left ear, not present on admission.  -Continue local wound care  Generalized deconditioning -Overall prognosis is very poor.  Family wants to continue current treatment including trach/vent and intermittent hemodialysis.   DVT prophylaxis: Heparin Code Status:   Code Status: Partial Code Family Communication: None at bedside Disposition Plan: Status is: Inpatient  Remains inpatient appropriate because:Inpatient level of care appropriate due to severity of illness  Dispo: The patient is from:  [facility]              Anticipated d/c is to: SNF              Patient currently is not medically stable to d/c.   Difficult to place patient Yes  Consultants:  PCCM Nephrology Infectious disease Palliative care medicine  Procedures: CRRT followed by intermittent hemodialysis.  [place] placed on 08/07/2020  Antimicrobials:  Anti-infectives (From admission, onward)    Start     Dose/Rate Route Frequency Ordered Stop   08/07/20 1012  ceFAZolin (ANCEF) IVPB 2g/100 mL premix        over 30 Minutes Intravenous Continuous PRN 08/07/20 1023 08/07/20 1012   08/07/20 0000  ceFAZolin (ANCEF) IVPB 2g/100 mL premix        2 g 200 mL/hr over 30 Minutes Intravenous To Radiology 08/05/20 1244 08/07/20 0013   08/06/20 0000  ceFAZolin (ANCEF) IVPB 2g/100 mL premix  Status:  Discontinued        2 g 200 mL/hr over 30 Minutes Intravenous To Radiology 08/05/20 1201 08/05/20 1244   07/25/20 1700  vancomycin (VANCOCIN) IVPB 1000 mg/200 mL premix  Status:  Discontinued        1,000 mg 200 mL/hr over 60 Minutes Intravenous Every 24 hours 07/24/20 1606 07/27/20 0815   07/24/20 1700  vancomycin  (VANCOREADY) IVPB 1750 mg/350 mL        1,750 mg 175 mL/hr over 120 Minutes Intravenous  Once 07/24/20 1606 07/24/20 2005   07/19/20 1000  Ampicillin-Sulbactam (UNASYN) 3 g in sodium chloride 0.9 % 100 mL IVPB        3 g 200 mL/hr over 30 Minutes Intravenous Every 6 hours 07/19/20 0848 07/28/20 0342   07/18/20 1200  Ampicillin-Sulbactam (UNASYN) 3 g in sodium chloride 0.9 % 100 mL IVPB  Status:  Discontinued        3 g 200 mL/hr over 30 Minutes Intravenous Every 8 hours 07/18/20 0720 07/19/20 0848   07/14/20 1530  Ampicillin-Sulbactam (UNASYN) 3 g in sodium chloride 0.9 % 100 mL IVPB  Status:  Discontinued        3 g 200 mL/hr over 30 Minutes Intravenous Every 6 hours 07/14/20 1440 07/18/20 0720   07/13/20 2000  meropenem (MERREM) 2 g in sodium chloride 0.9 % 100 mL IVPB  Status:  Discontinued        2 g 200 mL/hr over 30 Minutes Intravenous Every 12 hours 07/13/20 1433 07/14/20 1440   07/13/20 1432  vancomycin variable dose per unstable renal function (pharmacist dosing)  Status:  Discontinued         Does not apply See admin instructions 07/13/20 1433 07/15/20 0937   07/13/20 1400  vancomycin (VANCOCIN) IVPB 750 mg/150 ml premix  Status:  Discontinued        750 mg 150 mL/hr over 60 Minutes Intravenous Every 12 hours 07/13/20 1321 07/13/20 1430   07/12/20 1248  vancomycin (VANCOREADY) IVPB 750 mg/150 mL  Status:  Discontinued        750 mg 150 mL/hr over 60 Minutes Intravenous Every 12 hours 07/12/20 1248 07/13/20 1321   06/22/2020 2300  meropenem (MERREM) 2 g in sodium chloride 0.9 % 100 mL IVPB  Status:  Discontinued        2 g 200 mL/hr over 30 Minutes Intravenous Every 8 hours 07/01/2020 2209 07/13/20 1433   06/16/2020 2205  vancomycin variable dose per unstable renal function (pharmacist dosing)  Status:  Discontinued         Does not apply See admin instructions 07/06/2020 2209 07/12/20 1248   07/05/2020 1845  vancomycin (VANCOREADY) IVPB 1500 mg/300 mL        1,500 mg 150 mL/hr over 120  Minutes Intravenous  Once 06/30/2020 1843 06/24/2020 2132   06/24/2020 1845  ceFEPIme (MAXIPIME) 2 g in sodium chloride 0.9 % 100 mL IVPB        2 g 200 mL/hr over 30 Minutes Intravenous  Once 06/23/2020 1843 07/02/2020 1952        Subjective: Patient seen and examined at bedside.  No overnight fever, vomiting or seizures reported Objective: Vitals:   08/10/20 0400 08/10/20 0411 08/10/20 0500 08/10/20 0600  BP: (!) 93/59  134/81   Pulse: 70  80 84  Resp: (!) 30  (!) 30 (!) 30  Temp:  98.3 F (36.8 C)    TempSrc:  Oral    SpO2: 99%  98% 95%  Weight:   74.1 kg   Height:        Intake/Output Summary (Last 24 hours) at 08/10/2020 0723 Last data filed at 08/10/2020 0600 Gross per 24 hour  Intake 490 ml  Output 2750 ml  Net -2260 ml    Filed Weights   08/09/20 0500 08/09/20 1340 08/10/20 0500  Weight: 76 kg 79 kg 74.1 kg    Examination:  General exam: No distress.  Appears chronically ill and deconditioned.  ENT: On vent via tracheostomy Respiratory system: Bilateral decreased breath sounds at bases with scattered crackles cardiovascular system: S1-S2 heard, rate controlled Gastrointestinal system: Abdomen is distended mildly, soft and nontender.  Normal bowel sounds heard.  PEG tube present. Extremities: No cyanosis; mild lower extremity edema present  Central nervous system: Wakes up only very slightly. quadriplegia present.   Skin: No obvious ecchymosis/lesions  psychiatry: Could not be assessed because of mental status   Data Reviewed: I have personally reviewed following labs and imaging studies  CBC: Recent Labs  Lab 08/04/20 1408 08/05/20 0352 08/06/20 0341 08/08/20 0431 08/09/20 0022  WBC 19.2* 18.5* 16.6* 18.3* 14.0*  NEUTROABS  --   --   --   --  11.6*  HGB 8.7* 8.1* 8.3* 8.6* 8.7*  HCT 26.8* 24.9* 25.5* 27.0* 26.6*  MCV 82.2 81.1 81.5 83.3 82.9  PLT PLATELET CLUMPS NOTED ON SMEAR, UNABLE TO ESTIMATE PLATELET CLUMPS NOTED ON SMEAR, UNABLE TO ESTIMATE PLATELET  CLUMPS NOTED ON SMEAR, UNABLE TO ESTIMATE 315 115    Basic Metabolic Panel: Recent Labs  Lab 08/06/20 0341 08/07/20 0906 08/08/20 0220 08/09/20 0022 08/10/20 0111  NA 130* 134* 134* 133* 134*  K 4.3 4.3 4.0 4.0 3.0*  CL 93* 94* 98 95* 97*  CO2 24 25 22 24 27   GLUCOSE 127* 114* 171* 152*  221*  BUN 103* 139* 72* 120* 72*  CREATININE 1.17 1.53* 1.01 1.33* 0.88  CALCIUM 8.8* 8.8* 8.6* 9.1 9.5  MG 2.1 2.3 2.1 2.1 2.0  PHOS 6.2* 7.1* 4.9* 5.5* 3.4    GFR: Estimated Creatinine Clearance: 70.5 mL/min (by C-G formula based on SCr of 0.88 mg/dL). Liver Function Tests: Recent Labs  Lab 08/06/20 0341 08/07/20 0906 08/08/20 0220 08/09/20 0022 08/10/20 0111  ALBUMIN 2.3* 2.3* 2.3* 2.3* 2.8*    No results for input(s): LIPASE, AMYLASE in the last 168 hours. No results for input(s): AMMONIA in the last 168 hours. Coagulation Profile: No results for input(s): INR, PROTIME in the last 168 hours. Cardiac Enzymes: No results for input(s): CKTOTAL, CKMB, CKMBINDEX, TROPONINI in the last 168 hours. BNP (last 3 results) No results for input(s): PROBNP in the last 8760 hours. HbA1C: No results for input(s): HGBA1C in the last 72 hours. CBG: Recent Labs  Lab 08/09/20 1105 08/09/20 1542 08/09/20 1930 08/09/20 2353 08/10/20 0409  GLUCAP 150* 156* 207* 227* 166*    Lipid Profile: No results for input(s): CHOL, HDL, LDLCALC, TRIG, CHOLHDL, LDLDIRECT in the last 72 hours. Thyroid Function Tests: No results for input(s): TSH, T4TOTAL, FREET4, T3FREE, THYROIDAB in the last 72 hours. Anemia Panel: No results for input(s): VITAMINB12, FOLATE, FERRITIN, TIBC, IRON, RETICCTPCT in the last 72 hours. Sepsis Labs: No results for input(s): PROCALCITON, LATICACIDVEN in the last 168 hours.  No results found for this or any previous visit (from the past 240 hour(s)).       Radiology Studies: DG CHEST PORT 1 VIEW  Result Date: 08/09/2020 CLINICAL DATA:  Acute respiratory failure.   Hypoxia. EXAM: PORTABLE CHEST 1 VIEW COMPARISON:  07/27/2020. FINDINGS: Interval removal of right IJ line and placement of dual-lumen catheter. Dual lumen catheter tip at cavoatrial junction. Tracheostomy tube in stable position. Heart size normal. Progressive bibasilar pulmonary infiltrates/edema. Small bilateral pleural effusions again noted. No pneumothorax. IMPRESSION: 1. Interim removal of right IJ line and placement of dual-lumen catheter. Dual-lumen catheter tip at cavoatrial junction. Tracheostomy tube in stable position. 2. Progressive bibasilar pulmonary infiltrates/edema. Small bilateral pleural effusions again noted. Electronically Signed   By: Marcello Moores  Register   On: 08/09/2020 06:03        Scheduled Meds:  amiodarone  200 mg Per Tube Daily   apixaban  5 mg Per Tube BID   buPROPion  75 mg Per Tube BID   chlorhexidine gluconate (MEDLINE KIT)  15 mL Mouth Rinse BID   Chlorhexidine Gluconate Cloth  6 each Topical Q0600   darbepoetin (ARANESP) injection - DIALYSIS  150 mcg Intravenous Q Wed-HD   docusate  100 mg Per Tube BID   feeding supplement (PROSource TF)  45 mL Per Tube TID   guaiFENesin  5 mL Per Tube BID   insulin aspart  2 Units Subcutaneous Q4H   insulin glargine  10 Units Subcutaneous Daily   mouth rinse  15 mL Mouth Rinse 10 times per day   midodrine  10 mg Per Tube TID   multivitamin  1 tablet Per Tube QHS   nutrition supplement (JUVEN)  1 packet Per Tube BID BM   pantoprazole sodium  40 mg Per Tube QHS   polyethylene glycol  17 g Per Tube Daily   sertraline  100 mg Per Tube Daily   sodium chloride flush  10-40 mL Intracatheter Q12H   Continuous Infusions:  sodium chloride Stopped (07/26/20 0824)   sodium chloride 250 mL (07/31/20 1428)  sodium chloride     sodium chloride     feeding supplement (NEPRO CARB STEADY) 1,000 mL (08/09/20 1405)          Aline August, MD [facility] 08/10/2020, 7:23 AM

## 2020-08-10 NOTE — Progress Notes (Signed)
Daily Progress Note   Patient Name: Luis Orr       Date: 08/10/2020 DOB: October 16, 1950  Age: 70 y.o. MRN#: [id number] Attending Physician: Aline August, MD Primary Care Physician: Townsend Roger, MD Admit Date: 07/01/2020  Reason for Consultation/Follow-up: Establishing goals of care  Subjective: Chart Reviewed. Assessed patient at the bedside. He opens his eyes to voice.  Held a family meeting via conference call with patient' wife June and June's two cousins, Mechele Claude and Horris Latino. June shares that after speaking with her son, she has made a definitive decision to proceed with transferring patient out of state for ongoing aggressive care.   They have reviewed [name] at length. We discussed the family's priorities and concerns, such as whether the facility has video call capabilities, overnight visitation, or an assistance program for accommodations when June is visiting. Discussed the family's strong desire for a consistent contact, such as social work, available at the facility for updates and Personnel officer. They would be open to continuing palliative medicine involvement in available.  Questions and concerns addressed. PMT will continue to support holistically.   Length of Stay: 30 days  Vital Signs: BP 105/65   Pulse 79   Temp 98.5 F (36.9 C) (Oral)   Resp (!) 30   Ht '5\' 6"'$  (1.676 m)   Wt 74.1 kg   SpO2 99%   BMI 26.37 kg/m  SpO2: SpO2: 99 % O2 Device: O2 Device: Ventilator O2 Flow Rate: O2 Flow Rate (L/min): 50 L/min  Physical Exam: NAD, chronically ill appearing M, opens eyes spontaneously Remains on vent support via trach Tachypneic  Regular heart rate, irregular rhythm   Flowsheet Rows    Flowsheet Row Most Recent Value  Intake Tab   Referral Department Critical care  Unit at Time of Referral ICU  Palliative Care Primary Diagnosis Nephrology  Date Notified 07/17/20  Palliative Care Type New Palliative care  Reason for referral Clarify Goals of  Care  Date of Admission 07/08/2020  Date first seen by Palliative Care 07/18/20  # of days Palliative referral response time 1 Day(s)  # of days IP prior to Palliative referral 6  Clinical Assessment   Psychosocial & Spiritual Assessment   Palliative Care Outcomes        Palliative Care Assessment & Plan  HPI: Palliative Care consult requested for goals of care discussion in this 70 y.o. male  with past medical history of functional quadriplegic d/t compressive C3-C5 myelopathy, trach and vent (2018), diabetes, hypertension, anemia, and anxiety. Patient resides at [facility] facility. He was admitted on 06/17/2020 via EMS to ED from [facility] with concerns for hypotension and altered mental status. Per reports at baseline he is alert and oriented x4. Patient was receiving treatment for pneumonia x1 mos. Patient started on IV antibiotics for sepsis in the setting of UTI. Requiring full ventilator support. 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   Code Status: Limited code  Goals of Care/Recommendations: Patient's wife has confirmed definitive decision for continuing aggressive measures and proceeding with placement at out of state facility [facility] in [place]) Psychosocial and emotional support provided PMT will provide ongoing support  Prognosis: Guarded   Discharge Planning:  out of state Vent SNF, would benefit from ongoing palliative care   Thank you for allowing the Palliative Medicine Team to assist in the care of this patient.   Total time:  35 minutes Greater than 50% of this time was spent counseling and coordinating care related to  the above assessment and plan.  Dorthy Cooler, [facility] Palliative Medicine Team Team Phone # [phone]  Pager # [phone]

## 2020-08-10 NOTE — TOC Progression Note (Signed)
Transition of Care [facility]) - Progression Note    Patient Details  Name: RAYDER CREESE MRN: [id number] Date of Birth: Mar 05, 1950  Transition of Care [facility]) CM/SW Contact  Milinda Antis, [place] Phone Number: 08/10/2020, 1:23 PM  Clinical Narrative:    12:02-  CSW spoke with Marzetta Board at [facility], [phone].  CSW informed the facility of the family's interest and inquired about bed availability.  CSW was informed that the facility would not give that information until the facility has reviewed the patient's information after a referral is submitted.  CSW gathered all of the information requested and faxed to the facility at [phone].    CSW called the patient's wife and informed the wife of the above information.  The spouse was receptive and stated that she will be visiting the patient this weekend.  Pending:  bed offer from a Vent SNF that accepts HD patients.      Expected Discharge Plan: [place] Barriers to Discharge: Continued Medical Work up  Expected Discharge Plan and Services Expected Discharge Plan: [place] arrangements for the past 2 months: Post-Acute Facility                                       Social Determinants of Health (SDOH) Interventions    Readmission Risk Interventions No flowsheet data found.

## 2020-08-10 NOTE — Progress Notes (Deleted)
[facility] ADULT ICU REPLACEMENT PROTOCOL   The patient does apply for the [facility] Adult ICU Electrolyte Replacment Protocol based on the criteria listed below:   1.Exclusion criteria: TCTS patients, ECMO patients and Hypothermia Protocol, and   Dialysis patients 2. Is GFR >/= 30 ml/min? Yes.    Patient's GFR today is >60 3. Is SCr </= 2? Yes.   Patient's SCr is 0.88 mg/dL 4. Did SCr increase >/= 0.5 in 24 hours? No. 5.Pt's weight >40kg  No. 6. Abnormal electrolyte(s): protocol  7. Electrolytes replaced per protocol 8.  Call MD STAT for K+ </= 2.5, Phos </= 1, or Mag </= 1 Physician:    Ronda Fairly A 08/10/2020 5:31 AM

## 2020-08-10 NOTE — Progress Notes (Signed)
[facility] Progress Note     Assessment/ Plan:   AKI -baseline Cr 0.4. AKI due to ATN /shock. CRRT initiated on 7/6 for anasarca with ineffective diuresis and uremia.  CCM for rij temp line placement 31/26- 25 days old - CRRT 7/6 - 7/15, now off due to improved hemodynamics -Not a good long term candidate for HD due to poor functional status, vent dependency. Dr. Candiss Norse voiced this concern to both son and wife over the phone on 7/6 and they wished to proceed with RRT.  Appreciate palliative care's assistance.  Dr. Johnney Ou discussed concern for long term dialysis to wife and son 7/12 and again on 7/17.  The plan was continue iHD through this past week given he's out of shock phase but if no signs of recovery then reconvene.  He already had been living at [facility] for several years prior to this admission.  Palliative care has been meeting with family daily and family not willing to stop dialysis at this point.     Had iHD early Sat AM; last HD 7/26 UF net 3L tolerated, HD 7/28 with net UF 2.5L. Next treatment planned for Sat on TTS schedule.  Appreciate VIR converting to  tunneled HD cath 7/26. Will place orders for HD Sat and f/u early Sat AM whether family wants to continue aggressive measures (decision may be made by family today) bec of limited staffing. Family meeting at [facility] for more firm decision; will f/u.   Hyperkalemia shifted and lokelma given PRN   CKD-MBD, hyperphosphatemia -phos had been ok with CRRT, then up, on nepro, off renvela now   Acute metabolic encephalopathy, likely related to sepsis and uremia:  CT head unrevealing, seems to be improving   Septic shock:  resolved -off pressors now -off abx as of 7/15   Chronic respiratory failure s/p trach -vent mgmt per ccm   Quadriplegia 2/2 compressive myelopathy -resides at [facility] PTA -appreciate palliative care's assistance-  [facility] will not take him back if he is dialysis dependent   Microcytic  anemia -transfuse for hgb <7. give iron load as severely iron deficient on 6/30 labs (t sat 5%) - last given blood on 7/19. on ESA    Subjective:   No changes overnight. Afebrile overnight. Pain in the neck region but no discharge or bleeding.   Objective:   BP 105/65   Pulse 79   Temp 98.5 F (36.9 C) (Oral)   Resp (!) 30   Ht _0  (1.676 m)   Wt 74.1 kg   SpO2 99%   BMI 26.37 kg/m   Intake/Output Summary (Last 24 hours) at 08/10/2020 1610 Last data filed at 08/10/2020 0600 Gross per 24 hour  Intake 490 ml  Output 2750 ml  Net -2260 ml   Weight change: 3 kg  Physical Exam: [id number] ill appearing-  eyes open-  seems to understand what I am saying CVS:s1s2, rrr Resp:trached, bl chest expansion, on full vent support NWG:NFAO Ext:  2+ diffuse edema but better Neuro: functional quadriplegic, opening eyes, looking around  Imaging: DG CHEST PORT 1 VIEW  Result Date: 08/09/2020 CLINICAL DATA:  Acute respiratory failure.  Hypoxia. EXAM: PORTABLE CHEST 1 VIEW COMPARISON:  07/27/2020. FINDINGS: Interval removal of right IJ line and placement of dual-lumen catheter. Dual lumen catheter tip at cavoatrial junction. Tracheostomy tube in stable position. Heart size normal. Progressive bibasilar pulmonary infiltrates/edema. Small bilateral pleural effusions again noted. No pneumothorax. IMPRESSION: 1. Interim removal of right IJ line and placement of  dual-lumen catheter. Dual-lumen catheter tip at cavoatrial junction. Tracheostomy tube in stable position. 2. Progressive bibasilar pulmonary infiltrates/edema. Small bilateral pleural effusions again noted. Electronically Signed   By: Marcello Moores  Register   On: 08/09/2020 06:03    Labs: BMET Recent Labs  Lab 08/04/20 6546 08/05/20 0352 08/06/20 0341 08/07/20 0906 08/08/20 0220 08/09/20 0022 08/10/20 0111  NA 134* 133* 130* 134* 134* 133* 134*  K 4.9 4.1 4.3 4.3 4.0 4.0 3.0*  CL 94* 97* 93* 94* 98 95* 97*  CO2 _0 GLUCOSE 139* 142* 127* 114* 171* 152* 221*  BUN 98* 74* 103* 139* 72* 120* 72*  CREATININE 1.12 0.85 1.17 1.53* 1.01 1.33* 0.88  CALCIUM 8.9 8.7* 8.8* 8.8* 8.6* 9.1 9.5  PHOS 4.9* 4.7* 6.2* 7.1* 4.9* 5.5* 3.4   CBC Recent Labs  Lab 08/05/20 0352 08/06/20 0341 08/08/20 0431 08/09/20 0022  WBC 18.5* 16.6* 18.3* 14.0*  NEUTROABS  --   --   --  11.6*  HGB 8.1* 8.3* 8.6* 8.7*  HCT 24.9* 25.5* 27.0* 26.6*  MCV 81.1 81.5 83.3 82.9  PLT PLATELET CLUMPS NOTED ON SMEAR, UNABLE TO ESTIMATE PLATELET CLUMPS NOTED ON SMEAR, UNABLE TO ESTIMATE [phone]    Medications:     amiodarone  200 mg Per Tube Daily   apixaban  5 mg Per Tube BID   buPROPion  75 mg Per Tube BID   chlorhexidine gluconate (MEDLINE KIT)  15 mL Mouth Rinse BID   Chlorhexidine Gluconate Cloth  6 each Topical Q0600   darbepoetin (ARANESP) injection - DIALYSIS  150 mcg Intravenous Q Wed-HD   docusate  100 mg Per Tube BID   feeding supplement (PROSource TF)  45 mL Per Tube TID   guaiFENesin  5 mL Per Tube BID   insulin aspart  2 Units Subcutaneous Q4H   insulin glargine  10 Units Subcutaneous Daily   mouth rinse  15 mL Mouth Rinse 10 times per day   midodrine  10 mg Per Tube TID   multivitamin  1 tablet Per Tube QHS   nutrition supplement (JUVEN)  1 packet Per Tube BID BM   pantoprazole sodium  40 mg Per Tube QHS   polyethylene glycol  17 g Per Tube Daily   sertraline  100 mg Per Tube Daily   sodium chloride flush  10-40 mL Intracatheter Q12H      [name], MD 08/10/2020, 8:23 AM

## 2020-08-11 DIAGNOSIS — G8250 Quadriplegia, unspecified: Secondary | ICD-10-CM | POA: Diagnosis not present

## 2020-08-11 DIAGNOSIS — J9601 Acute respiratory failure with hypoxia: Secondary | ICD-10-CM | POA: Diagnosis not present

## 2020-08-11 DIAGNOSIS — N179 Acute kidney failure, unspecified: Secondary | ICD-10-CM | POA: Diagnosis not present

## 2020-08-11 LAB — MAGNESIUM: Magnesium: 2.1 mg/dL (ref 1.7–2.4)

## 2020-08-11 LAB — GLUCOSE, CAPILLARY
Glucose-Capillary: 142 mg/dL — ABNORMAL HIGH (ref 70–99)
Glucose-Capillary: 134 mg/dL — ABNORMAL HIGH (ref 70–99)
Glucose-Capillary: 135 mg/dL — ABNORMAL HIGH (ref 70–99)
Glucose-Capillary: 157 mg/dL — ABNORMAL HIGH (ref 70–99)
Glucose-Capillary: 147 mg/dL — ABNORMAL HIGH (ref 70–99)
Glucose-Capillary: 160 mg/dL — ABNORMAL HIGH (ref 70–99)

## 2020-08-11 LAB — CBC WITH DIFFERENTIAL/PLATELET
Abs Immature Granulocytes: 0.16 10*3/uL — ABNORMAL HIGH (ref 0.00–0.07)
Basophils Absolute: 0 10*3/uL (ref 0.0–0.1)
Basophils Relative: 0 %
Eosinophils Absolute: 0.1 10*3/uL (ref 0.0–0.5)
Eosinophils Relative: 1 %
HCT: 27.9 % — ABNORMAL LOW (ref 39.0–52.0)
Hemoglobin: 8.8 g/dL — ABNORMAL LOW (ref 13.0–17.0)
Immature Granulocytes: 1 %
Lymphocytes Relative: 7 %
Lymphs Abs: 1 10*3/uL (ref 0.7–4.0)
MCH: 26.8 pg (ref 26.0–34.0)
MCHC: 31.5 g/dL (ref 30.0–36.0)
MCV: 85.1 fL (ref 80.0–100.0)
Monocytes Absolute: 1.5 10*3/uL — ABNORMAL HIGH (ref 0.1–1.0)
Monocytes Relative: 10 %
Neutro Abs: 12.6 10*3/uL — ABNORMAL HIGH (ref 1.7–7.7)
Neutrophils Relative %: 81 %
Platelets: 302 10*3/uL (ref 150–400)
RBC: 3.28 MIL/uL — ABNORMAL LOW (ref 4.22–5.81)
RDW: 21.9 % — ABNORMAL HIGH (ref 11.5–15.5)
WBC: 15.5 10*3/uL — ABNORMAL HIGH (ref 4.0–10.5)
nRBC: 3.4 % — ABNORMAL HIGH (ref 0.0–0.2)

## 2020-08-11 LAB — RENAL FUNCTION PANEL
Albumin: 2.6 g/dL — ABNORMAL LOW (ref 3.5–5.0)
Anion gap: 14 (ref 5–15)
BUN: 128 mg/dL — ABNORMAL HIGH (ref 8–23)
CO2: 25 mmol/L (ref 22–32)
Calcium: 9.6 mg/dL (ref 8.9–10.3)
Chloride: 95 mmol/L — ABNORMAL LOW (ref 98–111)
Creatinine, Ser: 1.28 mg/dL — ABNORMAL HIGH (ref 0.61–1.24)
GFR, Estimated: >60 mL/min (ref >60)
Glucose, Bld: 166 mg/dL — ABNORMAL HIGH (ref 70–99)
Phosphorus: 4.6 mg/dL (ref 2.5–4.6)
Potassium: 3.6 mmol/L (ref 3.5–5.1)
Sodium: 134 mmol/L — ABNORMAL LOW (ref 135–145)

## 2020-08-11 MED ORDER — ALBUMIN HUMAN 25 % IV SOLN
INTRAVENOUS | Status: AC
  Administered 2020-08-12: 50 g
  Filled 2020-08-11: qty 100

## 2020-08-11 NOTE — Progress Notes (Signed)
[name] Progress Note     Assessment/ Plan:   1) AKI -baseline Cr 0.4. AKI due to ATN /shock. CRRT initiated on 7/6 for anasarca with ineffective diuresis and uremia.  CCM for rij temp line placement 7/39- 6 days old - CRRT 7/6 - 7/15, now off due to improved hemodynamics -Not a good long term candidate for HD due to poor functional status, vent dependency. Dr. Candiss Norse voiced this concern to both son and wife over the phone on 7/6 and they wished to proceed with RRT.  Appreciate palliative care's assistance.  Dr. Johnney Ou discussed concern for long term dialysis to wife and son 7/12 and again on 7/17.  The plan was continue iHD through this past week given he's out of shock phase but if no signs of recovery then reconvene.  He already had been living at [facility] for several years prior to this admission.  Palliative care has been meeting with family daily and family not willing to stop dialysis at this point.     HD 7/26 UF net 3L tolerated, HD 7/28 with net UF 2.5L. Next treatment planned for Sat on TTS schedule.  Appreciate VIR converting to  tunneled HD cath 7/26. Will place orders for HD Sat and f/u early Sat AM whether family wants to continue aggressive measures (decision may be made by family today) bec of limited staffing. Family meeting 7/29 -> family wants continued dialysis even if it means transfer out of state.   HD today and then Mon.   2) Hyperkalemia shifted and lokelma given PRN   3) CKD-MBD, hyperphosphatemia -phos had been ok with CRRT, then up, on nepro, off renvela now   4) Acute metabolic encephalopathy, likely related to sepsis and uremia:  CT head unrevealing, seems to be improving   5) Septic shock:  resolved -off pressors now -off abx as of 7/15   6) Chronic respiratory failure s/p trach -vent mgmt per ccm   7) Quadriplegia 2/2 compressive myelopathy -resides at [facility] PTA -appreciate palliative care's assistance-  [facility] will not take him  back if he is dialysis dependent   8) Microcytic anemia -transfuse for hgb <7. give iron load as severely iron deficient on 6/30 labs (t sat 5%) - last given blood on 7/19. on ESA    Subjective:   No changes overnight. TMax 99.2. Pain in the neck region but no discharge or bleeding.   Objective:   BP (!) 96/59 (BP Location: Left Arm)   Pulse 73   Temp 98.2 F (36.8 C) (Oral)   Resp (!) 30   Ht 5' 6"  (1.676 m)   Wt 74.9 kg   SpO2 97%   BMI 26.65 kg/m   Intake/Output Summary (Last 24 hours) at 08/11/2020 0820 Last data filed at 08/11/2020 0600 Gross per 24 hour  Intake 2710 ml  Output 325 ml  Net 2385 ml   Weight change: -4.1 kg  Physical Exam: Gen: chronically ill appearing CVS:s1s2, rrr Resp:trached, bl chest expansion, on vent, no bleeding from trach site BJS:EGBT Ext:  2+ diffuse edema but better Neuro: functional quadriplegic, opening eyes, looking around  Imaging: No results found.  Labs: BMET Recent Labs  Lab 08/05/20 0352 08/06/20 0341 08/07/20 0906 08/08/20 0220 08/09/20 0022 08/10/20 0111 08/11/20 0023  NA 133* 130* 134* 134* 133* 134* 134*  K 4.1 4.3 4.3 4.0 4.0 3.0* 3.6  CL 97* 93* 94* 98 95* 97* 95*  CO2 24 24 25 22 24 27 25   GLUCOSE  142* 127* 114* 171* 152* 221* 166*  BUN 74* 103* 139* 72* 120* 72* 128*  CREATININE 0.85 1.17 1.53* 1.01 1.33* 0.88 1.28*  CALCIUM 8.7* 8.8* 8.8* 8.6* 9.1 9.5 9.6  PHOS 4.7* 6.2* 7.1* 4.9* 5.5* 3.4 4.6   CBC Recent Labs  Lab 08/06/20 0341 08/08/20 0431 08/09/20 0022 08/11/20 0023  WBC 16.6* 18.3* 14.0* 15.5*  NEUTROABS  --   --  11.6* 12.6*  HGB 8.3* 8.6* 8.7* 8.8*  HCT 25.5* 27.0* 26.6* 27.9*  MCV 81.5 83.3 82.9 85.1  PLT PLATELET CLUMPS NOTED ON SMEAR, UNABLE TO ESTIMATE [phone]    Medications:     amiodarone  200 mg Per Tube Daily   apixaban  5 mg Per Tube BID   buPROPion  75 mg Per Tube BID   chlorhexidine gluconate (MEDLINE KIT)  15 mL Mouth Rinse BID   Chlorhexidine Gluconate Cloth  6  each Topical Q0600   darbepoetin (ARANESP) injection - DIALYSIS  150 mcg Intravenous Q Wed-HD   docusate  100 mg Per Tube BID   feeding supplement (PROSource TF)  45 mL Per Tube TID   guaiFENesin  5 mL Per Tube BID   insulin aspart  2 Units Subcutaneous Q4H   insulin glargine  10 Units Subcutaneous Daily   mouth rinse  15 mL Mouth Rinse 10 times per day   midodrine  10 mg Per Tube TID   multivitamin  1 tablet Per Tube QHS   nutrition supplement (JUVEN)  1 packet Per Tube BID BM   pantoprazole sodium  40 mg Per Tube QHS   polyethylene glycol  17 g Per Tube Daily   sertraline  100 mg Per Tube Daily   sodium chloride flush  10-40 mL Intracatheter Q12H      Luis Santee, MD 08/11/2020, 8:20 AM

## 2020-08-11 NOTE — Progress Notes (Signed)
Patient ID: Luis Orr, male   DOB: 16-Aug-1950, 70 y.o.   MRN: [id number]  PROGRESS NOTE    Luis Orr  CXK:[id number] DOB: 04-12-1950 DOA: 06/28/2020 PCP: Townsend Roger, MD   Brief Narrative:  70 y.o. male from [facility] with hx of C3-C5 compressive myelopathy with functional quadriplegia and chronic trach/vent presented secondary to altered mental status and hypotension with recurrent pneumonia requiring vasopressor support and ICU admission.  ICU significant events: 6/29 admitted to ICU for septic shock on levo to maintain MAP>65, continued on vent support 6/29 Blood Cx>> staph species in 1 of 4 cultures drawn>> suspect contaminant 6/30 Off pressors 7/2 ID Consult for MDR acinetobacter in trach asp, Meropenem changed to unasyn 7/2 PRBC transfusion, iron replacement 7/5 Pressors added again. Central line placed. Drainage from gastrostomy tube >> CT Abd without fluid collection around gastrostomy tube but with diffuse body wall edema. Echo EF 50-55%, mild LVH 7/6 Renal Replacement Therapy ; Code status changed to partial Code (DNR) 7/7 Transfuse 1U PRBC 7/9 add solu cortef 7/10 off pressors, weaning stress steroids 7/11 Pressors added back on again 7/12 ID consult due to rising WBC, hypothermia, hypotension concern for worsening infection. Vancomycin added. Repeat blood cx collected. 7/15 CRRT stopped 7/17 Restarted on low dose levo. No evidence of new infection. Held off on antibiotics. Nephrology contacted family and decision was made to continue iHD this week to allow additional time for renal recovery. Patient is not a long term iHD candidate 7/18 plans for iHD, tolerated with 2L removed 7/19 slight drop in hgb to 6.9 will transfuse 1 unit PRBC 7/22>7/26: Tolerating HD. TOC consulted for placement. Will likely need out of state placement.   He was subsequently transferred to [facility] service.  Assessment & Plan:   Acute on chronic hypoxic and hypercapnic respiratory  failure -Secondary to pneumonia/volume overload. Now resolved and back to baseline. Currently on ventilator support per PCCM and is at baseline.  PCCM following intermittently  Ventilator associated pneumonia -Present on admission. Sputum culture significant for acinetobacter baumannii.  -Patient was treated with Unasyn x14 days. Completed course.   Leukocytosis -Secondary to above -monitor  Volume overload -Secondary to renal failure with anuria. Patient has improved with CRRT and now on intermittent hemodialysis.  Still with anasarca. Patient will continue hemodialysis   AKI with oliguria -Now appears patient is anuric.  -Nephrology following: Was initially on CRRT and currently on intermittent HD.  Issues with HD are that patient cannot go back to his SNF. Palliative care has been consulted and are following. [place] placed on 08/07/2020 -Patient is a poor candidate for long-term dialysis but family wants to continue intermittent dialysis for now. -Family wants to continue aggressive care at this time.   Septic shock -Present on admission. Secondary to pneumonia. Patient required vasopressor support which is now weaned off. Resolved. Initial blood culture significant for likely contaminant. Repeat blood cultures negative.   Acute metabolic encephalopathy -Secondary to acute illness. CT head negative for acute process. Appears improved and likely back at baseline.   Diabetes mellitus, type 2 with hyperglycemia -Insulin needs have decreased. 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 Improving.   Quadriplegia -Secondary to compressive myelopathy. Patient resides at [facility] but per chart review, [facility] will not accept him back if he is HD dependent. -Education officer, museum following   Microcytic anemia Acute anemia Patient required 4 units  of PRBC during this hospitalization.   Thought to be secondary to a combination of acute illness, chronic disease and iron deficiency. IV iron given.  Hemoglobin currently stable.   Severe malnutrition -Continue tube feeds   Paroxysmal atrial fibrillation -Continue amiodarone   Pressure injury : Medial/posterior sacrum present on admission. Right/posterior/lateral ankle unknown if present on admission. Left ear, not present on admission.  -Continue local wound care  Generalized deconditioning -Overall prognosis is very poor.  Family wants to continue current treatment including trach/vent and intermittent hemodialysis.   DVT prophylaxis: Heparin Code Status:   Code Status: Partial Code Family Communication: None at bedside Disposition Plan: Status is: Inpatient  Remains inpatient appropriate because:Inpatient level of care appropriate due to severity of illness  Dispo: The patient is from:  [facility]              Anticipated d/c is to: SNF              Patient currently is not medically stable to d/c.   Difficult to place patient Yes  Consultants:  PCCM Nephrology Infectious disease Palliative care medicine  Procedures: CRRT followed by intermittent hemodialysis.  [place] placed on 08/07/2020  Antimicrobials:  Anti-infectives (From admission, onward)    Start     Dose/Rate Route Frequency Ordered Stop   08/07/20 1012  ceFAZolin (ANCEF) IVPB 2g/100 mL premix        over 30 Minutes Intravenous Continuous PRN 08/07/20 1023 08/07/20 1012   08/07/20 0000  ceFAZolin (ANCEF) IVPB 2g/100 mL premix        2 g 200 mL/hr over 30 Minutes Intravenous To Radiology 08/05/20 1244 08/07/20 0013   08/06/20 0000  ceFAZolin (ANCEF) IVPB 2g/100 mL premix  Status:  Discontinued        2 g 200 mL/hr over 30 Minutes Intravenous To Radiology 08/05/20 1201 08/05/20 1244   07/25/20 1700  vancomycin (VANCOCIN) IVPB 1000 mg/200 mL premix  Status:  Discontinued        1,000 mg 200 mL/hr over 60 Minutes Intravenous Every 24 hours 07/24/20 1606  07/27/20 0815   07/24/20 1700  vancomycin (VANCOREADY) IVPB 1750 mg/350 mL        1,750 mg 175 mL/hr over 120 Minutes Intravenous  Once 07/24/20 1606 07/24/20 2005   07/19/20 1000  Ampicillin-Sulbactam (UNASYN) 3 g in sodium chloride 0.9 % 100 mL IVPB        3 g 200 mL/hr over 30 Minutes Intravenous Every 6 hours 07/19/20 0848 07/28/20 0342   07/18/20 1200  Ampicillin-Sulbactam (UNASYN) 3 g in sodium chloride 0.9 % 100 mL IVPB  Status:  Discontinued        3 g 200 mL/hr over 30 Minutes Intravenous Every 8 hours 07/18/20 0720 07/19/20 0848   07/14/20 1530  Ampicillin-Sulbactam (UNASYN) 3 g in sodium chloride 0.9 % 100 mL IVPB  Status:  Discontinued        3 g 200 mL/hr over 30 Minutes Intravenous Every 6 hours 07/14/20 1440 07/18/20 0720   07/13/20 2000  meropenem (MERREM) 2 g in sodium chloride 0.9 % 100 mL IVPB  Status:  Discontinued        2 g 200 mL/hr over 30 Minutes Intravenous Every 12 hours 07/13/20 1433 07/14/20 1440   07/13/20 1432  vancomycin variable dose per unstable renal function (pharmacist dosing)  Status:  Discontinued         Does not apply See admin instructions 07/13/20 1433 07/15/20 0937   07/13/20 1400  vancomycin (VANCOCIN) IVPB 750 mg/150 ml premix  Status:  Discontinued        750 mg 150 mL/hr over 60 Minutes Intravenous Every 12 hours 07/13/20 1321 07/13/20 1430   07/12/20 1248  vancomycin (VANCOREADY) IVPB 750 mg/150 mL  Status:  Discontinued        750 mg 150 mL/hr over 60 Minutes Intravenous Every 12 hours 07/12/20 1248 07/13/20 1321   06/24/2020 2300  meropenem (MERREM) 2 g in sodium chloride 0.9 % 100 mL IVPB  Status:  Discontinued        2 g 200 mL/hr over 30 Minutes Intravenous Every 8 hours 06/20/2020 2209 07/13/20 1433   07/10/2020 2205  vancomycin variable dose per unstable renal function (pharmacist dosing)  Status:  Discontinued         Does not apply See admin instructions 07/02/2020 2209 07/12/20 1248   07/07/2020 1845  vancomycin (VANCOREADY) IVPB 1500  mg/300 mL        1,500 mg 150 mL/hr over 120 Minutes Intravenous  Once 07/05/2020 1843 07/05/2020 2132   06/14/2020 1845  ceFEPIme (MAXIPIME) 2 g in sodium chloride 0.9 % 100 mL IVPB        2 g 200 mL/hr over 30 Minutes Intravenous  Once 07/12/2020 1843 07/07/2020 1952        Subjective: Patient seen and examined at bedside.  No agitation, seizures, fever or vomiting reported.   Objective: Vitals:   08/11/20 0346 08/11/20 0400 08/11/20 0500 08/11/20 0600  BP:  125/64 123/66 120/64  Pulse:  82 83 80  Resp:  (!) 30 (!) 30 (!) 30  Temp: 98.5 F (36.9 C)     TempSrc: Oral     SpO2:  97% 97% 97%  Weight:   74.9 kg   Height:        Intake/Output Summary (Last 24 hours) at 08/11/2020 0718 Last data filed at 08/11/2020 0600 Gross per 24 hour  Intake 2710 ml  Output 325 ml  Net 2385 ml    Filed Weights   08/09/20 1340 08/10/20 0500 08/11/20 0500  Weight: 79 kg 74.1 kg 74.9 kg    Examination:  General exam:  Appears chronically ill and deconditioned.  No acute distress. ENT: Currently on the ventilator via trach Respiratory system: Decreased breath sounds at bases bilaterally with some crackles  cardiovascular system: Rate controlled, S1-S2 heard gastrointestinal system: Abdomen is slightly distended, soft and nontender.  Bowel sounds are heard.  PEG tube present. Extremities: Bilateral lower extremity edema present; no clubbing Central nervous system: Hardly follows any commands.  Quadriplegia present.   Skin: No obvious petechiae/rashes psychiatry: Cannot assess because of mental status  Data Reviewed: I have personally reviewed following labs and imaging studies  CBC: Recent Labs  Lab 08/05/20 0352 08/06/20 0341 08/08/20 0431 08/09/20 0022 08/11/20 0023  WBC 18.5* 16.6* 18.3* 14.0* 15.5*  NEUTROABS  --   --   --  11.6* 12.6*  HGB 8.1* 8.3* 8.6* 8.7* 8.8*  HCT 24.9* 25.5* 27.0* 26.6* 27.9*  MCV 81.1 81.5 83.3 82.9 85.1  PLT PLATELET CLUMPS NOTED ON SMEAR, UNABLE TO  ESTIMATE PLATELET CLUMPS NOTED ON SMEAR, UNABLE TO ESTIMATE [phone]    Basic Metabolic Panel: Recent Labs  Lab 08/07/20 0906 08/08/20 0220 08/09/20 0022 08/10/20 0111 08/11/20 0023  NA 134* 134* 133* 134* 134*  K 4.3 4.0 4.0 3.0* 3.6  CL 94* 98 95* 97* 95*  CO2 _0 GLUCOSE 114* 171* 152*  221* 166*  BUN 139* 72* 120* 72* 128*  CREATININE 1.53* 1.01 1.33* 0.88 1.28*  CALCIUM 8.8* 8.6* 9.1 9.5 9.6  MG 2.3 2.1 2.1 2.0 2.1  PHOS 7.1* 4.9* 5.5* 3.4 4.6    GFR: Estimated Creatinine Clearance: 48.5 mL/min (A) (by C-G formula based on SCr of 1.28 mg/dL (H)). Liver Function Tests: Recent Labs  Lab 08/07/20 0906 08/08/20 0220 08/09/20 0022 08/10/20 0111 08/11/20 0023  ALBUMIN 2.3* 2.3* 2.3* 2.8* 2.6*    No results for input(s): LIPASE, AMYLASE in the last 168 hours. No results for input(s): AMMONIA in the last 168 hours. Coagulation Profile: No results for input(s): INR, PROTIME in the last 168 hours. Cardiac Enzymes: No results for input(s): CKTOTAL, CKMB, CKMBINDEX, TROPONINI in the last 168 hours. BNP (last 3 results) No results for input(s): PROBNP in the last 8760 hours. HbA1C: No results for input(s): HGBA1C in the last 72 hours. CBG: Recent Labs  Lab 08/10/20 1113 08/10/20 1613 08/10/20 1950 08/10/20 2310 08/11/20 0344  GLUCAP 161* 178* 131* 150* 142*    Lipid Profile: No results for input(s): CHOL, HDL, LDLCALC, TRIG, CHOLHDL, LDLDIRECT in the last 72 hours. Thyroid Function Tests: No results for input(s): TSH, T4TOTAL, FREET4, T3FREE, THYROIDAB in the last 72 hours. Anemia Panel: No results for input(s): VITAMINB12, FOLATE, FERRITIN, TIBC, IRON, RETICCTPCT in the last 72 hours. Sepsis Labs: No results for input(s): PROCALCITON, LATICACIDVEN in the last 168 hours.  No results found for this or any previous visit (from the past 240 hour(s)).       Radiology Studies: No results found.      Scheduled Meds:  amiodarone  200 mg  Per Tube Daily   apixaban  5 mg Per Tube BID   buPROPion  75 mg Per Tube BID   chlorhexidine gluconate (MEDLINE KIT)  15 mL Mouth Rinse BID   Chlorhexidine Gluconate Cloth  6 each Topical Q0600   darbepoetin (ARANESP) injection - DIALYSIS  150 mcg Intravenous Q Wed-HD   docusate  100 mg Per Tube BID   feeding supplement (PROSource TF)  45 mL Per Tube TID   guaiFENesin  5 mL Per Tube BID   insulin aspart  2 Units Subcutaneous Q4H   insulin glargine  10 Units Subcutaneous Daily   mouth rinse  15 mL Mouth Rinse 10 times per day   midodrine  10 mg Per Tube TID   multivitamin  1 tablet Per Tube QHS   nutrition supplement (JUVEN)  1 packet Per Tube BID BM   pantoprazole sodium  40 mg Per Tube QHS   polyethylene glycol  17 g Per Tube Daily   sertraline  100 mg Per Tube Daily   sodium chloride flush  10-40 mL Intracatheter Q12H   Continuous Infusions:  sodium chloride Stopped (07/26/20 0824)   sodium chloride 250 mL (07/31/20 1428)   sodium chloride     sodium chloride     feeding supplement (NEPRO CARB STEADY) 1,000 mL (08/09/20 1405)          Aline August, MD [facility] 08/11/2020, 7:18 AM

## 2020-08-11 NOTE — Progress Notes (Signed)
   08/11/20 0728  Tracheostomy Shiley XLT Proximal 6 mm Cuffed  Placement Date: 07/05/2020   Inserted prior to hospital arrival?: Placed at another facility  Brand: Shiley XLT Proximal  Size (mm): 6 mm  Style: Cuffed  Status Secured  Site Assessment Clean  Site Care Cleansed  Inner Cannula Care Changed/new  Ties Assessment Secure  Cuff Pressure (cm H2O) Clear OR 27-39 CmH2O  Tracheostomy Equipment at bedside Yes and checklist posted at head of bed  Adult Ventilator Settings  Vent Type Servo i  Humidity HME  Vent Mode PRVC  Vt Set 380 mL  Set Rate 30 bmp  FiO2 (%) 40 %  I Time 0.8 Sec(s)  PEEP 5 cmH20  Adult Ventilator Measurements  Peak Airway Pressure 30 L/min  Mean Airway Pressure 14 cmH20  Plateau Pressure 28 cmH20  Resp Rate Spontaneous 0 br/min  Resp Rate Total 30 br/min  Exhaled Vt 372 mL  Measured Ve 11.3 mL  I:E Ratio Measured 1:1.5  Auto PEEP 2 cmH20  Total PEEP 5 cmH20  SpO2 98 %  Adult Ventilator Alarms  Alarms On Y  Ve High Alarm 18 L/min  Ve Low Alarm 6 L/min  Resp Rate High Alarm 38 br/min  Resp Rate Low Alarm 25  PEEP Low Alarm 3 cmH2O  Press High Alarm 50 cmH2O  VAP Prevention  HME changed No  Ventilator changed No  Transported while on vent No  HOB> 30 Degrees Y  Equipment wiped down Yes  Daily Weaning Assessment  Daily Assessment of Readiness to Wean Wean protocol criteria not met  Reason not met Tracheostomy tube in place (chronic vent)  Breath Sounds  Bilateral Breath Sounds Diminished  Airway Suctioning/Secretions  Suction Type Tracheal  Suction Device  Catheter  Secretion Amount Small  Secretion Color Tan  Secretion Consistency Thick  Suction Tolerance Tolerated well  Suctioning Adverse Effects None

## 2020-08-12 DIAGNOSIS — J9601 Acute respiratory failure with hypoxia: Secondary | ICD-10-CM | POA: Diagnosis not present

## 2020-08-12 DIAGNOSIS — N179 Acute kidney failure, unspecified: Secondary | ICD-10-CM | POA: Diagnosis not present

## 2020-08-12 DIAGNOSIS — G8250 Quadriplegia, unspecified: Secondary | ICD-10-CM | POA: Diagnosis not present

## 2020-08-12 LAB — CBC WITH DIFFERENTIAL/PLATELET
Abs Immature Granulocytes: 0 10*3/uL (ref 0.00–0.07)
Basophils Absolute: 0.2 10*3/uL — ABNORMAL HIGH (ref 0.0–0.1)
Basophils Relative: 1 %
Eosinophils Absolute: 0.6 10*3/uL — ABNORMAL HIGH (ref 0.0–0.5)
Eosinophils Relative: 4 %
HCT: 30.2 % — ABNORMAL LOW (ref 39.0–52.0)
Hemoglobin: 9.6 g/dL — ABNORMAL LOW (ref 13.0–17.0)
Lymphocytes Relative: 10 %
Lymphs Abs: 1.5 10*3/uL (ref 0.7–4.0)
MCH: 26.7 pg (ref 26.0–34.0)
MCHC: 31.8 g/dL (ref 30.0–36.0)
MCV: 83.9 fL (ref 80.0–100.0)
Monocytes Absolute: 1.2 10*3/uL — ABNORMAL HIGH (ref 0.1–1.0)
Monocytes Relative: 8 %
Neutro Abs: 11.7 10*3/uL — ABNORMAL HIGH (ref 1.7–7.7)
Neutrophils Relative %: 77 %
Platelets: 301 10*3/uL (ref 150–400)
RBC: 3.6 MIL/uL — ABNORMAL LOW (ref 4.22–5.81)
RDW: 22.5 % — ABNORMAL HIGH (ref 11.5–15.5)
WBC: 15.2 10*3/uL — ABNORMAL HIGH (ref 4.0–10.5)
nRBC: 4.1 % — ABNORMAL HIGH (ref 0.0–0.2)
nRBC: 6 /100 WBC — ABNORMAL HIGH

## 2020-08-12 LAB — RENAL FUNCTION PANEL
Albumin: 2.6 g/dL — ABNORMAL LOW (ref 3.5–5.0)
Anion gap: 18 — ABNORMAL HIGH (ref 5–15)
BUN: 178 mg/dL — ABNORMAL HIGH (ref 8–23)
CO2: 19 mmol/L — ABNORMAL LOW (ref 22–32)
Calcium: 9.9 mg/dL (ref 8.9–10.3)
Chloride: 95 mmol/L — ABNORMAL LOW (ref 98–111)
Creatinine, Ser: 1.62 mg/dL — ABNORMAL HIGH (ref 0.61–1.24)
GFR, Estimated: 45 mL/min — ABNORMAL LOW (ref >60)
Glucose, Bld: 162 mg/dL — ABNORMAL HIGH (ref 70–99)
Phosphorus: 5.7 mg/dL — ABNORMAL HIGH (ref 2.5–4.6)
Potassium: 4.8 mmol/L (ref 3.5–5.1)
Sodium: 132 mmol/L — ABNORMAL LOW (ref 135–145)

## 2020-08-12 LAB — GLUCOSE, CAPILLARY
Glucose-Capillary: 156 mg/dL — ABNORMAL HIGH (ref 70–99)
Glucose-Capillary: 169 mg/dL — ABNORMAL HIGH (ref 70–99)
Glucose-Capillary: 154 mg/dL — ABNORMAL HIGH (ref 70–99)
Glucose-Capillary: 178 mg/dL — ABNORMAL HIGH (ref 70–99)
Glucose-Capillary: 160 mg/dL — ABNORMAL HIGH (ref 70–99)
Glucose-Capillary: 163 mg/dL — ABNORMAL HIGH (ref 70–99)

## 2020-08-12 LAB — MAGNESIUM: Magnesium: 2.4 mg/dL (ref 1.7–2.4)

## 2020-08-12 MED ORDER — HEPARIN SODIUM (PORCINE) 1000 UNIT/ML IJ SOLN
INTRAMUSCULAR | Status: AC
  Filled 2020-08-12: qty 4

## 2020-08-12 NOTE — Progress Notes (Signed)
[facility] Progress Note     Assessment/ Plan:   1) AKI -baseline Cr 0.4. AKI due to ATN /shock. CRRT initiated on 7/6 for anasarca with ineffective diuresis and uremia.  CCM for rij temp line placement 9/80- 28 days old - CRRT 7/6 - 7/15, now off due to improved hemodynamics -Not a good long term candidate for HD due to poor functional status, vent dependency. Dr. Candiss Norse voiced this concern to both son and wife over the phone on 7/6 and they wished to proceed with RRT.  Appreciate palliative care's assistance.  Dr. Johnney Ou discussed concern for long term dialysis to wife and son 7/12 and again on 7/17.  The plan was continue iHD through this past week given he's out of shock phase but if no signs of recovery then reconvene.  He already had been living at [facility] for several years prior to this admission.  Palliative care has been meeting with family daily and family not willing to stop dialysis at this point.     HD 7/26 UF net 3L tolerated, HD 7/28 with net UF 2.5L. Next treatment planned for Sat on TTS schedule.  Appreciate VIR converting to  tunneled HD cath 7/26.  Family meeting 7/29 -> family wants continued dialysis even if it means transfer out of state.   Seen on HD this AM So far almost 2.5L UF (net 2L)  BP stable after being given Midodrine and Albumin beginning of treatment.  Plan next HD Mon.   2) Hyperkalemia shifted and lokelma given PRN   3) CKD-MBD, hyperphosphatemia -phos had been ok with CRRT, then up, on nepro, off renvela now   4) Acute metabolic encephalopathy, likely related to sepsis and uremia:  CT head unrevealing, seems to be improving   5) Septic shock:  resolved -off pressors now -off abx as of 7/15   6) Chronic respiratory failure s/p trach -vent mgmt per ccm   7) Quadriplegia 2/2 compressive myelopathy -resides at [facility] PTA -appreciate palliative care's assistance-  [facility] will not take him back if he is dialysis dependent   8)  Microcytic anemia -transfuse for hgb <7. give iron load as severely iron deficient on 6/30 labs (t sat 5%) - last given blood on 7/19. on ESA    Subjective:   No changes overnight. TMax 98.1. Pain in the neck region and some bleeding from the trach site.   Objective:   BP 119/87   Pulse 94   Temp 98.1 F (36.7 C) (Oral)   Resp (!) 30   Ht 5' 6"  (1.676 m)   Wt 74.9 kg   SpO2 97%   BMI 26.65 kg/m   Intake/Output Summary (Last 24 hours) at 08/12/2020 0738 Last data filed at 08/12/2020 0400 Gross per 24 hour  Intake 990 ml  Output 400 ml  Net 590 ml   Weight change:   Physical Exam: Gen: chronically ill appearing CVS:s1s2, rrr Resp:trached, bl chest expansion, on vent, no bleeding from trach site AES:LPNP Ext:  2+ diffuse edema but better Neuro: functional quadriplegic, opening eyes, looking around  Imaging: No results found.  Labs: BMET Recent Labs  Lab 08/06/20 0341 08/07/20 0906 08/08/20 0220 08/09/20 0022 08/10/20 0111 08/11/20 0023 08/12/20 0235  NA 130* 134* 134* 133* 134* 134* 132*  K 4.3 4.3 4.0 4.0 3.0* 3.6 4.8  CL 93* 94* 98 95* 97* 95* 95*  CO2 24 25 22 24 27 25  19*  GLUCOSE 127* 114* 171* 152* 221* 166* 162*  BUN 103* 139* 72* 120* 72* 128* 178*  CREATININE 1.17 1.53* 1.01 1.33* 0.88 1.28* 1.62*  CALCIUM 8.8* 8.8* 8.6* 9.1 9.5 9.6 9.9  PHOS 6.2* 7.1* 4.9* 5.5* 3.4 4.6 5.7*   CBC Recent Labs  Lab 08/08/20 0431 08/09/20 0022 08/11/20 0023 08/12/20 0235  WBC 18.3* 14.0* 15.5* 15.2*  NEUTROABS  --  11.6* 12.6* 11.7*  HGB 8.6* 8.7* 8.8* 9.6*  HCT 27.0* 26.6* 27.9* 30.2*  MCV 83.3 82.9 85.1 83.9  PLT 315 304 302 301    Medications:     amiodarone  200 mg Per Tube Daily   apixaban  5 mg Per Tube BID   buPROPion  75 mg Per Tube BID   chlorhexidine gluconate (MEDLINE KIT)  15 mL Mouth Rinse BID   Chlorhexidine Gluconate Cloth  6 each Topical Q0600   darbepoetin (ARANESP) injection - DIALYSIS  150 mcg Intravenous Q Wed-HD   docusate  100  mg Per Tube BID   feeding supplement (PROSource TF)  45 mL Per Tube TID   guaiFENesin  5 mL Per Tube BID   heparin sodium (porcine)       insulin aspart  2 Units Subcutaneous Q4H   insulin glargine  10 Units Subcutaneous Daily   mouth rinse  15 mL Mouth Rinse 10 times per day   midodrine  10 mg Per Tube TID   multivitamin  1 tablet Per Tube QHS   nutrition supplement (JUVEN)  1 packet Per Tube BID BM   pantoprazole sodium  40 mg Per Tube QHS   polyethylene glycol  17 g Per Tube Daily   sertraline  100 mg Per Tube Daily   sodium chloride flush  10-40 mL Intracatheter Q12H      [name], MD 08/12/2020, 7:38 AM

## 2020-08-12 NOTE — Progress Notes (Signed)
   08/12/20 0724  Tracheostomy Shiley XLT Proximal 6 mm Cuffed  Placement Date: 06/20/2020   Inserted prior to hospital arrival?: Placed at another facility  Brand: Shiley XLT Proximal  Size (mm): 6 mm  Style: Cuffed  Status Secured  Site Assessment Oozing secretions  Site Care Cleansed  Inner Cannula Care Changed/new  Ties Assessment Clean  Cuff Pressure (cm H2O) Clear OR 27-39 CmH2O  Tracheostomy Equipment at bedside Yes and checklist posted at head of bed  Adult Ventilator Settings  Vent Type Servo i  Humidity HME  Vent Mode PRVC  Vt Set 380 mL  Set Rate 30 bmp  FiO2 (%) 40 %  I Time 0.8 Sec(s)  PEEP 5 cmH20  Adult Ventilator Measurements  Peak Airway Pressure 31 L/min  Mean Airway Pressure 5 cmH20  Plateau Pressure 27 cmH20  Resp Rate Spontaneous 0 br/min  Resp Rate Total 30 br/min  Exhaled Vt 383 mL  Measured Ve 11.4 mL  I:E Ratio Measured 1:1.5  Auto PEEP 0 cmH20  Total PEEP 5 cmH20  SpO2 97 %  Adult Ventilator Alarms  Alarms On Y  Ve High Alarm 18 L/min  Ve Low Alarm 6 L/min  Resp Rate High Alarm 38 br/min  Resp Rate Low Alarm 25  PEEP Low Alarm 3 cmH2O  Press High Alarm 50 cmH2O  VAP Prevention  HME changed No  Ventilator changed No  Transported while on vent No  HOB> 30 Degrees Y  Daily Weaning Assessment  Daily Assessment of Readiness to Wean Wean protocol criteria not met  Reason not met  (chronic vent)  Breath Sounds  Bilateral Breath Sounds Diminished  Airway Suctioning/Secretions  Suction Type Tracheal  Suction Device  Catheter  Secretion Amount Moderate  Secretion Color Tan  Secretion Consistency Thick  Suction Tolerance Tolerated well  Suctioning Adverse Effects None

## 2020-08-12 NOTE — Progress Notes (Signed)
Patient ID: Luis Orr, male   DOB: 03-30-1950, 70 y.o.   MRN: [id number]  PROGRESS NOTE    Luis Orr  [id number]:[id number] DOB: [birth year] DOA: 06/25/2020 PCP: Townsend Roger, MD   Brief Narrative:  70 y.o. male from [facility] with hx of C3-C5 compressive myelopathy with functional quadriplegia and chronic trach/vent presented secondary to altered mental status and hypotension with recurrent pneumonia requiring vasopressor support and ICU admission.  ICU significant events: 6/29 admitted to ICU for septic shock on levo to maintain MAP>65, continued on vent support 6/29 Blood Cx>> staph species in 1 of 4 cultures drawn>> suspect contaminant 6/30 Off pressors 7/2 ID Consult for MDR acinetobacter in trach asp, Meropenem changed to unasyn 7/2 PRBC transfusion, iron replacement 7/5 Pressors added again. Central line placed. Drainage from gastrostomy tube >> CT Abd without fluid collection around gastrostomy tube but with diffuse body wall edema. Echo EF 50-55%, mild LVH 7/6 Renal Replacement Therapy ; Code status changed to partial Code (DNR) 7/7 Transfuse 1U PRBC 7/9 add solu cortef 7/10 off pressors, weaning stress steroids 7/11 Pressors added back on again 7/12 ID consult due to rising WBC, hypothermia, hypotension concern for worsening infection. Vancomycin added. Repeat blood cx collected. 7/15 CRRT stopped 7/17 Restarted on low dose levo. No evidence of new infection. Held off on antibiotics. Nephrology contacted family and decision was made to continue iHD this week to allow additional time for renal recovery. Patient is not a long term iHD candidate 7/18 plans for iHD, tolerated with 2L removed 7/19 slight drop in hgb to 6.9 will transfuse 1 unit PRBC 7/22>7/26: Tolerating HD. TOC consulted for placement. Will likely need out of state placement.   He was subsequently transferred to [facility] service.  Assessment & Plan:   Acute on chronic hypoxic and hypercapnic respiratory  failure -Secondary to pneumonia/volume overload. Now resolved and back to baseline. Currently on ventilator support per PCCM and is at baseline.  PCCM following intermittently  Ventilator associated pneumonia -Present on admission. Sputum culture significant for acinetobacter baumannii.  -Patient was treated with Unasyn x14 days. Completed course.   Leukocytosis -Secondary to above -monitor  AKI with oliguria -Now appears patient is anuric.  -Nephrology following: Was initially on CRRT and currently on intermittent HD.  Issues with HD are that patient cannot go back to his SNF. Palliative care has been consulted and are following. [place] placed on 08/07/2020 -Patient is a poor candidate for long-term dialysis but family wants to continue intermittent dialysis for now. -Family wants to continue aggressive care at this time.   Septic shock -Present on admission. Secondary to pneumonia. Patient required vasopressor support which is now weaned off. Resolved. Initial blood culture significant for likely contaminant. Repeat blood cultures negative.   Acute metabolic encephalopathy -Secondary to acute illness. CT head negative for acute process. Appears improved and likely back at baseline.   Diabetes mellitus, type 2 with hyperglycemia -Insulin needs have decreased. -Continue Lantus and Novolog    Vasoplegia -Continue midodrine    Shock liver -Secondary to septic shock. Improving.   Quadriplegia -Secondary to compressive myelopathy. Patient resides at [facility] but per chart review, [facility] will not accept him back if he is HD dependent. -Education officer, museum following   Microcytic anemia Acute anemia Patient required 4 units of PRBC during this hospitalization.  Thought to be secondary to a combination of acute illness, chronic disease and iron deficiency. IV iron given.  Hemoglobin currently stable.   Severe malnutrition -Continue tube feeds  Paroxysmal atrial fibrillation -Continue  amiodarone   Pressure injury : Medial/posterior sacrum present on admission. Right/posterior/lateral ankle unknown if present on admission. Left ear, not present on admission.  -Continue local wound care  Generalized deconditioning -Overall prognosis is very poor.  Family wants to continue current treatment including trach/vent and intermittent hemodialysis.   DVT prophylaxis: Heparin Code Status:   Code Status: Partial Code Family Communication: None at bedside Disposition Plan: Status is: Inpatient  Remains inpatient appropriate because:Inpatient level of care appropriate due to severity of illness  Dispo: The patient is from:  [facility]              Anticipated d/c is to: SNF              Patient currently is not medically stable to d/c.   Difficult to place patient Yes  Consultants:  PCCM Nephrology Infectious disease Palliative care medicine  Procedures: CRRT followed by intermittent hemodialysis.  [place] placed on 08/07/2020  Antimicrobials:  Anti-infectives (From admission, onward)    Start     Dose/Rate Route Frequency Ordered Stop   08/07/20 1012  ceFAZolin (ANCEF) IVPB 2g/100 mL premix        over 30 Minutes Intravenous Continuous PRN 08/07/20 1023 08/07/20 1012   08/07/20 0000  ceFAZolin (ANCEF) IVPB 2g/100 mL premix        2 g 200 mL/hr over 30 Minutes Intravenous To Radiology 08/05/20 1244 08/07/20 0013   08/06/20 0000  ceFAZolin (ANCEF) IVPB 2g/100 mL premix  Status:  Discontinued        2 g 200 mL/hr over 30 Minutes Intravenous To Radiology 08/05/20 1201 08/05/20 1244   07/25/20 1700  vancomycin (VANCOCIN) IVPB 1000 mg/200 mL premix  Status:  Discontinued        1,000 mg 200 mL/hr over 60 Minutes Intravenous Every 24 hours 07/24/20 1606 07/27/20 0815   07/24/20 1700  vancomycin (VANCOREADY) IVPB 1750 mg/350 mL        1,750 mg 175 mL/hr over 120 Minutes Intravenous  Once 07/24/20 1606 07/24/20 2005   07/19/20 1000  Ampicillin-Sulbactam (UNASYN) 3 g in sodium  chloride 0.9 % 100 mL IVPB        3 g 200 mL/hr over 30 Minutes Intravenous Every 6 hours 07/19/20 0848 07/28/20 0342   07/18/20 1200  Ampicillin-Sulbactam (UNASYN) 3 g in sodium chloride 0.9 % 100 mL IVPB  Status:  Discontinued        3 g 200 mL/hr over 30 Minutes Intravenous Every 8 hours 07/18/20 0720 07/19/20 0848   07/14/20 1530  Ampicillin-Sulbactam (UNASYN) 3 g in sodium chloride 0.9 % 100 mL IVPB  Status:  Discontinued        3 g 200 mL/hr over 30 Minutes Intravenous Every 6 hours 07/14/20 1440 07/18/20 0720   07/13/20 2000  meropenem (MERREM) 2 g in sodium chloride 0.9 % 100 mL IVPB  Status:  Discontinued        2 g 200 mL/hr over 30 Minutes Intravenous Every 12 hours 07/13/20 1433 07/14/20 1440   07/13/20 1432  vancomycin variable dose per unstable renal function (pharmacist dosing)  Status:  Discontinued         Does not apply See admin instructions 07/13/20 1433 07/15/20 0937   07/13/20 1400  vancomycin (VANCOCIN) IVPB 750 mg/150 ml premix  Status:  Discontinued        750 mg 150 mL/hr over 60 Minutes Intravenous Every 12 hours 07/13/20 1321 07/13/20 1430   07/12/20 1248  vancomycin (VANCOREADY) IVPB 750 mg/150 mL  Status:  Discontinued        750 mg 150 mL/hr over 60 Minutes Intravenous Every 12 hours 07/12/20 1248 07/13/20 1321   07/05/2020 2300  meropenem (MERREM) 2 g in sodium chloride 0.9 % 100 mL IVPB  Status:  Discontinued        2 g 200 mL/hr over 30 Minutes Intravenous Every 8 hours 07/09/2020 2209 07/13/20 1433   06/28/2020 2205  vancomycin variable dose per unstable renal function (pharmacist dosing)  Status:  Discontinued         Does not apply See admin instructions 06/30/2020 2209 07/12/20 1248   07/12/2020 1845  vancomycin (VANCOREADY) IVPB 1500 mg/300 mL        1,500 mg 150 mL/hr over 120 Minutes Intravenous  Once 07/02/2020 1843 06/15/2020 2132   07/10/2020 1845  ceFEPIme (MAXIPIME) 2 g in sodium chloride 0.9 % 100 mL IVPB        2 g 200 mL/hr over 30 Minutes Intravenous   Once 06/29/2020 1843 07/03/2020 1952        Subjective: Patient seen and examined at bedside.  No overnight fever, vomiting, agitation or seizures reported.   Objective: Vitals:   08/12/20 0615 08/12/20 0630 08/12/20 0645 08/12/20 0724  BP: 120/88 (!) 134/93 119/87   Pulse: 88 86 94   Resp: (!) 30 (!) 30 (!) 30   Temp:      TempSrc:      SpO2: 97% 97% 97% 97%  Weight:      Height:        Intake/Output Summary (Last 24 hours) at 08/12/2020 0737 Last data filed at 08/12/2020 0400 Gross per 24 hour  Intake 990 ml  Output 400 ml  Net 590 ml    Filed Weights   08/09/20 1340 08/10/20 0500 08/11/20 0500  Weight: 79 kg 74.1 kg 74.9 kg    Examination:  General exam:  Appears chronically ill and deconditioned.  No distress.   ENT: On vent via tracheostomy  respiratory system: Bilateral decreased breath sounds at bases cardiovascular system: S1-S2 heard, rate controlled gastrointestinal system: Abdomen is distended slightly, soft and nontender.  Normal bowel sounds heard.  PEG tube present. Extremities: No cyanosis; lower extremity edema present bilaterally Central nervous system: Wakes up slightly, hardly follows any commands.  Quadriplegia present.   Skin: No obvious ecchymosis/lesions  psychiatry: Could not be assessed because of mental status  Data Reviewed: I have personally reviewed following labs and imaging studies  CBC: Recent Labs  Lab 08/06/20 0341 08/08/20 0431 08/09/20 0022 08/11/20 0023 08/12/20 0235  WBC 16.6* 18.3* 14.0* 15.5* 15.2*  NEUTROABS  --   --  11.6* 12.6* 11.7*  HGB 8.3* 8.6* 8.7* 8.8* 9.6*  HCT 25.5* 27.0* 26.6* 27.9* 30.2*  MCV 81.5 83.3 82.9 85.1 83.9  PLT PLATELET CLUMPS NOTED ON SMEAR, UNABLE TO ESTIMATE 315 304 302 182    Basic Metabolic Panel: Recent Labs  Lab 08/08/20 0220 08/09/20 0022 08/10/20 0111 08/11/20 0023 08/12/20 0235  NA 134* 133* 134* 134* 132*  K 4.0 4.0 3.0* 3.6 4.8  CL 98 95* 97* 95* 95*  CO2 22 24 27 25  19*   GLUCOSE 171* 152* 221* 166* 162*  BUN 72* 120* 72* 128* 178*  CREATININE 1.01 1.33* 0.88 1.28* 1.62*  CALCIUM 8.6* 9.1 9.5 9.6 9.9  MG 2.1 2.1 2.0 2.1 2.4  PHOS 4.9* 5.5* 3.4 4.6 5.7*    GFR: Estimated Creatinine Clearance: 38.3 mL/min (A) (  by C-G formula based on SCr of 1.62 mg/dL (H)). Liver Function Tests: Recent Labs  Lab 08/08/20 0220 08/09/20 0022 08/10/20 0111 08/11/20 0023 08/12/20 0235  ALBUMIN 2.3* 2.3* 2.8* 2.6* 2.6*    No results for input(s): LIPASE, AMYLASE in the last 168 hours. No results for input(s): AMMONIA in the last 168 hours. Coagulation Profile: No results for input(s): INR, PROTIME in the last 168 hours. Cardiac Enzymes: No results for input(s): CKTOTAL, CKMB, CKMBINDEX, TROPONINI in the last 168 hours. BNP (last 3 results) No results for input(s): PROBNP in the last 8760 hours. HbA1C: No results for input(s): HGBA1C in the last 72 hours. CBG: Recent Labs  Lab 08/11/20 1141 08/11/20 1551 08/11/20 1938 08/11/20 2321 08/12/20 0331  GLUCAP 135* 157* 147* 160* 156*    Lipid Profile: No results for input(s): CHOL, HDL, LDLCALC, TRIG, CHOLHDL, LDLDIRECT in the last 72 hours. Thyroid Function Tests: No results for input(s): TSH, T4TOTAL, FREET4, T3FREE, THYROIDAB in the last 72 hours. Anemia Panel: No results for input(s): VITAMINB12, FOLATE, FERRITIN, TIBC, IRON, RETICCTPCT in the last 72 hours. Sepsis Labs: No results for input(s): PROCALCITON, LATICACIDVEN in the last 168 hours.  No results found for this or any previous visit (from the past 240 hour(s)).       Radiology Studies: No results found.      Scheduled Meds:  amiodarone  200 mg Per Tube Daily   apixaban  5 mg Per Tube BID   buPROPion  75 mg Per Tube BID   chlorhexidine gluconate (MEDLINE KIT)  15 mL Mouth Rinse BID   Chlorhexidine Gluconate Cloth  6 each Topical Q0600   darbepoetin (ARANESP) injection - DIALYSIS  150 mcg Intravenous Q Wed-HD   docusate  100 mg Per  Tube BID   feeding supplement (PROSource TF)  45 mL Per Tube TID   guaiFENesin  5 mL Per Tube BID   heparin sodium (porcine)       insulin aspart  2 Units Subcutaneous Q4H   insulin glargine  10 Units Subcutaneous Daily   mouth rinse  15 mL Mouth Rinse 10 times per day   midodrine  10 mg Per Tube TID   multivitamin  1 tablet Per Tube QHS   nutrition supplement (JUVEN)  1 packet Per Tube BID BM   pantoprazole sodium  40 mg Per Tube QHS   polyethylene glycol  17 g Per Tube Daily   sertraline  100 mg Per Tube Daily   sodium chloride flush  10-40 mL Intracatheter Q12H   Continuous Infusions:  sodium chloride Stopped (07/26/20 0824)   sodium chloride 250 mL (07/31/20 1428)   sodium chloride     sodium chloride     albumin human     feeding supplement (NEPRO CARB STEADY) 1,000 mL (08/11/20 1945)          Aline August, MD [facility] 08/12/2020, 7:37 AM

## 2020-08-13 DIAGNOSIS — R4182 Altered mental status, unspecified: Secondary | ICD-10-CM | POA: Diagnosis not present

## 2020-08-13 DIAGNOSIS — N179 Acute kidney failure, unspecified: Secondary | ICD-10-CM | POA: Diagnosis not present

## 2020-08-13 DIAGNOSIS — G8250 Quadriplegia, unspecified: Secondary | ICD-10-CM | POA: Diagnosis not present

## 2020-08-13 LAB — RENAL FUNCTION PANEL
Albumin: 2.7 g/dL — ABNORMAL LOW (ref 3.5–5.0)
Anion gap: 14 (ref 5–15)
BUN: 127 mg/dL — ABNORMAL HIGH (ref 8–23)
CO2: 26 mmol/L (ref 22–32)
Calcium: 9.9 mg/dL (ref 8.9–10.3)
Chloride: 93 mmol/L — ABNORMAL LOW (ref 98–111)
Creatinine, Ser: 1.26 mg/dL — ABNORMAL HIGH (ref 0.61–1.24)
GFR, Estimated: >60 mL/min (ref >60)
Glucose, Bld: 148 mg/dL — ABNORMAL HIGH (ref 70–99)
Phosphorus: 4.3 mg/dL (ref 2.5–4.6)
Potassium: 3.5 mmol/L (ref 3.5–5.1)
Sodium: 133 mmol/L — ABNORMAL LOW (ref 135–145)

## 2020-08-13 LAB — CBC WITH DIFFERENTIAL/PLATELET
Abs Immature Granulocytes: 0.16 10*3/uL — ABNORMAL HIGH (ref 0.00–0.07)
Basophils Absolute: 0.1 10*3/uL (ref 0.0–0.1)
Basophils Relative: 1 %
Eosinophils Absolute: 0.3 10*3/uL (ref 0.0–0.5)
Eosinophils Relative: 2 %
HCT: 32.4 % — ABNORMAL LOW (ref 39.0–52.0)
Hemoglobin: 10.2 g/dL — ABNORMAL LOW (ref 13.0–17.0)
Immature Granulocytes: 1 %
Lymphocytes Relative: 7 %
Lymphs Abs: 1.1 10*3/uL (ref 0.7–4.0)
MCH: 26.4 pg (ref 26.0–34.0)
MCHC: 31.5 g/dL (ref 30.0–36.0)
MCV: 83.9 fL (ref 80.0–100.0)
Monocytes Absolute: 1.3 10*3/uL — ABNORMAL HIGH (ref 0.1–1.0)
Monocytes Relative: 8 %
Neutro Abs: 12.2 10*3/uL — ABNORMAL HIGH (ref 1.7–7.7)
Neutrophils Relative %: 81 %
Platelets: 187 10*3/uL (ref 150–400)
RBC: 3.86 MIL/uL — ABNORMAL LOW (ref 4.22–5.81)
RDW: 22.4 % — ABNORMAL HIGH (ref 11.5–15.5)
WBC: 15.1 10*3/uL — ABNORMAL HIGH (ref 4.0–10.5)
nRBC: 4 % — ABNORMAL HIGH (ref 0.0–0.2)

## 2020-08-13 LAB — GLUCOSE, CAPILLARY
Glucose-Capillary: 148 mg/dL — ABNORMAL HIGH (ref 70–99)
Glucose-Capillary: 154 mg/dL — ABNORMAL HIGH (ref 70–99)
Glucose-Capillary: 137 mg/dL — ABNORMAL HIGH (ref 70–99)
Glucose-Capillary: 155 mg/dL — ABNORMAL HIGH (ref 70–99)
Glucose-Capillary: 165 mg/dL — ABNORMAL HIGH (ref 70–99)

## 2020-08-13 LAB — MAGNESIUM: Magnesium: 2.2 mg/dL (ref 1.7–2.4)

## 2020-08-13 NOTE — Progress Notes (Signed)
NAME:  HARM TEO, MRN:  [id number], DOB:  March 16, 1950, LOS: 75 ADMISSION DATE:  07/09/2020, CONSULTATION DATE:  07/06/2020 REFERRING MD:  Dr Francia Greaves, EDP CHIEF COMPLAINT:  septic shock    History of Present Illness:  70 yo male from [facility] with hx of C3-C5 compressive myelopathy with functional quadriplegia and chronic trach/vent presented to ED with altered mental status and hypotension with recurrent HCAP.  Has multiple recent admissions for sepsis secondary to various infections including UTIs, pneumonias and bacteremia in setting of MDR Klebsiella, MRSA and pseudomonas. Most recent admission September 2021 in setting of sepsis due to MDR Klebsiella with right ureteral calculus s/p double J ureteral stent placement and treated with meropenem x 10days.  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 Central line placed. Drainage from gastrostomy tube >> CT Abd without fluid collection around gastrostomy tube but with diffuse body wall edema. Echo EF 50-55%, mild LVH  7/6 Renal Replacement Therapy ; Code status changed to partial Code (DNR) 7/7 Transfuse 1U PRBC 7/9 add solu cortef 7/10 off pressors, weaning stress steroids  7/11 Pressors added back on again 7/12 ID consult due to rising WBC, hypothermia, hypotension concern for worsening infection. Vancomycin added. Repeat blood cx collected. 7/15 CRRT stopped   7/17 Restarted on low dose levo. No evidence of new infection. Held off on antibiotics. Nephrology contacted family and decision was made to continue iHD this week to allow additional time for renal recovery. Patient is not a long term iHD candidate  7/18 plans for iHD, tolerated with 2L removed  7/19 slight drop in hgb to 6.9 will transfuse 1 unit PRBC  7/20 To [facility], PCCM following for trach / vent needs 7/22>> tolerating HD, will need out of state placement  Interim History / Subjective:  No events. Denies pain. Remains on vent.  Objective   Blood pressure 128/70, pulse 69, temperature 97.9 F (36.6 C), temperature source Oral, resp. rate (!) 30, height '5\' 6"'$  (1.676 m), weight 75.4 kg, SpO2 98 %.    Vent Mode: PRVC FiO2 (%):  [40 %] 40 % Set Rate:  [30 bmp] 30 bmp Vt Set:  [380 mL] 380 mL PEEP:  [5 cmH20] 5 cmH20 Plateau Pressure:  [22 cmH20-29 cmH20] 22 cmH20   Intake/Output Summary (Last 24 hours) at 08/13/2020 0658 Last data filed at 08/12/2020 1800 Gross per 24 hour  Intake --  Output 2275 ml  Net -2275 ml    Filed Weights   08/10/20 0500 08/11/20 0500 08/13/20 0500  Weight: 74.1 kg 74.9 kg 75.4 kg    Physical Exam: No distress Starting to develop contractures Passive on vent Small thick secretions PEG in place Rhonci bilaterally  Patient Lines/Drains/Airways Status     Active Line/Drains/Airways     Name Placement date Placement time Site Days   Peripheral IV 07/12/20 22 G 1.75" Anterior;Left;Proximal Forearm 07/12/20  1252  Forearm  32   Hemodialysis Catheter Right Internal  jugular Double lumen Permanent (Tunneled) 08/07/20  1032  Internal jugular  6   Gastrostomy/Enterostomy 07/17/19  0330  --  393   Gastrostomy/Enterostomy Gastrostomy Other (comment) --  --  Other (comment)  --   Colostomy LLQ --  --  LLQ  --   Tracheostomy Shiley XLT Proximal 6 mm Cuffed 07/06/2020  --  6 mm  33   Pressure Injury 07/08/2020 Sacrum Posterior;Medial Stage 4 - Full thickness  tissue loss with exposed bone, tendon or muscle. 06/18/2020  2240  -- 33   Pressure Injury 06/16/2020 Sacrum Medial Deep Tissue Pressure Injury - Purple or maroon localized area of discolored intact skin or blood-filled blister due to damage of underlying soft tissue from pressure and/or shear. black 06/15/2020  2240  -- 33   Pressure Injury 07/22/20 Ankle Posterior;Right;Lateral Stage 1 -  Intact skin with non-blanchable redness of a localized area usually over a bony prominence. red nonblanchable area 07/22/20  1035  -- 22   Pressure Injury 07/23/20 Ear Left Deep Tissue Pressure Injury - Purple or maroon localized area of discolored intact skin or blood-filled blister due to damage of underlying soft tissue from pressure and/or shear. DTI left ear 07/23/20  2000  -- 21   Wound / Incision (Open or Dehisced) 07/16/20 Skin tear Back Right pink 07/16/20  0800  Back  28   Wound / Incision (Open or Dehisced) 08/10/20 Skin tear Penis Anterior 08/10/20  2000  Penis  3             Resolved Hospital Problem list   Hyperkalemia Tachycardia Hypotension Thrombocytosis  AKI Septic shock 2nd to HCAP and UTI all present on admission -Acinetobacter in sputum culture from 07/12/20. Completed 14 days of Unasyn for -Of pressors on 4/14. Restarted  levo 7/17.  Assessment & Plan:   Acute on chronic hypoxic and hypercarbic respiratory failure secondary pna and volume overload Tracheostomy and ventilator dependent status. -PRVC 8cc/kg as rest mode -has #6 proximial XLT trach  -trach care per protocol  -VAP prevention measures  -TOC ongoing placement efforts appreciated  [facility]- iHD per nephrology  Goals of Care DNR in event of cardiac arrest -per [facility] -Palliative care following   Will check on again 8/4 unless any issues  Erskine Emery MD PCCM

## 2020-08-13 NOTE — Progress Notes (Signed)
Nutrition Follow-up  DOCUMENTATION CODES:   Not applicable  INTERVENTION:   TF via PEG: Nepro at 45 ml/h (1080 ml per day). Prosource TF 45 ml TID   Provides 2064 kcal, 120 gm protein, 785 ml free water daily.   Continue Renal MVI daily.   Add Juven BID via tube, each packet provides 80 calories, 8 grams of carbohydrate, 2.5  grams of protein (collagen), 7 grams of L-arginine and 7 grams of L-glutamine; supplement contains CaHMB, Vitamins C, E, B12 and Zinc to promote wound healing  NUTRITION DIAGNOSIS:   Increased nutrient needs related to wound healing as evidenced by estimated needs.  Ongoing  GOAL:   Patient will meet greater than or equal to 90% of their needs  Met with TF  MONITOR:   Vent status, Labs, Weight trends, TF tolerance, Skin, I & O's  REASON FOR ASSESSMENT:   Ventilator, Consult Enteral/tube feeding initiation and management  ASSESSMENT:   70 year old male who presented to the ED on 6/29 from [facility] with AMS and hypotension. 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 Pt admitted with septic shock, acute on chronic hypoxemic respiratory failure.  6/29 admitted to ICU for septic shock on levo to maintain MAP>65, continued on vent support 6/29 Blood Cx>> staph species in 1 of 4 cultures drawn>> suspect contaminant 6/30 Off pressors 7/2 ID Consult for MDR acinetobacter in trach asp, Meropenem changed to unasyn 7/2 PRBC transfusion, iron replacement 7/5 Pressors added again. Central line placed. Drainage from gastrostomy tube >> CT Abd without fluid collection around gastrostomy tube but with diffuse body wall edema. Echo EF 50-55%, mild LVH 7/6 Renal Replacement Therapy ; Code status changed to partial Code (DNR) 7/7 Transfuse 1U PRBC 7/9 add solu cortef 7/10 off pressors,  weaning stress steroids 7/11 Pressors added back on again 7/12 ID consult due to rising WBC, hypothermia, hypotension concern for worsening infection. Vancomycin added. Repeat blood cx collected. 7/15 CRRT stopped 7/17 Restarted on low dose levo. No evidence of new infection. Held off on antibiotics. Nephrology contacted family and decision was made to continue iHD this week to allow additional time for renal recovery. Patient is not a long term iHD candidate 7/18 plans for iHD, tolerated with 2L removed 7/19 slight drop in hgb to 6.9 will transfuse 1 unit PRBC 7/20 To [facility], PCCM following for trach / vent needs 7/22>> tolerating HD, will need out of state placement 7/26- s/p tunneled HD cath placement  Patient is currently intubated on ventilator via trach.  MV: 11.4 L/min Temp (24hrs), Avg:98.2 F (36.8 C), Min:97.7 F (36.5 C), Max:98.7 F (37.1 C)  Reviewed I/O's: -860 ml x 24 hours and -5.2 L since 07/30/20  Colostomy output: 275 ml x 24 hours  Receiving Nepro at 45 ml/h with Prosource TF 45 ml TID via PEG to provide 2064 kcal, 120 gm protein, 785 ml free water daily.   Palliative care continues for goals of care discussion. Family would like to continue with aggressive care, including continuing HD. TOC team searching for accepting SNF, which will be out of state secondary to pt's complex medical needs.   Admission wt: 90.2 kg Today's wt: 75.4 kg  Suspect wt changes are related to fluid losses from HD.    Medications reviewed and include colace, aranesp, and miralax.   Labs reviewed: Na: 133, CBGS: 15 (inpatient orders for  glycemic control are 2 units insulin aspart every 4 hours and 10 units insulin glargine daily).    Diet Order:   Diet Order             Diet NPO time specified  Diet effective midnight                   EDUCATION NEEDS:   No education needs have been identified at this time  Skin:  Skin Assessment: Skin Integrity Issues: Skin Integrity  Issues:: Stage I DTI: sacrum, L ear Stage I: R ankle Stage IV: sacrum  Last BM:  08/12/20 (via colostomy)  Height:   Ht Readings from Last 1 Encounters:  07/31/20 5' 6"  (1.676 m)    Weight:   Wt Readings from Last 1 Encounters:  08/13/20 75.4 kg    Ideal Body Weight:  58 kg (adjusted for quadriplegia)  BMI:  Body mass index is 26.83 kg/m.  Estimated Nutritional Needs:   Kcal:  1900-2100  Protein:  110-120 gm  Fluid:  1 L + UOP    Kyndel Egger W, RD, LDN, CDCES Registered Dietitian II Certified Diabetes Care and Education Specialist Please refer to [facility] for RD and/or RD on-call/weekend/after hours pager

## 2020-08-13 NOTE — Progress Notes (Signed)
[facility] Progress Note     Assessment/ Plan:   1) AKI -baseline Cr 0.4. AKI due to ATN /shock. CRRT initiated on 7/6 for anasarca with ineffective diuresis and uremia.  Extensive conversations in the past with palliative care as well as nephrology talking with family.  Family wishes for aggressive care with ongoing dialysis and eventual plans for possible transfer out of state -Now with TDC: Appreciate help from IR -Continue with volume removal with dialysis -MWF schedule for now -Receiving midodrine and albumin with dialysis   2) Hyperkalemia resolved with dialysis   3) CKD-MBD, hyperphosphatemia -Continue sevelamer   4) Acute metabolic encephalopathy, likely related to sepsis and uremia:  CT head unrevealing, seems to be improving   5) Septic shock:  resolved -off pressors now -off abx as of 7/15   6) Chronic respiratory failure s/p trach -vent mgmt per ccm   7) 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 give iron load as severely iron deficient on 6/30 labs (t sat 5%) - last given blood on 7/19. on ESA    Subjective:   Stable overnight without significant changes   Objective:   BP 125/80   Pulse 69   Temp 98.7 F (37.1 C) (Oral)   Resp (!) 30   Ht 5' 6"  (1.676 m)   Wt 75.4 kg   SpO2 99%   BMI 26.83 kg/m   Intake/Output Summary (Last 24 hours) at 08/13/2020 1057 Last data filed at 08/13/2020 1028 Gross per 24 hour  Intake 1515 ml  Output 275 ml  Net 1240 ml   Weight change:   Physical Exam: Gen: chronically ill appearing, lying in bed CVS: No murmurs, normal rate Resp:trached, bl chest expansion, on vent,  ONG:EXBM Ext:  2+ diffuse edema  Neuro: functional quadriplegic, eyes closed  Imaging: No results found.  Labs: BMET Recent Labs  Lab 08/07/20 0906  08/08/20 0220 08/09/20 0022 08/10/20 0111 08/11/20 0023 08/12/20 0235 08/13/20 0647  NA 134* 134* 133* 134* 134* 132* 133*  K 4.3 4.0 4.0 3.0* 3.6 4.8 3.5  CL 94* 98 95* 97* 95* 95* 93*  CO2 25 22 24 27 25  19* 26  GLUCOSE 114* 171* 152* 221* 166* 162* 148*  BUN 139* 72* 120* 72* 128* 178* 127*  CREATININE 1.53* 1.01 1.33* 0.88 1.28* 1.62* 1.26*  CALCIUM 8.8* 8.6* 9.1 9.5 9.6 9.9 9.9  PHOS 7.1* 4.9* 5.5* 3.4 4.6 5.7* 4.3   CBC Recent Labs  Lab 08/09/20 0022 08/11/20 0023 08/12/20 0235 08/13/20 0455  WBC 14.0* 15.5* 15.2* 15.1*  NEUTROABS 11.6* 12.6* 11.7* 12.2*  HGB 8.7* 8.8* 9.6* 10.2*  HCT 26.6* 27.9* 30.2* 32.4*  MCV 82.9 85.1 83.9 83.9  PLT 304 302 301 187    Medications:     amiodarone  200 mg Per Tube Daily   apixaban  5 mg Per Tube BID   buPROPion  75 mg Per Tube BID   chlorhexidine gluconate (MEDLINE KIT)  15 mL Mouth Rinse BID   Chlorhexidine Gluconate Cloth  6 each Topical Q0600   darbepoetin (ARANESP) injection - DIALYSIS  150 mcg Intravenous Q Wed-HD   docusate  100 mg Per Tube BID   feeding supplement (PROSource TF)  45 mL Per Tube TID   guaiFENesin  5 mL Per Tube BID   insulin aspart  2 Units  Subcutaneous Q4H   insulin glargine  10 Units Subcutaneous Daily   mouth rinse  15 mL Mouth Rinse 10 times per day   midodrine  10 mg Per Tube TID   multivitamin  1 tablet Per Tube QHS   nutrition supplement (JUVEN)  1 packet Per Tube BID BM   pantoprazole sodium  40 mg Per Tube QHS   polyethylene glycol  17 g Per Tube Daily   sertraline  100 mg Per Tube Daily   sodium chloride flush  10-40 mL Intracatheter Q12H     Reesa Chew  08/13/2020, 10:57 AM

## 2020-08-13 NOTE — TOC Progression Note (Signed)
Transition of Care [facility]) - Progression Note    Patient Details  Name: Luis Orr MRN: [id number] Date of Birth: February 10, 1950  Transition of Care [facility]) CM/SW Contact  Milinda Antis, [place] Phone Number: 08/13/2020, 10:48 AM  Clinical Narrative:    10:40-  CSW called [facility] in [place] to inquire about whether they will be able to accept the patient.  CSW was informed that the referral was received and the admissions supervisor is reviewing it.  The decision is still pending.      Expected Discharge Plan: [place] Barriers to Discharge: Continued Medical Work up  Expected Discharge Plan and Services Expected Discharge Plan: [place] arrangements for the past 2 months: Post-Acute Facility                                       Social Determinants of Health (SDOH) Interventions    Readmission Risk Interventions No flowsheet data found.

## 2020-08-13 NOTE — Progress Notes (Signed)
Patient ID: Luis Orr, male   DOB: 1950/07/09, 70 y.o.   MRN: [id number]  PROGRESS NOTE    Luis Orr  [id number] DOB: Apr 12, 1950 DOA: 07/09/2020 PCP: Townsend Roger, MD   Brief Narrative:  70 y.o. male from [facility] with hx of C3-C5 compressive myelopathy with functional quadriplegia and chronic trach/vent presented secondary to altered mental status and hypotension with recurrent pneumonia requiring vasopressor support and ICU admission.  ICU significant events: 6/29 admitted to ICU for septic shock on levo to maintain MAP>65, continued on vent support 6/29 Blood Cx>> staph species in 1 of 4 cultures drawn>> suspect contaminant 6/30 Off pressors 7/2 ID Consult for MDR acinetobacter in trach asp, Meropenem changed to unasyn 7/2 PRBC transfusion, iron replacement 7/5 Pressors added again. Central line placed. Drainage from gastrostomy tube >> CT Abd without fluid collection around gastrostomy tube but with diffuse body wall edema. Echo EF 50-55%, mild LVH 7/6 Renal Replacement Therapy ; Code status changed to partial Code (DNR) 7/7 Transfuse 1U PRBC 7/9 add solu cortef 7/10 off pressors, weaning stress steroids 7/11 Pressors added back on again 7/12 ID consult due to rising WBC, hypothermia, hypotension concern for worsening infection. Vancomycin added. Repeat blood cx collected. 7/15 CRRT stopped 7/17 Restarted on low dose levo. No evidence of new infection. Held off on antibiotics. Nephrology contacted family and decision was made to continue iHD this week to allow additional time for renal recovery. Patient is not a long term iHD candidate 7/18 plans for iHD, tolerated with 2L removed 7/19 slight drop in hgb to 6.9 will transfuse 1 unit PRBC 7/22>7/26: Tolerating HD. TOC consulted for placement. Will likely need out of state placement.   He was subsequently transferred to [facility] service.  Assessment & Plan:   Acute on chronic hypoxic and hypercapnic respiratory  failure -Secondary to pneumonia/volume overload. Now resolved and back to baseline. Currently on ventilator support per PCCM and is at baseline.  PCCM following intermittently  Ventilator associated pneumonia -Present on admission. Sputum culture significant for acinetobacter baumannii.  -Patient was treated with Unasyn x14 days. Completed course.   Leukocytosis -Secondary to above -monitor  AKI with oliguria -Now appears patient is anuric.  -Nephrology following: Was initially on CRRT and currently on intermittent HD.  Issues with HD are that patient cannot go back to his SNF. Palliative care has been consulted and are following. [place] placed on 08/07/2020 -Patient is a poor candidate for long-term dialysis but family wants to continue intermittent dialysis for now. -Family wants to continue aggressive care at this time.   Septic shock: Resolved -Present on admission. Secondary to pneumonia. Patient required vasopressor support which is now weaned off. Resolved. Initial blood culture significant for likely contaminant. Repeat blood cultures negative.   Acute metabolic encephalopathy -Secondary to acute illness. CT head negative for acute process. Appears improved and likely back at baseline.   Diabetes mellitus, type 2 with hyperglycemia -Insulin needs have decreased. -Continue Lantus and Novolog    Vasoplegia -Continue midodrine    Shock liver -Secondary to septic shock. Improving.   Quadriplegia -Secondary to compressive myelopathy. Patient resides at [facility] but per chart review, [facility] will not accept him back if he is HD dependent. -Education officer, museum following   Microcytic anemia Acute anemia Patient required 4 units of PRBC during this hospitalization.  Thought to be secondary to a combination of acute illness, chronic disease and iron deficiency. IV iron given.  Hemoglobin currently stable.   Severe malnutrition -Continue tube feeds  Paroxysmal atrial  fibrillation -Continue amiodarone   Pressure injury : Medial/posterior sacrum present on admission. Right/posterior/lateral ankle unknown if present on admission. Left ear, not present on admission.  -Continue local wound care  Generalized deconditioning -Overall prognosis is very poor.  Family wants to continue current treatment including trach/vent and intermittent hemodialysis.   DVT prophylaxis: Heparin Code Status:   Code Status: Partial Code Family Communication: None at bedside Disposition Plan: Status is: Inpatient  Remains inpatient appropriate because:Inpatient level of care appropriate due to severity of illness  Dispo: The patient is from:  [facility]              Anticipated d/c is to: SNF              Patient currently is not medically stable to d/c.   Difficult to place patient Yes  Consultants:  PCCM Nephrology Infectious disease Palliative care medicine  Procedures: CRRT followed by intermittent hemodialysis.  [place] placed on 08/07/2020  Antimicrobials:  Anti-infectives (From admission, onward)    Start     Dose/Rate Route Frequency Ordered Stop   08/07/20 1012  ceFAZolin (ANCEF) IVPB 2g/100 mL premix        over 30 Minutes Intravenous Continuous PRN 08/07/20 1023 08/07/20 1012   08/07/20 0000  ceFAZolin (ANCEF) IVPB 2g/100 mL premix        2 g 200 mL/hr over 30 Minutes Intravenous To Radiology 08/05/20 1244 08/07/20 0013   08/06/20 0000  ceFAZolin (ANCEF) IVPB 2g/100 mL premix  Status:  Discontinued        2 g 200 mL/hr over 30 Minutes Intravenous To Radiology 08/05/20 1201 08/05/20 1244   07/25/20 1700  vancomycin (VANCOCIN) IVPB 1000 mg/200 mL premix  Status:  Discontinued        1,000 mg 200 mL/hr over 60 Minutes Intravenous Every 24 hours 07/24/20 1606 07/27/20 0815   07/24/20 1700  vancomycin (VANCOREADY) IVPB 1750 mg/350 mL        1,750 mg 175 mL/hr over 120 Minutes Intravenous  Once 07/24/20 1606 07/24/20 2005   07/19/20 1000  Ampicillin-Sulbactam  (UNASYN) 3 g in sodium chloride 0.9 % 100 mL IVPB        3 g 200 mL/hr over 30 Minutes Intravenous Every 6 hours 07/19/20 0848 07/28/20 0342   07/18/20 1200  Ampicillin-Sulbactam (UNASYN) 3 g in sodium chloride 0.9 % 100 mL IVPB  Status:  Discontinued        3 g 200 mL/hr over 30 Minutes Intravenous Every 8 hours 07/18/20 0720 07/19/20 0848   07/14/20 1530  Ampicillin-Sulbactam (UNASYN) 3 g in sodium chloride 0.9 % 100 mL IVPB  Status:  Discontinued        3 g 200 mL/hr over 30 Minutes Intravenous Every 6 hours 07/14/20 1440 07/18/20 0720   07/13/20 2000  meropenem (MERREM) 2 g in sodium chloride 0.9 % 100 mL IVPB  Status:  Discontinued        2 g 200 mL/hr over 30 Minutes Intravenous Every 12 hours 07/13/20 1433 07/14/20 1440   07/13/20 1432  vancomycin variable dose per unstable renal function (pharmacist dosing)  Status:  Discontinued         Does not apply See admin instructions 07/13/20 1433 07/15/20 0937   07/13/20 1400  vancomycin (VANCOCIN) IVPB 750 mg/150 ml premix  Status:  Discontinued        750 mg 150 mL/hr over 60 Minutes Intravenous Every 12 hours 07/13/20 1321 07/13/20 1430   07/12/20 1248  vancomycin (VANCOREADY) IVPB 750 mg/150 mL  Status:  Discontinued        750 mg 150 mL/hr over 60 Minutes Intravenous Every 12 hours 07/12/20 1248 07/13/20 1321   06/23/2020 2300  meropenem (MERREM) 2 g in sodium chloride 0.9 % 100 mL IVPB  Status:  Discontinued        2 g 200 mL/hr over 30 Minutes Intravenous Every 8 hours 07/02/2020 2209 07/13/20 1433   07/09/2020 2205  vancomycin variable dose per unstable renal function (pharmacist dosing)  Status:  Discontinued         Does not apply See admin instructions 07/08/2020 2209 07/12/20 1248   06/23/2020 1845  vancomycin (VANCOREADY) IVPB 1500 mg/300 mL        1,500 mg 150 mL/hr over 120 Minutes Intravenous  Once 07/09/2020 1843 06/25/2020 2132   06/28/2020 1845  ceFEPIme (MAXIPIME) 2 g in sodium chloride 0.9 % 100 mL IVPB        2 g 200 mL/hr over 30  Minutes Intravenous  Once 07/08/2020 1843 06/25/2020 1952        Subjective: Patient seen and examined at bedside.  No overnight seizures, agitation vomiting or fever reported.   Objective: Vitals:   08/13/20 0200 08/13/20 0300 08/13/20 0332 08/13/20 0500  BP: 109/69 128/70    Pulse: 70 69    Resp: (!) 30 (!) 30    Temp:   97.9 F (36.6 C)   TempSrc:   Oral   SpO2: 97% 98%    Weight:    75.4 kg  Height:        Intake/Output Summary (Last 24 hours) at 08/13/2020 0715 Last data filed at 08/12/2020 1800 Gross per 24 hour  Intake --  Output 2275 ml  Net -2275 ml    Filed Weights   08/10/20 0500 08/11/20 0500 08/13/20 0500  Weight: 74.1 kg 74.9 kg 75.4 kg    Examination:  General exam:  Appears chronically ill and deconditioned.  No acute distress.   ENT: On ventilator via trach  respiratory system: Decreased breath sounds at bases with scattered crackles  cardiovascular system: Rate controlled, S1-S2 heard gastrointestinal system: Abdomen is mildly distended, soft and nontender.  Bowel sounds are heard.  PEG tube present. Extremities: Bilateral lower extremity edema present; no clubbing Central nervous system: Wakes up slightly.  Quadriplegia present.   Skin: No obvious petechiae/rashes psychiatry: Cannot assess because of mental status  Data Reviewed: I have personally reviewed following labs and imaging studies  CBC: Recent Labs  Lab 08/08/20 0431 08/09/20 0022 08/11/20 0023 08/12/20 0235  WBC 18.3* 14.0* 15.5* 15.2*  NEUTROABS  --  11.6* 12.6* 11.7*  HGB 8.6* 8.7* 8.8* 9.6*  HCT 27.0* 26.6* 27.9* 30.2*  MCV 83.3 82.9 85.1 83.9  PLT 315 304 302 671    Basic Metabolic Panel: Recent Labs  Lab 08/08/20 0220 08/09/20 0022 08/10/20 0111 08/11/20 0023 08/12/20 0235  NA 134* 133* 134* 134* 132*  K 4.0 4.0 3.0* 3.6 4.8  CL 98 95* 97* 95* 95*  CO2 22 24 27 25  19*  GLUCOSE 171* 152* 221* 166* 162*  BUN 72* 120* 72* 128* 178*  CREATININE 1.01 1.33* 0.88 1.28*  1.62*  CALCIUM 8.6* 9.1 9.5 9.6 9.9  MG 2.1 2.1 2.0 2.1 2.4  PHOS 4.9* 5.5* 3.4 4.6 5.7*    GFR: Estimated Creatinine Clearance: 38.3 mL/min (A) (by C-G formula based on SCr of 1.62 mg/dL (H)). Liver Function Tests: Recent Labs  Lab 08/08/20 0220  08/09/20 0022 08/10/20 0111 08/11/20 0023 08/12/20 0235  ALBUMIN 2.3* 2.3* 2.8* 2.6* 2.6*    No results for input(s): LIPASE, AMYLASE in the last 168 hours. No results for input(s): AMMONIA in the last 168 hours. Coagulation Profile: No results for input(s): INR, PROTIME in the last 168 hours. Cardiac Enzymes: No results for input(s): CKTOTAL, CKMB, CKMBINDEX, TROPONINI in the last 168 hours. BNP (last 3 results) No results for input(s): PROBNP in the last 8760 hours. HbA1C: No results for input(s): HGBA1C in the last 72 hours. CBG: Recent Labs  Lab 08/12/20 1117 08/12/20 1538 08/12/20 1949 08/12/20 2324 08/13/20 0323  GLUCAP 154* 178* 160* 163* 148*    Lipid Profile: No results for input(s): CHOL, HDL, LDLCALC, TRIG, CHOLHDL, LDLDIRECT in the last 72 hours. Thyroid Function Tests: No results for input(s): TSH, T4TOTAL, FREET4, T3FREE, THYROIDAB in the last 72 hours. Anemia Panel: No results for input(s): VITAMINB12, FOLATE, FERRITIN, TIBC, IRON, RETICCTPCT in the last 72 hours. Sepsis Labs: No results for input(s): PROCALCITON, LATICACIDVEN in the last 168 hours.  No results found for this or any previous visit (from the past 240 hour(s)).       Radiology Studies: No results found.      Scheduled Meds:  amiodarone  200 mg Per Tube Daily   apixaban  5 mg Per Tube BID   buPROPion  75 mg Per Tube BID   chlorhexidine gluconate (MEDLINE KIT)  15 mL Mouth Rinse BID   Chlorhexidine Gluconate Cloth  6 each Topical Q0600   darbepoetin (ARANESP) injection - DIALYSIS  150 mcg Intravenous Q Wed-HD   docusate  100 mg Per Tube BID   feeding supplement (PROSource TF)  45 mL Per Tube TID   guaiFENesin  5 mL Per Tube  BID   insulin aspart  2 Units Subcutaneous Q4H   insulin glargine  10 Units Subcutaneous Daily   mouth rinse  15 mL Mouth Rinse 10 times per day   midodrine  10 mg Per Tube TID   multivitamin  1 tablet Per Tube QHS   nutrition supplement (JUVEN)  1 packet Per Tube BID BM   pantoprazole sodium  40 mg Per Tube QHS   polyethylene glycol  17 g Per Tube Daily   sertraline  100 mg Per Tube Daily   sodium chloride flush  10-40 mL Intracatheter Q12H   Continuous Infusions:  sodium chloride Stopped (07/26/20 0824)   sodium chloride 250 mL (07/31/20 1428)   sodium chloride     sodium chloride     feeding supplement (NEPRO CARB STEADY) 1,000 mL (08/12/20 1735)          Aline August, MD [facility] 08/13/2020, 7:15 AM

## 2020-08-14 DIAGNOSIS — N179 Acute kidney failure, unspecified: Secondary | ICD-10-CM | POA: Diagnosis not present

## 2020-08-14 DIAGNOSIS — I959 Hypotension, unspecified: Secondary | ICD-10-CM | POA: Diagnosis not present

## 2020-08-14 DIAGNOSIS — J9621 Acute and chronic respiratory failure with hypoxia: Secondary | ICD-10-CM

## 2020-08-14 DIAGNOSIS — G8250 Quadriplegia, unspecified: Secondary | ICD-10-CM | POA: Diagnosis not present

## 2020-08-14 LAB — CBC WITH DIFFERENTIAL/PLATELET
Abs Immature Granulocytes: 0.29 10*3/uL — ABNORMAL HIGH (ref 0.00–0.07)
Basophils Absolute: 0.1 10*3/uL (ref 0.0–0.1)
Basophils Relative: 0 %
Eosinophils Absolute: 0.1 10*3/uL (ref 0.0–0.5)
Eosinophils Relative: 1 %
HCT: 35.8 % — ABNORMAL LOW (ref 39.0–52.0)
Hemoglobin: 11.5 g/dL — ABNORMAL LOW (ref 13.0–17.0)
Immature Granulocytes: 2 %
Lymphocytes Relative: 6 %
Lymphs Abs: 1 10*3/uL (ref 0.7–4.0)
MCH: 26.5 pg (ref 26.0–34.0)
MCHC: 32.1 g/dL (ref 30.0–36.0)
MCV: 82.5 fL (ref 80.0–100.0)
Monocytes Absolute: 1.2 10*3/uL — ABNORMAL HIGH (ref 0.1–1.0)
Monocytes Relative: 7 %
Neutro Abs: 14.7 10*3/uL — ABNORMAL HIGH (ref 1.7–7.7)
Neutrophils Relative %: 84 %
Platelets: 302 10*3/uL (ref 150–400)
RBC: 4.34 MIL/uL (ref 4.22–5.81)
RDW: 22.1 % — ABNORMAL HIGH (ref 11.5–15.5)
WBC: 17.5 10*3/uL — ABNORMAL HIGH (ref 4.0–10.5)
nRBC: 1.9 % — ABNORMAL HIGH (ref 0.0–0.2)

## 2020-08-14 LAB — GLUCOSE, CAPILLARY
Glucose-Capillary: 163 mg/dL — ABNORMAL HIGH (ref 70–99)
Glucose-Capillary: 174 mg/dL — ABNORMAL HIGH (ref 70–99)
Glucose-Capillary: 185 mg/dL — ABNORMAL HIGH (ref 70–99)
Glucose-Capillary: 187 mg/dL — ABNORMAL HIGH (ref 70–99)
Glucose-Capillary: 192 mg/dL — ABNORMAL HIGH (ref 70–99)
Glucose-Capillary: 192 mg/dL — ABNORMAL HIGH (ref 70–99)
Glucose-Capillary: 196 mg/dL — ABNORMAL HIGH (ref 70–99)

## 2020-08-14 LAB — RENAL FUNCTION PANEL
Albumin: 3.4 g/dL — ABNORMAL LOW (ref 3.5–5.0)
Anion gap: 17 — ABNORMAL HIGH (ref 5–15)
BUN: 40 mg/dL — ABNORMAL HIGH (ref 8–23)
CO2: 25 mmol/L (ref 22–32)
Calcium: 10.2 mg/dL (ref 8.9–10.3)
Chloride: 92 mmol/L — ABNORMAL LOW (ref 98–111)
Creatinine, Ser: 0.65 mg/dL (ref 0.61–1.24)
GFR, Estimated: >60 mL/min (ref >60)
Glucose, Bld: 178 mg/dL — ABNORMAL HIGH (ref 70–99)
Phosphorus: 2.2 mg/dL — ABNORMAL LOW (ref 2.5–4.6)
Potassium: 3 mmol/L — ABNORMAL LOW (ref 3.5–5.1)
Sodium: 134 mmol/L — ABNORMAL LOW (ref 135–145)

## 2020-08-14 LAB — PATHOLOGIST SMEAR REVIEW

## 2020-08-14 LAB — MAGNESIUM: Magnesium: 2.1 mg/dL (ref 1.7–2.4)

## 2020-08-14 MED ORDER — POTASSIUM & SODIUM PHOSPHATES 280-160-250 MG PO PACK
2.0000 | PACK | Freq: Once | ORAL | Status: AC
  Administered 2020-08-14: 2
  Filled 2020-08-14: qty 2

## 2020-08-14 MED ORDER — POTASSIUM CHLORIDE 20 MEQ PO PACK
20.0000 meq | PACK | Freq: Once | ORAL | Status: AC
  Administered 2020-08-14: 20 meq via ORAL
  Filled 2020-08-14: qty 1

## 2020-08-14 MED ORDER — DARBEPOETIN ALFA 100 MCG/0.5ML IJ SOSY
100.0000 ug | PREFILLED_SYRINGE | INTRAMUSCULAR | Status: DC
  Filled 2020-08-14: qty 0.5

## 2020-08-14 NOTE — TOC Progression Note (Signed)
Transition of Care [facility]) - Progression Note    Patient Details  Name: Luis Orr MRN: [id number] Date of Birth: 1950/11/25  [facility] CM/SW Contact  Milinda Antis, [place] Phone Number: 08/14/2020, 10:08 AM  Clinical Narrative:     10:08-  CSW called [facility] in [place] [phone]) to inquire about whether they will be able to accept the patient.  There was no answer.  CSW left a VM requesting a returned call.    Expected Discharge Plan: [place] Barriers to Discharge: Continued Medical Work up  Expected Discharge Plan and Services Expected Discharge Plan: [place] arrangements for the past 2 months: Post-Acute Facility                                       Social Determinants of Health (SDOH) Interventions    Readmission Risk Interventions No flowsheet data found.

## 2020-08-14 NOTE — Progress Notes (Signed)
Patient ID: Luis Orr, male   DOB: 04-11-1950, 70 y.o.   MRN: [id number]  PROGRESS NOTE    Luis Orr  XMI:[id number] DOB: 07-29-1950 DOA: 07/12/2020 PCP: Townsend Roger, MD   Brief Narrative:  70 y.o. male from [facility] with hx of C3-C5 compressive myelopathy with functional quadriplegia and chronic trach/vent presented secondary to altered mental status and hypotension with recurrent pneumonia requiring vasopressor support and ICU admission.  ICU significant events: 6/29 admitted to ICU for septic shock on levo to maintain MAP>65, continued on vent support 6/29 Blood Cx>> staph species in 1 of 4 cultures drawn>> suspect contaminant 6/30 Off pressors 7/2 ID Consult for MDR acinetobacter in trach asp, Meropenem changed to unasyn 7/2 PRBC transfusion, iron replacement 7/5 Pressors added again. Central line placed. Drainage from gastrostomy tube >> CT Abd without fluid collection around gastrostomy tube but with diffuse body wall edema. Echo EF 50-55%, mild LVH 7/6 Renal Replacement Therapy ; Code status changed to partial Code (DNR) 7/7 Transfuse 1U PRBC 7/9 add solu cortef 7/10 off pressors, weaning stress steroids 7/11 Pressors added back on again 7/12 ID consult due to rising WBC, hypothermia, hypotension concern for worsening infection. Vancomycin added. Repeat blood cx collected. 7/15 CRRT stopped 7/17 Restarted on low dose levo. No evidence of new infection. Held off on antibiotics. Nephrology contacted family and decision was made to continue iHD this week to allow additional time for renal recovery. Patient is not a long term iHD candidate 7/18 plans for iHD, tolerated with 2L removed 7/19 slight drop in hgb to 6.9 will transfuse 1 unit PRBC 7/22>7/26: Tolerating HD. TOC consulted for placement. Will likely need out of state placement.   He was subsequently transferred to [facility] service.  Assessment & Plan:   Acute on chronic hypoxic and hypercapnic respiratory  failure -Secondary to pneumonia/volume overload. Now resolved and back to baseline. Currently on ventilator support per PCCM and is at baseline.  PCCM following intermittently  Ventilator associated pneumonia -Present on admission. Sputum culture significant for acinetobacter baumannii.  -Patient was treated with Unasyn x14 days. Completed course.   Leukocytosis -Secondary to above -Worsening, monitor  AKI with oliguria -Now appears patient is anuric.  -Nephrology following: Was initially on CRRT and currently on intermittent HD.  Issues with HD are that patient cannot go back to his SNF. Palliative care has been consulted and are following. [place] placed on 08/07/2020 -Patient is a poor candidate for long-term dialysis but family wants to continue intermittent dialysis for now. -Family wants to continue aggressive care at this time.   Septic shock: Resolved -Present on admission. Secondary to pneumonia. Patient required vasopressor support which is now weaned off. Resolved. Initial blood culture significant for likely contaminant. Repeat blood cultures negative.   Acute metabolic encephalopathy -Secondary to acute illness. CT head negative for acute process. Appears improved and likely back at baseline.   Diabetes mellitus, type 2 with hyperglycemia -Insulin needs have decreased. -Continue Lantus and Novolog    Vasoplegia -Continue midodrine    Shock liver -Secondary to septic shock. Improving.   Quadriplegia -Secondary to compressive myelopathy. Patient resides at [facility] but per chart review, [facility] will not accept him back if he is HD dependent. -Education officer, museum following   Microcytic anemia Acute anemia Patient required 4 units of PRBC during this hospitalization.  Thought to be secondary to a combination of acute illness, chronic disease and iron deficiency. IV iron given.  Hemoglobin currently stable.   Severe malnutrition -Continue tube  feeds   Paroxysmal atrial  fibrillation -Continue amiodarone   Pressure injury : Medial/posterior sacrum present on admission. Right/posterior/lateral ankle unknown if present on admission. Left ear, not present on admission.  -Continue local wound care  Generalized deconditioning -Overall prognosis is very poor.  Family wants to continue current treatment including trach/vent and intermittent hemodialysis.   DVT prophylaxis: Heparin Code Status:   Code Status: Partial Code Family Communication: None at bedside Disposition Plan: Status is: Inpatient  Remains inpatient appropriate because:Inpatient level of care appropriate due to severity of illness  Dispo: The patient is from:  [facility]              Anticipated d/c is to: SNF              Patient currently is not medically stable to d/c.   Difficult to place patient Yes  Consultants:  PCCM Nephrology Infectious disease Palliative care medicine  Procedures: CRRT followed by intermittent hemodialysis.  [place] placed on 08/07/2020  Antimicrobials:  Anti-infectives (From admission, onward)    Start     Dose/Rate Route Frequency Ordered Stop   08/07/20 1012  ceFAZolin (ANCEF) IVPB 2g/100 mL premix        over 30 Minutes Intravenous Continuous PRN 08/07/20 1023 08/07/20 1012   08/07/20 0000  ceFAZolin (ANCEF) IVPB 2g/100 mL premix        2 g 200 mL/hr over 30 Minutes Intravenous To Radiology 08/05/20 1244 08/07/20 0013   08/06/20 0000  ceFAZolin (ANCEF) IVPB 2g/100 mL premix  Status:  Discontinued        2 g 200 mL/hr over 30 Minutes Intravenous To Radiology 08/05/20 1201 08/05/20 1244   07/25/20 1700  vancomycin (VANCOCIN) IVPB 1000 mg/200 mL premix  Status:  Discontinued        1,000 mg 200 mL/hr over 60 Minutes Intravenous Every 24 hours 07/24/20 1606 07/27/20 0815   07/24/20 1700  vancomycin (VANCOREADY) IVPB 1750 mg/350 mL        1,750 mg 175 mL/hr over 120 Minutes Intravenous  Once 07/24/20 1606 07/24/20 2005   07/19/20 1000  Ampicillin-Sulbactam  (UNASYN) 3 g in sodium chloride 0.9 % 100 mL IVPB        3 g 200 mL/hr over 30 Minutes Intravenous Every 6 hours 07/19/20 0848 07/28/20 0342   07/18/20 1200  Ampicillin-Sulbactam (UNASYN) 3 g in sodium chloride 0.9 % 100 mL IVPB  Status:  Discontinued        3 g 200 mL/hr over 30 Minutes Intravenous Every 8 hours 07/18/20 0720 07/19/20 0848   07/14/20 1530  Ampicillin-Sulbactam (UNASYN) 3 g in sodium chloride 0.9 % 100 mL IVPB  Status:  Discontinued        3 g 200 mL/hr over 30 Minutes Intravenous Every 6 hours 07/14/20 1440 07/18/20 0720   07/13/20 2000  meropenem (MERREM) 2 g in sodium chloride 0.9 % 100 mL IVPB  Status:  Discontinued        2 g 200 mL/hr over 30 Minutes Intravenous Every 12 hours 07/13/20 1433 07/14/20 1440   07/13/20 1432  vancomycin variable dose per unstable renal function (pharmacist dosing)  Status:  Discontinued         Does not apply See admin instructions 07/13/20 1433 07/15/20 0937   07/13/20 1400  vancomycin (VANCOCIN) IVPB 750 mg/150 ml premix  Status:  Discontinued        750 mg 150 mL/hr over 60 Minutes Intravenous Every 12 hours 07/13/20 1321 07/13/20 1430  07/12/20 1248  vancomycin (VANCOREADY) IVPB 750 mg/150 mL  Status:  Discontinued        750 mg 150 mL/hr over 60 Minutes Intravenous Every 12 hours 07/12/20 1248 07/13/20 1321   07/10/2020 2300  meropenem (MERREM) 2 g in sodium chloride 0.9 % 100 mL IVPB  Status:  Discontinued        2 g 200 mL/hr over 30 Minutes Intravenous Every 8 hours 06/16/2020 2209 07/13/20 1433   07/06/2020 2205  vancomycin variable dose per unstable renal function (pharmacist dosing)  Status:  Discontinued         Does not apply See admin instructions 06/19/2020 2209 07/12/20 1248   07/01/2020 1845  vancomycin (VANCOREADY) IVPB 1500 mg/300 mL        1,500 mg 150 mL/hr over 120 Minutes Intravenous  Once 07/07/2020 1843 06/28/2020 2132   07/10/2020 1845  ceFEPIme (MAXIPIME) 2 g in sodium chloride 0.9 % 100 mL IVPB        2 g 200 mL/hr over 30  Minutes Intravenous  Once 06/29/2020 1843 07/10/2020 1952        Subjective: Patient seen and examined at bedside.  No overnight fever, vomiting, agitation or seizures reported.   Objective: Vitals:   08/14/20 0400 08/14/20 0410 08/14/20 0423 08/14/20 0430  BP: (!) 163/91  (!) 163/91 (!) 148/103  Pulse: (!) 104  (!) 102 (!) 106  Resp: (!) 30   (!) 27  Temp:  97.9 F (36.6 C)    TempSrc:  Oral    SpO2: 98%  97% 96%  Weight:      Height:        Intake/Output Summary (Last 24 hours) at 08/14/2020 0716 Last data filed at 08/14/2020 0445 Gross per 24 hour  Intake 1040 ml  Output 2700 ml  Net -1660 ml    Filed Weights   08/10/20 0500 08/11/20 0500 08/13/20 0500  Weight: 74.1 kg 74.9 kg 75.4 kg    Examination:  General exam:  Appears chronically ill and deconditioned.  No distress.   ENT: Currently on vent via tracheostomy  respiratory system: Bilateral decreased breath sounds at bases with some crackles and tachypnea  cardiovascular system: S1-S2 heard, tachycardic  gastrointestinal system: Abdomen is distended slightly, soft and nontender.  Normal bowel sounds heard.  PEG tube present. Extremities: No cyanosis; lower extremity edema present bilaterally Central nervous system: Sleepy, wakes up slightly.  Quadriplegia present.   Skin: No obvious ecchymosis/lesions  psychiatry: Could not be assessed because of mental status  Data Reviewed: I have personally reviewed following labs and imaging studies  CBC: Recent Labs  Lab 08/09/20 0022 08/11/20 0023 08/12/20 0235 08/13/20 0455 08/14/20 0444  WBC 14.0* 15.5* 15.2* 15.1* 17.5*  NEUTROABS 11.6* 12.6* 11.7* 12.2* 14.7*  HGB 8.7* 8.8* 9.6* 10.2* 11.5*  HCT 26.6* 27.9* 30.2* 32.4* 35.8*  MCV 82.9 85.1 83.9 83.9 82.5  PLT 304 302 301 187 462    Basic Metabolic Panel: Recent Labs  Lab 08/10/20 0111 08/11/20 0023 08/12/20 0235 08/13/20 0647 08/14/20 0444  NA 134* 134* 132* 133* 134*  K 3.0* 3.6 4.8 3.5 3.0*  CL 97*  95* 95* 93* 92*  CO2 27 25 19* 26 25  GLUCOSE 221* 166* 162* 148* 178*  BUN 72* 128* 178* 127* 40*  CREATININE 0.88 1.28* 1.62* 1.26* 0.65  CALCIUM 9.5 9.6 9.9 9.9 10.2  MG 2.0 2.1 2.4 2.2 2.1  PHOS 3.4 4.6 5.7* 4.3 2.2*    GFR: Estimated Creatinine Clearance:  77.5 mL/min (by C-G formula based on SCr of 0.65 mg/dL). Liver Function Tests: Recent Labs  Lab 08/10/20 0111 08/11/20 0023 08/12/20 0235 08/13/20 0647 08/14/20 0444  ALBUMIN 2.8* 2.6* 2.6* 2.7* 3.4*    No results for input(s): LIPASE, AMYLASE in the last 168 hours. No results for input(s): AMMONIA in the last 168 hours. Coagulation Profile: No results for input(s): INR, PROTIME in the last 168 hours. Cardiac Enzymes: No results for input(s): CKTOTAL, CKMB, CKMBINDEX, TROPONINI in the last 168 hours. BNP (last 3 results) No results for input(s): PROBNP in the last 8760 hours. HbA1C: No results for input(s): HGBA1C in the last 72 hours. CBG: Recent Labs  Lab 08/13/20 1109 08/13/20 1535 08/13/20 2015 08/13/20 2358 08/14/20 0412  GLUCAP 137* 155* 165* 174* 163*    Lipid Profile: No results for input(s): CHOL, HDL, LDLCALC, TRIG, CHOLHDL, LDLDIRECT in the last 72 hours. Thyroid Function Tests: No results for input(s): TSH, T4TOTAL, FREET4, T3FREE, THYROIDAB in the last 72 hours. Anemia Panel: No results for input(s): VITAMINB12, FOLATE, FERRITIN, TIBC, IRON, RETICCTPCT in the last 72 hours. Sepsis Labs: No results for input(s): PROCALCITON, LATICACIDVEN in the last 168 hours.  No results found for this or any previous visit (from the past 240 hour(s)).       Radiology Studies: No results found.      Scheduled Meds:  amiodarone  200 mg Per Tube Daily   apixaban  5 mg Per Tube BID   buPROPion  75 mg Per Tube BID   chlorhexidine gluconate (MEDLINE KIT)  15 mL Mouth Rinse BID   Chlorhexidine Gluconate Cloth  6 each Topical Q0600   darbepoetin (ARANESP) injection - DIALYSIS  150 mcg Intravenous Q  Wed-HD   docusate  100 mg Per Tube BID   feeding supplement (PROSource TF)  45 mL Per Tube TID   guaiFENesin  5 mL Per Tube BID   insulin aspart  2 Units Subcutaneous Q4H   insulin glargine  10 Units Subcutaneous Daily   mouth rinse  15 mL Mouth Rinse 10 times per day   midodrine  10 mg Per Tube TID   multivitamin  1 tablet Per Tube QHS   nutrition supplement (JUVEN)  1 packet Per Tube BID BM   pantoprazole sodium  40 mg Per Tube QHS   polyethylene glycol  17 g Per Tube Daily   potassium chloride  20 mEq Oral Once   sertraline  100 mg Per Tube Daily   sodium chloride flush  10-40 mL Intracatheter Q12H   Continuous Infusions:  sodium chloride Stopped (07/26/20 0824)   sodium chloride 250 mL (07/31/20 1428)   sodium chloride     sodium chloride     feeding supplement (NEPRO CARB STEADY) 1,000 mL (08/13/20 1748)          Aline August, MD [facility] 08/14/2020, 7:16 AM

## 2020-08-14 NOTE — Progress Notes (Signed)
[facility] Progress Note     Assessment/ Plan:   1) AKI -baseline Cr 0.4. AKI due to ATN /shock. CRRT initiated on 7/6 for anasarca with ineffective diuresis and uremia.  Extensive conversations in the past with palliative care as well as nephrology talking with family.  Family wishes for aggressive care with ongoing dialysis and eventual plans for possible transfer out of state -Now with [facility]DC: Appreciate help from IR -Continue with volume removal with dialysis -MWF schedule for now -Receiving midodrine and albumin with dialysis   2) hypokalemia: Replace today   3) CKD-MBD, hyperphosphatemia -Continue sevelamer   4) Acute metabolic encephalopathy, likely related to sepsis and uremia:  CT head unrevealing, seems to be improving   5) Septic shock:  resolved -off pressors now -off abx as of 7/15   6) Chronic respiratory failure s/p trach -vent mgmt per ccm   7) 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 give iron load as severely iron deficient on 6/30 labs (t sat 5%) most recent hemoglobin 11.5.  Decrease Aranesp to 100 MCG weekly - last given blood on 7/19. on ESA    Subjective:   No changes today.  Resting comfortably.  Tolerated dialysis yesterday with 2.5 L removed.   Objective:   BP (!) 173/85   Pulse 96   Temp 98.2 F (36.8 C) (Oral)   Resp (!) 29   Ht 5' 6"  (1.676 m)   Wt 74.2 kg   SpO2 98%   BMI 26.40 kg/m   Intake/Output Summary (Last 24 hours) at 08/14/2020 0941 Last data filed at 08/14/2020 0700 Gross per 24 hour  Intake 1490 ml  Output 2700 ml  Net -1210 ml   Weight change: -1.2 kg  Physical Exam: Gen: chronically ill appearing, lying in bed CVS: No murmurs, normal rate Resp:trached, bl chest expansion, on vent,  AXK:PVVZ Ext:  2+ diffuse edema  Neuro:  functional quadriplegic, eyes closed  Imaging: No results found.  Labs: BMET Recent Labs  Lab 08/08/20 0220 08/09/20 0022 08/10/20 0111 08/11/20 0023 08/12/20 0235 08/13/20 0647 08/14/20 0444  NA 134* 133* 134* 134* 132* 133* 134*  K 4.0 4.0 3.0* 3.6 4.8 3.5 3.0*  CL 98 95* 97* 95* 95* 93* 92*  CO2 22 24 27 25  19* 26 25  GLUCOSE 171* 152* 221* 166* 162* 148* 178*  BUN 72* 120* 72* 128* 178* 127* 40*  CREATININE 1.01 1.33* 0.88 1.28* 1.62* 1.26* 0.65  CALCIUM 8.6* 9.1 9.5 9.6 9.9 9.9 10.2  PHOS 4.9* 5.5* 3.4 4.6 5.7* 4.3 2.2*   CBC Recent Labs  Lab 08/11/20 0023 08/12/20 0235 08/13/20 0455 08/14/20 0444  WBC 15.5* 15.2* 15.1* 17.5*  NEUTROABS 12.6* 11.7* 12.2* 14.7*  HGB 8.8* 9.6* 10.2* 11.5*  HCT 27.9* 30.2* 32.4* 35.8*  MCV 85.1 83.9 83.9 82.5  PLT 302 301 187 302    Medications:     amiodarone  200 mg Per Tube Daily   apixaban  5 mg Per Tube BID   buPROPion  75 mg Per Tube BID   chlorhexidine gluconate (MEDLINE KIT)  15 mL Mouth Rinse BID   Chlorhexidine Gluconate Cloth  6 each Topical Q0600   darbepoetin (ARANESP) injection - DIALYSIS  150 mcg Intravenous Q Wed-HD   docusate  100 mg Per Tube BID   feeding supplement (PROSource TF)  45 mL Per Tube TID   guaiFENesin  5 mL Per Tube BID   insulin aspart  2 Units Subcutaneous Q4H   insulin glargine  10 Units Subcutaneous Daily   mouth rinse  15 mL Mouth Rinse 10 times per day   midodrine  10 mg Per Tube TID   multivitamin  1 tablet Per Tube QHS   nutrition supplement (JUVEN)  1 packet Per Tube BID BM   pantoprazole sodium  40 mg Per Tube QHS   polyethylene glycol  17 g Per Tube Daily   sertraline  100 mg Per Tube Daily   sodium chloride flush  10-40 mL Intracatheter Q12H     Reesa Chew  08/14/2020, 9:41 AM

## 2020-08-15 DIAGNOSIS — J9601 Acute respiratory failure with hypoxia: Secondary | ICD-10-CM | POA: Diagnosis not present

## 2020-08-15 DIAGNOSIS — A419 Sepsis, unspecified organism: Secondary | ICD-10-CM | POA: Diagnosis not present

## 2020-08-15 DIAGNOSIS — Z992 Dependence on renal dialysis: Secondary | ICD-10-CM

## 2020-08-15 DIAGNOSIS — G8250 Quadriplegia, unspecified: Secondary | ICD-10-CM | POA: Diagnosis not present

## 2020-08-15 DIAGNOSIS — N186 End stage renal disease: Secondary | ICD-10-CM | POA: Diagnosis not present

## 2020-08-15 LAB — GLUCOSE, CAPILLARY
Glucose-Capillary: 160 mg/dL — ABNORMAL HIGH (ref 70–99)
Glucose-Capillary: 202 mg/dL — ABNORMAL HIGH (ref 70–99)
Glucose-Capillary: 166 mg/dL — ABNORMAL HIGH (ref 70–99)
Glucose-Capillary: 175 mg/dL — ABNORMAL HIGH (ref 70–99)
Glucose-Capillary: 316 mg/dL — ABNORMAL HIGH (ref 70–99)
Glucose-Capillary: 251 mg/dL — ABNORMAL HIGH (ref 70–99)

## 2020-08-15 LAB — CBC WITH DIFFERENTIAL/PLATELET
Abs Immature Granulocytes: 0.3 10*3/uL — ABNORMAL HIGH (ref 0.00–0.07)
Basophils Absolute: 0.3 10*3/uL — ABNORMAL HIGH (ref 0.0–0.1)
Basophils Relative: 2 %
Eosinophils Absolute: 0.6 10*3/uL — ABNORMAL HIGH (ref 0.0–0.5)
Eosinophils Relative: 4 %
HCT: 31.5 % — ABNORMAL LOW (ref 39.0–52.0)
Hemoglobin: 9.8 g/dL — ABNORMAL LOW (ref 13.0–17.0)
Lymphocytes Relative: 10 %
Lymphs Abs: 1.6 10*3/uL (ref 0.7–4.0)
MCH: 26.3 pg (ref 26.0–34.0)
MCHC: 31.1 g/dL (ref 30.0–36.0)
MCV: 84.5 fL (ref 80.0–100.0)
Monocytes Absolute: 1.4 10*3/uL — ABNORMAL HIGH (ref 0.1–1.0)
Monocytes Relative: 9 %
Myelocytes: 2 %
Neutro Abs: 11.5 10*3/uL — ABNORMAL HIGH (ref 1.7–7.7)
Neutrophils Relative %: 73 %
Platelets: 295 10*3/uL (ref 150–400)
RBC: 3.73 MIL/uL — ABNORMAL LOW (ref 4.22–5.81)
RDW: 22 % — ABNORMAL HIGH (ref 11.5–15.5)
WBC: 15.7 10*3/uL — ABNORMAL HIGH (ref 4.0–10.5)
nRBC: 2 % — ABNORMAL HIGH (ref 0.0–0.2)
nRBC: 3 /100 WBC — ABNORMAL HIGH

## 2020-08-15 LAB — RENAL FUNCTION PANEL
Albumin: 2.8 g/dL — ABNORMAL LOW (ref 3.5–5.0)
Anion gap: 18 — ABNORMAL HIGH (ref 5–15)
BUN: 110 mg/dL — ABNORMAL HIGH (ref 8–23)
CO2: 22 mmol/L (ref 22–32)
Calcium: 9.7 mg/dL (ref 8.9–10.3)
Chloride: 93 mmol/L — ABNORMAL LOW (ref 98–111)
Creatinine, Ser: 1.13 mg/dL (ref 0.61–1.24)
GFR, Estimated: >60 mL/min (ref >60)
Glucose, Bld: 168 mg/dL — ABNORMAL HIGH (ref 70–99)
Phosphorus: 5.1 mg/dL — ABNORMAL HIGH (ref 2.5–4.6)
Potassium: 4.3 mmol/L (ref 3.5–5.1)
Sodium: 133 mmol/L — ABNORMAL LOW (ref 135–145)

## 2020-08-15 LAB — MAGNESIUM: Magnesium: 2.1 mg/dL (ref 1.7–2.4)

## 2020-08-15 MED ORDER — DARBEPOETIN ALFA 100 MCG/0.5ML IJ SOSY
PREFILLED_SYRINGE | INTRAMUSCULAR | Status: AC
  Filled 2020-08-15: qty 0.5

## 2020-08-15 MED ORDER — INSULIN ASPART 100 UNIT/ML IJ SOLN
2.0000 [IU] | Freq: Once | INTRAMUSCULAR | Status: AC
  Administered 2020-08-15: 2 [IU] via SUBCUTANEOUS

## 2020-08-15 MED ORDER — DARBEPOETIN ALFA 100 MCG/0.5ML IJ SOSY
100.0000 ug | PREFILLED_SYRINGE | INTRAMUSCULAR | Status: DC
  Administered 2020-08-15: 100 ug via INTRAVENOUS
  Filled 2020-08-15: qty 0.5

## 2020-08-15 MED ORDER — ALBUMIN HUMAN 25 % IV SOLN
INTRAVENOUS | Status: AC
  Filled 2020-08-15: qty 100

## 2020-08-15 MED ORDER — HEPARIN SODIUM (PORCINE) 1000 UNIT/ML IJ SOLN
INTRAMUSCULAR | Status: AC
  Administered 2020-08-15: 1000 [IU] via INTRAVENOUS_CENTRAL
  Filled 2020-08-15: qty 4

## 2020-08-15 NOTE — Progress Notes (Signed)
PROGRESS NOTE    [name]  MQK:[id number] DOB: [birth year] DOA: 06/25/2020 PCP: Townsend Roger, MD   Brief Narrative: Luis Orr is a 70 y.o. 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 Patient presented secondary to altered mental status and hypotension with recurrent pneumonia requiring vasopressor support and ICU admission.   ICU significant events: 6/29 admitted to ICU for septic shock on levo to maintain MAP>65, continued on vent support 6/29 Blood Cx>> staph species in 1 of 4 cultures drawn>> suspect contaminant 6/30 Off pressors 7/2 ID Consult for MDR acinetobacter in trach asp, Meropenem changed to unasyn 7/2 PRBC transfusion, iron replacement 7/5 Pressors added again. Central line placed. Drainage from gastrostomy tube >> CT Abd without fluid collection around gastrostomy tube but with diffuse body wall edema. Echo EF 50-55%, mild LVH 7/6 Renal Replacement Therapy ; Code status changed to partial Code (DNR) 7/7 Transfuse 1U PRBC 7/9 add solu cortef 7/10 off pressors, weaning stress steroids 7/11 Pressors added back on again 7/12 ID consult due to rising WBC, hypothermia, hypotension concern for worsening infection. Vancomycin added. Repeat blood cx collected. 7/15 CRRT stopped 7/17 Restarted on low dose levo. No evidence of new infection. Held off on antibiotics. Nephrology contacted family and decision was made to continue iHD this week to allow additional time for renal recovery. Patient is not a long term iHD candidate 7/18 plans for iHD, tolerated with 2L removed 719 slight drop in hgb to 6.9 will transfuse 1 unit PRBC 7/22 > 7/26: Tolerating HD. TOC consulted for placement. Will likely need out of state placement.    Assessment & Plan:   Active Problems:   Septic shock (HCC)   Pressure injury of skin   Acute respiratory failure with hypoxia (HCC)   AKI (acute kidney injury) ([place])    Hypotension   Goals of care, counseling/discussion   Quadriplegia (HCC)   Chronic kidney disease requiring chronic dialysis ([place])   Acute on chronic respiratory failure with hypoxia and hypercapnia Secondary to pneumonia/volume overload. Now resolved and back to baseline. Currently on ventilator support per PCCM and is baseline.  Ventilator associated pneumonia Present on admission. Sputum culture significant for acinetobacter baumannii. Patient was treated with Unasyn x14 days. Completed course.  Leukocytosis Secondary to above infection and sepsis. Significantly improved; worsened slightly but now seems to be improving.  Volume overload Secondary to renal failure with anuria. Patient has improved with CRRT and now iHD. Anasarca improved with HD.  AKI with oliguria Now appears patient is anuric. No documented urine output from last 24 hours. Nephrology is on board and was initially managing with CRRT and have transitioned to intermittent HD and tolerating. Issues with HD are that patient cannot go back to his SNF. Palliative care has been consulted and are following. [place] placed on 7/26 -Nephrology recommendations: Continuing HD  Septic shock Present on admission. Secondary to pneumonia. Patient required vasopressor support which is now weaned off. Resolved. Initial blood culture significant for likely contaminant. Repeat blood cultures negative.  Acute metabolic encephalopathy Secondary to acute illness. CT head negative for acute process. Appears improved and likely back at baseline.  Diabetes mellitus, type 2 Insulin needs have decreased. 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 Improving.  Quadriplegia Secondary to compressive myelopathy. Patient resides at [facility] but per chart review, [facility] will  not accept him back if he is HD  dependent.  Microcytic anemia Acute anemia Patient required 4 units of PRBC to date. Thought to be secondary to a combination of acute illness, chronic disease and iron deficiency. IV iron given. Stable.  Severe malnutrition -Continue tube feeds  Paroxysmal atrial fibrillation -Continue amiodarone  Pressure injury Medial/posterior sacrum present on admission. Right/posterior/lateral ankle unknown if present on admission. Left ear, not present on admission.   DVT prophylaxis: Heparin Code Status:   Code Status: Partial Code Family Communication: None at bedside Disposition Plan: Discharge pending ability to find placement. Medically stable for discharge to SNF with vent/HD   Consultants:  PCCM Nephrology Infectious disease Palliative care medicine  Procedures:    Antimicrobials: Unasyn IV Vancomycin Meropenem Cefepime   Subjective: Non-verbal  Objective: Vitals:   08/15/20 0333 08/15/20 0345 08/15/20 0400 08/15/20 0500  BP:  102/74 (!) 156/89 137/87  Pulse:   90 92  Resp:   (!) 27 (!) 30  Temp: 98.6 F (37 C)     TempSrc: Oral     SpO2:  100% 97% 97%  Weight:      Height:        Intake/Output Summary (Last 24 hours) at 08/15/2020 0547 Last data filed at 08/15/2020 0400 Gross per 24 hour  Intake 4376 ml  Output 400 ml  Net 3976 ml    Filed Weights   08/11/20 0500 08/13/20 0500 08/14/20 0500  Weight: 74.9 kg 75.4 kg 74.2 kg    Examination:  General exam: Appears calm and comfortable Respiratory system: Clear to auscultation. Respiratory effort normal. Cardiovascular system: S1 & S2 heard, RRR. No murmurs, rubs, gallops or clicks. Gastrointestinal system: Abdomen is nondistended, soft and nontender. No organomegaly or masses felt. Normal bowel sounds heard. Central nervous system: Alert. Musculoskeletal: 1+ extremity edema. Skin: No cyanosis. No rashes   Data Reviewed: I have personally reviewed following labs and imaging studies  CBC Lab  Results  Component Value Date   WBC 15.7 (H) 08/15/2020   RBC 3.73 (L) 08/15/2020   HGB 9.8 (L) 08/15/2020   HCT 31.5 (L) 08/15/2020   MCV 84.5 08/15/2020   MCH 26.3 08/15/2020   PLT 295 08/15/2020   MCHC 31.1 08/15/2020   RDW 22.0 (H) 08/15/2020   LYMPHSABS 1.6 08/15/2020   MONOABS 1.4 (H) 08/15/2020   EOSABS 0.6 (H) 08/15/2020   BASOSABS 0.3 (H) 41/28/7867     Last metabolic panel Lab Results  Component Value Date   NA 133 (L) 08/15/2020   K 4.3 08/15/2020   CL 93 (L) 08/15/2020   CO2 22 08/15/2020   BUN 110 (H) 08/15/2020   CREATININE 1.13 08/15/2020   GLUCOSE 168 (H) 08/15/2020   GFRNONAA >60 08/15/2020   GFRAA NOT CALCULATED 10/11/2019   CALCIUM 9.7 08/15/2020   PHOS 5.1 (H) 08/15/2020   PROT 6.2 (L) 08/01/2020   ALBUMIN 2.8 (L) 08/15/2020   BILITOT 1.0 08/01/2020   ALKPHOS 254 (H) 08/01/2020   AST 60 (H) 08/01/2020   ALT 69 (H) 08/01/2020   ANIONGAP 18 (H) 08/15/2020    CBG (last 3)  Recent Labs    08/14/20 1929 08/14/20 2326 08/15/20 0327  GLUCAP 192* 196* 160*      GFR: Estimated Creatinine Clearance: 54.9 mL/min (by C-G formula based on SCr of 1.13 mg/dL).  Coagulation Profile: No results for input(s): INR, PROTIME in the last 168 hours.  No results found for this or any previous visit (from the past 240 hour(s)).  Radiology Studies: No results found.      Scheduled Meds:  amiodarone  200 mg Per Tube Daily   apixaban  5 mg Per Tube BID   buPROPion  75 mg Per Tube BID   chlorhexidine gluconate (MEDLINE KIT)  15 mL Mouth Rinse BID   Chlorhexidine Gluconate Cloth  6 each Topical Q0600   darbepoetin (ARANESP) injection - DIALYSIS  100 mcg Intravenous Q Wed-HD   docusate  100 mg Per Tube BID   feeding supplement (PROSource TF)  45 mL Per Tube TID   guaiFENesin  5 mL Per Tube BID   insulin aspart  2 Units Subcutaneous Q4H   insulin glargine  10 Units Subcutaneous Daily   mouth rinse  15 mL Mouth Rinse 10 times per day    multivitamin  1 tablet Per Tube QHS   nutrition supplement (JUVEN)  1 packet Per Tube BID BM   pantoprazole sodium  40 mg Per Tube QHS   polyethylene glycol  17 g Per Tube Daily   sertraline  100 mg Per Tube Daily   sodium chloride flush  10-40 mL Intracatheter Q12H   Continuous Infusions:  sodium chloride Stopped (07/26/20 0824)   sodium chloride 250 mL (07/31/20 1428)   sodium chloride     sodium chloride     feeding supplement (NEPRO CARB STEADY) 1,000 mL (08/13/20 1748)     LOS: 35 days     Cordelia Poche, MD [facility] 08/15/2020, 5:47 AM  If 7PM-7AM, please contact night-coverage [email or web address]

## 2020-08-15 NOTE — Progress Notes (Signed)
[facility] Progress Note     Assessment/ Plan:   1) AKI -baseline Cr 0.4. AKI due to ATN /shock. CRRT initiated on 7/6 for anasarca with ineffective diuresis and uremia.  Extensive conversations in the past with palliative care as well as nephrology talking with family.  Family wishes for aggressive care with ongoing dialysis and eventual plans for possible transfer out of state -Now with TDC: Appreciate help from IR -Continue with volume removal with dialysis -MWF schedule for now -Receiving midodrine and albumin with dialysis; BP overall improved.  Does not need midodrine regularly at this time -Try and limit excessive fluid intake with nutrition   2) hypokalemia: At goal today   3) CKD-MBD, hyperphosphatemia -Now resolved.  No longer on sevelamer   4) Acute metabolic encephalopathy, likely related to sepsis and uremia:  CT head unrevealing, seems to be improving   5) Septic shock:  resolved -off pressors now -off abx as of 7/15   6) Chronic respiratory failure s/p trach -vent mgmt per ccm   7) 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  Has been given iron this admission.  Aranesp 100 MCG weekly.  Hemoglobin fluctuating around goal    Subjective:   Patient denies any complaints today   Objective:   BP 129/86 (BP Location: Left Arm)   Pulse 96   Temp 98.6 F (37 C) (Oral)   Resp (!) 30   Ht 5' 6"  (1.676 m)   Wt 74.5 kg   SpO2 98%   BMI 26.51 kg/m   Intake/Output Summary (Last 24 hours) at 08/15/2020 0941 Last data filed at 08/15/2020 0900 Gross per 24 hour  Intake 4611 ml  Output 375 ml  Net 4236 ml   Weight change: 0.3 kg  Physical Exam: Gen: chronically ill appearing, lying in bed CVS: No murmurs, normal rate Resp:trached, bl chest expansion, on vent,  LID:CVUD Ext:   2+ diffuse edema  Neuro: functional quadriplegic, eyes closed  Imaging: No results found.  Labs: BMET Recent Labs  Lab 08/09/20 0022 08/10/20 0111 08/11/20 0023 08/12/20 0235 08/13/20 0647 08/14/20 0444 08/15/20 0227  NA 133* 134* 134* 132* 133* 134* 133*  K 4.0 3.0* 3.6 4.8 3.5 3.0* 4.3  CL 95* 97* 95* 95* 93* 92* 93*  CO2 24 27 25  19* 26 25 22   GLUCOSE 152* 221* 166* 162* 148* 178* 168*  BUN 120* 72* 128* 178* 127* 40* 110*  CREATININE 1.33* 0.88 1.28* 1.62* 1.26* 0.65 1.13  CALCIUM 9.1 9.5 9.6 9.9 9.9 10.2 9.7  PHOS 5.5* 3.4 4.6 5.7* 4.3 2.2* 5.1*   CBC Recent Labs  Lab 08/12/20 0235 08/13/20 0455 08/14/20 0444 08/15/20 0227  WBC 15.2* 15.1* 17.5* 15.7*  NEUTROABS 11.7* 12.2* 14.7* 11.5*  HGB 9.6* 10.2* 11.5* 9.8*  HCT 30.2* 32.4* 35.8* 31.5*  MCV 83.9 83.9 82.5 84.5  PLT 301 187 302 295    Medications:     amiodarone  200 mg Per Tube Daily   apixaban  5 mg Per Tube BID   buPROPion  75 mg Per Tube BID   chlorhexidine gluconate (MEDLINE KIT)  15 mL Mouth Rinse BID   Chlorhexidine Gluconate Cloth  6 each Topical Q0600   darbepoetin (ARANESP) injection - DIALYSIS  100 mcg Intravenous Q Wed-HD   docusate  100 mg Per Tube BID  feeding supplement (PROSource TF)  45 mL Per Tube TID   guaiFENesin  5 mL Per Tube BID   insulin aspart  2 Units Subcutaneous Q4H   insulin glargine  10 Units Subcutaneous Daily   mouth rinse  15 mL Mouth Rinse 10 times per day   multivitamin  1 tablet Per Tube QHS   nutrition supplement (JUVEN)  1 packet Per Tube BID BM   pantoprazole sodium  40 mg Per Tube QHS   polyethylene glycol  17 g Per Tube Daily   sertraline  100 mg Per Tube Daily   sodium chloride flush  10-40 mL Intracatheter Q12H     Reesa Chew  08/15/2020, 9:41 AM

## 2020-08-16 DIAGNOSIS — J9601 Acute respiratory failure with hypoxia: Secondary | ICD-10-CM | POA: Diagnosis not present

## 2020-08-16 DIAGNOSIS — N186 End stage renal disease: Secondary | ICD-10-CM | POA: Diagnosis not present

## 2020-08-16 DIAGNOSIS — G8250 Quadriplegia, unspecified: Secondary | ICD-10-CM | POA: Diagnosis not present

## 2020-08-16 DIAGNOSIS — R739 Hyperglycemia, unspecified: Secondary | ICD-10-CM

## 2020-08-16 DIAGNOSIS — A419 Sepsis, unspecified organism: Secondary | ICD-10-CM | POA: Diagnosis not present

## 2020-08-16 LAB — POCT I-STAT 7, (LYTES, BLD GAS, ICA,H+H)
Acid-Base Excess: 1 mmol/L (ref 0.0–2.0)
Bicarbonate: 26.5 mmol/L (ref 20.0–28.0)
Calcium, Ion: 1.26 mmol/L (ref 1.15–1.40)
HCT: 39 % (ref 39.0–52.0)
Hemoglobin: 13.3 g/dL (ref 13.0–17.0)
O2 Saturation: 96 %
Potassium: 3.9 mmol/L (ref 3.5–5.1)
Sodium: 134 mmol/L — ABNORMAL LOW (ref 135–145)
TCO2: 28 mmol/L (ref 22–32)
pCO2 arterial: 46.6 mmHg (ref 32.0–48.0)
pH, Arterial: 7.363 (ref 7.350–7.450)
pO2, Arterial: 88 mmHg (ref 83.0–108.0)

## 2020-08-16 LAB — GLUCOSE, CAPILLARY
Glucose-Capillary: 237 mg/dL — ABNORMAL HIGH (ref 70–99)
Glucose-Capillary: 239 mg/dL — ABNORMAL HIGH (ref 70–99)
Glucose-Capillary: 263 mg/dL — ABNORMAL HIGH (ref 70–99)
Glucose-Capillary: 269 mg/dL — ABNORMAL HIGH (ref 70–99)
Glucose-Capillary: 278 mg/dL — ABNORMAL HIGH (ref 70–99)
Glucose-Capillary: 280 mg/dL — ABNORMAL HIGH (ref 70–99)

## 2020-08-16 MED ORDER — INSULIN GLARGINE-YFGN 100 UNIT/ML ~~LOC~~ SOLN
20.0000 [IU] | Freq: Every day | SUBCUTANEOUS | Status: DC
  Administered 2020-08-17 – 2020-08-18 (×2): 20 [IU] via SUBCUTANEOUS
  Filled 2020-08-16 (×2): qty 0.2

## 2020-08-16 MED ORDER — INSULIN ASPART 100 UNIT/ML IJ SOLN
3.0000 [IU] | INTRAMUSCULAR | Status: DC
  Administered 2020-08-16 – 2020-08-18 (×12): 3 [IU] via SUBCUTANEOUS

## 2020-08-16 MED ORDER — INSULIN GLARGINE-YFGN 100 UNIT/ML ~~LOC~~ SOLN
10.0000 [IU] | Freq: Every day | SUBCUTANEOUS | Status: AC
  Administered 2020-08-16: 10 [IU] via SUBCUTANEOUS
  Filled 2020-08-16: qty 0.1

## 2020-08-16 NOTE — Progress Notes (Signed)
[facility] Progress Note     Assessment/ Plan:   1) AKI -baseline Cr 0.4. AKI due to ATN /shock. CRRT initiated on 7/6 for anasarca with ineffective diuresis and uremia.  Extensive conversations in the past with palliative care as well as nephrology talking with family.  Family wishes for aggressive care with ongoing dialysis and eventual plans for possible transfer out of state -Now with TDC: Appreciate help from IR -Continue with volume removal with dialysis -MWF schedule for now -Receiving midodrine and albumin with dialysis; BP overall improved.  Does not need midodrine regularly at this time -Try and limit excessive fluid intake with nutrition   2) hypokalemia: recently at goal. Check tomorrow   3) CKD-MBD, hyperphosphatemia -Now resolved.  No longer on sevelamer   4) Acute metabolic encephalopathy, likely related to sepsis and uremia:  CT head unrevealing, seems to be improving   5) Septic shock:  resolved -off pressors now -off abx as of 7/15   6) Chronic respiratory failure s/p trach -vent mgmt per ccm   7) 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  Has been given iron this admission.  Aranesp 100 MCG weekly.  Hemoglobin fluctuating around goal    Subjective:   Resting with no complaints   Objective:   BP 129/79   Pulse (!) 105   Temp 97.9 F (36.6 C) (Oral)   Resp (!) 30   Ht 5' 6"  (1.676 m)   Wt 73 kg   SpO2 98%   BMI 25.98 kg/m   Intake/Output Summary (Last 24 hours) at 08/16/2020 6256 Last data filed at 08/16/2020 0600 Gross per 24 hour  Intake 1705 ml  Output 2575 ml  Net -870 ml   Weight change: 0 kg  Physical Exam: Gen: chronically ill appearing, lying in bed CVS: No murmurs, normal rate Resp:trached, bl chest expansion, on vent,  LSL:HTDS Ext:  2+ diffuse  edema  Neuro: functional quadriplegic, eyes closed  Imaging: No results found.  Labs: BMET Recent Labs  Lab 08/10/20 0111 08/11/20 0023 08/12/20 0235 08/13/20 0647 08/14/20 0444 08/15/20 0227  NA 134* 134* 132* 133* 134* 133*  K 3.0* 3.6 4.8 3.5 3.0* 4.3  CL 97* 95* 95* 93* 92* 93*  CO2 27 25 19* 26 25 22   GLUCOSE 221* 166* 162* 148* 178* 168*  BUN 72* 128* 178* 127* 40* 110*  CREATININE 0.88 1.28* 1.62* 1.26* 0.65 1.13  CALCIUM 9.5 9.6 9.9 9.9 10.2 9.7  PHOS 3.4 4.6 5.7* 4.3 2.2* 5.1*   CBC Recent Labs  Lab 08/12/20 0235 08/13/20 0455 08/14/20 0444 08/15/20 0227  WBC 15.2* 15.1* 17.5* 15.7*  NEUTROABS 11.7* 12.2* 14.7* 11.5*  HGB 9.6* 10.2* 11.5* 9.8*  HCT 30.2* 32.4* 35.8* 31.5*  MCV 83.9 83.9 82.5 84.5  PLT 301 187 302 295    Medications:     amiodarone  200 mg Per Tube Daily   apixaban  5 mg Per Tube BID   buPROPion  75 mg Per Tube BID   chlorhexidine gluconate (MEDLINE KIT)  15 mL Mouth Rinse BID   Chlorhexidine Gluconate Cloth  6 each Topical Q0600   darbepoetin (ARANESP) injection - DIALYSIS  100 mcg Intravenous Q Wed-HD   docusate  100 mg Per Tube BID   feeding supplement (PROSource TF)  45 mL Per Tube TID   guaiFENesin  5 mL Per Tube BID   insulin aspart  2 Units Subcutaneous Q4H   insulin glargine  10 Units Subcutaneous Daily   insulin glargine-yfgn  10 Units Subcutaneous Daily   Followed by   Derrill Memo ON 08/17/2020] insulin glargine-yfgn  20 Units Subcutaneous Daily   mouth rinse  15 mL Mouth Rinse 10 times per day   multivitamin  1 tablet Per Tube QHS   nutrition supplement (JUVEN)  1 packet Per Tube BID BM   pantoprazole sodium  40 mg Per Tube QHS   polyethylene glycol  17 g Per Tube Daily   sertraline  100 mg Per Tube Daily   sodium chloride flush  10-40 mL Intracatheter Q12H     Shaune Pollack Khloe Hunkele  08/16/2020, 9:22 AM

## 2020-08-16 NOTE — Progress Notes (Signed)
PROGRESS NOTE    [name]  OIN:[id number] DOB: 06/09/1950 DOA: 06/17/2020 PCP: Townsend Roger, MD   Brief Narrative: Luis Orr is a 70 y.o. 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 Patient presented secondary to altered mental status and hypotension with recurrent pneumonia requiring vasopressor support and ICU admission.   ICU significant events: 6/29 admitted to ICU for septic shock on levo to maintain MAP>65, continued on vent support 6/29 Blood Cx>> staph species in 1 of 4 cultures drawn>> suspect contaminant 6/30 Off pressors 7/2 ID Consult for MDR acinetobacter in trach asp, Meropenem changed to unasyn 7/2 PRBC transfusion, iron replacement 7/5 Pressors added again. Central line placed. Drainage from gastrostomy tube >> CT Abd without fluid collection around gastrostomy tube but with diffuse body wall edema. Echo EF 50-55%, mild LVH 7/6 Renal Replacement Therapy ; Code status changed to partial Code (DNR) 7/7 Transfuse 1U PRBC 7/9 add solu cortef 7/10 off pressors, weaning stress steroids 7/11 Pressors added back on again 7/12 ID consult due to rising WBC, hypothermia, hypotension concern for worsening infection. Vancomycin added. Repeat blood cx collected. 7/15 CRRT stopped 7/17 Restarted on low dose levo. No evidence of new infection. Held off on antibiotics. Nephrology contacted family and decision was made to continue iHD this week to allow additional time for renal recovery. Patient is not a long term iHD candidate 7/18 plans for iHD, tolerated with 2L removed 719 slight drop in hgb to 6.9 will transfuse 1 unit PRBC 7/22 > 7/26: Tolerating HD. TOC consulted for placement. Will likely need out of state placement.    Assessment & Plan:   Active Problems:   Septic shock (HCC)   Pressure injury of skin   Acute respiratory failure with hypoxia (HCC)   AKI (acute kidney injury) ([place])    Hypotension   Goals of care, counseling/discussion   Quadriplegia (HCC)   Chronic kidney disease requiring chronic dialysis ([place])   Acute on chronic respiratory failure with hypoxia and hypercapnia Secondary to pneumonia/volume overload. Now resolved and back to baseline. Currently on ventilator support per PCCM and is baseline.  Ventilator associated pneumonia Present on admission. Sputum culture significant for acinetobacter baumannii. Patient was treated with Unasyn x14 days. Completed course.  Leukocytosis Secondary to above infection and sepsis. Significantly improved; worsened slightly but now seems to be improving.  Volume overload Secondary to renal failure with anuria. Patient has improved with CRRT and now iHD. Anasarca improved with HD.  AKI with oliguria Now appears patient is anuric. No documented urine output from last 24 hours. Nephrology is on board and was initially managing with CRRT and have transitioned to intermittent HD and tolerating. Issues with HD are that patient cannot go back to his SNF. Palliative care has been consulted and are following. [place] placed on 7/26 -Nephrology recommendations: Continuing HD  Septic shock Present on admission. Secondary to pneumonia. Patient required vasopressor support which is now weaned off. Resolved. Initial blood culture significant for likely contaminant. Repeat blood cultures negative.  Acute metabolic encephalopathy Secondary to acute illness. CT head negative for acute process. Appears improved and likely back at baseline.  Diabetes mellitus, type 2 Insulin needs have decreased. 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 Improving.  Quadriplegia Secondary to compressive myelopathy. Patient resides at [facility] but per chart review, [facility]  will not accept him back if he is HD  dependent.  Microcytic anemia Acute anemia Patient required 4 units of PRBC to date. Thought to be secondary to a combination of acute illness, chronic disease and iron deficiency. IV iron given. Stable.  Severe malnutrition -Continue tube feeds  Paroxysmal atrial fibrillation -Continue amiodarone  Pressure injury Medial/posterior sacrum present on admission. Right/posterior/lateral ankle unknown if present on admission. Left ear, not present on admission.   DVT prophylaxis: Heparin Code Status:   Code Status: Partial Code Family Communication: None at bedside Disposition Plan: Discharge pending ability to find placement. Medically stable for discharge to SNF with vent/HD.   Consultants:  PCCM Nephrology Infectious disease Palliative care medicine  Procedures:    Antimicrobials: Unasyn IV Vancomycin Meropenem Cefepime   Subjective: Non-verbal  Objective: Vitals:   08/16/20 1100 08/16/20 1126 08/16/20 1200 08/16/20 1300  BP: 129/78  119/74 125/78  Pulse: (!) 106  (!) 107 (!) 107  Resp: (!) 30  (!) 30 (!) 30  Temp:  99.5 F (37.5 C)    TempSrc:  Oral    SpO2: 97%  97% 96%  Weight:      Height:        Intake/Output Summary (Last 24 hours) at 08/16/2020 1337 Last data filed at 08/16/2020 0600 Gross per 24 hour  Intake 1340 ml  Output 2575 ml  Net -1235 ml    Filed Weights   08/15/20 1350 08/15/20 1735 08/16/20 0400  Weight: 74.5 kg 72 kg 73 kg    Examination:  General exam: Appears calm and comfortable Respiratory system: Rhonchi. Respiratory effort normal. Cardiovascular system: S1 & S2 heard, RRR. No murmurs, rubs, gallops or clicks. Gastrointestinal system: Abdomen is nondistended, soft and nontender. No organomegaly or masses felt. Normal bowel sounds heard. Central nervous system: Alert and oriented. No focal neurological deficits. Musculoskeletal: Slight upper extremity edema. No calf tenderness Skin: No cyanosis. No rashes Psychiatry:  Judgement and insight appear normal. Mood & affect appropriate.    Data Reviewed: I have personally reviewed following labs and imaging studies  CBC Lab Results  Component Value Date   WBC 15.7 (H) 08/15/2020   RBC 3.73 (L) 08/15/2020   HGB 13.3 08/16/2020   HCT 39.0 08/16/2020   MCV 84.5 08/15/2020   MCH 26.3 08/15/2020   PLT 295 08/15/2020   MCHC 31.1 08/15/2020   RDW 22.0 (H) 08/15/2020   LYMPHSABS 1.6 08/15/2020   MONOABS 1.4 (H) 08/15/2020   EOSABS 0.6 (H) 08/15/2020   BASOSABS 0.3 (H) 93/81/0175     Last metabolic panel Lab Results  Component Value Date   NA 134 (L) 08/16/2020   K 3.9 08/16/2020   CL 93 (L) 08/15/2020   CO2 22 08/15/2020   BUN 110 (H) 08/15/2020   CREATININE 1.13 08/15/2020   GLUCOSE 168 (H) 08/15/2020   GFRNONAA >60 08/15/2020   GFRAA NOT CALCULATED 10/11/2019   CALCIUM 9.7 08/15/2020   PHOS 5.1 (H) 08/15/2020   PROT 6.2 (L) 08/01/2020   ALBUMIN 2.8 (L) 08/15/2020   BILITOT 1.0 08/01/2020   ALKPHOS 254 (H) 08/01/2020   AST 60 (H) 08/01/2020   ALT 69 (H) 08/01/2020   ANIONGAP 18 (H) 08/15/2020    CBG (last 3)  Recent Labs    08/16/20 0324 08/16/20 0737 08/16/20 1122  GLUCAP 269* 237* 280*      GFR: Estimated Creatinine Clearance: 54.9 mL/min (by C-G formula based on SCr of 1.13 mg/dL).  Coagulation Profile: No results for input(s): INR,  PROTIME in the last 168 hours.  No results found for this or any previous visit (from the past 240 hour(s)).       Radiology Studies: No results found.      Scheduled Meds:  amiodarone  200 mg Per Tube Daily   apixaban  5 mg Per Tube BID   buPROPion  75 mg Per Tube BID   chlorhexidine gluconate (MEDLINE KIT)  15 mL Mouth Rinse BID   Chlorhexidine Gluconate Cloth  6 each Topical Q0600   darbepoetin (ARANESP) injection - DIALYSIS  100 mcg Intravenous Q Wed-HD   docusate  100 mg Per Tube BID   feeding supplement (PROSource TF)  45 mL Per Tube TID   guaiFENesin  5 mL Per Tube BID    insulin aspart  2 Units Subcutaneous Q4H   insulin glargine  10 Units Subcutaneous Daily   [START ON 08/17/2020] insulin glargine-yfgn  20 Units Subcutaneous Daily   mouth rinse  15 mL Mouth Rinse 10 times per day   multivitamin  1 tablet Per Tube QHS   nutrition supplement (JUVEN)  1 packet Per Tube BID BM   pantoprazole sodium  40 mg Per Tube QHS   polyethylene glycol  17 g Per Tube Daily   sertraline  100 mg Per Tube Daily   sodium chloride flush  10-40 mL Intracatheter Q12H   Continuous Infusions:  sodium chloride Stopped (07/26/20 0824)   sodium chloride 250 mL (07/31/20 1428)   sodium chloride     sodium chloride     feeding supplement (NEPRO CARB STEADY) 1,000 mL (08/15/20 1818)     LOS: 36 days     Cordelia Poche, MD [facility] 08/16/2020, 1:37 PM  If 7PM-7AM, please contact night-coverage [email or web address]

## 2020-08-16 NOTE — TOC Progression Note (Addendum)
Transition of Care [facility]) - Progression Note    Patient Details  Name: Luis Orr MRN: [id number] Date of Birth: 1950/06/17  Transition of Care [facility]) CM/SW Contact  Milinda Antis, [place] Phone Number: 08/16/2020, 10:42 AM  Clinical Narrative:     10:29- CSW called [facility] in [place] [phone]) to inquire about whether they will be able to accept the patient.  There was no answer.  CSW left a VM requesting a returned call.    Expected Discharge Plan: [place] Barriers to Discharge: Continued Medical Work up  Expected Discharge Plan and Services Expected Discharge Plan: [place] arrangements for the past 2 months: Post-Acute Facility                                       Social Determinants of Health (SDOH) Interventions    Readmission Risk Interventions No flowsheet data found.

## 2020-08-16 NOTE — Progress Notes (Addendum)
NAME:  Luis Orr, MRN:  [id number], DOB:  12-Apr-1950, LOS: 58 ADMISSION DATE:  06/15/2020, CONSULTATION DATE:  06/27/2020 REFERRING MD:  Dr Francia Greaves, EDP CHIEF COMPLAINT:  septic shock    History of Present Illness:  70 yo male from [facility] with hx of C3-C5 compressive myelopathy with functional quadriplegia and chronic trach/vent presented to ED with altered mental status and hypotension with recurrent HCAP.  Has multiple recent admissions for sepsis secondary to various infections including UTIs, pneumonias and bacteremia in setting of MDR Klebsiella, MRSA and pseudomonas. Most recent admission September 2021 in setting of sepsis due to MDR Klebsiella with right ureteral calculus s/p double J ureteral stent placement and treated with meropenem x 10days.  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 Central line placed. Drainage from gastrostomy tube >> CT Abd without fluid collection around gastrostomy tube but with diffuse body wall edema. Echo EF 50-55%, mild LVH  7/6 Renal Replacement Therapy ; Code status changed to partial Code (DNR) 7/7 Transfuse 1U PRBC 7/9 add solu cortef 7/10 off pressors, weaning stress steroids  7/11 Pressors added back on again 7/12 ID consult due to rising WBC, hypothermia, hypotension concern for worsening infection. Vancomycin added. Repeat blood cx collected. 7/15 CRRT stopped   7/17 Restarted on low dose levo. No evidence of new infection. Held off on antibiotics. Nephrology contacted family and decision was made to continue iHD this week to allow additional time for renal recovery. Patient is not a long term iHD candidate  7/18 plans for iHD, tolerated with 2L removed  7/19 slight drop in hgb to 6.9 will transfuse 1 unit PRBC  7/20 To TRH, PCCM following for trach / vent needs 7/22>> tolerating HD, will need out of state placement  Interim History / Subjective:  No significant events Remains on vent PRVC 380 X 30 + 5 30% 6 XLT in place WBC trending down and afebrile  Objective   Blood pressure (!) 152/88, pulse (!) 103, temperature 98.5 F (36.9 C), temperature source Oral, resp. rate (!) 30, height '5\' 6"'$  (1.676 m), weight 73 kg, SpO2 96 %.    Vent Mode: PRVC FiO2 (%):  [30 %] 30 % Set Rate:  [30 bmp] 30 bmp Vt Set:  [380 mL] 380 mL PEEP:  [5 cmH20] 5 cmH20 Plateau Pressure:  [24 cmH20-27 cmH20] 25 cmH20   Intake/Output Summary (Last 24 hours) at 08/16/2020 0710 Last data filed at 08/16/2020 0600 Gross per 24 hour  Intake 1895 ml  Output 2575 ml  Net -680 ml    Filed Weights   08/15/20 1350 08/15/20 1735 08/16/20 0400  Weight: 74.5 kg 72 kg 73 kg    Physical Exam:  General:   NAD on mech vent HEENT: MM pink/moist; 6 XLT trach in place CV: s1s2, RRR, no m/r/g PULM:  rhonchi BS bilaterally; on mech vent PRVC  GI: soft, bsx4 active  Extremities: warm/dry, no edema; SCDs in place  Skin: no rashes or lesions     Patient Lines/Drains/Airways Status     Active Line/Drains/Airways     Name Placement date Placement time Site Days   Peripheral IV 07/12/20 22 G 1.75" Anterior;Left;Proximal Forearm 07/12/20  1252  Forearm  32   Hemodialysis Catheter Right Internal jugular Double lumen Permanent (Tunneled) 08/07/20  1032  Internal jugular  6   Gastrostomy/Enterostomy 07/17/19  0330  --  393   Gastrostomy/Enterostomy Gastrostomy Other (comment) --   --  Other (comment)  --   Colostomy LLQ --  --  LLQ  --   Tracheostomy [place] XLT Proximal 6 mm Cuffed 07/06/2020  --  6 mm  33   Pressure Injury 06/19/2020 Sacrum Posterior;Medial Stage 4 - Full thickness tissue loss with exposed bone, tendon or muscle. 06/15/2020  2240  -- 33   Pressure Injury 07/05/2020 Sacrum Medial Deep Tissue Pressure Injury - Purple or maroon localized area of discolored intact skin or blood-filled blister due to damage of underlying soft tissue from pressure and/or shear. black 07/01/2020  2240  -- 33   Pressure Injury 07/22/20 Ankle Posterior;Right;Lateral Stage 1 -  Intact skin with non-blanchable redness of a localized area usually over a bony prominence. red nonblanchable area 07/22/20  1035  -- 22   Pressure Injury 07/23/20 Ear Left Deep Tissue Pressure Injury - Purple or maroon localized area of discolored intact skin or blood-filled blister due to damage of underlying soft tissue from pressure and/or shear. DTI left ear 07/23/20  2000  -- 21   Wound / Incision (Open or Dehisced) 07/16/20 Skin tear Back Right pink 07/16/20  0800  Back  28   Wound / Incision (Open or Dehisced) 08/10/20 Skin tear Penis Anterior 08/10/20  2000  Penis  3             Resolved Hospital Problem list   Hyperkalemia Tachycardia Hypotension Thrombocytosis  AKI Septic shock 2nd to HCAP and UTI all present on admission -Acinetobacter in sputum culture from 07/12/20. Completed 14 days of Unasyn for -Of pressors on 4/14. Restarted  levo 7/17.  Assessment & Plan:   Acute on chronic hypoxic and hypercarbic respiratory failure secondary pna and volume overload Tracheostomy and ventilator dependent status. P: -continue current vent settings PRVC 8cc/kg  -6 proximial XLT trach in place -continue routine trach care -VAP prevention in place  -TOC following for placement  ESRD P: -continue iHD per nephrology  Hyperglycemia P: -increased lantus to 20 -continue SSI and CBG monitoring  Goals  of Care DNR in event of cardiac arrest -per [facility] -Palliative care following   PCCM available prn  JD Rexene Agent [facility] 08/16/2020, 7:17 AM  Please see [email or web address] for pager details.  From 7A-7P if no response, please call [phone]. After hours, please call ELink [phone].

## 2020-08-17 DIAGNOSIS — J9601 Acute respiratory failure with hypoxia: Secondary | ICD-10-CM | POA: Diagnosis not present

## 2020-08-17 LAB — GLUCOSE, CAPILLARY
Glucose-Capillary: 254 mg/dL — ABNORMAL HIGH (ref 70–99)
Glucose-Capillary: 224 mg/dL — ABNORMAL HIGH (ref 70–99)
Glucose-Capillary: 204 mg/dL — ABNORMAL HIGH (ref 70–99)
Glucose-Capillary: 190 mg/dL — ABNORMAL HIGH (ref 70–99)

## 2020-08-17 NOTE — Progress Notes (Signed)
PROGRESS NOTE    Luis Orr  YSA:[id number] DOB: [birth year] DOA: 06/14/2020 PCP: Townsend Roger, MD   Brief Narrative: Luis Orr is a 70 y.o. 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 Patient presented secondary to altered mental status and hypotension with recurrent pneumonia requiring vasopressor support and ICU admission.   ICU significant events: 6/29 admitted to ICU for septic shock on levo to maintain MAP>65, continued on vent support 6/29 Blood Cx>> staph species in 1 of 4 cultures drawn>> suspect contaminant 6/30 Off pressors 7/2 ID Consult for MDR acinetobacter in trach asp, Meropenem changed to unasyn 7/2 PRBC transfusion, iron replacement 7/5 Pressors added again. Central line placed. Drainage from gastrostomy tube >> CT Abd without fluid collection around gastrostomy tube but with diffuse body wall edema. Echo EF 50-55%, mild LVH 7/6 Renal Replacement Therapy ; Code status changed to partial Code (DNR) 7/7 Transfuse 1U PRBC 7/9 add solu cortef 7/10 off pressors, weaning stress steroids 7/11 Pressors added back on again 7/12 ID consult due to rising WBC, hypothermia, hypotension concern for worsening infection. Vancomycin added. Repeat blood cx collected. 7/15 CRRT stopped 7/17 Restarted on low dose levo. No evidence of new infection. Held off on antibiotics. Nephrology contacted family and decision was made to continue iHD this week to allow additional time for renal recovery. Patient is not a long term iHD candidate 7/18 plans for iHD, tolerated with 2L removed 719 slight drop in hgb to 6.9 will transfuse 1 unit PRBC 7/22 > 7/26: Tolerating HD. TOC consulted for placement. Will likely need out of state placement.    Assessment & Plan:   Active Problems:   Septic shock (HCC)   Pressure injury of skin   Acute respiratory failure with hypoxia (HCC)   AKI (acute kidney injury) ([place])    Hypotension   Goals of care, counseling/discussion   Quadriplegia (HCC)   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)   Hyperglycemia   Acute on chronic respiratory failure with hypoxia and hypercapnia Secondary to pneumonia/volume overload. Now resolved and back to baseline. Currently on ventilator support per PCCM and is baseline.  Ventilator associated pneumonia Present on admission. Sputum culture significant for acinetobacter baumannii. Patient was treated with Unasyn x14 days. Completed course.  Leukocytosis Secondary to above infection and sepsis. Significantly improved; worsened slightly but now seems to be improving.  Volume overload Secondary to renal failure with anuria. Patient has improved with CRRT and now iHD. Anasarca improved with HD.  AKI with oliguria Now appears patient is anuric. No documented urine output from last 24 hours. Nephrology is on board and was initially managing with CRRT and have transitioned to intermittent HD and tolerating. Issues with HD are that patient cannot go back to his SNF. Palliative care has been consulted and are following. [place] placed on 7/26 -Nephrology recommendations: Continuing HD  Septic shock Present on admission. Secondary to pneumonia. Patient required vasopressor support which is now weaned off. Resolved. Initial blood culture significant for likely contaminant. Repeat blood cultures negative.  Acute metabolic encephalopathy Secondary to acute illness. CT head negative for acute process. Appears improved and likely back at baseline.  Diabetes mellitus, type 2 Insulin needs have decreased. 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 Improving.  Quadriplegia Secondary to compressive myelopathy. Patient resides at [facility] but per  chart review, [facility] will not accept him back if he is  HD dependent.  Microcytic anemia Acute anemia Patient required 4 units of PRBC to date. Thought to be secondary to a combination of acute illness, chronic disease and iron deficiency. IV iron given. Stable.  Severe malnutrition -Continue tube feeds  Paroxysmal atrial fibrillation -Continue amiodarone  Pressure injury Medial/posterior sacrum present on admission. Right/posterior/lateral ankle unknown if present on admission. Left ear, not present on admission.   DVT prophylaxis: Heparin Code Status:   Code Status: Partial Code Family Communication: None at bedside Disposition Plan: Discharge pending ability to find placement. Medically stable for discharge to SNF with vent/HD. I called [facility] on 8/4 and left a message for admissions to get in touch with our [facility] team for coordination of transfer.   Consultants:  PCCM Nephrology Infectious disease Palliative care medicine  Procedures:    Antimicrobials: Unasyn IV Vancomycin Meropenem Cefepime   Subjective: Non-verbal  Objective: Vitals:   08/17/20 1000 08/17/20 1100 08/17/20 1123 08/17/20 1200  BP: (!) 80/58 107/71  139/82  Pulse: 92 93  96  Resp: (!) 30 (!) 30  (!) 24  Temp:   98.3 F (36.8 C)   TempSrc:   Oral   SpO2: 97% 98%  98%  Weight:      Height:        Intake/Output Summary (Last 24 hours) at 08/17/2020 1346 Last data filed at 08/17/2020 0700 Gross per 24 hour  Intake 2827 ml  Output 710 ml  Net 2117 ml    Filed Weights   08/15/20 1735 08/16/20 0400 08/17/20 0500  Weight: 72 kg 73 kg 73.3 kg    Examination:  General: Chronically ill appearing, no distress. Comfortable.   Data Reviewed: I have personally reviewed following labs and imaging studies  CBC Lab Results  Component Value Date   WBC 15.7 (H) 08/15/2020   RBC 3.73 (L) 08/15/2020   HGB 13.3 08/16/2020   HCT 39.0 08/16/2020   MCV 84.5 08/15/2020   MCH 26.3 08/15/2020   PLT 295 08/15/2020   MCHC 31.1 08/15/2020    RDW 22.0 (H) 08/15/2020   LYMPHSABS 1.6 08/15/2020   MONOABS 1.4 (H) 08/15/2020   EOSABS 0.6 (H) 08/15/2020   BASOSABS 0.3 (H) 31/51/7616     Last metabolic panel Lab Results  Component Value Date   NA 134 (L) 08/16/2020   K 3.9 08/16/2020   CL 93 (L) 08/15/2020   CO2 22 08/15/2020   BUN 110 (H) 08/15/2020   CREATININE 1.13 08/15/2020   GLUCOSE 168 (H) 08/15/2020   GFRNONAA >60 08/15/2020   GFRAA NOT CALCULATED 10/11/2019   CALCIUM 9.7 08/15/2020   PHOS 5.1 (H) 08/15/2020   PROT 6.2 (L) 08/01/2020   ALBUMIN 2.8 (L) 08/15/2020   BILITOT 1.0 08/01/2020   ALKPHOS 254 (H) 08/01/2020   AST 60 (H) 08/01/2020   ALT 69 (H) 08/01/2020   ANIONGAP 18 (H) 08/15/2020    CBG (last 3)  Recent Labs    08/17/20 0319 08/17/20 0826 08/17/20 1122  GLUCAP 254* 224* 204*      GFR: Estimated Creatinine Clearance: 54.9 mL/min (by C-G formula based on SCr of 1.13 mg/dL).  Coagulation Profile: No results for input(s): INR, PROTIME in the last 168 hours.  No results found for this or any previous visit (from the past 240 hour(s)).       Radiology Studies: No results found.      Scheduled Meds:  amiodarone  200 mg  Per Tube Daily   apixaban  5 mg Per Tube BID   buPROPion  75 mg Per Tube BID   chlorhexidine gluconate (MEDLINE KIT)  15 mL Mouth Rinse BID   Chlorhexidine Gluconate Cloth  6 each Topical Q0600   darbepoetin (ARANESP) injection - DIALYSIS  100 mcg Intravenous Q Wed-HD   docusate  100 mg Per Tube BID   feeding supplement (PROSource TF)  45 mL Per Tube TID   guaiFENesin  5 mL Per Tube BID   insulin aspart  3 Units Subcutaneous Q4H   insulin glargine  10 Units Subcutaneous Daily   insulin glargine-yfgn  20 Units Subcutaneous Daily   mouth rinse  15 mL Mouth Rinse 10 times per day   multivitamin  1 tablet Per Tube QHS   nutrition supplement (JUVEN)  1 packet Per Tube BID BM   pantoprazole sodium  40 mg Per Tube QHS   polyethylene glycol  17 g Per Tube Daily    sertraline  100 mg Per Tube Daily   sodium chloride flush  10-40 mL Intracatheter Q12H   Continuous Infusions:  sodium chloride Stopped (07/26/20 0824)   sodium chloride 250 mL (07/31/20 1428)   sodium chloride     sodium chloride     feeding supplement (NEPRO CARB STEADY) 1,000 mL (08/15/20 1818)     LOS: 37 days     Cordelia Poche, MD [facility] 08/17/2020, 1:46 PM  If 7PM-7AM, please contact night-coverage [email or web address]

## 2020-08-17 NOTE — TOC Progression Note (Addendum)
Transition of Care [facility]) - Progression Note    Patient Details  Name: Luis Orr MRN: [id number] Date of Birth: May 19, 1950  Transition of Care [facility]) CM/SW Contact  Milinda Antis, [place] Phone Number: 08/17/2020, 11:00 AM  Clinical Narrative:    10:58-  CSW called [place] in [place] [phone]) to inquire about whether they will be able to accept the patient.  CSW spoke with the receptionist who informed CSW that Dr. Lonny Prude called the facility yesterday and gave the same receptionist CSW's number to call back.  The receptionist reported that she forwarded the message and that CSW should be receiving a returned call within the next 24 hours.    14:14-  CSW received a call from the patient's spouse and updated on the patient's placement status.     Expected Discharge Plan: [place] Barriers to Discharge: Continued Medical Work up  Expected Discharge Plan and Services Expected Discharge Plan: [place] arrangements for the past 2 months: Post-Acute Facility                                       Social Determinants of Health (SDOH) Interventions    Readmission Risk Interventions No flowsheet data found.

## 2020-08-17 NOTE — Progress Notes (Signed)
Patient ID: Luis Orr, male   DOB: 05-12-1950, 70 y.o.   MRN: [id number] [facility] Progress Note   Assessment/ Plan:   1. Acute kidney Injury: With baseline creatinine of around 0.4 and suffered acute injury from ATN of septic shock.  Started CRRT on 7/6 for volume overload/anasarca and uremia.  After improving hemodynamic state, transitioned to intermittent hemodialysis on a MWF schedule with family wishing for continued aggressive care.  He remains anuric and is on schedule for hemodialysis today. 2.  Septic shock/acute metabolic encephalopathy: Off pressors and antibiotics at this time and intermittently requiring midodrine/albumin with dialysis. 3.  Chronic respiratory failure: Status post tracheostomy with ventilator management per CCM 4.  Quadriplegia secondary to compressive myelopathy: Previously resident at [facility] but unfortunately cannot be readmitted there now that he is on (likely) long-term dialysis 5.  Chronic kidney disease/metabolic bone disease: Currently off binders and monitor calcium/phosphorus trend with dialysis.  Subjective:   Appears comfortable resting in bed, opens eyes to calling out his name but noninteractive   Objective:   BP (!) 84/62   Pulse 93   Temp 98.3 F (36.8 C) (Oral)   Resp (!) 30   Ht 5' 6"  (1.676 m)   Wt 73.3 kg   SpO2 96%   BMI 26.08 kg/m   Intake/Output Summary (Last 24 hours) at 08/17/2020 0715 Last data filed at 08/17/2020 0700 Gross per 24 hour  Intake 2827 ml  Output 710 ml  Net 2117 ml   Weight change: -1.2 kg  Physical Exam: Gen: Comfortably resting in bed, not interactive CVS: Pulse regular rhythm, normal rate, S1 and S2 normal Resp: Clear to auscultation bilaterally, on ventilator via tracheostomy.  Right IJ TDC Abd: Soft, PEG tube in situ with ongoing tube feeds, left lower quadrant ostomy with intact bag Ext: 1+ diffuse edema, functionally quadriplegic  Imaging: No results  found.  Labs: BMET Recent Labs  Lab 08/11/20 0023 08/12/20 0235 08/13/20 0647 08/14/20 0444 08/15/20 0227 08/16/20 1051  NA 134* 132* 133* 134* 133* 134*  K 3.6 4.8 3.5 3.0* 4.3 3.9  CL 95* 95* 93* 92* 93*  --   CO2 25 19* 26 25 22   --   GLUCOSE 166* 162* 148* 178* 168*  --   BUN 128* 178* 127* 40* 110*  --   CREATININE 1.28* 1.62* 1.26* 0.65 1.13  --   CALCIUM 9.6 9.9 9.9 10.2 9.7  --   PHOS 4.6 5.7* 4.3 2.2* 5.1*  --    CBC Recent Labs  Lab 08/12/20 0235 08/13/20 0455 08/14/20 0444 08/15/20 0227 08/16/20 1051  WBC 15.2* 15.1* 17.5* 15.7*  --   NEUTROABS 11.7* 12.2* 14.7* 11.5*  --   HGB 9.6* 10.2* 11.5* 9.8* 13.3  HCT 30.2* 32.4* 35.8* 31.5* 39.0  MCV 83.9 83.9 82.5 84.5  --   PLT 301 187 302 295  --     Medications:     amiodarone  200 mg Per Tube Daily   apixaban  5 mg Per Tube BID   buPROPion  75 mg Per Tube BID   chlorhexidine gluconate (MEDLINE KIT)  15 mL Mouth Rinse BID   Chlorhexidine Gluconate Cloth  6 each Topical Q0600   darbepoetin (ARANESP) injection - DIALYSIS  100 mcg Intravenous Q Wed-HD   docusate  100 mg Per Tube BID   feeding supplement (PROSource TF)  45 mL Per Tube TID   guaiFENesin  5 mL Per Tube BID   insulin aspart  3 Units Subcutaneous Q4H   insulin glargine  10 Units Subcutaneous Daily   insulin glargine-yfgn  20 Units Subcutaneous Daily   mouth rinse  15 mL Mouth Rinse 10 times per day   multivitamin  1 tablet Per Tube QHS   nutrition supplement (JUVEN)  1 packet Per Tube BID BM   pantoprazole sodium  40 mg Per Tube QHS   polyethylene glycol  17 g Per Tube Daily   sertraline  100 mg Per Tube Daily   sodium chloride flush  10-40 mL Intracatheter Q12H   Elmarie Shiley, MD 08/17/2020, 7:15 AM

## 2020-08-18 DIAGNOSIS — A419 Sepsis, unspecified organism: Secondary | ICD-10-CM | POA: Diagnosis not present

## 2020-08-18 DIAGNOSIS — G8250 Quadriplegia, unspecified: Secondary | ICD-10-CM | POA: Diagnosis not present

## 2020-08-18 DIAGNOSIS — J9601 Acute respiratory failure with hypoxia: Secondary | ICD-10-CM | POA: Diagnosis not present

## 2020-08-18 DIAGNOSIS — N186 End stage renal disease: Secondary | ICD-10-CM | POA: Diagnosis not present

## 2020-08-18 LAB — GLUCOSE, CAPILLARY
Glucose-Capillary: 150 mg/dL — ABNORMAL HIGH (ref 70–99)
Glucose-Capillary: 156 mg/dL — ABNORMAL HIGH (ref 70–99)
Glucose-Capillary: 178 mg/dL — ABNORMAL HIGH (ref 70–99)
Glucose-Capillary: 195 mg/dL — ABNORMAL HIGH (ref 70–99)
Glucose-Capillary: 217 mg/dL — ABNORMAL HIGH (ref 70–99)
Glucose-Capillary: 233 mg/dL — ABNORMAL HIGH (ref 70–99)
Glucose-Capillary: 241 mg/dL — ABNORMAL HIGH (ref 70–99)

## 2020-08-18 LAB — RENAL FUNCTION PANEL
Albumin: 3.2 g/dL — ABNORMAL LOW (ref 3.5–5.0)
Anion gap: 13 (ref 5–15)
BUN: 57 mg/dL — ABNORMAL HIGH (ref 8–23)
CO2: 26 mmol/L (ref 22–32)
Calcium: 9.5 mg/dL (ref 8.9–10.3)
Chloride: 94 mmol/L — ABNORMAL LOW (ref 98–111)
Creatinine, Ser: 0.88 mg/dL (ref 0.61–1.24)
GFR, Estimated: >60 mL/min (ref >60)
Glucose, Bld: 201 mg/dL — ABNORMAL HIGH (ref 70–99)
Phosphorus: 2.8 mg/dL (ref 2.5–4.6)
Potassium: 3.4 mmol/L — ABNORMAL LOW (ref 3.5–5.1)
Sodium: 133 mmol/L — ABNORMAL LOW (ref 135–145)

## 2020-08-18 MED ORDER — HEPARIN SODIUM (PORCINE) 1000 UNIT/ML IJ SOLN
INTRAMUSCULAR | Status: AC
  Filled 2020-08-18: qty 4

## 2020-08-18 MED ORDER — ALBUMIN HUMAN 25 % IV SOLN
INTRAVENOUS | Status: AC
  Administered 2020-08-18: 25 g
  Filled 2020-08-18: qty 100

## 2020-08-18 MED ORDER — INSULIN ASPART 100 UNIT/ML IJ SOLN
0.0000 [IU] | INTRAMUSCULAR | Status: DC
  Administered 2020-08-18 (×2): 3 [IU] via SUBCUTANEOUS
  Administered 2020-08-18: 5 [IU] via SUBCUTANEOUS
  Administered 2020-08-19 (×4): 3 [IU] via SUBCUTANEOUS
  Administered 2020-08-19: 5 [IU] via SUBCUTANEOUS
  Administered 2020-08-19 – 2020-08-20 (×3): 3 [IU] via SUBCUTANEOUS
  Administered 2020-08-20: 8 [IU] via SUBCUTANEOUS
  Administered 2020-08-20 – 2020-08-21 (×8): 3 [IU] via SUBCUTANEOUS
  Administered 2020-08-22: 2 [IU] via SUBCUTANEOUS
  Administered 2020-08-22: 3 [IU] via SUBCUTANEOUS
  Administered 2020-08-22: 2 [IU] via SUBCUTANEOUS
  Administered 2020-08-22: 3 [IU] via SUBCUTANEOUS
  Administered 2020-08-22 (×2): 2 [IU] via SUBCUTANEOUS
  Administered 2020-08-23: 3 [IU] via SUBCUTANEOUS
  Administered 2020-08-23: 2 [IU] via SUBCUTANEOUS
  Administered 2020-08-23: 5 [IU] via SUBCUTANEOUS
  Administered 2020-08-23: 2 [IU] via SUBCUTANEOUS
  Administered 2020-08-23: 3 [IU] via SUBCUTANEOUS
  Administered 2020-08-23 – 2020-08-25 (×5): 2 [IU] via SUBCUTANEOUS
  Administered 2020-08-25 (×2): 3 [IU] via SUBCUTANEOUS
  Administered 2020-08-26 (×2): 2 [IU] via SUBCUTANEOUS
  Administered 2020-08-26: 3 [IU] via SUBCUTANEOUS

## 2020-08-18 MED ORDER — INSULIN GLARGINE-YFGN 100 UNIT/ML ~~LOC~~ SOLN
30.0000 [IU] | Freq: Every day | SUBCUTANEOUS | Status: DC
  Administered 2020-08-19 – 2020-08-21 (×3): 30 [IU] via SUBCUTANEOUS
  Filled 2020-08-18 (×3): qty 0.3

## 2020-08-18 MED ORDER — MIDODRINE HCL 5 MG PO TABS
ORAL_TABLET | ORAL | Status: AC
  Administered 2020-08-18: 10 mg
  Filled 2020-08-18: qty 2

## 2020-08-18 NOTE — Progress Notes (Signed)
PROGRESS NOTE    EPIC TRIBBETT  KZS:[id number] DOB: [birth year] DOA: 06/17/2020 PCP: Townsend Roger, MD   Brief Narrative: Luis Orr is a 70 y.o. 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 Patient presented secondary to altered mental status and hypotension with recurrent pneumonia requiring vasopressor support and ICU admission.   ICU significant events: 6/29 admitted to ICU for septic shock on levo to maintain MAP>65, continued on vent support 6/29 Blood Cx>> staph species in 1 of 4 cultures drawn>> suspect contaminant 6/30 Off pressors 7/2 ID Consult for MDR acinetobacter in trach asp, Meropenem changed to unasyn 7/2 PRBC transfusion, iron replacement 7/5 Pressors added again. Central line placed. Drainage from gastrostomy tube >> CT Abd without fluid collection around gastrostomy tube but with diffuse body wall edema. Echo EF 50-55%, mild LVH 7/6 Renal Replacement Therapy ; Code status changed to partial Code (DNR) 7/7 Transfuse 1U PRBC 7/9 add solu cortef 7/10 off pressors, weaning stress steroids 7/11 Pressors added back on again 7/12 ID consult due to rising WBC, hypothermia, hypotension concern for worsening infection. Vancomycin added. Repeat blood cx collected. 7/15 CRRT stopped 7/17 Restarted on low dose levo. No evidence of new infection. Held off on antibiotics. Nephrology contacted family and decision was made to continue iHD this week to allow additional time for renal recovery. Patient is not a long term iHD candidate 7/18 plans for iHD, tolerated with 2L removed 719 slight drop in hgb to 6.9 will transfuse 1 unit PRBC 7/22 > 7/26: Tolerating HD. TOC consulted for placement. Will likely need out of state placement.    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 Now resolved and back to baseline. Currently on ventilator support per PCCM and is baseline.  Ventilator associated pneumonia Present on admission. Sputum culture significant for acinetobacter baumannii. Patient was treated with Unasyn x14 days. Completed course.  Leukocytosis Secondary to above infection and sepsis. Significantly improved; worsened slightly but now seems to be improving.  Volume overload Secondary to renal failure with anuria. Patient has improved with CRRT and now iHD. Anasarca improved with HD.  AKI with oliguria Now appears patient is anuric. No documented urine output from last 24 hours. Nephrology is on board and was initially managing with CRRT and have transitioned to intermittent HD and tolerating. Issues with HD are that patient cannot go back to his SNF. Palliative care has been consulted and are following. Santa Claus placed on 7/26 -Nephrology recommendations: Continuing HD  Septic shock Present on admission. Secondary to pneumonia. Patient required vasopressor support which is now weaned off. Resolved. Initial blood culture significant for likely contaminant. Repeat blood cultures negative.  Acute metabolic encephalopathy Secondary to acute illness. CT head negative for acute process. Appears improved and likely back at baseline.  Diabetes mellitus, type 2 Insulin needs have decreased. 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 Improving.  Quadriplegia  Secondary to compressive myelopathy. Patient resides at  [facility] but per chart review, [facility] will not accept him back if he is HD dependent.  Microcytic anemia Acute anemia Patient required 4 units of PRBC to date. Thought to be secondary to a combination of acute illness, chronic disease and iron deficiency. IV iron given. Stable.  Severe malnutrition -Continue tube feeds  Paroxysmal atrial fibrillation -Continue amiodarone  Pressure injury Medial/posterior sacrum present on admission. Right/posterior/lateral ankle unknown if present on admission. Left ear, not present on admission.   DVT prophylaxis: Heparin Code Status:   Code Status: Partial Code Family Communication: None at bedside Disposition Plan: Discharge pending ability to find placement. Medically stable for discharge to SNF with vent/HD. I called [facility] on 8/4 and left a message for admissions to get in touch with our [facility] team for coordination of transfer.   Consultants:  PCCM Nephrology Infectious disease Palliative care medicine  Procedures:    Antimicrobials: Unasyn IV Vancomycin Meropenem Cefepime   Subjective: Non-verbal  Objective: Vitals:   08/18/20 0700 08/18/20 0748 08/18/20 0850 08/18/20 1123  BP: (!) 81/65     Pulse: 90  95   Resp: (!) 30  (!) 30   Temp:  97.6 F (36.4 C)  98.6 F (37 C)  TempSrc:  Oral  Oral  SpO2: 97%  96%   Weight:      Height:        Intake/Output Summary (Last 24 hours) at 08/18/2020 1528 Last data filed at 08/18/2020 0700 Gross per 24 hour  Intake 1610 ml  Output 650 ml  Net 960 ml    Filed Weights   08/15/20 1735 08/16/20 0400 08/17/20 0500  Weight: 72 kg 73 kg 73.3 kg    Examination:  General exam: Appears calm and comfortable Respiratory system: Clear to auscultation. Respiratory effort normal. Cardiovascular system: S1 & S2 heard, RRR. No murmurs, rubs, gallops or clicks. Gastrointestinal system: Abdomen is slightly distended with lower quadrant edema, soft and nontender. No organomegaly or  masses felt. Normal bowel sounds heard. Central nervous system: Alert Musculoskeletal: 1+ pitting edema. No calf tenderness Skin: No cyanosis. No rashes   Data Reviewed: I have personally reviewed following labs and imaging studies  CBC Lab Results  Component Value Date   WBC 15.7 (H) 08/15/2020   RBC 3.73 (L) 08/15/2020   HGB 13.3 08/16/2020   HCT 39.0 08/16/2020   MCV 84.5 08/15/2020   MCH 26.3 08/15/2020   PLT 295 08/15/2020   MCHC 31.1 08/15/2020   RDW 22.0 (H) 08/15/2020   LYMPHSABS 1.6 08/15/2020   MONOABS 1.4 (H) 08/15/2020   EOSABS 0.6 (H) 08/15/2020   BASOSABS 0.3 (H) 41/74/0814     Last metabolic panel Lab Results  Component Value Date   NA 133 (L) 08/18/2020   K 3.4 (L) 08/18/2020   CL 94 (L) 08/18/2020   CO2 26 08/18/2020   BUN 57 (H) 08/18/2020   CREATININE 0.88 08/18/2020   GLUCOSE 201 (H) 08/18/2020   GFRNONAA >60 08/18/2020   GFRAA NOT CALCULATED 10/11/2019   CALCIUM 9.5 08/18/2020   PHOS 2.8 08/18/2020   PROT 6.2 (L) 08/01/2020   ALBUMIN 3.2 (L) 08/18/2020   BILITOT 1.0 08/01/2020   ALKPHOS 254 (H) 08/01/2020   AST 60 (H) 08/01/2020   ALT 69 (H) 08/01/2020   ANIONGAP 13 08/18/2020    CBG (last 3)  Recent Labs    08/18/20 0409 08/18/20 0741 08/18/20 1115  GLUCAP 150* 178* 233*  GFR: Estimated Creatinine Clearance: 70.5 mL/min (by C-G formula based on SCr of 0.88 mg/dL).  Coagulation Profile: No results for input(s): INR, PROTIME in the last 168 hours.  No results found for this or any previous visit (from the past 240 hour(s)).       Radiology Studies: No results found.      Scheduled Meds:  amiodarone  200 mg Per Tube Daily   apixaban  5 mg Per Tube BID   buPROPion  75 mg Per Tube BID   chlorhexidine gluconate (MEDLINE KIT)  15 mL Mouth Rinse BID   Chlorhexidine Gluconate Cloth  6 each Topical Q0600   darbepoetin (ARANESP) injection - DIALYSIS  100 mcg Intravenous Q Wed-HD   docusate  100 mg Per Tube BID    feeding supplement (PROSource TF)  45 mL Per Tube TID   guaiFENesin  5 mL Per Tube BID   insulin aspart  3 Units Subcutaneous Q4H   insulin glargine-yfgn  20 Units Subcutaneous Daily   mouth rinse  15 mL Mouth Rinse 10 times per day   multivitamin  1 tablet Per Tube QHS   nutrition supplement (JUVEN)  1 packet Per Tube BID BM   pantoprazole sodium  40 mg Per Tube QHS   polyethylene glycol  17 g Per Tube Daily   sertraline  100 mg Per Tube Daily   sodium chloride flush  10-40 mL Intracatheter Q12H   Continuous Infusions:  sodium chloride Stopped (07/26/20 0824)   sodium chloride 250 mL (07/31/20 1428)   sodium chloride     sodium chloride     feeding supplement (NEPRO CARB STEADY) 1,000 mL (08/15/20 1818)     LOS: 38 days     Cordelia Poche, MD [facility] 08/18/2020, 3:28 PM  If 7PM-7AM, please contact night-coverage [email or web address]

## 2020-08-18 NOTE — Progress Notes (Signed)
Patient ID: Luis Orr, male   DOB: [birth year], 70 y.o.   MRN: [id number] [facility] Progress Note   Assessment/ Plan:   1. Acute kidney Injury: With baseline creatinine of around 0.4 and suffered acute injury from ATN of septic shock.  Started CRRT on 7/6 for volume overload/anasarca and uremia.  After improving hemodynamic state, transitioned to intermittent hemodialysis on a MWF schedule with family wishing for continued aggressive care.  He remains anuric and I will order for bladder scan today. 2.  Septic shock/acute metabolic encephalopathy: Off pressors and antibiotics at this time and intermittently requiring midodrine/albumin with dialysis. 3.  Chronic respiratory failure: Status post tracheostomy with ventilator management per CCM 4.  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: Previously resident at [facility] but unfortunately cannot be readmitted there now that he is on (likely) long-term dialysis 5.  Chronic kidney disease/metabolic bone disease: Calcium and phosphorus level under decent control with hemodialysis, off binders  Subjective:   Without acute events overnight, limited interaction.   Objective:   BP 108/65 (BP Location: Right Arm)   Pulse 98   Temp 98.4 F (36.9 C) (Oral)   Resp (!) 30   Ht 5' 6"  (1.676 m)   Wt 73.3 kg   SpO2 96%   BMI 26.08 kg/m   Intake/Output Summary (Last 24 hours) at 08/18/2020 0710 Last data filed at 08/18/2020 0515 Gross per 24 hour  Intake 1115 ml  Output 650 ml  Net 465 ml   Weight change:   Physical Exam: Gen: Opens eyes to calling out his name and tracks me around the room CVS: Pulse regular rhythm, normal rate, S1 and S2 normal Resp: Clear to auscultation bilaterally, on ventilator via tracheostomy.  Right IJ TDC Abd: Soft, PEG tube in situ with ongoing tube feeds, left lower quadrant ostomy with intact bag.  Firmness around suprapubic area Ext: Trace-1+ diffuse edema, functionally  quadriplegic  Imaging: No results found.  Labs: BMET Recent Labs  Lab 08/12/20 0235 08/13/20 0647 08/14/20 0444 08/15/20 0227 08/16/20 1051  NA 132* 133* 134* 133* 134*  K 4.8 3.5 3.0* 4.3 3.9  CL 95* 93* 92* 93*  --   CO2 19* 26 25 22   --   GLUCOSE 162* 148* 178* 168*  --   BUN 178* 127* 40* 110*  --   CREATININE 1.62* 1.26* 0.65 1.13  --   CALCIUM 9.9 9.9 10.2 9.7  --   PHOS 5.7* 4.3 2.2* 5.1*  --    CBC Recent Labs  Lab 08/12/20 0235 08/13/20 0455 08/14/20 0444 08/15/20 0227 08/16/20 1051  WBC 15.2* 15.1* 17.5* 15.7*  --   NEUTROABS 11.7* 12.2* 14.7* 11.5*  --   HGB 9.6* 10.2* 11.5* 9.8* 13.3  HCT 30.2* 32.4* 35.8* 31.5* 39.0  MCV 83.9 83.9 82.5 84.5  --   PLT 301 187 302 295  --     Medications:     amiodarone  200 mg Per Tube Daily   apixaban  5 mg Per Tube BID   buPROPion  75 mg Per Tube BID   chlorhexidine gluconate (MEDLINE KIT)  15 mL Mouth Rinse BID   Chlorhexidine Gluconate Cloth  6 each Topical Q0600   darbepoetin (ARANESP) injection - DIALYSIS  100 mcg Intravenous Q Wed-HD   docusate  100 mg Per Tube BID   feeding supplement (PROSource TF)  45 mL Per Tube TID   guaiFENesin  5 mL Per Tube BID   heparin sodium (porcine)  insulin aspart  3 Units Subcutaneous Q4H   insulin glargine-yfgn  20 Units Subcutaneous Daily   mouth rinse  15 mL Mouth Rinse 10 times per day   multivitamin  1 tablet Per Tube QHS   nutrition supplement (JUVEN)  1 packet Per Tube BID BM   pantoprazole sodium  40 mg Per Tube QHS   polyethylene glycol  17 g Per Tube Daily   sertraline  100 mg Per Tube Daily   sodium chloride flush  10-40 mL Intracatheter Q12H   Elmarie Shiley, MD 08/18/2020, 7:10 AM

## 2020-08-18 NOTE — Progress Notes (Signed)
[place] assisted with tele-video visit

## 2020-08-19 DIAGNOSIS — J9601 Acute respiratory failure with hypoxia: Secondary | ICD-10-CM | POA: Diagnosis not present

## 2020-08-19 LAB — CBC
HCT: 38.6 % — ABNORMAL LOW (ref 39.0–52.0)
Hemoglobin: 11.9 g/dL — ABNORMAL LOW (ref 13.0–17.0)
MCH: 26.2 pg (ref 26.0–34.0)
MCHC: 30.8 g/dL (ref 30.0–36.0)
MCV: 84.8 fL (ref 80.0–100.0)
Platelets: 311 10*3/uL (ref 150–400)
RBC: 4.55 MIL/uL (ref 4.22–5.81)
RDW: 21.4 % — ABNORMAL HIGH (ref 11.5–15.5)
WBC: 14.6 10*3/uL — ABNORMAL HIGH (ref 4.0–10.5)
nRBC: 2.7 % — ABNORMAL HIGH (ref 0.0–0.2)

## 2020-08-19 LAB — GLUCOSE, CAPILLARY
Glucose-Capillary: 199 mg/dL — ABNORMAL HIGH (ref 70–99)
Glucose-Capillary: 167 mg/dL — ABNORMAL HIGH (ref 70–99)
Glucose-Capillary: 200 mg/dL — ABNORMAL HIGH (ref 70–99)
Glucose-Capillary: 183 mg/dL — ABNORMAL HIGH (ref 70–99)
Glucose-Capillary: 228 mg/dL — ABNORMAL HIGH (ref 70–99)
Glucose-Capillary: 196 mg/dL — ABNORMAL HIGH (ref 70–99)

## 2020-08-19 NOTE — Progress Notes (Signed)
Patient ID: Luis Orr, male   DOB: 12-22-1950, 70 y.o.   MRN: [id number] [facility] Progress Note   Assessment/ Plan:   1. Acute kidney Injury-prolonged dialysis dependency: With baseline creatinine of around 0.4 and suffered dense ATN from septic shock-without any evidence of renal recovery to date.  Started CRRT on 7/6 for volume overload/anasarca and uremia.  After improving hemodynamic state, transitioned to intermittent hemodialysis on a MWF schedule with family wishing for continued aggressive care.  He will likely need long-term dialysis and is on schedule for hemodialysis again tomorrow.  Bladder scan yesterday with 5 cc urine. 2.  Septic shock/acute metabolic encephalopathy: Off pressors and antibiotics at this time and intermittently requiring midodrine/albumin with dialysis. 3.  Chronic respiratory failure: Status post tracheostomy with ventilator management per CCM 4.  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: Previously resident at [facility] but unfortunately cannot be readmitted there now that he is on (likely) long-term dialysis 5.  Chronic kidney disease/metabolic bone disease: Calcium and phosphorus level under decent control with hemodialysis, off binders  Subjective:   No acute events noted overnight, patient unable to voice any complaints.   Objective:   BP (!) 88/58   Pulse 96   Temp 100.2 F (37.9 C) (Axillary)   Resp (!) 30   Ht 5' 6"  (1.676 m)   Wt 75.2 kg   SpO2 96%   BMI 26.76 kg/m   Intake/Output Summary (Last 24 hours) at 08/19/2020 0719 Last data filed at 08/19/2020 2956 Gross per 24 hour  Intake 1230 ml  Output 350 ml  Net 880 ml   Weight change:   Physical Exam: Gen: Sleeping comfortably-opens eyes to calling out his name and tracks me around the room. CVS: Pulse regular rhythm, normal rate, S1 and S2 normal Resp: Clear to auscultation bilaterally, on ventilator via tracheostomy.  Right IJ TDC Abd: Soft, PEG  tube in situ with ongoing tube feeds, left lower quadrant ostomy with intact bag.  Firmness around suprapubic area Ext: Trace-1+ diffuse edema, functionally quadriplegic  Imaging: No results found.  Labs: BMET Recent Labs  Lab 08/13/20 0647 08/14/20 0444 08/15/20 0227 08/16/20 1051 08/18/20 0755  NA 133* 134* 133* 134* 133*  K 3.5 3.0* 4.3 3.9 3.4*  CL 93* 92* 93*  --  94*  CO2 26 25 22   --  26  GLUCOSE 148* 178* 168*  --  201*  BUN 127* 40* 110*  --  57*  CREATININE 1.26* 0.65 1.13  --  0.88  CALCIUM 9.9 10.2 9.7  --  9.5  PHOS 4.3 2.2* 5.1*  --  2.8   CBC Recent Labs  Lab 08/13/20 0455 08/14/20 0444 08/15/20 0227 08/16/20 1051 08/19/20 0143  WBC 15.1* 17.5* 15.7*  --  14.6*  NEUTROABS 12.2* 14.7* 11.5*  --   --   HGB 10.2* 11.5* 9.8* 13.3 11.9*  HCT 32.4* 35.8* 31.5* 39.0 38.6*  MCV 83.9 82.5 84.5  --  84.8  PLT 187 302 295  --  311    Medications:     amiodarone  200 mg Per Tube Daily   apixaban  5 mg Per Tube BID   buPROPion  75 mg Per Tube BID   chlorhexidine gluconate (MEDLINE KIT)  15 mL Mouth Rinse BID   Chlorhexidine Gluconate Cloth  6 each Topical Q0600   darbepoetin (ARANESP) injection - DIALYSIS  100 mcg Intravenous Q Wed-HD   docusate  100 mg Per Tube BID   feeding supplement (  PROSource TF)  45 mL Per Tube TID   guaiFENesin  5 mL Per Tube BID   insulin aspart  0-15 Units Subcutaneous Q4H   insulin glargine-yfgn  30 Units Subcutaneous Daily   mouth rinse  15 mL Mouth Rinse 10 times per day   multivitamin  1 tablet Per Tube QHS   nutrition supplement (JUVEN)  1 packet Per Tube BID BM   pantoprazole sodium  40 mg Per Tube QHS   polyethylene glycol  17 g Per Tube Daily   sertraline  100 mg Per Tube Daily   sodium chloride flush  10-40 mL Intracatheter Q12H   Elmarie Shiley, MD 08/19/2020, 7:19 AM

## 2020-08-19 NOTE — Progress Notes (Signed)
PROGRESS NOTE    EPIC TRIBBETT  KZS:[id number] DOB: [birth year] DOA: 06/22/2020 PCP: Townsend Roger, MD   Brief Narrative: Luis Orr is a 70 y.o. 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 Patient presented secondary to altered mental status and hypotension with recurrent pneumonia requiring vasopressor support and ICU admission.   ICU significant events: 6/29 admitted to ICU for septic shock on levo to maintain MAP>65, continued on vent support 6/29 Blood Cx>> staph species in 1 of 4 cultures drawn>> suspect contaminant 6/30 Off pressors 7/2 ID Consult for MDR acinetobacter in trach asp, Meropenem changed to unasyn 7/2 PRBC transfusion, iron replacement 7/5 Pressors added again. Central line placed. Drainage from gastrostomy tube >> CT Abd without fluid collection around gastrostomy tube but with diffuse body wall edema. Echo EF 50-55%, mild LVH 7/6 Renal Replacement Therapy ; Code status changed to partial Code (DNR) 7/7 Transfuse 1U PRBC 7/9 add solu cortef 7/10 off pressors, weaning stress steroids 7/11 Pressors added back on again 7/12 ID consult due to rising WBC, hypothermia, hypotension concern for worsening infection. Vancomycin added. Repeat blood cx collected. 7/15 CRRT stopped 7/17 Restarted on low dose levo. No evidence of new infection. Held off on antibiotics. Nephrology contacted family and decision was made to continue iHD this week to allow additional time for renal recovery. Patient is not a long term iHD candidate 7/18 plans for iHD, tolerated with 2L removed 719 slight drop in hgb to 6.9 will transfuse 1 unit PRBC 7/22 > 7/26: Tolerating HD. TOC consulted for placement. Will likely need out of state placement.    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 Now resolved and back to baseline. Currently on ventilator support per PCCM and is baseline.  Ventilator associated pneumonia Present on admission. Sputum culture significant for acinetobacter baumannii. Patient was treated with Unasyn x14 days. Completed course.  Leukocytosis Secondary to above infection and sepsis. Significantly improved; worsened slightly but now seems to be improving.  Volume overload Secondary to renal failure with anuria. Patient has improved with CRRT and now iHD. Anasarca improved with HD.  AKI with oliguria Now appears patient is anuric. No documented urine output from last 24 hours. Nephrology is on board and was initially managing with CRRT and have transitioned to intermittent HD and tolerating. Issues with HD are that patient cannot go back to his SNF. Palliative care has been consulted and are following. Santa Claus placed on 7/26 -Nephrology recommendations: Continuing HD  Septic shock Present on admission. Secondary to pneumonia. Patient required vasopressor support which is now weaned off. Resolved. Initial blood culture significant for likely contaminant. Repeat blood cultures negative.  Acute metabolic encephalopathy Secondary to acute illness. CT head negative for acute process. Appears improved and likely back at baseline.  Diabetes mellitus, type 2 Insulin needs have decreased. 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 Improving.  Quadriplegia  Secondary to compressive myelopathy. Patient resides at  [facility] but per chart review, [facility] will not accept him back if he is HD dependent.  Microcytic anemia Acute anemia Patient required 4 units of PRBC to date. Thought to be secondary to a combination of acute illness, chronic disease and iron deficiency. IV iron given. Stable.  Severe malnutrition -Continue tube feeds  Paroxysmal atrial fibrillation -Continue amiodarone  Pressure injury Medial/posterior sacrum present on admission. Right/posterior/lateral ankle unknown if present on admission. Left ear, not present on admission.   DVT prophylaxis: Heparin Code Status:   Code Status: Partial Code Family Communication: None at bedside Disposition Plan: Discharge pending ability to find placement. Medically stable for discharge to SNF with vent/HD. I called [facility] on 8/4 and left a message for admissions to get in touch with our [facility] team for coordination of transfer.   Consultants:  PCCM Nephrology Infectious disease Palliative care medicine  Procedures:    Antimicrobials: Unasyn IV Vancomycin Meropenem Cefepime   Subjective: Non-verbal  Objective: Vitals:   08/19/20 0600 08/19/20 0743 08/19/20 0809 08/19/20 1527  BP: (!) 88/58     Pulse: 96  95   Resp: (!) 30  (!) 30   Temp:  99.5 F (37.5 C)  98.8 F (37.1 C)  TempSrc:  Axillary  Oral  SpO2: 96%  99%   Weight:      Height:        Intake/Output Summary (Last 24 hours) at 08/19/2020 1653 Last data filed at 08/19/2020 5462 Gross per 24 hour  Intake 630 ml  Output 350 ml  Net 280 ml    Filed Weights   08/16/20 0400 08/17/20 0500 08/19/20 0108  Weight: 73 kg 73.3 kg 75.2 kg    Examination:  General exam: Appears calm and comfortable Respiratory system: Respiratory effort normal.  Data Reviewed: I have personally reviewed following labs and imaging studies  CBC Lab Results  Component Value Date   WBC 14.6 (H) 08/19/2020   RBC 4.55 08/19/2020   HGB 11.9 (L) 08/19/2020   HCT 38.6 (L)  08/19/2020   MCV 84.8 08/19/2020   MCH 26.2 08/19/2020   PLT 311 08/19/2020   MCHC 30.8 08/19/2020   RDW 21.4 (H) 08/19/2020   LYMPHSABS 1.6 08/15/2020   MONOABS 1.4 (H) 08/15/2020   EOSABS 0.6 (H) 08/15/2020   BASOSABS 0.3 (H) 70/35/0093     Last metabolic panel Lab Results  Component Value Date   NA 133 (L) 08/18/2020   K 3.4 (L) 08/18/2020   CL 94 (L) 08/18/2020   CO2 26 08/18/2020   BUN 57 (H) 08/18/2020   CREATININE 0.88 08/18/2020   GLUCOSE 201 (H) 08/18/2020   GFRNONAA >60 08/18/2020   GFRAA NOT CALCULATED 10/11/2019   CALCIUM 9.5 08/18/2020   PHOS 2.8 08/18/2020   PROT 6.2 (L) 08/01/2020   ALBUMIN 3.2 (L) 08/18/2020   BILITOT 1.0 08/01/2020   ALKPHOS 254 (H) 08/01/2020   AST 60 (H) 08/01/2020   ALT 69 (H) 08/01/2020   ANIONGAP 13 08/18/2020    CBG (last 3)  Recent Labs    08/19/20 0736 08/19/20 1118 08/19/20 1521  GLUCAP 167* 200* 183*      GFR: Estimated Creatinine Clearance: 70.5 mL/min (by C-G formula based on SCr of 0.88 mg/dL).  Coagulation Profile: No results for input(s): INR, PROTIME in the last 168 hours.  No results found for this or any previous visit (from the past 240 hour(s)).       Radiology Studies:  No results found.      Scheduled Meds:  amiodarone  200 mg Per Tube Daily   apixaban  5 mg Per Tube BID   buPROPion  75 mg Per Tube BID   chlorhexidine gluconate (MEDLINE KIT)  15 mL Mouth Rinse BID   Chlorhexidine Gluconate Cloth  6 each Topical Q0600   darbepoetin (ARANESP) injection - DIALYSIS  100 mcg Intravenous Q Wed-HD   docusate  100 mg Per Tube BID   feeding supplement (PROSource TF)  45 mL Per Tube TID   guaiFENesin  5 mL Per Tube BID   insulin aspart  0-15 Units Subcutaneous Q4H   insulin glargine-yfgn  30 Units Subcutaneous Daily   mouth rinse  15 mL Mouth Rinse 10 times per day   multivitamin  1 tablet Per Tube QHS   nutrition supplement (JUVEN)  1 packet Per Tube BID BM   pantoprazole sodium  40 mg Per  Tube QHS   polyethylene glycol  17 g Per Tube Daily   sertraline  100 mg Per Tube Daily   sodium chloride flush  10-40 mL Intracatheter Q12H   Continuous Infusions:  sodium chloride Stopped (07/26/20 0824)   sodium chloride 250 mL (07/31/20 1428)   sodium chloride     sodium chloride     feeding supplement (NEPRO CARB STEADY) 1,000 mL (08/19/20 0102)     LOS: 39 days     Cordelia Poche, MD [facility] 08/19/2020, 4:53 PM  If 7PM-7AM, please contact night-coverage [email or web address]

## 2020-08-20 DIAGNOSIS — Z930 Tracheostomy status: Secondary | ICD-10-CM | POA: Diagnosis not present

## 2020-08-20 DIAGNOSIS — G8250 Quadriplegia, unspecified: Secondary | ICD-10-CM | POA: Diagnosis not present

## 2020-08-20 DIAGNOSIS — A419 Sepsis, unspecified organism: Secondary | ICD-10-CM | POA: Diagnosis not present

## 2020-08-20 DIAGNOSIS — Z9911 Dependence on respirator [ventilator] status: Secondary | ICD-10-CM | POA: Diagnosis not present

## 2020-08-20 DIAGNOSIS — J9621 Acute and chronic respiratory failure with hypoxia: Secondary | ICD-10-CM | POA: Diagnosis not present

## 2020-08-20 DIAGNOSIS — J9601 Acute respiratory failure with hypoxia: Secondary | ICD-10-CM | POA: Diagnosis not present

## 2020-08-20 DIAGNOSIS — N186 End stage renal disease: Secondary | ICD-10-CM | POA: Diagnosis not present

## 2020-08-20 LAB — RENAL FUNCTION PANEL
Albumin: 2.8 g/dL — ABNORMAL LOW (ref 3.5–5.0)
Anion gap: 20 — ABNORMAL HIGH (ref 5–15)
BUN: 187 mg/dL — ABNORMAL HIGH (ref 8–23)
CO2: 19 mmol/L — ABNORMAL LOW (ref 22–32)
Calcium: 9.6 mg/dL (ref 8.9–10.3)
Chloride: 94 mmol/L — ABNORMAL LOW (ref 98–111)
Creatinine, Ser: 1.77 mg/dL — ABNORMAL HIGH (ref 0.61–1.24)
GFR, Estimated: 41 mL/min — ABNORMAL LOW (ref >60)
Glucose, Bld: 186 mg/dL — ABNORMAL HIGH (ref 70–99)
Phosphorus: 6.1 mg/dL — ABNORMAL HIGH (ref 2.5–4.6)
Potassium: 4.9 mmol/L (ref 3.5–5.1)
Sodium: 133 mmol/L — ABNORMAL LOW (ref 135–145)

## 2020-08-20 LAB — GLUCOSE, CAPILLARY
Glucose-Capillary: 151 mg/dL — ABNORMAL HIGH (ref 70–99)
Glucose-Capillary: 161 mg/dL — ABNORMAL HIGH (ref 70–99)
Glucose-Capillary: 172 mg/dL — ABNORMAL HIGH (ref 70–99)
Glucose-Capillary: 172 mg/dL — ABNORMAL HIGH (ref 70–99)
Glucose-Capillary: 195 mg/dL — ABNORMAL HIGH (ref 70–99)
Glucose-Capillary: 225 mg/dL — ABNORMAL HIGH (ref 70–99)
Glucose-Capillary: 255 mg/dL — ABNORMAL HIGH (ref 70–99)

## 2020-08-20 LAB — POCT I-STAT 7, (LYTES, BLD GAS, ICA,H+H)
Acid-base deficit: 1 mmol/L (ref 0.0–2.0)
Bicarbonate: 25.3 mmol/L (ref 20.0–28.0)
Calcium, Ion: 1.12 mmol/L — ABNORMAL LOW (ref 1.15–1.40)
HCT: 38 % — ABNORMAL LOW (ref 39.0–52.0)
Hemoglobin: 12.9 g/dL — ABNORMAL LOW (ref 13.0–17.0)
O2 Saturation: 97 %
Patient temperature: 79.9
Potassium: 3 mmol/L — ABNORMAL LOW (ref 3.5–5.1)
Sodium: 137 mmol/L (ref 135–145)
TCO2: 27 mmol/L (ref 22–32)
pCO2 arterial: 28.9 mmHg — ABNORMAL LOW (ref 32.0–48.0)
pH, Arterial: 7.503 — ABNORMAL HIGH (ref 7.350–7.450)
pO2, Arterial: 49 mmHg — ABNORMAL LOW (ref 83.0–108.0)

## 2020-08-20 MED ORDER — ALTEPLASE 2 MG IJ SOLR
INTRAMUSCULAR | Status: AC
  Filled 2020-08-20: qty 2

## 2020-08-20 MED ORDER — HEPARIN SODIUM (PORCINE) 1000 UNIT/ML DIALYSIS: 40.0000 [IU]/kg | INTRAMUSCULAR | Status: DC | PRN

## 2020-08-20 MED ORDER — ALTEPLASE 2 MG IJ SOLR
INTRAMUSCULAR | Status: AC
  Administered 2020-08-20: 2 mg
  Filled 2020-08-20: qty 2

## 2020-08-20 MED ORDER — IPRATROPIUM-ALBUTEROL 0.5-2.5 (3) MG/3ML IN SOLN: 3.0000 mL | Freq: Four times a day (QID) | RESPIRATORY_TRACT | Status: DC | PRN

## 2020-08-20 MED ORDER — HEPARIN SODIUM (PORCINE) 1000 UNIT/ML IJ SOLN
INTRAMUSCULAR | Status: AC
  Filled 2020-08-20: qty 4

## 2020-08-20 NOTE — TOC Progression Note (Addendum)
Transition of Care [facility]) - Progression Note    Patient Details  Name: Luis Orr MRN: [id number] Date of Birth: Jun 05, 1950  Transition of Care [facility]) CM/SW Contact  Milinda Antis, [place] Phone Number: 08/20/2020, 11:57 AM  Clinical Narrative:    10:22-  CSW called [facility] in [place] and was informed that the facility is unable to accept the patient because the insurance in not in network.    10:51-  CSW called [facility] in [place] and spoke with Maya in admissions.  CSW was informed that the facility does not take patients who are on both ventilators and HD. CSW asked about other facilities in [place] who could accept a patient with both and was informed that Chad did not know of any in [place].  10:54-  CSW called [facility] in [place].  And spoke with Mancel Bale in admissions.  [phone].  The facility does both vent SNF and HD.  CSW was asked to send the referral to [phone].  11:15-  CSW met with the patient in the room.  The patient was able to follow CSW with his eyes and when asked yes or no questions, the patient would node to answer "yes", shake his head from side to side to answer "no", and shrug his shoulders to respond that he was unsure.  CSW informed the patient that CSW was searching for out of state placement.  When asked if he was aware of possible out of state placement the patient moved his head side to side (a no response).  CSW asked if the patient wanted to go out of state the patient responded by shrugging his shoulders (and unsure response).  CSW asked if the patient wanted to continue to stay at the hospital and the patient moved his head from side to side (a no response).  When asked if he believed that his health would improve the patient shrugged his shoulders (an unsure response) and tried to move his mouth as if to speak, but no words came out.  The patient had a drained and sad affect during the entire encounter.  15:38- CSW called the  patient's wife to provide an update on the status of placement.  There was no answer.  CSW left a VM requesting a returned call.  16:02- CSW faxed referral to [facility] rehabilitation.    Expected Discharge Plan: [place] Barriers to Discharge: Continued Medical Work up  Expected Discharge Plan and Services Expected Discharge Plan: [place] arrangements for the past 2 months: Post-Acute Facility                                       Social Determinants of Health (SDOH) Interventions    Readmission Risk Interventions No flowsheet data found.

## 2020-08-20 NOTE — Progress Notes (Addendum)
Nutrition Follow-up  DOCUMENTATION CODES:   Not applicable  INTERVENTION:   TF via PEG: Nepro at 45 ml/h (1080 ml per day). Prosource TF 45 ml TID discontinued by Nephrology  Provides 1944 kcal, 87 gm protein, 785 ml free water daily.  Recommend resume Prosource TF 45 ml TID to ensure adequate protein intake to support wound healing.   Continue Renal MVI daily.  Continue Juven BID via tube, each packet provides 80 calories, 8 grams of carbohydrate, 2.5  grams of protein (collagen), 7 grams of L-arginine and 7 grams of L-glutamine; supplement contains CaHMB, Vitamins C, E, B12 and Zinc to promote wound healing  NUTRITION DIAGNOSIS:   Increased nutrient needs related to wound healing as evidenced by estimated needs.  Ongoing   GOAL:   Patient will meet greater than or equal to 90% of their needs  Met with TF  MONITOR:   Vent status, Labs, Weight trends, TF tolerance, Skin, I & O's  REASON FOR ASSESSMENT:   Ventilator, Consult Enteral/tube feeding initiation and management  ASSESSMENT:   70 year old male who presented to the ED on 6/29 from [facility] with AMS and hypotension. 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 Pt admitted with septic shock, acute on chronic hypoxemic respiratory failure.  Discussed patient in ICU rounds and with RN today.  Receiving HD at bedside this morning.  Awaiting placement in a facility that accepts HD and vent patients.  Receiving Nepro at 45 ml/h via PEG to provide 1944 kcal, 87 gm protein, 785 ml free water daily. Prosource TF was discontinued today by Nephrology d/t elevated BUN. Current protein provision is 1.2 gm/kg, which is suboptimal to support wound healing.   Patient remains intubated on ventilator support MV: 11 L/min Temp (24hrs), Avg:98.9 F (37.2 C),  Min:98.8 F (37.1 C), Max:99.1 F (37.3 C)   Admission weight 90.2 kg Current weight 73.8 kg Lowest weight since admission 72 kg on 8/3  I/O -1 L since admission Colostomy output 400 ml x 24 hours  Labs reviewed. Na 133, Phos 6.1,  BUN 127(8/1)-->40(8/2)-->110(8/3)-->57(8/6)-->187(8/8) CBG: 172-161  Medications reviewed and include Aranesp, Colace, novolog, Semglee, Rena-vit, Juven, Protonix, Miralax.    Diet Order:   Diet Order             Diet NPO time specified  Diet effective midnight                   EDUCATION NEEDS:   No education needs have been identified at this time  Skin:  Skin Assessment: Skin Integrity Issues: Skin Integrity Issues:: DTI, Stage IV DTI: L ear Stage IV: sacrum  Last BM:  8/7 Colostomy  Height:   Ht Readings from Last 1 Encounters:  07/31/20 5' 6"  (1.676 m)    Weight:   Wt Readings from Last 1 Encounters:  08/20/20 73.8 kg    Ideal Body Weight:  58 kg (adjusted for quadriplegia)  BMI:  Body mass index is 26.26 kg/m.  Estimated Nutritional Needs:   Kcal:  1900-2100  Protein:  110-120 gm  Fluid:  1 L + UOP    Lucas Mallow, RD, LDN, CNSC Please refer to Amion for contact information.

## 2020-08-20 NOTE — Progress Notes (Signed)
Patient ID: Luis Orr, male   DOB: 01-03-1951, 70 y.o.   MRN: [id number] [facility] Progress Note   Assessment/ Plan:   1. Acute kidney Injury-prolonged dialysis dependency: With baseline creatinine of around 0.4 and suffered dense ATN from septic shock-without any evidence of renal recovery to date.  Started CRRT on 7/6 for volume overload/anasarca and uremia.  After improving hemodynamic state, transitioned to intermittent hemodialysis on a MWF schedule with family wishing for continued aggressive care.  He will likely need long-term dialysis  - For now HD per MWF schedule  - BUN charted as 187 and see has historically fluctuated to near this range before.  Stop prosource TF for now and continue nepro - work-up other sources of elevated BUN (I.e. GI bleed) per primary team   2.  Septic shock/acute metabolic encephalopathy: Off pressors and antibiotics at this time and intermittently requiring midodrine/albumin with dialysis.  3.  Chronic respiratory failure: Status post tracheostomy with ventilator management per CCM  4.  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: Previously resident at [facility] but per charting unfortunately cannot be readmitted there now that he is on what appears to be long-term dialysis  5.  Chronic kidney disease/metabolic bone disease: Calcium and phosphorus level have up to now been under decent control with hemodialysis, not on binders currently. Follow with HD.  6. Anemic normocytic - mild; no indication for PRBCs   Subjective:    Had his last HD on 8/5 with 400 mL UF.  No urine output is charted. He is not able to provide additional hx. No emergent issues overnight  Review of systems: on chronic vent   Objective:   BP 117/69   Pulse 94   Temp 98.9 F (37.2 C) (Oral)   Resp (!) 30   Ht 5' 6"  (1.676 m)   Wt 73.8 kg   SpO2 96%   BMI 26.26 kg/m   Intake/Output Summary (Last 24 hours) at 08/20/2020 0831 Last data  filed at 08/20/2020 0600 Gross per 24 hour  Intake 810 ml  Output 400 ml  Net 410 ml   Weight change: -1.4 kg  Physical Exam:  General adult male in bed chronically ill appearing HEENT normocephalic atraumatic Neck trachea midline; on vent via trach Lungs coarse mechanical breath sounds Heart S1S2 no rub Abdomen soft nontender nondistended; PEG in place Extremities no edema  Neuro - functionally quadriplegic  Access: RIJ tunneled catheter in place  Imaging: No results found.  Labs: BMET Recent Labs  Lab 08/14/20 0444 08/15/20 0227 08/16/20 1051 08/18/20 0755 08/20/20 0145  NA 134* 133* 134* 133* 133*  K 3.0* 4.3 3.9 3.4* 4.9  CL 92* 93*  --  94* 94*  CO2 25 22  --  26 19*  GLUCOSE 178* 168*  --  201* 186*  BUN 40* 110*  --  57* 187*  CREATININE 0.65 1.13  --  0.88 1.77*  CALCIUM 10.2 9.7  --  9.5 9.6  PHOS 2.2* 5.1*  --  2.8 6.1*   CBC Recent Labs  Lab 08/14/20 0444 08/15/20 0227 08/16/20 1051 08/19/20 0143  WBC 17.5* 15.7*  --  14.6*  NEUTROABS 14.7* 11.5*  --   --   HGB 11.5* 9.8* 13.3 11.9*  HCT 35.8* 31.5* 39.0 38.6*  MCV 82.5 84.5  --  84.8  PLT 302 295  --  311    Medications:     amiodarone  200 mg Per Tube Daily   apixaban  5 mg Per Tube BID   buPROPion  75 mg Per Tube BID   chlorhexidine gluconate (MEDLINE KIT)  15 mL Mouth Rinse BID   Chlorhexidine Gluconate Cloth  6 each Topical Q0600   darbepoetin (ARANESP) injection - DIALYSIS  100 mcg Intravenous Q Wed-HD   docusate  100 mg Per Tube BID   feeding supplement (PROSource TF)  45 mL Per Tube TID   guaiFENesin  5 mL Per Tube BID   insulin aspart  0-15 Units Subcutaneous Q4H   insulin glargine-yfgn  30 Units Subcutaneous Daily   mouth rinse  15 mL Mouth Rinse 10 times per day   multivitamin  1 tablet Per Tube QHS   nutrition supplement (JUVEN)  1 packet Per Tube BID BM   pantoprazole sodium  40 mg Per Tube QHS   polyethylene glycol  17 g Per Tube Daily   sertraline  100 mg Per Tube Daily    sodium chloride flush  10-40 mL Intracatheter Q12H   Claudia Desanctis, MD 08/20/2020, 8:48 AM

## 2020-08-20 NOTE — Progress Notes (Signed)
NAME:  Luis Orr, MRN:  [id number], DOB:  04-29-1950, LOS: 30 ADMISSION DATE:  06/13/2020, CONSULTATION DATE:  07/02/2020 REFERRING MD:  Dr Francia Greaves, EDP CHIEF COMPLAINT:  septic shock    History of Present Illness:  70 yo male from [facility] with hx of C3-C5 compressive myelopathy with functional quadriplegia and chronic trach/vent presented to ED with altered mental status and hypotension with recurrent HCAP.  Has multiple recent admissions for sepsis secondary to various infections including UTIs, pneumonias and bacteremia in setting of MDR Klebsiella, MRSA and pseudomonas. Most recent admission September 2021 in setting of sepsis due to MDR Klebsiella with right ureteral calculus s/p double J ureteral stent placement and treated with meropenem x 10days.  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 Central line placed. Drainage from gastrostomy tube >> CT Abd without fluid collection around gastrostomy tube but with diffuse body wall edema. Echo EF 50-55%, mild LVH  7/6 Renal Replacement Therapy ; Code status changed to partial Code (DNR) 7/7 Transfuse 1U PRBC 7/9 add solu cortef 7/10 off pressors, weaning stress steroids  7/11 Pressors added back on again 7/12 ID consult due to rising WBC, hypothermia, hypotension concern for worsening infection. Vancomycin added. Repeat blood cx collected. 7/15 CRRT stopped   7/17 Restarted on low dose levo. No evidence of new infection. Held off on antibiotics. Nephrology contacted family and decision was made to continue iHD this week to allow additional time for renal recovery. Patient is not a long term iHD candidate  7/18 plans for iHD, tolerated with 2L removed  7/19 slight drop in hgb to 6.9 will transfuse 1 unit PRBC  7/20 To [facility], PCCM following for trach / vent needs 7/22>> tolerating HD, will need out of state placement 8/4 last PCCM visit. 8/4 -8/8: No issues. CM workin gon out of state placement   Interim History / Subjective:  No distress.   Objective   Blood pressure 117/69, pulse 94, temperature 98.9 F (37.2 C), temperature source Oral, resp. rate (Abnormal) 30, height '5\' 6"'$  (1.676 m), weight 73.8 kg, SpO2 96 %.    Vent Mode: PRVC FiO2 (%):  [30 %] 30 % Set Rate:  [30 bmp] 30 bmp Vt Set:  [380 mL] 380 mL PEEP:  [5 cmH20] 5 cmH20 Plateau Pressure:  [22 cmH20-34 cmH20] 22 cmH20   Intake/Output Summary (Last 24 hours) at 08/20/2020 0933 Last data filed at 08/20/2020 0600 Gross per 24 hour  Intake 720 ml  Output 400 ml  Net 320 ml   Filed Weights   08/17/20 0500 08/19/20 0108 08/20/20 0400  Weight: 73.3 kg 75.2 kg 73.8 kg    Physical Exam:  General this is a 70 year old male remains vent dependent HENT trach unremarkable w/ exception of yellow trach secretions Pulm scattered rhonchi Card RRR Abd soft has PEG tube  Neuro awake. Quad.    Resolved Hospital Problem list   Hyperkalemia Tachycardia Hypotension Thrombocytosis  AKI Septic shock 2nd to HCAP and UTI all present on admission -Acinetobacter in sputum culture from 07/12/20. Completed 14 days of Unasyn for -Of pressors on 4/14. Restarted  levo 7/17.  Assessment & Plan:   Acute on chronic hypoxic and hypercarbic respiratory failure secondary pna and volume overload Tracheostomy and ventilator dependent status. ESRD Hyperglycemia DNR status  Discussion Trach and vent  dependent in setting of C3-C5 compressive myelopathy No acute issues.  Stable on vent Plan Cont routine trach care VAP bundle All other interventions per primary team   Erick Colace ACNP-BC [place] Pager # [phone] OR # [phone] if no answer

## 2020-08-20 NOTE — Progress Notes (Signed)
PROGRESS NOTE    EPIC TRIBBETT  KZS:[id number] DOB: [birth year] DOA: 06/18/2020 PCP: Townsend Roger, MD   Brief Narrative: Luis Orr is a 70 y.o. 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 Patient presented secondary to altered mental status and hypotension with recurrent pneumonia requiring vasopressor support and ICU admission.   ICU significant events: 6/29 admitted to ICU for septic shock on levo to maintain MAP>65, continued on vent support 6/29 Blood Cx>> staph species in 1 of 4 cultures drawn>> suspect contaminant 6/30 Off pressors 7/2 ID Consult for MDR acinetobacter in trach asp, Meropenem changed to unasyn 7/2 PRBC transfusion, iron replacement 7/5 Pressors added again. Central line placed. Drainage from gastrostomy tube >> CT Abd without fluid collection around gastrostomy tube but with diffuse body wall edema. Echo EF 50-55%, mild LVH 7/6 Renal Replacement Therapy ; Code status changed to partial Code (DNR) 7/7 Transfuse 1U PRBC 7/9 add solu cortef 7/10 off pressors, weaning stress steroids 7/11 Pressors added back on again 7/12 ID consult due to rising WBC, hypothermia, hypotension concern for worsening infection. Vancomycin added. Repeat blood cx collected. 7/15 CRRT stopped 7/17 Restarted on low dose levo. No evidence of new infection. Held off on antibiotics. Nephrology contacted family and decision was made to continue iHD this week to allow additional time for renal recovery. Patient is not a long term iHD candidate 7/18 plans for iHD, tolerated with 2L removed 719 slight drop in hgb to 6.9 will transfuse 1 unit PRBC 7/22 > 7/26: Tolerating HD. TOC consulted for placement. Will likely need out of state placement.    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 Now resolved and back to baseline. Currently on ventilator support per PCCM and is baseline.  Ventilator associated pneumonia Present on admission. Sputum culture significant for acinetobacter baumannii. Patient was treated with Unasyn x14 days. Completed course.  Leukocytosis Secondary to above infection and sepsis. Significantly improved; worsened slightly but now seems to be improving.  Volume overload Secondary to renal failure with anuria. Patient has improved with CRRT and now iHD. Anasarca improved with HD.  AKI with oliguria Now appears patient is anuric. No documented urine output from last 24 hours. Nephrology is on board and was initially managing with CRRT and have transitioned to intermittent HD and tolerating. Issues with HD are that patient cannot go back to his SNF. Palliative care has been consulted and are following. [place] placed on 7/26 -Nephrology recommendations: Continuing HD  Septic shock Present on admission. Secondary to pneumonia. Patient required vasopressor support which is now weaned off. Resolved. Initial blood culture significant for likely contaminant. Repeat blood cultures negative.  Acute metabolic encephalopathy Secondary to acute illness. CT head negative for acute process. Appears improved and likely back at baseline.  Diabetes mellitus, type 2 Insulin needs have decreased. 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 Improving.  Quadriplegia  Secondary to compressive myelopathy. Patient resides at  [facility] but per chart review, [facility] will not accept him back if he is HD dependent.  Microcytic anemia Acute anemia Patient required 4 units of PRBC to date. Thought to be secondary to a combination of acute illness, chronic disease and iron deficiency. IV iron given. Stable.  Severe malnutrition -Continue tube feeds  Paroxysmal atrial fibrillation -Continue amiodarone  Pressure injury Medial/posterior sacrum present on admission. Right/posterior/lateral ankle unknown if present on admission. Left ear, not present on admission.   DVT prophylaxis: Heparin Code Status:   Code Status: Partial Code Family Communication: None at bedside Disposition Plan: Discharge pending ability to find placement. Medically stable for discharge to SNF with vent/HD. I called [facility] on 8/4 and left a message for admissions to get in touch with our [facility] team for coordination of transfer.   Consultants:  PCCM Nephrology Infectious disease Palliative care medicine  Procedures:    Antimicrobials: Unasyn IV Vancomycin Meropenem Cefepime   Subjective: Non-verbal  Objective: Vitals:   08/20/20 0600 08/20/20 0700 08/20/20 0805 08/20/20 1117  BP: (!) 79/51  117/69   Pulse: 93  94 100  Resp: (!) 30  (!) 30 (!) 30  Temp:  98.9 F (37.2 C)    TempSrc:  Oral    SpO2: 95%  96% 94%  Weight:      Height:        Intake/Output Summary (Last 24 hours) at 08/20/2020 1208 Last data filed at 08/20/2020 0600 Gross per 24 hour  Intake 585 ml  Output 400 ml  Net 185 ml    Filed Weights   08/17/20 0500 08/19/20 0108 08/20/20 0400  Weight: 73.3 kg 75.2 kg 73.8 kg    Examination:  General: Well appearing, no distress Respiratory: No distress  Data Reviewed: I have personally reviewed following labs and imaging studies  CBC Lab Results  Component Value Date   WBC 14.6 (H) 08/19/2020   RBC 4.55 08/19/2020   HGB 11.9 (L) 08/19/2020   HCT 38.6 (L) 08/19/2020   MCV 84.8 08/19/2020    MCH 26.2 08/19/2020   PLT 311 08/19/2020   MCHC 30.8 08/19/2020   RDW 21.4 (H) 08/19/2020   LYMPHSABS 1.6 08/15/2020   MONOABS 1.4 (H) 08/15/2020   EOSABS 0.6 (H) 08/15/2020   BASOSABS 0.3 (H) 48/18/5631     Last metabolic panel Lab Results  Component Value Date   NA 133 (L) 08/20/2020   K 4.9 08/20/2020   CL 94 (L) 08/20/2020   CO2 19 (L) 08/20/2020   BUN 187 (H) 08/20/2020   CREATININE 1.77 (H) 08/20/2020   GLUCOSE 186 (H) 08/20/2020   GFRNONAA 41 (L) 08/20/2020   GFRAA NOT CALCULATED 10/11/2019   CALCIUM 9.6 08/20/2020   PHOS 6.1 (H) 08/20/2020   PROT 6.2 (L) 08/01/2020   ALBUMIN 2.8 (L) 08/20/2020   BILITOT 1.0 08/01/2020   ALKPHOS 254 (H) 08/01/2020   AST 60 (H) 08/01/2020   ALT 69 (H) 08/01/2020   ANIONGAP 20 (H) 08/20/2020    CBG (last 3)  Recent Labs    08/20/20 0324 08/20/20 0802 08/20/20 1132  GLUCAP 172* 161* 172*      GFR: Estimated Creatinine Clearance: 35 mL/min (A) (by C-G formula based on SCr of 1.77 mg/dL (H)).  Coagulation Profile: No results for input(s): INR, PROTIME in the last 168 hours.  No results found for this or any previous visit (from the past 240 hour(s)).       Radiology  Studies: No results found.      Scheduled Meds:  amiodarone  200 mg Per Tube Daily   apixaban  5 mg Per Tube BID   buPROPion  75 mg Per Tube BID   chlorhexidine gluconate (MEDLINE KIT)  15 mL Mouth Rinse BID   Chlorhexidine Gluconate Cloth  6 each Topical Q0600   darbepoetin (ARANESP) injection - DIALYSIS  100 mcg Intravenous Q Wed-HD   docusate  100 mg Per Tube BID   guaiFENesin  5 mL Per Tube BID   insulin aspart  0-15 Units Subcutaneous Q4H   insulin glargine-yfgn  30 Units Subcutaneous Daily   mouth rinse  15 mL Mouth Rinse 10 times per day   multivitamin  1 tablet Per Tube QHS   nutrition supplement (JUVEN)  1 packet Per Tube BID BM   pantoprazole sodium  40 mg Per Tube QHS   polyethylene glycol  17 g Per Tube Daily   sertraline  100  mg Per Tube Daily   sodium chloride flush  10-40 mL Intracatheter Q12H   Continuous Infusions:  sodium chloride Stopped (07/26/20 0824)   sodium chloride 250 mL (07/31/20 1428)   sodium chloride     sodium chloride     feeding supplement (NEPRO CARB STEADY) 1,000 mL (08/20/20 0340)     LOS: 40 days     Cordelia Poche, MD [facility] 08/20/2020, 12:08 PM  If 7PM-7AM, please contact night-coverage [email or web address]

## 2020-08-20 NOTE — Progress Notes (Signed)
RT entered incorrect temp on I-stat for ABG '@1739'$  on accident. RT filled out [place] edit sheet with supervisor and sent it via tube station to #12 per POC edit sheet directions.  RT will continue to monitor.

## 2020-08-21 DIAGNOSIS — N186 End stage renal disease: Secondary | ICD-10-CM | POA: Diagnosis not present

## 2020-08-21 DIAGNOSIS — J9601 Acute respiratory failure with hypoxia: Secondary | ICD-10-CM | POA: Diagnosis not present

## 2020-08-21 DIAGNOSIS — A419 Sepsis, unspecified organism: Secondary | ICD-10-CM | POA: Diagnosis not present

## 2020-08-21 DIAGNOSIS — G8250 Quadriplegia, unspecified: Secondary | ICD-10-CM | POA: Diagnosis not present

## 2020-08-21 LAB — POCT I-STAT 7, (LYTES, BLD GAS, ICA,H+H)
Acid-Base Excess: 1 mmol/L (ref 0.0–2.0)
Bicarbonate: 25.6 mmol/L (ref 20.0–28.0)
Calcium, Ion: 1.17 mmol/L (ref 1.15–1.40)
HCT: 38 % — ABNORMAL LOW (ref 39.0–52.0)
Hemoglobin: 12.9 g/dL — ABNORMAL LOW (ref 13.0–17.0)
O2 Saturation: 98 %
Patient temperature: 97.7
Potassium: 4.1 mmol/L (ref 3.5–5.1)
Sodium: 136 mmol/L (ref 135–145)
TCO2: 27 mmol/L (ref 22–32)
pCO2 arterial: 39.9 mmHg (ref 32.0–48.0)
pH, Arterial: 7.414 (ref 7.350–7.450)
pO2, Arterial: 93 mmHg (ref 83.0–108.0)

## 2020-08-21 LAB — RENAL FUNCTION PANEL
Albumin: 2.8 g/dL — ABNORMAL LOW (ref 3.5–5.0)
Anion gap: 19 — ABNORMAL HIGH (ref 5–15)
BUN: 99 mg/dL — ABNORMAL HIGH (ref 8–23)
CO2: 19 mmol/L — ABNORMAL LOW (ref 22–32)
Calcium: 9.1 mg/dL (ref 8.9–10.3)
Chloride: 94 mmol/L — ABNORMAL LOW (ref 98–111)
Creatinine, Ser: 1.21 mg/dL (ref 0.61–1.24)
GFR, Estimated: >60 mL/min (ref >60)
Glucose, Bld: 173 mg/dL — ABNORMAL HIGH (ref 70–99)
Phosphorus: 4.6 mg/dL (ref 2.5–4.6)
Potassium: 4.4 mmol/L (ref 3.5–5.1)
Sodium: 132 mmol/L — ABNORMAL LOW (ref 135–145)

## 2020-08-21 LAB — GLUCOSE, CAPILLARY
Glucose-Capillary: 151 mg/dL — ABNORMAL HIGH (ref 70–99)
Glucose-Capillary: 154 mg/dL — ABNORMAL HIGH (ref 70–99)
Glucose-Capillary: 182 mg/dL — ABNORMAL HIGH (ref 70–99)
Glucose-Capillary: 195 mg/dL — ABNORMAL HIGH (ref 70–99)
Glucose-Capillary: 196 mg/dL — ABNORMAL HIGH (ref 70–99)
Glucose-Capillary: 167 mg/dL — ABNORMAL HIGH (ref 70–99)

## 2020-08-21 MED ORDER — INSULIN GLARGINE-YFGN 100 UNIT/ML ~~LOC~~ SOLN
10.0000 [IU] | Freq: Once | SUBCUTANEOUS | Status: AC
  Administered 2020-08-21: 10 [IU] via SUBCUTANEOUS
  Filled 2020-08-21: qty 0.1

## 2020-08-21 MED ORDER — CHLORHEXIDINE GLUCONATE CLOTH 2 % EX PADS
6.0000 | MEDICATED_PAD | Freq: Every day | CUTANEOUS | Status: DC
  Administered 2020-08-22 – 2020-08-27 (×3): 6 via TOPICAL

## 2020-08-21 MED ORDER — INSULIN GLARGINE-YFGN 100 UNIT/ML ~~LOC~~ SOLN
40.0000 [IU] | Freq: Every day | SUBCUTANEOUS | Status: DC
  Administered 2020-08-22 – 2020-08-26 (×5): 40 [IU] via SUBCUTANEOUS
  Filled 2020-08-21 (×6): qty 0.4

## 2020-08-21 NOTE — Progress Notes (Signed)
PROGRESS NOTE    Luis Orr  ZOX:[id number] DOB: 1950-11-20 DOA: 07/03/2020 PCP: Townsend Roger, MD   Brief Narrative: Luis Orr is a 70 y.o. 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 Patient presented secondary to altered mental status and hypotension with recurrent pneumonia requiring vasopressor support and ICU admission.   ICU significant events: 6/29 admitted to ICU for septic shock on levo to maintain MAP>65, continued on vent support 6/29 Blood Cx>> staph species in 1 of 4 cultures drawn>> suspect contaminant 6/30 Off pressors 7/2 ID Consult for MDR acinetobacter in trach asp, Meropenem changed to unasyn 7/2 PRBC transfusion, iron replacement 7/5 Pressors added again. Central line placed. Drainage from gastrostomy tube >> CT Abd without fluid collection around gastrostomy tube but with diffuse body wall edema. Echo EF 50-55%, mild LVH 7/6 Renal Replacement Therapy ; Code status changed to partial Code (DNR) 7/7 Transfuse 1U PRBC 7/9 add solu cortef 7/10 off pressors, weaning stress steroids 7/11 Pressors added back on again 7/12 ID consult due to rising WBC, hypothermia, hypotension concern for worsening infection. Vancomycin added. Repeat blood cx collected. 7/15 CRRT stopped 7/17 Restarted on low dose levo. No evidence of new infection. Held off on antibiotics. Nephrology contacted family and decision was made to continue iHD this week to allow additional time for renal recovery. Patient is not a long term iHD candidate 7/18 plans for iHD, tolerated with 2L removed 719 slight drop in hgb to 6.9 will transfuse 1 unit PRBC 7/22 > 7/26: Tolerating HD. TOC consulted for placement. Will likely need out of state placement.    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 Now resolved and back to baseline. Currently on ventilator support per PCCM and is baseline.  Ventilator associated pneumonia Present on admission. Sputum culture significant for acinetobacter baumannii. Patient was treated with Unasyn x14 days. Completed course.  Leukocytosis Secondary to above infection and sepsis. Significantly improved; worsened slightly but now seems to be improving.  Volume overload Secondary to renal failure with anuria. Patient has improved with CRRT and now iHD. Anasarca improved with HD.  AKI with oliguria Now appears patient is anuric. No documented urine output from last 24 hours. Nephrology is on board and was initially managing with CRRT and have transitioned to intermittent HD and tolerating. Issues with HD are that patient cannot go back to his SNF. Palliative care has been consulted and are following. [place] placed on 7/26 -Nephrology recommendations: Continuing HD  Septic shock Present on admission. Secondary to pneumonia. Patient required vasopressor support which is now weaned off. Resolved. Initial blood culture significant for likely contaminant. Repeat blood cultures negative.  Acute metabolic encephalopathy Secondary to acute illness. CT head negative for acute process. Appears improved and likely back at baseline.  Diabetes mellitus, type 2 Insulin needs have decreased. -Increase to insulin glargine 40 units daily, continue SSI -Adjust glargine dosing based on SSI  Vasoplegia Patient was on midodrine which is now discontinued. Blood pressure low with HD.  Shock liver Secondary to septic shock. Improving.  Quadriplegia Secondary to  compressive myelopathy. Patient resides at [facility] but per  chart review, [facility] will not accept him back if he is HD dependent.  Microcytic anemia Acute anemia Patient required 4 units of PRBC to date. Thought to be secondary to a combination of acute illness, chronic disease and iron deficiency. IV iron given. Stable.  Severe malnutrition -Continue tube feeds  Paroxysmal atrial fibrillation -Continue amiodarone  Pressure injury Medial/posterior sacrum present on admission. Right/posterior/lateral ankle unknown if present on admission. Left ear, not present on admission.   DVT prophylaxis: Heparin Code Status:   Code Status: Partial Code Family Communication: None at bedside Disposition Plan: Discharge pending ability to find placement. Medically stable for discharge to SNF with vent/HD.   Consultants:  PCCM Nephrology Infectious disease Palliative care medicine  Procedures:    Antimicrobials: Unasyn IV Vancomycin Meropenem Cefepime   Subjective: Non-verbal  Objective: Vitals:   08/21/20 0742 08/21/20 0800 08/21/20 0900 08/21/20 1000  BP:  107/64 115/76   Pulse:  95 93 95  Resp:  (!) 30    Temp: 98.8 F (37.1 C)     TempSrc: Axillary     SpO2:  96%    Weight:      Height:        Intake/Output Summary (Last 24 hours) at 08/21/2020 1125 Last data filed at 08/20/2020 1800 Gross per 24 hour  Intake 315 ml  Output 994 ml  Net -679 ml    Filed Weights   08/20/20 1246 08/20/20 1615 08/21/20 0441  Weight: 74.8 kg 74.2 kg 74.6 kg    Examination:  General exam: Appears calm and comfortable Respiratory system: Mild rhonchi. Respiratory effort normal. Cardiovascular system: S1 & S2 heard, RRR. No murmurs, rubs, gallops or clicks. Gastrointestinal system: Abdomen is nondistended, soft and nontender. No organomegaly or masses felt. Normal bowel sounds heard. Central nervous system: Alert. Musculoskeletal: Trace edema. No calf tenderness Skin: No cyanosis. No new rashes    Data Reviewed: I have personally reviewed  following labs and imaging studies  CBC Lab Results  Component Value Date   WBC 14.6 (H) 08/19/2020   RBC 4.55 08/19/2020   HGB 12.9 (L) 08/21/2020   HCT 38.0 (L) 08/21/2020   MCV 84.8 08/19/2020   MCH 26.2 08/19/2020   PLT 311 08/19/2020   MCHC 30.8 08/19/2020   RDW 21.4 (H) 08/19/2020   LYMPHSABS 1.6 08/15/2020   MONOABS 1.4 (H) 08/15/2020   EOSABS 0.6 (H) 08/15/2020   BASOSABS 0.3 (H) 16/10/9602     Last metabolic panel Lab Results  Component Value Date   NA 136 08/21/2020   K 4.1 08/21/2020   CL 94 (L) 08/21/2020   CO2 19 (L) 08/21/2020   BUN 99 (H) 08/21/2020   CREATININE 1.21 08/21/2020   GLUCOSE 173 (H) 08/21/2020   GFRNONAA >60 08/21/2020   GFRAA NOT CALCULATED 10/11/2019   CALCIUM 9.1 08/21/2020   PHOS 4.6 08/21/2020   PROT 6.2 (L) 08/01/2020   ALBUMIN 2.8 (L) 08/21/2020   BILITOT 1.0 08/01/2020   ALKPHOS 254 (H) 08/01/2020   AST 60 (H) 08/01/2020   ALT 69 (H) 08/01/2020   ANIONGAP 19 (H) 08/21/2020    CBG (last 3)  Recent Labs    08/20/20 2330 08/21/20 0336 08/21/20 0738  GLUCAP 195* 151* 154*      GFR: Estimated Creatinine Clearance: 51.3 mL/min (by C-G formula based on SCr of 1.21 mg/dL).  Coagulation Profile: No results for input(s): INR, PROTIME in the last 168 hours.  No results found  for this or any previous visit (from the past 240 hour(s)).       Radiology Studies: No results found.      Scheduled Meds:  amiodarone  200 mg Per Tube Daily   apixaban  5 mg Per Tube BID   buPROPion  75 mg Per Tube BID   chlorhexidine gluconate (MEDLINE KIT)  15 mL Mouth Rinse BID   Chlorhexidine Gluconate Cloth  6 each Topical Q0600   docusate  100 mg Per Tube BID   guaiFENesin  5 mL Per Tube BID   insulin aspart  0-15 Units Subcutaneous Q4H   insulin glargine-yfgn  30 Units Subcutaneous Daily   mouth rinse  15 mL Mouth Rinse 10 times per day   multivitamin  1 tablet Per Tube QHS   nutrition supplement (JUVEN)  1 packet Per Tube BID  BM   pantoprazole sodium  40 mg Per Tube QHS   polyethylene glycol  17 g Per Tube Daily   sertraline  100 mg Per Tube Daily   sodium chloride flush  10-40 mL Intracatheter Q12H   Continuous Infusions:  sodium chloride Stopped (07/26/20 0824)   sodium chloride 250 mL (07/31/20 1428)   sodium chloride     sodium chloride     feeding supplement (NEPRO CARB STEADY) 1,000 mL (08/21/20 0922)     LOS: 41 days     Cordelia Poche, MD [facility] 08/21/2020, 11:25 AM  If 7PM-7AM, please contact night-coverage [email or web address]

## 2020-08-21 NOTE — Progress Notes (Signed)
Patient ID: Luis Orr, male   DOB: 1950-09-03, 70 y.o.   MRN: [id number] [facility] Progress Note   Assessment/ Plan:   1. Acute kidney Injury-prolonged dialysis dependency: With baseline creatinine of around 0.4 and suffered dense ATN from septic shock-without any evidence of renal recovery to date.  Started CRRT on 7/6 for volume overload/anasarca and uremia.  After improving hemodynamic state, transitioned to intermittent hemodialysis on a MWF schedule with family wishing for continued aggressive care.  He will likely need long-term dialysis  - For now HD per MWF schedule  - BUN charted as 187 on 8/8 and see he has historically fluctuated to near this range before.  Stopped prosource TF for now and continued nepro - work-up other sources of elevated BUN (I.e. GI bleed) per primary team   2.  Septic shock/acute metabolic encephalopathy: Off pressors and antibiotics at this time and charted as intermittently requiring midodrine/albumin with dialysis.  3.  Chronic respiratory failure: Status post tracheostomy with ventilator management per CCM  4.  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: Previously resident at [facility] but per charting unfortunately cannot be readmitted there now that he is on what appears to be long-term dialysis  5.  Chronic kidney disease/metabolic bone disease: Calcium and phosphorus level have up to now been under decent control with hemodialysis, not on binders currently. improved with HD.  follow  6. Anemic normocytic - mild; no indication for PRBCs.  Hold aranesp for now - prev on 100 mcg every wed per charting.  Dispo Per charting previously resident at [facility] but per charting unfortunately cannot be readmitted there now that he is on what appears to be long-term dialysis  Subjective:    Last HD on 8/8 with 1 kg UF.  No urine output is charted over 8/8. Patient not able to offer additional hx.  He has been tracking RN  which is his baseline per report   Review of systems: on chronic vent. Not able to obtain   Objective:   BP 126/70   Pulse 99   Temp 98.8 F (37.1 C) (Axillary)   Resp (!) 30   Ht _0  (1.676 m)   Wt 74.6 kg   SpO2 97%   BMI 26.55 kg/m   Intake/Output Summary (Last 24 hours) at 08/21/2020 0839 Last data filed at 08/20/2020 1800 Gross per 24 hour  Intake 450 ml  Output 994 ml  Net -544 ml   Weight change: 1 kg  Physical Exam:   General adult male in bed chronically ill appearing HEENT normocephalic atraumatic Neck trachea midline; on vent via trach; FiO2 30 and PEEP 5 Lungs clear anteriorly on auscultation Heart S1S2 no rub Abdomen soft nontender nondistended; ostomy in place; PEG in place Extremities no edema  Neuro - functionally quadriplegic  Access: RIJ tunneled catheter in place  Imaging: No results found.  Labs: BMET Recent Labs  Lab 08/15/20 0227 08/16/20 1051 08/18/20 0755 08/20/20 0145 08/20/20 1739 08/21/20 0311 08/21/20 0415  NA 133* 134* 133* 133* 137 132* 136  K 4.3 3.9 3.4* 4.9 3.0* 4.4 4.1  CL 93*  --  94* 94*  --  94*  --   CO2 22  --  26 19*  --  19*  --   GLUCOSE 168*  --  201* 186*  --  173*  --   BUN 110*  --  57* 187*  --  99*  --   CREATININE 1.13  --  0.88 1.77*  --  1.21  --   CALCIUM 9.7  --  9.5 9.6  --  9.1  --   PHOS 5.1*  --  2.8 6.1*  --  4.6  --    CBC Recent Labs  Lab 08/15/20 0227 08/16/20 1051 08/19/20 0143 08/20/20 1739 08/21/20 0415  WBC 15.7*  --  14.6*  --   --   NEUTROABS 11.5*  --   --   --   --   HGB 9.8* 13.3 11.9* 12.9* 12.9*  HCT 31.5* 39.0 38.6* 38.0* 38.0*  MCV 84.5  --  84.8  --   --   PLT 295  --  311  --   --     Medications:     amiodarone  200 mg Per Tube Daily   apixaban  5 mg Per Tube BID   buPROPion  75 mg Per Tube BID   chlorhexidine gluconate (MEDLINE KIT)  15 mL Mouth Rinse BID   Chlorhexidine Gluconate Cloth  6 each Topical Q0600   darbepoetin (ARANESP) injection - DIALYSIS  100  mcg Intravenous Q Wed-HD   docusate  100 mg Per Tube BID   guaiFENesin  5 mL Per Tube BID   insulin aspart  0-15 Units Subcutaneous Q4H   insulin glargine-yfgn  30 Units Subcutaneous Daily   mouth rinse  15 mL Mouth Rinse 10 times per day   multivitamin  1 tablet Per Tube QHS   nutrition supplement (JUVEN)  1 packet Per Tube BID BM   pantoprazole sodium  40 mg Per Tube QHS   polyethylene glycol  17 g Per Tube Daily   sertraline  100 mg Per Tube Daily   sodium chloride flush  10-40 mL Intracatheter Q12H   Claudia Desanctis, MD 08/21/2020, 8:51 AM

## 2020-08-21 NOTE — Progress Notes (Signed)
Assisted family with tele visit

## 2020-08-21 NOTE — TOC Progression Note (Signed)
Transition of Care [facility]) - Progression Note    Patient Details  Name: Luis Orr MRN: [id number] Date of Birth: [birth year]  Transition of Care [facility]) CM/SW Contact  Milinda Antis, [place] Phone Number: 08/21/2020, 4:37 PM  Clinical Narrative:     CSW received a returned call from the patient's wife.  CSW informed the patient's wife that [facility] in [place] is unable to accept the patient and that CSW is now calling different states to inquire about whether they will accept the patient.  CSW informed the wife that CSW spoke with a facility in [place], [place]. That is looking over the patient's referral to see if they will be able to accept.  The patient's wife does not want the patient to be that far away and is requesting a meeting between [place] and the family tomorrow at 11:15am.  CSW called [facility] [phone]) to inquire about the referral.  Carollee Leitz with admissions informed CSW that she will review the referral and inform CSW of the decision.   Expected Discharge Plan: [place] Barriers to Discharge: Continued Medical Work up  Expected Discharge Plan and Services Expected Discharge Plan: [place] arrangements for the past 2 months: Post-Acute Facility                                       Social Determinants of Health (SDOH) Interventions    Readmission Risk Interventions No flowsheet data found.

## 2020-08-22 DIAGNOSIS — A419 Sepsis, unspecified organism: Secondary | ICD-10-CM | POA: Diagnosis not present

## 2020-08-22 DIAGNOSIS — J158 Pneumonia due to other specified bacteria: Secondary | ICD-10-CM

## 2020-08-22 DIAGNOSIS — A4159 Other Gram-negative sepsis: Secondary | ICD-10-CM | POA: Diagnosis not present

## 2020-08-22 DIAGNOSIS — Z9911 Dependence on respirator [ventilator] status: Secondary | ICD-10-CM

## 2020-08-22 DIAGNOSIS — J9621 Acute and chronic respiratory failure with hypoxia: Secondary | ICD-10-CM | POA: Diagnosis not present

## 2020-08-22 DIAGNOSIS — A4154 Sepsis due to Acinetobacter baumannii: Secondary | ICD-10-CM

## 2020-08-22 LAB — CBC
HCT: 34.7 % — ABNORMAL LOW (ref 39.0–52.0)
Hemoglobin: 11.3 g/dL — ABNORMAL LOW (ref 13.0–17.0)
MCH: 26.7 pg (ref 26.0–34.0)
MCHC: 32.6 g/dL (ref 30.0–36.0)
MCV: 82 fL (ref 80.0–100.0)
Platelets: 358 10*3/uL (ref 150–400)
RBC: 4.23 MIL/uL (ref 4.22–5.81)
RDW: 20 % — ABNORMAL HIGH (ref 11.5–15.5)
WBC: 13.3 10*3/uL — ABNORMAL HIGH (ref 4.0–10.5)
nRBC: 1.9 % — ABNORMAL HIGH (ref 0.0–0.2)

## 2020-08-22 LAB — GLUCOSE, CAPILLARY
Glucose-Capillary: 123 mg/dL — ABNORMAL HIGH (ref 70–99)
Glucose-Capillary: 127 mg/dL — ABNORMAL HIGH (ref 70–99)
Glucose-Capillary: 130 mg/dL — ABNORMAL HIGH (ref 70–99)
Glucose-Capillary: 139 mg/dL — ABNORMAL HIGH (ref 70–99)
Glucose-Capillary: 148 mg/dL — ABNORMAL HIGH (ref 70–99)
Glucose-Capillary: 155 mg/dL — ABNORMAL HIGH (ref 70–99)

## 2020-08-22 LAB — RENAL FUNCTION PANEL
Albumin: 2.6 g/dL — ABNORMAL LOW (ref 3.5–5.0)
Anion gap: 20 — ABNORMAL HIGH (ref 5–15)
BUN: 150 mg/dL — ABNORMAL HIGH (ref 8–23)
CO2: 20 mmol/L — ABNORMAL LOW (ref 22–32)
Calcium: 9.3 mg/dL (ref 8.9–10.3)
Chloride: 91 mmol/L — ABNORMAL LOW (ref 98–111)
Creatinine, Ser: 1.68 mg/dL — ABNORMAL HIGH (ref 0.61–1.24)
GFR, Estimated: 43 mL/min — ABNORMAL LOW (ref >60)
Glucose, Bld: 152 mg/dL — ABNORMAL HIGH (ref 70–99)
Phosphorus: 5.4 mg/dL — ABNORMAL HIGH (ref 2.5–4.6)
Potassium: 4.9 mmol/L (ref 3.5–5.1)
Sodium: 131 mmol/L — ABNORMAL LOW (ref 135–145)

## 2020-08-22 LAB — HEPATIC FUNCTION PANEL
ALT: 32 U/L (ref 0–44)
AST: 46 U/L — ABNORMAL HIGH (ref 15–41)
Albumin: 2.5 g/dL — ABNORMAL LOW (ref 3.5–5.0)
Alkaline Phosphatase: 248 U/L — ABNORMAL HIGH (ref 38–126)
Bilirubin, Direct: 0.3 mg/dL — ABNORMAL HIGH (ref 0.0–0.2)
Indirect Bilirubin: 0.8 mg/dL (ref 0.3–0.9)
Total Bilirubin: 1.1 mg/dL (ref 0.3–1.2)
Total Protein: 8.1 g/dL (ref 6.5–8.1)

## 2020-08-22 MED ORDER — MIDODRINE HCL 5 MG PO TABS: 2.5000 mg | ORAL_TABLET | ORAL | Status: DC

## 2020-08-22 MED ORDER — HEPARIN SODIUM (PORCINE) 1000 UNIT/ML IJ SOLN
INTRAMUSCULAR | Status: AC
  Filled 2020-08-22: qty 4

## 2020-08-22 NOTE — Progress Notes (Signed)
CSW spoke with Raquel Sarna at [facility] to request she review patient's clinicals.  CSW spoke with Anderson Malta at [facility]	[place] who states there are no dialysis beds available.  Madilyn Fireman, MSW, LCSW Transitions of Care  Clinical Social Worker II [phone]

## 2020-08-22 NOTE — Progress Notes (Signed)
Patient ID: Luis Orr, male   DOB: Nov 19, 1950, 70 y.o.   MRN: [id number] [facility] Progress Note   Assessment/ Plan:   1. Acute kidney Injury-prolonged dialysis dependency: With baseline creatinine of around 0.4 and suffered dense ATN from septic shock-without any evidence of renal recovery to date.  Started CRRT on 7/6 for volume overload/anasarca and uremia.  After improving hemodynamic state, transitioned to intermittent hemodialysis on a MWF schedule with family wishing for continued aggressive care.  He will likely need long-term dialysis  - For now HD per MWF schedule  - BUN elevated out of proportion to Cr.  He may have very little muscle mass.  Note BUN charted as 187 on 8/8 and see he has historically fluctuated to near this range before.  I stopped prosource and juven. continued nepro. Work-up other sources of elevated BUN (I.e. GI bleed) per primary team - I contacted them today  2.  Septic shock/acute metabolic encephalopathy: Off pressors and antibiotics at this time   3.  Chronic respiratory failure: Status post tracheostomy with ventilator management per CCM  4.  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: Previously resident at [facility] but per charting unfortunately cannot be readmitted there now that he is on what appears to be long-term dialysis  5.  Chronic kidney disease/metabolic bone disease: Calcium and phosphorus level have up to now been under decent control with hemodialysis, not on binders currently. improved with HD.  follow  6. Anemic normocytic - mild; no indication for PRBCs.  Hold aranesp for now - prev on 100 mcg every wed per charting.  Dispo Per charting previously resident at [facility] but per charting unfortunately cannot be readmitted there now that he is on what appears to be long-term dialysis  Subjective:    Last HD on 8/8 with 1 kg UF.  No urine output is charted over 8/9. Patient not able to offer  additional hx.  Per nursing he has been tracking visually per his baseline and he does this for me as well.  Pressures have been ok without midodrine with maps near 75 when awake per nursing.  Review of systems: on chronic vent. Not able to obtain    Objective:   BP (!) 106/58   Pulse 91   Temp 99.2 F (37.3 C) (Oral)   Resp (!) 30   Ht 5' 6"  (1.676 m)   Wt 75.4 kg   SpO2 100%   BMI 26.83 kg/m   Intake/Output Summary (Last 24 hours) at 08/22/2020 0825 Last data filed at 08/22/2020 0400 Gross per 24 hour  Intake 830 ml  Output 700 ml  Net 130 ml   Weight change: 0.6 kg  Physical Exam:   General adult male in bed chronically ill appearing   HEENT normocephalic atraumatic Neck trachea midline; on vent via trach; FiO2 30 and PEEP 5 Lungs clear anteriorly on auscultation Heart S1S2 no rub Abdomen soft nontender nondistended; ostomy in place; PEG in place Extremities no edema  Neuro - tracks visually; functionally quadriplegic Access: RIJ tunneled catheter in place  Imaging: No results found.  Labs: BMET Recent Labs  Lab 08/16/20 1051 08/18/20 0755 08/20/20 0145 08/20/20 1739 08/21/20 0311 08/21/20 0415 08/22/20 0217  NA 134* 133* 133* 137 132* 136 131*  K 3.9 3.4* 4.9 3.0* 4.4 4.1 4.9  CL  --  94* 94*  --  94*  --  91*  CO2  --  26 19*  --  19*  --  20*  GLUCOSE  --  201* 186*  --  173*  --  152*  BUN  --  57* 187*  --  99*  --  150*  CREATININE  --  0.88 1.77*  --  1.21  --  1.68*  CALCIUM  --  9.5 9.6  --  9.1  --  9.3  PHOS  --  2.8 6.1*  --  4.6  --  5.4*   CBC Recent Labs  Lab 08/19/20 0143 08/20/20 1739 08/21/20 0415 08/22/20 0217  WBC 14.6*  --   --  13.3*  HGB 11.9* 12.9* 12.9* 11.3*  HCT 38.6* 38.0* 38.0* 34.7*  MCV 84.8  --   --  82.0  PLT 311  --   --  358    Medications:     amiodarone  200 mg Per Tube Daily   apixaban  5 mg Per Tube BID   buPROPion  75 mg Per Tube BID   chlorhexidine gluconate (MEDLINE KIT)  15 mL Mouth Rinse BID    Chlorhexidine Gluconate Cloth  6 each Topical Q0600   Chlorhexidine Gluconate Cloth  6 each Topical Q0600   docusate  100 mg Per Tube BID   guaiFENesin  5 mL Per Tube BID   insulin aspart  0-15 Units Subcutaneous Q4H   insulin glargine-yfgn  40 Units Subcutaneous Daily   mouth rinse  15 mL Mouth Rinse 10 times per day   midodrine  2.5 mg Oral 2 times per day on Mon Wed Fri   multivitamin  1 tablet Per Tube QHS   nutrition supplement (JUVEN)  1 packet Per Tube BID BM   pantoprazole sodium  40 mg Per Tube QHS   polyethylene glycol  17 g Per Tube Daily   sertraline  100 mg Per Tube Daily   sodium chloride flush  10-40 mL Intracatheter Q12H   Claudia Desanctis, MD 08/22/2020, 8:38 AM

## 2020-08-22 NOTE — Procedures (Signed)
Tracheostomy Change Note  Patient Details:   Name: Luis Orr DOB: [birth year] MRN: [id number]    Airway Documentation:     Evaluation  O2 sats: stable throughout Complications: No apparent complications Patient did tolerate procedure well. Bilateral Breath Sounds: Clear, Diminished    RRTx2 changed patients trach from #6shiley proximal XLT to a new #6shiley proximal XLT per MD order. Good color change noted. No complications and vital signs stable. Patient tolerated well. RT will continue to monitor.   Fabiola Backer 08/22/2020, 3:58 PM

## 2020-08-22 NOTE — TOC Progression Note (Signed)
Transition of Care [facility]) - Progression Note    Patient Details  Name: Luis Orr MRN: [id number] Date of Birth: 14-Nov-1950  Transition of Care [facility]) CM/SW Contact  Milinda Antis, [place] Phone Number: 08/22/2020, 11:10 AM  Clinical Narrative:    09:00- CSW received a call from the patient's wife and was informed that the family meeting for today will need to be cancelled due to bad weather where the family lives.  CSW informed the spouse that the patient will be transferred to the DTP team.  09:13- CSW received a call from the patient's sister inquiring about placement of the patient.  The sister informed CSW that she spoke with the patient's spouse and was informed that CSW contacted an agency in [place] and that this would be to far for the patient to go.  CSW informed the sister of the denial from the facility in [place] and the patient's sister replied "that was too far too".  CSW encouraged collaboration between the family and CSW with the sister assisting the team with researching facilities close to the area who accept both ventilators, HD, and [employer]  DTP team will be following the patient going forward.  Expected Discharge Plan: [place] Barriers to Discharge: Continued Medical Work up  Expected Discharge Plan and Services Expected Discharge Plan: [place] arrangements for the past 2 months: Post-Acute Facility                                       Social Determinants of Health (SDOH) Interventions    Readmission Risk Interventions No flowsheet data found.

## 2020-08-22 NOTE — Progress Notes (Signed)
[facility] PROGRESS NOTE  Luis Orr SPQ:[id number] DOB: Nov 22, 1950 DOA: 07/04/2020 PCP: Townsend Roger, MD  Status: Remains inpatient appropriate because:Hemodynamically unstable, Persistent severe electrolyte disturbances, Unsafe d/c plan, and Inpatient level of care appropriate due to severity of illness  Dispo: The patient is from:  [facility]              Anticipated d/c is to: SNF Vent capable w/ access to HD              Patient currently is not medically stable to d/c.   Difficult to place patient Yes    Level of care: ICU  Code Status: Partial (no CPR and no defibrillation or cardioversion) Family Communication:  DVT prophylaxis: Eliquis COVID vaccination status: Unknown    HPI:  70 y.o. 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 Patient presented secondary to altered mental status and hypotension with recurrent pneumonia requiring vasopressor support and ICU admission.he has had multiple recent admissions for sepsis secondary to various infections including UTIs, pneumonias and bacteremia in setting of MDR Klebsiella, MRSA and pseudomonas. Most recent admission September 2021 in setting of sepsis due to MDR Klebsiella with right ureteral calculus s/p double J ureteral stent placement and treated with meropenem x 10days. He was discharged back to [facility].  ICU significant events: 6/29 admitted to ICU for septic shock on levo to maintain MAP>65, continued on vent support 6/29 Blood Cx>> staph species in 1 of 4 cultures drawn>> suspect contaminant 6/30 Off pressors 7/2 ID Consult for MDR acinetobacter in trach asp, Meropenem changed to unasyn 7/2 PRBC transfusion, iron replacement 7/5 Pressors added again. Central line placed. Drainage from gastrostomy tube >> CT Abd without fluid collection around gastrostomy tube but with diffuse body wall edema. Echo EF 50-55%, mild LVH 7/6 Renal  Replacement Therapy ; Code status changed to partial Code (DNR) 7/7 Transfuse 1U PRBC 7/9 add solu cortef 7/10 off pressors, weaning stress steroids 7/11 Pressors added back on again 7/12 ID consult due to rising WBC, hypothermia, hypotension concern for worsening infection. Vancomycin added. Repeat blood cx collected. 7/15 CRRT stopped 7/17 Restarted on low dose levo. No evidence of new infection. Held off on antibiotics. Nephrology contacted family and decision was made to continue iHD this week to allow additional time for renal recovery. Patient is not the best long term iHD candidate but family wants aggressive care 7/18 plans for iHD, tolerated with 2L removed 719 slight drop in hgb to 6.9 will transfuse 1 unit PRBC 7/22 > 7/26: Tolerating HD. TOC consulted for placement. Will likely need out of state placement.   Subjective: Nonverbal-minimal eye contact and only when lids manually opened by examiner  Objective: Vitals:   08/22/20 0700 08/22/20 0734  BP: (!) 106/58   Pulse: 91   Resp: (!) 30   Temp:  99.2 F (37.3 C)  SpO2: 100%     Intake/Output Summary (Last 24 hours) at 08/22/2020 0738 Last data filed at 08/22/2020 0400 Gross per 24 hour  Intake 875 ml  Output 700 ml  Net 175 ml   Filed Weights   08/20/20 1615 08/21/20 0441 08/22/20 0359  Weight: 74.2 kg 74.6 kg 75.4 kg    Exam:  Constitutional: NAD, calm, appears comfortable Respiratory: Trach: 6.0 XLT cuffed, Vent settings: PRVC mode, Fio2 30%, rate 30, VT 380, PEEP 5, clear to auscultation bilaterally, no wheezing, no crackles. Normal respiratory effort per vent (diaphragm paralyzed).  Cardiovascular:  SR, no murmurs / rubs / gallops. Suboptimal BP but no hypotension. No extremity edema. 1+ pedal pulses. No JVD Abdomen: no tenderness, no masses palpated. Bowel sounds positive. LLQ colostomy, PEG tube for feeds Neurologic: CN 2-12 grossly intact on visual inspection- pt unable to participate w/ exam. Known  quadraplegia at C3-C5 level Psychiatric: Nonverbal and unable to assess.   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 Now resolved and back to baseline.  Currently on ventilator support per PCCM and is baseline.   Ventilator associated pneumonia Present on admission.  Sputum culture significant for acinetobacter baumannii/on contact precautions. Completed Unasyn x14 days.  Persistent but improving leukocytosis and intermittent low-grade fever spikes.  ID has been following at times   AKI with oliguria/volume overload/ New CKD requiring HD Now appears patient is anuric. Nephrology is on board and was initially managing with CRRT and have transitioned to intermittent HD and tolerating.   Palliative care has been consulted and are following.  TDC placed on 7/26-family wants aggressive measures so anticipate will need permanent access -Nephrology recommendations: Continuing HD   Septic shock 2/2 VAP Resolved Present on admission. Secondary to pneumonia. Patient required vasopressor support which is now weaned off.  Initial blood culture likely contaminant. Repeat blood cultures negative.  History of paroxysmal atrial fibrillation Patient was on amiodarone prior to admission  Currently maintaining sinus rhythm Last twelve-lead EKG was done July 29, 2020 and demonstrated sinus rhythm with a QTC of 383 Will repeat EKG today and check periodically to follow QTC TSH was 0.922 on 04/18/2861   Acute metabolic encephalopathy Resolved Secondary to acute illness. CT head negative for acute process.  Reportedly back at baseline.   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   Shock liver Secondary to septic shock. Improving. 7/20 last labs that consisted of hepatic function studies demonstrated an AST of 60 and an ALT of 69 with normal bilirubin-obtain hepatic function panel today on 8/10   Quadriplegia secondary to compressive myelopathy.  Previously had [facility] but with poor performance scores as well as poor rehabilitation potential [facility] will not accept him back if he is HD dependent since he must be able to sit up in a chair for dialysis.   Microcytic anemia/Acute anemia Patient required 4 units of PRBC to date. Thought to be secondary to a combination of acute illness, chronic disease and iron deficiency. IV iron given. Stable.   Severe protein calorie malnutrition Nutrition Problem: Increased nutrient needs Etiology: wound healing Signs/Symptoms: estimated needs Interventions: Tube feeding, Prostat, MVI Estimated body mass index is 26.83 kg/m as calculated from the following:   Height as of this encounter: 5' 6"  (1.676 m).   Weight as of this encounter: 75.4 kg.   Pressure injury Stage IV medial/posterior sacrum present on admission.  Right/posterior/lateral ankle unknown if present on admission.  DTI left ear, not present on admission. (4 additional documentation see wound care notes)    Data Reviewed: Basic Metabolic Panel: Recent Labs  Lab 08/18/20 0755 08/20/20 0145 08/20/20 1739 08/21/20 0311 08/21/20 0415 08/22/20 0217  NA 133* 133* 137 132* 136 131*  K 3.4* 4.9 3.0* 4.4 4.1  4.9  CL 94* 94*  --  94*  --  91*  CO2 26 19*  --  19*  --  20*  GLUCOSE 201* 186*  --  173*  --  152*  BUN 57* 187*  --  99*  --  150*  CREATININE 0.88 1.77*  --  1.21  --  1.68*  CALCIUM 9.5 9.6  --  9.1  --  9.3  PHOS 2.8 6.1*  --  4.6  --  5.4*   Liver Function Tests: Recent Labs  Lab 08/18/20 0755 08/20/20 0145  08/21/20 0311 08/22/20 0217  ALBUMIN 3.2* 2.8* 2.8* 2.6*   No results for input(s): LIPASE, AMYLASE in the last 168 hours. No results for input(s): AMMONIA in the last 168 hours. CBC: Recent Labs  Lab 08/16/20 1051 08/19/20 0143 08/20/20 1739 08/21/20 0415 08/22/20 0217  WBC  --  14.6*  --   --  13.3*  HGB 13.3 11.9* 12.9* 12.9* 11.3*  HCT 39.0 38.6* 38.0* 38.0* 34.7*  MCV  --  84.8  --   --  82.0  PLT  --  311  --   --  358   Cardiac Enzymes: No results for input(s): CKTOTAL, CKMB, CKMBINDEX, TROPONINI in the last 168 hours. BNP (last 3 results) Recent Labs    06/27/2020 1922  BNP 135.0*    ProBNP (last 3 results) No results for input(s): PROBNP in the last 8760 hours.  CBG: Recent Labs  Lab 08/21/20 1535 08/21/20 1933 08/21/20 2323 08/22/20 0329 08/22/20 0731  GLUCAP 195* 196* 167* 155* 127*       Studies: No results found.  Scheduled Meds:  amiodarone  200 mg Per Tube Daily   apixaban  5 mg Per Tube BID   buPROPion  75 mg Per Tube BID   chlorhexidine gluconate (MEDLINE KIT)  15 mL Mouth Rinse BID   Chlorhexidine Gluconate Cloth  6 each Topical Q0600   Chlorhexidine Gluconate Cloth  6 each Topical Q0600   docusate  100 mg Per Tube BID   guaiFENesin  5 mL Per Tube BID   insulin aspart  0-15 Units Subcutaneous Q4H   insulin glargine-yfgn  40 Units Subcutaneous Daily   mouth rinse  15 mL Mouth Rinse 10 times per day   multivitamin  1 tablet Per Tube QHS   nutrition supplement (JUVEN)  1 packet Per Tube BID BM   pantoprazole sodium  40 mg Per Tube QHS   polyethylene glycol  17 g Per Tube Daily   sertraline  100 mg Per Tube Daily   sodium chloride flush  10-40 mL Intracatheter Q12H   Continuous Infusions:  sodium chloride Stopped (07/26/20 0824)   sodium chloride 250 mL (07/31/20 1428)   sodium chloride     sodium chloride     feeding supplement (NEPRO CARB STEADY) 1,000 mL (08/21/20 0922)    Active Problems:   Septic shock (HCC)   Pressure  injury of skin   Acute respiratory failure with hypoxia (HCC)   AKI (acute kidney injury) ([place])   Hypotension   Goals of care, counseling/discussion   Quadriplegia ([place])   Chronic kidney disease requiring chronic dialysis [facility])   Hyperglycemia   Consultants: PCCM Nephrology Infectious disease Palliative care medicine  Procedures: Echocardiogram Insertion of double-lumen HD catheter  Antibiotics: Cefepime x1 dose Meropenem 6/29 through 7/2 Vancomycin 6/29 through 6/30 Unasyn 7/2 through 7/15   Time spent: 35 minutes    Erin Hearing ANP  [facility] 7  am - 330 pm/M-F for direct patient care and secure chat Please refer to Amion for contact info 42  days

## 2020-08-23 DIAGNOSIS — J9601 Acute respiratory failure with hypoxia: Secondary | ICD-10-CM | POA: Diagnosis not present

## 2020-08-23 DIAGNOSIS — Z9911 Dependence on respirator [ventilator] status: Secondary | ICD-10-CM | POA: Diagnosis not present

## 2020-08-23 LAB — RENAL FUNCTION PANEL
Albumin: 2.3 g/dL — ABNORMAL LOW (ref 3.5–5.0)
Anion gap: 16 — ABNORMAL HIGH (ref 5–15)
BUN: 69 mg/dL — ABNORMAL HIGH (ref 8–23)
CO2: 22 mmol/L (ref 22–32)
Calcium: 8.9 mg/dL (ref 8.9–10.3)
Chloride: 95 mmol/L — ABNORMAL LOW (ref 98–111)
Creatinine, Ser: 1.09 mg/dL (ref 0.61–1.24)
GFR, Estimated: >60 mL/min (ref >60)
Glucose, Bld: 254 mg/dL — ABNORMAL HIGH (ref 70–99)
Phosphorus: 3.2 mg/dL (ref 2.5–4.6)
Potassium: 3.5 mmol/L (ref 3.5–5.1)
Sodium: 133 mmol/L — ABNORMAL LOW (ref 135–145)

## 2020-08-23 LAB — POCT I-STAT 7, (LYTES, BLD GAS, ICA,H+H)
Acid-Base Excess: 0 mmol/L (ref 0.0–2.0)
Bicarbonate: 24 mmol/L (ref 20.0–28.0)
Calcium, Ion: 1.16 mmol/L (ref 1.15–1.40)
HCT: 39 % (ref 39.0–52.0)
Hemoglobin: 13.3 g/dL (ref 13.0–17.0)
O2 Saturation: 98 %
Patient temperature: 98.1
Potassium: 4.8 mmol/L (ref 3.5–5.1)
Sodium: 133 mmol/L — ABNORMAL LOW (ref 135–145)
TCO2: 25 mmol/L (ref 22–32)
pCO2 arterial: 33.9 mmHg (ref 32.0–48.0)
pH, Arterial: 7.456 — ABNORMAL HIGH (ref 7.350–7.450)
pO2, Arterial: 94 mmHg (ref 83.0–108.0)

## 2020-08-23 LAB — GLUCOSE, CAPILLARY
Glucose-Capillary: 113 mg/dL — ABNORMAL HIGH (ref 70–99)
Glucose-Capillary: 121 mg/dL — ABNORMAL HIGH (ref 70–99)
Glucose-Capillary: 149 mg/dL — ABNORMAL HIGH (ref 70–99)
Glucose-Capillary: 153 mg/dL — ABNORMAL HIGH (ref 70–99)
Glucose-Capillary: 186 mg/dL — ABNORMAL HIGH (ref 70–99)
Glucose-Capillary: 246 mg/dL — ABNORMAL HIGH (ref 70–99)

## 2020-08-23 LAB — MRSA NEXT GEN BY PCR, NASAL: MRSA by PCR Next Gen: NOT DETECTED

## 2020-08-23 MED ORDER — MIDODRINE HCL 5 MG PO TABS
5.0000 mg | ORAL_TABLET | Freq: Three times a day (TID) | ORAL | Status: DC
  Administered 2020-08-23 (×2): 5 mg via ORAL
  Filled 2020-08-23 (×3): qty 1

## 2020-08-23 MED ORDER — POTASSIUM CHLORIDE 20 MEQ PO PACK
40.0000 meq | PACK | Freq: Once | ORAL | Status: AC
  Administered 2020-08-23: 40 meq
  Filled 2020-08-23: qty 2

## 2020-08-23 MED ORDER — MIDODRINE HCL 5 MG PO TABS
5.0000 mg | ORAL_TABLET | Freq: Three times a day (TID) | ORAL | Status: DC
  Administered 2020-08-23: 5 mg

## 2020-08-23 MED ORDER — CHLORHEXIDINE GLUCONATE CLOTH 2 % EX PADS
6.0000 | MEDICATED_PAD | Freq: Every day | CUTANEOUS | Status: DC
  Administered 2020-08-24 – 2020-09-06 (×10): 6 via TOPICAL

## 2020-08-23 NOTE — Progress Notes (Signed)
PROGRESS NOTE    Luis Orr  BTD:[id number] DOB: 11-15-1950 DOA: 07/09/2020 PCP: Townsend Roger, MD    Chief Complaint  Patient presents with   Altered Mental Status    Brief Narrative:  70 y.o. 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 Patient presented secondary to altered mental status and hypotension with recurrent pneumonia requiring vasopressor support and ICU admission.he has had multiple recent admissions for sepsis secondary to various infections including UTIs, pneumonias and bacteremia in setting of MDR Klebsiella, MRSA and pseudomonas. Most recent admission September 2021 in setting of sepsis due to MDR Klebsiella with right ureteral calculus s/p double J ureteral stent placement and treated with meropenem x 10days. He was discharged back to [facility].  Assessment & Plan:   Active Problems:   Septic shock (HCC)   Pressure injury of skin   Acute respiratory failure with hypoxia (HCC)   AKI (acute kidney injury) (HCC)   Hypotension   Goals of care, counseling/discussion   Quadriplegia ([place])   Chronic kidney disease requiring chronic dialysis ([place])   Hyperglycemia   Sepsis due to Acinetobacter baumannii ([place])   Pneumonia due to Acinetobacter species ([place])   Acute on chronic respiratory failure with hypoxia and hypercapnia:  Chronic sec to quadriplegia, vent dependent.  Probably sec to pneumonia and volume overload.     VAP Present on admission.  Sputum culture sig for acinetobacter baumannii.  Completed the course of unasyn for 2 weeks.  ID on board.     AKI with oliguria/ volume overload and new CKD req HD.  Nephrology on board.  TDC placed on 7/26-family wants aggressive measures so anticipate will need permanent access -Nephrology recommendations: Continuing HD   Septic shock sec to VAP;  Resolved.  Briefly on vasopressor support, now weaned off.     H/o PAF:  SINUS rhythm  now,    Acute metabolic encephalopathy:  Resolved.  CT head neg .  Currently back to baseline.    Type 2 DM Insulin dependent,  CBG (last 3)  Recent Labs    08/22/20 2341 08/23/20 0329 08/23/20 0814  GLUCAP 148* 246* 186*   Uncontrolled with hyperglycemia.  Resume lantus and SSI.   Quadriplegia sec to compressive myelopathy:  Will need SNF with vent support for discharge.     Anemia of acute illness Microcytic anemia:  S/p 4 units of prbc transfusion. IV iron given.     Severe protein calorie malnutrition:  On tube feeding.   Pressure injury present on admission.  Pressure Injury 06/23/2020 Sacrum Posterior;Medial Stage 4 - Full thickness tissue loss with exposed bone, tendon or muscle. (Active)  06/21/2020 2240  Location: Sacrum  Location Orientation: Posterior;Medial  Staging: Stage 4 - Full thickness tissue loss with exposed bone, tendon or muscle.  Wound Description (Comments):   Present on Admission: Yes     Pressure Injury 07/23/20 Ear Left Deep Tissue Pressure Injury - Purple or maroon localized area of discolored intact skin or blood-filled blister due to damage of underlying soft tissue from pressure and/or shear. DTI left ear (Active)  07/23/20 2000  Location: Ear  Location Orientation: Left  Staging: Deep Tissue Pressure Injury - Purple or maroon localized area of discolored intact skin or blood-filled blister due to damage of underlying soft tissue from pressure and/or shear.  Wound Description (Comments): DTI left ear  Present on Admission: No         DVT prophylaxis: Heparin Code Status: Partial  code.  Family Communication: none at bedside.  Disposition:   Status is: Inpatient  Remains inpatient appropriate because:Unsafe d/c plan  Dispo: The patient is from:  [facility]              Anticipated d/c is to: SNF              Patient currently is medically stable to d/c.   Difficult to place patient Yes  ICU significant events: 6/29  admitted to ICU for septic shock on levo to maintain MAP>65, continued on vent support 6/29 Blood Cx>> staph species in 1 of 4 cultures drawn>> suspect contaminant 6/30 Off pressors 7/2 ID Consult for MDR acinetobacter in trach asp, Meropenem changed to unasyn 7/2 PRBC transfusion, iron replacement 7/5 Pressors added again. Central line placed. Drainage from gastrostomy tube >> CT Abd without fluid collection around gastrostomy tube but with diffuse body wall edema. Echo EF 50-55%, mild LVH 7/6 Renal Replacement Therapy ; Code status changed to partial Code (DNR) 7/7 Transfuse 1U PRBC 7/9 add solu cortef 7/10 off pressors, weaning stress steroids 7/11 Pressors added back on again 7/12 ID consult due to rising WBC, hypothermia, hypotension concern for worsening infection. Vancomycin added. Repeat blood cx collected. 7/15 CRRT stopped 7/17 Restarted on low dose levo. No evidence of new infection. Held off on antibiotics. Nephrology contacted family and decision was made to continue iHD this week to allow additional time for renal recovery. Patient is not the best long term iHD candidate but family wants aggressive care 7/18 plans for iHD, tolerated with 2L removed 719 slight drop in hgb to 6.9 will transfuse 1 unit PRBC 7/22 > 7/26: Tolerating HD. TOC consulted for placement. Will likely need out of state placement.       Consultants:  PCCM Nephrology Infectious disease Palliative care medicine    Procedures:  Echocardiogram Insertion of double-lumen HD catheter  Antimicrobials:  Cefepime x1 dose Meropenem 6/29 through 7/2 Vancomycin 6/29 through 6/30 Unasyn 7/2 through 7/15    Subjective: Appears comfortable.   Objective: Vitals:   08/23/20 0700 08/23/20 0730 08/23/20 0819 08/23/20 0905  BP: 110/72 (!) 88/58  99/65  Pulse: 100 89  88  Resp: (!) 30 (!) 30  20  Temp:   98.7 F (37.1 C)   TempSrc:   Oral   SpO2: 99% 98%  100%  Weight:      Height:         Intake/Output Summary (Last 24 hours) at 08/23/2020 0907 Last data filed at 08/23/2020 0600 Gross per 24 hour  Intake 900 ml  Output 358 ml  Net 542 ml   Filed Weights   08/21/20 0441 08/22/20 0359 08/23/20 0500  Weight: 74.6 kg 75.4 kg 75.7 kg    Examination:  General exam: chronically ill appearing man, in bed.  Respiratory system: Air entry fair, on vent support, s/p trach in place.  Cardiovascular system: S1 & S2 heard,RRR, no JVD,  Gastrointestinal system: Abdomen is soft, PEG in place, ostomyin place.  Central nervous system: Alert, opening eyes to verbal cues.  Extremities: no edema.  Skin: pressure in jury present.  Psychiatry: no agitation.     Data Reviewed: I have personally reviewed following labs and imaging studies  CBC: Recent Labs  Lab 08/16/20 1051 08/19/20 0143 08/20/20 1739 08/21/20 0415 08/22/20 0217  WBC  --  14.6*  --   --  13.3*  HGB 13.3 11.9* 12.9* 12.9* 11.3*  HCT 39.0 38.6* 38.0* 38.0* 34.7*  MCV  --  84.8  --   --  82.0  PLT  --  311  --   --  161    Basic Metabolic Panel: Recent Labs  Lab 08/18/20 0755 08/20/20 0145 08/20/20 1739 08/21/20 0311 08/21/20 0415 08/22/20 0217 08/23/20 0303  NA 133* 133* 137 132* 136 131* 133*  K 3.4* 4.9 3.0* 4.4 4.1 4.9 3.5  CL 94* 94*  --  94*  --  91* 95*  CO2 26 19*  --  19*  --  20* 22  GLUCOSE 201* 186*  --  173*  --  152* 254*  BUN 57* 187*  --  99*  --  150* 69*  CREATININE 0.88 1.77*  --  1.21  --  1.68* 1.09  CALCIUM 9.5 9.6  --  9.1  --  9.3 8.9  PHOS 2.8 6.1*  --  4.6  --  5.4* 3.2    GFR: Estimated Creatinine Clearance: 56.9 mL/min (by C-G formula based on SCr of 1.09 mg/dL).  Liver Function Tests: Recent Labs  Lab 08/18/20 0755 08/20/20 0145 08/21/20 0311 08/22/20 0217 08/23/20 0303  AST  --   --   --  46*  --   ALT  --   --   --  32  --   ALKPHOS  --   --   --  248*  --   BILITOT  --   --   --  1.1  --   PROT  --   --   --  8.1  --   ALBUMIN 3.2* 2.8* 2.8* 2.5*   2.6* 2.3*    CBG: Recent Labs  Lab 08/22/20 1525 08/22/20 1930 08/22/20 2341 08/23/20 0329 08/23/20 0814  GLUCAP 139* 130* 148* 246* 186*     No results found for this or any previous visit (from the past 240 hour(s)).       Radiology Studies: No results found.      Scheduled Meds:  amiodarone  200 mg Per Tube Daily   apixaban  5 mg Per Tube BID   buPROPion  75 mg Per Tube BID   chlorhexidine gluconate (MEDLINE KIT)  15 mL Mouth Rinse BID   Chlorhexidine Gluconate Cloth  6 each Topical Q0600   Chlorhexidine Gluconate Cloth  6 each Topical Q0600   docusate  100 mg Per Tube BID   guaiFENesin  5 mL Per Tube BID   insulin aspart  0-15 Units Subcutaneous Q4H   insulin glargine-yfgn  40 Units Subcutaneous Daily   mouth rinse  15 mL Mouth Rinse 10 times per day   midodrine  5 mg Oral TID WC   multivitamin  1 tablet Per Tube QHS   pantoprazole sodium  40 mg Per Tube QHS   polyethylene glycol  17 g Per Tube Daily   sertraline  100 mg Per Tube Daily   sodium chloride flush  10-40 mL Intracatheter Q12H   Continuous Infusions:  sodium chloride Stopped (07/26/20 0824)   sodium chloride 250 mL (07/31/20 1428)   sodium chloride     sodium chloride     feeding supplement (NEPRO CARB STEADY) 1,000 mL (08/22/20 1016)     LOS: 57 days       Hosie Poisson, MD [facility]   To contact the attending provider between 7A-7P or the covering provider during after hours 7P-7A, please log into the web site [email or web address] and access using universal [place] password for that web site. If you  do not have the password, please call the hospital operator.  08/23/2020, 9:07 AM

## 2020-08-23 NOTE — Progress Notes (Signed)
NAME:  JOMETH MANAOIS, MRN:  [id number], DOB:  09-10-1950, LOS: 68 ADMISSION DATE:  06/18/2020, CONSULTATION DATE:  07/12/2020 REFERRING MD:  Dr Francia Greaves, EDP CHIEF COMPLAINT:  septic shock    History of Present Illness:  70 yo male from [facility] with hx of C3-C5 compressive myelopathy with functional quadriplegia and chronic trach/vent presented to ED with altered mental status and hypotension with recurrent HCAP.  Has multiple recent admissions for sepsis secondary to various infections including UTIs, pneumonias and bacteremia in setting of MDR Klebsiella, MRSA and pseudomonas. Most recent admission September 2021 in setting of sepsis due to MDR Klebsiella with right ureteral calculus s/p double J ureteral stent placement and treated with meropenem x 10days.  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 Central line placed. Drainage from gastrostomy tube >> CT Abd without fluid collection around gastrostomy tube but with diffuse body wall edema. Echo EF 50-55%, mild LVH  7/6 Renal Replacement Therapy ; Code status changed to partial Code (DNR) 7/7 Transfuse 1U PRBC 7/9 add solu cortef 7/10 off pressors, weaning stress steroids  7/11 Pressors added back on again 7/12 ID consult due to rising WBC, hypothermia, hypotension concern for worsening infection. Vancomycin added. Repeat blood cx collected. 7/15 CRRT stopped   7/17 Restarted on low dose levo. No evidence of new infection. Held off on antibiotics. Nephrology contacted family and decision was made to continue iHD this week to allow additional time for renal recovery. Patient is not a long term iHD candidate  7/18 plans for iHD, tolerated with 2L removed  7/19 slight drop in hgb to 6.9 will transfuse 1 unit PRBC  7/20 To [facility], PCCM following for trach / vent needs 7/22>> tolerating HD, will need out of state placement 8/4 last PCCM visit. 8/4 -8/8: No issues. CM workin gon out of state placement   Interim History / Subjective:  8/8-8/11: tmax 100.6 on 8/9, otherwise afebrile, WBC continues to trend down Remains on 30%, no change in sputum Small airleak on trach 8/11  iHD on 8/8 and 8/10 (8/10 unable to pull fluid given hypotension, ended up positive, off midodrine since 8/2 and one dose 8/6, however midodrine restarted 8/11 per nephrology)  Objective   Blood pressure (!) 88/58, pulse 89, temperature 98.7 F (37.1 C), temperature source Oral, resp. rate (!) 30, height '5\' 6"'$  (1.676 m), weight 75.7 kg, SpO2 98 %.    Vent Mode: PRVC FiO2 (%):  [30 %] 30 % Set Rate:  [30 bmp] 30 bmp Vt Set:  [380 mL] 380 mL PEEP:  [5 cmH20] 5 cmH20 Plateau Pressure:  [21 cmH20-27 cmH20] 21 cmH20   Intake/Output Summary (Last 24 hours) at 08/23/2020 0829 Last data filed at 08/23/2020 0600 Gross per 24 hour  Intake 945 ml  Output 358 ml  Net 587 ml   Filed Weights   08/21/20 0441 08/22/20 0359 08/23/20 0500  Weight: 74.6 kg 75.4 kg 75.7 kg  Physical Exam: General:  chronically ill elderly male in NAD HEENT: MM pink/moist, pupils 3/reactive, midline trach with some white bubbles around site c/w cuff leak Neuro: eyes open, attempts to f/c (stick tongue out and track with eyes), quad CV: NSR PULM:  synchronous/ MV breaths, coarse throughout, no secretions, bubbling around trach site with ~40m airleak on vent, resolved with appropriate cuff pressures GI:  soft, bs+, peg/ ostomy, no foley Extremities: warm/dry, no LE edema, cachectic   Resolved Hospital Problem list   Hyperkalemia Tachycardia Hypotension Thrombocytosis  AKI Septic shock 2nd to HCAP and UTI all present on admission -Acinetobacter in sputum culture from 07/12/20. Completed 14 days of Unasyn for -Of pressors on 4/14. Restarted  levo 7/17.  Assessment & Plan:   Acute on chronic hypoxic and hypercarbic respiratory failure secondary pna and volume overload Tracheostomy and ventilator dependent status. ESRD Hyperglycemia DNR status  Discussion Trach and vent dependent in setting of C3-C5 compressive myelopathy No acute issues.  Stable on vent Plan Ongoing trach care VAP/ PPI No weaning.  Continue full MV support.  Will reduce rr to 20 and increase TV to 8cc for more comfortable breathing.  Recheck ABG at 1100. All other per primary team     BKennieth Rad ACNP [facility] 08/23/2020, 8:29 AM  See Amion for pager If no response to pager, please call PCCM consult pager After 7:00 pm call Elink

## 2020-08-23 NOTE — Progress Notes (Signed)
Patient ID: Luis Orr, male   DOB: 05/10/1950, 70 y.o.   MRN: [id number] [facility] Progress Note   Assessment/ Plan:   1. Acute kidney Injury-prolonged dialysis dependency: With baseline creatinine of around 0.4 and suffered dense ATN from septic shock-without any evidence of renal recovery to date.  Started CRRT on 7/6 for volume overload/anasarca and uremia.  After improving hemodynamic state, transitioned to intermittent hemodialysis on a MWF schedule with family wishing for continued aggressive care.  He will likely need long-term dialysis  - For now HD per MWF schedule - next HD on 8/12 - BUN elevated out of proportion to Cr.  He may have very little muscle mass.  Note BUN charted as 187 on 8/8 and see he has historically fluctuated to near this range before.  I stopped prosource and juven. continued nepro.  Work-up other sources of elevated BUN (I.e. GI bleed) per primary team  - Start scheduled midodrine to optimize hypotension - would not replete potassium per the ICU protocol - discontinued an order for same  2.  Septic shock/acute metabolic encephalopathy: Off pressors and antibiotics at this time.  Start midodrine 5 mg TID to optimize hypotension  3.  Chronic respiratory failure: Status post tracheostomy with ventilator management per CCM  4.  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: Previously resident at [facility] but per charting unfortunately cannot be readmitted there now that he is on what appears to be long-term dialysis  5.  Chronic kidney disease/metabolic bone disease: Calcium and phosphorus level have up to now been under decent control with hemodialysis, not on binders currently. improved with HD.  follow  6. Anemic normocytic - mild; no indication for PRBCs.  Hold aranesp for now - prev on 100 mcg every wed per charting.  Dispo Per charting previously resident at [facility] but per charting unfortunately cannot be readmitted  there now that he is on what appears to be long-term dialysis  Subjective:    Got potassium last night.  Last HD on 8/10 with net positive 400 mL/no UF.  Patient not able to offer additional hx.  Per nursing trouble with bubbles from trach -despite suction and trach care.  She also reports that hypotension with HD overnight.   Review of systems: on chronic vent. Not able to obtain    Objective:   BP (!) 88/58   Pulse 89   Temp 98 F (36.7 C) (Oral)   Resp (!) 30   Ht 5' 6"  (1.676 m)   Wt 75.7 kg   SpO2 98%   BMI 26.94 kg/m   Intake/Output Summary (Last 24 hours) at 08/23/2020 0815 Last data filed at 08/23/2020 0600 Gross per 24 hour  Intake 945 ml  Output 358 ml  Net 587 ml   Weight change: 0.3 kg  Physical Exam:   General adult male in bed chronically ill appearing   HEENT normocephalic atraumatic Neck trachea midline; on vent via trach; FiO2 30 and PEEP 5 Lungs clear anteriorly on auscultation Heart S1S2 no rub Abdomen soft nontender nondistended; ostomy in place; PEG in place Extremities no to trace edema  Neuro - tracks visually; functionally quadriplegic Access: RIJ tunneled catheter in place  Imaging: No results found.  Labs: BMET Recent Labs  Lab 08/18/20 0755 08/20/20 0145 08/20/20 1739 08/21/20 0311 08/21/20 0415 08/22/20 0217 08/23/20 0303  NA 133* 133* 137 132* 136 131* 133*  K 3.4* 4.9 3.0* 4.4 4.1 4.9 3.5  CL 94* 94*  --  94*  --  91* 95*  CO2 26 19*  --  19*  --  20* 22  GLUCOSE 201* 186*  --  173*  --  152* 254*  BUN 57* 187*  --  99*  --  150* 69*  CREATININE 0.88 1.77*  --  1.21  --  1.68* 1.09  CALCIUM 9.5 9.6  --  9.1  --  9.3 8.9  PHOS 2.8 6.1*  --  4.6  --  5.4* 3.2   CBC Recent Labs  Lab 08/19/20 0143 08/20/20 1739 08/21/20 0415 08/22/20 0217  WBC 14.6*  --   --  13.3*  HGB 11.9* 12.9* 12.9* 11.3*  HCT 38.6* 38.0* 38.0* 34.7*  MCV 84.8  --   --  82.0  PLT 311  --   --  358    Medications:     amiodarone  200 mg Per  Tube Daily   apixaban  5 mg Per Tube BID   buPROPion  75 mg Per Tube BID   chlorhexidine gluconate (MEDLINE KIT)  15 mL Mouth Rinse BID   Chlorhexidine Gluconate Cloth  6 each Topical Q0600   Chlorhexidine Gluconate Cloth  6 each Topical Q0600   docusate  100 mg Per Tube BID   guaiFENesin  5 mL Per Tube BID   insulin aspart  0-15 Units Subcutaneous Q4H   insulin glargine-yfgn  40 Units Subcutaneous Daily   mouth rinse  15 mL Mouth Rinse 10 times per day   multivitamin  1 tablet Per Tube QHS   pantoprazole sodium  40 mg Per Tube QHS   polyethylene glycol  17 g Per Tube Daily   sertraline  100 mg Per Tube Daily   sodium chloride flush  10-40 mL Intracatheter Q12H   Claudia Desanctis, MD 08/23/2020, 8:30 AM

## 2020-08-23 NOTE — Progress Notes (Signed)
Nutrition Follow-up  DOCUMENTATION CODES:   Not applicable  INTERVENTION:   TF via PEG: Nepro at 45 ml/h (1080 ml per day).  Provides 1944 kcal, 87 gm protein, 785 ml free water daily.  Continue Renal MVI daily.  Recommend resume Prosource TF 45 ml via tube TID to increase protein intake to 120 gm (1.6 gm/kg) to support healing of stage IV sacral pressure injury.   NUTRITION DIAGNOSIS:   Increased nutrient needs related to wound healing as evidenced by estimated needs.  Ongoing   GOAL:   Patient will meet greater than or equal to 90% of their needs  Protein needs unmet with discontinuation of Prosource TF.  MONITOR:   Vent status, Labs, Weight trends, TF tolerance, Skin, I & O's  REASON FOR ASSESSMENT:   Ventilator, Consult Enteral/tube feeding initiation and management  ASSESSMENT:   70 year old male who presented to the ED on 6/29 from [facility] with AMS and hypotension. 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 Pt admitted with septic shock, acute on chronic hypoxemic respiratory failure.  Discussed patient in ICU rounds and with RN today.  Last HD 8/10, next HD planned for 8/12.  Awaiting placement in a facility that accepts HD and trach/vent patients.  Receiving Nepro at 45 ml/h via PEG to provide 1944 kcal, 87 gm protein, 785 ml free water daily. Prosource TF was discontinued 8/8 and Juven discontinued 8/10 by Nephrology d/t elevated BUN. Current protein provision is 1.2 gm/kg, which is suboptimal to support wound healing. BUN has continued to fluctuate up and down even with d/c of Juven and Prosource TF. Recommend resume Prosource TF and Juven to help meet nutrition needs to support healing of stage IV pressure injury.   Patient remains intubated on ventilator support MV: 9.7 L/min Temp  (24hrs), Avg:98.3 F (36.8 C), Min:98 F (36.7 C), Max:98.7 F (37.1 C)   Admission weight 90.2 kg Current weight 75.7 kg Lowest weight since admission 72 kg on 8/3  I/O -420 ml since admission Colostomy output 775 ml x 24 hours  Labs reviewed. Na 133,  BUN 127(8/1)-->40(8/2)-->110(8/3)-->57(8/6)-->187(8/8-Prosource TF d/c)-->99(8/9)-->150 (8/10)-->69(8/11) CBG: [phone]  Medications reviewed and include Aranesp, Colace, novolog, Semglee, Rena-vit, Juven, Protonix, Miralax.    Diet Order:   Diet Order             Diet NPO time specified  Diet effective midnight                   EDUCATION NEEDS:   No education needs have been identified at this time  Skin:  Skin Assessment: Skin Integrity Issues: Skin Integrity Issues:: DTI, Stage IV DTI: L ear Stage IV: sacrum  Last BM:  8/7 Colostomy  Height:   Ht Readings from Last 1 Encounters:  07/31/20 '5\' 6"'$  (1.676 m)    Weight:   Wt Readings from Last 1 Encounters:  08/23/20 75.7 kg    Ideal Body Weight:  58 kg (adjusted for quadriplegia)  BMI:  Body mass index is 26.94 kg/m.  Estimated Nutritional Needs:   Kcal:  1900-2100  Protein:  110-120 gm  Fluid:  1 L + UOP    Lucas Mallow, RD, LDN, CNSC Please refer to Amion for contact information.

## 2020-08-23 NOTE — Progress Notes (Signed)
CSW received return call from [facility] [place] who states she will place the patient on the waiting list for a potential return to the facility.  Madilyn Fireman, MSW, LCSW Transitions of Care  Clinical Social Worker II [phone]

## 2020-08-24 DIAGNOSIS — Z9911 Dependence on respirator [ventilator] status: Secondary | ICD-10-CM | POA: Diagnosis not present

## 2020-08-24 DIAGNOSIS — J9601 Acute respiratory failure with hypoxia: Secondary | ICD-10-CM | POA: Diagnosis not present

## 2020-08-24 LAB — RENAL FUNCTION PANEL
Albumin: 2.2 g/dL — ABNORMAL LOW (ref 3.5–5.0)
Anion gap: 17 — ABNORMAL HIGH (ref 5–15)
BUN: 102 mg/dL — ABNORMAL HIGH (ref 8–23)
CO2: 21 mmol/L — ABNORMAL LOW (ref 22–32)
Calcium: 9.2 mg/dL (ref 8.9–10.3)
Chloride: 92 mmol/L — ABNORMAL LOW (ref 98–111)
Creatinine, Ser: 1.53 mg/dL — ABNORMAL HIGH (ref 0.61–1.24)
GFR, Estimated: 49 mL/min — ABNORMAL LOW (ref >60)
Glucose, Bld: 138 mg/dL — ABNORMAL HIGH (ref 70–99)
Phosphorus: 3.9 mg/dL (ref 2.5–4.6)
Potassium: 4.9 mmol/L (ref 3.5–5.1)
Sodium: 130 mmol/L — ABNORMAL LOW (ref 135–145)

## 2020-08-24 LAB — GLUCOSE, CAPILLARY
Glucose-Capillary: 100 mg/dL — ABNORMAL HIGH (ref 70–99)
Glucose-Capillary: 113 mg/dL — ABNORMAL HIGH (ref 70–99)
Glucose-Capillary: 121 mg/dL — ABNORMAL HIGH (ref 70–99)
Glucose-Capillary: 143 mg/dL — ABNORMAL HIGH (ref 70–99)
Glucose-Capillary: 144 mg/dL — ABNORMAL HIGH (ref 70–99)
Glucose-Capillary: 84 mg/dL (ref 70–99)

## 2020-08-24 MED ORDER — MIDODRINE HCL 5 MG PO TABS
10.0000 mg | ORAL_TABLET | Freq: Three times a day (TID) | ORAL | Status: DC
  Administered 2020-08-24 – 2020-08-26 (×7): 10 mg
  Filled 2020-08-24 (×8): qty 2

## 2020-08-24 MED ORDER — PANCRELIPASE (LIP-PROT-AMYL) 12000-38000 UNITS PO CPEP
12000.0000 [IU] | ORAL_CAPSULE | Freq: Once | ORAL | Status: AC
  Administered 2020-08-24: 12000 [IU] via ORAL
  Filled 2020-08-24: qty 1

## 2020-08-24 MED ORDER — SODIUM BICARBONATE 650 MG PO TABS
650.0000 mg | ORAL_TABLET | Freq: Once | ORAL | Status: AC
  Administered 2020-08-24: 650 mg
  Filled 2020-08-24: qty 1

## 2020-08-24 NOTE — Progress Notes (Signed)
PROGRESS NOTE    Luis Orr  BTD:[id number] DOB: 1950-12-21 DOA: 07/09/2020 PCP: Townsend Roger, MD    Chief Complaint  Patient presents with   Altered Mental Status    Brief Narrative:  70 y.o. 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 Patient presented secondary to altered mental status and hypotension with recurrent pneumonia requiring vasopressor support and ICU admission.he has had multiple recent admissions for sepsis secondary to various infections including UTIs, pneumonias and bacteremia in setting of MDR Klebsiella, MRSA and pseudomonas. Most recent admission September 2021 in setting of sepsis due to MDR Klebsiella with right ureteral calculus s/p double J ureteral stent placement and treated with meropenem x 10days. He was discharged back to [facility]. He was admitted with AKI sec to ATN from septic shock without evidence of renal recovery. He was started HD.  Assessment & Plan:   Active Problems:   Septic shock (HCC)   Pressure injury of skin   Acute respiratory failure with hypoxia (HCC)   AKI (acute kidney injury) (HCC)   Hypotension   Goals of care, counseling/discussion   Quadriplegia ([place])   Chronic kidney disease requiring chronic dialysis ([place])   Hyperglycemia   Sepsis due to Acinetobacter baumannii ([place])   Pneumonia due to Acinetobacter species ([place])   Acute on chronic respiratory failure with hypoxia and hypercapnia:  Chronic sec to quadriplegia, vent dependent.  Probably sec to pneumonia and volume overload.     VAP Present on admission.  Sputum culture sig for acinetobacter baumannii.  Completed the course of unasyn for 2 weeks.  ID on board.  No new events.     AKI with oliguria/ volume overload and new CKD req HD.  Nephrology on board.  TDC placed on 7/26-family wants aggressive measures so anticipate will need permanent access -Nephrology recommendations: Continuing  HD   Septic shock sec to VAP;  Resolved.  Briefly on vasopressor support, now weaned off.     H/o PAF:  SINUS rhythm now,    Acute metabolic encephalopathy:  Resolved.  CT head neg .  Currently back to baseline.    Type 2 DM Insulin dependent,  CBG (last 3)  Recent Labs    08/24/20 0339 08/24/20 0718 08/24/20 1133  GLUCAP 144* 143* 84    Uncontrolled with hyperglycemia.  Resume lantus and SSI.   Quadriplegia sec to compressive myelopathy:  Will need SNF with vent support for discharge.     Anemia of acute illness Microcytic anemia:  S/p 4 units of prbc transfusion. IV iron given.  TRANSFUSE TO KEEP HEMOGLOBIN GREATER THAN 7.     Severe protein calorie malnutrition:  On tube feeding.    Hypotension:  In the setting of septic shock .  Improved with midodrine 10 mg TID.   Pressure injury present on admission.  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 (Active)  07/07/2020 2240  Location: Sacrum  Location Orientation: Posterior;Medial  Staging: Stage 4 - Full thickness tissue loss with exposed bone, tendon or muscle.  Wound Description (Comments):   Present on Admission: Yes     Pressure Injury 07/23/20 Ear Left Deep Tissue Pressure Injury - Purple or maroon localized area of discolored intact skin or blood-filled blister due to damage of underlying soft tissue from pressure and/or shear. DTI left ear (Active)  07/23/20 2000  Location: Ear  Location Orientation: Left  Staging: Deep Tissue  Pressure Injury - Purple or maroon localized area of discolored intact skin or blood-filled blister due to damage of underlying soft tissue from pressure and/or shear.  Wound Description (Comments): DTI left ear  Present on Admission: No         DVT prophylaxis: Heparin Code Status: Partial code.  Family Communication: none at bedside.  Disposition:   Status is:  Inpatient  Remains inpatient appropriate because:Unsafe d/c plan  Dispo: The patient is from:  [place]              Anticipated d/c is to: SNF              Patient currently is medically stable to d/c.   Difficult to place patient Yes  ICU significant events: 6/29 admitted to ICU for septic shock on levo to maintain MAP>65, continued on vent support 6/29 Blood Cx>> staph species in 1 of 4 cultures drawn>> suspect contaminant 6/30 Off pressors 7/2 ID Consult for MDR acinetobacter in trach asp, Meropenem changed to unasyn 7/2 PRBC transfusion, iron replacement 7/5 Pressors added again. Central line placed. Drainage from gastrostomy tube >> CT Abd without fluid collection around gastrostomy tube but with diffuse body wall edema. Echo EF 50-55%, mild LVH 7/6 Renal Replacement Therapy ; Code status changed to partial Code (DNR) 7/7 Transfuse 1U PRBC 7/9 add solu cortef 7/10 off pressors, weaning stress steroids 7/11 Pressors added back on again 7/12 ID consult due to rising WBC, hypothermia, hypotension concern for worsening infection. Vancomycin added. Repeat blood cx collected. 7/15 CRRT stopped 7/17 Restarted on low dose levo. No evidence of new infection. Held off on antibiotics. Nephrology contacted family and decision was made to continue iHD this week to allow additional time for renal recovery. Patient is not the best long term iHD candidate but family wants aggressive care 7/18 plans for iHD, tolerated with 2L removed 719 slight drop in hgb to 6.9 will transfuse 1 unit PRBC 7/22 > 7/26: Tolerating HD. TOC consulted for placement. Will likely need out of state placement.       Consultants:  PCCM Nephrology Infectious disease Palliative care medicine    Procedures:  Echocardiogram Insertion of double-lumen HD catheter  Antimicrobials:  Cefepime x1 dose Meropenem 6/29 through 7/2 Vancomycin 6/29 through 6/30 Unasyn 7/2 through 7/15    Subjective: PEG tube  blocked ,   Objective: Vitals:   08/24/20 0800 08/24/20 0900 08/24/20 1126 08/24/20 1134  BP: 96/62 98/69 117/70   Pulse: 89 85 89   Resp: _0 Temp:    99.5 F (37.5 C)  TempSrc:    Oral  SpO2: 97% 98% 100%   Weight:      Height:        Intake/Output Summary (Last 24 hours) at 08/24/2020 1158 Last data filed at 08/24/2020 0800 Gross per 24 hour  Intake 1230 ml  Output 250 ml  Net 980 ml    Filed Weights   08/22/20 0359 08/23/20 0500 08/24/20 0312  Weight: 75.4 kg 75.7 kg 75.2 kg    Examination:  General exam: chronically ill appearing gentleman, on vent support.  Respiratory system: air entry fair, no wheezing heard, s/p trach on vent support.  Cardiovascular system: RRR,no JVD, S!S2 heard.  Gastrointestinal system: Abdomen is soft, non distended, PEG in place and ostomy + Central nervous system: Alert, not following commands.  Extremities: no pedal edema.  Skin: pressure in jury present.  Psychiatry: No agitation.     Data Reviewed:  I have personally reviewed following labs and imaging studies  CBC: Recent Labs  Lab 08/19/20 0143 08/20/20 1739 08/21/20 0415 08/22/20 0217 08/23/20 1157  WBC 14.6*  --   --  13.3*  --   HGB 11.9* 12.9* 12.9* 11.3* 13.3  HCT 38.6* 38.0* 38.0* 34.7* 39.0  MCV 84.8  --   --  82.0  --   PLT 311  --   --  358  --      Basic Metabolic Panel: Recent Labs  Lab 08/20/20 0145 08/20/20 1739 08/21/20 0311 08/21/20 0415 08/22/20 0217 08/23/20 0303 08/23/20 1157 08/24/20 0446  NA 133*   < > 132* 136 131* 133* 133* 130*  K 4.9   < > 4.4 4.1 4.9 3.5 4.8 4.9  CL 94*  --  94*  --  91* 95*  --  92*  CO2 19*  --  19*  --  20* 22  --  21*  GLUCOSE 186*  --  173*  --  152* 254*  --  138*  BUN 187*  --  99*  --  150* 69*  --  102*  CREATININE 1.77*  --  1.21  --  1.68* 1.09  --  1.53*  CALCIUM 9.6  --  9.1  --  9.3 8.9  --  9.2  PHOS 6.1*  --  4.6  --  5.4* 3.2  --  3.9   < > = values in this interval not displayed.      GFR: Estimated Creatinine Clearance: 40.5 mL/min (A) (by C-G formula based on SCr of 1.53 mg/dL (H)).  Liver Function Tests: Recent Labs  Lab 08/20/20 0145 08/21/20 0311 08/22/20 0217 08/23/20 0303 08/24/20 0446  AST  --   --  46*  --   --   ALT  --   --  32  --   --   ALKPHOS  --   --  248*  --   --   BILITOT  --   --  1.1  --   --   PROT  --   --  8.1  --   --   ALBUMIN 2.8* 2.8* 2.5*  2.6* 2.3* 2.2*     CBG: Recent Labs  Lab 08/23/20 1941 08/23/20 2332 08/24/20 0339 08/24/20 0718 08/24/20 1133  GLUCAP 121* 113* 144* 143* 84      Recent Results (from the past 240 hour(s))  MRSA Next Gen by PCR, Nasal     Status: None   Collection Time: 08/23/20  4:17 PM   Specimen: Nasal Mucosa; Nasal Swab  Result Value Ref Range Status   MRSA by PCR Next Gen NOT DETECTED NOT DETECTED Final    Comment: (NOTE) The GeneXpert MRSA Assay (FDA approved for NASAL specimens only), is one component of a comprehensive MRSA colonization surveillance program. It is not intended to diagnose MRSA infection nor to guide or monitor treatment for MRSA infections. Test performance is not FDA approved in patients less than 47 years old. Performed at [facility], [place] [address] [place], [place] [address]          Radiology Studies: No results found.      Scheduled Meds:  amiodarone  200 mg Per Tube Daily   apixaban  5 mg Per Tube BID   buPROPion  75 mg Per Tube BID   chlorhexidine gluconate (MEDLINE KIT)  15 mL Mouth Rinse BID   Chlorhexidine Gluconate Cloth  6 each Topical Q0600  Chlorhexidine Gluconate Cloth  6 each Topical Q0600   Chlorhexidine Gluconate Cloth  6 each Topical Q0600   docusate  100 mg Per Tube BID   guaiFENesin  5 mL Per Tube BID   insulin aspart  0-15 Units Subcutaneous Q4H   insulin glargine-yfgn  40 Units Subcutaneous Daily   lipase/protease/amylase  12,000 Units Oral Once   And   sodium bicarbonate  650 mg Per Tube Once   mouth rinse   15 mL Mouth Rinse 10 times per day   midodrine  10 mg Per Tube TID WC   multivitamin  1 tablet Per Tube QHS   pantoprazole sodium  40 mg Per Tube QHS   polyethylene glycol  17 g Per Tube Daily   sertraline  100 mg Per Tube Daily   sodium chloride flush  10-40 mL Intracatheter Q12H   Continuous Infusions:  sodium chloride Stopped (07/26/20 0824)   sodium chloride 250 mL (07/31/20 1428)   sodium chloride     sodium chloride     feeding supplement (NEPRO CARB STEADY) 1,000 mL (08/23/20 1405)     LOS: 44 days       Hosie Poisson, MD [facility]   To contact the attending provider between 7A-7P or the covering provider during after hours 7P-7A, please log into the web site [email or web address] and access using universal [place] password for that web site. If you do not have the password, please call the hospital operator.  08/24/2020, 11:58 AM

## 2020-08-24 NOTE — Progress Notes (Signed)
Palliative Medicine RN Note: Discussed pt in team rounds.  All palliative needs have been addressed, and GOC are clear. TOC is working on disposition.   At this time, PMT will sign off. If a new need arises or if the family/pt ask to meet again, please reconsult [place] and call our office.  Marjie Skiff Lainee Lehrman, RN, BSN, [facility] Palliative Medicine Team 08/24/2020 8:46 AM Office [phone]

## 2020-08-24 NOTE — Progress Notes (Signed)
Pt hemodialysis treatment move to tomorrow per Dr. Royce Macadamia ordered. Dormon Naa RN notified.

## 2020-08-24 NOTE — Progress Notes (Signed)
RT NOTE: no SBT on patient this AM due to patient being a chronic vent/trach.  Patient is currently tolerating current ventilator settings well.  Will continue to monitor.

## 2020-08-24 NOTE — Progress Notes (Signed)
Patient ID: Luis Orr, male   DOB: 1951/01/12, 70 y.o.   MRN: [id number] [facility] Progress Note   Assessment/ Plan:   1. Acute kidney Injury-prolonged dialysis dependency: With baseline creatinine of around 0.4 and suffered dense ATN from septic shock-without any evidence of renal recovery to date.  Started CRRT on 7/6 for volume overload/anasarca and uremia.  After improving hemodynamic state, transitioned to intermittent hemodialysis on a MWF schedule with family wishing for continued aggressive care.  He will likely need long-term dialysis  - For now HD per MWF schedule - next HD today - BUN elevated out of proportion to Cr.  He may have very little muscle mass.  Note BUN charted as 187 on 8/8 and see he has historically fluctuated to near this range before.  I stopped prosource and juven. continued nepro.   - Work-up other sources of elevated BUN (I.e. GI bleed) per primary team  - Started scheduled midodrine to optimize hypotension  2.  Hypotension.  Hx Septic shock/acute metabolic encephalopathy: Off pressors and antibiotics at this time.  Increase midodrine to 10 mg TID to optimize hypotension  3.  Chronic respiratory failure: Status post tracheostomy with ventilator management per CCM  4.  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  Chronic kidney disease/metabolic bone disease: Calcium and phosphorus under decent control with hemodialysis, not on binders   6. Anemic normocytic - mild; no indication for PRBCs.  Hold aranesp for now - prev on 100 mcg every wed per charting.  Dispo Per charting previously resident at [facility] but per charting unfortunately cannot be readmitted there now that he is on what appears to be long-term dialysis  Subjective:    Last HD on 8/10 with net positive 400  mL/no UF.  Patient not able to offer additional hx.  no urine output is charted on 8/11. Per RN he has not had any urine output.  Blood pressures were low last night.   Review of systems: on chronic vent. Not able to obtain    Objective:   BP (!) 88/59   Pulse 87   Temp 98.3 F (36.8 C) (Oral)   Resp 20   Ht _0  (1.676 m)   Wt 75.2 kg   SpO2 98%   BMI 26.76 kg/m   Intake/Output Summary (Last 24 hours) at 08/24/2020 0725 Last data filed at 08/24/2020 0600 Gross per 24 hour  Intake 1255 ml  Output 250 ml  Net 1005 ml   Weight change: -0.5 kg  Physical Exam:   General adult male in bed chronically ill appearing   HEENT normocephalic atraumatic Neck trachea midline; on vent via trach; FiO2 30 and PEEP 5 Lungs clear anteriorly on auscultation Heart S1S2 no rub Abdomen soft nontender nondistended; ostomy in place; PEG in place Extremities no to trace edema thighs and arms none lower legs Neuro - does not wake with exam this am; had been more awake per RN who is at bedside; functionally quadriplegic Access: RIJ tunneled catheter in place  Imaging: No results found.  Labs: BMET Recent Labs  Lab 08/18/20 0755 08/20/20 0145 08/20/20 1739 08/21/20 0311 08/21/20 0415 08/22/20 0217 08/23/20 0303 08/23/20 1157 08/24/20 0446  NA 133* 133* 137 132* 136 131* 133* 133* 130*  K 3.4* 4.9 3.0* 4.4 4.1 4.9 3.5 4.8 4.9  CL 94*  94*  --  94*  --  91* 95*  --  92*  CO2 26 19*  --  19*  --  20* 22  --  21*  GLUCOSE 201* 186*  --  173*  --  152* 254*  --  138*  BUN 57* 187*  --  99*  --  150* 69*  --  102*  CREATININE 0.88 1.77*  --  1.21  --  1.68* 1.09  --  1.53*  CALCIUM 9.5 9.6  --  9.1  --  9.3 8.9  --  9.2  PHOS 2.8 6.1*  --  4.6  --  5.4* 3.2  --  3.9   CBC Recent Labs  Lab 08/19/20 0143 08/20/20 1739 08/21/20 0415 08/22/20 0217 08/23/20 1157  WBC 14.6*  --   --  13.3*  --   HGB 11.9* 12.9* 12.9* 11.3* 13.3  HCT 38.6* 38.0* 38.0* 34.7* 39.0  MCV 84.8  --   --  82.0   --   PLT 311  --   --  358  --     Medications:     amiodarone  200 mg Per Tube Daily   apixaban  5 mg Per Tube BID   buPROPion  75 mg Per Tube BID   chlorhexidine gluconate (MEDLINE KIT)  15 mL Mouth Rinse BID   Chlorhexidine Gluconate Cloth  6 each Topical Q0600   Chlorhexidine Gluconate Cloth  6 each Topical Q0600   Chlorhexidine Gluconate Cloth  6 each Topical Q0600   docusate  100 mg Per Tube BID   guaiFENesin  5 mL Per Tube BID   insulin aspart  0-15 Units Subcutaneous Q4H   insulin glargine-yfgn  40 Units Subcutaneous Daily   mouth rinse  15 mL Mouth Rinse 10 times per day   midodrine  5 mg Per Tube TID WC   multivitamin  1 tablet Per Tube QHS   pantoprazole sodium  40 mg Per Tube QHS   polyethylene glycol  17 g Per Tube Daily   sertraline  100 mg Per Tube Daily   sodium chloride flush  10-40 mL Intracatheter Q12H   Claudia Desanctis, MD 08/24/2020,7:37 AM

## 2020-08-25 DIAGNOSIS — J9601 Acute respiratory failure with hypoxia: Secondary | ICD-10-CM | POA: Diagnosis not present

## 2020-08-25 DIAGNOSIS — Z9911 Dependence on respirator [ventilator] status: Secondary | ICD-10-CM | POA: Diagnosis not present

## 2020-08-25 LAB — RENAL FUNCTION PANEL
Albumin: 2 g/dL — ABNORMAL LOW (ref 3.5–5.0)
Anion gap: 17 — ABNORMAL HIGH (ref 5–15)
BUN: 135 mg/dL — ABNORMAL HIGH (ref 8–23)
CO2: 21 mmol/L — ABNORMAL LOW (ref 22–32)
Calcium: 9.2 mg/dL (ref 8.9–10.3)
Chloride: 92 mmol/L — ABNORMAL LOW (ref 98–111)
Creatinine, Ser: 2.02 mg/dL — ABNORMAL HIGH (ref 0.61–1.24)
GFR, Estimated: 35 mL/min — ABNORMAL LOW (ref >60)
Glucose, Bld: 169 mg/dL — ABNORMAL HIGH (ref 70–99)
Phosphorus: 4.9 mg/dL — ABNORMAL HIGH (ref 2.5–4.6)
Potassium: 5.3 mmol/L — ABNORMAL HIGH (ref 3.5–5.1)
Sodium: 130 mmol/L — ABNORMAL LOW (ref 135–145)

## 2020-08-25 LAB — GLUCOSE, CAPILLARY
Glucose-Capillary: 113 mg/dL — ABNORMAL HIGH (ref 70–99)
Glucose-Capillary: 120 mg/dL — ABNORMAL HIGH (ref 70–99)
Glucose-Capillary: 130 mg/dL — ABNORMAL HIGH (ref 70–99)
Glucose-Capillary: 147 mg/dL — ABNORMAL HIGH (ref 70–99)
Glucose-Capillary: 175 mg/dL — ABNORMAL HIGH (ref 70–99)
Glucose-Capillary: 184 mg/dL — ABNORMAL HIGH (ref 70–99)

## 2020-08-25 MED ORDER — SODIUM ZIRCONIUM CYCLOSILICATE 5 G PO PACK
5.0000 g | PACK | Freq: Once | ORAL | Status: AC
  Administered 2020-08-25: 5 g
  Filled 2020-08-25: qty 1

## 2020-08-25 MED ORDER — SODIUM ZIRCONIUM CYCLOSILICATE 5 G PO PACK
5.0000 g | PACK | Freq: Once | ORAL | Status: DC
  Filled 2020-08-25: qty 1

## 2020-08-25 NOTE — Progress Notes (Signed)
Patient ID: Luis Orr, male   DOB: November 19, 1950, 70 y.o.   MRN: [id number] [facility] Progress Note   Assessment/ Plan:   1. Acute kidney Injury-prolonged dialysis dependency: With baseline creatinine of around 0.4 and suffered dense ATN from septic shock-without any evidence of renal recovery to date.  Started CRRT on 7/6 for volume overload/anasarca and uremia.  After improving hemodynamic state, transitioned to intermittent hemodialysis on a MWF schedule with family wishing for continued aggressive care.  He will likely need long-term dialysis  - HD today 8/13 - tx was moved due to staffing - BUN elevated out of proportion to Cr.  He may just have very little muscle mass.  Note BUN charted as 187 on 8/8 and see he has historically fluctuated to near this range before.  I stopped prosource and juven to see if impact; on nepro.   - Work-up other sources of elevated BUN (I.e. GI bleed) per primary team  - Started scheduled midodrine to optimize hypotension  2.  Hypotension.  Hx Septic shock/acute metabolic encephalopathy: Off pressors and antibiotics at this time.  Increased midodrine to 10 mg TID to optimize hypotension  3.  Chronic respiratory failure: Status post tracheostomy with ventilator management per CCM  4.  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  Chronic kidney disease/metabolic bone disease: Calcium and phosphorus under decent control with hemodialysis alone, not on binders   6. Anemic normocytic - mild; no indication for PRBCs.  Hold aranesp for now - note he was previously on 100 mcg every wed per charting. CBC in am  Dispo Per charting previously resident at [facility] but per charting unfortunately cannot be readmitted there now that he is on what appears to be long-term  dialysis  Subjective:    Last HD on 8/10 with net positive 400 mL/no UF.  Pt moved to today due to HD unit staffing.  Patient not able to offer additional hx.  no urine output is charted on 8/12 but he did have small urine output overnight in bed per nursing.    Review of systems: unable to obtain on vent    Objective:   BP 109/68   Pulse 93   Temp 98.9 F (37.2 C) (Oral)   Resp 20   Ht 5' 6"  (1.676 m)   Wt 74.8 kg   SpO2 99%   BMI 26.62 kg/m   Intake/Output Summary (Last 24 hours) at 08/25/2020 0759 Last data filed at 08/25/2020 0700 Gross per 24 hour  Intake 1305 ml  Output 500 ml  Net 805 ml   Weight change: -0.4 kg  Physical Exam:   General adult male in bed chronically ill appearing   HEENT normocephalic atraumatic Neck trachea midline; on vent via trach; FiO2 30 and PEEP 5 Lungs clear anteriorly on auscultation Heart S1S2 no rub Abdomen soft nontender distended; ostomy in place; PEG in place Extremities no to trace edema thighs, abd, and arms none lower legs Neuro - does wake with exam this am; functionally quadriplegic Access: RIJ tunneled catheter in place  Imaging: No results found.  Labs: BMET Recent Labs  Lab 08/20/20 0145 08/20/20 1739 08/21/20 0311 08/21/20 0415 08/22/20 0217 08/23/20 0303 08/23/20 1157 08/24/20 0446  NA 133* 137 132* 136 131* 133* 133* 130*  K 4.9 3.0* 4.4 4.1 4.9 3.5 4.8 4.9  CL 94*  --  94*  --  91* 95*  --  92*  CO2 19*  --  19*  --  20* 22  --  21*  GLUCOSE 186*  --  173*  --  152* 254*  --  138*  BUN 187*  --  99*  --  150* 69*  --  102*  CREATININE 1.77*  --  1.21  --  1.68* 1.09  --  1.53*  CALCIUM 9.6  --  9.1  --  9.3 8.9  --  9.2  PHOS 6.1*  --  4.6  --  5.4* 3.2  --  3.9   CBC Recent Labs  Lab 08/19/20 0143 08/20/20 1739 08/21/20 0415 08/22/20 0217 08/23/20 1157  WBC 14.6*  --   --  13.3*  --   HGB 11.9* 12.9* 12.9* 11.3* 13.3  HCT 38.6* 38.0* 38.0* 34.7* 39.0  MCV 84.8  --   --  82.0  --   PLT 311   --   --  358  --     Medications:     amiodarone  200 mg Per Tube Daily   apixaban  5 mg Per Tube BID   buPROPion  75 mg Per Tube BID   chlorhexidine gluconate (MEDLINE KIT)  15 mL Mouth Rinse BID   Chlorhexidine Gluconate Cloth  6 each Topical Q0600   Chlorhexidine Gluconate Cloth  6 each Topical Q0600   Chlorhexidine Gluconate Cloth  6 each Topical Q0600   docusate  100 mg Per Tube BID   guaiFENesin  5 mL Per Tube BID   insulin aspart  0-15 Units Subcutaneous Q4H   insulin glargine-yfgn  40 Units Subcutaneous Daily   mouth rinse  15 mL Mouth Rinse 10 times per day   midodrine  10 mg Per Tube TID WC   multivitamin  1 tablet Per Tube QHS   pantoprazole sodium  40 mg Per Tube QHS   polyethylene glycol  17 g Per Tube Daily   sertraline  100 mg Per Tube Daily   sodium chloride flush  10-40 mL Intracatheter Q12H   Claudia Desanctis, MD 08/25/2020,8:09 AM

## 2020-08-25 NOTE — Progress Notes (Signed)
PROGRESS NOTE    Luis Orr  JKD:[id number] DOB: [birth year] DOA: 06/18/2020 PCP: Townsend Roger, MD    Chief Complaint  Patient presents with   Altered Mental Status    Brief Narrative:  70 y.o. 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 Patient presented secondary to altered mental status and hypotension with recurrent pneumonia requiring vasopressor support and ICU admission.he has had multiple recent admissions for sepsis secondary to various infections including UTIs, pneumonias and bacteremia in setting of MDR Klebsiella, MRSA and pseudomonas. Most recent admission September 2021 in setting of sepsis due to MDR Klebsiella with right ureteral calculus s/p double J ureteral stent placement and treated with meropenem x 10days. He was discharged back to [facility]. He was admitted with AKI sec to ATN from septic shock without evidence of renal recovery. He was started on HD. Pt on [facility] but cannot be readmitted there , due to long term HD.   Assessment & Plan:   Active Problems:   Septic shock (HCC)   Pressure injury of skin   Acute respiratory failure with hypoxia (HCC)   AKI (acute kidney injury) (HCC)   Hypotension   Goals of care, counseling/discussion   Quadriplegia ([place])   Chronic kidney disease requiring chronic dialysis ([place])   Hyperglycemia   Sepsis due to Acinetobacter baumannii ([place])   Pneumonia due to Acinetobacter species ([place])   Acute on chronic respiratory failure with hypoxia and hypercapnia:  Chronic sec to quadriplegia, vent dependent.  Probably sec to pneumonia and volume overload.  Resolved.  Pt had worsening secretions.     VAP Present on admission.  Sputum culture sig for acinetobacter baumannii.  Completed the course of unasyn for 2 weeks.  ID on board.  Afebrile and  recheck labs in am.     AKI with oliguria/ volume overload and new CKD req HD.  Nephrology on  board.  TDC placed on 7/26-family wants aggressive measures so anticipate will need permanent access -Nephrology recommendations: Continuing HD   Septic shock sec to VAP;  Resolved.  Briefly on vasopressor support, now weaned off.  BP parameters are optimal today.    H/o PAF:  Sinus tachycardia today.    Acute metabolic encephalopathy:  Resolved.  CT head neg .  Currently back to baseline.    Type 2 DM Insulin dependent,  CBG (last 3)  Recent Labs    08/25/20 0332 08/25/20 0753 08/25/20 1105  GLUCAP 147* 175* 184*    Uncontrolled with hyperglycemia.  Resume lantus and SSI.   Quadriplegia sec to compressive myelopathy:  Will need SNF with vent support for discharge.     Anemia of acute illness Microcytic anemia:  S/p 4 units of prbc transfusion. IV iron given.  TRANSFUSE TO KEEP HEMOGLOBIN GREATER THAN 7.     Severe protein calorie malnutrition:  On tube feeding.    Hypotension:  In the setting of septic shock .  Improved with midodrine 10 mg TID.   Pressure injury present on admission.  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 (Active)  06/18/2020 2240  Location: Sacrum  Location Orientation: Posterior;Medial  Staging: Stage 4 - Full thickness tissue loss with exposed bone, tendon or muscle.  Wound Description (Comments):   Present on Admission: Yes     Pressure Injury 07/23/20 Ear Left Deep Tissue Pressure Injury - Purple or maroon localized area of discolored intact  skin or blood-filled blister due to damage of underlying soft tissue from pressure and/or shear. DTI left ear (Active)  07/23/20 2000  Location: Ear  Location Orientation: Left  Staging: Deep Tissue Pressure Injury - Purple or maroon localized area of discolored intact skin or blood-filled blister due to damage of underlying soft tissue from pressure and/or shear.  Wound Description (Comments): DTI left  ear  Present on Admission: No     Hyperkalemia:  HD today.     DVT prophylaxis: Heparin Code Status: Partial code.  Family Communication: none at bedside.  Disposition:   Status is: Inpatient  Remains inpatient appropriate because:Unsafe d/c plan  Dispo: The patient is from:  [place]              Anticipated d/c is to: SNF              Patient currently is medically stable to d/c.   Difficult to place patient Yes  ICU significant events: 6/29 admitted to ICU for septic shock on levo to maintain MAP>65, continued on vent support 6/29 Blood Cx>> staph species in 1 of 4 cultures drawn>> suspect contaminant 6/30 Off pressors 7/2 ID Consult for MDR acinetobacter in trach asp, Meropenem changed to unasyn 7/2 PRBC transfusion, iron replacement 7/5 Pressors added again. Central line placed. Drainage from gastrostomy tube >> CT Abd without fluid collection around gastrostomy tube but with diffuse body wall edema. Echo EF 50-55%, mild LVH 7/6 Renal Replacement Therapy ; Code status changed to partial Code (DNR) 7/7 Transfuse 1U PRBC 7/9 add solu cortef 7/10 off pressors, weaning stress steroids 7/11 Pressors added back on again 7/12 ID consult due to rising WBC, hypothermia, hypotension concern for worsening infection. Vancomycin added. Repeat blood cx collected. 7/15 CRRT stopped 7/17 Restarted on low dose levo. No evidence of new infection. Held off on antibiotics. Nephrology contacted family and decision was made to continue iHD this week to allow additional time for renal recovery. Patient is not the best long term iHD candidate but family wants aggressive care 7/18 plans for iHD, tolerated with 2L removed 719 slight drop in hgb to 6.9 will transfuse 1 unit PRBC 7/22 > 7/26: Tolerating HD. TOC consulted for placement. Will likely need out of state placement.       Consultants:  PCCM Nephrology Infectious disease Palliative care medicine    Procedures:   Echocardiogram Insertion of double-lumen HD catheter  Antimicrobials:  Cefepime x1 dose Meropenem 6/29 through 7/2 Vancomycin 6/29 through 6/30 Unasyn 7/2 through 7/15    Subjective: No new events.   Objective: Vitals:   08/25/20 0755 08/25/20 1106 08/25/20 1151 08/25/20 1527  BP:      Pulse:      Resp:      Temp: 98.9 F (37.2 C) 98.7 F (37.1 C)    TempSrc: Oral Oral    SpO2:   94% 94%  Weight:      Height:        Intake/Output Summary (Last 24 hours) at 08/25/2020 1543 Last data filed at 08/25/2020 1400 Gross per 24 hour  Intake 1340 ml  Output 500 ml  Net 840 ml    Filed Weights   08/23/20 0500 08/24/20 0312 08/25/20 0355  Weight: 75.7 kg 75.2 kg 74.8 kg    Examination:  General exam: Ill appearing, chronically, on vent support.  Respiratory system: diminished air entry fair, no wheezing , on Vent support.  Cardiovascular system: S1S2 sinus tachycardic, no JVD.  Gastrointestinal system: Abdomen  is soft, non distended, PEG in place and ostomy + Central nervous system: Alert,not following commands.  Extremities: no pedal edema.  Skin: pressure in jury present.  Psychiatry: No agitation.     Data Reviewed: I have personally reviewed following labs and imaging studies  CBC: Recent Labs  Lab 08/19/20 0143 08/20/20 1739 08/21/20 0415 08/22/20 0217 08/23/20 1157  WBC 14.6*  --   --  13.3*  --   HGB 11.9* 12.9* 12.9* 11.3* 13.3  HCT 38.6* 38.0* 38.0* 34.7* 39.0  MCV 84.8  --   --  82.0  --   PLT 311  --   --  358  --      Basic Metabolic Panel: Recent Labs  Lab 08/21/20 0311 08/21/20 0415 08/22/20 0217 08/23/20 0303 08/23/20 1157 08/24/20 0446 08/25/20 1348  NA 132*   < > 131* 133* 133* 130* 130*  K 4.4   < > 4.9 3.5 4.8 4.9 5.3*  CL 94*  --  91* 95*  --  92* 92*  CO2 19*  --  20* 22  --  21* 21*  GLUCOSE 173*  --  152* 254*  --  138* 169*  BUN 99*  --  150* 69*  --  102* 135*  CREATININE 1.21  --  1.68* 1.09  --  1.53* 2.02*   CALCIUM 9.1  --  9.3 8.9  --  9.2 9.2  PHOS 4.6  --  5.4* 3.2  --  3.9 4.9*   < > = values in this interval not displayed.     GFR: Estimated Creatinine Clearance: 30.7 mL/min (A) (by C-G formula based on SCr of 2.02 mg/dL (H)).  Liver Function Tests: Recent Labs  Lab 08/21/20 0311 08/22/20 0217 08/23/20 0303 08/24/20 0446 08/25/20 1348  AST  --  46*  --   --   --   ALT  --  32  --   --   --   ALKPHOS  --  248*  --   --   --   BILITOT  --  1.1  --   --   --   PROT  --  8.1  --   --   --   ALBUMIN 2.8* 2.5*  2.6* 2.3* 2.2* 2.0*     CBG: Recent Labs  Lab 08/24/20 1938 08/24/20 2333 08/25/20 0332 08/25/20 0753 08/25/20 1105  GLUCAP 121* 100* 147* 175* 184*      Recent Results (from the past 240 hour(s))  MRSA Next Gen by PCR, Nasal     Status: None   Collection Time: 08/23/20  4:17 PM   Specimen: Nasal Mucosa; Nasal Swab  Result Value Ref Range Status   MRSA by PCR Next Gen NOT DETECTED NOT DETECTED Final    Comment: (NOTE) The GeneXpert MRSA Assay (FDA approved for NASAL specimens only), is one component of a comprehensive MRSA colonization surveillance program. It is not intended to diagnose MRSA infection nor to guide or monitor treatment for MRSA infections. Test performance is not FDA approved in patients less than 90 years old. Performed at [facility], [place] [address], [place], [place] [address]          Radiology Studies: No results found.      Scheduled Meds:  amiodarone  200 mg Per Tube Daily   apixaban  5 mg Per Tube BID   buPROPion  75 mg Per Tube BID   chlorhexidine gluconate (MEDLINE KIT)  15 mL Mouth Rinse  BID   Chlorhexidine Gluconate Cloth  6 each Topical Q0600   Chlorhexidine Gluconate Cloth  6 each Topical Q0600   Chlorhexidine Gluconate Cloth  6 each Topical Q0600   docusate  100 mg Per Tube BID   guaiFENesin  5 mL Per Tube BID   insulin aspart  0-15 Units Subcutaneous Q4H   insulin glargine-yfgn  40 Units  Subcutaneous Daily   mouth rinse  15 mL Mouth Rinse 10 times per day   midodrine  10 mg Per Tube TID WC   multivitamin  1 tablet Per Tube QHS   pantoprazole sodium  40 mg Per Tube QHS   polyethylene glycol  17 g Per Tube Daily   sertraline  100 mg Per Tube Daily   sodium chloride flush  10-40 mL Intracatheter Q12H   Continuous Infusions:  sodium chloride Stopped (07/26/20 0824)   sodium chloride 250 mL (07/31/20 1428)   sodium chloride     sodium chloride     feeding supplement (NEPRO CARB STEADY) 1,000 mL (08/23/20 1405)     LOS: 74 days       Hosie Poisson, MD [facility]   To contact the attending provider between 7A-7P or the covering provider during after hours 7P-7A, please log into the web site [email or web address] and access using universal [place] password for that web site. If you do not have the password, please call the hospital operator.  08/25/2020, 3:43 PM

## 2020-08-25 NOTE — Progress Notes (Signed)
RT note-patient with desat period, increased fio2 to 50%. Moderate amount secretions thin, frothy, yellow.

## 2020-08-26 ENCOUNTER — Inpatient Hospital Stay (HOSPITAL_COMMUNITY): Payer: 59

## 2020-08-26 DIAGNOSIS — N179 Acute kidney failure, unspecified: Secondary | ICD-10-CM | POA: Diagnosis not present

## 2020-08-26 DIAGNOSIS — J9601 Acute respiratory failure with hypoxia: Secondary | ICD-10-CM | POA: Diagnosis not present

## 2020-08-26 LAB — CBC
HCT: 30.8 % — ABNORMAL LOW (ref 39.0–52.0)
Hemoglobin: 9.8 g/dL — ABNORMAL LOW (ref 13.0–17.0)
MCH: 26.1 pg (ref 26.0–34.0)
MCHC: 31.8 g/dL (ref 30.0–36.0)
MCV: 81.9 fL (ref 80.0–100.0)
Platelets: 517 10*3/uL — ABNORMAL HIGH (ref 150–400)
RBC: 3.76 MIL/uL — ABNORMAL LOW (ref 4.22–5.81)
RDW: 20.2 % — ABNORMAL HIGH (ref 11.5–15.5)
WBC: 28.8 10*3/uL — ABNORMAL HIGH (ref 4.0–10.5)
nRBC: 0.7 % — ABNORMAL HIGH (ref 0.0–0.2)

## 2020-08-26 LAB — RENAL FUNCTION PANEL
Albumin: 1.9 g/dL — ABNORMAL LOW (ref 3.5–5.0)
Anion gap: 16 — ABNORMAL HIGH (ref 5–15)
BUN: 145 mg/dL — ABNORMAL HIGH (ref 8–23)
CO2: 23 mmol/L (ref 22–32)
Calcium: 9.2 mg/dL (ref 8.9–10.3)
Chloride: 90 mmol/L — ABNORMAL LOW (ref 98–111)
Creatinine, Ser: 2.15 mg/dL — ABNORMAL HIGH (ref 0.61–1.24)
GFR, Estimated: 32 mL/min — ABNORMAL LOW (ref >60)
Glucose, Bld: 122 mg/dL — ABNORMAL HIGH (ref 70–99)
Phosphorus: 4.3 mg/dL (ref 2.5–4.6)
Potassium: 4.8 mmol/L (ref 3.5–5.1)
Sodium: 129 mmol/L — ABNORMAL LOW (ref 135–145)

## 2020-08-26 LAB — GLUCOSE, CAPILLARY
Glucose-Capillary: 124 mg/dL — ABNORMAL HIGH (ref 70–99)
Glucose-Capillary: 108 mg/dL — ABNORMAL HIGH (ref 70–99)
Glucose-Capillary: 166 mg/dL — ABNORMAL HIGH (ref 70–99)
Glucose-Capillary: 93 mg/dL (ref 70–99)
Glucose-Capillary: 85 mg/dL (ref 70–99)

## 2020-08-26 LAB — BASIC METABOLIC PANEL
Anion gap: 13 (ref 5–15)
BUN: 46 mg/dL — ABNORMAL HIGH (ref 8–23)
CO2: 24 mmol/L (ref 22–32)
Calcium: 8.9 mg/dL (ref 8.9–10.3)
Chloride: 95 mmol/L — ABNORMAL LOW (ref 98–111)
Creatinine, Ser: 1.06 mg/dL (ref 0.61–1.24)
GFR, Estimated: >60 mL/min (ref >60)
Glucose, Bld: 178 mg/dL — ABNORMAL HIGH (ref 70–99)
Potassium: 3.1 mmol/L — ABNORMAL LOW (ref 3.5–5.1)
Sodium: 132 mmol/L — ABNORMAL LOW (ref 135–145)

## 2020-08-26 LAB — HEPATITIS B SURFACE ANTIGEN: Hepatitis B Surface Ag: NONREACTIVE

## 2020-08-26 MED ORDER — MIDODRINE HCL 5 MG PO TABS
10.0000 mg | ORAL_TABLET | Freq: Three times a day (TID) | ORAL | Status: DC
  Administered 2020-08-26 – 2020-08-28 (×5): 10 mg
  Filled 2020-08-26 (×6): qty 2

## 2020-08-26 MED ORDER — POTASSIUM CHLORIDE 20 MEQ PO PACK
20.0000 meq | PACK | Freq: Once | ORAL | Status: AC
  Administered 2020-08-26: 20 meq via ORAL
  Filled 2020-08-26: qty 1

## 2020-08-26 MED ORDER — DARBEPOETIN ALFA 40 MCG/0.4ML IJ SOSY
40.0000 ug | PREFILLED_SYRINGE | INTRAMUSCULAR | Status: DC
  Administered 2020-08-26: 40 ug via SUBCUTANEOUS
  Filled 2020-08-26: qty 0.4

## 2020-08-26 NOTE — Progress Notes (Signed)
Patient ID: Luis Orr, male   DOB: 02-14-1950, 70 y.o.   MRN: [id number] [facility] Progress Note   Assessment/ Plan:   1. Acute kidney Injury-prolonged dialysis dependency: With baseline creatinine of around 0.4 and suffered dense ATN from septic shock-without any evidence of renal recovery to date.  Started CRRT on 7/6 for volume overload/anasarca and uremia.  After improving hemodynamic state, transitioned to intermittent hemodialysis on a MWF schedule with family wishing for continued aggressive care.  He will likely need long-term dialysis  - Next HD on 8/16 - BUN elevated out of proportion to Cr.  He may just have very little muscle mass.  Note BUN charted as 187 on 8/8 and see he has historically fluctuated to near this range before.  I stopped prosource and juven to see if impact; on nepro.   - repeat BMP - Work-up other sources of elevated BUN (I.e. GI bleed) per primary team  - Started scheduled midodrine to optimize hypotension  2.  Hypotension.  Hx Septic shock/acute metabolic encephalopathy: Off pressors and antibiotics at this time.  Increased midodrine to 10 mg TID to optimize hypotension - adjust to every 8 hour dosing rather than TID defaults  3.  Chronic respiratory failure: Status post tracheostomy with ventilator management per CCM  4.  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  Chronic kidney disease/metabolic bone disease: Calcium and phosphorus under decent control with hemodialysis alone, not on binders   6. Anemic normocytic - mild; no indication for PRBCs.  Aranesp reduced to 40 mcg weekly for now - sundays  7. Leukocytosis - obtain blood cultures - other work-up per primary team   Dispo Per charting previously resident at [facility] but per charting unfortunately cannot be  readmitted there now that he is on what appears to be long-term dialysis  Subjective:    Last HD on 8/12-8/13 early AM with net positive 128 mL/no UF.  Patient not able to offer additional hx.  no urine output is charted on 8/13. Hypotension with HD.  Review of systems: unable to obtain on vent    Objective:   BP (!) 92/59 (BP Location: Left Leg)   Pulse 94   Temp (!) 97.4 F (36.3 C) (Axillary)   Resp 20   Ht _0  (1.676 m)   Wt 74.4 kg   SpO2 100%   BMI 26.47 kg/m   Intake/Output Summary (Last 24 hours) at 08/26/2020 0816 Last data filed at 08/26/2020 0800 Gross per 24 hour  Intake 1160 ml  Output 22 ml  Net 1138 ml   Weight change: -0.4 kg  Physical Exam:   General adult male in bed chronically ill appearing   HEENT normocephalic atraumatic Neck trachea midline; on vent via trach; FiO2 30 and PEEP 5 Lungs clear anteriorly on auscultation Heart S1S2 no rub Abdomen soft nontender distended; ostomy in place; PEG in place Extremities no edema thighs, abd, and arms none lower legs Neuro - does wake with exam this am; functionally quadriplegic Access: RIJ tunneled catheter in place  Imaging: No results found.  Labs: BMET Recent Labs  Lab 08/20/20 0145 08/20/20 1739 08/21/20 0311 08/21/20 0415 08/22/20 0217 08/23/20 0303 08/23/20 1157 08/24/20 0446 08/25/20 1348 08/26/20 0314  NA 133*   < > 132* 136 131* 133* 133* 130* 130* 129*  K 4.9   < >  4.4 4.1 4.9 3.5 4.8 4.9 5.3* 4.8  CL 94*  --  94*  --  91* 95*  --  92* 92* 90*  CO2 19*  --  19*  --  20* 22  --  21* 21* 23  GLUCOSE 186*  --  173*  --  152* 254*  --  138* 169* 122*  BUN 187*  --  99*  --  150* 69*  --  102* 135* 145*  CREATININE 1.77*  --  1.21  --  1.68* 1.09  --  1.53* 2.02* 2.15*  CALCIUM 9.6  --  9.1  --  9.3 8.9  --  9.2 9.2 9.2  PHOS 6.1*  --  4.6  --  5.4* 3.2  --  3.9 4.9* 4.3   < > = values in this interval not displayed.   CBC Recent Labs  Lab 08/21/20 0415 08/22/20 0217  08/23/20 1157 08/26/20 0314  WBC  --  13.3*  --  28.8*  HGB 12.9* 11.3* 13.3 9.8*  HCT 38.0* 34.7* 39.0 30.8*  MCV  --  82.0  --  81.9  PLT  --  358  --  517*    Medications:     amiodarone  200 mg Per Tube Daily   apixaban  5 mg Per Tube BID   buPROPion  75 mg Per Tube BID   chlorhexidine gluconate (MEDLINE KIT)  15 mL Mouth Rinse BID   Chlorhexidine Gluconate Cloth  6 each Topical Q0600   Chlorhexidine Gluconate Cloth  6 each Topical Q0600   Chlorhexidine Gluconate Cloth  6 each Topical Q0600   docusate  100 mg Per Tube BID   guaiFENesin  5 mL Per Tube BID   insulin aspart  0-15 Units Subcutaneous Q4H   insulin glargine-yfgn  40 Units Subcutaneous Daily   mouth rinse  15 mL Mouth Rinse 10 times per day   midodrine  10 mg Per Tube TID WC   multivitamin  1 tablet Per Tube QHS   pantoprazole sodium  40 mg Per Tube QHS   polyethylene glycol  17 g Per Tube Daily   sertraline  100 mg Per Tube Daily   sodium chloride flush  10-40 mL Intracatheter Q12H   Claudia Desanctis, MD 08/26/2020,8:32 AM

## 2020-08-26 NOTE — Progress Notes (Signed)
PROGRESS NOTE    Luis Orr  LZJ:[id number] DOB: August 14, 1950 DOA: 07/01/2020 PCP: Townsend Roger, MD    Chief Complaint  Patient presents with   Altered Mental Status    Brief Narrative:  70 y.o. 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 Patient presented secondary to altered mental status and hypotension with recurrent pneumonia requiring vasopressor support and ICU admission.he has had multiple recent admissions for sepsis secondary to various infections including UTIs, pneumonias and bacteremia in setting of MDR Klebsiella, MRSA and pseudomonas. Most recent admission September 2021 in setting of sepsis due to MDR Klebsiella with right ureteral calculus s/p double J ureteral stent placement and treated with meropenem x 10days. He was discharged back to [facility]. He was admitted with AKI sec to ATN from septic shock without evidence of renal recovery. He was started on HD. Pt on [facility] but cannot be readmitted there , due to long term HD.  No overnight events but his wbc count increased to 28,000. Blood cultures done, and CXR ordered.   Assessment & Plan:   Active Problems:   Septic shock (HCC)   Pressure injury of skin   Acute respiratory failure with hypoxia (HCC)   AKI (acute kidney injury) (HCC)   Hypotension   Goals of care, counseling/discussion   Quadriplegia ([place])   Chronic kidney disease requiring chronic dialysis ([place])   Hyperglycemia   Sepsis due to Acinetobacter baumannii ([place])   Pneumonia due to Acinetobacter species ([place])   Acute on chronic respiratory failure with hypoxia and hypercapnia:  Chronic sec to quadriplegia, vent dependent. Fi02 30%.  Probably sec to pneumonia and volume overload.  Resolved.     Ventricular associated pneumonia:  Present on admission.  Sputum culture sig for acinetobacter baumannii.  Completed the course of unasyn for 2 weeks.  ID has signed off.  Pt  afebrile, but wbc count worsened, will get repeat CXR today and blood cultures already done.     AKI with oliguria/ volume overload and new CKD req HD.  Nephrology on board.  TDC placed on 7/26-family wants aggressive measures so anticipate will need permanent access -Nephrology recommendations: Continuing HD   Septic shock sec to VAP;  Resolved.  Briefly on vasopressor support, now weaned off.  Midodrine for hypotension, .   Hyponatremia:  From new CKD.  Improving with HD.   Acute metabolic encephalopathy:  Resolved.  CT head neg .  Currently back to baseline.    Type 2 DM Insulin dependent,  CBG (last 3)  Recent Labs    08/26/20 0338 08/26/20 0733 08/26/20 1129  GLUCAP 124* 108* 166*    Uncontrolled with hyperglycemia.  Resume lantus and SSI.  Hemoglobin A1c is 6.3% in 07/10/2020  Quadriplegia sec to compressive myelopathy:  Will need SNF with vent support for discharge.     Anemia of acute illness Microcytic anemia:  S/p 4 units of prbc transfusion. IV iron given.  Transfuse to keep hemoglobin greater than 7     Severe protein calorie malnutrition:  On tube feeds at 54m/hr.    Hypotension:  In the setting of septic shock .  Improved with midodrine 10 mg TID.   Pressure injury present on admission.  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 (Active)  06/13/2020 2240  Location: Sacrum  Location Orientation: Posterior;Medial  Staging: Stage 4 - Full thickness tissue loss with exposed bone,  tendon or muscle.  Wound Description (Comments):   Present on Admission: Yes     Pressure Injury 07/23/20 Ear Left Deep Tissue Pressure Injury - Purple or maroon localized area of discolored intact skin or blood-filled blister due to damage of underlying soft tissue from pressure and/or shear. DTI left ear (Active)  07/23/20 2000  Location: Ear  Location Orientation: Left  Staging:  Deep Tissue Pressure Injury - Purple or maroon localized area of discolored intact skin or blood-filled blister due to damage of underlying soft tissue from pressure and/or shear.  Wound Description (Comments): DTI left ear  Present on Admission: No     Hyperkalemia:  Resolved with HD.     DVT prophylaxis: Heparin Code Status: Partial code.  Family Communication: none at bedside.  Disposition:   Status is: Inpatient  Remains inpatient appropriate because:Unsafe d/c plan  Dispo: The patient is from:  [name]              Anticipated d/c is to: SNF              Patient currently is medically stable to d/c.   Difficult to place patient Yes  ICU significant events: 6/29 admitted to ICU for septic shock on levo to maintain MAP>65, continued on vent support 6/29 Blood Cx>> staph species in 1 of 4 cultures drawn>> suspect contaminant 6/30 Off pressors 7/2 ID Consult for MDR acinetobacter in trach asp, Meropenem changed to unasyn 7/2 PRBC transfusion, iron replacement 7/5 Pressors added again. Central line placed. Drainage from gastrostomy tube >> CT Abd without fluid collection around gastrostomy tube but with diffuse body wall edema. Echo EF 50-55%, mild LVH 7/6 Renal Replacement Therapy ; Code status changed to partial Code (DNR) 7/7 Transfuse 1U PRBC 7/9 add solu cortef 7/10 off pressors, weaning stress steroids 7/11 Pressors added back on again 7/12 ID consult due to rising WBC, hypothermia, hypotension concern for worsening infection. Vancomycin added. Repeat blood cx collected. 7/15 CRRT stopped 7/17 Restarted on low dose levo. No evidence of new infection. Held off on antibiotics. Nephrology contacted family and decision was made to continue iHD this week to allow additional time for renal recovery. Patient is not the best long term iHD candidate but family wants aggressive care 7/18 plans for iHD, tolerated with 2L removed 719 slight drop in hgb to 6.9 will transfuse 1  unit PRBC 7/22 > 7/26: Tolerating HD. TOC consulted for placement. Will likely need out of state placement.       Consultants:  PCCM Nephrology Infectious disease Palliative care medicine    Procedures:  Echocardiogram Insertion of double-lumen HD catheter  Antimicrobials:  Cefepime x1 dose Meropenem 6/29 through 7/2 Vancomycin 6/29 through 6/30 Unasyn 7/2 through 7/15    Subjective: No new events overnight.   Objective: Vitals:   08/26/20 1142 08/26/20 1200 08/26/20 1300 08/26/20 1400  BP:  136/82 121/76 (!) 87/58  Pulse:  97 90 86  Resp:  20 20 20   Temp: 98.5 F (36.9 C)     TempSrc: Oral     SpO2:  97% 98% 97%  Weight:      Height:        Intake/Output Summary (Last 24 hours) at 08/26/2020 1510 Last data filed at 08/26/2020 1400 Gross per 24 hour  Intake 1080 ml  Output 22 ml  Net 1058 ml    Filed Weights   08/24/20 0312 08/25/20 0355 08/26/20 0300  Weight: 75.2 kg 74.8 kg 74.4 kg  Examination:  General exam: Ill appearing elderly gentleman on vent support, comfortable.  Respiratory system: diminished air entry at bases, no wheezing heard. On VENT support.  Cardiovascular system: S1S2 RRR, no JVD,  Gastrointestinal system: Abdomen is soft, non distended, PEG in place and ostomy + Central nervous system: Alert , but not following commands.  Extremities: no cyanosis.  Skin: stage 4 sacral decubitus ulcer.  Psychiatry: No agitation.     Data Reviewed: I have personally reviewed following labs and imaging studies  CBC: Recent Labs  Lab 08/20/20 1739 08/21/20 0415 08/22/20 0217 08/23/20 1157 08/26/20 0314  WBC  --   --  13.3*  --  28.8*  HGB 12.9* 12.9* 11.3* 13.3 9.8*  HCT 38.0* 38.0* 34.7* 39.0 30.8*  MCV  --   --  82.0  --  81.9  PLT  --   --  358  --  517*     Basic Metabolic Panel: Recent Labs  Lab 08/22/20 0217 08/23/20 0303 08/23/20 1157 08/24/20 0446 08/25/20 1348 08/26/20 0314 08/26/20 0925  NA 131* 133* 133* 130*  130* 129* 132*  K 4.9 3.5 4.8 4.9 5.3* 4.8 3.1*  CL 91* 95*  --  92* 92* 90* 95*  CO2 20* 22  --  21* 21* 23 24  GLUCOSE 152* 254*  --  138* 169* 122* 178*  BUN 150* 69*  --  102* 135* 145* 46*  CREATININE 1.68* 1.09  --  1.53* 2.02* 2.15* 1.06  CALCIUM 9.3 8.9  --  9.2 9.2 9.2 8.9  PHOS 5.4* 3.2  --  3.9 4.9* 4.3  --      GFR: Estimated Creatinine Clearance: 58.5 mL/min (by C-G formula based on SCr of 1.06 mg/dL).  Liver Function Tests: Recent Labs  Lab 08/22/20 0217 08/23/20 0303 08/24/20 0446 08/25/20 1348 08/26/20 0314  AST 46*  --   --   --   --   ALT 32  --   --   --   --   ALKPHOS 248*  --   --   --   --   BILITOT 1.1  --   --   --   --   PROT 8.1  --   --   --   --   ALBUMIN 2.5*  2.6* 2.3* 2.2* 2.0* 1.9*     CBG: Recent Labs  Lab 08/25/20 1911 08/25/20 2337 08/26/20 0338 08/26/20 0733 08/26/20 1129  GLUCAP 120* 130* 124* 108* 166*      Recent Results (from the past 240 hour(s))  MRSA Next Gen by PCR, Nasal     Status: None   Collection Time: 08/23/20  4:17 PM   Specimen: Nasal Mucosa; Nasal Swab  Result Value Ref Range Status   MRSA by PCR Next Gen NOT DETECTED NOT DETECTED Final    Comment: (NOTE) The GeneXpert MRSA Assay (FDA approved for NASAL specimens only), is one component of a comprehensive MRSA colonization surveillance program. It is not intended to diagnose MRSA infection nor to guide or monitor treatment for MRSA infections. Test performance is not FDA approved in patients less than 39 years old. Performed at [facility], [place] [address] [place], [place] [address]          Radiology Studies: No results found.      Scheduled Meds:  amiodarone  200 mg Per Tube Daily   apixaban  5 mg Per Tube BID   buPROPion  75 mg Per Tube BID  chlorhexidine gluconate (MEDLINE KIT)  15 mL Mouth Rinse BID   Chlorhexidine Gluconate Cloth  6 each Topical Q0600   Chlorhexidine Gluconate Cloth  6 each Topical Q0600   Chlorhexidine  Gluconate Cloth  6 each Topical Q0600   darbepoetin (ARANESP) injection - NON-DIALYSIS  40 mcg Subcutaneous Q Sun-1800   docusate  100 mg Per Tube BID   guaiFENesin  5 mL Per Tube BID   insulin aspart  0-15 Units Subcutaneous Q4H   insulin glargine-yfgn  40 Units Subcutaneous Daily   mouth rinse  15 mL Mouth Rinse 10 times per day   midodrine  10 mg Per Tube Q8H   multivitamin  1 tablet Per Tube QHS   pantoprazole sodium  40 mg Per Tube QHS   polyethylene glycol  17 g Per Tube Daily   sertraline  100 mg Per Tube Daily   sodium chloride flush  10-40 mL Intracatheter Q12H   Continuous Infusions:  sodium chloride Stopped (07/26/20 0824)   sodium chloride 250 mL (07/31/20 1428)   sodium chloride     sodium chloride     feeding supplement (NEPRO CARB STEADY) 45 mL/hr at 08/26/20 1400     LOS: 33 days       Hosie Poisson, MD [facility]   To contact the attending provider between 7A-7P or the covering provider during after hours 7P-7A, please log into the web site [email or web address] and access using universal [place] password for that web site. If you do not have the password, please call the hospital operator.  08/26/2020, 3:10 PM

## 2020-08-26 NOTE — Progress Notes (Addendum)
[facility] Progress Note Patient Name: Luis Orr DOB: January 23, 1950 MRN: [id number]   Date of Service  08/26/2020  HPI/Events of Note  Notified of tube feeds around trache.  Pt is being administered tube feeds via PEG. Pt has an ostomy and has good output per bedside RN.  Qtc 420.   eICU Interventions  Hold tube feeds.       Intervention Category Minor Interventions: Other:  Elsie Lincoln 08/26/2020, 8:15 PM  3:26 AM Pt hypoglycemic with glucose 52 and 59, given D50.   Plan> Start on D5LR. Check CXR. Considering to restart trickle tube feeds.

## 2020-08-27 ENCOUNTER — Inpatient Hospital Stay (HOSPITAL_COMMUNITY): Payer: 59

## 2020-08-27 DIAGNOSIS — N186 End stage renal disease: Secondary | ICD-10-CM | POA: Diagnosis not present

## 2020-08-27 DIAGNOSIS — Z9911 Dependence on respirator [ventilator] status: Secondary | ICD-10-CM | POA: Diagnosis not present

## 2020-08-27 DIAGNOSIS — J9601 Acute respiratory failure with hypoxia: Secondary | ICD-10-CM | POA: Diagnosis not present

## 2020-08-27 DIAGNOSIS — A4159 Other Gram-negative sepsis: Secondary | ICD-10-CM | POA: Diagnosis not present

## 2020-08-27 DIAGNOSIS — G8250 Quadriplegia, unspecified: Secondary | ICD-10-CM | POA: Diagnosis not present

## 2020-08-27 LAB — LACTIC ACID, PLASMA: Lactic Acid, Venous: 1.7 mmol/L (ref 0.5–1.9)

## 2020-08-27 LAB — CBC WITH DIFFERENTIAL/PLATELET
Abs Immature Granulocytes: 1.68 10*3/uL — ABNORMAL HIGH (ref 0.00–0.07)
Basophils Absolute: 0.1 10*3/uL (ref 0.0–0.1)
Basophils Relative: 0 %
Eosinophils Absolute: 0.1 10*3/uL (ref 0.0–0.5)
Eosinophils Relative: 0 %
HCT: 28.2 % — ABNORMAL LOW (ref 39.0–52.0)
Hemoglobin: 9.3 g/dL — ABNORMAL LOW (ref 13.0–17.0)
Immature Granulocytes: 6 %
Lymphocytes Relative: 6 %
Lymphs Abs: 1.6 10*3/uL (ref 0.7–4.0)
MCH: 26.8 pg (ref 26.0–34.0)
MCHC: 33 g/dL (ref 30.0–36.0)
MCV: 81.3 fL (ref 80.0–100.0)
Monocytes Absolute: 2.3 10*3/uL — ABNORMAL HIGH (ref 0.1–1.0)
Monocytes Relative: 8 %
Neutro Abs: 22.9 10*3/uL — ABNORMAL HIGH (ref 1.7–7.7)
Neutrophils Relative %: 80 %
Platelets: 489 10*3/uL — ABNORMAL HIGH (ref 150–400)
RBC: 3.47 MIL/uL — ABNORMAL LOW (ref 4.22–5.81)
RDW: 20.3 % — ABNORMAL HIGH (ref 11.5–15.5)
WBC: 28.7 10*3/uL — ABNORMAL HIGH (ref 4.0–10.5)
nRBC: 0.5 % — ABNORMAL HIGH (ref 0.0–0.2)

## 2020-08-27 LAB — GLUCOSE, CAPILLARY
Glucose-Capillary: 102 mg/dL — ABNORMAL HIGH (ref 70–99)
Glucose-Capillary: 118 mg/dL — ABNORMAL HIGH (ref 70–99)
Glucose-Capillary: 121 mg/dL — ABNORMAL HIGH (ref 70–99)
Glucose-Capillary: 52 mg/dL — ABNORMAL LOW (ref 70–99)
Glucose-Capillary: 59 mg/dL — ABNORMAL LOW (ref 70–99)
Glucose-Capillary: 73 mg/dL (ref 70–99)
Glucose-Capillary: 73 mg/dL (ref 70–99)
Glucose-Capillary: 95 mg/dL (ref 70–99)

## 2020-08-27 LAB — PROCALCITONIN: Procalcitonin: 18.73 ng/mL

## 2020-08-27 LAB — COMPREHENSIVE METABOLIC PANEL
ALT: 37 U/L (ref 0–44)
AST: 53 U/L — ABNORMAL HIGH (ref 15–41)
Albumin: 1.7 g/dL — ABNORMAL LOW (ref 3.5–5.0)
Alkaline Phosphatase: 333 U/L — ABNORMAL HIGH (ref 38–126)
Anion gap: 13 (ref 5–15)
BUN: 70 mg/dL — ABNORMAL HIGH (ref 8–23)
CO2: 25 mmol/L (ref 22–32)
Calcium: 8.9 mg/dL (ref 8.9–10.3)
Chloride: 94 mmol/L — ABNORMAL LOW (ref 98–111)
Creatinine, Ser: 1.42 mg/dL — ABNORMAL HIGH (ref 0.61–1.24)
GFR, Estimated: 53 mL/min — ABNORMAL LOW (ref >60)
Glucose, Bld: 76 mg/dL (ref 70–99)
Potassium: 4.1 mmol/L (ref 3.5–5.1)
Sodium: 132 mmol/L — ABNORMAL LOW (ref 135–145)
Total Bilirubin: 1.4 mg/dL — ABNORMAL HIGH (ref 0.3–1.2)
Total Protein: 6.6 g/dL (ref 6.5–8.1)

## 2020-08-27 LAB — C-REACTIVE PROTEIN: CRP: 28.4 mg/dL — ABNORMAL HIGH (ref <1.0)

## 2020-08-27 LAB — MAGNESIUM: Magnesium: 2.1 mg/dL (ref 1.7–2.4)

## 2020-08-27 LAB — HEPATITIS B SURFACE ANTIBODY, QUANTITATIVE: Hep B S AB Quant (Post): <3.1 m[IU]/mL — ABNORMAL LOW (ref >9.9)

## 2020-08-27 MED ORDER — DEXTROSE 50 % IV SOLN
INTRAVENOUS | Status: AC
  Administered 2020-08-27: 50 mL via INTRAVENOUS
  Filled 2020-08-27: qty 50

## 2020-08-27 MED ORDER — SODIUM CHLORIDE 0.9 % IV SOLN: 3.0000 g | Freq: Three times a day (TID) | INTRAVENOUS | Status: DC

## 2020-08-27 MED ORDER — DEXTROSE IN LACTATED RINGERS 5 % IV SOLN: INTRAVENOUS | Status: DC

## 2020-08-27 MED ORDER — DEXTROSE 50 % IV SOLN: 1.0000 | Freq: Once | INTRAVENOUS | Status: AC

## 2020-08-27 MED ORDER — INSULIN ASPART 100 UNIT/ML IJ SOLN
0.0000 [IU] | INTRAMUSCULAR | Status: DC
  Administered 2020-08-28 (×2): 1 [IU] via SUBCUTANEOUS
  Administered 2020-08-28: 2 [IU] via SUBCUTANEOUS
  Administered 2020-08-28 (×2): 1 [IU] via SUBCUTANEOUS
  Administered 2020-08-28: 2 [IU] via SUBCUTANEOUS
  Administered 2020-08-29: 3 [IU] via SUBCUTANEOUS
  Administered 2020-08-29: 2 [IU] via SUBCUTANEOUS
  Administered 2020-08-29: 1 [IU] via SUBCUTANEOUS
  Administered 2020-08-29: 2 [IU] via SUBCUTANEOUS
  Administered 2020-08-29: 1 [IU] via SUBCUTANEOUS
  Administered 2020-08-29: 3 [IU] via SUBCUTANEOUS
  Administered 2020-08-30 (×5): 2 [IU] via SUBCUTANEOUS
  Administered 2020-08-31: 1 [IU] via SUBCUTANEOUS
  Administered 2020-08-31 (×2): 2 [IU] via SUBCUTANEOUS
  Administered 2020-08-31: 1 [IU] via SUBCUTANEOUS
  Administered 2020-08-31 (×2): 2 [IU] via SUBCUTANEOUS
  Administered 2020-09-01 (×3): 1 [IU] via SUBCUTANEOUS
  Administered 2020-09-02 (×2): 3 [IU] via SUBCUTANEOUS
  Administered 2020-09-02 (×2): 2 [IU] via SUBCUTANEOUS
  Administered 2020-09-02: 1 [IU] via SUBCUTANEOUS
  Administered 2020-09-02 – 2020-09-03 (×2): 2 [IU] via SUBCUTANEOUS
  Administered 2020-09-03: 1 [IU] via SUBCUTANEOUS
  Administered 2020-09-03 (×4): 2 [IU] via SUBCUTANEOUS
  Administered 2020-09-05 (×2): 1 [IU] via SUBCUTANEOUS
  Administered 2020-09-06 (×3): 2 [IU] via SUBCUTANEOUS
  Administered 2020-09-06: 3 [IU] via SUBCUTANEOUS
  Administered 2020-09-06: 5 [IU] via SUBCUTANEOUS
  Administered 2020-09-06: 3 [IU] via SUBCUTANEOUS
  Administered 2020-09-07: 2 [IU] via SUBCUTANEOUS
  Administered 2020-09-07: 1 [IU] via SUBCUTANEOUS
  Administered 2020-09-07 (×3): 2 [IU] via SUBCUTANEOUS
  Administered 2020-09-07 – 2020-09-08 (×3): 1 [IU] via SUBCUTANEOUS
  Administered 2020-09-08 (×2): 2 [IU] via SUBCUTANEOUS
  Administered 2020-09-08: 3 [IU] via SUBCUTANEOUS
  Administered 2020-09-09 – 2020-09-11 (×6): 1 [IU] via SUBCUTANEOUS
  Administered 2020-09-11 (×3): 2 [IU] via SUBCUTANEOUS
  Administered 2020-09-11 (×2): 3 [IU] via SUBCUTANEOUS
  Administered 2020-09-13 (×2): 2 [IU] via SUBCUTANEOUS
  Administered 2020-09-13: 1 [IU] via SUBCUTANEOUS
  Administered 2020-09-13: 3 [IU] via SUBCUTANEOUS
  Administered 2020-09-14 – 2020-09-15 (×6): 2 [IU] via SUBCUTANEOUS
  Administered 2020-09-15 – 2020-09-16 (×5): 1 [IU] via SUBCUTANEOUS
  Administered 2020-09-16 (×2): 2 [IU] via SUBCUTANEOUS
  Administered 2020-09-17 (×3): 1 [IU] via SUBCUTANEOUS
  Administered 2020-09-17: 2 [IU] via SUBCUTANEOUS
  Administered 2020-09-17: 1 [IU] via SUBCUTANEOUS
  Administered 2020-09-17: 2 [IU] via SUBCUTANEOUS
  Administered 2020-09-18: 1 [IU] via SUBCUTANEOUS
  Administered 2020-09-18: 2 [IU] via SUBCUTANEOUS
  Administered 2020-09-18 (×2): 1 [IU] via SUBCUTANEOUS
  Administered 2020-09-19 (×3): 2 [IU] via SUBCUTANEOUS
  Administered 2020-09-19: 1 [IU] via SUBCUTANEOUS
  Administered 2020-09-19: 2 [IU] via SUBCUTANEOUS
  Administered 2020-09-20 – 2020-09-21 (×4): 1 [IU] via SUBCUTANEOUS
  Administered 2020-09-21: 2 [IU] via SUBCUTANEOUS
  Administered 2020-09-21 (×3): 1 [IU] via SUBCUTANEOUS
  Administered 2020-09-21 – 2020-09-22 (×3): 2 [IU] via SUBCUTANEOUS
  Administered 2020-09-22: 1 [IU] via SUBCUTANEOUS
  Administered 2020-09-22 – 2020-09-23 (×4): 2 [IU] via SUBCUTANEOUS
  Administered 2020-09-23 (×2): 1 [IU] via SUBCUTANEOUS
  Administered 2020-09-23 (×2): 2 [IU] via SUBCUTANEOUS
  Administered 2020-09-23: 1 [IU] via SUBCUTANEOUS
  Administered 2020-09-24: 2 [IU] via SUBCUTANEOUS
  Administered 2020-09-24 – 2020-09-27 (×10): 1 [IU] via SUBCUTANEOUS
  Administered 2020-09-27: 2 [IU] via SUBCUTANEOUS
  Administered 2020-09-27 (×2): 1 [IU] via SUBCUTANEOUS
  Administered 2020-09-28 (×3): 2 [IU] via SUBCUTANEOUS
  Administered 2020-09-29 – 2020-09-30 (×6): 1 [IU] via SUBCUTANEOUS
  Administered 2020-09-30: 2 [IU] via SUBCUTANEOUS
  Administered 2020-09-30 – 2020-10-01 (×6): 1 [IU] via SUBCUTANEOUS
  Administered 2020-10-02: 2 [IU] via SUBCUTANEOUS
  Administered 2020-10-02 (×2): 1 [IU] via SUBCUTANEOUS
  Administered 2020-10-02 (×2): 2 [IU] via SUBCUTANEOUS
  Administered 2020-10-03: 1 [IU] via SUBCUTANEOUS
  Administered 2020-10-03 (×4): 2 [IU] via SUBCUTANEOUS
  Administered 2020-10-04 (×3): 1 [IU] via SUBCUTANEOUS
  Administered 2020-10-04: 2 [IU] via SUBCUTANEOUS
  Administered 2020-10-04 – 2020-10-05 (×2): 1 [IU] via SUBCUTANEOUS
  Administered 2020-10-05: 3 [IU] via SUBCUTANEOUS
  Administered 2020-10-05 (×2): 2 [IU] via SUBCUTANEOUS
  Administered 2020-10-06 – 2020-10-07 (×4): 1 [IU] via SUBCUTANEOUS
  Administered 2020-10-07: 2 [IU] via SUBCUTANEOUS
  Administered 2020-10-08 (×2): 1 [IU] via SUBCUTANEOUS
  Administered 2020-10-08: 2 [IU] via SUBCUTANEOUS
  Administered 2020-10-08: 1 [IU] via SUBCUTANEOUS
  Administered 2020-10-08: 2 [IU] via SUBCUTANEOUS
  Administered 2020-10-08 (×2): 1 [IU] via SUBCUTANEOUS
  Administered 2020-10-09: 2 [IU] via SUBCUTANEOUS
  Administered 2020-10-09 (×3): 1 [IU] via SUBCUTANEOUS
  Administered 2020-10-10 (×2): 2 [IU] via SUBCUTANEOUS
  Administered 2020-10-10 (×2): 1 [IU] via SUBCUTANEOUS
  Administered 2020-10-10: 2 [IU] via SUBCUTANEOUS
  Administered 2020-10-11 (×3): 1 [IU] via SUBCUTANEOUS
  Administered 2020-10-11 (×2): 2 [IU] via SUBCUTANEOUS
  Administered 2020-10-12: 1 [IU] via SUBCUTANEOUS
  Administered 2020-10-12: 2 [IU] via SUBCUTANEOUS
  Administered 2020-10-12 (×3): 1 [IU] via SUBCUTANEOUS
  Administered 2020-10-13: 3 [IU] via SUBCUTANEOUS
  Administered 2020-10-13: 2 [IU] via SUBCUTANEOUS
  Administered 2020-10-13 (×2): 1 [IU] via SUBCUTANEOUS
  Administered 2020-10-13 (×3): 3 [IU] via SUBCUTANEOUS
  Administered 2020-10-14 – 2020-10-17 (×8): 1 [IU] via SUBCUTANEOUS
  Administered 2020-10-18: 3 [IU] via SUBCUTANEOUS
  Administered 2020-10-18: 2 [IU] via SUBCUTANEOUS
  Administered 2020-10-18: 1 [IU] via SUBCUTANEOUS
  Administered 2020-10-18 – 2020-10-19 (×3): 2 [IU] via SUBCUTANEOUS
  Administered 2020-10-19 (×3): 3 [IU] via SUBCUTANEOUS
  Administered 2020-10-19 – 2020-10-20 (×3): 2 [IU] via SUBCUTANEOUS
  Administered 2020-10-20 (×2): 1 [IU] via SUBCUTANEOUS
  Administered 2020-10-20: 3 [IU] via SUBCUTANEOUS

## 2020-08-27 MED ORDER — INSULIN GLARGINE-YFGN 100 UNIT/ML ~~LOC~~ SOLN
20.0000 [IU] | Freq: Every day | SUBCUTANEOUS | Status: DC
  Administered 2020-08-28 – 2020-08-30 (×3): 20 [IU] via SUBCUTANEOUS
  Filled 2020-08-27 (×3): qty 0.2

## 2020-08-27 MED ORDER — DEXTROSE 10 % IV SOLN: INTRAVENOUS | Status: DC

## 2020-08-27 MED ORDER — SODIUM CHLORIDE 0.9 % IV SOLN
3.0000 g | INTRAVENOUS | Status: DC
  Administered 2020-08-27 – 2020-08-28 (×2): 3 g via INTRAVENOUS
  Filled 2020-08-27 (×2): qty 8

## 2020-08-27 MED ORDER — DEXTROSE 50 % IV SOLN
INTRAVENOUS | Status: AC
  Administered 2020-08-27: 50 mL
  Filled 2020-08-27: qty 50

## 2020-08-27 NOTE — Progress Notes (Addendum)
Patient ID: Luis Orr, male   DOB: 05-Aug-1950, 70 y.o.   MRN: [id number] [facility] Progress Note   Assessment/ Plan:   1. Acute kidney Injury-prolonged dialysis dependency: With baseline creatinine of around 0.4 and suffered dense ATN from septic shock-without any evidence of renal recovery to date.  Started CRRT on 7/6 for volume overload/anasarca and uremia and thereafter transitioned to intermittent hemodialysis; next dialysis treatment will be tomorrow.  No evidence of renal recovery at this point-likely end-stage renal disease. 2.  Septic shock/acute metabolic encephalopathy: Off pressors and antibiotics at this time and concern raised with worsening leukocytosis-cultures repeated yesterday and have been negative to date.  Repeat CXR yesterday showing increasing infiltrates. 3.  Chronic respiratory failure: Status post tracheostomy with ventilator management per CCM 4.  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  Chronic kidney disease/metabolic bone disease: Calcium and phosphorus level under decent control with hemodialysis, off binders  Subjective:   Tube feeds currently on hold after respiratory therapy raised concern of secretions from tracheostomy appearing to resemble tube feeds/aspiration.  Chest x-ray repeated this morning.   Objective:   BP 128/71   Pulse 82   Temp 98.4 F (36.9 C) (Axillary)   Resp 20   Ht 5' 6"  (1.676 m)   Wt 74.4 kg   SpO2 98%   BMI 26.47 kg/m   Intake/Output Summary (Last 24 hours) at 08/27/2020 1443 Last data filed at 08/26/2020 1800 Gross per 24 hour  Intake 540 ml  Output --  Net 540 ml   Weight change:   Physical Exam: Gen: Sleeping comfortably-opens eyes to calling out his name and tracks me around the room. CVS: Pulse regular rhythm, normal rate, S1 and S2 normal Resp: Clear to  auscultation bilaterally, on ventilator via tracheostomy.  Right IJ TDC Abd: Soft, PEG tube in situ-tube feeds on hold, left lower quadrant ostomy with intact bag.  Ext: Trace lower extremity/upper extremity edema, functionally quadriplegic  Imaging: DG CHEST PORT 1 VIEW  Result Date: 08/26/2020 CLINICAL DATA:  Follow-up bibasilar infiltrates EXAM: PORTABLE CHEST 1 VIEW COMPARISON:  08/09/2020 FINDINGS: Cardiac shadow is stable. Dialysis catheter is again seen. Persistent and slightly worsened bibasilar infiltrates are seen. No pneumothorax is seen. No bony abnormality is noted. IMPRESSION: Slight increase in bibasilar infiltrates when compared with the prior study. Electronically Signed   By: Inez Catalina M.D.   On: 08/26/2020 17:10    Labs: [employer] Recent Labs  Lab 08/21/20 0311 08/21/20 0415 08/22/20 0217 08/23/20 0303 08/23/20 1157 08/24/20 0446 08/25/20 1348 08/26/20 0314 08/26/20 0925  NA 132*   < > 131* 133* 133* 130* 130* 129* 132*  K 4.4   < > 4.9 3.5 4.8 4.9 5.3* 4.8 3.1*  CL 94*  --  91* 95*  --  92* 92* 90* 95*  CO2 19*  --  20* 22  --  21* 21* 23 24  GLUCOSE 173*  --  152* 254*  --  138* 169* 122* 178*  BUN 99*  --  150* 69*  --  102* 135* 145* 46*  CREATININE 1.21  --  1.68* 1.09  --  1.53* 2.02* 2.15* 1.06  CALCIUM 9.1  --  9.3 8.9  --  9.2 9.2 9.2 8.9  PHOS 4.6  --  5.4* 3.2  --  3.9 4.9* 4.3  --    < > =  values in this interval not displayed.   CBC Recent Labs  Lab 08/21/20 0415 08/22/20 0217 08/23/20 1157 08/26/20 0314  WBC  --  13.3*  --  28.8*  HGB 12.9* 11.3* 13.3 9.8*  HCT 38.0* 34.7* 39.0 30.8*  MCV  --  82.0  --  81.9  PLT  --  358  --  517*    Medications:     amiodarone  200 mg Per Tube Daily   apixaban  5 mg Per Tube BID   buPROPion  75 mg Per Tube BID   chlorhexidine gluconate (MEDLINE KIT)  15 mL Mouth Rinse BID   Chlorhexidine Gluconate Cloth  6 each Topical Q0600   Chlorhexidine Gluconate Cloth  6 each Topical Q0600   Chlorhexidine  Gluconate Cloth  6 each Topical Q0600   darbepoetin (ARANESP) injection - NON-DIALYSIS  40 mcg Subcutaneous Q Sun-1800   docusate  100 mg Per Tube BID   guaiFENesin  5 mL Per Tube BID   insulin aspart  0-15 Units Subcutaneous Q4H   insulin glargine-yfgn  40 Units Subcutaneous Daily   mouth rinse  15 mL Mouth Rinse 10 times per day   midodrine  10 mg Per Tube Q8H   multivitamin  1 tablet Per Tube QHS   pantoprazole sodium  40 mg Per Tube QHS   polyethylene glycol  17 g Per Tube Daily   sertraline  100 mg Per Tube Daily   sodium chloride flush  10-40 mL Intracatheter Q12H   Elmarie Shiley, MD 08/27/2020, 6:07 AM

## 2020-08-27 NOTE — Progress Notes (Addendum)
[facility] PROGRESS NOTE  Luis Orr YIR:[id number] DOB: 1950/12/27 DOA: 07/01/2020 PCP: Townsend Roger, MD  Status: Remains inpatient appropriate because:Hemodynamically unstable, Persistent severe electrolyte disturbances, Unsafe d/c plan, and Inpatient level of care appropriate due to severity of illness  Dispo: The patient is from:  [facility]              Anticipated d/c is to: SNF Vent capable w/ access to HD              Patient currently is not medically stable to d/c.   Difficult to place patient Yes    Level of care: ICU  Code Status: Partial (no CPR and no defibrillation or cardioversion) Family Communication:  DVT prophylaxis: Eliquis COVID vaccination status: Unknown    HPI:  70 y.o. 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 Patient presented secondary to altered mental status and hypotension with recurrent pneumonia requiring vasopressor support and ICU admission.he has had multiple recent admissions for sepsis secondary to various infections including UTIs, pneumonias and bacteremia in setting of MDR Klebsiella, MRSA and pseudomonas. Most recent admission September 2021 in setting of sepsis due to MDR Klebsiella with right ureteral calculus s/p double J ureteral stent placement and treated with meropenem x 10days. He was discharged back to [facility].  ICU significant events: 6/29 admitted to ICU for septic shock on levo to maintain MAP>65, continued on vent support 6/29 Blood Cx>> staph species in 1 of 4 cultures drawn>> suspect contaminant 6/30 Off pressors 7/2 ID Consult for MDR acinetobacter in trach asp, Meropenem changed to unasyn 7/2 PRBC transfusion, iron replacement 7/5 Pressors added again. Central line placed. Drainage from gastrostomy tube >> CT Abd without fluid collection around gastrostomy tube but with diffuse body wall edema. Echo EF 50-55%, mild LVH 7/6 Renal  Replacement Therapy ; Code status changed to partial Code (DNR) 7/7 Transfuse 1U PRBC 7/9 add solu cortef 7/10 off pressors, weaning stress steroids 7/11 Pressors added back on again 7/12 ID consult due to rising WBC, hypothermia, hypotension concern for worsening infection. Vancomycin added. Repeat blood cx collected. 7/15 CRRT stopped 7/17 Restarted on low dose levo. No evidence of new infection. Held off on antibiotics. Nephrology contacted family and decision was made to continue iHD this week to allow additional time for renal recovery. Patient is not the best long term iHD candidate but family wants aggressive care 7/18 plans for iHD, tolerated with 2L removed 719 slight drop in hgb to 6.9 will transfuse 1 unit PRBC 7/22 > 7/26: Tolerating HD. TOC consulted for placement. Will likely need out of state placement. 8/15 WBCs greater than 28,000, hypoglycemia, procalcitonin >18, chest x-ray consistent with pneumonia, recent blood cultures consistent with contamination.  Unasyn IV resumed after sputum culture obtained   Subjective: Nonverbal, no eye contact made, unable to follow commands.  Objective: Vitals:   08/27/20 1133 08/27/20 1134  BP: 133/82   Pulse: 79   Resp: 20   Temp:    SpO2: 100% 100%    Intake/Output Summary (Last 24 hours) at 08/27/2020 1152 Last data filed at 08/27/2020 0800 Gross per 24 hour  Intake 527.2 ml  Output --  Net 527.2 ml   Filed Weights   08/24/20 0312 08/25/20 0355 08/26/20 0300  Weight: 75.2 kg 74.8 kg 74.4 kg    Exam:  Constitutional: NAD, not agitated Respiratory: Trach: 6.0 XLT cuffed, Vent settings: PRVC mode, Fio2 40%, rate 20, VT 510, PEEP  5, clear to auscultation bilaterally, no wheezing, no crackles. Respiratory effort per vent (diaphragm paralyzed).  Cardiovascular: SR, no murmurs / rubs / gallops. Suboptimal BP but no hypotension. No extremity edema except for focal bilateral hand edema that appears to be dependent in nature. 1+  pedal pulses. No JVD Abdomen: no tenderness, no masses palpated. Bowel sounds positive. LLQ colostomy, PEG tube for feeds Neurologic: CN 2-12 grossly intact on visual inspection- pt unable to participate w/ exam. Known quadraplegia at C3-C5 level Psychiatric: Nonverbal and unable to assess.   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 Now resolved and back to baseline.  Currently on ventilator support per PCCM and is baseline. To to possible aspiration event overnight tube feedings placed on hold and because of associated hypoglycemia was placed on [id number] without evidence of obstruction so have decided to resume tube feedings as of 115 on 8/15.  Later it was determined that what appeared to be tube feeding was actually the patient's very thick pulmonary secretions which have subsequently been sent for culture.  Sepsis presumed secondary to pulmonary etiology WBC greater than 20,000 for the past 48 hours only low-grade fever O2 needed increased to as high as 50% but is now down to 40% Procalcitonin elevated greater than 18 and patient with recent hypoglycemia concerning for sepsis physiology Lactic acid normal and CRP elevated at 28.4  Blood cultures with contaminant Check sputum culture and initiate empiric Unasyn to cover possible reemergence of Acinetobacter pulmonary infection Patient also documented with tube feeding oozing around trach over the past 24 hours so potentially aspiration pneumonitis contributing Abdominal films without evidence of obstruction with bilateral but right greater than left opacities   Ventilator associated pneumonia Present on admission.  Sputum culture significant for acinetobacter baumannii/on contact precautions. Completed Unasyn x14 days.  Persistent but improving leukocytosis and intermittent low-grade fever spikes.  ID  has been following at times   AKI with oliguria/volume overload/ New CKD requiring HD Now appears patient is anuric. Nephrology is on board and was initially managing with CRRT and have transitioned to intermittent HD and tolerating.   Palliative care has been consulted and are following.  TDC placed on 7/26-family wants aggressive measures so anticipate will need permanent access -Nephrology recommendations: Continuing HD   Septic shock 2/2 VAP Resolved Present on admission. Secondary to pneumonia. Patient required vasopressor support which is now weaned off.  Initial blood culture likely contaminant. Repeat blood cultures negative.  History of paroxysmal atrial fibrillation Patient was on amiodarone prior to admission  Currently maintaining sinus rhythm TSH was 0.922 on 04/15/8766   Acute metabolic encephalopathy Resolved Secondary to acute illness. CT head negative for acute process.  Reportedly back at baseline.   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   Shock liver Secondary to septic shock. Improving. 7/20 last labs that consisted of hepatic function studies demonstrated an AST of 60 and an ALT of 69 with normal bilirubin-obtain hepatic function panel today on 8/10   Quadriplegia secondary to compressive myelopathy.  Previously had [facility] but with poor performance scores as well as poor rehabilitation potential [facility] will not accept him back if he is  HD dependent since he must be able to sit up in a chair for dialysis.   Microcytic anemia/Acute anemia Patient required 4 units of PRBC to date. Thought to be secondary to a combination of acute illness, chronic disease and  iron deficiency. IV iron given. Stable.   Severe protein calorie malnutrition Nutrition Problem: Increased nutrient needs Etiology: wound healing Signs/Symptoms: estimated needs Interventions: Tube feeding, Prostat, MVI Estimated body mass index is 26.47 kg/m as calculated from the following:   Height as of this encounter: _0  (1.676 m).   Weight as of this encounter: 74.4 kg.   Pressure injury Stage IV medial/posterior sacrum present on admission.  Right/posterior/lateral ankle unknown if present on admission.  DTI left ear, not present on admission. (4 additional documentation see wound care notes)    Data Reviewed: Basic Metabolic Panel: Recent Labs  Lab 08/22/20 0217 08/23/20 0303 08/23/20 1157 08/24/20 0446 08/25/20 1348 08/26/20 0314 08/26/20 0925 08/27/20 0742  NA 131* 133*   < > 130* 130* 129* 132* 132*  K 4.9 3.5   < > 4.9 5.3* 4.8 3.1* 4.1  CL 91* 95*  --  92* 92* 90* 95* 94*  CO2 20* 22  --  21* 21* _1 GLUCOSE 152* 254*  --  138* 169* 122* 178* 76  BUN 150* 69*  --  102* 135* 145* 46* 70*  CREATININE 1.68* 1.09  --  1.53* 2.02* 2.15* 1.06 1.42*  CALCIUM 9.3 8.9  --  9.2 9.2 9.2 8.9 8.9  MG  --   --   --   --   --   --   --  2.1  PHOS 5.4* 3.2  --  3.9 4.9* 4.3  --   --    < > = values in this interval not displayed.   Liver Function Tests: Recent Labs  Lab 08/22/20 0217 08/23/20 0303 08/24/20 0446 08/25/20 1348 08/26/20 0314 08/27/20 0742  AST 46*  --   --   --   --  53*  ALT 32  --   --   --   --  37  ALKPHOS 248*  --   --   --   --  333*  BILITOT 1.1  --   --   --   --  1.4*  PROT 8.1  --   --   --   --  6.6  ALBUMIN 2.5*  2.6* 2.3* 2.2* 2.0* 1.9* 1.7*   No results for input(s): LIPASE, AMYLASE in the last 168 hours. No results for input(s): AMMONIA in the last 168 hours. CBC: Recent Labs  Lab 08/21/20 0415 08/22/20 0217 08/23/20 1157 08/26/20 0314 08/27/20 0742  WBC  --  13.3*  --  28.8* 28.7*  NEUTROABS  --   --   --   --   22.9*  HGB 12.9* 11.3* 13.3 9.8* 9.3*  HCT 38.0* 34.7* 39.0 30.8* 28.2*  MCV  --  82.0  --  81.9 81.3  PLT  --  358  --  517* 489*   Cardiac Enzymes: No results for input(s): CKTOTAL, CKMB, CKMBINDEX, TROPONINI in the last 168 hours. BNP (last 3 results) Recent Labs    06/25/2020 1922  BNP 135.0*    ProBNP (last 3 results) No results for input(s): PROBNP in the last 8760 hours.  CBG: Recent Labs  Lab 08/27/20 0112 08/27/20 0311 08/27/20 0427 08/27/20 0804 08/27/20 1125  GLUCAP 102* 59* 121* 73 73  Studies: DG CHEST PORT 1 VIEW  Result Date: 08/27/2020 CLINICAL DATA:  Respiratory failure EXAM: PORTABLE CHEST - 1 VIEW COMPARISON:  the previous day's study FINDINGS: Tunneled right IJ HD catheter to the proximal right atrium. Tracheostomy device in stable position. Patchy airspace opacities in the lung bases right greater than left, slightly improved on the left since previous. Heart size and mediastinal contours are within normal limits. Blunting of costophrenic angle suggesting small layering pleural effusions. No pneumothorax. Anterior vertebral endplate spurring at multiple levels in the mid and lower thoracic spine. IMPRESSION: Asymmetric edema or infiltrates, slightly improved on the left since previous. Support hardware stable. Electronically Signed   By: Lucrezia Europe M.D.   On: 08/27/2020 07:35   DG CHEST PORT 1 VIEW  Result Date: 08/26/2020 CLINICAL DATA:  Follow-up bibasilar infiltrates EXAM: PORTABLE CHEST 1 VIEW COMPARISON:  08/09/2020 FINDINGS: Cardiac shadow is stable. Dialysis catheter is again seen. Persistent and slightly worsened bibasilar infiltrates are seen. No pneumothorax is seen. No bony abnormality is noted. IMPRESSION: Slight increase in bibasilar infiltrates when compared with the prior study. Electronically Signed   By: Inez Catalina M.D.   On: 08/26/2020 17:10   DG Abd Portable 1V  Result Date: 08/27/2020 CLINICAL DATA:  Feeding tube malfunction.  EXAM: PORTABLE ABDOMEN - 1 VIEW COMPARISON:  Chest radiograph, 08/27/2020. CT abdomen pelvis, 07/26/2020. FINDINGS: Suboptimal evaluation, without enteral contrast opacification via the gastrostomy tube. Gastrostomy tube projecting at the expected location, at the epigastrium. The imaged bowel is not obstructed. Central venous catheter and right ureteral stent, incompletely imaged. No acute osseous abnormality. IMPRESSION: Suboptimal evaluation, without enteral contrast opacification via the gastrostomy tube, within this constraint; Gastrostomy tube projecting at the expected location Electronically Signed   By: Michaelle Birks M.D.   On: 08/27/2020 09:14    Scheduled Meds:  amiodarone  200 mg Per Tube Daily   apixaban  5 mg Per Tube BID   buPROPion  75 mg Per Tube BID   chlorhexidine gluconate (MEDLINE KIT)  15 mL Mouth Rinse BID   Chlorhexidine Gluconate Cloth  6 each Topical Q0600   darbepoetin (ARANESP) injection - NON-DIALYSIS  40 mcg Subcutaneous Q Sun-1800   docusate  100 mg Per Tube BID   guaiFENesin  5 mL Per Tube BID   insulin glargine-yfgn  40 Units Subcutaneous Daily   mouth rinse  15 mL Mouth Rinse 10 times per day   midodrine  10 mg Per Tube Q8H   multivitamin  1 tablet Per Tube QHS   pantoprazole sodium  40 mg Per Tube QHS   polyethylene glycol  17 g Per Tube Daily   sertraline  100 mg Per Tube Daily   sodium chloride flush  10-40 mL Intracatheter Q12H   Continuous Infusions:  sodium chloride Stopped (07/26/20 0824)   sodium chloride 250 mL (07/31/20 1428)   sodium chloride     sodium chloride     dextrose 40 mL/hr at 08/27/20 0800   feeding supplement (NEPRO CARB STEADY) 45 mL/hr at 08/26/20 1400    Active Problems:   Septic shock (HCC)   Pressure injury of skin   Acute respiratory failure with hypoxia (HCC)   AKI (acute kidney injury) ([place])   Hypotension   Goals of care, counseling/discussion   Quadriplegia ([place])   Chronic kidney disease requiring chronic dialysis  ([place])   Hyperglycemia   Sepsis due to Acinetobacter baumannii ([place])   Pneumonia due to Acinetobacter species [facility])   Consultants: PCCM Nephrology  Infectious disease Palliative care medicine  Procedures: Echocardiogram Insertion of double-lumen HD catheter  Antibiotics: Cefepime x1 dose Meropenem 6/29 through 7/2 Vancomycin 6/29 through 6/30 Unasyn 7/2 through 7/15 Unasyn 8/15   Time spent: 35 minutes    Erin Hearing ANP  [facility] 7 am - 330 pm/M-F for direct patient care and secure chat Please refer to Amion for contact info 47  days

## 2020-08-27 NOTE — Progress Notes (Signed)
Pharmacy Antibiotic Note  Luis Orr is a 70 y.o. male admitted on 06/17/2020 presenting with AMS, hx quadriplegia, chronic trach/vent, increasing WBC and concern for aspiration.  Pharmacy has been consulted for Unasyn dosing.  [facility] now iHD  Plan: Unasyn 3g IV every 24 hours Monitor HD schedule, tracheal aspirate Cx and clinical progression for LOT and ability to narrow  Height: '5\' 6"'$  (167.6 cm) Weight: 74.4 kg (164 lb 0.4 oz) IBW/kg (Calculated) : 63.8  Temp (24hrs), Avg:98.6 F (37 C), Min:98.3 F (36.8 C), Max:99 F (37.2 C)  Recent Labs  Lab 08/22/20 0217 08/23/20 0303 08/24/20 0446 08/25/20 1348 08/26/20 0314 08/26/20 0925 08/27/20 0742  WBC 13.3*  --   --   --  28.8*  --  28.7*  CREATININE 1.68*   < > 1.53* 2.02* 2.15* 1.06 1.42*   < > = values in this interval not displayed.    Estimated Creatinine Clearance: 43.7 mL/min (A) (by C-G formula based on SCr of 1.42 mg/dL (H)).    Allergies  Allergen Reactions   Adhesive [Tape] Rash    CTX  (unk start date) >>6/29 *Recent [place] nebs 6/8 >>6/29 (planned thru 7/2) cefepime x1 on 6/29 vancomycin 6/29 >> Meropenem 6/29 >>7/2 Unasyn 7/2>> 7/15 Vanc 7/12 >> 7/14   VR 6/30 = 18 (14 hrs after one time dose 1500 mg) VT 7/1 prior to 3rd - 34, accumulating and not clearing despite UOP   6/29 MRSA PCR positive 6/29 BCx 1/4 Staph species (BCID Staph species) 6/29 UCx -suggest recollection 6/30 TA  Acinetobacter (MIC 8 Unasyn, otherwise resistant) 7/1 UCx resent >> mult species 7/12 bcx: negative 8/14 BCx: 1/4 staph species, likely contaminant 8/15 TA: sent  Bertis Ruddy, PharmD Clinical Pharmacist Please check AMION for all [place] numbers 08/27/2020 1:00 PM

## 2020-08-27 NOTE — Progress Notes (Addendum)
NAME:  Luis Orr, MRN:  [id number], DOB:  [birth year], LOS: 5 ADMISSION DATE:  07/07/2020, CONSULTATION DATE:  07/04/2020 REFERRING MD:  Dr Francia Greaves, EDP CHIEF COMPLAINT:  septic shock    History of Present Illness:  70 yo male from [facility] with hx of C3-C5 compressive myelopathy with functional quadriplegia and chronic trach/vent presented to ED with altered mental status and hypotension with recurrent HCAP.  Has multiple recent admissions for sepsis secondary to various infections including UTIs, pneumonias and bacteremia in setting of MDR Klebsiella, MRSA and pseudomonas. Most recent admission September 2021 in setting of sepsis due to MDR Klebsiella with right ureteral calculus s/p double J ureteral stent placement and treated with meropenem x 10days.  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 Central line placed. Drainage from gastrostomy tube >> CT Abd without fluid collection around gastrostomy tube but with diffuse body wall edema. Echo EF 50-55%, mild LVH  7/6 Renal Replacement Therapy ; Code status changed to partial Code (DNR) 7/7 Transfuse 1U PRBC 7/9 add solu cortef 7/10 off pressors, weaning stress steroids  7/11 Pressors added back on again 7/12 ID consult due to rising WBC, hypothermia, hypotension concern for worsening infection. Vancomycin added. Repeat blood cx collected. 7/15 CRRT stopped   7/17 Restarted on low dose levo. No evidence of new infection. Held off on antibiotics. Nephrology contacted family and decision was made to continue iHD this week to allow additional time for renal recovery. Patient is not a long term iHD candidate  7/18 plans for iHD, tolerated with 2L removed  7/19 slight drop in hgb to 6.9 will transfuse 1 unit PRBC  7/20 To [facility], PCCM following for trach / vent needs 7/22>> tolerating HD, will need out of state placement 8/4 last PCCM visit. 8/4 -8/8: No issues. CM workin gon out of state placement  8/15 no acute changes. Continues on PRVC, was apneic with PSV attempt. Did wean to 40% FiO2   Interim History / Subjective:  EN held with concern for possible aspiration but secretions do not seem consistent with tube feeds.   Apneic with PSV  FiO2 weaned to 40%   Complaining of back pain   Objective   Blood pressure 128/71, pulse 82, temperature 98.4 F (36.9 C), temperature source Axillary, resp. rate 20, height '5\' 6"'$  (1.676 m), weight 74.4 kg, SpO2 98 %.    Vent Mode: PRVC FiO2 (%):  [50 %] 50 % Set Rate:  [20 bmp] 20 bmp Vt Set:  [510 mL] 510 mL PEEP:  [5 cmH20] 5 cmH20 Plateau Pressure:  [25 cmH20-33 cmH20] 32 cmH20   Intake/Output Summary (Last 24 hours) at 08/27/2020 0726 Last data filed at 08/26/2020 1800 Gross per 24 hour  Intake 495 ml  Output --  Net 495 ml   Filed Weights   08/24/20 0312 08/25/20 0355 08/26/20 0300  Weight: 75.2 kg 74.8 kg 74.4 kg    Physical Exam: General:  Chronically  ill older adult M reclined in bed trach vent NAD  HEENT: NCAT pink mm trach secure pink mmm  Neuro: Opens eyes to command. Nods/shakes head to command and to questions.  CV: reg rate cap refill brisk  PULM: Coarse breath sounds. Symmetrical chest expansion. PRVC  GI: soft round + PEG  Extremities: no acute deformity. No pitting edema. Symmetrical muscle wasting  Skin: c/d/w no rash   Resolved Hospital Problem list    Hyperkalemia Tachycardia Hypotension Thrombocytosis  AKI Septic shock 2nd to HCAP and UTI all present on admission -Acinetobacter in sputum culture from 07/12/20. Completed 14 days of Unasyn for -Of pressors on 4/14. Restarted  levo 7/17.  Assessment & Plan:   Acute on chronic hypoxic and hypercarbic respiratory failure VDRF S/p tracheostomy C3-C5 compressive myelopathy  No acute issues.  Stable on vent FiO2 weaned to 40%  Plan Full MV support No wean VAP, PPI   Back pain P -PRN oxy and fent    Goals of Care -"Partial Code"  -patient seems to nod/shake head appropriately to questions.  -we talked about overall goals of care with trach/vent, HD, and facility placement. I asked directly he is ok with this plan and he shook his head no. We talked about current level of care support and I asked if this level of support is what he wants, to which he shook his head no. I talked about the alternative to this level of care and asked the patient if he had thought about palliative care -- he did not nod or shake head but diverted eyes. I asked if he and his wife and talked about this and he closed his eyes.  -I encouraged pt to think about the goals of care that best align with his wishes and would rec ongoing [place] talks     Eliseo Gum MSN, AGACNP-BC [place] for pager 08/27/2020, 7:26 AM

## 2020-08-27 NOTE — Progress Notes (Signed)
Inpatient Diabetes Program Recommendations  AACE/ADA: New Consensus Statement on Inpatient Glycemic Control   Target Ranges:  Prepandial:   less than 140 mg/dL      Peak postprandial:   less than 180 mg/dL (1-2 hours)      Critically ill patients:  140 - 180 mg/dL  Results for Luis Orr, Luis Orr (MRN [id number]) as of 08/27/2020 13:20  Ref. Range 08/27/2020 00:01 08/27/2020 01:12 08/27/2020 03:11 08/27/2020 04:27 08/27/2020 08:04 08/27/2020 11:25  Glucose-Capillary Latest Ref Range: 70 - 99 mg/dL 52 (L) 102 (H) 59 (L) 121 (H) 73 73   Results for Luis Orr, Luis Orr (MRN [id number]) as of 08/27/2020 13:20  Ref. Range 08/26/2020 07:33 08/26/2020 11:29 08/26/2020 15:13 08/26/2020 20:36  Glucose-Capillary Latest Ref Range: 70 - 99 mg/dL 108 (H)   Semglee 40 units '@9'$ :16 166 (H)  Novolog 3 units 93 85   Review of Glycemic Control Diabetes history: DM2 Outpatient Diabetes medications: Lantus 30 units daily, Regular 2-10 units Q6H Current orders for Inpatient glycemic control: Semglee 40 units daily; Neptro @ 40 ml/hr  Inpatient Diabetes Program Recommendations:    Insulin: Please consider decreasing Semglee to 20 units daily and ordering Novolog 0-6 units Q4H.  Thanks, Barnie Alderman, RN, MSN, CDE Diabetes Coordinator Inpatient Diabetes Program [phone] (Team Pager from 8am to 5pm)

## 2020-08-27 NOTE — Progress Notes (Signed)
PHARMACY - PHYSICIAN COMMUNICATION CRITICAL VALUE ALERT - BLOOD CULTURE IDENTIFICATION (BCID)  Luis Orr is an 70 y.o. male who presented to [facility] on 07/07/2020 with a chief complaint of AMS and hypotension with recurrent HAP.   Assessment:   1/3 Bcx with GPC in clusters BCID detected Staphylococcus spp. Most likely represents a contaminant  Name of physician (or Provider) Contacted: Berle Mull MD  Current antibiotics: None  Changes to prescribed antibiotics recommended:  Staph species likely representing contaminant from 1/3 bottles. No additional antibiotics recommended.   Results for orders placed or performed during the hospital encounter of 07/06/2020  Blood Culture ID Panel (Reflexed) (Collected: 06/15/2020  6:42 PM)  Result Value Ref Range   Enterococcus faecalis NOT DETECTED NOT DETECTED   Enterococcus Faecium NOT DETECTED NOT DETECTED   Listeria monocytogenes NOT DETECTED NOT DETECTED   Staphylococcus species DETECTED (A) NOT DETECTED   Staphylococcus aureus (BCID) NOT DETECTED NOT DETECTED   Staphylococcus epidermidis NOT DETECTED NOT DETECTED   Staphylococcus lugdunensis NOT DETECTED NOT DETECTED   Streptococcus species NOT DETECTED NOT DETECTED   Streptococcus agalactiae NOT DETECTED NOT DETECTED   Streptococcus pneumoniae NOT DETECTED NOT DETECTED   Streptococcus pyogenes NOT DETECTED NOT DETECTED   A.calcoaceticus-baumannii NOT DETECTED NOT DETECTED   Bacteroides fragilis NOT DETECTED NOT DETECTED   Enterobacterales NOT DETECTED NOT DETECTED   Enterobacter cloacae complex NOT DETECTED NOT DETECTED   Escherichia coli NOT DETECTED NOT DETECTED   Klebsiella aerogenes NOT DETECTED NOT DETECTED   Klebsiella oxytoca NOT DETECTED NOT DETECTED   Klebsiella pneumoniae NOT DETECTED NOT DETECTED   Proteus species NOT DETECTED NOT DETECTED   Salmonella species NOT DETECTED NOT DETECTED   Serratia marcescens NOT DETECTED NOT DETECTED   Haemophilus influenzae  NOT DETECTED NOT DETECTED   Neisseria meningitidis NOT DETECTED NOT DETECTED   Pseudomonas aeruginosa NOT DETECTED NOT DETECTED   Stenotrophomonas maltophilia NOT DETECTED NOT DETECTED   Candida albicans NOT DETECTED NOT DETECTED   Candida auris NOT DETECTED NOT DETECTED   Candida glabrata NOT DETECTED NOT DETECTED   Candida krusei NOT DETECTED NOT DETECTED   Candida parapsilosis NOT DETECTED NOT DETECTED   Candida tropicalis NOT DETECTED NOT DETECTED   Cryptococcus neoformans/gattii NOT DETECTED NOT DETECTED   Lestine Box, PharmD PGY2 Infectious Diseases Pharmacy Resident   Please check [email or web address] for unit-specific pharmacy phone numbers

## 2020-08-27 NOTE — Progress Notes (Signed)
Assisted tele visit to patient with family member.  Shaheem Pichon M, RN   

## 2020-08-27 NOTE — Progress Notes (Signed)
[facility] PROGRESS NOTE  Patient: Luis Orr [id number]   PCP: Townsend Roger, MD DOB: 12-09-1950   DOA: 06/28/2020   DOS: 08/27/2020    Subjective: Some respiratory distress overnight.  Tracheostomy suction showed feeding tube.  Objective:  Vitals:   08/27/20 1509 08/27/20 1600  BP: 123/74 118/76  Pulse: 82 79  Resp:  20  Temp:  98.8 F (37.1 C)  SpO2: 100% 100%    S1-S2 present. Basal crackles. Bowel sound present. Edema generalized all over.  Assessment and plan: Sepsis leukocytosis worsening. Currently on IV Unasyn. Will monitor.  Diarrhea. Anticoagulation scheduled stool softener. Will discontinue.  On tube feeding. Overnight as well as held as the patient. Currently tube feed resumed.  Access issue. Patient will require IR guided PICC line placement. Currently using HD catheter.  Author: Berle Mull, MD [facility] 08/27/2020 5:52 PM   If 7PM-7AM, please contact night-coverage at [email or web address]

## 2020-08-28 ENCOUNTER — Inpatient Hospital Stay (HOSPITAL_COMMUNITY): Payer: 59

## 2020-08-28 DIAGNOSIS — A419 Sepsis, unspecified organism: Secondary | ICD-10-CM | POA: Diagnosis not present

## 2020-08-28 DIAGNOSIS — G8250 Quadriplegia, unspecified: Secondary | ICD-10-CM | POA: Diagnosis not present

## 2020-08-28 DIAGNOSIS — J158 Pneumonia due to other specified bacteria: Secondary | ICD-10-CM | POA: Diagnosis not present

## 2020-08-28 DIAGNOSIS — A4159 Other Gram-negative sepsis: Secondary | ICD-10-CM | POA: Diagnosis not present

## 2020-08-28 HISTORY — PX: IR US GUIDE VASC ACCESS LEFT: IMG2389

## 2020-08-28 HISTORY — PX: IR FLUORO GUIDE CV LINE LEFT: IMG2282

## 2020-08-28 LAB — CBC WITH DIFFERENTIAL/PLATELET
Abs Immature Granulocytes: 1.61 10*3/uL — ABNORMAL HIGH (ref 0.00–0.07)
Basophils Absolute: 0.1 10*3/uL (ref 0.0–0.1)
Basophils Relative: 1 %
Eosinophils Absolute: 0.2 10*3/uL (ref 0.0–0.5)
Eosinophils Relative: 1 %
HCT: 28.8 % — ABNORMAL LOW (ref 39.0–52.0)
Hemoglobin: 9.3 g/dL — ABNORMAL LOW (ref 13.0–17.0)
Immature Granulocytes: 6 %
Lymphocytes Relative: 5 %
Lymphs Abs: 1.3 10*3/uL (ref 0.7–4.0)
MCH: 26.3 pg (ref 26.0–34.0)
MCHC: 32.3 g/dL (ref 30.0–36.0)
MCV: 81.4 fL (ref 80.0–100.0)
Monocytes Absolute: 2 10*3/uL — ABNORMAL HIGH (ref 0.1–1.0)
Monocytes Relative: 7 %
Neutro Abs: 21.8 10*3/uL — ABNORMAL HIGH (ref 1.7–7.7)
Neutrophils Relative %: 80 %
Platelets: 488 10*3/uL — ABNORMAL HIGH (ref 150–400)
RBC: 3.54 MIL/uL — ABNORMAL LOW (ref 4.22–5.81)
RDW: 20.3 % — ABNORMAL HIGH (ref 11.5–15.5)
WBC: 26.9 10*3/uL — ABNORMAL HIGH (ref 4.0–10.5)
nRBC: 0.1 % (ref 0.0–0.2)

## 2020-08-28 LAB — PROCALCITONIN: Procalcitonin: 14.42 ng/mL

## 2020-08-28 LAB — COMPREHENSIVE METABOLIC PANEL
ALT: 34 U/L (ref 0–44)
AST: 47 U/L — ABNORMAL HIGH (ref 15–41)
Albumin: 1.7 g/dL — ABNORMAL LOW (ref 3.5–5.0)
Alkaline Phosphatase: 322 U/L — ABNORMAL HIGH (ref 38–126)
Anion gap: 14 (ref 5–15)
BUN: 86 mg/dL — ABNORMAL HIGH (ref 8–23)
CO2: 24 mmol/L (ref 22–32)
Calcium: 9.1 mg/dL (ref 8.9–10.3)
Chloride: 93 mmol/L — ABNORMAL LOW (ref 98–111)
Creatinine, Ser: 1.69 mg/dL — ABNORMAL HIGH (ref 0.61–1.24)
GFR, Estimated: 43 mL/min — ABNORMAL LOW (ref >60)
Glucose, Bld: 164 mg/dL — ABNORMAL HIGH (ref 70–99)
Potassium: 4 mmol/L (ref 3.5–5.1)
Sodium: 131 mmol/L — ABNORMAL LOW (ref 135–145)
Total Bilirubin: 0.9 mg/dL (ref 0.3–1.2)
Total Protein: 6.6 g/dL (ref 6.5–8.1)

## 2020-08-28 LAB — GLUCOSE, CAPILLARY
Glucose-Capillary: 108 mg/dL — ABNORMAL HIGH (ref 70–99)
Glucose-Capillary: 127 mg/dL — ABNORMAL HIGH (ref 70–99)
Glucose-Capillary: 141 mg/dL — ABNORMAL HIGH (ref 70–99)
Glucose-Capillary: 145 mg/dL — ABNORMAL HIGH (ref 70–99)
Glucose-Capillary: 145 mg/dL — ABNORMAL HIGH (ref 70–99)
Glucose-Capillary: 150 mg/dL — ABNORMAL HIGH (ref 70–99)
Glucose-Capillary: 162 mg/dL — ABNORMAL HIGH (ref 70–99)

## 2020-08-28 LAB — POCT ACTIVATED CLOTTING TIME: Activated Clotting Time: 196 seconds

## 2020-08-28 LAB — C-REACTIVE PROTEIN: CRP: 25.5 mg/dL — ABNORMAL HIGH (ref <1.0)

## 2020-08-28 MED ORDER — PRISMASOL BGK 4/2.5 32-4-2.5 MEQ/L REPLACEMENT SOLN
Status: DC
  Filled 2020-08-28 (×3): qty 5000

## 2020-08-28 MED ORDER — HEPARIN (PORCINE) 2000 UNITS/L FOR CRRT: INTRAVENOUS_CENTRAL | Status: DC | PRN

## 2020-08-28 MED ORDER — LIDOCAINE HCL (PF) 1 % IJ SOLN
INTRAMUSCULAR | Status: DC | PRN
  Administered 2020-08-28: 10 mL

## 2020-08-28 MED ORDER — HEPARIN SODIUM (PORCINE) 1000 UNIT/ML DIALYSIS
1000.0000 [IU] | INTRAMUSCULAR | Status: DC | PRN
  Administered 2020-08-29: 4000 [IU] via INTRAVENOUS_CENTRAL
  Administered 2020-08-31: 3800 [IU] via INTRAVENOUS_CENTRAL
  Filled 2020-08-28 (×2): qty 6
  Filled 2020-08-28 (×2): qty 4
  Filled 2020-08-28: qty 6

## 2020-08-28 MED ORDER — LIDOCAINE HCL 1 % IJ SOLN
INTRAMUSCULAR | Status: AC
  Filled 2020-08-28: qty 20

## 2020-08-28 MED ORDER — PRISMASOL BGK 4/2.5 32-4-2.5 MEQ/L EC SOLN
Status: DC
  Filled 2020-08-28 (×9): qty 5000

## 2020-08-28 MED ORDER — SODIUM CHLORIDE 0.9 % IV SOLN
350.0000 [IU]/h | INTRAVENOUS | Status: DC
  Administered 2020-08-28: 350 [IU]/h via INTRAVENOUS_CENTRAL
  Filled 2020-08-28: qty 2

## 2020-08-28 MED ORDER — MIDODRINE HCL 5 MG PO TABS
5.0000 mg | ORAL_TABLET | Freq: Three times a day (TID) | ORAL | Status: DC
  Administered 2020-08-28 – 2020-09-07 (×27): 5 mg
  Filled 2020-08-28 (×27): qty 1

## 2020-08-28 NOTE — Progress Notes (Signed)
Video connection link was texted to participant via mobile number provided. Connection successful.

## 2020-08-28 NOTE — Progress Notes (Signed)
Patient ID: Luis Orr, male   DOB: 1950-04-07, 70 y.o.   MRN: [id number] [facility] Progress Note   Assessment/ Plan:   1. Acute kidney Injury-prolonged dialysis dependency: With baseline creatinine of around 0.4 and suffered dense ATN from septic shock-without any evidence of renal recovery to date-likely end-stage renal disease at this point.  Started CRRT on 7/6 for volume overload/anasarca and uremia and thereafter transitioned to intermittent hemodialysis; will get dialysis today 8/16.  2.  Septic shock/acute metabolic encephalopathy: Off pressors and antibiotics at this time and appears to be having slight improvement of leukocytosis-cultures recently repeated have been negative to date.  CXR remarkable for increased infiltrates. 3.  Chronic respiratory failure: Status post tracheostomy with ventilator management per CCM 4.  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  Chronic kidney disease/metabolic bone disease: Calcium and phosphorus level under decent control with hemodialysis, off binders  Subjective:   Tube feeds transiently on hold for concerns of aspiration.   Objective:   BP 140/82   Pulse 80   Temp 98.4 F (36.9 C) (Oral)   Resp 20   Ht 5' 6" (1.676 m)   Wt 74.4 kg   SpO2 100%   BMI 26.47 kg/m   Intake/Output Summary (Last 24 hours) at 08/28/2020 0636 Last data filed at 08/28/2020 3474 Gross per 24 hour  Intake 600.78 ml  Output 100 ml  Net 500.78 ml   Weight change:   Physical Exam: Gen: Resting comfortably, opens eyes with touch/calling out his name CVS: Pulse regular rhythm, normal rate, S1 and S2 normal Resp: Clear to auscultation bilaterally, on ventilator via tracheostomy.  Right IJ TDC Abd: Soft, PEG tube in situ-tube feeds on hold, left lower quadrant ostomy with intact bag.  Ext: Trace lower  extremity/upper extremity edema, functionally quadriplegic  Imaging: DG CHEST PORT 1 VIEW  Result Date: 08/27/2020 CLINICAL DATA:  Respiratory failure EXAM: PORTABLE CHEST - 1 VIEW COMPARISON:  the previous day's study FINDINGS: Tunneled right IJ HD catheter to the proximal right atrium. Tracheostomy device in stable position. Patchy airspace opacities in the lung bases right greater than left, slightly improved on the left since previous. Heart size and mediastinal contours are within normal limits. Blunting of costophrenic angle suggesting small layering pleural effusions. No pneumothorax. Anterior vertebral endplate spurring at multiple levels in the mid and lower thoracic spine. IMPRESSION: Asymmetric edema or infiltrates, slightly improved on the left since previous. Support hardware stable. Electronically Signed   By: Lucrezia Europe M.D.   On: 08/27/2020 07:35   DG CHEST PORT 1 VIEW  Result Date: 08/26/2020 CLINICAL DATA:  Follow-up bibasilar infiltrates EXAM: PORTABLE CHEST 1 VIEW COMPARISON:  08/09/2020 FINDINGS: Cardiac shadow is stable. Dialysis catheter is again seen. Persistent and slightly worsened bibasilar infiltrates are seen. No pneumothorax is seen. No bony abnormality is noted. IMPRESSION: Slight increase in bibasilar infiltrates when compared with the prior study. Electronically Signed   By: Inez Catalina M.D.   On: 08/26/2020 17:10   DG Abd Portable 1V  Result Date: 08/27/2020 CLINICAL DATA:  Feeding tube malfunction. EXAM: PORTABLE ABDOMEN - 1 VIEW COMPARISON:  Chest radiograph, 08/27/2020. CT abdomen pelvis, 07/26/2020. FINDINGS: Suboptimal evaluation, without enteral contrast opacification via the gastrostomy tube. Gastrostomy tube projecting at the expected location, at the epigastrium. The imaged bowel is not obstructed. Central venous catheter and right ureteral stent, incompletely imaged.  No acute osseous abnormality. IMPRESSION: Suboptimal evaluation, without enteral contrast  opacification via the gastrostomy tube, within this constraint; Gastrostomy tube projecting at the expected location Electronically Signed   By: Jon  Mugweru M.D.   On: 08/27/2020 09:14    Labs: BMET Recent Labs  Lab 08/22/20 0217 08/23/20 0303 08/23/20 1157 08/24/20 0446 08/25/20 1348 08/26/20 0314 08/26/20 0925 08/27/20 0742 08/28/20 0510  NA 131* 133* 133* 130* 130* 129* 132* 132* 131*  K 4.9 3.5 4.8 4.9 5.3* 4.8 3.1* 4.1 4.0  CL 91* 95*  --  92* 92* 90* 95* 94* 93*  CO2 20* 22  --  21* 21* 23 24 25 24  GLUCOSE 152* 254*  --  138* 169* 122* 178* 76 164*  BUN 150* 69*  --  102* 135* 145* 46* 70* 86*  CREATININE 1.68* 1.09  --  1.53* 2.02* 2.15* 1.06 1.42* 1.69*  CALCIUM 9.3 8.9  --  9.2 9.2 9.2 8.9 8.9 9.1  PHOS 5.4* 3.2  --  3.9 4.9* 4.3  --   --   --    CBC Recent Labs  Lab 08/22/20 0217 08/23/20 1157 08/26/20 0314 08/27/20 0742 08/28/20 0510  WBC 13.3*  --  28.8* 28.7* 26.9*  NEUTROABS  --   --   --  22.9* 21.8*  HGB 11.3* 13.3 9.8* 9.3* 9.3*  HCT 34.7* 39.0 30.8* 28.2* 28.8*  MCV 82.0  --  81.9 81.3 81.4  PLT 358  --  517* 489* 488*    Medications:     amiodarone  200 mg Per Tube Daily   apixaban  5 mg Per Tube BID   buPROPion  75 mg Per Tube BID   chlorhexidine gluconate (MEDLINE KIT)  15 mL Mouth Rinse BID   Chlorhexidine Gluconate Cloth  6 each Topical Q0600   darbepoetin (ARANESP) injection - NON-DIALYSIS  40 mcg Subcutaneous Q Sun-1800   guaiFENesin  5 mL Per Tube BID   insulin aspart  0-9 Units Subcutaneous Q4H   insulin glargine-yfgn  20 Units Subcutaneous Daily   mouth rinse  15 mL Mouth Rinse 10 times per day   midodrine  10 mg Per Tube Q8H   multivitamin  1 tablet Per Tube QHS   pantoprazole sodium  40 mg Per Tube QHS   sertraline  100 mg Per Tube Daily   sodium chloride flush  10-40 mL Intracatheter Q12H   Jay Patel, MD 08/28/2020, 6:36 AM   

## 2020-08-28 NOTE — Progress Notes (Signed)
Patient ID: Luis Orr, male   DOB: February 11, 1950, 70 y.o.   MRN: [id number]  Renal update note:  [name] was scheduled for hemodialysis today to be performed bedside here in the ICU.  Unfortunately, there is a hemodialysis nurse staffing shortage and his dialysis may not be done until tomorrow.  I have discussed with the charge nurse in the medical ICU to see if they have sufficient staffing to be able to accommodate CRRT overnight.  CRRT is being performed exclusively for delivery of dialysis tonight and not because of any hemodynamic instability.  It is entirely out of logistical reasons that this modality is being used to deliver renal replacement therapy overnight.  Performance of CRRT overnight is not an indicator of any clinical compromise or setback and should not be a bearing on his placement.  Elmarie Shiley MD [facility]. Office # [phone] Pager # [phone] 5:07 PM

## 2020-08-28 NOTE — Progress Notes (Addendum)
[facility] PROGRESS NOTE  Luis Orr TIR:[id number] DOB: 15-Oct-1950 DOA: 06/15/2020 PCP: Townsend Roger, MD  Status: Remains inpatient appropriate because:Hemodynamically unstable, Persistent severe electrolyte disturbances, Unsafe d/c plan, and Inpatient level of care appropriate due to severity of illness  Dispo: The patient is from:  [facility]              Anticipated d/c is to: SNF Vent capable w/ access to HD              Patient currently is not medically stable to d/c.   Difficult to place patient Yes    Level of care: ICU  Code Status: Partial (no CPR and no defibrillation or cardioversion) Family Communication:  DVT prophylaxis: Eliquis COVID vaccination status: Unknown    HPI:  70 y.o. 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 Patient presented secondary to altered mental status and hypotension with recurrent pneumonia requiring vasopressor support and ICU admission.he has had multiple recent admissions for sepsis secondary to various infections including UTIs, pneumonias and bacteremia in setting of MDR Klebsiella, MRSA and pseudomonas. Most recent admission September 2021 in setting of sepsis due to MDR Klebsiella with right ureteral calculus s/p double J ureteral stent placement and treated with meropenem x 10days. He was discharged back to [facility].  ICU significant events: 6/29 admitted to ICU for septic shock on levo to maintain MAP>65, continued on vent support 6/29 Blood Cx>> staph species in 1 of 4 cultures drawn>> suspect contaminant 6/30 Off pressors 7/2 ID Consult for MDR acinetobacter in trach asp, Meropenem changed to unasyn 7/2 PRBC transfusion, iron replacement 7/5 Pressors added again. Central line placed. Drainage from gastrostomy tube >> CT Abd without fluid collection around gastrostomy tube but with diffuse body wall edema. Echo EF 50-55%, mild LVH 7/6 Renal  Replacement Therapy ; Code status changed to partial Code (DNR) 7/7 Transfuse 1U PRBC 7/9 add solu cortef 7/10 off pressors, weaning stress steroids 7/11 Pressors added back on again 7/12 ID consult due to rising WBC, hypothermia, hypotension concern for worsening infection. Vancomycin added. Repeat blood cx collected. 7/15 CRRT stopped 7/17 Restarted on low dose levo. No evidence of new infection. Held off on antibiotics. Nephrology contacted family and decision was made to continue iHD this week to allow additional time for renal recovery. Patient is not the best long term iHD candidate but family wants aggressive care 7/18 plans for iHD, tolerated with 2L removed 719 slight drop in hgb to 6.9 will transfuse 1 unit PRBC 7/22 > 7/26: Tolerating HD. TOC consulted for placement. Will likely need out of state placement. 8/15 WBCs greater than 28,000, hypoglycemia, procalcitonin >18, chest x-ray consistent with pneumonia, recent blood cultures consistent with contamination.  Unasyn IV resumed after sputum culture obtained 8/16 consultation placed with ethics committee regarding evaluation for futility of care   Subjective: Sleeping soundly and did not awaken.  Although he has been nonverbal with me other physicians and nursing staff states that once he becomes comfortable with you as a provider he will begin to open up and attempt to communicate.  Objective: Vitals:   08/28/20 0754 08/28/20 0800  BP:  (!) 142/86  Pulse:    Resp:  20  Temp: 98.5 F (36.9 C)   SpO2:      Intake/Output Summary (Last 24 hours) at 08/28/2020 0811 Last data filed at 08/28/2020 0612 Gross per 24 hour  Intake 433.58 ml  Output 100 ml  Net 333.58 ml   Filed Weights   08/24/20 0312 08/25/20 0355 08/26/20 0300  Weight: 75.2 kg 74.8 kg 74.4 kg    Exam:  Constitutional: NAD, not agitated Respiratory: Trach: 6.0 XLT cuffed, Vent settings: PRVC mode, Fio2 40%, rate 20, VT 510, PEEP 5, lungs are coarse  bilaterally with some expiratory rhonchi in the mid fields down greater on the right.Marland Kitchen Respiratory effort per vent (diaphragm paralyzed).  No increased work of breathing Cardiovascular: SR, no murmurs / rubs / gallops. No extremity edema except for focal bilateral hand edema that appears to be dependent in nature. 1+ pedal pulses. No JVD Abdomen: no tenderness, no masses palpated. Bowel sounds positive. 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  Recurrent ventilator associated pneumonia /sepsis without shock WBC has decreased from 28 to 26K Procalcitonin decreased from 18 to 14 and CRP has decreased from 28 to 23 Lactic acid normal  Blood cultures with contaminant 8/16 sputum culture with abundant GNRs consistent with Acinetobacter Baumanii with sensitivities pending Continue empiric Unasyn to cover recurrent Acinetobacter pulmonary infection Presented initially with ventilator associated pneumonia secondary to Acinetobacter and completed 14 days of Unasyn   AKI with oliguria/volume overload/ New CKD requiring HD Anuric Nephrology following, initially required CRRT and has transition to HD Palliative care has been consulted and are following.  TDC placed on 7/26-family wants aggressive measures so anticipate will need permanent access -Nephrology recommendations: Continuing HD   Septic shock 2/2 VAP Resolved Presented with sepsis physiology and profound shock which has now resolved Now has reemergence of sepsis (see above)  History of paroxysmal atrial fibrillation Patient was on amiodarone  prior to admission  Currently maintaining sinus rhythm TSH was 0.922 on 07/15/2200   Acute metabolic encephalopathy Resolved Secondary to acute illness. CT head negative for acute process.  Reportedly back at baseline.   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   Shock liver Secondary to septic shock. Improving. 7/20 last labs that consisted of hepatic function studies demonstrated an AST of 60 and an ALT of 69 with normal bilirubin-obtain hepatic function panel today on 8/10   Quadriplegia secondary to compressive myelopathy.  Previously had [facility] but with poor performance scores as well as poor rehabilitation potential [facility] will not accept him back if he is HD dependent since he must be able to sit up in a chair for dialysis.   Microcytic anemia/Acute anemia Patient required 4 units of PRBC to date. Thought to be secondary to a combination of acute illness, chronic disease and iron deficiency. IV iron given. Stable.   Severe protein calorie malnutrition Nutrition Problem: Increased nutrient needs Etiology: wound healing Signs/Symptoms: estimated needs Interventions: Tube feeding, Prostat, MVI Estimated body mass index is 26.47 kg/m as calculated from the following:   Height as of this encounter: _0  (1.676 m).   Weight as of this encounter: 74.4 kg.   Pressure injury Stage IV medial/posterior sacrum  present on admission.  Right/posterior/lateral ankle unknown if present on admission.  DTI left ear, not present on admission. (4 additional documentation see wound care notes)    Data Reviewed: Basic Metabolic Panel: Recent Labs  Lab 08/22/20 0217 08/23/20 0303  08/23/20 1157 08/24/20 0446 08/25/20 1348 08/26/20 0314 08/26/20 0925 08/27/20 0742 08/28/20 0510  NA 131* 133*   < > 130* 130* 129* 132* 132* 131*  K 4.9 3.5   < > 4.9 5.3* 4.8 3.1* 4.1 4.0  CL 91* 95*  --  92* 92* 90* 95* 94* 93*  CO2 20* 22  --  21* 21* _0 GLUCOSE 152* 254*  --  138* 169* 122* 178* 76 164*  BUN 150* 69*  --  102* 135* 145* 46* 70* 86*  CREATININE 1.68* 1.09  --  1.53* 2.02* 2.15* 1.06 1.42* 1.69*  CALCIUM 9.3 8.9  --  9.2 9.2 9.2 8.9 8.9 9.1  MG  --   --   --   --   --   --   --  2.1  --   PHOS 5.4* 3.2  --  3.9 4.9* 4.3  --   --   --    < > = values in this interval not displayed.   Liver Function Tests: Recent Labs  Lab 08/22/20 0217 08/23/20 0303 08/24/20 0446 08/25/20 1348 08/26/20 0314 08/27/20 0742 08/28/20 0510  AST 46*  --   --   --   --  53* 47*  ALT 32  --   --   --   --  37 34  ALKPHOS 248*  --   --   --   --  333* 322*  BILITOT 1.1  --   --   --   --  1.4* 0.9  PROT 8.1  --   --   --   --  6.6 6.6  ALBUMIN 2.5*  2.6*   < > 2.2* 2.0* 1.9* 1.7* 1.7*   < > = values in this interval not displayed.   No results for input(s): LIPASE, AMYLASE in the last 168 hours. No results for input(s): AMMONIA in the last 168 hours. CBC: Recent Labs  Lab 08/22/20 0217 08/23/20 1157 08/26/20 0314 08/27/20 0742 08/28/20 0510  WBC 13.3*  --  28.8* 28.7* 26.9*  NEUTROABS  --   --   --  22.9* 21.8*  HGB 11.3* 13.3 9.8* 9.3* 9.3*  HCT 34.7* 39.0 30.8* 28.2* 28.8*  MCV 82.0  --  81.9 81.3 81.4  PLT 358  --  517* 489* 488*   Cardiac Enzymes: No results for input(s): CKTOTAL, CKMB, CKMBINDEX, TROPONINI in the last 168 hours. BNP (last 3 results) Recent Labs    06/18/2020 1922  BNP 135.0*    ProBNP (last 3 results) No results for input(s): PROBNP in the last 8760 hours.  CBG: Recent Labs  Lab 08/27/20 1536 08/27/20 2107 08/28/20 0004 08/28/20 0303 08/28/20 0753  GLUCAP 95 118* 127* 150* 145*       Studies: DG CHEST PORT 1  VIEW  Result Date: 08/27/2020 CLINICAL DATA:  Respiratory failure EXAM: PORTABLE CHEST - 1 VIEW COMPARISON:  the previous day's study FINDINGS: Tunneled right IJ HD catheter to the proximal right atrium. Tracheostomy device in stable position. Patchy airspace opacities in the lung bases right greater than left, slightly improved on the left since previous. Heart size and mediastinal contours are within normal limits. Blunting of costophrenic angle suggesting small layering  pleural effusions. No pneumothorax. Anterior vertebral endplate spurring at multiple levels in the mid and lower thoracic spine. IMPRESSION: Asymmetric edema or infiltrates, slightly improved on the left since previous. Support hardware stable. Electronically Signed   By: Lucrezia Europe M.D.   On: 08/27/2020 07:35   DG CHEST PORT 1 VIEW  Result Date: 08/26/2020 CLINICAL DATA:  Follow-up bibasilar infiltrates EXAM: PORTABLE CHEST 1 VIEW COMPARISON:  08/09/2020 FINDINGS: Cardiac shadow is stable. Dialysis catheter is again seen. Persistent and slightly worsened bibasilar infiltrates are seen. No pneumothorax is seen. No bony abnormality is noted. IMPRESSION: Slight increase in bibasilar infiltrates when compared with the prior study. Electronically Signed   By: Inez Catalina M.D.   On: 08/26/2020 17:10   DG Abd Portable 1V  Result Date: 08/27/2020 CLINICAL DATA:  Feeding tube malfunction. EXAM: PORTABLE ABDOMEN - 1 VIEW COMPARISON:  Chest radiograph, 08/27/2020. CT abdomen pelvis, 07/26/2020. FINDINGS: Suboptimal evaluation, without enteral contrast opacification via the gastrostomy tube. Gastrostomy tube projecting at the expected location, at the epigastrium. The imaged bowel is not obstructed. Central venous catheter and right ureteral stent, incompletely imaged. No acute osseous abnormality. IMPRESSION: Suboptimal evaluation, without enteral contrast opacification via the gastrostomy tube, within this constraint; Gastrostomy tube projecting  at the expected location Electronically Signed   By: Michaelle Birks M.D.   On: 08/27/2020 09:14    Scheduled Meds:  amiodarone  200 mg Per Tube Daily   apixaban  5 mg Per Tube BID   buPROPion  75 mg Per Tube BID   chlorhexidine gluconate (MEDLINE KIT)  15 mL Mouth Rinse BID   Chlorhexidine Gluconate Cloth  6 each Topical Q0600   darbepoetin (ARANESP) injection - NON-DIALYSIS  40 mcg Subcutaneous Q Sun-1800   guaiFENesin  5 mL Per Tube BID   insulin aspart  0-9 Units Subcutaneous Q4H   insulin glargine-yfgn  20 Units Subcutaneous Daily   mouth rinse  15 mL Mouth Rinse 10 times per day   midodrine  10 mg Per Tube Q8H   multivitamin  1 tablet Per Tube QHS   pantoprazole sodium  40 mg Per Tube QHS   sertraline  100 mg Per Tube Daily   sodium chloride flush  10-40 mL Intracatheter Q12H   Continuous Infusions:  sodium chloride Stopped (07/26/20 0824)   sodium chloride 250 mL (07/31/20 1428)   ampicillin-sulbactam (UNASYN) IV Stopped (08/27/20 1404)   feeding supplement (NEPRO CARB STEADY) 1,000 mL (08/27/20 1334)    Active Problems:   Septic shock (HCC)   Pressure injury of skin   Acute respiratory failure with hypoxia (HCC)   AKI (acute kidney injury) ([place])   Hypotension   Goals of care, counseling/discussion   Quadriplegia ([place])   Chronic kidney disease requiring chronic dialysis ([place])   Hyperglycemia   Sepsis due to Acinetobacter baumannii ([place])   Pneumonia due to Acinetobacter species [facility])   Consultants: PCCM Nephrology Infectious disease Palliative care medicine  Procedures: Echocardiogram Insertion of double-lumen HD catheter  Antibiotics: Cefepime x1 dose Meropenem 6/29 through 7/2 Vancomycin 6/29 through 6/30 Unasyn 7/2 through 7/15 Unasyn 8/15   Time spent: 35 minutes    Erin Hearing ANP  [facility] 7 am - 330 pm/M-F for direct patient care and secure chat Please refer to Amion for contact info 48  days

## 2020-08-28 NOTE — Procedures (Signed)
Interventional Radiology Procedure Note  Procedure: Left IJ tunneled central venous catheter placement.  Indication: Sepsis.  Pneumonia.  Findings:  Tunneled left IJ CVC is ready for use. Please refer to procedural dictation for full description.  Complications: None  EBL: < 10 mL  Miachel Roux, MD [phone]

## 2020-08-28 NOTE — Progress Notes (Signed)
[facility] PROGRESS NOTE  Patient: Luis Orr [id number]   PCP: Townsend Roger, MD DOB: 1950-05-01   DOA: 07/03/2020   DOS: 08/28/2020    Subjective: No acute events overnight.  Secretions improving.  Still has yellowish discoloration with mucus secretions.  Objective:  Vitals:   08/28/20 1704 08/28/20 1800  BP:  126/80  Pulse:  78  Resp: 20 20  Temp:    SpO2:  100%    S1-S2 present. Bilateral crackles present. Bowel sounds present but Generalized edema still present. Poor IV access currently using CRRT catheter.  Assessment and plan: Sepsis. Likely from aspiration. History of Acinetobacter. On IV Unasyn. WBC improving procalcitonin improving. Anticipate improvement in mentation with that as well.  Diarrhea. Currently resolved. Monitor.  Access issue. Currently using CRRT catheter for IV medications and antibiotics. IR requested for central line placement. Appreciate assistance.  ESRD on HD. Patient will be receiving CRRT overnight to remove volume.  Author: Berle Mull, MD [facility] 08/28/2020 6:49 PM   If 7PM-7AM, please contact night-coverage at [email or web address]

## 2020-08-28 NOTE — Progress Notes (Signed)
CSW spoke with Anderson Malta with [facility] who reviewed patient's clinical information again - due to patient being unable to sit up for HD sessions.  Madilyn Fireman, MSW, LCSW Transitions of Care  Clinical Social Worker II [phone]

## 2020-08-29 DIAGNOSIS — A4159 Other Gram-negative sepsis: Secondary | ICD-10-CM | POA: Diagnosis not present

## 2020-08-29 DIAGNOSIS — A419 Sepsis, unspecified organism: Secondary | ICD-10-CM | POA: Diagnosis not present

## 2020-08-29 DIAGNOSIS — G8250 Quadriplegia, unspecified: Secondary | ICD-10-CM | POA: Diagnosis not present

## 2020-08-29 DIAGNOSIS — J9621 Acute and chronic respiratory failure with hypoxia: Secondary | ICD-10-CM

## 2020-08-29 DIAGNOSIS — J158 Pneumonia due to other specified bacteria: Secondary | ICD-10-CM | POA: Diagnosis not present

## 2020-08-29 LAB — CBC WITH DIFFERENTIAL/PLATELET
Abs Immature Granulocytes: 1.4 10*3/uL — ABNORMAL HIGH (ref 0.00–0.07)
Basophils Absolute: 0.2 10*3/uL — ABNORMAL HIGH (ref 0.0–0.1)
Basophils Relative: 1 %
Eosinophils Absolute: 0.2 10*3/uL (ref 0.0–0.5)
Eosinophils Relative: 1 %
HCT: 32.2 % — ABNORMAL LOW (ref 39.0–52.0)
Hemoglobin: 10.3 g/dL — ABNORMAL LOW (ref 13.0–17.0)
Immature Granulocytes: 5 %
Lymphocytes Relative: 4 %
Lymphs Abs: 1 10*3/uL (ref 0.7–4.0)
MCH: 26.2 pg (ref 26.0–34.0)
MCHC: 32 g/dL (ref 30.0–36.0)
MCV: 81.9 fL (ref 80.0–100.0)
Monocytes Absolute: 1.8 10*3/uL — ABNORMAL HIGH (ref 0.1–1.0)
Monocytes Relative: 6 %
Neutro Abs: 24.2 10*3/uL — ABNORMAL HIGH (ref 1.7–7.7)
Neutrophils Relative %: 83 %
Platelets: 504 10*3/uL — ABNORMAL HIGH (ref 150–400)
RBC: 3.93 MIL/uL — ABNORMAL LOW (ref 4.22–5.81)
RDW: 20.3 % — ABNORMAL HIGH (ref 11.5–15.5)
WBC: 28.8 10*3/uL — ABNORMAL HIGH (ref 4.0–10.5)
nRBC: 0.1 % (ref 0.0–0.2)

## 2020-08-29 LAB — GLUCOSE, CAPILLARY
Glucose-Capillary: 185 mg/dL — ABNORMAL HIGH (ref 70–99)
Glucose-Capillary: 210 mg/dL — ABNORMAL HIGH (ref 70–99)
Glucose-Capillary: 218 mg/dL — ABNORMAL HIGH (ref 70–99)
Glucose-Capillary: 175 mg/dL — ABNORMAL HIGH (ref 70–99)
Glucose-Capillary: 125 mg/dL — ABNORMAL HIGH (ref 70–99)
Glucose-Capillary: 137 mg/dL — ABNORMAL HIGH (ref 70–99)

## 2020-08-29 LAB — COMPREHENSIVE METABOLIC PANEL
ALT: 36 U/L (ref 0–44)
AST: 50 U/L — ABNORMAL HIGH (ref 15–41)
Albumin: 1.9 g/dL — ABNORMAL LOW (ref 3.5–5.0)
Alkaline Phosphatase: 344 U/L — ABNORMAL HIGH (ref 38–126)
Anion gap: 10 (ref 5–15)
BUN: 53 mg/dL — ABNORMAL HIGH (ref 8–23)
CO2: 26 mmol/L (ref 22–32)
Calcium: 9.1 mg/dL (ref 8.9–10.3)
Chloride: 96 mmol/L — ABNORMAL LOW (ref 98–111)
Creatinine, Ser: 1.02 mg/dL (ref 0.61–1.24)
GFR, Estimated: >60 mL/min (ref >60)
Glucose, Bld: 195 mg/dL — ABNORMAL HIGH (ref 70–99)
Potassium: 3.6 mmol/L (ref 3.5–5.1)
Sodium: 132 mmol/L — ABNORMAL LOW (ref 135–145)
Total Bilirubin: 0.8 mg/dL (ref 0.3–1.2)
Total Protein: 7.2 g/dL (ref 6.5–8.1)

## 2020-08-29 LAB — CULTURE, BLOOD (ROUTINE X 2): Special Requests: ADEQUATE

## 2020-08-29 LAB — C-REACTIVE PROTEIN: CRP: 25.4 mg/dL — ABNORMAL HIGH (ref <1.0)

## 2020-08-29 LAB — PROCALCITONIN: Procalcitonin: 5.54 ng/mL

## 2020-08-29 MED ORDER — AMPICILLIN-SULBACTAM SODIUM 3 (2-1) G IJ SOLR
3.0000 g | Freq: Two times a day (BID) | INTRAMUSCULAR | Status: AC
  Administered 2020-08-29 – 2020-09-03 (×10): 3 g via INTRAVENOUS
  Filled 2020-08-29 (×10): qty 8

## 2020-08-29 MED ORDER — SODIUM CHLORIDE 0.9 % IV SOLN
3.0000 g | INTRAVENOUS | Status: DC
  Administered 2020-08-29: 3 g via INTRAVENOUS
  Filled 2020-08-29: qty 8

## 2020-08-29 NOTE — Progress Notes (Signed)
Pharmacy Antibiotic Note  Luis Orr is a 70 y.o. male admitted on 07/06/2020 with concern for worsening septic shock/AMS. PMH significant for quadriplegia, chronic trach/vent, ESRD now iHD.  Patient is from [place] and has a history of multi-drug resistant organisms such as ESBL.  Notably, this admission patient completed a course of unasyn for Acinetobacter baumanii which was resistant to everything other than unasyn. Pharmacy consulted 8/15 for restart of unasyn d/t worsening leukocytosis. 8/17 TA shows re-emergence of abundant Acinetobacter baumanii requiring dose-adjustment of unasyn.    Plan: Change dosing interval to Unasyn 3g IV Q12h  F/u sensitivities  Height: '5\' 6"'$  (167.6 cm) Weight: 78.7 kg (173 lb 8 oz) IBW/kg (Calculated) : 63.8  Temp (24hrs), Avg:97.9 F (36.6 C), Min:96.8 F (36 C), Max:98.7 F (37.1 C)  Recent Labs  Lab 08/26/20 0314 08/26/20 0925 08/27/20 0742 08/27/20 1211 08/28/20 0510 08/29/20 0308  WBC 28.8*  --  28.7*  --  26.9* 28.8*  CREATININE 2.15* 1.06 1.42*  --  1.69* 1.02  LATICACIDVEN  --   --   --  1.7  --   --      Estimated Creatinine Clearance: 66.5 mL/min (by C-G formula based on SCr of 1.02 mg/dL).    Allergies  Allergen Reactions   Adhesive [Tape] Rash    Antimicrobials this admission: Cefepime 6/29 x1 Merrem 6/30>>7/2 Vancomycin 6/29>>7/1, 7/12>>7/14 Unasyn 7/2>>7/15 Unasyn 8/15>>   Microbiology results: 8/15 Sputum: Abundant Acinetobacter baumanii  8/14 Bcx: 1/3 staph epi  7/12 BCx: negative  7/1 UCx: multiple species   6/30 Sputum: acinetobacter baumannii  6/29 MRSA PCR: positive  Thank you for allowing pharmacy to be a part of this patient's care.  Adria Dill, PharmD PGY-1 Acute Care Resident  08/29/2020 1:21 PM

## 2020-08-29 NOTE — Progress Notes (Signed)
[facility] PROGRESS NOTE  Luis Orr UMP:[id number] DOB: 1950-03-27 DOA: 06/19/2020 PCP: Townsend Roger, MD  Status: Remains inpatient appropriate because:Hemodynamically unstable, Persistent severe electrolyte disturbances, Unsafe d/c plan, and Inpatient level of care appropriate due to severity of illness  Dispo: The patient is from:  [facility]              Anticipated d/c is to: SNF Vent capable w/ access to HD              Patient currently is not medically stable to d/c.   Difficult to place patient Yes    Level of care: ICU  Code Status: Partial (no CPR and no defibrillation or cardioversion) Family Communication: Wife June on 8/17.  Of note the wife is also the POA DVT prophylaxis: Eliquis COVID vaccination status: Unknown    HPI:  70 y.o. 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 Patient presented secondary to altered mental status and hypotension with recurrent pneumonia requiring vasopressor support and ICU admission.he has had multiple recent admissions for sepsis secondary to various infections including UTIs, pneumonias and bacteremia in setting of MDR Klebsiella, MRSA and pseudomonas. Most recent admission September 2021 in setting of sepsis due to MDR Klebsiella with right ureteral calculus s/p double J ureteral stent placement and treated with meropenem x 10days. He was discharged back to [facility].  ICU significant events: 6/29 admitted to ICU for septic shock on levo to maintain MAP>65, continued on vent support 6/29 Blood Cx>> staph species in 1 of 4 cultures drawn>> suspect contaminant 6/30 Off pressors 7/2 ID Consult for MDR acinetobacter in trach asp, Meropenem changed to unasyn 7/2 PRBC transfusion, iron replacement 7/5 Pressors added again. Central line placed. Drainage from gastrostomy tube >> CT Abd without fluid collection around gastrostomy tube but with diffuse body wall  edema. Echo EF 50-55%, mild LVH 7/6 Renal Replacement Therapy ; Code status changed to partial Code (DNR) 7/7 Transfuse 1U PRBC 7/9 add solu cortef 7/10 off pressors, weaning stress steroids 7/11 Pressors added back on again 7/12 ID consult due to rising WBC, hypothermia, hypotension concern for worsening infection. Vancomycin added. Repeat blood cx collected. 7/15 CRRT stopped 7/17 Restarted on low dose levo. No evidence of new infection. Held off on antibiotics. Nephrology contacted family and decision was made to continue iHD this week to allow additional time for renal recovery. Patient is not the best long term iHD candidate but family wants aggressive care 7/18 plans for iHD, tolerated with 2L removed 719 slight drop in hgb to 6.9 will transfuse 1 unit PRBC 7/22 > 7/26: Tolerating HD. TOC consulted for placement. Will likely need out of state placement. 8/15 WBCs greater than 28,000, hypoglycemia, procalcitonin >18, chest x-ray consistent with pneumonia, recent blood cultures consistent with contamination.  Unasyn IV resumed after sputum culture obtained 8/16 consultation placed with ethics committee regarding evaluation for futility of care 8/16 Sputum cx + for Acinetobacter with sensitivities pending 8/17 extensive conversation held with patient's wife regarding patient's poor prognosis and reemergence of Acinetobacter VAP.  Also discussed with her that patient has been able to convey to multiple staff that he does not want to continue on the ventilator or with dialysis.  Wife reports that the patient does not understand what he is saying, she is the POA and that she wants to continue aggressive care as he is   Subjective: Awake and nodding appropriately to questions when asked.  I  approached the subject of life care and point-blank asked the patient if he wanted to live out his life on the ventilator receiving dialysis.  He frowned and began to cry and shook his head  no  Objective: Vitals:   08/29/20 0700 08/29/20 0736  BP: 96/69   Pulse: 73   Resp: 20   Temp:    SpO2: 100% 100%    Intake/Output Summary (Last 24 hours) at 08/29/2020 0743 Last data filed at 08/29/2020 0700 Gross per 24 hour  Intake 1500 ml  Output 3406 ml  Net -1906 ml   Filed Weights   08/25/20 0355 08/26/20 0300 08/29/20 0500  Weight: 74.8 kg 74.4 kg 78.7 kg    Exam:  Constitutional: NAD, awake today and attempting to communicate by nodding head. Respiratory: Trach: 6.0 XLT cuffed, Vent settings: PRVC mode, Fio2 40%, rate 20, VT 510, PEEP 5, lung sounds remain coarse with expiratory rhonchi primarily in the mid and lower fields.  Respiratory effort per vent (diaphragm paralyzed).   Cardiovascular: SR, no murmurs / rubs / gallops. No extremity edema except for focal dependent bilateral hand edema, 1+ pedal pulses. No JVD Abdomen: no tenderness, no masses palpated. Bowel sounds positive. LLQ colostomy, PEG tube for feeds, LBM 8/17 Neurologic: CN 2-12 grossly intact on visual inspection- pt sleeping-patient with quadriplegia so unable to independently move extremities.  Noted to not have any sensation when tested Psychiatric: Awake and nods head appropriately to questions asked.  Unable to accurately assess orientation though his inability to phonate.   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  Recurrent ventilator associated pneumonia 2/2 recurrent Acinetobacter/sepsis without shock WBC trend: 28.7 > 26.9 > 28.8 Procalcitonin trend: 18.73 > 14.42 > 5.54 CRP trend: 28.4 > 25.5 > 25.4 Lactic acid normal  Blood cultures with contaminant 8/16 sputum culture with abundant GNRs c/w Acinetobacter Baumanii with  sensitivities pending Continue empiric Unasyn to cover recurrent Acinetobacter pulmonary infection _0  initially with ventilator associated pneumonia secondary to Acinetobacter and completed 14 days of Unasyn@   AKI with oliguria/volume overload/ New CKD requiring HD Anuric Nephrology following, initially required CRRT and has transition to HD Palliative care has been consulted and are following.  TDC placed on 7/26-family wants aggressive measures so anticipate will need permanent access -Nephrology recommendations: Continuing HD   Septic shock 2/2 VAP Resolved Presented with sepsis physiology and profound shock which has now resolved Now has reemergence of sepsis (see above) without shock  History of paroxysmal atrial fibrillation Patient was on amiodarone prior to admission  Currently maintaining sinus rhythm TSH was 0.922 on 07/16/2018 QTc 420 ms on 1/89   Acute metabolic encephalopathy Resolved Secondary to acute illness. CT head negative for acute process.  Reportedly back at baseline.   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 Improving. 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  Previously resided at [facility] but with poor performance scores as well as poor rehabilitation potential [facility] will not accept him back if he is HD dependent (he must be able to sit up in a chair for dialysis.)   Microcytic anemia/Acute anemia Patient required 4 units of PRBC to date.  Thought to be secondary to a combination of acute illness, chronic disease and iron deficiency. IV iron given.  Hemoglobin on 8/17 was 10.3   Severe protein calorie malnutrition Nutrition Problem: Increased nutrient needs Etiology: wound healing Signs/Symptoms: estimated needs Interventions: Tube feeding, Prostat, MVI Estimated body mass index is 28 kg/m as calculated from the following:   Height as of this encounter: _0  (1.676 m).   Weight as of this encounter: 78.7 kg.   Pressure injury Stage IV medial/posterior sacrum present on admission.  Right/posterior/lateral ankle unknown if present on admission.  DTI left ear, not present on admission. (For additional documentation see wound care notes)    Data Reviewed: Basic Metabolic Panel: Recent Labs  Lab 08/23/20 0303 08/23/20 1157 08/24/20 0446 08/25/20 1348 08/26/20 0314 08/26/20 0925 08/27/20 0742 08/28/20 0510 08/29/20 0308  NA 133*   < > 130* 130* 129* 132* 132* 131* 132*  K 3.5   < > 4.9 5.3* 4.8 3.1* 4.1 4.0 3.6  CL 95*  --  92* 92* 90* 95* 94* 93* 96*  CO2 22  --  21* 21* _1 GLUCOSE 254*  --  138* 169* 122* 178* 76 164* 195*  BUN 69*  --  102* 135* 145* 46* 70* 86* 53*  CREATININE 1.09  --  1.53* 2.02* 2.15* 1.06 1.42* 1.69* 1.02  CALCIUM 8.9  --  9.2 9.2 9.2 8.9 8.9 9.1 9.1  MG  --   --   --   --   --   --  2.1  --   --   PHOS 3.2  --  3.9 4.9* 4.3  --   --   --   --    < > = values in this interval not displayed.   Liver Function Tests: Recent Labs  Lab 08/25/20 1348 08/26/20 0314 08/27/20 0742 08/28/20 0510 08/29/20 0308  AST  --   --  53* 47* 50*  ALT  --   --  37 34 36  ALKPHOS  --   --  333* 322* 344*  BILITOT  --   --  1.4* 0.9 0.8  PROT  --   --  6.6 6.6 7.2  ALBUMIN 2.0* 1.9* 1.7* 1.7* 1.9*   No results for input(s): LIPASE, AMYLASE in the last 168 hours. No results for input(s): AMMONIA in the last 168 hours. CBC: Recent Labs  Lab 08/23/20 1157 08/26/20 0314  08/27/20 0742 08/28/20 0510 08/29/20 0308  WBC  --  28.8* 28.7* 26.9* 28.8*  NEUTROABS  --   --  22.9* 21.8* 24.2*  HGB 13.3 9.8* 9.3* 9.3* 10.3*  HCT 39.0 30.8* 28.2* 28.8* 32.2*  MCV  --  81.9 81.3 81.4 81.9  PLT  --  517* 489* 488* 504*   Cardiac Enzymes: No results for input(s): CKTOTAL, CKMB, CKMBINDEX, TROPONINI in the last 168 hours. BNP (last 3 results) Recent Labs    06/17/2020 1922  BNP 135.0*    ProBNP (last 3 results) No results for input(s): PROBNP in the last 8760 hours.  CBG: Recent Labs  Lab 08/28/20 1125 08/28/20 1541 08/28/20 1958 08/28/20 2307 08/29/20 2820  GLUCAP 162* 108* 141* 145* 185*       Studies: IR Fluoro Guide CV Line Left  Result Date: 08/28/2020 INDICATION: Sepsis Pneumonia EXAM: Fluoroscopy and ultrasound-guided tunneled central venous catheter placement MEDICATIONS: None ANESTHESIA/SEDATION: None FLUOROSCOPY TIME:  Fluoroscopy Time: 1.12 minutes (35 mGy). COMPLICATIONS: None immediate. PROCEDURE: Informed written consent was obtained from the patient after a thorough discussion of the procedural risks, benefits and alternatives. All questions were addressed. Maximal Sterile Barrier Technique was utilized including caps, mask, sterile gowns, sterile gloves, sterile drape, hand hygiene and skin antiseptic. A timeout was performed prior to the initiation of the procedure. Patient positioned supine on the angiography table. Left neck and anterior upper chest prepped and draped in the usual sterile fashion. The left internal jugular vein was evaluated with ultrasound and shown to be patent. A permanent ultrasound image was obtained and placed in the patient's medical record. Using sterile gel and a sterile probe cover, the left internal jugular vein was entered with a 21 ga needle during real time ultrasound guidance. 0.018 inch guidewire advanced to the IVC under fluoroscopic guidance. Peel-away sheath placed. Attention then turned to the left  anterior upper chest. Following local lidocaine administration, the catheter was tunneled from the chest wall to the venotomy site. The catheter was inserted through the peel-away sheath. The tip of the catheter was positioned at the cavoatrial junction. All lumens of the catheter aspirated and flushed well. The catheter was secured to the skin with suture. Skin overlying the right neck venotomy site was sealed with Dermabond. The insertion site was covered with a Biopatch and sterile dressing. IMPRESSION: Left IJ tunneled central venous catheter is ready for use. Electronically Signed   By: Miachel Roux M.D.   On: 08/28/2020 15:18   IR US Guide Vasc Access Left  INDICATION: Sepsis   Pneumonia   EXAM: Fluoroscopy and ultrasound-guided tunneled central venous catheter placement   MEDICATIONS: None   ANESTHESIA/SEDATION: None   FLUOROSCOPY TIME:  Fluoroscopy Time: 1.12 minutes (35 mGy).   COMPLICATIONS: None immediate.   PROCEDURE: Informed written consent was obtained from the patient after a thorough discussion of the procedural risks, benefits and alternatives. All questions were addressed. Maximal Sterile Barrier Technique was utilized including caps, mask, sterile gowns, sterile gloves, sterile drape, hand hygiene and skin antiseptic. A timeout was performed prior to the initiation of the procedure.   Patient positioned supine on the angiography table.   Left neck and anterior upper chest prepped and draped in the usual sterile fashion.   The left internal jugular vein was evaluated with ultrasound and shown to be patent. A permanent ultrasound image was obtained and placed in the patient's medical record. Using sterile gel and a sterile probe cover, the left internal jugular vein was entered with a 21 ga needle during real time ultrasound guidance.   0.018 inch guidewire advanced to the IVC under fluoroscopic guidance. Peel-away sheath placed.   Attention then turned to the left anterior upper chest.  Following local lidocaine administration, the catheter was tunneled from the chest wall to the venotomy site. The catheter was inserted through the peel-away sheath. The tip of the catheter was positioned at the cavoatrial junction.   All lumens of the catheter aspirated and flushed well.   The catheter was secured to the skin with suture. Skin overlying the right neck venotomy site was sealed with Dermabond. The insertion site was covered with a Biopatch and sterile dressing.   IMPRESSION: Left IJ tunneled central  venous catheter is ready for use.     Electronically Signed   By: Miachel Roux M.D.   On: 08/28/2020 15:18    DG Abd Portable 1V  Result Date: 08/27/2020 CLINICAL DATA:  Feeding tube malfunction. EXAM: PORTABLE ABDOMEN - 1 VIEW COMPARISON:  Chest radiograph, 08/27/2020. CT abdomen pelvis, 07/26/2020. FINDINGS: Suboptimal evaluation, without enteral contrast opacification via the gastrostomy tube. Gastrostomy tube projecting at the expected location, at the epigastrium. The imaged bowel is not obstructed. Central venous catheter and right ureteral stent, incompletely imaged. No acute osseous abnormality. IMPRESSION: Suboptimal evaluation, without enteral contrast opacification via the gastrostomy tube, within this constraint; Gastrostomy tube projecting at the expected location Electronically Signed   By: Michaelle Birks M.D.   On: 08/27/2020 09:14    Scheduled Meds:  amiodarone  200 mg Per Tube Daily   apixaban  5 mg Per Tube BID   buPROPion  75 mg Per Tube BID   chlorhexidine gluconate (MEDLINE KIT)  15 mL Mouth Rinse BID   Chlorhexidine Gluconate Cloth  6 each Topical Q0600   darbepoetin (ARANESP) injection - NON-DIALYSIS  40 mcg Subcutaneous Q Sun-1800   guaiFENesin  5 mL Per Tube BID   insulin aspart  0-9 Units Subcutaneous Q4H   insulin glargine-yfgn  20 Units Subcutaneous Daily   mouth rinse  15 mL Mouth Rinse 10 times per day   midodrine  5 mg Per Tube Q8H   multivitamin  1 tablet  Per Tube QHS   pantoprazole sodium  40 mg Per Tube QHS   sertraline  100 mg Per Tube Daily   sodium chloride flush  10-40 mL Intracatheter Q12H   Continuous Infusions:   prismasol BGK 4/2.5 500 mL/hr at 08/29/20 0550    prismasol BGK 4/2.5 500 mL/hr at 08/29/20 0550   sodium chloride Stopped (07/26/20 0824)   sodium chloride 250 mL (08/28/20 2029)   ampicillin-sulbactam (UNASYN) IV Stopped (08/28/20 1455)   feeding supplement (NEPRO CARB STEADY) 1,000 mL (08/28/20 1556)   heparin 10,000 units/ 20 mL infusion syringe 350 Units/hr (08/28/20 1949)   prismasol BGK 4/2.5 1,500 mL/hr at 08/29/20 0550    Active Problems:   Septic shock (HCC)   Pressure injury of skin   Acute respiratory failure with hypoxia (HCC)   AKI (acute kidney injury) ([place])   Hypotension   Goals of care, counseling/discussion   Quadriplegia ([place])   Chronic kidney disease requiring chronic dialysis ([place])   Hyperglycemia   Sepsis due to Acinetobacter baumannii ([place])   Pneumonia due to Acinetobacter species [facility])   Consultants: PCCM Nephrology Infectious disease Palliative care medicine  Procedures: Echocardiogram Insertion of double-lumen HD catheter  Antibiotics: Cefepime x1 dose Meropenem 6/29 through 7/2 Vancomycin 6/29 through 6/30 Unasyn 7/2 through 7/15 Unasyn 8/15 >   Time spent: 35 minutes    Erin Hearing ANP  [facility] 7 am - 330 pm/M-F for direct patient care and secure chat Please refer to Amion for contact info 49  days

## 2020-08-29 NOTE — Ethics Note (Signed)
Ethics Consult Note  Initial contact w/ medical team 08/29/20, which was on patient's hospital day 49   Source of Consult: Erin Hearing, NP  Current attending physician/service: Antonieta Pert, MD  Reason(s) for consult and ethical question(s): Capacity - patient is expressing nonverbal wish to not continue current treatments Surrogate decision-maker concern   Information-gathering: Discussion with source of consult Chart review 08/29/20   Narrative:  Medical facts: unfortunate patient with preexisting quadriplegia and persistent VAP unable to wean from vent  Patient's Personal/Social Facts: patient is alert, he has been asked about long-term ventilator dependence and has non verbally communicated that this is not something he wants to live with. His wife, who has not been at bedside, has stated that the medical team should stop asking this patient about his wishes and only ask her.    With regard to the applicable underlying ethical principles, the standards of ethical care and relevant resources were discussed directly with Erin Hearing, NP, and will discuss as well with attending physician.  Ethics committee strives to ensure that all necessary and appropriate steps are taken, such that all decisions made for this patient are ethically justifiable. Ethics offers the following recommendations:     Recommendations:  1)  If the patient's attending physician deems the patient to have decision-making capacity, then the patient's decisions should be respected and this includes withdrawal/refusal of treatment even if such withdrawal would result in death or harm. Patient's wife has stated that she is POA and can make decision on this patient's behalf, which is ONLY TRUE IF/WHEN a patient lacks capacity. Wife has asked that medical team NOT ask patient directly about his care. She should be educated on the circumstances in which a legal NOK/POA may may decisions on a patient's behalf. So, first  and foremost, the attending physician should assess the patient to determine capacity.   2) If patient has capacity, then his wishes should be respected.  3) If patient doe not have capacity or may not have sufficient capacity, please reach out again to Ethics to go over next steps if the medical team and the patient's wife are still at odds regarding the appropriate decision to make for him.         Thank you for this consult. Ethics will continue to follow this case.   Erin Hearing has my personal cell phone number and has my permission to share this number at their discretion.  Secure message on Epic is also welcome but may not receive an immediate response.  Please reference AMION for on-call committee member if needed.    [place]

## 2020-08-29 NOTE — Progress Notes (Signed)
Patient ID: DREAM NODAL, male   DOB: [birth year], 70 y.o.   MRN: [id number] [facility] Progress Note   Assessment/ Plan:   1. Acute kidney Injury-prolonged dialysis dependency: Previous creatinine of around 0.4 and suffered dense ATN from septic shock-without any evidence of renal recovery to date-likely now end-stage renal disease.  Started CRRT on 7/6 for volume overload/anasarca and uremia and thereafter transitioned to intermittent hemodialysis.  He was supposed to get hemodialysis yesterday however because of hemodialysis nursing staff shortage, was put on CRRT overnight after discussion with ICU nursing staff for their availability (this will be stopped this morning).  I appreciate the MICU nursing staff for their ability to accommodate this modality of treatment. 2.  Septic shock/acute metabolic encephalopathy: Hemodynamically stable off pressors.  Sputum yesterday revealed multiple GNR's and with history of Acinetobacter-restarted back on intravenous Unasyn. 3.  Chronic respiratory failure: Status post tracheostomy with ventilator management per CCM 4.  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  Chronic kidney disease/metabolic bone disease: Calcium and phosphorus level under decent control with hemodialysis, off binders  Subjective:   Without any acute events overnight, able to tolerate CRRT/UF without problems.   Objective:   BP 110/75   Pulse 67   Temp (!) 96.8 F (36 C) (Oral) Comment: Bairhugger applied  Resp (!) 21   Ht _0  (1.676 m)   Wt 78.7 kg   SpO2 99%   BMI 28.00 kg/m   Intake/Output Summary (Last 24 hours) at 08/29/2020 0646 Last data filed at 08/29/2020 0600 Gross per 24 hour  Intake 1445 ml  Output 3087 ml  Net -1642 ml   Weight change:   Physical Exam: Gen: Resting comfortably, opens eyes with touch/calling  out his name CVS: Pulse regular rhythm, normal rate, S1 and S2 normal Resp: Clear to auscultation bilaterally, on ventilator via tracheostomy.  Right IJ TDC connected to CRRT. Abd: Soft, PEG tube in situ-tube feeds on hold, left lower quadrant ostomy with intact bag.  Ext: Trace lower extremity/upper extremity edema, functionally quadriplegic  Imaging: IR Fluoro Guide CV Line Left  Result Date: 08/28/2020 INDICATION: Sepsis Pneumonia EXAM: Fluoroscopy and ultrasound-guided tunneled central venous catheter placement MEDICATIONS: None ANESTHESIA/SEDATION: None FLUOROSCOPY TIME:  Fluoroscopy Time: 1.12 minutes (35 mGy). COMPLICATIONS: None immediate. PROCEDURE: Informed written consent was obtained from the patient after a thorough discussion of the procedural risks, benefits and alternatives. All questions were addressed. Maximal Sterile Barrier Technique was utilized including caps, mask, sterile gowns, sterile gloves, sterile drape, hand hygiene and skin antiseptic. A timeout was performed prior to the initiation of the procedure. Patient positioned supine on the angiography table. Left neck and anterior upper chest prepped and draped in the usual sterile fashion. The left internal jugular vein was evaluated with ultrasound and shown to be patent. A permanent ultrasound image was obtained and placed in the patient's medical record. Using sterile gel and a sterile probe cover, the left internal jugular vein was entered with a 21 ga needle during real time ultrasound guidance. 0.018 inch guidewire advanced to the IVC under fluoroscopic guidance. Peel-away sheath placed. Attention then turned to the left anterior upper chest. Following local lidocaine administration, the catheter was tunneled from the chest wall to the venotomy site. The catheter was inserted through the peel-away sheath. The tip of the catheter was positioned at the cavoatrial junction. All lumens of the  catheter aspirated and flushed well.  The catheter was secured to the skin with suture. Skin overlying the right neck venotomy site was sealed with Dermabond. The insertion site was covered with a Biopatch and sterile dressing. IMPRESSION: Left IJ tunneled central venous catheter is ready for use. Electronically Signed   By: Miachel Roux M.D.   On: 08/28/2020 15:18   IR US Guide Vasc Access Left  INDICATION: Sepsis   Pneumonia   EXAM: Fluoroscopy and ultrasound-guided tunneled central venous catheter placement   MEDICATIONS: None   ANESTHESIA/SEDATION: None   FLUOROSCOPY TIME:  Fluoroscopy Time: 1.12 minutes (35 mGy).   COMPLICATIONS: None immediate.   PROCEDURE: Informed written consent was obtained from the patient after a thorough discussion of the procedural risks, benefits and alternatives. All questions were addressed. Maximal Sterile Barrier Technique was utilized including caps, mask, sterile gowns, sterile gloves, sterile drape, hand hygiene and skin antiseptic. A timeout was performed prior to the initiation of the procedure.   Patient positioned supine on the angiography table.   Left neck and anterior upper chest prepped and draped in the usual sterile fashion.   The left internal jugular vein was evaluated with ultrasound and shown to be patent. A permanent ultrasound image was obtained and placed in the patient's medical record. Using sterile gel and a sterile probe cover, the left internal jugular vein was entered with a 21 ga needle during real time ultrasound guidance.   0.018 inch guidewire advanced to the IVC under fluoroscopic guidance. Peel-away sheath placed.   Attention then turned to the left anterior upper chest. Following local lidocaine administration, the catheter was tunneled from the chest wall to the venotomy site. The catheter was inserted through the peel-away sheath. The tip of the catheter was positioned at the cavoatrial junction.   All lumens of the catheter aspirated and flushed well.   The catheter was secured  to the skin with suture. Skin overlying the right neck venotomy site was sealed with Dermabond. The insertion site was covered with a Biopatch and sterile dressing.   IMPRESSION: Left IJ tunneled central venous catheter is ready for use.     Electronically Signed   By: Miachel Roux M.D.   On: 08/28/2020 15:18    DG Abd Portable 1V  Result Date: 08/27/2020 CLINICAL DATA:  Feeding tube malfunction. EXAM: PORTABLE ABDOMEN - 1 VIEW COMPARISON:  Chest radiograph, 08/27/2020. CT abdomen pelvis, 07/26/2020. FINDINGS: Suboptimal evaluation, without enteral contrast opacification via the gastrostomy tube. Gastrostomy tube projecting at the expected location, at the epigastrium. The imaged bowel is not obstructed. Central venous catheter and right ureteral stent, incompletely imaged. No acute osseous abnormality. IMPRESSION: Suboptimal evaluation, without enteral contrast opacification via the gastrostomy tube, within this constraint; Gastrostomy tube projecting at the expected location Electronically Signed   By: Michaelle Birks M.D.   On: 08/27/2020 09:14    Labs: BMET Recent Labs  Lab 08/23/20 0303 08/23/20 1157 08/24/20 0446 08/25/20 1348 08/26/20 0314 08/26/20 0925 08/27/20 0742 08/28/20 0510 08/29/20 0308  NA 133*   < > 130* 130* 129* 132* 132* 131* 132*  K 3.5   < > 4.9 5.3* 4.8 3.1* 4.1 4.0 3.6  CL 95*  --  92* 92* 90* 95* 94* 93* 96*  CO2 22  --  21* 21* _0 GLUCOSE 254*  --  138* 169* 122* 178* 76 164* 195*  BUN 69*  --  102* 135* 145* 46* 70* 86* 53*  CREATININE  1.09  --  1.53* 2.02* 2.15* 1.06 1.42* 1.69* 1.02  CALCIUM 8.9  --  9.2 9.2 9.2 8.9 8.9 9.1 9.1  PHOS 3.2  --  3.9 4.9* 4.3  --   --   --   --    < > = values in this interval not displayed.   CBC Recent Labs  Lab 08/26/20 0314 08/27/20 0742 08/28/20 0510 08/29/20 0308  WBC 28.8* 28.7* 26.9* 28.8*  NEUTROABS  --  22.9* 21.8* 24.2*  HGB 9.8* 9.3* 9.3* 10.3*  HCT 30.8* 28.2* 28.8* 32.2*  MCV 81.9 81.3 81.4  81.9  PLT 517* 489* 488* 504*    Medications:     amiodarone  200 mg Per Tube Daily   apixaban  5 mg Per Tube BID   buPROPion  75 mg Per Tube BID   chlorhexidine gluconate (MEDLINE KIT)  15 mL Mouth Rinse BID   Chlorhexidine Gluconate Cloth  6 each Topical Q0600   darbepoetin (ARANESP) injection - NON-DIALYSIS  40 mcg Subcutaneous Q Sun-1800   guaiFENesin  5 mL Per Tube BID   insulin aspart  0-9 Units Subcutaneous Q4H   insulin glargine-yfgn  20 Units Subcutaneous Daily   mouth rinse  15 mL Mouth Rinse 10 times per day   midodrine  5 mg Per Tube Q8H   multivitamin  1 tablet Per Tube QHS   pantoprazole sodium  40 mg Per Tube QHS   sertraline  100 mg Per Tube Daily   sodium chloride flush  10-40 mL Intracatheter Q12H   Elmarie Shiley, MD 08/29/2020, 6:46 AM

## 2020-08-29 NOTE — Progress Notes (Addendum)
Spoke with patient's wife June at length today.  I updated her on reemergence of Acinetobacter ventilator associated pneumonia.  Explained to her that even when this is treated that we cannot always completely eradicate these organisms and they "hide" in certain areas of the body and can come back at anytime.  Because I had never spoken to her before I opted to explain all the things that were occurring with her husband that were impacting his quality of life.  I reminded her that he is not an optimal candidate for outpatient hemodialysis since he cannot get up in the chair.  Of note we have only been able to find 1 facility that can take this patient on a ventilator with continued inpatient hemodialysis but since this facility was in [place] the wife refused.  The wife lives in [place] and has been unable to visit the patient for most of the hospitalization.  During the conversation I let the wife know that on multiple occasions multiple staff members including Dr. Posey Pronto, nurses, respiratory therapy and other therapists have point-blank asked the patient if he wishes to continue living the rest of his life on the ventilator and having dialysis.  I informed the wife that the patient shook his head "no" and that I obtained the same response from him when I asked.  At this point the wife became somewhat agitated and stated that "he do not know what he saying, he is out of his mind".  She also emphatically informed me that she is his power of attorney and that we needed to stop asking him what he wanted because he does not know.  I reassured the wife that we were continuing to treat everything very aggressively and we are continuing dialysis.  She reemphasized that she is a [place] and that when it is time for the [place] to take him He will.  I tried to encourage her by telling her that sometimes God chooses to allow the healing to occur in [place] but she again reemphasized that she is the power of  attorney and is not willing to back off on care and again states "please do not ask him anymore about what he wants."

## 2020-08-29 NOTE — Progress Notes (Signed)
NAME:  AERION VOLKER, MRN:  [id number], DOB:  1950/12/07, LOS: 74 ADMISSION DATE:  07/09/2020, CONSULTATION DATE:  07/05/2020 REFERRING MD:  Dr Francia Greaves, EDP CHIEF COMPLAINT:  septic shock    History of Present Illness:  70 yo male from [facility] with hx of C3-C5 compressive myelopathy with functional quadriplegia and chronic trach/vent presented to ED with altered mental status and hypotension with recurrent HCAP.  Has multiple recent admissions for sepsis secondary to various infections including UTIs, pneumonias and bacteremia in setting of MDR Klebsiella, MRSA and pseudomonas. Most recent admission September 2021 in setting of sepsis due to MDR Klebsiella with right ureteral calculus s/p double J ureteral stent placement and treated with meropenem x 10days.  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 Central line placed. Drainage from gastrostomy tube >> CT Abd without fluid collection around gastrostomy tube but with diffuse body wall edema. Echo EF 50-55%, mild LVH  7/6 Renal Replacement Therapy ; Code status changed to partial Code (DNR) 7/7 Transfuse 1U PRBC 7/9 add solu cortef 7/10 off pressors, weaning stress steroids  7/11 Pressors added back on again 7/12 ID consult due to rising WBC, hypothermia, hypotension concern for worsening infection. Vancomycin added. Repeat blood cx collected. 7/15 CRRT stopped   7/17 Restarted on low dose levo. No evidence of new infection. Held off on antibiotics. Nephrology contacted family and decision was made to continue iHD this week to allow additional time for renal recovery. Patient is not a long term iHD candidate  7/18 plans for iHD, tolerated with 2L removed  7/19 slight drop in hgb to 6.9 will transfuse 1 unit PRBC  7/20 To [facility], PCCM following for trach / vent needs 7/22>> tolerating HD, will need out of state placement 8/4 last PCCM visit. 8/4 -8/8: No issues. CM workin gon out of state placement  8/15 no acute changes. Continues on PRVC, was apneic with PSV attempt. Did wean to 40% FiO2   Interim History / Subjective:  Appears comfortable   Objective   Blood pressure 96/69, pulse 73, temperature (Abnormal) 96.8 F (36 C), temperature source Oral, resp. rate 20, height '5\' 6"'$  (1.676 m), weight 78.7 kg, SpO2 100 %.    Vent Mode: PRVC FiO2 (%):  [40 %] 40 % Set Rate:  [20 bmp] 20 bmp Vt Set:  [510 mL] 510 mL PEEP:  [5 cmH20] 5 cmH20 Plateau Pressure:  [20 cmH20-34 cmH20] 30 cmH20   Intake/Output Summary (Last 24 hours) at 08/29/2020 0815 Last data filed at 08/29/2020 0700 Gross per 24 hour  Intake 1455 ml  Output 3406 ml  Net -1951 ml   Filed Weights   08/25/20 0355 08/26/20 0300 08/29/20 0500  Weight: 74.8 kg 74.4 kg 78.7 kg    Physical Exam: General this is a chronically ill male who is vent dep HENT trach unremarkable Pulm coarse scattered rhonchi Card rrr Abd soft Neuro eyes open Gu anuric   [facility]  Problem list   Hyperkalemia Tachycardia Hypotension Thrombocytosis  AKI Septic shock 2nd to HCAP and UTI all present on admission -Acinetobacter in sputum culture from 07/12/20. Completed 14 days of Unasyn for -Of pressors on 4/14. Restarted  levo 7/17. -acute on chronic respiratory failure  Assessment & Plan:   C3-C5 compressive Myelopathy w/ resultant Chronic Respiratory failure, vent and trach dependence  Not  weaning Plan Cont current vent support Routine trach care VAP bundle  We will see biweekly while in ICU   Erick Colace ACNP-BC [place] Pager # [phone] OR # [phone] if no answer

## 2020-08-30 DIAGNOSIS — J158 Pneumonia due to other specified bacteria: Secondary | ICD-10-CM | POA: Diagnosis not present

## 2020-08-30 DIAGNOSIS — G8250 Quadriplegia, unspecified: Secondary | ICD-10-CM | POA: Diagnosis not present

## 2020-08-30 DIAGNOSIS — G901 Familial dysautonomia [Riley-Day]: Secondary | ICD-10-CM

## 2020-08-30 DIAGNOSIS — A419 Sepsis, unspecified organism: Secondary | ICD-10-CM | POA: Diagnosis not present

## 2020-08-30 DIAGNOSIS — A4159 Other Gram-negative sepsis: Secondary | ICD-10-CM | POA: Diagnosis not present

## 2020-08-30 LAB — CBC WITH DIFFERENTIAL/PLATELET
Abs Immature Granulocytes: 1.4 10*3/uL — ABNORMAL HIGH (ref 0.00–0.07)
Basophils Absolute: 0.3 10*3/uL — ABNORMAL HIGH (ref 0.0–0.1)
Basophils Relative: 1 %
Eosinophils Absolute: 0 10*3/uL (ref 0.0–0.5)
Eosinophils Relative: 0 %
HCT: 29.3 % — ABNORMAL LOW (ref 39.0–52.0)
Hemoglobin: 9.4 g/dL — ABNORMAL LOW (ref 13.0–17.0)
Lymphocytes Relative: 7 %
Lymphs Abs: 2.4 10*3/uL (ref 0.7–4.0)
MCH: 26.6 pg (ref 26.0–34.0)
MCHC: 32.1 g/dL (ref 30.0–36.0)
MCV: 83 fL (ref 80.0–100.0)
Metamyelocytes Relative: 3 %
Monocytes Absolute: 2 10*3/uL — ABNORMAL HIGH (ref 0.1–1.0)
Monocytes Relative: 6 %
Myelocytes: 1 %
Neutro Abs: 27.8 10*3/uL — ABNORMAL HIGH (ref 1.7–7.7)
Neutrophils Relative %: 82 %
Platelets: 487 10*3/uL — ABNORMAL HIGH (ref 150–400)
RBC: 3.53 MIL/uL — ABNORMAL LOW (ref 4.22–5.81)
RDW: 20.3 % — ABNORMAL HIGH (ref 11.5–15.5)
WBC: 33.9 10*3/uL — ABNORMAL HIGH (ref 4.0–10.5)
nRBC: 0.6 % — ABNORMAL HIGH (ref 0.0–0.2)

## 2020-08-30 LAB — GLUCOSE, CAPILLARY
Glucose-Capillary: 195 mg/dL — ABNORMAL HIGH (ref 70–99)
Glucose-Capillary: 187 mg/dL — ABNORMAL HIGH (ref 70–99)
Glucose-Capillary: 178 mg/dL — ABNORMAL HIGH (ref 70–99)
Glucose-Capillary: 173 mg/dL — ABNORMAL HIGH (ref 70–99)
Glucose-Capillary: 153 mg/dL — ABNORMAL HIGH (ref 70–99)

## 2020-08-30 LAB — BASIC METABOLIC PANEL
Anion gap: 12 (ref 5–15)
BUN: 75 mg/dL — ABNORMAL HIGH (ref 8–23)
CO2: 25 mmol/L (ref 22–32)
Calcium: 9.3 mg/dL (ref 8.9–10.3)
Chloride: 98 mmol/L (ref 98–111)
Creatinine, Ser: 1.36 mg/dL — ABNORMAL HIGH (ref 0.61–1.24)
GFR, Estimated: 56 mL/min — ABNORMAL LOW (ref >60)
Glucose, Bld: 220 mg/dL — ABNORMAL HIGH (ref 70–99)
Potassium: 4.2 mmol/L (ref 3.5–5.1)
Sodium: 135 mmol/L (ref 135–145)

## 2020-08-30 LAB — CULTURE, RESPIRATORY W GRAM STAIN

## 2020-08-30 LAB — PROCALCITONIN: Procalcitonin: 6.35 ng/mL

## 2020-08-30 MED ORDER — INSULIN GLARGINE-YFGN 100 UNIT/ML ~~LOC~~ SOLN
25.0000 [IU] | Freq: Every day | SUBCUTANEOUS | Status: DC
  Administered 2020-08-31 – 2020-09-14 (×11): 25 [IU] via SUBCUTANEOUS
  Filled 2020-08-30 (×16): qty 0.25

## 2020-08-30 MED ORDER — PROSOURCE TF PO LIQD
45.0000 mL | Freq: Three times a day (TID) | ORAL | Status: DC
  Administered 2020-08-30 – 2020-10-20 (×150): 45 mL
  Filled 2020-08-30 (×149): qty 45

## 2020-08-30 NOTE — Progress Notes (Addendum)
[facility] PROGRESS NOTE  Lennin E Younes MRN:[id number] DOB: 07/06/1950 DOA: 06/22/2020 PCP: Van Eyk, Jason, MD  Status: Remains inpatient appropriate because:Hemodynamically unstable, Persistent severe electrolyte disturbances, Unsafe d/c plan, and Inpatient level of care appropriate due to severity of illness  Dispo: The patient is from:  [facility]              Anticipated d/c is to: SNF Vent capable w/ access to HD              Patient currently is not medically stable to d/c.   Difficult to place patient Yes    Level of care: ICU  Code Status: Partial (no CPR and no defibrillation or cardioversion) Family Communication: Wife June on 8/17.  Of note the wife is also the POA DVT prophylaxis: Eliquis COVID vaccination status: Unknown    HPI:  70 y.o. 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 Patient presented secondary to altered mental status and hypotension with recurrent pneumonia requiring vasopressor support and ICU admission.he has had multiple recent admissions for sepsis secondary to various infections including UTIs, pneumonias and bacteremia in setting of MDR Klebsiella, MRSA and pseudomonas. Most recent admission September 2021 in setting of sepsis due to MDR Klebsiella with right ureteral calculus s/p double J ureteral stent placement and treated with meropenem x 10days. He was discharged back to [facility].  ICU significant events: 6/29 admitted to ICU for septic shock on levo to maintain MAP>65, continued on vent support 6/29 Blood Cx>> staph species in 1 of 4 cultures drawn>> suspect contaminant 6/30 Off pressors 7/2 ID Consult for MDR acinetobacter in trach asp, Meropenem changed to unasyn 7/2 PRBC transfusion, iron replacement 7/5 Pressors added again. Central line placed. Drainage from gastrostomy tube >> CT Abd without fluid collection around gastrostomy tube but with diffuse body wall  edema. Echo EF 50-55%, mild LVH 7/6 Renal Replacement Therapy ; Code status changed to partial Code (DNR) 7/7 Transfuse 1U PRBC 7/9 add solu cortef 7/10 off pressors, weaning stress steroids 7/11 Pressors added back on again 7/12 ID consult due to rising WBC, hypothermia, hypotension concern for worsening infection. Vancomycin added. Repeat blood cx collected. 7/15 CRRT stopped 7/17 Restarted on low dose levo. No evidence of new infection. Held off on antibiotics. Nephrology contacted family and decision was made to continue iHD this week to allow additional time for renal recovery. Patient is not the best long term iHD candidate but family wants aggressive care 7/18 plans for iHD, tolerated with 2L removed 719 slight drop in hgb to 6.9 will transfuse 1 unit PRBC 7/22 > 7/26: Tolerating HD. TOC consulted for placement. Will likely need out of state placement. 8/15 WBCs greater than 28,000, hypoglycemia, procalcitonin >18, chest x-ray consistent with pneumonia, recent blood cultures consistent with contamination.  Unasyn IV resumed after sputum culture obtained 8/16 consultation placed with ethics committee regarding evaluation for futility of care 8/16 Sputum cx + for Acinetobacter with sensitivities pending 8/17 extensive conversation held with patient's wife regarding patient's poor prognosis and reemergence of Acinetobacter VAP.  Also discussed with her that patient has been able to convey to multiple staff that he does not want to continue on the ventilator or with dialysis.  Wife reports that the patient does not understand what he is saying, she is the POA and that she wants to continue aggressive care as he is 8/18 Acinetobacter sensitive to Unasyn only.  Discussed with ID/Dr. Wallace.  Plan   is to administer antibiotics for 7-day since patient has not had any increased O2 needs and remains afebrile.  If any concerns over worsening symptoms can give an additional 3 days.   Subjective: Today  he is telling multiple providers that he wants to continue dialysis.  He has been asked if he wants to continue dialysis and nods his head "yes".  Objective: Vitals:   08/30/20 0600 08/30/20 0725  BP: 115/71   Pulse: 84   Resp: 20   Temp:    SpO2: 98% 98%    Intake/Output Summary (Last 24 hours) at 08/30/2020 0743 Last data filed at 08/29/2020 2300 Gross per 24 hour  Intake 835 ml  Output 449 ml  Net 386 ml   Filed Weights   08/26/20 0300 08/29/20 0500 08/30/20 0500  Weight: 74.4 kg 78.7 kg 78.7 kg    Exam:  Constitutional: NAD, awake and alert Respiratory: Trach: 6.0 XLT cuffed, Vent settings: PRVC mode, Fio2 40%, rate 20, VT 510, PEEP 5, lung sounds remain coarse with expiratory rhonchi primarily in the mid and lower fields.  Respiratory effort per vent (diaphragm paralyzed).   Cardiovascular: SR, normotensive.  No extremity edema except for focal dependent bilateral hand edema, 1+ pedal pulses. No JVD Abdomen: no tenderness, no masses palpated. Bowel sounds positive. LLQ colostomy, PEG tube for feeds, LBM 8/17 Neurologic: CN 2-12 grossly intact on visual inspection- patient with quadriplegia so unable to independently move extremities.  Noted to not have any sensation when tested Psychiatric: Awake and nods head appropriately to questions asked.  Unable to accurately assess orientation though his inability to phonate.   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  Recurrent ventilator associated pneumonia 2/2 recurrent Acinetobacter/sepsis without shock WBC trend: 28.7 > 26.9 > 28.8 > 33.9 Procalcitonin trend: 18.73 > 14.42 > 5.54 > 6.35 CRP trend: 28.4 > 25.5 > 25.4 Lactic acid normal  Blood cultures with  contaminant 8/16 sputum culture + Acinetobacter Baumanii and as of 8/18 sensitive to Unasyn only.  Discussed with ID recommends 7 days of therapy but if clinically decompensates recommends an additional 3 days more WBCs worsening and PCT trending back up patient has not had any increased O2 needs and remains afebrile _0  initially with ventilator associated pneumonia secondary to Acinetobacter and completed 14 days of Unasyn@   AKI with oliguria/volume overload/ New CKD requiring HD Anuric Nephrology following, initially required CRRT and has transition to HD Palliative care has been consulted and are following.  TDC placed on 7/26-family wants aggressive measures so anticipate will need permanent access -Nephrology recommendations: Continuing HD   Septic shock 2/2 VAP Resolved Presented with sepsis physiology and profound shock which has now resolved Now has reemergence of sepsis (see above) without shock  History of paroxysmal atrial fibrillation Patient was on amiodarone prior to admission  Currently maintaining sinus rhythm TSH was 0.922 on 07/16/2018 QTc 420 ms on 6/23   Acute metabolic encephalopathy Resolved Secondary to acute illness. CT head negative for acute process.  Reportedly back at baseline.   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 Improving. 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  Previously resided at [facility] but with poor performance scores as well  as poor rehabilitation potential [facility] will not accept him back if he is HD dependent (he must be able to sit up in a chair for dialysis.)   Microcytic anemia/Acute anemia Patient required 4 units of PRBC to date. Thought to be secondary to a combination of acute illness, chronic disease and iron deficiency. IV iron given.  Hemoglobin on 8/17 was 10.3   Severe protein calorie malnutrition Nutrition Problem: Increased nutrient needs Etiology: wound healing Signs/Symptoms: estimated needs Interventions: Tube feeding, Prostat, MVI Estimated body mass index is 28 kg/m as calculated from the following:   Height as of this encounter: 5' 6" (1.676 m).   Weight as of this encounter: 78.7 kg.   Pressure injury Stage IV medial/posterior sacrum present on admission.  Right/posterior/lateral ankle unknown if present on admission.  DTI left ear, not present on admission. (For additional documentation see wound care notes)    Data Reviewed: Basic Metabolic Panel: Recent Labs  Lab 08/24/20 0446 08/25/20 1348 08/26/20 0314 08/26/20 0925 08/27/20 0742 08/28/20 0510 08/29/20 0308  NA 130* 130* 129* 132* 132* 131* 132*  K 4.9 5.3* 4.8 3.1* 4.1 4.0 3.6  CL 92* 92* 90* 95* 94* 93* 96*  CO2 21* 21* _0 GLUCOSE 138* 169* 122* 178* 76 164* 195*  BUN 102* 135* 145* 46* 70* 86* 53*  CREATININE 1.53* 2.02* 2.15* 1.06 1.42* 1.69* 1.02  CALCIUM 9.2 9.2 9.2 8.9 8.9 9.1 9.1  MG  --   --   --   --  2.1  --   --   PHOS 3.9 4.9* 4.3  --   --   --   --    Liver Function Tests: Recent Labs  Lab 08/25/20 1348 08/26/20 0314 08/27/20 0742 08/28/20 0510 08/29/20 0308  AST  --   --  53* 47* 50*  ALT  --   --  37 34 36  ALKPHOS  --   --  333* 322* 344*  BILITOT  --   --  1.4* 0.9 0.8  PROT  --   --  6.6 6.6 7.2  ALBUMIN 2.0* 1.9* 1.7* 1.7* 1.9*   No results for input(s): LIPASE, AMYLASE in the last 168 hours. No results for input(s): AMMONIA in the last 168 hours. CBC: Recent Labs   Lab 08/23/20 1157 08/26/20 0314 08/27/20 0742 08/28/20 0510 08/29/20 0308  WBC  --  28.8* 28.7* 26.9* 28.8*  NEUTROABS  --   --  22.9* 21.8* 24.2*  HGB 13.3 9.8* 9.3* 9.3* 10.3*  HCT 39.0 30.8* 28.2* 28.8* 32.2*  MCV  --  81.9 81.3 81.4 81.9  PLT  --  517* 489* 488* 504*   Cardiac Enzymes: No results for input(s): CKTOTAL, CKMB, CKMBINDEX, TROPONINI in the last 168 hours. BNP (last 3 results) Recent Labs    06/20/2020 1922  BNP 135.0*    ProBNP (last 3 results) No results for input(s): PROBNP in the last 8760 hours.  CBG: Recent Labs  Lab 08/29/20 1101 08/29/20 1528 08/29/20 2002 08/29/20 2324 08/30/20 0511  GLUCAP 218* 175* 125* 137* 195*  Studies: IR Fluoro Guide CV Line Left  Result Date: 08/28/2020 INDICATION: Sepsis Pneumonia EXAM: Fluoroscopy and ultrasound-guided tunneled central venous catheter placement MEDICATIONS: None ANESTHESIA/SEDATION: None FLUOROSCOPY TIME:  Fluoroscopy Time: 1.12 minutes (35 mGy). COMPLICATIONS: None immediate. PROCEDURE: Informed written consent was obtained from the patient after a thorough discussion of the procedural risks, benefits and alternatives. All questions were addressed. Maximal Sterile Barrier Technique was utilized including caps, mask, sterile gowns, sterile gloves, sterile drape, hand hygiene and skin antiseptic. A timeout was performed prior to the initiation of the procedure. Patient positioned supine on the angiography table. Left neck and anterior upper chest prepped and draped in the usual sterile fashion. The left internal jugular vein was evaluated with ultrasound and shown to be patent. A permanent ultrasound image was obtained and placed in the patient's medical record. Using sterile gel and a sterile probe cover, the left internal jugular vein was entered with a 21 ga needle during real time ultrasound guidance. 0.018 inch guidewire advanced to the IVC under fluoroscopic guidance. Peel-away sheath placed.  Attention then turned to the left anterior upper chest. Following local lidocaine administration, the catheter was tunneled from the chest wall to the venotomy site. The catheter was inserted through the peel-away sheath. The tip of the catheter was positioned at the cavoatrial junction. All lumens of the catheter aspirated and flushed well. The catheter was secured to the skin with suture. Skin overlying the right neck venotomy site was sealed with Dermabond. The insertion site was covered with a Biopatch and sterile dressing. IMPRESSION: Left IJ tunneled central venous catheter is ready for use. Electronically Signed   By: Farhaan  Mir M.D.   On: 08/28/2020 15:18   IR US Guide Vasc Access Left  INDICATION: Sepsis   Pneumonia   EXAM: Fluoroscopy and ultrasound-guided tunneled central venous catheter placement   MEDICATIONS: None   ANESTHESIA/SEDATION: None   FLUOROSCOPY TIME:  Fluoroscopy Time: 1.12 minutes (35 mGy).   COMPLICATIONS: None immediate.   PROCEDURE: Informed written consent was obtained from the patient after a thorough discussion of the procedural risks, benefits and alternatives. All questions were addressed. Maximal Sterile Barrier Technique was utilized including caps, mask, sterile gowns, sterile gloves, sterile drape, hand hygiene and skin antiseptic. A timeout was performed prior to the initiation of the procedure.   Patient positioned supine on the angiography table.   Left neck and anterior upper chest prepped and draped in the usual sterile fashion.   The left internal jugular vein was evaluated with ultrasound and shown to be patent. A permanent ultrasound image was obtained and placed in the patient's medical record. Using sterile gel and a sterile probe cover, the left internal jugular vein was entered with a 21 ga needle during real time ultrasound guidance.   0.018 inch guidewire advanced to the IVC under fluoroscopic guidance. Peel-away sheath placed.   Attention then turned to the  left anterior upper chest. Following local lidocaine administration, the catheter was tunneled from the chest wall to the venotomy site. The catheter was inserted through the peel-away sheath. The tip of the catheter was positioned at the cavoatrial junction.   All lumens of the catheter aspirated and flushed well.   The catheter was secured to the skin with suture. Skin overlying the right neck venotomy site was sealed with Dermabond. The insertion site was covered with a Biopatch and sterile dressing.   IMPRESSION: Left IJ tunneled central venous catheter is ready for use.     Electronically Signed     By: Farhaan  Mir M.D.   On: 08/28/2020 15:18     Scheduled Meds:  amiodarone  200 mg Per Tube Daily   apixaban  5 mg Per Tube BID   buPROPion  75 mg Per Tube BID   chlorhexidine gluconate (MEDLINE KIT)  15 mL Mouth Rinse BID   Chlorhexidine Gluconate Cloth  6 each Topical Q0600   darbepoetin (ARANESP) injection - NON-DIALYSIS  40 mcg Subcutaneous Q Sun-1800   guaiFENesin  5 mL Per Tube BID   insulin aspart  0-9 Units Subcutaneous Q4H   insulin glargine-yfgn  20 Units Subcutaneous Daily   mouth rinse  15 mL Mouth Rinse 10 times per day   midodrine  5 mg Per Tube Q8H   multivitamin  1 tablet Per Tube QHS   pantoprazole sodium  40 mg Per Tube QHS   sertraline  100 mg Per Tube Daily   sodium chloride flush  10-40 mL Intracatheter Q12H   Continuous Infusions:  sodium chloride Stopped (07/26/20 0824)   sodium chloride 250 mL (08/28/20 2029)   ampicillin-sulbactam (UNASYN) IV Stopped (08/29/20 2232)   feeding supplement (NEPRO CARB STEADY) 1,000 mL (08/29/20 2110)    Active Problems:   Septic shock (HCC)   Pressure injury of skin   Acute respiratory failure with hypoxia (HCC)   AKI (acute kidney injury) (HCC)   Hypotension   Goals of care, counseling/discussion   Quadriplegia (HCC)   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)   Hyperglycemia   Sepsis due to Acinetobacter  baumannii (HCC)   Pneumonia due to Acinetobacter species (HCC)   Consultants: PCCM Nephrology Infectious disease Palliative care medicine  Procedures: Echocardiogram Insertion of double-lumen HD catheter  Antibiotics: Cefepime x1 dose Meropenem 6/29 through 7/2 Vancomycin 6/29 through 6/30 Unasyn 7/2 through 7/15 Unasyn 8/15 >   Time spent: 35 minutes    Allison Ellis ANP  [facility] 7 am - 330 pm/M-F for direct patient care and secure chat Please refer to Amion for contact info 50  days           

## 2020-08-30 NOTE — Progress Notes (Signed)
Patient ID: Luis Orr, male   DOB: 10/19/1950, 70 y.o.   MRN: [id number]  [facility] Progress Note   Assessment/ Plan:   1. 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  PIRRT yesterday morning (again, CRRT was done entirely for logistic reasons of hemodialysis nurse shortage) per my instructions and will be scheduled for hemodialysis tomorrow. 2.  Septic shock/acute metabolic encephalopathy: Hemodynamically stable off pressors.  Sputum yesterday revealed multiple GNR's and with history of Acinetobacter-restarted back on intravenous Unasyn. 3.  Chronic respiratory failure: Status post tracheostomy with ventilator management per CCM 4.  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  Chronic kidney disease/metabolic bone disease: Calcium and phosphorus level under decent control with hemodialysis, off binders  Subjective:   No acute events noted overnight, ethics consult done yesterday to help clarify goals of care/path ahead.  When I asked him whether he wants to continue long-term hemodialysis in order to keep him alive, he nods "yes" today.   Objective:   BP 115/71   Pulse 84   Temp 99.7 F (37.6 C) (Oral)   Resp 20   Ht _0  (1.676 m)   Wt 78.7 kg   SpO2 98%   BMI 28.00 kg/m   Intake/Output Summary (Last 24 hours) at 08/30/2020 0717 Last data filed at 08/29/2020 2300 Gross per 24 hour  Intake 835 ml  Output 449 ml  Net 386 ml   Weight change: 0 kg  Physical Exam: Gen: Comfortably resting on ventilator via tracheostomy, opens eyes/nods head to questions. CVS: Pulse regular rhythm, normal rate, S1 and S2 normal Resp: Coarse breath  sounds bilaterally, no secretions around tracheostomy.  Right IJ TDC. Abd: Soft, PEG tube in situ-tube feeds on hold, left lower quadrant ostomy with intact bag.  Ext: Trace lower extremity/upper extremity edema, functionally quadriplegic  Imaging: IR Fluoro Guide CV Line Left  Result Date: 08/28/2020 INDICATION: Sepsis Pneumonia EXAM: Fluoroscopy and ultrasound-guided tunneled central venous catheter placement MEDICATIONS: None ANESTHESIA/SEDATION: None FLUOROSCOPY TIME:  Fluoroscopy Time: 1.12 minutes (35 mGy). COMPLICATIONS: None immediate. PROCEDURE: Informed written consent was obtained from the patient after a thorough discussion of the procedural risks, benefits and alternatives. All questions were addressed. Maximal Sterile Barrier Technique was utilized including caps, mask, sterile gowns, sterile gloves, sterile drape, hand hygiene and skin antiseptic. A timeout was performed prior to the initiation of the procedure. Patient positioned supine on the angiography table. Left neck and anterior upper chest prepped and draped in the usual sterile fashion. The left internal jugular vein was evaluated with ultrasound and shown to be patent. A permanent ultrasound image was obtained and placed in the patient's medical record. Using sterile gel and a sterile probe cover, the left internal jugular vein was entered with a 21 ga needle during real time ultrasound guidance. 0.018 inch guidewire advanced to the IVC under fluoroscopic guidance. Peel-away sheath placed. Attention then turned to the left anterior upper chest. Following local lidocaine administration, the catheter was tunneled from the chest wall to the venotomy site. The catheter was inserted through the peel-away sheath. The tip of the catheter was positioned at the cavoatrial junction. All lumens of the catheter aspirated and flushed well. The catheter was secured to the skin with  suture. Skin overlying the right neck venotomy site was sealed with  Dermabond. The insertion site was covered with a Biopatch and sterile dressing. IMPRESSION: Left IJ tunneled central venous catheter is ready for use. Electronically Signed   By: Miachel Roux M.D.   On: 08/28/2020 15:18   IR US Guide Vasc Access Left  INDICATION: Sepsis   Pneumonia   EXAM: Fluoroscopy and ultrasound-guided tunneled central venous catheter placement   MEDICATIONS: None   ANESTHESIA/SEDATION: None   FLUOROSCOPY TIME:  Fluoroscopy Time: 1.12 minutes (35 mGy).   COMPLICATIONS: None immediate.   PROCEDURE: Informed written consent was obtained from the patient after a thorough discussion of the procedural risks, benefits and alternatives. All questions were addressed. Maximal Sterile Barrier Technique was utilized including caps, mask, sterile gowns, sterile gloves, sterile drape, hand hygiene and skin antiseptic. A timeout was performed prior to the initiation of the procedure.   Patient positioned supine on the angiography table.   Left neck and anterior upper chest prepped and draped in the usual sterile fashion.   The left internal jugular vein was evaluated with ultrasound and shown to be patent. A permanent ultrasound image was obtained and placed in the patient's medical record. Using sterile gel and a sterile probe cover, the left internal jugular vein was entered with a 21 ga needle during real time ultrasound guidance.   0.018 inch guidewire advanced to the IVC under fluoroscopic guidance. Peel-away sheath placed.   Attention then turned to the left anterior upper chest. Following local lidocaine administration, the catheter was tunneled from the chest wall to the venotomy site. The catheter was inserted through the peel-away sheath. The tip of the catheter was positioned at the cavoatrial junction.   All lumens of the catheter aspirated and flushed well.   The catheter was secured to the skin with suture. Skin overlying the right neck venotomy site was sealed with Dermabond. The insertion  site was covered with a Biopatch and sterile dressing.   IMPRESSION: Left IJ tunneled central venous catheter is ready for use.     Electronically Signed   By: Miachel Roux M.D.   On: 08/28/2020 15:18     Labs: BMET Recent Labs  Lab 08/24/20 0446 08/25/20 1348 08/26/20 0314 08/26/20 0925 08/27/20 0742 08/28/20 0510 08/29/20 0308  NA 130* 130* 129* 132* 132* 131* 132*  K 4.9 5.3* 4.8 3.1* 4.1 4.0 3.6  CL 92* 92* 90* 95* 94* 93* 96*  CO2 21* 21* _0 GLUCOSE 138* 169* 122* 178* 76 164* 195*  BUN 102* 135* 145* 46* 70* 86* 53*  CREATININE 1.53* 2.02* 2.15* 1.06 1.42* 1.69* 1.02  CALCIUM 9.2 9.2 9.2 8.9 8.9 9.1 9.1  PHOS 3.9 4.9* 4.3  --   --   --   --    CBC Recent Labs  Lab 08/26/20 0314 08/27/20 0742 08/28/20 0510 08/29/20 0308  WBC 28.8* 28.7* 26.9* 28.8*  NEUTROABS  --  22.9* 21.8* 24.2*  HGB 9.8* 9.3* 9.3* 10.3*  HCT 30.8* 28.2* 28.8* 32.2*  MCV 81.9 81.3 81.4 81.9  PLT 517* 489* 488* 504*    Medications:     amiodarone  200 mg Per Tube Daily   apixaban  5 mg Per Tube BID   buPROPion  75 mg Per Tube BID   chlorhexidine gluconate (MEDLINE KIT)  15 mL Mouth Rinse BID   Chlorhexidine Gluconate Cloth  6 each Topical Q0600   darbepoetin (ARANESP) injection - NON-DIALYSIS  40 mcg Subcutaneous Q Sun-1800   guaiFENesin  5 mL Per Tube BID   insulin aspart  0-9 Units Subcutaneous Q4H   insulin glargine-yfgn  20 Units Subcutaneous Daily   mouth rinse  15 mL Mouth Rinse 10 times per day   midodrine  5 mg Per Tube Q8H   multivitamin  1 tablet Per Tube QHS   pantoprazole sodium  40 mg Per Tube QHS   sertraline  100 mg Per Tube Daily   sodium chloride flush  10-40 mL Intracatheter Q12H   Elmarie Shiley, MD 08/30/2020, 7:17 AM

## 2020-08-30 NOTE — Progress Notes (Signed)
  PROGRESS NOTE Luis Orr  [id number] DOB: Aug 11, 1950 DOA: 06/19/2020 PCP: Townsend Roger, MD   Subjective: Seen this this afternoon.  I had also seen him yesterday. Alert awake eyes are open able to respond with head nod. Being washed by CNA.  Objective: Vitals: Today's Vitals   08/30/20 1100 08/30/20 1113 08/30/20 1143 08/30/20 1200  BP: 113/67   116/72  Pulse: 81   80  Resp: 20   20  Temp:   98.4 F (36.9 C)   TempSrc:   Oral   SpO2: 99% 99%  96%  Weight:      Height:      PainSc:       On exam alert awake eyes open nods for response Lungs bilateral basal crackles mostly clear on vent Abdomen colostomy in place with stool, PEG tube intact, bowel sound present Muscles clean Generalized edema on Lasix Central line+  Assessment & Plan:  Sepsis due to Acinetobacter baumannii. Likely aspiration Frequent pneumonia/VAP Resp culture +, based on C/S-on Unasyn x 5days per ID.Vitals stable.  Now ESRD on HD Received CRRT from due to logistic reason, for HD in the morning. Plan Per nephrology  Chronic respiratory failure s/p trach-vent dependent Pccm following. On vent, not weanable  PAF-on amio and eliquis  S/p Colostomy w/ stool in bag S/p PEG- tolerating tube geed  Acute metabolic encephalopathy Compressive myelopathy with functional quadriplegia Chronic anemia Diabetes Hypertension Diarrhea  Continue monitor on for blood pressure Continue Wellbutrin, and Zoloft Continue PPI Stage IV sacral ulcer POA  LLOS pt, followed by Ebony Hail NP, I have been communicating with her regarding important and nay new issues on daily basis as needed. Reviewed NP notes. GOC- Partial code. Pt nodded yes to continue dialysis.  DVT prophylaxis: eliquis Code Status:   Code Status: Partial Code   Status is: Inpatient Medications reviewed:  Antimicrobials: IV Unasyn LOS: 66 days   Antonieta Pert, MD [facility]  08/30/2020, 2:14 PM

## 2020-08-30 NOTE — Progress Notes (Signed)
Nutrition Follow-up  DOCUMENTATION CODES:   Not applicable  INTERVENTION:   Continue tube feeds via PEG: - Nepro @ 45 ml/hr (1080 ml/day) - ProSource TF 45 ml TID  Tube feeding regimen provides 2064 kcal, 120 grams of protein, and 785 ml of H2O.  - Continue renal MVI daily  NUTRITION DIAGNOSIS:   Increased nutrient needs related to wound healing as evidenced by estimated needs.  Ongoing, being addressed via TF  GOAL:   Patient will meet greater than or equal to 90% of their needs  Met via TF  MONITOR:   Vent status, Labs, Weight trends, TF tolerance, Skin, I & O's  REASON FOR ASSESSMENT:   Ventilator, Consult Enteral/tube feeding initiation and management  ASSESSMENT:   70 year old male who presented to the ED on 6/29 from [facility] with AMS and hypotension. 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 Pt admitted with septic shock, acute on chronic hypoxemic respiratory failure.  Noted Ethics Committee is now involved.  Per Nephrology, no evidence of renal recovery at this point and pt likely ESRD. CRRT done recent entirely for logistic reasons due to HD nurse shortage. Plan for iHD tomorrow.  Pt's current tube feeding is Nepro @ 45 ml/hr which provides 1944 kcal, 87 grams of protein, and 785 ml free water. Nephrology discontinued Juven and ProSource TF due to elevated BUN. Current protein provision is suboptimal to support wound healing. BUN has continued to fluctuate even with d/c of Juven and ProSource TF. RD will add back ProSource TF to meet pt's protein needs.  Discussed pt with RN. Pt tolerating current TF without issue.  Admit weight: 90.2 kg Current weight: 78.7 kg Lowest weight since admit: 72 kg on 8/03  Patient remains on ventilator support via trach MV: 10.5 L/min Temp (24hrs),  Avg:99.2 F (37.3 C), Min:98.6 F (37 C), Max:99.7 F (37.6 C)  Medications reviewed and include: aranesp weekly, SSI q 4 hours, semglee 20 units daily, rena-vit, protonix, IV abx  Labs reviewed: BUN 75, creatinine 1.36, hemoglobin 9.4 CBG's: 125-195 x 24 hours  Colostomy: 175 ml x 12 hours I/O's: +1.9 L since admit  Diet Order:   Diet Order             Diet NPO time specified  Diet effective midnight                   EDUCATION NEEDS:   No education needs have been identified at this time  Skin:  Skin Assessment: Skin Integrity Issues: DTI: L ear Stage IV: sacrum  Last BM:  08/29/20 colostomy  Height:   Ht Readings from Last 1 Encounters:  07/31/20 5' 6" (1.676 m)    Weight:   Wt Readings from Last 1 Encounters:  08/30/20 78.7 kg    Ideal Body Weight:  58 kg (adjusted for quadriplegia)  BMI:  Body mass index is 28 kg/m.  Estimated Nutritional Needs:   Kcal:  1900-2100  Protein:  110-120 gm  Fluid:  1 L + UOP    Gustavus Bryant, MS, RD, LDN Inpatient Clinical Dietitian Please see AMiON for contact information.

## 2020-08-31 DIAGNOSIS — G8250 Quadriplegia, unspecified: Secondary | ICD-10-CM | POA: Diagnosis not present

## 2020-08-31 DIAGNOSIS — J158 Pneumonia due to other specified bacteria: Secondary | ICD-10-CM | POA: Diagnosis not present

## 2020-08-31 DIAGNOSIS — A419 Sepsis, unspecified organism: Secondary | ICD-10-CM | POA: Diagnosis not present

## 2020-08-31 DIAGNOSIS — A4159 Other Gram-negative sepsis: Secondary | ICD-10-CM | POA: Diagnosis not present

## 2020-08-31 LAB — COMPREHENSIVE METABOLIC PANEL
ALT: 27 U/L (ref 0–44)
AST: 33 U/L (ref 15–41)
Albumin: 1.6 g/dL — ABNORMAL LOW (ref 3.5–5.0)
Alkaline Phosphatase: 261 U/L — ABNORMAL HIGH (ref 38–126)
Anion gap: 14 (ref 5–15)
BUN: 92 mg/dL — ABNORMAL HIGH (ref 8–23)
CO2: 23 mmol/L (ref 22–32)
Calcium: 9.6 mg/dL (ref 8.9–10.3)
Chloride: 96 mmol/L — ABNORMAL LOW (ref 98–111)
Creatinine, Ser: 1.69 mg/dL — ABNORMAL HIGH (ref 0.61–1.24)
GFR, Estimated: 43 mL/min — ABNORMAL LOW (ref >60)
Glucose, Bld: 181 mg/dL — ABNORMAL HIGH (ref 70–99)
Potassium: 4.2 mmol/L (ref 3.5–5.1)
Sodium: 133 mmol/L — ABNORMAL LOW (ref 135–145)
Total Bilirubin: 1 mg/dL (ref 0.3–1.2)
Total Protein: 6.7 g/dL (ref 6.5–8.1)

## 2020-08-31 LAB — C-REACTIVE PROTEIN: CRP: 19.7 mg/dL — ABNORMAL HIGH (ref <1.0)

## 2020-08-31 LAB — CBC WITH DIFFERENTIAL/PLATELET
Abs Immature Granulocytes: 2.47 10*3/uL — ABNORMAL HIGH (ref 0.00–0.07)
Basophils Absolute: 0.1 10*3/uL (ref 0.0–0.1)
Basophils Relative: 0 %
Eosinophils Absolute: 0.2 10*3/uL (ref 0.0–0.5)
Eosinophils Relative: 1 %
HCT: 27.7 % — ABNORMAL LOW (ref 39.0–52.0)
Hemoglobin: 8.7 g/dL — ABNORMAL LOW (ref 13.0–17.0)
Immature Granulocytes: 8 %
Lymphocytes Relative: 7 %
Lymphs Abs: 2.1 10*3/uL (ref 0.7–4.0)
MCH: 26 pg (ref 26.0–34.0)
MCHC: 31.4 g/dL (ref 30.0–36.0)
MCV: 82.9 fL (ref 80.0–100.0)
Monocytes Absolute: 1.8 10*3/uL — ABNORMAL HIGH (ref 0.1–1.0)
Monocytes Relative: 6 %
Neutro Abs: 23 10*3/uL — ABNORMAL HIGH (ref 1.7–7.7)
Neutrophils Relative %: 78 %
Platelets: 509 10*3/uL — ABNORMAL HIGH (ref 150–400)
RBC: 3.34 MIL/uL — ABNORMAL LOW (ref 4.22–5.81)
RDW: 20.3 % — ABNORMAL HIGH (ref 11.5–15.5)
WBC: 29.6 10*3/uL — ABNORMAL HIGH (ref 4.0–10.5)
nRBC: 0.5 % — ABNORMAL HIGH (ref 0.0–0.2)

## 2020-08-31 LAB — CULTURE, BLOOD (ROUTINE X 2): Culture: NO GROWTH

## 2020-08-31 LAB — GLUCOSE, CAPILLARY
Glucose-Capillary: 140 mg/dL — ABNORMAL HIGH (ref 70–99)
Glucose-Capillary: 140 mg/dL — ABNORMAL HIGH (ref 70–99)
Glucose-Capillary: 144 mg/dL — ABNORMAL HIGH (ref 70–99)
Glucose-Capillary: 170 mg/dL — ABNORMAL HIGH (ref 70–99)
Glucose-Capillary: 174 mg/dL — ABNORMAL HIGH (ref 70–99)
Glucose-Capillary: 176 mg/dL — ABNORMAL HIGH (ref 70–99)
Glucose-Capillary: 180 mg/dL — ABNORMAL HIGH (ref 70–99)

## 2020-08-31 LAB — PROCALCITONIN: Procalcitonin: 5.39 ng/mL

## 2020-08-31 MED ORDER — HEPARIN SODIUM (PORCINE) 1000 UNIT/ML DIALYSIS: 40.0000 [IU]/kg | INTRAMUSCULAR | Status: DC | PRN

## 2020-08-31 NOTE — Progress Notes (Addendum)
[facility] PROGRESS NOTE  Luis Orr AUQ:[id number] DOB: [birth year] DOA: 07/01/2020 PCP: Townsend Roger, MD  Status: Remains inpatient appropriate because:Hemodynamically unstable, Persistent severe electrolyte disturbances, Unsafe d/c plan, and Inpatient level of care appropriate due to severity of illness  Dispo: The patient is from:  [facility]              Anticipated d/c is to: SNF Vent capable w/ access to in facility/stretcher based HD              Patient currently is not medically stable to d/c.   Difficult to place patient Yes    Level of care: ICU  Code Status: Partial (no CPR and no defibrillation or cardioversion) Family Communication: Wife June on 8/17.  Of note the wife is also the POA DVT prophylaxis: Eliquis COVID vaccination status: Unknown    HPI:  70 y.o. 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 Patient presented secondary to altered mental status and hypotension with recurrent pneumonia requiring vasopressor support and ICU admission.he has had multiple recent admissions for sepsis secondary to various infections including UTIs, pneumonias and bacteremia in setting of MDR Klebsiella, MRSA and pseudomonas. Most recent admission September 2021 in setting of sepsis due to MDR Klebsiella with right ureteral calculus s/p double J ureteral stent placement and treated with meropenem x 10days. He was discharged back to [facility].  ICU significant events: 6/29 admitted to ICU for septic shock on levo to maintain MAP>65, continued on vent support 6/29 Blood Cx>> staph species in 1 of 4 cultures drawn>> suspect contaminant 6/30 Off pressors 7/2 ID Consult for MDR acinetobacter in trach asp, Meropenem changed to unasyn 7/2 PRBC transfusion, iron replacement 7/5 Pressors added again. Central line placed. Drainage from gastrostomy tube >> CT Abd without fluid collection around gastrostomy tube  but with diffuse body wall edema. Echo EF 50-55%, mild LVH 7/6 Renal Replacement Therapy ; Code status changed to partial Code (DNR) 7/7 Transfuse 1U PRBC 7/9 add solu cortef 7/10 off pressors, weaning stress steroids 7/11 Pressors added back on again 7/12 ID consult due to rising WBC, hypothermia, hypotension concern for worsening infection. Vancomycin added. Repeat blood cx collected. 7/15 CRRT stopped 7/17 Restarted on low dose levo. No evidence of new infection. Held off on antibiotics. Nephrology contacted family and decision was made to continue iHD this week to allow additional time for renal recovery. Patient is not the best long term iHD candidate but family wants aggressive care 7/18 plans for iHD, tolerated with 2L removed 719 slight drop in hgb to 6.9 will transfuse 1 unit PRBC 7/22 > 7/26: Tolerating HD. TOC consulted for placement. Will likely need out of state placement. 8/15 WBCs greater than 28,000, hypoglycemia, procalcitonin >18, chest x-ray consistent with pneumonia, recent blood cultures consistent with contamination.  Unasyn IV resumed after sputum culture obtained 8/16 consultation placed with ethics committee regarding evaluation for futility of care 8/16 Sputum cx + for Acinetobacter with sensitivities pending 8/17 extensive conversation held with patient's wife regarding patient's poor prognosis and reemergence of Acinetobacter VAP.  Also discussed with her that patient has been able to convey to multiple staff that he does not want to continue on the ventilator or with dialysis.  Wife reports that the patient does not understand what he is saying, she is the POA and that she wants to continue aggressive care as he is 8/18 Acinetobacter sensitive to Unasyn only.  Discussed with ID/Dr.  Juleen China.  Plan is to administer antibiotics for 7-day since patient has not had any increased O2 needs and remains afebrile.  If any concerns over worsening symptoms can give an additional 3  days. Developed moderate bleeding around tracheostomy site on 8/19.  Likely related to combination of Eliquis and heparin required for CRRT.  We will hold Eliquis for now and monitor degree of bleeding   Subjective: Dialysis at bedside in progress.  Patient reporting both myself and his RN that he is having back pain.  He is unable to localize which portion of his back is hurting.  Objective: Vitals:   08/31/20 0500 08/31/20 0600  BP: 113/76 102/75  Pulse: 81 76  Resp: 20 20  Temp:    SpO2: 98% 97%    Intake/Output Summary (Last 24 hours) at 08/31/2020 0719 Last data filed at 08/31/2020 0100 Gross per 24 hour  Intake 660 ml  Output --  Net 660 ml   Filed Weights   08/29/20 0500 08/30/20 0500 08/31/20 0409  Weight: 78.7 kg 78.7 kg 78.8 kg    Exam:  Constitutional: NAD, awake and alert Respiratory: Trach: 6.0 XLT cuffed, Vent settings: PRVC mode, Fio2 40%, rate 20, VT 510, PEEP 5, lung sounds remain coarse with expiratory rhonchi primarily in the mid and lower fields.  Respiratory effort per vent (diaphragm paralyzed).  Nursing reporting that patient continues to have thick tan secretions when suctioned Cardiovascular: SR, normotensive.  Focal dependent bilateral hand edema, 1+ pedal pulses. No JVD Abdomen: no tenderness, no masses palpated. Bowel sounds positive. LLQ colostomy, PEG tube for feeds, LBM 8/18 Neurologic: CN 2-12 grossly intact on visual inspection- patient with quadriplegia so unable to independently move extremities.  No definitive sensation of extremities when checked. Psychiatric: Awake and nods head appropriately to questions asked.  Unable to accurately assess orientation since he is unable to phonate.   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 Developed moderate bleeding around tracheostomy site on 8/19.  Likely related to combination of Eliquis and heparin required for CRRT.  We will hold Eliquis for now and monitor degree of bleeding  Recurrent ventilator associated pneumonia 2/2 recurrent Acinetobacter/sepsis without shock WBC trend: 28.7 > 26.9 > 28.8 > 33.9 > 29.6 Procalcitonin trend: 18.73 > 14.42 > 5.54 > 6.35 > 5.39 CRP trend: 28.4 > 25.5 > 25.4 > 19.7 Lactic acid normal  Blood cultures with contaminant 8/16 sputum culture + Acinetobacter Baumanii and as of 8/18 sensitive to Unasyn only. 8/18 Discussed with ID/Dr Juleen China recommends 7 days of therapy but if clinically decompensates recommends an additional 3 days more Patient remains afebrile, hemodynamically stable with WBC, PCT and CRP all trending downward.  Continues to have copious tracheal secretions. @Presented  initially with ventilator associated pneumonia secondary to Acinetobacter and completed 14 days of Unasyn@   AKI with oliguria/volume overload/ New CKD requiring HD Anuric Nephrology following, initially required CRRT and has transition to HD (due to staffing issues continues on CRRT at bedside) Palliative care has been consulted and are following.  TDC placed on 7/26-family wants aggressive measures so anticipate will need permanent access   Septic shock 2/2 VAP POA Presented with sepsis physiology and profound shock which has now resolved  History of paroxysmal atrial fibrillation Patient was on amiodarone prior to admission  As of  8/19 Eliquis on hold as above Currently maintaining sinus rhythm TSH was 0.922 on 07/16/2018 QTc 420 ms on 9/16   Acute metabolic encephalopathy Resolved Secondary to acute illness. CT head negative for acute process.  Reportedly back at baseline.   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 Improving. 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  Previously resided at [facility] but with poor performance scores as well as poor rehabilitation potential [facility] will not accept him back if he is HD dependent (he must be able to sit up in a chair for dialysis.)   Microcytic anemia/Acute anemia Patient required 4 units of PRBC to date. Thought to be secondary to a combination of acute illness, chronic disease and iron deficiency. IV iron given.  Hemoglobin on 8/17 was 10.3   Severe protein calorie malnutrition Nutrition Problem: Increased nutrient needs Etiology: wound healing Signs/Symptoms: estimated needs Interventions: Tube feeding, Prostat, MVI Estimated body mass index is 28.04 kg/m as calculated from the following:   Height as of this encounter: 5' 6"  (1.676 m).   Weight as of this encounter: 78.8 kg.   Pressure injury Stage IV medial/posterior sacrum present on admission.  Right/posterior/lateral ankle unknown if present on admission.  DTI left ear, not present on admission. (For additional documentation see wound care notes)    Data Reviewed: Basic Metabolic Panel: Recent Labs  Lab 08/25/20 1348 08/26/20 0314 08/26/20 0925 08/27/20 0742 08/28/20 0510 08/29/20 0308 08/30/20 0907 08/31/20 0419  NA 130* 129*   < > 132* 131* 132* 135 133*  K 5.3* 4.8   < > 4.1 4.0 3.6 4.2 4.2  CL 92* 90*   < > 94* 93* 96* 98 96*  CO2 21* 23   < > 25 24 26 25 23   GLUCOSE 169* 122*   < > 76 164* 195* 220* 181*  BUN 135* 145*   < > 70* 86* 53* 75* 92*   CREATININE 2.02* 2.15*   < > 1.42* 1.69* 1.02 1.36* 1.69*  CALCIUM 9.2 9.2   < > 8.9 9.1 9.1 9.3 9.6  MG  --   --   --  2.1  --   --   --   --   PHOS 4.9* 4.3  --   --   --   --   --   --    < > = values in this interval not displayed.   Liver Function Tests: Recent Labs  Lab 08/26/20 0314 08/27/20 0742 08/28/20 0510 08/29/20 0308 08/31/20 0419  AST  --  53* 47* 50* 33  ALT  --  37 34 36 27  ALKPHOS  --  333* 322* 344* 261*  BILITOT  --  1.4* 0.9 0.8 1.0  PROT  --  6.6 6.6 7.2 6.7  ALBUMIN 1.9* 1.7* 1.7* 1.9* 1.6*   No results for input(s): LIPASE, AMYLASE in the last 168 hours. No results for input(s): AMMONIA in the last 168 hours. CBC: Recent Labs  Lab 08/27/20 0742 08/28/20 0510 08/29/20  0308 08/30/20 0907 08/31/20 0419  WBC 28.7* 26.9* 28.8* 33.9* 29.6*  NEUTROABS 22.9* 21.8* 24.2* 27.8* 23.0*  HGB 9.3* 9.3* 10.3* 9.4* 8.7*  HCT 28.2* 28.8* 32.2* 29.3* 27.7*  MCV 81.3 81.4 81.9 83.0 82.9  PLT 489* 488* 504* 487* 509*   Cardiac Enzymes: No results for input(s): CKTOTAL, CKMB, CKMBINDEX, TROPONINI in the last 168 hours. BNP (last 3 results) Recent Labs    06/22/2020 1922  BNP 135.0*    ProBNP (last 3 results) No results for input(s): PROBNP in the last 8760 hours.  CBG: Recent Labs  Lab 08/30/20 1141 08/30/20 1541 08/30/20 1947 08/31/20 0002 08/31/20 0412  GLUCAP 178* 173* 153* 176* 180*       Studies: No results found.  Scheduled Meds:  amiodarone  200 mg Per Tube Daily   apixaban  5 mg Per Tube BID   buPROPion  75 mg Per Tube BID   chlorhexidine gluconate (MEDLINE KIT)  15 mL Mouth Rinse BID   Chlorhexidine Gluconate Cloth  6 each Topical Q0600   darbepoetin (ARANESP) injection - NON-DIALYSIS  40 mcg Subcutaneous Q Sun-1800   feeding supplement (PROSource TF)  45 mL Per Tube TID   guaiFENesin  5 mL Per Tube BID   insulin aspart  0-9 Units Subcutaneous Q4H   insulin glargine-yfgn  25 Units Subcutaneous Daily   mouth rinse  15 mL Mouth  Rinse 10 times per day   midodrine  5 mg Per Tube Q8H   multivitamin  1 tablet Per Tube QHS   pantoprazole sodium  40 mg Per Tube QHS   sertraline  100 mg Per Tube Daily   sodium chloride flush  10-40 mL Intracatheter Q12H   Continuous Infusions:  sodium chloride Stopped (07/26/20 0824)   sodium chloride 250 mL (08/28/20 2029)   ampicillin-sulbactam (UNASYN) IV Stopped (08/30/20 2202)   feeding supplement (NEPRO CARB STEADY) 1,000 mL (08/30/20 2131)    Active Problems:   Septic shock (HCC)   Pressure injury of skin   Acute respiratory failure with hypoxia (HCC)   AKI (acute kidney injury) ([place])   Hypotension   Goals of care, counseling/discussion   Quadriplegia ([place])   Chronic kidney disease requiring chronic dialysis ([place])   Hyperglycemia   Sepsis due to Acinetobacter baumannii ([place])   Pneumonia due to Acinetobacter species ([place])   Dysautonomia ([place])   Consultants: PCCM Nephrology Infectious disease Palliative care medicine  Procedures: Echocardiogram Insertion of double-lumen HD catheter  Antibiotics: Cefepime x1 dose Meropenem 6/29 through 7/2 Vancomycin 6/29 through 6/30 Unasyn 7/2 through 7/15 Unasyn 8/15 >   Time spent: 35 minutes    Erin Hearing ANP  [facility] 7 am - 330 pm/M-F for direct patient care and secure chat Please refer to Amion for contact info 51  days

## 2020-08-31 NOTE — Progress Notes (Signed)
  PROGRESS NOTE Luis Orr  [id number] DOB: 15-Dec-1950 DOA: 06/13/2020 PCP: Townsend Roger, MD   Subjective: Seen and examined.  Nursing at bedside.  Had oozing from trach site and will [place] this morning Colostomy with stool liquidy Remains on vent, liter was removed with dialysis today Bp stable  Objective: Vitals with BMI 08/31/2020 08/31/2020 08/31/2020  Height - - -  Weight - - 164 lbs 14 oz  BMI - - -  Systolic [id number]  Diastolic 76 60 80  Pulse 86 101 101   On exam he is alert awake nonverbal, not in distress Lungs are clear bilaterally Abdomen with and tired, colostomy with brown stool Generalized edema + Pressure ulcer w/ dressing in place in heel Central line/HD access+  Assessment & Plan:  Septic Shock resolved VAP/Recurrent Pneumonia due to Acinetobacter baumannii. Likely aspiration Resp culture +, based on C/S- cont Unasyn x 5days per ID.leukocytosis peaked at 33.9, now downtrending  Acute kidney injury -prolonged dialysis dependence-on HD. Managed by nephrology. Chronic respiratory failure s/p trach-vent dependent-Pccm following cont vent support. PAF-on amio and eliquis-holding due to trach site bleeding. S/p Colostomy S/p PEG on tube feed Acute metabolic encephalopathy-nonverbal difficult to assess mental status but alert awake Compressive myelopathy with functional quadriplegia Chronic anemia/anemia of chronic disease-h/h stable, on aranesp Diabetes-last A1c 6.3 06/14/2020 blood sugar stable  Hypertension-soft BP: on midodrine 5 mg q8hr Anxiety/depression on Wellbutrin Zoloft  GI prophylaxis with PPI DVT prophylaxis SCD, holding Eliquis due to tracheostomy site oozing  Being monitored closely followed by ICU team, nephrology. Discussed with NP today.  GOC- Partial code. Pt nodded yes to continue dialysis.  Status is: Inpatient Medications reviewed:  Antimicrobials: IV Unasyn LOS: 68 days   Antonieta Pert, MD [facility]  08/31/2020, 3:31 PM

## 2020-08-31 NOTE — Progress Notes (Signed)
[facility] NEPHROLOGY PROGRESS NOTE  Assessment/ Plan:  # Acute kidney Injury-prolonged dialysis dependency: Previous creatinine of around 0.4 and suffered dense ATN from septic shock-without any evidence of renal recovery to date-likely now end-stage renal disease (renal replacement therapy started 7/6).  Initially on CRRT and now on intermittent hemodialysis.  Receiving dialysis today.  Goal UF around 2.5 L.  # Septic shock/ventilator associated pneumonia due to recurrent Acinetobacter infection: Hemodynamically stable off pressors.  Currently on Unasyn and vent support.  #  Chronic respiratory failure: Status post tracheostomy with ventilator management per CCM.  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  # Chronic kidney disease/metabolic bone disease: Calcium and phosphorus level under decent control with hemodialysis, off binders  # Anemia: contiue ESA, monitor Hb.   # Acute metabolic encephalopathy: Continue management as above.,  Supportive care.  Subjective: Seen and examined in ICU.  Tolerating dialysis well.  Review of system limited.    Objective Vital signs in last 24 hours: Vitals:   08/31/20 0730 08/31/20 0735 08/31/20 0745 08/31/20 0800  BP: 110/80  94/69 92/67  Pulse: 82 83 86 89  Resp: 20 20 20 18   Temp:      TempSrc:      SpO2: 99% 97% 97% 97%  Weight:      Height:       Weight change: 0.1 kg  Intake/Output Summary (Last 24 hours) at 08/31/2020 0808 Last data filed at 08/31/2020 0100 Gross per 24 hour  Intake 615 ml  Output --  Net 615 ml       Labs: Basic Metabolic Panel: Recent Labs  Lab 08/25/20 1348 08/26/20 0314 08/26/20 0925 08/29/20 0308 08/30/20 0907 08/31/20 0419  NA 130* 129*   < > 132* 135 133*  K 5.3* 4.8   < > 3.6 4.2 4.2  CL 92* 90*   < > 96* 98 96*  CO2 21* 23   < > 26 25 23    GLUCOSE 169* 122*   < > 195* 220* 181*  BUN 135* 145*   < > 53* 75* 92*  CREATININE 2.02* 2.15*   < > 1.02 1.36* 1.69*  CALCIUM 9.2 9.2   < > 9.1 9.3 9.6  PHOS 4.9* 4.3  --   --   --   --    < > = values in this interval not displayed.   Liver Function Tests: Recent Labs  Lab 08/28/20 0510 08/29/20 0308 08/31/20 0419  AST 47* 50* 33  ALT 34 36 27  ALKPHOS 322* 344* 261*  BILITOT 0.9 0.8 1.0  PROT 6.6 7.2 6.7  ALBUMIN 1.7* 1.9* 1.6*   No results for input(s): LIPASE, AMYLASE in the last 168 hours. No results for input(s): AMMONIA in the last 168 hours. CBC: Recent Labs  Lab 08/27/20 0742 08/28/20 0510 08/29/20 0308 08/30/20 0907 08/31/20 0419  WBC 28.7* 26.9* 28.8* 33.9* 29.6*  NEUTROABS 22.9* 21.8* 24.2* 27.8* 23.0*  HGB 9.3* 9.3* 10.3* 9.4* 8.7*  HCT 28.2* 28.8* 32.2* 29.3* 27.7*  MCV 81.3 81.4 81.9 83.0 82.9  PLT 489* 488* 504* 487* 509*   Cardiac Enzymes: No results for input(s): CKTOTAL, CKMB, CKMBINDEX, TROPONINI in the last 168 hours. CBG: Recent Labs  Lab 08/30/20 1541 08/30/20 1947 08/31/20 0002 08/31/20 0412 08/31/20 0754  GLUCAP 173* 153* 176* 180* 140*    Iron Studies: No results for input(s):  IRON, TIBC, TRANSFERRIN, FERRITIN in the last 72 hours. Studies/Results: No results found.  Medications: Infusions:  sodium chloride Stopped (07/26/20 0824)   sodium chloride 250 mL (08/28/20 2029)   ampicillin-sulbactam (UNASYN) IV Stopped (08/30/20 2202)   feeding supplement (NEPRO CARB STEADY) 1,000 mL (08/30/20 2131)    Scheduled Medications:  amiodarone  200 mg Per Tube Daily   apixaban  5 mg Per Tube BID   buPROPion  75 mg Per Tube BID   chlorhexidine gluconate (MEDLINE KIT)  15 mL Mouth Rinse BID   Chlorhexidine Gluconate Cloth  6 each Topical Q0600   darbepoetin (ARANESP) injection - NON-DIALYSIS  40 mcg Subcutaneous Q Sun-1800   feeding supplement (PROSource TF)  45 mL Per Tube TID   guaiFENesin  5 mL Per Tube BID   insulin aspart  0-9  Units Subcutaneous Q4H   insulin glargine-yfgn  25 Units Subcutaneous Daily   mouth rinse  15 mL Mouth Rinse 10 times per day   midodrine  5 mg Per Tube Q8H   multivitamin  1 tablet Per Tube QHS   pantoprazole sodium  40 mg Per Tube QHS   sertraline  100 mg Per Tube Daily   sodium chloride flush  10-40 mL Intracatheter Q12H    have reviewed scheduled and prn medications.  Physical Exam: General:NAD, comfortable on ventilator via tracheostomy but really not following commands Heart:RRR, s1s2 nl Lungs: Coarse breath sounds bilaterally, no wheezing Abdomen:soft, Non-tender, PEG tube and ostomy bag Extremities:No edema Dialysis Access: Right IJ TDC  Luis Orr 08/31/2020,8:08 AM  LOS: 51 days

## 2020-09-01 ENCOUNTER — Inpatient Hospital Stay (HOSPITAL_COMMUNITY): Payer: 59

## 2020-09-01 DIAGNOSIS — J158 Pneumonia due to other specified bacteria: Secondary | ICD-10-CM | POA: Diagnosis not present

## 2020-09-01 DIAGNOSIS — N179 Acute kidney failure, unspecified: Secondary | ICD-10-CM | POA: Diagnosis not present

## 2020-09-01 DIAGNOSIS — J9601 Acute respiratory failure with hypoxia: Secondary | ICD-10-CM | POA: Diagnosis not present

## 2020-09-01 DIAGNOSIS — N186 End stage renal disease: Secondary | ICD-10-CM | POA: Diagnosis not present

## 2020-09-01 LAB — GLUCOSE, CAPILLARY
Glucose-Capillary: 104 mg/dL — ABNORMAL HIGH (ref 70–99)
Glucose-Capillary: 108 mg/dL — ABNORMAL HIGH (ref 70–99)
Glucose-Capillary: 139 mg/dL — ABNORMAL HIGH (ref 70–99)
Glucose-Capillary: 144 mg/dL — ABNORMAL HIGH (ref 70–99)
Glucose-Capillary: 150 mg/dL — ABNORMAL HIGH (ref 70–99)
Glucose-Capillary: 90 mg/dL (ref 70–99)

## 2020-09-01 LAB — CBC
HCT: 26.9 % — ABNORMAL LOW (ref 39.0–52.0)
Hemoglobin: 8.7 g/dL — ABNORMAL LOW (ref 13.0–17.0)
MCH: 26.6 pg (ref 26.0–34.0)
MCHC: 32.3 g/dL (ref 30.0–36.0)
MCV: 82.3 fL (ref 80.0–100.0)
Platelets: 478 10*3/uL — ABNORMAL HIGH (ref 150–400)
RBC: 3.27 MIL/uL — ABNORMAL LOW (ref 4.22–5.81)
RDW: 20.3 % — ABNORMAL HIGH (ref 11.5–15.5)
WBC: 27.8 10*3/uL — ABNORMAL HIGH (ref 4.0–10.5)
nRBC: 1.2 % — ABNORMAL HIGH (ref 0.0–0.2)

## 2020-09-01 LAB — BASIC METABOLIC PANEL
Anion gap: 13 (ref 5–15)
BUN: 52 mg/dL — ABNORMAL HIGH (ref 8–23)
CO2: 26 mmol/L (ref 22–32)
Calcium: 9 mg/dL (ref 8.9–10.3)
Chloride: 93 mmol/L — ABNORMAL LOW (ref 98–111)
Creatinine, Ser: 1.11 mg/dL (ref 0.61–1.24)
GFR, Estimated: >60 mL/min (ref >60)
Glucose, Bld: 152 mg/dL — ABNORMAL HIGH (ref 70–99)
Potassium: 3.3 mmol/L — ABNORMAL LOW (ref 3.5–5.1)
Sodium: 132 mmol/L — ABNORMAL LOW (ref 135–145)

## 2020-09-01 LAB — PROCALCITONIN: Procalcitonin: 4.42 ng/mL

## 2020-09-01 MED ORDER — DIATRIZOATE MEGLUMINE & SODIUM 66-10 % PO SOLN
ORAL | Status: AC
  Filled 2020-09-01: qty 30

## 2020-09-01 MED ORDER — PANCRELIPASE (LIP-PROT-AMYL) 10440-39150 UNITS PO TABS
20880.0000 [IU] | ORAL_TABLET | Freq: Once | ORAL | Status: AC
  Administered 2020-09-01: 20880 [IU]
  Filled 2020-09-01 (×2): qty 2

## 2020-09-01 MED ORDER — SODIUM BICARBONATE 650 MG PO TABS: 650.0000 mg | ORAL_TABLET | Freq: Once | ORAL | Status: DC

## 2020-09-01 MED ORDER — PANCRELIPASE (LIP-PROT-AMYL) 10440-39150 UNITS PO TABS
20880.0000 [IU] | ORAL_TABLET | Freq: Once | ORAL | Status: AC
  Administered 2020-09-01: 20880 [IU]
  Filled 2020-09-01: qty 2

## 2020-09-01 MED ORDER — SODIUM BICARBONATE 650 MG PO TABS
650.0000 mg | ORAL_TABLET | Freq: Once | ORAL | Status: AC
  Administered 2020-09-01: 650 mg
  Filled 2020-09-01: qty 1

## 2020-09-01 NOTE — Progress Notes (Signed)
Interventional Radiology Brief Note:  20Fr balloon-retention G-tube successfully declogged at bedside.  Flushes easily.  May resume use.  Dressing replaced.   Recommend frequent flushes.   Brynda Greathouse, MS RD PA-C

## 2020-09-01 NOTE — Progress Notes (Signed)
RT NOTE: patient is chronic trach/vent.  Tolerating current ventilator settings well at this time.  Will continue to monitor.

## 2020-09-01 NOTE — Progress Notes (Signed)
[facility] NEPHROLOGY PROGRESS NOTE  Assessment/ Plan:  # Acute kidney Injury-prolonged dialysis dependency: Previous creatinine of around 0.4 and suffered dense ATN from septic shock-without any evidence of renal recovery to date-likely now end-stage renal disease (renal replacement therapy started 7/6).  Initially on CRRT and now on intermittent hemodialysis.  Last HD on 8/19 with around 500 cc UF, tolerated well.  Remains anuric.  Plan for next HD on 8/22.    # Septic shock/ventilator associated pneumonia due to recurrent Acinetobacter infection: Hemodynamically stable off pressors.  Currently on Unasyn and vent support.  #  Chronic respiratory failure: Status post tracheostomy with ventilator management per CCM.  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  # Chronic kidney disease/metabolic bone disease: Calcium and phosphorus level under decent control with hemodialysis, off binders  # Anemia: contiue ESA, monitor Hb.   # Acute metabolic encephalopathy: Continue management as above.,  Supportive care.  #Mild hypokalemia: He is on tube feed.  We will manage with high potassium bath.  Subjective: Seen and examined in ICU.  He had dialysis yesterday and tolerated well.  No new event.  Denies nausea vomiting chest pain shortness of breath.  Objective Vital signs in last 24 hours: Vitals:   09/01/20 0500 09/01/20 0600 09/01/20 0700 09/01/20 0811  BP: 101/61 113/70 104/71   Pulse: 80 83 80   Resp: _0 Temp:    97.9 F (36.6 C)  TempSrc:    Oral  SpO2: 99% 99% 98%   Weight: 80.1 kg     Height:       Weight change: -3.1 kg  Intake/Output Summary (Last 24 hours) at 09/01/2020 0840 Last data filed at 09/01/2020 0500 Gross per 24 hour  Intake 1405 ml  Output 700 ml  Net 705 ml        Labs: Basic Metabolic Panel: Recent  Labs  Lab 08/25/20 1348 08/26/20 0314 08/26/20 0925 08/30/20 0907 08/31/20 0419 09/01/20 0428  NA 130* 129*   < > 135 133* 132*  K 5.3* 4.8   < > 4.2 4.2 3.3*  CL 92* 90*   < > 98 96* 93*  CO2 21* 23   < > _1 GLUCOSE 169* 122*   < > 220* 181* 152*  BUN 135* 145*   < > 75* 92* 52*  CREATININE 2.02* 2.15*   < > 1.36* 1.69* 1.11  CALCIUM 9.2 9.2   < > 9.3 9.6 9.0  PHOS 4.9* 4.3  --   --   --   --    < > = values in this interval not displayed.    Liver Function Tests: Recent Labs  Lab 08/28/20 0510 08/29/20 0308 08/31/20 0419  AST 47* 50* 33  ALT 34 36 27  ALKPHOS 322* 344* 261*  BILITOT 0.9 0.8 1.0  PROT 6.6 7.2 6.7  ALBUMIN 1.7* 1.9* 1.6*    No results for input(s): LIPASE, AMYLASE in the last 168 hours. No results for input(s): AMMONIA in the last 168 hours. CBC: Recent Labs  Lab 08/28/20 0510 08/29/20 0308 08/30/20 0907 08/31/20 0419 09/01/20 0428  WBC 26.9* 28.8* 33.9* 29.6* 27.8*  NEUTROABS 21.8* 24.2* 27.8* 23.0*  --   HGB 9.3* 10.3* 9.4* 8.7* 8.7*  HCT 28.8* 32.2* 29.3* 27.7* 26.9*  MCV 81.4 81.9 83.0 82.9 82.3  PLT 488* 504* 487*  509* 478*    Cardiac Enzymes: No results for input(s): CKTOTAL, CKMB, CKMBINDEX, TROPONINI in the last 168 hours. CBG: Recent Labs  Lab 08/31/20 1517 08/31/20 1948 08/31/20 2331 09/01/20 0334 09/01/20 0809  GLUCAP 174* 140* 144* 144* 108*     Iron Studies: No results for input(s): IRON, TIBC, TRANSFERRIN, FERRITIN in the last 72 hours. Studies/Results: No results found.  Medications: Infusions:  sodium chloride Stopped (07/26/20 0824)   sodium chloride 250 mL (08/28/20 2029)   ampicillin-sulbactam (UNASYN) IV Stopped (08/31/20 2235)   feeding supplement (NEPRO CARB STEADY) Stopped (09/01/20 0557)    Scheduled Medications:  amiodarone  200 mg Per Tube Daily   buPROPion  75 mg Per Tube BID   chlorhexidine gluconate (MEDLINE KIT)  15 mL Mouth Rinse BID   Chlorhexidine Gluconate Cloth  6 each Topical  Q0600   darbepoetin (ARANESP) injection - NON-DIALYSIS  40 mcg Subcutaneous Q Sun-1800   feeding supplement (PROSource TF)  45 mL Per Tube TID   guaiFENesin  5 mL Per Tube BID   insulin aspart  0-9 Units Subcutaneous Q4H   insulin glargine-yfgn  25 Units Subcutaneous Daily   lipase/protease/amylase)  20,880 Units Per Tube Once   And   sodium bicarbonate  650 mg Per Tube Once   mouth rinse  15 mL Mouth Rinse 10 times per day   midodrine  5 mg Per Tube Q8H   multivitamin  1 tablet Per Tube QHS   pantoprazole sodium  40 mg Per Tube QHS   sertraline  100 mg Per Tube Daily   sodium chloride flush  10-40 mL Intracatheter Q12H    have reviewed scheduled and prn medications.  Physical Exam: General:NAD, comfortable on ventilator via tracheostomy, alert and following commands.  Heart:RRR, s1s2 nl Lungs: Coarse breath sounds bilaterally, no wheezing Abdomen:soft, Non-tender, PEG tube and ostomy bag Extremities:No edema Dialysis Access: Right IJ TDC  Luis Orr 09/01/2020,8:40 AM  LOS: 52 days

## 2020-09-01 NOTE — Progress Notes (Signed)
Pharmacy Antibiotic Note  Luis Orr is a 70 y.o. male admitted on 06/28/2020 with concern for worsening septic shock/AMS. PMH significant for quadriplegia, chronic trach/vent, ESRD now iHD.  Patient is from [place] and has a history of multi-drug resistant organisms such as ESBL.  Notably, this admission patient completed a course of unasyn for Acinetobacter baumanii which was resistant to everything other than unasyn. Pharmacy consulted 8/15 for restart of unasyn d/t worsening leukocytosis. 8/17 TA shows re-emergence of abundant Acinetobacter baumanii requiring dose-adjustment of unasyn.    Plan: Continue Unasyn 3g IV Q12h   Height: '5\' 6"'$  (167.6 cm) Weight: 80.1 kg (176 lb 9.4 oz) IBW/kg (Calculated) : 63.8  Temp (24hrs), Avg:98.3 F (36.8 C), Min:97.6 F (36.4 C), Max:99 F (37.2 C)  Recent Labs  Lab 08/27/20 1211 08/28/20 0510 08/29/20 0308 08/30/20 0907 08/31/20 0419 09/01/20 0428  WBC  --  26.9* 28.8* 33.9* 29.6* 27.8*  CREATININE  --  1.69* 1.02 1.36* 1.69* 1.11  LATICACIDVEN 1.7  --   --   --   --   --      Estimated Creatinine Clearance: 61.6 mL/min (by C-G formula based on SCr of 1.11 mg/dL).    Allergies  Allergen Reactions   Adhesive [Tape] Rash    Antimicrobials this admission: Cefepime 6/29 x1 Merrem 6/30>>7/2 Vancomycin 6/29>>7/1, 7/12>>7/14 Unasyn 7/2>>7/15 Unasyn 8/15>>8/17>>   Microbiology results: 8/15 Sputum: Abundant Acinetobacter baumanii  8/14 Bcx: 1/3 staph epi  7/12 BCx: negative  7/1 UCx: multiple species   6/30 Sputum: acinetobacter baumannii - sens to Unasyn only 6/29 MRSA PCR: positive  Thank you for allowing pharmacy to be a part of this patient's care.  Alanda Slim, PharmD, [facility] Clinical Pharmacist Please see AMION for all Pharmacists' Contact Phone Numbers 09/01/2020, 8:23 AM

## 2020-09-01 NOTE — Progress Notes (Signed)
PROGRESS NOTE  Luis Orr:[id number] DOB: Dec 01, 1950 DOA: 07/07/2020 PCP: Townsend Roger, MD  HPI/Recap of past 24 hours: This is a 70 year old male quadriplegia, chronic tracheostomy, chronic PEG tube chronic Foley bedbound type 2 diabetes mellitus protein calorie malnutrition anemia of chronic disease who was admitted 29 2022 with hypotension multifocal pneumonia with acute on chronic respiratory failure and acute encephalopathy and septic shock with respiratory failure as well as new onset stage III renal failure patient was started on hemodialysis.  September 01, 2020: Patient seen and examined at bedside in ICU.  Nurse had reported that the PEG tube was clogged and interventional radiology was consulted and they unclog the tube  Assessment/Plan: Active Problems:   Septic shock (HCC)   Pressure injury of skin   Acute respiratory failure with hypoxia (HCC)   AKI (acute kidney injury) ([place])   Hypotension   Goals of care, counseling/discussion   Quadriplegia ([place])   Chronic kidney disease requiring chronic dialysis ([place])   Hyperglycemia   Sepsis due to Acinetobacter baumannii ([place])   Pneumonia due to Acinetobacter species ([place])   Dysautonomia ([place])  1.  Septic shock is resolved Recurrent pneumonia due to ascending Acinetobacter likely from aspiration Respiratory cultures positive Patient is on Unasyn and he was continued for 5 days based on culture and sensitivity.  Infectious disease Leukocytosis trending down now  2.  Acute kidney injury prolonged dialysis dependence on hemodialysis this is managed by nephrology  3.  Chronic respiratory failure status post trach/vent dependent PCCM following Continue vent support and trach care  4.  Status post PEG feeding.  Nurse had noted that PEG tube was clogged this morning and could not receive his medicine of interventional radiology was consulted and they were able to unclog PEG tube  Status post colostomy  6.  Acute  metabolic encephalopathy.  Patient is nonverbal but is difficult to assess mental status is however awake and acknowledges when spoken to chronic anemia of chronic disease patient is on Aranesp  7.  Diabetes mellitus.  His blood sugar is stable his last hemoglobin A1c was 6.3 in June 2022  8.  Hypertension blood pressure is tolerable continue midodrine 5 mg every 8 hours  9.  Anxiety/depression continue Wellbutrin and Zoloft    Code Status: Partial code  Severity of Illness: The appropriate patient status for this patient is INPATIENT. Inpatient status is judged to be reasonable and necessary in order to provide the required intensity of service to ensure the patient's safety. The patient's presenting symptoms, physical exam findings, and initial radiographic and laboratory data in the context of their chronic comorbidities is felt to place them at high risk for further clinical deterioration. Furthermore, it is not anticipated that the patient will be medically stable for discharge from the hospital within 2 midnights of admission. The following factors support the patient status of inpatient.   " Critically ill patient tracheostomy.  Multiple medical issues  * I certify that at the point of admission it is my clinical judgment that the patient will require inpatient hospital care spanning beyond 2 midnights from the point of admission due to high intensity of service, high risk for further deterioration and high frequency of surveillance required.*   Family Communication: None at bedside  Disposition Plan: To be determined Status is: Inpatient   Dispo: The patient is from: Home              Anticipated d/c is to:  Anticipated d/c date is:               Patient currently not medically stable for discharge  Consultants: Interventional radiology Nephrology Critical care Pulmonary Dietitian   Procedures: Tracheostomy  Antimicrobials: Unasyn  DVT  prophylaxis: Eliquis but this is on hold since yesterday due to blood oozing around tracheostomy site Currently on SCD   Objective: Vitals:   09/01/20 0600 09/01/20 0700 09/01/20 0811 09/01/20 0814  BP: 113/70 104/71  107/78  Pulse: 83 80  85  Resp: 20 20  20   Temp:   97.9 F (36.6 C)   TempSrc:   Oral   SpO2: 99% 98%  100%  Weight:      Height:        Intake/Output Summary (Last 24 hours) at 09/01/2020 1009 Last data filed at 09/01/2020 0500 Gross per 24 hour  Intake 1255 ml  Output 700 ml  Net 555 ml   Filed Weights   08/31/20 0715 08/31/20 1015 09/01/20 0500  Weight: 75.7 kg 74.8 kg 80.1 kg   Body mass index is 28.5 kg/m.  Exam:  General: 70 y.o. year-old male well developed well nourished in no acute distress.  Alert and slight nodding to questions Tracheostomy tube in place with ventilation support Cardiovascular: Regular rate and rhythm with no rubs or gallops.  No thyromegaly or JVD noted.   Respiratory: Clear to auscultation with no wheezes or rales. Good inspiratory effort. Abdomen: Soft nontender nondistended with normal bowel sounds x4 quadrants..  GT site is clean Musculoskeletal: Plus lower extremity edema. 2/4 pulses in all 4 extremities. Skin: Pressure ulcer noted on the heel dressing in place, Psychiatry: Mood is appropriate for condition and setting Neurology: Unable to fully assess.  Patient is a awake, acknowledges by nodding or moving slightly head to side to side    Data Reviewed: CBC: Recent Labs  Lab 08/27/20 0742 08/28/20 0510 08/29/20 0308 08/30/20 0907 08/31/20 0419 09/01/20 0428  WBC 28.7* 26.9* 28.8* 33.9* 29.6* 27.8*  NEUTROABS 22.9* 21.8* 24.2* 27.8* 23.0*  --   HGB 9.3* 9.3* 10.3* 9.4* 8.7* 8.7*  HCT 28.2* 28.8* 32.2* 29.3* 27.7* 26.9*  MCV 81.3 81.4 81.9 83.0 82.9 82.3  PLT 489* 488* 504* 487* 509* 817*   Basic Metabolic Panel: Recent Labs  Lab 08/25/20 1348 08/26/20 0314 08/26/20 0925 08/27/20 0742 08/28/20 0510  08/29/20 0308 08/30/20 0907 08/31/20 0419 09/01/20 0428  NA 130* 129*   < > 132* 131* 132* 135 133* 132*  K 5.3* 4.8   < > 4.1 4.0 3.6 4.2 4.2 3.3*  CL 92* 90*   < > 94* 93* 96* 98 96* 93*  CO2 21* 23   < > 25 24 26 25 23 26   GLUCOSE 169* 122*   < > 76 164* 195* 220* 181* 152*  BUN 135* 145*   < > 70* 86* 53* 75* 92* 52*  CREATININE 2.02* 2.15*   < > 1.42* 1.69* 1.02 1.36* 1.69* 1.11  CALCIUM 9.2 9.2   < > 8.9 9.1 9.1 9.3 9.6 9.0  MG  --   --   --  2.1  --   --   --   --   --   PHOS 4.9* 4.3  --   --   --   --   --   --   --    < > = values in this interval not displayed.   GFR: Estimated Creatinine Clearance: 61.6 mL/min (by C-G formula  based on SCr of 1.11 mg/dL). Liver Function Tests: Recent Labs  Lab 08/26/20 0314 08/27/20 0742 08/28/20 0510 08/29/20 0308 08/31/20 0419  AST  --  53* 47* 50* 33  ALT  --  37 34 36 27  ALKPHOS  --  333* 322* 344* 261*  BILITOT  --  1.4* 0.9 0.8 1.0  PROT  --  6.6 6.6 7.2 6.7  ALBUMIN 1.9* 1.7* 1.7* 1.9* 1.6*   No results for input(s): LIPASE, AMYLASE in the last 168 hours. No results for input(s): AMMONIA in the last 168 hours. Coagulation Profile: No results for input(s): INR, PROTIME in the last 168 hours. Cardiac Enzymes: No results for input(s): CKTOTAL, CKMB, CKMBINDEX, TROPONINI in the last 168 hours. BNP (last 3 results) No results for input(s): PROBNP in the last 8760 hours. HbA1C: No results for input(s): HGBA1C in the last 72 hours. CBG: Recent Labs  Lab 08/31/20 1517 08/31/20 1948 08/31/20 2331 09/01/20 0334 09/01/20 0809  GLUCAP 174* 140* 144* 144* 108*   Lipid Profile: No results for input(s): CHOL, HDL, LDLCALC, TRIG, CHOLHDL, LDLDIRECT in the last 72 hours. Thyroid Function Tests: No results for input(s): TSH, T4TOTAL, FREET4, T3FREE, THYROIDAB in the last 72 hours. Anemia Panel: No results for input(s): VITAMINB12, FOLATE, FERRITIN, TIBC, IRON, RETICCTPCT in the last 72 hours. Urine analysis:    Component  Value Date/Time   COLORURINE YELLOW 07/25/2020 0334   APPEARANCEUR CLEAR 07/25/2020 0334   LABSPEC 1.020 07/25/2020 0334   PHURINE 6.5 07/25/2020 0334   GLUCOSEU 100 (A) 07/25/2020 0334   HGBUR LARGE (A) 07/25/2020 0334   BILIRUBINUR SMALL (A) 07/25/2020 0334   KETONESUR NEGATIVE 07/25/2020 0334   PROTEINUR >300 (A) 07/25/2020 0334   NITRITE NEGATIVE 07/25/2020 0334   LEUKOCYTESUR MODERATE (A) 07/25/2020 0334   Sepsis Labs: @LABRCNTIP (procalcitonin:4,lacticidven:4)  ) Recent Results (from the past 240 hour(s))  MRSA Next Gen by PCR, Nasal     Status: None   Collection Time: 08/23/20  4:17 PM   Specimen: Nasal Mucosa; Nasal Swab  Result Value Ref Range Status   MRSA by PCR Next Gen NOT DETECTED NOT DETECTED Final    Comment: (NOTE) The GeneXpert MRSA Assay (FDA approved for NASAL specimens only), is one component of a comprehensive MRSA colonization surveillance program. It is not intended to diagnose MRSA infection nor to guide or monitor treatment for MRSA infections. Test performance is not FDA approved in patients less than 30 years old. Performed at [facility], [place] [address], [place], [place] [address]   Culture, blood (routine x 2)     Status: None   Collection Time: 08/26/20  9:32 AM   Specimen: BLOOD RIGHT HAND  Result Value Ref Range Status   Specimen Description BLOOD RIGHT HAND  Final   Special Requests   Final    BOTTLES DRAWN AEROBIC ONLY Blood Culture results may not be optimal due to an inadequate volume of blood received in culture bottles   Culture   Final    NO GROWTH 5 DAYS Performed at [facility], [place] [address], [place], [place] [address]    Report Status 08/31/2020 FINAL  Final  Culture, blood (routine x 2)     Status: Abnormal   Collection Time: 08/26/20  9:38 AM   Specimen: BLOOD LEFT HAND  Result Value Ref Range Status   Specimen Description BLOOD LEFT HAND  Final   Special Requests   Final    BOTTLES DRAWN AEROBIC AND  ANAEROBIC  Blood Culture adequate volume   Culture  Setup Time   Final    GRAM POSITIVE COCCI IN CLUSTERS AEROBIC BOTTLE ONLY CRITICAL RESULT CALLED TO, READ BACK BY AND VERIFIED WITH: PHARM D A.LAWLESS ON [id number] AT 0945 BY E.PARRISH    Culture (A)  Final    STAPHYLOCOCCUS EPIDERMIDIS THE SIGNIFICANCE OF ISOLATING THIS ORGANISM FROM A SINGLE SET OF BLOOD CULTURES WHEN MULTIPLE SETS ARE DRAWN IS UNCERTAIN. PLEASE NOTIFY THE MICROBIOLOGY DEPARTMENT WITHIN ONE WEEK IF SPECIATION AND SENSITIVITIES ARE REQUIRED. Performed at [facility], [place] [address] [place], [place] [address]    Report Status 08/29/2020 FINAL  Final  Culture, Respiratory w Gram Stain     Status: None   Collection Time: 08/27/20  9:15 AM   Specimen: Tracheal Aspirate; Respiratory  Result Value Ref Range Status   Specimen Description TRACHEAL ASPIRATE  Final   Special Requests NONE  Final   Gram Stain   Final    ABUNDANT WBC PRESENT, PREDOMINANTLY PMN FEW GRAM POSITIVE COCCI FEW GRAM NEGATIVE RODS RARE GRAM POSITIVE RODS    Culture   Final    ABUNDANT ACINETOBACTER BAUMANNII MULTI-DRUG RESISTANT ORGANISM CRITICAL RESULT CALLED TO, READ BACK BY AND VERIFIED WITH: PHARMD K MCCLURE [id number] AT 1101 AM BY CM Performed at [facility], [place] [address], [place], [place] [address]    Report Status 08/30/2020 FINAL  Final   Organism ID, Bacteria ACINETOBACTER BAUMANNII  Final      Susceptibility   Acinetobacter baumannii - MIC*    CEFTAZIDIME 16 INTERMEDIATE Intermediate     CIPROFLOXACIN >=4 RESISTANT Resistant     GENTAMICIN >=16 RESISTANT Resistant     IMIPENEM >=16 RESISTANT Resistant     PIP/TAZO >=128 RESISTANT Resistant     TRIMETH/SULFA >=320 RESISTANT Resistant     AMPICILLIN/SULBACTAM 4 SENSITIVE Sensitive     * ABUNDANT ACINETOBACTER BAUMANNII      Studies: No results found.  Scheduled Meds:  amiodarone  200 mg Per Tube Daily   buPROPion  75 mg Per Tube BID   chlorhexidine gluconate  (MEDLINE KIT)  15 mL Mouth Rinse BID   Chlorhexidine Gluconate Cloth  6 each Topical Q0600   darbepoetin (ARANESP) injection - NON-DIALYSIS  40 mcg Subcutaneous Q Sun-1800   feeding supplement (PROSource TF)  45 mL Per Tube TID   guaiFENesin  5 mL Per Tube BID   insulin aspart  0-9 Units Subcutaneous Q4H   insulin glargine-yfgn  25 Units Subcutaneous Daily   mouth rinse  15 mL Mouth Rinse 10 times per day   midodrine  5 mg Per Tube Q8H   multivitamin  1 tablet Per Tube QHS   pantoprazole sodium  40 mg Per Tube QHS   sertraline  100 mg Per Tube Daily   sodium chloride flush  10-40 mL Intracatheter Q12H    Continuous Infusions:  sodium chloride Stopped (07/26/20 0824)   sodium chloride 250 mL (08/28/20 2029)   ampicillin-sulbactam (UNASYN) IV 3 g (09/01/20 0942)   feeding supplement (NEPRO CARB STEADY) Stopped (09/01/20 0557)     LOS: 55 days     Cristal Deer, MD [facility]  To reach me or the doctor on call, go to: [email or web address] Password [email or web address]  09/01/2020, 10:09 AM

## 2020-09-02 DIAGNOSIS — J9601 Acute respiratory failure with hypoxia: Secondary | ICD-10-CM | POA: Diagnosis not present

## 2020-09-02 DIAGNOSIS — J158 Pneumonia due to other specified bacteria: Secondary | ICD-10-CM | POA: Diagnosis not present

## 2020-09-02 DIAGNOSIS — N179 Acute kidney failure, unspecified: Secondary | ICD-10-CM | POA: Diagnosis not present

## 2020-09-02 LAB — CBC
HCT: 26.9 % — ABNORMAL LOW (ref 39.0–52.0)
Hemoglobin: 8.7 g/dL — ABNORMAL LOW (ref 13.0–17.0)
MCH: 26.5 pg (ref 26.0–34.0)
MCHC: 32.3 g/dL (ref 30.0–36.0)
MCV: 82 fL (ref 80.0–100.0)
Platelets: 463 10*3/uL — ABNORMAL HIGH (ref 150–400)
RBC: 3.28 MIL/uL — ABNORMAL LOW (ref 4.22–5.81)
RDW: 19.9 % — ABNORMAL HIGH (ref 11.5–15.5)
WBC: 24.5 10*3/uL — ABNORMAL HIGH (ref 4.0–10.5)
nRBC: 0.6 % — ABNORMAL HIGH (ref 0.0–0.2)

## 2020-09-02 LAB — GLUCOSE, CAPILLARY
Glucose-Capillary: 163 mg/dL — ABNORMAL HIGH (ref 70–99)
Glucose-Capillary: 211 mg/dL — ABNORMAL HIGH (ref 70–99)
Glucose-Capillary: 222 mg/dL — ABNORMAL HIGH (ref 70–99)
Glucose-Capillary: 190 mg/dL — ABNORMAL HIGH (ref 70–99)
Glucose-Capillary: 190 mg/dL — ABNORMAL HIGH (ref 70–99)

## 2020-09-02 LAB — BASIC METABOLIC PANEL
Anion gap: 13 (ref 5–15)
BUN: 70 mg/dL — ABNORMAL HIGH (ref 8–23)
CO2: 24 mmol/L (ref 22–32)
Calcium: 8.8 mg/dL — ABNORMAL LOW (ref 8.9–10.3)
Chloride: 96 mmol/L — ABNORMAL LOW (ref 98–111)
Creatinine, Ser: 1.48 mg/dL — ABNORMAL HIGH (ref 0.61–1.24)
GFR, Estimated: 51 mL/min — ABNORMAL LOW (ref >60)
Glucose, Bld: 190 mg/dL — ABNORMAL HIGH (ref 70–99)
Potassium: 3.3 mmol/L — ABNORMAL LOW (ref 3.5–5.1)
Sodium: 133 mmol/L — ABNORMAL LOW (ref 135–145)

## 2020-09-02 LAB — PROCALCITONIN: Procalcitonin: 3.61 ng/mL

## 2020-09-02 MED ORDER — DARBEPOETIN ALFA 40 MCG/0.4ML IJ SOSY
40.0000 ug | PREFILLED_SYRINGE | INTRAMUSCULAR | Status: DC
  Filled 2020-09-02 (×2): qty 0.4

## 2020-09-02 MED ORDER — CHLORHEXIDINE GLUCONATE CLOTH 2 % EX PADS
6.0000 | MEDICATED_PAD | Freq: Every day | CUTANEOUS | Status: DC
  Administered 2020-09-02 – 2020-09-04 (×3): 6 via TOPICAL

## 2020-09-02 MED ORDER — POTASSIUM CHLORIDE 20 MEQ PO PACK
40.0000 meq | PACK | Freq: Once | ORAL | Status: AC
  Administered 2020-09-02: 40 meq via ORAL
  Filled 2020-09-02: qty 2

## 2020-09-02 NOTE — Progress Notes (Signed)
[facility] NEPHROLOGY PROGRESS NOTE  Assessment/ Plan:  # Acute kidney Injury-prolonged dialysis dependency: Previous creatinine of around 0.4 and suffered dense ATN from septic shock-without any evidence of renal recovery to date-likely now end-stage renal disease (renal replacement therapy started 7/6).  Initially on CRRT and now on intermittent hemodialysis.  Last HD on 8/19 with around 500 cc UF, tolerated well.  Remains anuric therfore plan for next HD on 8/22.    # Septic shock/ventilator associated pneumonia due to recurrent Acinetobacter infection: Hemodynamically stable off pressors.  Currently on Unasyn and vent support.  #  Chronic respiratory failure: Status post tracheostomy with ventilator management per CCM.  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  # Chronic kidney disease/metabolic bone disease: Calcium and phosphorus level under decent control with hemodialysis, off binders  # Anemia: contiue ESA, monitor Hb.   # Acute metabolic encephalopathy: Continue management as above.,  Supportive care.  #Mild hypokalemia: He is on tube feed.  We will manage with high potassium bath.  Subjective: Seen and examined in ICU.  No urine output.  Remains on vent.  No new event. Objective Vital signs in last 24 hours: Vitals:   09/02/20 0600 09/02/20 0700 09/02/20 0748 09/02/20 0821  BP: (!) 142/82 (!) 148/82  (!) 154/84  Pulse: 73 76  77  Resp: _0 Temp:   98.3 F (36.8 C)   TempSrc:   Axillary   SpO2: 100% 100%  100%  Weight:      Height:       Weight change: 4.2 kg  Intake/Output Summary (Last 24 hours) at 09/02/2020 0935 Last data filed at 09/01/2020 1800 Gross per 24 hour  Intake 280 ml  Output 75 ml  Net 205 ml        Labs: Basic Metabolic Panel: Recent Labs  Lab 08/31/20 0419 09/01/20 0428 09/02/20 0347   NA 133* 132* 133*  K 4.2 3.3* 3.3*  CL 96* 93* 96*  CO2 _1 GLUCOSE 181* 152* 190*  BUN 92* 52* 70*  CREATININE 1.69* 1.11 1.48*  CALCIUM 9.6 9.0 8.8*    Liver Function Tests: Recent Labs  Lab 08/28/20 0510 08/29/20 0308 08/31/20 0419  AST 47* 50* 33  ALT 34 36 27  ALKPHOS 322* 344* 261*  BILITOT 0.9 0.8 1.0  PROT 6.6 7.2 6.7  ALBUMIN 1.7* 1.9* 1.6*    No results for input(s): LIPASE, AMYLASE in the last 168 hours. No results for input(s): AMMONIA in the last 168 hours. CBC: Recent Labs  Lab 08/29/20 0308 08/30/20 0907 08/31/20 0419 09/01/20 0428 09/02/20 0347  WBC 28.8* 33.9* 29.6* 27.8* 24.5*  NEUTROABS 24.2* 27.8* 23.0*  --   --   HGB 10.3* 9.4* 8.7* 8.7* 8.7*  HCT 32.2* 29.3* 27.7* 26.9* 26.9*  MCV 81.9 83.0 82.9 82.3 82.0  PLT 504* 487* 509* 478* 463*    Cardiac Enzymes: No results for input(s): CKTOTAL, CKMB, CKMBINDEX, TROPONINI in the last 168 hours. CBG: Recent Labs  Lab 09/01/20 1541 09/01/20 1936 09/01/20 2336 09/02/20 0329 09/02/20 0742  GLUCAP 90 150* 139* 163* 211*     Iron Studies: No results for input(s): IRON, TIBC, TRANSFERRIN, FERRITIN in the last 72 hours. Studies/Results: DG Abd Portable 1V  Result Date: 09/01/2020 CLINICAL DATA:  Clogged PEG tube. Contrast could not be pushed through the tube. EXAM: PORTABLE ABDOMEN -  1 VIEW COMPARISON:  08/27/2020.  CT, 07/26/2020. FINDINGS: There is contrast within the gastrostomy tube, to the level of the balloon/diaphragm, but none distal to this and none within the stomach. Gastrostomy tube projects in the upper central abdomen adjacent to a single surgical vascular clips, which is similar to its position on the prior radiographs. Normal bowel gas pattern. Upper extent of a nephrostomy tube is stable from the prior exam. There are lung base opacities consistent with a combination of pleural effusions and atelectasis. IMPRESSION: 1. Position of the percutaneous gastrostomy tube is without  convincing change from the prior radiographs. Contrast is within the tube, but does not extend to the most distal aspect of the tube or into the stomach. This is consistent with a tube being clogged as opposed to being displaced Electronically Signed   By: Lajean Manes M.D.   On: 09/01/2020 12:35    Medications: Infusions:  sodium chloride Stopped (07/26/20 0824)   sodium chloride 500 mL (09/01/20 1408)   ampicillin-sulbactam (UNASYN) IV 3 g (09/02/20 0853)   feeding supplement (NEPRO CARB STEADY) 1,000 mL (09/02/20 0845)    Scheduled Medications:  amiodarone  200 mg Per Tube Daily   buPROPion  75 mg Per Tube BID   chlorhexidine gluconate (MEDLINE KIT)  15 mL Mouth Rinse BID   Chlorhexidine Gluconate Cloth  6 each Topical Q0600   darbepoetin (ARANESP) injection - NON-DIALYSIS  40 mcg Subcutaneous Q Sun-1800   feeding supplement (PROSource TF)  45 mL Per Tube TID   guaiFENesin  5 mL Per Tube BID   insulin aspart  0-9 Units Subcutaneous Q4H   insulin glargine-yfgn  25 Units Subcutaneous Daily   mouth rinse  15 mL Mouth Rinse 10 times per day   midodrine  5 mg Per Tube Q8H   multivitamin  1 tablet Per Tube QHS   pantoprazole sodium  40 mg Per Tube QHS   potassium chloride  40 mEq Oral Once   sertraline  100 mg Per Tube Daily   sodium chloride flush  10-40 mL Intracatheter Q12H    have reviewed scheduled and prn medications.  Physical Exam: General:NAD, comfortable on tracheostomy/on vent.  Opens eyes with the name. Heart:RRR, s1s2 nl Lungs: Coarse breath sounds bilaterally, no wheezing Abdomen:soft, Non-tender, PEG tube and ostomy bag Extremities:Trace edema Dialysis Access: Right IJ TDC  [place] Kadlec Prasad Aiko Belko 09/02/2020,9:35 AM  LOS: 53 days

## 2020-09-02 NOTE — Progress Notes (Signed)
PROGRESS NOTE  Luis Orr VAN:[id number] DOB: 03/08/1950 DOA: 06/13/2020 PCP: Townsend Roger, MD  HPI/Recap of past 24 hours: This is a 70 year old male quadriplegia, chronic tracheostomy, chronic PEG tube chronic Foley bedbound type 2 diabetes mellitus protein calorie malnutrition anemia of chronic disease who was admitted 29 2022 with hypotension multifocal pneumonia with acute on chronic respiratory failure and acute encephalopathy and septic shock with respiratory failure as well as new onset stage III renal failure patient was started on hemodialysis.  September 01, 2020: Patient seen and examined at bedside in ICU.  Nurse had reported that the PEG tube was clogged and interventional radiology was consulted and they unclog the tube  September 02, 2020: Patient seen and examined at bedside Patient had no event overnight  Assessment/Plan: Active Problems:   Septic shock (HCC)   Pressure injury of skin   Acute respiratory failure with hypoxia (HCC)   AKI (acute kidney injury) ([place])   Hypotension   Goals of care, counseling/discussion   Quadriplegia ([place])   Chronic kidney disease requiring chronic dialysis ([place])   Hyperglycemia   Sepsis due to Acinetobacter baumannii ([place])   Pneumonia due to Acinetobacter species ([place])   Dysautonomia ([place])  1.  Septic shock is resolved Recurrent pneumonia due to ascending Acinetobacter likely from aspiration Respiratory cultures positive Patient is on Unasyn and he was continued for 5 days based on culture and sensitivity.  Infectious disease Leukocytosis trending down now  2.  Acute kidney injury prolonged dialysis dependence on hemodialysis this is managed by nephrology  3.  Chronic respiratory failure status post trach/vent dependent PCCM following Continue vent support and trach care  4.  Status post PEG feeding.  Nurse had noted that PEG tube was clogged this morning and could not receive his medicine of interventional radiology was  consulted and they were able to unclog PEG tube.  Continue PEG feeding  Status post colostomy  6.  Acute metabolic encephalopathy.  Patient is nonverbal but is difficult to assess mental status is however awake and acknowledges when spoken to chronic anemia of chronic disease patient is on Aranesp  7.  Diabetes mellitus.  His blood sugar is stable his last hemoglobin A1c was 6.3 in June 2022  8.  Hypertension blood pressure is tolerable continue midodrine 5 mg every 8 hours  9.  Anxiety/depression continue Wellbutrin and Zoloft    Code Status: Partial code  Severity of Illness: The appropriate patient status for this patient is INPATIENT. Inpatient status is judged to be reasonable and necessary in order to provide the required intensity of service to ensure the patient's safety. The patient's presenting symptoms, physical exam findings, and initial radiographic and laboratory data in the context of their chronic comorbidities is felt to place them at high risk for further clinical deterioration. Furthermore, it is not anticipated that the patient will be medically stable for discharge from the hospital within 2 midnights of admission. The following factors support the patient status of inpatient.   " Critically ill patient tracheostomy.  Multiple medical issues  * I certify that at the point of admission it is my clinical judgment that the patient will require inpatient hospital care spanning beyond 2 midnights from the point of admission due to high intensity of service, high risk for further deterioration and high frequency of surveillance required.*   Family Communication: None at bedside  Disposition Plan: To be determined Status is: Inpatient   Dispo: The patient is from: Home  Anticipated d/c is to:               Anticipated d/c date is:               Patient currently not medically stable for discharge  Consultants: Interventional radiology Nephrology Critical  care Pulmonary Dietitian   Procedures: Tracheostomy  Antimicrobials: Unasyn  DVT prophylaxis: Eliquis but this is on hold since yesterday due to blood oozing around tracheostomy site Currently on SCD   Objective: Vitals:   09/02/20 0500 09/02/20 0600 09/02/20 0700 09/02/20 0748  BP: 124/70 (!) 142/82 (!) 148/82   Pulse: 71 73 76   Resp: 20 20 20    Temp:    98.3 F (36.8 C)  TempSrc:    Axillary  SpO2: 100% 100% 100%   Weight:      Height:        Intake/Output Summary (Last 24 hours) at 09/02/2020 0901 Last data filed at 09/01/2020 1800 Gross per 24 hour  Intake 280 ml  Output 75 ml  Net 205 ml    Filed Weights   08/31/20 1015 09/01/20 0500 09/02/20 0407  Weight: 74.8 kg 80.1 kg 79.9 kg   Body mass index is 28.43 kg/m.  Exam:  General: 70 y.o. year-old male well developed well nourished in no acute distress.  Alert and slight nodding to questions Tracheostomy tube in place with ventilation support Cardiovascular: Regular rate and rhythm with no rubs or gallops.  No thyromegaly or JVD noted.   Respiratory: Clear to auscultation with no wheezes or rales. Good inspiratory effort. Abdomen: Soft nontender nondistended with normal bowel sounds x4 quadrants..  GT site is clean Musculoskeletal: Plus lower extremity edema. 2/4 pulses in all 4 extremities. Skin: Pressure ulcer noted on the heel dressing in place, Psychiatry: Mood is appropriate for condition and setting Neurology: Unable to fully assess.  Patient is a awake, acknowledges by nodding or moving slightly head to side to side    Data Reviewed: CBC: Recent Labs  Lab 08/27/20 0742 08/28/20 0510 08/29/20 0308 08/30/20 0907 08/31/20 0419 09/01/20 0428 09/02/20 0347  WBC 28.7* 26.9* 28.8* 33.9* 29.6* 27.8* 24.5*  NEUTROABS 22.9* 21.8* 24.2* 27.8* 23.0*  --   --   HGB 9.3* 9.3* 10.3* 9.4* 8.7* 8.7* 8.7*  HCT 28.2* 28.8* 32.2* 29.3* 27.7* 26.9* 26.9*  MCV 81.3 81.4 81.9 83.0 82.9 82.3 82.0  PLT 489*  488* 504* 487* 509* 478* 463*    Basic Metabolic Panel: Recent Labs  Lab 08/27/20 0742 08/28/20 0510 08/29/20 0308 08/30/20 0907 08/31/20 0419 09/01/20 0428 09/02/20 0347  NA 132*   < > 132* 135 133* 132* 133*  K 4.1   < > 3.6 4.2 4.2 3.3* 3.3*  CL 94*   < > 96* 98 96* 93* 96*  CO2 25   < > 26 25 23 26 24   GLUCOSE 76   < > 195* 220* 181* 152* 190*  BUN 70*   < > 53* 75* 92* 52* 70*  CREATININE 1.42*   < > 1.02 1.36* 1.69* 1.11 1.48*  CALCIUM 8.9   < > 9.1 9.3 9.6 9.0 8.8*  MG 2.1  --   --   --   --   --   --    < > = values in this interval not displayed.    GFR: Estimated Creatinine Clearance: 46.1 mL/min (A) (by C-G formula based on SCr of 1.48 mg/dL (H)). Liver Function Tests: Recent Labs  Lab 08/27/20 9179 08/28/20  0510 08/29/20 0308 08/31/20 0419  AST 53* 47* 50* 33  ALT 37 34 36 27  ALKPHOS 333* 322* 344* 261*  BILITOT 1.4* 0.9 0.8 1.0  PROT 6.6 6.6 7.2 6.7  ALBUMIN 1.7* 1.7* 1.9* 1.6*    No results for input(s): LIPASE, AMYLASE in the last 168 hours. No results for input(s): AMMONIA in the last 168 hours. Coagulation Profile: No results for input(s): INR, PROTIME in the last 168 hours. Cardiac Enzymes: No results for input(s): CKTOTAL, CKMB, CKMBINDEX, TROPONINI in the last 168 hours. BNP (last 3 results) No results for input(s): PROBNP in the last 8760 hours. HbA1C: No results for input(s): HGBA1C in the last 72 hours. CBG: Recent Labs  Lab 09/01/20 1541 09/01/20 1936 09/01/20 2336 09/02/20 0329 09/02/20 0742  GLUCAP 90 150* 139* 163* 211*    Lipid Profile: No results for input(s): CHOL, HDL, LDLCALC, TRIG, CHOLHDL, LDLDIRECT in the last 72 hours. Thyroid Function Tests: No results for input(s): TSH, T4TOTAL, FREET4, T3FREE, THYROIDAB in the last 72 hours. Anemia Panel: No results for input(s): VITAMINB12, FOLATE, FERRITIN, TIBC, IRON, RETICCTPCT in the last 72 hours. Urine analysis:    Component Value Date/Time   COLORURINE YELLOW  07/25/2020 0334   APPEARANCEUR CLEAR 07/25/2020 0334   LABSPEC 1.020 07/25/2020 0334   PHURINE 6.5 07/25/2020 0334   GLUCOSEU 100 (A) 07/25/2020 0334   HGBUR LARGE (A) 07/25/2020 0334   BILIRUBINUR SMALL (A) 07/25/2020 0334   KETONESUR NEGATIVE 07/25/2020 0334   PROTEINUR >300 (A) 07/25/2020 0334   NITRITE NEGATIVE 07/25/2020 0334   LEUKOCYTESUR MODERATE (A) 07/25/2020 0334   Sepsis Labs: @LABRCNTIP (procalcitonin:4,lacticidven:4)  ) Recent Results (from the past 240 hour(s))  MRSA Next Gen by PCR, Nasal     Status: None   Collection Time: 08/23/20  4:17 PM   Specimen: Nasal Mucosa; Nasal Swab  Result Value Ref Range Status   MRSA by PCR Next Gen NOT DETECTED NOT DETECTED Final    Comment: (NOTE) The GeneXpert MRSA Assay (FDA approved for NASAL specimens only), is one component of a comprehensive MRSA colonization surveillance program. It is not intended to diagnose MRSA infection nor to guide or monitor treatment for MRSA infections. Test performance is not FDA approved in patients less than 80 years old. Performed at [facility], [place] [address] [place], [place] [address]   Culture, blood (routine x 2)     Status: None   Collection Time: 08/26/20  9:32 AM   Specimen: BLOOD RIGHT HAND  Result Value Ref Range Status   Specimen Description BLOOD RIGHT HAND  Final   Special Requests   Final    BOTTLES DRAWN AEROBIC ONLY Blood Culture results may not be optimal due to an inadequate volume of blood received in culture bottles   Culture   Final    NO GROWTH 5 DAYS Performed at [facility], [place] [address], [place], [place] [address]    Report Status 08/31/2020 FINAL  Final  Culture, blood (routine x 2)     Status: Abnormal   Collection Time: 08/26/20  9:38 AM   Specimen: BLOOD LEFT HAND  Result Value Ref Range Status   Specimen Description BLOOD LEFT HAND  Final   Special Requests   Final    BOTTLES DRAWN AEROBIC AND ANAEROBIC Blood Culture adequate volume    Culture  Setup Time   Final    GRAM POSITIVE COCCI IN CLUSTERS AEROBIC BOTTLE ONLY CRITICAL RESULT CALLED TO, READ BACK BY AND  VERIFIED WITH: PHARM D A.LAWLESS ON [id number] AT 0945 BY E.PARRISH    Culture (A)  Final    STAPHYLOCOCCUS EPIDERMIDIS THE SIGNIFICANCE OF ISOLATING THIS ORGANISM FROM A SINGLE SET OF BLOOD CULTURES WHEN MULTIPLE SETS ARE DRAWN IS UNCERTAIN. PLEASE NOTIFY THE MICROBIOLOGY DEPARTMENT WITHIN ONE WEEK IF SPECIATION AND SENSITIVITIES ARE REQUIRED. Performed at [facility], [place] [address], [place], [place] [address]    Report Status 08/29/2020 FINAL  Final  Culture, Respiratory w Gram Stain     Status: None   Collection Time: 08/27/20  9:15 AM   Specimen: Tracheal Aspirate; Respiratory  Result Value Ref Range Status   Specimen Description TRACHEAL ASPIRATE  Final   Special Requests NONE  Final   Gram Stain   Final    ABUNDANT WBC PRESENT, PREDOMINANTLY PMN FEW GRAM POSITIVE COCCI FEW GRAM NEGATIVE RODS RARE GRAM POSITIVE RODS    Culture   Final    ABUNDANT ACINETOBACTER BAUMANNII MULTI-DRUG RESISTANT ORGANISM CRITICAL RESULT CALLED TO, READ BACK BY AND VERIFIED WITH: PHARMD K MCCLURE [id number] AT 1101 AM BY CM Performed at [facility], [place] [address] [place], [place] [address]    Report Status 08/30/2020 FINAL  Final   Organism ID, Bacteria ACINETOBACTER BAUMANNII  Final      Susceptibility   Acinetobacter baumannii - MIC*    CEFTAZIDIME 16 INTERMEDIATE Intermediate     CIPROFLOXACIN >=4 RESISTANT Resistant     GENTAMICIN >=16 RESISTANT Resistant     IMIPENEM >=16 RESISTANT Resistant     PIP/TAZO >=128 RESISTANT Resistant     TRIMETH/SULFA >=320 RESISTANT Resistant     AMPICILLIN/SULBACTAM 4 SENSITIVE Sensitive     * ABUNDANT ACINETOBACTER BAUMANNII      Studies: DG Abd Portable 1V  Result Date: 09/01/2020 CLINICAL DATA:  Clogged PEG tube. Contrast could not be pushed through the tube. EXAM: PORTABLE ABDOMEN - 1 VIEW COMPARISON:   08/27/2020.  CT, 07/26/2020. FINDINGS: There is contrast within the gastrostomy tube, to the level of the balloon/diaphragm, but none distal to this and none within the stomach. Gastrostomy tube projects in the upper central abdomen adjacent to a single surgical vascular clips, which is similar to its position on the prior radiographs. Normal bowel gas pattern. Upper extent of a nephrostomy tube is stable from the prior exam. There are lung base opacities consistent with a combination of pleural effusions and atelectasis. IMPRESSION: 1. Position of the percutaneous gastrostomy tube is without convincing change from the prior radiographs. Contrast is within the tube, but does not extend to the most distal aspect of the tube or into the stomach. This is consistent with a tube being clogged as opposed to being displaced Electronically Signed   By: Lajean Manes M.D.   On: 09/01/2020 12:35    Scheduled Meds:  amiodarone  200 mg Per Tube Daily   buPROPion  75 mg Per Tube BID   chlorhexidine gluconate (MEDLINE KIT)  15 mL Mouth Rinse BID   Chlorhexidine Gluconate Cloth  6 each Topical Q0600   darbepoetin (ARANESP) injection - NON-DIALYSIS  40 mcg Subcutaneous Q Sun-1800   feeding supplement (PROSource TF)  45 mL Per Tube TID   guaiFENesin  5 mL Per Tube BID   insulin aspart  0-9 Units Subcutaneous Q4H   insulin glargine-yfgn  25 Units Subcutaneous Daily   mouth rinse  15 mL Mouth Rinse 10 times per day   midodrine  5 mg Per Tube Q8H   multivitamin  1 tablet Per Tube QHS   pantoprazole sodium  40 mg Per Tube QHS   sertraline  100 mg Per Tube Daily   sodium chloride flush  10-40 mL Intracatheter Q12H    Continuous Infusions:  sodium chloride Stopped (07/26/20 0824)   sodium chloride 500 mL (09/01/20 1408)   ampicillin-sulbactam (UNASYN) IV 3 g (09/02/20 0853)   feeding supplement (NEPRO CARB STEADY) 1,000 mL (09/02/20 0845)     LOS: 51 days     Luis Deer, MD [facility]  To  reach me or the doctor on call, go to: [email or web address] Password [facility]  09/02/2020, 9:01 AM

## 2020-09-03 DIAGNOSIS — J9621 Acute and chronic respiratory failure with hypoxia: Secondary | ICD-10-CM | POA: Diagnosis not present

## 2020-09-03 DIAGNOSIS — G8250 Quadriplegia, unspecified: Secondary | ICD-10-CM | POA: Diagnosis not present

## 2020-09-03 DIAGNOSIS — A4159 Other Gram-negative sepsis: Secondary | ICD-10-CM | POA: Diagnosis not present

## 2020-09-03 DIAGNOSIS — J158 Pneumonia due to other specified bacteria: Secondary | ICD-10-CM | POA: Diagnosis not present

## 2020-09-03 DIAGNOSIS — A419 Sepsis, unspecified organism: Secondary | ICD-10-CM | POA: Diagnosis not present

## 2020-09-03 DIAGNOSIS — G8929 Other chronic pain: Secondary | ICD-10-CM

## 2020-09-03 DIAGNOSIS — M546 Pain in thoracic spine: Secondary | ICD-10-CM

## 2020-09-03 LAB — GLUCOSE, CAPILLARY
Glucose-Capillary: 108 mg/dL — ABNORMAL HIGH (ref 70–99)
Glucose-Capillary: 133 mg/dL — ABNORMAL HIGH (ref 70–99)
Glucose-Capillary: 153 mg/dL — ABNORMAL HIGH (ref 70–99)
Glucose-Capillary: 170 mg/dL — ABNORMAL HIGH (ref 70–99)
Glucose-Capillary: 176 mg/dL — ABNORMAL HIGH (ref 70–99)
Glucose-Capillary: 180 mg/dL — ABNORMAL HIGH (ref 70–99)
Glucose-Capillary: 195 mg/dL — ABNORMAL HIGH (ref 70–99)

## 2020-09-03 LAB — PROCALCITONIN: Procalcitonin: 3.03 ng/mL

## 2020-09-03 LAB — HEMOGLOBIN AND HEMATOCRIT, BLOOD
HCT: 29.1 % — ABNORMAL LOW (ref 39.0–52.0)
Hemoglobin: 9.4 g/dL — ABNORMAL LOW (ref 13.0–17.0)

## 2020-09-03 MED ORDER — APIXABAN 5 MG PO TABS
5.0000 mg | ORAL_TABLET | Freq: Two times a day (BID) | ORAL | Status: DC
  Administered 2020-09-03 – 2020-09-04 (×3): 5 mg via ORAL
  Filled 2020-09-03 (×3): qty 1

## 2020-09-03 MED ORDER — DARBEPOETIN ALFA 40 MCG/0.4ML IJ SOSY
PREFILLED_SYRINGE | INTRAMUSCULAR | Status: AC
  Administered 2020-09-03: 40 ug via INTRAVENOUS
  Filled 2020-09-03: qty 0.4

## 2020-09-03 MED ORDER — LIDOCAINE 5 % EX PTCH
1.0000 | MEDICATED_PATCH | CUTANEOUS | Status: DC
  Administered 2020-09-03 – 2020-10-21 (×49): 1 via TRANSDERMAL
  Filled 2020-09-03 (×49): qty 1

## 2020-09-03 MED ORDER — METAXALONE 800 MG PO TABS
400.0000 mg | ORAL_TABLET | Freq: Three times a day (TID) | ORAL | Status: DC
  Administered 2020-09-03 – 2020-09-04 (×4): 400 mg
  Filled 2020-09-03: qty 0.5
  Filled 2020-09-03: qty 1
  Filled 2020-09-03 (×2): qty 0.5
  Filled 2020-09-03 (×2): qty 1
  Filled 2020-09-03 (×2): qty 0.5

## 2020-09-03 MED ORDER — "THROMBI-PAD 3""X3"" EX PADS"
1.0000 | MEDICATED_PAD | CUTANEOUS | Status: AC
  Administered 2020-09-03: 1 via TOPICAL
  Filled 2020-09-03: qty 1

## 2020-09-03 MED ORDER — SODIUM CHLORIDE 0.9 % IV SOLN: Freq: Once | INTRAVENOUS | Status: DC

## 2020-09-03 MED ORDER — HEPARIN SODIUM (PORCINE) 1000 UNIT/ML IJ SOLN
INTRAMUSCULAR | Status: AC
  Administered 2020-09-03: 3100 [IU] via INTRAVENOUS_CENTRAL
  Filled 2020-09-03: qty 7

## 2020-09-03 NOTE — Progress Notes (Signed)
No charge Progress Note  Patient is being primarily cared for by Ms. Erin Hearing, NP in the long length of stay patient team.  I personally interviewed and examined the patient this morning.  I discussed with patient's RN at bedside.  Objective: As per RN, patient has been uncomfortable but unable to indicate cause of his discomfort.  Alert, attempts to mouth words but unable to and nonverbal during my visit.  Subjective:  Vital signs:  Vitals:   09/03/20 1427 09/03/20 1431 09/03/20 1446 09/03/20 1450  BP: 130/77 132/77 (!) 76/58 94/67  Pulse: 76 76 82 81  Resp: '20 20 20 20  '$ Temp: 98.1 F (36.7 C)     TempSrc:      SpO2: 100% 100% 100% 100%  Weight: 81 kg     Height:       General exam: Elderly male, moderately built and overweight lying comfortably propped up in bed without respiratory or painful distress. Respiratory system: Somewhat harsh breath sounds bilaterally but no overt rhonchi, wheezing or crackles appreciated.  Trach with trach collar on.  Right anterior upper chest HD catheter. CVS: S1 and S2 heard, RRR.  No JVD or murmurs.  2+ pitting bilateral upper extremity edema and trace bilateral leg edema.  Telemetry personally reviewed: Sinus rhythm. CNS: Alert and tracks activity around with his eyes.  Attempts to say something but unable to.  Does not follow any instructions. Abdominal exam: PEG tube site intact and without acute findings.  Colostomy with soft yellow stools.  Labs: CBGs mildly uncontrolled and ranging from 170-222.  Procalcitonin 3.03, down from 3.61 yesterday.  On 8/21, sodium 133, potassium 3.3, BUN 70, creatinine 1.48, WBC 24.5 and hemoglobin 8.7.  Assessment and plan:  1. New ESRD: Prolonged dialysis dependent AKI progressed to new ESRD.  Now on intermittent HD MWF.  Nephrology following. 2.  Acute on chronic respiratory failure with hypoxia and hypercapnia, chronic trach and ventilatory support: PCCM continues to assist in management.  No bleeding  around tracheostomy site.  Eliquis that had been transitioned to heparin will be resumed.   3.  Recurrent Acinetobacter ventilator associated pneumonia with sepsis: Completed antibiotics.  Aggressive pulmonary toilet.  Septic shock resolved. 4.  Paroxysmal A. fib: In sinus rhythm this morning.  Resuming Eliquis 8/22. 5.  Acute metabolic encephalopathy: Resolved. 6.  Uncontrolled type II DM: Mildly uncontrolled.  Continue Lantus and SSI.  Adjust insulins as needed. 7.  Quadriplegia secondary to compressive myelopathy 8.  Other medical problems per Ms. Erin Hearing progress note.  Discussed with patient's RN and Ms. Ellis> Audiological scientist input pending.  Vernell Leep, MD, [place], [facility]. [facility]  To contact the attending provider between 7A-7P or the covering provider during after hours 7P-7A, please log into the web site [email or web address] and access using universal [place] password for that web site. If you do not have the password, please call the hospital operator.

## 2020-09-03 NOTE — TOC Progression Note (Addendum)
Transition of Care [facility]) - Progression Note    Patient Details  Name: Luis Orr MRN: [id number] Date of Birth: 04-23-1950  Transition of Care [facility]) CM/SW [place], RN Phone Number: 09/03/2020, 10:37 AM  Clinical Narrative:    Case management called and spoke to the patient's wife, Luis Orr on the phone and explained that both [facility] and [facility] in [place], [place] were unable to provide an admission bed to the patient due to his need for ventilatory support, trach and HD needs for care.  The wife was made aware that CM and MSW would explore admission options outside of [facility] at this time.  CM reached out and left a message with the following SNF facilities:  [facility] in [place], [place] - [facility] in [place], [place], [place], [place] - left message with admissions  09/03/2020 1421 - I spoke with Benjamin Stain, CM at [facility] and the facility has current bed openings for admission.  Clinicals were sent to the facility at [email or web address].  [facility] facility in [place] and [place] , [place] were unable to offer a bed to the patient due to the need for HD.  CM and MSW will continue to follow the patient for SNF placement.  09/03/2020 [place], [place] with [employer] may have an available facility in [place], [place] with bed availability.  I will follow up with the family once I have confirmed bed offers.   Expected Discharge Plan: [place] Barriers to Discharge: Continued Medical Work up, No SNF bed, Vent Bed not available  Expected Discharge Plan and Services Expected Discharge Plan: [place]   Discharge Planning Services: CM Consult Post Acute Care Choice: [place] Living arrangements for the past 2 months: Post-Acute Facility                                       Social Determinants of Health (SDOH) Interventions    Readmission Risk Interventions No flowsheet data found.

## 2020-09-03 NOTE — Progress Notes (Signed)
Pt noticed to have bright red blood in mouth and around tracheotomy site. Pt is on eliquis, and on assessment pt has bitten top lip, which caused bleeding. MD notified. Pt also noted to have bleeding from previous CVC site,  thrombi pad placed to that area. Will continue to monitor.

## 2020-09-03 NOTE — Progress Notes (Signed)
Pt's G tube is clogged, Dr.Wagner notified. IR eval ordered.

## 2020-09-03 NOTE — Progress Notes (Addendum)
[facility] PROGRESS NOTE  Luis Orr LDJ:[id number] DOB: December 28, 1950 DOA: 06/19/2020 PCP: Luis Roger, MD  Status: Remains inpatient appropriate because:Hemodynamically unstable, Persistent severe electrolyte disturbances, Unsafe d/c plan, and Inpatient level of care appropriate due to severity of illness  Dispo: The patient is from:  [facility]              Anticipated d/c is to: SNF Vent capable w/ access to in facility/stretcher based HD              Patient currently is not medically stable to d/c.   Difficult to place patient Yes    Level of care: ICU  Code Status: Partial (no CPR and no defibrillation or cardioversion) Family Communication: [name] on 8/22.  Of note the wife is also the POA DVT prophylaxis: Eliquis COVID vaccination status: Unknown    HPI:  70 y.o. 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 Patient presented secondary to altered mental status and hypotension with recurrent pneumonia requiring vasopressor support and ICU admission.he has had multiple recent admissions for sepsis secondary to various infections including UTIs, pneumonias and bacteremia in setting of MDR Klebsiella, MRSA and pseudomonas. Most recent admission September 2021 in setting of sepsis due to MDR Klebsiella with right ureteral calculus s/p double J ureteral stent placement and treated with meropenem x 10days. He was discharged back to [facility].  ICU significant events: 6/29 admitted to ICU for septic shock on levo to maintain MAP>65, continued on vent support 6/29 Blood Cx>> staph species in 1 of 4 cultures drawn>> suspect contaminant 6/30 Off pressors 7/2 ID Consult for MDR acinetobacter in trach asp, Meropenem changed to unasyn 7/2 PRBC transfusion, iron replacement 7/5 Pressors added again. Central line placed. Drainage from gastrostomy tube >> CT Abd without fluid collection around gastrostomy tube  but with diffuse body wall edema. Echo EF 50-55%, mild LVH 7/6 Renal Replacement Therapy ; Code status changed to partial Code (DNR) 7/7 Transfuse 1U PRBC 7/9 add solu cortef 7/10 off pressors, weaning stress steroids 7/11 Pressors added back on again 7/12 ID consult due to rising WBC, hypothermia, hypotension concern for worsening infection. Vancomycin added. Repeat blood cx collected. 7/15 CRRT stopped 7/17 Restarted on low dose levo. No evidence of new infection. Held off on antibiotics. Nephrology contacted family and decision was made to continue iHD this week to allow additional time for renal recovery. Patient is not the best long term iHD candidate but family wants aggressive care 7/18 plans for iHD, tolerated with 2L removed 719 slight drop in hgb to 6.9 will transfuse 1 unit PRBC 7/22 > 7/26: Tolerating HD. TOC consulted for placement. Will likely need out of state placement. 8/15 WBCs greater than 28,000, hypoglycemia, procalcitonin >18, chest x-ray consistent with pneumonia, recent blood cultures consistent with contamination.  Unasyn IV resumed after sputum culture obtained 8/16 consultation placed with ethics committee regarding evaluation for futility of care 8/16 Sputum cx + for Acinetobacter with sensitivities pending 8/17 extensive conversation held with patient's wife regarding patient's poor prognosis and reemergence of Acinetobacter VAP.  Also discussed with her that patient has been able to convey to multiple staff that he does not want to continue on the ventilator or with dialysis.  Wife reports that the patient does not understand what he is saying, she is the POA and that she wants to continue aggressive care as he is 8/18 Acinetobacter sensitive to Unasyn only.  Discussed with ID/Dr.  Juleen Orr.  Plan is to administer antibiotics for 7-day since patient has not had any increased O2 needs and remains afebrile.  If any concerns over worsening symptoms can give an additional 3  days. Developed moderate bleeding around tracheostomy site on 8/19.  Likely related to combination of Eliquis and heparin required for CRRT.  We will hold Eliquis for now and monitor degree of bleeding   Subjective: Alert today.  Denies a sensation of difficulty breathing.  Primary complaint is of back pain.  I was able to elucidate that this pain is in the upper mid thoracic region.  Dated his wife today she states he is always had back pain.  Objective: Vitals:   09/03/20 0600 09/03/20 0700  BP: 137/78 129/80  Pulse: 92 89  Resp:  20  Temp:    SpO2: 98% 98%    Intake/Output Summary (Last 24 hours) at 09/03/2020 0726 Last data filed at 09/03/2020 0700 Gross per 24 hour  Intake 1235 ml  Output 175 ml  Net 1060 ml   Filed Weights   08/31/20 1015 09/01/20 0500 09/02/20 0407  Weight: 74.8 kg 80.1 kg 79.9 kg    Exam:  Constitutional: NAD, awake and alert Respiratory: Trach: 6.0 XLT cuffed, Vent settings: PRVC mode, Fio2 40%, rate 20, VT 510, PEEP 5, lung sounds remain coarse bilaterally on anterior examination.  Respiratory effort per vent (diaphragm paralyzed).   Cardiovascular: SR, normotensive.  Focal dependent bilateral hand edema slightly improved, 1+ pedal pulses. No JVD Abdomen: Soft and nontender with palpation, normoactive bowel sounds, LLQ colostomy, PEG tube for feeds, LBM 8/21 Neurologic: CN 2-12 grossly intact on visual inspection- patient with quadriplegia so unable to independently move extremities.  No definitive sensation of extremities when checked. Psychiatric: Awake and nods head appropriately to questions asked.  Unable to accurately assess orientation since he is unable to phonate.   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 No further bleeding around tracheostomy site for the past 48 hours plus therefore will resume Eliquis and continue to monitor  Recurrent ventilator associated pneumonia 2/2 recurrent Acinetobacter/sepsis without shock WBC trend: 28.7 > 26.9 > 28.8 > 33.9 > 29.6 > 27.8 > 24.5 Procalcitonin trend: 18.73 > 14.42 > 5.54 > 6.35 > 5.39 > 4.42 > 3.61 > 3.03 CRP trend: 28.4 > 25.5 > 25.4 > 19.7 Lactic acid normal  Blood cultures with contaminant 8/16 sputum culture + Acinetobacter Baumanii and as of 8/18 sensitive to Unasyn only. 8/18 Discussed with ID/Dr Luis Orr recommends 7 days of therapy but if clinically decompensates recommends an additional 3 days more Patient remains afebrile, hemodynamically stable with WBC, PCT and CRP all trending downward.   _0  initially with ventilator associated pneumonia secondary to Acinetobacter and completed 14 days of Unasyn@   AKI with oliguria/volume overload/ New CKD requiring HD Anuric Nephrology following, initially required CRRT and has transition to HD (due to staffing issues continues on CRRT at bedside) Palliative care has been consulted and are following.  TDC placed on 7/26-family wants aggressive measures so anticipate will need permanent access   Septic shock 2/2 VAP POA Presented with sepsis physiology and profound shock which has now resolved  Chronic thoracic back pain Continue Oxy and fentanyl prn Add scheduled Skelaxin 400 TID Add lidocaine patch  History of paroxysmal atrial fibrillation  Patient was on amiodarone prior to admission  Eliquis resumed 8/22 Currently maintaining sinus rhythm TSH was 0.922 on 07/16/2018 QTc 420 ms on 8/02   Acute metabolic encephalopathy Resolved Secondary to acute illness. CT head negative for acute process.  Reportedly back at baseline.   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 Improving. 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  Previously resided at [facility] but with poor performance scores as well as poor rehabilitation potential [facility] will not accept him back if he is HD dependent (he must be able to sit up in a chair for dialysis.)   Microcytic anemia/Acute anemia Patient required 4 units of PRBC to date. Thought to be secondary to a combination of acute illness, chronic disease and iron deficiency. IV iron given.  Hemoglobin on 8/17 was 10.3   Severe protein calorie malnutrition Nutrition Problem: Increased nutrient needs Etiology: wound healing Signs/Symptoms: estimated needs Interventions: Tube feeding, Prostat, MVI Estimated body mass index is 28.43 kg/m as calculated from the following:   Height as of this encounter: _0  (1.676 m).   Weight as of this encounter: 79.9 kg.   Pressure injury Stage IV medial/posterior sacrum present on admission.  Right/posterior/lateral ankle unknown if present on admission.  DTI left ear, not present on admission. (For additional documentation see wound care notes)    Data Reviewed: Basic Metabolic Panel: Recent Labs  Lab 08/27/20 0742 08/28/20 0510 08/29/20 0308 08/30/20 2336 08/31/20 0419 09/01/20 0428 09/02/20 0347  NA 132*   < > 132* 135 133* 132* 133*  K 4.1   < > 3.6 4.2 4.2 3.3* 3.3*  CL 94*   < > 96* 98 96* 93* 96*  CO2 25   < > _1 GLUCOSE 76   < > 195* 220* 181* 152* 190*  BUN 70*   < > 53* 75* 92* 52* 70*  CREATININE 1.42*   < >  1.02 1.36* 1.69* 1.11 1.48*  CALCIUM 8.9   < > 9.1 9.3 9.6 9.0 8.8*  MG 2.1  --   --   --   --   --   --    < > = values in this interval not displayed.   Liver Function Tests: Recent Labs  Lab 08/27/20 0742 08/28/20 0510 08/29/20 0308 08/31/20 0419  AST 53* 47* 50* 33  ALT 37 34 36 27  ALKPHOS 333* 322* 344* 261*  BILITOT 1.4* 0.9 0.8 1.0  PROT 6.6 6.6 7.2 6.7  ALBUMIN 1.7* 1.7* 1.9* 1.6*   No results for input(s): LIPASE, AMYLASE in the last 168 hours. No results for input(s): AMMONIA in the last 168 hours. CBC: Recent Labs  Lab 08/27/20 0742 08/28/20 0510 08/29/20 0308 08/30/20 0907 08/31/20 0419 09/01/20 0428 09/02/20 0347  WBC 28.7* 26.9* 28.8* 33.9* 29.6* 27.8* 24.5*  NEUTROABS 22.9* 21.8* 24.2* 27.8* 23.0*  --   --   HGB 9.3* 9.3* 10.3* 9.4* 8.7* 8.7* 8.7*  HCT 28.2*  28.8* 32.2* 29.3* 27.7* 26.9* 26.9*  MCV 81.3 81.4 81.9 83.0 82.9 82.3 82.0  PLT 489* 488* 504* 487* 509* 478* 463*   Cardiac Enzymes: No results for input(s): CKTOTAL, CKMB, CKMBINDEX, TROPONINI in the last 168 hours. BNP (last 3 results) Recent Labs    07/07/2020 1922  BNP 135.0*    ProBNP (last 3 results) No results for input(s): PROBNP in the last 8760 hours.  CBG: Recent Labs  Lab 09/02/20 1129 09/02/20 1524 09/02/20 2000 09/03/20 0031 09/03/20 0420  GLUCAP 222* 190* 190* 176* 180*       Studies: DG Abd Portable 1V  Result Date: 09/01/2020 CLINICAL DATA:  Clogged PEG tube. Contrast could not be pushed through the tube. EXAM: PORTABLE ABDOMEN - 1 VIEW COMPARISON:  08/27/2020.  CT, 07/26/2020. FINDINGS: There is contrast within the gastrostomy tube, to the level of the balloon/diaphragm, but none distal to this and none within the stomach. Gastrostomy tube projects in the upper central abdomen adjacent to a single surgical vascular clips, which is similar to its position on the prior radiographs. Normal bowel gas pattern. Upper extent of a nephrostomy tube is stable from the prior  exam. There are lung base opacities consistent with a combination of pleural effusions and atelectasis. IMPRESSION: 1. Position of the percutaneous gastrostomy tube is without convincing change from the prior radiographs. Contrast is within the tube, but does not extend to the most distal aspect of the tube or into the stomach. This is consistent with a tube being clogged as opposed to being displaced Electronically Signed   By: Lajean Manes M.D.   On: 09/01/2020 12:35    Scheduled Meds:  amiodarone  200 mg Per Tube Daily   buPROPion  75 mg Per Tube BID   chlorhexidine gluconate (MEDLINE KIT)  15 mL Mouth Rinse BID   Chlorhexidine Gluconate Cloth  6 each Topical Q0600   Chlorhexidine Gluconate Cloth  6 each Topical Q0600   darbepoetin (ARANESP) injection - DIALYSIS  40 mcg Intravenous Q Mon-HD   feeding supplement (PROSource TF)  45 mL Per Tube TID   guaiFENesin  5 mL Per Tube BID   insulin aspart  0-9 Units Subcutaneous Q4H   insulin glargine-yfgn  25 Units Subcutaneous Daily   mouth rinse  15 mL Mouth Rinse 10 times per day   midodrine  5 mg Per Tube Q8H   multivitamin  1 tablet Per Tube QHS   pantoprazole sodium  40 mg Per Tube QHS   sertraline  100 mg Per Tube Daily   sodium chloride flush  10-40 mL Intracatheter Q12H   Continuous Infusions:  sodium chloride Stopped (07/26/20 0824)   sodium chloride 500 mL (09/01/20 1408)   ampicillin-sulbactam (UNASYN) IV 3 g (09/02/20 2113)   feeding supplement (NEPRO CARB STEADY) 1,000 mL (09/02/20 0845)    Active Problems:   Septic shock (HCC)   Pressure injury of skin   Acute respiratory failure with hypoxia (HCC)   AKI (acute kidney injury) ([place])   Hypotension   Goals of care, counseling/discussion   Quadriplegia ([place])   Chronic kidney disease requiring chronic dialysis ([place])   Hyperglycemia   Sepsis due to Acinetobacter baumannii ([place])   Pneumonia due to Acinetobacter species ([place])   Dysautonomia  [facility])   Consultants: PCCM Nephrology Infectious disease Palliative care medicine  Procedures: Echocardiogram Insertion of double-lumen HD catheter  Antibiotics: Cefepime x1 dose Meropenem 6/29 through 7/2 Vancomycin 6/29 through 6/30 Unasyn 7/2 through 7/15 Unasyn 8/15 >  Time spent: 35 minutes    Erin Hearing ANP  [facility] 7 am - 330 pm/M-F for direct patient care and secure chat Please refer to Amion for contact info 54  days

## 2020-09-03 NOTE — Progress Notes (Addendum)
NAME:  Luis Orr, MRN:  [id number], DOB:  [birth year], LOS: 26 ADMISSION DATE:  06/17/2020, CONSULTATION DATE:  07/05/2020 REFERRING MD:  Dr Francia Greaves, EDP CHIEF COMPLAINT:  septic shock    History of Present Illness:  70 yo male from [facility] with hx of C3-C5 compressive myelopathy with functional quadriplegia and chronic trach/vent presented to ED with altered mental status and hypotension with recurrent HCAP.  Has multiple recent admissions for sepsis secondary to various infections including UTIs, pneumonias and bacteremia in setting of MDR Klebsiella, MRSA and pseudomonas. Most recent admission September 2021 in setting of sepsis due to MDR Klebsiella with right ureteral calculus s/p double J ureteral stent placement and treated with meropenem x 10days.  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 Central line placed. Drainage from gastrostomy tube >> CT Abd without fluid collection around gastrostomy tube but with diffuse body wall edema. Echo EF 50-55%, mild LVH  7/6 Renal Replacement Therapy ; Code status changed to partial Code (DNR) 7/7 Transfuse 1U PRBC 7/9 add solu cortef 7/10 off pressors, weaning stress steroids  7/11 Pressors added back on again 7/12 ID consult due to rising WBC, hypothermia, hypotension concern for worsening infection. Vancomycin added. Repeat blood cx collected. 7/15 CRRT stopped   7/17 Restarted on low dose levo. No evidence of new infection. Held off on antibiotics. Nephrology contacted family and decision was made to continue iHD this week to allow additional time for renal recovery. Patient is not a long term iHD candidate  7/18 plans for iHD, tolerated with 2L removed  7/19 slight drop in hgb to 6.9 will transfuse 1 unit PRBC  7/20 To [facility], PCCM following for trach / vent needs 7/22>> tolerating HD, will need out of state placement 8/4 last PCCM visit. 8/4 -8/8: No issues. CM workin gon out of state placement  8/15 no acute changes. Continues on PRVC, was apneic with PSV attempt. Did wean to 40% FiO2   Interim History / Subjective:  Remains on full support - 40% / PEEP 5   Objective   Blood pressure 129/80, pulse 89, temperature 98.1 F (36.7 C), temperature source Oral, resp. rate 20, height '5\' 6"'$  (1.676 m), weight 79.9 kg, SpO2 98 %.    Vent Mode: PRVC FiO2 (%):  [40 %] 40 % Set Rate:  [20 bmp] 20 bmp Vt Set:  [510 mL] 510 mL PEEP:  [5 cmH20] 5 cmH20 Plateau Pressure:  [29 cmH20-33 cmH20] 31 cmH20   Intake/Output Summary (Last 24 hours) at 09/03/2020 0809 Last data filed at 09/03/2020 0700 Gross per 24 hour  Intake 1190 ml  Output 175 ml  Net 1015 ml   Filed Weights   08/31/20 1015 09/01/20 0500 09/02/20 0407  Weight: 74.8 kg 80.1 kg 79.9 kg    Physical Exam: General: adult male, appears uncomfortable but in no acute distress HEENT: MM pink/moist, facial grimace, anicteric, makes eye contact, denies pain Neuro: Awake, alert, grimaces  when informed of the date CV: s1s2 RRR, SR 80's on monitor, no m/r/g PULM: non-labored at rest on vent, lungs coarse with rhonchi bilaterally, trach midline with creamy secretions at site GI: soft, bsx4 active  Extremities: warm/dry, generalized dependent edema  Skin: no rashes or lesions  Resolved Hospital Problem list   Hyperkalemia Tachycardia Hypotension Thrombocytosis  AKI Septic shock 2nd to HCAP and  UTI all present on admission -Acinetobacter in sputum culture from 07/12/20. Completed 14 days of Unasyn for -Of pressors on 4/14. Restarted  levo 7/17. -acute on chronic respiratory failure   Assessment & Plan:   C3-C5 compressive Myelopathy w/ resultant Chronic Respiratory failure, vent and trach dependence  Acinetobacter Baumannii PNA Not weaning -PRVC 8cc/kg as support mode, not able to wean  -trach care per protocol  -VAP prevention measures  -unasyn per [facility] for infiltrates -follow intermittent CXR / or if new symptoms  -DNR in event of cardiac arrest    PCCM will see biweekly while in ICU     Noe Gens, MSN, APRN, NP-C, AGACNP-BC [facility] 09/03/2020, 8:11 AM   Please see [email or web address] for pager details.   From 7A-7P if no response, please call [phone] After hours, please call ELink [phone]

## 2020-09-03 NOTE — NC FL2 (Signed)
[place] LEVEL OF CARE SCREENING TOOL     IDENTIFICATION  Patient Name: Luis Orr Birthdate: Sep 06, 1950 Sex: male Admission Date (Current Location): 07/12/2020  [place] and [place] Number:  Kathleen Argue [id number] [place] and Address:  [facility] [place], [place] [address], [place], [place] [address]      Provider Number: [id number]  Attending Physician Name and Address:  Modena Jansky, MD  Relative Name and Phone Number:  June Puskas, spouse - [phone]    Current Level of Care: Hospital Recommended Level of Care: Vent SNF (Vent SNF / HD needs) Prior Approval Number:    Date Approved/Denied:   PASRR Number: [id number]  Discharge Plan: SNF    Current Diagnoses: Patient Active Problem List   Diagnosis Date Noted   Chronic midline thoracic back pain    Dysautonomia ([place])    Sepsis due to Acinetobacter baumannii ([place])    Pneumonia due to Acinetobacter species ([place])    Hyperglycemia    Goals of care, counseling/discussion    Quadriplegia ([place])    Chronic kidney disease requiring chronic dialysis ([place])    Hypotension    AKI (acute kidney injury) ([place])    Acute respiratory failure with hypoxia ([place])    Pressure injury of skin 07/13/2020   Acute on chronic respiratory failure with hypercapnia ([place])    HCAP (healthcare-associated pneumonia)    Sepsis ([place]) 12/20/2019   Septic shock ([place]) 12/19/2019   Severe sepsis ([place]) 12/19/2019   History of ESBL Klebsiella pneumoniae infection 10/07/2019   History of MRSA infection 10/07/2019   Adynamic ileus ([place]) 10/07/2019   Sacral decubitus ulcer 10/07/2019   AMS (altered mental status) 07/17/2019   SIRS (systemic inflammatory response syndrome) ([place])    Ventilator dependence ([place])    Bacteremia due to Staphylococcus 07/07/2019   Functional quadriplegia ([place]) 07/06/2019   Chronic respiratory failure ([place]) 07/06/2019   Sepsis secondary to UTI ([place]) 07/06/2019   Fecal impaction ([place])  07/06/2019   HTN (hypertension) 07/06/2019   DM2 (diabetes mellitus, type 2) ([place]) 07/06/2019    Orientation RESPIRATION BLADDER Height & Weight     Self, Place, Situation  Tracheostomy, Vent  (CKD requiring HD on MWF) Weight: 79.9 kg Height:  5' 6"  (167.6 cm)  BEHAVIORAL SYMPTOMS/MOOD NEUROLOGICAL BOWEL NUTRITION STATUS      Colostomy Feeding tube  AMBULATORY STATUS COMMUNICATION OF NEEDS Skin   Total Care Does not communicate (Unable to communicate due to trach / vent) PU Stage and Appropriate Care, Other (Comment) (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 - BID wet to dry dressing changes)       PU Stage 4 Dressing: BID               Personal Care Assistance Level of Assistance  Bathing, Feeding, Dressing, Total care Bathing Assistance: Maximum assistance Feeding assistance: Maximum assistance Dressing Assistance: Maximum assistance Total Care Assistance: Maximum assistance   Functional Limitations Info  Sight, Hearing, Speech Sight Info: Adequate Hearing Info: Adequate Speech Info: Impaired (trach - ventilator)    SPECIAL CARE FACTORS FREQUENCY                       Contractures Contractures Info: Not present    Additional Factors Info  Code Status, Allergies, Psychotropic, Insulin Sliding Scale, Isolation Precautions, Suctioning Needs Code Status Info: Partial code - Allergies Info: Adhesive (Tape) Psychotropic Info: Wellbutrin, Zoloft Insulin Sliding Scale Info: Sensitive Sliding Scale every 4 hours Isolation Precautions Info: Contact  precautions - OMDRO, ESBL, MRSA Suctioning Needs: Needs respiratory / nursing support for suctioning of tracheostomy - [place] #6, XLT cuffed to ventilator   Current Medications (09/03/2020):  This is the current hospital active medication list Current Facility-Administered Medications  Medication Dose Route Frequency Provider Last Rate Last Admin   0.9 %  sodium chloride infusion  250 mL Intravenous Continuous Olalere, Adewale  A, MD   Stopped at 07/26/20 0824   0.9 %  sodium chloride infusion   Intravenous PRN Freda Jackson B, MD 10 mL/hr at 09/01/20 1408 500 mL at 09/01/20 1408   0.9 %  sodium chloride infusion   Intravenous Once Olalere, Adewale A, MD       amiodarone (PACERONE) tablet 200 mg  200 mg Per Tube Daily Corey Harold, NP   200 mg at 09/03/20 0947   apixaban (ELIQUIS) tablet 5 mg  5 mg Oral BID Samella Parr, NP   5 mg at 09/03/20 0948   buPROPion [facility]) tablet 75 mg  75 mg Per Tube BID Chesley Mires, MD   75 mg at 09/03/20 0258   chlorhexidine gluconate (MEDLINE KIT) (PERIDEX) 0.12 % solution 15 mL  15 mL Mouth Rinse BID Brand Males, MD   15 mL at 09/03/20 5277   Chlorhexidine Gluconate Cloth 2 % PADS 6 each  6 each Topical Q0600 Claudia Desanctis, MD   6 each at 09/02/20 0406   Chlorhexidine Gluconate Cloth 2 % PADS 6 each  6 each Topical Q0600 Rosita Fire, MD   6 each at 09/02/20 1500   Darbepoetin Alfa (ARANESP) injection 40 mcg  40 mcg Intravenous Q Mon-HD Rosita Fire, MD       feeding supplement (NEPRO CARB STEADY) liquid 1,000 mL  1,000 mL Per Tube Continuous Lavina Hamman, MD 45 mL/hr at 09/03/20 0827 1,000 mL at 09/03/20 0827   feeding supplement (PROSource TF) liquid 45 mL  45 mL Per Tube TID Antonieta Pert, MD   45 mL at 09/03/20 0949   fentaNYL (SUBLIMAZE) injection 50 mcg  50 mcg Intravenous Q4H PRN Gerald Leitz D, NP   50 mcg at 09/03/20 0831   guaiFENesin (ROBITUSSIN) 100 MG/5ML solution 100 mg  5 mL Per Tube BID Noe Gens L, NP   100 mg at 09/03/20 0947   heparin injection 3,100 Units  40 Units/kg Dialysis PRN Elmarie [place], MD       insulin aspart (novoLOG) injection 0-9 Units  0-9 Units Subcutaneous Q4H Lavina Hamman, MD   2 Units at 09/03/20 0827   insulin glargine-yfgn (SEMGLEE) injection 25 Units  25 Units Subcutaneous Daily Samella Parr, NP   25 Units at 09/03/20 0947   ipratropium-albuterol (DUONEB) 0.5-2.5 (3) MG/3ML nebulizer solution 3 mL   3 mL Nebulization Q6H PRN Mariel Aloe, MD       lidocaine (LIDODERM) 5 % 1 patch  1 patch Transdermal Q24H Samella Parr, NP   1 patch at 09/03/20 0945   lidocaine (PF) (XYLOCAINE) 1 % injection 5 mL  5 mL Intradermal PRN Justin Mend, MD       lidocaine (PF) (XYLOCAINE) 1 % injection    PRN Mir, Paula Libra, MD   10 mL at 08/28/20 1241   lidocaine-prilocaine (EMLA) cream 1 application  1 application Topical PRN Justin Mend, MD       MEDLINE mouth rinse  15 mL Mouth Rinse 10 times per day Brand Males, MD   15 mL  at 09/03/20 0947   metaxalone (SKELAXIN) tablet 400 mg  400 mg Per Tube TID Samella Parr, NP       midodrine (PROAMATINE) tablet 5 mg  5 mg Per Tube Q8H Lavina Hamman, MD   5 mg at 09/03/20 2217   multivitamin (RENA-VIT) tablet 1 tablet  1 tablet Per Tube QHS Mannam, Praveen, MD   1 tablet at 09/02/20 2114   oxyCODONE (ROXICODONE) 5 MG/5ML solution 5 mg  5 mg Per Tube Q6H PRN Gerald Leitz D, NP   5 mg at 09/01/20 1633   pantoprazole sodium (PROTONIX) 40 mg/20 mL oral suspension 40 mg  40 mg Per Tube QHS Sloan Leiter B, RPH   40 mg at 09/02/20 2117   pentafluoroprop-tetrafluoroeth (GEBAUERS) aerosol 1 application  1 application Topical PRN Justin Mend, MD       polyethylene glycol (MIRALAX / GLYCOLAX) packet 17 g  17 g Per Tube Daily PRN Henri Medal, RPH       sertraline (ZOLOFT) tablet 100 mg  100 mg Per Tube Daily Freda Jackson B, MD   100 mg at 09/03/20 0948   sodium chloride flush (NS) 0.9 % injection 10-40 mL  10-40 mL Intracatheter Q12H Brand Males, MD   10 mL at 09/03/20 0948   sodium chloride flush (NS) 0.9 % injection 10-40 mL  10-40 mL Intracatheter PRN Brand Males, MD         Discharge Medications: Please see discharge summary for a list of discharge medications.  Relevant Imaging Results:  Relevant Lab Results:   Additional Information SS# [id number]  Curlene Labrum, RN

## 2020-09-03 NOTE — Progress Notes (Signed)
IR was consulted for clogged/ malfunctioning G tube on 8/20.   20 fr balloon retention G tube was de-clogged at bedside on 8/20, with possible exchange this week if the G tube continues to malfunction.   RN states no issue with the G tube.  IR will sign off, please call IR for questions and concerns regarding the G tube.    Armando Gang Lokelani Lutes PA-C 09/03/2020 9:11 AM

## 2020-09-03 NOTE — Progress Notes (Signed)
RT NOTE: no SBT on patient this AM due to patient being a chronic vent/trach.  Tolerating settings well.  Will continue to monitor.

## 2020-09-03 NOTE — Progress Notes (Signed)
[facility] NEPHROLOGY PROGRESS NOTE  Assessment/ Plan:  # Prolonged dialysis dependent AKI, now ESRD:  renal replacement therapy started 7/6; Initially on CRRT and now on intermittent hemodialysis.   On MWF scheudle, including today  # Septic shock/ventilator associated pneumonia due to recurrent Acinetobacter infection: Hemodynamically stable off pressors.  Off ABX, per TRH/CCM  #  Chronic respiratory failure: Status post tracheostomy with ventilator management per CCM.  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   # Acute metabolic encephalopathy: Continue management as above.,  Supportive care.  #Mild hypokalemia: He is on tube feed.  Added potassium bath.  Subjective:  Seen and examined in ICU.   No urine output.   Remains on vent.   No new event.  Objective Vital signs in last 24 hours: Vitals:   09/03/20 0852 09/03/20 0900 09/03/20 0910 09/03/20 1000  BP: 134/82 (!) 142/86  (!) 145/87  Pulse: 87 86  85  Resp: 20 20  20   Temp:   98.6 F (37 C)   TempSrc:   Oral   SpO2: 98% 98%  99%  Weight:      Height:       Weight change:   Intake/Output Summary (Last 24 hours) at 09/03/2020 1110 Last data filed at 09/03/2020 1000 Gross per 24 hour  Intake 1142.89 ml  Output 175 ml  Net 967.89 ml        Labs: Basic Metabolic Panel: Recent Labs  Lab 08/31/20 0419 09/01/20 0428 09/02/20 0347  NA 133* 132* 133*  K 4.2 3.3* 3.3*  CL 96* 93* 96*  CO2 23 26 24   GLUCOSE 181* 152* 190*  BUN 92* 52* 70*  CREATININE 1.69* 1.11 1.48*  CALCIUM 9.6 9.0 8.8*    Liver Function Tests: Recent Labs  Lab 08/28/20 0510 08/29/20 0308 08/31/20 0419  AST 47* 50* 33  ALT 34 36 27  ALKPHOS 322* 344*  261*  BILITOT 0.9 0.8 1.0  PROT 6.6 7.2 6.7  ALBUMIN 1.7* 1.9* 1.6*    No results for input(s): LIPASE, AMYLASE in the last 168 hours. No results for input(s): AMMONIA in the last 168 hours. CBC: Recent Labs  Lab 08/29/20 0308 08/30/20 0907 08/31/20 0419 09/01/20 0428 09/02/20 0347  WBC 28.8* 33.9* 29.6* 27.8* 24.5*  NEUTROABS 24.2* 27.8* 23.0*  --   --   HGB 10.3* 9.4* 8.7* 8.7* 8.7*  HCT 32.2* 29.3* 27.7* 26.9* 26.9*  MCV 81.9 83.0 82.9 82.3 82.0  PLT 504* 487* 509* 478* 463*    Cardiac Enzymes: No results for input(s): CKTOTAL, CKMB, CKMBINDEX, TROPONINI in the last 168 hours. CBG: Recent Labs  Lab 09/02/20 1524 09/02/20 2000 09/03/20 0031 09/03/20 0420 09/03/20 0826  GLUCAP 190* 190* 176* 180* 195*     Iron Studies: No results for input(s): IRON, TIBC, TRANSFERRIN, FERRITIN in the last 72 hours. Studies/Results: DG Abd Portable 1V  Result Date: 09/01/2020 CLINICAL DATA:  Clogged PEG tube. Contrast could not be pushed through the tube. EXAM: PORTABLE ABDOMEN - 1 VIEW COMPARISON:  08/27/2020.  CT, 07/26/2020. FINDINGS: There is contrast within the gastrostomy tube, to the level of the balloon/diaphragm, but none distal to this and none within the stomach. Gastrostomy tube projects in the upper central abdomen adjacent to a single surgical  vascular clips, which is similar to its position on the prior radiographs. Normal bowel gas pattern. Upper extent of a nephrostomy tube is stable from the prior exam. There are lung base opacities consistent with a combination of pleural effusions and atelectasis. IMPRESSION: 1. Position of the percutaneous gastrostomy tube is without convincing change from the prior radiographs. Contrast is within the tube, but does not extend to the most distal aspect of the tube or into the stomach. This is consistent with a tube being clogged as opposed to being displaced Electronically Signed   By: Lajean Manes M.D.   On: 09/01/2020 12:35     Medications: Infusions:  sodium chloride Stopped (07/26/20 0824)   sodium chloride 500 mL (09/01/20 1408)   sodium chloride     feeding supplement (NEPRO CARB STEADY) 1,000 mL (09/03/20 0827)    Scheduled Medications:  amiodarone  200 mg Per Tube Daily   apixaban  5 mg Oral BID   buPROPion  75 mg Per Tube BID   chlorhexidine gluconate (MEDLINE KIT)  15 mL Mouth Rinse BID   Chlorhexidine Gluconate Cloth  6 each Topical Q0600   Chlorhexidine Gluconate Cloth  6 each Topical Q0600   darbepoetin (ARANESP) injection - DIALYSIS  40 mcg Intravenous Q Mon-HD   feeding supplement (PROSource TF)  45 mL Per Tube TID   guaiFENesin  5 mL Per Tube BID   insulin aspart  0-9 Units Subcutaneous Q4H   insulin glargine-yfgn  25 Units Subcutaneous Daily   lidocaine  1 patch Transdermal Q24H   mouth rinse  15 mL Mouth Rinse 10 times per day   metaxalone  400 mg Per Tube TID   midodrine  5 mg Per Tube Q8H   multivitamin  1 tablet Per Tube QHS   pantoprazole sodium  40 mg Per Tube QHS   sertraline  100 mg Per Tube Daily   sodium chloride flush  10-40 mL Intracatheter Q12H    have reviewed scheduled and prn medications.  Physical Exam: General:NAD, comfortable on tracheostomy/on vent.  Flutters eyes on voice interaction Heart:RRR, s1s2 nl Lungs: Coarse breath sounds bilaterally, no wheezing Abdomen:soft, Non-tender, PEG tube and ostomy bag Extremities:Trace edema Dialysis Access: Right IJ TDC  [place] Alexa B Clarie Camey 09/03/2020,11:10 AM  LOS: 54 days

## 2020-09-04 DIAGNOSIS — A4159 Other Gram-negative sepsis: Secondary | ICD-10-CM | POA: Diagnosis not present

## 2020-09-04 DIAGNOSIS — G8250 Quadriplegia, unspecified: Secondary | ICD-10-CM | POA: Diagnosis not present

## 2020-09-04 DIAGNOSIS — J158 Pneumonia due to other specified bacteria: Secondary | ICD-10-CM | POA: Diagnosis not present

## 2020-09-04 DIAGNOSIS — J9610 Chronic respiratory failure, unspecified whether with hypoxia or hypercapnia: Secondary | ICD-10-CM

## 2020-09-04 DIAGNOSIS — A419 Sepsis, unspecified organism: Secondary | ICD-10-CM | POA: Diagnosis not present

## 2020-09-04 LAB — COMPREHENSIVE METABOLIC PANEL
ALT: 21 U/L (ref 0–44)
AST: 30 U/L (ref 15–41)
Albumin: 1.7 g/dL — ABNORMAL LOW (ref 3.5–5.0)
Alkaline Phosphatase: 223 U/L — ABNORMAL HIGH (ref 38–126)
Anion gap: 11 (ref 5–15)
BUN: 47 mg/dL — ABNORMAL HIGH (ref 8–23)
CO2: 26 mmol/L (ref 22–32)
Calcium: 8.7 mg/dL — ABNORMAL LOW (ref 8.9–10.3)
Chloride: 97 mmol/L — ABNORMAL LOW (ref 98–111)
Creatinine, Ser: 1.15 mg/dL (ref 0.61–1.24)
GFR, Estimated: >60 mL/min (ref >60)
Glucose, Bld: 104 mg/dL — ABNORMAL HIGH (ref 70–99)
Potassium: 4.4 mmol/L (ref 3.5–5.1)
Sodium: 134 mmol/L — ABNORMAL LOW (ref 135–145)
Total Bilirubin: 0.5 mg/dL (ref 0.3–1.2)
Total Protein: 7.2 g/dL (ref 6.5–8.1)

## 2020-09-04 LAB — GLUCOSE, CAPILLARY
Glucose-Capillary: 101 mg/dL — ABNORMAL HIGH (ref 70–99)
Glucose-Capillary: 103 mg/dL — ABNORMAL HIGH (ref 70–99)
Glucose-Capillary: 122 mg/dL — ABNORMAL HIGH (ref 70–99)
Glucose-Capillary: 74 mg/dL (ref 70–99)
Glucose-Capillary: 87 mg/dL (ref 70–99)
Glucose-Capillary: 99 mg/dL (ref 70–99)

## 2020-09-04 LAB — CBC WITH DIFFERENTIAL/PLATELET
Abs Immature Granulocytes: 1.32 10*3/uL — ABNORMAL HIGH (ref 0.00–0.07)
Basophils Absolute: 0.1 10*3/uL (ref 0.0–0.1)
Basophils Relative: 0 %
Eosinophils Absolute: 0.2 10*3/uL (ref 0.0–0.5)
Eosinophils Relative: 1 %
HCT: 28.2 % — ABNORMAL LOW (ref 39.0–52.0)
Hemoglobin: 8.8 g/dL — ABNORMAL LOW (ref 13.0–17.0)
Immature Granulocytes: 6 %
Lymphocytes Relative: 10 %
Lymphs Abs: 2.3 10*3/uL (ref 0.7–4.0)
MCH: 25.7 pg — ABNORMAL LOW (ref 26.0–34.0)
MCHC: 31.2 g/dL (ref 30.0–36.0)
MCV: 82.5 fL (ref 80.0–100.0)
Monocytes Absolute: 1.7 10*3/uL — ABNORMAL HIGH (ref 0.1–1.0)
Monocytes Relative: 7 %
Neutro Abs: 17.9 10*3/uL — ABNORMAL HIGH (ref 1.7–7.7)
Neutrophils Relative %: 76 %
Platelets: 479 10*3/uL — ABNORMAL HIGH (ref 150–400)
RBC: 3.42 MIL/uL — ABNORMAL LOW (ref 4.22–5.81)
RDW: 19.9 % — ABNORMAL HIGH (ref 11.5–15.5)
WBC: 23.5 10*3/uL — ABNORMAL HIGH (ref 4.0–10.5)
nRBC: 0.8 % — ABNORMAL HIGH (ref 0.0–0.2)

## 2020-09-04 LAB — PROCALCITONIN: Procalcitonin: 3.55 ng/mL

## 2020-09-04 MED ORDER — PANCRELIPASE (LIP-PROT-AMYL) 10440-39150 UNITS PO TABS
20880.0000 [IU] | ORAL_TABLET | Freq: Once | ORAL | Status: AC
  Administered 2020-09-04: 20880 [IU]
  Filled 2020-09-04: qty 2

## 2020-09-04 MED ORDER — SCOPOLAMINE 1 MG/3DAYS TD PT72
1.0000 | MEDICATED_PATCH | TRANSDERMAL | Status: DC
  Administered 2020-09-04 – 2020-09-19 (×6): 1.5 mg via TRANSDERMAL
  Filled 2020-09-04 (×6): qty 1

## 2020-09-04 MED ORDER — INSULIN GLARGINE-YFGN 100 UNIT/ML ~~LOC~~ SOLN
10.0000 [IU] | Freq: Once | SUBCUTANEOUS | Status: AC
  Administered 2020-09-04: 10 [IU] via SUBCUTANEOUS
  Filled 2020-09-04: qty 0.1

## 2020-09-04 MED ORDER — SODIUM BICARBONATE 650 MG PO TABS: 650.0000 mg | ORAL_TABLET | Freq: Once | ORAL | Status: DC

## 2020-09-04 MED ORDER — PANCRELIPASE (LIP-PROT-AMYL) 10440-39150 UNITS PO TABS: 20880.0000 [IU] | ORAL_TABLET | Freq: Once | ORAL | Status: DC

## 2020-09-04 MED ORDER — SODIUM BICARBONATE 650 MG PO TABS
650.0000 mg | ORAL_TABLET | Freq: Once | ORAL | Status: AC
  Administered 2020-09-04: 650 mg
  Filled 2020-09-04: qty 1

## 2020-09-04 NOTE — Progress Notes (Addendum)
IR was consulted for clogged/ malfunctioning G tube on 8/20.   20 fr balloon retention G tube was de-clogged at bedside on 8/20, with possible exchange this week if the G tube continues to malfunction.   Contacted by RN this morning that the G tube was not working.  IR was planning on exchange today; however, informed by RN again that she was able to de-clog the G tube by administering sodium bicarb and pancreatic enzyme.     Will deleted the IR rad eval order.  Please call IR for questions and concerns regarding the G tube.    Armando Gang Ajah Vanhoose PA-C 09/04/2020 11:47 AM

## 2020-09-04 NOTE — Progress Notes (Signed)
Video connection link was texted to participant via mobile number provided. Call was connected successfully.

## 2020-09-04 NOTE — Progress Notes (Signed)
No charge Progress Note  I visited patient this morning.  Nursing at bedside performing suctioning.  Overnight events noted.  Reportedly had bleeding from a small cut on the left lateral aspect of the upper lip and around tracheostomy site.  Also bleeding from previous left central venous catheter site-  Hemostasis achieved by thrombi pad placed to that site.  Also PEG tube clogged and IR consulted  Patient appears in no distress.  Appears stable.  No further bleeding noted.  Visualized small superficial cut on the left lateral aspect of upper lip at approximation area with the lower lip.  No longer bleeding.  Left IJ/CVC site dressing with minimal dry blood but no active bleeding.  I am informed that IR will be changing his PEG tube today.  Discussed with Ms. Erin Hearing, NP and advised that since both patient and family now wish continued aggressive care, ethics consult will not be necessary.  SNF disposition however may be an issue if family declines out-of-state facilities  Vernell Leep, MD, [place], [facility]. [facility]  To contact the attending provider between 7A-7P or the covering provider during after hours 7P-7A, please log into the web site [email or web address] and access using universal [place] password for that web site. If you do not have the password, please call the hospital operator.

## 2020-09-04 NOTE — Procedures (Signed)
Tracheostomy Change Note  Patient Details:   Name: Luis Orr DOB: 1950/04/11 MRN: [id number]    Airway Documentation:     Evaluation  O2 sats: stable throughout Complications: No apparent complications Patient did tolerate procedure well. Bilateral Breath Sounds: Clear, Rhonchi routine trach change per MD order ETCO2 good color change, BBS equal, SPO2 100%    Gonzella Lex 09/04/2020, 4:06 PM

## 2020-09-04 NOTE — TOC Progression Note (Addendum)
Transition of Care [facility]) - Progression Note    Patient Details  Name: Luis Orr MRN: [id number] Date of Birth: 06/03/1950  Transition of Care [facility]) CM/SW [place], RN Phone Number: 09/04/2020, 12:15 PM  Clinical Narrative:    Case management spoke with two skilled nursing facilities on 09/03/2020 that are located in [place].  [place] are currently reviewing the patient's clinicals for possible placement for the patient.  CM called and left a message with the patient's wife, Luis Orr to discuss possible bed offers and locations of the facilities.  I  left a message on her voicemail to return my call and discuss transitions of care plans.  At this time, no LTC ventilator / HD facilities are located in the state of [place].  The closest facilities at this time that have open bed availability are located in [place], [place] and [place], [place].  I will discuss this with the patient's wife so that she understands that the patient has limited facilities that are able to provide the complex nursing care that the patient requires and that transfer to an out of state facility is inevitable.  Madilyn Fireman, MSW is aware and will assist as needed for admission and transfer to an appropriate SNF facility.  CM and MSW will continue to follow the patient for SNF placement.  09/04/2020 - CM spoke with Lindi Adie, [place] at [facility] in [place], [place] and they are unable to offer a bed to the patient since they are unable to provide HD for the patient.  I left a message with the patient's wife on the phone.  The closest facilities to offer HD/ventilator capability would be [place] and [place].  09/04/2020  1322 - CM called and left a message with Burgess Estelle, CM at [facility] to see if patient could transfer to [place] first and then transfer to the two other accepting facilities in [place] - including [facility] and [facility] in [place].  Burgess Estelle, CM to check with DON at [place].   Expected Discharge Plan: [place] Barriers to Discharge: Continued Medical Work up, No SNF bed, Vent Bed not available  Expected Discharge Plan and Services Expected Discharge Plan: [place]   Discharge Planning Services: CM Consult Post Acute Care Choice: [place] Living arrangements for the past 2 months: Post-Acute Facility                                       Social Determinants of Health (SDOH) Interventions    Readmission Risk Interventions No flowsheet data found.

## 2020-09-04 NOTE — TOC Progression Note (Signed)
Transition of Care [facility]) - Progression Note    Patient Details  Name: Luis Orr MRN: [id number] Date of Birth: 10-Jun-1950  Transition of Care [facility]) CM/SW [place], RN Phone Number: 09/04/2020, 2:57 PM  Clinical Narrative:    Case management spoke with the patient's sister, Luis Orr on the phone to offer choice regarding Vent/SNF/HD facilities in [place] including [facility] in [place] and [facility] in [place], [place].  The patient's sister is aware that these are the closest facilities available for admission to provide the complex nursing care needed for the patient.  The sister states that she will update the patient's wife tonight since the wife is most likely at work.  The sister asked that I reach out to [facility] first and [facility], second.   Expected Discharge Plan: [place] Barriers to Discharge: Continued Medical Work up, No SNF bed, Vent Bed not available  Expected Discharge Plan and Services Expected Discharge Plan: [place]   Discharge Planning Services: CM Consult Post Acute Care Choice: [place] Living arrangements for the past 2 months: Post-Acute Facility                                       Social Determinants of Health (SDOH) Interventions    Readmission Risk Interventions No flowsheet data found.

## 2020-09-04 NOTE — Progress Notes (Signed)
[facility] NEPHROLOGY PROGRESS NOTE  Assessment/ Plan:  # Prolonged dialysis dependent AKI, now ESRD:  renal replacement therapy started 7/6; Initially on CRRT and now on intermittent hemodialysis.   On MWF scheudle, 3.5h, 2K, 2-3L UF, HD cath, no heparin  # Septic shock/ventilator associated pneumonia due to recurrent Acinetobacter infection: Hemodynamically stable off pressors.  Off ABX, per TRH/CCM  #  Chronic respiratory failure: Status post tracheostomy with ventilator management per CCM.  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  # CKD-BMD: Calcium and phosphorus level under decent control with hemodialysis, off binders. CTM  # Anemia: contiue ESA, monitor Hb.   # Acute metabolic encephalopathy: Continue management as above.,  Supportive care.  #Mild hypokalemia: Improved, CTM  Subjective:  HD yesterday: 2L UF Seen and examined in ICU.   No UOP No interval events  Objective Vital signs in last 24 hours: Vitals:   09/04/20 0600 09/04/20 0730 09/04/20 0755 09/04/20 0830  BP: 91/63 104/71  103/71  Pulse:  85    Resp: _0 Temp:   98.1 F (36.7 C)   TempSrc:   Oral   SpO2:  100%  100%  Weight:      Height:       Weight change:   Intake/Output Summary (Last 24 hours) at 09/04/2020 1043 Last data filed at 09/03/2020 1727 Gross per 24 hour  Intake 315 ml  Output 2050 ml  Net -1735 ml        Labs: Basic Metabolic Panel: Recent Labs  Lab 09/01/20 0428 09/02/20 0347 09/04/20 0437  NA 132* 133* 134*  K 3.3* 3.3* 4.4  CL 93* 96* 97*  CO2 _1 GLUCOSE 152* 190* 104*  BUN 52* 70* 47*  CREATININE 1.11 1.48* 1.15  CALCIUM 9.0 8.8* 8.7*    Liver Function Tests: Recent Labs  Lab 08/29/20 0308 08/31/20 0419 09/04/20 0437  AST 50* 33 30  ALT 36 27 21  ALKPHOS 344* 261* 223*  BILITOT 0.8 1.0 0.5   PROT 7.2 6.7 7.2  ALBUMIN 1.9* 1.6* 1.7*    No results for input(s): LIPASE, AMYLASE in the last 168 hours. No results for input(s): AMMONIA in the last 168 hours. CBC: Recent Labs  Lab 08/30/20 0907 08/31/20 0419 09/01/20 0428 09/02/20 0347 09/03/20 1855 09/04/20 0437  WBC 33.9* 29.6* 27.8* 24.5*  --  23.5*  NEUTROABS 27.8* 23.0*  --   --   --  17.9*  HGB 9.4* 8.7* 8.7* 8.7* 9.4* 8.8*  HCT 29.3* 27.7* 26.9* 26.9* 29.1* 28.2*  MCV 83.0 82.9 82.3 82.0  --  82.5  PLT 487* 509* 478* 463*  --  479*    Cardiac Enzymes: No results for input(s): CKTOTAL, CKMB, CKMBINDEX, TROPONINI in the last 168 hours. CBG: Recent Labs  Lab 09/03/20 1548 09/03/20 1939 09/03/20 2346 09/04/20 0446 09/04/20 0753  GLUCAP 133* 153* 108* 101* 87     Iron Studies: No results for input(s): IRON, TIBC, TRANSFERRIN, FERRITIN in the last 72 hours. Studies/Results: No results found.  Medications: Infusions:  sodium chloride Stopped (07/26/20 0824)   sodium chloride 500 mL (09/01/20 1408)   sodium chloride     feeding supplement (NEPRO CARB STEADY) 1,000 mL (09/03/20 0827)    Scheduled Medications:  amiodarone  200 mg Per Tube Daily   apixaban  5 mg Oral BID   buPROPion  75 mg Per Tube BID   chlorhexidine gluconate (MEDLINE KIT)  15 mL Mouth Rinse BID   Chlorhexidine Gluconate Cloth  6 each Topical Q0600   Chlorhexidine Gluconate Cloth  6 each Topical Q0600   darbepoetin (ARANESP) injection - DIALYSIS  40 mcg Intravenous Q Mon-HD   feeding supplement (PROSource TF)  45 mL Per Tube TID   guaiFENesin  5 mL Per Tube BID   insulin aspart  0-9 Units Subcutaneous Q4H   insulin glargine-yfgn  25 Units Subcutaneous Daily   lidocaine  1 patch Transdermal Q24H   lipase/protease/amylase)  20,880 Units Per Tube Once   And   sodium bicarbonate  650 mg Per Tube Once   mouth rinse  15 mL Mouth Rinse 10 times per day   metaxalone  400 mg Per Tube TID   midodrine  5 mg Per Tube Q8H   multivitamin  1  tablet Per Tube QHS   pantoprazole sodium  40 mg Per Tube QHS   sertraline  100 mg Per Tube Daily   sodium chloride flush  10-40 mL Intracatheter Q12H    have reviewed scheduled and prn medications.  Physical Exam: General:NAD, comfortable on tracheostomy/on vent.  Eyes open and tracks this AM Heart:RRR, s1s2 nl Lungs: Coarse breath sounds bilaterally, no wheezing Abdomen:soft, Non-tender, PEG tube and ostomy bag Extremities:Trace edema Dialysis Access: Right IJ TDC  Luis Orr 09/04/2020,10:43 AM  LOS: 55 days

## 2020-09-04 NOTE — Progress Notes (Addendum)
[facility] PROGRESS NOTE  Luis Orr HYW:[id number] DOB: 16-Aug-1950 DOA: 06/20/2020 PCP: Townsend Roger, MD  Status: Remains inpatient appropriate because:Hemodynamically unstable, Persistent severe electrolyte disturbances, Unsafe d/c plan, and Inpatient level of care appropriate due to severity of illness  Dispo: The patient is from: [facility] vent capable              Anticipated d/c is to: SNF Vent capable w/ access to in facility/stretcher based HD              Patient currently is not medically stable to d/c.   Difficult to place patient Yes    Level of care: ICU  Code Status: Partial (no CPR and no defibrillation or cardioversion) Family Communication: [name] on 8/22.  Of note the wife is also the POA DVT prophylaxis: Eliquis COVID vaccination status: Unknown    HPI:  70 y.o. 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 Patient presented secondary to altered mental status and hypotension with recurrent pneumonia requiring vasopressor support and ICU admission.he has had multiple recent admissions for sepsis secondary to various infections including UTIs, pneumonias and bacteremia in setting of MDR Klebsiella, MRSA and pseudomonas. Most recent admission September 2021 in setting of sepsis due to MDR Klebsiella with right ureteral calculus s/p double J ureteral stent placement and treated with meropenem x 10days. He was discharged back to [facility].  ICU significant events: 6/29 admitted to ICU for septic shock on levo to maintain MAP>65, continued on vent support 6/29 Blood Cx>> staph species in 1 of 4 cultures drawn>> suspect contaminant 6/30 Off pressors 7/2 ID Consult for MDR acinetobacter in trach asp, Meropenem changed to unasyn 7/2 PRBC transfusion, iron replacement 7/5 Pressors added again. Central line placed. Drainage from gastrostomy tube >> CT Abd without fluid collection around  gastrostomy tube but with diffuse body wall edema. Echo EF 50-55%, mild LVH 7/6 Renal Replacement Therapy ; Code status changed to partial Code (DNR) 7/7 Transfuse 1U PRBC 7/9 add solu cortef 7/10 off pressors, weaning stress steroids 7/11 Pressors added back on again 7/12 ID consult due to rising WBC, hypothermia, hypotension concern for worsening infection. Vancomycin added. Repeat blood cx collected. 7/15 CRRT stopped 7/17 Restarted on low dose levo. No evidence of new infection. Held off on antibiotics. Nephrology contacted family and decision was made to continue iHD this week to allow additional time for renal recovery. Patient is not the best long term iHD candidate but family wants aggressive care 7/18 plans for iHD, tolerated with 2L removed 719 slight drop in hgb to 6.9 will transfuse 1 unit PRBC 7/22 > 7/26: Tolerating HD. TOC consulted for placement. Will likely need out of state placement. 8/15 WBCs greater than 28,000, hypoglycemia, procalcitonin >18, chest x-ray consistent with pneumonia, recent blood cultures consistent with contamination.  Unasyn IV resumed after sputum culture obtained 8/16 consultation placed with ethics committee regarding evaluation for futility of care 8/16 Sputum cx + for Acinetobacter with sensitivities pending 8/17 extensive conversation held with patient's wife regarding patient's poor prognosis and reemergence of Acinetobacter VAP.  Also discussed with her that patient has been able to convey to multiple staff that he does not want to continue on the ventilator or with dialysis.  Wife reports that the patient does not understand what he is saying, she is the POA and that she wants to continue aggressive care. 8/18 patient now telling nephrology, myself and the rest of the staff he  replies yes to wanting to remain on dialysis and on the ventilator and this direct question is asked. 8/18 Acinetobacter sensitive to Unasyn only.  Discussed with ID/Dr. Juleen China.   Plan is to administer antibiotics for 7-day since patient has not had any increased O2 needs and remains afebrile.  If any concerns over worsening symptoms can give an additional 3 days. Developed moderate bleeding around tracheostomy site on 8/19.  Likely related to combination of Eliquis and heparin required for CRRT.  We will hold Eliquis for now and monitor degree of bleeding Eliquis resumed on 8/22.  Overnight into 8/23 patient bit his tongue and had bleeding.  Also had some bleeding around trach likely from oral bleeding.  Also though did have bleeding from old central line site. 8/23 had transient bleeding around trach and prior central line insertion site overnight.  Patient had bitten tongue and this likely explain the bleeding.  This morning patient was not having any bleeding.   Subjective: Patient awake but slightly groggy.  Suspect he recently received pain medication for his back.  States back pain is better.  Is not remember biting his tongue - he does remember coughing a lot yesterday.  Objective: Vitals:   09/04/20 0600 09/04/20 0730  BP: 91/63 104/71  Pulse:  85  Resp: 20 18  Temp:    SpO2:  100%    Intake/Output Summary (Last 24 hours) at 09/04/2020 0746 Last data filed at 09/03/2020 1727 Gross per 24 hour  Intake 602.89 ml  Output 2050 ml  Net -1447.11 ml   Filed Weights   09/03/20 1427 09/03/20 1727 09/04/20 0500  Weight: 81 kg 79 kg 76.4 kg    Exam:  Constitutional: NAD, awake and slightly groggy Respiratory: Trach: 6.0 XLT cuffed, Vent settings: PRVC mode, Fio2 40%, rate 20, VT 510, PEEP 5, lung sounds seem more clear today and somewhat decreased in the bases.  Espiratory effort per vent (diaphragm paralyzed).  No coughing or bleeding around tracheostomy insertion site Cardiovascular: Maintaining SR, normotensive.  Focal dependent bilateral hand edema proving.  1+ pedal pulses. No JVD Abdomen: LBM 8/23, PEG site unremarkable, left lower quadrant colostomy in  place, abdomen soft and nondistended with normoactive bowel sounds. Neurologic: CN 2-12 grossly intact on visual inspection- patient with quadriplegia so unable to independently move extremities.  No definitive sensation of extremities when checked. Psychiatric: Awake and nods head appropriately to questions asked.  Unable to accurately assess orientation since he is unable to phonate.   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 No further bleeding around tracheostomy site for the past 48 hours plus therefore will resume Eliquis and continue to monitor  Recurrent ventilator associated pneumonia 2/2 recurrent Acinetobacter/sepsis without shock WBC trend: 28.7 > 26.9 > 28.8 > 33.9 > 29.6 > 27.8 > 24.5 > 23.5 Procalcitonin trend: 18.73 > 14.42 > 5.54 > 6.35 > 5.39 > 4.42 > 3.61 > 3.03 > 3.55 CRP trend: 28.4 > 25.5 > 25.4 > 19.7 Lactic acid normal  Blood cultures with contaminant 8/16 sputum culture + Acinetobacter Baumanii and as of 8/18 sensitive to Unasyn only. 8/18 Discussed with ID/Dr Juleen China recommends 7 days of therapy but if clinically decompensates recommends an additional 3 days more Patient remains afebrile, hemodynamically stable with WBC, PCT and CRP all trending downward.  Of  8/23 appears to be slowly improving clinically although WBC has not normalized. @Presented  initially with ventilator associated pneumonia secondary to Acinetobacter and completed 14 days of Unasyn@   AKI with oliguria/volume overload/ New CKD requiring HD Anuric Nephrology following, initially required CRRT and has transition to HD (due to staffing issues continues on CRRT at bedside) Palliative care has been consulted and are following.  TDC placed on  7/26-family wants aggressive measures so anticipate will need permanent access   Septic shock 2/2 VAP POA Presented with sepsis physiology and profound shock which has now resolved  Chronic thoracic back pain Continue Oxy and fentanyl prn Better on scheduled Skelaxin and lidocaine patch  History of paroxysmal atrial fibrillation Patient was on amiodarone prior to admission -currently maintaining sinus rhythm Eliquis resumed 8/22 TSH was WNL this admission QTc 420 ms on 9/16   Acute metabolic encephalopathy Resolved Secondary to acute illness. CT head negative for acute process.  Back at baseline.   Diabetes mellitus, type 2 Currently on Semglee 25 units daily.  CBG is down in the 70s due to recent issues related to clogged PEG tube and inability to administer tube feeding.  Hold the 25 units today and give only 10 units for now. 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  Resolved. 8/23 total bilirubin, AST and ALT have all normalized  Quadriplegia secondary to compressive myelopathy.  Previously resided at [facility] but with poor performance scores as well as poor rehabilitation potential [facility] will not accept him back if he is HD dependent (he must be able to sit up in a chair for dialysis.)   Microcytic anemia/Acute anemia superimposed on anemia of chronic kidney disease Patient required 4 units of PRBC to date. Thought to be secondary to a combination of acute illness, chronic disease and iron deficiency. IV iron given.  Hemoglobin on 8/23 was 8.8 Continue Aranesp   Severe protein calorie malnutrition Nutrition Problem: Increased nutrient needs Etiology: wound healing Signs/Symptoms: estimated  needs Interventions: Tube feeding, Prostat, MVI Estimated body mass index is 27.19 kg/m as calculated from the following:   Height as of this encounter: 5' 6"  (1.676 m).   Weight as of this encounter: 76.4 kg.  PEG tube clogged overnight into 8/23.  Standard aggressive flushing did not resolve obstruction.  Tube declog protocol/orders initiated.  Patient was given pancreatic enzymes with sodium bicarbonate with resolution of obstruction  Pressure injury Stage IV medial/posterior sacrum present on admission.  Right/posterior/lateral ankle unknown if present on admission.  DTI left ear, not present on admission. (For additional documentation see wound care notes)    Data Reviewed: Basic Metabolic Panel: Recent Labs  Lab 08/30/20 0907 08/31/20 0419 09/01/20 0428 09/02/20 0347 09/04/20 0437  NA 135 133* 132* 133* 134*  K 4.2 4.2 3.3* 3.3* 4.4  CL 98 96* 93* 96* 97*  CO2 25 23 26 24 26   GLUCOSE 220* 181* 152* 190* 104*  BUN 75* 92* 52* 70* 47*  CREATININE 1.36* 1.69* 1.11 1.48* 1.15  CALCIUM 9.3 9.6 9.0 8.8* 8.7*   Liver Function Tests: Recent Labs  Lab 08/29/20 0308 08/31/20 0419 09/04/20 0437  AST 50* 33 30  ALT 36 27 21  ALKPHOS 344* 261* 223*  BILITOT 0.8 1.0 0.5  PROT 7.2 6.7  7.2  ALBUMIN 1.9* 1.6* 1.7*   No results for input(s): LIPASE, AMYLASE in the last 168 hours. No results for input(s): AMMONIA in the last 168 hours. CBC: Recent Labs  Lab 08/29/20 0308 08/30/20 0907 08/31/20 0419 09/01/20 0428 09/02/20 0347 09/03/20 1855 09/04/20 0437  WBC 28.8* 33.9* 29.6* 27.8* 24.5*  --  23.5*  NEUTROABS 24.2* 27.8* 23.0*  --   --   --  17.9*  HGB 10.3* 9.4* 8.7* 8.7* 8.7* 9.4* 8.8*  HCT 32.2* 29.3* 27.7* 26.9* 26.9* 29.1* 28.2*  MCV 81.9 83.0 82.9 82.3 82.0  --  82.5  PLT 504* 487* 509* 478* 463*  --  479*   Cardiac Enzymes: No results for input(s): CKTOTAL, CKMB, CKMBINDEX, TROPONINI in the last 168 hours. BNP (last 3 results) Recent Labs     07/10/2020 1922  BNP 135.0*    ProBNP (last 3 results) No results for input(s): PROBNP in the last 8760 hours.  CBG: Recent Labs  Lab 09/03/20 1208 09/03/20 1548 09/03/20 1939 09/03/20 2346 09/04/20 0446  GLUCAP 170* 133* 153* 108* 101*       Studies: No results found.  Scheduled Meds:  amiodarone  200 mg Per Tube Daily   apixaban  5 mg Oral BID   buPROPion  75 mg Per Tube BID   chlorhexidine gluconate (MEDLINE KIT)  15 mL Mouth Rinse BID   Chlorhexidine Gluconate Cloth  6 each Topical Q0600   Chlorhexidine Gluconate Cloth  6 each Topical Q0600   darbepoetin (ARANESP) injection - DIALYSIS  40 mcg Intravenous Q Mon-HD   feeding supplement (PROSource TF)  45 mL Per Tube TID   guaiFENesin  5 mL Per Tube BID   insulin aspart  0-9 Units Subcutaneous Q4H   insulin glargine-yfgn  25 Units Subcutaneous Daily   lidocaine  1 patch Transdermal Q24H   mouth rinse  15 mL Mouth Rinse 10 times per day   metaxalone  400 mg Per Tube TID   midodrine  5 mg Per Tube Q8H   multivitamin  1 tablet Per Tube QHS   pantoprazole sodium  40 mg Per Tube QHS   sertraline  100 mg Per Tube Daily   sodium chloride flush  10-40 mL Intracatheter Q12H   Continuous Infusions:  sodium chloride Stopped (07/26/20 0824)   sodium chloride 500 mL (09/01/20 1408)   sodium chloride     feeding supplement (NEPRO CARB STEADY) 1,000 mL (09/03/20 0827)    Active Problems:   Septic shock (HCC)   Pressure injury of skin   Acute respiratory failure with hypoxia (HCC)   AKI (acute kidney injury) ([place])   Hypotension   Goals of care, counseling/discussion   Quadriplegia ([place])   Chronic kidney disease requiring chronic dialysis ([place])   Hyperglycemia   Sepsis due to Acinetobacter baumannii ([place])   Pneumonia due to Acinetobacter species ([place])   Dysautonomia ([place])   Chronic midline thoracic back pain   Consultants: PCCM Nephrology Infectious disease Palliative care  medicine  Procedures: Echocardiogram Insertion of double-lumen HD catheter  Antibiotics: Cefepime x1 dose Meropenem 6/29 through 7/2 Vancomycin 6/29 through 6/30 Unasyn 7/2 through 7/15 Unasyn 8/15 >   Time spent: 35 minutes    Erin Hearing ANP  [facility] 7 am - 330 pm/M-F for direct patient care and secure chat Please refer to Amion for contact info 55  days

## 2020-09-05 ENCOUNTER — Inpatient Hospital Stay (HOSPITAL_COMMUNITY): Payer: 59

## 2020-09-05 DIAGNOSIS — N186 End stage renal disease: Secondary | ICD-10-CM

## 2020-09-05 DIAGNOSIS — Z992 Dependence on renal dialysis: Secondary | ICD-10-CM | POA: Diagnosis not present

## 2020-09-05 DIAGNOSIS — Z7901 Long term (current) use of anticoagulants: Secondary | ICD-10-CM | POA: Diagnosis not present

## 2020-09-05 LAB — GLUCOSE, CAPILLARY
Glucose-Capillary: 106 mg/dL — ABNORMAL HIGH (ref 70–99)
Glucose-Capillary: 110 mg/dL — ABNORMAL HIGH (ref 70–99)
Glucose-Capillary: 113 mg/dL — ABNORMAL HIGH (ref 70–99)
Glucose-Capillary: 115 mg/dL — ABNORMAL HIGH (ref 70–99)
Glucose-Capillary: 132 mg/dL — ABNORMAL HIGH (ref 70–99)
Glucose-Capillary: 175 mg/dL — ABNORMAL HIGH (ref 70–99)

## 2020-09-05 LAB — COMPREHENSIVE METABOLIC PANEL
ALT: 20 U/L (ref 0–44)
AST: 26 U/L (ref 15–41)
Albumin: 1.7 g/dL — ABNORMAL LOW (ref 3.5–5.0)
Alkaline Phosphatase: 204 U/L — ABNORMAL HIGH (ref 38–126)
Anion gap: 13 (ref 5–15)
BUN: 68 mg/dL — ABNORMAL HIGH (ref 8–23)
CO2: 24 mmol/L (ref 22–32)
Calcium: 8.8 mg/dL — ABNORMAL LOW (ref 8.9–10.3)
Chloride: 96 mmol/L — ABNORMAL LOW (ref 98–111)
Creatinine, Ser: 1.51 mg/dL — ABNORMAL HIGH (ref 0.61–1.24)
GFR, Estimated: 49 mL/min — ABNORMAL LOW (ref >60)
Glucose, Bld: 145 mg/dL — ABNORMAL HIGH (ref 70–99)
Potassium: 4.4 mmol/L (ref 3.5–5.1)
Sodium: 133 mmol/L — ABNORMAL LOW (ref 135–145)
Total Bilirubin: 0.5 mg/dL (ref 0.3–1.2)
Total Protein: 6.9 g/dL (ref 6.5–8.1)

## 2020-09-05 LAB — CBC
HCT: 27 % — ABNORMAL LOW (ref 39.0–52.0)
Hemoglobin: 8.6 g/dL — ABNORMAL LOW (ref 13.0–17.0)
MCH: 26.2 pg (ref 26.0–34.0)
MCHC: 31.9 g/dL (ref 30.0–36.0)
MCV: 82.3 fL (ref 80.0–100.0)
Platelets: 445 10*3/uL — ABNORMAL HIGH (ref 150–400)
RBC: 3.28 MIL/uL — ABNORMAL LOW (ref 4.22–5.81)
RDW: 19.6 % — ABNORMAL HIGH (ref 11.5–15.5)
WBC: 22.1 10*3/uL — ABNORMAL HIGH (ref 4.0–10.5)
nRBC: 1 % — ABNORMAL HIGH (ref 0.0–0.2)

## 2020-09-05 LAB — PROCALCITONIN: Procalcitonin: 3.53 ng/mL

## 2020-09-05 LAB — MAGNESIUM: Magnesium: 2.2 mg/dL (ref 1.7–2.4)

## 2020-09-05 LAB — PHOSPHORUS: Phosphorus: 5.4 mg/dL — ABNORMAL HIGH (ref 2.5–4.6)

## 2020-09-05 MED ORDER — METHOCARBAMOL 1000 MG/10ML IJ SOLN
500.0000 mg | Freq: Once | INTRAVENOUS | Status: AC
  Administered 2020-09-05: 500 mg via INTRAVENOUS
  Filled 2020-09-05: qty 500

## 2020-09-05 MED ORDER — METAXALONE 800 MG PO TABS
800.0000 mg | ORAL_TABLET | Freq: Three times a day (TID) | ORAL | Status: DC
  Administered 2020-09-05 – 2020-10-20 (×136): 800 mg
  Filled 2020-09-05 (×144): qty 1

## 2020-09-05 MED ORDER — PANCRELIPASE (LIP-PROT-AMYL) 10440-39150 UNITS PO TABS
20880.0000 [IU] | ORAL_TABLET | Freq: Once | ORAL | Status: AC
  Administered 2020-09-05: 20880 [IU]
  Filled 2020-09-05: qty 2

## 2020-09-05 MED ORDER — APIXABAN 5 MG PO TABS
5.0000 mg | ORAL_TABLET | Freq: Two times a day (BID) | ORAL | Status: DC
  Administered 2020-09-05: 5 mg
  Filled 2020-09-05: qty 1

## 2020-09-05 MED ORDER — SODIUM BICARBONATE 650 MG PO TABS
650.0000 mg | ORAL_TABLET | Freq: Once | ORAL | Status: DC
  Filled 2020-09-05: qty 1

## 2020-09-05 MED ORDER — HEPARIN SODIUM (PORCINE) 1000 UNIT/ML IJ SOLN
INTRAMUSCULAR | Status: AC
  Filled 2020-09-05: qty 4

## 2020-09-05 MED ORDER — MORPHINE SULFATE (PF) 2 MG/ML IV SOLN
1.0000 mg | INTRAVENOUS | Status: DC | PRN
  Administered 2020-09-13 – 2020-10-02 (×2): 2 mg via INTRAVENOUS
  Filled 2020-09-05 (×3): qty 1

## 2020-09-05 NOTE — TOC Progression Note (Signed)
Transition of Care [facility]) - Progression Note    Patient Details  Name: Luis Orr MRN: [id number] Date of Birth: 28-May-1950  Transition of [facility]) CM/SW [place], RN Phone Number: 09/05/2020, 2:29 PM  Clinical Narrative:    Case management called and spoke with Sydnee Cabal, CM at [facility] [phone] and they are able to provide ACLS transport to the [facility] in [place], [place] when the patient has a ready bed at the nursing facility at a later determined date after insurance authorization and confirmed bed availability.  I provided the [employer] with [employer] provider information and they are able to bill for services and provide transportation.  I will call back at a later date when the patient is closer to transfer to the receiving facility.   Expected Discharge Plan: [place] Barriers to Discharge: Continued Medical Work up, No SNF bed, Vent Bed not available  Expected Discharge Plan and Services Expected Discharge Plan: [place]   Discharge Planning Services: CM Consult Post Acute Care Choice: [place] Living arrangements for the past 2 months: Post-Acute Facility                                       Social Determinants of Health (SDOH) Interventions    Readmission Risk Interventions No flowsheet data found.

## 2020-09-05 NOTE — Procedures (Signed)
Cortrak  Person Inserting Tube:  Jonnatan Hanners, RD Tube Type:  Cortrak - 43 inches Tube Size:  10 Tube Location:  Right nare Initial Placement:  Postpyloric Secured by: Bridle Technique Used to Measure Tube Placement:  Marking at nare/corner of mouth Cortrak Secured At:  85 cm Procedure Comments:  Cortrak Tube Team Note:  Consult received to place a Cortrak feeding tube.   X-ray is required, abdominal x-ray has been ordered by the Cortrak team. Please confirm tube placement before using the Cortrak tube.   If the tube becomes dislodged please keep the tube and contact the Cortrak team at [email or web address] ([email or web address]) for replacement.  If after hours and replacement cannot be delayed, place a NG tube and confirm placement with an abdominal x-ray.    Mariana Single MS, RD, LDN, CNSC Clinical Nutrition Pager listed in [place]

## 2020-09-05 NOTE — Progress Notes (Signed)
BUE vein mapping has been completed.   Results can be found under chart review under CV PROC. 09/05/2020 12:21 PM Lindey Renzulli RVT, RDMS

## 2020-09-05 NOTE — Consult Note (Signed)
[place] Nurse wound follow up Patient receiving care in [place] requested by Erin Hearing, NP however the [place] team does not have [place] or [place] to take photos. This patient is not a candidate for surgical consult or hydrotherapy. Will DC to a SNF in [place]. Wound type: Chronic Stage 4 PI Measurement: 6 cm x 4 cm x 5 cm with induration from ten o'clock to six o'clock measuring 6 cm in depth. Wound bed: Red/pink with no slough or eschar present.  Drainage (amount, consistency, odor) serous on the dressing removed.  Periwound: intact Dressing procedure/placement/frequency: This wound is evolving with induration and is high risk to tunnel at some point. We will continue moistened saline gauze packed lightly into the induration areas and into the center of the wound. Secure with sacral foam dressing. Change saline gauze BID.  Monitor the wound area(s) for worsening of condition such as: Signs/symptoms of infection, increase in size, development of or worsening of odor, development of pain, or increased pain at the affected locations.   Notify the medical team if any of these develop.  Thank you for the consult. [place] nurse will not follow at this time.   Please re-consult the [place] team if needed.  Cathlean Marseilles Tamala Julian, MSN, RN, [place], Lysle Pearl, [facility] Wound Treatment Associate Pager [phone]

## 2020-09-05 NOTE — Progress Notes (Signed)
Interventional Radiology Brief Note:  PA to bedside due to report of G-tube clogged again.  Plan to replace G-tube, however patient's current tube/size is on back order.  Successful declog of gastrostomy performed at bedside. RN aware.   Note, patient with evidence of fresh blood on lips and around G-tube stoma.  No active oozing.  No injury noted.  G-tube flushes easily.  No clots or evidence of active bleeding.   Brynda Greathouse, MS RD PA-C

## 2020-09-05 NOTE — Progress Notes (Addendum)
[facility] PROGRESS NOTE  Luis Orr KDX:[id number] DOB: 07/28/1950 DOA: 06/30/2020 PCP: Townsend Roger, MD  Status: Remains inpatient appropriate because:Hemodynamically unstable, Persistent severe electrolyte disturbances, Unsafe d/c plan, and Inpatient level of care appropriate due to severity of illness  Dispo: The patient is from: [facility] vent capable              Anticipated d/c is to: SNF Vent capable w/ access to in facility/stretcher based HD              Patient currently is not medically stable to d/c.   Difficult to place patient Yes    Level of care: ICU  Code Status: Partial (no CPR and no defibrillation or cardioversion) Family Communication: Wife June on 8/22.  Of note the wife is also the POA DVT prophylaxis: Eliquis COVID vaccination status: Unknown    HPI:  70 y.o. 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 Patient presented secondary to altered mental status and hypotension with recurrent pneumonia requiring vasopressor support and ICU admission.he has had multiple recent admissions for sepsis secondary to various infections including UTIs, pneumonias and bacteremia in setting of MDR Klebsiella, MRSA and pseudomonas. Most recent admission September 2021 in setting of sepsis due to MDR Klebsiella with right ureteral calculus s/p double J ureteral stent placement and treated with meropenem x 10days. He was discharged back to [facility].  ICU significant events: 6/29 admitted to ICU for septic shock on levo to maintain MAP>65, continued on vent support 6/29 Blood Cx>> staph species in 1 of 4 cultures drawn>> suspect contaminant 6/30 Off pressors 7/2 ID Consult for MDR acinetobacter in trach asp, Meropenem changed to unasyn 7/2 PRBC transfusion, iron replacement 7/5 Pressors added again. Central line placed. Drainage from gastrostomy tube >> CT Abd without fluid collection around  gastrostomy tube but with diffuse body wall edema. Echo EF 50-55%, mild LVH 7/6 Renal Replacement Therapy ; Code status changed to partial Code (DNR) 7/7 Transfuse 1U PRBC 7/9 add solu cortef 7/10 off pressors, weaning stress steroids 7/11 Pressors added back on again 7/12 ID consult due to rising WBC, hypothermia, hypotension concern for worsening infection. Vancomycin added. Repeat blood cx collected. 7/15 CRRT stopped 7/17 Restarted on low dose levo. No evidence of new infection. Held off on antibiotics. Nephrology contacted family and decision was made to continue iHD this week to allow additional time for renal recovery. Patient is not the best long term iHD candidate but family wants aggressive care 7/18 plans for iHD, tolerated with 2L removed 719 slight drop in hgb to 6.9 will transfuse 1 unit PRBC 7/22 > 7/26: Tolerating HD. TOC consulted for placement. Will likely need out of state placement. 8/15 WBCs greater than 28,000, hypoglycemia, procalcitonin >18, chest x-ray consistent with pneumonia, recent blood cultures consistent with contamination.  Unasyn IV resumed after sputum culture obtained 8/16 consultation placed with ethics committee regarding evaluation for futility of care 8/16 Sputum cx + for Acinetobacter with sensitivities pending 8/17 extensive conversation held with patient's wife regarding patient's poor prognosis and reemergence of Acinetobacter VAP.  Also discussed with her that patient has been able to convey to multiple staff that he does not want to continue on the ventilator or with dialysis.  Wife reports that the patient does not understand what he is saying, she is the POA and that she wants to continue aggressive care. 8/18 patient now telling nephrology, myself and the rest of the staff he  replies yes to wanting to remain on dialysis and on the ventilator and this direct question is asked. 8/18 Acinetobacter sensitive to Unasyn only.  Discussed with ID/Dr. Juleen China.   Plan is to administer antibiotics for 7-day since patient has not had any increased O2 needs and remains afebrile.  If any concerns over worsening symptoms can give an additional 3 days. Developed moderate bleeding around tracheostomy site on 8/19.  Likely related to combination of Eliquis and heparin required for CRRT.  We will hold Eliquis for now and monitor degree of bleeding Eliquis resumed on 8/22.  Overnight into 8/23 patient bit his tongue and had bleeding.  Also had some bleeding around trach likely from oral bleeding.  Also though did have bleeding from old central line site. 8/23 had transient bleeding around trach and prior central line insertion site overnight.  Patient had bitten tongue and this likely explain the bleeding.  This morning patient was not having any bleeding.   Subjective: Awake.  Reports continued issues with back pain but somewhat better after the addition of recent medications.  Has a new laceration on lip and tongue and he does not remember accidentally biting.  Objective: Vitals:   09/05/20 0500 09/05/20 0600  BP: 98/70 (!) 117/91  Pulse: 81 82  Resp: 20 20  Temp:    SpO2: 98% 98%    Intake/Output Summary (Last 24 hours) at 09/05/2020 0733 Last data filed at 09/05/2020 0600 Gross per 24 hour  Intake 585.75 ml  Output 175 ml  Net 410.75 ml   Filed Weights   09/03/20 1427 09/03/20 1727 09/04/20 0500  Weight: 81 kg 79 kg 76.4 kg    Exam:  Constitutional: NAD, awake and alert Respiratory: Trach: 6.0 XLT cuffed, Vent settings: PRVC mode, Fio2 40%, rate 20, VT 510, PEEP 5; lung sounds are coarse with expiratory rhonchi today primarily in the mid fields anteriorly expiratory effort per vent (diaphragm paralyzed).  Tracheostomy tube exchanged yesterday and patient with frothy pink-tinged drainage.  RT has confirmed cuff is up Cardiovascular: Maintaining SR, normotensive.  Focal dependent bilateral hand edema improving.  1+ pedal pulses. No JVD Abdomen: LBM  8/23, PEG site unremarkable, left lower quadrant colostomy in place, abdomen soft and nondistended with normoactive bowel sounds. Neurologic: CN 2-12 grossly intact on visual inspection- patient with quadriplegia so unable to independently move extremities.  No definitive sensation of extremities when checked. Psychiatric: Awake and nods head appropriately to questions asked.     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 No further bleeding around tracheostomy site for the past 48 hours plus therefore will resume Eliquis and continue to monitor  Recurrent ventilator associated pneumonia 2/2 recurrent Acinetobacter/sepsis without shock WBC trend: 28.7 > 26.9 > 28.8 > 33.9 > 29.6 > 27.8 > 24.5 > 23.5 > 22.1 Procalcitonin trend: 18.73 > 14.42 > 5.54 > 6.35 > 5.39 > 4.42 > 3.61 > 3.03 > 3.55 > 3.53 CRP trend: 28.4 > 25.5 > 25.4 > 19.7 8/16 sputum culture + Acinetobacter Baumanii and as of 8/18 sensitive to Unasyn only. 8/18 Discussed with ID/Dr Juleen China recommends 7 days of therapy -completed on 8/22 @Presented  initially with ventilator associated pneumonia secondary to Acinetobacter and completed 14 days of Unasyn@   AKI with oliguria/volume overload/ New CKD requiring HD Anuric Nephrology following, initially  required CRRT and has transition to HD (due to staffing issues continues on CRRT at bedside) Palliative care has been consulted and are following.  [place] placed on 7/26-nephrology aware of need for permanent access before discharge.  I will contact [facility]   Septic shock 2/2 VAP POA Presented with sepsis physiology and profound shock which has now resolved  Chronic thoracic back pain Continue Oxy and fentanyl prn Better on scheduled  Skelaxin and lidocaine patch-we will increase Skelaxin to 800 3 times daily scheduled; since PEG tube clogged give one-time dose Robaxin IV and allow for morphine IV until PEG replaced  History of paroxysmal atrial fibrillation Patient was on amiodarone prior to admission -currently maintaining sinus rhythm Eliquis resumed 8/22-a.m. dose not yet given because PEG tube is clogged.  If unable to unclog or get replaced in a timely manner will need to transition to subcutaneous heparin TSH was WNL this admission QTc 420 ms on 1/75   Acute metabolic encephalopathy Resolved Secondary to acute illness. CT head negative for acute process.  Back at baseline.   Diabetes mellitus, type 2 Currently on Semglee 25 units daily.  CBG is down in the 70s due to recent issues related to clogged PEG tube and inability to administer tube feeding.  Hold the 25 units today and give only 10 units for now. 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  Resolved. 8/23 total bilirubin, AST and ALT have all normalized  Quadriplegia secondary to compressive myelopathy.  Previously resided at [facility] but with poor performance scores as well as poor rehabilitation potential [facility] will not accept him back if he is HD dependent (he must be able to sit up in a chair for dialysis.)   Microcytic anemia/Acute anemia superimposed on anemia of chronic kidney disease Patient required 4 units of PRBC to date. Thought to be secondary to a combination of acute illness, chronic disease and iron deficiency. IV iron given.  Hemoglobin on 8/23 was 8.8 Continue Aranesp   Severe protein calorie malnutrition Nutrition Problem: Increased nutrient needs Etiology: wound  healing Signs/Symptoms: estimated needs Interventions: Tube feeding, Prostat, MVI Estimated body mass index is 27.19 kg/m as calculated from the following:   Height as of this encounter: 5' 6"  (1.676 m).   Weight as of this encounter: 76.4 kg.  Patient was given pancreatic enzymes with sodium bicarbonate with resolution of obstruction 8/23 but tube became obstructed again overnight and 8/24.  Unsuccessful with attempt at pancreatic enzyme/sodium bicarb and flush.  Pressure injury/stage IV sacral decubitus Stage IV medial/posterior sacrum present on admission.  Right/posterior/lateral ankle unknown if present on admission.  DTI left ear, not present on admission. (For additional documentation see wound care notes) 8/24 nursing states that stage IV decubitus on the sacrum is looking worse.  Currently has normal saline dressings every shift.  Wound care team contacted to reevaluate wound.  I suspect patient will need enzymatic treatment along with hydrotherapy.  I have asked wound care to take pictures to document appearance of wound.    Data Reviewed: Basic Metabolic Panel: Recent Labs  Lab 08/31/20 0419 09/01/20 0428 09/02/20 0347 09/04/20 0437 09/05/20 0349  NA 133* 132* 133* 134* 133*  K 4.2 3.3* 3.3* 4.4 4.4  CL 96* 93* 96* 97* 96*  CO2 23 26 24 26 24   GLUCOSE 181* 152* 190* 104* 145*  BUN 92* 52* 70* 47* 68*  CREATININE 1.69* 1.11 1.48* 1.15 1.51*  CALCIUM 9.6 9.0 8.8* 8.7* 8.8*  MG  --   --   --   --  2.2  PHOS  --   --   --   --  5.4*   Liver Function Tests: Recent Labs  Lab 08/31/20 0419 09/04/20 0437 09/05/20 0349  AST 33 30 26  ALT 27 21 20   ALKPHOS 261* 223* 204*  BILITOT 1.0 0.5 0.5  PROT 6.7 7.2 6.9  ALBUMIN 1.6* 1.7* 1.7*   No results for input(s): LIPASE, AMYLASE in the last 168 hours. No results for input(s): AMMONIA in the last 168 hours. CBC: Recent Labs  Lab 08/30/20 0907 08/31/20 0419 09/01/20 0428 09/02/20 0347 09/03/20 1855 09/04/20 0437  09/05/20 0349  WBC 33.9* 29.6* 27.8* 24.5*  --  23.5* 22.1*  NEUTROABS 27.8* 23.0*  --   --   --  17.9*  --   HGB 9.4* 8.7* 8.7* 8.7* 9.4* 8.8* 8.6*  HCT 29.3* 27.7* 26.9* 26.9* 29.1* 28.2* 27.0*  MCV 83.0 82.9 82.3 82.0  --  82.5 82.3  PLT 487* 509* 478* 463*  --  479* 445*   Cardiac Enzymes: No results for input(s): CKTOTAL, CKMB, CKMBINDEX, TROPONINI in the last 168 hours. BNP (last 3 results) Recent Labs    06/30/2020 1922  BNP 135.0*    ProBNP (last 3 results) No results for input(s): PROBNP in the last 8760 hours.  CBG: Recent Labs  Lab 09/04/20 1130 09/04/20 1541 09/04/20 1942 09/04/20 2334 09/05/20 0347  GLUCAP 74 99 103* 122* 132*       Studies: No results found.  Scheduled Meds:  amiodarone  200 mg Per Tube Daily   apixaban  5 mg Oral BID   buPROPion  75 mg Per Tube BID   chlorhexidine gluconate (MEDLINE KIT)  15 mL Mouth Rinse BID   Chlorhexidine Gluconate Cloth  6 each Topical Q0600   Chlorhexidine Gluconate Cloth  6 each Topical Q0600   darbepoetin (ARANESP) injection - DIALYSIS  40 mcg Intravenous Q Mon-HD   feeding supplement (PROSource TF)  45 mL Per Tube TID   guaiFENesin  5 mL Per Tube BID   insulin aspart  0-9 Units Subcutaneous Q4H   insulin glargine-yfgn  25 Units Subcutaneous Daily   lidocaine  1 patch Transdermal Q24H   mouth rinse  15 mL Mouth Rinse 10 times per day   metaxalone  400 mg Per Tube TID   midodrine  5 mg Per Tube Q8H   multivitamin  1 tablet Per Tube QHS   pantoprazole sodium  40 mg Per Tube QHS   scopolamine  1 patch Transdermal Q72H   sertraline  100 mg Per Tube Daily   sodium chloride flush  10-40 mL Intracatheter Q12H   Continuous Infusions:  sodium chloride Stopped (07/26/20 0824)   sodium chloride 500 mL (09/01/20 1408)   sodium chloride     feeding supplement (NEPRO CARB STEADY) 1,000 mL (09/04/20 1259)    Active Problems:   Septic shock (HCC)   Pressure injury of skin   Acute respiratory failure with  hypoxia (HCC)   AKI (acute kidney injury) ([place])   Hypotension   Goals of care, counseling/discussion   Quadriplegia ([place])   Chronic kidney disease requiring chronic dialysis ([place])   Hyperglycemia   Sepsis due to Acinetobacter baumannii ([place])  Pneumonia due to Acinetobacter species ([place])   Dysautonomia ([place])   Chronic midline thoracic back pain   Consultants: PCCM Nephrology Infectious disease Palliative care medicine  Procedures: Echocardiogram Insertion of double-lumen HD catheter  Antibiotics: Cefepime x1 dose Meropenem 6/29 through 7/2 Vancomycin 6/29 through 6/30 Unasyn 7/2 through 7/15 Unasyn 8/15 >   Time spent: 35 minutes    Erin Hearing ANP  [facility] 7 am - 330 pm/M-F for direct patient care and secure chat Please refer to Amion for contact info 56  days

## 2020-09-05 NOTE — Progress Notes (Signed)
No charge Progress Note  Seen patient this morning.  Briefly reviewed chart.  Communicated with care team/TOC and Ms. Erin Hearing, NP.  As per [facility], now have a facility in [place] which can accommodate patient's ventilator and hemodialysis needs and spouse has excepted the bed offer.  He will need a permanent HD access (has TDC at this time) and vascular surgery have consulted.  Insurance authorization may also take several days.  PEG tube with recurrent complications, clogged and nonfunctioning this morning.  IR to consult for exchange of PEG tube.  Worsening stage IV decubitus ulcer per RN report, WOC RN to consult again.  Appears stable on the ventilator.  Awake and alert tracking activity around him.  Vernell Leep, MD, [place], [facility]. [facility]  To contact the attending provider between 7A-7P or the covering provider during after hours 7P-7A, please log into the web site [email or web address] and access using universal [place] password for that web site. If you do not have the password, please call the hospital operator.

## 2020-09-05 NOTE — Consult Note (Addendum)
Hospital Consult    Reason for Consult:  ESRD Requesting Physician:  Dr. Joelyn Oms MRN #:  [id number]  History of Present Illness: This is a 70 y.o. male with prolonged dependency on hemodialysis now end stage renal disease on intermittent HD. He has multiple chronic medical issues, ventilator dependent nd has been accepted to LTC SNF in [place]. He will need permanent HD access prior to transfer.  He has right IJ TDC in place and left subclavian central line. No pacemaker/chest implant. 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  He is interviewed and examined on the medical ICU where his attendant RN is at bedside. Patient is awake, alert with tracheostomy on ventilator. His next-of-kin is his wife.   Past Medical History:  Diagnosis Date   Anemia    Anxiety    Chronic respiratory failure (HCC)    DM2 (diabetes mellitus, type 2) (HCC)    Functional quadriplegia (HCC)    Hypertension    Multiple drug resistant organism (MDRO) culture positive    Urine retention    UTI (urinary tract infection)     Past Surgical History:  Procedure Laterality Date   COLOSTOMY     CYSTOSCOPY WITH STENT PLACEMENT Right 10/08/2019   Procedure: CYSTOSCOPY WITH  STENT PLACEMENT;  Surgeon: Lucas Mallow, MD;  Location: [facility];  Service: Urology;  Laterality: Right;   IR FLUORO GUIDE CV LINE LEFT  08/28/2020   IR FLUORO GUIDE CV LINE RIGHT  08/07/2020   IR FLUORO GUIDE CV LINE RIGHT  08/07/2020   IR US GUIDE VASC ACCESS LEFT  08/28/2020   IR US GUIDE VASC ACCESS RIGHT  08/07/2020   PEG PLACEMENT     ROTATOR CUFF REPAIR     TRACHEOSTOMY      Allergies  Allergen Reactions   Adhesive [Tape] Rash    Prior to Admission medications   Medication Sig Start Date End Date Taking? Authorizing Provider  acetaminophen (TYLENOL) 325 MG suppository Place 650 mg rectally every 6 (six) hours as needed for mild pain or fever (.=101).   Yes [provider]  Amino Acids-Protein Hydrolys  (FEEDING SUPPLEMENT, PRO-STAT 64,) LIQD Take 30 mLs by mouth daily.   Yes [provider]  amLODipine (NORVASC) 5 MG tablet Place 5 mg into feeding tube daily.   Yes [provider]  buPROPion (WELLBUTRIN) 75 MG tablet Place 75 mg into feeding tube 2 (two) times daily.   Yes [provider]  glycopyrrolate (ROBINUL) 1 MG tablet Place 1 mg into feeding tube every 8 (eight) hours.   Yes [provider]  losartan (COZAAR) 100 MG tablet Place 100 mg into feeding tube daily.   Yes [provider]  nutrition supplement, JUVEN, (JUVEN) PACK Place 1 packet into feeding tube in the morning and at bedtime.   Yes [provider]  Polyethyl Glycol-Propyl Glycol (SYSTANE) 0.4-0.3 % SOLN Place 1 drop into both eyes 2 (two) times daily.   Yes [provider]  rivaroxaban (XARELTO) 20 MG TABS tablet Take 20 mg by mouth daily with supper.   Yes [provider]  sodium zirconium cyclosilicate (LOKELMA) 10 g PACK packet Place 10 g into feeding tube daily.   Yes [provider]  acetaminophen (TYLENOL) 325 MG tablet Place 650 mg into feeding tube every 8 (eight) hours as needed for mild pain, fever or headache.    [provider]  amiodarone (PACERONE) 200 MG tablet Place 200 mg into feeding tube daily.  [provider]  carvedilol (COREG) 6.25 MG tablet Place 6.25 mg into feeding tube every 12 (twelve) hours. Patient not taking: Reported on 07/12/2020    [provider]  chlorhexidine (PERIDEX) 0.12 % solution 5 mLs by Mouth Rinse route every 12 (twelve) hours.     [provider]  clonazePAM (KLONOPIN) 0.5 MG tablet Place 1 tablet (0.5 mg total) into feeding tube every 8 (eight) hours. Patient taking differently: Place 0.5 mg into feeding tube 2 (two) times daily. 07/12/19   Allie Bossier, MD  diltiazem (CARDIZEM) 60 MG tablet Place 60 mg into feeding tube every 6 (six) hours.    [provider]  docusate (COLACE) 50 MG/5ML liquid Place 10 mLs (100 mg total) into feeding tube every 12 (twelve) hours. 07/12/19   Allie Bossier, MD  famotidine (PEPCID) 20 MG tablet Place 1 tablet (20 mg total) into feeding tube at bedtime. Patient taking differently: Place 20 mg into feeding tube 2 (two) times daily. 07/12/19   Allie Bossier, MD  fentaNYL (DURAGESIC) 25 MCG/HR Place 1 patch onto the skin every 3 (three) days. 07/14/19   Allie Bossier, MD  gabapentin (NEURONTIN) 250 MG/5ML solution Place 6 mLs (300 mg total) into feeding tube 2 (two) times daily. 07/12/19   Allie Bossier, MD  gabapentin (NEURONTIN) 300 MG capsule Place 300 mg into feeding tube every 12 (twelve) hours.    [provider]  HYDROcodone-acetaminophen (NORCO/VICODIN) 5-325 MG tablet Place 1 tablet into feeding tube every 6 (six) hours as needed for moderate pain.    [provider]  insulin glargine (LANTUS) 100 UNIT/ML injection Inject 30 Units into the skin daily.    [provider]  insulin regular (NOVOLIN R) 100 units/mL injection Inject 2-10 Units into the skin See admin instructions. Inject 2-10 units into the skin every 6 hours, per sliding scale: BGL 151-200 = 2 units, 201-250= 4 units, 251-300= 6 units, 301-350= 8 units, 351-400 = 10 units and call MD if <60 or >400    [provider]  ipratropium-albuterol (DUONEB) 0.5-2.5 (3) MG/3ML SOLN Take 3 mLs by nebulization every 4 (four) hours as needed (for shortness of breath).    [provider]  lansoprazole (PREVACID) 30 MG capsule Place 30 mg into feeding tube daily at 12 noon. Patient not taking: Reported on 07/12/2020    [provider]  lisinopril (ZESTRIL) 5 MG tablet Take 1 tablet (5 mg total) by mouth daily. Patient not taking: Reported on 12/19/2019 07/13/19   Allie Bossier, MD  LORazepam (ATIVAN) 2 MG/ML injection Inject 0.25 mLs (0.5 mg total) into the vein every 4 (four) hours as needed for anxiety. Replace  with clonazepam once enteral absorption confirmed. Patient not taking: Reported on 12/19/2019 10/11/19   Candee Furbish, MD  Multiple Vitamin (MULTIVITAMIN WITH MINERALS) TABS tablet Take 1 tablet by mouth daily. Patient taking differently: Place 1 tablet into feeding tube daily. '18mg'$  iron-471mg-25mcg 07/13/19   WAllie Bossier MD  Nutritional Supplements (FEEDING SUPPLEMENT, OSMOLITE 1.5 CAL,) LIQD Place 1,000 mLs into feeding tube continuous. Patient taking differently: Place 1,000 mLs into feeding tube continuous. Infuse at 50 ml/hr. 07/12/19   WAllie Bossier MD  Olopatadine HCl 0.2 % SOLN Place 1 drop into both eyes daily. Patient not taking: Reported on 07/12/2020    [provider]  ondansetron (ZOFRAN) 4 MG/2ML SOLN injection Inject 2 mLs (4 mg total) into the vein every 6 (six) hours as  needed for nausea. 10/11/19   Candee Furbish, MD  pantoprazole (PROTONIX) 40 MG injection Inject 40 mg into the vein at bedtime. Patient not taking: Reported on 12/19/2019 10/11/19   Candee Furbish, MD  polyethylene glycol (MIRALAX / GLYCOLAX) 17 g packet Take 17 g by mouth daily. Patient taking differently: Place 17 g into feeding tube daily.  07/18/19   Candee Furbish, MD  scopolamine (TRANSDERM-SCOP) 1 MG/3DAYS Place 1 patch onto the skin every 3 (three) days.    [provider]  sertraline (ZOLOFT) 100 MG tablet Place 1 tablet (100 mg total) into feeding tube at bedtime. 07/12/19   Allie Bossier, MD  Sodium Chloride Flush (NORMAL SALINE FLUSH IV) Inject 10 mLs into the vein See admin instructions. 10 ml's as needed before and after medications    [provider]    Social History   Socioeconomic History   Marital status: Married    Spouse name: Not on file   Number of children: Not on file   Years of education: Not on file   Highest education level: Not on file  Occupational History   Not on file  Tobacco Use   Smoking status: Never   Smokeless tobacco: Never   Substance and Sexual Activity   Alcohol use: Not on file   Drug use: Not on file   Sexual activity: Not on file  Other Topics Concern   Not on file  Social History Narrative   Not on file   Social Determinants of Health   Financial Resource Strain: Not on file  Food Insecurity: Not on file  Transportation Needs: Not on file  Physical Activity: Not on file  Stress: Not on file  Social Connections: Not on file  Intimate Partner Violence: Not on file    History reviewed. No pertinent family history.  ROS: '[x]'$  Positive   '[ ]'$  Negative   '[ ]'$  All sytems reviewed and are negative  Cardiac: '[]'$  chest pain/pressure '[]'$  palpitations '[]'$  SOB lying flat '[]'$  DOE  Vascular: '[]'$  pain in legs while walking '[]'$  pain in legs at rest '[]'$  pain in legs at night '[]'$  non-healing ulcers '[]'$  hx of DVT '[]'$  swelling in legs  Pulmonary: '[]'$  productive cough '[]'$  asthma/wheezing '[]'$  home O2  Neurologic: '[]'$  weakness in '[]'$  arms '[]'$  legs '[]'$  numbness in '[]'$  arms '[]'$  legs '[]'$  hx of CVA '[]'$  mini stroke '[]'$ difficulty speaking or slurred speech '[]'$  temporary loss of vision in one eye '[]'$  dizziness  Hematologic: '[]'$  hx of cancer '[]'$  bleeding problems '[]'$  problems with blood clotting easily  Endocrine:   '[]'$  diabetes '[]'$  thyroid disease  GI '[]'$  vomiting blood '[]'$  blood in stool  GU: '[]'$  CKD/renal failure '[]'$  HD--'[]'$  M/W/F or '[]'$  T/T/S '[]'$  burning with urination '[]'$  blood in urine  Psychiatric: '[]'$  anxiety '[]'$  depression  Musculoskeletal: '[]'$  arthritis '[]'$  joint pain  Integumentary: '[]'$  rashes '[]'$  ulcers  Constitutional: '[]'$  fever '[]'$  chills   Physical Examination  Vitals:   09/05/20 0734 09/05/20 0812  BP:    Pulse: 81   Resp: 20   Temp:  98.2 F (36.8 C)  SpO2: 99%    Body mass index is 27.19 kg/m.  General:  WDWN in NAD Gait: Not observed HENT: trach, normocephalic Pulmonary: normal non-labored breathing, Cardiac: regular Skin: without rashes Vascular Exam/Pulses: 2+ bilateral radial and  brachial pulses Extremities: without ischemic changes, without Gangrene , without cellulitis; without open wounds;  Musculoskeletal: with muscle wasting/atrophy. Upper extremity flaccidity.  Neurologic:  alert Psychiatric:  The pt has Abnormal- flat  affect.  CBC    Component Value Date/Time   WBC 22.1 (H) 09/05/2020 0349   RBC 3.28 (L) 09/05/2020 0349   HGB 8.6 (L) 09/05/2020 0349   HCT 27.0 (L) 09/05/2020 0349   PLT 445 (H) 09/05/2020 0349   MCV 82.3 09/05/2020 0349   MCH 26.2 09/05/2020 0349   MCHC 31.9 09/05/2020 0349   RDW 19.6 (H) 09/05/2020 0349   LYMPHSABS 2.3 09/04/2020 0437   MONOABS 1.7 (H) 09/04/2020 0437   EOSABS 0.2 09/04/2020 0437   BASOSABS 0.1 09/04/2020 0437    BMET    Component Value Date/Time   NA 133 (L) 09/05/2020 0349   K 4.4 09/05/2020 0349   CL 96 (L) 09/05/2020 0349   CO2 24 09/05/2020 0349   GLUCOSE 145 (H) 09/05/2020 0349   BUN 68 (H) 09/05/2020 0349   CREATININE 1.51 (H) 09/05/2020 0349   CALCIUM 8.8 (L) 09/05/2020 0349   GFRNONAA 49 (L) 09/05/2020 0349   GFRAA NOT CALCULATED 10/11/2019 0226    COAGS: Lab Results  Component Value Date   INR 1.9 (H) 07/25/2020   INR 1.5 (H) 07/24/2020   INR 1.5 (H) 07/23/2020     Non-Invasive Vascular Imaging:   none   ASSESSMENT/PLAN: This is a 70 y.o. male prolonged dialysis dependent AKI, now ESRD: renal replacement therapy started 7/6; Initially on CRRT and now on intermittent hemodialysis.  Needs permanent HD access; pending transfer to LTC facility once completed. Likely UE AVG. Will obtain BUE vein mapping.  -Dr. Trula Slade, on-call vascular surgeon, will provide further plans/recs.  Risa Grill, PA-C [facility] [phone]   I agree with the above.  I have seen and evaluated the patient.  No family is available.  The patient has a palpable left radial pulse.  Vein mapping does not show adequate surface veins.  He will need a left upper arm dialysis graft.  He is  currently on Eliquis which will need to be transition to IV heparin.  We can consider placement of his access next week once his Eliquis has been transitioned.  Annamarie Major

## 2020-09-05 NOTE — TOC Progression Note (Signed)
Transition of Care [facility]) - Progression Note    Patient Details  Name: Luis Orr MRN: [id number] Date of Birth: [birth year]  Transition of Care [facility]) CM/SW [place], RN Phone Number: 09/05/2020, 8:41 AM  Clinical Narrative:    Case management called and spoke with [facility] in [place], [place] and the facility was unable to accommodate for HD needs.  I called [facility] in [place], [place] [phone] and the facility has an available LTC SNF/Vent/HD bed for the patient at the facility.  Amber, CM at [facility] is the admissions liaison to the facility with contact # [phone].    I called and spoke with the patient's wife, Luis Orr on the phone and she accepted the offer for admission to the facility.  [facility] will assist the patient's wife at this time to complete needed documents to obtain [place] Medicaid.  I called the wife and obtained her email address - [email or web address] and pre-admission checklist was sent to her email to she can obtain needed financial records to give to the facility to complete the admission paperwork.  The patient will be admitted, most likely into the long term care unit.  [facility] was given the contact number to [facility] to speak with Burgess Estelle to obtain information in regards to possible Medicare days as well to assist with admission to the facility.  The patient will need to be medically stable for transport to the facility once insurance authorization is acquired by the facility.  The patient has tunnel dialysis catheter at this time, but will need permanent access before admitting to the facility per [facility].  CM and MSW will continue to follow the patient for transitions of care needs and admission to [facility] to receive LTC SNF placement at the facility.   Expected Discharge Plan: [place] Barriers to Discharge: Continued Medical  Work up, No SNF bed, Vent Bed not available  Expected Discharge Plan and Services Expected Discharge Plan: [place]   Discharge Planning Services: CM Consult Post Acute Care Choice: [facility] Living arrangements for the past 2 months: Post-Acute Facility                                       Social Determinants of Health (SDOH) Interventions    Readmission Risk Interventions No flowsheet data found.

## 2020-09-05 NOTE — Progress Notes (Signed)
[facility] NEPHROLOGY PROGRESS NOTE  Assessment/ Plan:  # Prolonged dialysis dependent AKI, now ESRD:  renal replacement therapy started 7/6; Initially on CRRT and now on intermittent hemodialysis.   On MWF scheudle, 3.5h, 2K, 2-3L UF, HD cath, no heparin Using [facility] but AVF/G req by SNF, will consult [facility]  # Septic shock/ventilator associated pneumonia due to recurrent Acinetobacter infection: Hemodynamically stable off pressors.  Off ABX, per TRH/CCM  #  Chronic respiratory failure: Status post tracheostomy with ventilator management per CCM.  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  # CKD-BMD: Ca and P stable, off binders. CTM  # Anemia: contiue ESA, monitor Hb.   # Acute metabolic encephalopathy: Continue management as above.,  Supportive care.  #Mild hypokalemia:Resikved  Subjective:  For HD today Accepted to SNF in [place], but needs AVF or AVG Seen and examined in ICU.   No UOP No interval events  Objective Vital signs in last 24 hours: Vitals:   09/05/20 0500 09/05/20 0600 09/05/20 0734 09/05/20 0812  BP: 98/70 (!) 117/91    Pulse: 81 82 81   Resp: 20 20 20    Temp:    98.2 F (36.8 C)  TempSrc:    Axillary  SpO2: 98% 98% 99%   Weight:      Height:       Weight change:   Intake/Output Summary (Last 24 hours) at 09/05/2020 1002 Last data filed at 09/05/2020 0600 Gross per 24 hour  Intake 585.75 ml  Output 175 ml  Net 410.75 ml        Labs: Basic Metabolic Panel: Recent Labs  Lab 09/02/20 0347 09/04/20 0437 09/05/20 0349  NA 133* 134* 133*  K 3.3* 4.4 4.4  CL 96* 97* 96*  CO2 24 26 24   GLUCOSE 190* 104* 145*  BUN 70* 47* 68*  CREATININE 1.48* 1.15 1.51*  CALCIUM 8.8* 8.7* 8.8*  PHOS  --   --  5.4*    Liver Function Tests: Recent Labs  Lab 08/31/20 0419 09/04/20 0437 09/05/20 0349  AST 33 30 26   ALT 27 21 20   ALKPHOS 261* 223* 204*  BILITOT 1.0 0.5 0.5  PROT 6.7 7.2 6.9  ALBUMIN 1.6* 1.7* 1.7*    No results for input(s): LIPASE, AMYLASE in the last 168 hours. No results for input(s): AMMONIA in the last 168 hours. CBC: Recent Labs  Lab 08/30/20 0907 08/31/20 0419 09/01/20 0428 09/02/20 0347 09/03/20 1855 09/04/20 0437 09/05/20 0349  WBC 33.9* 29.6* 27.8* 24.5*  --  23.5* 22.1*  NEUTROABS 27.8* 23.0*  --   --   --  17.9*  --   HGB 9.4* 8.7* 8.7* 8.7* 9.4* 8.8* 8.6*  HCT 29.3* 27.7* 26.9* 26.9* 29.1* 28.2* 27.0*  MCV 83.0 82.9 82.3 82.0  --  82.5 82.3  PLT 487* 509* 478* 463*  --  479* 445*    Cardiac Enzymes: No results for input(s): CKTOTAL, CKMB, CKMBINDEX, TROPONINI in the last 168 hours. CBG: Recent Labs  Lab 09/04/20 1541 09/04/20 1942 09/04/20 2334 09/05/20 0347 09/05/20 0811  GLUCAP 99 103* 122* 132* 110*     Iron Studies: No results for input(s): IRON, TIBC, TRANSFERRIN, FERRITIN in the last 72 hours. Studies/Results: No results found.  Medications: Infusions:  sodium chloride Stopped (07/26/20 0824)   sodium chloride 500 mL (09/01/20 1408)   sodium chloride     feeding  supplement (NEPRO CARB STEADY) 1,000 mL (09/04/20 1259)   methocarbamol (ROBAXIN) IV      Scheduled Medications:  amiodarone  200 mg Per Tube Daily   apixaban  5 mg Oral BID   buPROPion  75 mg Per Tube BID   chlorhexidine gluconate (MEDLINE KIT)  15 mL Mouth Rinse BID   Chlorhexidine Gluconate Cloth  6 each Topical Q0600   Chlorhexidine Gluconate Cloth  6 each Topical Q0600   darbepoetin (ARANESP) injection - DIALYSIS  40 mcg Intravenous Q Mon-HD   feeding supplement (PROSource TF)  45 mL Per Tube TID   guaiFENesin  5 mL Per Tube BID   insulin aspart  0-9 Units Subcutaneous Q4H   insulin glargine-yfgn  25 Units Subcutaneous Daily   lidocaine  1 patch Transdermal Q24H   lipase/protease/amylase)  20,880 Units Per Tube Once   And   sodium bicarbonate  650 mg Per Tube  Once   mouth rinse  15 mL Mouth Rinse 10 times per day   metaxalone  800 mg Per Tube TID   midodrine  5 mg Per Tube Q8H   multivitamin  1 tablet Per Tube QHS   pantoprazole sodium  40 mg Per Tube QHS   scopolamine  1 patch Transdermal Q72H   sertraline  100 mg Per Tube Daily   sodium chloride flush  10-40 mL Intracatheter Q12H    have reviewed scheduled and prn medications.  Physical Exam: General:NAD, comfortable on tracheostomy/on vent.  Eyes closed this AM Heart:RRR, s1s2 nl Lungs: Coarse breath sounds bilaterally, no wheezing Abdomen:soft, Non-tender, PEG tube and ostomy bag Extremities:Trace edema Dialysis Access: Right IJ TDC  Iviona Hole B Graig Hessling 09/05/2020,10:02 AM  LOS: 56 days

## 2020-09-05 NOTE — Progress Notes (Signed)
This RN attempted to give meds through unclogged PEG tube. Medication and flush met no resistance but regurgitated around PEG tube insertion site. RD, Brynda Greathouse PA, Erin Hearing NP, Sloan Leiter [facility] consulted.

## 2020-09-06 DIAGNOSIS — J9611 Chronic respiratory failure with hypoxia: Secondary | ICD-10-CM

## 2020-09-06 DIAGNOSIS — N186 End stage renal disease: Secondary | ICD-10-CM

## 2020-09-06 DIAGNOSIS — J9621 Acute and chronic respiratory failure with hypoxia: Secondary | ICD-10-CM | POA: Diagnosis not present

## 2020-09-06 DIAGNOSIS — Z992 Dependence on renal dialysis: Secondary | ICD-10-CM

## 2020-09-06 DIAGNOSIS — A4159 Other Gram-negative sepsis: Secondary | ICD-10-CM | POA: Diagnosis not present

## 2020-09-06 DIAGNOSIS — J158 Pneumonia due to other specified bacteria: Secondary | ICD-10-CM | POA: Diagnosis not present

## 2020-09-06 DIAGNOSIS — A419 Sepsis, unspecified organism: Secondary | ICD-10-CM | POA: Diagnosis not present

## 2020-09-06 DIAGNOSIS — G8250 Quadriplegia, unspecified: Secondary | ICD-10-CM | POA: Diagnosis not present

## 2020-09-06 LAB — CBC
HCT: 31.7 % — ABNORMAL LOW (ref 39.0–52.0)
Hemoglobin: 9.9 g/dL — ABNORMAL LOW (ref 13.0–17.0)
MCH: 25.8 pg — ABNORMAL LOW (ref 26.0–34.0)
MCHC: 31.2 g/dL (ref 30.0–36.0)
MCV: 82.6 fL (ref 80.0–100.0)
Platelets: 458 10*3/uL — ABNORMAL HIGH (ref 150–400)
RBC: 3.84 MIL/uL — ABNORMAL LOW (ref 4.22–5.81)
RDW: 19.9 % — ABNORMAL HIGH (ref 11.5–15.5)
WBC: 39 10*3/uL — ABNORMAL HIGH (ref 4.0–10.5)
nRBC: 0.4 % — ABNORMAL HIGH (ref 0.0–0.2)

## 2020-09-06 LAB — PROCALCITONIN: Procalcitonin: 5.03 ng/mL

## 2020-09-06 LAB — APTT: aPTT: 53 seconds — ABNORMAL HIGH (ref 24–36)

## 2020-09-06 LAB — GLUCOSE, CAPILLARY
Glucose-Capillary: 220 mg/dL — ABNORMAL HIGH (ref 70–99)
Glucose-Capillary: 253 mg/dL — ABNORMAL HIGH (ref 70–99)
Glucose-Capillary: 212 mg/dL — ABNORMAL HIGH (ref 70–99)
Glucose-Capillary: 200 mg/dL — ABNORMAL HIGH (ref 70–99)

## 2020-09-06 LAB — HEPARIN LEVEL (UNFRACTIONATED): Heparin Unfractionated: >1.1 IU/mL — ABNORMAL HIGH (ref 0.30–0.70)

## 2020-09-06 MED ORDER — HEPARIN (PORCINE) 25000 UT/250ML-% IV SOLN
1800.0000 [IU]/h | INTRAVENOUS | Status: DC
  Administered 2020-09-06: 1150 [IU]/h via INTRAVENOUS
  Administered 2020-09-07: 1200 [IU]/h via INTRAVENOUS
  Administered 2020-09-08: 1600 [IU]/h via INTRAVENOUS
  Administered 2020-09-09: 1650 [IU]/h via INTRAVENOUS
  Administered 2020-09-10: 1700 [IU]/h via INTRAVENOUS
  Filled 2020-09-06 (×6): qty 250

## 2020-09-06 NOTE — Progress Notes (Signed)
ANTICOAGULATION CONSULT NOTE   Pharmacy Consult for Heparin Indication: atrial fibrillation while apixaban on hold  Allergies  Allergen Reactions   Adhesive [Tape] Rash    Patient Measurements: Height: '5\' 6"'$  (167.6 cm) Weight: 74.6 kg (164 lb 7.4 oz) IBW/kg (Calculated) : 63.8 Heparin Dosing Weight: 74.6 kg   Vital Signs: Temp: 98.5 F (36.9 C) (08/25 1930) Temp Source: Oral (08/25 1930) BP: 148/86 (08/25 2006) Pulse Rate: 91 (08/25 2006)  Labs: Recent Labs    09/04/20 0437 09/05/20 0349 09/06/20 0945 09/06/20 1830  HGB 8.8* 8.6* 9.9*  --   HCT 28.2* 27.0* 31.7*  --   PLT 479* 445* 458*  --   APTT  --   --   --  53*  HEPARINUNFRC  --   --   --  >1.10*  CREATININE 1.15 1.51*  --   --      Estimated Creatinine Clearance: 41.1 mL/min (A) (by C-G formula based on SCr of 1.51 mg/dL (H)).   Medical History: Past Medical History:  Diagnosis Date   Anemia    Anxiety    Chronic respiratory failure (HCC)    DM2 (diabetes mellitus, type 2) (HCC)    Functional quadriplegia (HCC)    Hypertension    Multiple drug resistant organism (MDRO) culture positive    Urine retention    UTI (urinary tract infection)     Assessment: 70 yo male admitted 07/03/2020 on apixaban for Afib. Pharmacy consulted to dose heparin with oral and peri tracheal bleeding. Minimal bleeding in mouth and around trach site - per RN possibly from patient biting lip. No other bleeding noted. Last dose apixaban 8/24 at 1430 (doses on 8/24 PM held per MD). No CBC today - hgb 8.6 and plt elevated on 8/24.   Aptt this evening a little below goal at 53.  MAR says heparin "restarted" at 1708 PM, but flowsheet charting shows heparin continuously running as ordered.  No overt bleeding or complications noted.  Goal of Therapy:  Heparin level 0.3-0.7 units/ml aPTT 66-102 seconds Monitor platelets by anticoagulation protocol: Yes   Plan:  Increase IV heparin to 1200 units/hr. Repeat aPTT with AM  labs. Daily aPTT/heparin level.  [place] Crane, Roylene Reason, BCCP Clinical Pharmacist  09/06/2020 8:56 PM   [facility] pharmacy phone numbers are listed on [email or web address]

## 2020-09-06 NOTE — Progress Notes (Signed)
[facility] NEPHROLOGY PROGRESS NOTE  Assessment/ Plan:  # Prolonged dialysis dependent AKI, now ESRD:  renal replacement therapy started 7/6; Initially on CRRT and now on intermittent hemodialysis.   On MWF scheudle, 3.5h, 2K, 2-3L UF, HD cath, no heparin Given high inpatient census and patient's clinical stability, next treatment will be either 8/26 or 8/27 based upon labs and status Using [facility] but AVF/G req by SNF, AVG planned for next week  # Septic shock/ventilator associated pneumonia due to recurrent Acinetobacter infection: Hemodynamically stable off pressors.  Off ABX, per TRH/CCM  #  Chronic respiratory failure: Status post tracheostomy with ventilator management per CCM.  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  # CKD-BMD: Ca and P stable, off binders. CTM  # Anemia: contiue ESA, monitor Hb.   # Acute metabolic encephalopathy: Continue management as above.,  Supportive care.  #Mild hypokalemia:Resikved  Subjective:  VVS planning AVG next week Continues to have some bleeding from oropharynx Completed dialysis yesterday with 1.7 L ultrafiltration   Objective Vital signs in last 24 hours: Vitals:   09/06/20 0800 09/06/20 0819 09/06/20 0900 09/06/20 1000  BP: (!) 160/92  (!) 155/92   Pulse: (!) 102 (!) 103 99 93  Resp: 20 20    Temp:      TempSrc:      SpO2: 96% 95%    Weight:   74.6 kg   Height:   5' 6"  (1.676 m)    Weight change:   Intake/Output Summary (Last 24 hours) at 09/06/2020 1103 Last data filed at 09/06/2020 0600 Gross per 24 hour  Intake 540 ml  Output 1650 ml  Net -1110 ml        Labs: Basic Metabolic Panel: Recent Labs  Lab 09/02/20 0347 09/04/20 0437 09/05/20 0349  NA 133* 134* 133*  K 3.3* 4.4 4.4  CL 96* 97* 96*  CO2 24 26 24   GLUCOSE 190* 104* 145*  BUN 70* 47* 68*  CREATININE  1.48* 1.15 1.51*  CALCIUM 8.8* 8.7* 8.8*  PHOS  --   --  5.4*    Liver Function Tests: Recent Labs  Lab 08/31/20 0419 09/04/20 0437 09/05/20 0349  AST 33 30 26  ALT 27 21 20   ALKPHOS 261* 223* 204*  BILITOT 1.0 0.5 0.5  PROT 6.7 7.2 6.9  ALBUMIN 1.6* 1.7* 1.7*    No results for input(s): LIPASE, AMYLASE in the last 168 hours. No results for input(s): AMMONIA in the last 168 hours. CBC: Recent Labs  Lab 08/31/20 0419 09/01/20 0428 09/02/20 0347 09/03/20 1855 09/04/20 0437 09/05/20 0349 09/06/20 0945  WBC 29.6* 27.8* 24.5*  --  23.5* 22.1* 39.0*  NEUTROABS 23.0*  --   --   --  17.9*  --   --   HGB 8.7* 8.7* 8.7*   < > 8.8* 8.6* 9.9*  HCT 27.7* 26.9* 26.9*   < > 28.2* 27.0* 31.7*  MCV 82.9 82.3 82.0  --  82.5 82.3 82.6  PLT 509* 478* 463*  --  479* 445* 458*   < > = values in this interval not displayed.    Cardiac Enzymes: No results for input(s): CKTOTAL, CKMB, CKMBINDEX, TROPONINI in the last 168 hours. CBG: Recent Labs  Lab 09/05/20 1616 09/05/20 2013 09/05/20 2356 09/06/20 0402 09/06/20 0744  GLUCAP 115* 113* 175* 220* 253*     Iron Studies: No results  for input(s): IRON, TIBC, TRANSFERRIN, FERRITIN in the last 72 hours. Studies/Results: DG Abd Portable 1V  Result Date: 09/05/2020 CLINICAL DATA:  Feeding tube placement EXAM: PORTABLE ABDOMEN - 1 VIEW COMPARISON:  09/01/2020 FINDINGS: Feeding tube is transpyloric with the tip in the distal duodenum. Nonobstructive bowel gas pattern. IMPRESSION: Feeding tube tip in the distal duodenum. Electronically Signed   By: Rolm Baptise M.D.   On: 09/05/2020 17:30   VAS [place] UPPER EXT VEIN MAPPING (PRE-OP AVF)  Result Date: 09/05/2020 UPPER EXTREMITY VEIN MAPPING Patient Name:  Luis Orr  Date of Exam:   09/05/2020 Medical Rec #: [id number]          Accession #:    [id number] Date of Birth: Dec 28, 1950         Patient Gender: M Patient Age:   70 years Exam Location:  [facility] Procedure:      VAS [place]  UPPER EXT VEIN MAPPING (PRE-OP AVF) Referring Phys: Karoline Caldwell --------------------------------------------------------------------------------  Indications: Pre-access. Comparison Study: No previous Performing Technologist: Jody Hill RVT, RDMS  Examination Guidelines: A complete evaluation includes B-mode imaging, spectral Doppler, color Doppler, and power Doppler as needed of all accessible portions of each vessel. Bilateral testing is considered an integral part of a complete examination. Limited examinations for reoccurring indications may be performed as noted. +-----------------+-------------+----------+--------------+ Right Cephalic   Diameter (cm)Depth (cm)   Findings    +-----------------+-------------+----------+--------------+ Shoulder             0.12        0.72                  +-----------------+-------------+----------+--------------+ Prox upper arm       0.18        0.57                  +-----------------+-------------+----------+--------------+ Mid upper arm        0.14        0.92                  +-----------------+-------------+----------+--------------+ Dist upper arm       0.11        0.89                  +-----------------+-------------+----------+--------------+ Antecubital fossa    0.17        0.52      Thrombus    +-----------------+-------------+----------+--------------+ Prox forearm         0.10        0.74                  +-----------------+-------------+----------+--------------+ Mid forearm          0.11        0.80     branching    +-----------------+-------------+----------+--------------+ Dist forearm         0.11        0.66                  +-----------------+-------------+----------+--------------+ Wrist                                   not visualized +-----------------+-------------+----------+--------------+ +-----------------+-------------+----------+--------------+ Right Basilic    Diameter (cm)Depth (cm)    Findings    +-----------------+-------------+----------+--------------+ Shoulder  not visualized +-----------------+-------------+----------+--------------+ Prox upper arm                          not visualized +-----------------+-------------+----------+--------------+ Mid upper arm                           not visualized +-----------------+-------------+----------+--------------+ Dist upper arm                          not visualized +-----------------+-------------+----------+--------------+ Antecubital fossa                       not visualized +-----------------+-------------+----------+--------------+ Prox forearm                            not visualized +-----------------+-------------+----------+--------------+ Mid forearm                             not visualized +-----------------+-------------+----------+--------------+ Distal forearm                          not visualized +-----------------+-------------+----------+--------------+ Wrist                                   not visualized +-----------------+-------------+----------+--------------+ +-----------------+-------------+----------+--------------+ Left Cephalic    Diameter (cm)Depth (cm)   Findings    +-----------------+-------------+----------+--------------+ Shoulder             0.14        0.62                  +-----------------+-------------+----------+--------------+ Prox upper arm       0.19        0.65                  +-----------------+-------------+----------+--------------+ Mid upper arm        0.11        0.76   not visualized +-----------------+-------------+----------+--------------+ Dist upper arm       0.12        0.46                  +-----------------+-------------+----------+--------------+ Antecubital fossa    0.15        0.30   not visualized +-----------------+-------------+----------+--------------+ Prox forearm          0.14        0.34                  +-----------------+-------------+----------+--------------+ Mid forearm          0.12        0.50                  +-----------------+-------------+----------+--------------+ Dist forearm                            not visualized +-----------------+-------------+----------+--------------+ Wrist                                   not visualized +-----------------+-------------+----------+--------------+ +-----------------+-------------+----------+--------------+ Left Basilic     Diameter (cm)Depth (cm)   Findings    +-----------------+-------------+----------+--------------+ Prox upper arm  0.30                              +-----------------+-------------+----------+--------------+ Mid upper arm        0.26                              +-----------------+-------------+----------+--------------+ Dist upper arm       0.19                              +-----------------+-------------+----------+--------------+ Antecubital fossa    0.20                              +-----------------+-------------+----------+--------------+ Prox forearm         0.14                              +-----------------+-------------+----------+--------------+ Mid forearm          0.08                              +-----------------+-------------+----------+--------------+ Distal forearm                          not visualized +-----------------+-------------+----------+--------------+ Wrist                                   not visualized +-----------------+-------------+----------+--------------+ *See table(s) above for measurements and observations.  Diagnosing physician: Harold Barban MD Electronically signed by Harold Barban MD on 09/05/2020 at 8:13:16 PM.    Final     Medications: Infusions:  sodium chloride Stopped (07/26/20 0824)   sodium chloride 500 mL (09/01/20 1408)   sodium chloride     feeding supplement (NEPRO CARB  STEADY) 1,000 mL (09/05/20 1818)   heparin 1,150 Units/hr (09/06/20 1003)    Scheduled Medications:  amiodarone  200 mg Per Tube Daily   buPROPion  75 mg Per Tube BID   chlorhexidine gluconate (MEDLINE KIT)  15 mL Mouth Rinse BID   Chlorhexidine Gluconate Cloth  6 each Topical Q0600   Chlorhexidine Gluconate Cloth  6 each Topical Q0600   darbepoetin (ARANESP) injection - DIALYSIS  40 mcg Intravenous Q Mon-HD   feeding supplement (PROSource TF)  45 mL Per Tube TID   guaiFENesin  5 mL Per Tube BID   insulin aspart  0-9 Units Subcutaneous Q4H   insulin glargine-yfgn  25 Units Subcutaneous Daily   lidocaine  1 patch Transdermal Q24H   mouth rinse  15 mL Mouth Rinse 10 times per day   metaxalone  800 mg Per Tube TID   midodrine  5 mg Per Tube Q8H   multivitamin  1 tablet Per Tube QHS   pantoprazole sodium  40 mg Per Tube QHS   scopolamine  1 patch Transdermal Q72H   sertraline  100 mg Per Tube Daily   sodium bicarbonate  650 mg Per Tube Once   sodium chloride flush  10-40 mL Intracatheter Q12H    have reviewed scheduled and prn medications.  Physical Exam: General:NAD, comfortable on tracheostomy/on vent.  Heart:RRR, s1s2 nl Lungs: Coarse breath sounds bilaterally, no  wheezing Abdomen:soft, Non-tender, PEG tube and ostomy bag Extremities:Trace edema Dialysis Access: Right IJ TDC  Huntleigh Doolen B Avalie Oconnor 09/06/2020,11:03 AM  LOS: 57 days

## 2020-09-06 NOTE — Progress Notes (Addendum)
NAME:  CARMICHAEL HOPPS, MRN:  [id number], DOB:  [birth year], LOS: 49 ADMISSION DATE:  06/26/2020, CONSULTATION DATE:  07/07/2020 REFERRING MD:  Dr Francia Greaves, EDP CHIEF COMPLAINT:  septic shock    History of Present Illness:  70 yo male from [facility] with hx of C3-C5 compressive myelopathy with functional quadriplegia and chronic trach/vent presented to ED with altered mental status and hypotension with recurrent HCAP.  Has multiple recent admissions for sepsis secondary to various infections including UTIs, pneumonias and bacteremia in setting of MDR Klebsiella, MRSA and pseudomonas. Most recent admission September 2021 in setting of sepsis due to MDR Klebsiella with right ureteral calculus s/p double J ureteral stent placement and treated with meropenem x 10days.  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 Central line placed. Drainage from gastrostomy tube >> CT Abd without fluid collection around gastrostomy tube but with diffuse body wall edema. Echo EF 50-55%, mild LVH  7/6 Renal Replacement Therapy ; Code status changed to partial Code (DNR) 7/7 Transfuse 1U PRBC 7/9 add solu cortef 7/10 off pressors, weaning stress steroids  7/11 Pressors added back on again 7/12 ID consult due to rising WBC, hypothermia, hypotension concern for worsening infection. Vancomycin added. Repeat blood cx collected. 7/15 CRRT stopped   7/17 Restarted on low dose levo. No evidence of new infection. Held off on antibiotics. Nephrology contacted family and decision was made to continue iHD this week to allow additional time for renal recovery. Patient is not a long term iHD candidate  7/18 plans for iHD, tolerated with 2L removed  7/19 slight drop in hgb to 6.9 will transfuse 1 unit PRBC  7/20 To [facility], PCCM following for trach / vent needs 7/22>> tolerating HD, will need out of state placement 8/4 last PCCM visit. 8/4 -8/8: No issues. CM workin gon out of state placement  8/15 no acute changes. Continues on PRVC, was apneic with PSV attempt. Did wean to 40% FiO2   Interim History / Subjective:  Remains on full support - 40% / PEEP 5  Has been having some bleeding from trach site, which is felt to be secondary to oral trauma from biting lip/tongue.   Objective   Blood pressure (!) 160/87, pulse (!) 103, temperature 97.7 F (36.5 C), temperature source Oral, resp. rate 20, height '5\' 6"'$  (1.676 m), weight 74.6 kg, SpO2 95 %.    Vent Mode: PRVC FiO2 (%):  [40 %] 40 % Set Rate:  [20 bmp] 20 bmp Vt Set:  [510 mL] 510 mL PEEP:  [5 cmH20] 5 cmH20 Plateau Pressure:  [29 cmH20-36 cmH20] 32 cmH20   Intake/Output Summary (Last 24 hours) at 09/06/2020 0940 Last data filed at 09/06/2020 0600 Gross per 24 hour  Intake 540 ml  Output 1650 ml  Net -1110 ml    Filed Weights   09/05/20 1900 09/05/20 2300 09/06/20 0900  Weight: 76.5 kg 74.6 kg 74.6 kg    Physical Exam: General:  overweight male in NAD Neuro:  Awake, alert HEENT:  [place]/AT, No JVD noted, PERRL Cardiovascular:  RRR, no MRG Lungs:  Clear bilateral breath sounds.  Abdomen:  Soft, non-distended. PEG site CDI Musculoskeletal:  No acute deformity. Dependent edema.  Skin:  Intact, MMM   Resolved Hospital Problem list   Hyperkalemia Tachycardia Hypotension Thrombocytosis  AKI Septic shock 2nd to HCAP and UTI all present on admission -Acinetobacter in sputum  culture from 07/12/20. Completed 14 days of Unasyn for -Of pressors on 4/14. Restarted  levo 7/17. -acute on chronic respiratory failure   Assessment & Plan:   C3-C5 compressive Myelopathy w/ resultant Chronic Respiratory failure, vent and trach dependence  Acinetobacter Baumannii PNA Not weaning -PRVC 8cc/kg as support mode, not able to wean  -Trach care per protocol  -VAP prevention measures  -Antibiotics per [facility] -DNR in event of cardiac arrest  -Bleeding is only in oral suctioning and from trach site, none in in-line suction so I agree this is likely an issue cephalad to the trach. Caution with anticoagulation.    PCCM will see twice weekly   Georgann Housekeeper, AGACNP-BC [place] for personal pager PCCM on call pager [phone] until 7pm. Please call Elink 7p-7a. [phone]  09/06/2020 10:01 AM  Patient seen, independently examined, care plan was formulated and discussed with Georgann Housekeeper as per documentation  No significant change in status Awake, alert and responsive Clear bilateral breath sounds S1-S2 appreciated  Labs reviewed  C3-C5 compressive myelopathy with resultant chronic respiratory failure, ventilator and tracheostomy dependence Acinetobacter pneumonia  Following twice a week Unable to wean from ventilator Remains on dialysis  Placement issues  Sherrilyn Rist, MD [place] PCCM Pager: See Shea Evans

## 2020-09-06 NOTE — Progress Notes (Signed)
Nutrition Follow-up  DOCUMENTATION CODES:   Not applicable  INTERVENTION:   Continue tube feeds via Cortrak tube until G-tube is replaced: - Nepro @ 45 ml/hr (1080 ml/day) - ProSource TF 45 ml TID  Tube feeding regimen provides 2064 kcal, 120 grams of protein, and 785 ml free water daily.  - Continue renal MVI daily  NUTRITION DIAGNOSIS:   Increased nutrient needs related to wound healing as evidenced by estimated needs.  Ongoing, being addressed via TF  GOAL:   Patient will meet greater than or equal to 90% of their needs  Met via TF  MONITOR:   Vent status, Labs, Weight trends, TF tolerance, Skin, I & O's  REASON FOR ASSESSMENT:   Ventilator, Consult Enteral/tube feeding initiation and management  ASSESSMENT:   70 year old male who presented to the ED on 6/29 from [facility] with AMS and hypotension. 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 Pt admitted with septic shock, acute on chronic hypoxemic respiratory failure.  Tube feeding has been on hold since 8/22 due to ongoing trouble with G-tube clogging and leaking. Plans to replace G-tube when supplies are available (currently on back order). Cortrak placed yesterday for use until G-tube can be replaced. Tip of tube is in the distal duodenum. TF was resumed and patient is currently tolerating Nepro at 45 ml/h with Prosource TF 45 ml TID.   S/P vein mapping for permanent HD access. Will likely need an AVG, which can be placed next week. Currently receiving iHD on MWF schedule via [place].   Plans for D/C to SNF in [place] when medically stable.  Admit weight: 90.2 kg Current weight: 74.6 kg Lowest weight since admit: 72 kg on 8/03  Patient remains on ventilator support via trach MV: 10.2 L/min Temp (24hrs), Avg:97.7 F (36.5 C), Min:97.5 F  (36.4 C), Max:98 F (36.7 C)  Medications reviewed and include: aranesp weekly, SSI q 4 hours, semglee 25 units daily, rena-vit, protonix.  Labs reviewed: Na 133, phos 5.4 CBG: 220-253  Colostomy: 75 ml x 24 hours I/O's: +2.5 L since admit  Diet Order:   Diet Order             Diet NPO time specified  Diet effective midnight                   EDUCATION NEEDS:   No education needs have been identified at this time  Skin:  Skin Assessment: Skin Integrity Issues: DTI: L ear Stage IV: sacrum Stage III: R heel  Last BM:  8/24 colostomy  Height:   Ht Readings from Last 1 Encounters:  09/06/20 5' 6"  (1.676 m)    Weight:   Wt Readings from Last 1 Encounters:  09/06/20 74.6 kg    Ideal Body Weight:  58 kg (adjusted for quadriplegia)  BMI:  Body mass index is 26.55 kg/m.  Estimated Nutritional Needs:   Kcal:  1900-2100  Protein:  110-120 gm  Fluid:  1 L + UOP   Luis Orr, RD, LDN, CNSC Please refer to Amion for contact information.

## 2020-09-06 NOTE — TOC Progression Note (Addendum)
Transition of Care [facility]) - Progression Note    Patient Details  Name: Luis Orr MRN: [id number] Date of Birth: 10-25-1950  [facility]) CM/SW [place], RN Phone Number: 09/06/2020, 10:27 AM  Clinical Narrative:    CM left a provider letter and information packet regarding [facility] in [place], [place] for the wife to pick up from the [facility] secretary.  [place] MSW will be in contact the patient's wife to obtain appropriate admission documents and financial documents for admission to the facility.  The facility will be assisting the patient's wife in obtaining [place] Medicaid for admission.  CM noted that patient will most likely be scheduled for permanent HD access for AVG next week per vascular surgery note.  CM will continue to follow the patient for SNF placement at [facility] at a later date.   Expected Discharge Plan: [place] Barriers to Discharge: Continued Medical Work up, No SNF bed, Vent Bed not available  Expected Discharge Plan and Services Expected Discharge Plan: [place]   Discharge Planning Services: CM Consult Post Acute Care Choice: [place] Living arrangements for the past 2 months: Post-Acute Facility                                       Social Determinants of Health (SDOH) Interventions    Readmission Risk Interventions No flowsheet data found.

## 2020-09-06 NOTE — Progress Notes (Addendum)
Subjective  -   No acute overnight events   Physical Exam:  Palpable left radial pulse       Assessment/Plan:    End-stage renal disease: The patient will be scheduled for a left upper arm dialysis graft on Monday by Dr. Stanford Breed.  There is no family at the bedside to discuss this.  We will have to reach out to them to go over the risks and benefits.  He is now on IV heparin.  His Eliquis was stopped yesterday.  He will need to be n.p.o. after midnight Sunday night  Luis Orr 09/06/2020 10:33 AM --  Vitals:   09/06/20 0746 09/06/20 0819  BP:    Pulse:  (!) 103  Resp:  20  Temp: 97.7 F (36.5 C)   SpO2:  95%    Intake/Output Summary (Last 24 hours) at 09/06/2020 1033 Last data filed at 09/06/2020 0600 Gross per 24 hour  Intake 540 ml  Output 1650 ml  Net -1110 ml     Laboratory CBC    Component Value Date/Time   WBC 39.0 (H) 09/06/2020 0945   HGB 9.9 (L) 09/06/2020 0945   HCT 31.7 (L) 09/06/2020 0945   PLT 458 (H) 09/06/2020 0945    BMET    Component Value Date/Time   NA 133 (L) 09/05/2020 0349   K 4.4 09/05/2020 0349   CL 96 (L) 09/05/2020 0349   CO2 24 09/05/2020 0349   GLUCOSE 145 (H) 09/05/2020 0349   BUN 68 (H) 09/05/2020 0349   CREATININE 1.51 (H) 09/05/2020 0349   CALCIUM 8.8 (L) 09/05/2020 0349   GFRNONAA 49 (L) 09/05/2020 0349   GFRAA NOT CALCULATED 10/11/2019 0226    COAG Lab Results  Component Value Date   INR 1.9 (H) 07/25/2020   INR 1.5 (H) 07/24/2020   INR 1.5 (H) 07/23/2020   No results found for: PTT  Antibiotics Anti-infectives (From admission, onward)    Start     Dose/Rate Route Frequency Ordered Stop   08/29/20 2200  Ampicillin-Sulbactam (UNASYN) 3 g in sodium chloride 0.9 % 100 mL IVPB        3 g 200 mL/hr over 30 Minutes Intravenous Every 12 hours 08/29/20 1112 09/03/20 2141   08/29/20 1000  Ampicillin-Sulbactam (UNASYN) 3 g in sodium chloride 0.9 % 100 mL IVPB  Status:  Discontinued        3 g 200 mL/hr  over 30 Minutes Intravenous Every 24 hours 08/29/20 0814 08/29/20 1112   08/27/20 1400  Ampicillin-Sulbactam (UNASYN) 3 g in sodium chloride 0.9 % 100 mL IVPB  Status:  Discontinued        3 g 200 mL/hr over 30 Minutes Intravenous Every 8 hours 08/27/20 1305 08/27/20 1308   08/27/20 1400  Ampicillin-Sulbactam (UNASYN) 3 g in sodium chloride 0.9 % 100 mL IVPB  Status:  Discontinued        3 g 200 mL/hr over 30 Minutes Intravenous Every 24 hours 08/27/20 1308 08/29/20 0814   08/07/20 1012  ceFAZolin (ANCEF) IVPB 2g/100 mL premix        over 30 Minutes Intravenous Continuous PRN 08/07/20 1023 08/07/20 1012   08/07/20 0000  ceFAZolin (ANCEF) IVPB 2g/100 mL premix        2 g 200 mL/hr over 30 Minutes Intravenous To Radiology 08/05/20 1244 08/07/20 0013   08/06/20 0000  ceFAZolin (ANCEF) IVPB 2g/100 mL premix  Status:  Discontinued        2 g 200 mL/hr  over 30 Minutes Intravenous To Radiology 08/05/20 1201 08/05/20 1244   07/25/20 1700  vancomycin (VANCOCIN) IVPB 1000 mg/200 mL premix  Status:  Discontinued        1,000 mg 200 mL/hr over 60 Minutes Intravenous Every 24 hours 07/24/20 1606 07/27/20 0815   07/24/20 1700  vancomycin (VANCOREADY) IVPB 1750 mg/350 mL        1,750 mg 175 mL/hr over 120 Minutes Intravenous  Once 07/24/20 1606 07/24/20 2005   07/19/20 1000  Ampicillin-Sulbactam (UNASYN) 3 g in sodium chloride 0.9 % 100 mL IVPB        3 g 200 mL/hr over 30 Minutes Intravenous Every 6 hours 07/19/20 0848 07/28/20 0342   07/18/20 1200  Ampicillin-Sulbactam (UNASYN) 3 g in sodium chloride 0.9 % 100 mL IVPB  Status:  Discontinued        3 g 200 mL/hr over 30 Minutes Intravenous Every 8 hours 07/18/20 0720 07/19/20 0848   07/14/20 1530  Ampicillin-Sulbactam (UNASYN) 3 g in sodium chloride 0.9 % 100 mL IVPB  Status:  Discontinued        3 g 200 mL/hr over 30 Minutes Intravenous Every 6 hours 07/14/20 1440 07/18/20 0720   07/13/20 2000  meropenem (MERREM) 2 g in sodium chloride 0.9 % 100 mL  IVPB  Status:  Discontinued        2 g 200 mL/hr over 30 Minutes Intravenous Every 12 hours 07/13/20 1433 07/14/20 1440   07/13/20 1432  vancomycin variable dose per unstable renal function (pharmacist dosing)  Status:  Discontinued         Does not apply See admin instructions 07/13/20 1433 07/15/20 0937   07/13/20 1400  vancomycin (VANCOCIN) IVPB 750 mg/150 ml premix  Status:  Discontinued        750 mg 150 mL/hr over 60 Minutes Intravenous Every 12 hours 07/13/20 1321 07/13/20 1430   07/12/20 1248  vancomycin (VANCOREADY) IVPB 750 mg/150 mL  Status:  Discontinued        750 mg 150 mL/hr over 60 Minutes Intravenous Every 12 hours 07/12/20 1248 07/13/20 1321   07/06/2020 2300  meropenem (MERREM) 2 g in sodium chloride 0.9 % 100 mL IVPB  Status:  Discontinued        2 g 200 mL/hr over 30 Minutes Intravenous Every 8 hours 07/03/2020 2209 07/13/20 1433   06/13/2020 2205  vancomycin variable dose per unstable renal function (pharmacist dosing)  Status:  Discontinued         Does not apply See admin instructions 07/09/2020 2209 07/12/20 1248   07/05/2020 1845  vancomycin (VANCOREADY) IVPB 1500 mg/300 mL        1,500 mg 150 mL/hr over 120 Minutes Intravenous  Once 07/07/2020 1843 06/27/2020 2132   07/04/2020 1845  ceFEPIme (MAXIPIME) 2 g in sodium chloride 0.9 % 100 mL IVPB        2 g 200 mL/hr over 30 Minutes Intravenous  Once 07/09/2020 1843 06/13/2020 1952        V. Leia Alf, M.D., [facility] Office: [phone] Pager:  [phone]

## 2020-09-06 NOTE — Progress Notes (Signed)
Video connection link was texted x2 to participant via mobile number provided.  Connection successful.

## 2020-09-06 NOTE — Progress Notes (Signed)
No charge Progress Note  I examined the patient this morning along with his ICU nurse at bedside.  No acute issues reported.  No further bleeding reported at that time.  Since then however per Ms. Ebony Hail, NP report, patient had some oozing around tracheostomy site and from the mouth.  He is on Eliquis.  Nephrology confirmed that patient is not getting heparin across dialysis.  Eliquis now held and patient switched to IV heparin so can be monitored for worsening bleeding.  Also patient is planned for AV graft placement next week for vascular surgery.  PEG tube nonfunctional, needs to be exchanged by IR but PEG tubes are on backorder.  Patient thereby has a core track now.  Hopefully this is only short-term.  Patient did not appear in any distress.  Was alert and looking around.  Remained on vent.  Stable.  Still has significant upper extremity edema/1+.  Not much lower extremity edema.  Had HD yesterday and reportedly 2 L removed.  Volume management across HD.  Sacral wound reportedly does not look good.  Per RN, patient being reposition every 2 hours.  Can ask WOCN RN for a re look.  Has persistent leukocytosis but no clear focus or concern for infectious etiology at this point.  As per communication with ID MD on-call, continue to monitor off of antibiotics.  Vernell Leep, MD, [place], [facility]. [facility]  To contact the attending provider between 7A-7P or the covering provider during after hours 7P-7A, please log into the web site [email or web address] and access using universal [place] password for that web site. If you do not have the password, please call the hospital operator.

## 2020-09-06 NOTE — Progress Notes (Signed)
ANTICOAGULATION CONSULT NOTE - Initial Consult  Pharmacy Consult for Heparin Indication: atrial fibrillation while apixaban on hold  Allergies  Allergen Reactions   Adhesive [Tape] Rash    Patient Measurements: Height: '5\' 6"'$  (167.6 cm) Weight: 74.6 kg (164 lb 7.4 oz) IBW/kg (Calculated) : 63.8 Heparin Dosing Weight: 74.6 kg   Vital Signs: Temp: 97.7 F (36.5 C) (08/25 0746) Temp Source: Oral (08/25 0746) BP: 160/87 (08/25 0600) Pulse Rate: 103 (08/25 0819)  Labs: Recent Labs    09/03/20 1855 09/04/20 0437 09/05/20 0349  HGB 9.4* 8.8* 8.6*  HCT 29.1* 28.2* 27.0*  PLT  --  479* 445*  CREATININE  --  1.15 1.51*    Estimated Creatinine Clearance: 41.1 mL/min (A) (by C-G formula based on SCr of 1.51 mg/dL (H)).   Medical History: Past Medical History:  Diagnosis Date   Anemia    Anxiety    Chronic respiratory failure (HCC)    DM2 (diabetes mellitus, type 2) (HCC)    Functional quadriplegia (HCC)    Hypertension    Multiple drug resistant organism (MDRO) culture positive    Urine retention    UTI (urinary tract infection)     Assessment: 70 yo male admitted 07/05/2020 on apixaban for Afib. Pharmacy consulted to dose heparin with oral and peri tracheal bleeding. Minimal bleeding in mouth and around trach site - per RN possibly from patient biting lip. No other bleeding noted. Last dose apixaban 8/24 at 1430 (doses on 8/24 PM held per MD). No CBC today - hgb 8.6 and plt elevated on 8/24.   Goal of Therapy:  Heparin level 0.3-0.7 units/ml aPTT 66-102 seconds Monitor platelets by anticoagulation protocol: Yes   Plan:  Start heparin 1150 units/hr Check 8 hr aPTT and heparin level - will monitor by aPTT until heparin level and aPTT correlating due to previous apixaban falsely elevating heparin level  Obtain CBC now Monitor heparin level, aPTT, CBC and s/s of bleeding daily  Follow up bleeding closely  Follow up plans for AVG - likely next week (specific date not  scheduled yet)  Cristela Felt, PharmD, BCPS Clinical Pharmacist 09/06/2020 9:17 AM

## 2020-09-06 NOTE — Progress Notes (Addendum)
[facility] PROGRESS NOTE  Luis Orr ZOX:[id number] DOB: [birth year] DOA: 07/06/2020 PCP: Townsend Roger, MD  Status: Remains inpatient appropriate because:Hemodynamically unstable, Persistent severe electrolyte disturbances, Unsafe d/c plan, and Inpatient level of care appropriate due to severity of illness  Dispo: The patient is from: [facility] vent capable              Anticipated d/c is to: SNF Vent capable w/ access to in facility/stretcher based HD-a facility in [place] has been located in preauthorization in progress.              Patient currently is not medically stable to d/c.   Difficult to place patient Yes    Level of care: ICU  Code Status: Partial (no CPR and no defibrillation or cardioversion) Family Communication: Wife June on 8/22.  Of note the wife is also the POA DVT prophylaxis: Eliquis COVID vaccination status: Unknown    HPI:  70 y.o. 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 Patient presented secondary to altered mental status and hypotension with recurrent pneumonia requiring vasopressor support and ICU admission.he has had multiple recent admissions for sepsis secondary to various infections including UTIs, pneumonias and bacteremia in setting of MDR Klebsiella, MRSA and pseudomonas. Most recent admission September 2021 in setting of sepsis due to MDR Klebsiella with right ureteral calculus s/p double J ureteral stent placement and treated with meropenem x 10days. He was discharged back to [facility].  ICU significant events: 6/29 admitted to ICU for septic shock on levo to maintain MAP>65, continued on vent support 6/29 Blood Cx>> staph species in 1 of 4 cultures drawn>> suspect contaminant 6/30 Off pressors 7/2 ID Consult for MDR acinetobacter in trach asp, Meropenem changed to unasyn 7/2 PRBC transfusion, iron replacement 7/5 Pressors added again. Central line placed.  Drainage from gastrostomy tube >> CT Abd without fluid collection around gastrostomy tube but with diffuse body wall edema. Echo EF 50-55%, mild LVH 7/6 Renal Replacement Therapy ; Code status changed to partial Code (DNR) 7/7 Transfuse 1U PRBC 7/9 add solu cortef 7/10 off pressors, weaning stress steroids 7/11 Pressors added back on again 7/12 ID consult due to rising WBC, hypothermia, hypotension concern for worsening infection. Vancomycin added. Repeat blood cx collected. 7/15 CRRT stopped 7/17 Restarted on low dose levo. No evidence of new infection. Held off on antibiotics. Nephrology contacted family and decision was made to continue iHD this week to allow additional time for renal recovery. Patient is not the best long term iHD candidate but family wants aggressive care 7/18 plans for iHD, tolerated with 2L removed 719 slight drop in hgb to 6.9 will transfuse 1 unit PRBC 7/22 > 7/26: Tolerating HD. TOC consulted for placement. Will likely need out of state placement. 8/15 WBCs greater than 28,000, hypoglycemia, procalcitonin >18, chest x-ray consistent with pneumonia, recent blood cultures consistent with contamination.  Unasyn IV resumed after sputum culture obtained 8/16 consultation placed with ethics committee regarding evaluation for futility of care 8/16 Sputum cx + for Acinetobacter with sensitivities pending 8/17 extensive conversation held with patient's wife regarding patient's poor prognosis and reemergence of Acinetobacter VAP.  Also discussed with her that patient has been able to convey to multiple staff that he does not want to continue on the ventilator or with dialysis.  Wife reports that the patient does not understand what he is saying, she is the POA and that she wants to continue aggressive care. 8/18 patient now  telling nephrology, myself and the rest of the staff he replies yes to wanting to remain on dialysis and on the ventilator and this direct question is  asked. 8/18 Acinetobacter sensitive to Unasyn only.  Discussed with ID/Dr. Juleen China.  Plan is to administer antibiotics for 7-day since patient has not had any increased O2 needs and remains afebrile.  If any concerns over worsening symptoms can give an additional 3 days. Developed moderate bleeding around tracheostomy site on 8/19.  Likely related to combination of Eliquis and heparin required for CRRT.  We will hold Eliquis for now and monitor degree of bleeding Eliquis resumed on 8/22.  Overnight into 8/23 patient bit his tongue and had bleeding.  Also had some bleeding around trach likely from oral bleeding.  Also though did have bleeding from old central line site. 8/23 had transient bleeding around trach and prior central line insertion site overnight.  Patient had bitten tongue and this likely explain the bleeding.  This morning patient was not having any bleeding.   Subjective: Alert.  Still having some back pain when asked.  Objective: Vitals:   09/06/20 0500 09/06/20 0600  BP: 132/80 (!) 160/87  Pulse: 97 (!) 107  Resp: 20 20  Temp:    SpO2: 99% 95%    Intake/Output Summary (Last 24 hours) at 09/06/2020 0733 Last data filed at 09/06/2020 0600 Gross per 24 hour  Intake 540 ml  Output 1725 ml  Net -1185 ml   Filed Weights   09/04/20 0500 09/05/20 1900 09/05/20 2300  Weight: 76.4 kg 76.5 kg 74.6 kg    Exam:  Constitutional: NAD, awake and alert Respiratory: Trach: 6.0 XLT cuffed, Vent settings: PRVC mode, Fio2 40%, rate 20, VT 510, PEEP 5; lung sounds are coarse with expiratory rhonchi today primarily in the mid fields anteriorly expiratory effort per vent (diaphragm paralyzed).  Noted to be oozing mixture of fresh blood and frothy clear sputum around trach site without any clotting or obvious signs of direct bleeding.  Also having bloody drainage from mouth at times Cardiovascular: Maintaining SR/ST, normotensive.  Focal dependent bilateral hand edema improving. Abdomen: LBM  8/25, PEG site clogged.  Past core track in place for feedings until feeding tube that is on backorder available Left lower quadrant colostomy in place, abdomen soft and nondistended with normoactive bowel sounds. Neurologic: CN 2-12 grossly intact on visual inspection- patient with quadriplegia so unable to independently move extremities.  No definitive sensation of extremities when checked. Psychiatric: Awake and nods head appropriately to questions asked.     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 Since initiation of Eliquis patient has redeveloped oral and peritracheal bleeding that is more of an ooze mixing with oral and tracheal secretions.  Discussed with attending and plan is to DC Eliquis and monitor how patient does on IV heparin infusion.  Of note patient is not receiving heparin during dialysis treatments.  Recurrent ventilator associated pneumonia 2/2 recurrent Acinetobacter/sepsis without shock WBC trend: 28.7 > 26.9 > 28.8 > 33.9 > 29.6 > 27.8 > 24.5 > 23.5 > 22.1 > 39.0-discussed with Dr. Juleen China who agrees that since patient afebrile, hemodynamically stable and maintaining appropriate mentation in addition to not having increased O2 requirements that no further antibiotic treatment is necessary Procalcitonin trend: 18.73 > 14.42 > 5.54 >  6.35 > 5.39 > 4.42 > 3.61 > 3.03 > 3.55 > 3.53 > 5.03-DC SINCE VALIDITY IN ESRD NOT CONFIRMED CRP trend: 28.4 > 25.5 > 25.4 > 19.7 8/16 sputum culture + Acinetobacter Baumanii and as of 8/18 sensitive to Unasyn only. 8/18 Discussed with ID/Dr Juleen China recommends 7 days of therapy -completed on 8/22 $Remov'@Presented'KIuFxb$  initially with ventilator associated pneumonia secondary to Acinetobacter and  completed 14 days of Unasyn@   AKI with oliguria/volume overload/ New CKD requiring HD Anuric Nephrology following, initially required CRRT and has transition to HD (due to staffing issues continues on CRRT at bedside) Palliative care has been consulted and are following.  [place] placed on 7/26-nephrology aware of need for permanent access before discharge.  I will contact [facility]   Septic shock 2/2 VAP POA Presented with sepsis physiology and profound shock which has now resolved  Chronic thoracic back pain Continue Oxy and fentanyl prn Better on scheduled Skelaxin and lidocaine patch  History of paroxysmal atrial fibrillation Patient was on amiodarone prior to admission -currently maintaining sinus rhythm Eliquis will be discontinued today given reemergence of low-grade bleeding as documented above.  Instead we will use IV heparin for stroke prophylaxis.  If patient continues to bleed we may need to weigh risk versus benefits of continuing chronic anticoagulation. TSH was WNL this admission QTc 420 ms on 3/55   Acute metabolic encephalopathy Resolved Secondary to acute illness. CT head negative for acute process.  Back at baseline.   Diabetes mellitus, type 2 Currently on Semglee 25 units daily.  CBG is down in the 70s due to recent issues related to clogged PEG tube and inability to administer tube feeding.  Hold the 25 units today and give only 10 units for now. 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  Resolved. 8/23 total bilirubin, AST and ALT have all normalized  Quadriplegia secondary to compressive myelopathy.  Previously resided at [facility] but with poor performance scores as well as poor rehabilitation potential  [facility] will not accept him back if he is HD dependent (he must be able to sit up in a chair for dialysis.)   Microcytic anemia/Acute anemia superimposed on anemia of chronic kidney disease Patient required 4 units of PRBC to date. Thought to be secondary to a combination of acute illness, chronic disease and iron deficiency. IV iron given.  Hemoglobin on 8/23 was 8.8 Continue Aranesp   Severe protein calorie malnutrition Nutrition Problem: Increased nutrient needs Etiology: wound healing Signs/Symptoms: estimated needs Interventions: Tube feeding, Prostat, MVI Estimated body mass index is 26.55 kg/m as calculated from the following:   Height as of this encounter: $RemoveBeforeD'5\' 6"'[id number]$  (1.676 m).   Weight as of this encounter: 74.6 kg.  Patient was given pancreatic enzymes with sodium bicarbonate with resolution of obstruction 8/23 but tube became obstructed again overnight and 8/24.  Unsuccessful with attempt at pancreatic enzyme/sodium bicarb and flush.  Pressure injury/stage IV sacral decubitus Stage IV medial/posterior sacrum present on admission.  Right/posterior/lateral ankle unknown if present on admission.  DTI left ear, not present on admission. (For additional documentation see wound care notes) 8/24 nursing states that stage IV decubitus on the sacrum is looking worse.  Currently has normal saline dressings every shift.  Wound care team contacted to reevaluate  wound.  I suspect patient will need enzymatic treatment along with hydrotherapy.  I have asked wound care to take pictures to document appearance of wound.    Data Reviewed: Basic Metabolic Panel: Recent Labs  Lab 08/31/20 0419 09/01/20 0428 09/02/20 0347 09/04/20 0437 09/05/20 0349  NA 133* 132* 133* 134* 133*  K 4.2 3.3* 3.3* 4.4 4.4  CL 96* 93* 96* 97* 96*  CO2 $Re'23 26 24 26 24  'AEs$ GLUCOSE 181* 152* 190* 104* 145*  BUN 92* 52* 70* 47* 68*  CREATININE 1.69* 1.11 1.48* 1.15 1.51*  CALCIUM 9.6 9.0 8.8* 8.7* 8.8*  MG  --   --    --   --  2.2  PHOS  --   --   --   --  5.4*   Liver Function Tests: Recent Labs  Lab 08/31/20 0419 09/04/20 0437 09/05/20 0349  AST 33 30 26  ALT $Re'27 21 20  'sot$ ALKPHOS 261* 223* 204*  BILITOT 1.0 0.5 0.5  PROT 6.7 7.2 6.9  ALBUMIN 1.6* 1.7* 1.7*   No results for input(s): LIPASE, AMYLASE in the last 168 hours. No results for input(s): AMMONIA in the last 168 hours. CBC: Recent Labs  Lab 08/30/20 0907 08/31/20 0419 09/01/20 0428 09/02/20 0347 09/03/20 1855 09/04/20 0437 09/05/20 0349  WBC 33.9* 29.6* 27.8* 24.5*  --  23.5* 22.1*  NEUTROABS 27.8* 23.0*  --   --   --  17.9*  --   HGB 9.4* 8.7* 8.7* 8.7* 9.4* 8.8* 8.6*  HCT 29.3* 27.7* 26.9* 26.9* 29.1* 28.2* 27.0*  MCV 83.0 82.9 82.3 82.0  --  82.5 82.3  PLT 487* 509* 478* 463*  --  479* 445*   Cardiac Enzymes: No results for input(s): CKTOTAL, CKMB, CKMBINDEX, TROPONINI in the last 168 hours. BNP (last 3 results) Recent Labs    07/02/2020 1922  BNP 135.0*    ProBNP (last 3 results) No results for input(s): PROBNP in the last 8760 hours.  CBG: Recent Labs  Lab 09/05/20 1139 09/05/20 1616 09/05/20 2013 09/05/20 2356 09/06/20 0402  GLUCAP 106* 115* 113* 175* 220*       Studies: DG Abd Portable 1V  Result Date: 09/05/2020 CLINICAL DATA:  Feeding tube placement EXAM: PORTABLE ABDOMEN - 1 VIEW COMPARISON:  09/01/2020 FINDINGS: Feeding tube is transpyloric with the tip in the distal duodenum. Nonobstructive bowel gas pattern. IMPRESSION: Feeding tube tip in the distal duodenum. Electronically Signed   By: Rolm Baptise M.D.   On: 09/05/2020 17:30   VAS [place] UPPER EXT VEIN MAPPING (PRE-OP AVF)  Result Date: 09/05/2020 UPPER EXTREMITY VEIN MAPPING Patient Name:  Luis Orr  Date of Exam:   09/05/2020 Medical Rec #: [id number]          Accession #:    [id number] Date of Birth: [birth year]         Patient Gender: M Patient Age:   38 years Exam Location[facility] Procedure:      VAS [place] UPPER EXT VEIN  MAPPING (PRE-OP AVF) Referring Phys: Karoline Caldwell --------------------------------------------------------------------------------  Indications: Pre-access. Comparison Study: No previous Performing Technologist: Jody Hill RVT, RDMS  Examination Guidelines: A complete evaluation includes B-mode imaging, spectral Doppler, color Doppler, and power Doppler as needed of all accessible portions of each vessel. Bilateral testing is considered an integral part of a complete examination. Limited examinations for reoccurring indications may be performed as noted. +-----------------+-------------+----------+--------------+ Right Cephalic   Diameter (cm)Depth (cm)   Findings    +-----------------+-------------+----------+--------------+  Shoulder             0.12        0.72                  +-----------------+-------------+----------+--------------+ Prox upper arm       0.18        0.57                  +-----------------+-------------+----------+--------------+ Mid upper arm        0.14        0.92                  +-----------------+-------------+----------+--------------+ Dist upper arm       0.11        0.89                  +-----------------+-------------+----------+--------------+ Antecubital fossa    0.17        0.52      Thrombus    +-----------------+-------------+----------+--------------+ Prox forearm         0.10        0.74                  +-----------------+-------------+----------+--------------+ Mid forearm          0.11        0.80     branching    +-----------------+-------------+----------+--------------+ Dist forearm         0.11        0.66                  +-----------------+-------------+----------+--------------+ Wrist                                   not visualized +-----------------+-------------+----------+--------------+ +-----------------+-------------+----------+--------------+ Right Basilic    Diameter (cm)Depth (cm)   Findings     +-----------------+-------------+----------+--------------+ Shoulder                                not visualized +-----------------+-------------+----------+--------------+ Prox upper arm                          not visualized +-----------------+-------------+----------+--------------+ Mid upper arm                           not visualized +-----------------+-------------+----------+--------------+ Dist upper arm                          not visualized +-----------------+-------------+----------+--------------+ Antecubital fossa                       not visualized +-----------------+-------------+----------+--------------+ Prox forearm                            not visualized +-----------------+-------------+----------+--------------+ Mid forearm                             not visualized +-----------------+-------------+----------+--------------+ Distal forearm                          not visualized +-----------------+-------------+----------+--------------+ Wrist  not visualized +-----------------+-------------+----------+--------------+ +-----------------+-------------+----------+--------------+ Left Cephalic    Diameter (cm)Depth (cm)   Findings    +-----------------+-------------+----------+--------------+ Shoulder             0.14        0.62                  +-----------------+-------------+----------+--------------+ Prox upper arm       0.19        0.65                  +-----------------+-------------+----------+--------------+ Mid upper arm        0.11        0.76   not visualized +-----------------+-------------+----------+--------------+ Dist upper arm       0.12        0.46                  +-----------------+-------------+----------+--------------+ Antecubital fossa    0.15        0.30   not visualized +-----------------+-------------+----------+--------------+ Prox forearm         0.14         0.34                  +-----------------+-------------+----------+--------------+ Mid forearm          0.12        0.50                  +-----------------+-------------+----------+--------------+ Dist forearm                            not visualized +-----------------+-------------+----------+--------------+ Wrist                                   not visualized +-----------------+-------------+----------+--------------+ +-----------------+-------------+----------+--------------+ Left Basilic     Diameter (cm)Depth (cm)   Findings    +-----------------+-------------+----------+--------------+ Prox upper arm       0.30                              +-----------------+-------------+----------+--------------+ Mid upper arm        0.26                              +-----------------+-------------+----------+--------------+ Dist upper arm       0.19                              +-----------------+-------------+----------+--------------+ Antecubital fossa    0.20                              +-----------------+-------------+----------+--------------+ Prox forearm         0.14                              +-----------------+-------------+----------+--------------+ Mid forearm          0.08                              +-----------------+-------------+----------+--------------+ Distal forearm  not visualized +-----------------+-------------+----------+--------------+ Wrist                                   not visualized +-----------------+-------------+----------+--------------+ *See table(s) above for measurements and observations.  Diagnosing physician: Harold Barban MD Electronically signed by Harold Barban MD on 09/05/2020 at 8:13:16 PM.    Final     Scheduled Meds:  amiodarone  200 mg Per Tube Daily   apixaban  5 mg Per Tube BID   buPROPion  75 mg Per Tube BID   chlorhexidine gluconate (MEDLINE KIT)  15 mL Mouth Rinse BID    Chlorhexidine Gluconate Cloth  6 each Topical Q0600   Chlorhexidine Gluconate Cloth  6 each Topical Q0600   darbepoetin (ARANESP) injection - DIALYSIS  40 mcg Intravenous Q Mon-HD   feeding supplement (PROSource TF)  45 mL Per Tube TID   guaiFENesin  5 mL Per Tube BID   insulin aspart  0-9 Units Subcutaneous Q4H   insulin glargine-yfgn  25 Units Subcutaneous Daily   lidocaine  1 patch Transdermal Q24H   mouth rinse  15 mL Mouth Rinse 10 times per day   metaxalone  800 mg Per Tube TID   midodrine  5 mg Per Tube Q8H   multivitamin  1 tablet Per Tube QHS   pantoprazole sodium  40 mg Per Tube QHS   scopolamine  1 patch Transdermal Q72H   sertraline  100 mg Per Tube Daily   sodium bicarbonate  650 mg Per Tube Once   sodium chloride flush  10-40 mL Intracatheter Q12H   Continuous Infusions:  sodium chloride Stopped (07/26/20 0824)   sodium chloride 500 mL (09/01/20 1408)   sodium chloride     feeding supplement (NEPRO CARB STEADY) 1,000 mL (09/05/20 1818)    Active Problems:   Septic shock (HCC)   Pressure injury of skin   Acute respiratory failure with hypoxia (HCC)   AKI (acute kidney injury) ([place])   Hypotension   Goals of care, counseling/discussion   Quadriplegia ([place])   Chronic kidney disease requiring chronic dialysis ([place])   Hyperglycemia   Sepsis due to Acinetobacter baumannii ([place])   Pneumonia due to Acinetobacter species ([place])   Dysautonomia ([place])   Chronic midline thoracic back pain   Consultants: PCCM Nephrology Infectious disease Palliative care medicine  Procedures: Echocardiogram Insertion of double-lumen HD catheter  Antibiotics: Cefepime x1 dose Meropenem 6/29 through 7/2 Vancomycin 6/29 through 6/30 Unasyn 7/2 through 7/15 Unasyn 8/15 >   Time spent: 35 minutes    Erin Hearing ANP  [facility] 7 am - 330 pm/M-F for direct patient care and secure chat Please refer to Amion for contact info 57  days

## 2020-09-07 ENCOUNTER — Inpatient Hospital Stay (HOSPITAL_COMMUNITY): Payer: 59

## 2020-09-07 DIAGNOSIS — A419 Sepsis, unspecified organism: Secondary | ICD-10-CM | POA: Diagnosis not present

## 2020-09-07 DIAGNOSIS — G901 Familial dysautonomia [Riley-Day]: Secondary | ICD-10-CM | POA: Diagnosis not present

## 2020-09-07 DIAGNOSIS — A4159 Other Gram-negative sepsis: Secondary | ICD-10-CM | POA: Diagnosis not present

## 2020-09-07 DIAGNOSIS — G8250 Quadriplegia, unspecified: Secondary | ICD-10-CM | POA: Diagnosis not present

## 2020-09-07 HISTORY — PX: IR REPLACE G-TUBE SIMPLE WO FLUORO: IMG2323

## 2020-09-07 LAB — GLUCOSE, CAPILLARY
Glucose-Capillary: 126 mg/dL — ABNORMAL HIGH (ref 70–99)
Glucose-Capillary: 138 mg/dL — ABNORMAL HIGH (ref 70–99)
Glucose-Capillary: 140 mg/dL — ABNORMAL HIGH (ref 70–99)
Glucose-Capillary: 143 mg/dL — ABNORMAL HIGH (ref 70–99)
Glucose-Capillary: 156 mg/dL — ABNORMAL HIGH (ref 70–99)
Glucose-Capillary: 158 mg/dL — ABNORMAL HIGH (ref 70–99)
Glucose-Capillary: 162 mg/dL — ABNORMAL HIGH (ref 70–99)
Glucose-Capillary: 182 mg/dL — ABNORMAL HIGH (ref 70–99)

## 2020-09-07 LAB — HEPARIN LEVEL (UNFRACTIONATED): Heparin Unfractionated: 0.76 IU/mL — ABNORMAL HIGH (ref 0.30–0.70)

## 2020-09-07 LAB — CBC
HCT: 26.6 % — ABNORMAL LOW (ref 39.0–52.0)
Hemoglobin: 8.5 g/dL — ABNORMAL LOW (ref 13.0–17.0)
MCH: 26.1 pg (ref 26.0–34.0)
MCHC: 32 g/dL (ref 30.0–36.0)
MCV: 81.6 fL (ref 80.0–100.0)
Platelets: 445 10*3/uL — ABNORMAL HIGH (ref 150–400)
RBC: 3.26 MIL/uL — ABNORMAL LOW (ref 4.22–5.81)
RDW: 19.6 % — ABNORMAL HIGH (ref 11.5–15.5)
WBC: 28.3 10*3/uL — ABNORMAL HIGH (ref 4.0–10.5)
nRBC: 0.6 % — ABNORMAL HIGH (ref 0.0–0.2)

## 2020-09-07 LAB — RENAL FUNCTION PANEL
Albumin: 1.7 g/dL — ABNORMAL LOW (ref 3.5–5.0)
Anion gap: 18 — ABNORMAL HIGH (ref 5–15)
BUN: 56 mg/dL — ABNORMAL HIGH (ref 8–23)
CO2: 24 mmol/L (ref 22–32)
Calcium: 9.3 mg/dL (ref 8.9–10.3)
Chloride: 88 mmol/L — ABNORMAL LOW (ref 98–111)
Creatinine, Ser: 1.18 mg/dL (ref 0.61–1.24)
GFR, Estimated: >60 mL/min (ref >60)
Glucose, Bld: 163 mg/dL — ABNORMAL HIGH (ref 70–99)
Phosphorus: 4.4 mg/dL (ref 2.5–4.6)
Potassium: 3.2 mmol/L — ABNORMAL LOW (ref 3.5–5.1)
Sodium: 130 mmol/L — ABNORMAL LOW (ref 135–145)

## 2020-09-07 LAB — APTT
aPTT: 55 seconds — ABNORMAL HIGH (ref 24–36)
aPTT: 61 seconds — ABNORMAL HIGH (ref 24–36)

## 2020-09-07 LAB — PROCALCITONIN: Procalcitonin: 7.48 ng/mL

## 2020-09-07 MED ORDER — PENTAFLUOROPROP-TETRAFLUOROETH EX AERO: 1.0000 "application " | INHALATION_SPRAY | CUTANEOUS | Status: DC | PRN

## 2020-09-07 MED ORDER — HEPARIN SODIUM (PORCINE) 1000 UNIT/ML DIALYSIS
20.0000 [IU]/kg | INTRAMUSCULAR | Status: DC | PRN
  Administered 2020-10-18: 1600 [IU] via INTRAVENOUS_CENTRAL
  Filled 2020-09-07 (×3): qty 2

## 2020-09-07 MED ORDER — LIDOCAINE VISCOUS HCL 2 % MT SOLN
OROMUCOSAL | Status: AC
  Administered 2020-09-07: 2 mL
  Filled 2020-09-07: qty 15

## 2020-09-07 MED ORDER — SODIUM CHLORIDE 0.9 % IV SOLN: 100.0000 mL | INTRAVENOUS | Status: DC | PRN

## 2020-09-07 MED ORDER — HEPARIN SODIUM (PORCINE) 1000 UNIT/ML DIALYSIS
1000.0000 [IU] | INTRAMUSCULAR | Status: DC | PRN
  Filled 2020-09-07 (×4): qty 1

## 2020-09-07 MED ORDER — DIATRIZOATE MEGLUMINE & SODIUM 66-10 % PO SOLN
ORAL | Status: AC
  Filled 2020-09-07: qty 30

## 2020-09-07 MED ORDER — CHLORHEXIDINE GLUCONATE CLOTH 2 % EX PADS: 6.0000 | MEDICATED_PAD | Freq: Every day | CUTANEOUS | Status: DC

## 2020-09-07 MED ORDER — LIDOCAINE-PRILOCAINE 2.5-2.5 % EX CREA: 1.0000 "application " | TOPICAL_CREAM | CUTANEOUS | Status: DC | PRN

## 2020-09-07 MED ORDER — CHLORHEXIDINE GLUCONATE CLOTH 2 % EX PADS
6.0000 | MEDICATED_PAD | Freq: Every day | CUTANEOUS | Status: DC
  Administered 2020-09-08 – 2020-09-29 (×19): 6 via TOPICAL

## 2020-09-07 MED ORDER — ALTEPLASE 2 MG IJ SOLR
2.0000 mg | Freq: Once | INTRAMUSCULAR | Status: DC | PRN
  Filled 2020-09-07: qty 2

## 2020-09-07 MED ORDER — LIDOCAINE HCL (PF) 1 % IJ SOLN: 5.0000 mL | INTRAMUSCULAR | Status: DC | PRN

## 2020-09-07 MED ORDER — MIDODRINE HCL 5 MG PO TABS
5.0000 mg | ORAL_TABLET | Freq: Three times a day (TID) | ORAL | Status: DC
  Administered 2020-09-07 – 2020-10-04 (×42): 5 mg
  Filled 2020-09-07 (×53): qty 1

## 2020-09-07 NOTE — Progress Notes (Signed)
[facility] NEPHROLOGY PROGRESS NOTE  Assessment/ Plan:  # Prolonged dialysis dependent AKI, now ESRD:  renal replacement therapy started 7/6; Initially on CRRT and now tolerating iHD On MWF scheudle, 3h, 4K, 2-3L UF, HD cath, no heparin Using [facility] but AVF/G req by SNF, AVG planned for next week  # Septic shock/ventilator associated pneumonia due to recurrent Acinetobacter infection: Hemodynamically stable off pressors.  Off ABX, per TRH/CCM  #  Chronic respiratory failure: Status post tracheostomy with ventilator management per CCM.  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  # CKD-BMD: Ca and P stable, off binders. CTM  # Anemia: contiue ESA, monitor Hb.   # Acute metabolic encephalopathy: Continue management as above.,  Supportive care.  #Mild hypokalemia: CTM  Subjective:  No interval events  Objective Vital signs in last 24 hours: Vitals:   09/07/20 0600 09/07/20 0700 09/07/20 0748 09/07/20 0800  BP: 112/64 123/74    Pulse: 89 88 86   Resp: 20 20 20    Temp:    98.4 F (36.9 C)  TempSrc:    Axillary  SpO2: 100% 100% 100%   Weight:      Height:       Weight change: -1.9 kg  Intake/Output Summary (Last 24 hours) at 09/07/2020 1104 Last data filed at 09/07/2020 0700 Gross per 24 hour  Intake 1133.76 ml  Output 275 ml  Net 858.76 ml        Labs: Basic Metabolic Panel: Recent Labs  Lab 09/04/20 0437 09/05/20 0349 09/07/20 0528  NA 134* 133* 130*  K 4.4 4.4 3.2*  CL 97* 96* 88*  CO2 26 24 24   GLUCOSE 104* 145* 163*  BUN 47* 68* 56*  CREATININE 1.15 1.51* 1.18  CALCIUM 8.7* 8.8* 9.3  PHOS  --  5.4* 4.4    Liver Function Tests: Recent Labs  Lab 09/04/20 0437 09/05/20 0349 09/07/20 0528  AST 30 26  --   ALT 21 20  --   ALKPHOS 223* 204*  --   BILITOT 0.5 0.5  --   PROT 7.2 6.9  --   ALBUMIN 1.7*  1.7* 1.7*    No results for input(s): LIPASE, AMYLASE in the last 168 hours. No results for input(s): AMMONIA in the last 168 hours. CBC: Recent Labs  Lab 09/02/20 0347 09/03/20 1855 09/04/20 0437 09/05/20 0349 09/06/20 0945 09/07/20 0528  WBC 24.5*  --  23.5* 22.1* 39.0* 28.3*  NEUTROABS  --   --  17.9*  --   --   --   HGB 8.7*   < > 8.8* 8.6* 9.9* 8.5*  HCT 26.9*   < > 28.2* 27.0* 31.7* 26.6*  MCV 82.0  --  82.5 82.3 82.6 81.6  PLT 463*  --  479* 445* 458* 445*   < > = values in this interval not displayed.    Cardiac Enzymes: No results for input(s): CKTOTAL, CKMB, CKMBINDEX, TROPONINI in the last 168 hours. CBG: Recent Labs  Lab 09/06/20 1525 09/06/20 1931 09/07/20 0010 09/07/20 0407 09/07/20 0743  GLUCAP 200* 162* 156* 182* 143*     Iron Studies: No results for input(s): IRON, TIBC, TRANSFERRIN, FERRITIN in the last 72 hours. Studies/Results: DG Abd Portable 1V  Result Date: 09/05/2020 CLINICAL DATA:  Feeding tube placement EXAM: PORTABLE ABDOMEN - 1 VIEW COMPARISON:  09/01/2020 FINDINGS: Feeding tube is transpyloric with the tip in  the distal duodenum. Nonobstructive bowel gas pattern. IMPRESSION: Feeding tube tip in the distal duodenum. Electronically Signed   By: Rolm Baptise M.D.   On: 09/05/2020 17:30   VAS [place] UPPER EXT VEIN MAPPING (PRE-OP AVF)  Result Date: 09/05/2020 UPPER EXTREMITY VEIN MAPPING Patient Name:  Luis Orr  Date of Exam:   09/05/2020 Medical Rec #: [id number]          Accession #:    [id number] Date of Birth: February 28, 1950         Patient Gender: M Patient Age:   70 years Exam Location:  [facility] Procedure:      VAS [place] UPPER EXT VEIN MAPPING (PRE-OP AVF) Referring Phys: Karoline Caldwell --------------------------------------------------------------------------------  Indications: Pre-access. Comparison Study: No previous Performing Technologist: Jody Hill RVT, RDMS  Examination Guidelines: A complete evaluation includes B-mode  imaging, spectral Doppler, color Doppler, and power Doppler as needed of all accessible portions of each vessel. Bilateral testing is considered an integral part of a complete examination. Limited examinations for reoccurring indications may be performed as noted. +-----------------+-------------+----------+--------------+ Right Cephalic   Diameter (cm)Depth (cm)   Findings    +-----------------+-------------+----------+--------------+ Shoulder             0.12        0.72                  +-----------------+-------------+----------+--------------+ Prox upper arm       0.18        0.57                  +-----------------+-------------+----------+--------------+ Mid upper arm        0.14        0.92                  +-----------------+-------------+----------+--------------+ Dist upper arm       0.11        0.89                  +-----------------+-------------+----------+--------------+ Antecubital fossa    0.17        0.52      Thrombus    +-----------------+-------------+----------+--------------+ Prox forearm         0.10        0.74                  +-----------------+-------------+----------+--------------+ Mid forearm          0.11        0.80     branching    +-----------------+-------------+----------+--------------+ Dist forearm         0.11        0.66                  +-----------------+-------------+----------+--------------+ Wrist                                   not visualized +-----------------+-------------+----------+--------------+ +-----------------+-------------+----------+--------------+ Right Basilic    Diameter (cm)Depth (cm)   Findings    +-----------------+-------------+----------+--------------+ Shoulder                                not visualized +-----------------+-------------+----------+--------------+ Prox upper arm                          not visualized +-----------------+-------------+----------+--------------+ Mid  upper arm  not visualized +-----------------+-------------+----------+--------------+ Dist upper arm                          not visualized +-----------------+-------------+----------+--------------+ Antecubital fossa                       not visualized +-----------------+-------------+----------+--------------+ Prox forearm                            not visualized +-----------------+-------------+----------+--------------+ Mid forearm                             not visualized +-----------------+-------------+----------+--------------+ Distal forearm                          not visualized +-----------------+-------------+----------+--------------+ Wrist                                   not visualized +-----------------+-------------+----------+--------------+ +-----------------+-------------+----------+--------------+ Left Cephalic    Diameter (cm)Depth (cm)   Findings    +-----------------+-------------+----------+--------------+ Shoulder             0.14        0.62                  +-----------------+-------------+----------+--------------+ Prox upper arm       0.19        0.65                  +-----------------+-------------+----------+--------------+ Mid upper arm        0.11        0.76   not visualized +-----------------+-------------+----------+--------------+ Dist upper arm       0.12        0.46                  +-----------------+-------------+----------+--------------+ Antecubital fossa    0.15        0.30   not visualized +-----------------+-------------+----------+--------------+ Prox forearm         0.14        0.34                  +-----------------+-------------+----------+--------------+ Mid forearm          0.12        0.50                  +-----------------+-------------+----------+--------------+ Dist forearm                            not visualized  +-----------------+-------------+----------+--------------+ Wrist                                   not visualized +-----------------+-------------+----------+--------------+ +-----------------+-------------+----------+--------------+ Left Basilic     Diameter (cm)Depth (cm)   Findings    +-----------------+-------------+----------+--------------+ Prox upper arm       0.30                              +-----------------+-------------+----------+--------------+ Mid upper arm        0.26                              +-----------------+-------------+----------+--------------+  Dist upper arm       0.19                              +-----------------+-------------+----------+--------------+ Antecubital fossa    0.20                              +-----------------+-------------+----------+--------------+ Prox forearm         0.14                              +-----------------+-------------+----------+--------------+ Mid forearm          0.08                              +-----------------+-------------+----------+--------------+ Distal forearm                          not visualized +-----------------+-------------+----------+--------------+ Wrist                                   not visualized +-----------------+-------------+----------+--------------+ *See table(s) above for measurements and observations.  Diagnosing physician: Harold Barban MD Electronically signed by Harold Barban MD on 09/05/2020 at 8:13:16 PM.    Final     Medications: Infusions:  sodium chloride Stopped (07/26/20 0824)   sodium chloride 500 mL (09/01/20 1408)   sodium chloride     sodium chloride     sodium chloride     feeding supplement (NEPRO CARB STEADY) 1,000 mL (09/06/20 2141)   heparin 1,350 Units/hr (09/07/20 0736)    Scheduled Medications:  amiodarone  200 mg Per Tube Daily   buPROPion  75 mg Per Tube BID   chlorhexidine gluconate (MEDLINE KIT)  15 mL Mouth Rinse BID    [START ON 09/08/2020] Chlorhexidine Gluconate Cloth  6 each Topical Daily   [START ON 09/08/2020] Chlorhexidine Gluconate Cloth  6 each Topical Daily   darbepoetin (ARANESP) injection - DIALYSIS  40 mcg Intravenous Q Mon-HD   feeding supplement (PROSource TF)  45 mL Per Tube TID   guaiFENesin  5 mL Per Tube BID   insulin aspart  0-9 Units Subcutaneous Q4H   insulin glargine-yfgn  25 Units Subcutaneous Daily   lidocaine  1 patch Transdermal Q24H   mouth rinse  15 mL Mouth Rinse 10 times per day   metaxalone  800 mg Per Tube TID   midodrine  5 mg Per Tube TID   multivitamin  1 tablet Per Tube QHS   pantoprazole sodium  40 mg Per Tube QHS   scopolamine  1 patch Transdermal Q72H   sertraline  100 mg Per Tube Daily   sodium bicarbonate  650 mg Per Tube Once   sodium chloride flush  10-40 mL Intracatheter Q12H    have reviewed scheduled and prn medications.  Physical Exam: General:NAD, resting with tracheostomy on vent.  Heart:RRR, s1s2 nl Lungs: Coarse breath sounds bilaterally, no wheezing Abdomen:soft, Non-tender, PEG tube and ostomy bag Extremities:Trace edema Dialysis Access: Right IJ TDC  Elody Kleinsasser B Lisbet Busker 09/07/2020,11:04 AM  LOS: 58 days

## 2020-09-07 NOTE — Progress Notes (Signed)
[facility] PROGRESS NOTE  Luis Orr ZOX:[id number] DOB: [birth year] DOA: 07/09/2020 PCP: Townsend Roger, MD  Status: Remains inpatient appropriate because:Hemodynamically unstable, Persistent severe electrolyte disturbances, Unsafe d/c plan, and Inpatient level of care appropriate due to severity of illness  Dispo: The patient is from: [facility] vent capable              Anticipated d/c is to: SNF Vent capable w/ access to in facility/stretcher based HD-a facility in [place] has been located in preauthorization in progress.              Patient currently is not medically stable to d/c.   Difficult to place patient Yes    Level of care: ICU  Code Status: Partial (no CPR and no defibrillation or cardioversion) Family Communication: Wife June on 8/22.  Of note the wife is also the POA DVT prophylaxis: Eliquis COVID vaccination status: Unknown    HPI:  70 y.o. 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 Patient presented secondary to altered mental status and hypotension with recurrent pneumonia requiring vasopressor support and ICU admission.he has had multiple recent admissions for sepsis secondary to various infections including UTIs, pneumonias and bacteremia in setting of MDR Klebsiella, MRSA and pseudomonas. Most recent admission September 2021 in setting of sepsis due to MDR Klebsiella with right ureteral calculus s/p double J ureteral stent placement and treated with meropenem x 10days. He was discharged back to [facility].  ICU significant events: 6/29 admitted to ICU for septic shock on levo to maintain MAP>65, continued on vent support 6/29 Blood Cx>> staph species in 1 of 4 cultures drawn>> suspect contaminant 6/30 Off pressors 7/2 ID Consult for MDR acinetobacter in trach asp, Meropenem changed to unasyn 7/2 PRBC transfusion, iron replacement 7/5 Pressors added again. Central line placed.  Drainage from gastrostomy tube >> CT Abd without fluid collection around gastrostomy tube but with diffuse body wall edema. Echo EF 50-55%, mild LVH 7/6 Renal Replacement Therapy ; Code status changed to partial Code (DNR) 7/7 Transfuse 1U PRBC 7/9 add solu cortef 7/10 off pressors, weaning stress steroids 7/11 Pressors added back on again 7/12 ID consult due to rising WBC, hypothermia, hypotension concern for worsening infection. Vancomycin added. Repeat blood cx collected. 7/15 CRRT stopped 7/17 Restarted on low dose levo. No evidence of new infection. Held off on antibiotics. Nephrology contacted family and decision was made to continue iHD this week to allow additional time for renal recovery. Patient is not the best long term iHD candidate but family wants aggressive care 7/18 plans for iHD, tolerated with 2L removed 719 slight drop in hgb to 6.9 will transfuse 1 unit PRBC 7/22 > 7/26: Tolerating HD. TOC consulted for placement. Will likely need out of state placement. 8/15 WBCs greater than 28,000, hypoglycemia, procalcitonin >18, chest x-ray consistent with pneumonia, recent blood cultures consistent with contamination.  Unasyn IV resumed after sputum culture obtained 8/16 consultation placed with ethics committee regarding evaluation for futility of care 8/16 Sputum cx + for Acinetobacter with sensitivities pending 8/17 extensive conversation held with patient's wife regarding patient's poor prognosis and reemergence of Acinetobacter VAP.  Also discussed with her that patient has been able to convey to multiple staff that he does not want to continue on the ventilator or with dialysis.  Wife reports that the patient does not understand what he is saying, she is the POA and that she wants to continue aggressive care. 8/18 patient now  telling nephrology, myself and the rest of the staff he replies yes to wanting to remain on dialysis and on the ventilator and this direct question is  asked. 8/18 Acinetobacter sensitive to Unasyn only.  Discussed with ID/Dr. Juleen China.  Plan is to administer antibiotics for 7-day since patient has not had any increased O2 needs and remains afebrile.  If any concerns over worsening symptoms can give an additional 3 days. Developed moderate bleeding around tracheostomy site on 8/19.  Likely related to combination of Eliquis and heparin required for CRRT.  We will hold Eliquis for now and monitor degree of bleeding Eliquis resumed on 8/22.  Overnight into 8/23 patient bit his tongue and had bleeding.  Also had some bleeding around trach likely from oral bleeding.  Also though did have bleeding from old central line site. 8/23 had transient bleeding around trach and prior central line insertion site overnight.  Patient had bitten tongue and this likely explain the bleeding.  This morning patient was not having any bleeding.  On 8/25 had redeveloped slow ooze bleeding from mouth and trach.  Eliquis discontinued in favor of IV heparin especially given patient will undergo surgical procedure for vascular access on 8/29   Subjective: Awakens easily.  Nods appropriately.  Occasionally attempts to mouth words.  Objective: Vitals:   09/07/20 0600 09/07/20 0700  BP: 112/64 123/74  Pulse: 89 88  Resp: 20 20  Temp:    SpO2: 100% 100%    Intake/Output Summary (Last 24 hours) at 09/07/2020 0733 Last data filed at 09/07/2020 0700 Gross per 24 hour  Intake 1324.14 ml  Output 275 ml  Net 1049.14 ml   Filed Weights   09/05/20 2300 09/06/20 0900 09/07/20 0500  Weight: 74.6 kg 74.6 kg 77 kg    Exam:  Constitutional: NAD, awakens easily Respiratory: Trach: 6.0 XLT cuffed, Vent settings: PRVC mode, Fio2 40%, rate 20, VT 510, PEEP 5; lung sounds are coarse with expiratory rhonchi today primarily in the mid fields anteriorly expiratory effort per vent (diaphragm paralyzed).  No further peritracheal bleeding noted Cardiovascular: Maintaining SR/ST without  ectopy, normotensive.  Focal dependent bilateral hand edema slowly improving. Abdomen: LBM 8/26, PEG tube clogged.  Cortrack in place for feedings until back ordered feeding tube available Left lower quadrant colostomy in place, abdomen soft and nondistended with normoactive bowel sounds. Neurologic: CN 2-12 grossly intact on visual inspection- patient with quadriplegia so unable to independently move extremities.  No definitive sensation of extremities when checked. Psychiatric: Awake and nods head appropriately to questions asked.     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 Juleen China who agrees that since patient afebrile, hemodynamically stable and maintaining appropriate mentation in addition to not having increased O2 requirements that no further antibiotic treatment is necessary 8/16 sputum culture + Acinetobacter Baumanii and as of 8/18 sensitive to Unasyn only. 8/18 Discussed with ID/Dr Juleen China recommends 7 days of therapy -completed on 8/22   AKI with oliguria/volume overload/ New CKD requiring HD Anuric Nephrology following, initially required CRRT and has transition to HD (due to staffing issues continues on CRRT at bedside) Palliative care has been consulted and are following.  TDC placed on 7/26-VDS plans permanent access  placement on 8/29    Septic shock 2/2 VAP POA Presented with sepsis physiology and profound shock which has now resolved  Chronic thoracic back pain Continue Oxy and fentanyl prn Better on scheduled Skelaxin and lidocaine patch  History of  paroxysmal atrial fibrillation Patient was on amiodarone prior to admission -currently maintaining  Eliquis has been discontinued due to recurrent peritracheal and oral bleeding.  No further bleeding on IV heparin.  We will continue IV heparin until surgical procedure as outlined above completed TSH was WNL this admission QTc 420 ms on 0/10   Acute metabolic encephalopathy Resolved Secondary to acute illness. CT head negative for acute process.  Back at baseline.   Diabetes mellitus, type 2 Currently on Semglee 25 units daily.  CBG is down in the 70s due to recent issues related to clogged PEG tube and inability to administer tube feeding.  Hold the 25 units today and give only 10 units for now. 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  Resolved. 8/23 total bilirubin, AST and ALT have all normalized  Quadriplegia secondary to compressive myelopathy.  Previously resided at [facility] but with poor performance scores as well as poor rehabilitation potential [facility] will not accept him back if he is HD dependent (he must be able to sit up in a chair for dialysis.)   Microcytic anemia/Acute anemia superimposed on anemia of chronic kidney disease Patient required 4 units of PRBC to date. Thought to be secondary to a combination of acute illness, chronic disease and iron deficiency. IV iron given.  Hemoglobin on 8/26 was 8.5 Continue Aranesp   Severe protein calorie malnutrition Nutrition Problem: Increased nutrient needs Etiology: wound healing Signs/Symptoms: estimated needs Interventions: Tube feeding, Prostat, MVI Estimated body mass index is 27.4 kg/m as calculated from the following:   Height as of this encounter: $RemoveBeforeD'5\' 6"'qORPencHHdXAlo$   (1.676 m).   Weight as of this encounter: 77 kg.  PEG tube unable to be declogged successfully.  Feeding tube on backorder.  Continue Cortrak for feedings and meds.  Pressure injury/stage IV sacral decubitus Stage IV medial/posterior sacrum present on admission.  Right/posterior/lateral ankle unknown if present on admission.  DTI left ear, not present on admission. (For additional documentation see wound care notes) 8/24 nursing states that stage IV decubitus on the sacrum is looking worse.  Currently has normal saline dressings every shift.  Wound care team contacted to reevaluate wound.  I suspect patient will need enzymatic treatment along with hydrotherapy.  I have asked wound care to take pictures to document appearance of wound.    Data Reviewed: Basic Metabolic Panel: Recent Labs  Lab 09/01/20 0428 09/02/20 0347 09/04/20 0437 09/05/20 0349 09/07/20 0528  NA 132* 133* 134* 133* 130*  K 3.3* 3.3* 4.4 4.4 3.2*  CL 93* 96* 97* 96* 88*  CO2 $Re'26 24 26 24 24  'prK$ GLUCOSE 152* 190* 104* 145* 163*  BUN 52* 70* 47* 68* 56*  CREATININE 1.11 1.48* 1.15 1.51* 1.18  CALCIUM 9.0 8.8* 8.7* 8.8* 9.3  MG  --   --   --  2.2  --   PHOS  --   --   --  5.4* 4.4   Liver Function Tests: Recent Labs  Lab 09/04/20 0437 09/05/20 0349 09/07/20 0528  AST 30 26  --   ALT 21 20  --   ALKPHOS 223* 204*  --   BILITOT 0.5 0.5  --   PROT 7.2 6.9  --   ALBUMIN 1.7* 1.7* 1.7*   No results for input(s): LIPASE, AMYLASE in the last 168 hours. No results for input(s): AMMONIA in the last 168 hours. CBC: Recent Labs  Lab 09/02/20 0347 09/03/20 1855 09/04/20 0437 09/05/20 0349 09/06/20 0945 09/07/20 0528  WBC 24.5*  --  23.5* 22.1* 39.0* 28.3*  NEUTROABS  --   --  17.9*  --   --   --   HGB 8.7* 9.4* 8.8* 8.6* 9.9* 8.5*  HCT 26.9* 29.1* 28.2* 27.0* 31.7* 26.6*  MCV 82.0  --  82.5 82.3 82.6 81.6  PLT 463*  --  479* 445* 458* 445*   Cardiac Enzymes: No results for input(s): CKTOTAL, CKMB,  CKMBINDEX, TROPONINI in the last 168 hours. BNP (last 3 results) Recent Labs    06/29/2020 1922  BNP 135.0*    ProBNP (last 3 results) No results for input(s): PROBNP in the last 8760 hours.  CBG: Recent Labs  Lab 09/06/20 1153 09/06/20 1525 09/06/20 1931 09/07/20 0010 09/07/20 0407  GLUCAP 212* 200* 162* 156* 182*       Studies: DG Abd Portable 1V  Result Date: 09/05/2020 CLINICAL DATA:  Feeding tube placement EXAM: PORTABLE ABDOMEN - 1 VIEW COMPARISON:  09/01/2020 FINDINGS: Feeding tube is transpyloric with the tip in the distal duodenum. Nonobstructive bowel gas pattern. IMPRESSION: Feeding tube tip in the distal duodenum. Electronically Signed   By: Rolm Baptise M.D.   On: 09/05/2020 17:30   VAS [place] UPPER EXT VEIN MAPPING (PRE-OP AVF)  Result Date: 09/05/2020 UPPER EXTREMITY VEIN MAPPING Patient Name:  Luis Orr  Date of Exam:   09/05/2020 Medical Rec #: [id number]          Accession #:    [id number] Date of Birth: 1950/08/24         Patient Gender: M Patient Age:   77 years Exam Location:  [facility] Procedure:      VAS [place] UPPER EXT VEIN MAPPING (PRE-OP AVF) Referring Phys: Karoline Caldwell --------------------------------------------------------------------------------  Indications: Pre-access. Comparison Study: No previous Performing Technologist: Jody Hill RVT, RDMS  Examination Guidelines: A complete evaluation includes B-mode imaging, spectral Doppler, color Doppler, and power Doppler as needed of all accessible portions of each vessel. Bilateral testing is considered an integral part of a complete examination. Limited examinations for reoccurring indications may be performed as noted. +-----------------+-------------+----------+--------------+ Right Cephalic   Diameter (cm)Depth (cm)   Findings    +-----------------+-------------+----------+--------------+ Shoulder             0.12        0.72                   +-----------------+-------------+----------+--------------+ Prox upper arm       0.18        0.57                  +-----------------+-------------+----------+--------------+ Mid upper arm        0.14        0.92                  +-----------------+-------------+----------+--------------+ Dist upper arm       0.11        0.89                  +-----------------+-------------+----------+--------------+  Antecubital fossa    0.17        0.52      Thrombus    +-----------------+-------------+----------+--------------+ Prox forearm         0.10        0.74                  +-----------------+-------------+----------+--------------+ Mid forearm          0.11        0.80     branching    +-----------------+-------------+----------+--------------+ Dist forearm         0.11        0.66                  +-----------------+-------------+----------+--------------+ Wrist                                   not visualized +-----------------+-------------+----------+--------------+ +-----------------+-------------+----------+--------------+ Right Basilic    Diameter (cm)Depth (cm)   Findings    +-----------------+-------------+----------+--------------+ Shoulder                                not visualized +-----------------+-------------+----------+--------------+ Prox upper arm                          not visualized +-----------------+-------------+----------+--------------+ Mid upper arm                           not visualized +-----------------+-------------+----------+--------------+ Dist upper arm                          not visualized +-----------------+-------------+----------+--------------+ Antecubital fossa                       not visualized +-----------------+-------------+----------+--------------+ Prox forearm                            not visualized +-----------------+-------------+----------+--------------+ Mid forearm                              not visualized +-----------------+-------------+----------+--------------+ Distal forearm                          not visualized +-----------------+-------------+----------+--------------+ Wrist                                   not visualized +-----------------+-------------+----------+--------------+ +-----------------+-------------+----------+--------------+ Left Cephalic    Diameter (cm)Depth (cm)   Findings    +-----------------+-------------+----------+--------------+ Shoulder             0.14        0.62                  +-----------------+-------------+----------+--------------+ Prox upper arm       0.19        0.65                  +-----------------+-------------+----------+--------------+ Mid upper arm        0.11        0.76   not visualized +-----------------+-------------+----------+--------------+ Dist upper arm       0.12  0.46                  +-----------------+-------------+----------+--------------+ Antecubital fossa    0.15        0.30   not visualized +-----------------+-------------+----------+--------------+ Prox forearm         0.14        0.34                  +-----------------+-------------+----------+--------------+ Mid forearm          0.12        0.50                  +-----------------+-------------+----------+--------------+ Dist forearm                            not visualized +-----------------+-------------+----------+--------------+ Wrist                                   not visualized +-----------------+-------------+----------+--------------+ +-----------------+-------------+----------+--------------+ Left Basilic     Diameter (cm)Depth (cm)   Findings    +-----------------+-------------+----------+--------------+ Prox upper arm       0.30                              +-----------------+-------------+----------+--------------+ Mid upper arm        0.26                               +-----------------+-------------+----------+--------------+ Dist upper arm       0.19                              +-----------------+-------------+----------+--------------+ Antecubital fossa    0.20                              +-----------------+-------------+----------+--------------+ Prox forearm         0.14                              +-----------------+-------------+----------+--------------+ Mid forearm          0.08                              +-----------------+-------------+----------+--------------+ Distal forearm                          not visualized +-----------------+-------------+----------+--------------+ Wrist                                   not visualized +-----------------+-------------+----------+--------------+ *See table(s) above for measurements and observations.  Diagnosing physician: Coral Else MD Electronically signed by Coral Else MD on 09/05/2020 at 8:13:16 PM.    Final     Scheduled Meds:  amiodarone  200 mg Per Tube Daily   buPROPion  75 mg Per Tube BID   chlorhexidine gluconate (MEDLINE KIT)  15 mL Mouth Rinse BID   [START ON 09/08/2020] Chlorhexidine Gluconate Cloth  6 each Topical Daily   [START ON 09/08/2020] Chlorhexidine Gluconate Cloth  6 each Topical Daily  darbepoetin (ARANESP) injection - DIALYSIS  40 mcg Intravenous Q Mon-HD   feeding supplement (PROSource TF)  45 mL Per Tube TID   guaiFENesin  5 mL Per Tube BID   insulin aspart  0-9 Units Subcutaneous Q4H   insulin glargine-yfgn  25 Units Subcutaneous Daily   lidocaine  1 patch Transdermal Q24H   mouth rinse  15 mL Mouth Rinse 10 times per day   metaxalone  800 mg Per Tube TID   midodrine  5 mg Per Tube Q8H   multivitamin  1 tablet Per Tube QHS   pantoprazole sodium  40 mg Per Tube QHS   scopolamine  1 patch Transdermal Q72H   sertraline  100 mg Per Tube Daily   sodium bicarbonate  650 mg Per Tube Once   sodium chloride flush  10-40 mL Intracatheter Q12H    Continuous Infusions:  sodium chloride Stopped (07/26/20 0824)   sodium chloride 500 mL (09/01/20 1408)   sodium chloride     sodium chloride     sodium chloride     feeding supplement (NEPRO CARB STEADY) 1,000 mL (09/06/20 2141)   heparin 1,200 Units/hr (09/07/20 0700)    Active Problems:   Septic shock (HCC)   Pressure injury of skin   Acute respiratory failure with hypoxia (HCC)   AKI (acute kidney injury) ([place])   Hypotension   Goals of care, counseling/discussion   Quadriplegia ([place])   Chronic kidney disease requiring chronic dialysis ([place])   Hyperglycemia   Sepsis due to Acinetobacter baumannii ([place])   Pneumonia due to Acinetobacter species ([place])   Dysautonomia ([place])   Chronic midline thoracic back pain   Consultants: PCCM Nephrology Infectious disease Palliative care medicine  Procedures: Echocardiogram Insertion of double-lumen HD catheter  Antibiotics: Cefepime x1 dose Meropenem 6/29 through 7/2 Vancomycin 6/29 through 6/30 Unasyn 7/2 through 7/15 Unasyn 8/15 >   Time spent: 35 minutes    Erin Hearing ANP  [facility] 7 am - 330 pm/M-F for direct patient care and secure chat Please refer to Amion for contact info 58  days

## 2020-09-07 NOTE — Progress Notes (Signed)
No charge Progress Note  Patient sound asleep.  Did not wake up to call.  Did not attempt to further wake him up.  Appears comfortable and in no distress.  Remains on ventilator.  Vital signs stable.  Discussed with RN in the unit, no acute issues overnight, no further bleeding issues overnight.  Discussed with pharmacy regarding IV heparin, continue through vascular procedure next week and then consider transitioning to Eliquis.  Discussed with Ms. Erin Hearing, NP.  Vernell Leep, MD, [place], [facility]. [facility]  To contact the attending provider between 7A-7P or the covering provider during after hours 7P-7A, please log into the web site [email or web address] and access using universal [place] password for that web site. If you do not have the password, please call the hospital operator.

## 2020-09-07 NOTE — Progress Notes (Addendum)
[place] for IV Heparin Indication: atrial fibrillation while apixaban on hold  Allergies  Allergen Reactions   Adhesive [Tape] Rash    Patient Measurements: Height: '5\' 6"'$  (167.6 cm) Weight: 77 kg (169 lb 12.1 oz) IBW/kg (Calculated) : 63.8 Heparin Dosing Weight: 74.6 kg   Vital Signs: Temp: 98.8 F (37.1 C) (08/26 1156) Temp Source: Axillary (08/26 1156) BP: 143/80 (08/26 1700) Pulse Rate: 85 (08/26 1700)  Labs: Recent Labs    09/05/20 0349 09/06/20 0945 09/06/20 1830 09/07/20 0528 09/07/20 1600  HGB 8.6* 9.9*  --  8.5*  --   HCT 27.0* 31.7*  --  26.6*  --   PLT 445* 458*  --  445*  --   APTT  --   --  53* 55* 61*  HEPARINUNFRC  --   --  >1.10* 0.76*  --   CREATININE 1.51*  --   --  1.18  --     Estimated Creatinine Clearance: 56.9 mL/min (by C-G formula based on SCr of 1.18 mg/dL).  Medical History: Past Medical History:  Diagnosis Date   Anemia    Anxiety    Chronic respiratory failure (HCC)    DM2 (diabetes mellitus, type 2) (HCC)    Functional quadriplegia (HCC)    Hypertension    Multiple drug resistant organism (MDRO) culture positive    Urine retention    UTI (urinary tract infection)     Assessment: 70 yr old male admitted 07/03/2020 for AMS, hypotension in setting of recurrent pneumonia; he is on chronic vent secondary to quadriplegia. Pt also with ESRD, now on IHD. Pt was on apixaban PTA for Afib (last dose was on 8/24 at 1430). Pharmacy was consulted to dose IV heparin while apixaban on hold. Given recent apixaban exposure, will monitor anticoagulation using aPTT until aPTT and  heparin levels correlate.  aPTT ~8.5 hrs after heparin infusion was increased to 1350 units/hr was 61 sec, which is below the goal range for this pt. Hgb 8.5. Plt elevated at 445. Pt still biting lip, per RN, causing small amt of bleeding from oral cavity/trach site; no other signs of bleeding. Per RN, no issues with IV noted. Per [facility]  note, plan to continue IV heparin through vascular procedure next week (AV graft placement), then consider transitioning back to apixaban.  Goal of Therapy:  Heparin level 0.3-0.7 units/ml aPTT 66-102 seconds Monitor platelets by anticoagulation protocol: Yes   Plan:  Increase heparin to 1500 units/hr Check 8-hr aPTT Monitor aPTT, heparin level, CBC Monitor for bleeding  Gillermina Hu, PharmD, BCPS, [facility] Clinical Pharmacist 09/07/2020 5:33 PM

## 2020-09-07 NOTE — Progress Notes (Signed)
[place] for Heparin Indication: atrial fibrillation while apixaban on hold  Allergies  Allergen Reactions   Adhesive [Tape] Rash    Patient Measurements: Height: '5\' 6"'$  (167.6 cm) Weight: 77 kg (169 lb 12.1 oz) IBW/kg (Calculated) : 63.8 Heparin Dosing Weight: 74.6 kg   Vital Signs: Temp: 98.9 F (37.2 C) (08/26 0409) Temp Source: Oral (08/26 0409) BP: 123/74 (08/26 0700) Pulse Rate: 88 (08/26 0700)  Labs: Recent Labs    09/05/20 0349 09/06/20 0945 09/06/20 1830 09/07/20 0528  HGB 8.6* 9.9*  --  8.5*  HCT 27.0* 31.7*  --  26.6*  PLT 445* 458*  --  445*  APTT  --   --  53* 55*  HEPARINUNFRC  --   --  >1.10* 0.76*  CREATININE 1.51*  --   --  1.18     Estimated Creatinine Clearance: 56.9 mL/min (by C-G formula based on SCr of 1.18 mg/dL).   Medical History: Past Medical History:  Diagnosis Date   Anemia    Anxiety    Chronic respiratory failure (HCC)    DM2 (diabetes mellitus, type 2) (HCC)    Functional quadriplegia (HCC)    Hypertension    Multiple drug resistant organism (MDRO) culture positive    Urine retention    UTI (urinary tract infection)     Assessment: 70 yo male admitted 07/08/2020 on apixaban for Afib. Pharmacy consulted to dose heparin with oral and peri tracheal bleeding. Minimal bleeding in mouth and around trach site - per RN possibly from patient biting lip. No other bleeding noted. Last dose apixaban 8/24 at 1430 (doses on 8/24 PM held per MD).   aPTT 55 on heparin 1200 units/hr is subtherapeutic. Heparin level still falsely elevated from previous apixaban. Bleeding from oral cavity/trach site resolved per RN. No other signs of bleeding. Hgb 8.5. Plt elevated.  Goal of Therapy:  Heparin level 0.3-0.7 units/ml aPTT 66-102 seconds Monitor platelets by anticoagulation protocol: Yes   Plan:  Increase heparin to 1350 units/hr Check 8 hr aPTT Monitor aPTT, heparin level, CBC and s/s of  bleeding Follow up correlation of aPTT and heparin level to be able to titrate by heparin level when correlating.   Cristela Felt, PharmD, BCPS Clinical Pharmacist 09/07/2020 7:21 AM

## 2020-09-08 DIAGNOSIS — G8250 Quadriplegia, unspecified: Secondary | ICD-10-CM

## 2020-09-08 LAB — CBC
HCT: 24.6 % — ABNORMAL LOW (ref 39.0–52.0)
Hemoglobin: 8 g/dL — ABNORMAL LOW (ref 13.0–17.0)
MCH: 26.1 pg (ref 26.0–34.0)
MCHC: 32.5 g/dL (ref 30.0–36.0)
MCV: 80.1 fL (ref 80.0–100.0)
Platelets: 462 10*3/uL — ABNORMAL HIGH (ref 150–400)
RBC: 3.07 MIL/uL — ABNORMAL LOW (ref 4.22–5.81)
RDW: 19.5 % — ABNORMAL HIGH (ref 11.5–15.5)
WBC: 17.9 10*3/uL — ABNORMAL HIGH (ref 4.0–10.5)
nRBC: 0.6 % — ABNORMAL HIGH (ref 0.0–0.2)

## 2020-09-08 LAB — GLUCOSE, CAPILLARY
Glucose-Capillary: 117 mg/dL — ABNORMAL HIGH (ref 70–99)
Glucose-Capillary: 117 mg/dL — ABNORMAL HIGH (ref 70–99)
Glucose-Capillary: 161 mg/dL — ABNORMAL HIGH (ref 70–99)
Glucose-Capillary: 203 mg/dL — ABNORMAL HIGH (ref 70–99)
Glucose-Capillary: 198 mg/dL — ABNORMAL HIGH (ref 70–99)
Glucose-Capillary: 172 mg/dL — ABNORMAL HIGH (ref 70–99)
Glucose-Capillary: 128 mg/dL — ABNORMAL HIGH (ref 70–99)

## 2020-09-08 LAB — HEPARIN LEVEL (UNFRACTIONATED): Heparin Unfractionated: 0.23 IU/mL — ABNORMAL LOW (ref 0.30–0.70)

## 2020-09-08 LAB — APTT
aPTT: 59 seconds — ABNORMAL HIGH (ref 24–36)
aPTT: 103 seconds — ABNORMAL HIGH (ref 24–36)

## 2020-09-08 MED ORDER — HEPARIN SODIUM (PORCINE) 1000 UNIT/ML IJ SOLN
INTRAMUSCULAR | Status: AC
  Administered 2020-09-08: 4000 [IU] via INTRAVENOUS
  Filled 2020-09-08: qty 12

## 2020-09-08 NOTE — Progress Notes (Signed)
[place] for IV Heparin Indication: atrial fibrillation while apixaban on hold  Allergies  Allergen Reactions   Adhesive [Tape] Rash    Patient Measurements: Height: '5\' 6"'$  (167.6 cm) Weight: 74.2 kg (163 lb 9.3 oz) IBW/kg (Calculated) : 63.8 Heparin Dosing Weight: 74.6 kg   Vital Signs: Temp: 97.7 F (36.5 C) (08/27 1145) Temp Source: Axillary (08/27 1145) BP: 145/84 (08/27 1545) Pulse Rate: 89 (08/27 1545)  Labs: Recent Labs    09/06/20 0945 09/06/20 0945 09/06/20 1830 09/07/20 0528 09/07/20 1600 09/08/20 0200 09/08/20 1400  HGB 9.9*  --   --  8.5*  --  8.0*  --   HCT 31.7*  --   --  26.6*  --  24.6*  --   PLT 458*  --   --  445*  --  462*  --   APTT  --    < > 53* 55* 61* 59* 103*  HEPARINUNFRC  --   --  >1.10* 0.76*  --  0.23*  --   CREATININE  --   --   --  1.18  --   --   --    < > = values in this interval not displayed.    Estimated Creatinine Clearance: 52.6 mL/min (by C-G formula based on SCr of 1.18 mg/dL).  Medical History: Past Medical History:  Diagnosis Date   Anemia    Anxiety    Chronic respiratory failure (HCC)    DM2 (diabetes mellitus, type 2) (HCC)    Functional quadriplegia (HCC)    Hypertension    Multiple drug resistant organism (MDRO) culture positive    Urine retention    UTI (urinary tract infection)     Assessment: 70 yr old male admitted 06/17/2020 for AMS, hypotension in setting of recurrent pneumonia; he is on chronic vent secondary to quadriplegia. Pt also with ESRD, now on IHD. Pt was on apixaban PTA for Afib (last dose was on 8/24 at 1430). Pharmacy was consulted to dose IV heparin while apixaban on hold. Given recent apixaban exposure, will monitor anticoagulation using aPTT until aPTT and  heparin levels correlate.  aPTT is now slightly above goal at 103 seconds.  Goal of Therapy:  Heparin level 0.3-0.7 units/ml aPTT 66-102 seconds Monitor platelets by anticoagulation protocol:  Yes   Plan:  Reduce heparin to 1600 units/h Recheck aPTT and heparin level with am labs  Luis Orr, PharmD, BCPS, [facility] Clinical Pharmacist [phone] Please check AMION for all [place] numbers 09/08/2020

## 2020-09-08 NOTE — Progress Notes (Signed)
[facility] NEPHROLOGY PROGRESS NOTE  Assessment/ Plan:  # Prolonged dialysis dependent AKI, now ESRD:  renal replacement therapy started 7/6; Initially on CRRT and now tolerating iHD Can likely change to THS schedule: 3h, 4K, 2-3L UF, HD cath, no heparin Using [facility] but AVF/G req by SNF, AVG planned for next week with VVS  # Septic shock/ventilator associated pneumonia due to recurrent Acinetobacter infection: Hemodynamically stable off pressors.  Off ABX, per TRH/CCM  #  Chronic respiratory failure: Status post tracheostomy with ventilator management per CCM.  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  # CKD-BMD: Ca and P stable, off binders. CTM  # Anemia: contiue ESA, monitor Hb.   # Acute metabolic encephalopathy: Continue management as above.,  Supportive care.  #Mild hypokalemia: CTM  Subjective:  No interval events HD delayed to today  Objective Vital signs in last 24 hours: Vitals:   09/08/20 0900 09/08/20 0930 09/08/20 1000 09/08/20 1030  BP: 113/72 110/71 120/85 129/80  Pulse: 80 85 96 (!) 108  Resp: 20 20 18 20   Temp:      TempSrc:      SpO2: 100% 100% 98% 96%  Weight:      Height:       Weight change: 3 kg  Intake/Output Summary (Last 24 hours) at 09/08/2020 1101 Last data filed at 09/08/2020 1057 Gross per 24 hour  Intake 809.96 ml  Output 550 ml  Net 259.96 ml        Labs: Basic Metabolic Panel: Recent Labs  Lab 09/04/20 0437 09/05/20 0349 09/07/20 0528  NA 134* 133* 130*  K 4.4 4.4 3.2*  CL 97* 96* 88*  CO2 26 24 24   GLUCOSE 104* 145* 163*  BUN 47* 68* 56*  CREATININE 1.15 1.51* 1.18  CALCIUM 8.7* 8.8* 9.3  PHOS  --  5.4* 4.4    Liver Function Tests: Recent Labs  Lab 09/04/20 0437 09/05/20 0349 09/07/20 0528  AST 30 26  --   ALT 21 20  --   ALKPHOS 223* 204*  --   BILITOT 0.5 0.5   --   PROT 7.2 6.9  --   ALBUMIN 1.7* 1.7* 1.7*    No results for input(s): LIPASE, AMYLASE in the last 168 hours. No results for input(s): AMMONIA in the last 168 hours. CBC: Recent Labs  Lab 09/04/20 0437 09/05/20 0349 09/06/20 0945 09/07/20 0528 09/08/20 0200  WBC 23.5* 22.1* 39.0* 28.3* 17.9*  NEUTROABS 17.9*  --   --   --   --   HGB 8.8* 8.6* 9.9* 8.5* 8.0*  HCT 28.2* 27.0* 31.7* 26.6* 24.6*  MCV 82.5 82.3 82.6 81.6 80.1  PLT 479* 445* 458* 445* 462*    Cardiac Enzymes: No results for input(s): CKTOTAL, CKMB, CKMBINDEX, TROPONINI in the last 168 hours. CBG: Recent Labs  Lab 09/07/20 1509 09/07/20 1932 09/07/20 2327 09/08/20 0328 09/08/20 0758  GLUCAP 138* 140* 126* 117* 117*     Iron Studies: No results for input(s): IRON, TIBC, TRANSFERRIN, FERRITIN in the last 72 hours. Studies/Results: IR REPLACE G-TUBE SIMPLE WO FLUORO  Result Date: 09/07/2020 INDICATION: Patient with gastrostomy tube that is chronically clogged. Interventional radiology asked to exchange gastrostomy tube. EXAM: Gastrostomy tube exchange MEDICATIONS: None ANESTHESIA/SEDATION: None CONTRAST:  None FLUOROSCOPY TIME:  None COMPLICATIONS: None immediate. PROCEDURE: Patient seen at the bedside in the ICU for gastrostomy tube exchange. Current  gastrostomy tube is a 50 [place] balloon retention. Balloon was deflated and tube easily removed. New 20 French balloon retention gastrostomy tube replaced. X-ray has been ordered to confirm placement. IMPRESSION: Technically successful gastrostomy tube exchange with pending x-ray to confirm placement. Read by: Soyla Dryer, NP Electronically Signed   By: Aletta Edouard M.D.   On: 09/07/2020 15:56   DG ABDOMEN PEG TUBE LOCATION  Result Date: 09/07/2020 CLINICAL DATA:  G-tube placement. EXAM: ABDOMEN - 1 VIEW COMPARISON:  Abdominal radiograph 09/05/2020 FINDINGS: Enteric tube tip overlies the expected location of third portion of the duodenum. Percutaneous  gastrostomy tube overlies the distal gastric body. Oral contrast is seen within the gastric fundus and antrum. Scattered air in the stomach, small bowel, and colon with a nonobstructive bowel gas pattern. Right ureteral stent is partially visualized. IMPRESSION: Percutaneous gastrostomy tube overlies the distal gastric body. Oral contrast is seen within the gastric fundus and antrum. Remainder of exam is stable. Electronically Signed   By: Ileana Roup M.D.   On: 09/07/2020 17:54    Medications: Infusions:  sodium chloride Stopped (07/26/20 0824)   sodium chloride 500 mL (09/01/20 1408)   sodium chloride     sodium chloride     sodium chloride     feeding supplement (NEPRO CARB STEADY) 1,000 mL (09/06/20 2141)   heparin 1,650 Units/hr (09/08/20 0616)    Scheduled Medications:  amiodarone  200 mg Per Tube Daily   buPROPion  75 mg Per Tube BID   chlorhexidine gluconate (MEDLINE KIT)  15 mL Mouth Rinse BID   Chlorhexidine Gluconate Cloth  6 each Topical Daily   Chlorhexidine Gluconate Cloth  6 each Topical Daily   darbepoetin (ARANESP) injection - DIALYSIS  40 mcg Intravenous Q Mon-HD   feeding supplement (PROSource TF)  45 mL Per Tube TID   guaiFENesin  5 mL Per Tube BID   heparin sodium (porcine)       insulin aspart  0-9 Units Subcutaneous Q4H   insulin glargine-yfgn  25 Units Subcutaneous Daily   lidocaine  1 patch Transdermal Q24H   mouth rinse  15 mL Mouth Rinse 10 times per day   metaxalone  800 mg Per Tube TID   midodrine  5 mg Per Tube TID   multivitamin  1 tablet Per Tube QHS   pantoprazole sodium  40 mg Per Tube QHS   scopolamine  1 patch Transdermal Q72H   sertraline  100 mg Per Tube Daily   sodium bicarbonate  650 mg Per Tube Once   sodium chloride flush  10-40 mL Intracatheter Q12H    have reviewed scheduled and prn medications.  Physical Exam: General:NAD, resting with tracheostomy on vent.  Heart:RRR, s1s2 nl Lungs: Coarse breath sounds bilaterally, no  wheezing Abdomen:soft, Non-tender, PEG tube and ostomy bag Extremities:Trace edema Dialysis Access: Right IJ TDC  Imagene Boss B Lanessa Shill 09/08/2020,11:01 AM  LOS: 59 days

## 2020-09-08 NOTE — Progress Notes (Signed)
ANTICOAGULATION CONSULT NOTE Pharmacy Consult for Heparin Indication: atrial fibrillation  Allergies  Allergen Reactions   Adhesive [Tape] Rash    Patient Measurements: Height: '5\' 6"'$  (167.6 cm) Weight: 77.6 kg (171 lb 1.2 oz) IBW/kg (Calculated) : 63.8 Heparin Dosing Weight: 74.6 kg   Vital Signs: Temp: 97.9 F (36.6 C) (08/27 0335) Temp Source: Oral (08/27 0335) BP: 138/77 (08/27 0500) Pulse Rate: 86 (08/27 0351)  Labs: Recent Labs    09/06/20 0945 09/06/20 0945 09/06/20 1830 09/07/20 0528 09/07/20 1600 09/08/20 0200  HGB 9.9*  --   --  8.5*  --  8.0*  HCT 31.7*  --   --  26.6*  --  24.6*  PLT 458*  --   --  445*  --  462*  APTT  --    < > 53* 55* 61* 59*  HEPARINUNFRC  --   --  >1.10* 0.76*  --   --   CREATININE  --   --   --  1.18  --   --    < > = values in this interval not displayed.    Estimated Creatinine Clearance: 57.1 mL/min (by C-G formula based on SCr of 1.18 mg/dL).   Assessment: 70 y.o. male with h/o Afib, Eliquis on hold, for heparin  Goal of Therapy:  Heparin level 0.3-0.7 units/ml aPTT 66-102 seconds Monitor platelets by anticoagulation protocol: Yes   Plan:  Increase Heparin 1650 units/hr aPTT in 8 hours  Phillis Knack, PharmD, BCPS  09/08/2020 5:58 AM

## 2020-09-08 NOTE — Progress Notes (Signed)
Patient was seen and examined.  He was sleeping.  I did not wake him up. Long length of stay patient.  See detailed notes by advanced practice provider 8/26. Advanced practice provider and social worker diligently working on [place] for placement with ventilator management and dialysis accessibility.  No change in status today.  ESRD: On dialysis.  Graft planned for next week with vascular surgery.  Remains on heparin.  Septic shock: Improved.  Ventilated associated pneumonia due to recurrent Acinetobacter infection: Completed antibiotic therapy.  Chronic respiratory failure: Ventilator dependent.  Managed by PCCM.  Currently remains on vent.  Quadriplegia due to C3 C5 compressive myelopathy: Symptomatic treatment.  Plan: Continuing all supportive therapies.  Total time spent: 15 minutes.

## 2020-09-09 ENCOUNTER — Inpatient Hospital Stay (HOSPITAL_COMMUNITY): Payer: 59

## 2020-09-09 DIAGNOSIS — R4182 Altered mental status, unspecified: Secondary | ICD-10-CM | POA: Diagnosis not present

## 2020-09-09 DIAGNOSIS — I959 Hypotension, unspecified: Secondary | ICD-10-CM | POA: Diagnosis not present

## 2020-09-09 LAB — GLUCOSE, CAPILLARY
Glucose-Capillary: 121 mg/dL — ABNORMAL HIGH (ref 70–99)
Glucose-Capillary: 121 mg/dL — ABNORMAL HIGH (ref 70–99)
Glucose-Capillary: 138 mg/dL — ABNORMAL HIGH (ref 70–99)
Glucose-Capillary: 127 mg/dL — ABNORMAL HIGH (ref 70–99)
Glucose-Capillary: 122 mg/dL — ABNORMAL HIGH (ref 70–99)
Glucose-Capillary: 111 mg/dL — ABNORMAL HIGH (ref 70–99)

## 2020-09-09 LAB — CBC
HCT: 26.6 % — ABNORMAL LOW (ref 39.0–52.0)
Hemoglobin: 8.4 g/dL — ABNORMAL LOW (ref 13.0–17.0)
MCH: 25.7 pg — ABNORMAL LOW (ref 26.0–34.0)
MCHC: 31.6 g/dL (ref 30.0–36.0)
MCV: 81.3 fL (ref 80.0–100.0)
Platelets: 498 10*3/uL — ABNORMAL HIGH (ref 150–400)
RBC: 3.27 MIL/uL — ABNORMAL LOW (ref 4.22–5.81)
RDW: 19.2 % — ABNORMAL HIGH (ref 11.5–15.5)
WBC: 16.7 10*3/uL — ABNORMAL HIGH (ref 4.0–10.5)
nRBC: 0.4 % — ABNORMAL HIGH (ref 0.0–0.2)

## 2020-09-09 LAB — HEPARIN LEVEL (UNFRACTIONATED)
Heparin Unfractionated: 0.25 IU/mL — ABNORMAL LOW (ref 0.30–0.70)
Heparin Unfractionated: 0.24 IU/mL — ABNORMAL LOW (ref 0.30–0.70)

## 2020-09-09 LAB — APTT: aPTT: 58 seconds — ABNORMAL HIGH (ref 24–36)

## 2020-09-09 MED ORDER — CEFUROXIME SODIUM 1.5 G IV SOLR
1.5000 g | INTRAVENOUS | Status: AC
Start: 2020-09-10 — End: 2020-09-11

## 2020-09-09 MED ORDER — CHLORHEXIDINE GLUCONATE 0.12 % MT SOLN
OROMUCOSAL | Status: AC
  Filled 2020-09-09: qty 15

## 2020-09-09 MED ORDER — FENTANYL CITRATE (PF) 100 MCG/2ML IJ SOLN
25.0000 ug | INTRAMUSCULAR | Status: DC | PRN
  Administered 2020-09-11 – 2020-09-23 (×5): 25 ug via INTRAVENOUS
  Filled 2020-09-09 (×5): qty 2

## 2020-09-09 NOTE — Progress Notes (Signed)
S: 70 y/o M Cervical myelopathy +quad on Chr Vent/Trach, Afib, Dm ty ii, pressure ulcers stg iv on admit Admit to ICU 6/29 Multifocal PNA/septic shock acidosis and AKI Complicated hospitalization with recurrent infections [?VAP]Acinetobacter/staph epidermidis--last finished Rx 8/22 Continues to get tube feeds Kidney function never improved, now declared ESRD and for graft 09/11/2020 under Dr. Stanford Breed Awaits Trach SNF in [place]  O: BP (!) 142/75   Pulse 90   Temp 98.2 F (36.8 C) (Oral)   Resp 20   Ht '5\' 6"'$  (1.676 m)   Wt 74.4 kg   SpO2 100%   BMI 26.47 kg/m  Vent Mode: PRVC FiO2 (%):  [40 %] 40 % Set Rate:  [20 bmp] 20 bmp Vt Set:  [510 mL] 510 mL PEEP:  [5 cmH20] 5 cmH20 Plateau Pressure:  [29 cmH20-35 cmH20] 29 cmH20  Comfortable on vent--moves eyes in response to questions Cta b but limited exam-has scopolamine patch on neck L [place] line in place-C[place]rak in place infusing feed 45 cc/h Slightly edematous S1 s2 nsr --monitors show predominant NSR Abd obese nt nd -PEG in place-locked off Diverting colostomy full of stool Cannot lift arms /legs 2/2 plegia   P ID Severe shock-resolved-Procal up but ID has addressed last week--no more ABx--Get CXR in am At risk for VAP Cefuroxime in am per-op  Resp Wean fentanyl to  25 q4prn to increase alertness  Cards Afib since 09/2019--Cont amio 200 qd, maintain on midrodrime for BP support Continue heparin gtt  Fen Needs change of core-track to PEG--Peg replaced apparently 8/26 but non-functioning? To be addressed in am  Renal Now HD dependant Will need access placed 8/29-defer to renal Check phos/lytes in am  Neuro Devastiating injury and Vent dependant since 2019? Family requests all supportive care--palliative has seen and discussed with them--Ethics is involved as well  No family 35 min  Verneita Griffes, MD [facility] 6:15 PM

## 2020-09-09 NOTE — Progress Notes (Signed)
Video connection link was texted to participant via mobile number provided. Video connection was successful.

## 2020-09-09 NOTE — Progress Notes (Signed)
[facility]  Subjective  -remains in ICU on trach.   Objective 131/72 85 99.3 F (37.4 C) (Oral) 20 100%  Intake/Output Summary (Last 24 hours) at 09/09/2020 0828 Last data filed at 09/09/2020 0400 Gross per 24 hour  Intake 1098.73 ml  Output 3000 ml  Net -1901.27 ml    Palpable radial pulses bilaterally  Laboratory Lab Results: Recent Labs    09/08/20 0200 09/09/20 0417  WBC 17.9* 16.7*  HGB 8.0* 8.4*  HCT 24.6* 26.6*  PLT 462* 498*   BMET Recent Labs    09/07/20 0528  NA 130*  K 3.2*  CL 88*  CO2 24  GLUCOSE 163*  BUN 56*  CREATININE 1.18  CALCIUM 9.3    COAG Lab Results  Component Value Date   INR 1.9 (H) 07/25/2020   INR 1.5 (H) 07/24/2020   INR 1.5 (H) 07/23/2020   No results found for: PTT  Assessment/Planning:  70 year old male that vascular surgery has been consulted for permanent dialysis access.  Discussed plan for left arm AV fistula versus graft tomorrow with Dr. Stanford Breed.  Please continue to hold DOAC with heparin bridge.  N.p.o. after midnight we will need to hold his tube feeds.  Vein mapping suggest likely AV graft with no good surface veins.  Consent order placed in chart.  Marty Heck 09/09/2020 8:28 AM --

## 2020-09-09 NOTE — Progress Notes (Signed)
[facility] NEPHROLOGY PROGRESS NOTE  Assessment/ Plan:  # Prolonged dialysis dependent AKI, now ESRD:  renal replacement therapy started 7/6; Initially on CRRT and now tolerating iHD Move fwd on THS schedule: 3h, 4K, 2-3L UF, HD cath, no heparin Using [facility] but AVF/G req by out of state SNF, AVG planned for Monday with VVS  # Septic shock/ventilator associated pneumonia due to recurrent Acinetobacter infection: Hemodynamically stable off pressors.  Off ABX, per TRH/CCM  #  Chronic respiratory failure: Status post tracheostomy with ventilator management per CCM.  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  For out of state SNF that can care for Trach/HD patients  # CKD-BMD: Ca and P stable, off binders. CTM  # Anemia: contiue ESA, monitor Hb.   # Acute metabolic encephalopathy: Continue management as above.,  Supportive care.  #Mild hypokalemia: CTM  Subjective:  No interval events HD yesterday 3L UF For AVG tomorrow  Objective Vital signs in last 24 hours: Vitals:   09/09/20 0700 09/09/20 0800 09/09/20 0858 09/09/20 0900  BP: (!) 115/54 128/78  (!) 142/79  Pulse:  89 90   Resp: 20 20 20 20   Temp: 99.3 F (37.4 C)     TempSrc: Oral     SpO2:  100% 100%   Weight:      Height:       Weight change: 0 kg  Intake/Output Summary (Last 24 hours) at 09/09/2020 1024 Last data filed at 09/09/2020 0900 Gross per 24 hour  Intake 1120.25 ml  Output 3000 ml  Net -1879.75 ml        Labs: Basic Metabolic Panel: Recent Labs  Lab 09/04/20 0437 09/05/20 0349 09/07/20 0528  NA 134* 133* 130*  K 4.4 4.4 3.2*  CL 97* 96* 88*  CO2 26 24 24   GLUCOSE 104* 145* 163*  BUN 47* 68* 56*  CREATININE 1.15 1.51* 1.18  CALCIUM 8.7* 8.8* 9.3  PHOS  --  5.4* 4.4    Liver Function Tests: Recent Labs  Lab 09/04/20 0437 09/05/20 0349  09/07/20 0528  AST 30 26  --   ALT 21 20  --   ALKPHOS 223* 204*  --   BILITOT 0.5 0.5  --   PROT 7.2 6.9  --   ALBUMIN 1.7* 1.7* 1.7*    No results for input(s): LIPASE, AMYLASE in the last 168 hours. No results for input(s): AMMONIA in the last 168 hours. CBC: Recent Labs  Lab 09/04/20 0437 09/05/20 0349 09/06/20 0945 09/07/20 0528 09/08/20 0200 09/09/20 0417  WBC 23.5* 22.1* 39.0* 28.3* 17.9* 16.7*  NEUTROABS 17.9*  --   --   --   --   --   HGB 8.8* 8.6* 9.9* 8.5* 8.0* 8.4*  HCT 28.2* 27.0* 31.7* 26.6* 24.6* 26.6*  MCV 82.5 82.3 82.6 81.6 80.1 81.3  PLT 479* 445* 458* 445* 462* 498*    Cardiac Enzymes: No results for input(s): CKTOTAL, CKMB, CKMBINDEX, TROPONINI in the last 168 hours. CBG: Recent Labs  Lab 09/08/20 1654 09/08/20 1931 09/08/20 2322 09/09/20 0318 09/09/20 0752  GLUCAP 198* 172* 128* 121* 121*     Iron Studies: No results for input(s): IRON, TIBC, TRANSFERRIN, FERRITIN in the last 72 hours. Studies/Results: IR REPLACE G-TUBE SIMPLE WO FLUORO  Result Date: 09/07/2020 INDICATION: Patient with gastrostomy tube that is chronically clogged. Interventional radiology asked to exchange gastrostomy tube. EXAM: Gastrostomy  tube exchange MEDICATIONS: None ANESTHESIA/SEDATION: None CONTRAST:  None FLUOROSCOPY TIME:  None COMPLICATIONS: None immediate. PROCEDURE: Patient seen at the bedside in the ICU for gastrostomy tube exchange. Current gastrostomy tube is a 20 [place] balloon retention. Balloon was deflated and tube easily removed. New 20 French balloon retention gastrostomy tube replaced. X-ray has been ordered to confirm placement. IMPRESSION: Technically successful gastrostomy tube exchange with pending x-ray to confirm placement. Read by: Soyla Dryer, NP Electronically Signed   By: Aletta Edouard M.D.   On: 09/07/2020 15:56   DG ABDOMEN PEG TUBE LOCATION  Result Date: 09/07/2020 CLINICAL DATA:  G-tube placement. EXAM: ABDOMEN - 1 VIEW COMPARISON:   Abdominal radiograph 09/05/2020 FINDINGS: Enteric tube tip overlies the expected location of third portion of the duodenum. Percutaneous gastrostomy tube overlies the distal gastric body. Oral contrast is seen within the gastric fundus and antrum. Scattered air in the stomach, small bowel, and colon with a nonobstructive bowel gas pattern. Right ureteral stent is partially visualized. IMPRESSION: Percutaneous gastrostomy tube overlies the distal gastric body. Oral contrast is seen within the gastric fundus and antrum. Remainder of exam is stable. Electronically Signed   By: Ileana Roup M.D.   On: 09/07/2020 17:54    Medications: Infusions:  sodium chloride Stopped (07/26/20 0824)   sodium chloride 500 mL (09/01/20 1408)   sodium chloride     sodium chloride     sodium chloride     [START ON 08/23/2020] cefUROXime (ZINACEF)  IV     feeding supplement (NEPRO CARB STEADY) 45 mL/hr at 09/09/20 0900   heparin 1,650 Units/hr (09/09/20 0900)    Scheduled Medications:  amiodarone  200 mg Per Tube Daily   buPROPion  75 mg Per Tube BID   chlorhexidine       chlorhexidine gluconate (MEDLINE KIT)  15 mL Mouth Rinse BID   Chlorhexidine Gluconate Cloth  6 each Topical Daily   darbepoetin (ARANESP) injection - DIALYSIS  40 mcg Intravenous Q Mon-HD   feeding supplement (PROSource TF)  45 mL Per Tube TID   guaiFENesin  5 mL Per Tube BID   insulin aspart  0-9 Units Subcutaneous Q4H   insulin glargine-yfgn  25 Units Subcutaneous Daily   lidocaine  1 patch Transdermal Q24H   mouth rinse  15 mL Mouth Rinse 10 times per day   metaxalone  800 mg Per Tube TID   midodrine  5 mg Per Tube TID   multivitamin  1 tablet Per Tube QHS   pantoprazole sodium  40 mg Per Tube QHS   scopolamine  1 patch Transdermal Q72H   sertraline  100 mg Per Tube Daily   sodium bicarbonate  650 mg Per Tube Once   sodium chloride flush  10-40 mL Intracatheter Q12H    have reviewed scheduled and prn medications.  Physical  Exam: General:NAD, resting with tracheostomy on vent.  Heart:RRR, s1s2 nl Lungs: Coarse breath sounds bilaterally, no wheezing Abdomen:soft, Non-tender, PEG tube and ostomy bag Extremities:Trace edema Dialysis Access: Right IJ TDC  Rocio Wolak B Zenda Herskowitz 09/09/2020,10:24 AM  LOS: 60 days

## 2020-09-09 NOTE — Progress Notes (Signed)
[place] for Heparin Indication: atrial fibrillation while apixaban on hold  Allergies  Allergen Reactions   Adhesive [Tape] Rash    Patient Measurements: Height: '5\' 6"'$  (167.6 cm) Weight: 74.4 kg (164 lb 0.4 oz) IBW/kg (Calculated) : 63.8 Heparin Dosing Weight: 74.6 kg   Vital Signs: Temp: 98.2 F (36.8 C) (08/28 1500) Temp Source: Oral (08/28 1500) BP: 142/75 (08/28 1600) Pulse Rate: 90 (08/28 1600)  Labs: Recent Labs    09/07/20 0528 09/07/20 1600 09/08/20 0200 09/08/20 1400 09/09/20 0417 09/09/20 1702  HGB 8.5*  --  8.0*  --  8.4*  --   HCT 26.6*  --  24.6*  --  26.6*  --   PLT 445*  --  462*  --  498*  --   APTT 55*   < > 59* 103* 58*  --   HEPARINUNFRC 0.76*  --  0.23*  --  0.25* 0.24*  CREATININE 1.18  --   --   --   --   --    < > = values in this interval not displayed.     Estimated Creatinine Clearance: 52.6 mL/min (by C-G formula based on SCr of 1.18 mg/dL).   Medical History: Past Medical History:  Diagnosis Date   Anemia    Anxiety    Chronic respiratory failure (HCC)    DM2 (diabetes mellitus, type 2) (HCC)    Functional quadriplegia (HCC)    Hypertension    Multiple drug resistant organism (MDRO) culture positive    Urine retention    UTI (urinary tract infection)     Assessment: 70 yo male admitted 07/02/2020 on apixaban for Afib. Pharmacy consulted to dose heparin with oral and peri tracheal bleeding. Minimal bleeding in mouth and around trach site - per RN possibly from patient biting lip. No other bleeding noted. Last dose apixaban 8/24 at 1430 (doses on 8/24 PM held per MD).   Repeat heparin level remains subtherapeutic.  Goal of Therapy:  Heparin level 0.3-0.7 units/ml aPTT 66-102 seconds Monitor platelets by anticoagulation protocol: Yes   Plan:  Increase heparin to 1700 units/h Recheck heparin level with daily labs  Arrie Senate, PharmD, BCPS, [facility] Clinical  Pharmacist [phone] Please check AMION for all [place] numbers 09/09/2020

## 2020-09-09 NOTE — Progress Notes (Signed)
[place] for Heparin Indication: atrial fibrillation while apixaban on hold  Allergies  Allergen Reactions   Adhesive [Tape] Rash    Patient Measurements: Height: '5\' 6"'$  (167.6 cm) Weight: 74.4 kg (164 lb 0.4 oz) IBW/kg (Calculated) : 63.8 Heparin Dosing Weight: 74.6 kg   Vital Signs: Temp: 99.3 F (37.4 C) (08/28 0700) Temp Source: Oral (08/28 0700) BP: 131/72 (08/28 0600) Pulse Rate: 85 (08/28 0600)  Labs: Recent Labs    09/07/20 0528 09/07/20 1600 09/08/20 0200 09/08/20 1400 09/09/20 0417  HGB 8.5*  --  8.0*  --  8.4*  HCT 26.6*  --  24.6*  --  26.6*  PLT 445*  --  462*  --  498*  APTT 55*   < > 59* 103* 58*  HEPARINUNFRC 0.76*  --  0.23*  --  0.25*  CREATININE 1.18  --   --   --   --    < > = values in this interval not displayed.     Estimated Creatinine Clearance: 52.6 mL/min (by C-G formula based on SCr of 1.18 mg/dL).   Medical History: Past Medical History:  Diagnosis Date   Anemia    Anxiety    Chronic respiratory failure (HCC)    DM2 (diabetes mellitus, type 2) (HCC)    Functional quadriplegia (HCC)    Hypertension    Multiple drug resistant organism (MDRO) culture positive    Urine retention    UTI (urinary tract infection)     Assessment: 70 yo male admitted 07/01/2020 on apixaban for Afib. Pharmacy consulted to dose heparin with oral and peri tracheal bleeding. Minimal bleeding in mouth and around trach site - per RN possibly from patient biting lip. No other bleeding noted. Last dose apixaban 8/24 at 1430 (doses on 8/24 PM held per MD).   aPTT 58 and heparin level 0.25 on heparin 1600 units/hr. Continued minimal slight pink tinge around trach per RN. No other bleeding noted. Hgb 8.4. Plt elevated.   Goal of Therapy:  Heparin level 0.3-0.7 units/ml aPTT 66-102 seconds Monitor platelets by anticoagulation protocol: Yes   Plan:  Increase heparin to 1650 units/hr Check 8 hr heparin level  Monitor  heparin level, CBC and s/s of bleeding daily  Stop monitoring aPTTs Continue to follow up on trach/oral bleeding  Follow up plans to resume oral anticoagulation after procedure  Cristela Felt, PharmD, BCPS Clinical Pharmacist 09/09/2020 8:26 AM

## 2020-09-10 ENCOUNTER — Encounter (HOSPITAL_COMMUNITY): Payer: Self-pay | Admitting: Internal Medicine

## 2020-09-10 ENCOUNTER — Inpatient Hospital Stay (HOSPITAL_COMMUNITY): Payer: 59 | Admitting: Certified Registered"

## 2020-09-10 ENCOUNTER — Encounter (HOSPITAL_COMMUNITY): Admission: EM | Disposition: E | Payer: Self-pay | Source: Home / Self Care | Attending: Internal Medicine

## 2020-09-10 DIAGNOSIS — N185 Chronic kidney disease, stage 5: Secondary | ICD-10-CM

## 2020-09-10 DIAGNOSIS — Z9911 Dependence on respirator [ventilator] status: Secondary | ICD-10-CM | POA: Diagnosis not present

## 2020-09-10 DIAGNOSIS — G8250 Quadriplegia, unspecified: Secondary | ICD-10-CM | POA: Diagnosis not present

## 2020-09-10 DIAGNOSIS — A419 Sepsis, unspecified organism: Secondary | ICD-10-CM | POA: Diagnosis not present

## 2020-09-10 DIAGNOSIS — A4159 Other Gram-negative sepsis: Secondary | ICD-10-CM | POA: Diagnosis not present

## 2020-09-10 DIAGNOSIS — J9621 Acute and chronic respiratory failure with hypoxia: Secondary | ICD-10-CM | POA: Diagnosis not present

## 2020-09-10 HISTORY — PX: AV FISTULA PLACEMENT: SHX1204

## 2020-09-10 LAB — GLUCOSE, CAPILLARY
Glucose-Capillary: 88 mg/dL (ref 70–99)
Glucose-Capillary: 70 mg/dL (ref 70–99)
Glucose-Capillary: 77 mg/dL (ref 70–99)
Glucose-Capillary: 81 mg/dL (ref 70–99)
Glucose-Capillary: 96 mg/dL (ref 70–99)
Glucose-Capillary: 121 mg/dL — ABNORMAL HIGH (ref 70–99)
Glucose-Capillary: 118 mg/dL — ABNORMAL HIGH (ref 70–99)

## 2020-09-10 LAB — RENAL FUNCTION PANEL
Albumin: 1.7 g/dL — ABNORMAL LOW (ref 3.5–5.0)
Anion gap: 13 (ref 5–15)
BUN: 72 mg/dL — ABNORMAL HIGH (ref 8–23)
CO2: 24 mmol/L (ref 22–32)
Calcium: 9.4 mg/dL (ref 8.9–10.3)
Chloride: 93 mmol/L — ABNORMAL LOW (ref 98–111)
Creatinine, Ser: 1.32 mg/dL — ABNORMAL HIGH (ref 0.61–1.24)
GFR, Estimated: 58 mL/min — ABNORMAL LOW (ref >60)
Glucose, Bld: 84 mg/dL (ref 70–99)
Phosphorus: 4.9 mg/dL — ABNORMAL HIGH (ref 2.5–4.6)
Potassium: 4 mmol/L (ref 3.5–5.1)
Sodium: 130 mmol/L — ABNORMAL LOW (ref 135–145)

## 2020-09-10 LAB — CBC WITH DIFFERENTIAL/PLATELET
Abs Immature Granulocytes: 0.38 10*3/uL — ABNORMAL HIGH (ref 0.00–0.07)
Basophils Absolute: 0.1 10*3/uL (ref 0.0–0.1)
Basophils Relative: 1 %
Eosinophils Absolute: 0.2 10*3/uL (ref 0.0–0.5)
Eosinophils Relative: 1 %
HCT: 25.7 % — ABNORMAL LOW (ref 39.0–52.0)
Hemoglobin: 8.3 g/dL — ABNORMAL LOW (ref 13.0–17.0)
Immature Granulocytes: 2 %
Lymphocytes Relative: 16 %
Lymphs Abs: 2.6 10*3/uL (ref 0.7–4.0)
MCH: 25.9 pg — ABNORMAL LOW (ref 26.0–34.0)
MCHC: 32.3 g/dL (ref 30.0–36.0)
MCV: 80.1 fL (ref 80.0–100.0)
Monocytes Absolute: 1.6 10*3/uL — ABNORMAL HIGH (ref 0.1–1.0)
Monocytes Relative: 10 %
Neutro Abs: 12 10*3/uL — ABNORMAL HIGH (ref 1.7–7.7)
Neutrophils Relative %: 70 %
Platelets: 451 10*3/uL — ABNORMAL HIGH (ref 150–400)
RBC: 3.21 MIL/uL — ABNORMAL LOW (ref 4.22–5.81)
RDW: 19.3 % — ABNORMAL HIGH (ref 11.5–15.5)
WBC: 16.9 10*3/uL — ABNORMAL HIGH (ref 4.0–10.5)
nRBC: 0.2 % (ref 0.0–0.2)

## 2020-09-10 LAB — HEPARIN LEVEL (UNFRACTIONATED): Heparin Unfractionated: 0.24 IU/mL — ABNORMAL LOW (ref 0.30–0.70)

## 2020-09-10 SURGERY — INSERTION OF ARTERIOVENOUS (AV) GORE-TEX GRAFT ARM
Anesthesia: General | Site: Arm Upper | Laterality: Left

## 2020-09-10 MED ORDER — HEMOSTATIC AGENTS (NO CHARGE) OPTIME
TOPICAL | Status: DC | PRN
  Administered 2020-09-10: 1 via TOPICAL

## 2020-09-10 MED ORDER — DARBEPOETIN ALFA 40 MCG/0.4ML IJ SOSY
40.0000 ug | PREFILLED_SYRINGE | INTRAMUSCULAR | Status: DC
  Administered 2020-09-11: 40 ug via INTRAVENOUS
  Filled 2020-09-10 (×2): qty 0.4

## 2020-09-10 MED ORDER — MIDAZOLAM HCL 5 MG/5ML IJ SOLN
INTRAMUSCULAR | Status: DC | PRN
  Administered 2020-09-10: 2 mg via INTRAVENOUS

## 2020-09-10 MED ORDER — MIDAZOLAM HCL 2 MG/2ML IJ SOLN: 0.5000 mg | Freq: Once | INTRAMUSCULAR | Status: DC | PRN

## 2020-09-10 MED ORDER — ONDANSETRON HCL 4 MG/2ML IJ SOLN
INTRAMUSCULAR | Status: AC
  Filled 2020-09-10: qty 2

## 2020-09-10 MED ORDER — 0.9 % SODIUM CHLORIDE (POUR BTL) OPTIME
TOPICAL | Status: DC | PRN
  Administered 2020-09-10: 1000 mL

## 2020-09-10 MED ORDER — EPHEDRINE 5 MG/ML INJ
INTRAVENOUS | Status: AC
  Filled 2020-09-10: qty 5

## 2020-09-10 MED ORDER — MIDAZOLAM HCL 2 MG/2ML IJ SOLN
INTRAMUSCULAR | Status: AC
  Filled 2020-09-10: qty 2

## 2020-09-10 MED ORDER — PROMETHAZINE HCL 25 MG/ML IJ SOLN: 6.2500 mg | INTRAMUSCULAR | Status: DC | PRN

## 2020-09-10 MED ORDER — HEPARIN 6000 UNIT IRRIGATION SOLUTION
Status: AC
  Filled 2020-09-10: qty 500

## 2020-09-10 MED ORDER — HEPARIN (PORCINE) 25000 UT/250ML-% IV SOLN
2250.0000 [IU]/h | INTRAVENOUS | Status: DC
  Administered 2020-09-10: 1800 [IU]/h via INTRAVENOUS
  Administered 2020-09-11: 2100 [IU]/h via INTRAVENOUS
  Administered 2020-09-11: 1950 [IU]/h via INTRAVENOUS
  Administered 2020-09-12 – 2020-09-13 (×3): 2100 [IU]/h via INTRAVENOUS
  Administered 2020-09-14: 2250 [IU]/h via INTRAVENOUS
  Administered 2020-09-14: 2100 [IU]/h via INTRAVENOUS
  Filled 2020-09-10 (×9): qty 250

## 2020-09-10 MED ORDER — FENTANYL CITRATE (PF) 100 MCG/2ML IJ SOLN: 25.0000 ug | INTRAMUSCULAR | Status: DC | PRN

## 2020-09-10 MED ORDER — PHENOL 1.4 % MT LIQD
1.0000 | OROMUCOSAL | Status: DC | PRN
  Filled 2020-09-10: qty 177

## 2020-09-10 MED ORDER — MEPERIDINE HCL 25 MG/ML IJ SOLN: 6.2500 mg | INTRAMUSCULAR | Status: DC | PRN

## 2020-09-10 MED ORDER — HEPARIN 6000 UNIT IRRIGATION SOLUTION
Status: DC | PRN
  Administered 2020-09-10: 1

## 2020-09-10 MED ORDER — PHENYLEPHRINE HCL-NACL 20-0.9 MG/250ML-% IV SOLN
INTRAVENOUS | Status: DC | PRN
Start: 2020-09-10 — End: 2020-09-10
  Administered 2020-09-10: 40 ug/min via INTRAVENOUS

## 2020-09-10 MED ORDER — OXYCODONE HCL 5 MG/5ML PO SOLN: 5.0000 mg | Freq: Once | ORAL | Status: DC | PRN

## 2020-09-10 MED ORDER — CEFAZOLIN SODIUM 1 G IJ SOLR
INTRAMUSCULAR | Status: AC
  Filled 2020-09-10: qty 20

## 2020-09-10 MED ORDER — OXYCODONE HCL 5 MG PO TABS: 5.0000 mg | ORAL_TABLET | Freq: Once | ORAL | Status: DC | PRN

## 2020-09-10 MED ORDER — CEFAZOLIN SODIUM-DEXTROSE 2-3 GM-%(50ML) IV SOLR
INTRAVENOUS | Status: DC | PRN
  Administered 2020-09-10: 2 g via INTRAVENOUS

## 2020-09-10 MED ORDER — PHENYLEPHRINE 40 MCG/ML (10ML) SYRINGE FOR IV PUSH (FOR BLOOD PRESSURE SUPPORT)
PREFILLED_SYRINGE | INTRAVENOUS | Status: AC
  Filled 2020-09-10: qty 10

## 2020-09-10 MED ORDER — LIDOCAINE-EPINEPHRINE (PF) 1 %-1:200000 IJ SOLN
INTRAMUSCULAR | Status: AC
  Filled 2020-09-10: qty 30

## 2020-09-10 MED ORDER — ONDANSETRON HCL 4 MG/2ML IJ SOLN
INTRAMUSCULAR | Status: DC | PRN
  Administered 2020-09-10: 4 mg via INTRAVENOUS

## 2020-09-10 SURGICAL SUPPLY — 43 items
ADH SKN CLS APL DERMABOND .7 (GAUZE/BANDAGES/DRESSINGS) ×1
APL PRP STRL LF DISP 70% ISPRP (MISCELLANEOUS) ×1
APL SKNCLS STERI-STRIP NONHPOA (GAUZE/BANDAGES/DRESSINGS) ×1
ARMBAND PINK RESTRICT EXTREMIT (MISCELLANEOUS) ×4 IMPLANT
BENZOIN TINCTURE PRP APPL 2/3 (GAUZE/BANDAGES/DRESSINGS) ×2 IMPLANT
CANISTER SUCT 3000ML PPV (MISCELLANEOUS) ×2 IMPLANT
CANNULA VESSEL 3MM 2 BLNT TIP (CANNULA) ×2 IMPLANT
CHLORAPREP W/TINT 26 (MISCELLANEOUS) ×2 IMPLANT
CLIP TI WIDE RED SMALL 6 (CLIP) ×2 IMPLANT
CLIP VESOCCLUDE MED 6/CT (CLIP) ×2 IMPLANT
CLIP VESOCCLUDE SM WIDE 6/CT (CLIP) ×2 IMPLANT
DERMABOND ADVANCED (GAUZE/BANDAGES/DRESSINGS) ×1
DERMABOND ADVANCED .7 DNX12 (GAUZE/BANDAGES/DRESSINGS) ×1 IMPLANT
ELECT REM PT RETURN 9FT ADLT (ELECTROSURGICAL) ×2
ELECTRODE REM PT RTRN 9FT ADLT (ELECTROSURGICAL) ×1 IMPLANT
GAUZE 4X4 16PLY ~~LOC~~+RFID DBL (SPONGE) ×2 IMPLANT
GLOVE SURG ENC MOIS LTX SZ8 (GLOVE) ×2 IMPLANT
GOWN STRL REUS W/ TWL LRG LVL3 (GOWN DISPOSABLE) ×2 IMPLANT
GOWN STRL REUS W/ TWL XL LVL3 (GOWN DISPOSABLE) ×1 IMPLANT
GOWN STRL REUS W/TWL LRG LVL3 (GOWN DISPOSABLE) ×4
GOWN STRL REUS W/TWL XL LVL3 (GOWN DISPOSABLE) ×2
GRAFT GORETEX STRT 4-7X45 (Vascular Products) ×2 IMPLANT
HEMOSTAT SNOW SURGICEL 2X4 (HEMOSTASIS) ×2 IMPLANT
INSERT FOGARTY SM (MISCELLANEOUS) ×2 IMPLANT
KIT BASIN OR (CUSTOM PROCEDURE TRAY) ×2 IMPLANT
KIT TURNOVER KIT B (KITS) ×2 IMPLANT
NEEDLE 18GX1X1/2 (RX/OR ONLY) (NEEDLE) IMPLANT
NS IRRIG 1000ML POUR BTL (IV SOLUTION) ×2 IMPLANT
PACK CV ACCESS (CUSTOM PROCEDURE TRAY) ×2 IMPLANT
PAD ARMBOARD 7.5X6 YLW CONV (MISCELLANEOUS) ×4 IMPLANT
SPONGE T-LAP 18X18 ~~LOC~~+RFID (SPONGE) ×2 IMPLANT
STRIP CLOSURE SKIN 1/2X4 (GAUZE/BANDAGES/DRESSINGS) ×2 IMPLANT
SUT GORETEX 6.0 TT9 (SUTURE) ×2 IMPLANT
SUT MNCRL AB 4-0 PS2 18 (SUTURE) IMPLANT
SUT PROLENE 6 0 BV (SUTURE) IMPLANT
SUT SILK 2 0 SH (SUTURE) IMPLANT
SUT VIC AB 3-0 SH 27 (SUTURE) ×4
SUT VIC AB 3-0 SH 27X BRD (SUTURE) ×2 IMPLANT
SYR 3ML LL SCALE MARK (SYRINGE) IMPLANT
SYR TOOMEY 50ML (SYRINGE) IMPLANT
TOWEL GREEN STERILE (TOWEL DISPOSABLE) ×2 IMPLANT
UNDERPAD 30X36 HEAVY ABSORB (UNDERPADS AND DIAPERS) ×2 IMPLANT
WATER STERILE IRR 1000ML POUR (IV SOLUTION) ×2 IMPLANT

## 2020-09-10 NOTE — Progress Notes (Signed)
Patient ID: Luis Orr, male   DOB: 10-Jun-1950, 70 y.o.   MRN: [id number] S: No events overnight O:BP 109/72   Pulse 87   Temp 98.6 F (37 C) (Oral)   Resp 20   Ht 5' 6"  (1.676 m)   Wt 78.5 kg   SpO2 99%   BMI 27.93 kg/m   Intake/Output Summary (Last 24 hours) at 08/13/2020 0843 Last data filed at 08/18/2020 0600 Gross per 24 hour  Intake 1022.64 ml  Output 150 ml  Net 872.64 ml   Intake/Output: I/O last 3 completed shifts: In: 1229.9 [I.V.:584.9; NG/GT:645] Out: 150 [Stool:150]  Intake/Output this shift:  No intake/output data recorded. Weight change: 0.9 kg Gen: on vent via trach CVS:RRR Resp: scattered rhonchi Abd:+BS, soft Ext: no edema  Recent Labs  Lab 09/04/20 0437 09/05/20 0349 09/07/20 0528 09/01/2020 0351  NA 134* 133* 130* 130*  K 4.4 4.4 3.2* 4.0  CL 97* 96* 88* 93*  CO2 26 24 24 24   GLUCOSE 104* 145* 163* 84  BUN 47* 68* 56* 72*  CREATININE 1.15 1.51* 1.18 1.32*  ALBUMIN 1.7* 1.7* 1.7* 1.7*  CALCIUM 8.7* 8.8* 9.3 9.4  PHOS  --  5.4* 4.4 4.9*  AST 30 26  --   --   ALT 21 20  --   --    Liver Function Tests: Recent Labs  Lab 09/04/20 0437 09/05/20 0349 09/07/20 0528 08/28/2020 0351  AST 30 26  --   --   ALT 21 20  --   --   ALKPHOS 223* 204*  --   --   BILITOT 0.5 0.5  --   --   PROT 7.2 6.9  --   --   ALBUMIN 1.7* 1.7* 1.7* 1.7*   No results for input(s): LIPASE, AMYLASE in the last 168 hours. No results for input(s): AMMONIA in the last 168 hours. CBC: Recent Labs  Lab 09/04/20 0437 09/05/20 0349 09/06/20 0945 09/07/20 0528 09/08/20 0200 09/09/20 0417 09/05/2020 0351  WBC 23.5*   < > 39.0* 28.3* 17.9* 16.7* 16.9*  NEUTROABS 17.9*  --   --   --   --   --  12.0*  HGB 8.8*   < > 9.9* 8.5* 8.0* 8.4* 8.3*  HCT 28.2*   < > 31.7* 26.6* 24.6* 26.6* 25.7*  MCV 82.5   < > 82.6 81.6 80.1 81.3 80.1  PLT 479*   < > 458* 445* 462* 498* 451*   < > = values in this interval not displayed.   Cardiac Enzymes: No results for input(s):  CKTOTAL, CKMB, CKMBINDEX, TROPONINI in the last 168 hours. CBG: Recent Labs  Lab 09/09/20 1550 09/09/20 2022 09/09/20 2324 09/01/2020 0340 08/31/2020 0749  GLUCAP 127* 122* 111* 88 70    Iron Studies: No results for input(s): IRON, TIBC, TRANSFERRIN, FERRITIN in the last 72 hours. Studies/Results: DG CHEST PORT 1 VIEW  Result Date: 09/09/2020 CLINICAL DATA:  Recheck pneumonia. EXAM: PORTABLE CHEST 1 VIEW COMPARISON:  August 27, 2020 FINDINGS: A left central line terminates in the SVC. The right central line is stable terminating in the SVC. The feeding tube terminates below today's film. The ETT is in good position. No pneumothorax. The cardiomediastinal silhouette is stable. Mild bibasilar opacities. No other acute abnormalities. IMPRESSION: 1. Support apparatus as above. 2. Bibasilar opacities may represent pneumonia in the appropriate clinical setting. Recommend short-term follow-up imaging after treatment to ensure resolution. Electronically Signed   By: Dorise Bullion III M.D.  On: 09/09/2020 20:43    amiodarone  200 mg Per Tube Daily   buPROPion  75 mg Per Tube BID   chlorhexidine gluconate (MEDLINE KIT)  15 mL Mouth Rinse BID   Chlorhexidine Gluconate Cloth  6 each Topical Daily   darbepoetin (ARANESP) injection - DIALYSIS  40 mcg Intravenous Q Mon-HD   feeding supplement (PROSource TF)  45 mL Per Tube TID   guaiFENesin  5 mL Per Tube BID   insulin aspart  0-9 Units Subcutaneous Q4H   insulin glargine-yfgn  25 Units Subcutaneous Daily   lidocaine  1 patch Transdermal Q24H   mouth rinse  15 mL Mouth Rinse 10 times per day   metaxalone  800 mg Per Tube TID   midodrine  5 mg Per Tube TID   multivitamin  1 tablet Per Tube QHS   pantoprazole sodium  40 mg Per Tube QHS   scopolamine  1 patch Transdermal Q72H   sertraline  100 mg Per Tube Daily   sodium bicarbonate  650 mg Per Tube Once   sodium chloride flush  10-40 mL Intracatheter Q12H    BMET    Component Value Date/Time    NA 130 (L) 09/09/2020 0351   K 4.0 08/31/2020 0351   CL 93 (L) 09/04/2020 0351   CO2 24 08/19/2020 0351   GLUCOSE 84 09/02/2020 0351   BUN 72 (H) 09/11/2020 0351   CREATININE 1.32 (H) 08/13/2020 0351   CALCIUM 9.4 09/04/2020 0351   GFRNONAA 58 (L) 08/21/2020 0351   GFRAA NOT CALCULATED 10/11/2019 0226   CBC    Component Value Date/Time   WBC 16.9 (H) 08/23/2020 0351   RBC 3.21 (L) 09/08/2020 0351   HGB 8.3 (L) 09/04/2020 0351   HCT 25.7 (L) 09/02/2020 0351   PLT 451 (H) 08/20/2020 0351   MCV 80.1 09/01/2020 0351   MCH 25.9 (L) 09/09/2020 0351   MCHC 32.3 09/01/2020 0351   RDW 19.3 (H) 08/25/2020 0351   LYMPHSABS 2.6 09/02/2020 0351   MONOABS 1.6 (H) 08/31/2020 0351   EOSABS 0.2 08/17/2020 0351   BASOSABS 0.1 08/29/2020 0351     Assessment/Plan:  AKI now ESRD - initiated on CRRT 07/18/20 and transitioned to IHD.  Has TDC but will require AVF/AVG for out of state SNF with plan for access today. Septic shock/VDRF associated PNA.  Off antibiotics.   VDRF - s/p trach, plan per PCCM.  Remains on vent.  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  Donetta Potts, MD [employer] [phone]

## 2020-09-10 NOTE — Progress Notes (Signed)
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 Admitted here from [place] with MDR pneumonia Now with prolonged hospitalization, awaiting placement Some bleeding from tracheostomy last week, now resolved ESRD > getting permanent HD access today  Objective: Vitals:   09/09/2020 0400 08/15/2020 0500 08/14/2020 0600 09/03/2020 0821  BP: 118/78 113/81 109/72   Pulse: 84 84 83 87  Resp: '20 20 20 20  '$ Temp:      TempSrc:      SpO2: 100% 98% 100% 99%  Weight:      Height:       Vent Mode: PRVC FiO2 (%):  [40 %] 40 % Set Rate:  [20 bmp] 20 bmp Vt Set:  [510 mL] 510 mL PEEP:  [5 cmH20] 5 cmH20 Plateau Pressure:  [29 cmH20-35 cmH20] 31 cmH20  Intake/Output Summary (Last 24 hours) at 08/29/2020 0843 Last data filed at 08/21/2020 0600 Gross per 24 hour  Intake 1022.64 ml  Output 150 ml  Net 872.64 ml    General:  In bed on vent HENT: NCAT tracheostomy in place PULM: CTA B, vent supported breathing CV: RRR, no mgr GI: BS+, soft, nontender MSK: normal bulk and tone Neuro: sedated on vent    CBC    Component Value Date/Time   WBC 16.9 (H) 09/07/2020 0351   RBC 3.21 (L) 09/05/2020 0351   HGB 8.3 (L) 09/07/2020 0351   HCT 25.7 (L) 09/04/2020 0351   PLT 451 (H) 08/24/2020 0351   MCV 80.1 08/27/2020 0351   MCH 25.9 (L) 08/19/2020 0351   MCHC 32.3 08/26/2020 0351   RDW 19.3 (H) 08/20/2020 0351   LYMPHSABS 2.6 09/09/2020 0351   MONOABS 1.6 (H) 08/27/2020 0351   EOSABS 0.2 08/26/2020 0351   BASOSABS 0.1 09/05/2020 0351    BMET    Component Value Date/Time   NA 130 (L) 08/26/2020 0351   K 4.0 08/25/2020 0351   CL 93 (L) 09/01/2020 0351   CO2 24 09/09/2020 0351   GLUCOSE 84 09/06/2020 0351   BUN 72 (H) 08/17/2020 0351   CREATININE 1.32 (H) 08/20/2020 0351   CALCIUM 9.4 08/19/2020 0351   GFRNONAA 58 (L) 09/08/2020 0351   GFRAA NOT CALCULATED 10/11/2019 0226    8/28 CXR > bibasilar airspace disease, tracheostomy, CVL, Perm  cath in place  Impression/Plan: Chronic respiratory failure with hypercarbia due to 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

## 2020-09-10 NOTE — Progress Notes (Signed)
[facility] PROGRESS NOTE  Luis Orr ZOX:[id number] DOB: 11-14-1950 DOA: 07/08/2020 PCP: Townsend Roger, MD  Status: Remains inpatient appropriate because:Hemodynamically unstable, Persistent severe electrolyte disturbances, Unsafe d/c plan, and Inpatient level of care appropriate due to severity of illness  Dispo: The patient is from: [facility] vent capable              Anticipated d/c is to: SNF Vent capable w/ access to in facility/stretcher based HD-a facility in [place] has been located in preauthorization in progress.              Patient currently is not medically stable to d/c.   Difficult to place patient Yes    Level of care: ICU  Code Status: Partial (no CPR and no defibrillation or cardioversion) Family Communication: [name] on 8/22.  Of note the wife is also the POA DVT prophylaxis: Eliquis COVID vaccination status: Unknown    HPI:  70 y.o. 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 Patient presented secondary to altered mental status and hypotension with recurrent pneumonia requiring vasopressor support and ICU admission.he has had multiple recent admissions for sepsis secondary to various infections including UTIs, pneumonias and bacteremia in setting of MDR Klebsiella, MRSA and pseudomonas. Most recent admission September 2021 in setting of sepsis due to MDR Klebsiella with right ureteral calculus s/p double J ureteral stent placement and treated with meropenem x 10days. He was discharged back to [facility].  ICU significant events: 6/29 admitted to ICU for septic shock on levo to maintain MAP>65, continued on vent support 6/29 Blood Cx>> staph species in 1 of 4 cultures drawn>> suspect contaminant 6/30 Off pressors 7/2 ID Consult for MDR acinetobacter in trach asp, Meropenem changed to unasyn 7/2 PRBC transfusion, iron replacement 7/5 Pressors added again. Central line placed.  Drainage from gastrostomy tube >> CT Abd without fluid collection around gastrostomy tube but with diffuse body wall edema. Echo EF 50-55%, mild LVH 7/6 Renal Replacement Therapy ; Code status changed to partial Code (DNR) 7/7 Transfuse 1U PRBC 7/9 add solu cortef 7/10 off pressors, weaning stress steroids 7/11 Pressors added back on again 7/12 ID consult due to rising WBC, hypothermia, hypotension concern for worsening infection. Vancomycin added. Repeat blood cx collected. 7/15 CRRT stopped 7/17 Restarted on low dose levo. No evidence of new infection. Held off on antibiotics. Nephrology contacted family and decision was made to continue iHD this week to allow additional time for renal recovery. Patient is not the best long term iHD candidate but family wants aggressive care 7/18 plans for iHD, tolerated with 2L removed 719 slight drop in hgb to 6.9 will transfuse 1 unit PRBC 7/22 > 7/26: Tolerating HD. TOC consulted for placement. Will likely need out of state placement. 8/15 WBCs greater than 28,000, hypoglycemia, procalcitonin >18, chest x-ray consistent with pneumonia, recent blood cultures consistent with contamination.  Unasyn IV resumed after sputum culture obtained 8/16 consultation placed with ethics committee regarding evaluation for futility of care 8/16 Sputum cx + for Acinetobacter with sensitivities pending 8/17 extensive conversation held with patient's wife regarding patient's poor prognosis and reemergence of Acinetobacter VAP.  Also discussed with her that patient has been able to convey to multiple staff that he does not want to continue on the ventilator or with dialysis.  Wife reports that the patient does not understand what he is saying, she is the POA and that she wants to continue aggressive care. 8/18 patient now  telling nephrology, myself and the rest of the staff he replies yes to wanting to remain on dialysis and on the ventilator and this direct question is  asked. 8/18 Acinetobacter sensitive to Unasyn only.  Discussed with ID/Dr. Juleen China.  Plan is to administer antibiotics for 7-day since patient has not had any increased O2 needs and remains afebrile.  If any concerns over worsening symptoms can give an additional 3 days. Developed moderate bleeding around tracheostomy site on 8/19.  Likely related to combination of Eliquis and heparin required for CRRT.  We will hold Eliquis for now and monitor degree of bleeding Eliquis resumed on 8/22.  Overnight into 8/23 patient bit his tongue and had bleeding.  Also had some bleeding around trach likely from oral bleeding.  Also though did have bleeding from old central line site. 8/23 had transient bleeding around trach and prior central line insertion site overnight.  Patient had bitten tongue and this likely explain the bleeding.  This morning patient was not having any bleeding.  On 8/25 had redeveloped slow ooze bleeding from mouth and trach.  Eliquis discontinued in favor of IV heparin especially given patient will undergo surgical procedure for vascular access on 8/29   Subjective: Awake and alert.  Able to convey that his throat was hurting due to the feeding tube.  Objective: Vitals:   09/11/2020 0500 09/11/2020 0600  BP: 113/81 109/72  Pulse: 84 83  Resp: 20 20  Temp:    SpO2: 98% 100%    Intake/Output Summary (Last 24 hours) at 09/01/2020 0741 Last data filed at 09/09/2020 0600 Gross per 24 hour  Intake 1085.98 ml  Output 150 ml  Net 935.98 ml   Filed Weights   09/08/20 1145 09/09/20 0110 08/20/2020 0351  Weight: 74.2 kg 74.4 kg 78.5 kg    Exam:  Constitutional: NAD, awakens easily Respiratory: Trach: 6.0 XLT cuffed, Vent settings: PRVC mode, Fio2 40%, rate 20, VT 510, PEEP 5; lung sounds remain clear to auscultation.  Expiratory effort per vent (diaphragm paralyzed).  No further peritracheal bleeding noted Cardiovascular: Maintaining SR/ST without ectopy, normotensive.  Focal dependent  bilateral hand edema slowly improving. Abdomen: LBM 8/29, PEG tube clogged.  Cortrack in place for feedings until back ordered feeding tube available. Left lower quadrant colostomy in place, abdomen soft and nondistended with normoactive bowel sounds. Neurologic: CN 2-12 grossly intact on visual inspection- patient with quadriplegia so unable to independently move extremities.  No definitive sensation of extremities when checked. Psychiatric: Awake and nods head appropriately to questions asked.     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 8/18 Discussed with ID/Dr Juleen China recommends 7 days of therapy -completed on 8/22   AKI with oliguria/volume overload/ New CKD requiring HD Anuric Nephrology following, initially required CRRT and has transition to HD (due to staffing issues continues on CRRT at bedside) Palliative care has been consulted and are following.  TDC placed on 7/26-permanent access placement planned for today 8/29    Septic shock 2/2 VAP POA Presented with sepsis physiology and profound shock which has now resolved  Chronic thoracic back pain Continue Oxy and fentanyl prn Better on scheduled Skelaxin and lidocaine patch  History of paroxysmal atrial fibrillation Patient was on amiodarone prior to admission -currently maintaining  Eliquis has been discontinued due to recurrent peritracheal and oral bleeding.  No further bleeding on  IV heparin.  We will continue IV heparin until surgical procedure as outlined above completed TSH was WNL this admission QTc 420 ms on 1/61   Acute metabolic encephalopathy Resolved Secondary to acute illness. CT head negative for acute process.  Back at baseline.   Diabetes mellitus, type 2 Currently on Semglee 25 units daily.  CBG is down in the 70s due to recent issues related to clogged PEG tube and inability to administer tube feeding.  Hold the 25 units today and give only 10 units for now. 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  Resolved. 8/23 total bilirubin, AST and ALT have all normalized  Quadriplegia secondary to compressive myelopathy.  Previously resided [facility] but with poor performance scores as well as poor rehabilitation potential [facility] will not accept him back if he is HD dependent (he must be able to sit up in a chair for dialysis.)   Microcytic anemia/Acute anemia superimposed on anemia of chronic kidney disease Patient required 4 units of PRBC to date. Thought to be secondary to a combination of acute illness, chronic disease and iron deficiency. IV iron given.  Hemoglobin on 8/26 was 8.5 Continue Aranesp   Severe protein calorie malnutrition Nutrition Problem: Increased nutrient needs Etiology: wound healing Signs/Symptoms: estimated needs Interventions: Tube feeding, Prostat, MVI Estimated body mass index is 27.93 kg/m as calculated from the following:   Height as of this encounter: 5' 6" (1.676 m).   Weight as of this encounter: 78.5 kg.  PEG tube unable to be declogged successfully.  Feeding tube on backorder.  Continue Cortrak for feedings and meds. Patient with sore throat and throat  discomfort secondary to cortrack tube.  Chloraseptic Spray ordered.  Pressure injury/stage IV sacral decubitus Stage IV medial/posterior sacrum present on admission.  Right/posterior/lateral ankle unknown if present on admission.  DTI left ear, not present on admission. (For additional documentation see wound care notes) 8/24 nursing states that stage IV decubitus on the sacrum is looking worse.  Currently has normal saline dressings every shift.  Wound care team contacted to reevaluate wound.  I suspect patient will need enzymatic treatment along with hydrotherapy.  I have asked wound care to take pictures to document appearance of wound.    Data Reviewed: Basic Metabolic Panel: Recent Labs  Lab 09/04/20 0437 09/05/20 0349 09/07/20 0528 09/02/2020 0351  NA 134* 133* 130* 130*  K 4.4 4.4 3.2* 4.0  CL 97* 96* 88* 93*  CO2 _0 GLUCOSE 104* 145* 163* 84  BUN 47* 68* 56* 72*  CREATININE 1.15 1.51* 1.18 1.32*  CALCIUM 8.7* 8.8* 9.3 9.4  MG  --  2.2  --   --   PHOS  --  5.4* 4.4 4.9*   Liver Function Tests: Recent Labs  Lab 09/04/20 0437 09/05/20 0349 09/07/20 0528 09/01/2020 0351  AST 30 26  --   --   ALT 21 20  --   --   ALKPHOS 223* 204*  --   --  BILITOT 0.5 0.5  --   --   PROT 7.2 6.9  --   --   ALBUMIN 1.7* 1.7* 1.7* 1.7*   No results for input(s): LIPASE, AMYLASE in the last 168 hours. No results for input(s): AMMONIA in the last 168 hours. CBC: Recent Labs  Lab 09/04/20 0437 09/05/20 0349 09/06/20 0945 09/07/20 0528 09/08/20 0200 09/09/20 0417 08/30/2020 0351  WBC 23.5*   < > 39.0* 28.3* 17.9* 16.7* 16.9*  NEUTROABS 17.9*  --   --   --   --   --  12.0*  HGB 8.8*   < > 9.9* 8.5* 8.0* 8.4* 8.3*  HCT 28.2*   < > 31.7* 26.6* 24.6* 26.6* 25.7*  MCV 82.5   < > 82.6 81.6 80.1 81.3 80.1  PLT 479*   < > 458* 445* 462* 498* 451*   < > = values in this interval not displayed.   Cardiac Enzymes: No results for input(s): CKTOTAL, CKMB, CKMBINDEX, TROPONINI in  the last 168 hours. BNP (last 3 results) Recent Labs    07/10/2020 1922  BNP 135.0*    ProBNP (last 3 results) No results for input(s): PROBNP in the last 8760 hours.  CBG: Recent Labs  Lab 09/09/20 1205 09/09/20 1550 09/09/20 2022 09/09/20 2324 08/17/2020 0340  GLUCAP 138* 127* 122* 111* 88       Studies: DG CHEST PORT 1 VIEW  Result Date: 09/09/2020 CLINICAL DATA:  Recheck pneumonia. EXAM: PORTABLE CHEST 1 VIEW COMPARISON:  August 27, 2020 FINDINGS: A left central line terminates in the SVC. The right central line is stable terminating in the SVC. The feeding tube terminates below today's film. The ETT is in good position. No pneumothorax. The cardiomediastinal silhouette is stable. Mild bibasilar opacities. No other acute abnormalities. IMPRESSION: 1. Support apparatus as above. 2. Bibasilar opacities may represent pneumonia in the appropriate clinical setting. Recommend short-term follow-up imaging after treatment to ensure resolution. Electronically Signed   By: Dorise Bullion III M.D.   On: 09/09/2020 20:43    Scheduled Meds:  amiodarone  200 mg Per Tube Daily   buPROPion  75 mg Per Tube BID   chlorhexidine gluconate (MEDLINE KIT)  15 mL Mouth Rinse BID   Chlorhexidine Gluconate Cloth  6 each Topical Daily   darbepoetin (ARANESP) injection - DIALYSIS  40 mcg Intravenous Q Mon-HD   feeding supplement (PROSource TF)  45 mL Per Tube TID   guaiFENesin  5 mL Per Tube BID   insulin aspart  0-9 Units Subcutaneous Q4H   insulin glargine-yfgn  25 Units Subcutaneous Daily   lidocaine  1 patch Transdermal Q24H   mouth rinse  15 mL Mouth Rinse 10 times per day   metaxalone  800 mg Per Tube TID   midodrine  5 mg Per Tube TID   multivitamin  1 tablet Per Tube QHS   pantoprazole sodium  40 mg Per Tube QHS   scopolamine  1 patch Transdermal Q72H   sertraline  100 mg Per Tube Daily   sodium bicarbonate  650 mg Per Tube Once   sodium chloride flush  10-40 mL Intracatheter Q12H    Continuous Infusions:  sodium chloride Stopped (07/26/20 0824)   sodium chloride 500 mL (09/01/20 1408)   sodium chloride     sodium chloride     sodium chloride     cefUROXime (ZINACEF)  IV     feeding supplement (NEPRO CARB STEADY) Stopped (09/07/2020 0000)   heparin 1,700 Units/hr (08/19/2020 0600)  Active Problems:   Ventilator dependent (HCC)   Septic shock (HCC)   Pressure injury of skin   Acute respiratory failure with hypoxia (HCC)   AKI (acute kidney injury) ([place])   Hypotension   Goals of care, counseling/discussion   Quadriplegia ([place])   Chronic kidney disease requiring chronic dialysis ([place])   Hyperglycemia   Sepsis due to Acinetobacter baumannii ([place])   Pneumonia due to Acinetobacter species ([place])   Dysautonomia ([place])   Chronic midline thoracic back pain   Consultants: PCCM Nephrology Infectious disease Palliative care medicine  Procedures: Echocardiogram Insertion of double-lumen HD catheter  Antibiotics: Cefepime x1 dose Meropenem 6/29 through 7/2 Vancomycin 6/29 through 6/30 Unasyn 7/2 through 7/15 Unasyn 8/15 >   Time spent: 35 minutes    Erin Hearing ANP  [facility] 7 am - 330 pm/M-F for direct patient care and secure chat Please refer to Amion for contact info 61  days

## 2020-09-10 NOTE — Transfer of Care (Signed)
Immediate Anesthesia Transfer of Care Note  Patient: Luis Orr  Procedure(s) Performed: INSERTION OF LEFT UPPER ARM ARTERIOVENOUS (AV) GORE-TEX GRAFT (Left: Arm Upper)  Patient Location: ICU  Anesthesia Type:General  Level of Consciousness: awake  Airway & Oxygen Therapy: Patient connected to tracheostomy mask oxygen  Post-op Assessment: Report given to RN and Post -op Vital signs reviewed and stable  Post vital signs: Reviewed and stable  Last Vitals:  Vitals Value Taken Time  BP 127/76   Temp 36.2   Pulse 86 08/20/2020 1341  Resp 20 08/28/2020 1341  SpO2 98 % 08/21/2020 1341  Vitals shown include unvalidated device data.  Last Pain:  Vitals:   09/06/2020 0300  TempSrc: Oral  PainSc:       Patients Stated Pain Goal: 0 (09/09/20 1900)   Pt status as preop. Vent settings unchanged from pre-procedure on ICU vent.  Complications: No notable events documented.

## 2020-09-10 NOTE — Progress Notes (Signed)
[facility] [place] PROGRESS NOTE  ASSESSMENT / PLAN: Luis Orr is a 70 y.o. male with new diagnosis of ESRD. He needs permanent dialysis access before he can transition to next level of care. Plan LUE AVG in OR today.   SUBJECTIVE: Trach in place. Appears comfortable.  OBJECTIVE: BP 109/72   Pulse 87   Temp 98.6 F (37 C) (Oral)   Resp 20   Ht '5\' 6"'$  (1.676 m)   Wt 78.5 kg   SpO2 99%   BMI 27.93 kg/m   Intake/Output Summary (Last 24 hours) at 09/11/2020 1113 Last data filed at 09/02/2020 [id number] Gross per 24 hour  Intake 991.5 ml  Output 150 ml  Net 841.5 ml    No acute distress Tracheostomy in place RIJ TDC   CBC Latest Ref Rng & Units 09/04/2020 09/09/2020 09/08/2020  WBC 4.0 - 10.5 K/uL 16.9(H) 16.7(H) 17.9(H)  Hemoglobin 13.0 - 17.0 g/dL 8.3(L) 8.4(L) 8.0(L)  Hematocrit 39.0 - 52.0 % 25.7(L) 26.6(L) 24.6(L)  Platelets 150 - 400 K/uL 451(H) 498(H) 462(H)     CMP Latest Ref Rng & Units 08/22/2020 09/07/2020 09/05/2020  Glucose 70 - 99 mg/dL 84 163(H) 145(H)  BUN 8 - 23 mg/dL 72(H) 56(H) 68(H)  Creatinine 0.61 - 1.24 mg/dL 1.32(H) 1.18 1.51(H)  Sodium 135 - 145 mmol/L 130(L) 130(L) 133(L)  Potassium 3.5 - 5.1 mmol/L 4.0 3.2(L) 4.4  Chloride 98 - 111 mmol/L 93(L) 88(L) 96(L)  CO2 22 - 32 mmol/L '24 24 24  '$ Calcium 8.9 - 10.3 mg/dL 9.4 9.3 8.8(L)  Total Protein 6.5 - 8.1 g/dL - - 6.9  Total Bilirubin 0.3 - 1.2 mg/dL - - 0.5  Alkaline Phos 38 - 126 U/L - - 204(H)  AST 15 - 41 U/L - - 26  ALT 0 - 44 U/L - - 20    Estimated Creatinine Clearance: 51.3 mL/min (A) (by C-G formula based on SCr of 1.32 mg/dL (H)).  Yevonne Aline. Stanford Breed, MD [facility] Phone Number: [phone] 08/27/2020 11:13 AM

## 2020-09-10 NOTE — Op Note (Signed)
DATE OF SERVICE: 08/26/2020  PATIENT:  Luis Orr  70 y.o. male  PRE-OPERATIVE DIAGNOSIS:  ESRD  POST-OPERATIVE DIAGNOSIS:  Same  PROCEDURE:   left upper extremity forearm loop arteriovenous graft (4-75m PTFE)  SURGEON:  Surgeon(s) and Role:    * HCherre Robins MD - Primary  ASSISTANT: SKateri Plummer PA-C  An assistant was required to facilitate exposure and expedite the case.  ANESTHESIA:   regional and MAC  EBL: min  BLOOD ADMINISTERED:none  DRAINS: none   LOCAL MEDICATIONS USED:  NONE  SPECIMEN:  none  COUNTS: confirmed correct.  TOURNIQUET:  none  PATIENT DISPOSITION:  PACU - hemodynamically stable.   Delay start of Pharmacological VTE agent (>24hrs) due to surgical blood loss or risk of bleeding: no  INDICATION FOR PROCEDURE: LAMUN STEMMis a 70y.o. male with end-stage renal disease.  He is in need of a dialysis graft before a facility will accept him in transfer. After careful discussion of risks, benefits, and alternatives the patient was offered arteriovenous graft. The patient's family understood and wished to proceed.  OPERATIVE FINDINGS: Exposed brachial artery and vein just proximal to the antecubital crease.  Patient's arm is chronically contracted and did not allow for complete abduction of the shoulder.  We elected to do a forearm loop arteriovenous graft over the dorsal aspect of the forearm to allow for easier cannulation.  Good Doppler thrill at completion in the brachial vein.  DESCRIPTION OF PROCEDURE: After identification of the patient in the pre-operative holding area, the patient was transferred to the operating room. The patient was positioned supine on the operating room table. Anesthesia was induced. The left arm was prepped and draped in standard fashion. A surgical pause was performed confirming correct patient, procedure, and operative location.  Longitudinal incision was made over the brachial pulse in the distal left arm.   Incision was carried down through subtenons tissue until the brachial sheath was encountered.  This was entered sharply.  The brachial artery was skeletonized.  The brachial vein was skeletonized.  I examined the brachial vein at the axilla with ultrasound.  I did not feel it would be technically easy to expose the vein at this area.  We elected to proceed with a forearm loop AV graft.  A curved tunneler was used to create a teardrop loop over the dorsal left forearm.  A counterincision was needed.  A 4 to 7 mm PTFE graft was delivered through the tunnel.  The 4 mm aspect was cut to allow end-to-side anastomosis the brachial artery.  The brachial artery was clamped proximally distally.  Next longitudinal arteriotomy was made extended with small Potts scissors.  A 6-0 Prolene suture was used to perform end-to-side anastomosis using continuous running technique.  After completion the graft was flushed down the open end.  The anastomosis was packed.  Attention was turned to the venous anastomosis.  The brachial vein was clamped proximally distally.  A longitudinal venotomy was made with an 11 blade and extended with Potts others.  The 7 mm end of the vascular graft was spatulated to allow end-to-side anastomosis.  This was done using continuous running suture of 6-0 Prolene.  Immediately prior to completion the graft was flushed and de-aired.  Anastomosis was completed.  Clamps were released on the graft and vein.  The graft outflow was interrogated with a Doppler machine there was a strong thrill which was lost with occlusion of the bypass.  Wounds were closed in layers using  3-0 Vicryl and 4-0 Monocryl.  Dermabond was applied.  Upon completion of the case instrument and sharps counts were confirmed correct. The patient was transferred to the PACU in good condition. I was present for all portions of the procedure.  Yevonne Aline. Stanford Breed, MD [facility] Phone Number: [phone] 09/03/2020 1:15 PM

## 2020-09-10 NOTE — Progress Notes (Signed)
NAME:  Luis Orr, MRN:  [id number], DOB:  06/17/1950, LOS: 59 ADMISSION DATE:  07/10/2020, CONSULTATION DATE:  07/03/2020 REFERRING MD:  Dr Francia Greaves, EDP CHIEF COMPLAINT:  septic shock    History of Present Illness:  70 yo male from [facility] with hx of C3-C5 compressive myelopathy with functional quadriplegia and chronic trach/vent presented to ED with altered mental status and hypotension with recurrent HCAP.  Has multiple recent admissions for sepsis secondary to various infections including UTIs, pneumonias and bacteremia in setting of MDR Klebsiella, MRSA and pseudomonas. Most recent admission September 2021 in setting of sepsis due to MDR Klebsiella with right ureteral calculus s/p double J ureteral stent placement and treated with meropenem x 10days.  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 Central line placed. Drainage from gastrostomy tube >> CT Abd without fluid collection around gastrostomy tube but with diffuse body wall edema. Echo EF 50-55%, mild LVH  7/6 Renal Replacement Therapy ; Code status changed to partial Code (DNR) 7/7 Transfuse 1U PRBC 7/9 add solu cortef 7/10 off pressors, weaning stress steroids  7/11 Pressors added back on again 7/12 ID consult due to rising WBC, hypothermia, hypotension concern for worsening infection. Vancomycin added. Repeat blood cx collected. 7/15 CRRT stopped   7/17 Restarted on low dose levo. No evidence of new infection. Held off on antibiotics. Nephrology contacted family and decision was made to continue iHD this week to allow additional time for renal recovery. Patient is not a long term iHD candidate  7/18 plans for iHD, tolerated with 2L removed  7/19 slight drop in hgb to 6.9 will transfuse 1 unit PRBC  7/20 To [facility], PCCM following for trach / vent needs 7/22>> tolerating HD, will need out of state placement 8/4 last PCCM visit. 8/4 -8/8: No issues. CM workin gon out of state placement  8/15 no acute changes. Continues on PRVC, was apneic with PSV attempt. Did wean to 40% FiO2   Interim History / Subjective:  Remains on full support - 40% / PEEP 5  Bleeding from trach site is resolved.   Objective   Blood pressure 109/72, pulse 87, temperature 98.6 F (37 C), temperature source Oral, resp. rate 20, height '5\' 6"'$  (1.676 m), weight 78.5 kg, SpO2 99 %.    Vent Mode: PRVC FiO2 (%):  [40 %] 40 % Set Rate:  [20 bmp] 20 bmp Vt Set:  [510 mL] 510 mL PEEP:  [5 cmH20] 5 cmH20 Plateau Pressure:  [29 cmH20-35 cmH20] 31 cmH20   Intake/Output Summary (Last 24 hours) at 08/18/2020 0830 Last data filed at 08/17/2020 0600 Gross per 24 hour  Intake 1022.64 ml  Output 150 ml  Net 872.64 ml    Filed Weights   09/08/20 1145 09/09/20 0110 08/17/2020 0351  Weight: 74.2 kg 74.4 kg 78.5 kg    Physical Exam: General:  overweight elderly appearing male in NAD on vent.  Neuro:  Awake, alert, nods/and shakes  head to answer yes/no HEENT:  [place]/AT, No JVD noted, PERRL. Oral secretions from trach stoma. No meaningful pulmonary secretions.  Cardiovascular:  RRR, no MRG Lungs:  Clear bilateral breath sounds.  Abdomen:  Soft, non-distended. PEG site intact. Gas in bag.  Musculoskeletal:  No acute deformity Skin:  Intact, MMM    Resolved Hospital Problem list   Hyperkalemia Tachycardia Hypotension Thrombocytosis  AKI Septic shock 2nd to HCAP and UTI all  present on admission -Acinetobacter in sputum culture from 07/12/20. Completed 14 days of Unasyn for -Of pressors on 4/14. Restarted  levo 7/17. -acute on chronic respiratory failure   Assessment & Plan:   C3-C5 compressive Myelopathy w/ resultant Chronic Respiratory failure, vent and trach dependence  Acinetobacter Baumannii PNA Not weaning -PRVC 8cc/kg as support mode, not able to wean  -Trach care per protocol  -VAP prevention measures  -DNR in event of cardiac arrest   For permanent HD access placement today.   Awaiting placement.   PCCM will continue to see twice weekly   Georgann Housekeeper, AGACNP-BC [place] for personal pager PCCM on call pager [phone] until 7pm. Please call [place] 7p-7a. [phone]  08/25/2020 8:30 AM

## 2020-09-10 NOTE — Progress Notes (Addendum)
[place] for Heparin Indication: atrial fibrillation while apixaban on hold  Allergies  Allergen Reactions   Adhesive [Tape] Rash    Patient Measurements: Height: '5\' 6"'$  (167.6 cm) Weight: 78.5 kg (173 lb 1 oz) IBW/kg (Calculated) : 63.8 Heparin Dosing Weight: 74.6 kg   Vital Signs: Temp: 98.6 F (37 C) (08/29 0300) Temp Source: Oral (08/29 0300) BP: 109/72 (08/29 0600) Pulse Rate: 87 (08/29 0821)  Labs: Recent Labs    09/08/20 0200 09/08/20 1400 09/09/20 0417 09/09/20 1702 08/16/2020 0349 09/07/2020 0351  HGB 8.0*  --  8.4*  --   --  8.3*  HCT 24.6*  --  26.6*  --   --  25.7*  PLT 462*  --  498*  --   --  451*  APTT 59* 103* 58*  --   --   --   HEPARINUNFRC 0.23*  --  0.25* 0.24* 0.24*  --   CREATININE  --   --   --   --   --  1.32*     Estimated Creatinine Clearance: 51.3 mL/min (A) (by C-G formula based on SCr of 1.32 mg/dL (H)).   Medical History: Past Medical History:  Diagnosis Date   Anemia    Anxiety    Chronic respiratory failure (HCC)    DM2 (diabetes mellitus, type 2) (HCC)    Functional quadriplegia (HCC)    Hypertension    Multiple drug resistant organism (MDRO) culture positive    Urine retention    UTI (urinary tract infection)     Assessment: 70 yo male admitted 06/26/2020 on apixaban for Afib. Pharmacy consulted to dose heparin with oral and peri tracheal bleeding. Minimal bleeding in mouth and around trach site - per RN possibly from patient biting lip. No other bleeding noted. Last dose apixaban 8/24 at 1430 (doses on 8/24 PM held per MD).   Heparin level of 0.24 is subtherapeutic after rate increase to heparin 1700 units/hr. Hgb 8.3. Plt wnl. Per RN no issues with IV access or infusion. Per RN, no bleeding noted around trach or in mouth this morning. No other bleeding noted.   Goal of Therapy:  Heparin level 0.3-0.7 units/ml aPTT 66-102 seconds Monitor platelets by anticoagulation protocol: Yes    Plan:  Increase heparin to 1800 units/hr Dr. Carlis Abbott ok with heparin running until patient taken to procedure Follow up plans for anticoagulation after procedure today for resuming heparin and rechecking heparin level  Follow up plans to resume oral anticoagulation  Cristela Felt, PharmD, BCPS Clinical Pharmacist 08/18/2020 9:07 AM

## 2020-09-10 NOTE — TOC Progression Note (Signed)
Transition of Care [facility]) - Progression Note    Patient Details  Name: Luis Orr MRN: [id number] Date of Birth: [birth year]  [facility]) CM/SW Contact  Curlene Labrum, RN Phone Number: 08/30/2020, 1:29 PM  Clinical Narrative:    Case management spoke with the patient's wife on the phone and the wife is aware that the patient has a pending bed offer at [facility] in [place], [place].  The patient's wife is going to send financial documents to the hospital on Thursday that I can forward to the MSW at [facility] to assist with patient's Medicaid being established in [place].  The patient's wife states that when the patient is able to transfer to the facility at a later day - that she plans on sending the patient's cell phone to the facility to that she can communicate with him at the bedside.  I called Amber, CM at [facility] at [phone] and she is aware that the patient is schedule for surgery today to have AV Graft placed since the facility will need permanent access for HD and are unable to use the tunneled HD catheter.  CM and MSW with DTP Team will continue to follow the patient for transitions of care needs to [facility] once [place] Medicaid is approved -process started by the receiving facility.   Expected Discharge Plan: [place] Barriers to Discharge: Continued Medical Work up, No SNF bed, Vent Bed not available  Expected Discharge Plan and Services Expected Discharge Plan: [place]   Discharge Planning Services: CM Consult Post Acute Care Choice: [place] Living arrangements for the past 2 months: Post-Acute Facility                                       Social Determinants of Health (SDOH) Interventions    Readmission Risk Interventions No flowsheet data found.

## 2020-09-10 NOTE — Anesthesia Preprocedure Evaluation (Addendum)
Anesthesia Evaluation  Patient identified by MRN, date of birth, ID band Patient awake    Reviewed: Allergy & Precautions, NPO status , Patient's Chart, lab work & pertinent test results  History of Anesthesia Complications Negative for: history of anesthetic complications  Airway Mallampati: Trach       Dental  (+) Poor Dentition   Pulmonary COPD,  COPD inhaler,  C3-C5 compressive myelopathy with functional quadriplegia and chronic trach/vent   + rhonchi (on R)        Cardiovascular hypertension, Pt. on medications (-) angina+ dysrhythmias Atrial Fibrillation  Rhythm:Regular Rate:Normal  07/2020 ECHO: right ventricle appears moderately enlarged with reduced function. Left ventricular EF 50 to 55%. The LV has low normal function, no significant valvular abnormalities   Neuro/Psych Anxiety    GI/Hepatic Neg liver ROS, GERD  Medicated,G-tube   Endo/Other  diabetes (glu 77), Insulin Dependent  Renal/GU Renal InsufficiencyRenal disease (creat 1.32)     Musculoskeletal   Abdominal (+) - obese,   Peds  Hematology  (+) Blood dyscrasia (Hb 8.3), anemia , xarelto   Anesthesia Other Findings   Reproductive/Obstetrics                           Anesthesia Physical Anesthesia Plan  ASA: 4  Anesthesia Plan: General   Post-op Pain Management:    Induction: Inhalational  PONV Risk Score and Plan: 2 and Ondansetron and Dexamethasone  Airway Management Planned: Tracheostomy  Additional Equipment: None  Intra-op Plan:   Post-operative Plan: Post-operative intubation/ventilation  Informed Consent: I have reviewed the patients History and Physical, chart, labs and discussed the procedure including the risks, benefits and alternatives for the proposed anesthesia with the patient or authorized representative who has indicated his/her understanding and acceptance.   Patient has DNR.  Discussed DNR  with power of attorney and Suspend DNR.   History available from chart only and Consent reviewed with POA  Plan Discussed with: Surgeon and CRNA  Anesthesia Plan Comments: (Discussed with patient, discussed with wife by telephone)      Anesthesia Quick Evaluation

## 2020-09-11 ENCOUNTER — Encounter (HOSPITAL_COMMUNITY): Payer: Self-pay | Admitting: Vascular Surgery

## 2020-09-11 LAB — HEPARIN LEVEL (UNFRACTIONATED)
Heparin Unfractionated: 0.2 IU/mL — ABNORMAL LOW (ref 0.30–0.70)
Heparin Unfractionated: 0.43 IU/mL (ref 0.30–0.70)
Heparin Unfractionated: 0.26 IU/mL — ABNORMAL LOW (ref 0.30–0.70)

## 2020-09-11 LAB — COMPREHENSIVE METABOLIC PANEL
ALT: 15 U/L (ref 0–44)
AST: 28 U/L (ref 15–41)
Albumin: 1.7 g/dL — ABNORMAL LOW (ref 3.5–5.0)
Alkaline Phosphatase: 182 U/L — ABNORMAL HIGH (ref 38–126)
Anion gap: 16 — ABNORMAL HIGH (ref 5–15)
BUN: 89 mg/dL — ABNORMAL HIGH (ref 8–23)
CO2: 20 mmol/L — ABNORMAL LOW (ref 22–32)
Calcium: 9 mg/dL (ref 8.9–10.3)
Chloride: 95 mmol/L — ABNORMAL LOW (ref 98–111)
Creatinine, Ser: 1.57 mg/dL — ABNORMAL HIGH (ref 0.61–1.24)
GFR, Estimated: 47 mL/min — ABNORMAL LOW (ref >60)
Glucose, Bld: 169 mg/dL — ABNORMAL HIGH (ref 70–99)
Potassium: 3.9 mmol/L (ref 3.5–5.1)
Sodium: 131 mmol/L — ABNORMAL LOW (ref 135–145)
Total Bilirubin: 0.8 mg/dL (ref 0.3–1.2)
Total Protein: 6.9 g/dL (ref 6.5–8.1)

## 2020-09-11 LAB — CBC
HCT: 25.7 % — ABNORMAL LOW (ref 39.0–52.0)
Hemoglobin: 8.1 g/dL — ABNORMAL LOW (ref 13.0–17.0)
MCH: 25.6 pg — ABNORMAL LOW (ref 26.0–34.0)
MCHC: 31.5 g/dL (ref 30.0–36.0)
MCV: 81.1 fL (ref 80.0–100.0)
Platelets: 446 10*3/uL — ABNORMAL HIGH (ref 150–400)
RBC: 3.17 MIL/uL — ABNORMAL LOW (ref 4.22–5.81)
RDW: 19.3 % — ABNORMAL HIGH (ref 11.5–15.5)
WBC: 14.6 10*3/uL — ABNORMAL HIGH (ref 4.0–10.5)
nRBC: 0.1 % (ref 0.0–0.2)

## 2020-09-11 LAB — GLUCOSE, CAPILLARY
Glucose-Capillary: 159 mg/dL — ABNORMAL HIGH (ref 70–99)
Glucose-Capillary: 185 mg/dL — ABNORMAL HIGH (ref 70–99)
Glucose-Capillary: 206 mg/dL — ABNORMAL HIGH (ref 70–99)
Glucose-Capillary: 138 mg/dL — ABNORMAL HIGH (ref 70–99)
Glucose-Capillary: 154 mg/dL — ABNORMAL HIGH (ref 70–99)
Glucose-Capillary: 149 mg/dL — ABNORMAL HIGH (ref 70–99)

## 2020-09-11 LAB — PHOSPHORUS: Phosphorus: 6.4 mg/dL — ABNORMAL HIGH (ref 2.5–4.6)

## 2020-09-11 LAB — MAGNESIUM: Magnesium: 2.4 mg/dL (ref 1.7–2.4)

## 2020-09-11 NOTE — Progress Notes (Addendum)
  Postoperative hemodialysis access     Date of Surgery:  08/17/2020 Surgeon: Stanford Breed   PHYSICAL EXAMINATION:  Vitals:   09/11/20 0600 09/11/20 0700  BP: 137/82 132/78  Pulse: 89 88  Resp: 20 20  Temp:    SpO2: 99% 98%    Incision is clean -left radial pulse is palpable There is not  Thrill  There is not bruit. There is not doppler flow in the graft    ASSESSMENT/PLAN:  Luis Orr is a 70 y.o. year old male who is s/p left forearm loop AVG.  -graft is not patent-will need thrombectomy vs new access.  Most likely new upper arm graft given the graft occluded this soon.     Leontine Locket, PA-C [facility] [phone]   VASCULAR STAFF ADDENDUM: I have independently interviewed and examined the patient. I agree with the above.  LUE Graft thrombosed POD#0-1.  Plan excision and re-do graft tomorrow in OR with Dr. Trula Slade.  NPO after midnight. Hold TF at midnight.   Yevonne Aline. Stanford Breed, MD [facility] Phone Number: [phone] 09/11/2020 1:17 PM

## 2020-09-11 NOTE — Progress Notes (Signed)
Patient ID: Luis Orr, male   DOB: [birth year], 70 y.o.   MRN: [id number] S: Pt underwent left avg placement yesterday but it is already clotted. O:BP 128/73   Pulse 87   Temp 98.2 F (36.8 C) (Oral)   Resp 20   Ht 5' 6"  (1.676 m)   Wt 78.5 kg   SpO2 100%   BMI 27.93 kg/m   Intake/Output Summary (Last 24 hours) at 09/11/2020 1040 Last data filed at 09/11/2020 0800 Gross per 24 hour  Intake 1385.28 ml  Output 65 ml  Net 1320.28 ml   Intake/Output: I/O last 3 completed shifts: In: 1986.5 [I.V.:782.7; NG/GT:1203.8] Out: 47 [Stool:40; Blood:25]  Intake/Output this shift:  Total I/O In: 39 [I.V.:39] Out: -  Weight change: 0 kg Gen:on vent via trach CVS:RRR Resp:ventilated BS Abd:+BS, soft, NT/ND Ext: trace edema of lower extremities, left forearm AVG without thrill or bruit  Recent Labs  Lab 09/05/20 0349 09/07/20 0528 09/11/2020 0351 09/11/20 0312  NA 133* 130* 130* 131*  K 4.4 3.2* 4.0 3.9  CL 96* 88* 93* 95*  CO2 24 24 24  20*  GLUCOSE 145* 163* 84 169*  BUN 68* 56* 72* 89*  CREATININE 1.51* 1.18 1.32* 1.57*  ALBUMIN 1.7* 1.7* 1.7* 1.7*  CALCIUM 8.8* 9.3 9.4 9.0  PHOS 5.4* 4.4 4.9* 6.4*  AST 26  --   --  28  ALT 20  --   --  15   Liver Function Tests: Recent Labs  Lab 09/05/20 0349 09/07/20 0528 09/07/2020 0351 09/11/20 0312  AST 26  --   --  28  ALT 20  --   --  15  ALKPHOS 204*  --   --  182*  BILITOT 0.5  --   --  0.8  PROT 6.9  --   --  6.9  ALBUMIN 1.7* 1.7* 1.7* 1.7*   No results for input(s): LIPASE, AMYLASE in the last 168 hours. No results for input(s): AMMONIA in the last 168 hours. CBC: Recent Labs  Lab 09/07/20 0528 09/08/20 0200 09/09/20 0417 08/18/2020 0351 09/11/20 0312  WBC 28.3* 17.9* 16.7* 16.9* 14.6*  NEUTROABS  --   --   --  12.0*  --   HGB 8.5* 8.0* 8.4* 8.3* 8.1*  HCT 26.6* 24.6* 26.6* 25.7* 25.7*  MCV 81.6 80.1 81.3 80.1 81.1  PLT 445* 462* 498* 451* 446*   Cardiac Enzymes: No results for input(s): CKTOTAL, CKMB,  CKMBINDEX, TROPONINI in the last 168 hours. CBG: Recent Labs  Lab 08/16/2020 1558 09/07/2020 1947 08/25/2020 2318 09/11/20 0309 09/11/20 0805  GLUCAP 96 121* 118* 159* 185*    Iron Studies: No results for input(s): IRON, TIBC, TRANSFERRIN, FERRITIN in the last 72 hours. Studies/Results: DG CHEST PORT 1 VIEW  Result Date: 09/09/2020 CLINICAL DATA:  Recheck pneumonia. EXAM: PORTABLE CHEST 1 VIEW COMPARISON:  August 27, 2020 FINDINGS: A left central line terminates in the SVC. The right central line is stable terminating in the SVC. The feeding tube terminates below today's film. The ETT is in good position. No pneumothorax. The cardiomediastinal silhouette is stable. Mild bibasilar opacities. No other acute abnormalities. IMPRESSION: 1. Support apparatus as above. 2. Bibasilar opacities may represent pneumonia in the appropriate clinical setting. Recommend short-term follow-up imaging after treatment to ensure resolution. Electronically Signed   By: Dorise Bullion III M.D.   On: 09/09/2020 20:43    amiodarone  200 mg Per Tube Daily   buPROPion  75 mg Per Tube BID  chlorhexidine gluconate (MEDLINE KIT)  15 mL Mouth Rinse BID   Chlorhexidine Gluconate Cloth  6 each Topical Daily   darbepoetin (ARANESP) injection - DIALYSIS  40 mcg Intravenous Q Tue-HD   feeding supplement (PROSource TF)  45 mL Per Tube TID   guaiFENesin  5 mL Per Tube BID   insulin aspart  0-9 Units Subcutaneous Q4H   insulin glargine-yfgn  25 Units Subcutaneous Daily   lidocaine  1 patch Transdermal Q24H   mouth rinse  15 mL Mouth Rinse 10 times per day   metaxalone  800 mg Per Tube TID   midodrine  5 mg Per Tube TID   multivitamin  1 tablet Per Tube QHS   pantoprazole sodium  40 mg Per Tube QHS   scopolamine  1 patch Transdermal Q72H   sertraline  100 mg Per Tube Daily   sodium bicarbonate  650 mg Per Tube Once   sodium chloride flush  10-40 mL Intracatheter Q12H    BMET    Component Value Date/Time   NA 131 (L)  09/11/2020 0312   K 3.9 09/11/2020 0312   CL 95 (L) 09/11/2020 0312   CO2 20 (L) 09/11/2020 0312   GLUCOSE 169 (H) 09/11/2020 0312   BUN 89 (H) 09/11/2020 0312   CREATININE 1.57 (H) 09/11/2020 0312   CALCIUM 9.0 09/11/2020 0312   GFRNONAA 47 (L) 09/11/2020 0312   GFRAA NOT CALCULATED 10/11/2019 0226   CBC    Component Value Date/Time   WBC 14.6 (H) 09/11/2020 0312   RBC 3.17 (L) 09/11/2020 0312   HGB 8.1 (L) 09/11/2020 0312   HCT 25.7 (L) 09/11/2020 0312   PLT 446 (H) 09/11/2020 0312   MCV 81.1 09/11/2020 0312   MCH 25.6 (L) 09/11/2020 0312   MCHC 31.5 09/11/2020 0312   RDW 19.3 (H) 09/11/2020 0312   LYMPHSABS 2.6 08/13/2020 0351   MONOABS 1.6 (H) 09/11/2020 0351   EOSABS 0.2 09/11/2020 0351   BASOSABS 0.1 08/14/2020 0351   Assessment/Plan:   AKI now ESRD - initiated on CRRT 07/18/20 and transitioned to IHD.  Has [facility] but will require AVF/AVG for out of state LTC facility.  Continue with HD on TTS schedule for now. Septic shock/VDRF associated PNA.  Off antibiotics.   VDRF - s/p trach, plan per PCCM.  Remains on vent.  Quadriplegia - due to compressive myelopathy involving C3-C5. CKD-BMD stable Anemia - on ESA and follow H/H Acute metabolic encephalopathy - continue with supportive care Vascular access - had left forearm AVG placed 08/13/2020 but unfortunately has clotted.  VVS following and plan for declot vs new LUE AVG. Disposition - pt cannot return to [facility] now that he is ESRD and will need AVG placement before out of state SNF will accept.  Donetta Potts, MD [employer] [phone]

## 2020-09-11 NOTE — Progress Notes (Signed)
[place] for Heparin Indication: atrial fibrillation while apixaban on hold  Allergies  Allergen Reactions   Adhesive [Tape] Rash    Patient Measurements: Height: '5\' 6"'$  (167.6 cm) Weight: 83.4 kg (183 lb 13.8 oz) IBW/kg (Calculated) : 63.8 Heparin Dosing Weight: 74.6 kg   Vital Signs: Temp: 98.7 F (37.1 C) (08/30 1518) Temp Source: Oral (08/30 1518) BP: 150/81 (08/30 1500) Pulse Rate: 113 (08/30 1518)  Labs: Recent Labs    09/09/20 0417 09/09/20 1702 08/15/2020 0349 08/21/2020 0351 09/11/20 0312 09/11/20 1430  HGB 8.4*  --   --  8.3* 8.1*  --   HCT 26.6*  --   --  25.7* 25.7*  --   PLT 498*  --   --  451* 446*  --   APTT 58*  --   --   --   --   --   HEPARINUNFRC 0.25*   < > 0.24*  --  0.20* 0.43  CREATININE  --   --   --  1.32* 1.57*  --    < > = values in this interval not displayed.     Estimated Creatinine Clearance: 44.3 mL/min (A) (by C-G formula based on SCr of 1.57 mg/dL (H)).   Medical History: Past Medical History:  Diagnosis Date   Anemia    Anxiety    Chronic respiratory failure (HCC)    DM2 (diabetes mellitus, type 2) (HCC)    Functional quadriplegia (HCC)    Hypertension    Multiple drug resistant organism (MDRO) culture positive    Urine retention    UTI (urinary tract infection)     Assessment: 70 yo male admitted 07/05/2020 on apixaban for Afib. Pharmacy consulted to dose heparin with oral and peri tracheal bleeding. Minimal bleeding in mouth and around trach site - per RN possibly from patient biting lip. No other bleeding noted. Last dose apixaban 8/24 at 1430 (doses on 8/24 PM held per MD).   8/30 afternoon:  Heparin level of 0.43 Hgb 8.1 (stable)  Goal of Therapy:  Heparin level 0.3-0.7 units/mL Monitor platelets by anticoagulation protocol: Yes   Plan:  Continue heparin 1950 units/hr HL 8/30'@2230'$   Daily HL and CBC F/u plan to transition to [place] once procedure plan determined  [name], PharmD Clinical Pharmacist  Please check AMION for all [place] numbers After 10:00 PM, call [place] [phone]

## 2020-09-11 NOTE — Progress Notes (Signed)
[place] for Heparin Indication: atrial fibrillation while apixaban on hold  Allergies  Allergen Reactions   Adhesive [Tape] Rash    Patient Measurements: Height: '5\' 6"'$  (167.6 cm) Weight: 82.7 kg (182 lb 5.1 oz) (Medical equipment on bed) IBW/kg (Calculated) : 63.8 Heparin Dosing Weight: 74.6 kg   Vital Signs: Temp: 98.8 F (37.1 C) (08/30 2000) Temp Source: Oral (08/30 2000) BP: 100/72 (08/30 2300) Pulse Rate: 87 (08/30 2300)  Labs: Recent Labs    09/09/20 0417 09/09/20 1702 08/26/2020 0351 09/11/20 0312 09/11/20 1430 09/11/20 2319  HGB 8.4*  --  8.3* 8.1*  --   --   HCT 26.6*  --  25.7* 25.7*  --   --   PLT 498*  --  451* 446*  --   --   APTT 58*  --   --   --   --   --   HEPARINUNFRC 0.25*   < >  --  0.20* 0.43 0.26*  CREATININE  --   --  1.32* 1.57*  --   --    < > = values in this interval not displayed.     Estimated Creatinine Clearance: 44.2 mL/min (A) (by C-G formula based on SCr of 1.57 mg/dL (H)).   Medical History: Past Medical History:  Diagnosis Date   Anemia    Anxiety    Chronic respiratory failure (HCC)    DM2 (diabetes mellitus, type 2) (HCC)    Functional quadriplegia (HCC)    Hypertension    Multiple drug resistant organism (MDRO) culture positive    Urine retention    UTI (urinary tract infection)     Assessment: 70 yo male admitted 07/05/2020 on apixaban for Afib. Pharmacy consulted to dose heparin with oral and peri tracheal bleeding. Minimal bleeding in mouth and around trach site - per RN possibly from patient biting lip. No other bleeding noted. Last dose apixaban 8/24 at 1430 (doses on 8/24 PM held per MD).   Heparin level of 0.24 is subtherapeutic after rate increase to heparin 1700 units/hr. Hgb 8.3. Plt wnl. Per RN no issues with IV access or infusion. Per RN, no bleeding noted around trach or in mouth this morning. No other bleeding noted.    8/30 PM update: Heparin level  sub-therapeutic   Goal of Therapy:  Heparin level 0.3-0.7 units/mL Monitor platelets by anticoagulation protocol: Yes   Plan:  Increase heparin to 2100 units/hr 0800 heparin level  Narda Bonds, PharmD, BCPS Clinical Pharmacist Phone: [phone]

## 2020-09-11 NOTE — Progress Notes (Signed)
[place] for Heparin Indication: atrial fibrillation while apixaban on hold  Allergies  Allergen Reactions   Adhesive [Tape] Rash    Patient Measurements: Height: '5\' 6"'$  (167.6 cm) Weight: 78.5 kg (173 lb 1 oz) IBW/kg (Calculated) : 63.8 Heparin Dosing Weight: 74.6 kg   Vital Signs: Temp: 98.7 F (37.1 C) (08/30 0000) Temp Source: Oral (08/30 0000) BP: 128/83 (08/30 0200) Pulse Rate: 86 (08/30 0200)  Labs: Recent Labs    09/08/20 1400 09/09/20 0417 09/09/20 0417 09/09/20 1702 09/09/2020 0349 08/30/2020 0351 09/11/20 0312  HGB  --  8.4*   < >  --   --  8.3* 8.1*  HCT  --  26.6*  --   --   --  25.7* 25.7*  PLT  --  498*  --   --   --  451* 446*  APTT 103* 58*  --   --   --   --   --   HEPARINUNFRC  --  0.25*   < > 0.24* 0.24*  --  0.20*  CREATININE  --   --   --   --   --  1.32*  --    < > = values in this interval not displayed.     Estimated Creatinine Clearance: 51.3 mL/min (A) (by C-G formula based on SCr of 1.32 mg/dL (H)).   Medical History: Past Medical History:  Diagnosis Date   Anemia    Anxiety    Chronic respiratory failure (HCC)    DM2 (diabetes mellitus, type 2) (HCC)    Functional quadriplegia (HCC)    Hypertension    Multiple drug resistant organism (MDRO) culture positive    Urine retention    UTI (urinary tract infection)     Assessment: 70 yo male admitted 06/24/2020 on apixaban for Afib. Pharmacy consulted to dose heparin with oral and peri tracheal bleeding. Minimal bleeding in mouth and around trach site - per RN possibly from patient biting lip. No other bleeding noted. Last dose apixaban 8/24 at 1430 (doses on 8/24 PM held per MD).   Heparin level of 0.24 is subtherapeutic after rate increase to heparin 1700 units/hr. Hgb 8.3. Plt wnl. Per RN no issues with IV access or infusion. Per RN, no bleeding noted around trach or in mouth this morning. No other bleeding noted.    8/30 AM update: Heparin  level sub-therapeutic after re-start Hgb low but stable  Goal of Therapy:  Heparin level 0.3-0.7 units/mL Monitor platelets by anticoagulation protocol: Yes   Plan:  Increase heparin to 1950 units/hr 1200 heparin level  Narda Bonds, PharmD, BCPS Clinical Pharmacist Phone: [phone]

## 2020-09-12 ENCOUNTER — Encounter (HOSPITAL_COMMUNITY): Payer: Self-pay | Admitting: Anesthesiology

## 2020-09-12 ENCOUNTER — Encounter (HOSPITAL_COMMUNITY): Admission: EM | Disposition: E | Payer: Self-pay | Source: Home / Self Care | Attending: Internal Medicine

## 2020-09-12 DIAGNOSIS — G8250 Quadriplegia, unspecified: Secondary | ICD-10-CM | POA: Diagnosis not present

## 2020-09-12 DIAGNOSIS — A419 Sepsis, unspecified organism: Secondary | ICD-10-CM | POA: Diagnosis not present

## 2020-09-12 DIAGNOSIS — Z9911 Dependence on respirator [ventilator] status: Secondary | ICD-10-CM | POA: Diagnosis not present

## 2020-09-12 DIAGNOSIS — A4159 Other Gram-negative sepsis: Secondary | ICD-10-CM | POA: Diagnosis not present

## 2020-09-12 LAB — CBC
HCT: 25.2 % — ABNORMAL LOW (ref 39.0–52.0)
Hemoglobin: 7.9 g/dL — ABNORMAL LOW (ref 13.0–17.0)
MCH: 25.6 pg — ABNORMAL LOW (ref 26.0–34.0)
MCHC: 31.3 g/dL (ref 30.0–36.0)
MCV: 81.6 fL (ref 80.0–100.0)
Platelets: 427 10*3/uL — ABNORMAL HIGH (ref 150–400)
RBC: 3.09 MIL/uL — ABNORMAL LOW (ref 4.22–5.81)
RDW: 19.4 % — ABNORMAL HIGH (ref 11.5–15.5)
WBC: 15.7 10*3/uL — ABNORMAL HIGH (ref 4.0–10.5)
nRBC: 0.4 % — ABNORMAL HIGH (ref 0.0–0.2)

## 2020-09-12 LAB — GLUCOSE, CAPILLARY
Glucose-Capillary: 106 mg/dL — ABNORMAL HIGH (ref 70–99)
Glucose-Capillary: 85 mg/dL (ref 70–99)
Glucose-Capillary: 84 mg/dL (ref 70–99)
Glucose-Capillary: 81 mg/dL (ref 70–99)
Glucose-Capillary: 94 mg/dL (ref 70–99)

## 2020-09-12 LAB — HEPARIN LEVEL (UNFRACTIONATED)
Heparin Unfractionated: >1.1 IU/mL — ABNORMAL HIGH (ref 0.30–0.70)
Heparin Unfractionated: 0.37 IU/mL (ref 0.30–0.70)
Heparin Unfractionated: 0.34 IU/mL (ref 0.30–0.70)

## 2020-09-12 SURGERY — CANCELLED PROCEDURE
Laterality: Left

## 2020-09-12 MED ORDER — FENTANYL CITRATE (PF) 250 MCG/5ML IJ SOLN
INTRAMUSCULAR | Status: AC
  Filled 2020-09-12: qty 5

## 2020-09-12 MED ORDER — PROPOFOL 10 MG/ML IV BOLUS
INTRAVENOUS | Status: AC
  Filled 2020-09-12: qty 20

## 2020-09-12 MED ORDER — MIDAZOLAM HCL 2 MG/2ML IJ SOLN
INTRAMUSCULAR | Status: AC
  Filled 2020-09-12: qty 2

## 2020-09-12 MED ORDER — ALTEPLASE 2 MG IJ SOLR
2.0000 mg | Freq: Once | INTRAMUSCULAR | Status: AC
  Administered 2020-09-12: 2 mg
  Filled 2020-09-12: qty 2

## 2020-09-12 NOTE — Progress Notes (Signed)
Assisted tele visit to patient with wife.  Thomas, Annaliz Aven Renee, RN   

## 2020-09-12 NOTE — Anesthesia Preprocedure Evaluation (Deleted)
  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 on medications (-) angina+ dysrhythmias Atrial Fibrillation  Rhythm:Regular Rate:Normal  07/2020 ECHO: right ventricle appears moderately enlarged with reduced function. Left ventricular EF 50 to 55%. The LV has low normal function, no significant valvular abnormalities   Neuro/Psych Anxiety    GI/Hepatic Neg liver ROS, GERD  Medicated,G-tube   Endo/Other  diabetes, Insulin Dependent  Renal/GU Renal InsufficiencyRenal disease (creat 1.32)     Musculoskeletal   Abdominal (+) - obese,   Peds  Hematology  (+) Blood dyscrasia anemia , xarelto   Anesthesia Other Findings   Reproductive/Obstetrics                           Anesthesia Physical Anesthesia Plan  ASA: 4  Anesthesia Plan: General   Post-op Pain Management:    Induction: Inhalational  PONV Risk Score and Plan: 2 and Ondansetron and Dexamethasone  Airway Management Planned: Tracheostomy  Additional Equipment: None  Intra-op Plan:   Post-operative Plan: Post-operative intubation/ventilation  Informed Consent: I have reviewed the patients History and Physical, chart, labs and discussed the procedure including the risks, benefits and alternatives for the proposed anesthesia with the patient or authorized representative who has indicated his/her understanding and acceptance.   Patient has  DNR.  Discussed DNR with power of attorney and Suspend DNR.   History available from chart only and Consent reviewed with POA  Plan Discussed with: Surgeon and CRNA  Anesthesia Plan Comments: (Discussed with patient, discussed with wife by telephone)      Anesthesia Quick Evaluation

## 2020-09-12 NOTE — Progress Notes (Addendum)
[facility] for Heparin Indication: atrial fibrillation while apixaban on hold  Allergies  Allergen Reactions   Adhesive [Tape] Rash    Patient Measurements: Height: '5\' 6"'$  (167.6 cm) Weight: 82.5 kg (181 lb 14.1 oz) IBW/kg (Calculated) : 63.8 Heparin Dosing Weight: 74.6 kg   Vital Signs: Temp: 99.6 F (37.6 C) (08/31 1100) Temp Source: Oral (08/31 1100) BP: 92/65 (08/31 1000) Pulse Rate: 86 (08/31 1000)  Labs: Recent Labs    09/07/2020 0351 09/11/20 0312 09/11/20 1430 09/11/20 2319 08/28/2020 0356 09/09/2020 0845 09/03/2020 0934  HGB 8.3* 8.1*  --   --  7.9*  --   --   HCT 25.7* 25.7*  --   --  25.2*  --   --   PLT 451* 446*  --   --  427*  --   --   HEPARINUNFRC  --  0.20*   < > 0.26*  --  >1.10* 0.37  CREATININE 1.32* 1.57*  --   --   --   --   --    < > = values in this interval not displayed.    Estimated Creatinine Clearance: 44.2 mL/min (A) (by C-G formula based on SCr of 1.57 mg/dL (H)).   Medical History: Past Medical History:  Diagnosis Date   Anemia    Anxiety    Chronic respiratory failure (HCC)    DM2 (diabetes mellitus, type 2) (HCC)    Functional quadriplegia (HCC)    Hypertension    Multiple drug resistant organism (MDRO) culture positive    Urine retention    UTI (urinary tract infection)     Assessment: 70 yo male admitted 06/22/2020 on apixaban for Afib. Pharmacy consulted to dose heparin with oral and peri tracheal bleeding. Minimal bleeding in mouth and around trach site - per RN possibly from patient biting lip. No other bleeding noted. Last dose apixaban 8/24 at 1430 (doses on 8/24 PM held per MD).   8/31 '@0900'$  Heparin level >1.1 units/mL -ordered stat repeat level and having nurse draw from a different location to confirm level 8/31 '@1000'$ - 0.37 units/mL (repeat level in goal range)   Goal of Therapy:  Heparin level 0.3-0.7 units/mL Monitor platelets by anticoagulation protocol: Yes   Plan:   Continue current rate of 2100 units/hr Planning to return to OR for graft function this afternoon. Plan to restart at 2100 units/hr and obtain confirmatory HL 8 hours later upon resumption. May consider po anticoagulation once all procedures are complete  [name], PharmD Clinical Pharmacist  Please check AMION for all [place] numbers After 10:00 PM, call [place]    ADDENDUM:   Per nurse, heparin drip was not turned off for procedure.  Ordered confirmatory lab for 1800. May need to confirm rate during procedure.   [name]

## 2020-09-12 NOTE — Progress Notes (Deleted)
[place] for Heparin Indication: atrial fibrillation while apixaban on hold  Allergies  Allergen Reactions   Adhesive [Tape] Rash    Patient Measurements: Height: '5\' 6"'$  (167.6 cm) Weight: 82.5 kg (181 lb 14.1 oz) IBW/kg (Calculated) : 63.8 Heparin Dosing Weight: 74.6 kg   Vital Signs: Temp: 99.6 F (37.6 C) (08/31 2002) Temp Source: Axillary (08/31 2002) BP: 88/62 (08/31 1300) Pulse Rate: 80 (08/31 1400)  Labs: Recent Labs    09/08/2020 0351 09/11/20 0312 09/11/20 1430 09/09/2020 0356 08/24/2020 0845 08/25/2020 0934 08/21/2020 1800  HGB 8.3* 8.1*  --  7.9*  --   --   --   HCT 25.7* 25.7*  --  25.2*  --   --   --   PLT 451* 446*  --  427*  --   --   --   HEPARINUNFRC  --  0.20*   < >  --  >1.10* 0.37 0.34  CREATININE 1.32* 1.57*  --   --   --   --   --    < > = values in this interval not displayed.     Estimated Creatinine Clearance: 44.2 mL/min (A) (by C-G formula based on SCr of 1.57 mg/dL (H)).   Medical History: Past Medical History:  Diagnosis Date   Anemia    Anxiety    Chronic respiratory failure (HCC)    DM2 (diabetes mellitus, type 2) (HCC)    Functional quadriplegia (HCC)    Hypertension    Multiple drug resistant organism (MDRO) culture positive    Urine retention    UTI (urinary tract infection)     Assessment: 70 yo male admitted 06/28/2020 on apixaban for Afib. Pharmacy consulted to dose heparin with oral and peri tracheal bleeding. Minimal bleeding in mouth and around trach site - per RN possibly from patient biting lip. No other bleeding noted. Last dose apixaban 8/24 at 1430 (doses on 8/24 PM held per MD).   8/31 '@0900'$  Heparin level >1.1 units/mL. Repeat level drawn from a different location '@1000'$ - 0.37 units/mL. Patient then went for procedure. Per nurse, heparin drip was not turned off for procedure. '@1800'$  heparin level 0.34 units/mL.    Goal of Therapy:  Heparin level 0.3-0.7 units/mL Monitor platelets  by anticoagulation protocol: Yes   Plan:  - Continue current rate of 2100 units/hr - Repeat heparin levels daily and watch for s/s bleeding - F/u transitioning to PO anticoagulation once all procedures are complete   Thank you for allowing pharmacy to be a part of this patient's care.  Ardyth Harps, PharmD Clinical Pharmacist

## 2020-09-12 NOTE — Progress Notes (Signed)
Assisted tele visit to patient with family member.  Thomas, Luka Stohr Renee, RN   

## 2020-09-12 NOTE — Progress Notes (Signed)
Patient ID: Luis Orr, male   DOB: Jun 05, 1950, 70 y.o.   MRN: [id number] S: Became tachycardic while on HD yesterday limiting UF. O:BP 95/66   Pulse 87   Temp 99.6 F (37.6 C) (Oral)   Resp 20   Ht 5' 6"  (1.676 m)   Wt 82.5 kg   SpO2 97%   BMI 29.36 kg/m   Intake/Output Summary (Last 24 hours) at 09/11/2020 0913 Last data filed at 08/14/2020 0800 Gross per 24 hour  Intake 1795.18 ml  Output 1025 ml  Net 770.18 ml   Intake/Output: I/O last 3 completed shifts: In: 2471.4 [I.V.:661.4; NG/GT:1810] Out: 1025 [Other:825; Stool:200]  Intake/Output this shift:  Total I/O In: 42 [I.V.:42] Out: -  Weight change: 4.9 kg Gen:on vent via trach CVS:RRR Resp:ventilated BS bilaterally Abd:+BS, soft Ext:no edema, Left forearm AVG clotted.  Recent Labs  Lab 09/07/20 0528 08/21/2020 0351 09/11/20 0312  NA 130* 130* 131*  K 3.2* 4.0 3.9  CL 88* 93* 95*  CO2 24 24 20*  GLUCOSE 163* 84 169*  BUN 56* 72* 89*  CREATININE 1.18 1.32* 1.57*  ALBUMIN 1.7* 1.7* 1.7*  CALCIUM 9.3 9.4 9.0  PHOS 4.4 4.9* 6.4*  AST  --   --  28  ALT  --   --  15   Liver Function Tests: Recent Labs  Lab 09/07/20 0528 09/04/2020 0351 09/11/20 0312  AST  --   --  28  ALT  --   --  15  ALKPHOS  --   --  182*  BILITOT  --   --  0.8  PROT  --   --  6.9  ALBUMIN 1.7* 1.7* 1.7*   No results for input(s): LIPASE, AMYLASE in the last 168 hours. No results for input(s): AMMONIA in the last 168 hours. CBC: Recent Labs  Lab 09/08/20 0200 09/09/20 0417 08/24/2020 0351 09/11/20 0312 09/03/2020 0356  WBC 17.9* 16.7* 16.9* 14.6* 15.7*  NEUTROABS  --   --  12.0*  --   --   HGB 8.0* 8.4* 8.3* 8.1* 7.9*  HCT 24.6* 26.6* 25.7* 25.7* 25.2*  MCV 80.1 81.3 80.1 81.1 81.6  PLT 462* 498* 451* 446* 427*   Cardiac Enzymes: No results for input(s): CKTOTAL, CKMB, CKMBINDEX, TROPONINI in the last 168 hours. CBG: Recent Labs  Lab 09/11/20 1513 09/11/20 2003 09/11/20 2314 08/13/2020 0355 09/06/2020 0748  GLUCAP  138* 154* 149* 106* 85    Iron Studies: No results for input(s): IRON, TIBC, TRANSFERRIN, FERRITIN in the last 72 hours. Studies/Results: No results found.  amiodarone  200 mg Per Tube Daily   buPROPion  75 mg Per Tube BID   chlorhexidine gluconate (MEDLINE KIT)  15 mL Mouth Rinse BID   Chlorhexidine Gluconate Cloth  6 each Topical Daily   darbepoetin (ARANESP) injection - DIALYSIS  40 mcg Intravenous Q Tue-HD   feeding supplement (PROSource TF)  45 mL Per Tube TID   guaiFENesin  5 mL Per Tube BID   insulin aspart  0-9 Units Subcutaneous Q4H   insulin glargine-yfgn  25 Units Subcutaneous Daily   lidocaine  1 patch Transdermal Q24H   mouth rinse  15 mL Mouth Rinse 10 times per day   metaxalone  800 mg Per Tube TID   midodrine  5 mg Per Tube TID   multivitamin  1 tablet Per Tube QHS   pantoprazole sodium  40 mg Per Tube QHS   scopolamine  1 patch Transdermal Q72H   sertraline  100 mg Per Tube Daily   sodium bicarbonate  650 mg Per Tube Once   sodium chloride flush  10-40 mL Intracatheter Q12H    BMET    Component Value Date/Time   NA 131 (L) 09/11/2020 0312   K 3.9 09/11/2020 0312   CL 95 (L) 09/11/2020 0312   CO2 20 (L) 09/11/2020 0312   GLUCOSE 169 (H) 09/11/2020 0312   BUN 89 (H) 09/11/2020 0312   CREATININE 1.57 (H) 09/11/2020 0312   CALCIUM 9.0 09/11/2020 0312   GFRNONAA 47 (L) 09/11/2020 0312   GFRAA NOT CALCULATED 10/11/2019 0226   CBC    Component Value Date/Time   WBC 15.7 (H) 08/21/2020 0356   RBC 3.09 (L) 09/07/2020 0356   HGB 7.9 (L) 08/31/2020 0356   HCT 25.2 (L) 09/06/2020 0356   PLT 427 (H) 09/07/2020 0356   MCV 81.6 09/02/2020 0356   MCH 25.6 (L) 08/15/2020 0356   MCHC 31.3 08/17/2020 0356   RDW 19.4 (H) 09/01/2020 0356   LYMPHSABS 2.6 08/23/2020 0351   MONOABS 1.6 (H) 09/11/2020 0351   EOSABS 0.2 09/02/2020 0351   BASOSABS 0.1 09/05/2020 0351      Assessment/Plan:   AKI now ESRD - initiated on CRRT 07/18/20 and transitioned to IHD.  Has [facility]  but will require AVF/AVG for out of state LTC facility.  Continue with HD on TTS schedule for now. Septic shock/VDRF associated PNA.  Off antibiotics.   VDRF - s/p trach, plan per PCCM.  Remains on vent.  Quadriplegia - due to compressive myelopathy involving C3-C5. CKD-BMD stable Anemia - on ESA and follow H/H Acute metabolic encephalopathy - continue with supportive care Vascular access - had left forearm AVG placed 09/11/2020 but unfortunately has clotted.  VVS following and plan for declot vs new LUE AVG. Disposition - pt cannot return to [facility] now that he is ESRD and will need AVG placement before out of state SNF will accept.  Donetta Potts, MD [employer] [phone]

## 2020-09-12 NOTE — Progress Notes (Signed)
[place] for Heparin Indication: atrial fibrillation while apixaban on hold  Allergies  Allergen Reactions   Adhesive [Tape] Rash    Patient Measurements: Height: '5\' 6"'$  (167.6 cm) Weight: 82.5 kg (181 lb 14.1 oz) IBW/kg (Calculated) : 63.8 Heparin Dosing Weight: 74.6 kg   Vital Signs: Temp: 99.6 F (37.6 C) (08/31 2002) Temp Source: Axillary (08/31 2002) BP: 88/62 (08/31 1300) Pulse Rate: 80 (08/31 1400)  Labs: Recent Labs    08/23/2020 0351 09/11/20 0312 09/11/20 1430 08/23/2020 0356 08/13/2020 0845 09/08/2020 0934 08/15/2020 1800  HGB 8.3* 8.1*  --  7.9*  --   --   --   HCT 25.7* 25.7*  --  25.2*  --   --   --   PLT 451* 446*  --  427*  --   --   --   HEPARINUNFRC  --  0.20*   < >  --  >1.10* 0.37 0.34  CREATININE 1.32* 1.57*  --   --   --   --   --    < > = values in this interval not displayed.     Estimated Creatinine Clearance: 44.2 mL/min (A) (by C-G formula based on SCr of 1.57 mg/dL (H)).   Medical History: Past Medical History:  Diagnosis Date   Anemia    Anxiety    Chronic respiratory failure (HCC)    DM2 (diabetes mellitus, type 2) (HCC)    Functional quadriplegia (HCC)    Hypertension    Multiple drug resistant organism (MDRO) culture positive    Urine retention    UTI (urinary tract infection)     Assessment: 70 yo male admitted 06/25/2020 on apixaban for Afib. Pharmacy consulted to dose heparin with oral and peri tracheal bleeding. Minimal bleeding in mouth and around trach site - per RN possibly from patient biting lip. No other bleeding noted. Last dose apixaban 8/24 at 1430 (doses on 8/24 PM held per MD).   Heparin level remains within goal on 2100 units/hr.  Goal of Therapy:  Heparin level 0.3-0.7 units/mL Monitor platelets by anticoagulation protocol: Yes   Plan:  Continue heparin at current rate of 2100 units/hr Follow-up with AM labs.  [employer], Pharm.D., BCPS Clinical  Pharmacist  **Pharmacist phone directory can be found on [email or web address] listed under [place].  09/02/2020 8:45 PM

## 2020-09-12 NOTE — Progress Notes (Signed)
[facility] PROGRESS NOTE  Luis Orr ZOX:[id number] DOB: [birth year] DOA: 06/26/2020 PCP: Townsend Roger, MD  Status: Remains inpatient appropriate because:Hemodynamically unstable, Persistent severe electrolyte disturbances, Unsafe d/c plan, and Inpatient level of care appropriate due to severity of illness  Dispo: The patient is from: [facility] vent capable              Anticipated d/c is to: SNF Vent capable w/ access to in facility/stretcher based HD-a facility in [place] has been located in preauthorization in progress.              Patient currently is not medically stable to d/c.   Difficult to place patient Yes    Level of care: ICU  Code Status: Partial (no CPR and no defibrillation or cardioversion) Family Communication: [name] on 8/22.  Of note the wife is also the POA DVT prophylaxis: Eliquis COVID vaccination status: Unknown    HPI:  70 y.o. 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 Patient presented secondary to altered mental status and hypotension with recurrent pneumonia requiring vasopressor support and ICU admission.he has had multiple recent admissions for sepsis secondary to various infections including UTIs, pneumonias and bacteremia in setting of MDR Klebsiella, MRSA and pseudomonas. Most recent admission September 2021 in setting of sepsis due to MDR Klebsiella with right ureteral calculus s/p double J ureteral stent placement and treated with meropenem x 10days. He was discharged back to [facility].  ICU significant events: 6/29 admitted to ICU for septic shock on levo to maintain MAP>65, continued on vent support 6/29 Blood Cx>> staph species in 1 of 4 cultures drawn>> suspect contaminant 6/30 Off pressors 7/2 ID Consult for MDR acinetobacter in trach asp, Meropenem changed to unasyn 7/2 PRBC transfusion, iron replacement 7/5 Pressors added again. Central line placed.  Drainage from gastrostomy tube >> CT Abd without fluid collection around gastrostomy tube but with diffuse body wall edema. Echo EF 50-55%, mild LVH 7/6 Renal Replacement Therapy ; Code status changed to partial Code (DNR) 7/7 Transfuse 1U PRBC 7/9 add solu cortef 7/10 off pressors, weaning stress steroids 7/11 Pressors added back on again 7/12 ID consult due to rising WBC, hypothermia, hypotension concern for worsening infection. Vancomycin added. Repeat blood cx collected. 7/15 CRRT stopped 7/17 Restarted on low dose levo. No evidence of new infection. Held off on antibiotics. Nephrology contacted family and decision was made to continue iHD this week to allow additional time for renal recovery. Patient is not the best long term iHD candidate but family wants aggressive care 7/18 plans for iHD, tolerated with 2L removed 719 slight drop in hgb to 6.9 will transfuse 1 unit PRBC 7/22 > 7/26: Tolerating HD. TOC consulted for placement. Will likely need out of state placement. 8/15 WBCs greater than 28,000, hypoglycemia, procalcitonin >18, chest x-ray consistent with pneumonia, recent blood cultures consistent with contamination.  Unasyn IV resumed after sputum culture obtained 8/16 consultation placed with ethics committee regarding evaluation for futility of care 8/16 Sputum cx + for Acinetobacter with sensitivities pending 8/17 extensive conversation held with patient's wife regarding patient's poor prognosis and reemergence of Acinetobacter VAP.  Also discussed with her that patient has been able to convey to multiple staff that he does not want to continue on the ventilator or with dialysis.  Wife reports that the patient does not understand what he is saying, she is the POA and that she wants to continue aggressive care. 8/18 patient now  telling nephrology, myself and the rest of the staff he replies yes to wanting to remain on dialysis and on the ventilator and this direct question is  asked. 8/18 Acinetobacter sensitive to Unasyn only.  Discussed with ID/Dr. Juleen China.  Plan is to administer antibiotics for 7-day since patient has not had any increased O2 needs and remains afebrile.  If any concerns over worsening symptoms can give an additional 3 days. Developed moderate bleeding around tracheostomy site on 8/19.  Likely related to combination of Eliquis and heparin required for CRRT.  We will hold Eliquis for now and monitor degree of bleeding Eliquis resumed on 8/22.  Overnight into 8/23 patient bit his tongue and had bleeding.  Also had some bleeding around trach likely from oral bleeding.  Also though did have bleeding from old central line site. 8/23 had transient bleeding around trach and prior central line insertion site overnight.  Patient had bitten tongue and this likely explain the bleeding.  This morning patient was not having any bleeding.  On 8/25 had redeveloped slow ooze bleeding from mouth and trach.  Eliquis discontinued in favor of IV heparin especially given patient will undergo surgical procedure for vascular access on 8/29 8/30 Left FA AVG occluded-.VVS plans to place new access   Subjective: Alert and able to communicate that his throat feels better with the addition of Chloraseptic spray.  Objective: Vitals:   09/09/2020 0600 08/17/2020 0700  BP: 116/72 93/63  Pulse: 89 87  Resp: 20 20  Temp:    SpO2: 97% 98%    Intake/Output Summary (Last 24 hours) at 09/02/2020 0732 Last data filed at 09/11/2020 0600 Gross per 24 hour  Intake 1792.26 ml  Output 1025 ml  Net 767.26 ml   Filed Weights   09/11/20 1145 09/11/20 1515 09/01/2020 0500  Weight: 83.4 kg 82.7 kg 82.5 kg    Exam:  Constitutional: NAD, awake Respiratory: Trach: 6.0 XLT cuffed, Vent settings: PRVC mode, Fio2 40%, rate 20, VT 510, PEEP 5; lung sounds CTA.  Expiratory effort per vent (diaphragm paralyzed).   Cardiovascular: SR/ST, normotensive.  Focal dependent bilateral hand edema slowly  improving. Abdomen: LBM 8/31, PEG tube clogged.  Cortrack in place for feedings until back ordered feeding tube available. Left lower quadrant colostomy in place, abdomen soft and nondistended, normoactive bowel sounds. Neurologic: CN 2-12 grossly intact on visual inspection- patient with quadriplegia so unable to independently move extremities.  No definitive sensation of extremities when checked. Psychiatric: Awake and nods head appropriately to questions asked.     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 ID/Dr Juleen China recommended 7 days of therapy -completed on 8/22   AKI with oliguria/volume overload/ New CKD requiring HD Anuric Nephrology following, initially required CRRT and has transitioned to HD (due to staffing issues continues on CRRT at bedside) Palliative care has been consulted and are following.  TDC placed on 7/26-Left FA AVG graft placed but now occluded so VVS plans new access   Septic shock 2/2 VAP POA Presented with sepsis physiology and profound shock which has now resolved  Chronic thoracic back pain Continue Oxy and fentanyl prn  Better on scheduled Skelaxin and lidocaine patch  History of paroxysmal atrial fibrillation Patient was on amiodarone prior to admission -currently maintaining  Eliquis has been discontinued due to recurrent peritracheal and oral bleeding.  No further bleeding on IV heparin.  We will continue IV heparin until surgical procedure as outlined above completed TSH  was WNL this admission QTc 420 ms on 2/83   Acute metabolic encephalopathy Resolved Secondary to acute illness. CT head negative for acute process.  Back at baseline.   Diabetes mellitus, type 2 Currently on Semglee 25 units daily.  CBG is down in the 70s due to recent issues related to clogged PEG tube and inability to administer tube feeding.  Hold the 25 units today and give only 10 units for now. 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  Resolved. 8/23 total bilirubin, AST and ALT have all normalized  Quadriplegia secondary to compressive myelopathy.  Previously resided at [facility] but with poor performance scores as well as poor rehabilitation potential [facility] will not accept him back if he is HD dependent (he must be able to sit up in a chair for dialysis.)   Microcytic anemia/Acute anemia superimposed on anemia of chronic kidney disease Patient required 4 units of PRBC to date. Thought to be secondary to a combination of acute illness, chronic disease and iron deficiency. IV iron given.  Hemoglobin on 8/26 was 8.5 Continue Aranesp   Severe protein calorie malnutrition Nutrition Problem: Increased nutrient needs Etiology: wound healing Signs/Symptoms: estimated needs Interventions: Tube feeding, Prostat, MVI Estimated body mass index is 29.36 kg/m as calculated from the following:   Height as of this encounter: $RemoveBeforeD'5\' 6"'[id number]$  (1.676 m).   Weight as of this encounter: 82.5 kg.  PEG tube unable to be declogged successfully.  Feeding tube on backorder.  Continue Cortrak for feedings and meds. Patient with sore throat and throat discomfort secondary to cortrack tube.  Chloraseptic Spray ordered.  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  Right/posterior/lateral ankle unknown if present on admission.  DTI left ear, not present on admission. (For additional documentation see wound care notes) 8/24 nursing states that stage IV decubitus on the sacrum is looking worse.  Currently has normal saline dressings every shift.  Wound care team contacted to reevaluate wound.  I suspect patient will need enzymatic treatment along with hydrotherapy.  I have asked wound care to take pictures to document appearance of wound.    Data Reviewed: Basic Metabolic Panel: Recent Labs  Lab 09/07/20 0528 09/12/2020 0351 09/11/20 0312  NA 130* 130* 131*  K 3.2* 4.0 3.9  CL 88* 93* 95*  CO2 24 24 20*  GLUCOSE 163* 84 169*  BUN 56* 72* 89*  CREATININE 1.18 1.32* 1.57*  CALCIUM 9.3 9.4 9.0  MG  --   --  2.4  PHOS 4.4 4.9* 6.4*   Liver Function Tests: Recent Labs  Lab 09/07/20 0528 09/07/2020 0351 09/11/20 0312  AST  --   --  28  ALT  --   --  15  ALKPHOS  --   --  182*  BILITOT  --   --  0.8  PROT  --   --  6.9  ALBUMIN 1.7* 1.7* 1.7*  No results for input(s): LIPASE, AMYLASE in the last 168 hours. No results for input(s): AMMONIA in the last 168 hours. CBC: Recent Labs  Lab 09/08/20 0200 09/09/20 0417 08/28/2020 0351 09/11/20 0312 08/23/2020 0356  WBC 17.9* 16.7* 16.9* 14.6* 15.7*  NEUTROABS  --   --  12.0*  --   --   HGB 8.0* 8.4* 8.3* 8.1* 7.9*  HCT 24.6* 26.6* 25.7* 25.7* 25.2*  MCV 80.1 81.3 80.1 81.1 81.6  PLT 462* 498* 451* 446* 427*   Cardiac Enzymes: No results for input(s): CKTOTAL, CKMB, CKMBINDEX, TROPONINI in the last 168 hours. BNP (last 3 results) Recent Labs    06/25/2020 1922  BNP 135.0*    ProBNP (last 3 results) No results for input(s): PROBNP in the last 8760 hours.  CBG: Recent Labs  Lab 09/11/20 1103 09/11/20 1513 09/11/20 2003 09/11/20 2314 08/17/2020 0355  GLUCAP 206* 138* 154* 149* 106*       Studies: No results  found.  Scheduled Meds:  amiodarone  200 mg Per Tube Daily   buPROPion  75 mg Per Tube BID   chlorhexidine gluconate (MEDLINE KIT)  15 mL Mouth Rinse BID   Chlorhexidine Gluconate Cloth  6 each Topical Daily   darbepoetin (ARANESP) injection - DIALYSIS  40 mcg Intravenous Q Tue-HD   feeding supplement (PROSource TF)  45 mL Per Tube TID   guaiFENesin  5 mL Per Tube BID   insulin aspart  0-9 Units Subcutaneous Q4H   insulin glargine-yfgn  25 Units Subcutaneous Daily   lidocaine  1 patch Transdermal Q24H   mouth rinse  15 mL Mouth Rinse 10 times per day   metaxalone  800 mg Per Tube TID   midodrine  5 mg Per Tube TID   multivitamin  1 tablet Per Tube QHS   pantoprazole sodium  40 mg Per Tube QHS   scopolamine  1 patch Transdermal Q72H   sertraline  100 mg Per Tube Daily   sodium bicarbonate  650 mg Per Tube Once   sodium chloride flush  10-40 mL Intracatheter Q12H   Continuous Infusions:  sodium chloride Stopped (08/20/2020 1308)   sodium chloride 500 mL (09/01/20 1408)   sodium chloride     sodium chloride     sodium chloride     feeding supplement (NEPRO CARB STEADY) Stopped (08/20/2020 0045)   heparin 2,100 Units/hr (09/11/2020 0600)    Active Problems:   Ventilator dependent ([place])   Septic shock (HCC)   Pressure injury of skin   Acute respiratory failure with hypoxia (HCC)   AKI (acute kidney injury) ([place])   Hypotension   Goals of care, counseling/discussion   Quadriplegia ([place])   Chronic kidney disease requiring chronic dialysis ([place])   Hyperglycemia   Sepsis due to Acinetobacter baumannii ([place])   Pneumonia due to Acinetobacter species ([place])   Dysautonomia ([place])   Chronic midline thoracic back pain   Consultants: PCCM Nephrology Infectious disease Palliative care medicine  Procedures: Echocardiogram Insertion of double-lumen HD catheter  Antibiotics: Cefepime x1 dose Meropenem 6/29 through 7/2 Vancomycin 6/29 through 6/30 Unasyn 7/2 through 7/15 Unasyn  8/15 >   Time spent: 35 minutes    Erin Hearing ANP  [facility] 7 am - 330 pm/M-F for direct patient care and secure chat Please refer to Amion for contact info 63  days

## 2020-09-13 DIAGNOSIS — A419 Sepsis, unspecified organism: Secondary | ICD-10-CM | POA: Diagnosis not present

## 2020-09-13 DIAGNOSIS — A4159 Other Gram-negative sepsis: Secondary | ICD-10-CM | POA: Diagnosis not present

## 2020-09-13 DIAGNOSIS — G8250 Quadriplegia, unspecified: Secondary | ICD-10-CM | POA: Diagnosis not present

## 2020-09-13 DIAGNOSIS — Z9911 Dependence on respirator [ventilator] status: Secondary | ICD-10-CM | POA: Diagnosis not present

## 2020-09-13 LAB — CBC
HCT: 24.3 % — ABNORMAL LOW (ref 39.0–52.0)
Hemoglobin: 7.6 g/dL — ABNORMAL LOW (ref 13.0–17.0)
MCH: 25.5 pg — ABNORMAL LOW (ref 26.0–34.0)
MCHC: 31.3 g/dL (ref 30.0–36.0)
MCV: 81.5 fL (ref 80.0–100.0)
Platelets: 419 10*3/uL — ABNORMAL HIGH (ref 150–400)
RBC: 2.98 MIL/uL — ABNORMAL LOW (ref 4.22–5.81)
RDW: 19.5 % — ABNORMAL HIGH (ref 11.5–15.5)
WBC: 16.1 10*3/uL — ABNORMAL HIGH (ref 4.0–10.5)
nRBC: 0.4 % — ABNORMAL HIGH (ref 0.0–0.2)

## 2020-09-13 LAB — RENAL FUNCTION PANEL
Albumin: 1.6 g/dL — ABNORMAL LOW (ref 3.5–5.0)
Anion gap: 10 (ref 5–15)
BUN: 62 mg/dL — ABNORMAL HIGH (ref 8–23)
CO2: 25 mmol/L (ref 22–32)
Calcium: 8.8 mg/dL — ABNORMAL LOW (ref 8.9–10.3)
Chloride: 97 mmol/L — ABNORMAL LOW (ref 98–111)
Creatinine, Ser: 1.24 mg/dL (ref 0.61–1.24)
GFR, Estimated: >60 mL/min (ref >60)
Glucose, Bld: 117 mg/dL — ABNORMAL HIGH (ref 70–99)
Phosphorus: 5.2 mg/dL — ABNORMAL HIGH (ref 2.5–4.6)
Potassium: 4 mmol/L (ref 3.5–5.1)
Sodium: 132 mmol/L — ABNORMAL LOW (ref 135–145)

## 2020-09-13 LAB — GLUCOSE, CAPILLARY
Glucose-Capillary: 106 mg/dL — ABNORMAL HIGH (ref 70–99)
Glucose-Capillary: 111 mg/dL — ABNORMAL HIGH (ref 70–99)
Glucose-Capillary: 122 mg/dL — ABNORMAL HIGH (ref 70–99)
Glucose-Capillary: 144 mg/dL — ABNORMAL HIGH (ref 70–99)
Glucose-Capillary: 155 mg/dL — ABNORMAL HIGH (ref 70–99)
Glucose-Capillary: 162 mg/dL — ABNORMAL HIGH (ref 70–99)
Glucose-Capillary: 214 mg/dL — ABNORMAL HIGH (ref 70–99)

## 2020-09-13 LAB — HEPARIN LEVEL (UNFRACTIONATED): Heparin Unfractionated: 0.35 IU/mL (ref 0.30–0.70)

## 2020-09-13 MED ORDER — HEPARIN SODIUM (PORCINE) 1000 UNIT/ML IJ SOLN
3800.0000 [IU] | INTRAMUSCULAR | Status: DC | PRN
  Administered 2020-09-13 – 2020-10-18 (×6): 3800 [IU] via INTRAVENOUS
  Filled 2020-09-13 (×4): qty 4

## 2020-09-13 NOTE — H&P (View-Only) (Signed)
Visited with pt's wife and son today at bedside.    Discussed with them that the graft placed has clotted off.  Discussed we will take him back to the OR tomorrow to see if we can declot this graft or if he will need a new upper arm graft.  Discussed with family that the new graft may clot again.  I also discussed with them that his veins are small and he is not a candidate for a fistula.    They expressed understanding and questions were answered.   Pt will be npo after MN, labs in am and consent to be signed.   Leontine Locket, [facility] 09/13/2020 12:17 PM

## 2020-09-13 NOTE — Progress Notes (Addendum)
Patient ID: Luis Orr, male   DOB: 1950/07/06, 70 y.o.   MRN: [id number] S: Seen while on HD. O:BP 122/85   Pulse (!) 111   Temp 98.4 F (36.9 C) (Oral)   Resp 20   Ht 5' 6"  (1.676 m)   Wt 79 kg   SpO2 95%   BMI 28.11 kg/m   Intake/Output Summary (Last 24 hours) at 09/13/2020 1050 Last data filed at 09/13/2020 0800 Gross per 24 hour  Intake 1236.14 ml  Output 400 ml  Net 836.14 ml   Intake/Output: I/O last 3 completed shifts: In: 1890.8 [I.V.:745.8; NG/GT:1145] Out: 500 [Stool:500]  Intake/Output this shift:  Total I/O In: 21 [I.V.:21] Out: -  Weight change: -3.9 kg Gen:on vent via trach MWN:UUVOZ at 111 Resp:ventilated bs bilaterally Abd:+BS, soft, NT/ND Ext: no edema  Recent Labs  Lab 09/07/20 0528 08/13/2020 0351 09/11/20 0312 09/13/20 0343  NA 130* 130* 131* 132*  K 3.2* 4.0 3.9 4.0  CL 88* 93* 95* 97*  CO2 24 24 20* 25  GLUCOSE 163* 84 169* 117*  BUN 56* 72* 89* 62*  CREATININE 1.18 1.32* 1.57* 1.24  ALBUMIN 1.7* 1.7* 1.7* 1.6*  CALCIUM 9.3 9.4 9.0 8.8*  PHOS 4.4 4.9* 6.4* 5.2*  AST  --   --  28  --   ALT  --   --  15  --    Liver Function Tests: Recent Labs  Lab 08/22/2020 0351 09/11/20 0312 09/13/20 0343  AST  --  28  --   ALT  --  15  --   ALKPHOS  --  182*  --   BILITOT  --  0.8  --   PROT  --  6.9  --   ALBUMIN 1.7* 1.7* 1.6*   No results for input(s): LIPASE, AMYLASE in the last 168 hours. No results for input(s): AMMONIA in the last 168 hours. CBC: Recent Labs  Lab 09/09/20 0417 08/21/2020 0351 09/11/20 0312 08/27/2020 0356 09/13/20 0343  WBC 16.7* 16.9* 14.6* 15.7* 16.1*  NEUTROABS  --  12.0*  --   --   --   HGB 8.4* 8.3* 8.1* 7.9* 7.6*  HCT 26.6* 25.7* 25.7* 25.2* 24.3*  MCV 81.3 80.1 81.1 81.6 81.5  PLT 498* 451* 446* 427* 419*   Cardiac Enzymes: No results for input(s): CKTOTAL, CKMB, CKMBINDEX, TROPONINI in the last 168 hours. CBG: Recent Labs  Lab 08/25/2020 1613 09/11/2020 2003 08/27/2020 2358 09/13/20 0348  09/13/20 0810  GLUCAP 81 94 106* 111* 122*    Iron Studies: No results for input(s): IRON, TIBC, TRANSFERRIN, FERRITIN in the last 72 hours. Studies/Results: No results found.  amiodarone  200 mg Per Tube Daily   buPROPion  75 mg Per Tube BID   chlorhexidine gluconate (MEDLINE KIT)  15 mL Mouth Rinse BID   Chlorhexidine Gluconate Cloth  6 each Topical Daily   darbepoetin (ARANESP) injection - DIALYSIS  40 mcg Intravenous Q Tue-HD   feeding supplement (PROSource TF)  45 mL Per Tube TID   guaiFENesin  5 mL Per Tube BID   insulin aspart  0-9 Units Subcutaneous Q4H   insulin glargine-yfgn  25 Units Subcutaneous Daily   lidocaine  1 patch Transdermal Q24H   mouth rinse  15 mL Mouth Rinse 10 times per day   metaxalone  800 mg Per Tube TID   midodrine  5 mg Per Tube TID   multivitamin  1 tablet Per Tube QHS   pantoprazole sodium  40 mg Per  Tube QHS   scopolamine  1 patch Transdermal Q72H   sertraline  100 mg Per Tube Daily   sodium bicarbonate  650 mg Per Tube Once   sodium chloride flush  10-40 mL Intracatheter Q12H    BMET    Component Value Date/Time   NA 132 (L) 09/13/2020 0343   K 4.0 09/13/2020 0343   CL 97 (L) 09/13/2020 0343   CO2 25 09/13/2020 0343   GLUCOSE 117 (H) 09/13/2020 0343   BUN 62 (H) 09/13/2020 0343   CREATININE 1.24 09/13/2020 0343   CALCIUM 8.8 (L) 09/13/2020 0343   GFRNONAA >60 09/13/2020 0343   GFRAA NOT CALCULATED 10/11/2019 0226   CBC    Component Value Date/Time   WBC 16.1 (H) 09/13/2020 0343   RBC 2.98 (L) 09/13/2020 0343   HGB 7.6 (L) 09/13/2020 0343   HCT 24.3 (L) 09/13/2020 0343   PLT 419 (H) 09/13/2020 0343   MCV 81.5 09/13/2020 0343   MCH 25.5 (L) 09/13/2020 0343   MCHC 31.3 09/13/2020 0343   RDW 19.5 (H) 09/13/2020 0343   LYMPHSABS 2.6 08/27/2020 0351   MONOABS 1.6 (H) 09/11/2020 0351   EOSABS 0.2 08/14/2020 0351   BASOSABS 0.1 08/24/2020 0351      Assessment/Plan:   AKI now ESRD - initiated on CRRT 07/18/20 and transitioned to  IHD.  Has [facility] but will require AVF/AVG for out of state LTC facility.  Continue with HD on TTS schedule for now. Septic shock/VDRF associated PNA.  Off antibiotics.   VDRF - s/p trach, plan per PCCM.  Remains on vent.  Quadriplegia - due to compressive myelopathy involving C3-C5. CKD-BMD stable Anemia - on ESA and follow H/H Acute metabolic encephalopathy - continue with supportive care Vascular access - had left forearm AVG placed 09/06/2020 but unfortunately has clotted.  VVS following and plan for declot vs new LUE AVG. Disposition - pt cannot return to [facility] now that he is ESRD and will need AVG placement before out of state SNF will accept.  He is not tolerating HD well and has had episodes of tachycardia and hypotension on the last 2 treatments.  May need to discuss goals of care again with family/guardian/HCPOA.  Donetta Potts, MD [employer] [phone]

## 2020-09-13 NOTE — TOC Progression Note (Addendum)
Transition of Care [facility]) - Progression Note    Patient Details  Name: Luis Orr MRN: [id number] Date of Birth: 1950-04-21  Transition of Care [facility]) CM/SW [place], RN Phone Number: 09/13/2020, 10:24 AM  Clinical Narrative:    Case management spoke with the patient's wife, Luis Orr this morning and she will bring financial documents to the unit to have the unit secretary make copies and leave them for me to send to [facility] in [place] to assist with [place] Medicaid application.    The wife states that her sons will be visiting today and she wanted permission to visit at bedside as well.  I spoke with the RN Charge nurse on the unit and asked if visitation exception could be made since the patient's family was from out of town.  Charge RN to speak with the bedside nurse and contact the wife regarding this matter.  CM called and spoke with Luetta Nutting, CM at [facility] to followup on needs for future placement at the facility.  09/13/2020 1154 - CM met with the patient's wife and two sons in the waiting room.  The wife brough financial documents to send to [facility].  I sent [facility] drivers license, Medicaid card, birth certificate and other documents and faxed them to [facility] to fax#[phone] and secure email to [email or web address].  CM and MSW with DTP Team will continue to follow the patient for SNF placement at [facility] in [place], [place] that offers ventilatory support and HD at the facility.  Patient will need [place] Medicaid approval before transfer to the facility.   Expected Discharge Plan: [place] Barriers to Discharge: Continued Medical Work up, No SNF bed, Vent Bed not available  Expected Discharge Plan and Services Expected Discharge Plan: [place]   Discharge Planning Services: CM Consult Post Acute Care Choice: [place] Living arrangements for  the past 2 months: Post-Acute Facility                                       Social Determinants of Health (SDOH) Interventions    Readmission Risk Interventions No flowsheet data found.

## 2020-09-13 NOTE — Progress Notes (Signed)
Nutrition Follow-up  DOCUMENTATION CODES:   Not applicable  INTERVENTION:   Continue tube feeds via Cortrak tube until G-tube is replaced: - Nepro @ 45 ml/hr (1080 ml/day) - ProSource TF 45 ml TID  Tube feeding regimen provides 2064 kcal, 120 grams of protein, and 785 ml free water daily.  - Continue renal MVI daily  NUTRITION DIAGNOSIS:   Increased nutrient needs related to wound healing as evidenced by estimated needs.  Ongoing  GOAL:   Patient will meet greater than or equal to 90% of their needs  Met via TF  MONITOR:   Vent status, Labs, Weight trends, TF tolerance, Skin, I & O's  REASON FOR ASSESSMENT:   Ventilator, Consult Enteral/tube feeding initiation and management  ASSESSMENT:   70 year old male who presented to the ED on 6/29 from [facility] with AMS and hypotension. 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 Pt admitted with septic shock, acute on chronic hypoxemic respiratory failure.  Cortrak in place with tip postpyloric in the distal duodenum.  Patient is currently tolerating Nepro at 45 ml/h with Prosource TF 45 ml TID.   AVG was placed 8/29, but clotted.  Plans for new AVG tomorrow.  Currently receiving iHD on TTS schedule via TDC.  He has not been tolerating HD well with episodes of tachycardia and hypotension.   Plans for D/C to SNF in [place] when medically stable.  Admit weight: 90.2 kg Current weight: 79 kg Lowest weight since admit: 72 kg on 8/3  Patient remains on ventilator support via trach MV: 9.9 L/min Temp (24hrs), Avg:98.5 F (36.9 C), Min:98.1 F (36.7 C), Max:99.6 F (37.6 C)  Medications reviewed and include: aranesp weekly, Novolog SSI q 4 hours, semglee 25 units daily, rena-vit, protonix.  Labs reviewed: Na 132, phos 5.2 CBG:  [phone]  Colostomy: 400 ml x 24 hours I/O's: +6.36 L since admit  Diet Order:   Diet Order             Diet NPO time specified Except for: Sips with Meds  Diet effective midnight                   EDUCATION NEEDS:   No education needs have been identified at this time  Skin:  Skin Assessment: Skin Integrity Issues: DTI: L ear Stage IV: sacrum Stage III: R heel  Last BM:  9/1 colostomy  Height:   Ht Readings from Last 1 Encounters:  09/06/20 5' 6"  (1.676 m)    Weight:   Wt Readings from Last 1 Encounters:  09/13/20 79 kg    Ideal Body Weight:  58 kg (adjusted for quadriplegia)  BMI:  Body mass index is 28.11 kg/m.  Estimated Nutritional Needs:   Kcal:  1900-2100  Protein:  110-120 gm  Fluid:  1 L + UOP   Lucas Mallow, RD, LDN, CNSC Please refer to Amion for contact information.

## 2020-09-13 NOTE — Progress Notes (Signed)
Dialysis access cancelled for today.  Will plan on tomorrow   Annamarie Major

## 2020-09-13 NOTE — Progress Notes (Signed)
[facility] PROGRESS NOTE  Luis Orr [id number] DOB: 1950-07-04 DOA: 06/16/2020 PCP: Townsend Roger, MD  Status: Remains inpatient appropriate because:Hemodynamically unstable, Persistent severe electrolyte disturbances, Unsafe d/c plan, and Inpatient level of care appropriate due to severity of illness  Dispo: The patient is from: [facility] vent capable              Anticipated d/c is to: SNF Vent capable w/ access to in facility/stretcher based HD-a facility in [place] has been located in preauthorization in progress.              Patient currently is not medically stable to d/c.   Difficult to place patient Yes    Level of care: ICU  Code Status: Partial (no CPR and no defibrillation or cardioversion) Family Communication: Wife June on 8/22.  Of note the wife is also the POA DVT prophylaxis: IV Heparin COVID vaccination status: Unknown    HPI:  70 y.o. 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 Patient presented secondary to altered mental status and hypotension with recurrent pneumonia requiring vasopressor support and ICU admission.he has had multiple recent admissions for sepsis secondary to various infections including UTIs, pneumonias and bacteremia in setting of MDR Klebsiella, MRSA and pseudomonas. Most recent admission September 2021 in setting of sepsis due to MDR Klebsiella with right ureteral calculus s/p double J ureteral stent placement and treated with meropenem x 10days. He was discharged back to [facility].  ICU significant events: 6/29 admitted to ICU for septic shock on levo to maintain MAP>65, continued on vent support 6/29 Blood Cx>> staph species in 1 of 4 cultures drawn>> suspect contaminant 6/30 Off pressors 7/2 ID Consult for MDR acinetobacter in trach asp, Meropenem changed to unasyn 7/2 PRBC transfusion, iron replacement 7/5 Pressors added again. Central line placed.  Drainage from gastrostomy tube >> CT Abd without fluid collection around gastrostomy tube but with diffuse body wall edema. Echo EF 50-55%, mild LVH 7/6 Renal Replacement Therapy ; Code status changed to partial Code (DNR) 7/7 Transfuse 1U PRBC 7/9 add solu cortef 7/10 off pressors, weaning stress steroids 7/11 Pressors added back on again 7/12 ID consult due to rising WBC, hypothermia, hypotension concern for worsening infection. Vancomycin added. Repeat blood cx collected. 7/15 CRRT stopped 7/17 Restarted on low dose levo. No evidence of new infection. Held off on antibiotics. Nephrology contacted family and decision was made to continue iHD this week to allow additional time for renal recovery. Patient is not the best long term iHD candidate but family wants aggressive care 7/18 plans for iHD, tolerated with 2L removed 719 slight drop in hgb to 6.9 will transfuse 1 unit PRBC 7/22 > 7/26: Tolerating HD. TOC consulted for placement. Will likely need out of state placement. 8/15 WBCs greater than 28,000, hypoglycemia, procalcitonin >18, chest x-ray consistent with pneumonia, recent blood cultures consistent with contamination.  Unasyn IV resumed after sputum culture obtained 8/16 consultation placed with ethics committee regarding evaluation for futility of care 8/16 Sputum cx + for Acinetobacter with sensitivities pending 8/17 extensive conversation held with patient's wife regarding patient's poor prognosis and reemergence of Acinetobacter VAP.  Also discussed with her that patient has been able to convey to multiple staff that he does not want to continue on the ventilator or with dialysis.  Wife reports that the patient does not understand what he is saying, she is the POA and that she wants to continue aggressive care. 8/18 patient  now telling nephrology, myself and the rest of the staff he replies yes to wanting to remain on dialysis and on the ventilator and this direct question is  asked. 8/18 Acinetobacter sensitive to Unasyn only.  Discussed with ID/Dr. Juleen China.  Plan is to administer antibiotics for 7-day since patient has not had any increased O2 needs and remains afebrile.  If any concerns over worsening symptoms can give an additional 3 days. Developed moderate bleeding around tracheostomy site on 8/19.  Likely related to combination of Eliquis and heparin required for CRRT.  We will hold Eliquis for now and monitor degree of bleeding Eliquis resumed on 8/22.  Overnight into 8/23 patient bit his tongue and had bleeding.  Also had some bleeding around trach likely from oral bleeding.  Also though did have bleeding from old central line site. 8/23 had transient bleeding around trach and prior central line insertion site overnight.  Patient had bitten tongue and this likely explain the bleeding.  This morning patient was not having any bleeding.  On 8/25 had redeveloped slow ooze bleeding from mouth and trach.  Eliquis discontinued in favor of IV heparin especially given patient will undergo surgical procedure for vascular access on 8/29 8/30 Left FA AVG occluded-.VVS plans to place new access   Subjective: Awake and currently receiving bedside dialysis.  Objective: Vitals:   09/13/20 0500 09/13/20 0600  BP: 120/69 117/70  Pulse: 82 82  Resp: 20 20  Temp:    SpO2: 99% 100%    Intake/Output Summary (Last 24 hours) at 09/13/2020 0732 Last data filed at 09/13/2020 0700 Gross per 24 hour  Intake 1257.14 ml  Output 400 ml  Net 857.14 ml   Filed Weights   09/11/20 1515 08/24/2020 0500 09/13/20 0500  Weight: 82.7 kg 82.5 kg 79.5 kg    Exam:  Constitutional: NAD, awake, calm Respiratory: Trach: 6.0 XLT cuffed, Vent settings: PRVC mode, Fio2 40%, rate 20, VT 510, PEEP 5; bilateral lung sounds were coarse to auscultation.  Expiratory effort per vent (diaphragm paralyzed).   Cardiovascular: SR/ST, normotensive.  Stable bilateral hand edema Abdomen: LBM 8/31, PEG tube  clogged.  Cortrack in place for feedings until back ordered feeding tube available. Left lower quadrant colostomy in place, abdomen soft and nondistended, normoactive bowel sounds. Neurologic: CN 2-12 grossly intact on visual inspection- patient with quadriplegia so unable to independently move extremities.  No definitive sensation of extremities when checked. Psychiatric: Awake and nods head appropriately to questions asked.     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 Acute sx's secondary to pneumonia/volume overload - redeveloped Acinetobacter pneumonia (8/14) and completed 7 days of Unasyn Ventilator management per PCCM  Recurrent ventilator associated pneumonia 2/2 recurrent Acinetobacter/sepsis without shock WBCs stable with positive sputum culture sensitive only to Unasyn ID/Dr Juleen China recommended 7 days of therapy -completed on 8/22   AKI with oliguria/volume overload/ New CKD requiring HD Anuric Nephrology following, initially required CRRT and has transitioned to HD (due to staffing issues continues on CRRT at bedside) [facility] placed on 7/26-Left FA AVG graft placed but now occluded so VVS plans new access 9/2   Septic shock 2/2 VAP POA Presented with sepsis physiology and profound shock which has now resolved  Chronic thoracic back pain Continue Oxy and fentanyl prn; Skelaxin and lidocaine patch  History of paroxysmal atrial fibrillation Patient was on amiodarone prior to admission -currently maintaining SR Eliquis discontinued 2/2 recurrent peritracheal /oral bleeding.  No further bleeding on IV heparin.   TSH was WNL this admission QTc 420 ms on 8/65   Acute metabolic encephalopathy Resolved Secondary to acute illness. CT head negative for acute process.  Back at baseline.   Diabetes mellitus, type 2 Continue Semglee 25 units daily.  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  Resolved.  Quadriplegia secondary to compressive myelopathy.  Previously resided at [facility] but with poor performance scores as well as poor rehabilitation potential [facility] will not accept him back if he is HD dependent (he must be able to sit up in a chair for dialysis.)   Microcytic anemia/Acute anemia superimposed on anemia of chronic kidney disease Patient required 4 units of PRBC to date.  Hemoglobin on 8/26 was 8.5 Continue Aranesp   Severe protein calorie malnutrition Nutrition Problem: Increased nutrient needs Etiology: wound healing Signs/Symptoms: estimated needs Interventions: Tube feeding, Prostat, MVI Estimated body mass index is 28.29 kg/m as calculated from the following:   Height as of this encounter: _0  (1.676 m).   Weight as of this encounter: 79.5 kg.  PEG tube unable to be declogged successfully.  Feeding tube on backorder.  Continue Cortrak for feedings and meds. Patient with sore throat and throat discomfort secondary to cortrack tube.  Chloraseptic Spray ordered.  Pressure injury/stage IV sacral decubitus Stage IV medial/posterior sacrum present on admission.  Right/posterior/lateral ankle unknown if present on admission.  DTI left ear, not present on admission. (For additional documentation see wound care notes)    Data Reviewed: Basic Metabolic Panel: Recent Labs  Lab 09/07/20 0528 08/17/2020 0351 09/11/20 0312 09/13/20 0343  NA 130* 130* 131* 132*  K 3.2* 4.0 3.9 4.0  CL 88* 93* 95* 97*  CO2 24 24 20* 25  GLUCOSE 163* 84 169* 117*  BUN 56* 72* 89* 62*  CREATININE 1.18 1.32* 1.57* 1.24  CALCIUM 9.3 9.4 9.0 8.8*  MG  --   --  2.4  --   PHOS 4.4 4.9* 6.4* 5.2*   Liver Function Tests: Recent Labs  Lab 09/07/20 0528 08/21/2020 0351 09/11/20 0312  09/13/20 0343  AST  --   --  28  --   ALT  --   --  15  --   ALKPHOS  --   --  182*  --   BILITOT  --   --  0.8  --   PROT  --   --  6.9  --   ALBUMIN 1.7* 1.7* 1.7* 1.6*   No results for input(s): LIPASE, AMYLASE in the last 168 hours. No results for input(s): AMMONIA in the last 168 hours. CBC: Recent Labs  Lab 09/09/20 0417 09/09/2020 0351 09/11/20 0312 08/22/2020 0356 09/13/20 0343  WBC 16.7* 16.9* 14.6* 15.7* 16.1*  NEUTROABS  --  12.0*  --   --   --   HGB 8.4* 8.3* 8.1* 7.9* 7.6*  HCT 26.6* 25.7* 25.7* 25.2* 24.3*  MCV 81.3 80.1 81.1 81.6 81.5  PLT 498* 451* 446* 427* 419*   Cardiac Enzymes: No results for input(s): CKTOTAL, CKMB, CKMBINDEX, TROPONINI in the last 168 hours. BNP (last 3 results) Recent Labs    06/17/2020 1922  BNP 135.0*    ProBNP (last 3 results) No results for input(s): PROBNP in the last 8760 hours.  CBG: Recent Labs  Lab 08/30/2020 1106 08/24/2020 1613 09/01/2020 2003 09/07/2020 2358 09/13/20 0348  GLUCAP 84 81 94 106* 111*       Studies: No results found.  Scheduled Meds:  amiodarone  200 mg Per Tube Daily   buPROPion  75 mg Per Tube BID   chlorhexidine gluconate (MEDLINE KIT)  15 mL Mouth Rinse BID   Chlorhexidine Gluconate Cloth  6 each Topical Daily   darbepoetin (ARANESP) injection - DIALYSIS  40 mcg Intravenous Q Tue-HD   feeding supplement (PROSource TF)  45 mL Per Tube TID   guaiFENesin  5 mL Per Tube BID   insulin aspart  0-9 Units Subcutaneous Q4H   insulin glargine-yfgn  25 Units Subcutaneous Daily   lidocaine  1 patch Transdermal Q24H   mouth rinse  15 mL Mouth Rinse 10 times per day   metaxalone  800 mg Per Tube TID   midodrine  5 mg Per Tube TID   multivitamin  1 tablet Per Tube QHS   pantoprazole sodium  40 mg Per Tube QHS   scopolamine  1 patch Transdermal Q72H   sertraline  100 mg Per Tube Daily   sodium bicarbonate  650 mg Per Tube Once   sodium chloride flush  10-40 mL Intracatheter Q12H   Continuous  Infusions:  sodium chloride Stopped (08/18/2020 1308)   sodium chloride 500 mL (09/01/20 1408)   sodium chloride     sodium chloride     sodium chloride     feeding supplement (NEPRO CARB STEADY) Stopped (09/07/2020 0045)   heparin 2,100 Units/hr (09/13/20 0700)    Active Problems:   Ventilator dependent ([place])   Septic shock (HCC)   Pressure injury of skin   Acute respiratory failure with hypoxia (HCC)   AKI (acute kidney injury) ([place])   Hypotension   Goals of care, counseling/discussion   Quadriplegia ([place])   Chronic kidney disease requiring chronic dialysis ([place])   Hyperglycemia   Sepsis due to Acinetobacter baumannii ([place])   Pneumonia due to Acinetobacter species ([place])   Dysautonomia ([place])   Chronic midline thoracic back pain   Consultants: PCCM Nephrology Infectious disease Palliative care medicine  Procedures: Echocardiogram Insertion of double-lumen HD catheter  Antibiotics: Cefepime x1 dose Meropenem 6/29 through 7/2 Vancomycin 6/29 through 6/30 Unasyn 7/2 through 7/15 Unasyn 8/15 >   Time spent: 35 minutes    Erin Hearing ANP  [facility] 7 am - 330 pm/M-F for direct patient care and secure chat Please refer to Amion for contact info 64  days

## 2020-09-13 NOTE — Progress Notes (Signed)
[place] for Heparin Indication: atrial fibrillation while apixaban on hold  Allergies  Allergen Reactions   Adhesive [Tape] Rash    Patient Measurements: Height: '5\' 6"'$  (167.6 cm) Weight: (P) 77.2 kg (170 lb 3.1 oz) IBW/kg (Calculated) : 63.8 Heparin Dosing Weight: 74.6 kg   Vital Signs: Temp: 98.9 F (37.2 C) (09/01 1527) Temp Source: Oral (09/01 1527) BP: 110/72 (09/01 1415) Pulse Rate: 104 (09/01 1415)  Labs: Recent Labs    09/11/20 0312 09/11/20 1430 09/11/2020 0356 08/17/2020 0845 08/19/2020 0934 09/08/2020 1800 09/13/20 0343  HGB 8.1*  --  7.9*  --   --   --  7.6*  HCT 25.7*  --  25.2*  --   --   --  24.3*  PLT 446*  --  427*  --   --   --  419*  HEPARINUNFRC 0.20*   < >  --    < > 0.37 0.34 0.35  CREATININE 1.57*  --   --   --   --   --  1.24   < > = values in this interval not displayed.     Estimated Creatinine Clearance: 54.8 mL/min (by C-G formula based on SCr of 1.24 mg/dL).   Medical History: Past Medical History:  Diagnosis Date   Anemia    Anxiety    Chronic respiratory failure (HCC)    DM2 (diabetes mellitus, type 2) (HCC)    Functional quadriplegia (HCC)    Hypertension    Multiple drug resistant organism (MDRO) culture positive    Urine retention    UTI (urinary tract infection)     Assessment: 70 yo male admitted 07/06/2020 on apixaban for Afib. Pharmacy consulted to dose heparin with oral and peri tracheal bleeding. Minimal bleeding in mouth and around trach site - per RN possibly from patient biting lip. No other bleeding noted. Last dose apixaban 8/24 at 1430 (doses on 8/24 PM held per MD).   Heparin level remains within goal on 2100 units/hr. Hg drifting down, plt stable.  Goal of Therapy:  Heparin level 0.3-0.7 units/mL Monitor platelets by anticoagulation protocol: Yes   Plan:  Continue heparin at current rate of 2100 units/hr Monitor daily heparin level and CBC, s/sx bleeding   Arturo Morton, PharmD, BCPS Please check AMION for all [place] contact numbers Clinical Pharmacist 09/13/2020 4:12 PM

## 2020-09-13 NOTE — Anesthesia Postprocedure Evaluation (Signed)
Anesthesia Post Note  Patient: BANE CRUCES  Procedure(s) Performed: INSERTION OF LEFT UPPER ARM ARTERIOVENOUS (AV) GORE-TEX GRAFT (Left: Arm Upper)     Patient location during evaluation: ICU Anesthesia Type: General Level of consciousness: sedated (trach) Pain management: pain level controlled Vital Signs Assessment: post-procedure vital signs reviewed and stable Respiratory status: patient on ventilator - see flowsheet for VS (weaning FIO2) Cardiovascular status: stable Postop Assessment: no apparent nausea or vomiting Anesthetic complications: no Comments: remains critically ill, requiring CVVH, ventilation, trach is stable   No notable events documented.  Last Vitals:  Vitals:   09/13/20 0500 09/13/20 0600  BP: 120/69 117/70  Pulse: 82 82  Resp: 20 20  Temp:    SpO2: 99% 100%    Last Pain:  Vitals:   09/13/20 0348  TempSrc: Oral  PainSc:                  Eliana Lueth,E. Tarin Johndrow

## 2020-09-13 NOTE — Progress Notes (Signed)
Visited with pt's wife and son today at bedside.    Discussed with them that the graft placed has clotted off.  Discussed we will take him back to the OR tomorrow to see if we can declot this graft or if he will need a new upper arm graft.  Discussed with family that the new graft may clot again.  I also discussed with them that his veins are small and he is not a candidate for a fistula.    They expressed understanding and questions were answered.   Pt will be npo after MN, labs in am and consent to be signed.   Leontine Locket, [facility] 09/13/2020 12:17 PM

## 2020-09-13 NOTE — Progress Notes (Signed)
Progress Note    09/13/2020 7:21 AM 3 Days Post-Op  Subjective:  Resting comfortably   Vitals:   09/13/20 0500 09/13/20 0600  BP: 120/69 117/70  Pulse: 82 82  Resp: 20 20  Temp:    SpO2: 99% 100%    Physical Exam: General appearance: in no apparent distress Cardiac: Heart rate and rhythm are regular Respirations: tach, vent, nonlabored  CBC    Component Value Date/Time   WBC 16.1 (H) 09/13/2020 0343   RBC 2.98 (L) 09/13/2020 0343   HGB 7.6 (L) 09/13/2020 0343   HCT 24.3 (L) 09/13/2020 0343   PLT 419 (H) 09/13/2020 0343   MCV 81.5 09/13/2020 0343   MCH 25.5 (L) 09/13/2020 0343   MCHC 31.3 09/13/2020 0343   RDW 19.5 (H) 09/13/2020 0343   LYMPHSABS 2.6 08/27/2020 0351   MONOABS 1.6 (H) 08/19/2020 0351   EOSABS 0.2 09/01/2020 0351   BASOSABS 0.1 08/30/2020 0351    BMET    Component Value Date/Time   NA 132 (L) 09/13/2020 0343   K 4.0 09/13/2020 0343   CL 97 (L) 09/13/2020 0343   CO2 25 09/13/2020 0343   GLUCOSE 117 (H) 09/13/2020 0343   BUN 62 (H) 09/13/2020 0343   CREATININE 1.24 09/13/2020 0343   CALCIUM 8.8 (L) 09/13/2020 0343   GFRNONAA >60 09/13/2020 0343   GFRAA NOT CALCULATED 10/11/2019 0226     Intake/Output Summary (Last 24 hours) at 09/13/2020 0721 Last data filed at 09/13/2020 0700 Gross per 24 hour  Intake 1257.14 ml  Output 400 ml  Net 857.14 ml    HOSPITAL MEDICATIONS Scheduled Meds:  amiodarone  200 mg Per Tube Daily   buPROPion  75 mg Per Tube BID   chlorhexidine gluconate (MEDLINE KIT)  15 mL Mouth Rinse BID   Chlorhexidine Gluconate Cloth  6 each Topical Daily   darbepoetin (ARANESP) injection - DIALYSIS  40 mcg Intravenous Q Tue-HD   feeding supplement (PROSource TF)  45 mL Per Tube TID   guaiFENesin  5 mL Per Tube BID   insulin aspart  0-9 Units Subcutaneous Q4H   insulin glargine-yfgn  25 Units Subcutaneous Daily   lidocaine  1 patch Transdermal Q24H   mouth rinse  15 mL Mouth Rinse 10 times per day   metaxalone  800 mg Per  Tube TID   midodrine  5 mg Per Tube TID   multivitamin  1 tablet Per Tube QHS   pantoprazole sodium  40 mg Per Tube QHS   scopolamine  1 patch Transdermal Q72H   sertraline  100 mg Per Tube Daily   sodium bicarbonate  650 mg Per Tube Once   sodium chloride flush  10-40 mL Intracatheter Q12H   Continuous Infusions:  sodium chloride Stopped (09/06/2020 1308)   sodium chloride 500 mL (09/01/20 1408)   sodium chloride     sodium chloride     sodium chloride     feeding supplement (NEPRO CARB STEADY) Stopped (08/14/2020 0045)   heparin 2,100 Units/hr (09/13/20 0700)   PRN Meds:.sodium chloride, sodium chloride, sodium chloride, alteplase, fentaNYL (SUBLIMAZE) injection, heparin, heparin, ipratropium-albuterol, lidocaine (PF), lidocaine (PF), lidocaine-prilocaine, morphine injection, oxyCODONE, pentafluoroprop-tetrafluoroeth, phenol, polyethylene glycol, sodium chloride flush  Assessment and Plan: POD 3 LUE forearm AVG placement with subsequent thrombosis. Revision planned yesterday but was cancelled due to tachycardia while on HD. Rescheduled for today. TFs stopped at [place]. Hold heparin infusion one hour on call to OR.    Risa Grill, PA-C [facility]  [phone] 09/13/2020  7:21 AM

## 2020-09-13 DEATH — deceased

## 2020-09-14 ENCOUNTER — Encounter (HOSPITAL_COMMUNITY): Admission: EM | Disposition: E | Payer: Self-pay | Source: Home / Self Care | Attending: Internal Medicine

## 2020-09-14 ENCOUNTER — Inpatient Hospital Stay (HOSPITAL_COMMUNITY): Payer: 59 | Admitting: Anesthesiology

## 2020-09-14 DIAGNOSIS — T82868A Thrombosis of vascular prosthetic devices, implants and grafts, initial encounter: Secondary | ICD-10-CM

## 2020-09-14 DIAGNOSIS — Z992 Dependence on renal dialysis: Secondary | ICD-10-CM | POA: Diagnosis not present

## 2020-09-14 DIAGNOSIS — A4159 Other Gram-negative sepsis: Secondary | ICD-10-CM | POA: Diagnosis not present

## 2020-09-14 DIAGNOSIS — A419 Sepsis, unspecified organism: Secondary | ICD-10-CM | POA: Diagnosis not present

## 2020-09-14 DIAGNOSIS — Z9911 Dependence on respirator [ventilator] status: Secondary | ICD-10-CM | POA: Diagnosis not present

## 2020-09-14 DIAGNOSIS — N186 End stage renal disease: Secondary | ICD-10-CM | POA: Diagnosis not present

## 2020-09-14 DIAGNOSIS — G8250 Quadriplegia, unspecified: Secondary | ICD-10-CM | POA: Diagnosis not present

## 2020-09-14 DIAGNOSIS — N185 Chronic kidney disease, stage 5: Secondary | ICD-10-CM

## 2020-09-14 HISTORY — PX: THROMBECTOMY AND REVISION OF ARTERIOVENTOUS (AV) GORETEX  GRAFT: SHX6120

## 2020-09-14 LAB — CBC
HCT: 23.9 % — ABNORMAL LOW (ref 39.0–52.0)
Hemoglobin: 7.5 g/dL — ABNORMAL LOW (ref 13.0–17.0)
MCH: 25.6 pg — ABNORMAL LOW (ref 26.0–34.0)
MCHC: 31.4 g/dL (ref 30.0–36.0)
MCV: 81.6 fL (ref 80.0–100.0)
Platelets: 410 10*3/uL — ABNORMAL HIGH (ref 150–400)
RBC: 2.93 MIL/uL — ABNORMAL LOW (ref 4.22–5.81)
RDW: 19.6 % — ABNORMAL HIGH (ref 11.5–15.5)
WBC: 17.5 10*3/uL — ABNORMAL HIGH (ref 4.0–10.5)
nRBC: 0.8 % — ABNORMAL HIGH (ref 0.0–0.2)

## 2020-09-14 LAB — GLUCOSE, CAPILLARY
Glucose-Capillary: 81 mg/dL (ref 70–99)
Glucose-Capillary: 65 mg/dL — ABNORMAL LOW (ref 70–99)
Glucose-Capillary: 107 mg/dL — ABNORMAL HIGH (ref 70–99)
Glucose-Capillary: 87 mg/dL (ref 70–99)
Glucose-Capillary: 93 mg/dL (ref 70–99)
Glucose-Capillary: 161 mg/dL — ABNORMAL HIGH (ref 70–99)
Glucose-Capillary: 199 mg/dL — ABNORMAL HIGH (ref 70–99)

## 2020-09-14 LAB — SURGICAL PCR SCREEN
MRSA, PCR: NEGATIVE
Staphylococcus aureus: NEGATIVE

## 2020-09-14 LAB — HEPARIN LEVEL (UNFRACTIONATED)
Heparin Unfractionated: 0.22 IU/mL — ABNORMAL LOW (ref 0.30–0.70)
Heparin Unfractionated: 0.32 IU/mL (ref 0.30–0.70)

## 2020-09-14 SURGERY — THROMBECTOMY AND REVISION OF ARTERIOVENTOUS (AV) GORETEX  GRAFT
Anesthesia: General | Site: Arm Upper | Laterality: Left

## 2020-09-14 SURGERY — THROMBECTOMY AND REVISION OF ARTERIOVENTOUS (AV) GORETEX  GRAFT
Anesthesia: General | Laterality: Left

## 2020-09-14 MED ORDER — HEPARIN 6000 UNIT IRRIGATION SOLUTION
Status: AC
  Filled 2020-09-14: qty 500

## 2020-09-14 MED ORDER — HEPARIN 6000 UNIT IRRIGATION SOLUTION
Status: DC | PRN
  Administered 2020-09-14: 1

## 2020-09-14 MED ORDER — MIDAZOLAM HCL 2 MG/2ML IJ SOLN
INTRAMUSCULAR | Status: AC
  Filled 2020-09-14: qty 2

## 2020-09-14 MED ORDER — ROCURONIUM BROMIDE 10 MG/ML (PF) SYRINGE
PREFILLED_SYRINGE | INTRAVENOUS | Status: AC
  Filled 2020-09-14: qty 10

## 2020-09-14 MED ORDER — PHENYLEPHRINE HCL-NACL 20-0.9 MG/250ML-% IV SOLN
INTRAVENOUS | Status: DC | PRN
  Administered 2020-09-14: 40 ug/min via INTRAVENOUS

## 2020-09-14 MED ORDER — 0.9 % SODIUM CHLORIDE (POUR BTL) OPTIME
TOPICAL | Status: DC | PRN
  Administered 2020-09-14: 1000 mL

## 2020-09-14 MED ORDER — MUPIROCIN 2 % EX OINT
1.0000 "application " | TOPICAL_OINTMENT | Freq: Two times a day (BID) | CUTANEOUS | Status: AC
  Administered 2020-09-14 – 2020-09-18 (×10): 1 via NASAL
  Filled 2020-09-14 (×4): qty 22

## 2020-09-14 MED ORDER — HEPARIN (PORCINE) 25000 UT/250ML-% IV SOLN
2250.0000 [IU]/h | INTRAVENOUS | Status: DC
  Administered 2020-09-15 – 2020-09-17 (×5): 2250 [IU]/h via INTRAVENOUS
  Filled 2020-09-14 (×5): qty 250

## 2020-09-14 MED ORDER — DEXTROSE 50 % IV SOLN
25.0000 mL | Freq: Once | INTRAVENOUS | Status: AC
  Administered 2020-09-14: 25 mL via INTRAVENOUS
  Filled 2020-09-14: qty 50

## 2020-09-14 MED ORDER — CEFAZOLIN SODIUM 1 G IJ SOLR
INTRAMUSCULAR | Status: AC
  Filled 2020-09-14: qty 20

## 2020-09-14 MED ORDER — MIDAZOLAM HCL 2 MG/2ML IJ SOLN
INTRAMUSCULAR | Status: DC | PRN
  Administered 2020-09-14: 2 mg via INTRAVENOUS

## 2020-09-14 MED ORDER — CEFAZOLIN SODIUM-DEXTROSE 2-3 GM-%(50ML) IV SOLR
INTRAVENOUS | Status: DC | PRN
  Administered 2020-09-14: 2 g via INTRAVENOUS

## 2020-09-14 MED ORDER — PHENYLEPHRINE 40 MCG/ML (10ML) SYRINGE FOR IV PUSH (FOR BLOOD PRESSURE SUPPORT)
PREFILLED_SYRINGE | INTRAVENOUS | Status: DC | PRN
  Administered 2020-09-14: 120 ug via INTRAVENOUS
  Administered 2020-09-14: 80 ug via INTRAVENOUS
  Administered 2020-09-14: 120 ug via INTRAVENOUS
  Administered 2020-09-14: 80 ug via INTRAVENOUS

## 2020-09-14 MED ORDER — HEPARIN SODIUM (PORCINE) 1000 UNIT/ML IJ SOLN
INTRAMUSCULAR | Status: DC | PRN
  Administered 2020-09-14: 5000 [IU] via INTRAVENOUS

## 2020-09-14 MED ORDER — ONDANSETRON HCL 4 MG/2ML IJ SOLN
INTRAMUSCULAR | Status: DC | PRN
  Administered 2020-09-14: 4 mg via INTRAVENOUS

## 2020-09-14 MED ORDER — DEXAMETHASONE SODIUM PHOSPHATE 10 MG/ML IJ SOLN
INTRAMUSCULAR | Status: DC | PRN
  Administered 2020-09-14: 5 mg via INTRAVENOUS

## 2020-09-14 MED ORDER — ROCURONIUM BROMIDE 10 MG/ML (PF) SYRINGE
PREFILLED_SYRINGE | INTRAVENOUS | Status: DC | PRN
  Administered 2020-09-14 (×2): 50 mg via INTRAVENOUS

## 2020-09-14 MED ORDER — PROTAMINE SULFATE 10 MG/ML IV SOLN
INTRAVENOUS | Status: DC | PRN
  Administered 2020-09-14: 50 mg via INTRAVENOUS

## 2020-09-14 SURGICAL SUPPLY — 45 items
ADH SKN CLS APL DERMABOND .7 (GAUZE/BANDAGES/DRESSINGS) ×1
ARMBAND PINK RESTRICT EXTREMIT (MISCELLANEOUS) ×4 IMPLANT
BAG COUNTER SPONGE SURGICOUNT (BAG) ×2 IMPLANT
BAG SPNG CNTER NS LX DISP (BAG) ×1
BNDG ELASTIC 4X5.8 VLCR STR LF (GAUZE/BANDAGES/DRESSINGS) ×1 IMPLANT
CANISTER SUCT 3000ML PPV (MISCELLANEOUS) ×2 IMPLANT
CATH EMB 3FR 40CM (CATHETERS) ×1 IMPLANT
CATH EMB 3FR 80CM (CATHETERS) ×2 IMPLANT
CATH EMB 4FR 40CM (CATHETERS) ×1 IMPLANT
CATH EMB 4FR 80CM (CATHETERS) ×3 IMPLANT
CATH EMB 5FR 80CM (CATHETERS) IMPLANT
CLIP LIGATING EXTRA MED SLVR (CLIP) ×1 IMPLANT
CLIP LIGATING EXTRA SM BLUE (MISCELLANEOUS) ×1 IMPLANT
CLIP VESOCCLUDE MED 6/CT (CLIP) ×2 IMPLANT
CLIP VESOCCLUDE SM WIDE 6/CT (CLIP) ×2 IMPLANT
DERMABOND ADVANCED (GAUZE/BANDAGES/DRESSINGS) ×1
DERMABOND ADVANCED .7 DNX12 (GAUZE/BANDAGES/DRESSINGS) ×1 IMPLANT
DRSG TEGADERM 4X4.5 CHG (GAUZE/BANDAGES/DRESSINGS) ×1 IMPLANT
ELECT REM PT RETURN 9FT ADLT (ELECTROSURGICAL) ×2
ELECTRODE REM PT RTRN 9FT ADLT (ELECTROSURGICAL) ×1 IMPLANT
GAUZE SPONGE 4X4 12PLY STRL (GAUZE/BANDAGES/DRESSINGS) ×1 IMPLANT
GLOVE SURG ENC MOIS LTX SZ7.5 (GLOVE) ×2 IMPLANT
GLOVE SURG MICRO LTX SZ7 (GLOVE) ×1 IMPLANT
GLOVE SURG UNDER POLY LF SZ7 (GLOVE) ×2 IMPLANT
GOWN STRL REUS W/ TWL LRG LVL3 (GOWN DISPOSABLE) ×2 IMPLANT
GOWN STRL REUS W/ TWL XL LVL3 (GOWN DISPOSABLE) ×1 IMPLANT
GOWN STRL REUS W/TWL LRG LVL3 (GOWN DISPOSABLE) ×6
GOWN STRL REUS W/TWL XL LVL3 (GOWN DISPOSABLE) ×2
KIT BASIN OR (CUSTOM PROCEDURE TRAY) ×2 IMPLANT
KIT TURNOVER KIT B (KITS) ×2 IMPLANT
NS IRRIG 1000ML POUR BTL (IV SOLUTION) ×2 IMPLANT
PACK CV ACCESS (CUSTOM PROCEDURE TRAY) ×2 IMPLANT
PAD ARMBOARD 7.5X6 YLW CONV (MISCELLANEOUS) ×4 IMPLANT
PENCIL BUTTON HOLSTER BLD 10FT (ELECTRODE) ×1 IMPLANT
STOPCOCK 4 WAY LG BORE MALE ST (IV SETS) ×2 IMPLANT
SUT GORETEX 6.0 TH-9 30 IN (SUTURE) ×1 IMPLANT
SUT GORETEX CV-6TTC-13 36IN (SUTURE) ×1 IMPLANT
SUT MNCRL AB 4-0 PS2 18 (SUTURE) IMPLANT
SUT PROLENE 6 0 BV (SUTURE) ×6 IMPLANT
SUT VIC AB 3-0 SH 27 (SUTURE) ×2
SUT VIC AB 3-0 SH 27X BRD (SUTURE) ×1 IMPLANT
SYR 3ML LL SCALE MARK (SYRINGE) ×1 IMPLANT
TOWEL GREEN STERILE (TOWEL DISPOSABLE) ×2 IMPLANT
UNDERPAD 30X36 HEAVY ABSORB (UNDERPADS AND DIAPERS) ×2 IMPLANT
WATER STERILE IRR 1000ML POUR (IV SOLUTION) ×2 IMPLANT

## 2020-09-14 NOTE — Plan of Care (Signed)

## 2020-09-14 NOTE — TOC Progression Note (Signed)
Transition of Care [facility]) - Progression Note    Patient Details  Name: Luis Orr MRN: [id number] Date of Birth: 11-Dec-1950  [facility] CM/SW [place], RN Phone Number: 09/18/2020, 12:16 PM  Clinical Narrative:    CM spoke with the admission MSW at [facility] on 09/13/2020 and he asked to have patient's documents sent by the wife emailed to [email or web address] so the the social worker can follow up with the family regarding SNF placement and processing of [place] Medicaid.  Amedeo Plenty, MSW states that [facility] may be able to admit the patient into a "swing bed" at the facility under his Medicare benefits until his [place] Medicaid is approved.  MSW Amedeo Plenty plans to call the wife and follow up to assist with Insurance.   Expected Discharge Plan: [place] Barriers to Discharge: Continued Medical Work up, No SNF bed, Vent Bed not available  Expected Discharge Plan and Services Expected Discharge Plan: [place]   Discharge Planning Services: CM Consult Post Acute Care Choice: [place] Living arrangements for the past 2 months: Post-Acute Facility                                       Social Determinants of Health (SDOH) Interventions    Readmission Risk Interventions No flowsheet data found.

## 2020-09-14 NOTE — Transfer of Care (Signed)
Immediate Anesthesia Transfer of Care Note  Patient: Luis Orr  Procedure(s) Performed: ARTERIOVENIOUS REVISION AND THROMBECTOMY LEFT ARM (Left: Arm Upper)  Patient Location: ICU  Anesthesia Type:General  Level of Consciousness: Patient remains intubated per anesthesia plan  Airway & Oxygen Therapy: Patient remains intubated per anesthesia plan and Patient placed on Ventilator (see vital sign flow sheet for setting)  Post-op Assessment: Report given to RN and Post -op Vital signs reviewed and stable  Post vital signs: Reviewed and stable  Last Vitals:  Vitals Value Taken Time  BP 124/75 10/05/2020 1636  Temp    Pulse 78 10/02/2020 1645  Resp 20 10/06/2020 1645  SpO2 97 % 09/27/2020 1645  Vitals shown include unvalidated device data.  Last Pain:  Vitals:   10/06/2020 1200  TempSrc: Axillary  PainSc:       Patients Stated Pain Goal: 0 ([id number])  Complications: No notable events documented.

## 2020-09-14 NOTE — Progress Notes (Signed)
[facility] PROGRESS NOTE  Luis Orr GNF:[id number] DOB: 1950-07-04 DOA: 06/20/2020 PCP: Townsend Roger, MD  Status: Remains inpatient appropriate because:Hemodynamically unstable, Persistent severe electrolyte disturbances, Unsafe d/c plan, and Inpatient level of care appropriate due to severity of illness  Dispo: The patient is from: [facility] vent capable              Anticipated d/c is to: SNF Vent capable w/ access to in facility/stretcher based HD-a facility in [place] has been located in preauthorization in progress.              Patient currently is not medically stable to d/c.   Difficult to place patient Yes    Level of care: ICU  Code Status: Partial (no CPR and no defibrillation or cardioversion) Family Communication: Wife June on 8/22.  Of note the wife is also the POA DVT prophylaxis: IV Heparin COVID vaccination status: Unknown    HPI:  70 y.o. 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 Patient presented secondary to altered mental status and hypotension with recurrent pneumonia requiring vasopressor support and ICU admission.he has had multiple recent admissions for sepsis secondary to various infections including UTIs, pneumonias and bacteremia in setting of MDR Klebsiella, MRSA and pseudomonas. Most recent admission September 2021 in setting of sepsis due to MDR Klebsiella with right ureteral calculus s/p double J ureteral stent placement and treated with meropenem x 10days. He was discharged back to [facility].  ICU significant events: 6/29 admitted to ICU for septic shock on levo to maintain MAP>65, continued on vent support 6/29 Blood Cx>> staph species in 1 of 4 cultures drawn>> suspect contaminant 6/30 Off pressors 7/2 ID Consult for MDR acinetobacter in trach asp, Meropenem changed to unasyn 7/2 PRBC transfusion, iron replacement 7/5 Pressors added again. Central line placed.  Drainage from gastrostomy tube >> CT Abd without fluid collection around gastrostomy tube but with diffuse body wall edema. Echo EF 50-55%, mild LVH 7/6 Renal Replacement Therapy ; Code status changed to partial Code (DNR) 7/7 Transfuse 1U PRBC 7/9 add solu cortef 7/10 off pressors, weaning stress steroids 7/11 Pressors added back on again 7/12 ID consult due to rising WBC, hypothermia, hypotension concern for worsening infection. Vancomycin added. Repeat blood cx collected. 7/15 CRRT stopped 7/17 Restarted on low dose levo. No evidence of new infection. Held off on antibiotics. Nephrology contacted family and decision was made to continue iHD this week to allow additional time for renal recovery. Patient is not the best long term iHD candidate but family wants aggressive care 7/18 plans for iHD, tolerated with 2L removed 719 slight drop in hgb to 6.9 will transfuse 1 unit PRBC 7/22 > 7/26: Tolerating HD. TOC consulted for placement. Will likely need out of state placement. 8/15 WBCs greater than 28,000, hypoglycemia, procalcitonin >18, chest x-ray consistent with pneumonia, recent blood cultures consistent with contamination.  Unasyn IV resumed after sputum culture obtained 8/16 consultation placed with ethics committee regarding evaluation for futility of care 8/16 Sputum cx + for Acinetobacter with sensitivities pending 8/17 extensive conversation held with patient's wife regarding patient's poor prognosis and reemergence of Acinetobacter VAP.  Also discussed with her that patient has been able to convey to multiple staff that he does not want to continue on the ventilator or with dialysis.  Wife reports that the patient does not understand what he is saying, she is the POA and that she wants to continue aggressive care. 8/18 patient  now telling nephrology, myself and the rest of the staff he replies yes to wanting to remain on dialysis and on the ventilator and this direct question is  asked. 8/18 Acinetobacter sensitive to Unasyn only.  Discussed with ID/Dr. Juleen China.  Plan is to administer antibiotics for 7-day since patient has not had any increased O2 needs and remains afebrile.  If any concerns over worsening symptoms can give an additional 3 days. Developed moderate bleeding around tracheostomy site on 8/19.  Likely related to combination of Eliquis and heparin required for CRRT.  We will hold Eliquis for now and monitor degree of bleeding Eliquis resumed on 8/22.  Overnight into 8/23 patient bit his tongue and had bleeding.  Also had some bleeding around trach likely from oral bleeding.  Also though did have bleeding from old central line site. 8/23 had transient bleeding around trach and prior central line insertion site overnight.  Patient had bitten tongue and this likely explain the bleeding.  This morning patient was not having any bleeding.  On 8/25 had redeveloped slow ooze bleeding from mouth and trach.  Eliquis discontinued in favor of IV heparin especially given patient will undergo surgical procedure for vascular access on 8/29 8/30 Left FA AVG occluded-.VVS plans to place new access   Subjective: Continues to report not feeling well including back pain and sore throat.  Objective: Vitals:   10/06/2020 0600 10/11/2020 0724  BP: 103/73   Pulse: 77   Resp: 20   Temp:  98.7 F (37.1 C)  SpO2: 96%     Intake/Output Summary (Last 24 hours) at 09/22/2020 0727 Last data filed at 09/23/2020 0600 Gross per 24 hour  Intake 1648.04 ml  Output 1550 ml  Net 98.04 ml   Filed Weights   09/13/20 0745 09/13/20 1115 10/05/2020 0500  Weight: 79 kg (P) 77.2 kg 76.6 kg    Exam:  Constitutional: Awake.  Attempting to interact with conversation.  No acute distress Respiratory: Trach: 6.0 XLT cuffed, Vent settings: PRVC mode, Fio2 40%, rate 20, VT 510, PEEP 5; lungs remain coarse but clear to auscultation bilaterally.  Expiratory effort per vent (diaphragm paralyzed).    Cardiovascular: SR without ectopy, normotensive.  Improved bilateral hand edema Abdomen: LBM 9/02, PEG tube clogged.  Cortrack in place for feedings until back ordered feeding tube available. Left lower quadrant colostomy in place, abdomen soft and nondistended, normoactive bowel sounds. Neurologic: CN 2-12 grossly intact on visual inspection- patient with quadriplegia so unable to independently move extremities.  No sensation below shoulders. Psychiatric: Awake and nods head appropriately to questions asked.     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 Acute sx's secondary to pneumonia/volume overload - redeveloped Acinetobacter pneumonia (8/14) and completed 7 days of Unasyn Ventilator management per PCCM  Recurrent ventilator associated pneumonia 2/2 recurrent Acinetobacter/sepsis without shock WBCs stable with positive sputum culture sensitive only to Unasyn ID/Dr Juleen China recommended 7 days of therapy -completed on 8/22   AKI with oliguria/volume overload/ New CKD requiring HD Anuric Nephrology following, initially required CRRT and has transitioned to HD (due to staffing issues continues on CRRT at bedside) [facility] placed on 7/26-Left FA AVG graft placed but now occluded so VVS plans new access today (9/2)   Septic shock 2/2 VAP POA Presented with sepsis physiology and profound shock which has now resolved  Chronic thoracic back pain Continue Oxy and fentanyl prn; Skelaxin and lidocaine patch  History of paroxysmal atrial fibrillation  Patient was on amiodarone prior to admission -maintaining SR Eliquis discontinued 2/2 recurrent peritracheal /oral bleeding.   No further bleeding on IV heparin.  Can resume Eliquis after dialysis access placed improves functional for at least 1 week. TSH was WNL this admission QTc 420 ms on 5/03   Acute metabolic encephalopathy Resolved Secondary to acute  illness. CT head negative for acute process.  Back at baseline.   Diabetes mellitus, type 2 Continue Semglee 25 units daily.  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  Resolved.  Quadriplegia secondary to compressive myelopathy.  Previously resided at [facility] but with poor performance scores as well as poor rehabilitation potential [facility] will not accept him back if he is HD dependent (he must be able to sit up in a chair for dialysis.)   Microcytic anemia/Acute anemia superimposed on anemia of chronic kidney disease Patient required 4 units of PRBC to date.  Hemoglobin on 8/26 was 8.5 Continue Aranesp   Severe protein calorie malnutrition Nutrition Problem: Increased nutrient needs Etiology: wound healing Signs/Symptoms: estimated needs Interventions: Tube feeding, Prostat, MVI Estimated body mass index is 27.26 kg/m as calculated from the following:   Height as of this encounter: $RemoveBeforeD'5\' 6"'YETSJsuNdeakzP$  (1.676 m).   Weight as of this encounter: 76.6 kg.  PEG tube unable to be declogged successfully.  Feeding tube on backorder.  Continue Cortrak for feedings and meds. Patient with sore throat and throat discomfort secondary to cortrack tube.  Chloraseptic Spray ordered.  Pressure injury/stage IV sacral decubitus Stage IV medial/posterior sacrum present on admission.  Right/posterior/lateral ankle unknown if present on admission.  DTI left ear, not present on admission. (For additional documentation see wound care notes)    Data Reviewed: Basic Metabolic Panel: Recent Labs  Lab 09/06/2020 0351 09/11/20 0312 09/13/20 0343  NA 130* 131* 132*  K 4.0 3.9 4.0  CL 93* 95* 97*  CO2 24 20* 25  GLUCOSE 84 169* 117*  BUN 72* 89* 62*  CREATININE  1.32* 1.57* 1.24  CALCIUM 9.4 9.0 8.8*  MG  --  2.4  --   PHOS 4.9* 6.4* 5.2*   Liver Function Tests: Recent Labs  Lab 08/29/2020 0351 09/11/20 0312 09/13/20 0343  AST  --  28  --   ALT  --  15  --   ALKPHOS  --  182*  --   BILITOT  --  0.8  --   PROT  --  6.9  --   ALBUMIN 1.7* 1.7* 1.6*   No results for input(s): LIPASE, AMYLASE in the last 168 hours. No results for input(s): AMMONIA in the last 168 hours. CBC: Recent Labs  Lab 09/04/2020 0351 09/11/20 0312 08/22/2020 0356 09/13/20 0343 10/02/2020 0335  WBC 16.9* 14.6* 15.7* 16.1* 17.5*  NEUTROABS 12.0*  --   --   --   --   HGB 8.3* 8.1* 7.9* 7.6* 7.5*  HCT 25.7* 25.7* 25.2* 24.3* 23.9*  MCV 80.1 81.1 81.6 81.5 81.6  PLT 451* 446* 427* 419* 410*   Cardiac Enzymes: No results for input(s): CKTOTAL, CKMB, CKMBINDEX, TROPONINI in the last 168 hours. BNP (last 3 results) Recent Labs    06/30/2020 1922  BNP 135.0*    ProBNP (last 3 results) No results for input(s): PROBNP in the last 8760  hours.  CBG: Recent Labs  Lab 09/13/20 1526 09/13/20 1945 09/13/20 2316 10/09/2020 0333 10/02/2020 0721  GLUCAP 214* 155* 162* 81 65*       Studies: No results found.  Scheduled Meds:  amiodarone  200 mg Per Tube Daily   buPROPion  75 mg Per Tube BID   chlorhexidine gluconate (MEDLINE KIT)  15 mL Mouth Rinse BID   Chlorhexidine Gluconate Cloth  6 each Topical Daily   darbepoetin (ARANESP) injection - DIALYSIS  40 mcg Intravenous Q Tue-HD   dextrose  25 mL Intravenous Once   feeding supplement (PROSource TF)  45 mL Per Tube TID   guaiFENesin  5 mL Per Tube BID   insulin aspart  0-9 Units Subcutaneous Q4H   insulin glargine-yfgn  25 Units Subcutaneous Daily   lidocaine  1 patch Transdermal Q24H   mouth rinse  15 mL Mouth Rinse 10 times per day   metaxalone  800 mg Per Tube TID   midodrine  5 mg Per Tube TID   multivitamin  1 tablet Per Tube QHS   pantoprazole sodium  40 mg Per Tube QHS   scopolamine  1 patch Transdermal  Q72H   sertraline  100 mg Per Tube Daily   sodium bicarbonate  650 mg Per Tube Once   sodium chloride flush  10-40 mL Intracatheter Q12H   Continuous Infusions:  sodium chloride Stopped (08/14/2020 1308)   sodium chloride 500 mL (09/01/20 1408)   sodium chloride     sodium chloride     sodium chloride     feeding supplement (NEPRO CARB STEADY) Stopped (09/03/2020 0045)   heparin 2,250 Units/hr (09/30/2020 0600)    Active Problems:   Ventilator dependent ([place])   Septic shock (HCC)   Pressure injury of skin   Acute respiratory failure with hypoxia (HCC)   AKI (acute kidney injury) ([place])   Hypotension   Goals of care, counseling/discussion   Quadriplegia ([place])   Chronic kidney disease requiring chronic dialysis ([place])   Hyperglycemia   Sepsis due to Acinetobacter baumannii ([place])   Pneumonia due to Acinetobacter species ([place])   Dysautonomia ([place])   Chronic midline thoracic back pain   Consultants: PCCM Nephrology Infectious disease Palliative care medicine  Procedures: Echocardiogram Insertion of double-lumen HD catheter  Antibiotics: Cefepime x1 dose Meropenem 6/29 through 7/2 Vancomycin 6/29 through 6/30 Unasyn 7/2 through 7/15 Unasyn 8/15 >   Time spent: 35 minutes    Erin Hearing ANP  [facility] 7 am - 330 pm/M-F for direct patient care and secure chat Please refer to Amion for contact info 65  days

## 2020-09-14 NOTE — Interval H&P Note (Signed)
History and Physical Interval Note:  09/27/2020 2:35 PM  Luis Orr  has presented today for surgery, with the diagnosis of ESRD.  The various methods of treatment have been discussed with the patient and family. After consideration of risks, benefits and other options for treatment, the patient has consented to  Procedure(s): REDO LEFT UPPER EXTREMITY ARTERIOVENTOUS (AV) GORETEX  GRAFT (Left) as a surgical intervention.  The patient's history has been reviewed, patient examined, no change in status, stable for surgery.  I have reviewed the patient's chart and labs.  Questions were answered to the patient's satisfaction.     Annamarie Major

## 2020-09-14 NOTE — Anesthesia Postprocedure Evaluation (Signed)
Anesthesia Post Note  Patient: Luis Orr  Procedure(s) Performed: ARTERIOVENIOUS REVISION AND THROMBECTOMY LEFT ARM (Left: Arm Upper)     Patient location during evaluation: SICU Anesthesia Type: General Level of consciousness: sedated Pain management: pain level controlled Vital Signs Assessment: post-procedure vital signs reviewed and stable Respiratory status: patient connected to tracheostomy mask oxygen Cardiovascular status: stable Postop Assessment: no apparent nausea or vomiting Anesthetic complications: no Comments: Taken back with trach as per plan   No notable events documented.  Last Vitals:  Vitals:   10/12/2020 1400 10/11/2020 1636  BP: 129/84 124/75  Pulse: 72   Resp: 20   Temp:  36.6 C  SpO2: 100%     Last Pain:  Vitals:   10/09/2020 1636  TempSrc: Oral  PainSc:                  Eutha Cude S

## 2020-09-14 NOTE — Progress Notes (Signed)
Inpatient Diabetes Program Recommendations  AACE/ADA: New Consensus Statement on Inpatient Glycemic Control (2015)  Target Ranges:  Prepandial:   less than 140 mg/dL      Peak postprandial:   less than 180 mg/dL (1-2 hours)      Critically ill patients:  140 - 180 mg/dL   Lab Results  Component Value Date   GLUCAP 107 (H) 10/07/2020   HGBA1C 6.3 (H) 06/26/2020    Review of Glycemic Control Results for Luis Orr, Luis Orr (MRN [id number]) as of 09/20/2020 10:53  Ref. Range 09/13/2020 15:26 09/13/2020 19:45 09/13/2020 23:16 09/19/2020 03:33 09/29/2020 07:21 10/06/2020 08:10  Glucose-Capillary Latest Ref Range: 70 - 99 mg/dL 214 (H) 155 (H) 162 (H) 81 65 (L) 107 (H)   Diabetes history: DM 2 Outpatient Diabetes medications: Lantus 30 units daily, Novolin R- 2-10 units q 6 hours Current orders for Inpatient glycemic control:  Novolog sensitive q 4 hours Semglee 25 units daily Inpatient Diabetes Program Recommendations:   Consider reducing Semglee to 20 units daily.   Thanks,  Adah Perl, RN, BC-ADM Inpatient Diabetes Coordinator Pager [phone]  (8a-5p)

## 2020-09-14 NOTE — Anesthesia Preprocedure Evaluation (Signed)
Anesthesia Evaluation  Patient identified by MRN, date of birth, ID band Patient awake    Reviewed: Allergy & Precautions, NPO status , Patient's Chart, lab work & pertinent test results  Airway Mallampati: Trach  TM Distance: >3 FB Neck ROM: Full    Dental no notable dental hx.    Pulmonary  Respiratory failure from high cervical lesion   Pulmonary exam normal breath sounds clear to auscultation       Cardiovascular hypertension, Pt. on medications Normal cardiovascular exam Rhythm:Regular Rate:Normal     Neuro/Psych C4 quadriplegia  Neuromuscular disease negative psych ROS   GI/Hepatic negative GI ROS, Neg liver ROS,   Endo/Other  diabetes, Insulin Dependent  Renal/GU DialysisRenal disease  negative genitourinary   Musculoskeletal negative musculoskeletal ROS (+)   Abdominal   Peds negative pediatric ROS (+)  Hematology  (+) anemia ,   Anesthesia Other Findings   Reproductive/Obstetrics negative OB ROS                             Anesthesia Physical Anesthesia Plan  ASA: 4  Anesthesia Plan: General   Post-op Pain Management:    Induction: Intravenous  PONV Risk Score and Plan: 2 and Ondansetron, Dexamethasone and Treatment may vary due to age or medical condition  Airway Management Planned: Tracheostomy  Additional Equipment:   Intra-op Plan:   Post-operative Plan: Extubation in OR  Informed Consent: I have reviewed the patients History and Physical, chart, labs and discussed the procedure including the risks, benefits and alternatives for the proposed anesthesia with the patient or authorized representative who has indicated his/her understanding and acceptance.    Discussed DNR with patient and Suspend DNR.   Dental advisory given  Plan Discussed with: CRNA and Surgeon  Anesthesia Plan Comments:         Anesthesia Quick Evaluation

## 2020-09-14 NOTE — Progress Notes (Addendum)
ANTICOAGULATION CONSULT NOTE   Pharmacy Consult for Heparin Indication: atrial fibrillation while apixaban on hold  Allergies  Allergen Reactions   Adhesive [Tape] Rash    Patient Measurements: Height: '5\' 6"'$  (167.6 cm) Weight: 76.6 kg (168 lb 14 oz) IBW/kg (Calculated) : 63.8 Heparin Dosing Weight: 74.6 kg   Vital Signs: Temp: 97.8 F (36.6 C) (09/02 1636) Temp Source: Oral (09/02 1636) BP: 124/75 (09/02 1636) Pulse Rate: 72 (09/02 1400)  Labs: Recent Labs    08/28/2020 0356 08/31/2020 0845 09/13/20 0343 10/05/2020 0335 10/10/2020 0339 09/13/2020 1343  HGB 7.9*  --  7.6* 7.5*  --   --   HCT 25.2*  --  24.3* 23.9*  --   --   PLT 427*  --  419* 410*  --   --   HEPARINUNFRC  --    < > 0.35  --  0.22* 0.32  CREATININE  --   --  1.24  --   --   --    < > = values in this interval not displayed.     Estimated Creatinine Clearance: 54 mL/min (by C-G formula based on SCr of 1.24 mg/dL).   Medical History: Past Medical History:  Diagnosis Date   Anemia    Anxiety    Chronic respiratory failure (HCC)    DM2 (diabetes mellitus, type 2) (HCC)    Functional quadriplegia (HCC)    Hypertension    Multiple drug resistant organism (MDRO) culture positive    Urine retention    UTI (urinary tract infection)     Assessment: 71 yo male admitted 06/28/2020 on apixaban for Afib. Pharmacy consulted to dose heparin with oral and peri tracheal bleeding. Minimal bleeding in mouth and around trach site - per RN possibly from patient biting lip. No other bleeding noted. Last dose apixaban 8/24 at 1430 (doses on 8/24 PM held per MD).   Heparin infusion was stopped during procedure - underwent revision of L forearm dialysis graft. Discussed with MD and okay to restart heparin on 9/3'@0800'$ .   Goal of Therapy:  Heparin level 0.3-0.7 units/mL Monitor platelets by anticoagulation protocol: Yes   Plan:  Restart heparin at 2250 units/hr on 9/3'@0800'$  Will f/u 8 hr heparin level Monitor daily HL,  CBC, and for s/sx of bleeding  Antonietta Jewel, PharmD, [place] Pharmac[facility]  Phone: [phone] 10/10/2020 5:39 PM  Please check AMION for all [place] phone numbers After 10:00 PM, call [place] [phone]

## 2020-09-14 NOTE — Op Note (Signed)
Patient name: Luis Orr MRN: [id number] DOB: 29-Sep-1950 Sex: male  10/03/2020 Pre-operative Diagnosis: End-stage renal disease Post-operative diagnosis:  Same Surgeon:  Annamarie Major Assistants:  Laurence Slate, PA Procedure:   #1 : Thrombectomy of left forearm dialysis graft   #2: Revision of left forearm dialysis graft, via conversion from the brachial vein to a end to end anastomosis to the basilic vein   #3: Redo left brachial artery exposure Anesthesia:  General Blood Loss:  minimal Specimens:  none  Findings: I explored the arterial anastomosis which appeared to be widely patent.  I ligated the venous limb of the graft at the level of the anastomosis leaving a 1 mm cuff of graft on the vein.  I then performed thrombectomy of the graft and had excellent inflow and outflow.  A end to end anastomosis to the basilic vein which was a 4-5 mm vein  Indications: This is a 70 year old gentleman, ventilator dependent in need of dialysis access.  He recently underwent a forearm graft which occluded.  He is back today for further evaluation  Procedure:  The patient was identified in the holding area and taken to [place] 11  The patient was then placed supine on the table. general anesthesia was administered.  The patient was prepped and draped in the usual sterile fashion.  A time out was called and antibiotics were administered.  A PA was necessary to expedite procedure and assist with technical details.  The patient's previous longitudinal incision in the antecubital crease was opened with a 10 blade.  The previous sutures were removed.  I then dissected out the venous limb of the graft which was on the medial side.  I then further dissected out the brachial artery.  I got additional length on the brachial artery for better visualization.  I then evaluated the brachial and basilic vein with ultrasound.  The basilic vein appeared to be adequate for outflow measuring 4 to 5 mm.  The patient  was then heparinized.  I then transected the graft at the level of the venous anastomosis, leaving a 1 to 2 mm cuff of graft on the brachial vein.  I then passed a Fogarty catheter into the brachial vein proximally and distally and removed all thrombus.  There was backbleeding from the brachial vein.  I then oversewed the cuff of the graft with running 6-0 Prolene in 2 layers.  Next, I performed thrombectomy of the graft.  I had difficulty getting the Fogarty catheter across the arterial anastomosis which I felt was secondary to the angulation rather than a stenosis.  Ultimately, using #4 and #3 Fogarty catheters, I established excellent flow into the graft.  The graft was then flushed with heparin saline and reoccluded.  I then occluded the basilic vein and ligated it distally.  The vein was approximately 4 to 5 mm.  It was flushed with heparin saline and distended nicely.  I then cut the graft to the appropriate length and performed a end to end anastomosis with a running 6-0 Prolene.  Prior to completion the appropriate flushing maneuvers were performed the anastomosis was completed.  There was an excellent thrill within the fistula and a palpable radial pulse.  At this point, I occluded the brachial artery with vascular clamps.  I made a transverse graftotomy just distal to the arterial anastomosis.  I then passed a #4 Fogarty catheter proximally and distally in the brachial artery.  The anastomosis appeared to be widely patent.  There was no further thrombus.  There was excellent inflow.  I then closed the graftotomy with interrupted 6-0 Prolene sutures.  In order for the dialysis graft to sit at a better angle, I divided some of the biceps muscle so that there was less of an angle at the arterial anastomosis.  Next, the wound was copiously irrigated.  I reapproximated the subcutaneous tissue with 3-0 Vicryl.  The skin was closed with a running nylon suture.  Sterile dressings were applied.  The arm was wrapped  with an Ace bandage.  There were no immediate complications.   Disposition: Patient was returned to the ICU in stable condition.   Theotis Burrow, M.D., [facility] [place] Office: [phone] Pager:  [phone]

## 2020-09-14 NOTE — Progress Notes (Signed)
Hypoglycemic Event  CBG: 65  Treatment: D50 25 mL (12.5 gm)  Symptoms: None  Follow-up CBG: Time: 0810 CBG Result:107  Possible Reasons for Event: Inadequate meal intake: NPO for procedure  Comments/MD notified: treated per protocol    Luis Orr

## 2020-09-14 NOTE — Progress Notes (Signed)
ANTICOAGULATION CONSULT NOTE   Pharmacy Consult for Heparin Indication: atrial fibrillation while apixaban on hold  Allergies  Allergen Reactions   Adhesive [Tape] Rash    Patient Measurements: Height: '5\' 6"'$  (167.6 cm) Weight: (P) 77.2 kg (170 lb 3.1 oz) IBW/kg (Calculated) : 63.8 Heparin Dosing Weight: 74.6 kg   Vital Signs: Temp: 99.1 F (37.3 C) (09/02 0400) Temp Source: Oral (09/02 0400) BP: 125/75 (09/02 0400) Pulse Rate: 80 (09/02 0400)  Labs: Recent Labs    08/13/2020 0356 08/26/2020 0845 08/30/2020 1800 09/13/20 0343 09/30/2020 0335 09/20/2020 0339  HGB 7.9*  --   --  7.6* 7.5*  --   HCT 25.2*  --   --  24.3* 23.9*  --   PLT 427*  --   --  419* 410*  --   HEPARINUNFRC  --    < > 0.34 0.35  --  0.22*  CREATININE  --   --   --  1.24  --   --    < > = values in this interval not displayed.     Estimated Creatinine Clearance: 54.8 mL/min (by C-G formula based on SCr of 1.24 mg/dL).   Medical History: Past Medical History:  Diagnosis Date   Anemia    Anxiety    Chronic respiratory failure (HCC)    DM2 (diabetes mellitus, type 2) (HCC)    Functional quadriplegia (HCC)    Hypertension    Multiple drug resistant organism (MDRO) culture positive    Urine retention    UTI (urinary tract infection)     Assessment: 70 yo male admitted 07/10/2020 on apixaban for Afib. Pharmacy consulted to dose heparin with oral and peri tracheal bleeding. Minimal bleeding in mouth and around trach site - per RN possibly from patient biting lip. No other bleeding noted. Last dose apixaban 8/24 at 1430 (doses on 8/24 PM held per MD).   Heparin level now down to subtherapeutic (0.22) on gtt at 2100 units/hr. No issues with line or bleeding reported per RN. Hgb low but stable.  Goal of Therapy:  Heparin level 0.3-0.7 units/mL Monitor platelets by anticoagulation protocol: Yes   Plan:  Increase heparin to 2250 units/hr Will f/u 8 hr heparin level  Sherlon Handing, PharmD, BCPS Please  see amion for complete clinical pharmacist phone list 09/13/2020 4:47 AM

## 2020-09-14 NOTE — Progress Notes (Signed)
Patient ID: Luis Orr, male   DOB: 1950/12/26, 70 y.o.   MRN: [id number] S: no acute events overnight, s/p hd yesterday net uf 1300cc. Open for graft declot today O:BP 110/79   Pulse 80   Temp 98.7 F (37.1 C) (Axillary)   Resp 20   Ht 5' 6"  (1.676 m)   Wt 76.6 kg   SpO2 98%   BMI 27.26 kg/m   Intake/Output Summary (Last 24 hours) at 09/23/2020 0752 Last data filed at 09/20/2020 0600 Gross per 24 hour  Intake 1648.04 ml  Output 1550 ml  Net 98.04 ml   Intake/Output: I/O last 3 completed shifts: In: 2588.4 [I.V.:728.4; NG/GT:1860] Out: 0277 [Other:1300; Stool:550]  Intake/Output this shift:  No intake/output data recorded. Weight change: -0.5 kg Gen:on vent via trach AJO:INOMV at 111 Resp:ventilated bs bilaterally Abd:+BS, soft, NT/ND Ext: no edema  Recent Labs  Lab 09/09/2020 0351 09/11/20 0312 09/13/20 0343  NA 130* 131* 132*  K 4.0 3.9 4.0  CL 93* 95* 97*  CO2 24 20* 25  GLUCOSE 84 169* 117*  BUN 72* 89* 62*  CREATININE 1.32* 1.57* 1.24  ALBUMIN 1.7* 1.7* 1.6*  CALCIUM 9.4 9.0 8.8*  PHOS 4.9* 6.4* 5.2*  AST  --  28  --   ALT  --  15  --    Liver Function Tests: Recent Labs  Lab 08/28/2020 0351 09/11/20 0312 09/13/20 0343  AST  --  28  --   ALT  --  15  --   ALKPHOS  --  182*  --   BILITOT  --  0.8  --   PROT  --  6.9  --   ALBUMIN 1.7* 1.7* 1.6*   No results for input(s): LIPASE, AMYLASE in the last 168 hours. No results for input(s): AMMONIA in the last 168 hours. CBC: Recent Labs  Lab 09/11/2020 0351 09/11/20 0312 08/31/2020 0356 09/13/20 0343 09/26/2020 0335  WBC 16.9* 14.6* 15.7* 16.1* 17.5*  NEUTROABS 12.0*  --   --   --   --   HGB 8.3* 8.1* 7.9* 7.6* 7.5*  HCT 25.7* 25.7* 25.2* 24.3* 23.9*  MCV 80.1 81.1 81.6 81.5 81.6  PLT 451* 446* 427* 419* 410*   Cardiac Enzymes: No results for input(s): CKTOTAL, CKMB, CKMBINDEX, TROPONINI in the last 168 hours. CBG: Recent Labs  Lab 09/13/20 1526 09/13/20 1945 09/13/20 2316 10/05/2020 0333  09/25/2020 0721  GLUCAP 214* 155* 162* 81 65*    Iron Studies: No results for input(s): IRON, TIBC, TRANSFERRIN, FERRITIN in the last 72 hours. Studies/Results: No results found.  amiodarone  200 mg Per Tube Daily   buPROPion  75 mg Per Tube BID   chlorhexidine gluconate (MEDLINE KIT)  15 mL Mouth Rinse BID   Chlorhexidine Gluconate Cloth  6 each Topical Daily   darbepoetin (ARANESP) injection - DIALYSIS  40 mcg Intravenous Q Tue-HD   feeding supplement (PROSource TF)  45 mL Per Tube TID   guaiFENesin  5 mL Per Tube BID   insulin aspart  0-9 Units Subcutaneous Q4H   insulin glargine-yfgn  25 Units Subcutaneous Daily   lidocaine  1 patch Transdermal Q24H   mouth rinse  15 mL Mouth Rinse 10 times per day   metaxalone  800 mg Per Tube TID   midodrine  5 mg Per Tube TID   multivitamin  1 tablet Per Tube QHS   pantoprazole sodium  40 mg Per Tube QHS   scopolamine  1 patch Transdermal Q72H  sertraline  100 mg Per Tube Daily   sodium bicarbonate  650 mg Per Tube Once   sodium chloride flush  10-40 mL Intracatheter Q12H    BMET    Component Value Date/Time   NA 132 (L) 09/13/2020 0343   K 4.0 09/13/2020 0343   CL 97 (L) 09/13/2020 0343   CO2 25 09/13/2020 0343   GLUCOSE 117 (H) 09/13/2020 0343   BUN 62 (H) 09/13/2020 0343   CREATININE 1.24 09/13/2020 0343   CALCIUM 8.8 (L) 09/13/2020 0343   GFRNONAA >60 09/13/2020 0343   GFRAA NOT CALCULATED 10/11/2019 0226   CBC    Component Value Date/Time   WBC 17.5 (H) 10/01/2020 0335   RBC 2.93 (L) 09/24/2020 0335   HGB 7.5 (L) 10/10/2020 0335   HCT 23.9 (L) 10/09/2020 0335   PLT 410 (H) 09/23/2020 0335   MCV 81.6 09/26/2020 0335   MCH 25.6 (L) 09/23/2020 0335   MCHC 31.4 10/09/2020 0335   RDW 19.6 (H) 09/21/2020 0335   LYMPHSABS 2.6 09/07/2020 0351   MONOABS 1.6 (H) 08/24/2020 0351   EOSABS 0.2 09/03/2020 0351   BASOSABS 0.1 08/19/2020 0351      Assessment/Plan:   AKI now ESRD - initiated on CRRT 07/18/20 and transitioned to  IHD.  Has TDC but requires a AVF/AVG for out of state LTC facility.  Continue with HD on TTS schedule for now. Septic shock/VDRF associated PNA.  Off antibiotics.   VDRF - s/p trach, plan per PCCM.  Remains on vent.  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  VVS following and plan for declot vs new LUE AVG 9/2 Disposition - pt cannot return to [facility] now that he is ESRD and will need AVG placement before out of state SNF will accept.  May need to discuss goals of care again with family/guardian/HCPOA if he starts having issues with IHD again  Gean Quint, MD [place]

## 2020-09-15 DIAGNOSIS — J9601 Acute respiratory failure with hypoxia: Secondary | ICD-10-CM | POA: Diagnosis not present

## 2020-09-15 LAB — GLUCOSE, CAPILLARY
Glucose-Capillary: 178 mg/dL — ABNORMAL HIGH (ref 70–99)
Glucose-Capillary: 156 mg/dL — ABNORMAL HIGH (ref 70–99)
Glucose-Capillary: 148 mg/dL — ABNORMAL HIGH (ref 70–99)
Glucose-Capillary: 172 mg/dL — ABNORMAL HIGH (ref 70–99)
Glucose-Capillary: 160 mg/dL — ABNORMAL HIGH (ref 70–99)
Glucose-Capillary: 161 mg/dL — ABNORMAL HIGH (ref 70–99)

## 2020-09-15 LAB — CBC
HCT: 22.7 % — ABNORMAL LOW (ref 39.0–52.0)
Hemoglobin: 7.3 g/dL — ABNORMAL LOW (ref 13.0–17.0)
MCH: 26.1 pg (ref 26.0–34.0)
MCHC: 32.2 g/dL (ref 30.0–36.0)
MCV: 81.1 fL (ref 80.0–100.0)
Platelets: 380 10*3/uL (ref 150–400)
RBC: 2.8 MIL/uL — ABNORMAL LOW (ref 4.22–5.81)
RDW: 19.8 % — ABNORMAL HIGH (ref 11.5–15.5)
WBC: 20.2 10*3/uL — ABNORMAL HIGH (ref 4.0–10.5)
nRBC: 0.6 % — ABNORMAL HIGH (ref 0.0–0.2)

## 2020-09-15 LAB — RENAL FUNCTION PANEL
Albumin: 1.6 g/dL — ABNORMAL LOW (ref 3.5–5.0)
Anion gap: 11 (ref 5–15)
BUN: 53 mg/dL — ABNORMAL HIGH (ref 8–23)
CO2: 24 mmol/L (ref 22–32)
Calcium: 9 mg/dL (ref 8.9–10.3)
Chloride: 98 mmol/L (ref 98–111)
Creatinine, Ser: 1.22 mg/dL (ref 0.61–1.24)
GFR, Estimated: >60 mL/min (ref >60)
Glucose, Bld: 200 mg/dL — ABNORMAL HIGH (ref 70–99)
Phosphorus: 5.2 mg/dL — ABNORMAL HIGH (ref 2.5–4.6)
Potassium: 3.7 mmol/L (ref 3.5–5.1)
Sodium: 133 mmol/L — ABNORMAL LOW (ref 135–145)

## 2020-09-15 LAB — HEPARIN LEVEL (UNFRACTIONATED): Heparin Unfractionated: 0.56 IU/mL (ref 0.30–0.70)

## 2020-09-15 MED ORDER — GLYCOPYRROLATE 1 MG PO TABS
1.0000 mg | ORAL_TABLET | Freq: Two times a day (BID) | ORAL | Status: DC
  Administered 2020-09-15 – 2020-09-20 (×10): 1 mg
  Filled 2020-09-15 (×10): qty 1

## 2020-09-15 MED ORDER — INSULIN GLARGINE-YFGN 100 UNIT/ML ~~LOC~~ SOLN
30.0000 [IU] | Freq: Every day | SUBCUTANEOUS | Status: DC
  Administered 2020-09-15 – 2020-10-20 (×36): 30 [IU] via SUBCUTANEOUS
  Filled 2020-09-15 (×37): qty 0.3

## 2020-09-15 MED ORDER — DARBEPOETIN ALFA 100 MCG/0.5ML IJ SOSY
100.0000 ug | PREFILLED_SYRINGE | INTRAMUSCULAR | Status: DC
  Administered 2020-09-18 – 2020-10-09 (×4): 100 ug via INTRAVENOUS
  Filled 2020-09-15 (×9): qty 0.5

## 2020-09-15 NOTE — Progress Notes (Signed)
ANTICOAGULATION CONSULT NOTE   Pharmacy Consult for Heparin Indication: atrial fibrillation while apixaban on hold  Allergies  Allergen Reactions   Adhesive [Tape] Rash    Patient Measurements: Height: '5\' 6"'$  (167.6 cm) Weight: 77.5 kg (170 lb 13.7 oz) IBW/kg (Calculated) : 63.8 Heparin Dosing Weight: 74.6 kg   Vital Signs: Temp: 98.3 F (36.8 C) (09/03 1500) Temp Source: Oral (09/03 1500) BP: 135/93 (09/03 1300) Pulse Rate: 78 (09/03 1300)  Labs: Recent Labs    09/13/20 0343 09/13/2020 0335 10/12/2020 0339 09/24/2020 1343 09/15/20 0500 09/15/20 0635 09/15/20 1600  HGB 7.6* 7.5*  --   --  7.3*  --   --   HCT 24.3* 23.9*  --   --  22.7*  --   --   PLT 419* 410*  --   --  380  --   --   HEPARINUNFRC 0.35  --  0.22* 0.32  --   --  0.56  CREATININE 1.24  --   --   --   --  1.22  --      Estimated Creatinine Clearance: 55.2 mL/min (by C-G formula based on SCr of 1.22 mg/dL).   Medical History: Past Medical History:  Diagnosis Date   Anemia    Anxiety    Chronic respiratory failure (HCC)    DM2 (diabetes mellitus, type 2) (HCC)    Functional quadriplegia (HCC)    Hypertension    Multiple drug resistant organism (MDRO) culture positive    Urine retention    UTI (urinary tract infection)     Assessment: 70 yo male admitted 07/09/2020 on apixaban for Afib. Pharmacy consulted to dose heparin with oral and peri tracheal bleeding. Minimal bleeding in mouth and around trach site - per RN possibly from patient biting lip. No other bleeding noted. Last dose apixaban 8/24 at 1430 (doses on 8/24 PM held per MD).   Heparin infusion was stopped during procedure - underwent revision of L forearm dialysis graft. Discussed with MD and okay to restart heparin on 9/3'@0800'$ .  Heparin drip 2250 uts/hr Heparin level 0.56 at goal h/h low stable, pltc ok   Goal of Therapy:  Heparin level 0.3-0.7 units/mL Monitor platelets by anticoagulation protocol: Yes   Plan:  Continue heparin at  2250 units/hr  Monitor daily HL, CBC, and for s/sx of bleeding    Bonnita Nasuti Pharm.D. CPP, BCPS Clinical Pharmacist [phone] 09/15/2020 7:37 PM   Please check AMION for all [place] phone numbers After 10:00 PM, call [place] [phone]

## 2020-09-15 NOTE — Progress Notes (Signed)
   VASCULAR SURGERY ASSESSMENT & PLAN:   POD 1 THROMBECTOMY AND REVISION LEFT FOREARM AV GRAFT: His graft has a good thrill.  He has a palpable radial pulse.  The graft can be used in 4 weeks.  Vascular surgery will be available as needed.  SUBJECTIVE:   Appears comfortable.  PHYSICAL EXAM:   Vitals:   09/27/2020 2325 09/15/20 0331 09/15/20 0500 09/15/20 0600  BP:   130/76 136/79  Pulse:   79 82  Resp:   (!) 24 20  Temp: 98 F (36.7 C) 98.1 F (36.7 C)    TempSrc: Oral Oral    SpO2:   99% 100%  Weight:   79.5 kg   Height:       Good thrill in left forearm AV graft. His incision looks fine. Palpable left radial pulse.  LABS:   Lab Results  Component Value Date   WBC 20.2 (H) 09/15/2020   HGB 7.3 (L) 09/15/2020   HCT 22.7 (L) 09/15/2020   MCV 81.1 09/15/2020   PLT 380 09/15/2020   Lab Results  Component Value Date   CREATININE 1.24 09/13/2020   Lab Results  Component Value Date   INR 1.9 (H) 07/25/2020   CBG (last 3)  Recent Labs    09/24/2020 1926 09/29/2020 2318 09/15/20 0326  GLUCAP 161* 199* 178*    PROBLEM LIST:    Active Problems:   Ventilator dependent (HCC)   Septic shock (HCC)   Pressure injury of skin   Acute respiratory failure with hypoxia (HCC)   AKI (acute kidney injury) ([place])   Hypotension   Goals of care, counseling/discussion   Quadriplegia (HCC)   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)   Hyperglycemia   Sepsis due to Acinetobacter baumannii ([place])   Pneumonia due to Acinetobacter species ([place])   Dysautonomia ([place])   Chronic midline thoracic back pain   CURRENT MEDS:    amiodarone  200 mg Per Tube Daily   buPROPion  75 mg Per Tube BID   chlorhexidine gluconate (MEDLINE KIT)  15 mL Mouth Rinse BID   Chlorhexidine Gluconate Cloth  6 each Topical Daily   darbepoetin (ARANESP) injection - DIALYSIS  40 mcg Intravenous Q Tue-HD   feeding supplement (PROSource TF)  45 mL Per Tube TID   guaiFENesin  5 mL Per Tube BID    insulin aspart  0-9 Units Subcutaneous Q4H   insulin glargine-yfgn  25 Units Subcutaneous Daily   lidocaine  1 patch Transdermal Q24H   mouth rinse  15 mL Mouth Rinse 10 times per day   metaxalone  800 mg Per Tube TID   midodrine  5 mg Per Tube TID   multivitamin  1 tablet Per Tube QHS   mupirocin ointment  1 application Nasal BID   pantoprazole sodium  40 mg Per Tube QHS   scopolamine  1 patch Transdermal Q72H   sertraline  100 mg Per Tube Daily   sodium bicarbonate  650 mg Per Tube Once   sodium chloride flush  10-40 mL Intracatheter [place] Office: [phone] 09/15/2020

## 2020-09-15 NOTE — Progress Notes (Signed)
Assisted family with tele-visit via elink 

## 2020-09-15 NOTE — Progress Notes (Signed)
Patient ID: Luis Orr, male   DOB: [birth year], 70 y.o.   MRN: [id number] S: no acute events overnight, s/p lue avg thrombectomy & revision yesterday. About to start HD now O:BP 136/79   Pulse 82   Temp 98.1 F (36.7 C) (Oral)   Resp 20   Ht 5' 6"  (1.676 m)   Wt 79.5 kg   SpO2 100%   BMI 28.29 kg/m   Intake/Output Summary (Last 24 hours) at 09/15/2020 0751 Last data filed at 09/15/2020 0600 Gross per 24 hour  Intake 1110.04 ml  Output 50 ml  Net 1060.04 ml   Intake/Output: I/O last 3 completed shifts: In: 1759.8 [I.V.:729.8; NG/GT:1030] Out: 340 [Emesis/NG output:40; Stool:250; Blood:50]  Intake/Output this shift:  No intake/output data recorded. Weight change: 0.5 kg Gen:on vent via trach NMM:HWKGS at 111 Resp:ventilated bs bilaterally Abd:+BS, soft, NT/ND Ext: no edema  Recent Labs  Lab 08/28/2020 0351 09/11/20 0312 09/13/20 0343  NA 130* 131* 132*  K 4.0 3.9 4.0  CL 93* 95* 97*  CO2 24 20* 25  GLUCOSE 84 169* 117*  BUN 72* 89* 62*  CREATININE 1.32* 1.57* 1.24  ALBUMIN 1.7* 1.7* 1.6*  CALCIUM 9.4 9.0 8.8*  PHOS 4.9* 6.4* 5.2*  AST  --  28  --   ALT  --  15  --    Liver Function Tests: Recent Labs  Lab 09/07/2020 0351 09/11/20 0312 09/13/20 0343  AST  --  28  --   ALT  --  15  --   ALKPHOS  --  182*  --   BILITOT  --  0.8  --   PROT  --  6.9  --   ALBUMIN 1.7* 1.7* 1.6*   No results for input(s): LIPASE, AMYLASE in the last 168 hours. No results for input(s): AMMONIA in the last 168 hours. CBC: Recent Labs  Lab 09/04/2020 0351 09/11/20 0312 09/04/2020 0356 09/13/20 0343 10/11/2020 0335 09/15/20 0500  WBC 16.9* 14.6* 15.7* 16.1* 17.5* 20.2*  NEUTROABS 12.0*  --   --   --   --   --   HGB 8.3* 8.1* 7.9* 7.6* 7.5* 7.3*  HCT 25.7* 25.7* 25.2* 24.3* 23.9* 22.7*  MCV 80.1 81.1 81.6 81.5 81.6 81.1  PLT 451* 446* 427* 419* 410* 380   Cardiac Enzymes: No results for input(s): CKTOTAL, CKMB, CKMBINDEX, TROPONINI in the last 168 hours. CBG: Recent  Labs  Lab 10/07/2020 1706 10/03/2020 1926 09/16/2020 2318 09/15/20 0326 09/15/20 0748  GLUCAP 93 161* 199* 178* 156*    Iron Studies: No results for input(s): IRON, TIBC, TRANSFERRIN, FERRITIN in the last 72 hours. Studies/Results: No results found.  amiodarone  200 mg Per Tube Daily   buPROPion  75 mg Per Tube BID   chlorhexidine gluconate (MEDLINE KIT)  15 mL Mouth Rinse BID   Chlorhexidine Gluconate Cloth  6 each Topical Daily   darbepoetin (ARANESP) injection - DIALYSIS  40 mcg Intravenous Q Tue-HD   feeding supplement (PROSource TF)  45 mL Per Tube TID   guaiFENesin  5 mL Per Tube BID   insulin aspart  0-9 Units Subcutaneous Q4H   insulin glargine-yfgn  25 Units Subcutaneous Daily   lidocaine  1 patch Transdermal Q24H   mouth rinse  15 mL Mouth Rinse 10 times per day   metaxalone  800 mg Per Tube TID   midodrine  5 mg Per Tube TID   multivitamin  1 tablet Per Tube QHS   mupirocin ointment  1  application Nasal BID   pantoprazole sodium  40 mg Per Tube QHS   scopolamine  1 patch Transdermal Q72H   sertraline  100 mg Per Tube Daily   sodium bicarbonate  650 mg Per Tube Once   sodium chloride flush  10-40 mL Intracatheter Q12H    BMET    Component Value Date/Time   NA 132 (L) 09/13/2020 0343   K 4.0 09/13/2020 0343   CL 97 (L) 09/13/2020 0343   CO2 25 09/13/2020 0343   GLUCOSE 117 (H) 09/13/2020 0343   BUN 62 (H) 09/13/2020 0343   CREATININE 1.24 09/13/2020 0343   CALCIUM 8.8 (L) 09/13/2020 0343   GFRNONAA >60 09/13/2020 0343   GFRAA NOT CALCULATED 10/11/2019 0226   CBC    Component Value Date/Time   WBC 20.2 (H) 09/15/2020 0500   RBC 2.80 (L) 09/15/2020 0500   HGB 7.3 (L) 09/15/2020 0500   HCT 22.7 (L) 09/15/2020 0500   PLT 380 09/15/2020 0500   MCV 81.1 09/15/2020 0500   MCH 26.1 09/15/2020 0500   MCHC 32.2 09/15/2020 0500   RDW 19.8 (H) 09/15/2020 0500   LYMPHSABS 2.6 09/07/2020 0351   MONOABS 1.6 (H) 08/25/2020 0351   EOSABS 0.2 08/30/2020 0351    BASOSABS 0.1 08/25/2020 0351      Assessment/Plan:   AKI now ESRD - initiated on CRRT 07/18/20 and transitioned to IHD.  Has TDC but required a AVF/AVG for out of state LTC facility.  Continue with HD on TTS schedule for now. Septic shock/VDRF associated PNA.  Off antibiotics.   VDRF - s/p trach, plan per PCCM.  Remains on vent.  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  s/p thrombectomy and revision 9/2, graft can be used 4 weeks thereafter Disposition - pt cannot return to [facility] now that he is ESRD and will need AVG placement before out of state SNF will accept which has now been done.  May need to discuss goals of care again with family/guardian/HCPOA if he starts having issues with IHD again  VGean Quint MD [place]

## 2020-09-15 NOTE — Progress Notes (Signed)
[facility] PROGRESS NOTE  Luis Orr GNF:[id number] DOB: 1950-07-04 DOA: 06/15/2020 PCP: Townsend Roger, MD  Status: Remains inpatient appropriate because:Hemodynamically unstable, Persistent severe electrolyte disturbances, Unsafe d/c plan, and Inpatient level of care appropriate due to severity of illness  Dispo: The patient is from: [facility] vent capable              Anticipated d/c is to: SNF Vent capable w/ access to in facility/stretcher based HD-a facility in [place] has been located in preauthorization in progress.              Patient currently is not medically stable to d/c.   Difficult to place patient Yes    Level of care: ICU  Code Status: Partial (no CPR and no defibrillation or cardioversion) Family Communication: Wife June on 8/22.  Of note the wife is also the POA DVT prophylaxis: IV Heparin COVID vaccination status: Unknown    HPI:  70 y.o. 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 Patient presented secondary to altered mental status and hypotension with recurrent pneumonia requiring vasopressor support and ICU admission.he has had multiple recent admissions for sepsis secondary to various infections including UTIs, pneumonias and bacteremia in setting of MDR Klebsiella, MRSA and pseudomonas. Most recent admission September 2021 in setting of sepsis due to MDR Klebsiella with right ureteral calculus s/p double J ureteral stent placement and treated with meropenem x 10days. He was discharged back to [facility].  ICU significant events: 6/29 admitted to ICU for septic shock on levo to maintain MAP>65, continued on vent support 6/29 Blood Cx>> staph species in 1 of 4 cultures drawn>> suspect contaminant 6/30 Off pressors 7/2 ID Consult for MDR acinetobacter in trach asp, Meropenem changed to unasyn 7/2 PRBC transfusion, iron replacement 7/5 Pressors added again. Central line placed.  Drainage from gastrostomy tube >> CT Abd without fluid collection around gastrostomy tube but with diffuse body wall edema. Echo EF 50-55%, mild LVH 7/6 Renal Replacement Therapy ; Code status changed to partial Code (DNR) 7/7 Transfuse 1U PRBC 7/9 add solu cortef 7/10 off pressors, weaning stress steroids 7/11 Pressors added back on again 7/12 ID consult due to rising WBC, hypothermia, hypotension concern for worsening infection. Vancomycin added. Repeat blood cx collected. 7/15 CRRT stopped 7/17 Restarted on low dose levo. No evidence of new infection. Held off on antibiotics. Nephrology contacted family and decision was made to continue iHD this week to allow additional time for renal recovery. Patient is not the best long term iHD candidate but family wants aggressive care 7/18 plans for iHD, tolerated with 2L removed 719 slight drop in hgb to 6.9 will transfuse 1 unit PRBC 7/22 > 7/26: Tolerating HD. TOC consulted for placement. Will likely need out of state placement. 8/15 WBCs greater than 28,000, hypoglycemia, procalcitonin >18, chest x-ray consistent with pneumonia, recent blood cultures consistent with contamination.  Unasyn IV resumed after sputum culture obtained 8/16 consultation placed with ethics committee regarding evaluation for futility of care 8/16 Sputum cx + for Acinetobacter with sensitivities pending 8/17 extensive conversation held with patient's wife regarding patient's poor prognosis and reemergence of Acinetobacter VAP.  Also discussed with her that patient has been able to convey to multiple staff that he does not want to continue on the ventilator or with dialysis.  Wife reports that the patient does not understand what he is saying, she is the POA and that she wants to continue aggressive care. 8/18 patient  now telling nephrology, myself and the rest of the staff he replies yes to wanting to remain on dialysis and on the ventilator and this direct question is  asked. 8/18 Acinetobacter sensitive to Unasyn only.  Discussed with ID/Dr. Juleen China.  Plan is to administer antibiotics for 7-day since patient has not had any increased O2 needs and remains afebrile.  If any concerns over worsening symptoms can give an additional 3 days. Developed moderate bleeding around tracheostomy site on 8/19.  Likely related to combination of Eliquis and heparin required for CRRT.  We will hold Eliquis for now and monitor degree of bleeding Eliquis resumed on 8/22.  Overnight into 8/23 patient bit his tongue and had bleeding.  Also had some bleeding around trach likely from oral bleeding.  Also though did have bleeding from old central line site. 8/23 had transient bleeding around trach and prior central line insertion site overnight.  Patient had bitten tongue and this likely explain the bleeding.  This morning patient was not having any bleeding.  On 8/25 had redeveloped slow ooze bleeding from mouth and trach.  Eliquis discontinued in favor of IV heparin especially given patient will undergo surgical procedure for vascular access on 8/29 8/30 Left FA AVG occluded-.VVS plans to place new access   Subjective: Patient seen and examined.  Due to trach, patient is nonverbal.  Per primary RN who is at the bedside, patient does follow commands and typically communicates with head movement but with me, he is not doing that either.  He is awake and alert.  Objective: Vitals:   09/15/20 1045 09/15/20 1100  BP: 104/87 119/81  Pulse: 84 80  Resp: (!) 22 (!) 23  Temp:    SpO2: 100% 100%    Intake/Output Summary (Last 24 hours) at 09/15/2020 1118 Last data filed at 09/15/2020 0600 Gross per 24 hour  Intake 1010.12 ml  Output 50 ml  Net 960.12 ml    Filed Weights   09/13/2020 0500 09/15/20 0500 09/15/20 0700  Weight: 76.6 kg 79.5 kg 79.5 kg    Exam: General exam: Appears calm and comfortable  Respiratory system: Worse breath sounds bilaterally, tracheostomy with serous  secretions. Cardiovascular system: S1 & S2 heard, RRR. No JVD, murmurs, rubs, gallops or clicks. No pedal edema. Gastrointestinal system: Abdomen is nondistended, soft and nontender. No organomegaly or masses felt. Normal bowel sounds heard.   Central nervous system: Alert but unable to assess orientation.  Quadriplegic.  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 Acute sx's secondary to pneumonia/volume overload - redeveloped Acinetobacter pneumonia (8/14) and completed 7 days of Unasyn Ventilator management per PCCM  Recurrent ventilator associated pneumonia 2/2 recurrent Acinetobacter/sepsis without shock WBCs stable with positive sputum culture sensitive only to Unasyn ID/Dr Juleen China recommended 7 days of therapy -completed on 8/22   AKI with oliguria/volume overload/ New CKD requiring HD Anuric Nephrology following, initially required CRRT and has transitioned to HD (due to staffing issues continues on CRRT at bedside) [facility] placed on 7/26-Left FA AVG graft placed but was occluded so we VVS performed thrombectomy and revision of left forearm AV graft on 09/28/2020.   Septic shock 2/2 VAP POA Presented with sepsis physiology and profound shock which has now resolved  Chronic thoracic back pain Continue Oxy and fentanyl prn; Skelaxin and lidocaine patch  History of paroxysmal atrial fibrillation Patient was on amiodarone prior to admission -maintaining SR Eliquis discontinued 2/2 recurrent peritracheal /oral bleeding.   No  further bleeding on IV heparin.  Will reassess on Monday for consideration of resuming Eliquis. TSH was WNL this admission QTc 420 ms on 9/70   Acute metabolic encephalopathy Resolved Secondary to acute illness. CT head negative for acute process.  Back at baseline.   Diabetes mellitus, type 2 Blood sugar fairly controlled.  Continue Semglee 30 units daily.  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  Resolved.  Quadriplegia secondary to compressive myelopathy.  Previously resided at [facility] but with poor performance scores as well as poor rehabilitation potential [facility] will not accept him back if he is HD dependent (he must be able to sit up in a chair for dialysis.)   Microcytic anemia/Acute anemia superimposed on anemia of chronic kidney disease Patient required 4 units of PRBC to date.  Hemoglobin on 8/26 was 8.5 Continue Aranesp   Severe protein calorie malnutrition Nutrition Problem: Increased nutrient needs Etiology: wound healing Signs/Symptoms: estimated needs Interventions: Tube feeding, Prostat, MVI Estimated body mass index is 28.29 kg/m as calculated from the following:   Height as of this encounter: _0  (1.676 m).   Weight as of this encounter: 79.5 kg.  PEG tube unable to be declogged successfully.  Feeding tube on backorder.  Continue Cortrak for feedings and meds. Patient with sore throat and throat discomfort secondary to cortrack tube.  Chloraseptic Spray ordered.  Pressure injury/stage IV sacral decubitus Stage IV medial/posterior sacrum present on admission.  Right/posterior/lateral ankle unknown if present on admission.  DTI left ear, not present on admission. (For additional documentation see wound care notes)    Data Reviewed: Basic Metabolic Panel: Recent Labs  Lab 09/03/2020 0351 09/11/20 0312 09/13/20 0343 09/15/20 0635  NA 130* 131* 132* 133*  K 4.0 3.9 4.0 3.7  CL 93* 95* 97* 98  CO2 24 20* 25 24  GLUCOSE 84 169* 117* 200*  BUN 72* 89* 62* 53*  CREATININE 1.32* 1.57* 1.24 1.22  CALCIUM 9.4 9.0 8.8* 9.0  MG  --  2.4  --   --   PHOS 4.9* 6.4* 5.2* 5.2*     Liver Function Tests: Recent Labs  Lab 08/22/2020 0351 09/11/20 0312 09/13/20 0343 09/15/20 0635  AST  --  28  --   --   ALT  --  15  --   --   ALKPHOS  --  182*  --   --   BILITOT  --  0.8  --   --   PROT  --  6.9  --   --   ALBUMIN 1.7* 1.7* 1.6* 1.6*    No results for input(s): LIPASE, AMYLASE in the last 168 hours. No results for input(s): AMMONIA in the last 168 hours. CBC: Recent Labs  Lab 08/19/2020 0351 09/11/20 0312 08/26/2020 0356 09/13/20 0343 10/07/2020 0335 09/15/20 0500  WBC 16.9* 14.6* 15.7* 16.1* 17.5* 20.2*  NEUTROABS 12.0*  --   --   --   --   --   HGB 8.3* 8.1* 7.9* 7.6* 7.5* 7.3*  HCT 25.7* 25.7* 25.2* 24.3* 23.9* 22.7*  MCV 80.1 81.1 81.6 81.5 81.6 81.1  PLT 451* 446* 427* 419* 410* 380    Cardiac Enzymes: No results for input(s): CKTOTAL, CKMB, CKMBINDEX, TROPONINI in the last 168 hours. BNP (last 3 results)  Recent Labs    07/09/2020 1922  BNP 135.0*     ProBNP (last 3 results) No results for input(s): PROBNP in the last 8760 hours.  CBG: Recent Labs  Lab 09/30/2020 1926 10/09/2020 2318 09/15/20 0326 09/15/20 0748 09/15/20 1113  GLUCAP 161* 199* 178* 156* 148*        Studies: No results found.  Scheduled Meds:  amiodarone  200 mg Per Tube Daily   buPROPion  75 mg Per Tube BID   chlorhexidine gluconate (MEDLINE KIT)  15 mL Mouth Rinse BID   Chlorhexidine Gluconate Cloth  6 each Topical Daily   [START ON 09/18/2020] darbepoetin (ARANESP) injection - DIALYSIS  100 mcg Intravenous Q Tue-HD   feeding supplement (PROSource TF)  45 mL Per Tube TID   guaiFENesin  5 mL Per Tube BID   insulin aspart  0-9 Units Subcutaneous Q4H   insulin glargine-yfgn  30 Units Subcutaneous Daily   lidocaine  1 patch Transdermal Q24H   mouth rinse  15 mL Mouth Rinse 10 times per day   metaxalone  800 mg Per Tube TID   midodrine  5 mg Per Tube TID   multivitamin  1 tablet Per Tube QHS   mupirocin ointment  1 application Nasal BID   pantoprazole sodium   40 mg Per Tube QHS   scopolamine  1 patch Transdermal Q72H   sertraline  100 mg Per Tube Daily   sodium bicarbonate  650 mg Per Tube Once   sodium chloride flush  10-40 mL Intracatheter Q12H   Continuous Infusions:  sodium chloride 10 mL/hr at 09/24/2020 1429   sodium chloride 500 mL (09/01/20 1408)   sodium chloride     sodium chloride     sodium chloride     feeding supplement (NEPRO CARB STEADY) 1,000 mL (09/13/2020 1707)   heparin 2,250 Units/hr (09/15/20 0731)    Active Problems:   Ventilator dependent (HCC)   Septic shock (HCC)   Pressure injury of skin   Acute respiratory failure with hypoxia (HCC)   AKI (acute kidney injury) ([place])   Hypotension   Goals of care, counseling/discussion   Quadriplegia ([place])   Chronic kidney disease requiring chronic dialysis ([place])   Hyperglycemia   Sepsis due to Acinetobacter baumannii ([place])   Pneumonia due to Acinetobacter species ([place])   Dysautonomia ([place])   Chronic midline thoracic back pain   Consultants: PCCM Nephrology Infectious disease Palliative care medicine  Procedures: Echocardiogram Insertion of double-lumen HD catheter  Antibiotics: Cefepime x1 dose Meropenem 6/29 through 7/2 Vancomycin 6/29 through 6/30 Unasyn 7/2 through 7/15 Unasyn 8/15 >  Time spent: 30 minutes  Darliss Cheney MD  [facility] 66  days

## 2020-09-16 ENCOUNTER — Inpatient Hospital Stay (HOSPITAL_COMMUNITY): Payer: 59

## 2020-09-16 DIAGNOSIS — J9601 Acute respiratory failure with hypoxia: Secondary | ICD-10-CM | POA: Diagnosis not present

## 2020-09-16 DIAGNOSIS — R609 Edema, unspecified: Secondary | ICD-10-CM

## 2020-09-16 LAB — CBC
HCT: 23.1 % — ABNORMAL LOW (ref 39.0–52.0)
Hemoglobin: 7.2 g/dL — ABNORMAL LOW (ref 13.0–17.0)
MCH: 25.5 pg — ABNORMAL LOW (ref 26.0–34.0)
MCHC: 31.2 g/dL (ref 30.0–36.0)
MCV: 81.9 fL (ref 80.0–100.0)
Platelets: 442 10*3/uL — ABNORMAL HIGH (ref 150–400)
RBC: 2.82 MIL/uL — ABNORMAL LOW (ref 4.22–5.81)
RDW: 19.9 % — ABNORMAL HIGH (ref 11.5–15.5)
WBC: 21.7 10*3/uL — ABNORMAL HIGH (ref 4.0–10.5)
nRBC: 1.6 % — ABNORMAL HIGH (ref 0.0–0.2)

## 2020-09-16 LAB — GLUCOSE, CAPILLARY
Glucose-Capillary: 134 mg/dL — ABNORMAL HIGH (ref 70–99)
Glucose-Capillary: 137 mg/dL — ABNORMAL HIGH (ref 70–99)
Glucose-Capillary: 139 mg/dL — ABNORMAL HIGH (ref 70–99)
Glucose-Capillary: 139 mg/dL — ABNORMAL HIGH (ref 70–99)
Glucose-Capillary: 147 mg/dL — ABNORMAL HIGH (ref 70–99)
Glucose-Capillary: 166 mg/dL — ABNORMAL HIGH (ref 70–99)

## 2020-09-16 LAB — IRON AND TIBC
Iron: 27 ug/dL — ABNORMAL LOW (ref 45–182)
Saturation Ratios: 12 % — ABNORMAL LOW (ref 17.9–39.5)
TIBC: 228 ug/dL — ABNORMAL LOW (ref 250–450)
UIBC: 201 ug/dL

## 2020-09-16 LAB — FERRITIN: Ferritin: 282 ng/mL (ref 24–336)

## 2020-09-16 LAB — HEPARIN LEVEL (UNFRACTIONATED): Heparin Unfractionated: 0.51 IU/mL (ref 0.30–0.70)

## 2020-09-16 NOTE — Progress Notes (Signed)
Assisted family with tele-visit via elink 

## 2020-09-16 NOTE — Progress Notes (Signed)
VASCULAR LAB    Left upper extremity venous duplex has been performed.  See CV proc for preliminary results.   Jannine Abreu, RVT 09/16/2020, 11:02 AM

## 2020-09-16 NOTE — Progress Notes (Signed)
Patient ID: Luis Orr, male   DOB: 1950-09-26, 70 y.o.   MRN: [id number] S: no acute events overnight, tolerated hd yesterday, net uf 1.5L O:BP 128/80   Pulse 81   Temp 99.1 F (37.3 C) (Oral)   Resp (!) 22   Ht 5' 6"  (1.676 m)   Wt 79.1 kg   SpO2 100%   BMI 28.15 kg/m   Intake/Output Summary (Last 24 hours) at 09/16/2020 8616 Last data filed at 09/16/2020 0600 Gross per 24 hour  Intake 1315.12 ml  Output 1600 ml  Net -284.88 ml   Intake/Output: I/O last 3 completed shifts: In: 2095.1 [I.V.:505.1; NG/GT:1590] Out: 1600 [Other:1500; Stool:100]  Intake/Output this shift:  No intake/output data recorded. Weight change: -2 kg Gen:on vent via trach OHF:GBMSX at 111 Resp:ventilated bs bilaterally Abd:+BS, soft, NT/ND Ext: no edema HD access: lue avg +b/t, lij tdc  Recent Labs  Lab 08/20/2020 0351 09/11/20 0312 09/13/20 0343 09/15/20 0635  NA 130* 131* 132* 133*  K 4.0 3.9 4.0 3.7  CL 93* 95* 97* 98  CO2 24 20* 25 24  GLUCOSE 84 169* 117* 200*  BUN 72* 89* 62* 53*  CREATININE 1.32* 1.57* 1.24 1.22  ALBUMIN 1.7* 1.7* 1.6* 1.6*  CALCIUM 9.4 9.0 8.8* 9.0  PHOS 4.9* 6.4* 5.2* 5.2*  AST  --  28  --   --   ALT  --  15  --   --    Liver Function Tests: Recent Labs  Lab 09/11/20 0312 09/13/20 0343 09/15/20 0635  AST 28  --   --   ALT 15  --   --   ALKPHOS 182*  --   --   BILITOT 0.8  --   --   PROT 6.9  --   --   ALBUMIN 1.7* 1.6* 1.6*   No results for input(s): LIPASE, AMYLASE in the last 168 hours. No results for input(s): AMMONIA in the last 168 hours. CBC: Recent Labs  Lab 08/28/2020 0351 09/11/20 0312 08/20/2020 0356 09/13/20 0343 09/13/2020 0335 09/15/20 0500 09/16/20 0500  WBC 16.9*   < > 15.7* 16.1* 17.5* 20.2* 21.7*  NEUTROABS 12.0*  --   --   --   --   --   --   HGB 8.3*   < > 7.9* 7.6* 7.5* 7.3* 7.2*  HCT 25.7*   < > 25.2* 24.3* 23.9* 22.7* 23.1*  MCV 80.1   < > 81.6 81.5 81.6 81.1 81.9  PLT 451*   < > 427* 419* 410* 380 442*   < > = values  in this interval not displayed.   Cardiac Enzymes: No results for input(s): CKTOTAL, CKMB, CKMBINDEX, TROPONINI in the last 168 hours. CBG: Recent Labs  Lab 09/15/20 1113 09/15/20 1538 09/15/20 1939 09/15/20 2329 09/16/20 0332  GLUCAP 148* 172* 160* 161* 134*    Iron Studies:  Recent Labs    09/16/20 0500  IRON 27*  TIBC 228*  FERRITIN 282   Studies/Results: No results found.  amiodarone  200 mg Per Tube Daily   buPROPion  75 mg Per Tube BID   chlorhexidine gluconate (MEDLINE KIT)  15 mL Mouth Rinse BID   Chlorhexidine Gluconate Cloth  6 each Topical Daily   [START ON 09/18/2020] darbepoetin (ARANESP) injection - DIALYSIS  100 mcg Intravenous Q Tue-HD   feeding supplement (PROSource TF)  45 mL Per Tube TID   glycopyrrolate  1 mg Per Tube BID   guaiFENesin  5 mL Per Tube BID  insulin aspart  0-9 Units Subcutaneous Q4H   insulin glargine-yfgn  30 Units Subcutaneous Daily   lidocaine  1 patch Transdermal Q24H   mouth rinse  15 mL Mouth Rinse 10 times per day   metaxalone  800 mg Per Tube TID   midodrine  5 mg Per Tube TID   multivitamin  1 tablet Per Tube QHS   mupirocin ointment  1 application Nasal BID   pantoprazole sodium  40 mg Per Tube QHS   scopolamine  1 patch Transdermal Q72H   sertraline  100 mg Per Tube Daily   sodium bicarbonate  650 mg Per Tube Once   sodium chloride flush  10-40 mL Intracatheter Q12H    BMET    Component Value Date/Time   NA 133 (L) 09/15/2020 0635   K 3.7 09/15/2020 0635   CL 98 09/15/2020 0635   CO2 24 09/15/2020 0635   GLUCOSE 200 (H) 09/15/2020 0635   BUN 53 (H) 09/15/2020 0635   CREATININE 1.22 09/15/2020 0635   CALCIUM 9.0 09/15/2020 0635   GFRNONAA >60 09/15/2020 0635   GFRAA NOT CALCULATED 10/11/2019 0226   CBC    Component Value Date/Time   WBC 21.7 (H) 09/16/2020 0500   RBC 2.82 (L) 09/16/2020 0500   HGB 7.2 (L) 09/16/2020 0500   HCT 23.1 (L) 09/16/2020 0500   PLT 442 (H) 09/16/2020 0500   MCV 81.9 09/16/2020  0500   MCH 25.5 (L) 09/16/2020 0500   MCHC 31.2 09/16/2020 0500   RDW 19.9 (H) 09/16/2020 0500   LYMPHSABS 2.6 09/01/2020 0351   MONOABS 1.6 (H) 08/20/2020 0351   EOSABS 0.2 08/27/2020 0351   BASOSABS 0.1 09/08/2020 0351      Assessment/Plan:   AKI now ESRD - initiated on CRRT 07/18/20 and transitioned to IHD.  Has TDC but required a AVF/AVG for out of state LTC facility.  Continue with HD on TTS schedule for now. Septic shock/VDRF associated PNA.  Off antibiotics.   VDRF - s/p trach, plan per PCCM.  Remains on vent.  Quadriplegia - due to compressive myelopathy involving C3-C5. CKD-BMD stable Anemia - increase esa to 166mg 9/6, repeat iron panel Acute metabolic encephalopathy - continue with supportive care Vascular access - had left forearm AVG placed 08/27/2020 but unfortunately had clotted.  s/p thrombectomy and revision 9/2, graft can be used 4 weeks thereafter Disposition - pt cannot return to [facility] now that he is ESRD and will need AVG placement before out of state SNF will accept which has now been done.  May need to discuss goals of care again with family/guardian/HCPOA if he starts having issues with IHD again  VGean Quint MD [place]

## 2020-09-16 NOTE — Progress Notes (Signed)
ANTICOAGULATION CONSULT NOTE   Pharmacy Consult for Heparin Indication: atrial fibrillation while apixaban on hold  Allergies  Allergen Reactions   Adhesive [Tape] Rash    Patient Measurements: Height: '5\' 6"'$  (167.6 cm) Weight: 79.1 kg (174 lb 6.1 oz) IBW/kg (Calculated) : 63.8 Heparin Dosing Weight: 74.6 kg   Vital Signs: Temp: 98.7 F (37.1 C) (09/04 0700) Temp Source: Oral (09/04 0700) BP: 128/80 (09/04 0600) Pulse Rate: 81 (09/04 0600)  Labs: Recent Labs    09/17/2020 0335 09/20/2020 0339 10/08/2020 1343 09/15/20 0500 09/15/20 0635 09/15/20 1600 09/16/20 0500  HGB 7.5*  --   --  7.3*  --   --  7.2*  HCT 23.9*  --   --  22.7*  --   --  23.1*  PLT 410*  --   --  380  --   --  442*  HEPARINUNFRC  --    < > 0.32  --   --  0.56 0.51  CREATININE  --   --   --   --  1.22  --   --    < > = values in this interval not displayed.    Estimated Creatinine Clearance: 55.7 mL/min (by C-G formula based on SCr of 1.22 mg/dL).   Medical History: Past Medical History:  Diagnosis Date   Anemia    Anxiety    Chronic respiratory failure (HCC)    DM2 (diabetes mellitus, type 2) (HCC)    Functional quadriplegia (HCC)    Hypertension    Multiple drug resistant organism (MDRO) culture positive    Urine retention    UTI (urinary tract infection)     Assessment: 70 yo male admitted 07/06/2020 on apixaban for Afib. Pharmacy consulted to dose heparin with oral and peri tracheal bleeding. Minimal bleeding in mouth and around trach site - per RN possibly from patient biting lip. No other bleeding noted. Last dose apixaban 8/24 at 1430 (doses on 8/24 PM held per MD).   Heparin infusion was stopped during procedure on 9/2 (underwent revision of L forearm dialysis graft) and resumed on 9/3 at 0800.  Heparin drip is running at 2250 units/hr - Heparin level remains therapeutic.  Upper extremity Doppler ordered due to new swelling on L-arm.   Goal of Therapy:  Heparin level 0.3-0.7  units/mL Monitor platelets by anticoagulation protocol: Yes   Plan:  Continue heparin at 2250 units/hr  Monitor daily HL, CBC, and for s/sx of bleeding   Sloan Leiter, PharmD, BCPS, BCCCP Clinical Pharmacist Please refer to [facility] for [place] numbers 09/16/2020 10:14 AM   Please check AMION for all [place] phone numbers After 10:00 PM, call [place] [phone]

## 2020-09-16 NOTE — Progress Notes (Signed)
[facility] PROGRESS NOTE  [name] GNF:[id number] DOB: 1950-07-04 DOA: 07/09/2020 PCP: Townsend Roger, MD  Status: Remains inpatient appropriate because:Hemodynamically unstable, Persistent severe electrolyte disturbances, Unsafe d/c plan, and Inpatient level of care appropriate due to severity of illness  Dispo: The patient is from: [facility] vent capable              Anticipated d/c is to: SNF Vent capable w/ access to in facility/stretcher based HD-a facility in [place] has been located in preauthorization in progress.              Patient currently is not medically stable to d/c.   Difficult to place patient Yes    Level of care: ICU  Code Status: Partial (no CPR and no defibrillation or cardioversion) Family Communication: Wife June on 8/22.  Of note the wife is also the POA DVT prophylaxis: IV Heparin COVID vaccination status: Unknown    HPI:  70 y.o. 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 Patient presented secondary to altered mental status and hypotension with recurrent pneumonia requiring vasopressor support and ICU admission.he has had multiple recent admissions for sepsis secondary to various infections including UTIs, pneumonias and bacteremia in setting of MDR Klebsiella, MRSA and pseudomonas. Most recent admission September 2021 in setting of sepsis due to MDR Klebsiella with right ureteral calculus s/p double J ureteral stent placement and treated with meropenem x 10days. He was discharged back to [facility].  ICU significant events: 6/29 admitted to ICU for septic shock on levo to maintain MAP>65, continued on vent support 6/29 Blood Cx>> staph species in 1 of 4 cultures drawn>> suspect contaminant 6/30 Off pressors 7/2 ID Consult for MDR acinetobacter in trach asp, Meropenem changed to unasyn 7/2 PRBC transfusion, iron replacement 7/5 Pressors added again. Central line placed.  Drainage from gastrostomy tube >> CT Abd without fluid collection around gastrostomy tube but with diffuse body wall edema. Echo EF 50-55%, mild LVH 7/6 Renal Replacement Therapy ; Code status changed to partial Code (DNR) 7/7 Transfuse 1U PRBC 7/9 add solu cortef 7/10 off pressors, weaning stress steroids 7/11 Pressors added back on again 7/12 ID consult due to rising WBC, hypothermia, hypotension concern for worsening infection. Vancomycin added. Repeat blood cx collected. 7/15 CRRT stopped 7/17 Restarted on low dose levo. No evidence of new infection. Held off on antibiotics. Nephrology contacted family and decision was made to continue iHD this week to allow additional time for renal recovery. Patient is not the best long term iHD candidate but family wants aggressive care 7/18 plans for iHD, tolerated with 2L removed 719 slight drop in hgb to 6.9 will transfuse 1 unit PRBC 7/22 > 7/26: Tolerating HD. TOC consulted for placement. Will likely need out of state placement. 8/15 WBCs greater than 28,000, hypoglycemia, procalcitonin >18, chest x-ray consistent with pneumonia, recent blood cultures consistent with contamination.  Unasyn IV resumed after sputum culture obtained 8/16 consultation placed with ethics committee regarding evaluation for futility of care 8/16 Sputum cx + for Acinetobacter with sensitivities pending 8/17 extensive conversation held with patient's wife regarding patient's poor prognosis and reemergence of Acinetobacter VAP.  Also discussed with her that patient has been able to convey to multiple staff that he does not want to continue on the ventilator or with dialysis.  Wife reports that the patient does not understand what he is saying, she is the POA and that she wants to continue aggressive care. 8/18 patient  now telling nephrology, myself and the rest of the staff he replies yes to wanting to remain on dialysis and on the ventilator and this direct question is  asked. 8/18 Acinetobacter sensitive to Unasyn only.  Discussed with ID/Dr. Juleen China.  Plan is to administer antibiotics for 7-day since patient has not had any increased O2 needs and remains afebrile.  If any concerns over worsening symptoms can give an additional 3 days. Developed moderate bleeding around tracheostomy site on 8/19.  Likely related to combination of Eliquis and heparin required for CRRT.  We will hold Eliquis for now and monitor degree of bleeding Eliquis resumed on 8/22.  Overnight into 8/23 patient bit his tongue and had bleeding.  Also had some bleeding around trach likely from oral bleeding.  Also though did have bleeding from old central line site. 8/23 had transient bleeding around trach and prior central line insertion site overnight.  Patient had bitten tongue and this likely explain the bleeding.  This morning patient was not having any bleeding.  On 8/25 had redeveloped slow ooze bleeding from mouth and trach.  Eliquis discontinued in favor of IV heparin especially given patient will undergo surgical procedure for vascular access on 8/29 8/30 Left FA AVG occluded-.VVS plans to place new access   Subjective: Seen and examined.  Patient is nonverbal.  Primary RN at the bedside brought to my attention about swelling and firmness of his left upper extremity.  He had some thrill over there though and good pulses.  Objective: Vitals:   09/16/20 0700 09/16/20 0727  BP:    Pulse:    Resp:    Temp: 98.7 F (37.1 C)   SpO2:  100%    Intake/Output Summary (Last 24 hours) at 09/16/2020 1038 Last data filed at 09/16/2020 0600 Gross per 24 hour  Intake 1225.12 ml  Output 1600 ml  Net -374.88 ml    Filed Weights   09/15/20 0700 09/15/20 1100 09/16/20 0400  Weight: 79.5 kg 77.5 kg 79.1 kg    Exam:  General exam: Appears calm and comfortable, trach in place. Respiratory system: Coarse breath sounds bilaterally. Respiratory effort normal. Cardiovascular system: S1 & S2  heard, RRR. No JVD, murmurs, rubs, gallops or clicks. No pedal edema. Gastrointestinal system: Abdomen is nondistended, soft and nontender. No organomegaly or masses felt. Normal bowel sounds heard. Central nervous system: Alert, unable to assess orientation.  Patient nonverbal and quadriplegic. 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 Acute sx's secondary to pneumonia/volume overload - redeveloped Acinetobacter pneumonia (8/14) and completed 7 days of Unasyn Ventilator management per PCCM  Recurrent ventilator associated pneumonia 2/2 recurrent Acinetobacter/sepsis without shock WBCs stable with positive sputum culture sensitive only to Unasyn ID/Dr Juleen China recommended 7 days of therapy -completed on 8/22   AKI with oliguria/volume overload/ New CKD requiring HD Anuric Nephrology following, initially required CRRT and has transitioned to HD (due to staffing issues continues on CRRT at bedside) [facility] placed on 7/26-Left FA AVG graft placed but was occluded so we VVS performed thrombectomy and revision of left forearm AV graft on 09/16/2020.   Septic shock 2/2 VAP POA Presented with sepsis physiology and profound shock which has now resolved  Chronic thoracic back pain Continue Oxy and fentanyl prn; Skelaxin and lidocaine patch  History of paroxysmal atrial fibrillation Patient was on amiodarone prior to admission -maintaining SR Eliquis discontinued 2/2  recurrent peritracheal /oral bleeding.   No further bleeding on IV heparin.  Will reassess on Monday for consideration of resuming Eliquis. TSH was WNL this admission QTc 420 ms on 9/24   Acute metabolic encephalopathy Resolved Secondary to acute illness. CT head negative for acute process.  Back at baseline.   Diabetes mellitus, type 2 Blood sugar fairly  controlled.  Continue Semglee 30 units daily.  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  Resolved.  Quadriplegia secondary to compressive myelopathy.  Previously resided at [facility] but with poor performance scores as well as poor rehabilitation potential [facility] will not accept him back if he is HD dependent (he must be able to sit up in a chair for dialysis.)   Microcytic anemia/Acute anemia superimposed on anemia of chronic kidney disease Patient required 4 units of PRBC to date.  Hemoglobin on 8/26 was 8.5 Continue Aranesp  Left upper extremity edema/firmness: Concerning for DVT since patient is not on anticoagulation.  Will obtain Doppler upper extremity stat.   Severe protein calorie malnutrition Nutrition Problem: Increased nutrient needs Etiology: wound healing Signs/Symptoms: estimated needs Interventions: Tube feeding, Prostat, MVI Estimated body mass index is 28.15 kg/m as calculated from the following:   Height as of this encounter: $RemoveBeforeD'5\' 6"'[id number]$  (1.676 m).   Weight as of this encounter: 79.1 kg.  PEG tube unable to be declogged successfully.  Feeding tube on backorder.  Continue Cortrak for feedings and meds. Patient with sore throat and throat discomfort secondary to cortrack tube.  Chloraseptic Spray ordered.  Pressure injury/stage IV sacral decubitus Stage IV medial/posterior sacrum present on admission.  Right/posterior/lateral ankle unknown if present on admission.  DTI left ear, not present on admission. (For additional documentation see wound care notes)    Data Reviewed: Basic Metabolic Panel: Recent Labs  Lab 09/11/2020 0351 09/11/20 0312 09/13/20 0343 09/15/20 0635  NA 130* 131* 132* 133*  K 4.0 3.9 4.0 3.7  CL  93* 95* 97* 98  CO2 24 20* 25 24  GLUCOSE 84 169* 117* 200*  BUN 72* 89* 62* 53*  CREATININE 1.32* 1.57* 1.24 1.22  CALCIUM 9.4 9.0 8.8* 9.0  MG  --  2.4  --   --   PHOS 4.9* 6.4* 5.2* 5.2*    Liver Function Tests: Recent Labs  Lab 09/03/2020 0351 09/11/20 0312 09/13/20 0343 09/15/20 0635  AST  --  28  --   --   ALT  --  15  --   --   ALKPHOS  --  182*  --   --   BILITOT  --  0.8  --   --   PROT  --  6.9  --   --   ALBUMIN 1.7* 1.7* 1.6* 1.6*    No results for input(s): LIPASE, AMYLASE in the last 168 hours. No results for input(s): AMMONIA in the last 168 hours. CBC: Recent Labs  Lab 08/18/2020 0351 09/11/20 0312 08/22/2020 0356 09/13/20 0343 09/13/2020 0335 09/15/20 0500 09/16/20 0500  WBC 16.9*   < > 15.7* 16.1* 17.5* 20.2* 21.7*  NEUTROABS 12.0*  --   --   --   --   --   --   HGB 8.3*   < > 7.9* 7.6* 7.5* 7.3* 7.2*  HCT 25.7*   < >  25.2* 24.3* 23.9* 22.7* 23.1*  MCV 80.1   < > 81.6 81.5 81.6 81.1 81.9  PLT 451*   < > 427* 419* 410* 380 442*   < > = values in this interval not displayed.    Cardiac Enzymes: No results for input(s): CKTOTAL, CKMB, CKMBINDEX, TROPONINI in the last 168 hours. BNP (last 3 results) Recent Labs    06/28/2020 1922  BNP 135.0*     ProBNP (last 3 results) No results for input(s): PROBNP in the last 8760 hours.  CBG: Recent Labs  Lab 09/15/20 1538 09/15/20 1939 09/15/20 2329 09/16/20 0332 09/16/20 0808  GLUCAP 172* 160* 161* 134* 139*        Studies: No results found.  Scheduled Meds:  amiodarone  200 mg Per Tube Daily   buPROPion  75 mg Per Tube BID   chlorhexidine gluconate (MEDLINE KIT)  15 mL Mouth Rinse BID   Chlorhexidine Gluconate Cloth  6 each Topical Daily   [START ON 09/18/2020] darbepoetin (ARANESP) injection - DIALYSIS  100 mcg Intravenous Q Tue-HD   feeding supplement (PROSource TF)  45 mL Per Tube TID   glycopyrrolate  1 mg Per Tube BID   guaiFENesin  5 mL Per Tube BID   insulin aspart  0-9 Units  Subcutaneous Q4H   insulin glargine-yfgn  30 Units Subcutaneous Daily   lidocaine  1 patch Transdermal Q24H   mouth rinse  15 mL Mouth Rinse 10 times per day   metaxalone  800 mg Per Tube TID   midodrine  5 mg Per Tube TID   multivitamin  1 tablet Per Tube QHS   mupirocin ointment  1 application Nasal BID   pantoprazole sodium  40 mg Per Tube QHS   scopolamine  1 patch Transdermal Q72H   sertraline  100 mg Per Tube Daily   sodium bicarbonate  650 mg Per Tube Once   sodium chloride flush  10-40 mL Intracatheter Q12H   Continuous Infusions:  sodium chloride 10 mL/hr at 09/13/2020 1429   sodium chloride 500 mL (09/01/20 1408)   sodium chloride     sodium chloride     sodium chloride     feeding supplement (NEPRO CARB STEADY) 45 mL/hr at 09/15/20 2000   heparin 2,250 Units/hr (09/16/20 0600)    Active Problems:   Ventilator dependent (HCC)   Septic shock (HCC)   Pressure injury of skin   Acute respiratory failure with hypoxia (HCC)   AKI (acute kidney injury) ([place])   Hypotension   Goals of care, counseling/discussion   Quadriplegia ([place])   Chronic kidney disease requiring chronic dialysis ([place])   Hyperglycemia   Sepsis due to Acinetobacter baumannii ([place])   Pneumonia due to Acinetobacter species ([place])   Dysautonomia ([place])   Chronic midline thoracic back pain   Consultants: PCCM Nephrology Infectious disease Palliative care medicine  Procedures: Echocardiogram Insertion of double-lumen HD catheter  Antibiotics: Cefepime x1 dose Meropenem 6/29 through 7/2 Vancomycin 6/29 through 6/30 Unasyn 7/2 through 7/15 Unasyn 8/15 >  Time spent: 30 minutes  Darliss Cheney MD  [facility] 67  days

## 2020-09-17 DIAGNOSIS — A419 Sepsis, unspecified organism: Secondary | ICD-10-CM | POA: Diagnosis not present

## 2020-09-17 DIAGNOSIS — Z9911 Dependence on respirator [ventilator] status: Secondary | ICD-10-CM | POA: Diagnosis not present

## 2020-09-17 DIAGNOSIS — G8250 Quadriplegia, unspecified: Secondary | ICD-10-CM | POA: Diagnosis not present

## 2020-09-17 DIAGNOSIS — A4159 Other Gram-negative sepsis: Secondary | ICD-10-CM | POA: Diagnosis not present

## 2020-09-17 LAB — PREPARE RBC (CROSSMATCH)

## 2020-09-17 LAB — GLUCOSE, CAPILLARY
Glucose-Capillary: 111 mg/dL — ABNORMAL HIGH (ref 70–99)
Glucose-Capillary: 122 mg/dL — ABNORMAL HIGH (ref 70–99)
Glucose-Capillary: 137 mg/dL — ABNORMAL HIGH (ref 70–99)
Glucose-Capillary: 139 mg/dL — ABNORMAL HIGH (ref 70–99)
Glucose-Capillary: 154 mg/dL — ABNORMAL HIGH (ref 70–99)
Glucose-Capillary: 156 mg/dL — ABNORMAL HIGH (ref 70–99)

## 2020-09-17 LAB — HEPARIN LEVEL (UNFRACTIONATED): Heparin Unfractionated: 0.67 IU/mL (ref 0.30–0.70)

## 2020-09-17 LAB — CBC
HCT: 22.1 % — ABNORMAL LOW (ref 39.0–52.0)
Hemoglobin: 7 g/dL — ABNORMAL LOW (ref 13.0–17.0)
MCH: 25.5 pg — ABNORMAL LOW (ref 26.0–34.0)
MCHC: 31.7 g/dL (ref 30.0–36.0)
MCV: 80.7 fL (ref 80.0–100.0)
Platelets: 381 10*3/uL (ref 150–400)
RBC: 2.74 MIL/uL — ABNORMAL LOW (ref 4.22–5.81)
RDW: 20.1 % — ABNORMAL HIGH (ref 11.5–15.5)
WBC: 20 10*3/uL — ABNORMAL HIGH (ref 4.0–10.5)
nRBC: 1.3 % — ABNORMAL HIGH (ref 0.0–0.2)

## 2020-09-17 MED ORDER — SODIUM CHLORIDE 0.9% IV SOLUTION: Freq: Once | INTRAVENOUS | Status: AC

## 2020-09-17 MED ORDER — SIMETHICONE 40 MG/0.6ML PO SUSP
40.0000 mg | Freq: Four times a day (QID) | ORAL | Status: DC
  Administered 2020-09-17 – 2020-10-20 (×128): 40 mg
  Filled 2020-09-17 (×135): qty 0.6

## 2020-09-17 MED ORDER — DIPHENHYDRAMINE HCL 25 MG PO CAPS
25.0000 mg | ORAL_CAPSULE | Freq: Once | ORAL | Status: DC
  Filled 2020-09-17: qty 1

## 2020-09-17 MED ORDER — CHLORHEXIDINE GLUCONATE CLOTH 2 % EX PADS
6.0000 | MEDICATED_PAD | Freq: Every day | CUTANEOUS | Status: DC
  Administered 2020-09-18 – 2020-09-19 (×2): 6 via TOPICAL

## 2020-09-17 MED ORDER — DIPHENHYDRAMINE HCL 50 MG/ML IJ SOLN
25.0000 mg | Freq: Once | INTRAMUSCULAR | Status: AC
  Administered 2020-09-17: 25 mg via INTRAVENOUS

## 2020-09-17 MED ORDER — ACETAMINOPHEN 325 MG PO TABS
650.0000 mg | ORAL_TABLET | Freq: Once | ORAL | Status: AC
  Administered 2020-09-17: 650 mg via ORAL
  Filled 2020-09-17: qty 2

## 2020-09-17 MED ORDER — APIXABAN 5 MG PO TABS
5.0000 mg | ORAL_TABLET | Freq: Two times a day (BID) | ORAL | Status: DC
  Administered 2020-09-17 – 2020-10-17 (×61): 5 mg
  Filled 2020-09-17 (×5): qty 1
  Filled 2020-09-17: qty 2
  Filled 2020-09-17 (×7): qty 1
  Filled 2020-09-17: qty 2
  Filled 2020-09-17 (×9): qty 1
  Filled 2020-09-17: qty 2
  Filled 2020-09-17 (×37): qty 1

## 2020-09-17 NOTE — Progress Notes (Signed)
[facility] NEPHROLOGY PROGRESS NOTE  Assessment/ Plan:  # Acute kidney Injury-prolonged dialysis dependency, now progressed to ESRD:  ATN from septic shock-without any evidence of renal recovery to date . initiated on CRRT 07/18/20 and transitioned to IHD.  Has TDC, continue with HD on TTS schedule for now.  # Vascular access - had left forearm AVG placed 08/13/2020 but unfortunately had clotted.  s/p thrombectomy and revision 9/2, graft can be used 4 weeks thereafter.  # Septic shock/ventilator associated pneumonia due to recurrent Acinetobacter infection: Off antibiotics and stable hemodynamics.  #  Chronic respiratory failure: Status post tracheostomy with ventilator management per CCM.  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  # Chronic kidney disease/metabolic bone disease: Calcium and phosphorus level under control with hemodialysis, off binders  # Anemia: contiue ESA, monitor Hb.  Transfuse as needed.  # Acute metabolic encephalopathy: Continue management as above.,  Supportive care.  # Disposition - pt cannot return to [facility] now that he is ESRD, out of state SNF will accept which has now been done.  May need to discuss goals of care again with family/guardian/HCPOA if he starts having issues with IHD again  Subjective: Seen and examined in ICU.  No urine output.  Remains on vent.  No new event.    Objective Vital signs in last 24 hours: Vitals:   09/17/20 0400 09/17/20 0500 09/17/20 0600 09/17/20 0700  BP: 128/77 135/72 134/70 (!) 146/80  Pulse: 73 76  75  Resp: 20 20 20 20   Temp:      TempSrc:      SpO2: 99% 98% 99% 98%  Weight:   82.2 kg   Height:       Weight change: 4.7 kg  Intake/Output Summary (Last 24 hours) at 09/17/2020 0749 Last data filed at 09/17/2020 0500 Gross per 24 hour  Intake 1686.99 ml  Output 225 ml  Net 1461.99 ml        Labs: Basic Metabolic Panel: Recent Labs  Lab  09/11/20 0312 09/13/20 0343 09/15/20 0635  NA 131* 132* 133*  K 3.9 4.0 3.7  CL 95* 97* 98  CO2 20* 25 24  GLUCOSE 169* 117* 200*  BUN 89* 62* 53*  CREATININE 1.57* 1.24 1.22  CALCIUM 9.0 8.8* 9.0  PHOS 6.4* 5.2* 5.2*    Liver Function Tests: Recent Labs  Lab 09/11/20 0312 09/13/20 0343 09/15/20 0635  AST 28  --   --   ALT 15  --   --   ALKPHOS 182*  --   --   BILITOT 0.8  --   --   PROT 6.9  --   --   ALBUMIN 1.7* 1.6* 1.6*    No results for input(s): LIPASE, AMYLASE in the last 168 hours. No results for input(s): AMMONIA in the last 168 hours. CBC: Recent Labs  Lab 09/13/20 0343 09/17/2020 0335 09/15/20 0500 09/16/20 0500 09/17/20 0457  WBC 16.1* 17.5* 20.2* 21.7* 20.0*  HGB 7.6* 7.5* 7.3* 7.2* 7.0*  HCT 24.3* 23.9* 22.7* 23.1* 22.1*  MCV 81.5 81.6 81.1 81.9 80.7  PLT 419* 410* 380 442* 381    Cardiac Enzymes: No results for input(s): CKTOTAL, CKMB, CKMBINDEX, TROPONINI in the last 168 hours. CBG: Recent Labs  Lab 09/16/20 1122 09/16/20 1553 09/16/20 1952 09/16/20 2333 09/17/20 0335  GLUCAP 166* 137* 147* 139* 122*     Iron Studies:  Recent Labs    09/16/20 0500  IRON 27*  TIBC 228*  FERRITIN 282   Studies/Results: VAS [place] UPPER EXTREMITY VENOUS DUPLEX  Result Date: 09/16/2020 UPPER VENOUS STUDY  Patient Name:  Luis Orr  Date of Exam:   09/16/2020 Medical Rec #: [id number]          Accession #:    [id number] Date of Birth: [birth year]         Patient Gender: M Patient Age:   70 years Exam Location:  [facility] Procedure:      VAS [place] UPPER EXTREMITY VENOUS DUPLEX Referring Phys: Darliss Cheney --------------------------------------------------------------------------------  Indications: Edema Other Indications: S/P thrombectomy and revision of left forearm loop graft 09/19/2020. Limitations: Bandages and edema, left subclavian central line, trach collar. Comparison Study: No prior study on file Performing Technologist: Sharion Dove RVS   Examination Guidelines: A complete evaluation includes B-mode imaging, spectral Doppler, color Doppler, and power Doppler as needed of all accessible portions of each vessel. Bilateral testing is considered an integral part of a complete examination. Limited examinations for reoccurring indications may be performed as noted.  Right Findings: +----------+------------+---------+-----------+----------+-------+ RIGHT     CompressiblePhasicitySpontaneousPropertiesSummary +----------+------------+---------+-----------+----------+-------+ Subclavian               Yes       Yes                      +----------+------------+---------+-----------+----------+-------+  Left Findings: +----------+------------+---------+-----------+----------+--------------+ LEFT      CompressiblePhasicitySpontaneousProperties   Summary     +----------+------------+---------+-----------+----------+--------------+ IJV                                                 Not visualized +----------+------------+---------+-----------+----------+--------------+ Subclavian               Yes       Yes                             +----------+------------+---------+-----------+----------+--------------+ Axillary                 Yes       Yes                             +----------+------------+---------+-----------+----------+--------------+ Brachial                                                patent     +----------+------------+---------+-----------+----------+--------------+ Radial        Full                                                 +----------+------------+---------+-----------+----------+--------------+ Ulnar                                               Not visualized +----------+------------+---------+-----------+----------+--------------+ Cephalic      Full                                                  +----------+------------+---------+-----------+----------+--------------+  Basilic                                             Not visualized +----------+------------+---------+-----------+----------+--------------+  Summary:  Right: No evidence of thrombosis in the subclavian.  Left: No obvious evidence of DVT or SVT noted in the visualized veins of the left upper extremity. Graft appears patent.  *See table(s) above for measurements and observations.  Diagnosing physician: Deitra Mayo MD Electronically signed by Deitra Mayo MD on 09/16/2020 at 1:55:22 PM.    Final     Medications: Infusions:  sodium chloride 10 mL/hr at 10/02/2020 1429   sodium chloride 500 mL (09/01/20 1408)   sodium chloride     sodium chloride     sodium chloride     feeding supplement (NEPRO CARB STEADY) 1,000 mL (09/16/20 1630)   heparin 2,250 Units/hr (09/17/20 0500)    Scheduled Medications:  sodium chloride   Intravenous Once   acetaminophen  650 mg Oral Once   amiodarone  200 mg Per Tube Daily   buPROPion  75 mg Per Tube BID   chlorhexidine gluconate (MEDLINE KIT)  15 mL Mouth Rinse BID   Chlorhexidine Gluconate Cloth  6 each Topical Daily   [START ON 09/18/2020] darbepoetin (ARANESP) injection - DIALYSIS  100 mcg Intravenous Q Tue-HD   diphenhydrAMINE  25 mg Oral Once   feeding supplement (PROSource TF)  45 mL Per Tube TID   glycopyrrolate  1 mg Per Tube BID   guaiFENesin  5 mL Per Tube BID   insulin aspart  0-9 Units Subcutaneous Q4H   insulin glargine-yfgn  30 Units Subcutaneous Daily   lidocaine  1 patch Transdermal Q24H   mouth rinse  15 mL Mouth Rinse 10 times per day   metaxalone  800 mg Per Tube TID   midodrine  5 mg Per Tube TID   multivitamin  1 tablet Per Tube QHS   mupirocin ointment  1 application Nasal BID   pantoprazole sodium  40 mg Per Tube QHS   scopolamine  1 patch Transdermal Q72H   sertraline  100 mg Per Tube Daily   sodium bicarbonate  650 mg Per Tube Once   sodium  chloride flush  10-40 mL Intracatheter Q12H    have reviewed scheduled and prn medications.  Physical Exam: General:NAD, able to open eyes, on tracheostomy and vent. Heart:RRR, s1s2 nl Lungs: Coarse breath sound bilateral, no wheezing Abdomen:soft, Non-tender, PEG tube and ostomy bag Extremities:Trace edema Dialysis Access: LIJ TDC, LUE AVG T/B+  Luis Orr Luis Orr 09/17/2020,7:49 AM  LOS: 68 days

## 2020-09-17 NOTE — Progress Notes (Addendum)
[facility] PROGRESS NOTE  Luis Orr GNF:[id number] DOB: 1950-07-04 DOA: 06/18/2020 PCP: Townsend Roger, MD  Status: Remains inpatient appropriate because:Hemodynamically unstable, Persistent severe electrolyte disturbances, Unsafe d/c plan, and Inpatient level of care appropriate due to severity of illness  Dispo: The patient is from: [facility] vent capable              Anticipated d/c is to: SNF Vent capable w/ access to in facility/stretcher based HD-a facility in [place] has been located in preauthorization in progress.              Patient currently is not medically stable to d/c.   Difficult to place patient Yes    Level of care: ICU  Code Status: Partial (no CPR and no defibrillation or cardioversion) Family Communication: Wife June on 8/22.  Of note the wife is also the POA DVT prophylaxis: IV Heparin COVID vaccination status: Unknown    HPI:  70 y.o. 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 Patient presented secondary to altered mental status and hypotension with recurrent pneumonia requiring vasopressor support and ICU admission.he has had multiple recent admissions for sepsis secondary to various infections including UTIs, pneumonias and bacteremia in setting of MDR Klebsiella, MRSA and pseudomonas. Most recent admission September 2021 in setting of sepsis due to MDR Klebsiella with right ureteral calculus s/p double J ureteral stent placement and treated with meropenem x 10days. He was discharged back to [facility].  ICU significant events: 6/29 admitted to ICU for septic shock on levo to maintain MAP>65, continued on vent support 6/29 Blood Cx>> staph species in 1 of 4 cultures drawn>> suspect contaminant 6/30 Off pressors 7/2 ID Consult for MDR acinetobacter in trach asp, Meropenem changed to unasyn 7/2 PRBC transfusion, iron replacement 7/5 Pressors added again. Central line placed.  Drainage from gastrostomy tube >> CT Abd without fluid collection around gastrostomy tube but with diffuse body wall edema. Echo EF 50-55%, mild LVH 7/6 Renal Replacement Therapy ; Code status changed to partial Code (DNR) 7/7 Transfuse 1U PRBC 7/9 add solu cortef 7/10 off pressors, weaning stress steroids 7/11 Pressors added back on again 7/12 ID consult due to rising WBC, hypothermia, hypotension concern for worsening infection. Vancomycin added. Repeat blood cx collected. 7/15 CRRT stopped 7/17 Restarted on low dose levo. No evidence of new infection. Held off on antibiotics. Nephrology contacted family and decision was made to continue iHD this week to allow additional time for renal recovery. Patient is not the best long term iHD candidate but family wants aggressive care 7/18 plans for iHD, tolerated with 2L removed 719 slight drop in hgb to 6.9 will transfuse 1 unit PRBC 7/22 > 7/26: Tolerating HD. TOC consulted for placement. Will likely need out of state placement. 8/15 WBCs greater than 28,000, hypoglycemia, procalcitonin >18, chest x-ray consistent with pneumonia, recent blood cultures consistent with contamination.  Unasyn IV resumed after sputum culture obtained 8/16 consultation placed with ethics committee regarding evaluation for futility of care 8/16 Sputum cx + for Acinetobacter with sensitivities pending 8/17 extensive conversation held with patient's wife regarding patient's poor prognosis and reemergence of Acinetobacter VAP.  Also discussed with her that patient has been able to convey to multiple staff that he does not want to continue on the ventilator or with dialysis.  Wife reports that the patient does not understand what he is saying, she is the POA and that she wants to continue aggressive care. 8/18 patient  now telling nephrology, myself and the rest of the staff he replies yes to wanting to remain on dialysis and on the ventilator and this direct question is  asked. 8/18 Acinetobacter sensitive to Unasyn only.  Discussed with ID/Dr. Juleen China.  Plan is to administer antibiotics for 7-day since patient has not had any increased O2 needs and remains afebrile.  If any concerns over worsening symptoms can give an additional 3 days. Developed moderate bleeding around tracheostomy site on 8/19.  Likely related to combination of Eliquis and heparin required for CRRT.  We will hold Eliquis for now and monitor degree of bleeding Eliquis resumed on 8/22.  Overnight into 8/23 patient bit his tongue and had bleeding.  Also had some bleeding around trach likely from oral bleeding.  Also though did have bleeding from old central line site. 8/23 had transient bleeding around trach and prior central line insertion site overnight.  Patient had bitten tongue and this likely explain the bleeding.  This morning patient was not having any bleeding.  On 8/25 had redeveloped slow ooze bleeding from mouth and trach.  Eliquis discontinued in favor of IV heparin especially given patient will undergo surgical procedure for vascular access on 8/29 8/30 Left FA AVG occluded 9/2 status post thrombectomy and revision of LUE AV graft 9/4 LUE edema with negative venous duplex. Chane/5 heparin discontinued and Eliquis resumed    Subjective: Sleeping soundly.  Awakened briefly during exam.  Did not make any attempts to verbally communicate today.  Objective: Vitals:   09/17/20 0600 09/17/20 0700  BP: 134/70 (!) 146/80  Pulse:  75  Resp: 20 20  Temp:    SpO2: 99% 98%    Intake/Output Summary (Last 24 hours) at 09/17/2020 0732 Last data filed at 09/17/2020 0500 Gross per 24 hour  Intake 1686.99 ml  Output 225 ml  Net 1461.99 ml   Filed Weights   09/15/20 1100 09/16/20 0400 09/17/20 0600  Weight: 77.5 kg 79.1 kg 82.2 kg    Exam:  Constitutional: Sleeping and briefly awaken during physical exam.  No acute distress Respiratory: Trach: 6.0 XLT cuffed, Vent settings: PRVC mode,  Fio2 40%, rate 20, VT 510, PEEP 5; lungs remain coarse with expiratory rhonchi bilaterally.  Expiratory effort per vent (diaphragm paralyzed).   Cardiovascular: Sinus rhythm, normotensive, bilateral extremity edema.  Increased in LUE post vision. Abdomen: LBM 9/05, PEG tube clogged.  Cortrack in place for feedings until back ordered feeding tube available. Left lower quadrant colostomy in place, abdomen soft and nondistended, normoactive bowel sounds. Neurologic: CN 2-12 grossly intact on visual inspection- patient with quadriplegia so unable to independently move extremities.  No sensation below shoulders. Psychiatric: Awake and nods head appropriately to questions asked.     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 Acute sx's secondary to pneumonia/volume overload - redeveloped Acinetobacter pneumonia (8/14) and completed 7 days of Unasyn Ventilator management per PCCM  Recurrent ventilator associated pneumonia 2/2 recurrent Acinetobacter/sepsis without shock WBCs stable with positive sputum culture sensitive only to Unasyn ID/Dr Juleen China recommended 7 days of therapy -completed on 8/22   AKI with oliguria/volume overload/ New CKD requiring HD Anuric Nephrology following, initially required CRRT and has transitioned to HD (due to staffing issues continues on CRRT at bedside) [facility] placed on 7/26-Left FA AVG graft placed but occluded underwent thrombectomy and graft revision on 9/2. AVG mains patent.  Significant LUE edema secondary to recent surgical procedures per VVS-venous duplex neg  9/4   Septic shock 2/2 VAP POA Presented with sepsis physiology and profound shock which has now resolved  Chronic thoracic back pain Continue Oxy and fentanyl prn; Skelaxin and lidocaine patch  History of paroxysmal atrial fibrillation Patient was on amiodarone prior to admission -maintaining SR Eliquis discontinued  2/2 recurrent peritracheal /oral bleeding.   No further bleeding on IV heparin. AVG functional so we will discontinue heparin and resume Eliquis on 9/ TSH was WNL this admission QTc 420 ms on 7/11   Acute metabolic encephalopathy Resolved Secondary to acute illness. CT head negative for acute process.  Back at baseline.   Diabetes mellitus, type 2 Continue Semglee 25 units daily.  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  Resolved.  Quadriplegia secondary to compressive myelopathy.  Previously resided at [facility] but with poor performance scores as well as poor rehabilitation potential [facility] will not accept him back if he is HD dependent (he must be able to sit up in a chair for dialysis.)   Microcytic anemia/Acute anemia superimposed on anemia of chronic kidney disease Patient required 4 units of PRBC to date.  Hemoglobin on 8/26 was 8.5 and as of 9/5 was down to 7.0 with on call PCCM nocturnist ordering 1 unit of packed red blood cells to be infused today Continue Aranesp   Severe protein calorie malnutrition Nutrition Problem: Increased nutrient needs Etiology: wound healing Signs/Symptoms: estimated needs Interventions: Tube feeding, Prostat, MVI Estimated body mass index is 29.25 kg/m as calculated from the following:   Height as of this encounter: _0  (1.676 m).   Weight as of this encounter: 82.2 kg.  PEG tube unable to be declogged successfully.  Feeding tube on backorder.  Continue Cortrak for feedings and meds. Patient with sore throat and throat discomfort secondary to cortrack tube.  Chloraseptic Spray ordered.  Pressure injury/stage IV sacral decubitus Stage IV medial/posterior sacrum present on admission.   Right/posterior/lateral ankle unknown if present on admission.  DTI left ear, not present on admission. (For additional documentation see wound care notes)    Data Reviewed: Basic Metabolic Panel: Recent Labs  Lab 09/11/20 0312 09/13/20 0343 09/15/20 0635  NA 131* 132* 133*  K 3.9 4.0 3.7  CL 95* 97* 98  CO2 20* 25 24  GLUCOSE 169* 117* 200*  BUN 89* 62* 53*  CREATININE 1.57* 1.24 1.22  CALCIUM 9.0 8.8* 9.0  MG 2.4  --   --   PHOS 6.4* 5.2* 5.2*   Liver Function Tests: Recent Labs  Lab 09/11/20 0312 09/13/20 0343 09/15/20 0635  AST 28  --   --   ALT 15  --   --   ALKPHOS 182*  --   --   BILITOT 0.8  --   --   PROT 6.9  --   --   ALBUMIN 1.7* 1.6* 1.6*   No results for input(s): LIPASE, AMYLASE in the last 168 hours. No results for input(s): AMMONIA in the last 168 hours. CBC: Recent Labs  Lab 09/13/20 0343 09/28/2020 0335 09/15/20 0500 09/16/20 0500 09/17/20 0457  WBC 16.1* 17.5* 20.2* 21.7* 20.0*  HGB 7.6* 7.5* 7.3* 7.2* 7.0*  HCT 24.3* 23.9* 22.7* 23.1* 22.1*  MCV 81.5 81.6 81.1 81.9 80.7  PLT 419* 410* 380 442* 381  Cardiac Enzymes: No results for input(s): CKTOTAL, CKMB, CKMBINDEX, TROPONINI in the last 168 hours. BNP (last 3 results) Recent Labs    07/05/2020 1922  BNP 135.0*    ProBNP (last 3 results) No results for input(s): PROBNP in the last 8760 hours.  CBG: Recent Labs  Lab 09/16/20 1122 09/16/20 1553 09/16/20 1952 09/16/20 2333 09/17/20 0335  GLUCAP 166* 137* 147* 139* 122*       Studies: VAS [place] UPPER EXTREMITY VENOUS DUPLEX  Result Date: 09/16/2020 UPPER VENOUS STUDY  Patient Name:  Luis Orr  Date of Exam:   09/16/2020 Medical Rec #: [id number]          Accession #:    [id number] Date of Birth: [birth year]         Patient Gender: M Patient Age:   32 years Exam Location[facility]  [facility] Procedure:      VAS [place] UPPER EXTREMITY VENOUS DUPLEX Referring Phys: Darliss Cheney  --------------------------------------------------------------------------------  Indications: Edema Other Indications: S/P thrombectomy and revision of left forearm loop graft 09/22/2020. Limitations: Bandages and edema, left subclavian central line, trach collar. Comparison Study: No prior study on file Performing Technologist: Sharion Dove RVS  Examination Guidelines: A complete evaluation includes B-mode imaging, spectral Doppler, color Doppler, and power Doppler as needed of all accessible portions of each vessel. Bilateral testing is considered an integral part of a complete examination. Limited examinations for reoccurring indications may be performed as noted.  Right Findings: +----------+------------+---------+-----------+----------+-------+ RIGHT     CompressiblePhasicitySpontaneousPropertiesSummary +----------+------------+---------+-----------+----------+-------+ Subclavian               Yes       Yes                      +----------+------------+---------+-----------+----------+-------+  Left Findings: +----------+------------+---------+-----------+----------+--------------+ LEFT      CompressiblePhasicitySpontaneousProperties   Summary     +----------+------------+---------+-----------+----------+--------------+ IJV                                                 Not visualized +----------+------------+---------+-----------+----------+--------------+ Subclavian               Yes       Yes                             +----------+------------+---------+-----------+----------+--------------+ Axillary                 Yes       Yes                             +----------+------------+---------+-----------+----------+--------------+ Brachial                                                patent     +----------+------------+---------+-----------+----------+--------------+ Radial        Full                                                  +----------+------------+---------+-----------+----------+--------------+ Ulnar  Not visualized +----------+------------+---------+-----------+----------+--------------+ Cephalic      Full                                                 +----------+------------+---------+-----------+----------+--------------+ Basilic                                             Not visualized +----------+------------+---------+-----------+----------+--------------+  Summary:  Right: No evidence of thrombosis in the subclavian.  Left: No obvious evidence of DVT or SVT noted in the visualized veins of the left upper extremity. Graft appears patent.  *See table(s) above for measurements and observations.  Diagnosing physician: Deitra Mayo MD Electronically signed by Deitra Mayo MD on 09/16/2020 at 1:55:22 PM.    Final     Scheduled Meds:  sodium chloride   Intravenous Once   acetaminophen  650 mg Oral Once   amiodarone  200 mg Per Tube Daily   buPROPion  75 mg Per Tube BID   chlorhexidine gluconate (MEDLINE KIT)  15 mL Mouth Rinse BID   Chlorhexidine Gluconate Cloth  6 each Topical Daily   [START ON 09/18/2020] darbepoetin (ARANESP) injection - DIALYSIS  100 mcg Intravenous Q Tue-HD   diphenhydrAMINE  25 mg Oral Once   feeding supplement (PROSource TF)  45 mL Per Tube TID   glycopyrrolate  1 mg Per Tube BID   guaiFENesin  5 mL Per Tube BID   insulin aspart  0-9 Units Subcutaneous Q4H   insulin glargine-yfgn  30 Units Subcutaneous Daily   lidocaine  1 patch Transdermal Q24H   mouth rinse  15 mL Mouth Rinse 10 times per day   metaxalone  800 mg Per Tube TID   midodrine  5 mg Per Tube TID   multivitamin  1 tablet Per Tube QHS   mupirocin ointment  1 application Nasal BID   pantoprazole sodium  40 mg Per Tube QHS   scopolamine  1 patch Transdermal Q72H   sertraline  100 mg Per Tube Daily   sodium bicarbonate  650 mg Per Tube Once   sodium  chloride flush  10-40 mL Intracatheter Q12H   Continuous Infusions:  sodium chloride 10 mL/hr at 09/27/2020 1429   sodium chloride 500 mL (09/01/20 1408)   sodium chloride     sodium chloride     sodium chloride     feeding supplement (NEPRO CARB STEADY) 1,000 mL (09/16/20 1630)   heparin 2,250 Units/hr (09/17/20 0500)    Active Problems:   Ventilator dependent (HCC)   Septic shock (HCC)   Pressure injury of skin   Acute respiratory failure with hypoxia (HCC)   AKI (acute kidney injury) ([place])   Hypotension   Goals of care, counseling/discussion   Quadriplegia ([place])   Chronic kidney disease requiring chronic dialysis ([place])   Hyperglycemia   Sepsis due to Acinetobacter baumannii ([place])   Pneumonia due to Acinetobacter species ([place])   Dysautonomia ([place])   Chronic midline thoracic back pain   Consultants: PCCM Nephrology Infectious disease Palliative care medicine  Procedures: Echocardiogram Insertion of double-lumen HD catheter  Antibiotics: Cefepime x1 dose Meropenem 6/29 through 7/2 Vancomycin 6/29 through 6/30 Unasyn 7/2 through 7/15 Unasyn 8/15 >8/22   Time spent: 35 minutes    Erin Hearing  ANP  [facility] 7 am - 330 pm/M-F for direct patient care and secure chat Please refer to Amion for contact info 68  days

## 2020-09-18 ENCOUNTER — Encounter (HOSPITAL_COMMUNITY): Payer: Self-pay | Admitting: Surgery

## 2020-09-18 ENCOUNTER — Inpatient Hospital Stay (HOSPITAL_COMMUNITY): Payer: 59

## 2020-09-18 DIAGNOSIS — J158 Pneumonia due to other specified bacteria: Secondary | ICD-10-CM | POA: Diagnosis not present

## 2020-09-18 DIAGNOSIS — A4159 Other Gram-negative sepsis: Secondary | ICD-10-CM | POA: Diagnosis not present

## 2020-09-18 DIAGNOSIS — A419 Sepsis, unspecified organism: Secondary | ICD-10-CM | POA: Diagnosis not present

## 2020-09-18 DIAGNOSIS — Z9911 Dependence on respirator [ventilator] status: Secondary | ICD-10-CM | POA: Diagnosis not present

## 2020-09-18 DIAGNOSIS — R140 Abdominal distension (gaseous): Secondary | ICD-10-CM

## 2020-09-18 LAB — TYPE AND SCREEN
ABO/RH(D): O POS
Antibody Screen: NEGATIVE
Unit division: 0

## 2020-09-18 LAB — CBC
HCT: 23.8 % — ABNORMAL LOW (ref 39.0–52.0)
Hemoglobin: 7.6 g/dL — ABNORMAL LOW (ref 13.0–17.0)
MCH: 25.9 pg — ABNORMAL LOW (ref 26.0–34.0)
MCHC: 31.9 g/dL (ref 30.0–36.0)
MCV: 81 fL (ref 80.0–100.0)
Platelets: 361 10*3/uL (ref 150–400)
RBC: 2.94 MIL/uL — ABNORMAL LOW (ref 4.22–5.81)
RDW: 19.3 % — ABNORMAL HIGH (ref 11.5–15.5)
WBC: 22.5 10*3/uL — ABNORMAL HIGH (ref 4.0–10.5)
nRBC: 0.6 % — ABNORMAL HIGH (ref 0.0–0.2)

## 2020-09-18 LAB — GLUCOSE, CAPILLARY
Glucose-Capillary: 116 mg/dL — ABNORMAL HIGH (ref 70–99)
Glucose-Capillary: 126 mg/dL — ABNORMAL HIGH (ref 70–99)
Glucose-Capillary: 129 mg/dL — ABNORMAL HIGH (ref 70–99)
Glucose-Capillary: 138 mg/dL — ABNORMAL HIGH (ref 70–99)
Glucose-Capillary: 160 mg/dL — ABNORMAL HIGH (ref 70–99)
Glucose-Capillary: 165 mg/dL — ABNORMAL HIGH (ref 70–99)

## 2020-09-18 LAB — RENAL FUNCTION PANEL
Albumin: 1.8 g/dL — ABNORMAL LOW (ref 3.5–5.0)
Anion gap: 11 (ref 5–15)
BUN: 66 mg/dL — ABNORMAL HIGH (ref 8–23)
CO2: 24 mmol/L (ref 22–32)
Calcium: 9.5 mg/dL (ref 8.9–10.3)
Chloride: 95 mmol/L — ABNORMAL LOW (ref 98–111)
Creatinine, Ser: 1.16 mg/dL (ref 0.61–1.24)
GFR, Estimated: >60 mL/min (ref >60)
Glucose, Bld: 119 mg/dL — ABNORMAL HIGH (ref 70–99)
Phosphorus: 3.8 mg/dL (ref 2.5–4.6)
Potassium: 3.3 mmol/L — ABNORMAL LOW (ref 3.5–5.1)
Sodium: 130 mmol/L — ABNORMAL LOW (ref 135–145)

## 2020-09-18 LAB — BPAM RBC
Blood Product Expiration Date: 202209302359
ISSUE DATE / TIME: 202209050816
Unit Type and Rh: 5100

## 2020-09-18 LAB — LACTIC ACID, PLASMA: Lactic Acid, Venous: 1 mmol/L (ref 0.5–1.9)

## 2020-09-18 MED ORDER — SODIUM CHLORIDE 0.9 % IV SOLN: 100.0000 mL | INTRAVENOUS | Status: DC | PRN

## 2020-09-18 MED ORDER — LIDOCAINE HCL (PF) 1 % IJ SOLN: 5.0000 mL | INTRAMUSCULAR | Status: DC | PRN

## 2020-09-18 MED ORDER — ALTEPLASE 2 MG IJ SOLR: 2.0000 mg | Freq: Once | INTRAMUSCULAR | Status: DC | PRN

## 2020-09-18 MED ORDER — LIDOCAINE-PRILOCAINE 2.5-2.5 % EX CREA: 1.0000 "application " | TOPICAL_CREAM | CUTANEOUS | Status: DC | PRN

## 2020-09-18 MED ORDER — OXYCODONE HCL 5 MG/5ML PO SOLN
5.0000 mg | ORAL | Status: DC | PRN
  Administered 2020-09-19 – 2020-10-03 (×10): 5 mg
  Filled 2020-09-18 (×10): qty 5

## 2020-09-18 MED ORDER — HEPARIN SODIUM (PORCINE) 1000 UNIT/ML DIALYSIS
1000.0000 [IU] | INTRAMUSCULAR | Status: DC | PRN
  Administered 2020-09-20: 3800 [IU] via INTRAVENOUS_CENTRAL
  Filled 2020-09-18: qty 1

## 2020-09-18 MED ORDER — PENTAFLUOROPROP-TETRAFLUOROETH EX AERO: 1.0000 "application " | INHALATION_SPRAY | CUTANEOUS | Status: DC | PRN

## 2020-09-18 NOTE — Progress Notes (Signed)
NAME:  Luis Orr, MRN:  [id number], DOB:  1950-05-12, LOS: 56 ADMISSION DATE:  07/08/2020, CONSULTATION DATE:  06/15/2020 REFERRING MD:  Dr Francia Greaves, EDP CHIEF COMPLAINT:  septic shock    History of Present Illness:  70 yo male from [facility] with hx of C3-C5 compressive myelopathy with functional quadriplegia and chronic trach/vent presented to ED with altered mental status and hypotension with recurrent HCAP.  Has multiple recent admissions for sepsis secondary to various infections including UTIs, pneumonias and bacteremia in setting of MDR Klebsiella, MRSA and pseudomonas. Most recent admission September 2021 in setting of sepsis due to MDR Klebsiella with right ureteral calculus s/p double J ureteral stent placement and treated with meropenem x 10days.  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 Central line placed. Drainage from gastrostomy tube >> CT Abd without fluid collection around gastrostomy tube but with diffuse body wall edema. Echo EF 50-55%, mild LVH  7/6 Renal Replacement Therapy ; Code status changed to partial Code (DNR) 7/7 Transfuse 1U PRBC 7/9 add solu cortef 7/10 off pressors, weaning stress steroids  7/11 Pressors added back on again 7/12 ID consult due to rising WBC, hypothermia, hypotension concern for worsening infection. Vancomycin added. Repeat blood cx collected. 7/15 CRRT stopped   7/17 Restarted on low dose levo. No evidence of new infection. Held off on antibiotics. Nephrology contacted family and decision was made to continue iHD this week to allow additional time for renal recovery. Patient is not a long term iHD candidate  7/18 plans for iHD, tolerated with 2L removed  7/19 slight drop in hgb to 6.9 will transfuse 1 unit PRBC  7/20 To [facility], PCCM following for trach / vent needs 7/22>> tolerating HD, will need out of state placement 8/4 last PCCM visit. 8/4 -8/8: No issues. CM workin gon out of state placement  8/15 no acute changes. Continues on PRVC, was apneic with PSV attempt. Did wean to 40% FiO2  8/29 Oral bleeding from biting lip/tongue, subsequent bloody trach secretions   Interim History / Subjective:  Afebrile, WBC 22.5  Vent needs - PEEP 5, 40% FiO2  RT reports trach change due mid September  Objective   Blood pressure 115/70, pulse 79, temperature 98.5 F (36.9 C), temperature source Oral, resp. rate 20, height '5\' 6"'$  (1.676 m), weight 81.8 kg, SpO2 99 %.    Vent Mode: PRVC FiO2 (%):  [40 %] 40 % Set Rate:  [20 bmp] 20 bmp Vt Set:  [510 mL] 510 mL PEEP:  [5 cmH20] 5 cmH20 Plateau Pressure:  [19 cmH20-33 cmH20] 19 cmH20   Intake/Output Summary (Last 24 hours) at 09/18/2020 0728 Last data filed at 09/18/2020 0600 Gross per 24 hour  Intake 1842.1 ml  Output 150 ml  Net 1692.1 ml   Filed Weights   09/16/20 0400 09/17/20 0600 09/18/20 0314  Weight: 79.1 kg 82.2 kg 81.8 kg    Physical Exam: General: chronically ill appearing adult  male lying in bed on vent in NAD HEENT: MM pink/moist, trach midline c/d/i, pupils =/reactive   Neuro: Awake, alert, nods yes/no to questions CV: s1s2 RRR, no m/r/g, SR on monitor PULM: non-labored on vent, lungs bilaterally with coarse rhonchi GI: soft, bsx4 active  Extremities: warm/dry, no edema, space boots intact  Skin: no rashes or lesions.  R tunneled HD cath, L tunneled PICC    Resolved Hospital Problem  list   Hyperkalemia Tachycardia Hypotension Thrombocytosis  AKI Septic shock 2nd to HCAP and UTI all present on admission -Acinetobacter in sputum culture from 07/12/20. Completed 14 days of Unasyn for -Of pressors on 4/14. Restarted  levo 7/17. -acute on chronic respiratory failure   Assessment & Plan:   C3-C5 compressive Myelopathy w/ resultant Chronic Respiratory failure, Vent and Trach dependence  Acinetobacter Baumannii PNA Not weaning.  -PRVC 8cc/kg, not able to wean  -trach care per protocol, anticipate next trach change mid September (Q month) -VAP prevention measures  -DNR in the event of cardiac arrest  -upright positioning  -follow intermittent CXR  -minimize sedating medications  -caution with scopolamine / drying agents as may make it difficult to clear secretions   PCCM will see twice weekly   Noe Gens, MSN, APRN, NP-C, AGACNP-BC [facility] 09/18/2020, 7:28 AM   Please see [email or web address] for pager details.   From 7A-7P if no response, please call [phone] After hours, please call ELink [phone]

## 2020-09-18 NOTE — Progress Notes (Signed)
RT NOTE: Tracheostomy change due on RT work list. RT asked Theadora Rama, NP about trach change. Per NP we will continue to change trach once a month instead of every 2 weeks. RT will continue to monitor.

## 2020-09-18 NOTE — Progress Notes (Addendum)
Out Patient Arrangements:  Have been requested to arrange out pt HD for pt. Spoke w/ Mady Gemma, w/ [facility]. He is going to [facility] in [place], [place]. He needs to have [place] Medicaid & his AVG needs to be mature prior to being accepted. Other possible barrier is pt is a permanent trach & vent pt.   Linus Orn HPSS [phone]

## 2020-09-18 NOTE — Progress Notes (Signed)
[facility] PROGRESS NOTE  Luis Orr GNF:[id number] DOB: 1950-07-04 DOA: 07/12/2020 PCP: Townsend Roger, MD  Status: Remains inpatient appropriate because:Hemodynamically unstable, Persistent severe electrolyte disturbances, Unsafe d/c plan, and Inpatient level of care appropriate due to severity of illness  Dispo: The patient is from: [facility] vent capable              Anticipated d/c is to: SNF Vent capable w/ access to in facility/stretcher based HD-a facility in [place] has been located in preauthorization in progress.              Patient currently is not medically stable to d/c.   Difficult to place patient Yes    Level of care: ICU  Code Status: Partial (no CPR and no defibrillation or cardioversion) Family Communication: [name] on 8/22.  Of note the wife is also the POA DVT prophylaxis: IV Heparin COVID vaccination status: Unknown    HPI:  70 y.o. 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 Patient presented secondary to altered mental status and hypotension with recurrent pneumonia requiring vasopressor support and ICU admission.he has had multiple recent admissions for sepsis secondary to various infections including UTIs, pneumonias and bacteremia in setting of MDR Klebsiella, MRSA and pseudomonas. Most recent admission September 2021 in setting of sepsis due to MDR Klebsiella with right ureteral calculus s/p double J ureteral stent placement and treated with meropenem x 10days. He was discharged back to [facility].  ICU significant events: 6/29 admitted to ICU for septic shock on levo to maintain MAP>65, continued on vent support 6/29 Blood Cx>> staph species in 1 of 4 cultures drawn>> suspect contaminant 6/30 Off pressors 7/2 ID Consult for MDR acinetobacter in trach asp, Meropenem changed to unasyn 7/2 PRBC transfusion, iron replacement 7/5 Pressors added again. Central line placed.  Drainage from gastrostomy tube >> CT Abd without fluid collection around gastrostomy tube but with diffuse body wall edema. Echo EF 50-55%, mild LVH 7/6 Renal Replacement Therapy ; Code status changed to partial Code (DNR) 7/7 Transfuse 1U PRBC 7/9 add solu cortef 7/10 off pressors, weaning stress steroids 7/11 Pressors added back on again 7/12 ID consult due to rising WBC, hypothermia, hypotension concern for worsening infection. Vancomycin added. Repeat blood cx collected. 7/15 CRRT stopped 7/17 Restarted on low dose levo. No evidence of new infection. Held off on antibiotics. Nephrology contacted family and decision was made to continue iHD this week to allow additional time for renal recovery. Patient is not the best long term iHD candidate but family wants aggressive care 7/18 plans for iHD, tolerated with 2L removed 719 slight drop in hgb to 6.9 will transfuse 1 unit PRBC 7/22 > 7/26: Tolerating HD. TOC consulted for placement. Will likely need out of state placement. 8/15 WBCs greater than 28,000, hypoglycemia, procalcitonin >18, chest x-ray consistent with pneumonia, recent blood cultures consistent with contamination.  Unasyn IV resumed after sputum culture obtained 8/16 consultation placed with ethics committee regarding evaluation for futility of care 8/16 Sputum cx + for Acinetobacter with sensitivities pending 8/17 extensive conversation held with patient's wife regarding patient's poor prognosis and reemergence of Acinetobacter VAP.  Also discussed with her that patient has been able to convey to multiple staff that he does not want to continue on the ventilator or with dialysis.  Wife reports that the patient does not understand what he is saying, she is the POA and that she wants to continue aggressive care. 8/18 patient  now telling nephrology, myself and the rest of the staff he replies yes to wanting to remain on dialysis and on the ventilator and this direct question is  asked. 8/18 Acinetobacter sensitive to Unasyn only.  Discussed with ID/Dr. Juleen China.  Plan is to administer antibiotics for 7-day since patient has not had any increased O2 needs and remains afebrile.  If any concerns over worsening symptoms can give an additional 3 days. Developed moderate bleeding around tracheostomy site on 8/19.  Likely related to combination of Eliquis and heparin required for CRRT.  We will hold Eliquis for now and monitor degree of bleeding Eliquis resumed on 8/22.  Overnight into 8/23 patient bit his tongue and had bleeding.  Also had some bleeding around trach likely from oral bleeding.  Also though did have bleeding from old central line site. 8/23 had transient bleeding around trach and prior central line insertion site overnight.  Patient had bitten tongue and this likely explain the bleeding.  This morning patient was not having any bleeding.  On 8/25 had redeveloped slow ooze bleeding from mouth and trach.  Eliquis discontinued in favor of IV heparin especially given patient will undergo surgical procedure for vascular access on 8/29 8/30 Left FA AVG occluded 9/2 status post thrombectomy and revision of LUE AV graft 9/4 LUE edema with negative venous duplex. 9/5 heparin discontinued and Eliquis resumed    Subjective: Vague.  Able to convey that he still has sore throat and back pain but that his left upper extremity operative site is causing him the most discomfort.  He also relates abdominal discomfort.  Objective: Vitals:   09/18/20 0600 09/18/20 0714  BP: 115/70   Pulse: 79   Resp: 20   Temp:  98.5 F (36.9 C)  SpO2: 99%     Intake/Output Summary (Last 24 hours) at 09/18/2020 0729 Last data filed at 09/18/2020 0600 Gross per 24 hour  Intake 1842.1 ml  Output 150 ml  Net 1692.1 ml   Filed Weights   09/16/20 0400 09/17/20 0600 09/18/20 0314  Weight: 79.1 kg 82.2 kg 81.8 kg    Exam:  Constitutional: Awake, appears quite uncomfortable. Respiratory:  Trach: 6.0 XLT cuffed, Vent settings: PRVC mode, Fio2 40%, rate 20, VT 510, PEEP 5; lungs remain coarse with expiratory rhonchi bilaterally.  No secretions at trach including no blood-tinged drainage.  Expiratory effort per vent (diaphragm paralyzed).   Cardiovascular: NSR, normotensive, no bilateral lower extremity edema.  Stable LUE edema.  Erythema directly over AVG site with palpable thrill Abdomen: LBM 9/05, PEG tube clogged.  Cortrack in place for feedings until back ordered feeding tube available. Left lower quadrant colostomy in place, abdomen distended and mildly tympanitic.  Noted excessive gas collected in colostomy bag with low volume liquid stool. Neurologic: CN 2-12 grossly intact on visual inspection- patient with quadriplegia so unable to independently move extremities.  No sensation below shoulders. Psychiatric: Awake and nods head appropriately to questions asked.     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 Suspect  related to recent surgical procedures and inflammation/edema of LUE Now has abdominal distention with hyperactive bowel sounds in center stool output although very low volume.  Continue to follow since patient could be developing C. difficile colitis-currently does not meet criteria set forth by the hospital and ID for checking C. Difficile Abdominal x-ray unremarkable with normal bowel gas  pattern. Check fecal lactoferrin and follow CBC daily  Of note he is not on any scheduled laxative agents   AKI with oliguria/volume overload/ New CKD requiring HD Anuric Nephrology following, initially required CRRT and has transitioned to HD (due to staffing issues continues on CRRT at bedside) [facility] placed on 7/26-Left FA AVG graft placed but occluded underwent thrombectomy and graft revision on 9/2. AVG mains patent.  Significant LUE edema secondary to recent surgical procedures per [facility]VS-venous duplex neg 9/4   Septic shock 2/2 VAP POA Presented with sepsis physiology and profound shock which has now resolved  Chronic thoracic back pain Continue Oxy and fentanyl prn; Skelaxin and lidocaine patch  History of paroxysmal atrial fibrillation Patient was on amiodarone prior to admission -maintaining SR AVG functional heparin discontinued on 9 5 and Eliquis resumed  TSH was WNL this admission QTc 420 ms on 8/09   Acute metabolic encephalopathy Resolved Secondary to acute illness. CT head negative for acute process.  Back at baseline.   Diabetes mellitus, type 2 Continue Semglee 25 units daily.  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  Resolved.  Quadriplegia secondary to compressive myelopathy.  Previously resided at [facility] but with poor performance scores as well as poor rehabilitation potential [facility] will not accept him back if he is HD dependent (he must be able to sit up in a chair for dialysis.)   Microcytic anemia/Acute anemia superimposed on anemia of chronic kidney disease Patient required 4 units of PRBC to date.  Hemoglobin on 8/26 was 8.5 and as of 9/5 was down to 7.0 with on call PCCM nocturnist ordering 1 unit of  packed red blood cells to be infused today Continue Aranesp   Severe protein calorie malnutrition Nutrition Problem: Increased nutrient needs Etiology: wound healing Signs/Symptoms: estimated needs Interventions: Tube feeding, Prostat, MVI Estimated body mass index is 29.11 kg/m as calculated from the following:   Height as of this encounter: $RemoveBeforeD'5\' 6"'[id number]$  (1.676 m).   Weight as of this encounter: 81.8 kg.  PEG tube unable to be declogged successfully.  Feeding tube on backorder.  Continue Cortrak for feedings and meds. Patient with sore throat and throat discomfort secondary to cortrack tube.  Chloraseptic Spray ordered.  Pressure injury/stage IV sacral decubitus Stage IV medial/posterior sacrum present on admission.  Right/posterior/lateral ankle unknown if present on admission.  DTI left ear, not present on admission. (For additional documentation see wound care notes)    Data Reviewed: Basic Metabolic Panel: Recent Labs  Lab 09/13/20 0343 09/15/20 0635  NA 132* 133*  K 4.0 3.7  CL 97* 98  CO2 25 24  GLUCOSE 117* 200*  BUN 62* 53*  CREATININE 1.24 1.22  CALCIUM 8.8* 9.0  PHOS 5.2* 5.2*   Liver Function Tests: Recent Labs  Lab 09/13/20 0343 09/15/20 0635  ALBUMIN 1.6* 1.6*   No results for input(s): LIPASE, AMYLASE in the last 168 hours. No results for input(s):  AMMONIA in the last 168 hours. CBC: Recent Labs  Lab 10/07/2020 0335 09/15/20 0500 09/16/20 0500 09/17/20 0457 09/18/20 0328  WBC 17.5* 20.2* 21.7* 20.0* 22.5*  HGB 7.5* 7.3* 7.2* 7.0* 7.6*  HCT 23.9* 22.7* 23.1* 22.1* 23.8*  MCV 81.6 81.1 81.9 80.7 81.0  PLT 410* 380 442* 381 361   Cardiac Enzymes: No results for input(s): CKTOTAL, CKMB, CKMBINDEX, TROPONINI in the last 168 hours. BNP (last 3 results) Recent Labs    06/21/2020 1922  BNP 135.0*    ProBNP (last 3 results) No results for input(s): PROBNP in the last 8760 hours.  CBG: Recent Labs  Lab 09/17/20 1611 09/17/20 1953 09/17/20 2344  09/18/20 0311 09/18/20 0712  GLUCAP 139* 137* 154* 138* 116*       Studies: VAS [place] UPPER EXTREMITY VENOUS DUPLEX  Result Date: 09/16/2020 UPPER VENOUS STUDY  Patient Name:  CURRIE DENNIN  Date of Exam:   09/16/2020 Medical Rec #: [id number]          Accession #:    [id number] Date of Birth: 08/06/1950         Patient Gender: M Patient Age:   74 years Exam Location:  [facility] Procedure:      VAS [place] UPPER EXTREMITY VENOUS DUPLEX Referring Phys: Darliss Cheney --------------------------------------------------------------------------------  Indications: Edema Other Indications: S/P thrombectomy and revision of left forearm loop graft 10/09/2020. Limitations: Bandages and edema, left subclavian central line, trach collar. Comparison Study: No prior study on file Performing Technologist: Sharion Dove RVS  Examination Guidelines: A complete evaluation includes B-mode imaging, spectral Doppler, color Doppler, and power Doppler as needed of all accessible portions of each vessel. Bilateral testing is considered an integral part of a complete examination. Limited examinations for reoccurring indications may be performed as noted.  Right Findings: +----------+------------+---------+-----------+----------+-------+ RIGHT     CompressiblePhasicitySpontaneousPropertiesSummary +----------+------------+---------+-----------+----------+-------+ Subclavian               Yes       Yes                      +----------+------------+---------+-----------+----------+-------+  Left Findings: +----------+------------+---------+-----------+----------+--------------+ LEFT      CompressiblePhasicitySpontaneousProperties   Summary     +----------+------------+---------+-----------+----------+--------------+ IJV                                                 Not visualized +----------+------------+---------+-----------+----------+--------------+ Subclavian               Yes       Yes                              +----------+------------+---------+-----------+----------+--------------+ Axillary                 Yes       Yes                             +----------+------------+---------+-----------+----------+--------------+ Brachial                                                patent     +----------+------------+---------+-----------+----------+--------------+ Radial  Full                                                 +----------+------------+---------+-----------+----------+--------------+ Ulnar                                               Not visualized +----------+------------+---------+-----------+----------+--------------+ Cephalic      Full                                                 +----------+------------+---------+-----------+----------+--------------+ Basilic                                             Not visualized +----------+------------+---------+-----------+----------+--------------+  Summary:  Right: No evidence of thrombosis in the subclavian.  Left: No obvious evidence of DVT or SVT noted in the visualized veins of the left upper extremity. Graft appears patent.  *See table(s) above for measurements and observations.  Diagnosing physician: Deitra Mayo MD Electronically signed by Deitra Mayo MD on 09/16/2020 at 1:55:22 PM.    Final     Scheduled Meds:  amiodarone  200 mg Per Tube Daily   apixaban  5 mg Per Tube BID   buPROPion  75 mg Per Tube BID   chlorhexidine gluconate (MEDLINE KIT)  15 mL Mouth Rinse BID   Chlorhexidine Gluconate Cloth  6 each Topical Daily   Chlorhexidine Gluconate Cloth  6 each Topical Q0600   darbepoetin (ARANESP) injection - DIALYSIS  100 mcg Intravenous Q Tue-HD   diphenhydrAMINE  25 mg Oral Once   feeding supplement (PROSource TF)  45 mL Per Tube TID   glycopyrrolate  1 mg Per Tube BID   guaiFENesin  5 mL Per Tube BID   insulin aspart  0-9 Units Subcutaneous Q4H   insulin  glargine-yfgn  30 Units Subcutaneous Daily   lidocaine  1 patch Transdermal Q24H   mouth rinse  15 mL Mouth Rinse 10 times per day   metaxalone  800 mg Per Tube TID   midodrine  5 mg Per Tube TID   multivitamin  1 tablet Per Tube QHS   mupirocin ointment  1 application Nasal BID   pantoprazole sodium  40 mg Per Tube QHS   scopolamine  1 patch Transdermal Q72H   sertraline  100 mg Per Tube Daily   simethicone  40 mg Per Tube QID   sodium bicarbonate  650 mg Per Tube Once   sodium chloride flush  10-40 mL Intracatheter Q12H   Continuous Infusions:  sodium chloride 10 mL/hr at 09/15/2020 1429   sodium chloride 500 mL (09/01/20 1408)   sodium chloride     sodium chloride     sodium chloride     feeding supplement (NEPRO CARB STEADY) 1,000 mL (09/17/20 1645)    Active Problems:   Ventilator dependent (HCC)   Septic shock (HCC)   Pressure injury of skin   Acute respiratory failure with hypoxia (HCC)   AKI (acute kidney injury) ([place])  Hypotension   Goals of care, counseling/discussion   Quadriplegia ([place])   Chronic kidney disease requiring chronic dialysis ([place])   Hyperglycemia   Sepsis due to Acinetobacter baumannii ([place])   Pneumonia due to Acinetobacter species ([place])   Dysautonomia ([place])   Chronic midline thoracic back pain   Consultants: PCCM Nephrology Infectious disease Palliative care medicine  Procedures: Echocardiogram Insertion of double-lumen HD catheter  Antibiotics: Cefepime x1 dose Meropenem 6/29 through 7/2 Vancomycin 6/29 through 6/30 Unasyn 7/2 through 7/15 Unasyn 8/15 >8/22   Time spent: 35 minutes    Erin Hearing ANP  [facility] 7 am - 330 pm/M-F for direct patient care and secure chat Please refer to Amion for contact info 69  days

## 2020-09-18 NOTE — Progress Notes (Signed)
[facility] NEPHROLOGY PROGRESS NOTE  Assessment/ Plan:  # Acute kidney Injury-prolonged dialysis dependency, now progressed to ESRD:  ATN from septic shock-without any evidence of renal recovery to date . initiated on CRRT 07/18/20 and transitioned to IHD.  Has TDC, continue with HD on TTS schedule for now. HD today.  # Vascular access - had left forearm AVG placed 09/08/2020 but unfortunately had clotted.  s/p thrombectomy and revision 9/2, graft can be used 4 weeks thereafter.  # Septic shock/ventilator associated pneumonia due to recurrent Acinetobacter infection: Off antibiotics and stable hemodynamics.  #  Chronic respiratory failure: Status post tracheostomy with ventilator management per CCM.  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  # Chronic kidney disease/metabolic bone disease: Calcium and phosphorus level under control with hemodialysis, off binders  # Anemia: contiue ESA, monitor Hb.  Transfuse as needed.  # Acute metabolic encephalopathy: Continue management as above.,  Supportive care.  # Disposition - pt cannot return to [facility] now that he is ESRD, out of state SNF will accept which has now been done.  May need to discuss goals of care again with family/guardian/HCPOA if he starts having issues with IHD again  Subjective: Seen and examined in ICU.  Remains anuric.  No new event.  Objective Vital signs in last 24 hours: Vitals:   09/18/20 0500 09/18/20 0600 09/18/20 0714 09/18/20 0757  BP: 121/73 115/70  125/73  Pulse: 80 79  80  Resp: 20 20  20   Temp:   98.5 F (36.9 C)   TempSrc:   Oral   SpO2: 99% 99%  99%  Weight:      Height:       Weight change: -0.4 kg  Intake/Output Summary (Last 24 hours) at 09/18/2020 0800 Last data filed at 09/18/2020 0600 Gross per 24 hour  Intake 1459.62 ml  Output 150 ml  Net 1309.62 ml        Labs: Basic Metabolic Panel: Recent Labs  Lab 09/13/20 0343  09/15/20 0635  NA 132* 133*  K 4.0 3.7  CL 97* 98  CO2 25 24  GLUCOSE 117* 200*  BUN 62* 53*  CREATININE 1.24 1.22  CALCIUM 8.8* 9.0  PHOS 5.2* 5.2*    Liver Function Tests: Recent Labs  Lab 09/13/20 0343 09/15/20 0635  ALBUMIN 1.6* 1.6*    No results for input(s): LIPASE, AMYLASE in the last 168 hours. No results for input(s): AMMONIA in the last 168 hours. CBC: Recent Labs  Lab 09/20/2020 0335 09/15/20 0500 09/16/20 0500 09/17/20 0457 09/18/20 0328  WBC 17.5* 20.2* 21.7* 20.0* 22.5*  HGB 7.5* 7.3* 7.2* 7.0* 7.6*  HCT 23.9* 22.7* 23.1* 22.1* 23.8*  MCV 81.6 81.1 81.9 80.7 81.0  PLT 410* 380 442* 381 361    Cardiac Enzymes: No results for input(s): CKTOTAL, CKMB, CKMBINDEX, TROPONINI in the last 168 hours. CBG: Recent Labs  Lab 09/17/20 1611 09/17/20 1953 09/17/20 2344 09/18/20 0311 09/18/20 0712  GLUCAP 139* 137* 154* 138* 116*     Iron Studies:  Recent Labs    09/16/20 0500  IRON 27*  TIBC 228*  FERRITIN 282    Studies/Results: VAS [place] UPPER EXTREMITY VENOUS DUPLEX  Result Date: 09/16/2020 UPPER VENOUS STUDY  Patient Name:  Luis Orr  Date of Exam:   09/16/2020 Medical Rec #: [id number]          Accession #:    [id number] Date of Birth: [birth year]  Patient Gender: M Patient Age:   70 years Exam Location:  [facility] Procedure:      VAS [place] UPPER EXTREMITY VENOUS DUPLEX Referring Phys: RAVI PAHWANI --------------------------------------------------------------------------------  Indications: Edema Other Indications: S/P thrombectomy and revision of left forearm loop graft 10/07/2020. Limitations: Bandages and edema, left subclavian central line, trach collar. Comparison Study: No prior study on file Performing Technologist: Sharion Dove RVS  Examination Guidelines: A complete evaluation includes B-mode imaging, spectral Doppler, color Doppler, and power Doppler as needed of all accessible portions of each vessel. Bilateral testing is  considered an integral part of a complete examination. Limited examinations for reoccurring indications may be performed as noted.  Right Findings: +----------+------------+---------+-----------+----------+-------+ RIGHT     CompressiblePhasicitySpontaneousPropertiesSummary +----------+------------+---------+-----------+----------+-------+ Subclavian               Yes       Yes                      +----------+------------+---------+-----------+----------+-------+  Left Findings: +----------+------------+---------+-----------+----------+--------------+ LEFT      CompressiblePhasicitySpontaneousProperties   Summary     +----------+------------+---------+-----------+----------+--------------+ IJV                                                 Not visualized +----------+------------+---------+-----------+----------+--------------+ Subclavian               Yes       Yes                             +----------+------------+---------+-----------+----------+--------------+ Axillary                 Yes       Yes                             +----------+------------+---------+-----------+----------+--------------+ Brachial                                                patent     +----------+------------+---------+-----------+----------+--------------+ Radial        Full                                                 +----------+------------+---------+-----------+----------+--------------+ Ulnar                                               Not visualized +----------+------------+---------+-----------+----------+--------------+ Cephalic      Full                                                 +----------+------------+---------+-----------+----------+--------------+ Basilic  Not visualized +----------+------------+---------+-----------+----------+--------------+  Summary:  Right: No evidence of thrombosis  in the subclavian.  Left: No obvious evidence of DVT or SVT noted in the visualized veins of the left upper extremity. Graft appears patent.  *See table(s) above for measurements and observations.  Diagnosing physician: Deitra Mayo MD Electronically signed by Deitra Mayo MD on 09/16/2020 at 1:55:22 PM.    Final     Medications: Infusions:  sodium chloride 10 mL/hr at 09/18/2020 1429   sodium chloride 500 mL (09/01/20 1408)   sodium chloride     sodium chloride     feeding supplement (NEPRO CARB STEADY) 1,000 mL (09/17/20 1645)    Scheduled Medications:  amiodarone  200 mg Per Tube Daily   apixaban  5 mg Per Tube BID   buPROPion  75 mg Per Tube BID   chlorhexidine gluconate (MEDLINE KIT)  15 mL Mouth Rinse BID   Chlorhexidine Gluconate Cloth  6 each Topical Daily   Chlorhexidine Gluconate Cloth  6 each Topical Q0600   darbepoetin (ARANESP) injection - DIALYSIS  100 mcg Intravenous Q Tue-HD   feeding supplement (PROSource TF)  45 mL Per Tube TID   glycopyrrolate  1 mg Per Tube BID   guaiFENesin  5 mL Per Tube BID   insulin aspart  0-9 Units Subcutaneous Q4H   insulin glargine-yfgn  30 Units Subcutaneous Daily   lidocaine  1 patch Transdermal Q24H   mouth rinse  15 mL Mouth Rinse 10 times per day   metaxalone  800 mg Per Tube TID   midodrine  5 mg Per Tube TID   multivitamin  1 tablet Per Tube QHS   mupirocin ointment  1 application Nasal BID   pantoprazole sodium  40 mg Per Tube QHS   scopolamine  1 patch Transdermal Q72H   sertraline  100 mg Per Tube Daily   simethicone  40 mg Per Tube QID   sodium bicarbonate  650 mg Per Tube Once   sodium chloride flush  10-40 mL Intracatheter Q12H    have reviewed scheduled and prn medications.  Physical Exam: Unchanged from yesterday. General:NAD, able to open eyes, on tracheostomy and vent. Heart:RRR, s1s2 nl Lungs: Coarse breath sound bilateral, no wheezing Abdomen:soft, Non-tender, PEG tube and ostomy  bag Extremities:Trace edema Dialysis Access: LIJ TDC, LUE AVG T/B+  Pete Schnitzer [name] 09/18/2020,8:00 AM  LOS: 69 days

## 2020-09-18 NOTE — Progress Notes (Signed)
Hemodialysis- Treatment interrupted due to low water flow from sink. Facilities called and adjusted pedal flow however continues to have insufficient flow to RO. Machine with fluctuating conductivities and now valve stuck closed due to insufficient water flow. Treatment was discontinued. Awaiting facilities. 2:08 remaining but due to rinseback/restart will loose progress with uf removal.

## 2020-09-19 ENCOUNTER — Inpatient Hospital Stay (HOSPITAL_COMMUNITY): Payer: 59

## 2020-09-19 DIAGNOSIS — Z9911 Dependence on respirator [ventilator] status: Secondary | ICD-10-CM | POA: Diagnosis not present

## 2020-09-19 DIAGNOSIS — A419 Sepsis, unspecified organism: Secondary | ICD-10-CM | POA: Diagnosis not present

## 2020-09-19 DIAGNOSIS — A4159 Other Gram-negative sepsis: Secondary | ICD-10-CM | POA: Diagnosis not present

## 2020-09-19 DIAGNOSIS — G8250 Quadriplegia, unspecified: Secondary | ICD-10-CM | POA: Diagnosis not present

## 2020-09-19 LAB — GLUCOSE, CAPILLARY
Glucose-Capillary: 120 mg/dL — ABNORMAL HIGH (ref 70–99)
Glucose-Capillary: 133 mg/dL — ABNORMAL HIGH (ref 70–99)
Glucose-Capillary: 166 mg/dL — ABNORMAL HIGH (ref 70–99)
Glucose-Capillary: 160 mg/dL — ABNORMAL HIGH (ref 70–99)
Glucose-Capillary: 151 mg/dL — ABNORMAL HIGH (ref 70–99)

## 2020-09-19 LAB — POCT I-STAT 7, (LYTES, BLD GAS, ICA,H+H)
Acid-Base Excess: 2 mmol/L (ref 0.0–2.0)
Acid-Base Excess: 2 mmol/L (ref 0.0–2.0)
Bicarbonate: 28.7 mmol/L — ABNORMAL HIGH (ref 20.0–28.0)
Bicarbonate: 29.1 mmol/L — ABNORMAL HIGH (ref 20.0–28.0)
Calcium, Ion: 1.31 mmol/L (ref 1.15–1.40)
Calcium, Ion: 1.33 mmol/L (ref 1.15–1.40)
HCT: 28 % — ABNORMAL LOW (ref 39.0–52.0)
HCT: 29 % — ABNORMAL LOW (ref 39.0–52.0)
Hemoglobin: 9.5 g/dL — ABNORMAL LOW (ref 13.0–17.0)
Hemoglobin: 9.9 g/dL — ABNORMAL LOW (ref 13.0–17.0)
O2 Saturation: 98 %
O2 Saturation: 98 %
Patient temperature: 98.3
Potassium: 3.9 mmol/L (ref 3.5–5.1)
Potassium: 3.9 mmol/L (ref 3.5–5.1)
Sodium: 136 mmol/L (ref 135–145)
Sodium: 136 mmol/L (ref 135–145)
TCO2: 30 mmol/L (ref 22–32)
TCO2: 31 mmol/L (ref 22–32)
pCO2 arterial: 54.1 mmHg — ABNORMAL HIGH (ref 32.0–48.0)
pCO2 arterial: 59 mmHg — ABNORMAL HIGH (ref 32.0–48.0)
pH, Arterial: 7.301 — ABNORMAL LOW (ref 7.350–7.450)
pH, Arterial: 7.332 — ABNORMAL LOW (ref 7.350–7.450)
pO2, Arterial: 114 mmHg — ABNORMAL HIGH (ref 83.0–108.0)
pO2, Arterial: 115 mmHg — ABNORMAL HIGH (ref 83.0–108.0)

## 2020-09-19 LAB — BASIC METABOLIC PANEL
Anion gap: 12 (ref 5–15)
BUN: 51 mg/dL — ABNORMAL HIGH (ref 8–23)
CO2: 25 mmol/L (ref 22–32)
Calcium: 9.4 mg/dL (ref 8.9–10.3)
Chloride: 96 mmol/L — ABNORMAL LOW (ref 98–111)
Creatinine, Ser: 0.98 mg/dL (ref 0.61–1.24)
GFR, Estimated: >60 mL/min (ref >60)
Glucose, Bld: 136 mg/dL — ABNORMAL HIGH (ref 70–99)
Potassium: 3.6 mmol/L (ref 3.5–5.1)
Sodium: 133 mmol/L — ABNORMAL LOW (ref 135–145)

## 2020-09-19 LAB — CBC
HCT: 26.2 % — ABNORMAL LOW (ref 39.0–52.0)
Hemoglobin: 8.3 g/dL — ABNORMAL LOW (ref 13.0–17.0)
MCH: 25.9 pg — ABNORMAL LOW (ref 26.0–34.0)
MCHC: 31.7 g/dL (ref 30.0–36.0)
MCV: 81.6 fL (ref 80.0–100.0)
Platelets: 398 10*3/uL (ref 150–400)
RBC: 3.21 MIL/uL — ABNORMAL LOW (ref 4.22–5.81)
RDW: 19.9 % — ABNORMAL HIGH (ref 11.5–15.5)
WBC: 23.8 10*3/uL — ABNORMAL HIGH (ref 4.0–10.5)
nRBC: 1 % — ABNORMAL HIGH (ref 0.0–0.2)

## 2020-09-19 LAB — MAGNESIUM: Magnesium: 2.2 mg/dL (ref 1.7–2.4)

## 2020-09-19 LAB — LACTOFERRIN, FECAL, QUALITATIVE: Lactoferrin, Fecal, Qual: NEGATIVE

## 2020-09-19 MED ORDER — CHLORHEXIDINE GLUCONATE CLOTH 2 % EX PADS: 6.0000 | MEDICATED_PAD | Freq: Every day | CUTANEOUS | Status: DC

## 2020-09-19 NOTE — Progress Notes (Signed)
[facility] NEPHROLOGY PROGRESS NOTE  Assessment/ Plan:  # Acute kidney Injury-prolonged dialysis dependency, now progressed to ESRD:  ATN from septic shock-without any evidence of renal recovery to date . initiated on CRRT 07/18/20 and transitioned to IHD.  Has TDC, continue with HD on TTS schedule for now.  Status post HD on 9/6 with 2 L UF, tolerated well.  Plan for next HD tomorrow.  # Vascular access - had left forearm AVG placed 08/19/2020 but unfortunately had clotted.  s/p thrombectomy and revision 9/2, graft can be used 4 weeks thereafter.  # Septic shock/ventilator associated pneumonia due to recurrent Acinetobacter infection: Off antibiotics and stable hemodynamics.  #  Chronic respiratory failure: Status post tracheostomy with ventilator management per CCM.  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  # Chronic kidney disease/metabolic bone disease: Calcium and phosphorus level under control with hemodialysis, off binders  # Anemia: contiue ESA, monitor Hb.  Transfuse as needed.  # Acute metabolic encephalopathy: Continue management as above.,  Supportive care.  # Disposition - pt cannot return to [facility] now that he is ESRD. He is going to [facility] in [place], [place]. He needs to have [place] Medicaid & his AVG needs to be mature prior to being accepted. Other possible barrier is pt is a permanent trach & vent pt.     Subjective: Seen and examined in ICU.  Remains anuric.  No new event.  Objective Vital signs in last 24 hours: Vitals:   09/19/20 0600 09/19/20 0630 09/19/20 0700 09/19/20 0750  BP: 139/88 131/81 121/78   Pulse: 93 89 (!) 102   Resp: (!) 22 (!) 22 (!) 22   Temp:    98.9 F (37.2 C)  TempSrc:    Oral  SpO2: 98% 96% 98%   Weight:      Height:       Weight change: 0 kg  Intake/Output Summary (Last 24 hours) at 09/19/2020 0827 Last data filed at 09/19/2020 0600 Gross per 24 hour  Intake 1165 ml  Output  2080 ml  Net -915 ml        Labs: Basic Metabolic Panel: Recent Labs  Lab 09/13/20 0343 09/15/20 0635 09/18/20 1106 09/19/20 0017 09/19/20 0500  NA 132* 133* 130* 136 133*  K 4.0 3.7 3.3* 3.9 3.6  CL 97* 98 95*  --  96*  CO2 25 24 24   --  25  GLUCOSE 117* 200* 119*  --  136*  BUN 62* 53* 66*  --  51*  CREATININE 1.24 1.22 1.16  --  0.98  CALCIUM 8.8* 9.0 9.5  --  9.4  PHOS 5.2* 5.2* 3.8  --   --     Liver Function Tests: Recent Labs  Lab 09/13/20 0343 09/15/20 0635 09/18/20 1106  ALBUMIN 1.6* 1.6* 1.8*    No results for input(s): LIPASE, AMYLASE in the last 168 hours. No results for input(s): AMMONIA in the last 168 hours. CBC: Recent Labs  Lab 09/15/20 0500 09/16/20 0500 09/17/20 0457 09/18/20 0328 09/19/20 0017 09/19/20 0500  WBC 20.2* 21.7* 20.0* 22.5*  --  23.8*  HGB 7.3* 7.2* 7.0* 7.6* 9.5* 8.3*  HCT 22.7* 23.1* 22.1* 23.8* 28.0* 26.2*  MCV 81.1 81.9 80.7 81.0  --  81.6  PLT 380 442* 381 361  --  398    Cardiac Enzymes: No results for input(s): CKTOTAL, CKMB, CKMBINDEX, TROPONINI in the last 168 hours. CBG: Recent Labs  Lab 09/18/20 1545 09/18/20 1954 09/18/20  2343 09/19/20 0354 09/19/20 0748  GLUCAP 126* 165* 160* 120* 133*     Iron Studies: No results for input(s): IRON, TIBC, TRANSFERRIN, FERRITIN in the last 72 hours.  Studies/Results: DG Abd Portable 1V  Result Date: 09/18/2020 CLINICAL DATA:  Abdominal distension. EXAM: PORTABLE ABDOMEN - 1 VIEW COMPARISON:  Abdominal x-ray dated September 07, 2020. FINDINGS: Feeding tube has slightly retracted in the interval, now at the junction of second and third portions of the duodenum. Unchanged gastrostomy tube and right ureteral stent. Normal bowel gas pattern. No acute osseous abnormality. IMPRESSION: 1. Slight retraction of the feeding tube, now at the junction of second and third portions of the duodenum. 2. No acute findings. Electronically Signed   By: Titus Dubin M.D.   On: 09/18/2020  09:03    Medications: Infusions:  sodium chloride 10 mL/hr at 10/08/2020 1429   sodium chloride 500 mL (09/01/20 1408)   sodium chloride     sodium chloride     sodium chloride     sodium chloride     feeding supplement (NEPRO CARB STEADY) 45 mL/hr at 09/19/20 0600    Scheduled Medications:  amiodarone  200 mg Per Tube Daily   apixaban  5 mg Per Tube BID   buPROPion  75 mg Per Tube BID   chlorhexidine gluconate (MEDLINE KIT)  15 mL Mouth Rinse BID   Chlorhexidine Gluconate Cloth  6 each Topical Daily   Chlorhexidine Gluconate Cloth  6 each Topical Q0600   darbepoetin (ARANESP) injection - DIALYSIS  100 mcg Intravenous Q Tue-HD   feeding supplement (PROSource TF)  45 mL Per Tube TID   glycopyrrolate  1 mg Per Tube BID   guaiFENesin  5 mL Per Tube BID   insulin aspart  0-9 Units Subcutaneous Q4H   insulin glargine-yfgn  30 Units Subcutaneous Daily   lidocaine  1 patch Transdermal Q24H   mouth rinse  15 mL Mouth Rinse 10 times per day   metaxalone  800 mg Per Tube TID   midodrine  5 mg Per Tube TID   multivitamin  1 tablet Per Tube QHS   pantoprazole sodium  40 mg Per Tube QHS   scopolamine  1 patch Transdermal Q72H   sertraline  100 mg Per Tube Daily   simethicone  40 mg Per Tube QID   sodium chloride flush  10-40 mL Intracatheter Q12H    have reviewed scheduled and prn medications.  Physical Exam: Unchanged from yesterday. General:NAD, tracheostomy with secretions, able to open Heart:RRR, s1s2 nl Lungs: Coarse breath sound bilateral, no increased work of breathing. Abdomen:soft, Non-tender, PEG tube and ostomy bag Extremities:Trace edema Dialysis Access: LIJ TDC, LUE AVG T/B+  Luis Orr Luis Orr 09/19/2020,8:27 AM  LOS: 70 days

## 2020-09-19 NOTE — Progress Notes (Signed)
Per CCM MD hold scheduled tracheostomy change at this time. RT will continue to monitor.

## 2020-09-19 NOTE — Progress Notes (Addendum)
[facility] PROGRESS NOTE  Luis Orr GNF:[id number] DOB: 1950-07-04 DOA: 06/28/2020 PCP: Townsend Roger, MD  Status: Remains inpatient appropriate because:Hemodynamically unstable, Persistent severe electrolyte disturbances, Unsafe d/c plan, and Inpatient level of care appropriate due to severity of illness  Dispo: The patient is from: [facility] vent capable              Anticipated d/c is to: SNF Vent capable w/ access to in facility/stretcher based HD-a facility in [place] has been located in preauthorization in progress.              Patient currently is not medically stable to d/c.   Difficult to place patient Yes    Level of care: ICU  Code Status: Partial (no CPR and no defibrillation or cardioversion) Family Communication: Wife June on 8/22.  Of note the wife is also the POA DVT prophylaxis: IV Heparin COVID vaccination status: Unknown    HPI:  70 y.o. 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 Patient presented secondary to altered mental status and hypotension with recurrent pneumonia requiring vasopressor support and ICU admission.he has had multiple recent admissions for sepsis secondary to various infections including UTIs, pneumonias and bacteremia in setting of MDR Klebsiella, MRSA and pseudomonas. Most recent admission September 2021 in setting of sepsis due to MDR Klebsiella with right ureteral calculus s/p double J ureteral stent placement and treated with meropenem x 10days. He was discharged back to [facility].  ICU significant events: 6/29 admitted to ICU for septic shock on levo to maintain MAP>65, continued on vent support 6/29 Blood Cx>> staph species in 1 of 4 cultures drawn>> suspect contaminant 6/30 Off pressors 7/2 ID Consult for MDR acinetobacter in trach asp, Meropenem changed to unasyn 7/2 PRBC transfusion, iron replacement 7/5 Pressors added again. Central line placed.  Drainage from gastrostomy tube >> CT Abd without fluid collection around gastrostomy tube but with diffuse body wall edema. Echo EF 50-55%, mild LVH 7/6 Renal Replacement Therapy ; Code status changed to partial Code (DNR) 7/7 Transfuse 1U PRBC 7/9 add solu cortef 7/10 off pressors, weaning stress steroids 7/11 Pressors added back on again 7/12 ID consult due to rising WBC, hypothermia, hypotension concern for worsening infection. Vancomycin added. Repeat blood cx collected. 7/15 CRRT stopped 7/17 Restarted on low dose levo. No evidence of new infection. Held off on antibiotics. Nephrology contacted family and decision was made to continue iHD this week to allow additional time for renal recovery. Patient is not the best long term iHD candidate but family wants aggressive care 7/18 plans for iHD, tolerated with 2L removed 719 slight drop in hgb to 6.9 will transfuse 1 unit PRBC 7/22 > 7/26: Tolerating HD. TOC consulted for placement. Will likely need out of state placement. 8/15 WBCs greater than 28,000, hypoglycemia, procalcitonin >18, chest x-ray consistent with pneumonia, recent blood cultures consistent with contamination.  Unasyn IV resumed after sputum culture obtained 8/16 consultation placed with ethics committee regarding evaluation for futility of care 8/16 Sputum cx + for Acinetobacter with sensitivities pending 8/17 extensive conversation held with patient's wife regarding patient's poor prognosis and reemergence of Acinetobacter VAP.  Also discussed with her that patient has been able to convey to multiple staff that he does not want to continue on the ventilator or with dialysis.  Wife reports that the patient does not understand what he is saying, she is the POA and that she wants to continue aggressive care. 8/18 patient  now telling nephrology, myself and the rest of the staff he replies yes to wanting to remain on dialysis and on the ventilator and this direct question is  asked. 8/18 Acinetobacter sensitive to Unasyn only.  Discussed with ID/Dr. Juleen China.  Plan is to administer antibiotics for 7-day since patient has not had any increased O2 needs and remains afebrile.  If any concerns over worsening symptoms can give an additional 3 days. Developed moderate bleeding around tracheostomy site on 8/19.  Likely related to combination of Eliquis and heparin required for CRRT.  We will hold Eliquis for now and monitor degree of bleeding Eliquis resumed on 8/22.  Overnight into 8/23 patient bit his tongue and had bleeding.  Also had some bleeding around trach likely from oral bleeding.  Also though did have bleeding from old central line site. 8/23 had transient bleeding around trach and prior central line insertion site overnight.  Patient had bitten tongue and this likely explain the bleeding.  This morning patient was not having any bleeding.  On 8/25 had redeveloped slow ooze bleeding from mouth and trach.  Eliquis discontinued in favor of IV heparin especially given patient will undergo surgical procedure for vascular access on 8/29 8/30 Left FA AVG occluded 9/2 status post thrombectomy and revision of LUE AV graft 9/4 LUE edema with negative venous duplex. 9/5 heparin discontinued and Eliquis resumed    Subjective: Patient awake and eyes are open.  He does not attempt to respond to any questions that I am asking him.  He looks extremely uncomfortable.  Objective: Vitals:   09/19/20 0400 09/19/20 0500  BP: 140/76 125/82  Pulse: 90 91  Resp: (!) 22 (!) 22  Temp: 99 F (37.2 C)   SpO2: 100% 98%    Intake/Output Summary (Last 24 hours) at 09/19/2020 0727 Last data filed at 09/18/2020 2216 Gross per 24 hour  Intake 835 ml  Output 2080 ml  Net -1245 ml   Filed Weights   09/18/20 0950 09/18/20 1402 09/19/20 0449  Weight: 81.8 kg 80 kg 79.1 kg    Exam:  Constitutional: Awake, uncomfortable. Respiratory: Trach: 6.0 XLT cuffed, Vent settings: PRVC mode, Fio2  40%, rate 20, VT 510, PEEP 5; lungs remain coarse with expiratory rhonchi bilaterally although diminished from yesterday.  Expiratory effort per vent (diaphragm paralyzed).   Cardiovascular: NSR, normotensive, no bilateral lower extremity edema.  Stable LUE edema.  Erythema directly over AVG site with palpable thrill Abdomen: LBM 9/07, PEG tube clogged.  Cortrack in place for feedings until back ordered feeding tube available. Left lower quadrant colostomy in place, abdomen distended and mildly tympanitic diffusely tender but no guarding or rebounding.  Noted excessive gas collected in colostomy bag with low volume liquid stool. Neurologic: CN 2-12 grossly intact on visual inspection- patient with quadriplegia so unable to independently move extremities.  No sensation below shoulders. Psychiatric: Awake but not attempting to respond to questions asked.   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 Hulen Skains as well as PCCM  physician.  no symptoms at this time that are suggestive of C. difficile noting that his WBC is chronically elevated and abdomen chronically distended.   AKI with oliguria/volume overload/ New CKD requiring HD Anuric Nephrology following, initially required CRRT and has transitioned to HD (due to staffing issues continues on CRRT at bedside) LUE AVG functional   Septic  shock 2/2 VAP POA Presented with sepsis physiology and profound shock which has now resolved  Chronic thoracic back pain Continue Oxy and fentanyl prn; Skelaxin and lidocaine patch  History of paroxysmal atrial fibrillation Patient was on amiodarone prior to admission -maintaining SR AVG functional heparin discontinued on 9 5 and Eliquis resumed  TSH was WNL this admission QTc 420 ms on 0/81   Acute metabolic encephalopathy Resolved Secondary to acute illness. CT head negative for acute process.  Back at baseline.   Diabetes mellitus, type 2 Continue Semglee 30 units daily.  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  Resolved.  Quadriplegia secondary to compressive myelopathy.  Previously resided at [facility] but with poor performance scores as well as poor rehabilitation potential [facility] will not accept him back if he is HD dependent (he must be able to sit up in a chair for dialysis.)   Microcytic anemia/Acute anemia superimposed on anemia of chronic kidney disease Patient required 4 units of PRBC to date.  Hemoglobin on 8/26 was 8.5 and as of 9/5 was down to 7.0 with on call PCCM nocturnist ordering 1 unit of packed red blood cells to be infused today Continue Aranesp   Severe protein calorie malnutrition PEG tube unable to be declogged successfully.  Feeding tube on backorder.  Continue Cortrak for feedings and meds. Patient with sore throat and throat discomfort secondary to cortrack tube.  Chloraseptic Spray ordered.  Pressure injury/stage IV sacral decubitus Stage IV medial/posterior sacrum present on admission.  Right/posterior/lateral ankle unknown if present on admission.  DTI left ear, not present on admission. (For  additional documentation see wound care notes)    Data Reviewed: Basic Metabolic Panel: Recent Labs  Lab 09/13/20 0343 09/15/20 0635 09/18/20 1106 09/19/20 0017 09/19/20 0500  NA 132* 133* 130* 136 133*  K 4.0 3.7 3.3* 3.9 3.6  CL 97* 98 95*  --  96*  CO2 $Re'25 24 24  'hXk$ --  25  GLUCOSE 117* 200* 119*  --  136*  BUN 62* 53* 66*  --  51*  CREATININE 1.24 1.22 1.16  --  0.98  CALCIUM 8.8* 9.0 9.5  --  9.4  MG  --   --   --   --  2.2  PHOS 5.2* 5.2* 3.8  --   --    Liver Function Tests: Recent Labs  Lab 09/13/20 0343 09/15/20 0635 09/18/20 1106  ALBUMIN 1.6* 1.6* 1.8*    CBC: Recent Labs  Lab 09/15/20 0500 09/16/20 0500 09/17/20 0457 09/18/20 0328 09/19/20 0017 09/19/20 0500  WBC 20.2* 21.7* 20.0* 22.5*  --  23.8*  HGB 7.3* 7.2* 7.0* 7.6* 9.5* 8.3*  HCT 22.7* 23.1* 22.1* 23.8* 28.0* 26.2*  MCV 81.1 81.9 80.7 81.0  --  81.6  PLT 380 442* 381 361  --  398   CBG: Recent Labs  Lab 09/18/20 1102 09/18/20 1545 09/18/20 1954 09/18/20 2343 09/19/20 0354  GLUCAP 129* 126* 165* 160* 120*       Studies: DG Abd Portable 1V  Result Date: 09/18/2020 CLINICAL DATA:  Abdominal distension. EXAM: PORTABLE ABDOMEN - 1 VIEW COMPARISON:  Abdominal x-ray dated September 07, 2020. FINDINGS: Feeding tube has slightly retracted in the interval, now at the junction of second and third portions of the duodenum. Unchanged gastrostomy tube and right ureteral stent. Normal bowel gas pattern. No acute osseous abnormality. IMPRESSION: 1. Slight retraction of the feeding tube, now at the junction of second and third portions of the duodenum. 2. No acute findings. Electronically Signed   By: Titus Dubin M.D.   On: 09/18/2020 09:03    Scheduled Meds:  amiodarone  200 mg Per Tube Daily   apixaban  5 mg Per Tube BID   buPROPion  75 mg Per Tube BID   chlorhexidine gluconate (MEDLINE KIT)  15 mL Mouth Rinse BID   Chlorhexidine Gluconate Cloth  6 each Topical Daily   Chlorhexidine Gluconate  Cloth  6 each Topical Q0600   darbepoetin (ARANESP) injection - DIALYSIS  100 mcg Intravenous Q Tue-HD   feeding supplement (PROSource TF)  45 mL Per Tube TID   glycopyrrolate  1 mg Per Tube BID   guaiFENesin  5 mL Per Tube BID   insulin aspart  0-9 Units Subcutaneous Q4H   insulin glargine-yfgn  30 Units Subcutaneous Daily   lidocaine  1 patch Transdermal Q24H   mouth rinse  15 mL Mouth Rinse 10 times per day   metaxalone  800 mg Per Tube TID   midodrine  5 mg Per Tube TID   multivitamin  1 tablet Per Tube QHS   pantoprazole sodium  40 mg Per Tube QHS   scopolamine  1 patch Transdermal Q72H   sertraline  100 mg Per Tube Daily   simethicone  40 mg Per Tube QID   sodium chloride flush  10-40 mL Intracatheter Q12H   Continuous Infusions:  sodium chloride 10 mL/hr at 09/25/2020 1429   sodium chloride 500 mL (09/01/20 1408)   sodium chloride     sodium chloride     sodium chloride     sodium chloride     feeding supplement (NEPRO CARB STEADY) 1,000 mL (09/18/20 1828)    Active Problems:   Ventilator dependent (HCC)   Septic shock (HCC)   Pressure injury of skin   Acute respiratory failure with hypoxia (HCC)   AKI (acute kidney injury) ([place])   Hypotension   Goals of care, counseling/discussion   Quadriplegia ([place])   Chronic kidney disease requiring chronic dialysis ([place])   Hyperglycemia   Sepsis due to Acinetobacter baumannii ([place])   Pneumonia due to Acinetobacter species ([place])   Dysautonomia ([place])   Chronic midline thoracic back pain   Abdominal distention   Consultants: PCCM Nephrology Infectious disease Palliative care medicine  Procedures: Echocardiogram Insertion of double-lumen HD catheter  Antibiotics: Cefepime x1 dose Meropenem 6/29 through 7/2 Vancomycin 6/29 through 6/30 Unasyn 7/2 through 7/15 Unasyn 8/15 >8/22   Time spent: 35 minutes    Erin Hearing ANP  [facility] 7 am - 330 pm/M-F for direct patient care and secure chat Please  refer to Amion for contact info 70  days

## 2020-09-20 DIAGNOSIS — A419 Sepsis, unspecified organism: Secondary | ICD-10-CM | POA: Diagnosis not present

## 2020-09-20 DIAGNOSIS — G8250 Quadriplegia, unspecified: Secondary | ICD-10-CM | POA: Diagnosis not present

## 2020-09-20 DIAGNOSIS — Z9911 Dependence on respirator [ventilator] status: Secondary | ICD-10-CM | POA: Diagnosis not present

## 2020-09-20 DIAGNOSIS — A4159 Other Gram-negative sepsis: Secondary | ICD-10-CM | POA: Diagnosis not present

## 2020-09-20 DIAGNOSIS — J9601 Acute respiratory failure with hypoxia: Secondary | ICD-10-CM | POA: Diagnosis not present

## 2020-09-20 LAB — RENAL FUNCTION PANEL
Albumin: 1.6 g/dL — ABNORMAL LOW (ref 3.5–5.0)
Anion gap: 14 (ref 5–15)
BUN: 79 mg/dL — ABNORMAL HIGH (ref 8–23)
CO2: 24 mmol/L (ref 22–32)
Calcium: 9.4 mg/dL (ref 8.9–10.3)
Chloride: 95 mmol/L — ABNORMAL LOW (ref 98–111)
Creatinine, Ser: 1.32 mg/dL — ABNORMAL HIGH (ref 0.61–1.24)
GFR, Estimated: 58 mL/min — ABNORMAL LOW (ref >60)
Glucose, Bld: 133 mg/dL — ABNORMAL HIGH (ref 70–99)
Phosphorus: 4.5 mg/dL (ref 2.5–4.6)
Potassium: 3.7 mmol/L (ref 3.5–5.1)
Sodium: 133 mmol/L — ABNORMAL LOW (ref 135–145)

## 2020-09-20 LAB — GLUCOSE, CAPILLARY
Glucose-Capillary: 111 mg/dL — ABNORMAL HIGH (ref 70–99)
Glucose-Capillary: 113 mg/dL — ABNORMAL HIGH (ref 70–99)
Glucose-Capillary: 119 mg/dL — ABNORMAL HIGH (ref 70–99)
Glucose-Capillary: 123 mg/dL — ABNORMAL HIGH (ref 70–99)
Glucose-Capillary: 126 mg/dL — ABNORMAL HIGH (ref 70–99)
Glucose-Capillary: 133 mg/dL — ABNORMAL HIGH (ref 70–99)
Glucose-Capillary: 145 mg/dL — ABNORMAL HIGH (ref 70–99)

## 2020-09-20 LAB — CBC
HCT: 23.4 % — ABNORMAL LOW (ref 39.0–52.0)
Hemoglobin: 7.6 g/dL — ABNORMAL LOW (ref 13.0–17.0)
MCH: 26.1 pg (ref 26.0–34.0)
MCHC: 32.5 g/dL (ref 30.0–36.0)
MCV: 80.4 fL (ref 80.0–100.0)
Platelets: 388 10*3/uL (ref 150–400)
RBC: 2.91 MIL/uL — ABNORMAL LOW (ref 4.22–5.81)
RDW: 19.7 % — ABNORMAL HIGH (ref 11.5–15.5)
WBC: 18.5 10*3/uL — ABNORMAL HIGH (ref 4.0–10.5)
nRBC: 1.8 % — ABNORMAL HIGH (ref 0.0–0.2)

## 2020-09-20 MED ORDER — GLYCOPYRROLATE 1 MG PO TABS
1.0000 mg | ORAL_TABLET | Freq: Two times a day (BID) | ORAL | Status: DC | PRN
  Administered 2020-09-21 – 2020-10-12 (×3): 1 mg
  Filled 2020-09-20 (×3): qty 1

## 2020-09-20 NOTE — Progress Notes (Signed)
Assisted tele visit to patient with wife.  Michaela Broski R Alexxus Sobh, RN   

## 2020-09-20 NOTE — Progress Notes (Signed)
[facility] NEPHROLOGY PROGRESS NOTE  Assessment/ Plan:  # Acute kidney Injury-prolonged dialysis dependency, now progressed to ESRD:  ATN from septic shock-without any evidence of renal recovery to date . initiated on CRRT 07/18/20 and transitioned to IHD.  Has TDC, continue with HD on TTS schedule for now.  Status post HD on 9/6 with 2 L UF, tolerated well.  Plan for next HD today.  # Vascular access - had left forearm AVG placed 09/01/2020 but unfortunately had clotted.  s/p thrombectomy and revision 9/2, graft can be used 4 weeks thereafter.  Swelling of left upper extremity noted continue arm elevation and supportive care.  # Septic shock/ventilator associated pneumonia due to recurrent Acinetobacter infection: Off antibiotics and stable hemodynamics.  #  Chronic respiratory failure: Status post tracheostomy with ventilator management per CCM.  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  # Chronic kidney disease/metabolic bone disease: Calcium and phosphorus level under control with hemodialysis, off binders  # Anemia: contiue ESA, monitor Hb.  Transfuse as needed.  # Acute metabolic encephalopathy: Continue management as above.,  Supportive care.  # Disposition - pt cannot return to [facility] now that he is ESRD. He is going to [facility] in [place], [place]. He needs to have [place] Medicaid & his AVG needs to be mature prior to being accepted. Other possible barrier is pt is a permanent trach & vent pt.     Subjective: Seen and examined in ICU.  No urine output, no new event.  Plan for dialysis today. Objective Vital signs in last 24 hours: Vitals:   09/20/20 0700 09/20/20 0752 09/20/20 0800 09/20/20 0820  BP: (!) 170/78  (!) 160/78 (!) 160/78  Pulse: 83  85 88  Resp: 20  20 20   Temp:  99 F (37.2 C)    TempSrc:  Axillary    SpO2: 100%  100% 98%  Weight:      Height:       Weight change: -2.6 kg  Intake/Output Summary (Last  24 hours) at 09/20/2020 0900 Last data filed at 09/20/2020 0000 Gross per 24 hour  Intake 775 ml  Output 25 ml  Net 750 ml        Labs: Basic Metabolic Panel: Recent Labs  Lab 09/15/20 0635 09/18/20 1106 09/19/20 0017 09/19/20 0500 09/19/20 1607  NA 133* 130* 136 133* 136  K 3.7 3.3* 3.9 3.6 3.9  CL 98 95*  --  96*  --   CO2 24 24  --  25  --   GLUCOSE 200* 119*  --  136*  --   BUN 53* 66*  --  51*  --   CREATININE 1.22 1.16  --  0.98  --   CALCIUM 9.0 9.5  --  9.4  --   PHOS 5.2* 3.8  --   --   --     Liver Function Tests: Recent Labs  Lab 09/15/20 0635 09/18/20 1106  ALBUMIN 1.6* 1.8*    No results for input(s): LIPASE, AMYLASE in the last 168 hours. No results for input(s): AMMONIA in the last 168 hours. CBC: Recent Labs  Lab 09/16/20 0500 09/17/20 0457 09/18/20 0328 09/19/20 0017 09/19/20 0500 09/19/20 1607 09/20/20 0335  WBC 21.7* 20.0* 22.5*  --  23.8*  --  18.5*  HGB 7.2* 7.0* 7.6*   < > 8.3* 9.9* 7.6*  HCT 23.1* 22.1* 23.8*   < > 26.2* 29.0* 23.4*  MCV 81.9 80.7 81.0  --  81.6  --  80.4  PLT 442* 381 361  --  398  --  388   < > = values in this interval not displayed.    Cardiac Enzymes: No results for input(s): CKTOTAL, CKMB, CKMBINDEX, TROPONINI in the last 168 hours. CBG: Recent Labs  Lab 09/19/20 1550 09/19/20 2045 09/20/20 0036 09/20/20 0327 09/20/20 0750  GLUCAP 160* 151* 126* 113* 123*     Iron Studies: No results for input(s): IRON, TIBC, TRANSFERRIN, FERRITIN in the last 72 hours.  Studies/Results: DG CHEST PORT 1 VIEW  Result Date: 09/19/2020 CLINICAL DATA:  Chronic respiratory failure with hypoxia (HCC) J96.11 (ICD-10-CM) EXAM: PORTABLE CHEST 1 VIEW COMPARISON:  09/01/2020. FINDINGS: Similar bibasilar consolidation and/or atelectasis. Possible small layering bilateral pleural effusions. No visible pneumothorax on this single semi erect radiograph. Similar cardiomediastinal silhouette, accentuated by AP technique. Similar  position of a dual lumen right IJ central venous catheter with the tip projecting at the superior cavoatrial junction. Similar position of a left IJ central venous catheter with the tip projecting at the lower SVC. Tracheostomy tube with the tip at the level of the clavicular heads. Enteric tube courses below the diaphragm with the tip outside the field of view. IMPRESSION: 1. Similar bibasilar consolidation and/or atelectasis. 2. Possible small layering bilateral pleural effusions. Electronically Signed   By: Margaretha Sheffield M.D.   On: 09/19/2020 11:26    Medications: Infusions:  sodium chloride 10 mL/hr at 09/19/2020 1429   sodium chloride 500 mL (09/01/20 1408)   sodium chloride     sodium chloride     sodium chloride     sodium chloride     feeding supplement (NEPRO CARB STEADY) 45 mL/hr at 09/19/20 0600    Scheduled Medications:  amiodarone  200 mg Per Tube Daily   apixaban  5 mg Per Tube BID   buPROPion  75 mg Per Tube BID   chlorhexidine gluconate (MEDLINE KIT)  15 mL Mouth Rinse BID   Chlorhexidine Gluconate Cloth  6 each Topical Daily   Chlorhexidine Gluconate Cloth  6 each Topical Q0600   Chlorhexidine Gluconate Cloth  6 each Topical Q0600   darbepoetin (ARANESP) injection - DIALYSIS  100 mcg Intravenous Q Tue-HD   feeding supplement (PROSource TF)  45 mL Per Tube TID   glycopyrrolate  1 mg Per Tube BID   guaiFENesin  5 mL Per Tube BID   insulin aspart  0-9 Units Subcutaneous Q4H   insulin glargine-yfgn  30 Units Subcutaneous Daily   lidocaine  1 patch Transdermal Q24H   mouth rinse  15 mL Mouth Rinse 10 times per day   metaxalone  800 mg Per Tube TID   midodrine  5 mg Per Tube TID   multivitamin  1 tablet Per Tube QHS   pantoprazole sodium  40 mg Per Tube QHS   scopolamine  1 patch Transdermal Q72H   sertraline  100 mg Per Tube Daily   simethicone  40 mg Per Tube QID   sodium chloride flush  10-40 mL Intracatheter Q12H    have reviewed scheduled and prn  medications.  Physical Exam: Unchanged from yesterday. General:NAD, tracheostomy with secretions, able to open, on vent Heart:RRR, s1s2 nl Lungs: Coarse breath sound bilateral, no increased work of breathing. Abdomen:soft, Non-tender, PEG tube and ostomy bag Extremities:Trace lower extremities edema Dialysis Access: LIJ TDC, LUE AVG T/B+, swelling of the left upper extremity noted.  Clytee Heinrich Prasad Lenis Nettleton 09/20/2020,9:00 AM  LOS: 71 days

## 2020-09-20 NOTE — Progress Notes (Signed)
   09/20/20 1545  Vitals  Temp 98 F (36.7 C)  Temp Source Oral  BP 138/79  MAP (mmHg) 97  BP Location Right Leg  BP Method Automatic  Patient Position (if appropriate) Lying  Pulse Rate (!) 106  Pulse Rate Source Monitor  ECG Heart Rate (!) 107  Resp 20  Oxygen Therapy  SpO2 97 %  O2 Device Ventilator  Dialysis Weight  Weight 77.8 kg  Type of Weight Post-Dialysis  Post-Hemodialysis Assessment  Rinseback Volume (mL) 250 mL  KECN 244 V  Dialyzer Clearance Lightly streaked  Duration of HD Treatment -hour(s) 3 hour(s)  Hemodialysis Intake (mL) 600 mL  UF Total -Machine (mL) 2634 mL  Net UF (mL) 2034 mL  Tolerated HD Treatment  (see progress note)  Post-Hemodialysis Comments tx complete-pt stable  Hemodialysis Catheter Right Internal jugular Double lumen Permanent (Tunneled)  Placement Date/Time: 08/07/20 1032   Placed prior to admission: No  Time Out: Correct patient;Correct site;Correct procedure  Maximum sterile barrier precautions: Hand hygiene;Cap;Mask;Sterile gown;Sterile gloves;Large sterile sheet  Site Prep: Chlorh...  Site Condition No complications  Blue Lumen Status Flushed;Heparin locked  Red Lumen Status Flushed;Heparin locked  Catheter fill solution Heparin 1000 units/ml  Catheter fill volume (Arterial) 1.9 cc  Catheter fill volume (Venous) 1.9  Dressing Type Transparent  Dressing Status Clean;Dry;Intact  Antimicrobial disc in place? Yes  Interventions Other (Comment) (assessed)  Drainage Description None  Dressing Change Due 09/21/20  Post treatment catheter status Capped and Clamped  Pt noted with tachycardia midway and for the duration of hd tx, HR running 110s-high 120s. SBP increase to 180s, with pt rhythm fluctuating in and out of Afib. Pt without distress. UF paused for approximately 30 minutes and BFR decreased to 350 with improvement noted to HR and BP. Pt noted to return to sinus tach. Primary nurse called to pt's room and made aware of pt's present  condition and UF stopped during tx. Unable to meet goal, UF=2 liters. Dr. Jonnie Finner made aware of the above assessment and HD tx result. Pt resting quietly, responsive to verbal stimuli, vital signs and pt stable at present time.

## 2020-09-20 NOTE — Progress Notes (Signed)
[facility] PROGRESS NOTE  Luis Orr GNF:[id number] DOB: 1950-07-04 DOA: 07/08/2020 PCP: Townsend Roger, MD  Status: Remains inpatient appropriate because:Hemodynamically unstable, Persistent severe electrolyte disturbances, Unsafe d/c plan, and Inpatient level of care appropriate due to severity of illness  Dispo: The patient is from: [facility] vent capable              Anticipated d/c is to: SNF Vent capable w/ access to in facility/stretcher based HD-a facility in [place] has been located in preauthorization in progress.              Patient currently is not medically stable to d/c.   Difficult to place patient Yes    Level of care: ICU  Code Status: Partial (no CPR and no defibrillation or cardioversion) Family Communication: Wife June on 8/22.  Of note the wife is also the POA DVT prophylaxis: IV Heparin COVID vaccination status: Unknown    HPI:  70 y.o. 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 Patient presented secondary to altered mental status and hypotension with recurrent pneumonia requiring vasopressor support and ICU admission.he has had multiple recent admissions for sepsis secondary to various infections including UTIs, pneumonias and bacteremia in setting of MDR Klebsiella, MRSA and pseudomonas. Most recent admission September 2021 in setting of sepsis due to MDR Klebsiella with right ureteral calculus s/p double J ureteral stent placement and treated with meropenem x 10days. He was discharged back to [facility].  ICU significant events: 6/29 admitted to ICU for septic shock on levo to maintain MAP>65, continued on vent support 6/29 Blood Cx>> staph species in 1 of 4 cultures drawn>> suspect contaminant 6/30 Off pressors 7/2 ID Consult for MDR acinetobacter in trach asp, Meropenem changed to unasyn 7/2 PRBC transfusion, iron replacement 7/5 Pressors added again. Central line placed.  Drainage from gastrostomy tube >> CT Abd without fluid collection around gastrostomy tube but with diffuse body wall edema. Echo EF 50-55%, mild LVH 7/6 Renal Replacement Therapy ; Code status changed to partial Code (DNR) 7/7 Transfuse 1U PRBC 7/9 add solu cortef 7/10 off pressors, weaning stress steroids 7/11 Pressors added back on again 7/12 ID consult due to rising WBC, hypothermia, hypotension concern for worsening infection. Vancomycin added. Repeat blood cx collected. 7/15 CRRT stopped 7/17 Restarted on low dose levo. No evidence of new infection. Held off on antibiotics. Nephrology contacted family and decision was made to continue iHD this week to allow additional time for renal recovery. Patient is not the best long term iHD candidate but family wants aggressive care 7/18 plans for iHD, tolerated with 2L removed 719 slight drop in hgb to 6.9 will transfuse 1 unit PRBC 7/22 > 7/26: Tolerating HD. TOC consulted for placement. Will likely need out of state placement. 8/15 WBCs greater than 28,000, hypoglycemia, procalcitonin >18, chest x-ray consistent with pneumonia, recent blood cultures consistent with contamination.  Unasyn IV resumed after sputum culture obtained 8/16 consultation placed with ethics committee regarding evaluation for futility of care 8/16 Sputum cx + for Acinetobacter with sensitivities pending 8/17 extensive conversation held with patient's wife regarding patient's poor prognosis and reemergence of Acinetobacter VAP.  Also discussed with her that patient has been able to convey to multiple staff that he does not want to continue on the ventilator or with dialysis.  Wife reports that the patient does not understand what he is saying, she is the POA and that she wants to continue aggressive care. 8/18 patient  now telling nephrology, myself and the rest of the staff he replies yes to wanting to remain on dialysis and on the ventilator and this direct question is  asked. 8/18 Acinetobacter sensitive to Unasyn only.  Discussed with ID/Dr. Juleen China.  Plan is to administer antibiotics for 7-day since patient has not had any increased O2 needs and remains afebrile.  If any concerns over worsening symptoms can give an additional 3 days. Developed moderate bleeding around tracheostomy site on 8/19.  Likely related to combination of Eliquis and heparin required for CRRT.  We will hold Eliquis for now and monitor degree of bleeding Eliquis resumed on 8/22.  Overnight into 8/23 patient bit his tongue and had bleeding.  Also had some bleeding around trach likely from oral bleeding.  Also though did have bleeding from old central line site. 8/23 had transient bleeding around trach and prior central line insertion site overnight.  Patient had bitten tongue and this likely explain the bleeding.  This morning patient was not having any bleeding.  On 8/25 had redeveloped slow ooze bleeding from mouth and trach.  Eliquis discontinued in favor of IV heparin especially given patient will undergo surgical procedure for vascular access on 8/29 8/30 Left FA AVG occluded 9/2 status post thrombectomy and revision of LUE AV graft 9/4 LUE edema with negative venous duplex. 9/5 heparin discontinued and Eliquis resumed    Subjective: Awake.  Made eye contact.  Determine if he was having any pain.  Objective: Vitals:   09/20/20 0344 09/20/20 0700  BP:  (!) 170/78  Pulse:  83  Resp:  20  Temp:    SpO2: 99% 100%    Intake/Output Summary (Last 24 hours) at 09/20/2020 0728 Last data filed at 09/20/2020 0000 Gross per 24 hour  Intake 865 ml  Output 70 ml  Net 795 ml   Filed Weights   09/18/20 0950 09/18/20 1402 09/19/20 0449  Weight: 81.8 kg 80 kg 79.1 kg    Exam:  Constitutional: Awake, does not appear to be in any distress Respiratory: Trach: 6.0 XLT cuffed, Vent settings: PRVC mode, Fio2 40%, rate 20, VT 510, PEEP 5; lungs remain coarse with expiratory rhonchi bilaterally  although diminished from yesterday. Cardiovascular: SR, normotensive, no bilateral lower extremity edema.  Stable LUE edema.  Erythema directly over AVG site with palpable thrill Abdomen: LBM 9/08, PEG tube clogged.  Cortrack in place for feedings; PEG tube in place but is occluded.  Left lower quadrant colostomy in place, abdomen distended but nontender with normoactive bowel sounds.  Noted excessive gas collected in colostomy bag with low volume liquid stool. Neurologic: Chronic quadriplegia so unable to independently move extremities.  No sensation below shoulders. Psychiatric: Awake but not attempting to respond to questions asked.   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 Acute sx's secondary to pneumonia/volume overload - redeveloped Acinetobacter pneumonia (8/14) and completed 7 days of Unasyn Ventilator management per PCCM  Recurrent ventilator associated pneumonia 2/2 recurrent Acinetobacter/sepsis without shock WBCs stable with positive sputum culture sensitive only to Unasyn ID/Dr Juleen China recommended 7 days of therapy -completed on 8/22   AKI with oliguria/volume overload/ New CKD requiring HD Anuric Nephrology following, initially required CRRT and has transitioned to HD (due to staffing issues continues on CRRT at bedside) LUE AVG functional   Septic shock 2/2 VAP POA Presented with sepsis physiology and profound shock which has now resolved  Chronic thoracic back pain Continue Oxy and  fentanyl prn; Skelaxin and lidocaine patch  History of paroxysmal atrial fibrillation Patient was on amiodarone prior to admission -maintaining SR AVG functional heparin discontinued on 9 5 and Eliquis resumed  TSH was WNL this admission QTc 420 ms on 1/79   Acute metabolic encephalopathy Resolved Secondary to acute illness. CT head negative for acute process.  Back at baseline.   Diabetes  mellitus, type 2 Continue Semglee 30 units daily.  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  Resolved.  Quadriplegia secondary to compressive myelopathy.  [facility] will not accept him back due to HD dependent and inability to sit up in chair for dialysis treatment   Microcytic anemia/Acute anemia superimposed on anemia of chronic kidney disease S/P 5 units of PRBC  Continue Aranesp and followHgb prn   Severe protein calorie malnutrition PEG tube unable to be declogged successfully.  Feeding tube on backorder.   Continue Cortrak for feedings and meds.  Pressure injury/stage IV sacral decubitus Stage IV medial/posterior sacrum present on admission.  Right/posterior/lateral ankle unknown if present on admission.  DTI left ear, not present on admission. (For additional documentation see wound care notes)    Data Reviewed: Basic Metabolic Panel: Recent Labs  Lab 09/15/20 0635 09/18/20 1106 09/19/20 0017 09/19/20 0500 09/19/20 1607  NA 133* 130* 136 133* 136  K 3.7 3.3* 3.9 3.6 3.9  CL 98 95*  --  96*  --   CO2 24 24  --  25  --   GLUCOSE 200* 119*  --  136*  --   BUN 53* 66*  --  51*  --   CREATININE 1.22 1.16  --  0.98  --   CALCIUM 9.0 9.5  --  9.4  --   MG  --   --   --  2.2  --   PHOS 5.2* 3.8  --   --   --    Liver Function Tests: Recent Labs  Lab 09/15/20 0635 09/18/20 1106  ALBUMIN 1.6* 1.8*    CBC: Recent Labs  Lab 09/16/20 0500 09/17/20 0457 09/18/20 0328 09/19/20 0017 09/19/20 0500 09/19/20 1607 09/20/20 0335  WBC 21.7* 20.0* 22.5*  --  23.8*  --  18.5*  HGB 7.2* 7.0* 7.6* 9.5* 8.3* 9.9* 7.6*  HCT 23.1* 22.1* 23.8* 28.0* 26.2* 29.0* 23.4*  MCV 81.9 80.7 81.0  --  81.6  --  80.4  PLT 442* 381 361  --  398   --  388   CBG: Recent Labs  Lab 09/19/20 1245 09/19/20 1550 09/19/20 2045 09/20/20 0036 09/20/20 0327  GLUCAP 166* 160* 151* 126* 113*       Studies: DG CHEST PORT 1 VIEW  Result Date: 09/19/2020 CLINICAL DATA:  Chronic respiratory failure with hypoxia (HCC) J96.11 (ICD-10-CM) EXAM: PORTABLE CHEST 1 VIEW COMPARISON:  09/01/2020. FINDINGS: Similar bibasilar consolidation and/or atelectasis. Possible small layering bilateral pleural effusions. No visible pneumothorax on this single semi erect radiograph. Similar cardiomediastinal silhouette, accentuated by AP technique. Similar position of a dual lumen right IJ central venous catheter with the tip projecting at the superior cavoatrial junction. Similar position of a left IJ central venous catheter with the tip projecting at the lower SVC. Tracheostomy tube with the tip at the level of the clavicular heads. Enteric tube courses below the  diaphragm with the tip outside the field of view. IMPRESSION: 1. Similar bibasilar consolidation and/or atelectasis. 2. Possible small layering bilateral pleural effusions. Electronically Signed   By: Margaretha Sheffield M.D.   On: 09/19/2020 11:26   DG Abd Portable 1V  Result Date: 09/18/2020 CLINICAL DATA:  Abdominal distension. EXAM: PORTABLE ABDOMEN - 1 VIEW COMPARISON:  Abdominal x-ray dated September 07, 2020. FINDINGS: Feeding tube has slightly retracted in the interval, now at the junction of second and third portions of the duodenum. Unchanged gastrostomy tube and right ureteral stent. Normal bowel gas pattern. No acute osseous abnormality. IMPRESSION: 1. Slight retraction of the feeding tube, now at the junction of second and third portions of the duodenum. 2. No acute findings. Electronically Signed   By: Titus Dubin M.D.   On: 09/18/2020 09:03    Scheduled Meds:  amiodarone  200 mg Per Tube Daily   apixaban  5 mg Per Tube BID   buPROPion  75 mg Per Tube BID   chlorhexidine gluconate (MEDLINE KIT)   15 mL Mouth Rinse BID   Chlorhexidine Gluconate Cloth  6 each Topical Daily   Chlorhexidine Gluconate Cloth  6 each Topical Q0600   Chlorhexidine Gluconate Cloth  6 each Topical Q0600   darbepoetin (ARANESP) injection - DIALYSIS  100 mcg Intravenous Q Tue-HD   feeding supplement (PROSource TF)  45 mL Per Tube TID   glycopyrrolate  1 mg Per Tube BID   guaiFENesin  5 mL Per Tube BID   insulin aspart  0-9 Units Subcutaneous Q4H   insulin glargine-yfgn  30 Units Subcutaneous Daily   lidocaine  1 patch Transdermal Q24H   mouth rinse  15 mL Mouth Rinse 10 times per day   metaxalone  800 mg Per Tube TID   midodrine  5 mg Per Tube TID   multivitamin  1 tablet Per Tube QHS   pantoprazole sodium  40 mg Per Tube QHS   scopolamine  1 patch Transdermal Q72H   sertraline  100 mg Per Tube Daily   simethicone  40 mg Per Tube QID   sodium chloride flush  10-40 mL Intracatheter Q12H   Continuous Infusions:  sodium chloride 10 mL/hr at 10/02/2020 1429   sodium chloride 500 mL (09/01/20 1408)   sodium chloride     sodium chloride     sodium chloride     sodium chloride     feeding supplement (NEPRO CARB STEADY) 45 mL/hr at 09/19/20 0600    Active Problems:   Ventilator dependent (HCC)   Septic shock (HCC)   Pressure injury of skin   Acute respiratory failure with hypoxia (HCC)   AKI (acute kidney injury) ([place])   Hypotension   Goals of care, counseling/discussion   Quadriplegia ([place])   Chronic kidney disease requiring chronic dialysis ([place])   Hyperglycemia   Sepsis due to Acinetobacter baumannii ([place])   Pneumonia due to Acinetobacter species ([place])   Dysautonomia ([place])   Chronic midline thoracic back pain   Abdominal distention   Consultants: PCCM Nephrology Infectious disease Palliative care medicine  Procedures: Echocardiogram Insertion of double-lumen HD catheter  Antibiotics: Cefepime x1 dose Meropenem 6/29 through 7/2 Vancomycin 6/29 through 6/30 Unasyn 7/2 through  7/15 Unasyn 8/15 >8/22   Time spent: 35 minutes    Erin Hearing ANP  [facility] 7 am - 330 pm/M-F for direct patient care and secure chat Please refer to Amion for contact info 71  days

## 2020-09-20 NOTE — Progress Notes (Signed)
NAME:  Luis Orr, MRN:  [id number], DOB:  05/30/1950, LOS: 52 ADMISSION DATE:  06/23/2020, CONSULTATION DATE:  06/22/2020 REFERRING MD:  Dr Francia Greaves, EDP CHIEF COMPLAINT:  septic shock    History of Present Illness:  70 yo male from [facility] with hx of C3-C5 compressive myelopathy with functional quadriplegia and chronic trach/vent presented to ED with altered mental status and hypotension with recurrent HCAP.  Has multiple recent admissions for sepsis secondary to various infections including UTIs, pneumonias and bacteremia in setting of MDR Klebsiella, MRSA and pseudomonas. Most recent admission September 2021 in setting of sepsis due to MDR Klebsiella with right ureteral calculus s/p double J ureteral stent placement and treated with meropenem x 10days.  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 Central line placed. Drainage from gastrostomy tube >> CT Abd without fluid collection around gastrostomy tube but with diffuse body wall edema. Echo EF 50-55%, mild LVH  7/6 Renal Replacement Therapy ; Code status changed to partial Code (DNR) 7/7 Transfuse 1U PRBC 7/9 add solu cortef 7/10 off pressors, weaning stress steroids  7/11 Pressors added back on again 7/12 ID consult due to rising WBC, hypothermia, hypotension concern for worsening infection. Vancomycin added. Repeat blood cx collected. 7/15 CRRT stopped   7/17 Restarted on low dose levo. No evidence of new infection. Held off on antibiotics. Nephrology contacted family and decision was made to continue iHD this week to allow additional time for renal recovery. Patient is not a long term iHD candidate  7/18 plans for iHD, tolerated with 2L removed  7/19 slight drop in hgb to 6.9 will transfuse 1 unit PRBC  7/20 To [facility], PCCM following for trach / vent needs 7/22>> tolerating HD, will need out of state placement 8/4 last PCCM visit. 8/4 -8/8: No issues. CM workin gon out of state placement  8/15 no acute changes. Continues on PRVC, was apneic with PSV attempt. Did wean to 40% FiO2  8/29 Oral bleeding from biting lip/tongue, subsequent bloody trach secretions  8/30 Left FA AVG occlueded 9/2 s/p thrombectomy and revision of LUE AVG 9/4 LUE with neg venous dupplex 9/5 heparin stopped, resumed Eliquis  9/6 iHD with 2L UF 9/7 changed from [facility] to PCV given high plat pressures  Interim History / Subjective:  No events Remains on PCV w/ TVs 500-534m RT reports some ongoing moderate thick white secretions Remains afebrile, WBC 23.8-> 18.5  Objective   Blood pressure (!) 160/78, pulse 88, temperature 99 F (37.2 C), temperature source Axillary, resp. rate 20, height '5\' 6"'$  (1.676 m), weight 79.2 kg, SpO2 98 %.    Vent Mode: PCV FiO2 (%):  [40 %] 40 % Set Rate:  [15 bmp-20 bmp] 20 bmp PEEP:  [5 cmH20] 5 cmH20 Plateau Pressure:  [16 cmH20-34 cmH20] 29 cmH20   Intake/Output Summary (Last 24 hours) at 09/20/2020 0953 Last data filed at 09/20/2020 0000 Gross per 24 hour  Intake 775 ml  Output 25 ml  Net 750 ml   Filed Weights   09/18/20 1402 09/19/20 0449 09/20/20 0500  Weight: 80 kg 79.1 kg 79.2 kg    Physical Exam: General:  chronically ill appearing elderly male lying in bed in NAD HEENT: MM pink/moist, pupils 5/reactive, midline trach 6 shiley XLT prox, R nare cortrak Neuro: open eyes, intermittently will follow some commands/  track CV: NSR, no murmur PULM:  non labored on PCV, coarse bilaterally - plats 28 on my exam, slightly higher PIP 30-32, minimal secretions/ recently suctioned by RT GI: soft, NT, +bs Extremities: warm/dry, edema in LUE noted, sutures dry/ intact Skin: no rashes  R TDC site wnl, left tunneled   Resolved Hospital Problem list   Hyperkalemia Tachycardia Hypotension Thrombocytosis  AKI Septic shock 2nd to HCAP and UTI all present on admission Acinetobacter in sputum culture from 07/12/20. Completed 14 days of Unasyn for -Of pressors on 4/14. Restarted  levo 7/17. Acute on chronic respiratory failure   Assessment & Plan:   C3-C5 compressive Myelopathy w/ resultant Chronic Respiratory failure, Vent and Trach dependence  Acinetobacter Baumannii PNA Not weaning.  Abx completed 8/22. - continue PCV given high plat's, monitor TVs closely to match his 8cc which is ~510 ml - stop scopolamine, continue mucinex and prn glycopyrrolate for secretions - VAP prevention protocol/ PPI - PAD protocol for sedation> minimize as able, prn oxy IR/ fentanyl  - intermittent CXR  - prn duonebs - continue trach care per protocol - volume removal per iHD (scheduled today)   - remainder per primary team/ TR[facility] - remains limited DNR (drugs and vent only) in the event of decompensation - pt continues to await placement, not able to return to [facility] given ESRD   PCCM will see twice weekly.  Please call if problems arise sooner.    Kennieth Rad, ACNP [facility] 09/20/2020, 9:53 AM  See Amion for pager If no response to pager, please call PCCM consult pager After 7:00 pm call Elink   Please see [email or web address] for pager details.   From 7A-7P if no response, please call [phone] After hours, please call ELink [phone]

## 2020-09-20 NOTE — TOC Progression Note (Signed)
Transition of Care [facility]) - Progression Note    Patient Details  Name: TADASHI BURKEL MRN: [id number] Date of Birth: Jul 21, 1950  Transition of Care [facility]) CM/SW [place], RN Phone Number: 09/20/2020, 12:39 PM  Clinical Narrative:    CM met with the patient's wife, June, yesterday and and gave her a signed physician's note to assist her with obtaining information from the social security office.  I spoke with MSW at [facility] and he is in need of other documents from the wife including bank statements, her driver's license, wife's social security card and insurance cards.  I explained to the MSW that the patient's wife will need assistance and reminders to bring documents and he plans to call and speak with her today to obtain these documents.  CM updated the [facility] that the patient has a current Left arm AVF and is only waiting on availability of the replacement PEG tube that is on back order for radiology.  CM and MSW will continue to follow the patient for Vent/SNf placement.   Expected Discharge Plan: [place] Barriers to Discharge: Continued Medical Work up, No SNF bed, Vent Bed not available  Expected Discharge Plan and Services Expected Discharge Plan: [place]   Discharge Planning Services: CM Consult Post Acute Care Choice: [place] Living arrangements for the past 2 months: Post-Acute Facility                                       Social Determinants of Health (SDOH) Interventions    Readmission Risk Interventions No flowsheet data found.

## 2020-09-20 NOTE — Progress Notes (Signed)
Nutrition Follow-up  DOCUMENTATION CODES:   Not applicable  INTERVENTION:   Continue tube feeds via Cortrak tube until G-tube is replaced: Nepro at 45 ml/hr (1080 ml/day) ProSource TF 45 ml TID  Provides 2064 kcal, 120 grams of protein, and 785 ml free water daily.  Continue renal MVI daily.  NUTRITION DIAGNOSIS:   Increased nutrient needs related to wound healing as evidenced by estimated needs.  Ongoing  GOAL:   Patient will meet greater than or equal to 90% of their needs  Met via TF  MONITOR:   Vent status, Labs, Weight trends, TF tolerance, Skin, I & O's  REASON FOR ASSESSMENT:   Ventilator, Consult Enteral/tube feeding initiation and management  ASSESSMENT:   70 year old male who presented to the ED on 6/29 from [facility] with AMS and hypotension. 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 Pt admitted with septic shock, acute on chronic hypoxemic respiratory failure.  Cortrak in place with tip postpyloric in the distal duodenum.  Patient is currently tolerating Nepro at 45 ml/h with Prosource TF 45 ml TID.   9/2 S/P redo LUE AV graft Currently receiving iHD on TTS schedule.  Last HD 9/6 with 2 L UF, for HD today.   Plans for D/C to SNF in [place] when medically stable and PEG is replaced (currently on back order).  Admit weight: 90.2 kg Current weight: 79.9 kg Lowest weight since admit: 72 kg on 8/3  Patient remains on ventilator support via trach MV: 10 L/min Temp (24hrs), Avg:98.7 F (37.1 C), Min:98.1 F (36.7 C), Max:99.2 F (37.3 C)  Medications reviewed and include: aranesp weekly, Novolog SSI q 4 hours, semglee 30 units daily, rena-vit, protonix.  Labs reviewed: Na 133 CBG: 113-123-111  Colostomy: 70 ml x 24 hours  Diet Order:   Diet Order             Diet NPO  time specified Except for: Sips with Meds  Diet effective midnight                   EDUCATION NEEDS:   No education needs have been identified at this time  Skin:  Skin Assessment: Skin Integrity Issues: DTI: L ear Stage IV: sacrum Stage II: R heel  Last BM:  9/8 colostomy  Height:   Ht Readings from Last 1 Encounters:  09/06/20 5' 6"  (1.676 m)    Weight:   Wt Readings from Last 1 Encounters:  09/20/20 79.9 kg    Ideal Body Weight:  58 kg (adjusted for quadriplegia)  BMI:  Body mass index is 28.43 kg/m.  Estimated Nutritional Needs:   Kcal:  1900-2100  Protein:  110-120 gm  Fluid:  1 L + UOP   Lucas Mallow, RD, LDN, CNSC Please refer to Amion for contact information.

## 2020-09-21 DIAGNOSIS — A419 Sepsis, unspecified organism: Secondary | ICD-10-CM | POA: Diagnosis not present

## 2020-09-21 DIAGNOSIS — Z9911 Dependence on respirator [ventilator] status: Secondary | ICD-10-CM | POA: Diagnosis not present

## 2020-09-21 DIAGNOSIS — A4159 Other Gram-negative sepsis: Secondary | ICD-10-CM | POA: Diagnosis not present

## 2020-09-21 DIAGNOSIS — G8250 Quadriplegia, unspecified: Secondary | ICD-10-CM | POA: Diagnosis not present

## 2020-09-21 LAB — GLUCOSE, CAPILLARY
Glucose-Capillary: 130 mg/dL — ABNORMAL HIGH (ref 70–99)
Glucose-Capillary: 153 mg/dL — ABNORMAL HIGH (ref 70–99)
Glucose-Capillary: 153 mg/dL — ABNORMAL HIGH (ref 70–99)
Glucose-Capillary: 143 mg/dL — ABNORMAL HIGH (ref 70–99)
Glucose-Capillary: 125 mg/dL — ABNORMAL HIGH (ref 70–99)

## 2020-09-21 MED ORDER — CHLORHEXIDINE GLUCONATE CLOTH 2 % EX PADS
6.0000 | MEDICATED_PAD | Freq: Every day | CUTANEOUS | Status: DC
  Administered 2020-09-21 – 2020-09-30 (×7): 6 via TOPICAL

## 2020-09-21 NOTE — Progress Notes (Signed)
Message left for admissions at [facility] as to what dialysis information may be needed and timeframe documentation may be needed. Will assist as needed.   Melven Sartorius Renal Navigator [phone]

## 2020-09-21 NOTE — Progress Notes (Signed)
[facility] PROGRESS NOTE  Luis Orr GNF:[id number] DOB: 1950-07-04 DOA: 06/13/2020 PCP: Townsend Roger, MD  Status: Remains inpatient appropriate because:Hemodynamically unstable, Persistent severe electrolyte disturbances, Unsafe d/c plan, and Inpatient level of care appropriate due to severity of illness  Dispo: The patient is from: SNF[facility] vent capable              Anticipated d/c is to: SNF Vent capable w/ access to in facility/stretcher based HD-a facility in [place] has been located in preauthorization in progress.              Patient currently is not medically stable to d/c.   Difficult to place patient Yes    Level of care: ICU  Code Status: Partial (no CPR and no defibrillation or cardioversion) Family Communication: Wife June on 8/22.  Of note the wife is also the POA DVT prophylaxis: IV Heparin COVID vaccination status: Unknown    HPI:  70 y.o. 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 Patient presented secondary to altered mental status and hypotension with recurrent pneumonia requiring vasopressor support and ICU admission.he has had multiple recent admissions for sepsis secondary to various infections including UTIs, pneumonias and bacteremia in setting of MDR Klebsiella, MRSA and pseudomonas. Most recent admission September 2021 in setting of sepsis due to MDR Klebsiella with right ureteral calculus s/p double J ureteral stent placement and treated with meropenem x 10days. He was discharged back to [facility].  ICU significant events: 6/29 admitted to ICU for septic shock on levo to maintain MAP>65, continued on vent support 6/29 Blood Cx>> staph species in 1 of 4 cultures drawn>> suspect contaminant 6/30 Off pressors 7/2 ID Consult for MDR acinetobacter in trach asp, Meropenem changed to unasyn 7/2 PRBC transfusion, iron replacement 7/5 Pressors added again. Central line placed.  Drainage from gastrostomy tube >> CT Abd without fluid collection around gastrostomy tube but with diffuse body wall edema. Echo EF 50-55%, mild LVH 7/6 Renal Replacement Therapy ; Code status changed to partial Code (DNR) 7/7 Transfuse 1U PRBC 7/9 add solu cortef 7/10 off pressors, weaning stress steroids 7/11 Pressors added back on again 7/12 ID consult due to rising WBC, hypothermia, hypotension concern for worsening infection. Vancomycin added. Repeat blood cx collected. 7/15 CRRT stopped 7/17 Restarted on low dose levo. No evidence of new infection. Held off on antibiotics. Nephrology contacted family and decision was made to continue iHD this week to allow additional time for renal recovery. Patient is not the best long term iHD candidate but family wants aggressive care 7/18 plans for iHD, tolerated with 2L removed 719 slight drop in hgb to 6.9 will transfuse 1 unit PRBC 7/22 > 7/26: Tolerating HD. TOC consulted for placement. Will likely need out of state placement. 8/15 WBCs greater than 28,000, hypoglycemia, procalcitonin >18, chest x-ray consistent with pneumonia, recent blood cultures consistent with contamination.  Unasyn IV resumed after sputum culture obtained 8/16 consultation placed with ethics committee regarding evaluation for futility of care 8/16 Sputum cx + for Acinetobacter with sensitivities pending 8/17 extensive conversation held with patient's wife regarding patient's poor prognosis and reemergence of Acinetobacter VAP.  Also discussed with her that patient has been able to convey to multiple staff that he does not want to continue on the ventilator or with dialysis.  Wife reports that the patient does not understand what he is saying, she is the POA and that she wants to continue aggressive care. 8/18 patient  now telling nephrology, myself and the rest of the staff he replies yes to wanting to remain on dialysis and on the ventilator and this direct question is  asked. 8/18 Acinetobacter sensitive to Unasyn only.  Discussed with ID/Dr. Juleen China.  Plan is to administer antibiotics for 7-day since patient has not had any increased O2 needs and remains afebrile.  If any concerns over worsening symptoms can give an additional 3 days. Developed moderate bleeding around tracheostomy site on 8/19.  Likely related to combination of Eliquis and heparin required for CRRT.  We will hold Eliquis for now and monitor degree of bleeding Eliquis resumed on 8/22.  Overnight into 8/23 patient bit his tongue and had bleeding.  Also had some bleeding around trach likely from oral bleeding.  Also though did have bleeding from old central line site. 8/23 had transient bleeding around trach and prior central line insertion site overnight.  Patient had bitten tongue and this likely explain the bleeding.  This morning patient was not having any bleeding.  On 8/25 had redeveloped slow ooze bleeding from mouth and trach.  Eliquis discontinued in favor of IV heparin especially given patient will undergo surgical procedure for vascular access on 8/29 8/30 Left FA AVG occluded 9/2 status post thrombectomy and revision of LUE AV graft 9/4 LUE edema with negative venous duplex. 9/5 heparin discontinued and Eliquis resumed    Subjective: Patient avoiding eye contact.  Briefly made eye contact when I discussed the video visit from his wife yesterday.  He appears to be very depressed and he has been crying.  Objective: Vitals:   09/21/20 0500 09/21/20 0600  BP: (!) 108/59 126/75  Pulse: 89 88  Resp: 20 20  Temp:    SpO2: 99% 100%    Intake/Output Summary (Last 24 hours) at 09/21/2020 0728 Last data filed at 09/21/2020 0600 Gross per 24 hour  Intake 990 ml  Output 2254 ml  Net -1264 ml   Filed Weights   09/20/20 1230 09/20/20 1545 09/21/20 0500  Weight: 79.9 kg 77.8 kg 77.8 kg    Exam:  Constitutional: Awake, and has very flat affect and appears very depressed noting he is  crying.  Denies pain. Respiratory: Trach: 6.0 XLT cuffed, Vent settings: PRVC mode, Fio2 40%, rate 20, VT 510, PEEP 5; lungs remain coarse bilaterally although more clear on the left with expiratory rhonchi RML and RLL. Cardiovascular: SR, normotensive, no bilateral lower extremity edema.  Stable LUE edema.  Erythema directly over AVG site-continues with palpable thrill Abdomen: LBM 9/08, PEG tube clogged.  Cortrack in place for feedings; PEG tube in place - occluded.  Left lower quadrant colostomy in place, abdomen distended but nontender with normoactive bowel sounds.  Neurologic: Chronic quadriplegia so unable to independently move extremities.  No sensation below shoulders. Psychiatric: Awake avoiding eye contact, has flat affect, has been crying and appears very depressed.   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 Acute sx's secondary to pneumonia/volume overload - redeveloped Acinetobacter pneumonia (8/14) and completed 7 days of Unasyn Ventilator management per PCCM  Recurrent ventilator associated pneumonia 2/2 recurrent Acinetobacter/sepsis without shock WBCs stable with positive sputum culture sensitive only to Unasyn ID/Dr Juleen China recommended 7 days of therapy -completed on 8/22   AKI with oliguria/volume overload/ New CKD requiring HD Anuric Nephrology following, initially required CRRT and has transitioned to HD (due to staffing issues continues on CRRT at bedside) LUE AVG functional   Septic  shock 2/2 VAP POA Presented with sepsis physiology and profound shock which has now resolved  Chronic thoracic back pain Continue Oxy and fentanyl prn; Skelaxin and lidocaine patch  History of paroxysmal atrial fibrillation Patient was on amiodarone prior to admission -maintaining SR AVG functional heparin discontinued on 9 5 and Eliquis resumed  TSH was WNL this admission QTc 420 ms on 3/01    Acute metabolic encephalopathy Resolved Secondary to acute illness. CT head negative for acute process.  Back at baseline.   Diabetes mellitus, type 2 Continue Semglee 30 units daily.  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  Resolved.  Quadriplegia secondary to compressive myelopathy.  [facility] will not accept him back due to HD dependent and inability to sit up in chair for dialysis treatment   Microcytic anemia/Acute anemia superimposed on anemia of chronic kidney disease S/P 5 units of PRBC  Continue Aranesp and follow Hgb prn   Severe protein calorie malnutrition PEG tube unable to be declogged successfully.  Feeding tube on backorder.   Continue Cortrak for feedings and meds.  Pressure injury/stage IV sacral decubitus Stage IV medial/posterior sacrum present on admission.  Right/posterior/lateral ankle unknown if present on admission.  DTI left ear, not present on admission. (For additional documentation see wound care notes)    Data Reviewed: Basic Metabolic Panel: Recent Labs  Lab 09/15/20 0635 09/18/20 1106 09/19/20 0017 09/19/20 0500 09/19/20 1607 09/20/20 0834  NA 133* 130* 136 133* 136 133*  K 3.7 3.3* 3.9 3.6 3.9 3.7  CL 98 95*  --  96*  --  95*  CO2 24 24  --  25  --  24  GLUCOSE 200* 119*  --  136*  --  133*  BUN 53* 66*  --  51*  --  79*  CREATININE 1.22 1.16  --  0.98  --  1.32*  CALCIUM 9.0 9.5  --  9.4  --  9.4  MG  --   --   --  2.2  --   --   PHOS 5.2* 3.8  --   --   --  4.5   Liver Function Tests: Recent Labs  Lab 09/15/20 0635 09/18/20 1106 09/20/20 0834  ALBUMIN 1.6* 1.8* 1.6*    CBC: Recent Labs  Lab 09/16/20 0500 09/17/20 0457 09/18/20 0328 09/19/20 0017 09/19/20 0500 09/19/20 1607  09/20/20 0335  WBC 21.7* 20.0* 22.5*  --  23.8*  --  18.5*  HGB 7.2* 7.0* 7.6* 9.5* 8.3* 9.9* 7.6*  HCT 23.1* 22.1* 23.8* 28.0* 26.2* 29.0* 23.4*  MCV 81.9 80.7 81.0  --  81.6  --  80.4  PLT 442* 381 361  --  398  --  388   CBG: Recent Labs  Lab 09/20/20 1103 09/20/20 1521 09/20/20 1949 09/20/20 2311 09/21/20 0304  GLUCAP 111* 119* 145* 133* 130*       Studies: DG CHEST PORT 1 VIEW  Result Date: 09/19/2020 CLINICAL DATA:  Chronic respiratory failure with hypoxia (HCC) J96.11 (ICD-10-CM) EXAM: PORTABLE CHEST 1 VIEW COMPARISON:  09/01/2020. FINDINGS: Similar bibasilar consolidation and/or atelectasis. Possible small layering bilateral pleural effusions. No visible pneumothorax on this single semi erect radiograph. Similar cardiomediastinal silhouette, accentuated by AP technique. Similar position of a dual lumen right IJ central venous catheter with the  tip projecting at the superior cavoatrial junction. Similar position of a left IJ central venous catheter with the tip projecting at the lower SVC. Tracheostomy tube with the tip at the level of the clavicular heads. Enteric tube courses below the diaphragm with the tip outside the field of view. IMPRESSION: 1. Similar bibasilar consolidation and/or atelectasis. 2. Possible small layering bilateral pleural effusions. Electronically Signed   By: Margaretha Sheffield M.D.   On: 09/19/2020 11:26    Scheduled Meds:  amiodarone  200 mg Per Tube Daily   apixaban  5 mg Per Tube BID   buPROPion  75 mg Per Tube BID   chlorhexidine gluconate (MEDLINE KIT)  15 mL Mouth Rinse BID   Chlorhexidine Gluconate Cloth  6 each Topical Daily   Chlorhexidine Gluconate Cloth  6 each Topical Q0600   Chlorhexidine Gluconate Cloth  6 each Topical Q0600   darbepoetin (ARANESP) injection - DIALYSIS  100 mcg Intravenous Q Tue-HD   feeding supplement (PROSource TF)  45 mL Per Tube TID   guaiFENesin  5 mL Per Tube BID   insulin aspart  0-9 Units Subcutaneous Q4H    insulin glargine-yfgn  30 Units Subcutaneous Daily   lidocaine  1 patch Transdermal Q24H   mouth rinse  15 mL Mouth Rinse 10 times per day   metaxalone  800 mg Per Tube TID   midodrine  5 mg Per Tube TID   multivitamin  1 tablet Per Tube QHS   pantoprazole sodium  40 mg Per Tube QHS   sertraline  100 mg Per Tube Daily   simethicone  40 mg Per Tube QID   sodium chloride flush  10-40 mL Intracatheter Q12H   Continuous Infusions:  sodium chloride 10 mL/hr at 09/29/2020 1429   sodium chloride 500 mL (09/01/20 1408)   sodium chloride     sodium chloride     sodium chloride     sodium chloride     feeding supplement (NEPRO CARB STEADY) 1,000 mL (09/20/20 2126)    Active Problems:   Ventilator dependent (HCC)   Septic shock (HCC)   Pressure injury of skin   Acute respiratory failure with hypoxia (HCC)   AKI (acute kidney injury) ([place])   Hypotension   Goals of care, counseling/discussion   Quadriplegia ([place])   Chronic kidney disease requiring chronic dialysis ([place])   Hyperglycemia   Sepsis due to Acinetobacter baumannii ([place])   Pneumonia due to Acinetobacter species ([place])   Dysautonomia ([place])   Chronic midline thoracic back pain   Abdominal distention   Consultants: PCCM Nephrology Infectious disease Palliative care medicine  Procedures: Echocardiogram Insertion of double-lumen HD catheter  Antibiotics: Cefepime x1 dose Meropenem 6/29 through 7/2 Vancomycin 6/29 through 6/30 Unasyn 7/2 through 7/15 Unasyn 8/15 >8/22   Time spent: 35 minutes    Erin Hearing ANP  [facility] 7 am - 330 pm/M-F for direct patient care and secure chat Please refer to Amion for contact info 72  days

## 2020-09-21 NOTE — TOC Progression Note (Signed)
Transition of Care [facility]) - Progression Note    Patient Details  Name: Luis Orr MRN: [id number] Date of Birth: August 10, 1950  Transition [facility]) CM/SW [place], RN Phone Number: 09/21/2020, 8:43 AM  Clinical Narrative:    Case management spoke with the patient's wife, June, this morning and she states that she sent documents to the MSW at [facility] in [place] yesterday via email.  I explained to the wife that the facility would need all documents sent her her on a checklist to assist with qualification for [place] Medicaid.  She was asked to send copies of all bank statements, driver's license, insurance cards and social security card for herself to the facility as well.  I asked that she speak with her bank to have a recent statement sent to the facility to confirm her financial need.  The patient's wife states that she understood and would continue to send needed documents to the facility.  CM and MSW will continue to follow the patient for SNF placement at [facility] in [place].   Expected Discharge Plan: [place] Barriers to Discharge: Continued Medical Work up, No SNF bed, Vent Bed not available  Expected Discharge Plan and Services Expected Discharge Plan: [place]   Discharge Planning Services: CM Consult Post Acute Care Choice: [place] Living arrangements for the past 2 months: Post-Acute Facility                                       Social Determinants of Health (SDOH) Interventions    Readmission Risk Interventions No flowsheet data found.

## 2020-09-21 NOTE — Progress Notes (Signed)
[facility] NEPHROLOGY PROGRESS NOTE  Assessment/ Plan:  # Acute kidney Injury-prolonged dialysis dependency, now progressed to ESRD:  ATN from septic shock-without any evidence of renal recovery to date . initiated on CRRT 07/18/20 and transitioned to IHD.  Has TDC, continue with HD on TTS schedule for now.  Status post HD on 9/8 with 2 L ultrafiltration.  Plan for next hemodialysis tomorrow.  # Vascular access - had left forearm AVG placed 08/28/2020 but unfortunately had clotted.  s/p thrombectomy and revision 9/2, graft can be used 4 weeks thereafter.  Swelling of left upper extremity noted continue arm elevation and supportive care.  # Septic shock/ventilator associated pneumonia due to recurrent Acinetobacter infection: Off antibiotics and stable hemodynamics.  #  Chronic respiratory failure: Status post tracheostomy with ventilator management per CCM.  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  # Chronic kidney disease/metabolic bone disease: Calcium and phosphorus level under control with hemodialysis, off binders  # Anemia: contiue ESA, monitor Hb.  Transfuse as needed.  # Acute metabolic encephalopathy: Continue management as above.,  Supportive care.  # Disposition - pt cannot return to [facility] now that he is ESRD. He is going to [facility] in [place], [place]. He needs to have [place] Medicaid & his AVG needs to be mature prior to being accepted. Other possible barrier is pt is a permanent trach & vent pt.     Subjective: Seen and examined in ICU.  Remains anuric.  No new event.  Tolerated dialysis well.  Objective Vital signs in last 24 hours: Vitals:   09/21/20 0500 09/21/20 0600 09/21/20 0751 09/21/20 0830  BP: (!) 108/59 126/75    Pulse: 89 88    Resp: 20 20    Temp:   98.5 F (36.9 C)   TempSrc:   Oral   SpO2: 99% 100%  100%  Weight: 77.8 kg     Height:       Weight change: 0.7 kg  Intake/Output Summary (Last 24  hours) at 09/21/2020 0915 Last data filed at 09/21/2020 0600 Gross per 24 hour  Intake 900 ml  Output 2254 ml  Net -1354 ml        Labs: Basic Metabolic Panel: Recent Labs  Lab 09/15/20 0635 09/18/20 1106 09/19/20 0017 09/19/20 0500 09/19/20 1607 09/20/20 0834  NA 133* 130*   < > 133* 136 133*  K 3.7 3.3*   < > 3.6 3.9 3.7  CL 98 95*  --  96*  --  95*  CO2 24 24  --  25  --  24  GLUCOSE 200* 119*  --  136*  --  133*  BUN 53* 66*  --  51*  --  79*  CREATININE 1.22 1.16  --  0.98  --  1.32*  CALCIUM 9.0 9.5  --  9.4  --  9.4  PHOS 5.2* 3.8  --   --   --  4.5   < > = values in this interval not displayed.    Liver Function Tests: Recent Labs  Lab 09/15/20 0635 09/18/20 1106 09/20/20 0834  ALBUMIN 1.6* 1.8* 1.6*    No results for input(s): LIPASE, AMYLASE in the last 168 hours. No results for input(s): AMMONIA in the last 168 hours. CBC: Recent Labs  Lab 09/16/20 0500 09/17/20 0457 09/18/20 0328 09/19/20 0017 09/19/20 0500 09/19/20 1607 09/20/20 0335  WBC 21.7* 20.0* 22.5*  --  23.8*  --  18.5*  HGB 7.2* 7.0*  7.6*   < > 8.3* 9.9* 7.6*  HCT 23.1* 22.1* 23.8*   < > 26.2* 29.0* 23.4*  MCV 81.9 80.7 81.0  --  81.6  --  80.4  PLT 442* 381 361  --  398  --  388   < > = values in this interval not displayed.    Cardiac Enzymes: No results for input(s): CKTOTAL, CKMB, CKMBINDEX, TROPONINI in the last 168 hours. CBG: Recent Labs  Lab 09/20/20 1521 09/20/20 1949 09/20/20 2311 09/21/20 0304 09/21/20 0749  GLUCAP 119* 145* 133* 130* 153*     Iron Studies: No results for input(s): IRON, TIBC, TRANSFERRIN, FERRITIN in the last 72 hours.  Studies/Results: No results found.  Medications: Infusions:  sodium chloride 10 mL/hr at 09/23/2020 1429   sodium chloride 500 mL (09/01/20 1408)   sodium chloride     sodium chloride     sodium chloride     sodium chloride     feeding supplement (NEPRO CARB STEADY) 1,000 mL (09/20/20 2126)    Scheduled  Medications:  amiodarone  200 mg Per Tube Daily   apixaban  5 mg Per Tube BID   buPROPion  75 mg Per Tube BID   chlorhexidine gluconate (MEDLINE KIT)  15 mL Mouth Rinse BID   Chlorhexidine Gluconate Cloth  6 each Topical Daily   Chlorhexidine Gluconate Cloth  6 each Topical Q0600   Chlorhexidine Gluconate Cloth  6 each Topical Q0600   darbepoetin (ARANESP) injection - DIALYSIS  100 mcg Intravenous Q Tue-HD   feeding supplement (PROSource TF)  45 mL Per Tube TID   guaiFENesin  5 mL Per Tube BID   insulin aspart  0-9 Units Subcutaneous Q4H   insulin glargine-yfgn  30 Units Subcutaneous Daily   lidocaine  1 patch Transdermal Q24H   mouth rinse  15 mL Mouth Rinse 10 times per day   metaxalone  800 mg Per Tube TID   midodrine  5 mg Per Tube TID   multivitamin  1 tablet Per Tube QHS   pantoprazole sodium  40 mg Per Tube QHS   sertraline  100 mg Per Tube Daily   simethicone  40 mg Per Tube QID   sodium chloride flush  10-40 mL Intracatheter Q12H    have reviewed scheduled and prn medications.  Physical Exam:  Exam similar from yesterday. General:NAD, tracheostomy with secretions, able to open, on vent Heart:RRR, s1s2 nl Lungs: Coarse breath sound bilateral, no increased work of breathing. Abdomen:soft, Non-tender, PEG tube and ostomy bag Extremities:Trace lower extremities edema Dialysis Access: LIJ TDC, LUE AVG T/B+, swelling of the left upper extremity noted.  Luis Orr 09/21/2020,9:15 AM  LOS: 72 days

## 2020-09-22 DIAGNOSIS — R4182 Altered mental status, unspecified: Secondary | ICD-10-CM | POA: Diagnosis not present

## 2020-09-22 LAB — GLUCOSE, CAPILLARY
Glucose-Capillary: 124 mg/dL — ABNORMAL HIGH (ref 70–99)
Glucose-Capillary: 157 mg/dL — ABNORMAL HIGH (ref 70–99)
Glucose-Capillary: 159 mg/dL — ABNORMAL HIGH (ref 70–99)
Glucose-Capillary: 164 mg/dL — ABNORMAL HIGH (ref 70–99)
Glucose-Capillary: 168 mg/dL — ABNORMAL HIGH (ref 70–99)
Glucose-Capillary: 171 mg/dL — ABNORMAL HIGH (ref 70–99)
Glucose-Capillary: 200 mg/dL — ABNORMAL HIGH (ref 70–99)

## 2020-09-22 MED ORDER — LIDOCAINE HCL (PF) 1 % IJ SOLN: 5.0000 mL | INTRAMUSCULAR | Status: DC | PRN

## 2020-09-22 MED ORDER — SODIUM CHLORIDE 0.9 % IV SOLN: 100.0000 mL | INTRAVENOUS | Status: DC | PRN

## 2020-09-22 MED ORDER — ALTEPLASE 2 MG IJ SOLR: 2.0000 mg | Freq: Once | INTRAMUSCULAR | Status: DC | PRN

## 2020-09-22 MED ORDER — PENTAFLUOROPROP-TETRAFLUOROETH EX AERO: 1.0000 "application " | INHALATION_SPRAY | CUTANEOUS | Status: DC | PRN

## 2020-09-22 MED ORDER — HEPARIN SODIUM (PORCINE) 1000 UNIT/ML DIALYSIS: 1000.0000 [IU] | INTRAMUSCULAR | Status: DC | PRN

## 2020-09-22 MED ORDER — LIDOCAINE-PRILOCAINE 2.5-2.5 % EX CREA: 1.0000 "application " | TOPICAL_CREAM | CUTANEOUS | Status: DC | PRN

## 2020-09-22 NOTE — Progress Notes (Signed)
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  Patient presented secondary to altered mental status and hypotension with recurrent pneumonia requiring vasopressor support and ICU admission.  He has had multiple recent admission for sepsis secondary to various infectious including UTI, pneumonia and bacteremia in the setting of MDR Klebsiella, MRSA and Pseudomonas.  Most recent admission September 2021 in setting of sepsis due to MDR Klebsiella with right ureteral calculus status post double JJ ureteral stent placement and treated with meropenem for 10 days.  He was discharged back to [facility].  He was admitted 6/29 to the ICU with septic shock on Levophed.  Trach asp gross MDR Acinetobacter treated with meropenem.  Patient progress  to renal failure requiring renal replacement therapy.  Patient with prolonged hospital course.  See below.    ICU significant events: 6/29 admitted to ICU for septic shock on levo to maintain MAP>65, continued on vent support 6/29 Blood Cx>> staph species in 1 of 4 cultures drawn>> suspect contaminant 6/30 Off pressors 7/2 ID Consult for MDR acinetobacter in trach asp, Meropenem changed to unasyn 7/2 PRBC transfusion, iron replacement 7/5 Pressors added again. Central line placed. Drainage from gastrostomy tube >> CT Abd without fluid collection around gastrostomy tube but with diffuse body wall edema. Echo EF 50-55%, mild LVH 7/6 Renal Replacement Therapy ; Code status changed to partial Code (DNR) 7/7 Transfuse 1U PRBC 7/9 add solu cortef 7/10 off pressors, weaning stress steroids 7/11 Pressors added back on again 7/12 ID consult due to rising WBC, hypothermia, hypotension concern for worsening infection. Vancomycin added. Repeat blood cx  collected. 7/15 CRRT stopped 7/17 Restarted on low dose levo. No evidence of new infection. Held off on antibiotics. Nephrology contacted family and decision was made to continue iHD this week to allow additional time for renal recovery. Patient is not the best long term iHD candidate but family wants aggressive care 7/18 plans for iHD, tolerated with 2L removed 719 slight drop in hgb to 6.9 will transfuse 1 unit PRBC 7/22 > 7/26: Tolerating HD. TOC consulted for placement. Will likely need out of state placement. 8/15 WBCs greater than 28,000, hypoglycemia, procalcitonin >18, chest x-ray consistent with pneumonia, recent blood cultures consistent with contamination.  Unasyn IV resumed after sputum culture obtained 8/16 consultation placed with ethics committee regarding evaluation for futility of care 8/16 Sputum cx + for Acinetobacter with sensitivities pending 8/17 extensive conversation held with patient's wife regarding patient's poor prognosis and reemergence of Acinetobacter VAP.  Also discussed with her that patient has been able to convey to multiple staff that he does not want to continue on the ventilator or with dialysis.  Wife reports that the patient does not understand what he is saying, she is the POA and that she wants to continue aggressive care. 8/18 patient now telling nephrology, myself and the rest of the staff he replies yes to wanting to remain on dialysis and on the ventilator and this direct question is asked. 8/18 Acinetobacter sensitive to Unasyn only.  Discussed with ID/Dr. Juleen China.  Plan is to administer antibiotics for 7-day since patient has not had any increased O2 needs and remains afebrile.  If any concerns over worsening symptoms can give an additional 3 days. Developed moderate bleeding around tracheostomy site on 8/19.  Likely related to combination of Eliquis  and heparin required for CRRT.  We will hold Eliquis for now and monitor degree of bleeding Eliquis resumed  on 8/22.  Overnight into 8/23 patient bit his tongue and had bleeding.  Also had some bleeding around trach likely from oral bleeding.  Also though did have bleeding from old central line site. 8/23 had transient bleeding around trach and prior central line insertion site overnight.  Patient had bitten tongue and this likely explain the bleeding.  This morning patient was not having any bleeding.  On 8/25 had redeveloped slow ooze bleeding from mouth and trach.  Eliquis discontinued in favor of IV heparin especially given patient will undergo surgical procedure for vascular access on 8/29 8/30 Left FA AVG occluded 9/2 status post thrombectomy and revision of LUE AV graft 9/4 LUE edema with negative venous duplex. 9/5 heparin discontinued and Eliquis resumed    Assessment & Plan:   Active Problems:   Ventilator dependent (HCC)   Septic shock (HCC)   Pressure injury of skin   Acute respiratory failure with hypoxia (HCC)   AKI (acute kidney injury) (HCC)   Hypotension   Goals of care, counseling/discussion   Quadriplegia (HCC)   Chronic kidney disease requiring chronic dialysis ([place])   Hyperglycemia   Sepsis due to Acinetobacter baumannii ([place])   Pneumonia due to Acinetobacter species ([place])   Dysautonomia (HCC)   Chronic midline thoracic back pain   Abdominal distention   1-Acute on chronic respiratory failure with hypoxia and hypercapnia: Chronic Vent Dependent.  Treated for PNA..  Stable.  Continue with suctioning.   Recurrent ventilator associated pneumonia secondary to recurrent see no bacteremia/sepsis without shock -Completed antibiotics treatment.   AKI with oliguria/volume overload/new CKD requiring hemodialysis Continue with HD.  -Needs AVG mature to be able to transfer to [place].   Septic shock secondary to VAP POA -Resolved.   Chronic thoracic back pain: -Continue with pain management.   History of paroxysmal A. fib: On amiodarone and eliquis.   Acute  metabolic encephalopathy Secondary to acute illness.   Diabetes type 2: Continue with Semglee and SSI.   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   Shock  liver: Resolved.   Quadriplegia  secondary to compressive myelopathy -support care.  -poor prognosis.   Macrocytic anemia: -S/p 5 units of packed red blood cell.  Severe protein caloric malnutrition: Continue with tube feeding PEG tube unable to be declogged successfully.  Feeding tube on backorder.    Pressure injury/stage IV sacral decubitus: Stage IV medial/posterior sacrum present on admission.  Right/posterior/lateral ankle unknown if present on admission.  DTI left ear, not present on admission. (For additional documentation see wound care notes)      Pressure Injury 06/27/2020 Sacrum Posterior;Medial Stage 4 - Full thickness tissue loss with exposed bone, tendon or muscle. (Active)  06/30/2020 2240  Location: Sacrum  Location Orientation: Posterior;Medial  Staging: Stage 4 - Full thickness tissue loss with exposed bone, tendon or muscle.  Wound Description (Comments):   Present on Admission: Yes     Pressure Injury 07/23/20 Ear Left Deep Tissue Pressure Injury - Purple or maroon localized area of discolored intact skin or blood-filled blister due to damage of underlying soft tissue from pressure and/or shear. DTI left ear (Active)  07/23/20 2000  Location: Ear  Location Orientation: Left  Staging: Deep Tissue Pressure Injury - Purple or maroon localized area of discolored intact skin or blood-filled blister due to damage of underlying soft tissue from  pressure and/or shear.  Wound Description (Comments): DTI left ear  Present on Admission: No     Pressure Injury 09/03/20 Heel Right Stage 2 -  Partial thickness loss of dermis presenting as a shallow open injury with a red, pink wound bed without slough. (Active)  09/03/20 0800  Location: Heel   Location Orientation: Right  Staging: Stage 2 -  Partial thickness loss of dermis presenting as a shallow open injury with a red, pink wound bed without slough.  Wound Description (Comments):   Present on Admission:      Nutrition Problem: Increased nutrient needs Etiology: wound healing    Signs/Symptoms: estimated needs    Interventions: Tube feeding, Prostat, MVI  Estimated body mass index is 29.14 kg/m as calculated from the following:   Height as of this encounter: 5' 6"  (1.676 m).   Weight as of this encounter: 81.9 kg.   DVT prophylaxis: eliquis Code Status: partial  Family Communication: Disposition Plan:  Status is: Inpatient  Remains inpatient appropriate because:Ongoing active pain requiring inpatient pain management  Dispo: The patient is from: SNF              Anticipated d/c is to: SNF              Patient currently is medically stable to d/c.   Difficult to place patient No        Consultants:  CCM   Subjective: He is alert  Objective: Vitals:   09/22/20 0300 09/22/20 0334 09/22/20 0400 09/22/20 0500  BP: 137/79  133/80 140/73  Pulse: 96  99 (!) 101  Resp: 20  20 18   Temp:      TempSrc:      SpO2: 96% 96% 97% 96%  Weight:    81.9 kg  Height:        Intake/Output Summary (Last 24 hours) at 09/22/2020 0223 Last data filed at 09/22/2020 0600 Gross per 24 hour  Intake 1035 ml  Output 175 ml  Net 860 ml   Filed Weights   09/20/20 1545 09/21/20 0500 09/22/20 0500  Weight: 77.8 kg 77.8 kg 81.9 kg    Examination:  General exam: alert, trach Respiratory system: Clear to auscultation. Respiratory effort normal. Cardiovascular system: S1 & S2 heard, RRR Gastrointestinal system: Abdomen is nondistended, soft and nontender. No organomegaly or masses felt. Normal bowel sounds heard. Central nervous system: Alert quadriplegic.  Extremities: no edema   Data Reviewed: I have personally reviewed following labs and imaging  studies  CBC: Recent Labs  Lab 09/16/20 0500 09/17/20 0457 09/18/20 0328 09/19/20 0017 09/19/20 0500 09/19/20 1607 09/20/20 0335  WBC 21.7* 20.0* 22.5*  --  23.8*  --  18.5*  HGB 7.2* 7.0* 7.6* 9.5* 8.3* 9.9* 7.6*  HCT 23.1* 22.1* 23.8* 28.0* 26.2* 29.0* 23.4*  MCV 81.9 80.7 81.0  --  81.6  --  80.4  PLT 442* 381 361  --  398  --  361   Basic Metabolic Panel: Recent Labs  Lab 09/18/20 1106 09/19/20 0017 09/19/20 0500 09/19/20 1607 09/20/20 0834  NA 130* 136 133* 136 133*  K 3.3* 3.9 3.6 3.9 3.7  CL 95*  --  96*  --  95*  CO2 24  --  25  --  24  GLUCOSE 119*  --  136*  --  133*  BUN 66*  --  51*  --  79*  CREATININE 1.16  --  0.98  --  1.32*  CALCIUM 9.5  --  9.4  --  9.4  MG  --   --  2.2  --   --   PHOS 3.8  --   --   --  4.5   GFR: Estimated Creatinine Clearance: 52.3 mL/min (A) (by C-G formula based on SCr of 1.32 mg/dL (H)). Liver Function Tests: Recent Labs  Lab 09/18/20 1106 09/20/20 0834  ALBUMIN 1.8* 1.6*   No results for input(s): LIPASE, AMYLASE in the last 168 hours. No results for input(s): AMMONIA in the last 168 hours. Coagulation Profile: No results for input(s): INR, PROTIME in the last 168 hours. Cardiac Enzymes: No results for input(s): CKTOTAL, CKMB, CKMBINDEX, TROPONINI in the last 168 hours. BNP (last 3 results) No results for input(s): PROBNP in the last 8760 hours. HbA1C: No results for input(s): HGBA1C in the last 72 hours. CBG: Recent Labs  Lab 09/21/20 1103 09/21/20 1604 09/21/20 2046 09/22/20 0044 09/22/20 0506  GLUCAP 153* 143* 125* 168* 157*   Lipid Profile: No results for input(s): CHOL, HDL, LDLCALC, TRIG, CHOLHDL, LDLDIRECT in the last 72 hours. Thyroid Function Tests: No results for input(s): TSH, T4TOTAL, FREET4, T3FREE, THYROIDAB in the last 72 hours. Anemia Panel: No results for input(s): VITAMINB12, FOLATE, FERRITIN, TIBC, IRON, RETICCTPCT in the last 72 hours. Sepsis Labs: Recent Labs  Lab 09/18/20 0925   LATICACIDVEN 1.0    Recent Results (from the past 240 hour(s))  Surgical PCR screen     Status: None   Collection Time: 09/23/2020  9:25 AM   Specimen: Nasal Mucosa; Nasal Swab  Result Value Ref Range Status   MRSA, PCR NEGATIVE NEGATIVE Final   Staphylococcus aureus NEGATIVE NEGATIVE Final    Comment: (NOTE) The Xpert SA Assay (FDA approved for NASAL specimens in patients 25 years of age and older), is one component of a comprehensive surveillance program. It is not intended to diagnose infection nor to guide or monitor treatment. Performed at [facility], [place] [address] [place], [place] [address]          Radiology Studies: No results found.      Scheduled Meds:  amiodarone  200 mg Per Tube Daily   apixaban  5 mg Per Tube BID   buPROPion  75 mg Per Tube BID   chlorhexidine gluconate (MEDLINE KIT)  15 mL Mouth Rinse BID   Chlorhexidine Gluconate Cloth  6 each Topical Daily   Chlorhexidine Gluconate Cloth  6 each Topical Q0600   darbepoetin (ARANESP) injection - DIALYSIS  100 mcg Intravenous Q Tue-HD   feeding supplement (PROSource TF)  45 mL Per Tube TID   guaiFENesin  5 mL Per Tube BID   insulin aspart  0-9 Units Subcutaneous Q4H   insulin glargine-yfgn  30 Units Subcutaneous Daily   lidocaine  1 patch Transdermal Q24H   mouth rinse  15 mL Mouth Rinse 10 times per day   metaxalone  800 mg Per Tube TID   midodrine  5 mg Per Tube TID   multivitamin  1 tablet Per Tube QHS   pantoprazole sodium  40 mg Per Tube QHS   sertraline  100 mg Per Tube Daily   simethicone  40 mg Per Tube QID   sodium chloride flush  10-40 mL Intracatheter Q12H   Continuous Infusions:  sodium chloride 10 mL/hr at 10/04/2020 1429   sodium chloride 500 mL (09/01/20 1408)   sodium chloride     sodium chloride     sodium chloride     sodium chloride  sodium chloride     sodium chloride     feeding supplement (NEPRO CARB STEADY) 1,000 mL (09/22/20 0000)     LOS: 73 days     Time spent: 35 minutes.     Elmarie Shiley, MD [facility]   If 7PM-7AM, please contact night-coverage [email or web address]  09/22/2020, 7:12 AM

## 2020-09-22 NOTE — Progress Notes (Signed)
Elink attempted to arrange a video chat with [name]. She didn't not going the call. After waiting for 5 mins I attempted to call the wife on provided number with no answer. I attempted to call a second number provided by beside Rn and received a message phone was not in service. Video chat was ended at this time.

## 2020-09-22 NOTE — Progress Notes (Signed)
[facility] NEPHROLOGY PROGRESS NOTE  Assessment/ Plan:  # Acute kidney Injury-prolonged dialysis dependency, now progressed to ESRD:  ATN from septic shock-without any evidence of renal recovery to date . initiated on CRRT 07/18/20 and transitioned to IHD.  Has TDC, continue with HD on TTS schedule for now.  Tolerating dialysis well but unable to sit on chair or recliner.  Plan for HD today.  # Vascular access - had left forearm AVG placed 09/05/2020 but unfortunately had clotted.  s/p thrombectomy and revision 9/2, graft can be used 4 weeks thereafter.  Swelling of left upper extremity noted continue arm elevation and supportive care.  # Septic shock/ventilator associated pneumonia due to recurrent Acinetobacter infection: Off antibiotics and stable hemodynamics.  #  Chronic respiratory failure: Status post tracheostomy with ventilator management per CCM.  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  # Chronic kidney disease/metabolic bone disease: Calcium and phosphorus level under control with hemodialysis, off binders  # Anemia: contiue ESA, monitor Hb.  Transfuse as needed.  # Acute metabolic encephalopathy: Continue management as above.,  Supportive care.  # Disposition - pt cannot return to [facility] now that he is ESRD. He is going to [facility] in [place], [place]. He needs to have [place] Medicaid & his AVG needs to be mature prior to being accepted. Other possible barrier is pt is a permanent trach & vent pt.     Subjective: Seen and examined in ICU.  Remains anuric.  On vent.  Opens eyes with the name.  No new event. Objective Vital signs in last 24 hours: Vitals:   09/22/20 0400 09/22/20 0500 09/22/20 0744 09/22/20 0836  BP: 133/80 140/73  (!) 144/81  Pulse: 99 (!) 101    Resp: 20 18    Temp:   99 F (37.2 C)   TempSrc:   Axillary   SpO2: 97% 96%  96%  Weight:  81.9 kg    Height:       Weight change: 2 kg  Intake/Output  Summary (Last 24 hours) at 09/22/2020 0845 Last data filed at 09/22/2020 0600 Gross per 24 hour  Intake 990 ml  Output 75 ml  Net 915 ml        Labs: Basic Metabolic Panel: Recent Labs  Lab 09/18/20 1106 09/19/20 0017 09/19/20 0500 09/19/20 1607 09/20/20 0834  NA 130*   < > 133* 136 133*  K 3.3*   < > 3.6 3.9 3.7  CL 95*  --  96*  --  95*  CO2 24  --  25  --  24  GLUCOSE 119*  --  136*  --  133*  BUN 66*  --  51*  --  79*  CREATININE 1.16  --  0.98  --  1.32*  CALCIUM 9.5  --  9.4  --  9.4  PHOS 3.8  --   --   --  4.5   < > = values in this interval not displayed.    Liver Function Tests: Recent Labs  Lab 09/18/20 1106 09/20/20 0834  ALBUMIN 1.8* 1.6*    No results for input(s): LIPASE, AMYLASE in the last 168 hours. No results for input(s): AMMONIA in the last 168 hours. CBC: Recent Labs  Lab 09/16/20 0500 09/17/20 0457 09/18/20 0328 09/19/20 0017 09/19/20 0500 09/19/20 1607 09/20/20 0335  WBC 21.7* 20.0* 22.5*  --  23.8*  --  18.5*  HGB 7.2* 7.0* 7.6*   < > 8.3* 9.9* 7.6*  HCT 23.1* 22.1* 23.8*   < > 26.2* 29.0* 23.4*  MCV 81.9 80.7 81.0  --  81.6  --  80.4  PLT 442* 381 361  --  398  --  388   < > = values in this interval not displayed.    Cardiac Enzymes: No results for input(s): CKTOTAL, CKMB, CKMBINDEX, TROPONINI in the last 168 hours. CBG: Recent Labs  Lab 09/21/20 1604 09/21/20 2046 09/22/20 0044 09/22/20 0506 09/22/20 0739  GLUCAP 143* 125* 168* 157* 124*     Iron Studies: No results for input(s): IRON, TIBC, TRANSFERRIN, FERRITIN in the last 72 hours.  Studies/Results: No results found.  Medications: Infusions:  sodium chloride 10 mL/hr at 10/10/2020 1429   sodium chloride 500 mL (09/01/20 1408)   sodium chloride     sodium chloride     sodium chloride     sodium chloride     sodium chloride     sodium chloride     feeding supplement (NEPRO CARB STEADY) 1,000 mL (09/22/20 0000)    Scheduled Medications:  amiodarone   200 mg Per Tube Daily   apixaban  5 mg Per Tube BID   buPROPion  75 mg Per Tube BID   chlorhexidine gluconate (MEDLINE KIT)  15 mL Mouth Rinse BID   Chlorhexidine Gluconate Cloth  6 each Topical Daily   Chlorhexidine Gluconate Cloth  6 each Topical Q0600   darbepoetin (ARANESP) injection - DIALYSIS  100 mcg Intravenous Q Tue-HD   feeding supplement (PROSource TF)  45 mL Per Tube TID   guaiFENesin  5 mL Per Tube BID   insulin aspart  0-9 Units Subcutaneous Q4H   insulin glargine-yfgn  30 Units Subcutaneous Daily   lidocaine  1 patch Transdermal Q24H   mouth rinse  15 mL Mouth Rinse 10 times per day   metaxalone  800 mg Per Tube TID   midodrine  5 mg Per Tube TID   multivitamin  1 tablet Per Tube QHS   pantoprazole sodium  40 mg Per Tube QHS   sertraline  100 mg Per Tube Daily   simethicone  40 mg Per Tube QID   sodium chloride flush  10-40 mL Intracatheter Q12H    have reviewed scheduled and prn medications.  Physical Exam:  General:NAD,tracheostomy on vent, opens eyes with the name and arousable. Heart:RRR, s1s2 nl Lungs: Coarse breath sound bilateral, no increased work of breathing. Abdomen:soft, Non-tender, PEG tube and ostomy bag Extremities:Trace lower extremities edema Dialysis Access: LIJ TDC, LUE AVG T/B+, swelling of the left upper extremity noted.  Luis Orr 09/22/2020,8:45 AM  LOS: 73 days

## 2020-09-23 DIAGNOSIS — R4182 Altered mental status, unspecified: Secondary | ICD-10-CM | POA: Diagnosis not present

## 2020-09-23 LAB — CBC
HCT: 25.2 % — ABNORMAL LOW (ref 39.0–52.0)
HCT: 31.5 % — ABNORMAL LOW (ref 39.0–52.0)
Hemoglobin: 8.3 g/dL — ABNORMAL LOW (ref 13.0–17.0)
Hemoglobin: 10.2 g/dL — ABNORMAL LOW (ref 13.0–17.0)
MCH: 25.9 pg — ABNORMAL LOW (ref 26.0–34.0)
MCH: 25.3 pg — ABNORMAL LOW (ref 26.0–34.0)
MCHC: 32.9 g/dL (ref 30.0–36.0)
MCHC: 32.4 g/dL (ref 30.0–36.0)
MCV: 78.8 fL — ABNORMAL LOW (ref 80.0–100.0)
MCV: 78.2 fL — ABNORMAL LOW (ref 80.0–100.0)
Platelets: 421 10*3/uL — ABNORMAL HIGH (ref 150–400)
Platelets: 510 10*3/uL — ABNORMAL HIGH (ref 150–400)
RBC: 3.2 MIL/uL — ABNORMAL LOW (ref 4.22–5.81)
RBC: 4.03 MIL/uL — ABNORMAL LOW (ref 4.22–5.81)
RDW: 19.6 % — ABNORMAL HIGH (ref 11.5–15.5)
RDW: 19.9 % — ABNORMAL HIGH (ref 11.5–15.5)
WBC: 22.7 10*3/uL — ABNORMAL HIGH (ref 4.0–10.5)
WBC: 22.5 10*3/uL — ABNORMAL HIGH (ref 4.0–10.5)
nRBC: 0.8 % — ABNORMAL HIGH (ref 0.0–0.2)
nRBC: 1.3 % — ABNORMAL HIGH (ref 0.0–0.2)

## 2020-09-23 LAB — GLUCOSE, CAPILLARY
Glucose-Capillary: 119 mg/dL — ABNORMAL HIGH (ref 70–99)
Glucose-Capillary: 128 mg/dL — ABNORMAL HIGH (ref 70–99)
Glucose-Capillary: 135 mg/dL — ABNORMAL HIGH (ref 70–99)
Glucose-Capillary: 146 mg/dL — ABNORMAL HIGH (ref 70–99)
Glucose-Capillary: 169 mg/dL — ABNORMAL HIGH (ref 70–99)
Glucose-Capillary: 173 mg/dL — ABNORMAL HIGH (ref 70–99)

## 2020-09-23 LAB — BASIC METABOLIC PANEL
Anion gap: 14 (ref 5–15)
BUN: 101 mg/dL — ABNORMAL HIGH (ref 8–23)
CO2: 23 mmol/L (ref 22–32)
Calcium: 9.7 mg/dL (ref 8.9–10.3)
Chloride: 92 mmol/L — ABNORMAL LOW (ref 98–111)
Creatinine, Ser: 1.56 mg/dL — ABNORMAL HIGH (ref 0.61–1.24)
GFR, Estimated: 47 mL/min — ABNORMAL LOW (ref >60)
Glucose, Bld: 146 mg/dL — ABNORMAL HIGH (ref 70–99)
Potassium: 4.3 mmol/L (ref 3.5–5.1)
Sodium: 129 mmol/L — ABNORMAL LOW (ref 135–145)

## 2020-09-23 MED ORDER — HEPARIN SODIUM (PORCINE) 1000 UNIT/ML DIALYSIS: 1000.0000 [IU] | INTRAMUSCULAR | Status: DC | PRN

## 2020-09-23 MED ORDER — LIDOCAINE-PRILOCAINE 2.5-2.5 % EX CREA
1.0000 "application " | TOPICAL_CREAM | CUTANEOUS | Status: DC | PRN
  Filled 2020-09-23: qty 5

## 2020-09-23 MED ORDER — PENTAFLUOROPROP-TETRAFLUOROETH EX AERO: 1.0000 "application " | INHALATION_SPRAY | CUTANEOUS | Status: DC | PRN

## 2020-09-23 MED ORDER — SODIUM CHLORIDE 0.9 % IV SOLN: 100.0000 mL | INTRAVENOUS | Status: DC | PRN

## 2020-09-23 MED ORDER — LIDOCAINE HCL (PF) 1 % IJ SOLN: 5.0000 mL | INTRAMUSCULAR | Status: DC | PRN

## 2020-09-23 MED ORDER — ALTEPLASE 2 MG IJ SOLR
2.0000 mg | Freq: Once | INTRAMUSCULAR | Status: DC | PRN
  Filled 2020-09-23: qty 2

## 2020-09-23 NOTE — Progress Notes (Signed)
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  Patient presented secondary to altered mental status and hypotension with recurrent pneumonia requiring vasopressor support and ICU admission.  He has had multiple recent admission for sepsis secondary to various infectious including UTI, pneumonia and bacteremia in the setting of MDR Klebsiella, MRSA and Pseudomonas.  Most recent admission September 2021 in setting of sepsis due to MDR Klebsiella with right ureteral calculus status post double JJ ureteral stent placement and treated with meropenem for 10 days.  He was discharged back to [facility].  He was admitted 6/29 to the ICU with septic shock on Levophed.  Trach asp gross MDR Acinetobacter treated with meropenem.  Patient progress  to renal failure requiring renal replacement therapy.  Patient with prolonged hospital course.  See below.    ICU significant events: 6/29 admitted to ICU for septic shock on levo to maintain MAP>65, continued on vent support 6/29 Blood Cx>> staph species in 1 of 4 cultures drawn>> suspect contaminant 6/30 Off pressors 7/2 ID Consult for MDR acinetobacter in trach asp, Meropenem changed to unasyn 7/2 PRBC transfusion, iron replacement 7/5 Pressors added again. Central line placed. Drainage from gastrostomy tube >> CT Abd without fluid collection around gastrostomy tube but with diffuse body wall edema. Echo EF 50-55%, mild LVH 7/6 Renal Replacement Therapy ; Code status changed to partial Code (DNR) 7/7 Transfuse 1U PRBC 7/9 add solu cortef 7/10 off pressors, weaning stress steroids 7/11 Pressors added back on again 7/12 ID consult due to rising WBC, hypothermia, hypotension concern for worsening infection. Vancomycin added. Repeat blood cx  collected. 7/15 CRRT stopped 7/17 Restarted on low dose levo. No evidence of new infection. Held off on antibiotics. Nephrology contacted family and decision was made to continue iHD this week to allow additional time for renal recovery. Patient is not the best long term iHD candidate but family wants aggressive care 7/18 plans for iHD, tolerated with 2L removed 719 slight drop in hgb to 6.9 will transfuse 1 unit PRBC 7/22 > 7/26: Tolerating HD. TOC consulted for placement. Will likely need out of state placement. 8/15 WBCs greater than 28,000, hypoglycemia, procalcitonin >18, chest x-ray consistent with pneumonia, recent blood cultures consistent with contamination.  Unasyn IV resumed after sputum culture obtained 8/16 consultation placed with ethics committee regarding evaluation for futility of care 8/16 Sputum cx + for Acinetobacter with sensitivities pending 8/17 extensive conversation held with patient's wife regarding patient's poor prognosis and reemergence of Acinetobacter VAP.  Also discussed with her that patient has been able to convey to multiple staff that he does not want to continue on the ventilator or with dialysis.  Wife reports that the patient does not understand what he is saying, she is the POA and that she wants to continue aggressive care. 8/18 patient now telling nephrology, myself and the rest of the staff he replies yes to wanting to remain on dialysis and on the ventilator and this direct question is asked. 8/18 Acinetobacter sensitive to Unasyn only.  Discussed with ID/Dr. Juleen China.  Plan is to administer antibiotics for 7-day since patient has not had any increased O2 needs and remains afebrile.  If any concerns over worsening symptoms can give an additional 3 days. Developed moderate bleeding around tracheostomy site on 8/19.  Likely related to combination of Eliquis  and heparin required for CRRT.  We will hold Eliquis for now and monitor degree of bleeding Eliquis resumed  on 8/22.  Overnight into 8/23 patient bit his tongue and had bleeding.  Also had some bleeding around trach likely from oral bleeding.  Also though did have bleeding from old central line site. 8/23 had transient bleeding around trach and prior central line insertion site overnight.  Patient had bitten tongue and this likely explain the bleeding.  This morning patient was not having any bleeding.  On 8/25 had redeveloped slow ooze bleeding from mouth and trach.  Eliquis discontinued in favor of IV heparin especially given patient will undergo surgical procedure for vascular access on 8/29 8/30 Left FA AVG occluded 9/2 status post thrombectomy and revision of LUE AV graft 9/4 LUE edema with negative venous duplex. 9/5 heparin discontinued and Eliquis resumed    Assessment & Plan:   Active Problems:   Ventilator dependent (HCC)   Septic shock (HCC)   Pressure injury of skin   Acute respiratory failure with hypoxia (HCC)   AKI (acute kidney injury) (HCC)   Hypotension   Goals of care, counseling/discussion   Quadriplegia (HCC)   Chronic kidney disease requiring chronic dialysis ([place])   Hyperglycemia   Sepsis due to Acinetobacter baumannii ([place])   Pneumonia due to Acinetobacter species ([place])   Dysautonomia (HCC)   Chronic midline thoracic back pain   Abdominal distention   1-Acute on chronic respiratory failure with hypoxia and hypercapnia: Chronic Vent Dependent.  Treated for PNA..  Stable.  Continue with suctioning.   Recurrent ventilator associated pneumonia secondary to recurrent see no bacteremia/sepsis without shock -Completed antibiotics treatment.   AKI with oliguria/volume overload/new CKD requiring hemodialysis Continue with HD.  -Needs AVG mature to be able to transfer to [place].   Septic shock secondary to VAP POA -Resolved.   Chronic thoracic back pain: -Continue with pain management.   History of paroxysmal A. fib: On amiodarone and eliquis.   Acute  metabolic encephalopathy Secondary to acute illness.   Leukocytosis; Afebrile. Follow trend.   Diabetes type 2: Continue with Semglee and SSI.   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   Shock  liver: Resolved.   Quadriplegia  secondary to compressive myelopathy -support care.  -poor prognosis.   Macrocytic anemia: -S/p 5 units of packed red blood cell.  Severe protein caloric malnutrition: Continue with tube feeding PEG tube unable to be declogged successfully.  Feeding tube on backorder.    Pressure injury/stage IV sacral decubitus: Stage IV medial/posterior sacrum present on admission.  Right/posterior/lateral ankle unknown if present on admission.  DTI left ear, not present on admission. (For additional documentation see wound care notes)      Pressure Injury 06/21/2020 Sacrum Posterior;Medial Stage 4 - Full thickness tissue loss with exposed bone, tendon or muscle. (Active)  06/25/2020 2240  Location: Sacrum  Location Orientation: Posterior;Medial  Staging: Stage 4 - Full thickness tissue loss with exposed bone, tendon or muscle.  Wound Description (Comments):   Present on Admission: Yes     Pressure Injury 07/23/20 Ear Left Deep Tissue Pressure Injury - Purple or maroon localized area of discolored intact skin or blood-filled blister due to damage of underlying soft tissue from pressure and/or shear. DTI left ear (Active)  07/23/20 2000  Location: Ear  Location Orientation: Left  Staging: Deep Tissue Pressure Injury - Purple or maroon localized area of discolored intact skin or blood-filled blister due to  damage of underlying soft tissue from pressure and/or shear.  Wound Description (Comments): DTI left ear  Present on Admission: No     Pressure Injury 09/03/20 Heel Right Stage 2 -  Partial thickness loss of dermis presenting as a shallow open injury with a red, pink wound bed without slough.  (Active)  09/03/20 0800  Location: Heel  Location Orientation: Right  Staging: Stage 2 -  Partial thickness loss of dermis presenting as a shallow open injury with a red, pink wound bed without slough.  Wound Description (Comments):   Present on Admission:      Nutrition Problem: Increased nutrient needs Etiology: wound healing    Signs/Symptoms: estimated needs    Interventions: Tube feeding, Prostat, MVI  Estimated body mass index is 29.14 kg/m as calculated from the following:   Height as of this encounter: 5' 6"  (1.676 m).   Weight as of this encounter: 81.9 kg.   DVT prophylaxis: eliquis Code Status: partial  Family Communication: Disposition Plan:  Status is: Inpatient  Remains inpatient appropriate because:Ongoing active pain requiring inpatient pain management  Dispo: The patient is from: SNF              Anticipated d/c is to: SNF              Patient currently is medically stable to d/c.   Difficult to place patient No        Consultants:  CCM   Subjective: He is alert, eyes open, non verbal.   Objective: Vitals:   09/23/20 1000 09/23/20 1100 09/23/20 1200 09/23/20 1214  BP: 107/61 (!) 145/81 (!) 171/89   Pulse: 87 93 98   Resp: 20 20 20    Temp:   98.6 F (37 C)   TempSrc:   Axillary   SpO2: 99% 98% 97% 97%  Weight:      Height:        Intake/Output Summary (Last 24 hours) at 09/23/2020 1433 Last data filed at 09/23/2020 0800 Gross per 24 hour  Intake 5785 ml  Output 300 ml  Net 5485 ml    Filed Weights   09/20/20 1545 09/21/20 0500 09/22/20 0500  Weight: 77.8 kg 77.8 kg 81.9 kg    Examination:  General exam: Alert trach in place.  Respiratory system: BL air movement,. [place].  Cardiovascular system: S 1, S 2 RRR Gastrointestinal system: BS present, soft, nt Central nervous system: Alert, Quadriplegic.  Extremities: No edema   Data Reviewed: I have personally reviewed following labs and imaging studies  CBC: Recent  Labs  Lab 09/17/20 0457 09/18/20 0328 09/19/20 0017 09/19/20 0500 09/19/20 1607 09/20/20 0335 09/23/20 0755  WBC 20.0* 22.5*  --  23.8*  --  18.5* 22.7*  HGB 7.0* 7.6* 9.5* 8.3* 9.9* 7.6* 8.3*  HCT 22.1* 23.8* 28.0* 26.2* 29.0* 23.4* 25.2*  MCV 80.7 81.0  --  81.6  --  80.4 78.8*  PLT 381 361  --  398  --  388 421*    Basic Metabolic Panel: Recent Labs  Lab 09/18/20 1106 09/19/20 0017 09/19/20 0500 09/19/20 1607 09/20/20 0834 09/23/20 0755  NA 130* 136 133* 136 133* 129*  K 3.3* 3.9 3.6 3.9 3.7 4.3  CL 95*  --  96*  --  95* 92*  CO2 24  --  25  --  24 23  GLUCOSE 119*  --  136*  --  133* 146*  BUN 66*  --  51*  --  79* 101*  CREATININE  1.16  --  0.98  --  1.32* 1.56*  CALCIUM 9.5  --  9.4  --  9.4 9.7  MG  --   --  2.2  --   --   --   PHOS 3.8  --   --   --  4.5  --     GFR: Estimated Creatinine Clearance: 44.2 mL/min (A) (by C-G formula based on SCr of 1.56 mg/dL (H)). Liver Function Tests: Recent Labs  Lab 09/18/20 1106 09/20/20 0834  ALBUMIN 1.8* 1.6*    No results for input(s): LIPASE, AMYLASE in the last 168 hours. No results for input(s): AMMONIA in the last 168 hours. Coagulation Profile: No results for input(s): INR, PROTIME in the last 168 hours. Cardiac Enzymes: No results for input(s): CKTOTAL, CKMB, CKMBINDEX, TROPONINI in the last 168 hours. BNP (last 3 results) No results for input(s): PROBNP in the last 8760 hours. HbA1C: No results for input(s): HGBA1C in the last 72 hours. CBG: Recent Labs  Lab 09/22/20 1946 09/22/20 2323 09/23/20 0343 09/23/20 0845 09/23/20 1310  GLUCAP 200* 171* 146* 135* 128*    Lipid Profile: No results for input(s): CHOL, HDL, LDLCALC, TRIG, CHOLHDL, LDLDIRECT in the last 72 hours. Thyroid Function Tests: No results for input(s): TSH, T4TOTAL, FREET4, T3FREE, THYROIDAB in the last 72 hours. Anemia Panel: No results for input(s): VITAMINB12, FOLATE, FERRITIN, TIBC, IRON, RETICCTPCT in the last 72  hours. Sepsis Labs: Recent Labs  Lab 09/18/20 0925  LATICACIDVEN 1.0     Recent Results (from the past 240 hour(s))  Surgical PCR screen     Status: None   Collection Time: 10/10/2020  9:25 AM   Specimen: Nasal Mucosa; Nasal Swab  Result Value Ref Range Status   MRSA, PCR NEGATIVE NEGATIVE Final   Staphylococcus aureus NEGATIVE NEGATIVE Final    Comment: (NOTE) The Xpert SA Assay (FDA approved for NASAL specimens in patients 78 years of age and older), is one component of a comprehensive surveillance program. It is not intended to diagnose infection nor to guide or monitor treatment. Performed at [facility], [place] [address] [place], [place] [address]           Radiology Studies: No results found.      Scheduled Meds:  amiodarone  200 mg Per Tube Daily   apixaban  5 mg Per Tube BID   buPROPion  75 mg Per Tube BID   chlorhexidine gluconate (MEDLINE KIT)  15 mL Mouth Rinse BID   Chlorhexidine Gluconate Cloth  6 each Topical Daily   Chlorhexidine Gluconate Cloth  6 each Topical Q0600   darbepoetin (ARANESP) injection - DIALYSIS  100 mcg Intravenous Q Tue-HD   feeding supplement (PROSource TF)  45 mL Per Tube TID   guaiFENesin  5 mL Per Tube BID   insulin aspart  0-9 Units Subcutaneous Q4H   insulin glargine-yfgn  30 Units Subcutaneous Daily   lidocaine  1 patch Transdermal Q24H   mouth rinse  15 mL Mouth Rinse 10 times per day   metaxalone  800 mg Per Tube TID   midodrine  5 mg Per Tube TID   multivitamin  1 tablet Per Tube QHS   pantoprazole sodium  40 mg Per Tube QHS   sertraline  100 mg Per Tube Daily   simethicone  40 mg Per Tube QID   sodium chloride flush  10-40 mL Intracatheter Q12H   Continuous Infusions:  sodium chloride 10 mL/hr at 09/20/2020 1429  sodium chloride 500 mL (09/01/20 1408)   sodium chloride     sodium chloride     sodium chloride     sodium chloride     sodium chloride     sodium chloride     feeding supplement (NEPRO CARB  STEADY) 45 mL/hr at 09/23/20 0800     LOS: 74 days    Time spent: 35 minutes.     Elmarie Shiley, MD [facility]   If 7PM-7AM, please contact night-coverage [email or web address]  09/23/2020, 2:33 PM

## 2020-09-23 NOTE — Progress Notes (Signed)
Able to connect sister via video chat earlier in the evening.

## 2020-09-23 NOTE — Progress Notes (Signed)
[facility] NEPHROLOGY PROGRESS NOTE  Assessment/ Plan:  # Acute kidney Injury-prolonged dialysis dependency, now progressed to ESRD:  ATN from septic shock-without any evidence of renal recovery to date . initiated on CRRT 07/18/20 and transitioned to IHD.  Has TDC, continue with HD on TTS schedule for now.  Tolerating dialysis well but unable to sit on chair or recliner.  Plan for HD today, postponed from yesterday.  # Vascular access - had left forearm AVG placed 08/24/2020 but unfortunately had clotted.  s/p thrombectomy and revision 9/2, graft can be used 4 weeks thereafter.  Swelling of left upper extremity noted continue arm elevation and supportive care.  # Septic shock/ventilator associated pneumonia due to recurrent Acinetobacter infection: Off antibiotics and stable hemodynamics.  #  Chronic respiratory failure: Status post tracheostomy with ventilator management per CCM.  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  # Chronic kidney disease/metabolic bone disease: Calcium and phosphorus level under control with hemodialysis, off binders  # Anemia: contiue ESA, monitor Hb.  Transfuse as needed.  # Acute metabolic encephalopathy: Continue management as above.,  Supportive care.  # Disposition - pt cannot return to [facility] now that he is ESRD. He is going to [facility] in [place], [place]. He needs to have [place] Medicaid & his AVG needs to be mature prior to being accepted. Other possible barrier is pt is a permanent trach & vent pt.     Subjective: Seen and examined in ICU.  Unable to get dialysis yesterday because of staffing, many patients.  He looks stable.  Remains anuric.  No new event. Objective Vital signs in last 24 hours: Vitals:   09/23/20 0700 09/23/20 0715 09/23/20 0719 09/23/20 0900  BP: 127/80 117/73 117/73   Pulse: 91 93    Resp: 20 20    Temp:    99.2 F (37.3 C)  TempSrc:    Axillary  SpO2: 97% 98% 96%    Weight:      Height:       Weight change:   Intake/Output Summary (Last 24 hours) at 09/23/2020 0943 Last data filed at 09/23/2020 0800 Gross per 24 hour  Intake 6460 ml  Output 200 ml  Net 6260 ml        Labs: Basic Metabolic Panel: Recent Labs  Lab 09/18/20 1106 09/19/20 0017 09/19/20 0500 09/19/20 1607 09/20/20 0834 09/23/20 0755  NA 130*   < > 133* 136 133* 129*  K 3.3*   < > 3.6 3.9 3.7 4.3  CL 95*  --  96*  --  95* 92*  CO2 24  --  25  --  24 23  GLUCOSE 119*  --  136*  --  133* 146*  BUN 66*  --  51*  --  79* 101*  CREATININE 1.16  --  0.98  --  1.32* 1.56*  CALCIUM 9.5  --  9.4  --  9.4 9.7  PHOS 3.8  --   --   --  4.5  --    < > = values in this interval not displayed.    Liver Function Tests: Recent Labs  Lab 09/18/20 1106 09/20/20 0834  ALBUMIN 1.8* 1.6*    No results for input(s): LIPASE, AMYLASE in the last 168 hours. No results for input(s): AMMONIA in the last 168 hours. CBC: Recent Labs  Lab 09/17/20 0457 09/18/20 0328 09/19/20 0017 09/19/20 0500 09/19/20 1607 09/20/20 0335 09/23/20 0755  WBC 20.0* 22.5*  --  23.8*  --  18.5* 22.7*  HGB 7.0* 7.6*   < > 8.3* 9.9* 7.6* 8.3*  HCT 22.1* 23.8*   < > 26.2* 29.0* 23.4* 25.2*  MCV 80.7 81.0  --  81.6  --  80.4 78.8*  PLT 381 361  --  398  --  388 421*   < > = values in this interval not displayed.    Cardiac Enzymes: No results for input(s): CKTOTAL, CKMB, CKMBINDEX, TROPONINI in the last 168 hours. CBG: Recent Labs  Lab 09/22/20 1533 09/22/20 1946 09/22/20 2323 09/23/20 0343 09/23/20 0845  GLUCAP 159* 200* 171* 146* 135*     Iron Studies: No results for input(s): IRON, TIBC, TRANSFERRIN, FERRITIN in the last 72 hours.  Studies/Results: No results found.  Medications: Infusions:  sodium chloride 10 mL/hr at 09/28/2020 1429   sodium chloride 500 mL (09/01/20 1408)   sodium chloride     sodium chloride     sodium chloride     sodium chloride     sodium chloride      sodium chloride     feeding supplement (NEPRO CARB STEADY) 1,000 mL (09/23/20 0250)    Scheduled Medications:  amiodarone  200 mg Per Tube Daily   apixaban  5 mg Per Tube BID   buPROPion  75 mg Per Tube BID   chlorhexidine gluconate (MEDLINE KIT)  15 mL Mouth Rinse BID   Chlorhexidine Gluconate Cloth  6 each Topical Daily   Chlorhexidine Gluconate Cloth  6 each Topical Q0600   darbepoetin (ARANESP) injection - DIALYSIS  100 mcg Intravenous Q Tue-HD   feeding supplement (PROSource TF)  45 mL Per Tube TID   guaiFENesin  5 mL Per Tube BID   insulin aspart  0-9 Units Subcutaneous Q4H   insulin glargine-yfgn  30 Units Subcutaneous Daily   lidocaine  1 patch Transdermal Q24H   mouth rinse  15 mL Mouth Rinse 10 times per day   metaxalone  800 mg Per Tube TID   midodrine  5 mg Per Tube TID   multivitamin  1 tablet Per Tube QHS   pantoprazole sodium  40 mg Per Tube QHS   sertraline  100 mg Per Tube Daily   simethicone  40 mg Per Tube QID   sodium chloride flush  10-40 mL Intracatheter Q12H    have reviewed scheduled and prn medications.  Physical Exam:  General:NAD, arousable with name and opening eyes, tracheostomy on vent. Heart:RRR, s1s2 nl Lungs: Coarse breath sound bilateral, no increased work of breathing. Abdomen:soft, Non-tender, PEG tube and ostomy bag Extremities:Trace lower extremities edema Dialysis Access: LIJ TDC, LUE AVG T/B+, swelling of the left upper extremity noted.  Luis Orr Luis Orr Luis Orr 09/23/2020,9:43 AM  LOS: 74 days

## 2020-09-23 NOTE — Progress Notes (Signed)
Assisted tele visit to patient with wife.  Aviah Sorci M, RN  

## 2020-09-24 ENCOUNTER — Inpatient Hospital Stay (HOSPITAL_COMMUNITY): Payer: 59

## 2020-09-24 DIAGNOSIS — Z9911 Dependence on respirator [ventilator] status: Secondary | ICD-10-CM | POA: Diagnosis not present

## 2020-09-24 DIAGNOSIS — A4159 Other Gram-negative sepsis: Secondary | ICD-10-CM | POA: Diagnosis not present

## 2020-09-24 DIAGNOSIS — D72829 Elevated white blood cell count, unspecified: Secondary | ICD-10-CM

## 2020-09-24 DIAGNOSIS — J9601 Acute respiratory failure with hypoxia: Secondary | ICD-10-CM | POA: Diagnosis not present

## 2020-09-24 DIAGNOSIS — A419 Sepsis, unspecified organism: Secondary | ICD-10-CM | POA: Diagnosis not present

## 2020-09-24 DIAGNOSIS — G8250 Quadriplegia, unspecified: Secondary | ICD-10-CM | POA: Diagnosis not present

## 2020-09-24 LAB — RENAL FUNCTION PANEL
Albumin: 1.5 g/dL — ABNORMAL LOW (ref 3.5–5.0)
Anion gap: 12 (ref 5–15)
BUN: 59 mg/dL — ABNORMAL HIGH (ref 8–23)
CO2: 23 mmol/L (ref 22–32)
Calcium: 9 mg/dL (ref 8.9–10.3)
Chloride: 95 mmol/L — ABNORMAL LOW (ref 98–111)
Creatinine, Ser: 1.11 mg/dL (ref 0.61–1.24)
GFR, Estimated: >60 mL/min (ref >60)
Glucose, Bld: 165 mg/dL — ABNORMAL HIGH (ref 70–99)
Phosphorus: 3.6 mg/dL (ref 2.5–4.6)
Potassium: 3.9 mmol/L (ref 3.5–5.1)
Sodium: 130 mmol/L — ABNORMAL LOW (ref 135–145)

## 2020-09-24 LAB — GLUCOSE, CAPILLARY
Glucose-Capillary: 101 mg/dL — ABNORMAL HIGH (ref 70–99)
Glucose-Capillary: 113 mg/dL — ABNORMAL HIGH (ref 70–99)
Glucose-Capillary: 114 mg/dL — ABNORMAL HIGH (ref 70–99)
Glucose-Capillary: 137 mg/dL — ABNORMAL HIGH (ref 70–99)
Glucose-Capillary: 138 mg/dL — ABNORMAL HIGH (ref 70–99)
Glucose-Capillary: 152 mg/dL — ABNORMAL HIGH (ref 70–99)

## 2020-09-24 LAB — CBC
HCT: 24.5 % — ABNORMAL LOW (ref 39.0–52.0)
Hemoglobin: 7.8 g/dL — ABNORMAL LOW (ref 13.0–17.0)
MCH: 25.2 pg — ABNORMAL LOW (ref 26.0–34.0)
MCHC: 31.8 g/dL (ref 30.0–36.0)
MCV: 79.3 fL — ABNORMAL LOW (ref 80.0–100.0)
Platelets: 420 10*3/uL — ABNORMAL HIGH (ref 150–400)
RBC: 3.09 MIL/uL — ABNORMAL LOW (ref 4.22–5.81)
RDW: 19.8 % — ABNORMAL HIGH (ref 11.5–15.5)
WBC: 25.4 10*3/uL — ABNORMAL HIGH (ref 4.0–10.5)
nRBC: 0.7 % — ABNORMAL HIGH (ref 0.0–0.2)

## 2020-09-24 MED ORDER — SODIUM CHLORIDE 0.9 % IV SOLN: 1.0000 g | INTRAVENOUS | Status: DC

## 2020-09-24 MED ORDER — SODIUM CHLORIDE 0.9 % IV SOLN
2.0000 g | INTRAVENOUS | Status: DC
  Administered 2020-09-24 – 2020-09-25 (×2): 2 g via INTRAVENOUS
  Filled 2020-09-24 (×2): qty 20

## 2020-09-24 MED ORDER — METRONIDAZOLE 50 MG/ML ORAL SUSPENSION
500.0000 mg | Freq: Two times a day (BID) | ORAL | Status: DC
  Administered 2020-09-24 – 2020-09-25 (×3): 500 mg
  Filled 2020-09-24 (×4): qty 10

## 2020-09-24 MED ORDER — IOHEXOL 9 MG/ML PO SOLN
ORAL | Status: AC
  Administered 2020-09-24: 500 mL
  Filled 2020-09-24: qty 1000

## 2020-09-24 NOTE — TOC Progression Note (Addendum)
Transition of Care [facility]) - Progression Note    Patient Details  Name: Luis Orr MRN: [id number] Date of Birth: December 19, 1950  [facility] CM/SW [place], RN Phone Number: 09/24/2020, 12:43 PM  Clinical Narrative:    Case management spoke with Tommi Rumps, RT in Interventional Radiology and the patient's G-tube was replaced on 09/07/2020.  I spoke with Roselyn Reef, RN at bedside the nursing staff has been using the g-tube without difficulty and providing medications and feeds as ordered.  The patient has a present Cortrak in place and I will ask Marisue Ivan about discontinuing the Cortrak and placing and order for bedside nursing.  CM left a message with Amedeo Plenty, MSW at [facility] to update the facility and check with bed availability and plan for transfer once the facility has [place] Medicaid approval and or coverage under the patient's Medicare policy if able.      Expected Discharge Plan: [place] Barriers to Discharge: Continued Medical Work up, No SNF bed, Vent Bed not available  Expected Discharge Plan and Services Expected Discharge Plan: [place]   Discharge Planning Services: CM Consult Post Acute Care Choice: [place] Living arrangements for the past 2 months: Post-Acute Facility                                       Social Determinants of Health (SDOH) Interventions    Readmission Risk Interventions No flowsheet data found.

## 2020-09-24 NOTE — Progress Notes (Addendum)
[facility] PROGRESS NOTE  Luis Orr GNF:[id number] DOB: 1950-07-04 DOA: 06/30/2020 PCP: Townsend Roger, MD  Status: Remains inpatient appropriate because:Hemodynamically unstable, Persistent severe electrolyte disturbances, Unsafe d/c plan, and Inpatient level of care appropriate due to severity of illness  Dispo: The patient is from: [facility] vent capable              Anticipated d/c is to: SNF Vent capable w/ access to in facility/stretcher based HD-a facility in [place] has been located in preauthorization in progress.              Patient currently is not medically stable to d/c.   Difficult to place patient Yes    Level of care: ICU  Code Status: Partial (no CPR and no defibrillation or cardioversion) Family Communication: Luis Orr on 8/22.  Of note the Luis is also the POA DVT prophylaxis: IV Heparin COVID vaccination status: Unknown    HPI:  70 y.o. 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 Patient presented secondary to altered mental status and hypotension with recurrent pneumonia requiring vasopressor support and ICU admission.he has had multiple recent admissions for sepsis secondary to various infections including UTIs, pneumonias and bacteremia in setting of MDR Klebsiella, MRSA and pseudomonas. Most recent admission September 2021 in setting of sepsis due to MDR Klebsiella with right ureteral calculus s/p double J ureteral stent placement and treated with meropenem x 10days. He was discharged back to [facility].  ICU significant events: 6/29 admitted to ICU for septic shock on levo to maintain MAP>65, continued on vent support 6/29 Blood Cx>> staph species in 1 of 4 cultures drawn>> suspect contaminant 6/30 Off pressors 7/2 ID Consult for MDR acinetobacter in trach asp, Meropenem changed to unasyn 7/2 PRBC transfusion, iron replacement 7/5 Pressors added again. Central line placed.  Drainage from gastrostomy tube >> CT Abd without fluid collection around gastrostomy tube but with diffuse body wall edema. Echo EF 50-55%, mild LVH 7/6 Renal Replacement Therapy ; Code status changed to partial Code (DNR) 7/7 Transfuse 1U PRBC 7/9 add solu cortef 7/10 off pressors, weaning stress steroids 7/11 Pressors added back on again 7/12 ID consult due to rising WBC, hypothermia, hypotension concern for worsening infection. Vancomycin added. Repeat blood cx collected. 7/15 CRRT stopped 7/17 Restarted on low dose levo. No evidence of new infection. Held off on antibiotics. Nephrology contacted family and decision was made to continue iHD this week to allow additional time for renal recovery. Patient is not the best long term iHD candidate but family wants aggressive care 7/18 plans for iHD, tolerated with 2L removed 719 slight drop in hgb to 6.9 will transfuse 1 unit PRBC 7/22 > 7/26: Tolerating HD. TOC consulted for placement. Will likely need out of state placement. 8/15 WBCs greater than 28,000, hypoglycemia, procalcitonin >18, chest x-ray consistent with pneumonia, recent blood cultures consistent with contamination.  Unasyn IV resumed after sputum culture obtained 8/16 consultation placed with ethics committee regarding evaluation for futility of care 8/16 Sputum cx + for Acinetobacter with sensitivities pending 8/17 extensive conversation held with patient's Luis regarding patient's poor prognosis and reemergence of Acinetobacter VAP.  Also discussed with her that patient has been able to convey to multiple staff that he does not want to continue on the ventilator or with dialysis.  Luis reports that the patient does not understand what he is saying, she is the POA and that she wants to continue aggressive care. 8/18 patient  now telling nephrology, myself and the rest of the staff he replies yes to wanting to remain on dialysis and on the ventilator and this direct question is  asked. 8/18 Acinetobacter sensitive to Unasyn only.  Discussed with ID/Dr. Juleen China.  Plan is to administer antibiotics for 7-day since patient has not had any increased O2 needs and remains afebrile.  If any concerns over worsening symptoms can give an additional 3 days. Developed moderate bleeding around tracheostomy site on 8/19.  Likely related to combination of Eliquis and heparin required for CRRT.  We will hold Eliquis for now and monitor degree of bleeding Eliquis resumed on 8/22.  Overnight into 8/23 patient bit his tongue and had bleeding.  Also had some bleeding around trach likely from oral bleeding.  Also though did have bleeding from old central line site. 8/23 had transient bleeding around trach and prior central line insertion site overnight.  Patient had bitten tongue and this likely explain the bleeding.  This morning patient was not having any bleeding.  On 8/25 had redeveloped slow ooze bleeding from mouth and trach.  Eliquis discontinued in favor of IV heparin especially given patient will undergo surgical procedure for vascular access on 8/29 8/30 Left FA AVG occluded 9/2 status post thrombectomy and revision of LUE AV graft 9/4 LUE edema with negative venous duplex. 9/5 heparin discontinued and Eliquis resumed    Subjective: Awakened.  Continues to appear sad but did make eye contact and interact more today.  Objective: Vitals:   09/24/20 0700 09/24/20 0723  BP: 109/68   Pulse: 89   Resp: 20   Temp:    SpO2: 100% 100%    Intake/Output Summary (Last 24 hours) at 09/24/2020 0800 Last data filed at 09/24/2020 0600 Gross per 24 hour  Intake 540 ml  Output 2625 ml  Net -2085 ml   Filed Weights   09/23/20 1345 09/23/20 1705 09/24/20 0500  Weight: 83.2 kg 81.5 kg 80.1 kg    Exam:  Constitutional: Awake, flat affect but interacting more today. Respiratory: Trach: 6.0 XLT cuffed, Vent settings: PRVC mode, Fio2 40%, rate 20, VT 510, PEEP 5; excessive clear secretions  have saturated trach dressing in oozed out around trach.  Trach dressing has been changed and patient's RN is planning on changing to an absorbent dressing.  Lungs remain coarse bilaterally although more clear on the left with expiratory rhonchi RML and RLL. Cardiovascular: SR, normotensive, no bilateral lower extremity edema.  Stable LUE edema.  Decreased erythema directly over AVG site-continues with palpable thrill Abdomen: LBM 9/11, PEG tube clogged but has subsequently been replaced and has been functional.  Cortrack in place for feedings; PEG tube in place - occluded.  Left lower quadrant colostomy in place, abdomen distended but nontender with normoactive bowel sounds.  Neurologic: Chronic quadriplegia so unable to independently move extremities.  No sensation below shoulders. Psychiatric: Awake avoiding eye contact, has flat affect, has been crying and appears very depressed.   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-ischial symptoms secondary to combination Acinetobacter pneumonia and volume overload Redeveloped Acinetobacter pneumonia (8/14) and completed 7 days of Unasyn Ventilator management per PCCM  Recurrent ventilator associated pneumonia 2/2 recurrent Acinetobacter/sepsis without shock WBCs stable with positive sputum culture sensitive only to Unasyn ID/Dr Juleen China recommended 7 days of therapy -completed on 8/22   AKI with oliguria/volume overload/ New CKD requiring HD Anuric Nephrology following, initially required CRRT and has transitioned to HD (  due to staffing issues continues on CRRT at bedside) LUE AVG functional   Septic shock 2/2 VAP POA Presented with sepsis physiology and profound shock which has now resolved  Chronic thoracic back pain Continue Oxy and fentanyl prn; Skelaxin and lidocaine patch  History of paroxysmal atrial fibrillation Patient was on amiodarone prior to  admission -maintaining SR AVG functional heparin discontinued on 9 5 and Eliquis resumed  TSH was WNL this admission QTc 420 ms on 3/53   Acute metabolic encephalopathy Resolved Secondary to acute illness. CT head negative for acute process.  Back at baseline.   Diabetes mellitus, type 2 Continue Semglee 30 units daily.  Continue SSI w/ CBG checks every 4 hours  Persistent leukocytosis Patient has had persistent leukocytosis during the hospital and for the most part has not had fever except for when he redeveloped Acinetobacter pneumonia which has been fully treated.-blood cultures at that time with active infection were negative.   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  Resolved.  Quadriplegia secondary to compressive myelopathy.  [facility] will not accept him back due to HD dependent and inability to sit up in chair for dialysis treatment   Microcytic anemia/Acute anemia superimposed on anemia of chronic kidney disease S/P 5 units of PRBC  Continue Aranesp and follow Hgb prn   Severe protein calorie malnutrition PEG has been replaced and is functional Discontinue Cortark  Pressure injury/stage IV sacral decubitus Stage IV medial/posterior sacrum present on admission.  Right/posterior/lateral ankle unknown if present on admission.  DTI left ear, not present on admission. (For additional documentation see wound care notes)    Data Reviewed: Basic Metabolic Panel: Recent Labs  Lab  09/18/20 1106 09/19/20 0017 09/19/20 0500 09/19/20 1607 09/20/20 0834 09/23/20 0755 09/24/20 0425  NA 130*   < > 133* 136 133* 129* 130*  K 3.3*   < > 3.6 3.9 3.7 4.3 3.9  CL 95*  --  96*  --  95* 92* 95*  CO2 24  --  25  --  $R'24 23 23  'pO$ GLUCOSE 119*  --  136*  --  133* 146* 165*  BUN 66*  --  51*  --  79* 101* 59*  CREATININE 1.16  --  0.98  --  1.32* 1.56* 1.11  CALCIUM 9.5  --  9.4  --  9.4 9.7 9.0  MG  --   --  2.2  --   --   --   --   PHOS 3.8  --   --   --  4.5  --  3.6   < > = values in this interval not displayed.   Liver Function Tests: Recent Labs  Lab 09/18/20 1106 09/20/20 0834 09/24/20 0425  ALBUMIN 1.8* 1.6* 1.5*    CBC: Recent Labs  Lab 09/19/20 0500 09/19/20 1607 09/20/20 0335 09/23/20 0755 09/23/20 1542 09/24/20 0425  WBC 23.8*  --  18.5* 22.7* 22.5* 25.4*  HGB 8.3* 9.9* 7.6* 8.3* 10.2* 7.8*  HCT 26.2* 29.0* 23.4* 25.2* 31.5* 24.5*  MCV 81.6  --  80.4 78.8* 78.2* 79.3*  PLT 398  --  388 421* 510* 420*   CBG: Recent Labs  Lab 09/23/20 1632 09/23/20 2006 09/23/20 2334 09/24/20 0429 09/24/20 0752  GLUCAP 119* 173* 169* 114* 137*      Scheduled Meds:  amiodarone  200 mg Per Tube Daily   apixaban  5 mg Per Tube BID   buPROPion  75 mg Per Tube BID   chlorhexidine gluconate (MEDLINE KIT)  15 mL Mouth Rinse BID   Chlorhexidine Gluconate Cloth  6 each Topical Daily   Chlorhexidine Gluconate Cloth  6 each Topical Q0600   darbepoetin (ARANESP) injection - DIALYSIS  100 mcg Intravenous Q Tue-HD   feeding supplement (PROSource TF)  45 mL Per Tube TID   guaiFENesin  5 mL Per Tube BID   insulin aspart  0-9 Units Subcutaneous Q4H   insulin glargine-yfgn  30 Units Subcutaneous Daily   lidocaine  1 patch Transdermal Q24H   mouth rinse  15 mL Mouth Rinse 10 times per day   metaxalone  800 mg Per Tube TID   midodrine  5 mg Per Tube TID   multivitamin  1 tablet Per Tube QHS   pantoprazole sodium  40 mg Per Tube QHS   sertraline  100 mg Per Tube  Daily   simethicone  40 mg Per Tube QID   sodium chloride flush  10-40 mL Intracatheter Q12H   Continuous Infusions:  sodium chloride 10 mL/hr at 10/02/2020 1429   sodium chloride 500 mL (09/01/20 1408)   sodium chloride     sodium chloride     feeding supplement (NEPRO CARB STEADY) 1,000 mL (09/24/20 0551)    Active Problems:   Ventilator dependent (HCC)   Septic shock (HCC)   Pressure injury of skin   Acute respiratory failure with hypoxia (HCC)   AKI (acute kidney injury) ([place])   Hypotension   Goals of care, counseling/discussion   Quadriplegia ([place])   Chronic kidney disease requiring chronic dialysis ([place])   Hyperglycemia   Sepsis due to Acinetobacter baumannii ([place])   Pneumonia due to Acinetobacter species ([place])   Dysautonomia ([place])   Chronic midline thoracic back pain   Abdominal distention   Consultants: PCCM Nephrology Infectious disease Palliative care medicine  Procedures: Echocardiogram Insertion of double-lumen HD catheter  Antibiotics: Cefepime x1 dose Meropenem 6/29 through 7/2 Vancomycin 6/29 through 6/30 Unasyn 7/2 through 7/15 Unasyn 8/15 >8/22   Time spent: 35 minutes    Erin Hearing ANP  [facility] 7 am - 330 pm/M-F for direct patient care and secure chat Please refer to Amion for contact info 75  days

## 2020-09-24 NOTE — TOC Progression Note (Signed)
Transition of Care [facility]) - Progression Note    Patient Details  Name: LATRICE KAISER MRN: [id number] Date of Birth: [birth year]  Transition of Care [facility]) CM/SW [place], RN Phone Number: 09/24/2020, 11:58 AM  Clinical Narrative:    CM called and spoke with [facility] with Interventional Radiology to check on availability of PEG tube replacement.  Melanie in Interventional will check with availability of the tube and will speak to Dr. Kathlene Cote regarding this matter.  The patient has a pending bed offer at [facility] in [place] and the social worker at the facility is working on receiving documents from the patient's wife to transfer the patient's insurance provider to [place] medicaid.  CM and MSW with DTP Team will continue to follow the patient for Ventilator SNF placement.   Expected Discharge Plan: [place] Barriers to Discharge: Continued Medical Work up, No SNF bed, Vent Bed not available  Expected Discharge Plan and Services Expected Discharge Plan: [place]   Discharge Planning Services: CM Consult Post Acute Care Choice: [place] Living arrangements for the past 2 months: Post-Acute Facility                                       Social Determinants of Health (SDOH) Interventions    Readmission Risk Interventions No flowsheet data found.

## 2020-09-24 NOTE — Progress Notes (Signed)
[facility] NEPHROLOGY PROGRESS NOTE  Assessment/ Plan:  # Acute kidney Injury-prolonged dialysis dependency, now progressed to ESRD:  ATN from septic shock-without any evidence of renal recovery to date . initiated on CRRT 07/18/20 and transitioned to IHD.  Has TDC, continue with HD on TTS schedule for now.  Tolerating dialysis well but unable to sit on chair or recliner.  # Vascular access - had left forearm AVG placed 08/26/2020 but unfortunately had clotted.  s/p thrombectomy and revision 9/2, graft can be used 4 weeks thereafter.  Swelling of left upper extremity noted continue arm elevation and supportive care.  # Septic shock/ventilator associated pneumonia due to recurrent Acinetobacter infection: Off antibiotics and stable hemodynamics.  #  Chronic respiratory failure: Status post tracheostomy with ventilator management per CCM.  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  # Chronic kidney disease/metabolic bone disease: Calcium and phosphorus level under control with hemodialysis, off binders  # Anemia: contiue ESA, monitor Hb.  Transfuse as needed.  # Acute metabolic encephalopathy: Continue management as above.,  Supportive care.  # Disposition - pt cannot return to [facility] now that he is ESRD. He is going to [facility] in [place], [place]. He needs to have [place] Medicaid & his AVG needs to be mature prior to being accepted. Other possible barrier is pt is a permanent trach & vent pt.     Subjective: Seen and examined in ICU. Tolerated HD yesterday, net UF 2.5L. no acute events Objective Vital signs in last 24 hours: Vitals:   09/24/20 0400 09/24/20 0500 09/24/20 0600 09/24/20 0700  BP: (!) 80/52 111/65 122/67 109/68  Pulse: 91 92 93 89  Resp: _0 Temp:      TempSrc:      SpO2: 100% 100% 100% 100%  Weight:  80.1 kg    Height:       Weight change:   Intake/Output Summary (Last 24 hours) at 09/24/2020 0730 Last  data filed at 09/24/2020 0600 Gross per 24 hour  Intake 900 ml  Output 2725 ml  Net -1825 ml       Labs: Basic Metabolic Panel: Recent Labs  Lab 09/18/20 1106 09/19/20 0017 09/20/20 0834 09/23/20 0755 09/24/20 0425  NA 130*   < > 133* 129* 130*  K 3.3*   < > 3.7 4.3 3.9  CL 95*   < > 95* 92* 95*  CO2 24   < > _1 GLUCOSE 119*   < > 133* 146* 165*  BUN 66*   < > 79* 101* 59*  CREATININE 1.16   < > 1.32* 1.56* 1.11  CALCIUM 9.5   < > 9.4 9.7 9.0  PHOS 3.8  --  4.5  --  3.6   < > = values in this interval not displayed.   Liver Function Tests: Recent Labs  Lab 09/18/20 1106 09/20/20 0834 09/24/20 0425  ALBUMIN 1.8* 1.6* 1.5*   No results for input(s): LIPASE, AMYLASE in the last 168 hours. No results for input(s): AMMONIA in the last 168 hours. CBC: Recent Labs  Lab 09/19/20 0500 09/19/20 1607 09/20/20 0335 09/23/20 0755 09/23/20 1542 09/24/20 0425  WBC 23.8*  --  18.5* 22.7* 22.5* 25.4*  HGB 8.3*   < > 7.6* 8.3* 10.2* 7.8*  HCT 26.2*   < > 23.4* 25.2* 31.5* 24.5*  MCV 81.6  --  80.4 78.8* 78.2* 79.3*  PLT 398  --  388 421* 510* 420*   < > =  values in this interval not displayed.   Cardiac Enzymes: No results for input(s): CKTOTAL, CKMB, CKMBINDEX, TROPONINI in the last 168 hours. CBG: Recent Labs  Lab 09/23/20 1310 09/23/20 1632 09/23/20 2006 09/23/20 2334 09/24/20 0429  GLUCAP 128* 119* 173* 169* 114*    Iron Studies: No results for input(s): IRON, TIBC, TRANSFERRIN, FERRITIN in the last 72 hours.  Studies/Results: No results found.  Medications: Infusions:  sodium chloride 10 mL/hr at 09/26/2020 1429   sodium chloride 500 mL (09/01/20 1408)   sodium chloride     sodium chloride     feeding supplement (NEPRO CARB STEADY) 1,000 mL (09/24/20 0551)    Scheduled Medications:  amiodarone  200 mg Per Tube Daily   apixaban  5 mg Per Tube BID   buPROPion  75 mg Per Tube BID   chlorhexidine gluconate (MEDLINE KIT)  15 mL Mouth Rinse BID    Chlorhexidine Gluconate Cloth  6 each Topical Daily   Chlorhexidine Gluconate Cloth  6 each Topical Q0600   darbepoetin (ARANESP) injection - DIALYSIS  100 mcg Intravenous Q Tue-HD   feeding supplement (PROSource TF)  45 mL Per Tube TID   guaiFENesin  5 mL Per Tube BID   insulin aspart  0-9 Units Subcutaneous Q4H   insulin glargine-yfgn  30 Units Subcutaneous Daily   lidocaine  1 patch Transdermal Q24H   mouth rinse  15 mL Mouth Rinse 10 times per day   metaxalone  800 mg Per Tube TID   midodrine  5 mg Per Tube TID   multivitamin  1 tablet Per Tube QHS   pantoprazole sodium  40 mg Per Tube QHS   sertraline  100 mg Per Tube Daily   simethicone  40 mg Per Tube QID   sodium chloride flush  10-40 mL Intracatheter Q12H    have reviewed scheduled and prn medications.  Physical Exam:  General:NAD, arousable, tracheostomy on vent. Heart:RRR, s1s2 nl Lungs: Coarse breath sound bilateral, no increased work of breathing. Abdomen:soft, Non-tender, PEG tube and ostomy bag Extremities:Trace lower extremities edema Dialysis Access: LIJ TDC, LUE AVG T/B+, swelling of the left upper extremity noted.  Starlina Lapre 09/24/2020,7:30 AM  LOS: 75 days

## 2020-09-24 NOTE — Progress Notes (Signed)
Patient was transported to CT & back to room 2M16 on ventilator with no problems.

## 2020-09-24 NOTE — Progress Notes (Signed)
NAME:  Luis Orr, MRN:  [id number], DOB:  [birth year], LOS: 57 ADMISSION DATE:  07/10/2020, CONSULTATION DATE:  06/17/2020 REFERRING MD:  Dr Francia Greaves, EDP CHIEF COMPLAINT:  septic shock    History of Present Illness:  70 yo male from [facility] with hx of C3-C5 compressive myelopathy with functional quadriplegia and chronic trach/vent presented to ED with altered mental status and hypotension with recurrent HCAP.  Has multiple recent admissions for sepsis secondary to various infections including UTIs, pneumonias and bacteremia in setting of MDR Klebsiella, MRSA and pseudomonas. Most recent admission September 2021 in setting of sepsis due to MDR Klebsiella with right ureteral calculus s/p double J ureteral stent placement and treated with meropenem x 10days.  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 Central line placed. Drainage from gastrostomy tube >> CT Abd without fluid collection around gastrostomy tube but with diffuse body wall edema. Echo EF 50-55%, mild LVH  7/6 Renal Replacement Therapy ; Code status changed to partial Code (DNR) 7/7 Transfuse 1U PRBC 7/9 add solu cortef 7/10 off pressors, weaning stress steroids  7/11 Pressors added back on again 7/12 ID consult due to rising WBC, hypothermia, hypotension concern for worsening infection. Vancomycin added. Repeat blood cx collected. 7/15 CRRT stopped   7/17 Restarted on low dose levo. No evidence of new infection. Held off on antibiotics. Nephrology contacted family and decision was made to continue iHD this week to allow additional time for renal recovery. Patient is not a long term iHD candidate  7/18 plans for iHD, tolerated with 2L removed  7/19 slight drop in hgb to 6.9 will transfuse 1 unit PRBC  7/20 To [facility], PCCM following for trach / vent needs 7/22>> tolerating HD, will need out of state placement 8/4 last PCCM visit. 8/4 -8/8: No issues. CM workin gon out of state placement  8/15 no acute changes. Continues on PRVC, was apneic with PSV attempt. Did wean to 40% FiO2  8/29 Oral bleeding from biting lip/tongue, subsequent bloody trach secretions  8/30 Left FA AVG occlueded 9/2 s/p thrombectomy and revision of LUE AVG 9/4 LUE with neg venous dupplex 9/5 heparin stopped, resumed Eliquis  9/6 iHD with 2L UF 9/7 changed from [facility] to PCV given high plat pressures 9/10 remains on the ventilator, unable to wean   Interim History / Subjective:   No acute events overnight.  Has not made much progress on the ventilator.  Continues on hemodialysis  Objective   Blood pressure 109/68, pulse 89, temperature 97.9 F (36.6 C), temperature source Axillary, resp. rate 20, height '5\' 6"'$  (1.676 m), weight 80.1 kg, SpO2 100 %.    Vent Mode: PCV FiO2 (%):  [40 %] 40 % Set Rate:  [20 bmp] 20 bmp PEEP:  [5 cmH20] 5 cmH20 Plateau Pressure:  [18 cmH20-31 cmH20] 26 cmH20   Intake/Output Summary (Last 24 hours) at 09/24/2020 0746 Last data filed at 09/24/2020 0600 Gross per 24 hour  Intake 900 ml  Output 2725 ml  Net -1825 ml   Filed Weights   09/23/20 1345 09/23/20 1705 09/24/20 0500  Weight: 83.2 kg 81.5 kg 80.1 kg    Physical Exam: Gen:      No acute distress, chronically ill-appearing HEENT:  EOMI, sclera anicteric, tracheostomy Neck:     No masses; no thyromegaly Lungs:    Clear to auscultation bilaterally; normal respiratory  effort CV:         Regular rate and rhythm; no murmurs Abd:      + bowel sounds; soft, non-tender; no palpable masses, no distension Ext:    No edema; adequate peripheral perfusion Skin:      Warm and dry; no rash Neuro: Sedated, opens eyes to command  Lab/imaging reviewed Significant for creatinine 1.11, WBC count remains elevated at 25.4 Hemoglobin 7.8  Resolved Hospital Problem list   Hyperkalemia Tachycardia Hypotension Thrombocytosis  AKI Septic shock 2nd to HCAP and UTI all present on admission Acinetobacter in sputum culture from 07/12/20. Completed 14 days of Unasyn for -Of pressors on 4/14. Restarted  levo 7/17. Acute on chronic respiratory failure   Assessment & Plan:   C3-C5 compressive Myelopathy w/ resultant Chronic Respiratory failure, Vent and Trach dependence  Acinetobacter Baumannii PNA Not weaning.  Abx completed 8/22. - continue PCV given high plat's, monitor TVs closely to match his 8cc which is ~510 ml - Continue mucinex and prn glycopyrrolate for secretions - VAP prevention protocol/ PPI - PAD protocol for sedation> minimize as able, prn oxy IR/ fentanyl  - intermittent CXR  - prn duonebs - continue trach care per protocol - volume removal per iHD    - remainder per primary team/ [facility] - remains limited DNR (drugs and vent only) in the event of decompensation - pt continues to await PEG and LTAC placement, not able to return to [facility] given ESRD  PCCM will see weekly.  Please call if problems arise sooner.   Marshell Garfinkel MD [facility] See Amion for pager  If no response to pager , please call [phone] until 7pm After 7:00 pm call Elink  [phone] 09/24/2020, 8:05 AM

## 2020-09-25 DIAGNOSIS — A4159 Other Gram-negative sepsis: Secondary | ICD-10-CM | POA: Diagnosis not present

## 2020-09-25 DIAGNOSIS — Z9911 Dependence on respirator [ventilator] status: Secondary | ICD-10-CM | POA: Diagnosis not present

## 2020-09-25 DIAGNOSIS — G8250 Quadriplegia, unspecified: Secondary | ICD-10-CM | POA: Diagnosis not present

## 2020-09-25 DIAGNOSIS — A419 Sepsis, unspecified organism: Secondary | ICD-10-CM | POA: Diagnosis not present

## 2020-09-25 LAB — GLUCOSE, CAPILLARY
Glucose-Capillary: 138 mg/dL — ABNORMAL HIGH (ref 70–99)
Glucose-Capillary: 105 mg/dL — ABNORMAL HIGH (ref 70–99)
Glucose-Capillary: 115 mg/dL — ABNORMAL HIGH (ref 70–99)
Glucose-Capillary: 125 mg/dL — ABNORMAL HIGH (ref 70–99)
Glucose-Capillary: 118 mg/dL — ABNORMAL HIGH (ref 70–99)
Glucose-Capillary: 135 mg/dL — ABNORMAL HIGH (ref 70–99)

## 2020-09-25 LAB — CBC
HCT: 24.6 % — ABNORMAL LOW (ref 39.0–52.0)
Hemoglobin: 8 g/dL — ABNORMAL LOW (ref 13.0–17.0)
MCH: 25.1 pg — ABNORMAL LOW (ref 26.0–34.0)
MCHC: 32.5 g/dL (ref 30.0–36.0)
MCV: 77.1 fL — ABNORMAL LOW (ref 80.0–100.0)
Platelets: 426 10*3/uL — ABNORMAL HIGH (ref 150–400)
RBC: 3.19 MIL/uL — ABNORMAL LOW (ref 4.22–5.81)
RDW: 19.8 % — ABNORMAL HIGH (ref 11.5–15.5)
WBC: 19.6 10*3/uL — ABNORMAL HIGH (ref 4.0–10.5)
nRBC: 0.5 % — ABNORMAL HIGH (ref 0.0–0.2)

## 2020-09-25 LAB — HEPATITIS B SURFACE ANTIGEN: Hepatitis B Surface Ag: NONREACTIVE

## 2020-09-25 NOTE — Progress Notes (Signed)
[facility] NEPHROLOGY PROGRESS NOTE  Assessment/ Plan:  # Acute kidney Injury-prolonged dialysis dependency, now progressed to ESRD:  ATN from septic shock-without any evidence of renal recovery to date . initiated on CRRT 07/18/20 and transitioned to IHD.  Has TDC, continue with HD on TTS schedule for now.  Tolerating dialysis well but unable to sit on chair or recliner.  # Vascular access - had left forearm AVG placed 08/15/2020 but unfortunately had clotted.  s/p thrombectomy and revision 9/2, graft can be used 4 weeks thereafter.  Swelling of left upper extremity noted continue arm elevation and supportive care.  # Septic shock/ventilator associated pneumonia due to recurrent Acinetobacter infection: Off antibiotics and stable hemodynamics.  #  Chronic respiratory failure: Status post tracheostomy with ventilator management per CCM.  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  # Chronic kidney disease/metabolic bone disease: Calcium and phosphorus level under control with hemodialysis, off binders  # Anemia: contiue ESA, monitor Hb.  Transfuse as needed.  # Acute metabolic encephalopathy: Continue management as above.,  Supportive care.  # Disposition - pt cannot return to [facility] now that he is ESRD. He is going to [facility] in [place], [place]. He needs to have [place] Medicaid & his AVG needs to be mature prior to being accepted. Other possible barrier is pt is a permanent trach & vent pt.     Subjective: Seen and examined in ICU. Abd pain yesterday, s/p CT a/p w/o contrast 9/12: large sacral decub w/ erosion of sacrum with gas tracking in rt gluteus max muscle Objective Vital signs in last 24 hours: Vitals:   09/25/20 0341 09/25/20 0400 09/25/20 0500 09/25/20 0600  BP:  140/80 137/79 (!) 172/90  Pulse:  80 77 86  Resp:  20 20 20   Temp:      TempSrc:      SpO2: 100% 100% 100% 95%  Weight:   81.4 kg   Height:       Weight  change: -1.8 kg  Intake/Output Summary (Last 24 hours) at 09/25/2020 0754 Last data filed at 09/25/2020 0600 Gross per 24 hour  Intake 1035 ml  Output 300 ml  Net 735 ml       Labs: Basic Metabolic Panel: Recent Labs  Lab 09/18/20 1106 09/19/20 0017 09/20/20 0834 09/23/20 0755 09/24/20 0425  NA 130*   < > 133* 129* 130*  K 3.3*   < > 3.7 4.3 3.9  CL 95*   < > 95* 92* 95*  CO2 24   < > 24 23 23   GLUCOSE 119*   < > 133* 146* 165*  BUN 66*   < > 79* 101* 59*  CREATININE 1.16   < > 1.32* 1.56* 1.11  CALCIUM 9.5   < > 9.4 9.7 9.0  PHOS 3.8  --  4.5  --  3.6   < > = values in this interval not displayed.   Liver Function Tests: Recent Labs  Lab 09/18/20 1106 09/20/20 0834 09/24/20 0425  ALBUMIN 1.8* 1.6* 1.5*   No results for input(s): LIPASE, AMYLASE in the last 168 hours. No results for input(s): AMMONIA in the last 168 hours. CBC: Recent Labs  Lab 09/20/20 0335 09/23/20 0755 09/23/20 1542 09/24/20 0425 09/25/20 0347  WBC 18.5* 22.7* 22.5* 25.4* 19.6*  HGB 7.6* 8.3* 10.2* 7.8* 8.0*  HCT 23.4* 25.2* 31.5* 24.5* 24.6*  MCV 80.4 78.8* 78.2* 79.3* 77.1*  PLT 388 421* 510* 420* 426*   Cardiac  Enzymes: No results for input(s): CKTOTAL, CKMB, CKMBINDEX, TROPONINI in the last 168 hours. CBG: Recent Labs  Lab 09/24/20 1204 09/24/20 1630 09/24/20 1939 09/24/20 2340 09/25/20 0349  GLUCAP 138* 101* 113* 152* 138*    Iron Studies: No results for input(s): IRON, TIBC, TRANSFERRIN, FERRITIN in the last 72 hours.  Studies/Results: CT ABDOMEN PELVIS WO CONTRAST  Result Date: 09/24/2020 CLINICAL DATA:  Abdominal pain, acute, nonlocalized persistent leukocytosis EXAM: CT ABDOMEN AND PELVIS WITHOUT CONTRAST TECHNIQUE: Multidetector CT imaging of the abdomen and pelvis was performed following the standard protocol without IV contrast. COMPARISON:  07/26/2020 FINDINGS: Lower chest: Moderate multi-vessel coronary artery calcification. Global cardiac size within normal  limits. No pericardial effusion. Bilateral lower lobe collapse and consolidation with traction bronchiectasis is unchanged trace left pleural effusion is present, improved since prior examination. Hepatobiliary: Cholelithiasis without pericholecystic inflammatory change. Liver unremarkable. No intra or extrahepatic biliary ductal dilation. Pancreas: Unremarkable Spleen: Unremarkable Adrenals/Urinary Tract: The adrenal glands are unremarkable. The kidneys are normal in position. Severe right renal cortical atrophy is unchanged. Right double-J ureteral stent extending from the right upper polar cortex into the bladder is unchanged. Compensatory hypertrophy of the left kidney. Multiple simple cortical cysts are seen within the left kidney measuring up to 8.5 cm in dimension. 13 mm nonobstructing calculus noted within the lower pole of the left kidney is unchanged. No ureteral calculi. No hydronephrosis. Bladder is otherwise unremarkable. Stomach/Bowel: Balloon retention gastrostomy catheter is in place within the a gastric antrum with the retaining balloon partially withdrawn into the tract, progressive since prior examination. This is best seen on image # 29/3. 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 There is persistent nodular, irregular fold thickening involving the ascending colon just distal to the ileocecal valve in this region, there is background asymmetric mild-to-moderate diverticulosis and, together, these may reflect subacute changes of diverticulitis. Irregularity, however, raises the question of a infiltrative neoplasm in this location. No evidence of obstruction or focal inflammation. Appendix normal. No free intraperitoneal gas or fluid. Vascular/Lymphatic: Mild aortoiliac atherosclerotic calcification. No aortic aneurysm. No pathologic adenopathy within the abdomen and pelvis. Reproductive: Prostate is unremarkable. Other: Large sacral decubitus ulcer is  again identified with erosive changes involving the distal sacrum, best appreciated on sagittal reformat # 115/7. There is debris or surgical packing material within the ulcer cavity. Gas is seen tracking into the right gluteus maximus muscle. No discrete drainable fluid collection. Diastasis of the rectus abdominus again noted. Tiny fat containing right inguinal hernia. Musculoskeletal: Degenerative changes are seen within the lumbar spine. No lytic or blastic bone lesions are identified. IMPRESSION: Large sacral decubitus ulcer again identified with erosion of the terminal sacrum and extensive material within the ulcer cavity likely representing surgical packing material. Gas is seen tracking into the right gluteus maximus muscle, however, there is no associated discrete drainable fluid collection identified. Bilateral lower lobes collapse and consolidation, unchanged. Improving bilateral pleural effusions. Cholelithiasis. Stable marked atrophy of the right kidney with unchanged right double-J ureteral stent in place. Multiple simple left renal cortical cysts measuring up to 8.5 cm, unchanged. Stable left nonobstructing nephrolithiasis. No hydronephrosis. No urolithiasis. Balloon retention gastrostomy catheter with the retaining balloon partially withdrawn into the gastrostomy catheter tract. Stable nodular irregular thickening of the ascending colon, stable since immediate prior examinations, but new since remote prior examination of 10/07/2019. While this may represent subacute inflammatory change, an infiltrative neoplasm could appear in this fashion and correlation with endoscopy may be helpful once  the patient's acute issues have resolved. Aortic Atherosclerosis (ICD10-I70.0). Electronically Signed   By: Fidela Salisbury M.D.   On: 09/24/2020 16:32   DG CHEST PORT 1 VIEW  Result Date: 09/24/2020 CLINICAL DATA:  Leukocytosis, diabetes, hypertension EXAM: PORTABLE CHEST 1 VIEW COMPARISON:  07/19/2020  FINDINGS: Tracheostomy tip seen 6.7 cm above the carina. Nasoenteric feeding tube extends into the upper abdomen beyond the margin of the examination. Left internal jugular central venous catheter tip noted within the superior vena cava. Right internal jugular hemodialysis catheter tip noted within the superior cavoatrial junction. Bibasilar pulmonary infiltrates or atelectasis persist. Small bilateral pleural effusions are suspected. No pneumothorax. Cardiac size is within normal limits. No acute bone abnormality. IMPRESSION: Stable support lines and tubes. Stable bibasilar atelectasis or infiltrate. Small bilateral pleural effusions, grossly unchanged. Electronically Signed   By: Fidela Salisbury M.D.   On: 09/24/2020 10:47    Medications: Infusions:  sodium chloride 10 mL/hr at 10/01/2020 1429   sodium chloride 500 mL (09/01/20 1408)   sodium chloride     sodium chloride     cefTRIAXone (ROCEPHIN)  IV 2 g (09/24/20 2154)   feeding supplement (NEPRO CARB STEADY) 1,000 mL (09/25/20 0609)    Scheduled Medications:  amiodarone  200 mg Per Tube Daily   apixaban  5 mg Per Tube BID   buPROPion  75 mg Per Tube BID   chlorhexidine gluconate (MEDLINE KIT)  15 mL Mouth Rinse BID   Chlorhexidine Gluconate Cloth  6 each Topical Daily   Chlorhexidine Gluconate Cloth  6 each Topical Q0600   darbepoetin (ARANESP) injection - DIALYSIS  100 mcg Intravenous Q Tue-HD   feeding supplement (PROSource TF)  45 mL Per Tube TID   guaiFENesin  5 mL Per Tube BID   insulin aspart  0-9 Units Subcutaneous Q4H   insulin glargine-yfgn  30 Units Subcutaneous Daily   lidocaine  1 patch Transdermal Q24H   mouth rinse  15 mL Mouth Rinse 10 times per day   metaxalone  800 mg Per Tube TID   metroNIDAZOLE  500 mg Per Tube BID   midodrine  5 mg Per Tube TID   multivitamin  1 tablet Per Tube QHS   pantoprazole sodium  40 mg Per Tube QHS   sertraline  100 mg Per Tube Daily   simethicone  40 mg Per Tube QID   sodium chloride  flush  10-40 mL Intracatheter Q12H    have reviewed scheduled and prn medications.  Physical Exam:  General:NAD, arousable, tracheostomy on vent. Heart:RRR, s1s2 nl Lungs: trach, no increased work of breathing. Abdomen:soft, Non-tender, PEG tube and ostomy bag Extremities:Trace lower extremities edema Dialysis Access: LIJ TDC, LUE AVG T/B+, swelling of the left upper extremity noted (stable)  Kaisy Severino 09/25/2020,7:54 AM  LOS: 76 days

## 2020-09-25 NOTE — Progress Notes (Addendum)
[facility] PROGRESS NOTE  Luis Orr [id number] DOB: 1950-07-04 DOA: 06/24/2020 PCP: Townsend Roger, MD  Status: Remains inpatient appropriate because:Hemodynamically unstable, Persistent severe electrolyte disturbances, Unsafe d/c plan, and Inpatient level of care appropriate due to severity of illness  Dispo: The patient is from: [facility] vent capable              Anticipated d/c is to: SNF Vent capable w/ access to in facility/stretcher based HD-a facility in [place] has been located in preauthorization in progress.              Patient currently is not medically stable to d/c.   Difficult to place patient Yes    Level of care: ICU  Code Status: Partial (no CPR and no defibrillation or cardioversion) Family Communication: Wife June on 8/22.  Of note the wife is also the POA DVT prophylaxis: IV Heparin COVID vaccination status: Unknown    HPI:  70 y.o. 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 Patient presented secondary to altered mental status and hypotension with recurrent pneumonia requiring vasopressor support and ICU admission.he has had multiple recent admissions for sepsis secondary to various infections including UTIs, pneumonias and bacteremia in setting of MDR Klebsiella, MRSA and pseudomonas. Most recent admission September 2021 in setting of sepsis due to MDR Klebsiella with right ureteral calculus s/p double J ureteral stent placement and treated with meropenem x 10days. He was discharged back to [facility].  ICU significant events: 6/29 admitted to ICU for septic shock on levo to maintain MAP>65, continued on vent support 6/29 Blood Cx>> staph species in 1 of 4 cultures drawn>> suspect contaminant 6/30 Off pressors 7/2 ID Consult for MDR acinetobacter in trach asp, Meropenem changed to unasyn 7/2 PRBC transfusion, iron replacement 7/5 Pressors added again. Central line placed.  Drainage from gastrostomy tube >> CT Abd without fluid collection around gastrostomy tube but with diffuse body wall edema. Echo EF 50-55%, mild LVH 7/6 Renal Replacement Therapy ; Code status changed to partial Code (DNR) 7/7 Transfuse 1U PRBC 7/9 add solu cortef 7/10 off pressors, weaning stress steroids 7/11 Pressors added back on again 7/12 ID consult due to rising WBC, hypothermia, hypotension concern for worsening infection. Vancomycin added. Repeat blood cx collected. 7/15 CRRT stopped 7/17 Restarted on low dose levo. No evidence of new infection. Held off on antibiotics. Nephrology contacted family and decision was made to continue iHD this week to allow additional time for renal recovery. Patient is not the best long term iHD candidate but family wants aggressive care 7/18 plans for iHD, tolerated with 2L removed 719 slight drop in hgb to 6.9 will transfuse 1 unit PRBC 7/22 > 7/26: Tolerating HD. TOC consulted for placement. Will likely need out of state placement. 8/15 WBCs greater than 28,000, hypoglycemia, procalcitonin >18, chest x-ray consistent with pneumonia, recent blood cultures consistent with contamination.  Unasyn IV resumed after sputum culture obtained 8/16 consultation placed with ethics committee regarding evaluation for futility of care 8/16 Sputum cx + for Acinetobacter with sensitivities pending 8/17 extensive conversation held with patient's wife regarding patient's poor prognosis and reemergence of Acinetobacter VAP.  Also discussed with her that patient has been able to convey to multiple staff that he does not want to continue on the ventilator or with dialysis.  Wife reports that the patient does not understand what he is saying, she is the POA and that she wants to continue aggressive care. 8/18 patient  now telling nephrology, myself and the rest of the staff he replies yes to wanting to remain on dialysis and on the ventilator and this direct question is  asked. 8/18 Acinetobacter sensitive to Unasyn only.  Discussed with ID/Dr. Juleen China.  Plan is to administer antibiotics for 7-day since patient has not had any increased O2 needs and remains afebrile.  If any concerns over worsening symptoms can give an additional 3 days. Developed moderate bleeding around tracheostomy site on 8/19.  Likely related to combination of Eliquis and heparin required for CRRT.  We will hold Eliquis for now and monitor degree of bleeding Eliquis resumed on 8/22.  Overnight into 8/23 patient bit his tongue and had bleeding.  Also had some bleeding around trach likely from oral bleeding.  Also though did have bleeding from old central line site. 8/23 had transient bleeding around trach and prior central line insertion site overnight.  Patient had bitten tongue and this likely explain the bleeding.  This morning patient was not having any bleeding.  On 8/25 had redeveloped slow ooze bleeding from mouth and trach.  Eliquis discontinued in favor of IV heparin especially given patient will undergo surgical procedure for vascular access on 8/29 8/30 Left FA AVG occluded 9/2 status post thrombectomy and revision of LUE AV graft 9/4 LUE edema with negative venous duplex. 9/5 heparin discontinued and Eliquis resumed    Subjective: Alert.  Appears to be miserable and uncomfortable.  When I asked him if he was tired of "all of this" he shook his head yes.  I told him that his wife June is very hopeful he will get better and that he will have her closer to her him once he transitions to the facility in [place].  Objective: Vitals:   09/25/20 0500 09/25/20 0600  BP: 137/79 (!) 172/90  Pulse: 77 86  Resp: 20 20  Temp:    SpO2: 100% 95%    Intake/Output Summary (Last 24 hours) at 09/25/2020 7001 Last data filed at 09/25/2020 0600 Gross per 24 hour  Intake 1035 ml  Output 300 ml  Net 735 ml   Filed Weights   09/23/20 1705 09/24/20 0500 09/25/20 0500  Weight: 81.5 kg 80.1 kg  81.4 kg    Exam:  Constitutional: Awake, flat affect, looks miserable and nodded yes when I asked him if he felt bad Respiratory: Trach: 6.0 XLT cuffed, Vent settings: PRVC mode, Fio2 40%, rate 20, VT 510, PEEP 5; Lungs remain coarse bilaterally overall clear.  Tolerating vent settings well. Cardiovascular: SR with out ectopy, normotensive, persistent upper extremity edema worse on left in the bicep region.  LUE AVG palpable thrill. Abdomen: LBM 9/12, PEG tube functional.  Left lower quadrant colostomy in place, abdomen distended but nontender with normoactive bowel sounds.  Neurologic: Chronic quadriplegia so unable to independently move extremities.  No sensation below shoulders. 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-ischial symptoms secondary to combination Acinetobacter pneumonia and volume overload Redeveloped Acinetobacter pneumonia (8/14) and completed 7 days of Unasyn Ventilator management per PCCM  Recurrent ventilator associated pneumonia 2/2 recurrent Acinetobacter/sepsis without shock WBCs stable with positive sputum culture sensitive only to Unasyn ID/Dr Juleen China recommended 7 days of therapy -completed on 8/22   AKI with oliguria/volume overload/ New CKD requiring HD Anuric Nephrology following, initially required CRRT and has transitioned to HD (due to staffing issues continues on CRRT at bedside)  LUE AVG functional   Septic shock 2/2 VAP POA Presented with sepsis physiology and profound shock which has now resolved  Chronic thoracic back pain Continue Oxy and fentanyl prn; Skelaxin and lidocaine patch  History of paroxysmal atrial fibrillation Patient was on amiodarone prior to admission -maintaining SR AVG functional heparin discontinued on 9 5 and Eliquis resumed  TSH was WNL this admission QTc 420 ms on 6/38    Acute metabolic encephalopathy Resolved Secondary to acute illness. CT head negative for acute process.  Back at baseline.   Diabetes mellitus, type 2 Continue Semglee 30 units daily.  Continue SSI w/ CBG checks every 4 hours  Persistent leukocytosis He has had waxing and waning elevations in WBC without fever that is chronic in nature and has included persistent abdominal distention and intermittent abdominal pain CT abdomen and pelvis with oral contrast only completed on 9/12 without any acute abdominal issue  Nodular irregular thickening of the ascending colon/suspected diverticulitis Documented as stable compared with previous exam on 10/07/2019 Radiologist documented that while this may represent subacute inflammatory change such as diverticulitis and infiltrative neoplasm could appear in this fashion and correlation with endoscopy may be helpful once the patient's acute issues have resolved Flagyl initiated on 9/12 [facility] consulted.  They have examined the patient and reviewed his chart thoroughly.  Have also spoken with his wife.  Given patient's debilitated state as well as his apparent acute diverticulitis they recommend holding off on colonoscopy for now.  I have signed off for now would recommend calling for any questions.   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  Resolved.  Quadriplegia secondary to compressive myelopathy.  [facility] will not accept him back due to HD dependent and inability to sit up in chair for dialysis treatment   Microcytic anemia/Acute anemia superimposed on anemia of chronic kidney disease S/P 5 units of PRBC  Continue Aranesp and follow Hgb prn   Severe protein calorie malnutrition PEG has been replaced and is functional Discontinue  Cortark  Pressure injury/stage IV sacral decubitus Stage IV medial/posterior sacrum present on admission.  Right/posterior/lateral ankle unknown if present on admission.  DTI left ear, not present on admission. (For additional documentation see wound care notes)    Data Reviewed: Basic Metabolic Panel: Recent Labs  Lab 09/18/20 1106 09/19/20 0017 09/19/20 0500 09/19/20 1607 09/20/20 0834 09/23/20 0755 09/24/20 0425  NA 130*   < > 133* 136 133* 129* 130*  K 3.3*   < > 3.6 3.9 3.7 4.3 3.9  CL 95*  --  96*  --  95* 92* 95*  CO2 24  --  25  --  $R'24 23 23  'Yq$ GLUCOSE 119*  --  136*  --  133* 146* 165*  BUN 66*  --  51*  --  79* 101* 59*  CREATININE 1.16  --  0.98  --  1.32* 1.56* 1.11  CALCIUM 9.5  --  9.4  --  9.4 9.7 9.0  MG  --   --  2.2  --   --   --   --   PHOS 3.8  --   --   --  4.5  --  3.6   < > = values in this interval not displayed.   Liver Function Tests: Recent Labs  Lab 09/18/20 1106 09/20/20 0834 09/24/20 0425  ALBUMIN 1.8* 1.6* 1.5*    CBC: Recent Labs  Lab 09/20/20 0335 09/23/20 0755 09/23/20 1542 09/24/20 0425 09/25/20 0347  WBC 18.5* 22.7* 22.5* 25.4* 19.6*  HGB 7.6* 8.3* 10.2* 7.8* 8.0*  HCT 23.4* 25.2* 31.5* 24.5* 24.6*  MCV 80.4 78.8* 78.2* 79.3* 77.1*  PLT 388 421* 510* 420* 426*   CBG: Recent Labs  Lab 09/24/20 1204 09/24/20 1630 09/24/20 1939 09/24/20 2340 09/25/20 0349  GLUCAP 138* 101* 113* 152* 138*      Scheduled Meds:  amiodarone  200 mg Per Tube Daily   apixaban  5 mg Per Tube BID   buPROPion  75 mg Per Tube BID   chlorhexidine gluconate (MEDLINE KIT)  15 mL Mouth Rinse BID   Chlorhexidine Gluconate Cloth  6 each Topical Daily   Chlorhexidine Gluconate Cloth  6 each Topical Q0600   darbepoetin (ARANESP) injection - DIALYSIS  100 mcg Intravenous Q Tue-HD   feeding supplement (PROSource TF)  45 mL Per Tube TID   guaiFENesin  5 mL Per Tube BID   insulin aspart  0-9 Units Subcutaneous Q4H   insulin glargine-yfgn  30  Units Subcutaneous Daily   lidocaine  1 patch Transdermal Q24H   mouth rinse  15 mL Mouth Rinse 10 times per day   metaxalone  800 mg Per Tube TID   metroNIDAZOLE  500 mg Per Tube BID   midodrine  5 mg Per Tube TID   multivitamin  1 tablet Per Tube QHS   pantoprazole sodium  40 mg Per Tube QHS   sertraline  100 mg Per Tube Daily   simethicone  40 mg Per Tube QID   sodium chloride flush  10-40 mL Intracatheter Q12H   Continuous Infusions:  sodium chloride 10 mL/hr at 09/19/2020 1429   sodium chloride 500 mL (09/01/20 1408)   sodium chloride     sodium chloride     cefTRIAXone (ROCEPHIN)  IV 2 g (09/24/20 2154)   feeding supplement (NEPRO CARB STEADY) 1,000 mL (09/25/20 0609)    Active Problems:   Ventilator dependent (HCC)   Septic shock (HCC)   Pressure injury of skin   Acute respiratory failure with hypoxia (HCC)   AKI (acute kidney injury) (HCC)   Hypotension   Goals of care, counseling/discussion   Quadriplegia (HCC)   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)   Hyperglycemia   Sepsis due to Acinetobacter baumannii ([place])   Pneumonia due to Acinetobacter species ([place])   Dysautonomia (HCC)   Chronic midline thoracic back pain   Abdominal distention   Leukocytosis   Consultants: PCCM Nephrology Infectious disease Palliative care medicine  Procedures: Echocardiogram Insertion of double-lumen HD catheter  Antibiotics: Cefepime x1 dose Meropenem 6/29 through 7/2 Vancomycin 6/29 through 6/30 Unasyn 7/2 through 7/15 Unasyn 8/15 >8/22   Time spent: 35 minutes    Erin Hearing ANP  [facility] 7 am - 330 pm/M-F for direct patient care and secure chat Please refer to Amion for contact info 76  days

## 2020-09-25 NOTE — TOC Progression Note (Signed)
Transition of Care [facility]) - Progression Note    Patient Details  Name: Luis Orr MRN: [id number] Date of Birth: June 18, 1950  Transition of Care [facility]) CM/SW [place], RN Phone Number: 09/25/2020, 1:02 PM  Clinical Narrative:    Case management spoke with Sondra Barges, MSW with [facility] and the facility needs more bank documents from the patient's wife to complete the [facility] Medicaid application.  I called the patient's wife on the phone and left a message with her to call the [facility] facility and speak with the MSW to deliver the required documents.    The patient's Cortrak was discontinued yesterday and Left arm AVF is present, as requested by the Vent/SNF facility.  CM and MSW with DTP Team will continue to follow the patient for SNF placement.   Expected Discharge Plan: [place] Barriers to Discharge: Continued Medical Work up, No SNF bed, Vent Bed not available  Expected Discharge Plan and Services Expected Discharge Plan: [place]   Discharge Planning Services: CM Consult Post Acute Care Choice: [place] Living arrangements for the past 2 months: Post-Acute Facility                                       Social Determinants of Health (SDOH) Interventions    Readmission Risk Interventions No flowsheet data found.

## 2020-09-25 NOTE — Consult Note (Signed)
[facility] Consult: 10:21 AM 09/25/2020  LOS: 76 days    Referring Provider: Dr Tyrell Antonio  Primary Care Physician:  Nona Dell, Corene Cornea, MD in [place]. Primary Gastroenterologist:  unassigned    Reason for Consultation:  abnormal Right colon on CT   HPI: Luis Orr is a 70 y.o. male.  PMH baseline quadriplegia, bedbound, [facility] pt.  Vent dependent, chronic trach.  Chronic PEG tube.  Chronic Foley.  DM 2.  Gout.  Decubitus ulcers.  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  Protein calorie malnutrition.  09/2019 admission with sepsis, MDR Klebsiella, right ureteral calculus treated with double-J ureteral stent placement.  Ileus 09/2019.  Fecal impaction with CT suggesting stercoral colitis in 06/2019.   Pt has ostomy creation in abdomen but no details as to what led up to this or the specific surgery.  Unknown previous colonoscopy or EGD, no records of these in chart.  Now on day 76 hospitalization presenting with multifocal pneumonia, septic shock, metabolic encephalopathy.  Growing Acinetobacter from trach aspirant.  MRSA detected on PCR panel.  AKI and hyperkalemia treated with CRRT and now hemodialysis.  Underwent left femoral artery thrombectomy and revision of LUE AV graft.  Receiving Eliquis for DVT prophylaxis.  Has received a total of 5 PRBCs, 4 of them in July and the fifth on 09/17/2020.  Hgb nadir 6.9, currently 8.   Microcytic with MCV 77. 09/24/2020 CTAP wo contrast for leukocytosis, abd pain: Confirms large sacral decubitus ulcer eroding into sacrum.  Gas tracking into right gluteus maximus, no drainable fluid collections.  Cholelithiasis.  Balloon retention gastrostomy tube with the balloon partially withdrawn into gastrostomy  tract.  Stable (cw 07/26/20), nodular, irregular thickening of ascending colon, though new since 09/2019.  Might represent inflammation but infiltrative neoplasm could represent in this fashion. Palliative care discussion with and results of wife wanting advanced care though he is a partial code.   Anticipate discharge to SNF/vent capable facility in [place] when clinically ready.    Past Medical History:  Diagnosis Date   Anemia    Anxiety    Chronic respiratory failure (HCC)    DM2 (diabetes mellitus, type 2) (HCC)    Functional quadriplegia (HCC)    Hypertension    Multiple drug resistant organism (MDRO) culture positive    Urine retention    UTI (urinary tract infection)     Past Surgical History:  Procedure Laterality Date   AV FISTULA PLACEMENT Left 09/01/2020   Procedure: INSERTION OF LEFT UPPER ARM ARTERIOVENOUS (AV) GORE-TEX GRAFT;  Surgeon: Cherre Robins, MD;  Location: [facility];  Service: Vascular;  Laterality: Left;   COLOSTOMY     CYSTOSCOPY WITH STENT PLACEMENT Right 10/08/2019   Procedure: CYSTOSCOPY WITH  STENT PLACEMENT;  Surgeon: Lucas Mallow, MD;  Location: [facility];  Service: Urology;  Laterality: Right;   IR FLUORO GUIDE CV LINE LEFT  08/28/2020   IR FLUORO GUIDE CV LINE RIGHT  08/07/2020   IR FLUORO GUIDE CV  LINE RIGHT  08/07/2020   IR REPLACE G-TUBE SIMPLE WO FLUORO  09/07/2020   IR US GUIDE VASC ACCESS LEFT  08/28/2020   IR US GUIDE VASC ACCESS RIGHT  08/07/2020   PEG PLACEMENT     ROTATOR CUFF REPAIR     THROMBECTOMY AND REVISION OF ARTERIOVENTOUS (AV) GORETEX  GRAFT Left 09/22/2020   Procedure: ARTERIOVENIOUS REVISION AND THROMBECTOMY LEFT ARM;  Surgeon: Serafina Mitchell, MD;  Location: [facility];  Service: Vascular;  Laterality: Left;   TRACHEOSTOMY      Prior to Admission medications   Medication Sig Start Date End Date Taking? Authorizing Provider  acetaminophen (TYLENOL) 325 MG suppository Place 650 mg rectally every 6 (six) hours as needed for mild pain or  fever (.=101).   Yes [provider]  Amino Acids-Protein Hydrolys (FEEDING SUPPLEMENT, PRO-STAT 64,) LIQD Take 30 mLs by mouth daily.   Yes [provider]  amLODipine (NORVASC) 5 MG tablet Place 5 mg into feeding tube daily.   Yes [provider]  buPROPion (WELLBUTRIN) 75 MG tablet Place 75 mg into feeding tube 2 (two) times daily.   Yes [provider]  glycopyrrolate (ROBINUL) 1 MG tablet Place 1 mg into feeding tube every 8 (eight) hours.   Yes [provider]  losartan (COZAAR) 100 MG tablet Place 100 mg into feeding tube daily.   Yes [provider]  nutrition supplement, JUVEN, (JUVEN) PACK Place 1 packet into feeding tube in the morning and at bedtime.   Yes [provider]  Polyethyl Glycol-Propyl Glycol (SYSTANE) 0.4-0.3 % SOLN Place 1 drop into both eyes 2 (two) times daily.   Yes [provider]  rivaroxaban (XARELTO) 20 MG TABS tablet Take 20 mg by mouth daily with supper.   Yes [provider]  sodium zirconium cyclosilicate (LOKELMA) 10 g PACK packet Place 10 g into feeding tube daily.   Yes [provider]  acetaminophen (TYLENOL) 325 MG tablet Place 650 mg into feeding tube every 8 (eight) hours as needed for mild pain, fever or headache.    [provider]  amiodarone (PACERONE) 200 MG tablet Place 200 mg into feeding tube daily.    [provider]  carvedilol (COREG) 6.25 MG tablet Place 6.25 mg into feeding tube every 12 (twelve) hours. Patient not taking: Reported on 07/12/2020    [provider]  chlorhexidine (PERIDEX) 0.12 % solution 5 mLs by Mouth Rinse route every 12 (twelve) hours.     [provider]  clonazePAM (KLONOPIN) 0.5 MG tablet Place 1 tablet (0.5 mg total) into feeding tube every 8 (eight) hours. Patient taking differently: Place 0.5 mg into feeding tube 2 (two) times daily. 07/12/19   Allie Bossier, MD  diltiazem (CARDIZEM) 60 MG  tablet Place 60 mg into feeding tube every 6 (six) hours.    [provider]  docusate (COLACE) 50 MG/5ML liquid Place 10 mLs (100 mg total) into feeding tube every 12 (twelve) hours. 07/12/19   Allie Bossier, MD  famotidine (PEPCID) 20 MG tablet Place 1 tablet (20 mg total) into feeding tube at bedtime. Patient taking differently: Place 20 mg into feeding tube 2 (two) times daily. 07/12/19   Allie Bossier, MD  fentaNYL (DURAGESIC) 25 MCG/HR Place 1 patch onto the skin every 3 (three) days. 07/14/19   Allie Bossier, MD  gabapentin (NEURONTIN) 250 MG/5ML solution Place 6 mLs (300 mg total) into feeding tube 2 (two) times daily. 07/12/19  Allie Bossier, MD  gabapentin (NEURONTIN) 300 MG capsule Place 300 mg into feeding tube every 12 (twelve) hours.    [provider]  HYDROcodone-acetaminophen (NORCO/VICODIN) 5-325 MG tablet Place 1 tablet into feeding tube every 6 (six) hours as needed for moderate pain.    [provider]  insulin glargine (LANTUS) 100 UNIT/ML injection Inject 30 Units into the skin daily.    [provider]  insulin regular (NOVOLIN R) 100 units/mL injection Inject 2-10 Units into the skin See admin instructions. Inject 2-10 units into the skin every 6 hours, per sliding scale: BGL 151-200 = 2 units, 201-250= 4 units, 251-300= 6 units, 301-350= 8 units, 351-400 = 10 units and call MD if <60 or >400    [provider]  ipratropium-albuterol (DUONEB) 0.5-2.5 (3) MG/3ML SOLN Take 3 mLs by nebulization every 4 (four) hours as needed (for shortness of breath).    [provider]  lansoprazole (PREVACID) 30 MG capsule Place 30 mg into feeding tube daily at 12 noon. Patient not taking: Reported on 07/12/2020    [provider]  lisinopril (ZESTRIL) 5 MG tablet Take 1 tablet (5 mg total) by mouth daily. Patient not taking: Reported on 12/19/2019 07/13/19   Allie Bossier, MD  LORazepam (ATIVAN) 2 MG/ML injection Inject 0.25  mLs (0.5 mg total) into the vein every 4 (four) hours as needed for anxiety. Replace with clonazepam once enteral absorption confirmed. Patient not taking: Reported on 12/19/2019 10/11/19   Candee Furbish, MD  Multiple Vitamin (MULTIVITAMIN WITH MINERALS) TABS tablet Take 1 tablet by mouth daily. Patient taking differently: Place 1 tablet into feeding tube daily. 66m [id number] 07/13/19   WoAllie BossierMD  Nutritional Supplements (FEEDING SUPPLEMENT, OSMOLITE 1.5 CAL,) LIQD Place 1,000 mLs into feeding tube continuous. Patient taking differently: Place 1,000 mLs into feeding tube continuous. Infuse at 50 ml/hr. 07/12/19   WoAllie BossierMD  Olopatadine HCl 0.2 % SOLN Place 1 drop into both eyes daily. Patient not taking: Reported on 07/12/2020    [provider]  ondansetron (ZOFRAN) 4 MG/2ML SOLN injection Inject 2 mLs (4 mg total) into the vein every 6 (six) hours as needed for nausea. 10/11/19   SmCandee FurbishMD  pantoprazole (PROTONIX) 40 MG injection Inject 40 mg into the vein at bedtime. Patient not taking: Reported on 12/19/2019 10/11/19   SmCandee FurbishMD  polyethylene glycol (MIRALAX / GLYCOLAX) 17 g packet Take 17 g by mouth daily. Patient taking differently: Place 17 g into feeding tube daily.  07/18/19   SmCandee FurbishMD  scopolamine (TRANSDERM-SCOP) 1 MG/3DAYS Place 1 patch onto the skin every 3 (three) days.    [provider]  sertraline (ZOLOFT) 100 MG tablet Place 1 tablet (100 mg total) into feeding tube at bedtime. 07/12/19   WoAllie BossierMD  Sodium Chloride Flush (NORMAL SALINE FLUSH IV) Inject 10 mLs into the vein See admin instructions. 10 ml's as needed before and after medications    [provider]    Scheduled Meds:  amiodarone  200 mg Per Tube Daily   apixaban  5 mg Per Tube BID   buPROPion  75 mg Per Tube BID   chlorhexidine gluconate (MEDLINE KIT)  15 mL Mouth Rinse BID   Chlorhexidine Gluconate Cloth  6 each Topical Daily    Chlorhexidine Gluconate Cloth  6 each Topical Q0600   darbepoetin (ARANESP) injection - DIALYSIS  100 mcg Intravenous Q Tue-HD  feeding supplement (PROSource TF)  45 mL Per Tube TID   guaiFENesin  5 mL Per Tube BID   insulin aspart  0-9 Units Subcutaneous Q4H   insulin glargine-yfgn  30 Units Subcutaneous Daily   lidocaine  1 patch Transdermal Q24H   mouth rinse  15 mL Mouth Rinse 10 times per day   metaxalone  800 mg Per Tube TID   metroNIDAZOLE  500 mg Per Tube BID   midodrine  5 mg Per Tube TID   multivitamin  1 tablet Per Tube QHS   pantoprazole sodium  40 mg Per Tube QHS   sertraline  100 mg Per Tube Daily   simethicone  40 mg Per Tube QID   sodium chloride flush  10-40 mL Intracatheter Q12H   Infusions:  sodium chloride 10 mL/hr at 10/12/2020 1429   sodium chloride 500 mL (09/01/20 1408)   sodium chloride     sodium chloride     cefTRIAXone (ROCEPHIN)  IV 2 g (09/24/20 2154)   feeding supplement (NEPRO CARB STEADY) 1,000 mL (09/25/20 0609)   PRN Meds: sodium chloride, sodium chloride, sodium chloride, alteplase, fentaNYL (SUBLIMAZE) injection, glycopyrrolate, heparin, heparin, heparin sodium (porcine), ipratropium-albuterol, lidocaine (PF), lidocaine (PF), lidocaine-prilocaine, morphine injection, oxyCODONE, pentafluoroprop-tetrafluoroeth, phenol, polyethylene glycol, sodium chloride flush   Allergies as of 06/15/2020 - Review Complete 06/19/2020  Allergen Reaction Noted   Adhesive [tape] Rash 10/07/2019    History reviewed. No pertinent family history.  Social History   Socioeconomic History   Marital status: Married    Spouse name: Not on file   Number of children: Not on file   Years of education: Not on file   Highest education level: Not on file  Occupational History   Not on file  Tobacco Use   Smoking status: Never   Smokeless tobacco: Never  Substance and Sexual Activity   Alcohol use: Not on file   Drug use: Not on file   Sexual activity: Not on  file  Other Topics Concern   Not on file  Social History Narrative   Not on file   Social Determinants of Health   Financial Resource Strain: Not on file  Food Insecurity: Not on file  Transportation Needs: Not on file  Physical Activity: Not on file  Stress: Not on file  Social Connections: Not on file  Intimate Partner Violence: Not on file    REVIEW OF SYSTEMS: Pt non-verbal and not following commands or acknowledging me.  Unable to obtainROS   PHYSICAL EXAM: Vital signs in last 24 hours: Vitals:   09/25/20 0841 09/25/20 0900  BP:  (!) 145/86  Pulse:    Resp:  20  Temp: 98.2 F (36.8 C)   SpO2:     Wt Readings from Last 3 Encounters:  09/25/20 81.4 kg  10/10/19 75.8 kg  07/17/19 66.7 kg    General: Patient laying in bed.  Appears chronically, acutely ill.  Not acknowledging my presence. Head: No signs of head trauma.  No facial asymmetry. Eyes: Conjunctival pallor. Ears: Unable to assess hearing Nose: No congestion or discharge Mouth: No blood in the mouth.  Mucosa appears moist. Neck: Tracheostomy in place. Lungs: No labored breathing on vent. Heart: Sinus rhythm in the low 80s.  No MRG.  S1, S2 present. Abdomen: Obese.  Soft.  Nontender.  PEG tube site in right upper abdomen benign without drainage.  Ostomy present to the left of midline in the lower abdomen with brown, liquid stool.  5 inch  healed surgical scar in right lower abdomen..   Rectal: Deferred. Musc/Skeltl: No obvious joint swelling, redness or contracture deformities. Extremities: Edema in the upper extremities.  Minor Neurologic: Eyes are open.  Is not following commands.  Does not appear to register my voice or questions.  Do not see spontaneous movement of arms or legs. Skin: Did not observe open sores but did not turn patient over for posterior exam.     Intake/Output from previous day: 09/12 0701 - 09/13 0700 In: 1035 [NG/GT:945] Out: 300 [Stool:300] Intake/Output this shift: No  intake/output data recorded.  LAB RESULTS: Recent Labs    09/23/20 1542 09/24/20 0425 09/25/20 0347  WBC 22.5* 25.4* 19.6*  HGB 10.2* 7.8* 8.0*  HCT 31.5* 24.5* 24.6*  PLT 510* 420* 426*   BMET Lab Results  Component Value Date   NA 130 (L) 09/24/2020   NA 129 (L) 09/23/2020   NA 133 (L) 09/20/2020   K 3.9 09/24/2020   K 4.3 09/23/2020   K 3.7 09/20/2020   CL 95 (L) 09/24/2020   CL 92 (L) 09/23/2020   CL 95 (L) 09/20/2020   CO2 23 09/24/2020   CO2 23 09/23/2020   CO2 24 09/20/2020   GLUCOSE 165 (H) 09/24/2020   GLUCOSE 146 (H) 09/23/2020   GLUCOSE 133 (H) 09/20/2020   BUN 59 (H) 09/24/2020   BUN 101 (H) 09/23/2020   BUN 79 (H) 09/20/2020   CREATININE 1.11 09/24/2020   CREATININE 1.56 (H) 09/23/2020   CREATININE 1.32 (H) 09/20/2020   CALCIUM 9.0 09/24/2020   CALCIUM 9.7 09/23/2020   CALCIUM 9.4 09/20/2020   LFT Recent Labs    09/24/20 0425  ALBUMIN 1.5*   PT/INR Lab Results  Component Value Date   INR 1.9 (H) 07/25/2020   INR 1.5 (H) 07/24/2020   INR 1.5 (H) 07/23/2020   Hepatitis Panel No results for input(s): HEPBSAG, HCVAB, HEPAIGM, HEPBIGM in the last 72 hours. C-Diff No components found for: CDIFF Lipase     Component Value Date/Time   LIPASE 28 07/26/2020 0835    Drugs of Abuse  No results found for: LABOPIA, COCAINSCRNUR, LABBENZ, AMPHETMU, THCU, LABBARB   RADIOLOGY STUDIES: CT ABDOMEN PELVIS WO CONTRAST  Result Date: 09/24/2020 CLINICAL DATA:  Abdominal pain, acute, nonlocalized persistent leukocytosis EXAM: CT ABDOMEN AND PELVIS WITHOUT CONTRAST TECHNIQUE: Multidetector CT imaging of the abdomen and pelvis was performed following the standard protocol without IV contrast. COMPARISON:  07/26/2020 FINDINGS: Lower chest: Moderate multi-vessel coronary artery calcification. Global cardiac size within normal limits. No pericardial effusion. Bilateral lower lobe collapse and consolidation with traction bronchiectasis is unchanged trace left  pleural effusion is present, improved since prior examination. Hepatobiliary: Cholelithiasis without pericholecystic inflammatory change. Liver unremarkable. No intra or extrahepatic biliary ductal dilation. Pancreas: Unremarkable Spleen: Unremarkable Adrenals/Urinary Tract: The adrenal glands are unremarkable. The kidneys are normal in position. Severe right renal cortical atrophy is unchanged. Right double-J ureteral stent extending from the right upper polar cortex into the bladder is unchanged. Compensatory hypertrophy of the left kidney. Multiple simple cortical cysts are seen within the left kidney measuring up to 8.5 cm in dimension. 13 mm nonobstructing calculus noted within the lower pole of the left kidney is unchanged. No ureteral calculi. No hydronephrosis. Bladder is otherwise unremarkable. Stomach/Bowel: Balloon retention gastrostomy catheter is in place within the a gastric antrum with the retaining balloon partially withdrawn into the tract, progressive since prior examination. This is best seen on image # 29/3. Surgical changes of descending colostomy  and Hartmann pouch formation are identified. There is persistent nodular, irregular fold thickening involving the ascending colon just distal to the ileocecal valve in this region, there is background asymmetric mild-to-moderate diverticulosis and, together, these may reflect subacute changes of diverticulitis. Irregularity, however, raises the question of a infiltrative neoplasm in this location. No evidence of obstruction or focal inflammation. Appendix normal. No free intraperitoneal gas or fluid. Vascular/Lymphatic: Mild aortoiliac atherosclerotic calcification. No aortic aneurysm. No pathologic adenopathy within the abdomen and pelvis. Reproductive: Prostate is unremarkable. Other: Large sacral decubitus ulcer is again identified with erosive changes involving the distal sacrum, best appreciated on sagittal reformat # 115/7. There is debris or  surgical packing material within the ulcer cavity. Gas is seen tracking into the right gluteus maximus muscle. No discrete drainable fluid collection. Diastasis of the rectus abdominus again noted. Tiny fat containing right inguinal hernia. Musculoskeletal: Degenerative changes are seen within the lumbar spine. No lytic or blastic bone lesions are identified. IMPRESSION: Large sacral decubitus ulcer again identified with erosion of the terminal sacrum and extensive material within the ulcer cavity likely representing surgical packing material. Gas is seen tracking into the right gluteus maximus muscle, however, there is no associated discrete drainable fluid collection identified. Bilateral lower lobes collapse and consolidation, unchanged. Improving bilateral pleural effusions. Cholelithiasis. Stable marked atrophy of the right kidney with unchanged right double-J ureteral stent in place. Multiple simple left renal cortical cysts measuring up to 8.5 cm, unchanged. Stable left nonobstructing nephrolithiasis. No hydronephrosis. No urolithiasis. Balloon retention gastrostomy catheter with the retaining balloon partially withdrawn into the gastrostomy catheter tract. Stable nodular irregular thickening of the ascending colon, stable since immediate prior examinations, but new since remote prior examination of 10/07/2019. While this may represent subacute inflammatory change, an infiltrative neoplasm could appear in this fashion and correlation with endoscopy may be helpful once the patient's acute issues have resolved. Aortic Atherosclerosis (ICD10-I70.0). Electronically Signed   By: Fidela Salisbury M.D.   On: 09/24/2020 16:32   DG CHEST PORT 1 VIEW  Result Date: 09/24/2020 CLINICAL DATA:  Leukocytosis, diabetes, hypertension EXAM: PORTABLE CHEST 1 VIEW COMPARISON:  07/19/2020 FINDINGS: Tracheostomy tip seen 6.7 cm above the carina. Nasoenteric feeding tube extends into the upper abdomen beyond the margin of the  examination. Left internal jugular central venous catheter tip noted within the superior vena cava. Right internal jugular hemodialysis catheter tip noted within the superior cavoatrial junction. Bibasilar pulmonary infiltrates or atelectasis persist. Small bilateral pleural effusions are suspected. No pneumothorax. Cardiac size is within normal limits. No acute bone abnormality. IMPRESSION: Stable support lines and tubes. Stable bibasilar atelectasis or infiltrate. Small bilateral pleural effusions, grossly unchanged. Electronically Signed   By: Fidela Salisbury M.D.   On: 09/24/2020 10:47     IMPRESSION:   Irregular appearance of right colon on CT scan, ?  Inflammation vs infiltrating neoplasm.  Ostomy in lower left abdomen, details of when, where, why unknown.     Microcytic anemia.  Known anemia of chronic disease.     HAP, multifocal pneumonia inpatient on long-term trach/vent.     Quadriplegia, bedbound.     Sacral decubitus ulcers.      Hx A fib.  On Eliquis.   Preeviously on Xarelto.       PLAN:       Per Dr Lorenso Courier.  Defer colonsocopy in current acute setting for concern for diverticulitis.  Given patient's multiple comorbidities not clear colonoscopy would alter clinical course.  If he were to found  to have colon neoplasm, fear surgery would deem him nonoperative candidate.   Azucena Freed  09/25/2020, 10:21 AM Phone [phone]

## 2020-09-25 NOTE — Progress Notes (Signed)
Spoke to [facility] admission staff last week who stated that they will advise [facility] staff when dialysis information is needed and renal navigator can provide info at that time. No paperwork needed at this time.   Melven Sartorius  Renal Navigator  [phone]

## 2020-09-26 DIAGNOSIS — R140 Abdominal distension (gaseous): Secondary | ICD-10-CM | POA: Diagnosis not present

## 2020-09-26 DIAGNOSIS — J9611 Chronic respiratory failure with hypoxia: Secondary | ICD-10-CM | POA: Diagnosis not present

## 2020-09-26 DIAGNOSIS — J9601 Acute respiratory failure with hypoxia: Secondary | ICD-10-CM | POA: Diagnosis not present

## 2020-09-26 DIAGNOSIS — N186 End stage renal disease: Secondary | ICD-10-CM | POA: Diagnosis not present

## 2020-09-26 LAB — RENAL FUNCTION PANEL
Albumin: 1.7 g/dL — ABNORMAL LOW (ref 3.5–5.0)
Anion gap: 19 — ABNORMAL HIGH (ref 5–15)
BUN: 119 mg/dL — ABNORMAL HIGH (ref 8–23)
CO2: 20 mmol/L — ABNORMAL LOW (ref 22–32)
Calcium: 9.3 mg/dL (ref 8.9–10.3)
Chloride: 88 mmol/L — ABNORMAL LOW (ref 98–111)
Creatinine, Ser: 1.66 mg/dL — ABNORMAL HIGH (ref 0.61–1.24)
GFR, Estimated: 44 mL/min — ABNORMAL LOW (ref >60)
Glucose, Bld: 122 mg/dL — ABNORMAL HIGH (ref 70–99)
Phosphorus: 6.7 mg/dL — ABNORMAL HIGH (ref 2.5–4.6)
Potassium: 4.2 mmol/L (ref 3.5–5.1)
Sodium: 127 mmol/L — ABNORMAL LOW (ref 135–145)

## 2020-09-26 LAB — GLUCOSE, CAPILLARY
Glucose-Capillary: 125 mg/dL — ABNORMAL HIGH (ref 70–99)
Glucose-Capillary: 108 mg/dL — ABNORMAL HIGH (ref 70–99)
Glucose-Capillary: 104 mg/dL — ABNORMAL HIGH (ref 70–99)
Glucose-Capillary: 137 mg/dL — ABNORMAL HIGH (ref 70–99)
Glucose-Capillary: 130 mg/dL — ABNORMAL HIGH (ref 70–99)

## 2020-09-26 NOTE — Progress Notes (Signed)
Assisted tele visit to patient with wife.  Damoney Julia, Canary Brim, RN

## 2020-09-26 NOTE — Progress Notes (Addendum)
PROGRESS NOTE    CASHTON Orr   LOV:[id number]  DOB: May 17, 1950  DOA: 07/05/2020 PCP: Townsend Roger, MD   Brief Narrative:  Luis Orr is a 70 year old male with C3-C5 compressive myelopathy and subsequent quadriplegia who is chronically on a trach and vent.  He presented from [facility] facility with altered mental status hypotension and pneumonia with resultant septic shock and was admitted to the ICU.  He is noted to have multiple admissions for infections including bacteremia, UTI and pneumonia and has grown out multidrug-resistant Klebsiella, MRSA and Pseudomonas.  His most recent admission was in 9/21 due to multidrug-resistant Klebsiella, right ureteral calculus requiring a double-J stent and was treated with meropenem.  He was discharged back to [facility].  The patient was admitted to the ICU on 6/29 and treated for septic shock. 7/2 ID Consult for MDR acinetobacter in trach asp, Meropenem changed to unasyn 7/2 PRBC transfusion, iron replacement 7/5 Pressors added again. Central line placed. Drainage from gastrostomy tube >> CT Abd without fluid collection around gastrostomy tube but with diffuse body wall edema. Echo EF 50-55%, mild LVH 7/6 Renal Replacement Therapy ; Code status changed to partial Code (DNR) 7/7 Transfuse 1U PRBC 7/9 add solu cortef 7/10 off pressors, weaning stress steroids 7/11 Pressors added back on again 7/12 ID consult due to rising WBC, hypothermia, hypotension concern for worsening infection. Vancomycin added. Repeat blood cx collected. 7/15 CRRT stopped 7/17 Restarted on low dose levo. No evidence of new infection. Held off on antibiotics. Nephrology contacted family and decision was made to continue iHD this week to allow additional time for renal recovery. Patient is not the best long term iHD candidate but family wants aggressive care 7/18 plans for iHD, tolerated with 2L removed 719 slight drop in hgb to 6.9 will transfuse 1 unit  PRBC 7/22 > 7/26: Tolerating HD. TOC consulted for placement. Will likely need out of state placement. 8/15 WBCs greater than 28,000, hypoglycemia, procalcitonin >18, chest x-ray consistent with pneumonia, recent blood cultures consistent with contamination.  Unasyn IV resumed after sputum culture obtained 8/16 consultation placed with ethics committee regarding evaluation for futility of care 8/16 Sputum cx + for Acinetobacter with sensitivities pending 8/17 extensive conversation held with patient's wife regarding patient's poor prognosis and reemergence of Acinetobacter VAP.  Also discussed with her that patient has been able to convey to multiple staff that he does not want to continue on the ventilator or with dialysis.  Wife reports that the patient does not understand what he is saying, she is the POA and that she wants to continue aggressive care. 8/18 patient now telling nephrology, myself and the rest of the staff he replies yes to wanting to remain on dialysis and on the ventilator and this direct question is asked. 8/18 Acinetobacter sensitive to Unasyn only.  Discussed with ID/Dr. Juleen China.  Plan is to administer antibiotics for 7-day since patient has not had any increased O2 needs and remains afebrile.  If any concerns over worsening symptoms can give an additional 3 days. Developed moderate bleeding around tracheostomy site on 8/19.  Likely related to combination of Eliquis and heparin required for CRRT.  We will hold Eliquis for now and monitor degree of bleeding Eliquis resumed on 8/22.  Overnight into 8/23 patient bit his tongue and had bleeding.  Also had some bleeding around trach likely from oral bleeding.  Also though did have bleeding from old central line site. 8/23 had transient bleeding around trach and prior  central line insertion site overnight.  Patient had bitten tongue and this likely explain the bleeding.  This morning patient was not having any bleeding.  On 8/25 had  redeveloped slow ooze bleeding from mouth and trach.  Eliquis discontinued in favor of IV heparin especially given patient will undergo surgical procedure for vascular access on 8/29 8/30 Left FA AVG occluded 9/2 status post thrombectomy and revision of LUE AV graft 9/4 LUE edema with negative venous duplex. 9/5 heparin discontinued and Eliquis resumed     Subjective: No complaints-she appears to be stable on the ventilator-does not appear to respond to any questions that I ask him.    Assessment & Plan:   Principal Problem:   Acute respiratory failure with hypoxia superimposed on chronic respiratory failure Recurrent ventilator associated pneumonias Septic shock -Developed Acinetobacter pneumonia on 8/14 and completed 7 days of Unasyn -Remains on the ventilator and is stable at this time -Appreciate PCCM managing this patient's ventilator  Active Problems: AKI with oliguria Now end-stage renal disease - initially suspected to be ATN related to septic shock -Unfortunately renal recovery never occurred -Imaging revealed marked atrophy of the right kidney- Double-J ureteral stent is still present - Appreciate nephrology management of this-she continues to require dialysis via HD catheter on Tuesday Thursday Saturdays - Has left arm AV graft that was placed on 8/29 however, this has clotted-she had a thrombectomy and revision on 9/2 - Graft will need to mature for 4 weeks after revision before it can be utilized - Midodrine being used for hypotension and tachycardia  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 into the sacrum - CT abdomen and pelvis performed on the 12th for further work-up of leukocytosis reveals that the sacral decubitus ulcer is eroding into the distal sacrum with gas tracking into the right gluteus maximus muscle-no fluid noted  Persistently elevated WBC count - WBC count has been continued continuously elevated but fluctuates and has been as high as the  30s -WBC count currently 19.6 - The patient has been on ceftriaxone and Flagyl for possible diverticulitis however other potential sources could include the sacral decubitus ulcer and also the stent in the ureter  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 tube- severe protein calorie malnutrition -Continue tube feeds and supportive care -Receiving Skelaxin  Left upper arm edema - Related to above-mentioned clotted graft which is status post revision  A-fib, paroxysmal - cont Amiodarone  Time spent in minutes: 40 DVT prophylaxis: Place and maintain sequential compression device Start: 07/31/20 1614 apixaban (ELIQUIS) tablet 5 mg  Code Status: Partial- No CPR- Intubate, use BiPAP, give ACLS meds Family Communication:  Level of Care: Level of care: ICU Disposition Plan:  Status is: Inpatient  Remains inpatient appropriate because:Unsafe d/c plan  Dispo: The patient is from:  [facility]              Anticipated d/c is to:  TBD  Patient currently is not medically stable to d/c.   Difficult to place patient No      Consultants:  Pulmonary critical care Nephrology Infectious disease Palliative medicine  Procedures:  HD catheter AV fistula with declotting and revision   Objective: Vitals:   09/26/20 0841 09/26/20 0900 09/26/20 1100 09/26/20 1116  BP:  (!) 150/84    Pulse: 76 76  76  Resp: 20 20  20    Temp:   97.6 F (36.4 C)   TempSrc:   Oral   SpO2: 99% 100%  100%  Weight:      Height:        Intake/Output Summary (Last 24 hours) at 09/26/2020 1319 Last data filed at 09/26/2020 0800 Gross per 24 hour  Intake 855 ml  Output 100 ml  Net 755 ml   Filed Weights   09/24/20 0500 09/25/20 0500 09/26/20 0500  Weight: 80.1 kg 81.4 kg 82.4 kg    Examination: General exam: Appears comfortable  HEENT: PERRLA, oral mucosa moist, no sclera icterus or thrush- stable on ventilator Respiratory system: Clear to auscultation. Respiratory effort normal. Cardiovascular system: S1 & S2 heard, RRR.   Gastrointestinal system: Abdomen soft, non-tender, mildly distended- colostomy with liquids stool - Normal bowel sounds. Central nervous system: Alert  Extremities: No cyanosis, clubbing - left arm and hand edematous      Data Reviewed: I have personally reviewed following labs and imaging studies  CBC: Recent Labs  Lab 09/20/20 0335 09/23/20 0755 09/23/20 1542 09/24/20 0425 09/25/20 0347  WBC 18.5* 22.7* 22.5* 25.4* 19.6*  HGB 7.6* 8.3* 10.2* 7.8* 8.0*  HCT 23.4* 25.2* 31.5* 24.5* 24.6*  MCV 80.4 78.8* 78.2* 79.3* 77.1*  PLT 388 421* 510* 420* 841*   Basic Metabolic Panel: Recent Labs  Lab 09/19/20 1607 09/20/20 0834 09/23/20 0755 09/24/20 0425 09/26/20 0952  NA 136 133* 129* 130* 127*  K 3.9 3.7 4.3 3.9 4.2  CL  --  95* 92* 95* 88*  CO2  --  24 23 23  20*  GLUCOSE  --  133* 146* 165* 122*  BUN  --  79* 101* 59* 119*  CREATININE  --  1.32* 1.56* 1.11 1.66*  CALCIUM  --  9.4 9.7 9.0 9.3  PHOS  --  4.5  --  3.6 6.7*   GFR: Estimated Creatinine Clearance: 41.7 mL/min (A) (by C-G formula based on SCr of 1.66 mg/dL (H)). Liver Function Tests: Recent Labs  Lab 09/20/20 0834 09/24/20 0425 09/26/20 0952  ALBUMIN 1.6* 1.5* 1.7*   No results for input(s): LIPASE, AMYLASE in the last 168 hours. No results for input(s): AMMONIA in the last 168 hours. Coagulation  Profile: No results for input(s): INR, PROTIME in the last 168 hours. Cardiac Enzymes: No results for input(s): CKTOTAL, CKMB, CKMBINDEX, TROPONINI in the last 168 hours. BNP (last 3 results) No results for input(s): PROBNP in the last 8760 hours. HbA1C: No results for input(s): HGBA1C in the last 72 hours. CBG: Recent Labs  Lab 09/25/20 1914 09/25/20 2317 09/26/20 0352 09/26/20 0808 09/26/20 1105  GLUCAP 118* 135* 125* 108* 104*   Lipid Profile: No results for input(s): CHOL, HDL, LDLCALC, TRIG, CHOLHDL, LDLDIRECT in the last 72 hours. Thyroid Function Tests: No results for input(s): TSH, T4TOTAL, FREET4, T3FREE, THYROIDAB in the last 72 hours. Anemia Panel: No results for input(s): VITAMINB12, FOLATE, FERRITIN, TIBC, IRON, RETICCTPCT in the last 72 hours. Urine analysis:    Component Value Date/Time   COLORURINE YELLOW 07/25/2020 0334  APPEARANCEUR CLEAR 07/25/2020 0334   LABSPEC 1.020 07/25/2020 0334   PHURINE 6.5 07/25/2020 0334   GLUCOSEU 100 (A) 07/25/2020 0334   HGBUR LARGE (A) 07/25/2020 0334   BILIRUBINUR SMALL (A) 07/25/2020 0334   KETONESUR NEGATIVE 07/25/2020 0334   PROTEINUR >300 (A) 07/25/2020 0334   NITRITE NEGATIVE 07/25/2020 0334   LEUKOCYTESUR MODERATE (A) 07/25/2020 0334   Sepsis Labs: @LABRCNTIP (procalcitonin:4,lacticidven:4) )No results found for this or any previous visit (from the past 240 hour(s)).       Radiology Studies: CT ABDOMEN PELVIS WO CONTRAST  Result Date: 09/24/2020 CLINICAL DATA:  Abdominal pain, acute, nonlocalized persistent leukocytosis EXAM: CT ABDOMEN AND PELVIS WITHOUT CONTRAST TECHNIQUE: Multidetector CT imaging of the abdomen and pelvis was performed following the standard protocol without IV contrast. COMPARISON:  07/26/2020 FINDINGS: Lower chest: Moderate multi-vessel coronary artery calcification. Global cardiac size within normal limits. No pericardial effusion. Bilateral lower lobe collapse and consolidation with  traction bronchiectasis is unchanged trace left pleural effusion is present, improved since prior examination. Hepatobiliary: Cholelithiasis without pericholecystic inflammatory change. Liver unremarkable. No intra or extrahepatic biliary ductal dilation. Pancreas: Unremarkable Spleen: Unremarkable Adrenals/Urinary Tract: The adrenal glands are unremarkable. The kidneys are normal in position. Severe right renal cortical atrophy is unchanged. Right double-J ureteral stent extending from the right upper polar cortex into the bladder is unchanged. Compensatory hypertrophy of the left kidney. Multiple simple cortical cysts are seen within the left kidney measuring up to 8.5 cm in dimension. 13 mm nonobstructing calculus noted within the lower pole of the left kidney is unchanged. No ureteral calculi. No hydronephrosis. Bladder is otherwise unremarkable. Stomach/Bowel: Balloon retention gastrostomy catheter is in place within the a gastric antrum with the retaining balloon partially withdrawn into the tract, progressive since prior examination. This is best seen on image # 29/3. 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 There is persistent nodular, irregular fold thickening involving the ascending colon just distal to the ileocecal valve in this region, there is background asymmetric mild-to-moderate diverticulosis and, together, these may reflect subacute changes of diverticulitis. Irregularity, however, raises the question of a infiltrative neoplasm in this location. No evidence of obstruction or focal inflammation. Appendix normal. No free intraperitoneal gas or fluid. Vascular/Lymphatic: Mild aortoiliac atherosclerotic calcification. No aortic aneurysm. No pathologic adenopathy within the abdomen and pelvis. Reproductive: Prostate is unremarkable. Other: Large sacral decubitus ulcer is again identified with erosive changes involving the distal sacrum, best appreciated on  sagittal reformat # 115/7. There is debris or surgical packing material within the ulcer cavity. Gas is seen tracking into the right gluteus maximus muscle. No discrete drainable fluid collection. Diastasis of the rectus abdominus again noted. Tiny fat containing right inguinal hernia. Musculoskeletal: Degenerative changes are seen within the lumbar spine. No lytic or blastic bone lesions are identified. IMPRESSION: Large sacral decubitus ulcer again identified with erosion of the terminal sacrum and extensive material within the ulcer cavity likely representing surgical packing material. Gas is seen tracking into the right gluteus maximus muscle, however, there is no associated discrete drainable fluid collection identified. Bilateral lower lobes collapse and consolidation, unchanged. Improving bilateral pleural effusions. Cholelithiasis. Stable marked atrophy of the right kidney with unchanged right double-J ureteral stent in place. Multiple simple left renal cortical cysts measuring up to 8.5 cm, unchanged. Stable left nonobstructing nephrolithiasis. No hydronephrosis. No urolithiasis. Balloon retention gastrostomy catheter with the retaining balloon partially withdrawn into the gastrostomy catheter tract. Stable nodular irregular thickening of the ascending colon, stable since  immediate prior examinations, but new since remote prior examination of 10/07/2019. While this may represent subacute inflammatory change, an infiltrative neoplasm could appear in this fashion and correlation with endoscopy may be helpful once the patient's acute issues have resolved. Aortic Atherosclerosis (ICD10-I70.0). Electronically Signed   By: Fidela Salisbury M.D.   On: 09/24/2020 16:32      Scheduled Meds:  amiodarone  200 mg Per Tube Daily   apixaban  5 mg Per Tube BID   buPROPion  75 mg Per Tube BID   chlorhexidine gluconate (MEDLINE KIT)  15 mL Mouth Rinse BID   Chlorhexidine Gluconate Cloth  6 each Topical Daily    Chlorhexidine Gluconate Cloth  6 each Topical Q0600   darbepoetin (ARANESP) injection - DIALYSIS  100 mcg Intravenous Q Tue-HD   feeding supplement (PROSource TF)  45 mL Per Tube TID   guaiFENesin  5 mL Per Tube BID   insulin aspart  0-9 Units Subcutaneous Q4H   insulin glargine-yfgn  30 Units Subcutaneous Daily   lidocaine  1 patch Transdermal Q24H   mouth rinse  15 mL Mouth Rinse 10 times per day   metaxalone  800 mg Per Tube TID   midodrine  5 mg Per Tube TID   multivitamin  1 tablet Per Tube QHS   pantoprazole sodium  40 mg Per Tube QHS   sertraline  100 mg Per Tube Daily   simethicone  40 mg Per Tube QID   sodium chloride flush  10-40 mL Intracatheter Q12H   Continuous Infusions:  sodium chloride 10 mL/hr at 09/13/2020 1429   sodium chloride 500 mL (09/01/20 1408)   sodium chloride     sodium chloride     feeding supplement (NEPRO CARB STEADY) 1,000 mL (09/26/20 0935)     LOS: 77 days      Debbe Odea, MD [facility] Pager: [email or web address] 09/26/2020, 1:19 PM

## 2020-09-26 NOTE — Progress Notes (Signed)
Labs reviewed, will hold on HD today and proceed with his scheduled treatment tomorrow.  Gean Quint, MD [facility]

## 2020-09-26 NOTE — Progress Notes (Signed)
[facility] NEPHROLOGY PROGRESS NOTE  Assessment/ Plan:  # Acute kidney Injury-prolonged dialysis dependency, now progressed to ESRD:  ATN from septic shock-without any evidence of renal recovery to date . initiated on CRRT 07/18/20 and transitioned to IHD.  Has TDC, continue with HD on TTS schedule for now.  Tolerating dialysis well but unable to sit on chair or recliner. -next HD tomorrow, did not have HD yesterday, will check a RFP today to make sure lytes are okay. Volume status stable today  # Vascular access - had left forearm AVG placed 08/31/2020 but unfortunately had clotted.  s/p thrombectomy and revision 9/2, graft can be used 4 weeks thereafter.  Swelling of left upper extremity noted continue arm elevation and supportive care.  # Septic shock/ventilator associated pneumonia due to recurrent Acinetobacter infection: Off antibiotics and stable hemodynamics.  #  Chronic respiratory failure: Status post tracheostomy with ventilator management per CCM.  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  # Chronic kidney disease/metabolic bone disease: Calcium and phosphorus level under control with hemodialysis, off binders  # Anemia: contiue ESA, monitor Hb.  Transfuse as needed.  # Acute metabolic encephalopathy: Continue management as above.,  Supportive care.  # Disposition - pt cannot return to [facility] now that he is ESRD. He is going to [facility] in [place], [place]. He needs to have [place] Medicaid & his AVG needs to be mature prior to being accepted. Other possible barrier is pt is a permanent trach & vent pt.     Subjective: Seen and examined in ICU. Did not run on HD yesterday due to staffing issues. Will be running on HD Objective Vital signs in last 24 hours: Vitals:   09/26/20 0300 09/26/20 0340 09/26/20 0400 09/26/20 0500  BP: 136/75  138/74   Pulse: 78  80   Resp: 20  20   Temp:      TempSrc:      SpO2: 98% 100% 99%    Weight:    82.4 kg  Height:       Weight change: 1 kg  Intake/Output Summary (Last 24 hours) at 09/26/2020 0732 Last data filed at 09/26/2020 0600 Gross per 24 hour  Intake 1135 ml  Output 100 ml  Net 1035 ml       Labs: Basic Metabolic Panel: Recent Labs  Lab 09/20/20 0834 09/23/20 0755 09/24/20 0425  NA 133* 129* 130*  K 3.7 4.3 3.9  CL 95* 92* 95*  CO2 _0 GLUCOSE 133* 146* 165*  BUN 79* 101* 59*  CREATININE 1.32* 1.56* 1.11  CALCIUM 9.4 9.7 9.0  PHOS 4.5  --  3.6   Liver Function Tests: Recent Labs  Lab 09/20/20 0834 09/24/20 0425  ALBUMIN 1.6* 1.5*   No results for input(s): LIPASE, AMYLASE in the last 168 hours. No results for input(s): AMMONIA in the last 168 hours. CBC: Recent Labs  Lab 09/20/20 0335 09/23/20 0755 09/23/20 1542 09/24/20 0425 09/25/20 0347  WBC 18.5* 22.7* 22.5* 25.4* 19.6*  HGB 7.6* 8.3* 10.2* 7.8* 8.0*  HCT 23.4* 25.2* 31.5* 24.5* 24.6*  MCV 80.4 78.8* 78.2* 79.3* 77.1*  PLT 388 421* 510* 420* 426*   Cardiac Enzymes: No results for input(s): CKTOTAL, CKMB, CKMBINDEX, TROPONINI in the last 168 hours. CBG: Recent Labs  Lab 09/25/20 1101 09/25/20 1506 09/25/20 1914 09/25/20 2317 09/26/20 0352  GLUCAP 115* 125* 118* 135* 125*    Iron Studies: No results for input(s): IRON, TIBC, TRANSFERRIN, FERRITIN  in the last 72 hours.  Studies/Results: CT ABDOMEN PELVIS WO CONTRAST  Result Date: 09/24/2020 CLINICAL DATA:  Abdominal pain, acute, nonlocalized persistent leukocytosis EXAM: CT ABDOMEN AND PELVIS WITHOUT CONTRAST TECHNIQUE: Multidetector CT imaging of the abdomen and pelvis was performed following the standard protocol without IV contrast. COMPARISON:  07/26/2020 FINDINGS: Lower chest: Moderate multi-vessel coronary artery calcification. Global cardiac size within normal limits. No pericardial effusion. Bilateral lower lobe collapse and consolidation with traction bronchiectasis is unchanged trace left pleural  effusion is present, improved since prior examination. Hepatobiliary: Cholelithiasis without pericholecystic inflammatory change. Liver unremarkable. No intra or extrahepatic biliary ductal dilation. Pancreas: Unremarkable Spleen: Unremarkable Adrenals/Urinary Tract: The adrenal glands are unremarkable. The kidneys are normal in position. Severe right renal cortical atrophy is unchanged. Right double-J ureteral stent extending from the right upper polar cortex into the bladder is unchanged. Compensatory hypertrophy of the left kidney. Multiple simple cortical cysts are seen within the left kidney measuring up to 8.5 cm in dimension. 13 mm nonobstructing calculus noted within the lower pole of the left kidney is unchanged. No ureteral calculi. No hydronephrosis. Bladder is otherwise unremarkable. Stomach/Bowel: Balloon retention gastrostomy catheter is in place within the a gastric antrum with the retaining balloon partially withdrawn into the tract, progressive since prior examination. This is best seen on image # 29/3. 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 There is persistent nodular, irregular fold thickening involving the ascending colon just distal to the ileocecal valve in this region, there is background asymmetric mild-to-moderate diverticulosis and, together, these may reflect subacute changes of diverticulitis. Irregularity, however, raises the question of a infiltrative neoplasm in this location. No evidence of obstruction or focal inflammation. Appendix normal. No free intraperitoneal gas or fluid. Vascular/Lymphatic: Mild aortoiliac atherosclerotic calcification. No aortic aneurysm. No pathologic adenopathy within the abdomen and pelvis. Reproductive: Prostate is unremarkable. Other: Large sacral decubitus ulcer is again identified with erosive changes involving the distal sacrum, best appreciated on sagittal reformat # 115/7. There is debris or surgical  packing material within the ulcer cavity. Gas is seen tracking into the right gluteus maximus muscle. No discrete drainable fluid collection. Diastasis of the rectus abdominus again noted. Tiny fat containing right inguinal hernia. Musculoskeletal: Degenerative changes are seen within the lumbar spine. No lytic or blastic bone lesions are identified. IMPRESSION: Large sacral decubitus ulcer again identified with erosion of the terminal sacrum and extensive material within the ulcer cavity likely representing surgical packing material. Gas is seen tracking into the right gluteus maximus muscle, however, there is no associated discrete drainable fluid collection identified. Bilateral lower lobes collapse and consolidation, unchanged. Improving bilateral pleural effusions. Cholelithiasis. Stable marked atrophy of the right kidney with unchanged right double-J ureteral stent in place. Multiple simple left renal cortical cysts measuring up to 8.5 cm, unchanged. Stable left nonobstructing nephrolithiasis. No hydronephrosis. No urolithiasis. Balloon retention gastrostomy catheter with the retaining balloon partially withdrawn into the gastrostomy catheter tract. Stable nodular irregular thickening of the ascending colon, stable since immediate prior examinations, but new since remote prior examination of 10/07/2019. While this may represent subacute inflammatory change, an infiltrative neoplasm could appear in this fashion and correlation with endoscopy may be helpful once the patient's acute issues have resolved. Aortic Atherosclerosis (ICD10-I70.0). Electronically Signed   By: Fidela Salisbury M.D.   On: 09/24/2020 16:32   DG CHEST PORT 1 VIEW  Result Date: 09/24/2020 CLINICAL DATA:  Leukocytosis, diabetes, hypertension EXAM: PORTABLE CHEST 1 VIEW COMPARISON:  07/19/2020 FINDINGS:  Tracheostomy tip seen 6.7 cm above the carina. Nasoenteric feeding tube extends into the upper abdomen beyond the margin of the examination.  Left internal jugular central venous catheter tip noted within the superior vena cava. Right internal jugular hemodialysis catheter tip noted within the superior cavoatrial junction. Bibasilar pulmonary infiltrates or atelectasis persist. Small bilateral pleural effusions are suspected. No pneumothorax. Cardiac size is within normal limits. No acute bone abnormality. IMPRESSION: Stable support lines and tubes. Stable bibasilar atelectasis or infiltrate. Small bilateral pleural effusions, grossly unchanged. Electronically Signed   By: Fidela Salisbury M.D.   On: 09/24/2020 10:47    Medications: Infusions:  sodium chloride 10 mL/hr at 10/04/2020 1429   sodium chloride 500 mL (09/01/20 1408)   sodium chloride     sodium chloride     cefTRIAXone (ROCEPHIN)  IV 2 g (09/25/20 2114)   feeding supplement (NEPRO CARB STEADY) 1,000 mL (09/25/20 0609)    Scheduled Medications:  amiodarone  200 mg Per Tube Daily   apixaban  5 mg Per Tube BID   buPROPion  75 mg Per Tube BID   chlorhexidine gluconate (MEDLINE KIT)  15 mL Mouth Rinse BID   Chlorhexidine Gluconate Cloth  6 each Topical Daily   Chlorhexidine Gluconate Cloth  6 each Topical Q0600   darbepoetin (ARANESP) injection - DIALYSIS  100 mcg Intravenous Q Tue-HD   feeding supplement (PROSource TF)  45 mL Per Tube TID   guaiFENesin  5 mL Per Tube BID   insulin aspart  0-9 Units Subcutaneous Q4H   insulin glargine-yfgn  30 Units Subcutaneous Daily   lidocaine  1 patch Transdermal Q24H   mouth rinse  15 mL Mouth Rinse 10 times per day   metaxalone  800 mg Per Tube TID   metroNIDAZOLE  500 mg Per Tube BID   midodrine  5 mg Per Tube TID   multivitamin  1 tablet Per Tube QHS   pantoprazole sodium  40 mg Per Tube QHS   sertraline  100 mg Per Tube Daily   simethicone  40 mg Per Tube QID   sodium chloride flush  10-40 mL Intracatheter Q12H    have reviewed scheduled and prn medications.  Physical Exam:  General:NAD, arousable, tracheostomy on  vent. Heart:RRR, s1s2 nl Lungs: trach, no increased work of breathing. Abdomen:soft, Non-tender, PEG tube and ostomy bag Extremities:Trace lower extremities edema Dialysis Access: LIJ TDC, LUE AVG T/B+, swelling of the left upper extremity noted (stable)  Clio Gerhart 09/26/2020,7:32 AM  LOS: 77 days

## 2020-09-27 DIAGNOSIS — J9611 Chronic respiratory failure with hypoxia: Secondary | ICD-10-CM | POA: Diagnosis not present

## 2020-09-27 DIAGNOSIS — N186 End stage renal disease: Secondary | ICD-10-CM | POA: Diagnosis not present

## 2020-09-27 DIAGNOSIS — R140 Abdominal distension (gaseous): Secondary | ICD-10-CM | POA: Diagnosis not present

## 2020-09-27 DIAGNOSIS — J9601 Acute respiratory failure with hypoxia: Secondary | ICD-10-CM | POA: Diagnosis not present

## 2020-09-27 LAB — GLUCOSE, CAPILLARY
Glucose-Capillary: 107 mg/dL — ABNORMAL HIGH (ref 70–99)
Glucose-Capillary: 130 mg/dL — ABNORMAL HIGH (ref 70–99)
Glucose-Capillary: 137 mg/dL — ABNORMAL HIGH (ref 70–99)
Glucose-Capillary: 137 mg/dL — ABNORMAL HIGH (ref 70–99)
Glucose-Capillary: 144 mg/dL — ABNORMAL HIGH (ref 70–99)
Glucose-Capillary: 173 mg/dL — ABNORMAL HIGH (ref 70–99)

## 2020-09-27 MED ORDER — PANTOPRAZOLE SODIUM 40 MG PO PACK
40.0000 mg | PACK | Freq: Every day | ORAL | Status: DC
  Filled 2020-09-27: qty 20

## 2020-09-27 MED ORDER — PANTOPRAZOLE 2 MG/ML SUSPENSION: 40.0000 mg | Freq: Every day | ORAL | Status: DC

## 2020-09-27 MED ORDER — PANTOPRAZOLE 2 MG/ML SUSPENSION
40.0000 mg | Freq: Every day | ORAL | Status: DC
  Administered 2020-09-27 – 2020-10-20 (×24): 40 mg
  Filled 2020-09-27 (×24): qty 20

## 2020-09-27 NOTE — Progress Notes (Signed)
PROGRESS NOTE    Luis Orr   MBT:[id number]  DOB: August 23, 1950  DOA: 07/10/2020 PCP: Townsend Roger, MD   Brief Narrative:  Luis Orr is a 70 year old male with C3-C5 compressive myelopathy and subsequent quadriplegia who is chronically on a trach and vent.  He presented from [facility] facility with altered mental status hypotension and pneumonia with resultant septic shock and was admitted to the ICU.  He is noted to have multiple admissions for infections including bacteremia, UTI and pneumonia and has grown out multidrug-resistant Klebsiella, MRSA and Pseudomonas.  His most recent admission was in 9/21 due to multidrug-resistant Klebsiella, right ureteral calculus requiring a double-J stent and was treated with meropenem.  He was discharged back to [facility].  The patient was admitted to the ICU on 6/29 and treated for septic shock. 7/2 ID Consult for MDR acinetobacter in trach asp, Meropenem changed to unasyn 7/2 PRBC transfusion, iron replacement 7/5 Pressors added again. Central line placed. Drainage from gastrostomy tube >> CT Abd without fluid collection around gastrostomy tube but with diffuse body wall edema. Echo EF 50-55%, mild LVH 7/6 Renal Replacement Therapy ; Code status changed to partial Code (DNR) 7/7 Transfuse 1U PRBC 7/9 add solu cortef 7/10 off pressors, weaning stress steroids 7/11 Pressors added back on again 7/12 ID consult due to rising WBC, hypothermia, hypotension concern for worsening infection. Vancomycin added. Repeat blood cx collected. 7/15 CRRT stopped 7/17 Restarted on low dose levo. No evidence of new infection. Held off on antibiotics. Nephrology contacted family and decision was made to continue iHD this week to allow additional time for renal recovery. Patient is not the best long term iHD candidate but family wants aggressive care 7/18 plans for iHD, tolerated with 2L removed 719 slight drop in hgb to 6.9 will transfuse 1 unit  PRBC 7/22 > 7/26: Tolerating HD. TOC consulted for placement. Will likely need out of state placement. 8/15 WBCs greater than 28,000, hypoglycemia, procalcitonin >18, chest x-ray consistent with pneumonia, recent blood cultures consistent with contamination.  Unasyn IV resumed after sputum culture obtained 8/16 consultation placed with ethics committee regarding evaluation for futility of care 8/16 Sputum cx + for Acinetobacter with sensitivities pending 8/17 extensive conversation held with patient's wife regarding patient's poor prognosis and reemergence of Acinetobacter VAP.  Also discussed with her that patient has been able to convey to multiple staff that he does not want to continue on the ventilator or with dialysis.  Wife reports that the patient does not understand what he is saying, she is the POA and that she wants to continue aggressive care. 8/18 patient now telling nephrology, myself and the rest of the staff he replies yes to wanting to remain on dialysis and on the ventilator and this direct question is asked. 8/18 Acinetobacter sensitive to Unasyn only.  Discussed with ID/Dr. Juleen China.  Plan is to administer antibiotics for 7-day since patient has not had any increased O2 needs and remains afebrile.  If any concerns over worsening symptoms can give an additional 3 days. Developed moderate bleeding around tracheostomy site on 8/19.  Likely related to combination of Eliquis and heparin required for CRRT.  We will hold Eliquis for now and monitor degree of bleeding Eliquis resumed on 8/22.  Overnight into 8/23 patient bit his tongue and had bleeding.  Also had some bleeding around trach likely from oral bleeding.  Also though did have bleeding from old central line site. 8/23 had transient bleeding around trach and prior  central line insertion site overnight.  Patient had bitten tongue and this likely explain the bleeding.  This morning patient was not having any bleeding.  On 8/25 had  redeveloped slow ooze bleeding from mouth and trach.  Eliquis discontinued in favor of IV heparin especially given patient will undergo surgical procedure for vascular access on 8/29 8/30 Left FA AVG occluded 9/2 status post thrombectomy and revision of LUE AV graft 9/4 LUE edema with negative venous duplex. 9/5 heparin discontinued and Eliquis resumed     Subjective:  No complaints    Assessment & Plan:   Principal Problem:   Acute respiratory failure with hypoxia superimposed on chronic respiratory failure Recurrent ventilator associated pneumonias Septic shock -Developed Acinetobacter pneumonia on 8/14 and completed 7 days of Unasyn -Remains on the ventilator and is stable at this time -Appreciate PCCM managing this patient's ventilator  Active Problems: AKI with oliguria Now end-stage renal disease - initially suspected to be ATN related to septic shock -Unfortunately, renal recovery has not occurred -Imaging revealed marked atrophy of the right kidney- Double-J ureteral stent is still present - Appreciate nephrology management of this-she continues to require dialysis via HD catheter on Tuesday Thursday Saturdays - Has left arm AV graft that was placed on 8/29 however, this has clotted-he had a thrombectomy and revision on 9/2 - Graft will need to mature for 4 weeks after revision before it can be utilized - Midodrine being used for hypotension and tachycardia  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 into the sacrum- (POA) - stage 2 pressure ulcer right heel - CT abdomen and pelvis performed on the 12th for further work-up of leukocytosis reveals that the sacral decubitus ulcer is eroding into the distal sacrum with gas tracking into the right gluteus maximus muscle-no fluid noted - large amount of blood noted on dressing - no pus, but foul odor noted- will ask general surgery opinion on care  Persistently elevated WBC count - WBC count has been continued continuously  elevated but fluctuates and has been as high as the 30s - no fevers noted- no other signs of sepsis (tachycardia/ tachypnea)  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 PEG tube- severe protein calorie malnutrition -Continue tube feeds and supportive care -Receiving Skelaxin  Left upper arm edema - Related to above-mentioned clotted graft which is status post revision  A-fib, paroxysmal - cont Amiodarone  Time spent in minutes: 30 DVT prophylaxis: Place and maintain sequential compression device Start: 07/31/20 1614 apixaban (ELIQUIS) tablet 5 mg  Code Status: Partial- No CPR- Intubate, use BiPAP, give ACLS meds Family Communication:  Level of Care: Level of care: ICU Disposition Plan:  Status is: Inpatient  Remains inpatient appropriate because:Unsafe d/c plan  Dispo: The patient is from:  [facility]              Anticipated d/c is to:  TBD              Patient currently is not medically  stable to d/c.   Difficult to place patient No  Consultants:  Pulmonary critical care Nephrology Infectious disease Palliative medicine  Procedures:  HD catheter AV fistula with declotting and revision   Objective: Vitals:   09/27/20 0600 09/27/20 0659 09/27/20 0819 09/27/20 1036  BP: (!) 147/80     Pulse: 79 80  84  Resp: 20 20  20   Temp:   97.6 F (36.4 C)   TempSrc:   Axillary   SpO2: 100% 100%  100%  Weight:       Height:        Intake/Output Summary (Last 24 hours) at 09/27/2020 1159 Last data filed at 09/27/2020 0500 Gross per 24 hour  Intake 875 ml  Output 200 ml  Net 675 ml    Filed Weights   09/25/20 0500 09/26/20 0500 09/27/20 0500  Weight: 81.4 kg 82.4 kg 82.4 kg    Examination: General exam: Appears comfortable  HEENT: PERRLA, oral mucosa moist, no sclera icterus or thrush- trach noted Respiratory system: Clear to auscultation. Respiratory effort normal. Cardiovascular system: S1 & S2 heard, regular rate and rhythm Gastrointestinal system: Abdomen soft, non-tender, nondistended. Normal bowel sounds  - liquid stool in colostomy Central nervous system: Alert - able to track finger Extremities: No cyanosis, clubbing - persistent edema of left arm Skin: tunneling sacral ulcer, stage 2 on right heal          Data Reviewed: I have personally reviewed following labs and imaging studies  CBC: Recent Labs  Lab 09/23/20 0755 09/23/20 1542 09/24/20 0425 09/25/20 0347  WBC 22.7* 22.5* 25.4* 19.6*  HGB 8.3* 10.2* 7.8* 8.0*  HCT 25.2* 31.5* 24.5* 24.6*  MCV 78.8* 78.2* 79.3* 77.1*  PLT 421* 510* 420* 426*    Basic Metabolic Panel: Recent Labs  Lab 09/23/20 0755 09/24/20 0425 09/26/20 0952  NA 129* 130* 127*  K 4.3 3.9 4.2  CL 92* 95* 88*  CO2 23 23 20*  GLUCOSE 146* 165* 122*  BUN 101* 59* 119*  CREATININE 1.56* 1.11 1.66*  CALCIUM 9.7 9.0 9.3  PHOS  --  3.6 6.7*    GFR: Estimated Creatinine Clearance: 41.7 mL/min (A) (by C-G formula based on SCr of 1.66 mg/dL (H)). Liver Function Tests: Recent Labs  Lab 09/24/20 0425 09/26/20 0952  ALBUMIN 1.5* 1.7*    No results for input(s): LIPASE, AMYLASE in the last 168 hours. No results for input(s): AMMONIA in the last 168 hours. Coagulation Profile: No results for input(s): INR, PROTIME in the last 168 hours. Cardiac Enzymes: No results for input(s): CKTOTAL, CKMB, CKMBINDEX, TROPONINI in the last 168  hours. BNP (last 3 results) No results for input(s): PROBNP in the last 8760 hours. HbA1C: No results for input(s): HGBA1C in the last 72 hours. CBG: Recent Labs  Lab 09/26/20 1602 09/26/20 2001 09/27/20 0031 09/27/20 0448 09/27/20 0810  GLUCAP 137* 130* 130* 137* 173*    Lipid Profile: No results for input(s): CHOL, HDL, LDLCALC, TRIG, CHOLHDL, LDLDIRECT in the last 72 hours. Thyroid Function Tests: No results for input(s): TSH, T4TOTAL, FREET4, T3FREE, THYROIDAB in the last 72 hours. Anemia Panel: No results for input(s): VITAMINB12, FOLATE, FERRITIN, TIBC, IRON, RETICCTPCT in the last 72 hours. Urine analysis:    Component Value Date/Time   COLORURINE YELLOW 07/25/2020 [place] 07/25/2020 0334   LABSPEC 1.020 07/25/2020 0334   PHURINE 6.5 07/25/2020 0334   GLUCOSEU 100 (A) 07/25/2020 0334   HGBUR LARGE (A) 07/25/2020 [place]  SMALL (A) 07/25/2020 0334   KETONESUR NEGATIVE 07/25/2020 0334   PROTEINUR >300 (A) 07/25/2020 0334   NITRITE NEGATIVE 07/25/2020 0334   LEUKOCYTESUR MODERATE (A) 07/25/2020 0334   Sepsis Labs: @LABRCNTIP (procalcitonin:4,lacticidven:4) )No results found for this or any previous visit (from the past 240 hour(s)).       Radiology Studies: No results found.    Scheduled Meds:  amiodarone  200 mg Per Tube Daily   apixaban  5 mg Per Tube BID   buPROPion  75 mg Per Tube BID   chlorhexidine gluconate (MEDLINE KIT)  15 mL Mouth Rinse BID   Chlorhexidine Gluconate Cloth  6 each Topical Daily   Chlorhexidine Gluconate Cloth  6 each Topical Q0600   darbepoetin (ARANESP) injection - DIALYSIS  100 mcg Intravenous Q Tue-HD   feeding supplement (PROSource TF)  45 mL Per Tube TID   guaiFENesin  5 mL Per Tube BID   insulin aspart  0-9 Units Subcutaneous Q4H   insulin glargine-yfgn  30 Units Subcutaneous Daily   lidocaine  1 patch Transdermal Q24H   mouth rinse  15 mL Mouth Rinse 10 times per day   metaxalone  800 mg  Per Tube TID   midodrine  5 mg Per Tube TID   multivitamin  1 tablet Per Tube QHS   pantoprazole sodium  40 mg Per Tube QHS   sertraline  100 mg Per Tube Daily   simethicone  40 mg Per Tube QID   sodium chloride flush  10-40 mL Intracatheter Q12H   Continuous Infusions:  sodium chloride 10 mL/hr at 10/08/2020 1429   sodium chloride 500 mL (09/01/20 1408)   sodium chloride     sodium chloride     feeding supplement (NEPRO CARB STEADY) 1,000 mL (09/26/20 0935)     LOS: 78 days      Debbe Odea, MD [facility] Pager: [email or web address] 09/27/2020, 11:59 AM

## 2020-09-27 NOTE — Consult Note (Signed)
[facility] Surgery Consult Note  Luis Orr 1950/05/21  [id number].    Requesting MD: Debbe Odea Chief Complaint: Sepsis Reason for Consult: Sacral decubitus HPI:  Patient is an extremely complicated 70 year old male with a history of C3-C5 compressive myelopathy and subsequent quadriplegia.  He is trach and ventilator dependent.  He has been at [place] and developed altered mental status with hypotension, pneumonia and septic shock.  He has had multiple acute hospitalizations for UTIs and pneumonias.  He was most recently hospitalized at [facility] on 06/28/2020 with current sepsis.  His most current problem list includes: Acute respiratory failure with hypoxia per imposed on chronic respiratory failure, recurrent ventilator associated pneumonias.  AKI with a clean area now end-stage renal disease on hemodialysis, AV Gore-Tex graft placed 8/29 with thrombectomy on 09/16/2020.  Ongoing leukocytosis, diverticulitis, history of Hartman's pouch with colostomy, insulin-dependent diabetes, severe malnutrition with PEG, and a large stage IV sacral decubitus.  ROS: Review of Systems  Unable to perform ROS: Other  Pt with trach and on the ventilator  History reviewed. No pertinent family history.  Past Medical History:  Diagnosis Date   Anemia    Anxiety    Chronic respiratory failure (HCC)    DM2 (diabetes mellitus, type 2) (HCC)    Functional quadriplegia (HCC)    Hypertension    Multiple drug resistant organism (MDRO) culture positive    Urine retention    UTI (urinary tract infection)     Past Surgical History:  Procedure Laterality Date   AV FISTULA PLACEMENT Left 08/30/2020   Procedure: INSERTION OF LEFT UPPER ARM ARTERIOVENOUS (AV) GORE-TEX GRAFT;  Surgeon: Cherre Robins, MD;  Location: [place];  Service: Vascular;  Laterality: Left;   COLOSTOMY     CYSTOSCOPY WITH STENT PLACEMENT Right 10/08/2019   Procedure: CYSTOSCOPY WITH  STENT  PLACEMENT;  Surgeon: Lucas Mallow, MD;  Location: [place];  Service: Urology;  Laterality: Right;   IR FLUORO GUIDE CV LINE LEFT  08/28/2020   IR FLUORO GUIDE CV LINE RIGHT  08/07/2020   IR FLUORO GUIDE CV LINE RIGHT  08/07/2020   IR REPLACE G-TUBE SIMPLE WO FLUORO  09/07/2020   IR US GUIDE VASC ACCESS LEFT  08/28/2020   IR US GUIDE VASC ACCESS RIGHT  08/07/2020   PEG PLACEMENT     ROTATOR CUFF REPAIR     THROMBECTOMY AND REVISION OF ARTERIOVENTOUS (AV) GORETEX  GRAFT Left 09/27/2020   Procedure: ARTERIOVENIOUS REVISION AND THROMBECTOMY LEFT ARM;  Surgeon: Serafina Mitchell, MD;  Location: [facility];  Service: Vascular;  Laterality: Left;   TRACHEOSTOMY      Social History:  reports that he has never smoked. He has never used smokeless tobacco. No history on file for alcohol use and drug use.  Allergies:  Allergies  Allergen Reactions   Adhesive [Tape] Rash    Medications Prior to Admission  Medication Sig Dispense Refill   acetaminophen (TYLENOL) 325 MG suppository Place 650 mg rectally every 6 (six) hours as needed for mild pain or fever (.=101).     Amino Acids-Protein Hydrolys (FEEDING SUPPLEMENT, PRO-STAT 64,) LIQD Take 30 mLs by mouth daily.     amLODipine (NORVASC) 5 MG tablet Place 5 mg into feeding tube daily.     buPROPion (WELLBUTRIN) 75 MG tablet Place 75 mg into feeding tube 2 (two) times daily.     [EXPIRED] cefTRIAXone (ROCEPHIN) IVPB Inject 1 g into the vein daily.  glycopyrrolate (ROBINUL) 1 MG tablet Place 1 mg into feeding tube every 8 (eight) hours.     losartan (COZAAR) 100 MG tablet Place 100 mg into feeding tube daily.     nutrition supplement, JUVEN, (JUVEN) PACK Place 1 packet into feeding tube in the morning and at bedtime.     Polyethyl Glycol-Propyl Glycol (SYSTANE) 0.4-0.3 % SOLN Place 1 drop into both eyes 2 (two) times daily.     rivaroxaban (XARELTO) 20 MG TABS tablet Take 20 mg by mouth daily with supper.     sodium zirconium cyclosilicate (LOKELMA) 10 g  PACK packet Place 10 g into feeding tube daily.     [EXPIRED] tobramycin, PF, (TOBI) 300 MG/5ML nebulizer solution Take 300 mg by nebulization 2 (two) times daily.     acetaminophen (TYLENOL) 325 MG tablet Place 650 mg into feeding tube every 8 (eight) hours as needed for mild pain, fever or headache.     amiodarone (PACERONE) 200 MG tablet Place 200 mg into feeding tube daily.     carvedilol (COREG) 6.25 MG tablet Place 6.25 mg into feeding tube every 12 (twelve) hours. (Patient not taking: Reported on 07/12/2020)     chlorhexidine (PERIDEX) 0.12 % solution 5 mLs by Mouth Rinse route every 12 (twelve) hours.      clonazePAM (KLONOPIN) 0.5 MG tablet Place 1 tablet (0.5 mg total) into feeding tube every 8 (eight) hours. (Patient taking differently: Place 0.5 mg into feeding tube 2 (two) times daily.) 30 tablet 0   diltiazem (CARDIZEM) 60 MG tablet Place 60 mg into feeding tube every 6 (six) hours.     docusate (COLACE) 50 MG/5ML liquid Place 10 mLs (100 mg total) into feeding tube every 12 (twelve) hours. 100 mL 0   famotidine (PEPCID) 20 MG tablet Place 1 tablet (20 mg total) into feeding tube at bedtime. (Patient taking differently: Place 20 mg into feeding tube 2 (two) times daily.) 30 tablet 0   fentaNYL (DURAGESIC) 25 MCG/HR Place 1 patch onto the skin every 3 (three) days. 2 patch 0   gabapentin (NEURONTIN) 250 MG/5ML solution Place 6 mLs (300 mg total) into feeding tube 2 (two) times daily. 60 mL 0   gabapentin (NEURONTIN) 300 MG capsule Place 300 mg into feeding tube every 12 (twelve) hours.     HYDROcodone-acetaminophen (NORCO/VICODIN) 5-325 MG tablet Place 1 tablet into feeding tube every 6 (six) hours as needed for moderate pain.     insulin glargine (LANTUS) 100 UNIT/ML injection Inject 30 Units into the skin daily.     insulin regular (NOVOLIN R) 100 units/mL injection Inject 2-10 Units into the skin See admin instructions. Inject 2-10 units into the skin every 6 hours, per sliding scale:  BGL 151-200 = 2 units, 201-250= 4 units, 251-300= 6 units, 301-350= 8 units, 351-400 = 10 units and call MD if <60 or >400     ipratropium-albuterol (DUONEB) 0.5-2.5 (3) MG/3ML SOLN Take 3 mLs by nebulization every 4 (four) hours as needed (for shortness of breath).     lansoprazole (PREVACID) 30 MG capsule Place 30 mg into feeding tube daily at 12 noon. (Patient not taking: Reported on 07/12/2020)     lisinopril (ZESTRIL) 5 MG tablet Take 1 tablet (5 mg total) by mouth daily. (Patient not taking: Reported on 12/19/2019) 30 tablet 0   LORazepam (ATIVAN) 2 MG/ML injection Inject 0.25 mLs (0.5 mg total) into the vein every 4 (four) hours as needed for anxiety. Replace with clonazepam once enteral absorption confirmed. (  Patient not taking: Reported on 12/19/2019) 1 mL 0   Multiple Vitamin (MULTIVITAMIN WITH MINERALS) TABS tablet Take 1 tablet by mouth daily. (Patient taking differently: Place 1 tablet into feeding tube daily. '18mg'$  iron-462mg-25mcg) 30 tablet 0   Nutritional Supplements (FEEDING SUPPLEMENT, OSMOLITE 1.5 CAL,) LIQD Place 1,000 mLs into feeding tube continuous. (Patient taking differently: Place 1,000 mLs into feeding tube continuous. Infuse at 50 ml/hr.) 1000 mL 0   Olopatadine HCl 0.2 % SOLN Place 1 drop into both eyes daily. (Patient not taking: Reported on 07/12/2020)     ondansetron (ZOFRAN) 4 MG/2ML SOLN injection Inject 2 mLs (4 mg total) into the vein every 6 (six) hours as needed for nausea. 2 mL 0   pantoprazole (PROTONIX) 40 MG injection Inject 40 mg into the vein at bedtime. (Patient not taking: Reported on 12/19/2019) 1 each 0   polyethylene glycol (MIRALAX / GLYCOLAX) 17 g packet Take 17 g by mouth daily. (Patient taking differently: Place 17 g into feeding tube daily. ) 14 each 0   scopolamine (TRANSDERM-SCOP) 1 MG/3DAYS Place 1 patch onto the skin every 3 (three) days.     sertraline (ZOLOFT) 100 MG tablet Place 1 tablet (100 mg total) into feeding tube at bedtime. 30 tablet 0    Sodium Chloride Flush (NORMAL SALINE FLUSH IV) Inject 10 mLs into the vein See admin instructions. 10 ml's as needed before and after medications      Blood pressure (!) 147/80, pulse 84, temperature 97.6 F (36.4 C), temperature source Axillary, resp. rate 20, height '5\' 6"'$  (1.676 m), weight 82.4 kg, SpO2 100 %. Physical Exam:  General: Elderly chronically ill African-American male who is laying in bed in NAD.  Tracheostomy in place on the ventilator. HEENT: head is normocephalic, atraumatic.  Sclera are noninjected.  Pupils are equal   Ears and nose without any masses or lesions.  Mouth is pink and moist Heart: regular, rate, and rhythm.  Normal s1,s2. No obvious murmurs, gallops, or rubs noted.  Palpable radial and pedal pulses bilaterally Lungs: CTAB, no wheezes, rhonchi, or rales noted.  Respiratory effort nonlabored Abd: soft, NT, functional colostomy and gastrostomy tube in place, multiple abdominal incisions MS: all 4 extremities are symmetrical with no cyanosis, or clubbing.  Some edema.  Skin: warm and dry with no masses, lesions, or rashes.  Left forearm AVG. Sacral decubitus measures about 5 cm in diameter.  About 6 cm deep, with 4 cm of undermining circumferentially.  There was some necrotic muscle at the base.  You can feel bone at the base.  There had been some bleeding but this is stopped now. Neuro: Patient is alert and follows.  He is quadriplegic. Psych: Patient is alert, but could not answer any questions      Results for orders placed or performed during the hospital encounter of 07/02/2020 (from the past 48 hour(s))  Glucose, capillary     Status: Abnormal   Collection Time: 09/25/20  3:06 PM  Result Value Ref Range   Glucose-Capillary 125 (H) 70 - 99 mg/dL    Comment: Glucose reference range applies only to samples taken after fasting for at least 8 hours.  Glucose, capillary     Status: Abnormal   Collection Time: 09/25/20  7:14 PM  Result Value Ref Range    Glucose-Capillary 118 (H) 70 - 99 mg/dL    Comment: Glucose reference range applies only to samples taken after fasting for at least 8 hours.  Glucose, capillary  Status: Abnormal   Collection Time: 09/25/20 11:17 PM  Result Value Ref Range   Glucose-Capillary 135 (H) 70 - 99 mg/dL    Comment: Glucose reference range applies only to samples taken after fasting for at least 8 hours.  Glucose, capillary     Status: Abnormal   Collection Time: 09/26/20  3:52 AM  Result Value Ref Range   Glucose-Capillary 125 (H) 70 - 99 mg/dL    Comment: Glucose reference range applies only to samples taken after fasting for at least 8 hours.  Glucose, capillary     Status: Abnormal   Collection Time: 09/26/20  8:08 AM  Result Value Ref Range   Glucose-Capillary 108 (H) 70 - 99 mg/dL    Comment: Glucose reference range applies only to samples taken after fasting for at least 8 hours.  Renal function panel     Status: Abnormal   Collection Time: 09/26/20  9:52 AM  Result Value Ref Range   Sodium 127 (L) 135 - 145 mmol/L   Potassium 4.2 3.5 - 5.1 mmol/L   Chloride 88 (L) 98 - 111 mmol/L   CO2 20 (L) 22 - 32 mmol/L   Glucose, Bld 122 (H) 70 - 99 mg/dL    Comment: Glucose reference range applies only to samples taken after fasting for at least 8 hours.   BUN 119 (H) 8 - 23 mg/dL   Creatinine, Ser 1.66 (H) 0.61 - 1.24 mg/dL   Calcium 9.3 8.9 - 10.3 mg/dL   Phosphorus 6.7 (H) 2.5 - 4.6 mg/dL   Albumin 1.7 (L) 3.5 - 5.0 g/dL   GFR, Estimated 44 (L) >60 mL/min    Comment: (NOTE) Calculated using the CKD-EPI Creatinine Equation (2021)    Anion gap 19 (H) 5 - 15    Comment: Performed at [facility] [address], [place], [place] [address]  Glucose, capillary     Status: Abnormal   Collection Time: 09/26/20 11:05 AM  Result Value Ref Range   Glucose-Capillary 104 (H) 70 - 99 mg/dL    Comment: Glucose reference range applies only to samples taken after fasting for at least 8 hours.  Glucose,  capillary     Status: Abnormal   Collection Time: 09/26/20  4:02 PM  Result Value Ref Range   Glucose-Capillary 137 (H) 70 - 99 mg/dL    Comment: Glucose reference range applies only to samples taken after fasting for at least 8 hours.  Glucose, capillary     Status: Abnormal   Collection Time: 09/26/20  8:01 PM  Result Value Ref Range   Glucose-Capillary 130 (H) 70 - 99 mg/dL    Comment: Glucose reference range applies only to samples taken after fasting for at least 8 hours.  Glucose, capillary     Status: Abnormal   Collection Time: 09/27/20 12:31 AM  Result Value Ref Range   Glucose-Capillary 130 (H) 70 - 99 mg/dL    Comment: Glucose reference range applies only to samples taken after fasting for at least 8 hours.  Glucose, capillary     Status: Abnormal   Collection Time: 09/27/20  4:48 AM  Result Value Ref Range   Glucose-Capillary 137 (H) 70 - 99 mg/dL    Comment: Glucose reference range applies only to samples taken after fasting for at least 8 hours.  Glucose, capillary     Status: Abnormal   Collection Time: 09/27/20  8:10 AM  Result Value Ref Range   Glucose-Capillary 173 (H) 70 - 99  mg/dL    Comment: Glucose reference range applies only to samples taken after fasting for at least 8 hours.  Glucose, capillary     Status: Abnormal   Collection Time: 09/27/20 12:06 PM  Result Value Ref Range   Glucose-Capillary 137 (H) 70 - 99 mg/dL    Comment: Glucose reference range applies only to samples taken after fasting for at least 8 hours.   No results found.   sodium chloride 10 mL/hr at 10/03/2020 1429   sodium chloride 500 mL (09/01/20 1408)   sodium chloride     sodium chloride     feeding supplement (NEPRO CARB STEADY) 1,000 mL (09/27/20 1355)   Anti-infectives (From admission, onward)    Start     Dose/Rate Route Frequency Ordered Stop   09/24/20 2200  metroNIDAZOLE (FLAGYL) 50 mg/ml oral suspension 500 mg  Status:  Discontinued        500 mg Per Tube 2 times daily  09/24/20 2045 09/26/20 0853   09/24/20 2130  cefTRIAXone (ROCEPHIN) 1 g in sodium chloride 0.9 % 100 mL IVPB  Status:  Discontinued        1 g 200 mL/hr over 30 Minutes Intravenous Every 24 hours 09/24/20 2043 09/24/20 2058   09/24/20 2130  cefTRIAXone (ROCEPHIN) 2 g in sodium chloride 0.9 % 100 mL IVPB  Status:  Discontinued        2 g 200 mL/hr over 30 Minutes Intravenous Every 24 hours 09/24/20 2059 09/26/20 0853   09/08/2020 0600  cefUROXime (ZINACEF) 1.5 g in sodium chloride 0.9 % 100 mL IVPB        1.5 g 200 mL/hr over 30 Minutes Intravenous On call to O.R. 09/09/20 [id number] 09/11/20 0559   08/29/20 2200  Ampicillin-Sulbactam (UNASYN) 3 g in sodium chloride 0.9 % 100 mL IVPB        3 g 200 mL/hr over 30 Minutes Intravenous Every 12 hours 08/29/20 1112 09/03/20 2141   08/29/20 1000  Ampicillin-Sulbactam (UNASYN) 3 g in sodium chloride 0.9 % 100 mL IVPB  Status:  Discontinued        3 g 200 mL/hr over 30 Minutes Intravenous Every 24 hours 08/29/20 0814 08/29/20 1112   08/27/20 1400  Ampicillin-Sulbactam (UNASYN) 3 g in sodium chloride 0.9 % 100 mL IVPB  Status:  Discontinued        3 g 200 mL/hr over 30 Minutes Intravenous Every 8 hours 08/27/20 1305 08/27/20 1308   08/27/20 1400  Ampicillin-Sulbactam (UNASYN) 3 g in sodium chloride 0.9 % 100 mL IVPB  Status:  Discontinued        3 g 200 mL/hr over 30 Minutes Intravenous Every 24 hours 08/27/20 1308 08/29/20 0814   08/07/20 1012  ceFAZolin (ANCEF) IVPB 2g/100 mL premix        over 30 Minutes Intravenous Continuous PRN 08/07/20 1023 08/07/20 1012   08/07/20 0000  ceFAZolin (ANCEF) IVPB 2g/100 mL premix        2 g 200 mL/hr over 30 Minutes Intravenous To Radiology 08/05/20 1244 08/07/20 0013   08/06/20 0000  ceFAZolin (ANCEF) IVPB 2g/100 mL premix  Status:  Discontinued        2 g 200 mL/hr over 30 Minutes Intravenous To Radiology 08/05/20 1201 08/05/20 1244   07/25/20 1700  vancomycin (VANCOCIN) IVPB 1000 mg/200 mL premix  Status:   Discontinued        1,000 mg 200 mL/hr over 60 Minutes Intravenous Every 24 hours 07/24/20 1606 07/27/20 0815  07/24/20 1700  vancomycin (VANCOREADY) IVPB 1750 mg/350 mL        1,750 mg 175 mL/hr over 120 Minutes Intravenous  Once 07/24/20 1606 07/24/20 2005   07/19/20 1000  Ampicillin-Sulbactam (UNASYN) 3 g in sodium chloride 0.9 % 100 mL IVPB        3 g 200 mL/hr over 30 Minutes Intravenous Every 6 hours 07/19/20 0848 07/28/20 0342   07/18/20 1200  Ampicillin-Sulbactam (UNASYN) 3 g in sodium chloride 0.9 % 100 mL IVPB  Status:  Discontinued        3 g 200 mL/hr over 30 Minutes Intravenous Every 8 hours 07/18/20 0720 07/19/20 0848   07/14/20 1530  Ampicillin-Sulbactam (UNASYN) 3 g in sodium chloride 0.9 % 100 mL IVPB  Status:  Discontinued        3 g 200 mL/hr over 30 Minutes Intravenous Every 6 hours 07/14/20 1440 07/18/20 0720   07/13/20 2000  meropenem (MERREM) 2 g in sodium chloride 0.9 % 100 mL IVPB  Status:  Discontinued        2 g 200 mL/hr over 30 Minutes Intravenous Every 12 hours 07/13/20 1433 07/14/20 1440   07/13/20 1432  vancomycin variable dose per unstable renal function (pharmacist dosing)  Status:  Discontinued         Does not apply See admin instructions 07/13/20 1433 07/15/20 0937   07/13/20 1400  vancomycin (VANCOCIN) IVPB 750 mg/150 ml premix  Status:  Discontinued        750 mg 150 mL/hr over 60 Minutes Intravenous Every 12 hours 07/13/20 1321 07/13/20 1430   07/12/20 1248  vancomycin (VANCOREADY) IVPB 750 mg/150 mL  Status:  Discontinued        750 mg 150 mL/hr over 60 Minutes Intravenous Every 12 hours 07/12/20 1248 07/13/20 1321   06/14/2020 2300  meropenem (MERREM) 2 g in sodium chloride 0.9 % 100 mL IVPB  Status:  Discontinued        2 g 200 mL/hr over 30 Minutes Intravenous Every 8 hours 06/22/2020 2209 07/13/20 1433   06/25/2020 2205  vancomycin variable dose per unstable renal function (pharmacist dosing)  Status:  Discontinued         Does not apply See admin  instructions 06/20/2020 2209 07/12/20 1248   06/28/2020 1845  vancomycin (VANCOREADY) IVPB 1500 mg/300 mL        1,500 mg 150 mL/hr over 120 Minutes Intravenous  Once 07/07/2020 1843 07/07/2020 2132   06/13/2020 1845  ceFEPIme (MAXIPIME) 2 g in sodium chloride 0.9 % 100 mL IVPB        2 g 200 mL/hr over 30 Minutes Intravenous  Once 06/23/2020 1843 06/22/2020 1952        Assessment/Plan Recurrent sepsis Sacral decubitus C3-C5 compressive myopathy with quadriplegia Chronic hypoxic respiratory failure ventilator dependent/tracheostomy Recurrent pneumonia/UTI sepsis Recurrent septicemia End-stage renal disease now on dialysis Insulin-dependent diabetes Partial code  FEN: N.p.o./tube feed ID: Antibiotics listed above DVT: Eliquis  Plan: We sharply debrided some of the dead muscle at the base at the bedside using scissors.  We repacked the open site wet to dry.  I would do the wet-to-dry dressings dressings twice daily.  If he bleeds again I would just do some compression at the site to control it.  With his quadriplegia, chronic hypoxic respiratory failure/ventilator dependent, malnutrition, severe debilitation, I doubt this will ever heal.  I think the best we can do is to keep it clean and packed as you are currently doing.  I have no further recommendations at this time.    Earnstine Regal [facility] Surgery 09/27/2020, 12:41 PM Please see Amion for pager number during day hours 7:00am-4:30pm

## 2020-09-27 NOTE — Progress Notes (Signed)
Nutrition Follow-up  DOCUMENTATION CODES:   Not applicable  INTERVENTION:   Continue tube feeds via G-tube: - Nepro @ 45 ml/hr (1080 ml/day) - ProSource TF 45 ml TID   Provides 2064 kcal, 120 grams of protein, and 785 ml free water daily.   - Continue renal MVI daily per tube  NUTRITION DIAGNOSIS:   Increased nutrient needs related to wound healing as evidenced by estimated needs.  Ongoing, being addressed via TF  GOAL:   Patient will meet greater than or equal to 90% of their needs  Met via TF  MONITOR:   Vent status, Labs, Weight trends, TF tolerance, Skin, I & O's  REASON FOR ASSESSMENT:   Ventilator, Consult Enteral/tube feeding initiation and management  ASSESSMENT:   70 year old male who presented to the ED on 6/29 from [facility] with AMS and hypotension. 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 Pt admitted with septic shock, acute on chronic hypoxemic respiratory failure.  9/02 - s/p redo LUE AV graft  Patient is currently tolerating Nepro at 45 ml/hr with Prosource TF 45 ml TID via PEG which was replaced by IR on 8/26. Cortrak has since been removed.  Plan is for pt to receive iHD today. TOC team continues to work on placement.  Admit weight: 90.2 kg Current weight: 82.4 kg Lowest weight since admit: 72 kg on 8/3  Pt continues to have pitting edema to BUE and BLE.  Patient remains on ventilator support via trach MV: 10.3 L/min Temp (24hrs), Avg:97.8 F (36.6 C), Min:97.6 F (36.4 C), Max:97.9 F (36.6 C)  Medications reviewed and include: aranesp weekly, SSI q 4 hours, semglee 30 units daily, rena-vit, protonix, simethicone  Labs reviewed: sodium 127, phosphorus 6.7, hemoglobin 8.0 CBG's: 104-173 x 24 hours  Colostomy: 200 ml x 24 hours  Diet Order:   Diet Order              Diet NPO time specified Except for: Sips with Meds  Diet effective midnight                   EDUCATION NEEDS:   No education needs have been identified at this time  Skin:  Skin Assessment:Skin Integrity Issues: DTI: L ear Stage II: R heel Stage IV: sacrum Other: multiple skin tears  Last BM:  09/26/20 colostomy  Height:   Ht Readings from Last 1 Encounters:  09/24/20 _0  (1.676 m)    Weight:   Wt Readings from Last 1 Encounters:  09/27/20 82.4 kg    Ideal Body Weight:  58 kg (adjusted for quadriplegia)  BMI:  Body mass index is 29.32 kg/m.  Estimated Nutritional Needs:   Kcal:  1900-2100  Protein:  110-120 gm  Fluid:  1 L + UOP    Gustavus Bryant, MS, RD, LDN Inpatient Clinical Dietitian Please see AMiON for contact information.

## 2020-09-27 NOTE — Progress Notes (Signed)
[facility] PROGRESS NOTE  Assessment/ Plan:  # Acute kidney Injury-prolonged dialysis dependency, now progressed to ESRD:  ATN from septic shock-without any evidence of renal recovery to date . initiated on CRRT 07/18/20 and transitioned to IHD.  Has TDC, continue with HD on TTS schedule for now.  Tolerating dialysis well but unable to sit on chair or recliner. -hd today  # Vascular access - had left forearm AVG placed 09/01/2020 but unfortunately had clotted.  s/p thrombectomy and revision 9/2, graft can be used 4 weeks thereafter.  Swelling of left upper extremity noted continue arm elevation and supportive care.  # Septic shock/ventilator associated pneumonia due to recurrent Acinetobacter infection: Off antibiotics and stable hemodynamics.  #  Chronic respiratory failure: Status post tracheostomy with ventilator management per CCM.  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  # Chronic kidney disease/metabolic bone disease: Calcium and phosphorus level under control with hemodialysis, off binders  # Anemia: contiue ESA, monitor Hb.  Transfuse as needed.  # Acute metabolic encephalopathy: Continue management as above.,  Supportive care.  # Disposition - pt cannot return to [facility] now that he is ESRD. He is going to [facility] in [place], [place]. He needs to have [place] Medicaid & his AVG needs to be mature prior to being accepted. Other possible barrier is pt is a permanent trach & vent pt.     Subjective: Seen and examined in ICU. No acute events. Objective Vital signs in last 24 hours: Vitals:   09/27/20 0303 09/27/20 0400 09/27/20 0500 09/27/20 0659  BP:  (!) 168/91 (!) 164/80   Pulse:  80 82 80  Resp:  _0 Temp:  97.9 F (36.6 C)    TempSrc:  Oral    SpO2: 99% 98% 100% 100%  Weight:      Height:       Weight change:   Intake/Output Summary (Last 24 hours) at 09/27/2020 0723 Last data filed at 09/27/2020  0500 Gross per 24 hour  Intake 1055 ml  Output 200 ml  Net 855 ml       Labs: Basic Metabolic Panel: Recent Labs  Lab 09/20/20 0834 09/23/20 0755 09/24/20 0425 09/26/20 0952  NA 133* 129* 130* 127*  K 3.7 4.3 3.9 4.2  CL 95* 92* 95* 88*  CO2 _1 20*  GLUCOSE 133* 146* 165* 122*  BUN 79* 101* 59* 119*  CREATININE 1.32* 1.56* 1.11 1.66*  CALCIUM 9.4 9.7 9.0 9.3  PHOS 4.5  --  3.6 6.7*   Liver Function Tests: Recent Labs  Lab 09/20/20 0834 09/24/20 0425 09/26/20 0952  ALBUMIN 1.6* 1.5* 1.7*   No results for input(s): LIPASE, AMYLASE in the last 168 hours. No results for input(s): AMMONIA in the last 168 hours. CBC: Recent Labs  Lab 09/23/20 0755 09/23/20 1542 09/24/20 0425 09/25/20 0347  WBC 22.7* 22.5* 25.4* 19.6*  HGB 8.3* 10.2* 7.8* 8.0*  HCT 25.2* 31.5* 24.5* 24.6*  MCV 78.8* 78.2* 79.3* 77.1*  PLT 421* 510* 420* 426*   Cardiac Enzymes: No results for input(s): CKTOTAL, CKMB, CKMBINDEX, TROPONINI in the last 168 hours. CBG: Recent Labs  Lab 09/26/20 1105 09/26/20 1602 09/26/20 2001 09/27/20 0031 09/27/20 0448  GLUCAP 104* 137* 130* 130* 137*    Iron Studies: No results for input(s): IRON, TIBC, TRANSFERRIN, FERRITIN in the last 72 hours.  Studies/Results: No results found.  Medications: Infusions:  sodium chloride 10 mL/hr at 10/09/2020 1429  sodium chloride 500 mL (09/01/20 1408)   sodium chloride     sodium chloride     feeding supplement (NEPRO CARB STEADY) 1,000 mL (09/26/20 0935)    Scheduled Medications:  amiodarone  200 mg Per Tube Daily   apixaban  5 mg Per Tube BID   buPROPion  75 mg Per Tube BID   chlorhexidine gluconate (MEDLINE KIT)  15 mL Mouth Rinse BID   Chlorhexidine Gluconate Cloth  6 each Topical Daily   Chlorhexidine Gluconate Cloth  6 each Topical Q0600   darbepoetin (ARANESP) injection - DIALYSIS  100 mcg Intravenous Q Tue-HD   feeding supplement (PROSource TF)  45 mL Per Tube TID   guaiFENesin  5 mL Per  Tube BID   insulin aspart  0-9 Units Subcutaneous Q4H   insulin glargine-yfgn  30 Units Subcutaneous Daily   lidocaine  1 patch Transdermal Q24H   mouth rinse  15 mL Mouth Rinse 10 times per day   metaxalone  800 mg Per Tube TID   midodrine  5 mg Per Tube TID   multivitamin  1 tablet Per Tube QHS   pantoprazole sodium  40 mg Per Tube QHS   sertraline  100 mg Per Tube Daily   simethicone  40 mg Per Tube QID   sodium chloride flush  10-40 mL Intracatheter Q12H    have reviewed scheduled and prn medications.  Physical Exam:  General:NAD, arousable, tracheostomy on vent. Heart:RRR, s1s2 nl Lungs: trach, no increased work of breathing. Abdomen:soft, Non-tender, PEG tube and ostomy bag Extremities:Trace lower extremities edema Dialysis Access: LIJ TDC, LUE AVG T/B+, swelling of the left upper extremity noted (stable)  Jennifier Smitherman 09/27/2020,7:23 AM  LOS: 78 days

## 2020-09-28 ENCOUNTER — Inpatient Hospital Stay (HOSPITAL_COMMUNITY): Payer: 59

## 2020-09-28 DIAGNOSIS — J9611 Chronic respiratory failure with hypoxia: Secondary | ICD-10-CM | POA: Diagnosis not present

## 2020-09-28 DIAGNOSIS — J9601 Acute respiratory failure with hypoxia: Secondary | ICD-10-CM | POA: Diagnosis not present

## 2020-09-28 DIAGNOSIS — R140 Abdominal distension (gaseous): Secondary | ICD-10-CM | POA: Diagnosis not present

## 2020-09-28 DIAGNOSIS — N186 End stage renal disease: Secondary | ICD-10-CM | POA: Diagnosis not present

## 2020-09-28 LAB — CBC
HCT: 28 % — ABNORMAL LOW (ref 39.0–52.0)
Hemoglobin: 9.1 g/dL — ABNORMAL LOW (ref 13.0–17.0)
MCH: 24.9 pg — ABNORMAL LOW (ref 26.0–34.0)
MCHC: 32.5 g/dL (ref 30.0–36.0)
MCV: 76.7 fL — ABNORMAL LOW (ref 80.0–100.0)
Platelets: 459 10*3/uL — ABNORMAL HIGH (ref 150–400)
RBC: 3.65 MIL/uL — ABNORMAL LOW (ref 4.22–5.81)
RDW: 19.5 % — ABNORMAL HIGH (ref 11.5–15.5)
WBC: 21.1 10*3/uL — ABNORMAL HIGH (ref 4.0–10.5)
nRBC: 0.3 % — ABNORMAL HIGH (ref 0.0–0.2)

## 2020-09-28 LAB — MAGNESIUM: Magnesium: 2.1 mg/dL (ref 1.7–2.4)

## 2020-09-28 LAB — BASIC METABOLIC PANEL
Anion gap: 16 — ABNORMAL HIGH (ref 5–15)
Anion gap: 13 (ref 5–15)
BUN: 56 mg/dL — ABNORMAL HIGH (ref 8–23)
BUN: 66 mg/dL — ABNORMAL HIGH (ref 8–23)
CO2: 25 mmol/L (ref 22–32)
CO2: 24 mmol/L (ref 22–32)
Calcium: 8.7 mg/dL — ABNORMAL LOW (ref 8.9–10.3)
Calcium: 8.2 mg/dL — ABNORMAL LOW (ref 8.9–10.3)
Chloride: 89 mmol/L — ABNORMAL LOW (ref 98–111)
Chloride: 93 mmol/L — ABNORMAL LOW (ref 98–111)
Creatinine, Ser: 0.98 mg/dL (ref 0.61–1.24)
Creatinine, Ser: 1.05 mg/dL (ref 0.61–1.24)
GFR, Estimated: >60 mL/min (ref >60)
GFR, Estimated: >60 mL/min (ref >60)
Glucose, Bld: 214 mg/dL — ABNORMAL HIGH (ref 70–99)
Glucose, Bld: 80 mg/dL (ref 70–99)
Potassium: 2.7 mmol/L — CL (ref 3.5–5.1)
Potassium: 4.9 mmol/L (ref 3.5–5.1)
Sodium: 130 mmol/L — ABNORMAL LOW (ref 135–145)
Sodium: 130 mmol/L — ABNORMAL LOW (ref 135–145)

## 2020-09-28 LAB — GLUCOSE, CAPILLARY
Glucose-Capillary: 128 mg/dL — ABNORMAL HIGH (ref 70–99)
Glucose-Capillary: 153 mg/dL — ABNORMAL HIGH (ref 70–99)
Glucose-Capillary: 168 mg/dL — ABNORMAL HIGH (ref 70–99)
Glucose-Capillary: 193 mg/dL — ABNORMAL HIGH (ref 70–99)
Glucose-Capillary: 197 mg/dL — ABNORMAL HIGH (ref 70–99)
Glucose-Capillary: 200 mg/dL — ABNORMAL HIGH (ref 70–99)
Glucose-Capillary: 72 mg/dL (ref 70–99)
Glucose-Capillary: 75 mg/dL (ref 70–99)
Glucose-Capillary: 99 mg/dL (ref 70–99)

## 2020-09-28 LAB — LACTOFERRIN, FECAL, QUALITATIVE: Lactoferrin, Fecal, Qual: NEGATIVE

## 2020-09-28 MED ORDER — POTASSIUM CHLORIDE 20 MEQ PO PACK
40.0000 meq | PACK | Freq: Once | ORAL | Status: AC
  Administered 2020-09-28: 40 meq via ORAL
  Filled 2020-09-28: qty 2

## 2020-09-28 MED ORDER — POTASSIUM CHLORIDE 20 MEQ PO PACK
40.0000 meq | PACK | ORAL | Status: DC
Start: 2020-09-28 — End: 2020-09-28

## 2020-09-28 MED ORDER — DIATRIZOATE MEGLUMINE & SODIUM 66-10 % PO SOLN
ORAL | Status: AC
  Administered 2020-09-28: 30 mL via GASTROSTOMY
  Filled 2020-09-28: qty 30

## 2020-09-28 MED ORDER — POTASSIUM CHLORIDE 10 MEQ/100ML IV SOLN
10.0000 meq | INTRAVENOUS | Status: AC
Start: 2020-09-28 — End: 2020-09-28
  Administered 2020-09-28 (×4): 10 meq via INTRAVENOUS
  Filled 2020-09-28 (×4): qty 100

## 2020-09-28 NOTE — Progress Notes (Signed)
[facility]PH[facility] PROGRESS NOTE  Assessment/ Plan:  # Acute kidney Injury-prolonged dialysis dependency, now progressed to ESRD:  ATN from septic shock-without any evidence of renal recovery to date . initiated on CRRT 07/18/20 and transitioned to IHD.  Has TDC, continue with HD on TTS schedule for now.  Tolerating dialysis well but unable to sit on chair or recliner. -hd tomorrow-will use higher K bath  #Hypokalemia -agree with repletion, will target a higher K bath tomorrow  # Vascular access - had left forearm AVG placed 08/14/2020 but unfortunately had clotted.  s/p thrombectomy and revision 9/2, graft can be used 4 weeks thereafter.  Swelling of left upper extremity noted continue arm elevation and supportive care. Fortunately there is still a b/t today  # Septic shock/ventilator associated pneumonia due to recurrent Acinetobacter infection: Off antibiotics and stable hemodynamics.  #Leukocytosis -persistent, will defer to primary service for further mgmt  #  Chronic respiratory failure: Status post tracheostomy with ventilator management per CCM.  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  # Chronic kidney disease/metabolic bone disease: Calcium and phosphorus level under control with hemodialysis, off binders  # Anemia: contiue ESA, monitor Hb.  Transfuse as needed.  # Acute metabolic encephalopathy: Continue management as above.,  Supportive care.  # Disposition - pt cannot return to [facility] now that he is ESRD. He is going to [facility] in [place], [place]. He needs to have [place] Medicaid & his AVG needs to be mature prior to being accepted. Other possible barrier is pt is a permanent trach & vent pt.     Subjective: Seen and examined in ICU. No acute events. Tolerated hd yesterday, net uf 1.7L. Repleting K (2.7 today) Objective Vital signs in last 24 hours: Vitals:   09/28/20 0600 09/28/20 0700 09/28/20 0716 09/28/20  0722  BP: (!) 164/88 (!) 96/52    Pulse: (!) 105 77    Resp: _0 Temp:   97.9 F (36.6 C)   TempSrc:   Oral   SpO2: 98% 98%  100%  Weight:      Height:       Weight change: 1.8 kg  Intake/Output Summary (Last 24 hours) at 09/28/2020 0914 Last data filed at 09/28/2020 0100 Gross per 24 hour  Intake 1165 ml  Output 1730 ml  Net -565 ml       Labs: Basic Metabolic Panel: Recent Labs  Lab 09/24/20 0425 09/26/20 0952 09/28/20 0549  NA 130* 127* 130*  K 3.9 4.2 2.7*  CL 95* 88* 89*  CO2 23 20* 25  GLUCOSE 165* 122* 214*  BUN 59* 119* 56*  CREATININE 1.11 1.66* 0.98  CALCIUM 9.0 9.3 8.7*  PHOS 3.6 6.7*  --    Liver Function Tests: Recent Labs  Lab 09/24/20 0425 09/26/20 0952  ALBUMIN 1.5* 1.7*   No results for input(s): LIPASE, AMYLASE in the last 168 hours. No results for input(s): AMMONIA in the last 168 hours. CBC: Recent Labs  Lab 09/23/20 0755 09/23/20 1542 09/24/20 0425 09/25/20 0347 09/28/20 0549  WBC 22.7* 22.5* 25.4* 19.6* 21.1*  HGB 8.3* 10.2* 7.8* 8.0* 9.1*  HCT 25.2* 31.5* 24.5* 24.6* 28.0*  MCV 78.8* 78.2* 79.3* 77.1* 76.7*  PLT 421* 510* 420* 426* 459*   Cardiac Enzymes: No results for input(s): CKTOTAL, CKMB, CKMBINDEX, TROPONINI in the last 168 hours. CBG: Recent Labs  Lab 09/28/20 0006 09/28/20 0130 09/28/20 0334 09/28/20 0542 09/28/20 0714  GLUCAP 153* 200*  197* 193* 168*    Iron Studies: No results for input(s): IRON, TIBC, TRANSFERRIN, FERRITIN in the last 72 hours.  Studies/Results: No results found.  Medications: Infusions:  sodium chloride 10 mL/hr at 09/28/2020 1429   sodium chloride 500 mL (09/01/20 1408)   sodium chloride     sodium chloride     feeding supplement (NEPRO CARB STEADY) 1,000 mL (09/27/20 1355)   potassium chloride 10 mEq (09/28/20 0859)    Scheduled Medications:  amiodarone  200 mg Per Tube Daily   apixaban  5 mg Per Tube BID   buPROPion  75 mg Per Tube BID   chlorhexidine gluconate  (MEDLINE KIT)  15 mL Mouth Rinse BID   Chlorhexidine Gluconate Cloth  6 each Topical Daily   Chlorhexidine Gluconate Cloth  6 each Topical Q0600   darbepoetin (ARANESP) injection - DIALYSIS  100 mcg Intravenous Q Tue-HD   feeding supplement (PROSource TF)  45 mL Per Tube TID   guaiFENesin  5 mL Per Tube BID   insulin aspart  0-9 Units Subcutaneous Q4H   insulin glargine-yfgn  30 Units Subcutaneous Daily   lidocaine  1 patch Transdermal Q24H   mouth rinse  15 mL Mouth Rinse 10 times per day   metaxalone  800 mg Per Tube TID   midodrine  5 mg Per Tube TID   multivitamin  1 tablet Per Tube QHS   pantoprazole sodium  40 mg Per Tube Daily   sertraline  100 mg Per Tube Daily   simethicone  40 mg Per Tube QID   sodium chloride flush  10-40 mL Intracatheter Q12H    have reviewed scheduled and prn medications.  Physical Exam:  General:NAD, arousable, tracheostomy on vent. Heart:RRR, s1s2 nl Lungs: trach, no increased work of breathing. Abdomen:soft, Non-tender, PEG tube and ostomy bag Extremities:Trace lower extremities edema Dialysis Access: LIJ TDC, LUE AVG faint T/B+, swelling of the left upper extremity noted (stable)  Delno Blaisdell 09/28/2020,9:14 AM  LOS: 79 days

## 2020-09-28 NOTE — Plan of Care (Signed)

## 2020-09-28 NOTE — Progress Notes (Signed)
PROGRESS NOTE    Luis Orr   QBH:[id number]  DOB: Mar 16, 1950  DOA: 06/14/2020 PCP: Townsend Roger, MD   Brief Narrative:  Luis Orr is a 70 year old male with C3-C5 compressive myelopathy and subsequent quadriplegia who is chronically on a trach and vent.  He presented from [place] facility with altered mental status hypotension and pneumonia with resultant septic shock and was admitted to the ICU.  He is noted to have multiple admissions for infections including bacteremia, UTI and pneumonia and has grown out multidrug-resistant Klebsiella, MRSA and Pseudomonas.  His most recent admission was in 9/21 due to multidrug-resistant Klebsiella, right ureteral calculus requiring a double-J stent and was treated with meropenem.  He was discharged back to [facility].  The patient was admitted to the ICU on 6/29 and treated for septic shock. 7/2 ID Consult for MDR acinetobacter in trach asp, Meropenem changed to unasyn 7/2 PRBC transfusion, iron replacement 7/5 Pressors added again. Central line placed. Drainage from gastrostomy tube >> CT Abd without fluid collection around gastrostomy tube but with diffuse body wall edema. Echo EF 50-55%, mild LVH 7/6 Renal Replacement Therapy ; Code status changed to partial Code (DNR) 7/7 Transfuse 1U PRBC 7/9 add solu cortef 7/10 off pressors, weaning stress steroids 7/11 Pressors added back on again 7/12 ID consult due to rising WBC, hypothermia, hypotension concern for worsening infection. Vancomycin added. Repeat blood cx collected. 7/15 CRRT stopped 7/17 Restarted on low dose levo. No evidence of new infection. Held off on antibiotics. Nephrology contacted family and decision was made to continue iHD this week to allow additional time for renal recovery. Patient is not the best long term iHD candidate but family wants aggressive care 7/18 plans for iHD, tolerated with 2L removed 719 slight drop in hgb to 6.9 will transfuse 1 unit  PRBC 7/22 > 7/26: Tolerating HD. TOC consulted for placement. Will likely need out of state placement. 8/15 WBCs greater than 28,000, hypoglycemia, procalcitonin >18, chest x-ray consistent with pneumonia, recent blood cultures consistent with contamination.  Unasyn IV resumed after sputum culture obtained 8/16 consultation placed with ethics committee regarding evaluation for futility of care 8/16 Sputum cx + for Acinetobacter with sensitivities pending 8/17 extensive conversation held with patient's wife regarding patient's poor prognosis and reemergence of Acinetobacter VAP.  Also discussed with her that patient has been able to convey to multiple staff that he does not want to continue on the ventilator or with dialysis.  Wife reports that the patient does not understand what he is saying, she is the POA and that she wants to continue aggressive care. 8/18 patient now telling nephrology, myself and the rest of the staff he replies yes to wanting to remain on dialysis and on the ventilator and this direct question is asked. 8/18 Acinetobacter sensitive to Unasyn only.  Discussed with ID/Dr. Juleen China.  Plan is to administer antibiotics for 7-day since patient has not had any increased O2 needs and remains afebrile.  If any concerns over worsening symptoms can give an additional 3 days. Developed moderate bleeding around tracheostomy site on 8/19.  Likely related to combination of Eliquis and heparin required for CRRT.  We will hold Eliquis for now and monitor degree of bleeding Eliquis resumed on 8/22.  Overnight into 8/23 patient bit his tongue and had bleeding.  Also had some bleeding around trach likely from oral bleeding.  Also though did have bleeding from old central line site. 8/23 had transient bleeding around trach and prior  central line insertion site overnight.  Patient had bitten tongue and this likely explain the bleeding.  This morning patient was not having any bleeding.  On 8/25 had  redeveloped slow ooze bleeding from mouth and trach.  Eliquis discontinued in favor of IV heparin especially given patient will undergo surgical procedure for vascular access on 8/29 8/30 Left FA AVG occluded 9/2 status post thrombectomy and revision of LUE AV graft 9/4 LUE edema with negative venous duplex. 9/5 heparin discontinued and Eliquis resumed     Subjective:  No complaints-      Assessment & Plan:   Principal Problem:   Acute respiratory failure with hypoxia superimposed on chronic respiratory failure Recurrent ventilator associated pneumonias Septic shock -Developed Acinetobacter pneumonia on 8/14 and completed 7 days of Unasyn -Remains on the ventilator and is stable at this time -Appreciate PCCM managing ventilator  Active Problems:  PEG tube complication - PEG came out while RNs were doing wound care - I have contacted IR & spoken with Dr Kathlene Cote who plans to replace the PEG today  Hypokalemia - K 2.7 today - Mg normal - I have been asking RNs to document stool output and he was noted to have 810 cc of stool in colostomy over a 24 hr period on 9/15 - likely the source of his hypokalemia  Diarrhea - will send stool studies today- he has abdominal tenderness and distension on exam - WBC count continue to be elevated - follow  AKI with oliguria Now end-stage renal disease - initially suspected to be ATN related to septic shock -Unfortunately, renal recovery has not occurred -Imaging revealed marked atrophy of the right kidney- Double-J ureteral stent is still present - Appreciate nephrology management of this-she continues to require dialysis via HD catheter on Tuesday Thursday Saturdays - Has left arm AV graft that was placed on 8/29 however, this has clotted-he had a thrombectomy and revision on 9/2 - Graft will need to mature for 4 weeks after revision before it can be utilized - Midodrine being used for hypotension and tachycardia  Large stage 4 sacral  decubitus ulcer eroding into the sacrum- (POA) - stage 2 pressure ulcer right heel - CT abdomen and pelvis performed on the 12th for further work-up of leukocytosis reveals that the sacral decubitus ulcer is eroding into the distal sacrum with gas tracking into the right gluteus maximus muscle-no fluid noted - 9/15> blood noted on dressing - there was no pus, but foul odor noted- I asked General surgery for an opinion on wound care on 9/15- they debrided the wound at bedside- currently no further bleeding note  Persistently elevated WBC count - WBC count has been continued continuously elevated but fluctuates and has been as high as the 30s - no fevers noted- no other signs of sepsis (tachycardia/ tachypnea)  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 -Loose stool noted in colostomy today-abdomen is slightly distended -Of note, recent CT scan showed > nodular irregular thickening of the ascending colon, stable since immediate prior examinationsGI was consulted-at this point, GI does not recommend a colonoscopy   - dc'd antibiotics -9/14-  following  - high risk for C diff with recurrent antibiotics  Insulin-dependent diabetes mellitus -Semglee and ISS being used Hemoglobin A1C    Component Value Date/Time   HGBA1C 6.3 (H) 07/02/2020 2143   Microcytic anemia - ferritin 282- likely elevated (acute phase reactant) in setting of multiple infections and acute stress - iron level 27 and saturation 12  Quadriplegia secondary to  C3-C5 compressive myelopathy PEG tube- severe protein calorie malnutrition -Continue tube feeds and supportive care -Receiving Skelaxin  Left upper arm edema - Related to above-mentioned clotted graft which is status post revision  A-fib, paroxysmal - cont Amiodarone  Time spent in minutes: 30 DVT prophylaxis: Place and maintain sequential compression device Start: 07/31/20 1614 apixaban (ELIQUIS) tablet 5 mg  Code Status: Partial- No CPR- give ACLS  meds Family Communication:  Level of Care: Level of care: ICU Disposition Plan:  Status is: Inpatient  Remains inpatient appropriate because:Unsafe d/c plan  Dispo: The patient is from:  [facility]              Anticipated d/c is to:  TBD              Patient currently is not medically stable to d/c.   Difficult to place patient No  Consultants:  Pulmonary critical care Nephrology Infectious disease Palliative medicine  Procedures:  HD catheter AV fistula with declotting and revision   Objective: Vitals:   09/28/20 0334 09/28/20 0500 09/28/20 0716 09/28/20 0722  BP:      Pulse:      Resp:    20  Temp: 98.7 F (37.1 C)  97.9 F (36.6 C)   TempSrc: Axillary  Oral   SpO2:    100%  Weight:  81.2 kg    Height:        Intake/Output Summary (Last 24 hours) at 09/28/2020 0727 Last data filed at 09/28/2020 0100 Gross per 24 hour  Intake 1255 ml  Output 1730 ml  Net -475 ml    Filed Weights   09/27/20 1945 09/27/20 2345 09/28/20 0500  Weight: 84.2 kg 82.5 kg 81.2 kg    Examination: General exam: Appears comfortable  HEENT: PERRLA, oral mucosa moist, no sclera icterus or thrush Respiratory system: Clear to auscultation. Respiratory effort normal. Cardiovascular system: S1 & S2 heard, regular rate and rhythm Gastrointestinal system: Abdomen soft, non-tender, nondistended. Normal bowel sounds - PEG tube opening has tube feed coming out  Central nervous system: Alert - quadriplegic Extremities: No cyanosis, clubbing- edema of left arm Skin: No rashes or ulcers Psychiatry:  Mood & affect appropriate.   Skin: tunneling sacral ulcer, stage 2 on right heal          Data Reviewed: I have personally reviewed following labs and imaging studies  CBC: Recent Labs  Lab 09/23/20 0755 09/23/20 1542 09/24/20 0425 09/25/20 0347 09/28/20 0549  WBC 22.7* 22.5* 25.4* 19.6* 21.1*  HGB 8.3* 10.2* 7.8* 8.0* 9.1*  HCT 25.2* 31.5* 24.5* 24.6* 28.0*  MCV 78.8* 78.2*  79.3* 77.1* 76.7*  PLT 421* 510* 420* 426* 459*    Basic Metabolic Panel: Recent Labs  Lab 09/23/20 0755 09/24/20 0425 09/26/20 0952 09/28/20 0549  NA 129* 130* 127* 130*  K 4.3 3.9 4.2 2.7*  CL 92* 95* 88* 89*  CO2 23 23 20* 25  GLUCOSE 146* 165* 122* 214*  BUN 101* 59* 119* 56*  CREATININE 1.56* 1.11 1.66* 0.98  CALCIUM 9.7 9.0 9.3 8.7*  PHOS  --  3.6 6.7*  --     GFR: Estimated Creatinine Clearance: 70.2 mL/min (by C-G formula based on SCr of 0.98 mg/dL). Liver Function Tests: Recent Labs  Lab 09/24/20 0425 09/26/20 0952  ALBUMIN 1.5* 1.7*    No results for input(s): LIPASE, AMYLASE in the last 168 hours. No results for input(s): AMMONIA in the last 168 hours. Coagulation Profile: No results for input(s): INR, PROTIME  in the last 168 hours. Cardiac Enzymes: No results for input(s): CKTOTAL, CKMB, CKMBINDEX, TROPONINI in the last 168 hours. BNP (last 3 results) No results for input(s): PROBNP in the last 8760 hours. HbA1C: No results for input(s): HGBA1C in the last 72 hours. CBG: Recent Labs  Lab 09/28/20 0006 09/28/20 0130 09/28/20 0334 09/28/20 0542 09/28/20 0714  GLUCAP 153* 200* 197* 193* 168*    Lipid Profile: No results for input(s): CHOL, HDL, LDLCALC, TRIG, CHOLHDL, LDLDIRECT in the last 72 hours. Thyroid Function Tests: No results for input(s): TSH, T4TOTAL, FREET4, T3FREE, THYROIDAB in the last 72 hours. Anemia Panel: No results for input(s): VITAMINB12, FOLATE, FERRITIN, TIBC, IRON, RETICCTPCT in the last 72 hours. Urine analysis:    Component Value Date/Time   COLORURINE YELLOW 07/25/2020 0334   APPEARANCEUR CLEAR 07/25/2020 0334   LABSPEC 1.020 07/25/2020 0334   PHURINE 6.5 07/25/2020 0334   GLUCOSEU 100 (A) 07/25/2020 0334   HGBUR LARGE (A) 07/25/2020 0334   BILIRUBINUR SMALL (A) 07/25/2020 0334   KETONESUR NEGATIVE 07/25/2020 0334   PROTEINUR >300 (A) 07/25/2020 0334   NITRITE NEGATIVE 07/25/2020 0334   LEUKOCYTESUR MODERATE  (A) 07/25/2020 0334   Sepsis Labs: @LABRCNTIP (procalcitonin:4,lacticidven:4) )No results found for this or any previous visit (from the past 240 hour(s)).       Radiology Studies: No results found.    Scheduled Meds:  amiodarone  200 mg Per Tube Daily   apixaban  5 mg Per Tube BID   buPROPion  75 mg Per Tube BID   chlorhexidine gluconate (MEDLINE KIT)  15 mL Mouth Rinse BID   Chlorhexidine Gluconate Cloth  6 each Topical Daily   Chlorhexidine Gluconate Cloth  6 each Topical Q0600   darbepoetin (ARANESP) injection - DIALYSIS  100 mcg Intravenous Q Tue-HD   feeding supplement (PROSource TF)  45 mL Per Tube TID   guaiFENesin  5 mL Per Tube BID   insulin aspart  0-9 Units Subcutaneous Q4H   insulin glargine-yfgn  30 Units Subcutaneous Daily   lidocaine  1 patch Transdermal Q24H   mouth rinse  15 mL Mouth Rinse 10 times per day   metaxalone  800 mg Per Tube TID   midodrine  5 mg Per Tube TID   multivitamin  1 tablet Per Tube QHS   pantoprazole sodium  40 mg Per Tube Daily   potassium chloride  40 mEq Oral Q4H   sertraline  100 mg Per Tube Daily   simethicone  40 mg Per Tube QID   sodium chloride flush  10-40 mL Intracatheter Q12H   Continuous Infusions:  sodium chloride 10 mL/hr at 09/16/2020 1429   sodium chloride 500 mL (09/01/20 1408)   sodium chloride     sodium chloride     feeding supplement (NEPRO CARB STEADY) 1,000 mL (09/27/20 1355)     LOS: 79 days      Debbe Odea, MD [facility] Pager: [email or web address] 09/28/2020, 7:27 AM

## 2020-09-28 NOTE — Progress Notes (Signed)
RN [place] accepted patient from RN [facility] at 1200.

## 2020-09-28 NOTE — TOC Progression Note (Signed)
Transition of Care [facility]) - Progression Note    Patient Details  Name: Luis Orr MRN: [id number] Date of Birth: [birth year]  Transition of Care [facility]) CM/SW [place], RN Phone Number: 09/28/2020, 2:02 PM  Clinical Narrative:    Case management spoke with June Sica's wife on the phone and assisted her with having documents mailed to Erin Hearing, NP to sign regarding patient's social security benefits.  The social security office was given the mailing address for [facility] to Allison's attention so that she could complete and sign and have forwarded to [facility] in [place].  CM and MSW on DTP Team will continue to follow the patient for discharge planning.   Expected Discharge Plan: [place] Barriers to Discharge: Continued Medical Work up, No SNF bed, Vent Bed not available  Expected Discharge Plan and Services Expected Discharge Plan: [place]   Discharge Planning Services: CM Consult Post Acute Care Choice: [place] Living arrangements for the past 2 months: Post-Acute Facility                                       Social Determinants of Health (SDOH) Interventions    Readmission Risk Interventions No flowsheet data found.

## 2020-09-28 NOTE — Procedures (Signed)
Patient seen at bedside for g-tube site replacement procedure. Balloon retention of the xisting foley catheter placeholder  was deflated and the foley cathter was removed using gentle traction.   New tube inserted with out difficulty 9 ml of normal saline instilled in the retention balloon port and the flange repositioned for comfort. Stomach contents noted in the g tube.   **Confirmation abdominal xray confirming placement pending. Once confirmed  Ok to begin using g-tube for medicines, tube feeds, free water, etc.  Please call IR for any questions or concerns regarding the g-tube.

## 2020-09-29 DIAGNOSIS — N186 End stage renal disease: Secondary | ICD-10-CM | POA: Diagnosis not present

## 2020-09-29 DIAGNOSIS — R140 Abdominal distension (gaseous): Secondary | ICD-10-CM | POA: Diagnosis not present

## 2020-09-29 DIAGNOSIS — J9601 Acute respiratory failure with hypoxia: Secondary | ICD-10-CM | POA: Diagnosis not present

## 2020-09-29 DIAGNOSIS — J9611 Chronic respiratory failure with hypoxia: Secondary | ICD-10-CM | POA: Diagnosis not present

## 2020-09-29 LAB — GLUCOSE, CAPILLARY
Glucose-Capillary: 125 mg/dL — ABNORMAL HIGH (ref 70–99)
Glucose-Capillary: 118 mg/dL — ABNORMAL HIGH (ref 70–99)
Glucose-Capillary: 139 mg/dL — ABNORMAL HIGH (ref 70–99)
Glucose-Capillary: 135 mg/dL — ABNORMAL HIGH (ref 70–99)
Glucose-Capillary: 129 mg/dL — ABNORMAL HIGH (ref 70–99)

## 2020-09-29 LAB — CBC
HCT: 26.9 % — ABNORMAL LOW (ref 39.0–52.0)
Hemoglobin: 8.8 g/dL — ABNORMAL LOW (ref 13.0–17.0)
MCH: 25 pg — ABNORMAL LOW (ref 26.0–34.0)
MCHC: 32.7 g/dL (ref 30.0–36.0)
MCV: 76.4 fL — ABNORMAL LOW (ref 80.0–100.0)
Platelets: 463 10*3/uL — ABNORMAL HIGH (ref 150–400)
RBC: 3.52 MIL/uL — ABNORMAL LOW (ref 4.22–5.81)
RDW: 19.9 % — ABNORMAL HIGH (ref 11.5–15.5)
WBC: 19.7 10*3/uL — ABNORMAL HIGH (ref 4.0–10.5)
nRBC: 0.5 % — ABNORMAL HIGH (ref 0.0–0.2)

## 2020-09-29 LAB — BASIC METABOLIC PANEL
Anion gap: 14 (ref 5–15)
BUN: 78 mg/dL — ABNORMAL HIGH (ref 8–23)
CO2: 24 mmol/L (ref 22–32)
Calcium: 8.8 mg/dL — ABNORMAL LOW (ref 8.9–10.3)
Chloride: 93 mmol/L — ABNORMAL LOW (ref 98–111)
Creatinine, Ser: 1.24 mg/dL (ref 0.61–1.24)
GFR, Estimated: >60 mL/min (ref >60)
Glucose, Bld: 125 mg/dL — ABNORMAL HIGH (ref 70–99)
Potassium: 4.9 mmol/L (ref 3.5–5.1)
Sodium: 131 mmol/L — ABNORMAL LOW (ref 135–145)

## 2020-09-29 MED ORDER — SODIUM CHLORIDE 0.9 % IV BOLUS
250.0000 mL | Freq: Once | INTRAVENOUS | Status: AC
  Administered 2020-09-29: 250 mL via INTRAVENOUS

## 2020-09-29 NOTE — Progress Notes (Signed)
HD treatment time modified to 3 hours per Dr. Jonnie Finner.

## 2020-09-29 NOTE — Progress Notes (Addendum)
PROGRESS NOTE    Luis Orr   [id number]  DOB: May 10, 1950  DOA: 07/09/2020 PCP: Townsend Roger, MD   Brief Narrative:  Luis Orr is a 70 year old male with C3-C5 compressive myelopathy and subsequent quadriplegia who is chronically on a trach and vent.  He presented from [facility] facility with altered mental status hypotension and pneumonia with resultant septic shock and was admitted to the ICU.  He is noted to have multiple admissions for infections including bacteremia, UTI and pneumonia and has grown out multidrug-resistant Klebsiella, MRSA and Pseudomonas.  His most recent admission was in 9/21 due to multidrug-resistant Klebsiella, right ureteral calculus requiring a double-J stent and was treated with meropenem.  He was discharged back to [facility].  The patient was admitted to the ICU on 6/29 and treated for septic shock. 7/2 ID Consult for MDR acinetobacter in trach asp, Meropenem changed to unasyn 7/2 PRBC transfusion, iron replacement 7/5 Pressors added again. Central line placed. Drainage from gastrostomy tube >> CT Abd without fluid collection around gastrostomy tube but with diffuse body wall edema. Echo EF 50-55%, mild LVH 7/6 Renal Replacement Therapy ; Code status changed to partial Code (DNR) 7/7 Transfuse 1U PRBC 7/9 add solu cortef 7/10 off pressors, weaning stress steroids 7/11 Pressors added back on again 7/12 ID consult due to rising WBC, hypothermia, hypotension concern for worsening infection. Vancomycin added. Repeat blood cx collected. 7/15 CRRT stopped 7/17 Restarted on low dose levo. No evidence of new infection. Held off on antibiotics. Nephrology contacted family and decision was made to continue iHD this week to allow additional time for renal recovery. Patient is not the best long term iHD candidate but family wants aggressive care 7/18 plans for iHD, tolerated with 2L removed 719 slight drop in hgb to 6.9 will transfuse 1 unit  PRBC 7/22 > 7/26: Tolerating HD. TOC consulted for placement. Will likely need out of state placement. 8/15 WBCs greater than 28,000, hypoglycemia, procalcitonin >18, chest x-ray consistent with pneumonia, recent blood cultures consistent with contamination.  Unasyn IV resumed after sputum culture obtained 8/16 consultation placed with ethics committee regarding evaluation for futility of care 8/16 Sputum cx + for Acinetobacter with sensitivities pending 8/17 extensive conversation held with patient's wife regarding patient's poor prognosis and reemergence of Acinetobacter VAP.  Also discussed with her that patient has been able to convey to multiple staff that he does not want to continue on the ventilator or with dialysis.  Wife reports that the patient does not understand what he is saying, she is the POA and that she wants to continue aggressive care. 8/18 patient now telling nephrology, myself and the rest of the staff he replies yes to wanting to remain on dialysis and on the ventilator and this direct question is asked. 8/18 Acinetobacter sensitive to Unasyn only.  Discussed with ID/Dr. Juleen China.  Plan is to administer antibiotics for 7-day since patient has not had any increased O2 needs and remains afebrile.  If any concerns over worsening symptoms can give an additional 3 days. Developed moderate bleeding around tracheostomy site on 8/19.  Likely related to combination of Eliquis and heparin required for CRRT.  We will hold Eliquis for now and monitor degree of bleeding Eliquis resumed on 8/22.  Overnight into 8/23 patient bit his tongue and had bleeding.  Also had some bleeding around trach likely from oral bleeding.  Also though did have bleeding from old central line site. 8/23 had transient bleeding around trach and prior  central line insertion site overnight.  Patient had bitten tongue and this likely explain the bleeding.  This morning patient was not having any bleeding.  On 8/25 had  redeveloped slow ooze bleeding from mouth and trach.  Eliquis discontinued in favor of IV heparin especially given patient will undergo surgical procedure for vascular access on 8/29 8/30 Left FA AVG occluded 9/2 status post thrombectomy and revision of LUE AV graft 9/4 LUE edema with negative venous duplex. 9/5 heparin discontinued and Eliquis resumed     Subjective: No complaints. Appears comfortable    Assessment & Plan:   Principal Problem:   Acute respiratory failure with hypoxia superimposed on chronic respiratory failure Recurrent ventilator associated pneumonias Septic shock -Developed Acinetobacter pneumonia on 8/14 and completed 7 days of Unasyn -Remains on the ventilator and is stable at this time -Appreciate PCCM managing ventilator  Active Problems:  PEG tube complication - 9/51> PEG came out while RNs were doing wound care - I contacted IR, spoke with Dr Kathlene Cote  and PEG replaced mid day - cont tube feeds meds via PEG  Hypokalemia - K 2.7 on 9/16- replaced - Mg normal   Diarrhea - 9/16> > 800 cc of stool in 24 hr period per documentation- sent stool studies which are pending - WBC count continue to be elevated through out hospital stay - follow  AKI with oliguria Now end-stage renal disease - initially suspected to be ATN related to septic shock -Unfortunately, renal recovery has not occurred -Imaging revealed marked atrophy of the right kidney- Double-J ureteral stent is still present - Appreciate nephrology management of this-she continues to require dialysis via HD catheter on Tuesday Thursday Saturdays - Has left arm AV graft that was placed on 8/29 however, this has clotted-he had a thrombectomy and revision on 9/2 - Graft will need to mature for 4 weeks after revision before it can be utilized - Midodrine being used for hypotension and tachycardia  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 into the sacrum- (POA) - stage 2 pressure ulcer right  heel - CT abdomen and pelvis performed on the 12th for further work-up of leukocytosis reveals that the sacral decubitus ulcer is eroding into the distal sacrum with gas tracking into the right gluteus maximus muscle-no fluid noted - 9/15> blood noted on dressing - there was no pus, but foul odor noted- I asked General surgery for an opinion on wound care on 9/15- they debrided the wound at bedside-  no further bleeding noted  Persistently elevated WBC count - WBC count has been continued continuously elevated but fluctuates and has been as high as the 30s - no fevers noted- no other signs of sepsis (tachycardia/ tachypnea)  Hartmann pouch with colostomy -Loose stool noted in colostomy today-abdomen is slightly distended -Of note, recent CT scan showed > nodular irregular thickening of the ascending colon, stable since immediate prior examinationsGI was consulted-at this point, GI does not recommend a colonoscopy   - dc'd antibiotics -9/14-  following  - high risk for C diff with recurrent antibiotics  Insulin-dependent diabetes mellitus -Semglee and ISS being used Hemoglobin A1C    Component Value Date/Time   HGBA1C 6.3 (H) 07/03/2020 2143   Microcytic anemia - ferritin 282- likely elevated (acute phase reactant) in setting of multiple infections and acute stress - iron level 27 and saturation 12  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 PEG tube- severe protein calorie malnutrition -Continue tube feeds and supportive care -Receiving Skelaxin  Left upper arm  edema - Related to above-mentioned clotted graft which is status post revision  A-fib, paroxysmal - cont Amiodarone and Eliquis  Time spent in minutes:  30 DVT prophylaxis: Place and maintain sequential compression device Start: 07/31/20 1614 apixaban (ELIQUIS) tablet 5 mg  Code Status: Partial- No CPR- give ACLS meds Family Communication:  Level of Care: Level of care: ICU Disposition Plan:  Status is:  Inpatient  Remains inpatient appropriate because:Unsafe d/c plan  Dispo: The patient is from:  [facility]              Anticipated d/c is to:  TBD              Patient currently is not medically stable to d/c.   Difficult to place patient No  Consultants:  Pulmonary critical care Nephrology Infectious disease Palliative medicine  Procedures:  HD catheter AV fistula with declotting and revision   Objective: Vitals:   09/29/20 0600 09/29/20 0700 09/29/20 0755 09/29/20 0845  BP: (!) 161/88 (!) 153/85    Pulse: (!) 102 97    Resp: 20 20    Temp:   98.3 F (36.8 C)   TempSrc:   Oral   SpO2: 97% 100%  100%  Weight:      Height:        Intake/Output Summary (Last 24 hours) at 09/29/2020 1101 Last data filed at 09/29/2020 0914 Gross per 24 hour  Intake 1039.93 ml  Output 425 ml  Net 614.93 ml    Filed Weights   09/27/20 1945 09/27/20 2345 09/28/20 0500  Weight: 84.2 kg 82.5 kg 81.2 kg    Examination: General exam: Appears comfortable  HEENT: PERRLA, oral mucosa moist, no sclera icterus or thrush Respiratory system: Clear to auscultation. Respiratory effort normal. Trach present/ on ventilator Cardiovascular system: S1 & S2 heard, regular rate and rhythm Gastrointestinal system: Abdomen soft, non-tender, nondistended. Normal bowel sounds- PEG tube present- colostomy has liquid stool.    Central nervous system: Alert on ventilator- quadriplegic Extremities: No cyanosis, clubbing left arm edema unchanged Skin: No rashes or ulcers Psychiatry:  Mood & affect appropriate.            Data Reviewed: I have personally reviewed following labs and imaging studies  CBC: Recent Labs  Lab 09/23/20 1542 09/24/20 0425 09/25/20 0347 09/28/20 0549 09/29/20 0406  WBC 22.5* 25.4* 19.6* 21.1* 19.7*  HGB 10.2* 7.8* 8.0* 9.1* 8.8*  HCT 31.5* 24.5* 24.6* 28.0* 26.9*  MCV 78.2* 79.3* 77.1* 76.7* 76.4*  PLT 510* 420* 426* 459* 463*    Basic Metabolic Panel: Recent  Labs  Lab 09/24/20 0425 09/26/20 0952 09/28/20 0549 09/28/20 1315 09/29/20 0406  NA 130* 127* 130* 130* 131*  K 3.9 4.2 2.7* 4.9 4.9  CL 95* 88* 89* 93* 93*  CO2 23 20* 25 24 24   GLUCOSE 165* 122* 214* 80 125*  BUN 59* 119* 56* 66* 78*  CREATININE 1.11 1.66* 0.98 1.05 1.24  CALCIUM 9.0 9.3 8.7* 8.2* 8.8*  MG  --   --  2.1  --   --   PHOS 3.6 6.7*  --   --   --     GFR: Estimated Creatinine Clearance: 55.5 mL/min (by C-G formula based on SCr of 1.24 mg/dL). Liver Function Tests: Recent Labs  Lab 09/24/20 0425 09/26/20 0952  ALBUMIN 1.5* 1.7*    No results for input(s): LIPASE, AMYLASE in the last 168 hours. No results for input(s): AMMONIA in the last 168 hours. Coagulation Profile: No  results for input(s): INR, PROTIME in the last 168 hours. Cardiac Enzymes: No results for input(s): CKTOTAL, CKMB, CKMBINDEX, TROPONINI in the last 168 hours. BNP (last 3 results) No results for input(s): PROBNP in the last 8760 hours. HbA1C: No results for input(s): HGBA1C in the last 72 hours. CBG: Recent Labs  Lab 09/28/20 1535 09/28/20 1916 09/28/20 2314 09/29/20 0335 09/29/20 0754  GLUCAP 72 75 99 125* 118*    Lipid Profile: No results for input(s): CHOL, HDL, LDLCALC, TRIG, CHOLHDL, LDLDIRECT in the last 72 hours. Thyroid Function Tests: No results for input(s): TSH, T4TOTAL, FREET4, T3FREE, THYROIDAB in the last 72 hours. Anemia Panel: No results for input(s): VITAMINB12, FOLATE, FERRITIN, TIBC, IRON, RETICCTPCT in the last 72 hours. Urine analysis:    Component Value Date/Time   COLORURINE YELLOW 07/25/2020 0334   APPEARANCEUR CLEAR 07/25/2020 0334   LABSPEC 1.020 07/25/2020 0334   PHURINE 6.5 07/25/2020 0334   GLUCOSEU 100 (A) 07/25/2020 0334   HGBUR LARGE (A) 07/25/2020 0334   BILIRUBINUR SMALL (A) 07/25/2020 0334   KETONESUR NEGATIVE 07/25/2020 0334   PROTEINUR >300 (A) 07/25/2020 0334   NITRITE NEGATIVE 07/25/2020 0334   LEUKOCYTESUR MODERATE (A)  07/25/2020 0334   Sepsis Labs: @LABRCNTIP (procalcitonin:4,lacticidven:4) )No results found for this or any previous visit (from the past 240 hour(s)).       Radiology Studies: DG ABDOMEN PEG TUBE LOCATION  Result Date: 09/28/2020 CLINICAL DATA:  G-tube replacement EXAM: ABDOMEN - 1 VIEW COMPARISON:  None. CONTRAST:  30 mL Gastrografin FINDINGS: Injection of contrast through the gastrostomy tube demonstrates intraluminal positioning. IMPRESSION: Intraluminal gastrostomy tube. Electronically Signed   By: Macy Mis M.D.   On: 09/28/2020 15:25      Scheduled Meds:  amiodarone  200 mg Per Tube Daily   apixaban  5 mg Per Tube BID   buPROPion  75 mg Per Tube BID   chlorhexidine gluconate (MEDLINE KIT)  15 mL Mouth Rinse BID   Chlorhexidine Gluconate Cloth  6 each Topical Daily   Chlorhexidine Gluconate Cloth  6 each Topical Q0600   darbepoetin (ARANESP) injection - DIALYSIS  100 mcg Intravenous Q Tue-HD   feeding supplement (PROSource TF)  45 mL Per Tube TID   guaiFENesin  5 mL Per Tube BID   insulin aspart  0-9 Units Subcutaneous Q4H   insulin glargine-yfgn  30 Units Subcutaneous Daily   lidocaine  1 patch Transdermal Q24H   mouth rinse  15 mL Mouth Rinse 10 times per day   metaxalone  800 mg Per Tube TID   midodrine  5 mg Per Tube TID   multivitamin  1 tablet Per Tube QHS   pantoprazole sodium  40 mg Per Tube Daily   sertraline  100 mg Per Tube Daily   simethicone  40 mg Per Tube QID   sodium chloride flush  10-40 mL Intracatheter Q12H   Continuous Infusions:  sodium chloride 10 mL/hr at 09/13/2020 1429   sodium chloride 500 mL (09/01/20 1408)   sodium chloride     sodium chloride     feeding supplement (NEPRO CARB STEADY) 45 mL/hr at 09/28/20 1800     LOS: 80 days      Debbe Odea, MD [facility] Pager: [email or web address] 09/29/2020, 11:01 AM

## 2020-09-29 NOTE — Progress Notes (Signed)
[facility]OLOG[facility] PROGRESS NOTE  Assessment/ Plan:  # Acute kidney Injury-prolonged dialysis dependency, now progressed to ESRD:  ATN from septic shock-without any evidence of renal recovery to date . initiated on CRRT 07/18/20 and transitioned to IHD.  Has TDC, continue with HD on TTS schedule for now.  Tolerating dialysis well but unable to sit on chair or recliner. -hd today  # Vascular access - had left forearm AVG placed 08/13/2020 but unfortunately had clotted.  s/p thrombectomy and revision 9/2, graft can be used 4 weeks thereafter.  Swelling of left upper extremity noted continue arm elevation and supportive care. Fortunately there is still a b/t today--monitoring closely  # Septic shock/ventilator associated pneumonia due to recurrent Acinetobacter infection: Off antibiotics and stable hemodynamics.  #Leukocytosis -persistent, will defer to primary service for further mgmt  #  Chronic respiratory failure: Status post tracheostomy with ventilator management per CCM.  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  # Chronic kidney disease/metabolic bone disease: Calcium and phosphorus level under control with hemodialysis, off binders  # Anemia: contiue ESA, monitor Hb.  Transfuse as needed.  # Acute metabolic encephalopathy: Continue management as above.,  Supportive care.  # Disposition - pt cannot return to [facility] now that he is ESRD. He is going to [facility] in [place], [place]. He needs to have [place] Medicaid & his AVG needs to be mature prior to being accepted. Other possible barrier is pt is a permanent trach & vent pt.     Subjective: Seen and examined in ICU. No acute events.  Objective Vital signs in last 24 hours: Vitals:   09/29/20 0400 09/29/20 0500 09/29/20 0600 09/29/20 0700  BP: (!) 142/82 (!) 146/85 (!) 161/88 (!) 153/85  Pulse: (!) 101 100 (!) 102 97  Resp: 20 20 20 20   Temp:      TempSrc:      SpO2:  97% 98% 97% 100%  Weight:      Height:       Weight change:   Intake/Output Summary (Last 24 hours) at 09/29/2020 0742 Last data filed at 09/29/2020 0600 Gross per 24 hour  Intake 1019.93 ml  Output 425 ml  Net 594.93 ml       Labs: Basic Metabolic Panel: Recent Labs  Lab 09/24/20 0425 09/26/20 0952 09/28/20 0549 09/28/20 1315 09/29/20 0406  NA 130* 127* 130* 130* 131*  K 3.9 4.2 2.7* 4.9 4.9  CL 95* 88* 89* 93* 93*  CO2 23 20* 25 24 24   GLUCOSE 165* 122* 214* 80 125*  BUN 59* 119* 56* 66* 78*  CREATININE 1.11 1.66* 0.98 1.05 1.24  CALCIUM 9.0 9.3 8.7* 8.2* 8.8*  PHOS 3.6 6.7*  --   --   --    Liver Function Tests: Recent Labs  Lab 09/24/20 0425 09/26/20 0952  ALBUMIN 1.5* 1.7*   No results for input(s): LIPASE, AMYLASE in the last 168 hours. No results for input(s): AMMONIA in the last 168 hours. CBC: Recent Labs  Lab 09/23/20 1542 09/24/20 0425 09/25/20 0347 09/28/20 0549 09/29/20 0406  WBC 22.5* 25.4* 19.6* 21.1* 19.7*  HGB 10.2* 7.8* 8.0* 9.1* 8.8*  HCT 31.5* 24.5* 24.6* 28.0* 26.9*  MCV 78.2* 79.3* 77.1* 76.7* 76.4*  PLT 510* 420* 426* 459* 463*   Cardiac Enzymes: No results for input(s): CKTOTAL, CKMB, CKMBINDEX, TROPONINI in the last 168 hours. CBG: Recent Labs  Lab 09/28/20 1104 09/28/20 1535 09/28/20 1916 09/28/20 2314 09/29/20 0335  GLUCAP 128*  72 75 99 125*    Iron Studies: No results for input(s): IRON, TIBC, TRANSFERRIN, FERRITIN in the last 72 hours.  Studies/Results: DG ABDOMEN PEG TUBE LOCATION  Result Date: 09/28/2020 CLINICAL DATA:  G-tube replacement EXAM: ABDOMEN - 1 VIEW COMPARISON:  None. CONTRAST:  30 mL Gastrografin FINDINGS: Injection of contrast through the gastrostomy tube demonstrates intraluminal positioning. IMPRESSION: Intraluminal gastrostomy tube. Electronically Signed   By: Macy Mis M.D.   On: 09/28/2020 15:25    Medications: Infusions:  sodium chloride 10 mL/hr at 10/06/2020 1429   sodium chloride  500 mL (09/01/20 1408)   sodium chloride     sodium chloride     feeding supplement (NEPRO CARB STEADY) 45 mL/hr at 09/28/20 1800   sodium chloride      Scheduled Medications:  amiodarone  200 mg Per Tube Daily   apixaban  5 mg Per Tube BID   buPROPion  75 mg Per Tube BID   chlorhexidine gluconate (MEDLINE KIT)  15 mL Mouth Rinse BID   Chlorhexidine Gluconate Cloth  6 each Topical Daily   Chlorhexidine Gluconate Cloth  6 each Topical Q0600   darbepoetin (ARANESP) injection - DIALYSIS  100 mcg Intravenous Q Tue-HD   feeding supplement (PROSource TF)  45 mL Per Tube TID   guaiFENesin  5 mL Per Tube BID   insulin aspart  0-9 Units Subcutaneous Q4H   insulin glargine-yfgn  30 Units Subcutaneous Daily   lidocaine  1 patch Transdermal Q24H   mouth rinse  15 mL Mouth Rinse 10 times per day   metaxalone  800 mg Per Tube TID   midodrine  5 mg Per Tube TID   multivitamin  1 tablet Per Tube QHS   pantoprazole sodium  40 mg Per Tube Daily   sertraline  100 mg Per Tube Daily   simethicone  40 mg Per Tube QID   sodium chloride flush  10-40 mL Intracatheter Q12H    have reviewed scheduled and prn medications.  Physical Exam:  General:NAD, tracheostomy on vent. Heart:RRR, s1s2 nl Lungs: trach, no increased work of breathing. Abdomen:soft, Non-tender, PEG tube and ostomy bag Extremities:Trace lower extremities edema Neuro: awake Dialysis Access: LIJ TDC, LUE AVG faint T/B+, swelling of the left upper extremity noted (stable)  Asanti Craigo 09/29/2020,7:42 AM  LOS: 80 days

## 2020-09-30 DIAGNOSIS — R1310 Dysphagia, unspecified: Secondary | ICD-10-CM

## 2020-09-30 DIAGNOSIS — N186 End stage renal disease: Secondary | ICD-10-CM | POA: Diagnosis not present

## 2020-09-30 DIAGNOSIS — E876 Hypokalemia: Secondary | ICD-10-CM | POA: Diagnosis not present

## 2020-09-30 DIAGNOSIS — J9601 Acute respiratory failure with hypoxia: Secondary | ICD-10-CM | POA: Diagnosis not present

## 2020-09-30 LAB — BASIC METABOLIC PANEL
Anion gap: 13 (ref 5–15)
BUN: 17 mg/dL (ref 8–23)
CO2: 26 mmol/L (ref 22–32)
Calcium: 9 mg/dL (ref 8.9–10.3)
Chloride: 94 mmol/L — ABNORMAL LOW (ref 98–111)
Creatinine, Ser: 0.37 mg/dL — ABNORMAL LOW (ref 0.61–1.24)
GFR, Estimated: >60 mL/min (ref >60)
Glucose, Bld: 124 mg/dL — ABNORMAL HIGH (ref 70–99)
Potassium: 3.3 mmol/L — ABNORMAL LOW (ref 3.5–5.1)
Sodium: 133 mmol/L — ABNORMAL LOW (ref 135–145)

## 2020-09-30 LAB — CBC
HCT: 29.9 % — ABNORMAL LOW (ref 39.0–52.0)
Hemoglobin: 9.7 g/dL — ABNORMAL LOW (ref 13.0–17.0)
MCH: 24.9 pg — ABNORMAL LOW (ref 26.0–34.0)
MCHC: 32.4 g/dL (ref 30.0–36.0)
MCV: 76.7 fL — ABNORMAL LOW (ref 80.0–100.0)
Platelets: 503 10*3/uL — ABNORMAL HIGH (ref 150–400)
RBC: 3.9 MIL/uL — ABNORMAL LOW (ref 4.22–5.81)
RDW: 19.8 % — ABNORMAL HIGH (ref 11.5–15.5)
WBC: 18.7 10*3/uL — ABNORMAL HIGH (ref 4.0–10.5)
nRBC: 0.5 % — ABNORMAL HIGH (ref 0.0–0.2)

## 2020-09-30 LAB — GLUCOSE, CAPILLARY
Glucose-Capillary: 118 mg/dL — ABNORMAL HIGH (ref 70–99)
Glucose-Capillary: 120 mg/dL — ABNORMAL HIGH (ref 70–99)
Glucose-Capillary: 124 mg/dL — ABNORMAL HIGH (ref 70–99)
Glucose-Capillary: 129 mg/dL — ABNORMAL HIGH (ref 70–99)
Glucose-Capillary: 141 mg/dL — ABNORMAL HIGH (ref 70–99)
Glucose-Capillary: 143 mg/dL — ABNORMAL HIGH (ref 70–99)

## 2020-09-30 MED ORDER — HEPARIN SODIUM (PORCINE) 1000 UNIT/ML IJ SOLN
INTRAMUSCULAR | Status: AC
  Filled 2020-09-30: qty 4

## 2020-09-30 MED ORDER — POTASSIUM CHLORIDE 20 MEQ PO PACK
20.0000 meq | PACK | Freq: Once | ORAL | Status: AC
  Administered 2020-09-30: 20 meq
  Filled 2020-09-30: qty 1

## 2020-09-30 MED ORDER — POTASSIUM CHLORIDE 20 MEQ PO PACK: 40.0000 meq | PACK | ORAL | Status: DC

## 2020-09-30 MED ORDER — POTASSIUM CHLORIDE 20 MEQ PO PACK
40.0000 meq | PACK | Freq: Once | ORAL | Status: AC
  Administered 2020-09-30: 40 meq via ORAL
  Filled 2020-09-30: qty 2

## 2020-09-30 NOTE — Progress Notes (Signed)
[facility] NEPHROLOGY PROGRESS NOTE  Assessment/ Plan:  # Acute kidney Injury-prolonged dialysis dependency, now progressed to ESRD:  ATN from septic shock-without any evidence of renal recovery to date . initiated on CRRT 07/18/20 and transitioned to IHD.  Has [facility] & AVG, continue with HD on TTS schedule for now.  -Unable to sit on chair or recliner.  # Vascular access - had left forearm AVG placed 08/15/2020 but unfortunately had clotted.  s/p thrombectomy and revision 9/2, graft can be used 4 weeks thereafter.  Swelling of left upper extremity noted continue arm elevation and supportive care. Fortunately there is still a b/t especially on auscultation today--monitor closely  # Septic shock/ventilator associated pneumonia due to recurrent Acinetobacter infection: Off antibiotics and stable hemodynamics.  #Leukocytosis -persistent, will defer to primary service for further mgmt  #  Chronic respiratory failure: Status post tracheostomy with ventilator management per CCM.  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  # Chronic kidney disease/metabolic bone disease: Calcium and phosphorus level under control with hemodialysis, off binders  # Anemia: contiue ESA, monitor Hb.  Transfuse as needed.  # Acute metabolic encephalopathy: Continue management as above.,  Supportive care.  # Disposition - pt cannot return to [facility] now that he is ESRD. He is going to [facility] in [place], [place]. He needs to have [place] Medicaid & his AVG needs to be mature prior to being accepted. Other possible barrier is pt is a permanent trach & vent pt.     Subjective: Seen and examined in ICU. No acute events. HD pushed to overnight given inpatient census/staffing. Net uf 2L Objective Vital signs in last 24 hours: Vitals:   09/30/20 0428 09/30/20 0500 09/30/20 0600 09/30/20 0700  BP:  118/76 (!) 156/91 122/81  Pulse:  (!) 108 (!) 104 99  Resp:  _0 Temp:    97.9 F (36.6 C)  TempSrc:    Oral  SpO2: 100% 96% 100% 100%  Weight:      Height:       Weight change:   Intake/Output Summary (Last 24 hours) at 09/30/2020 0810 Last data filed at 09/30/2020 0600 Gross per 24 hour  Intake 1439.3 ml  Output 2075 ml  Net -635.7 ml       Labs: Basic Metabolic Panel: Recent Labs  Lab 09/24/20 0425 09/26/20 0952 09/28/20 0549 09/28/20 1315 09/29/20 0406 09/30/20 0457  NA 130* 127*   < > 130* 131* 133*  K 3.9 4.2   < > 4.9 4.9 3.3*  CL 95* 88*   < > 93* 93* 94*  CO2 23 20*   < > _1 GLUCOSE 165* 122*   < > 80 125* 124*  BUN 59* 119*   < > 66* 78* 17  CREATININE 1.11 1.66*   < > 1.05 1.24 0.37*  CALCIUM 9.0 9.3   < > 8.2* 8.8* 9.0  PHOS 3.6 6.7*  --   --   --   --    < > = values in this interval not displayed.   Liver Function Tests: Recent Labs  Lab 09/24/20 0425 09/26/20 0952  ALBUMIN 1.5* 1.7*   No results for input(s): LIPASE, AMYLASE in the last 168 hours. No results for input(s): AMMONIA in the last 168 hours. CBC: Recent Labs  Lab 09/24/20 0425 09/25/20 0347 09/28/20 0549 09/29/20 0406 09/30/20 0457  WBC 25.4* 19.6* 21.1* 19.7* 18.7*  HGB 7.8* 8.0* 9.1* 8.8* 9.7*  HCT 24.5* 24.6* 28.0* 26.9* 29.9*  MCV 79.3* 77.1* 76.7* 76.4* 76.7*  PLT 420* 426* 459* 463* 503*   Cardiac Enzymes: No results for input(s): CKTOTAL, CKMB, CKMBINDEX, TROPONINI in the last 168 hours. CBG: Recent Labs  Lab 09/29/20 1513 09/29/20 1922 09/30/20 0058 09/30/20 0439 09/30/20 0749  GLUCAP 135* 129* 141* 129* 120*    Iron Studies: No results for input(s): IRON, TIBC, TRANSFERRIN, FERRITIN in the last 72 hours.  Studies/Results: DG ABDOMEN PEG TUBE LOCATION  Result Date: 09/28/2020 CLINICAL DATA:  G-tube replacement EXAM: ABDOMEN - 1 VIEW COMPARISON:  None. CONTRAST:  30 mL Gastrografin FINDINGS: Injection of contrast through the gastrostomy tube demonstrates intraluminal positioning. IMPRESSION: Intraluminal  gastrostomy tube. Electronically Signed   By: Macy Mis M.D.   On: 09/28/2020 15:25    Medications: Infusions:  sodium chloride 10 mL/hr at 10/01/2020 1429   sodium chloride 500 mL (09/01/20 1408)   sodium chloride     sodium chloride     feeding supplement (NEPRO CARB STEADY) 1,000 mL (09/29/20 1610)    Scheduled Medications:  amiodarone  200 mg Per Tube Daily   apixaban  5 mg Per Tube BID   buPROPion  75 mg Per Tube BID   chlorhexidine gluconate (MEDLINE KIT)  15 mL Mouth Rinse BID   Chlorhexidine Gluconate Cloth  6 each Topical Daily   Chlorhexidine Gluconate Cloth  6 each Topical Q0600   darbepoetin (ARANESP) injection - DIALYSIS  100 mcg Intravenous Q Tue-HD   feeding supplement (PROSource TF)  45 mL Per Tube TID   guaiFENesin  5 mL Per Tube BID   heparin sodium (porcine)       insulin aspart  0-9 Units Subcutaneous Q4H   insulin glargine-yfgn  30 Units Subcutaneous Daily   lidocaine  1 patch Transdermal Q24H   mouth rinse  15 mL Mouth Rinse 10 times per day   metaxalone  800 mg Per Tube TID   midodrine  5 mg Per Tube TID   multivitamin  1 tablet Per Tube QHS   pantoprazole sodium  40 mg Per Tube Daily   potassium chloride  40 mEq Oral Once   sertraline  100 mg Per Tube Daily   simethicone  40 mg Per Tube QID   sodium chloride flush  10-40 mL Intracatheter Q12H    have reviewed scheduled and prn medications.  Physical Exam:  General:NAD, tracheostomy on vent. Heart:RRR, s1s2 nl Lungs: trach, no increased work of breathing. Abdomen:soft, Non-tender, PEG tube and ostomy bag Extremities:Trace lower extremities edema Neuro: awake Dialysis Access: LIJ TDC, LUE AVG faint T/B+ (bruit w/ auscultation), swelling of the left upper extremity noted (stable)  Luis [facility]r 09/30/2020,8:10 AM  LOS: 81 days

## 2020-09-30 NOTE — Progress Notes (Signed)
PROGRESS NOTE    Luis Orr   PZW:[id number]  DOB: [birth year]  DOA: 06/24/2020 PCP: Townsend Roger, MD   Brief Narrative:  Luis Orr is a 70 year old male with C3-C5 compressive myelopathy and subsequent quadriplegia who is chronically on a trach and vent.  He presented from [facility] facility with altered mental status hypotension and pneumonia with resultant septic shock and was admitted to the ICU.  He is noted to have multiple admissions for infections including bacteremia, UTI and pneumonia and has grown out multidrug-resistant Klebsiella, MRSA and Pseudomonas.  His most recent admission was in 9/21 due to multidrug-resistant Klebsiella, right ureteral calculus requiring a double-J stent and was treated with meropenem.  He was discharged back to [facility].  The patient was admitted to the ICU on 6/29 and treated for septic shock. 7/2 ID Consult for MDR acinetobacter in trach asp, Meropenem changed to unasyn 7/2 PRBC transfusion, iron replacement 7/5 Pressors added again. Central line placed. Drainage from gastrostomy tube >> CT Abd without fluid collection around gastrostomy tube but with diffuse body wall edema. Echo EF 50-55%, mild LVH 7/6 Renal Replacement Therapy ; Code status changed to partial Code (DNR) 7/7 Transfuse 1U PRBC 7/9 add solu cortef 7/10 off pressors, weaning stress steroids 7/11 Pressors added back on again 7/12 ID consult due to rising WBC, hypothermia, hypotension concern for worsening infection. Vancomycin added. Repeat blood cx collected. 7/15 CRRT stopped 7/17 Restarted on low dose levo. No evidence of new infection. Held off on antibiotics. Nephrology contacted family and decision was made to continue iHD this week to allow additional time for renal recovery. Patient is not the best long term iHD candidate but family wants aggressive care 7/18 plans for iHD, tolerated with 2L removed 719 slight drop in hgb to 6.9 will transfuse 1 unit  PRBC 7/22 > 7/26: Tolerating HD. TOC consulted for placement. Will likely need out of state placement. 8/15 WBCs greater than 28,000, hypoglycemia, procalcitonin >18, chest x-ray consistent with pneumonia, recent blood cultures consistent with contamination.  Unasyn IV resumed after sputum culture obtained 8/16 consultation placed with ethics committee regarding evaluation for futility of care 8/16 Sputum cx + for Acinetobacter with sensitivities pending 8/17 extensive conversation held with patient's wife regarding patient's poor prognosis and reemergence of Acinetobacter VAP.  Also discussed with her that patient has been able to convey to multiple staff that he does not want to continue on the ventilator or with dialysis.  Wife reports that the patient does not understand what he is saying, she is the POA and that she wants to continue aggressive care. 8/18 patient now telling nephrology, myself and the rest of the staff he replies yes to wanting to remain on dialysis and on the ventilator and this direct question is asked. 8/18 Acinetobacter sensitive to Unasyn only.  Discussed with ID/Dr. Juleen China.  Plan is to administer antibiotics for 7-day since patient has not had any increased O2 needs and remains afebrile.  If any concerns over worsening symptoms can give an additional 3 days. Developed moderate bleeding around tracheostomy site on 8/19.  Likely related to combination of Eliquis and heparin required for CRRT.  We will hold Eliquis for now and monitor degree of bleeding Eliquis resumed on 8/22.  Overnight into 8/23 patient bit his tongue and had bleeding.  Also had some bleeding around trach likely from oral bleeding.  Also though did have bleeding from old central line site. 8/23 had transient bleeding around trach and prior  central line insertion site overnight.  Patient had bitten tongue and this likely explain the bleeding.  This morning patient was not having any bleeding.  On 8/25 had  redeveloped slow ooze bleeding from mouth and trach.  Eliquis discontinued in favor of IV heparin especially given patient will undergo surgical procedure for vascular access on 8/29 8/30 Left FA AVG occluded 9/2 status post thrombectomy and revision of LUE AV graft 9/4 LUE edema with negative venous duplex. 9/5 heparin discontinued and Eliquis resumed     Subjective: No complaints.     Assessment & Plan:   Principal Problem:   Acute respiratory failure with hypoxia superimposed on chronic respiratory failure Recurrent ventilator associated pneumonias Septic shock -Developed Acinetobacter pneumonia on 8/14 and completed 7 days of Unasyn -Remains on the ventilator and is stable at this time -Appreciate PCCM managing ventilator  Active Problems:  PEG tube complication - 1/61> PEG came out while RNs were doing wound care -  PEG was replaced same day by IR - cont tube feeds meds via PEG  Hypokalemia - K 2.7 on 9/16- replacing again today   Diarrhea - 9/16> > 800 cc of stool in 24 hr period per documentation - sent stool studies which are still pending - WBC count continue to be elevated through out hospital stay - follow  AKI with oliguria Now end-stage renal disease - initially suspected to be ATN related to septic shock -Unfortunately, renal recovery has not occurred -Imaging revealed marked atrophy of the right kidney- Double-J ureteral stent is still present - Appreciate nephrology management of this-she continues to require dialysis via HD catheter on Tuesday Thursday Saturdays - Has left arm AV graft that was placed on 8/29 however, this has clotted-he had a thrombectomy and revision on 9/2 - Graft will need to mature for 4 weeks after revision before it can be utilized - Midodrine being used for hypotension and tachycardia  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 into the sacrum- (POA) - stage 2 pressure ulcer right heel - CT abdomen and pelvis performed on  the 12th for further work-up of leukocytosis reveals that the sacral decubitus ulcer is eroding into the distal sacrum with gas tracking into the right gluteus maximus muscle-no fluid noted - 9/15> blood noted on dressing - there was no pus, but foul odor noted- I asked General surgery for an opinion on wound care on 9/15- they debrided the wound at bedside-  no further bleeding noted  Persistently elevated WBC count - WBC count has been continued continuously elevated but fluctuates and has been as high as the 30s - no fevers noted- mild tachycardia last night- following closely - WBC 18 today  Hartmann pouch with colostomy -Loose stool noted in colostomy today-abdomen is slightly distended -Of note, recent CT scan showed > nodular irregular thickening of the ascending colon, stable since immediate prior examinationsGI was consulted-at this point, GI does not recommend a colonoscopy   - dc'd antibiotics -9/14-  following  - high risk for C diff with recurrent antibiotics  Insulin-dependent diabetes mellitus -Semglee and ISS being used Hemoglobin A1C    Component Value Date/Time   HGBA1C 6.3 (H) 06/30/2020 2143   Microcytic anemia - ferritin 282- likely elevated (acute phase reactant) in setting of multiple infections and acute stress - iron level 27 and saturation 12  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 PEG tube- severe protein calorie malnutrition -Continue tube feeds and supportive care -Receiving Skelaxin  Left upper arm edema -  Related to above-mentioned clotted graft which is status post revision  A-fib, paroxysmal - cont Amiodarone and Eliquis  Time spent in minutes:  30 DVT prophylaxis: Place and maintain sequential compression device Start: 07/31/20 1614 apixaban (ELIQUIS) tablet 5 mg  Code Status: Partial- No CPR- give ACLS meds Family Communication:  Level of Care: Level of care: ICU Disposition Plan:  Status is: Inpatient  Remains inpatient  appropriate because:Unsafe d/c plan  Dispo: The patient is from:  [facility]              Anticipated d/c is to:  SNF              Patient currently is not medically stable to d/c.   Difficult to place patient yes  Consultants:  Pulmonary critical care Nephrology Infectious disease Palliative medicine  Procedures:  HD catheter AV fistula with declotting and revision   Objective: Vitals:   09/30/20 1100 09/30/20 1145 09/30/20 1200 09/30/20 1300  BP: (!) 135/93  123/83 100/76  Pulse: 99     Resp: 20  20 20   Temp:      TempSrc:      SpO2: 100% 100%    Weight:      Height:        Intake/Output Summary (Last 24 hours) at 09/30/2020 1329 Last data filed at 09/30/2020 1300 Gross per 24 hour  Intake 1250 ml  Output 2175 ml  Net -925 ml    Filed Weights   09/27/20 1945 09/27/20 2345 09/28/20 0500  Weight: 84.2 kg 82.5 kg 81.2 kg    Examination: General exam: Appears comfortable  HEENT: PERRL no sclera icterus or thrush- trach present Respiratory system: Clear to auscultation. Respiratory effort normal. Cardiovascular system: S1 & S2 heard, regular rate and rhythm Gastrointestinal system: Abdomen soft, non-tender, nondistended. Normal bowel sounds   Central nervous system: Alert- quadriplegic Extremities: No cyanosis, clubbing - left arm edema noted Skin: see pictures             Data Reviewed: I have personally reviewed following labs and imaging studies  CBC: Recent Labs  Lab 09/24/20 0425 09/25/20 0347 09/28/20 0549 09/29/20 0406 09/30/20 0457  WBC 25.4* 19.6* 21.1* 19.7* 18.7*  HGB 7.8* 8.0* 9.1* 8.8* 9.7*  HCT 24.5* 24.6* 28.0* 26.9* 29.9*  MCV 79.3* 77.1* 76.7* 76.4* 76.7*  PLT 420* 426* 459* 463* 503*    Basic Metabolic Panel: Recent Labs  Lab 09/24/20 0425 09/26/20 0952 09/28/20 0549 09/28/20 1315 09/29/20 0406 09/30/20 0457  NA 130* 127* 130* 130* 131* 133*  K 3.9 4.2 2.7* 4.9 4.9 3.3*  CL 95* 88* 89* 93* 93* 94*  CO2 23  20* 25 24 24 26   GLUCOSE 165* 122* 214* 80 125* 124*  BUN 59* 119* 56* 66* 78* 17  CREATININE 1.11 1.66* 0.98 1.05 1.24 0.37*  CALCIUM 9.0 9.3 8.7* 8.2* 8.8* 9.0  MG  --   --  2.1  --   --   --   PHOS 3.6 6.7*  --   --   --   --     GFR: Estimated Creatinine Clearance: 86 mL/min (A) (by C-G formula based on SCr of 0.37 mg/dL (L)). Liver Function Tests: Recent Labs  Lab 09/24/20 0425 09/26/20 0952  ALBUMIN 1.5* 1.7*    No results for input(s): LIPASE, AMYLASE in the last 168 hours. No results for input(s): AMMONIA in the last 168 hours. Coagulation Profile: No results for input(s): INR, PROTIME in the last 168 hours.  Cardiac Enzymes: No results for input(s): CKTOTAL, CKMB, CKMBINDEX, TROPONINI in the last 168 hours. BNP (last 3 results) No results for input(s): PROBNP in the last 8760 hours. HbA1C: No results for input(s): HGBA1C in the last 72 hours. CBG: Recent Labs  Lab 09/29/20 1922 09/30/20 0058 09/30/20 0439 09/30/20 0749 09/30/20 1114  GLUCAP 129* 141* 129* 120* 143*    Lipid Profile: No results for input(s): CHOL, HDL, LDLCALC, TRIG, CHOLHDL, LDLDIRECT in the last 72 hours. Thyroid Function Tests: No results for input(s): TSH, T4TOTAL, FREET4, T3FREE, THYROIDAB in the last 72 hours. Anemia Panel: No results for input(s): VITAMINB12, FOLATE, FERRITIN, TIBC, IRON, RETICCTPCT in the last 72 hours. Urine analysis:    Component Value Date/Time   COLORURINE YELLOW 07/25/2020 0334   APPEARANCEUR CLEAR 07/25/2020 0334   LABSPEC 1.020 07/25/2020 0334   PHURINE 6.5 07/25/2020 0334   GLUCOSEU 100 (A) 07/25/2020 0334   HGBUR LARGE (A) 07/25/2020 0334   BILIRUBINUR SMALL (A) 07/25/2020 0334   KETONESUR NEGATIVE 07/25/2020 0334   PROTEINUR >300 (A) 07/25/2020 0334   NITRITE NEGATIVE 07/25/2020 0334   LEUKOCYTESUR MODERATE (A) 07/25/2020 0334   Sepsis Labs: @LABRCNTIP (procalcitonin:4,lacticidven:4) )No results found for this or any previous visit (from the past  240 hour(s)).       Radiology Studies: DG ABDOMEN PEG TUBE LOCATION  Result Date: 09/28/2020 CLINICAL DATA:  G-tube replacement EXAM: ABDOMEN - 1 VIEW COMPARISON:  None. CONTRAST:  30 mL Gastrografin FINDINGS: Injection of contrast through the gastrostomy tube demonstrates intraluminal positioning. IMPRESSION: Intraluminal gastrostomy tube. Electronically Signed   By: Macy Mis M.D.   On: 09/28/2020 15:25      Scheduled Meds:  amiodarone  200 mg Per Tube Daily   apixaban  5 mg Per Tube BID   buPROPion  75 mg Per Tube BID   chlorhexidine gluconate (MEDLINE KIT)  15 mL Mouth Rinse BID   Chlorhexidine Gluconate Cloth  6 each Topical Daily   Chlorhexidine Gluconate Cloth  6 each Topical Q0600   darbepoetin (ARANESP) injection - DIALYSIS  100 mcg Intravenous Q Tue-HD   feeding supplement (PROSource TF)  45 mL Per Tube TID   guaiFENesin  5 mL Per Tube BID   insulin aspart  0-9 Units Subcutaneous Q4H   insulin glargine-yfgn  30 Units Subcutaneous Daily   lidocaine  1 patch Transdermal Q24H   mouth rinse  15 mL Mouth Rinse 10 times per day   metaxalone  800 mg Per Tube TID   midodrine  5 mg Per Tube TID   multivitamin  1 tablet Per Tube QHS   pantoprazole sodium  40 mg Per Tube Daily   sertraline  100 mg Per Tube Daily   simethicone  40 mg Per Tube QID   sodium chloride flush  10-40 mL Intracatheter Q12H   Continuous Infusions:  sodium chloride 10 mL/hr at 09/18/2020 1429   sodium chloride 500 mL (09/01/20 1408)   sodium chloride     sodium chloride     feeding supplement (NEPRO CARB STEADY) 1,000 mL (09/29/20 1610)     LOS: 81 days      Debbe Odea, MD [facility] Pager: [email or web address] 09/30/2020, 1:29 PM

## 2020-09-30 NOTE — Progress Notes (Addendum)
Video connection link was texted to participant via mobile number provided. Link has been sent x3. Will notify BS nurse to contact wife to see if she is receiving links.  2200  Multiple attempts to send link to pt's wife unsuccessful.  Wife stated to [facility] nurse that the links would not connect that they were old links.  Asked to have her delete those from her phone, and that I would resend the link again.  States that she did as per BS nurse, and the link was resent.  Wife hung up the phone, but still has not connected.

## 2020-09-30 NOTE — Progress Notes (Signed)
[place] Progress Note Patient Name: Luis Orr DOB: Aug 23, 1950 MRN: [id number]   Date of Service  09/30/2020  HPI/Events of Note  potassium 3.3, cret 0.37, GFR 60, is a HD pt  eICU Interventions  - has a peg tube also. Will replace 20 meq via PEG Discussed with RN.      Intervention Category Intermediate Interventions: Electrolyte abnormality - evaluation and management  Elmer Sow 09/30/2020, 6:09 AM

## 2020-10-01 DIAGNOSIS — J9601 Acute respiratory failure with hypoxia: Secondary | ICD-10-CM | POA: Diagnosis not present

## 2020-10-01 LAB — GASTROINTESTINAL PANEL BY PCR, STOOL (REPLACES STOOL CULTURE)

## 2020-10-01 LAB — BASIC METABOLIC PANEL
Anion gap: 14 (ref 5–15)
BUN: 59 mg/dL — ABNORMAL HIGH (ref 8–23)
CO2: 22 mmol/L (ref 22–32)
Calcium: 8.7 mg/dL — ABNORMAL LOW (ref 8.9–10.3)
Chloride: 94 mmol/L — ABNORMAL LOW (ref 98–111)
Creatinine, Ser: 1 mg/dL (ref 0.61–1.24)
GFR, Estimated: >60 mL/min (ref >60)
Glucose, Bld: 146 mg/dL — ABNORMAL HIGH (ref 70–99)
Potassium: 5.1 mmol/L (ref 3.5–5.1)
Sodium: 130 mmol/L — ABNORMAL LOW (ref 135–145)

## 2020-10-01 LAB — GLUCOSE, CAPILLARY
Glucose-Capillary: 116 mg/dL — ABNORMAL HIGH (ref 70–99)
Glucose-Capillary: 120 mg/dL — ABNORMAL HIGH (ref 70–99)
Glucose-Capillary: 127 mg/dL — ABNORMAL HIGH (ref 70–99)
Glucose-Capillary: 128 mg/dL — ABNORMAL HIGH (ref 70–99)
Glucose-Capillary: 131 mg/dL — ABNORMAL HIGH (ref 70–99)
Glucose-Capillary: 137 mg/dL — ABNORMAL HIGH (ref 70–99)
Glucose-Capillary: 141 mg/dL — ABNORMAL HIGH (ref 70–99)

## 2020-10-01 LAB — CBC
HCT: 25.4 % — ABNORMAL LOW (ref 39.0–52.0)
HCT: 24.1 % — ABNORMAL LOW (ref 39.0–52.0)
Hemoglobin: 7.9 g/dL — ABNORMAL LOW (ref 13.0–17.0)
Hemoglobin: 7.5 g/dL — ABNORMAL LOW (ref 13.0–17.0)
MCH: 24.5 pg — ABNORMAL LOW (ref 26.0–34.0)
MCH: 24.4 pg — ABNORMAL LOW (ref 26.0–34.0)
MCHC: 31.1 g/dL (ref 30.0–36.0)
MCHC: 31.1 g/dL (ref 30.0–36.0)
MCV: 78.6 fL — ABNORMAL LOW (ref 80.0–100.0)
MCV: 78.5 fL — ABNORMAL LOW (ref 80.0–100.0)
Platelets: 459 10*3/uL — ABNORMAL HIGH (ref 150–400)
Platelets: 431 10*3/uL — ABNORMAL HIGH (ref 150–400)
RBC: 3.23 MIL/uL — ABNORMAL LOW (ref 4.22–5.81)
RBC: 3.07 MIL/uL — ABNORMAL LOW (ref 4.22–5.81)
RDW: 19.7 % — ABNORMAL HIGH (ref 11.5–15.5)
RDW: 19.5 % — ABNORMAL HIGH (ref 11.5–15.5)
WBC: 17.4 10*3/uL — ABNORMAL HIGH (ref 4.0–10.5)
WBC: 15.3 10*3/uL — ABNORMAL HIGH (ref 4.0–10.5)
nRBC: 0.3 % — ABNORMAL HIGH (ref 0.0–0.2)
nRBC: 0.5 % — ABNORMAL HIGH (ref 0.0–0.2)

## 2020-10-01 LAB — MAGNESIUM: Magnesium: 2.1 mg/dL (ref 1.7–2.4)

## 2020-10-01 MED ORDER — PHENYLEPHRINE HCL-NACL 20-0.9 MG/250ML-% IV SOLN
INTRAVENOUS | Status: AC
  Filled 2020-10-01: qty 500

## 2020-10-01 MED ORDER — CHLORHEXIDINE GLUCONATE CLOTH 2 % EX PADS
6.0000 | MEDICATED_PAD | Freq: Every day | CUTANEOUS | Status: DC
  Administered 2020-10-01 – 2020-10-05 (×5): 6 via TOPICAL

## 2020-10-01 NOTE — Progress Notes (Signed)
PROGRESS NOTE    Luis Orr   WNU:[id number]  DOB: 1950-04-30  DOA: 07/01/2020 PCP: Townsend Roger, MD   Brief Narrative:  Luis Orr is a 70 year old male with C3-C5 compressive myelopathy and subsequent quadriplegia who is chronically on a trach and vent.  He presented from [place] facility with altered mental status hypotension and pneumonia with resultant septic shock and was admitted to the ICU.  He is noted to have multiple admissions for infections including bacteremia, UTI and pneumonia and has grown out multidrug-resistant Klebsiella, MRSA and Pseudomonas.  His most recent admission was in 9/21 due to multidrug-resistant Klebsiella, right ureteral calculus requiring a double-J stent and was treated with meropenem.  He was discharged back to [facility].  The patient was admitted to the ICU on 6/29 and treated for septic shock. 7/2 ID Consult for MDR acinetobacter in trach asp, Meropenem changed to unasyn 7/2 PRBC transfusion, iron replacement 7/5 Pressors added again. Central line placed. Drainage from gastrostomy tube >> CT Abd without fluid collection around gastrostomy tube but with diffuse body wall edema. Echo EF 50-55%, mild LVH 7/6 Renal Replacement Therapy ; Code status changed to partial Code (DNR) 7/7 Transfuse 1U PRBC 7/9 add solu cortef 7/10 off pressors, weaning stress steroids 7/11 Pressors added back on again 7/12 ID consult due to rising WBC, hypothermia, hypotension concern for worsening infection. Vancomycin added. Repeat blood cx collected. 7/15 CRRT stopped 7/17 Restarted on low dose levo. No evidence of new infection. Held off on antibiotics. Nephrology contacted family and decision was made to continue iHD this week to allow additional time for renal recovery. Patient is not the best long term iHD candidate but family wants aggressive care 7/18 plans for iHD, tolerated with 2L removed 719 slight drop in hgb to 6.9 will transfuse 1 unit  PRBC 7/22 > 7/26: Tolerating HD. TOC consulted for placement. Will likely need out of state placement. 8/15 WBCs greater than 28,000, hypoglycemia, procalcitonin >18, chest x-ray consistent with pneumonia, recent blood cultures consistent with contamination.  Unasyn IV resumed after sputum culture obtained 8/16 consultation placed with ethics committee regarding evaluation for futility of care 8/16 Sputum cx + for Acinetobacter with sensitivities pending 8/17 extensive conversation held with patient's wife regarding patient's poor prognosis and reemergence of Acinetobacter VAP.  Also discussed with her that patient has been able to convey to multiple staff that he does not want to continue on the ventilator or with dialysis.  Wife reports that the patient does not understand what he is saying, she is the POA and that she wants to continue aggressive care. 8/18 patient now telling nephrology, myself and the rest of the staff he replies yes to wanting to remain on dialysis and on the ventilator and this direct question is asked. 8/18 Acinetobacter sensitive to Unasyn only.  Discussed with ID/Dr. Juleen China.  Plan is to administer antibiotics for 7-day since patient has not had any increased O2 needs and remains afebrile.  If any concerns over worsening symptoms can give an additional 3 days. Developed moderate bleeding around tracheostomy site on 8/19.  Likely related to combination of Eliquis and heparin required for CRRT.  We will hold Eliquis for now and monitor degree of bleeding Eliquis resumed on 8/22.  Overnight into 8/23 patient bit his tongue and had bleeding.  Also had some bleeding around trach likely from oral bleeding.  Also though did have bleeding from old central line site. 8/23 had transient bleeding around trach and prior  central line insertion site overnight.  Patient had bitten tongue and this likely explain the bleeding.  This morning patient was not having any bleeding.  On 8/25 had  redeveloped slow ooze bleeding from mouth and trach.  Eliquis discontinued in favor of IV heparin especially given patient will undergo surgical procedure for vascular access on 8/29 8/30 Left FA AVG occluded 9/2 status post thrombectomy and revision of LUE AV graft 9/4 LUE edema with negative venous duplex. 9/5 heparin discontinued and Eliquis resumed     Subjective: No complaints.     Assessment & Plan:   Principal Problem:   Acute respiratory failure with hypoxia superimposed on chronic respiratory failure Recurrent ventilator associated pneumonias Septic shock -Developed Acinetobacter pneumonia on 8/14 and completed 7 days of Unasyn -Remains on the ventilator and is stable at this time -Appreciate PCCM managing ventilator  Active Problems:  PEG tube complication - 0/16> PEG came out while RNs were doing wound care -  PEG was replaced same day by IR - cont tube feeds and meds via PEG  Hypokalemia - K 2.7 on 9/16- replacing and checking ontermittently   Diarrhea - 9/16> > 800 cc of stool in 24 hr period per documentation -  stool studies negative - stool output has decreased and is within normal limits  AKI with oliguria New end-stage renal disease - initially suspected to be ATN related to septic shock -Unfortunately, renal recovery has not occurred -Imaging revealed marked atrophy of the right kidney- Double-J ureteral stent is still present - Appreciate nephrology management of this-she continues to require dialysis via HD catheter on Tuesday Thursday Saturdays - Has left arm AV graft that was placed on 8/29 however, this has clotted-he had a thrombectomy and revision on 9/2 - Graft will need to mature for 4 weeks after revision before it can be utilized - Midodrine being used for hypotension and tachycardia  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 into the sacrum- (POA) - stage 2 pressure ulcer right heel - CT abdomen and pelvis performed on the 12th for  further work-up of leukocytosis reveals that the sacral decubitus ulcer is eroding into the distal sacrum with gas tracking into the right gluteus maximus muscle-no fluid noted - 9/15> blood noted on dressing - there was no pus, but foul odor noted- I asked General surgery for an opinion on wound care on 9/15- they debrided the wound at bedside-  no further bleeding noted  Persistently elevated WBC count - WBC count has been  continuously elevated but fluctuates and has been as high as the 30s - WBC steadily improving after debridement performed  Hartmann pouch with colostomy -Loose stool noted in colostomy today-abdomen is slightly distended -Of note, recent CT scan showed > nodular irregular thickening of the ascending colon, stable since immediate prior examinationsGI was consulted-at this point, GI does not recommend a colonoscopy   - dc'd antibiotics -9/14-  following  - high risk for C diff with recurrent antibiotics  Insulin-dependent diabetes mellitus -Semglee and ISS being used Hemoglobin A1C    Component Value Date/Time   HGBA1C 6.3 (H) 06/15/2020 2143   Microcytic anemia - ferritin 282- likely elevated (acute phase reactant) in setting of multiple infections and acute stress - iron level 27 and saturation 12 - replacement by nephrology  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 PEG tube- severe protein calorie malnutrition -Continue tube feeds and supportive care -Receiving Skelaxin  Left upper arm edema - Related to above-mentioned clotted graft which is  status post revision  A-fib, paroxysmal - cont Amiodarone and Eliquis  Time spent in minutes:  30 DVT prophylaxis: Place and maintain sequential compression device Start: 07/31/20 1614 apixaban (ELIQUIS) tablet 5 mg  Code Status: Partial- No CPR- give ACLS meds Family Communication:  Level of Care: Level of care: ICU Disposition Plan:  Status is: Inpatient  Remains inpatient appropriate  because:Unsafe d/c plan  Dispo: The patient is from:  [facility]              Anticipated d/c is to:  SNF              Patient currently is not medically stable to d/c.   Difficult to place patient yes  Consultants:  Pulmonary critical care Nephrology Infectious disease Palliative medicine  Procedures:  HD catheter AV fistula with declotting and revision   Objective: Vitals:   10/01/20 0947 10/01/20 1000 10/01/20 1100 10/01/20 1122  BP:  103/66 103/68   Pulse:   89 88  Resp: 20 20 20 20   Temp:      TempSrc:      SpO2:  100% 100% 100%  Weight:      Height:        Intake/Output Summary (Last 24 hours) at 10/01/2020 1201 Last data filed at 10/01/2020 1100 Gross per 24 hour  Intake 1390 ml  Output 535 ml  Net 855 ml    Filed Weights   09/27/20 2345 09/28/20 0500 10/01/20 0329  Weight: 82.5 kg 81.2 kg 78.8 kg    Examination: General exam: Appears comfortable  HEENT: PERRLno sclera icterus or thrush Respiratory system: Clear to auscultation. Respiratory effort normal. Trach present Cardiovascular system: S1 & S2 heard, regular rate and rhythm Gastrointestinal system: Abdomen soft, non-tender, nondistended. Normal bowel sounds  - PEG tube and colostomy noted Central nervous system: Alert - quadriplegic Extremities: No cyanosis, clubbing- edema of left arm  Skin: see pictures             Data Reviewed: I have personally reviewed following labs and imaging studies  CBC: Recent Labs  Lab 09/28/20 0549 09/29/20 0406 09/30/20 0457 10/01/20 0325 10/01/20 1035  WBC 21.1* 19.7* 18.7* 17.4* 15.3*  HGB 9.1* 8.8* 9.7* 7.9* 7.5*  HCT 28.0* 26.9* 29.9* 25.4* 24.1*  MCV 76.7* 76.4* 76.7* 78.6* 78.5*  PLT 459* 463* 503* 459* 431*    Basic Metabolic Panel: Recent Labs  Lab 09/26/20 0952 09/28/20 0549 09/28/20 1315 09/29/20 0406 09/30/20 0457 10/01/20 0325  NA 127* 130* 130* 131* 133* 130*  K 4.2 2.7* 4.9 4.9 3.3* 5.1  CL 88* 89* 93* 93* 94* 94*   CO2 20* 25 24 24 26 22   GLUCOSE 122* 214* 80 125* 124* 146*  BUN 119* 56* 66* 78* 17 59*  CREATININE 1.66* 0.98 1.05 1.24 0.37* 1.00  CALCIUM 9.3 8.7* 8.2* 8.8* 9.0 8.7*  MG  --  2.1  --   --   --  2.1  PHOS 6.7*  --   --   --   --   --     GFR: Estimated Creatinine Clearance: 67.9 mL/min (by C-G formula based on SCr of 1 mg/dL). Liver Function Tests: Recent Labs  Lab 09/26/20 0952  ALBUMIN 1.7*    No results for input(s): LIPASE, AMYLASE in the last 168 hours. No results for input(s): AMMONIA in the last 168 hours. Coagulation Profile: No results for input(s): INR, PROTIME in the last 168 hours. Cardiac Enzymes: No results for input(s): CKTOTAL, CKMB,  CKMBINDEX, TROPONINI in the last 168 hours. BNP (last 3 results) No results for input(s): PROBNP in the last 8760 hours. HbA1C: No results for input(s): HGBA1C in the last 72 hours. CBG: Recent Labs  Lab 09/30/20 1944 09/30/20 2333 10/01/20 0322 10/01/20 0757 10/01/20 1105  GLUCAP 118* 128* 131* 127* 141*    Lipid Profile: No results for input(s): CHOL, HDL, LDLCALC, TRIG, CHOLHDL, LDLDIRECT in the last 72 hours. Thyroid Function Tests: No results for input(s): TSH, T4TOTAL, FREET4, T3FREE, THYROIDAB in the last 72 hours. Anemia Panel: No results for input(s): VITAMINB12, FOLATE, FERRITIN, TIBC, IRON, RETICCTPCT in the last 72 hours. Urine analysis:    Component Value Date/Time   COLORURINE YELLOW 07/25/2020 Startup 07/25/2020 0334   LABSPEC 1.020 07/25/2020 0334   PHURINE 6.5 07/25/2020 0334   GLUCOSEU 100 (A) 07/25/2020 0334   HGBUR LARGE (A) 07/25/2020 0334   BILIRUBINUR SMALL (A) 07/25/2020 0334   KETONESUR NEGATIVE 07/25/2020 0334   PROTEINUR >300 (A) 07/25/2020 0334   NITRITE NEGATIVE 07/25/2020 0334   LEUKOCYTESUR MODERATE (A) 07/25/2020 0334   Sepsis Labs: @LABRCNTIP (procalcitonin:4,lacticidven:4) ) Recent Results (from the past 240 hour(s))  Gastrointestinal Panel by PCR ,  Stool     Status: None   Collection Time: 09/28/20  1:16 PM   Specimen: Stool  Result Value Ref Range Status   Campylobacter species NOT DETECTED NOT DETECTED Final   Plesimonas shigelloides NOT DETECTED NOT DETECTED Final   Salmonella species NOT DETECTED NOT DETECTED Final   Yersinia enterocolitica NOT DETECTED NOT DETECTED Final   Vibrio species NOT DETECTED NOT DETECTED Final   Vibrio cholerae NOT DETECTED NOT DETECTED Final   Enteroaggregative E coli (EAEC) NOT DETECTED NOT DETECTED Final   Enteropathogenic E coli (EPEC) NOT DETECTED NOT DETECTED Final   Enterotoxigenic E coli (ETEC) NOT DETECTED NOT DETECTED Final   Shiga like toxin producing E coli (STEC) NOT DETECTED NOT DETECTED Final   Shigella/Enteroinvasive E coli (EIEC) NOT DETECTED NOT DETECTED Final   Cryptosporidium NOT DETECTED NOT DETECTED Final   Cyclospora cayetanensis NOT DETECTED NOT DETECTED Final   Entamoeba histolytica NOT DETECTED NOT DETECTED Final   Giardia lamblia NOT DETECTED NOT DETECTED Final   Adenovirus F40/41 NOT DETECTED NOT DETECTED Final   Astrovirus NOT DETECTED NOT DETECTED Final   Norovirus GI/GII NOT DETECTED NOT DETECTED Final   Rotavirus A NOT DETECTED NOT DETECTED Final   Sapovirus (I, II, IV, and V) NOT DETECTED NOT DETECTED Final    Comment: Performed at [facility], [address], [place], [place] [address]         Radiology Studies: No results found.    Scheduled Meds:  amiodarone  200 mg Per Tube Daily   apixaban  5 mg Per Tube BID   buPROPion  75 mg Per Tube BID   chlorhexidine gluconate (MEDLINE KIT)  15 mL Mouth Rinse BID   Chlorhexidine Gluconate Cloth  6 each Topical Q0600   darbepoetin (ARANESP) injection - DIALYSIS  100 mcg Intravenous Q Tue-HD   feeding supplement (PROSource TF)  45 mL Per Tube TID   guaiFENesin  5 mL Per Tube BID   insulin aspart  0-9 Units Subcutaneous Q4H   insulin glargine-yfgn  30 Units Subcutaneous Daily   lidocaine  1 patch  Transdermal Q24H   mouth rinse  15 mL Mouth Rinse 10 times per day   metaxalone  800 mg Per Tube TID   midodrine  5 mg Per Tube  TID   multivitamin  1 tablet Per Tube QHS   pantoprazole sodium  40 mg Per Tube Daily   sertraline  100 mg Per Tube Daily   simethicone  40 mg Per Tube QID   sodium chloride flush  10-40 mL Intracatheter Q12H   Continuous Infusions:  sodium chloride 10 mL/hr at 10/03/2020 1429   sodium chloride 500 mL (09/01/20 1408)   sodium chloride     sodium chloride     feeding supplement (NEPRO CARB STEADY) 1,000 mL (09/30/20 1656)     LOS: 82 days      Debbe Odea, MD [facility] Pager: [email or web address] 10/01/2020, 12:01 PM

## 2020-10-01 NOTE — Progress Notes (Signed)
[facility] PROGRESS NOTE  Luis Orr GNF:[id number] DOB: 1950-07-04 DOA: 06/20/2020 PCP: Townsend Roger, MD  Status: Remains inpatient appropriate because:Hemodynamically unstable, Persistent severe electrolyte disturbances, Unsafe d/c plan, and Inpatient level of care appropriate due to severity of illness  Dispo: The patient is from: [facility] vent capable              Anticipated d/c is to: SNF Vent capable w/ access to in facility/stretcher based HD-a facility in [place] has been located in preauthorization in progress.              Patient currently is not medically stable to d/c.   Difficult to place patient Yes    Level of care: ICU  Code Status: Partial (no CPR and no defibrillation or cardioversion) Family Communication: Wife June on 8/22.  Of note the wife is also the POA DVT prophylaxis: IV Heparin COVID vaccination status: Unknown    HPI:  70 y.o. 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 Patient presented secondary to altered mental status and hypotension with recurrent pneumonia requiring vasopressor support and ICU admission.he has had multiple recent admissions for sepsis secondary to various infections including UTIs, pneumonias and bacteremia in setting of MDR Klebsiella, MRSA and pseudomonas. Most recent admission September 2021 in setting of sepsis due to MDR Klebsiella with right ureteral calculus s/p double J ureteral stent placement and treated with meropenem x 10days. He was discharged back to [facility].  ICU significant events: 6/29 admitted to ICU for septic shock on levo to maintain MAP>65, continued on vent support 6/29 Blood Cx>> staph species in 1 of 4 cultures drawn>> suspect contaminant 6/30 Off pressors 7/2 ID Consult for MDR acinetobacter in trach asp, Meropenem changed to unasyn 7/2 PRBC transfusion, iron replacement 7/5 Pressors added again. Central line placed.  Drainage from gastrostomy tube >> CT Abd without fluid collection around gastrostomy tube but with diffuse body wall edema. Echo EF 50-55%, mild LVH 7/6 Renal Replacement Therapy ; Code status changed to partial Code (DNR) 7/7 Transfuse 1U PRBC 7/9 add solu cortef 7/10 off pressors, weaning stress steroids 7/11 Pressors added back on again 7/12 ID consult due to rising WBC, hypothermia, hypotension concern for worsening infection. Vancomycin added. Repeat blood cx collected. 7/15 CRRT stopped 7/17 Restarted on low dose levo. No evidence of new infection. Held off on antibiotics. Nephrology contacted family and decision was made to continue iHD this week to allow additional time for renal recovery. Patient is not the best long term iHD candidate but family wants aggressive care 7/18 plans for iHD, tolerated with 2L removed 719 slight drop in hgb to 6.9 will transfuse 1 unit PRBC 7/22 > 7/26: Tolerating HD. TOC consulted for placement. Will likely need out of state placement. 8/15 WBCs greater than 28,000, hypoglycemia, procalcitonin >18, chest x-ray consistent with pneumonia, recent blood cultures consistent with contamination.  Unasyn IV resumed after sputum culture obtained 8/16 consultation placed with ethics committee regarding evaluation for futility of care 8/16 Sputum cx + for Acinetobacter with sensitivities pending 8/17 extensive conversation held with patient's wife regarding patient's poor prognosis and reemergence of Acinetobacter VAP.  Also discussed with her that patient has been able to convey to multiple staff that he does not want to continue on the ventilator or with dialysis.  Wife reports that the patient does not understand what he is saying, she is the POA and that she wants to continue aggressive care. 8/18 patient  now telling nephrology, myself and the rest of the staff he replies yes to wanting to remain on dialysis and on the ventilator and this direct question is  asked. 8/18 Acinetobacter sensitive to Unasyn only.  Discussed with ID/Dr. Juleen China.  Plan is to administer antibiotics for 7-day since patient has not had any increased O2 needs and remains afebrile.  If any concerns over worsening symptoms can give an additional 3 days. Developed moderate bleeding around tracheostomy site on 8/19.  Likely related to combination of Eliquis and heparin required for CRRT.  We will hold Eliquis for now and monitor degree of bleeding Eliquis resumed on 8/22.  Overnight into 8/23 patient bit his tongue and had bleeding.  Also had some bleeding around trach likely from oral bleeding.  Also though did have bleeding from old central line site. 8/23 had transient bleeding around trach and prior central line insertion site overnight.  Patient had bitten tongue and this likely explain the bleeding.  This morning patient was not having any bleeding.  On 8/25 had redeveloped slow ooze bleeding from mouth and trach.  Eliquis discontinued in favor of IV heparin especially given patient will undergo surgical procedure for vascular access on 8/29 8/30 Left FA AVG occluded 9/2 status post thrombectomy and revision of LUE AV graft 9/4 LUE edema with negative venous duplex. 9/5 heparin discontinued and Eliquis resumed    Subjective: Awake.  Makes eye contact but is not eating any attempt to form words with his mouth per his baseline.  On examination he winced with palpation of left side abdomen.  Objective: Vitals:   10/01/20 0600 10/01/20 0700  BP: 129/79 134/74  Pulse:  93  Resp: 20 20  Temp:    SpO2:  100%    Intake/Output Summary (Last 24 hours) at 10/01/2020 0733 Last data filed at 10/01/2020 0700 Gross per 24 hour  Intake 1235 ml  Output 535 ml  Net 700 ml   Filed Weights   09/27/20 2345 09/28/20 0500 10/01/20 0329  Weight: 82.5 kg 81.2 kg 78.8 kg    Exam:  Constitutional: Awake, flat affect, winces with palpation of left side abdomen Respiratory: Trach: 6.0  XLT cuffed, Vent settings: PRVC mode, Fio2 40%, rate 20, VT 510, PEEP 5; Lungs remain coarse bilaterally overall clear.  Tolerating vent settings well.  9/19 attempts made to wean ventilator per PCCM but patient did not tolerate. Cardiovascular: SR w/o ectopy, normotensive, persistent LUE  edema-AVG palpable thrill. Abdomen: LBM 9/19, PEG tube functional.  Left lower quadrant colostomy in place, abdomen distended but nontender with normoactive bowel sounds.  Neurologic: Chronic quadriplegia so unable to independently move extremities.  No sensation below shoulders. 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 -initial respiratory symptoms secondary to combination Acinetobacter pneumonia and volume overload Redeveloped Acinetobacter pneumonia (8/14) and completed 7 days of Unasyn Ventilator management per PCCM  Recurrent ventilator associated pneumonia 2/2 recurrent Acinetobacter/sepsis without shock WBCs stable with positive sputum culture sensitive only to Unasyn ID/Dr Juleen China recommended 7 days of therapy -completed on 8/22   AKI with oliguria/volume overload/ New CKD requiring HD Anuric Nephrology following, initially required CRRT and has transitioned to HD (due to staffing issues continues on CRRT at bedside) LUE AVG functional   Septic shock 2/2 VAP POA Presented with sepsis physiology and profound shock which has now resolved  Chronic thoracic back pain Continue Oxy and fentanyl  prn; Skelaxin and lidocaine patch  History of paroxysmal atrial fibrillation Patient was on amiodarone prior to admission -maintaining SR AVG functional heparin discontinued on 9 5 and Eliquis resumed  TSH was WNL this admission QTc 420 ms on 5/42   Acute metabolic encephalopathy Resolved Secondary to acute illness. CT head negative for acute process.  Back  at baseline.   Diabetes mellitus, type 2 Continue Semglee 30 units daily.  Continue SSI w/ CBG checks every 4 hours  Acute diverticulitis Has completed a course of Flagyl Continues to have residual pain WBC trending down currently 15.3 from a peak of 25.4  Nodular irregular thickening of the ascending colon Documented as stable compared with previous exam on 10/07/2019 Radiologist documented that while this may represent subacute inflammatory change such as diverticulitis and infiltrative neoplasm could appear in this fashion and correlation with endoscopy may be helpful once the patient's acute issues have resolved [facility] consulted.  They have examined the patient and reviewed his chart thoroughly.  Have also spoken with his wife.  Given patient's debilitated state as well as his apparent acute diverticulitis they recommend holding off on colonoscopy for now.  They have signed off for now would recommend calling for any questions.   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  [facility] will not accept him back due to HD dependent and inability to sit up in chair for dialysis treatment   Microcytic anemia/Acute anemia superimposed on anemia of chronic kidney disease S/P 5 units of PRBC  Continue Aranesp and follow Hgb prn   Severe protein calorie malnutrition PEG has been replaced and is functional Discontinue Cortark  Pressure injury/stage IV sacral decubitus Stage IV medial/posterior sacrum present on admission.  Right/posterior/lateral ankle unknown if present on admission.  DTI left ear, not present on admission. (For additional documentation see wound care notes)    Data Reviewed: Basic Metabolic  Panel: Recent Labs  Lab 09/26/20 0952 09/28/20 0549 09/28/20 1315 09/29/20 0406 09/30/20 0457 10/01/20 0325  NA 127* 130* 130* 131* 133* 130*  K 4.2 2.7* 4.9 4.9 3.3* 5.1  CL 88* 89* 93* 93* 94* 94*  CO2 20* $Remov'25 24 24 26 22  'uSVXts$ GLUCOSE 122* 214* 80 125* 124* 146*  BUN 119* 56* 66* 78* 17 59*  CREATININE 1.66* 0.98 1.05 1.24 0.37* 1.00  CALCIUM 9.3 8.7* 8.2* 8.8* 9.0 8.7*  MG  --  2.1  --   --   --  2.1  PHOS 6.7*  --   --   --   --   --    Liver Function Tests: Recent Labs  Lab 09/26/20 0952  ALBUMIN 1.7*    CBC: Recent Labs  Lab 09/25/20 0347 09/28/20 0549 09/29/20 0406 09/30/20 0457 10/01/20 0325  WBC 19.6* 21.1* 19.7* 18.7* 17.4*  HGB 8.0* 9.1* 8.8* 9.7* 7.9*  HCT 24.6* 28.0* 26.9* 29.9* 25.4*  MCV 77.1* 76.7* 76.4* 76.7* 78.6*  PLT 426* 459* 463* 503* 459*   CBG: Recent Labs  Lab 09/30/20 1114 09/30/20 1644 09/30/20 1944 09/30/20 2333 10/01/20 0322  GLUCAP 143* 124* 118* 128* 131*      Scheduled Meds:  amiodarone  200 mg Per Tube Daily   apixaban  5 mg Per Tube BID   buPROPion  75 mg Per Tube BID   chlorhexidine  gluconate (MEDLINE KIT)  15 mL Mouth Rinse BID   Chlorhexidine Gluconate Cloth  6 each Topical Q0600   darbepoetin (ARANESP) injection - DIALYSIS  100 mcg Intravenous Q Tue-HD   feeding supplement (PROSource TF)  45 mL Per Tube TID   guaiFENesin  5 mL Per Tube BID   insulin aspart  0-9 Units Subcutaneous Q4H   insulin glargine-yfgn  30 Units Subcutaneous Daily   lidocaine  1 patch Transdermal Q24H   mouth rinse  15 mL Mouth Rinse 10 times per day   metaxalone  800 mg Per Tube TID   midodrine  5 mg Per Tube TID   multivitamin  1 tablet Per Tube QHS   pantoprazole sodium  40 mg Per Tube Daily   sertraline  100 mg Per Tube Daily   simethicone  40 mg Per Tube QID   sodium chloride flush  10-40 mL Intracatheter Q12H   Continuous Infusions:  sodium chloride 10 mL/hr at 10/04/2020 1429   sodium chloride 500 mL (09/01/20 1408)   sodium  chloride     sodium chloride     feeding supplement (NEPRO CARB STEADY) 1,000 mL (09/30/20 1656)    Principal Problem:   Acute respiratory failure with hypoxia (HCC) Active Problems:   Ventilator dependent (HCC)   Septic shock (HCC)   Pressure injury of skin   AKI (acute kidney injury) ([place])   Hypotension   Goals of care, counseling/discussion   Quadriplegia ([place])   Chronic kidney disease requiring chronic dialysis ([place])   Hyperglycemia   Sepsis due to Acinetobacter baumannii ([place])   Pneumonia due to Acinetobacter species ([place])   Dysautonomia ([place])   Chronic midline thoracic back pain   Abdominal distention   Leukocytosis   Consultants: PCCM Nephrology Infectious disease Palliative care medicine  Procedures: Echocardiogram Insertion of double-lumen HD catheter  Antibiotics: Cefepime x1 dose Meropenem 6/29 through 7/2 Vancomycin 6/29 through 6/30 Unasyn 7/2 through 7/15 Unasyn 8/15 >8/22   Time spent: 35 minutes    Erin Hearing ANP  [facility] 7 am - 330 pm/M-F for direct patient care and secure chat Please refer to Amion for contact info 82  days

## 2020-10-01 NOTE — Progress Notes (Signed)
NAME:  Luis Orr, MRN:  [id number], DOB:  03/03/1950, LOS: 82 ADMISSION DATE:  07/02/2020, CONSULTATION DATE:  06/20/2020 REFERRING MD:  Dr Francia Greaves, EDP CHIEF COMPLAINT:  septic shock    History of Present Illness:  70 yo male from [facility] with hx of C3-C5 compressive myelopathy with functional quadriplegia and chronic trach/vent presented to ED with altered mental status and hypotension with recurrent HCAP.  Has multiple recent admissions for sepsis secondary to various infections including UTIs, pneumonias and bacteremia in setting of MDR Klebsiella, MRSA and pseudomonas. Most recent admission September 2021 in setting of sepsis due to MDR Klebsiella with right ureteral calculus s/p double J ureteral stent placement and treated with meropenem x 10days.  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 Central line placed. Drainage from gastrostomy tube >> CT Abd without fluid collection around gastrostomy tube but with diffuse body wall edema. Echo EF 50-55%, mild LVH  7/6 Renal Replacement Therapy ; Code status changed to partial Code (DNR) 7/7 Transfuse 1U PRBC 7/9 add solu cortef 7/10 off pressors, weaning stress steroids  7/11 Pressors added back on again 7/12 ID consult due to rising WBC, hypothermia, hypotension concern for worsening infection. Vancomycin added. Repeat blood cx collected. 7/15 CRRT stopped   7/17 Restarted on low dose levo. No evidence of new infection. Held off on antibiotics. Nephrology contacted family and decision was made to continue iHD this week to allow additional time for renal recovery. Patient is not a long term iHD candidate  7/18 plans for iHD, tolerated with 2L removed  7/19 slight drop in hgb to 6.9 will transfuse 1 unit PRBC  7/20 To [facility], PCCM following for trach / vent needs 7/22>> tolerating HD, will need out of state placement 8/4 last PCCM visit. 8/4 -8/8: No issues. CM workin gon out of state placement  8/15 no acute changes. Continues on PRVC, was apneic with PSV attempt. Did wean to 40% FiO2  8/29 Oral bleeding from biting lip/tongue, subsequent bloody trach secretions  8/30 Left FA AVG occlueded 9/2 s/p thrombectomy and revision of LUE AVG 9/4 LUE with neg venous dupplex 9/5 heparin stopped, resumed Eliquis  9/6 iHD with 2L UF 9/7 changed from [facility] to PCV given high plat pressures 9/10 remains on the ventilator, unable to wean 9/19 unchanged   Interim History / Subjective:   No acute events overnight.  Has not made much progress on the ventilator.  Continues on hemodialysis. Tried PSV 20/8 with development of tachypnea and Vt fell from low 300s to 200s after 10 minutes.  Objective   Blood pressure 139/86, pulse 88, temperature 98.9 F (37.2 C), temperature source Axillary, resp. rate 18, height '5\' 6"'$  (1.676 m), weight 78.8 kg, SpO2 100 %.    Vent Mode: PCV FiO2 (%):  [40 %] 40 % Set Rate:  [20 bmp] 20 bmp PEEP:  [5 cmH20] 5 cmH20 Plateau Pressure:  [18 cmH20-28 cmH20] 26 cmH20  Intake/Output Summary (Last 24 hours) at 10/01/2020 1225 Last data filed at 10/01/2020 1200 Gross per 24 hour  Intake 1435 ml  Output 535 ml  Net 900 ml    Filed Weights   09/27/20 2345 09/28/20 0500 10/01/20 0329  Weight: 82.5 kg 81.2 kg 78.8 kg    Physical Exam: Gen:      No acute distress, chronically ill-appearing HEENT:  EOMI, sclera anicteric,  tracheostomy Neck:     No masses; no thyromegaly Lungs:    Clear to auscultation bilaterally; normal respiratory effort CV:         Regular rate and rhythm; no murmurs Abd:      + bowel sounds; soft, non-tender; no palpable masses, no distension Ext:    No edema; adequate peripheral perfusion Skin:      Warm and dry; no rash Neuro: Sedated, opens eyes to command  Lab/imaging reviewed Significant for creatinine 1.00, WBC count remains elevated at 15.3 Hemoglobin 7.5  Resolved Hospital Problem list   Hyperkalemia Tachycardia Hypotension Thrombocytosis  AKI Septic shock 2nd to HCAP and UTI all present on admission Acinetobacter in sputum culture from 07/12/20. Completed 14 days of Unasyn for -Of pressors on 4/14. Restarted  levo 7/17. Acute on chronic respiratory failure   Assessment & Plan:   C3-C5 compressive Myelopathy w/ resultant Chronic Respiratory failure, Vent and Trach dependence  Acinetobacter Baumannii PNA Not weaning.  Abx completed 8/22. - continue PCV given high plat's, monitor TVs closely to match his 8cc which is ~510 ml - Continue mucinex and prn glycopyrrolate for secretions - VAP prevention protocol/ PPI - PAD protocol for sedation> minimize as able, prn oxy IR - intermittent CXR  - prn duonebs - continue trach care per protocol - volume removal per iHD  - remainder per primary team/ [facility] - remains limited DNR (drugs and vent only) in the event of decompensation - pt continues to await placement  PCCM will see weekly.  Please call if problems arise sooner.   Lanier Clam, MD [facility] See Amion for pager  If no response to pager , please call [phone] until 7pm After 7:00 pm call Elink  [phone] 10/01/2020, 12:25 PM

## 2020-10-01 NOTE — Progress Notes (Signed)
Hgb 7.5 and HCT 24.1 reported to Dr Wynelle Cleveland.

## 2020-10-01 NOTE — Progress Notes (Signed)
Following to assist with HD arrangements at [facility] when appropriate. Discussed case with SW last week. Will continue to follow and assist as needed.   Melven Sartorius  Renal Navigator [phone]

## 2020-10-01 NOTE — Plan of Care (Signed)
  Problem: Nutrition: Goal: Adequate nutrition will be maintained Outcome: Progressing   Problem: Elimination: Goal: Will not experience complications related to bowel motility Outcome: Progressing   

## 2020-10-01 NOTE — Progress Notes (Signed)
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  Presented from [facility] facility with altered mental status hypotension and pneumonia resultant septic shock is admitted to the intensive care unit.  Has had multiple admissions in the past for Klebsiella, MRSA and Pseudomonas urinary tract infections and pneumonia.  His most recent admission 10/03/2020 with multiple drug-resistant Klebsiella and right ureteral calculus requiring double-J stent.  Renal replacement therapy was initiated 07/18/2020.  He continues to be hemodialysis dependent his last treatment was 09/30/2020.  He had removal of 2 L ultrafiltration.  We will tentatively plan next dialysis treatment 10/02/2020.  Blood pressure 129/79 pulse 95 temperature 98.1 O2 sats 90% FiO2 40%  Sodium 130 potassium 5.1 chloride 94 CO2 22 BUN 39 creatinine 1 glucose 146 calcium 8.7 magnesium 2.1 calcium 8.7 hemoglobin 7.9.  Minimal recorded urine output 9/18 and 10/01/2020   Objective:  Vital signs in last 24 hours:  Temp:  [97.9 F (36.6 C)-99 F (37.2 C)] 98.1 F (36.7 C) (09/19 0340) Pulse Rate:  [87-100] 95 (09/19 0300) Resp:  [18-23] 20 (09/19 0600) BP: (100-155)/(73-93) 129/79 (09/19 0600) SpO2:  [98 %-100 %] 100 % (09/19 0500) FiO2 (%):  [40 %] 40 % (09/19 0320) Weight:  [78.8 kg] 78.8 kg (09/19 0329)  Weight change:  Filed Weights   09/27/20 2345 09/28/20 0500 10/01/20 0329  Weight: 82.5 kg 81.2 kg 78.8 kg    Intake/Output: I/O last 3 completed shifts: In: 1979.3 [I.V.:30; Other:160; NG/GT:1530; IV Piggyback:259.3] Out: 2305 [Urine:30; Other:2000; Stool:275]   Intake/Output this shift:  Total I/O In: 650 [I.V.:10; Other:190; NG/GT:450] Out: 405 [Urine:30; Stool:375]  General:NAD, tracheostomy on vent. Heart:RRR, s1s2 nl Lungs: trach, no increased work of  breathing. Abdomen:soft, Non-tender, PEG tube and ostomy bag Extremities:Trace lower extremities edema Neuro: awake Dialysis Access: LIJ TDC, LUE AVG faint T/B+ (bruit w/ auscultation), swelling of the left upper extremity noted (stable)   Basic Metabolic Panel: Recent Labs  Lab 09/26/20 0952 09/28/20 0549 09/28/20 1315 09/29/20 0406 09/30/20 0457 10/01/20 0325  NA 127* 130* 130* 131* 133* 130*  K 4.2 2.7* 4.9 4.9 3.3* 5.1  CL 88* 89* 93* 93* 94* 94*  CO2 20* _0 GLUCOSE 122* 214* 80 125* 124* 146*  BUN 119* 56* 66* 78* 17 59*  CREATININE 1.66* 0.98 1.05 1.24 0.37* 1.00  CALCIUM 9.3 8.7* 8.2* 8.8* 9.0 8.7*  MG  --  2.1  --   --   --  2.1  PHOS 6.7*  --   --   --   --   --     Liver Function Tests: Recent Labs  Lab 09/26/20 0952  ALBUMIN 1.7*   No results for input(s): LIPASE, AMYLASE in the last 168 hours. No results for input(s): AMMONIA in the last 168 hours.  CBC: Recent Labs  Lab 09/25/20 0347 09/28/20 0549 09/29/20 0406 09/30/20 0457 10/01/20 0325  WBC 19.6* 21.1* 19.7* 18.7* 17.4*  HGB 8.0* 9.1* 8.8* 9.7* 7.9*  HCT 24.6* 28.0* 26.9* 29.9* 25.4*  MCV 77.1* 76.7* 76.4* 76.7* 78.6*  PLT 426* 459* 463* 503* 459*    Cardiac Enzymes: No results for input(s): CKTOTAL, CKMB, CKMBINDEX, TROPONINI in the last 168 hours.  BNP: Invalid input(s): POCBNP  CBG: Recent Labs  Lab 09/30/20 1114 09/30/20 1644 09/30/20 1944 09/30/20 2333 10/01/20 0322  GLUCAP 143* 124* 118* 128* 131*  Microbiology: Results for orders placed or performed during the hospital encounter of 06/20/2020  Urine culture     Status: Abnormal   Collection Time: 06/19/2020  6:24 PM   Specimen: Urine, Random  Result Value Ref Range Status   Specimen Description URINE, RANDOM  Final   Special Requests   Final    NONE Performed at [facility], [place] [address], [place], [place] [address]    Culture MULTIPLE SPECIES PRESENT, SUGGEST RECOLLECTION (A)  Final   Report  Status 07/13/2020 FINAL  Final  Culture, blood (routine x 2)     Status: Abnormal   Collection Time: 07/04/2020  6:42 PM   Specimen: BLOOD LEFT FOREARM  Result Value Ref Range Status   Specimen Description BLOOD LEFT FOREARM  Final   Special Requests   Final    BOTTLES DRAWN AEROBIC AND ANAEROBIC Blood Culture results may not be optimal due to an inadequate volume of blood received in culture bottles   Culture  Setup Time   Final    ANAEROBIC BOTTLE ONLY GRAM POSITIVE COCCI CRITICAL RESULT CALLED TO, READ BACK BY AND VERIFIED WITH: G ABBOTT PHARMD 07/13/20 0207 JDW    Culture (A)  Final    STAPHYLOCOCCUS AURICULARIS THE SIGNIFICANCE OF ISOLATING THIS ORGANISM FROM A SINGLE SET OF BLOOD CULTURES WHEN MULTIPLE SETS ARE DRAWN IS UNCERTAIN. PLEASE NOTIFY THE MICROBIOLOGY DEPARTMENT WITHIN ONE WEEK IF SPECIATION AND SENSITIVITIES ARE REQUIRED. Performed at [facility], [place] [address] [place], [place] [address]    Report Status 07/14/2020 FINAL  Final  Blood Culture ID Panel (Reflexed)     Status: Abnormal   Collection Time: 06/22/2020  6:42 PM  Result Value Ref Range Status   Enterococcus faecalis NOT DETECTED NOT DETECTED Final   Enterococcus Faecium NOT DETECTED NOT DETECTED Final   Listeria monocytogenes NOT DETECTED NOT DETECTED Final   Staphylococcus species DETECTED (A) NOT DETECTED Final    Comment: CRITICAL RESULT CALLED TO, READ BACK BY AND VERIFIED WITH: G ABBOTT PHARMD 07/13/20 0207 JDW    Staphylococcus aureus (BCID) NOT DETECTED NOT DETECTED Final   Staphylococcus epidermidis NOT DETECTED NOT DETECTED Final   Staphylococcus lugdunensis NOT DETECTED NOT DETECTED Final   Streptococcus species NOT DETECTED NOT DETECTED Final   Streptococcus agalactiae NOT DETECTED NOT DETECTED Final   Streptococcus pneumoniae NOT DETECTED NOT DETECTED Final   Streptococcus pyogenes NOT DETECTED NOT DETECTED Final   A.calcoaceticus-baumannii NOT DETECTED NOT DETECTED Final   Bacteroides  fragilis NOT DETECTED NOT DETECTED Final   Enterobacterales NOT DETECTED NOT DETECTED Final   Enterobacter cloacae complex NOT DETECTED NOT DETECTED Final   Escherichia coli NOT DETECTED NOT DETECTED Final   Klebsiella aerogenes NOT DETECTED NOT DETECTED Final   Klebsiella oxytoca NOT DETECTED NOT DETECTED Final   Klebsiella pneumoniae NOT DETECTED NOT DETECTED Final   Proteus species NOT DETECTED NOT DETECTED Final   Salmonella species NOT DETECTED NOT DETECTED Final   Serratia marcescens NOT DETECTED NOT DETECTED Final   Haemophilus influenzae NOT DETECTED NOT DETECTED Final   Neisseria meningitidis NOT DETECTED NOT DETECTED Final   Pseudomonas aeruginosa NOT DETECTED NOT DETECTED Final   Stenotrophomonas maltophilia NOT DETECTED NOT DETECTED Final   Candida albicans NOT DETECTED NOT DETECTED Final   Candida auris NOT DETECTED NOT DETECTED Final   Candida glabrata NOT DETECTED NOT DETECTED Final   Candida krusei NOT DETECTED NOT DETECTED Final   Candida parapsilosis NOT DETECTED NOT DETECTED Final   Candida  tropicalis NOT DETECTED NOT DETECTED Final   Cryptococcus neoformans/gattii NOT DETECTED NOT DETECTED Final    Comment: Performed at [facility], [place] [address], [place], [place] [address]  Resp Panel by RT-PCR (Flu A&B, Covid) Nasopharyngeal Swab     Status: None   Collection Time: 07/01/2020  7:22 PM   Specimen: Nasopharyngeal Swab; Nasopharyngeal(NP) swabs in vial transport medium  Result Value Ref Range Status   SARS Coronavirus 2 by RT PCR NEGATIVE NEGATIVE Final    Comment: (NOTE) SARS-CoV-2 target nucleic acids are NOT DETECTED.  The SARS-CoV-2 RNA is generally detectable in upper respiratory specimens during the acute phase of infection. The lowest concentration of SARS-CoV-2 viral copies this assay can detect is 138 copies/mL. A negative result does not preclude SARS-Cov-2 infection and should not be used as the sole basis for treatment or other patient  management decisions. A negative result may occur with  improper specimen collection/handling, submission of specimen other than nasopharyngeal swab, presence of viral mutation(s) within the areas targeted by this assay, and inadequate number of viral copies(<138 copies/mL). A negative result must be combined with clinical observations, patient history, and epidemiological information. The expected result is Negative.  Fact Sheet for Patients:  EntrepreneurPulse.com.au  Fact Sheet for Healthcare Providers:  IncredibleEmployment.be  This test is no t yet approved or cleared by the [place] FDA and  has been authorized for detection and/or diagnosis of SARS-CoV-2 by FDA under an Emergency Use Authorization (EUA). This EUA will remain  in effect (meaning this test can be used) for the duration of the COVID-19 declaration under Section 564(b)(1) of the Act, 21 U.S.C.section 360bbb-3(b)(1), unless the authorization is terminated  or revoked sooner.       Influenza A by PCR NEGATIVE NEGATIVE Final   Influenza B by PCR NEGATIVE NEGATIVE Final    Comment: (NOTE) The Xpert Xpress SARS-CoV-2/FLU/RSV plus assay is intended as an aid in the diagnosis of influenza from Nasopharyngeal swab specimens and should not be used as a sole basis for treatment. Nasal washings and aspirates are unacceptable for Xpert Xpress SARS-CoV-2/FLU/RSV testing.  Fact Sheet for Patients: EntrepreneurPulse.com.au  Fact Sheet for Healthcare Providers: IncredibleEmployment.be  This test is not yet approved or cleared by the [place] FDA and has been authorized for detection and/or diagnosis of SARS-CoV-2 by FDA under an Emergency Use Authorization (EUA). This EUA will remain in effect (meaning this test can be used) for the duration of the COVID-19 declaration under Section 564(b)(1) of the Act, 21 U.S.C. section 360bbb-3(b)(1),  unless the authorization is terminated or revoked.  Performed at [facility], [place] [address], [place], [place] [address]   Culture, blood (routine x 2)     Status: None   Collection Time: 06/24/2020  7:23 PM   Specimen: BLOOD  Result Value Ref Range Status   Specimen Description BLOOD RIGHT ANTECUBITAL  Final   Special Requests   Final    BOTTLES DRAWN AEROBIC AND ANAEROBIC Blood Culture adequate volume   Culture   Final    NO GROWTH 5 DAYS Performed at [facility], [place] [address], [place], [place] [address]    Report Status 07/16/2020 FINAL  Final  MRSA Next Gen by PCR, Nasal     Status: Abnormal   Collection Time: 07/08/2020 10:29 PM   Specimen: Nasal Mucosa; Nasal Swab  Result Value Ref Range Status   MRSA by PCR Next Gen DETECTED (A) NOT DETECTED Final  Comment: RESULT CALLED TO, READ BACK BY AND VERIFIED WITH: Ayesha Rumpf RN 07/12/20 0428 JDW (NOTE) The GeneXpert MRSA Assay (FDA approved for NASAL specimens only), is one component of a comprehensive MRSA colonization surveillance program. It is not intended to diagnose MRSA infection nor to guide or monitor treatment for MRSA infections. Test performance is not FDA approved in patients less than 95 years old. Performed at [facility], [place] [address] [place], [place] [address]   Culture, Respiratory w Gram Stain     Status: None   Collection Time: 07/12/20  3:48 PM   Specimen: Tracheal Aspirate; Respiratory  Result Value Ref Range Status   Specimen Description TRACHEAL ASPIRATE  Final   Special Requests NONE  Final   Gram Stain   Final    MODERATE WBC PRESENT, PREDOMINANTLY PMN FEW GRAM NEGATIVE RODS FEW GRAM POSITIVE COCCI IN PAIRS    Culture   Final    MODERATE ACINETOBACTER BAUMANNII MULTI-DRUG RESISTANT ORGANISM RESULT CALLED TO, READ BACK BY AND VERIFIED WITH: RN J.BEABRAUT ON [id number] AT 1410 BY E.PARRISH Performed at [facility], [place] [address] [place], [place] [address]    Report  Status 07/14/2020 FINAL  Final   Organism ID, Bacteria ACINETOBACTER BAUMANNII  Final      Susceptibility   Acinetobacter baumannii - MIC*    CEFTAZIDIME 32 RESISTANT Resistant     CIPROFLOXACIN >=4 RESISTANT Resistant     GENTAMICIN >=16 RESISTANT Resistant     IMIPENEM >=16 RESISTANT Resistant     PIP/TAZO >=128 RESISTANT Resistant     TRIMETH/SULFA >=320 RESISTANT Resistant     AMPICILLIN/SULBACTAM 8 SENSITIVE Sensitive     * MODERATE ACINETOBACTER BAUMANNII  Culture, Urine     Status: Abnormal   Collection Time: 07/13/20 11:26 AM   Specimen: Urine, Random  Result Value Ref Range Status   Specimen Description URINE, RANDOM  Final   Special Requests   Final    NONE Performed at [facility], [place] [address] [place], [place] [address]    Culture MULTIPLE SPECIES PRESENT, SUGGEST RECOLLECTION (A)  Final   Report Status 07/14/2020 FINAL  Final  Culture, blood (routine x 2)     Status: None   Collection Time: 07/24/20  3:53 PM   Specimen: BLOOD RIGHT HAND  Result Value Ref Range Status   Specimen Description BLOOD RIGHT HAND  Final   Special Requests   Final    BOTTLES DRAWN AEROBIC AND ANAEROBIC Blood Culture results may not be optimal due to an inadequate volume of blood received in culture bottles   Culture   Final    NO GROWTH 5 DAYS Performed at [facility], [place] [address] [place], [place] [address]    Report Status 07/29/2020 FINAL  Final  Culture, blood (routine x 2)     Status: None   Collection Time: 07/24/20  4:10 PM   Specimen: BLOOD LEFT HAND  Result Value Ref Range Status   Specimen Description BLOOD LEFT HAND  Final   Special Requests   Final    BOTTLES DRAWN AEROBIC AND ANAEROBIC Blood Culture adequate volume   Culture   Final    NO GROWTH 5 DAYS Performed at [facility], [place] [address], [place], [place] [address]    Report Status 07/29/2020 FINAL  Final  MRSA Next Gen by PCR, Nasal     Status: None   Collection Time: 08/23/20  4:17 PM  Specimen: Nasal Mucosa; Nasal Swab  Result Value Ref Range Status   MRSA by PCR Next Gen NOT DETECTED NOT DETECTED Final    Comment: (NOTE) The GeneXpert MRSA Assay (FDA approved for NASAL specimens only), is one component of a comprehensive MRSA colonization surveillance program. It is not intended to diagnose MRSA infection nor to guide or monitor treatment for MRSA infections. Test performance is not FDA approved in patients less than 58 years old. Performed at [facility], [place] [address], [place], [place] [address]   Culture, blood (routine x 2)     Status: None   Collection Time: 08/26/20  9:32 AM   Specimen: BLOOD RIGHT HAND  Result Value Ref Range Status   Specimen Description BLOOD RIGHT HAND  Final   Special Requests   Final    BOTTLES DRAWN AEROBIC ONLY Blood Culture results may not be optimal due to an inadequate volume of blood received in culture bottles   Culture   Final    NO GROWTH 5 DAYS Performed at [facility], [place] [address], [place], [place] [address]    Report Status 08/31/2020 FINAL  Final  Culture, blood (routine x 2)     Status: Abnormal   Collection Time: 08/26/20  9:38 AM   Specimen: BLOOD LEFT HAND  Result Value Ref Range Status   Specimen Description BLOOD LEFT HAND  Final   Special Requests   Final    BOTTLES DRAWN AEROBIC AND ANAEROBIC Blood Culture adequate volume   Culture  Setup Time   Final    GRAM POSITIVE COCCI IN CLUSTERS AEROBIC BOTTLE ONLY CRITICAL RESULT CALLED TO, READ BACK BY AND VERIFIED WITH: PHARM D A.LAWLESS ON [id number] AT 0945 BY E.PARRISH    Culture (A)  Final    STAPHYLOCOCCUS EPIDERMIDIS THE SIGNIFICANCE OF ISOLATING THIS ORGANISM FROM A SINGLE SET OF BLOOD CULTURES WHEN MULTIPLE SETS ARE DRAWN IS UNCERTAIN. PLEASE NOTIFY THE MICROBIOLOGY DEPARTMENT WITHIN ONE WEEK IF SPECIATION AND SENSITIVITIES ARE REQUIRED. Performed at [facility], [place] [address] [place], [place] [address]    Report Status  08/29/2020 FINAL  Final  Culture, Respiratory w Gram Stain     Status: None   Collection Time: 08/27/20  9:15 AM   Specimen: Tracheal Aspirate; Respiratory  Result Value Ref Range Status   Specimen Description TRACHEAL ASPIRATE  Final   Special Requests NONE  Final   Gram Stain   Final    ABUNDANT WBC PRESENT, PREDOMINANTLY PMN FEW GRAM POSITIVE COCCI FEW GRAM NEGATIVE RODS RARE GRAM POSITIVE RODS    Culture   Final    ABUNDANT ACINETOBACTER BAUMANNII MULTI-DRUG RESISTANT ORGANISM CRITICAL RESULT CALLED TO, READ BACK BY AND VERIFIED WITH: PHARMD K MCCLURE [id number] AT 1101 AM BY CM Performed at [facility], [place] [address] [place], [place] [address]    Report Status 08/30/2020 FINAL  Final   Organism ID, Bacteria ACINETOBACTER BAUMANNII  Final      Susceptibility   Acinetobacter baumannii - MIC*    CEFTAZIDIME 16 INTERMEDIATE Intermediate     CIPROFLOXACIN >=4 RESISTANT Resistant     GENTAMICIN >=16 RESISTANT Resistant     IMIPENEM >=16 RESISTANT Resistant     PIP/TAZO >=128 RESISTANT Resistant     TRIMETH/SULFA >=320 RESISTANT Resistant     AMPICILLIN/SULBACTAM 4 SENSITIVE Sensitive     * ABUNDANT ACINETOBACTER BAUMANNII  Surgical PCR screen     Status: None   Collection Time: 09/17/2020  9:25  AM   Specimen: Nasal Mucosa; Nasal Swab  Result Value Ref Range Status   MRSA, PCR NEGATIVE NEGATIVE Final   Staphylococcus aureus NEGATIVE NEGATIVE Final    Comment: (NOTE) The Xpert SA Assay (FDA approved for NASAL specimens in patients 79 years of age and older), is one component of a comprehensive surveillance program. It is not intended to diagnose infection nor to guide or monitor treatment. Performed at [facility], [place] [address] [place],  [address]     Coagulation Studies: No results for input(s): LABPROT, INR in the last 72 hours.  Urinalysis: No results for input(s): COLORURINE, LABSPEC, PHURINE, GLUCOSEU, HGBUR, BILIRUBINUR, KETONESUR, PROTEINUR,  UROBILINOGEN, NITRITE, LEUKOCYTESUR in the last 72 hours.  Invalid input(s): APPERANCEUR    Imaging: No results found.   Medications:    sodium chloride 10 mL/hr at 10/12/2020 1429   sodium chloride 500 mL (09/01/20 1408)   sodium chloride     sodium chloride     feeding supplement (NEPRO CARB STEADY) 1,000 mL (09/30/20 1656)    amiodarone  200 mg Per Tube Daily   apixaban  5 mg Per Tube BID   buPROPion  75 mg Per Tube BID   chlorhexidine gluconate (MEDLINE KIT)  15 mL Mouth Rinse BID   Chlorhexidine Gluconate Cloth  6 each Topical Daily   darbepoetin (ARANESP) injection - DIALYSIS  100 mcg Intravenous Q Tue-HD   feeding supplement (PROSource TF)  45 mL Per Tube TID   guaiFENesin  5 mL Per Tube BID   insulin aspart  0-9 Units Subcutaneous Q4H   insulin glargine-yfgn  30 Units Subcutaneous Daily   lidocaine  1 patch Transdermal Q24H   mouth rinse  15 mL Mouth Rinse 10 times per day   metaxalone  800 mg Per Tube TID   midodrine  5 mg Per Tube TID   multivitamin  1 tablet Per Tube QHS   pantoprazole sodium  40 mg Per Tube Daily   sertraline  100 mg Per Tube Daily   simethicone  40 mg Per Tube QID   sodium chloride flush  10-40 mL Intracatheter Q12H   sodium chloride, sodium chloride, sodium chloride, alteplase, glycopyrrolate, heparin, heparin, heparin sodium (porcine), ipratropium-albuterol, lidocaine (PF), lidocaine (PF), lidocaine-prilocaine, morphine injection, oxyCODONE, pentafluoroprop-tetrafluoroeth, phenol, polyethylene glycol, sodium chloride flush  Assessment/ Plan:  1.Acute kidney Injury-prolonged dialysis dependency, now progressed to ESRD:  ATN from septic shock-without any evidence of renal recovery to date . initiated on CRRT 07/18/20 and transitioned to IHD.  Has [facility] & AVG, continue with HD on TTS schedule for now.  -Unable to sit on chair or recliner.   2 Vascular access - had left forearm AVG placed 08/23/2020 but unfortunately had clotted.  s/p thrombectomy and  revision 9/2, graft can be used 4 weeks thereafter.  Swelling of left upper extremity noted continue arm elevation and supportive care.  Continue to follow  3 Septic shock/ventilator associated pneumonia due to recurrent Acinetobacter infection: Off antibiotics and stable hemodynamics.   4 Leukocytosis -persistent, will defer to primary service for further mgmt   5 Chronic respiratory failure: Status post tracheostomy with ventilator management per CCM.   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   7 Chronic kidney disease/metabolic bone disease: Calcium and phosphorus level under control with hemodialysis, off binders   8 Anemia: contiue ESA, monitor Hb.  Transfuse as needed.  9 Acute metabolic encephalopathy: Continue management as above.,  Supportive care.   10  Disposition - pt cannot return to [facility] now that he is ESRD. He is going to [facility] in [place], [place]. He needs to have [place] Medicaid & his AVG needs to be mature prior to being accepted. Other possible barrier is pt is a permanent trach & vent pt.    LOS: [place] _0 _1 :14 AM

## 2020-10-01 NOTE — Progress Notes (Signed)
Video connection link was texted to participant via mobile number provided. Connection was successful.

## 2020-10-02 DIAGNOSIS — E876 Hypokalemia: Secondary | ICD-10-CM | POA: Diagnosis not present

## 2020-10-02 DIAGNOSIS — N186 End stage renal disease: Secondary | ICD-10-CM | POA: Diagnosis not present

## 2020-10-02 DIAGNOSIS — J9601 Acute respiratory failure with hypoxia: Secondary | ICD-10-CM | POA: Diagnosis not present

## 2020-10-02 DIAGNOSIS — R1310 Dysphagia, unspecified: Secondary | ICD-10-CM | POA: Diagnosis not present

## 2020-10-02 LAB — CBC
HCT: 26 % — ABNORMAL LOW (ref 39.0–52.0)
Hemoglobin: 8.3 g/dL — ABNORMAL LOW (ref 13.0–17.0)
MCH: 25 pg — ABNORMAL LOW (ref 26.0–34.0)
MCHC: 31.9 g/dL (ref 30.0–36.0)
MCV: 78.3 fL — ABNORMAL LOW (ref 80.0–100.0)
Platelets: 450 K/uL — ABNORMAL HIGH (ref 150–400)
RBC: 3.32 MIL/uL — ABNORMAL LOW (ref 4.22–5.81)
RDW: 19.8 % — ABNORMAL HIGH (ref 11.5–15.5)
WBC: 21.9 K/uL — ABNORMAL HIGH (ref 4.0–10.5)
nRBC: 0.2 % (ref 0.0–0.2)

## 2020-10-02 LAB — RENAL FUNCTION PANEL
Albumin: 1.7 g/dL — ABNORMAL LOW (ref 3.5–5.0)
Anion gap: 16 — ABNORMAL HIGH (ref 5–15)
BUN: 93 mg/dL — ABNORMAL HIGH (ref 8–23)
CO2: 21 mmol/L — ABNORMAL LOW (ref 22–32)
Calcium: 9.2 mg/dL (ref 8.9–10.3)
Chloride: 92 mmol/L — ABNORMAL LOW (ref 98–111)
Creatinine, Ser: 1.39 mg/dL — ABNORMAL HIGH (ref 0.61–1.24)
GFR, Estimated: 55 mL/min — ABNORMAL LOW
Glucose, Bld: 185 mg/dL — ABNORMAL HIGH (ref 70–99)
Phosphorus: 7.3 mg/dL — ABNORMAL HIGH (ref 2.5–4.6)
Potassium: 5.5 mmol/L — ABNORMAL HIGH (ref 3.5–5.1)
Sodium: 129 mmol/L — ABNORMAL LOW (ref 135–145)

## 2020-10-02 LAB — GLUCOSE, CAPILLARY
Glucose-Capillary: 114 mg/dL — ABNORMAL HIGH (ref 70–99)
Glucose-Capillary: 134 mg/dL — ABNORMAL HIGH (ref 70–99)
Glucose-Capillary: 145 mg/dL — ABNORMAL HIGH (ref 70–99)
Glucose-Capillary: 161 mg/dL — ABNORMAL HIGH (ref 70–99)
Glucose-Capillary: 179 mg/dL — ABNORMAL HIGH (ref 70–99)
Glucose-Capillary: 186 mg/dL — ABNORMAL HIGH (ref 70–99)

## 2020-10-02 MED ORDER — LIDOCAINE HCL (PF) 1 % IJ SOLN: 5.0000 mL | INTRAMUSCULAR | Status: DC | PRN

## 2020-10-02 MED ORDER — SODIUM CHLORIDE 0.9 % IV SOLN: 100.0000 mL | INTRAVENOUS | Status: DC | PRN

## 2020-10-02 MED ORDER — PENTAFLUOROPROP-TETRAFLUOROETH EX AERO: 1.0000 "application " | INHALATION_SPRAY | CUTANEOUS | Status: DC | PRN

## 2020-10-02 MED ORDER — ALTEPLASE 2 MG IJ SOLR
2.0000 mg | Freq: Once | INTRAMUSCULAR | Status: DC | PRN
  Filled 2020-10-02: qty 2

## 2020-10-02 MED ORDER — HEPARIN SODIUM (PORCINE) 1000 UNIT/ML DIALYSIS: 1000.0000 [IU] | INTRAMUSCULAR | Status: DC | PRN

## 2020-10-02 MED ORDER — LIDOCAINE-PRILOCAINE 2.5-2.5 % EX CREA
1.0000 "application " | TOPICAL_CREAM | CUTANEOUS | Status: DC | PRN
  Filled 2020-10-02: qty 5

## 2020-10-02 NOTE — Progress Notes (Addendum)
PROGRESS NOTE    Luis Orr   IEP:[id number]  DOB: [birth year]  DOA: 07/06/2020 PCP: Townsend Roger, MD   Brief Narrative:  Luis Orr is a 70 year old male with C3-C5 compressive myelopathy and subsequent quadriplegia who is chronically on a trach and vent.  He presented from [facility] facility with altered mental status hypotension and pneumonia with resultant septic shock and was admitted to the ICU.  He is noted to have multiple admissions for infections including bacteremia, UTI and pneumonia and has grown out multidrug-resistant Klebsiella, MRSA and Pseudomonas.  His most recent admission was in 9/21 due to multidrug-resistant Klebsiella, right ureteral calculus requiring a double-J stent and was treated with meropenem.  He was discharged back to [facility].  The patient was admitted to the ICU on 6/29 and treated for septic shock. 7/2 ID Consult for MDR acinetobacter in trach asp, Meropenem changed to unasyn 7/2 PRBC transfusion, iron replacement 7/5 Pressors added again. Central line placed. Drainage from gastrostomy tube >> CT Abd without fluid collection around gastrostomy tube but with diffuse body wall edema. Echo EF 50-55%, mild LVH 7/6 Renal Replacement Therapy ; Code status changed to partial Code (DNR) 7/7 Transfuse 1U PRBC 7/9 add solu cortef 7/10 off pressors, weaning stress steroids 7/11 Pressors added back on again 7/12 ID consult due to rising WBC, hypothermia, hypotension concern for worsening infection. Vancomycin added. Repeat blood cx collected. 7/15 CRRT stopped 7/17 Restarted on low dose levo. No evidence of new infection. Held off on antibiotics. Nephrology contacted family and decision was made to continue iHD this week to allow additional time for renal recovery. Patient is not the best long term iHD candidate but family wants aggressive care 7/18 plans for iHD, tolerated with 2L removed 719 slight drop in hgb to 6.9 will transfuse 1 unit  PRBC 7/22 > 7/26: Tolerating HD. TOC consulted for placement. Will likely need out of state placement. 8/15 WBCs greater than 28,000, hypoglycemia, procalcitonin >18, chest x-ray consistent with pneumonia, recent blood cultures consistent with contamination.  Unasyn IV resumed after sputum culture obtained 8/16 consultation placed with ethics committee regarding evaluation for futility of care 8/16 Sputum cx + for Acinetobacter with sensitivities pending 8/17 extensive conversation held with patient's wife regarding patient's poor prognosis and reemergence of Acinetobacter VAP.  Also discussed with her that patient has been able to convey to multiple staff that he does not want to continue on the ventilator or with dialysis.  Wife reports that the patient does not understand what he is saying, she is the POA and that she wants to continue aggressive care. 8/18 patient now telling nephrology, myself and the rest of the staff he replies yes to wanting to remain on dialysis and on the ventilator and this direct question is asked. 8/18 Acinetobacter sensitive to Unasyn only.  Discussed with ID/Dr. Juleen China.  Plan is to administer antibiotics for 7-day since patient has not had any increased O2 needs and remains afebrile.  If any concerns over worsening symptoms can give an additional 3 days. Developed moderate bleeding around tracheostomy site on 8/19.  Likely related to combination of Eliquis and heparin required for CRRT.  We will hold Eliquis for now and monitor degree of bleeding Eliquis resumed on 8/22.  Overnight into 8/23 patient bit his tongue and had bleeding.  Also had some bleeding around trach likely from oral bleeding.  Also though did have bleeding from old central line site. 8/23 had transient bleeding around trach and prior  central line insertion site overnight.  Patient had bitten tongue and this likely explain the bleeding.  This morning patient was not having any bleeding.  On 8/25 had  redeveloped slow ooze bleeding from mouth and trach.  Eliquis discontinued in favor of IV heparin especially given patient will undergo surgical procedure for vascular access on 8/29 8/30 Left FA AVG occluded 9/2 status post thrombectomy and revision of LUE AV graft 9/4 LUE edema with negative venous duplex. 9/5 heparin discontinued and Eliquis resumed     Subjective: He has no complaints/ non verbal with me.     Assessment & Plan:   Principal Problem:   Acute respiratory failure with hypoxia superimposed on chronic respiratory failure Recurrent ventilator associated pneumonias Septic shock -Developed Acinetobacter pneumonia on 8/14 and completed 7 days of Unasyn -Remains on the ventilator and is stable at this time -Appreciate PCCM managing ventilator  Active Problems:  PEG tube complication - 6/29> PEG came out while RNs were doing wound care -  PEG was replaced same day by IR - cont tube feeds and meds via PEG  Hypokalemia - K 2.7 on 9/16- replacing and checking intermittently - attempt to correct with dialysis  Mild hyponatremia - stable- follow intermittently- correct with dialysis   Diarrhea - 9/16> > 800 cc of stool in 24 hr period per documentation -  stool studies negative - stool output has decreased and is now within normal limits  AKI with oliguria New end-stage renal disease - initially suspected to be ATN related to septic shock -Unfortunately, renal recovery has not occurred -Imaging revealed marked atrophy of the right kidney- Double-J ureteral stent is still present - Appreciate nephrology management of this-she continues to require dialysis via HD catheter on Tuesday Thursday Saturdays - Has left arm AV graft that was placed on 8/29 however, this has clotted-he had a thrombectomy and revision on 9/2 - Graft will need to mature for 4 weeks after revision before it can be utilized - Midodrine being used for hypotension and tachycardia  Large stage 4  sacral decubitus ulcer eroding into the sacrum- (POA) Stage 2 pressure ulcer right heel - CT abdomen and pelvis performed on the 12th for further work-up of leukocytosis reveals that the sacral decubitus ulcer is eroding into the distal sacrum with gas tracking into the right gluteus maximus muscle-no fluid noted - 9/15> blood noted on dressing - there was no pus, but foul odor noted- I asked General surgery for an opinion on wound care on 9/15- they debrided the wound at bedside-  no further bleeding noted- no pus noted- minimal discharge at this time  Persistently elevated WBC count - WBC count has been  continuously elevated but fluctuates and has been as high as the 30s - WBC steadily improving after debridement of necrotic tissue was performed  Hartmann pouch with colostomy -Of note, recent CT scan showed > nodular irregular thickening of the ascending colon, stable since immediate prior examinationsGI was consulted-at this point, GI does not recommend a colonoscopy   - dc'd antibiotics -9/14-  following - noted to be stable - high risk for C diff with recurrent antibiotics  Insulin-dependent diabetes mellitus -Semglee and ISS being used Hemoglobin A1C    Component Value Date/Time   HGBA1C 6.3 (H) 06/19/2020 2143   Microcytic anemia - ferritin 282- likely elevated (acute phase reactant) in setting of multiple infections and acute stress - iron level 27 and saturation 12 - replacement by nephrology  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 PEG tube- severe protein calorie malnutrition -Continue tube feeds and supportive care -Receiving Skelaxin  Left upper arm edema - Related to above-mentioned clotted graft which is status post revision  A-fib, paroxysmal - cont Amiodarone and Eliquis  Time spent in minutes:  30 DVT prophylaxis: Place and maintain sequential compression device Start: 07/31/20 1614 apixaban (ELIQUIS) tablet 5 mg  Code Status: Partial- No CPR-  give ACLS meds Family Communication:  Level of Care: Level of care: ICU Disposition Plan:  Status is: Inpatient  Remains inpatient appropriate because:Unsafe d/c plan  Dispo: The patient is from:  [facility]              Anticipated d/c is to:  SNF              Patient currently is not medically stable to d/c.   Difficult to place patient yes  Consultants:  Pulmonary critical care Nephrology Infectious disease Palliative medicine  Procedures:  HD catheter AV fistula with declotting and revision   Objective: Vitals:   10/02/20 1051 10/02/20 1100 10/02/20 1104 10/02/20 1115  BP:  (!) 174/109    Pulse: (!) 102   (!) 101  Resp: 20   20  Temp:   (!) 97.4 F (36.3 C)   TempSrc:   Oral   SpO2: 94%   95%  Weight:      Height:        Intake/Output Summary (Last 24 hours) at 10/02/2020 1240 Last data filed at 10/02/2020 1100 Gross per 24 hour  Intake 1395 ml  Output 145 ml  Net 1250 ml    Filed Weights   09/28/20 0500 10/01/20 0329 10/02/20 0500  Weight: 81.2 kg 78.8 kg 80.7 kg    Examination: General exam: Appears comfortable  HEENT: PERRL,no sclera icterus or thrush- trach present and on ventilator Respiratory system: Clear to auscultation. Respiratory effort normal. Cardiovascular system: S1 & S2 heard, regular rate and rhythm Gastrointestinal system: Abdomen soft, non-tender, nondistended. Normal bowel sounds  - PEG tube and colostomy intact Central nervous system: Alert on ventilator- quadriplegic Extremities: No cyanosis, clubbing or edema Skin:  see below pictures             Data Reviewed: I have personally reviewed following labs and imaging studies  CBC: Recent Labs  Lab 09/28/20 0549 09/29/20 0406 09/30/20 0457 10/01/20 0325 10/01/20 1035  WBC 21.1* 19.7* 18.7* 17.4* 15.3*  HGB 9.1* 8.8* 9.7* 7.9* 7.5*  HCT 28.0* 26.9* 29.9* 25.4* 24.1*  MCV 76.7* 76.4* 76.7* 78.6* 78.5*  PLT 459* 463* 503* 459* 431*    Basic Metabolic  Panel: Recent Labs  Lab 09/26/20 0952 09/28/20 0549 09/28/20 1315 09/29/20 0406 09/30/20 0457 10/01/20 0325  NA 127* 130* 130* 131* 133* 130*  K 4.2 2.7* 4.9 4.9 3.3* 5.1  CL 88* 89* 93* 93* 94* 94*  CO2 20* 25 24 24 26 22   GLUCOSE 122* 214* 80 125* 124* 146*  BUN 119* 56* 66* 78* 17 59*  CREATININE 1.66* 0.98 1.05 1.24 0.37* 1.00  CALCIUM 9.3 8.7* 8.2* 8.8* 9.0 8.7*  MG  --  2.1  --   --   --  2.1  PHOS 6.7*  --   --   --   --   --     GFR: Estimated Creatinine Clearance: 68.6 mL/min (by C-G formula based on SCr of 1 mg/dL). Liver Function Tests: Recent Labs  Lab 09/26/20 0952  ALBUMIN 1.7*    No results for  input(s): LIPASE, AMYLASE in the last 168 hours. No results for input(s): AMMONIA in the last 168 hours. Coagulation Profile: No results for input(s): INR, PROTIME in the last 168 hours. Cardiac Enzymes: No results for input(s): CKTOTAL, CKMB, CKMBINDEX, TROPONINI in the last 168 hours. BNP (last 3 results) No results for input(s): PROBNP in the last 8760 hours. HbA1C: No results for input(s): HGBA1C in the last 72 hours. CBG: Recent Labs  Lab 10/01/20 2007 10/01/20 2354 10/02/20 0322 10/02/20 0813 10/02/20 1103  GLUCAP 116* 120* 114* 145* 161*    Lipid Profile: No results for input(s): CHOL, HDL, LDLCALC, TRIG, CHOLHDL, LDLDIRECT in the last 72 hours. Thyroid Function Tests: No results for input(s): TSH, T4TOTAL, FREET4, T3FREE, THYROIDAB in the last 72 hours. Anemia Panel: No results for input(s): VITAMINB12, FOLATE, FERRITIN, TIBC, IRON, RETICCTPCT in the last 72 hours. Urine analysis:    Component Value Date/Time   COLORURINE YELLOW 07/25/2020 [place] 07/25/2020 0334   LABSPEC 1.020 07/25/2020 0334   PHURINE 6.5 07/25/2020 0334   GLUCOSEU 100 (A) 07/25/2020 0334   HGBUR LARGE (A) 07/25/2020 0334   BILIRUBINUR SMALL (A) 07/25/2020 0334   KETONESUR NEGATIVE 07/25/2020 0334   PROTEINUR >300 (A) 07/25/2020 0334   NITRITE  NEGATIVE 07/25/2020 0334   LEUKOCYTESUR MODERATE (A) 07/25/2020 0334   Sepsis Labs: @LABRCNTIP (procalcitonin:4,lacticidven:4) ) Recent Results (from the past 240 hour(s))  Gastrointestinal Panel by PCR , Stool     Status: None   Collection Time: 09/28/20  1:16 PM   Specimen: Stool  Result Value Ref Range Status   Campylobacter species NOT DETECTED NOT DETECTED Final   Plesimonas shigelloides NOT DETECTED NOT DETECTED Final   Salmonella species NOT DETECTED NOT DETECTED Final   Yersinia enterocolitica NOT DETECTED NOT DETECTED Final   Vibrio species NOT DETECTED NOT DETECTED Final   Vibrio cholerae NOT DETECTED NOT DETECTED Final   Enteroaggregative E coli (EAEC) NOT DETECTED NOT DETECTED Final   Enteropathogenic E coli (EPEC) NOT DETECTED NOT DETECTED Final   Enterotoxigenic E coli (ETEC) NOT DETECTED NOT DETECTED Final   Shiga like toxin producing E coli (STEC) NOT DETECTED NOT DETECTED Final   Shigella/Enteroinvasive E coli (EIEC) NOT DETECTED NOT DETECTED Final   Cryptosporidium NOT DETECTED NOT DETECTED Final   Cyclospora cayetanensis NOT DETECTED NOT DETECTED Final   Entamoeba histolytica NOT DETECTED NOT DETECTED Final   Giardia lamblia NOT DETECTED NOT DETECTED Final   Adenovirus F40/41 NOT DETECTED NOT DETECTED Final   Astrovirus NOT DETECTED NOT DETECTED Final   Norovirus GI/GII NOT DETECTED NOT DETECTED Final   Rotavirus A NOT DETECTED NOT DETECTED Final   Sapovirus (I, II, IV, and V) NOT DETECTED NOT DETECTED Final    Comment: Performed at [facility], [address] [place], [place] [address]          Radiology Studies: No results found.    Scheduled Meds:  amiodarone  200 mg Per Tube Daily   apixaban  5 mg Per Tube BID   buPROPion  75 mg Per Tube BID   chlorhexidine gluconate (MEDLINE KIT)  15 mL Mouth Rinse BID   Chlorhexidine Gluconate Cloth  6 each Topical Q0600   darbepoetin (ARANESP) injection - DIALYSIS  100 mcg Intravenous Q Tue-HD    feeding supplement (PROSource TF)  45 mL Per Tube TID   guaiFENesin  5 mL Per Tube BID   insulin aspart  0-9 Units Subcutaneous Q4H   insulin glargine-yfgn  30 Units  Subcutaneous Daily   lidocaine  1 patch Transdermal Q24H   mouth rinse  15 mL Mouth Rinse 10 times per day   metaxalone  800 mg Per Tube TID   midodrine  5 mg Per Tube TID   multivitamin  1 tablet Per Tube QHS   pantoprazole sodium  40 mg Per Tube Daily   sertraline  100 mg Per Tube Daily   simethicone  40 mg Per Tube QID   sodium chloride flush  10-40 mL Intracatheter Q12H   Continuous Infusions:  sodium chloride 10 mL/hr at 10/02/2020 1429   sodium chloride 500 mL (09/01/20 1408)   sodium chloride     sodium chloride     sodium chloride     sodium chloride     feeding supplement (NEPRO CARB STEADY) 1,000 mL (10/01/20 1549)     LOS: 83 days      Debbe Odea, MD [facility] Pager: [email or web address] 10/02/2020, 12:40 PM

## 2020-10-02 NOTE — Progress Notes (Signed)
CSW attempted to reach admissions staff at [facility] without success - a voicemail was left requesting a return call.  Madilyn Fireman, MSW, LCSW Transitions of Care  Clinical Social Worker II [phone]

## 2020-10-02 NOTE — Progress Notes (Signed)
Video connection link was texted to participant via mobile number provided.  Connection was successful.

## 2020-10-02 NOTE — Progress Notes (Signed)
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  Presented from [facility] facility with altered mental status hypotension and pneumonia resultant septic shock is admitted to the intensive care unit.  Has had multiple admissions in the past for Klebsiella, MRSA and Pseudomonas urinary tract infections and pneumonia.  His most recent admission 10/03/2020 with multiple drug-resistant Klebsiella and right ureteral calculus requiring double-J stent.  Renal replacement therapy was initiated 07/18/2020.  He continues to be hemodialysis dependent his last treatment was 09/30/2020.  He had removal of 2 L ultrafiltration.  We will tentatively plan next dialysis treatment 10/02/2020.  Blood pressure 177/103 pulse 102 temperature 97.9 O2 sats 93% FiO2 40%  Labs pending.  Hemoglobin 7.5  Minimal recorded urine output 10/01/2020   Objective:  Vital signs in last 24 hours:  Temp:  [97.8 F (36.6 C)-99.1 F (37.3 C)] 97.9 F (36.6 C) (09/20 0818) Pulse Rate:  [87-101] 97 (09/20 0900) Resp:  [18-23] 20 (09/20 0900) BP: (100-175)/(62-95) 171/95 (09/20 0900) SpO2:  [92 %-100 %] 100 % (09/20 0900) FiO2 (%):  [30 %-40 %] 40 % (09/20 0800) Weight:  [80.7 kg] 80.7 kg (09/20 0500)  Weight change: 1.9 kg Filed Weights   09/28/20 0500 10/01/20 0329 10/02/20 0500  Weight: 81.2 kg 78.8 kg 80.7 kg    Intake/Output: I/O last 3 completed shifts: In: 2165 [I.V.:10; Other:490; NG/GT:1665] Out: 550 [Urine:75; Stool:475]   Intake/Output this shift:  Total I/O In: 150 [Other:60; NG/GT:90] Out: -   General:NAD, tracheostomy on vent. Heart:RRR, s1s2 nl Lungs: trach, no increased work of breathing. Abdomen:soft, Non-tender, PEG tube and ostomy bag Extremities:Trace lower extremities edema Neuro: awake Dialysis Access: LIJ TDC, LUE AVG faint T/B+ (bruit w/  auscultation), swelling of the left upper extremity noted (stable)   Basic Metabolic Panel: Recent Labs  Lab 09/26/20 0952 09/28/20 0549 09/28/20 1315 09/29/20 0406 09/30/20 0457 10/01/20 0325  NA 127* 130* 130* 131* 133* 130*  K 4.2 2.7* 4.9 4.9 3.3* 5.1  CL 88* 89* 93* 93* 94* 94*  CO2 20* _0 GLUCOSE 122* 214* 80 125* 124* 146*  BUN 119* 56* 66* 78* 17 59*  CREATININE 1.66* 0.98 1.05 1.24 0.37* 1.00  CALCIUM 9.3 8.7* 8.2* 8.8* 9.0 8.7*  MG  --  2.1  --   --   --  2.1  PHOS 6.7*  --   --   --   --   --      Liver Function Tests: Recent Labs  Lab 09/26/20 0952  ALBUMIN 1.7*    No results for input(s): LIPASE, AMYLASE in the last 168 hours. No results for input(s): AMMONIA in the last 168 hours.  CBC: Recent Labs  Lab 09/28/20 0549 09/29/20 0406 09/30/20 0457 10/01/20 0325 10/01/20 1035  WBC 21.1* 19.7* 18.7* 17.4* 15.3*  HGB 9.1* 8.8* 9.7* 7.9* 7.5*  HCT 28.0* 26.9* 29.9* 25.4* 24.1*  MCV 76.7* 76.4* 76.7* 78.6* 78.5*  PLT 459* 463* 503* 459* 431*     Cardiac Enzymes: No results for input(s): CKTOTAL, CKMB, CKMBINDEX, TROPONINI in the last 168 hours.  BNP: Invalid input(s): POCBNP  CBG: Recent Labs  Lab 10/01/20 1546 10/01/20 2007 10/01/20 2354 10/02/20 0322 10/02/20 0813  GLUCAP 137* 116* 120* 114* 145*     Microbiology: Results for orders placed or performed during the hospital encounter of 06/21/2020  Urine culture  Status: Abnormal   Collection Time: 07/08/2020  6:24 PM   Specimen: Urine, Random  Result Value Ref Range Status   Specimen Description URINE, RANDOM  Final   Special Requests   Final    NONE Performed at [facility], [place] [address] [place], [place] [address]    Culture MULTIPLE SPECIES PRESENT, SUGGEST RECOLLECTION (A)  Final   Report Status 07/13/2020 FINAL  Final  Culture, blood (routine x 2)     Status: Abnormal   Collection Time: 06/23/2020  6:42 PM   Specimen: BLOOD LEFT FOREARM  Result Value  Ref Range Status   Specimen Description BLOOD LEFT FOREARM  Final   Special Requests   Final    BOTTLES DRAWN AEROBIC AND ANAEROBIC Blood Culture results may not be optimal due to an inadequate volume of blood received in culture bottles   Culture  Setup Time   Final    ANAEROBIC BOTTLE ONLY GRAM POSITIVE COCCI CRITICAL RESULT CALLED TO, READ BACK BY AND VERIFIED WITH: G ABBOTT PHARMD 07/13/20 0207 JDW    Culture (A)  Final    STAPHYLOCOCCUS AURICULARIS THE SIGNIFICANCE OF ISOLATING THIS ORGANISM FROM A SINGLE SET OF BLOOD CULTURES WHEN MULTIPLE SETS ARE DRAWN IS UNCERTAIN. PLEASE NOTIFY THE MICROBIOLOGY DEPARTMENT WITHIN ONE WEEK IF SPECIATION AND SENSITIVITIES ARE REQUIRED. Performed at [facility], [place] [address] [place], [place] [address]    Report Status 07/14/2020 FINAL  Final  Blood Culture ID Panel (Reflexed)     Status: Abnormal   Collection Time: 07/03/2020  6:42 PM  Result Value Ref Range Status   Enterococcus faecalis NOT DETECTED NOT DETECTED Final   Enterococcus Faecium NOT DETECTED NOT DETECTED Final   Listeria monocytogenes NOT DETECTED NOT DETECTED Final   Staphylococcus species DETECTED (A) NOT DETECTED Final    Comment: CRITICAL RESULT CALLED TO, READ BACK BY AND VERIFIED WITH: G ABBOTT PHARMD 07/13/20 0207 JDW    Staphylococcus aureus (BCID) NOT DETECTED NOT DETECTED Final   Staphylococcus epidermidis NOT DETECTED NOT DETECTED Final   Staphylococcus lugdunensis NOT DETECTED NOT DETECTED Final   Streptococcus species NOT DETECTED NOT DETECTED Final   Streptococcus agalactiae NOT DETECTED NOT DETECTED Final   Streptococcus pneumoniae NOT DETECTED NOT DETECTED Final   Streptococcus pyogenes NOT DETECTED NOT DETECTED Final   A.calcoaceticus-baumannii NOT DETECTED NOT DETECTED Final   Bacteroides fragilis NOT DETECTED NOT DETECTED Final   Enterobacterales NOT DETECTED NOT DETECTED Final   Enterobacter cloacae complex NOT DETECTED NOT DETECTED Final   Escherichia  coli NOT DETECTED NOT DETECTED Final   Klebsiella aerogenes NOT DETECTED NOT DETECTED Final   Klebsiella oxytoca NOT DETECTED NOT DETECTED Final   Klebsiella pneumoniae NOT DETECTED NOT DETECTED Final   Proteus species NOT DETECTED NOT DETECTED Final   Salmonella species NOT DETECTED NOT DETECTED Final   Serratia marcescens NOT DETECTED NOT DETECTED Final   Haemophilus influenzae NOT DETECTED NOT DETECTED Final   Neisseria meningitidis NOT DETECTED NOT DETECTED Final   Pseudomonas aeruginosa NOT DETECTED NOT DETECTED Final   Stenotrophomonas maltophilia NOT DETECTED NOT DETECTED Final   Candida albicans NOT DETECTED NOT DETECTED Final   Candida auris NOT DETECTED NOT DETECTED Final   Candida glabrata NOT DETECTED NOT DETECTED Final   Candida krusei NOT DETECTED NOT DETECTED Final   Candida parapsilosis NOT DETECTED NOT DETECTED Final   Candida tropicalis NOT DETECTED NOT DETECTED Final   Cryptococcus neoformans/gattii NOT DETECTED NOT DETECTED Final    Comment: Performed  at [facility], [place] [address] [place], [place] [address]  Resp Panel by RT-PCR (Flu A&B, Covid) Nasopharyngeal Swab     Status: None   Collection Time: 06/13/2020  7:22 PM   Specimen: Nasopharyngeal Swab; Nasopharyngeal(NP) swabs in vial transport medium  Result Value Ref Range Status   SARS Coronavirus 2 by RT PCR NEGATIVE NEGATIVE Final    Comment: (NOTE) SARS-CoV-2 target nucleic acids are NOT DETECTED.  The SARS-CoV-2 RNA is generally detectable in upper respiratory specimens during the acute phase of infection. The lowest concentration of SARS-CoV-2 viral copies this assay can detect is 138 copies/mL. A negative result does not preclude SARS-Cov-2 infection and should not be used as the sole basis for treatment or other patient management decisions. A negative result may occur with  improper specimen collection/handling, submission of specimen other than nasopharyngeal swab, presence of viral mutation(s)  within the areas targeted by this assay, and inadequate number of viral copies(<138 copies/mL). A negative result must be combined with clinical observations, patient history, and epidemiological information. The expected result is Negative.  Fact Sheet for Patients:  EntrepreneurPulse.com.au  Fact Sheet for Healthcare Providers:  IncredibleEmployment.be  This test is no t yet approved or cleared by the [place] FDA and  has been authorized for detection and/or diagnosis of SARS-CoV-2 by FDA under an Emergency Use Authorization (EUA). This EUA will remain  in effect (meaning this test can be used) for the duration of the COVID-19 declaration under Section 564(b)(1) of the Act, 21 U.S.C.section 360bbb-3(b)(1), unless the authorization is terminated  or revoked sooner.       Influenza A by PCR NEGATIVE NEGATIVE Final   Influenza B by PCR NEGATIVE NEGATIVE Final    Comment: (NOTE) The Xpert Xpress SARS-CoV-2/FLU/RSV plus assay is intended as an aid in the diagnosis of influenza from Nasopharyngeal swab specimens and should not be used as a sole basis for treatment. Nasal washings and aspirates are unacceptable for Xpert Xpress SARS-CoV-2/FLU/RSV testing.  Fact Sheet for Patients: EntrepreneurPulse.com.au  Fact Sheet for Healthcare Providers: IncredibleEmployment.be  This test is not yet approved or cleared by the [place] FDA and has been authorized for detection and/or diagnosis of SARS-CoV-2 by FDA under an Emergency Use Authorization (EUA). This EUA will remain in effect (meaning this test can be used) for the duration of the COVID-19 declaration under Section 564(b)(1) of the Act, 21 U.S.C. section 360bbb-3(b)(1), unless the authorization is terminated or revoked.  Performed at [facility], [place] [address] [place], [place] [address]   Culture, blood (routine x 2)     Status: None    Collection Time: 07/03/2020  7:23 PM   Specimen: BLOOD  Result Value Ref Range Status   Specimen Description BLOOD RIGHT ANTECUBITAL  Final   Special Requests   Final    BOTTLES DRAWN AEROBIC AND ANAEROBIC Blood Culture adequate volume   Culture   Final    NO GROWTH 5 DAYS Performed at [facility], [place] [address], [place],  [address]    Report Status 07/16/2020 FINAL  Final  MRSA Next Gen by PCR, Nasal     Status: Abnormal   Collection Time: 06/24/2020 10:29 PM   Specimen: Nasal Mucosa; Nasal Swab  Result Value Ref Range Status   MRSA by PCR Next Gen DETECTED (A) NOT DETECTED Final    Comment: RESULT CALLED TO, READ BACK BY AND VERIFIED WITH: Ayesha Rumpf RN 07/12/20 0428 JDW (NOTE) The GeneXpert MRSA  Assay (FDA approved for NASAL specimens only), is one component of a comprehensive MRSA colonization surveillance program. It is not intended to diagnose MRSA infection nor to guide or monitor treatment for MRSA infections. Test performance is not FDA approved in patients less than 59 years old. Performed at [facility], [place] [address], [place], [place] [address]   Culture, Respiratory w Gram Stain     Status: None   Collection Time: 07/12/20  3:48 PM   Specimen: Tracheal Aspirate; Respiratory  Result Value Ref Range Status   Specimen Description TRACHEAL ASPIRATE  Final   Special Requests NONE  Final   Gram Stain   Final    MODERATE WBC PRESENT, PREDOMINANTLY PMN FEW GRAM NEGATIVE RODS FEW GRAM POSITIVE COCCI IN PAIRS    Culture   Final    MODERATE ACINETOBACTER BAUMANNII MULTI-DRUG RESISTANT ORGANISM RESULT CALLED TO, READ BACK BY AND VERIFIED WITH: RN J.BEABRAUT ON [id number] AT 1410 BY E.PARRISH Performed at [facility], [place] [address] [place], [place] [address]    Report Status 07/14/2020 FINAL  Final   Organism ID, Bacteria ACINETOBACTER BAUMANNII  Final      Susceptibility   Acinetobacter baumannii - MIC*    CEFTAZIDIME 32 RESISTANT Resistant      CIPROFLOXACIN >=4 RESISTANT Resistant     GENTAMICIN >=16 RESISTANT Resistant     IMIPENEM >=16 RESISTANT Resistant     PIP/TAZO >=128 RESISTANT Resistant     TRIMETH/SULFA >=320 RESISTANT Resistant     AMPICILLIN/SULBACTAM 8 SENSITIVE Sensitive     * MODERATE ACINETOBACTER BAUMANNII  Culture, Urine     Status: Abnormal   Collection Time: 07/13/20 11:26 AM   Specimen: Urine, Random  Result Value Ref Range Status   Specimen Description URINE, RANDOM  Final   Special Requests   Final    NONE Performed at [facility], [place] [address] [place] [address], [place], [place] [address]    Culture MULTIPLE SPECIES PRESENT, SUGGEST RECOLLECTION (A)  Final   Report Status 07/14/2020 FINAL  Final  Culture, blood (routine x 2)     Status: None   Collection Time: 07/24/20  3:53 PM   Specimen: BLOOD RIGHT HAND  Result Value Ref Range Status   Specimen Description BLOOD RIGHT HAND  Final   Special Requests   Final    BOTTLES DRAWN AEROBIC AND ANAEROBIC Blood Culture results may not be optimal due to an inadequate volume of blood received in culture bottles   Culture   Final    NO GROWTH 5 DAYS Performed at [facility], [place] [address] [place], [place] [address]    Report Status 07/29/2020 FINAL  Final  Culture, blood (routine x 2)     Status: None   Collection Time: 07/24/20  4:10 PM   Specimen: BLOOD LEFT HAND  Result Value Ref Range Status   Specimen Description BLOOD LEFT HAND  Final   Special Requests   Final    BOTTLES DRAWN AEROBIC AND ANAEROBIC Blood Culture adequate volume   Culture   Final    NO GROWTH 5 DAYS Performed at [facility], [place] [address] [place],  [address]    Report Status 07/29/2020 FINAL  Final  MRSA Next Gen by PCR, Nasal     Status: None   Collection Time: 08/23/20  4:17 PM   Specimen: Nasal Mucosa; Nasal Swab  Result Value Ref Range Status   MRSA by PCR Next Gen NOT DETECTED NOT  DETECTED Final    Comment: (NOTE) The GeneXpert MRSA Assay (FDA approved  for NASAL specimens only), is one component of a comprehensive MRSA colonization surveillance program. It is not intended to diagnose MRSA infection nor to guide or monitor treatment for MRSA infections. Test performance is not FDA approved in patients less than 70 years old. Performed at [facility], [place] [address], [place], [place] [address]   Culture, blood (routine x 2)     Status: None   Collection Time: 08/26/20  9:32 AM   Specimen: BLOOD RIGHT HAND  Result Value Ref Range Status   Specimen Description BLOOD RIGHT HAND  Final   Special Requests   Final    BOTTLES DRAWN AEROBIC ONLY Blood Culture results may not be optimal due to an inadequate volume of blood received in culture bottles   Culture   Final    NO GROWTH 5 DAYS Performed at [facility], [place] [address], [place], [place] [address]    Report Status 08/31/2020 FINAL  Final  Culture, blood (routine x 2)     Status: Abnormal   Collection Time: 08/26/20  9:38 AM   Specimen: BLOOD LEFT HAND  Result Value Ref Range Status   Specimen Description BLOOD LEFT HAND  Final   Special Requests   Final    BOTTLES DRAWN AEROBIC AND ANAEROBIC Blood Culture adequate volume   Culture  Setup Time   Final    GRAM POSITIVE COCCI IN CLUSTERS AEROBIC BOTTLE ONLY CRITICAL RESULT CALLED TO, READ BACK BY AND VERIFIED WITH: PHARM D A.LAWLESS ON [id number] AT 0945 BY E.PARRISH    Culture (A)  Final    STAPHYLOCOCCUS EPIDERMIDIS THE SIGNIFICANCE OF ISOLATING THIS ORGANISM FROM A SINGLE SET OF BLOOD CULTURES WHEN MULTIPLE SETS ARE DRAWN IS UNCERTAIN. PLEASE NOTIFY THE MICROBIOLOGY DEPARTMENT WITHIN ONE WEEK IF SPECIATION AND SENSITIVITIES ARE REQUIRED. Performed at [facility], [place] [address] [place], [place] [address]    Report Status 08/29/2020 FINAL  Final  Culture, Respiratory w Gram Stain     Status: None   Collection Time: 08/27/20  9:15 AM   Specimen: Tracheal Aspirate; Respiratory  Result Value Ref Range Status    Specimen Description TRACHEAL ASPIRATE  Final   Special Requests NONE  Final   Gram Stain   Final    ABUNDANT WBC PRESENT, PREDOMINANTLY PMN FEW GRAM POSITIVE COCCI FEW GRAM NEGATIVE RODS RARE GRAM POSITIVE RODS    Culture   Final    ABUNDANT ACINETOBACTER BAUMANNII MULTI-DRUG RESISTANT ORGANISM CRITICAL RESULT CALLED TO, READ BACK BY AND VERIFIED WITH: PHARMD K MCCLURE [id number] AT 1101 AM BY CM Performed at [facility], [place] [address] [place], [place] [address]    Report Status 08/30/2020 FINAL  Final   Organism ID, Bacteria ACINETOBACTER BAUMANNII  Final      Susceptibility   Acinetobacter baumannii - MIC*    CEFTAZIDIME 16 INTERMEDIATE Intermediate     CIPROFLOXACIN >=4 RESISTANT Resistant     GENTAMICIN >=16 RESISTANT Resistant     IMIPENEM >=16 RESISTANT Resistant     PIP/TAZO >=128 RESISTANT Resistant     TRIMETH/SULFA >=320 RESISTANT Resistant     AMPICILLIN/SULBACTAM 4 SENSITIVE Sensitive     * ABUNDANT ACINETOBACTER BAUMANNII  Surgical PCR screen     Status: None   Collection Time: 09/29/2020  9:25 AM   Specimen: Nasal Mucosa; Nasal Swab  Result Value Ref Range Status   MRSA, PCR NEGATIVE NEGATIVE  Final   Staphylococcus aureus NEGATIVE NEGATIVE Final    Comment: (NOTE) The Xpert SA Assay (FDA approved for NASAL specimens in patients 38 years of age and older), is one component of a comprehensive surveillance program. It is not intended to diagnose infection nor to guide or monitor treatment. Performed at [facility], [place] [address] [place], [place] [address]   Gastrointestinal Panel by PCR , Stool     Status: None   Collection Time: 09/28/20  1:16 PM   Specimen: Stool  Result Value Ref Range Status   Campylobacter species NOT DETECTED NOT DETECTED Final   Plesimonas shigelloides NOT DETECTED NOT DETECTED Final   Salmonella species NOT DETECTED NOT DETECTED Final   Yersinia enterocolitica NOT DETECTED NOT DETECTED Final   Vibrio species NOT  DETECTED NOT DETECTED Final   Vibrio cholerae NOT DETECTED NOT DETECTED Final   Enteroaggregative E coli (EAEC) NOT DETECTED NOT DETECTED Final   Enteropathogenic E coli (EPEC) NOT DETECTED NOT DETECTED Final   Enterotoxigenic E coli (ETEC) NOT DETECTED NOT DETECTED Final   Shiga like toxin producing E coli (STEC) NOT DETECTED NOT DETECTED Final   Shigella/Enteroinvasive E coli (EIEC) NOT DETECTED NOT DETECTED Final   Cryptosporidium NOT DETECTED NOT DETECTED Final   Cyclospora cayetanensis NOT DETECTED NOT DETECTED Final   Entamoeba histolytica NOT DETECTED NOT DETECTED Final   Giardia lamblia NOT DETECTED NOT DETECTED Final   Adenovirus F40/41 NOT DETECTED NOT DETECTED Final   Astrovirus NOT DETECTED NOT DETECTED Final   Norovirus GI/GII NOT DETECTED NOT DETECTED Final   Rotavirus A NOT DETECTED NOT DETECTED Final   Sapovirus (I, II, IV, and V) NOT DETECTED NOT DETECTED Final    Comment: Performed at [facility], [place] [place], [place] [address]    Coagulation Studies: No results for input(s): LABPROT, INR in the last 72 hours.  Urinalysis: No results for input(s): COLORURINE, LABSPEC, PHURINE, GLUCOSEU, HGBUR, BILIRUBINUR, KETONESUR, PROTEINUR, UROBILINOGEN, NITRITE, LEUKOCYTESUR in the last 72 hours.  Invalid input(s): APPERANCEUR    Imaging: No results found.   Medications:    sodium chloride 10 mL/hr at 10/01/2020 1429   sodium chloride 500 mL (09/01/20 1408)   sodium chloride     sodium chloride     feeding supplement (NEPRO CARB STEADY) 1,000 mL (10/01/20 1549)    amiodarone  200 mg Per Tube Daily   apixaban  5 mg Per Tube BID   buPROPion  75 mg Per Tube BID   chlorhexidine gluconate (MEDLINE KIT)  15 mL Mouth Rinse BID   Chlorhexidine Gluconate Cloth  6 each Topical Q0600   darbepoetin (ARANESP) injection - DIALYSIS  100 mcg Intravenous Q Tue-HD   feeding supplement (PROSource TF)  45 mL Per Tube TID   guaiFENesin  5 mL Per Tube BID    insulin aspart  0-9 Units Subcutaneous Q4H   insulin glargine-yfgn  30 Units Subcutaneous Daily   lidocaine  1 patch Transdermal Q24H   mouth rinse  15 mL Mouth Rinse 10 times per day   metaxalone  800 mg Per Tube TID   midodrine  5 mg Per Tube TID   multivitamin  1 tablet Per Tube QHS   pantoprazole sodium  40 mg Per Tube Daily   sertraline  100 mg Per Tube Daily   simethicone  40 mg Per Tube QID   sodium chloride flush  10-40 mL Intracatheter Q12H   sodium chloride, sodium chloride, sodium chloride, alteplase, glycopyrrolate,  heparin, heparin, heparin sodium (porcine), ipratropium-albuterol, lidocaine (PF), lidocaine (PF), lidocaine-prilocaine, morphine injection, oxyCODONE, pentafluoroprop-tetrafluoroeth, phenol, polyethylene glycol, sodium chloride flush  Assessment/ Plan:  1.Acute kidney Injury-prolonged dialysis dependency, now progressed to ESRD:  ATN from septic shock-without any evidence of renal recovery to date . initiated on CRRT 07/18/20 and transitioned to IHD.  Has [facility] & AVG, continue with HD on TTS schedule for now.  -Unable to sit on chair or recliner.   2 Vascular access - had left forearm AVG placed 08/19/2020 but unfortunately had clotted.  s/p thrombectomy and revision 9/2, graft can be used 4 weeks thereafter.  Swelling of left upper extremity noted continue arm elevation and supportive care.  Continue to follow  3 Septic shock/ventilator associated pneumonia due to recurrent Acinetobacter infection: Off antibiotics and stable hemodynamics.   4 Leukocytosis -persistent, will defer to primary service for further mgmt   5 Chronic respiratory failure: Status post tracheostomy with ventilator management per CCM.   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   7 Chronic kidney disease/metabolic bone disease: Calcium and phosphorus level under control with hemodialysis, off binders   8 Anemia: contiue ESA, monitor Hb.  Transfuse  as needed.  9 Acute metabolic encephalopathy: Continue management as above.,  Supportive care.   10 Disposition - pt cannot return to [facility] now that he is ESRD. He is going to [facility] in [place], [place]. He needs to have [place] Medicaid & his AVG needs to be mature prior to being accepted. Other possible barrier is pt is a permanent trach & vent pt.    LOS: [place] _0 _1 :51 AM

## 2020-10-02 NOTE — Progress Notes (Signed)
   10/02/20 1800  Vitals  Temp 98 F (36.7 C)  Temp Source Oral  BP 113/79  MAP (mmHg) 91  BP Location Left Leg  BP Method Automatic  Patient Position (if appropriate) Lying  Pulse Rate (!) 111  Pulse Rate Source Monitor  ECG Heart Rate (!) 112  Resp 20  Oxygen Therapy  SpO2 95 %  O2 Device Ventilator  Dialysis Weight  Weight 79.5 kg  Type of Weight Post-Dialysis  Post-Hemodialysis Assessment  Rinseback Volume (mL) 250 mL  KECN 262 V  Dialyzer Clearance Lightly streaked  Duration of HD Treatment -hour(s) 3 hour(s)  Hemodialysis Intake (mL) 500 mL  UF Total -Machine (mL) 3000 mL  Net UF (mL) 2500 mL  Tolerated HD Treatment Yes  Post-Hemodialysis Comments  (hd tx complete-pt stable)  Hemodialysis Catheter Right Internal jugular Double lumen Permanent (Tunneled)  Placement Date/Time: 08/07/20 1032   Placed prior to admission: No  Time Out: Correct patient;Correct site;Correct procedure  Maximum sterile barrier precautions: Hand hygiene;Cap;Mask;Sterile gown;Sterile gloves;Large sterile sheet  Site Prep: Chlorh...  Site Condition No complications  Blue Lumen Status Flushed;Heparin locked  Red Lumen Status Flushed;Heparin locked  Catheter fill solution Heparin 1000 units/ml  Catheter fill volume (Arterial) 1.9 cc  Catheter fill volume (Venous) 1.9  Dressing Type Transparent  Dressing Status Clean;Dry;Intact  Antimicrobial disc in place? Yes  Interventions Other (Comment) (assessed)  Dressing Change Due 10/08/20  Post treatment catheter status Capped and Clamped  HD tx complete, goal met. Pt noted with fluctuating heart rate in low 100s. No distress noted.

## 2020-10-03 DIAGNOSIS — J9601 Acute respiratory failure with hypoxia: Secondary | ICD-10-CM | POA: Diagnosis not present

## 2020-10-03 LAB — CBC
HCT: 25.9 % — ABNORMAL LOW (ref 39.0–52.0)
Hemoglobin: 8.1 g/dL — ABNORMAL LOW (ref 13.0–17.0)
MCH: 24.5 pg — ABNORMAL LOW (ref 26.0–34.0)
MCHC: 31.3 g/dL (ref 30.0–36.0)
MCV: 78.2 fL — ABNORMAL LOW (ref 80.0–100.0)
Platelets: 442 10*3/uL — ABNORMAL HIGH (ref 150–400)
RBC: 3.31 MIL/uL — ABNORMAL LOW (ref 4.22–5.81)
RDW: 19.5 % — ABNORMAL HIGH (ref 11.5–15.5)
WBC: 25.6 10*3/uL — ABNORMAL HIGH (ref 4.0–10.5)
nRBC: 0.3 % — ABNORMAL HIGH (ref 0.0–0.2)

## 2020-10-03 LAB — BASIC METABOLIC PANEL
Anion gap: 13 (ref 5–15)
BUN: 44 mg/dL — ABNORMAL HIGH (ref 8–23)
CO2: 25 mmol/L (ref 22–32)
Calcium: 8.9 mg/dL (ref 8.9–10.3)
Chloride: 94 mmol/L — ABNORMAL LOW (ref 98–111)
Creatinine, Ser: 0.89 mg/dL (ref 0.61–1.24)
GFR, Estimated: >60 mL/min (ref >60)
Glucose, Bld: 200 mg/dL — ABNORMAL HIGH (ref 70–99)
Potassium: 3.5 mmol/L (ref 3.5–5.1)
Sodium: 132 mmol/L — ABNORMAL LOW (ref 135–145)

## 2020-10-03 LAB — GLUCOSE, CAPILLARY
Glucose-Capillary: 184 mg/dL — ABNORMAL HIGH (ref 70–99)
Glucose-Capillary: 164 mg/dL — ABNORMAL HIGH (ref 70–99)
Glucose-Capillary: 165 mg/dL — ABNORMAL HIGH (ref 70–99)
Glucose-Capillary: 145 mg/dL — ABNORMAL HIGH (ref 70–99)
Glucose-Capillary: 138 mg/dL — ABNORMAL HIGH (ref 70–99)
Glucose-Capillary: 104 mg/dL — ABNORMAL HIGH (ref 70–99)

## 2020-10-03 MED ORDER — CHLORHEXIDINE GLUCONATE CLOTH 2 % EX PADS
6.0000 | MEDICATED_PAD | Freq: Every day | CUTANEOUS | Status: DC
  Administered 2020-10-03 – 2020-10-04 (×2): 6 via TOPICAL

## 2020-10-03 NOTE — Progress Notes (Signed)
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  Presented from [facility] facility with altered mental status hypotension and pneumonia resultant septic shock is admitted to the intensive care unit.  Has had multiple admissions in the past for Klebsiella, MRSA and Pseudomonas urinary tract infections and pneumonia.  His most recent admission 10/03/2020 with multiple drug-resistant Klebsiella and right ureteral calculus requiring double-J stent.  Renal replacement therapy was initiated 07/18/2020.  Underwent successful dialysis 10/02/2020 with 2.5 L removed.  Next dialysis treatment 03/06/2020  Blood pressure 155/84 pulse 100 temperature 98.8 O2 sats 100% FiO2 40%  Sodium 132 potassium 3.5 chloride 95 CO2 25 BUN 44 creatinine 0.89 glucose 200 calcium 8.9 hemoglobin 8.1  Minimal recorded urine output 10/02/2020   Objective:  Vital signs in last 24 hours:  Temp:  [97.4 F (36.3 C)-98.8 F (37.1 C)] 98.8 F (37.1 C) (09/21 0700) Pulse Rate:  [92-114] 99 (09/21 0600) Resp:  [16-23] 20 (09/21 0600) BP: (92-177)/(68-109) 111/70 (09/21 0600) SpO2:  [94 %-100 %] 100 % (09/21 0600) FiO2 (%):  [40 %] 40 % (09/21 0733) Weight:  [79.5 kg-79.9 kg] 79.9 kg (09/21 0342)  Weight change: -1.2 kg Filed Weights   10/02/20 0500 10/02/20 1800 10/03/20 0342  Weight: 80.7 kg 79.5 kg 79.9 kg    Intake/Output: I/O last 3 completed shifts: In: 2205 [I.V.:20; Other:610; NG/GT:1575] Out: 2785 [Urine:135; Other:2500; Stool:150]   Intake/Output this shift:  No intake/output data recorded.  General:NAD, tracheostomy on vent. Heart:RRR, s1s2 nl Lungs: trach, no increased work of breathing. Abdomen:soft, Non-tender, PEG tube and ostomy bag Extremities:Trace lower extremities edema Neuro: awake Dialysis Access: LIJ TDC, LUE AVG faint T/B+ (bruit w/  auscultation), swelling of the left upper extremity noted (stable)   Basic Metabolic Panel: Recent Labs  Lab 09/26/20 0952 09/28/20 0549 09/28/20 1315 09/29/20 0406 09/30/20 0457 10/01/20 0325 10/02/20 1256 10/03/20 0341  NA 127* 130*   < > 131* 133* 130* 129* 132*  K 4.2 2.7*   < > 4.9 3.3* 5.1 5.5* 3.5  CL 88* 89*   < > 93* 94* 94* 92* 94*  CO2 20* 25   < > _0 21* 25  GLUCOSE 122* 214*   < > 125* 124* 146* 185* 200*  BUN 119* 56*   < > 78* 17 59* 93* 44*  CREATININE 1.66* 0.98   < > 1.24 0.37* 1.00 1.39* 0.89  CALCIUM 9.3 8.7*   < > 8.8* 9.0 8.7* 9.2 8.9  MG  --  2.1  --   --   --  2.1  --   --   PHOS 6.7*  --   --   --   --   --  7.3*  --    < > = values in this interval not displayed.     Liver Function Tests: Recent Labs  Lab 09/26/20 0952 10/02/20 1256  ALBUMIN 1.7* 1.7*    No results for input(s): LIPASE, AMYLASE in the last 168 hours. No results for input(s): AMMONIA in the last 168 hours.  CBC: Recent Labs  Lab 09/30/20 0457 10/01/20 0325 10/01/20 1035 10/02/20 1256 10/03/20 0341  WBC 18.7* 17.4* 15.3* 21.9* 25.6*  HGB 9.7* 7.9* 7.5* 8.3* 8.1*  HCT 29.9* 25.4* 24.1* 26.0* 25.9*  MCV 76.7* 78.6* 78.5* 78.3* 78.2*  PLT 503* 459* 431* 450* 442*     Cardiac  Enzymes: No results for input(s): CKTOTAL, CKMB, CKMBINDEX, TROPONINI in the last 168 hours.  BNP: Invalid input(s): POCBNP  CBG: Recent Labs  Lab 10/02/20 1531 10/02/20 1939 10/02/20 2337 10/03/20 0328 10/03/20 0728  GLUCAP 134* 179* 186* 184* 164*     Microbiology: Results for orders placed or performed during the hospital encounter of 06/16/2020  Urine culture     Status: Abnormal   Collection Time: 06/19/2020  6:24 PM   Specimen: Urine, Random  Result Value Ref Range Status   Specimen Description URINE, RANDOM  Final   Special Requests   Final    NONE Performed at [facility], [place] [address] [place], [place] [address]    Culture MULTIPLE SPECIES PRESENT, SUGGEST  RECOLLECTION (A)  Final   Report Status 07/13/2020 FINAL  Final  Culture, blood (routine x 2)     Status: Abnormal   Collection Time: 06/13/2020  6:42 PM   Specimen: BLOOD LEFT FOREARM  Result Value Ref Range Status   Specimen Description BLOOD LEFT FOREARM  Final   Special Requests   Final    BOTTLES DRAWN AEROBIC AND ANAEROBIC Blood Culture results may not be optimal due to an inadequate volume of blood received in culture bottles   Culture  Setup Time   Final    ANAEROBIC BOTTLE ONLY GRAM POSITIVE COCCI CRITICAL RESULT CALLED TO, READ BACK BY AND VERIFIED WITH: G ABBOTT PHARMD 07/13/20 0207 JDW    Culture (A)  Final    STAPHYLOCOCCUS AURICULARIS THE SIGNIFICANCE OF ISOLATING THIS ORGANISM FROM A SINGLE SET OF BLOOD CULTURES WHEN MULTIPLE SETS ARE DRAWN IS UNCERTAIN. PLEASE NOTIFY THE MICROBIOLOGY DEPARTMENT WITHIN ONE WEEK IF SPECIATION AND SENSITIVITIES ARE REQUIRED. Performed at [facility], [place] [address], [place], [place] [address]    Report Status 07/14/2020 FINAL  Final  Blood Culture ID Panel (Reflexed)     Status: Abnormal   Collection Time: 06/15/2020  6:42 PM  Result Value Ref Range Status   Enterococcus faecalis NOT DETECTED NOT DETECTED Final   Enterococcus Faecium NOT DETECTED NOT DETECTED Final   Listeria monocytogenes NOT DETECTED NOT DETECTED Final   Staphylococcus species DETECTED (A) NOT DETECTED Final    Comment: CRITICAL RESULT CALLED TO, READ BACK BY AND VERIFIED WITH: G ABBOTT PHARMD 07/13/20 0207 JDW    Staphylococcus aureus (BCID) NOT DETECTED NOT DETECTED Final   Staphylococcus epidermidis NOT DETECTED NOT DETECTED Final   Staphylococcus lugdunensis NOT DETECTED NOT DETECTED Final   Streptococcus species NOT DETECTED NOT DETECTED Final   Streptococcus agalactiae NOT DETECTED NOT DETECTED Final   Streptococcus pneumoniae NOT DETECTED NOT DETECTED Final   Streptococcus pyogenes NOT DETECTED NOT DETECTED Final   A.calcoaceticus-baumannii NOT DETECTED NOT  DETECTED Final   Bacteroides fragilis NOT DETECTED NOT DETECTED Final   Enterobacterales NOT DETECTED NOT DETECTED Final   Enterobacter cloacae complex NOT DETECTED NOT DETECTED Final   Escherichia coli NOT DETECTED NOT DETECTED Final   Klebsiella aerogenes NOT DETECTED NOT DETECTED Final   Klebsiella oxytoca NOT DETECTED NOT DETECTED Final   Klebsiella pneumoniae NOT DETECTED NOT DETECTED Final   Proteus species NOT DETECTED NOT DETECTED Final   Salmonella species NOT DETECTED NOT DETECTED Final   Serratia marcescens NOT DETECTED NOT DETECTED Final   Haemophilus influenzae NOT DETECTED NOT DETECTED Final   Neisseria meningitidis NOT DETECTED NOT DETECTED Final   Pseudomonas aeruginosa NOT DETECTED NOT DETECTED Final   Stenotrophomonas maltophilia NOT DETECTED NOT DETECTED Final  Candida albicans NOT DETECTED NOT DETECTED Final   Candida auris NOT DETECTED NOT DETECTED Final   Candida glabrata NOT DETECTED NOT DETECTED Final   Candida krusei NOT DETECTED NOT DETECTED Final   Candida parapsilosis NOT DETECTED NOT DETECTED Final   Candida tropicalis NOT DETECTED NOT DETECTED Final   Cryptococcus neoformans/gattii NOT DETECTED NOT DETECTED Final    Comment: Performed at [facility], [place] [address] [place], [place] [address]  Resp Panel by RT-PCR (Flu A&B, Covid) Nasopharyngeal Swab     Status: None   Collection Time: 07/05/2020  7:22 PM   Specimen: Nasopharyngeal Swab; Nasopharyngeal(NP) swabs in vial transport medium  Result Value Ref Range Status   SARS Coronavirus 2 by RT PCR NEGATIVE NEGATIVE Final    Comment: (NOTE) SARS-CoV-2 target nucleic acids are NOT DETECTED.  The SARS-CoV-2 RNA is generally detectable in upper respiratory specimens during the acute phase of infection. The lowest concentration of SARS-CoV-2 viral copies this assay can detect is 138 copies/mL. A negative result does not preclude SARS-Cov-2 infection and should not be used as the sole basis for  treatment or other patient management decisions. A negative result may occur with  improper specimen collection/handling, submission of specimen other than nasopharyngeal swab, presence of viral mutation(s) within the areas targeted by this assay, and inadequate number of viral copies(<138 copies/mL). A negative result must be combined with clinical observations, patient history, and epidemiological information. The expected result is Negative.  Fact Sheet for Patients:  EntrepreneurPulse.com.au  Fact Sheet for Healthcare Providers:  IncredibleEmployment.be  This test is no t yet approved or cleared by the [place] FDA and  has been authorized for detection and/or diagnosis of SARS-CoV-2 by FDA under an Emergency Use Authorization (EUA). This EUA will remain  in effect (meaning this test can be used) for the duration of the COVID-19 declaration under Section 564(b)(1) of the Act, 21 U.S.C.section 360bbb-3(b)(1), unless the authorization is terminated  or revoked sooner.       Influenza A by PCR NEGATIVE NEGATIVE Final   Influenza B by PCR NEGATIVE NEGATIVE Final    Comment: (NOTE) The Xpert Xpress SARS-CoV-2/FLU/RSV plus assay is intended as an aid in the diagnosis of influenza from Nasopharyngeal swab specimens and should not be used as a sole basis for treatment. Nasal washings and aspirates are unacceptable for Xpert Xpress SARS-CoV-2/FLU/RSV testing.  Fact Sheet for Patients: EntrepreneurPulse.com.au  Fact Sheet for Healthcare Providers: IncredibleEmployment.be  This test is not yet approved or cleared by the [place] FDA and has been authorized for detection and/or diagnosis of SARS-CoV-2 by FDA under an Emergency Use Authorization (EUA). This EUA will remain in effect (meaning this test can be used) for the duration of the COVID-19 declaration under Section 564(b)(1) of the Act, 21  U.S.C. section 360bbb-3(b)(1), unless the authorization is terminated or revoked.  Performed at [facility], [place] [address], [place], [place] [address]   Culture, blood (routine x 2)     Status: None   Collection Time: 07/02/2020  7:23 PM   Specimen: BLOOD  Result Value Ref Range Status   Specimen Description BLOOD RIGHT ANTECUBITAL  Final   Special Requests   Final    BOTTLES DRAWN AEROBIC AND ANAEROBIC Blood Culture adequate volume   Culture   Final    NO GROWTH 5 DAYS Performed at [facility], [place] [address], [place], [place] [address]    Report Status 07/16/2020 FINAL  Final  MRSA  Next Gen by PCR, Nasal     Status: Abnormal   Collection Time: 06/15/2020 10:29 PM   Specimen: Nasal Mucosa; Nasal Swab  Result Value Ref Range Status   MRSA by PCR Next Gen DETECTED (A) NOT DETECTED Final    Comment: RESULT CALLED TO, READ BACK BY AND VERIFIED WITH: Ayesha Rumpf RN 07/12/20 0428 JDW (NOTE) The GeneXpert MRSA Assay (FDA approved for NASAL specimens only), is one component of a comprehensive MRSA colonization surveillance program. It is not intended to diagnose MRSA infection nor to guide or monitor treatment for MRSA infections. Test performance is not FDA approved in patients less than 12 years old. Performed at [facility], [place] [address], [place], [place] [address]   Culture, Respiratory w Gram Stain     Status: None   Collection Time: 07/12/20  3:48 PM   Specimen: Tracheal Aspirate; Respiratory  Result Value Ref Range Status   Specimen Description TRACHEAL ASPIRATE  Final   Special Requests NONE  Final   Gram Stain   Final    MODERATE WBC PRESENT, PREDOMINANTLY PMN FEW GRAM NEGATIVE RODS FEW GRAM POSITIVE COCCI IN PAIRS    Culture   Final    MODERATE ACINETOBACTER BAUMANNII MULTI-DRUG RESISTANT ORGANISM RESULT CALLED TO, READ BACK BY AND VERIFIED WITH: RN J.BEABRAUT ON [id number] AT 1410 BY E.PARRISH Performed at [facility], [place] [address]  [place], [place] [address]    Report Status 07/14/2020 FINAL  Final   Organism ID, Bacteria ACINETOBACTER BAUMANNII  Final      Susceptibility   Acinetobacter baumannii - MIC*    CEFTAZIDIME 32 RESISTANT Resistant     CIPROFLOXACIN >=4 RESISTANT Resistant     GENTAMICIN >=16 RESISTANT Resistant     IMIPENEM >=16 RESISTANT Resistant     PIP/TAZO >=128 RESISTANT Resistant     TRIMETH/SULFA >=320 RESISTANT Resistant     AMPICILLIN/SULBACTAM 8 SENSITIVE Sensitive     * MODERATE ACINETOBACTER BAUMANNII  Culture, Urine     Status: Abnormal   Collection Time: 07/13/20 11:26 AM   Specimen: Urine, Random  Result Value Ref Range Status   Specimen Description URINE, RANDOM  Final   Special Requests   Final    NONE Performed at [facility], [place] [address], [place], [place] [address]    Culture MULTIPLE SPECIES PRESENT, SUGGEST RECOLLECTION (A)  Final   Report Status 07/14/2020 FINAL  Final  Culture, blood (routine x 2)     Status: None   Collection Time: 07/24/20  3:53 PM   Specimen: BLOOD RIGHT HAND  Result Value Ref Range Status   Specimen Description BLOOD RIGHT HAND  Final   Special Requests   Final    BOTTLES DRAWN AEROBIC AND ANAEROBIC Blood Culture results may not be optimal due to an inadequate volume of blood received in culture bottles   Culture   Final    NO GROWTH 5 DAYS Performed at [facility], [place] [address] [place], [place] [address]    Report Status 07/29/2020 FINAL  Final  Culture, blood (routine x 2)     Status: None   Collection Time: 07/24/20  4:10 PM   Specimen: BLOOD LEFT HAND  Result Value Ref Range Status   Specimen Description BLOOD LEFT HAND  Final   Special Requests   Final    BOTTLES DRAWN AEROBIC AND ANAEROBIC Blood Culture adequate volume   Culture   Final    NO GROWTH 5 DAYS  Performed at [facility], [place] [address], [place], [place] [address]    Report Status 07/29/2020 FINAL  Final  MRSA Next Gen by PCR, Nasal     Status: None    Collection Time: 08/23/20  4:17 PM   Specimen: Nasal Mucosa; Nasal Swab  Result Value Ref Range Status   MRSA by PCR Next Gen NOT DETECTED NOT DETECTED Final    Comment: (NOTE) The GeneXpert MRSA Assay (FDA approved for NASAL specimens only), is one component of a comprehensive MRSA colonization surveillance program. It is not intended to diagnose MRSA infection nor to guide or monitor treatment for MRSA infections. Test performance is not FDA approved in patients less than 23 years old. Performed at [facility], [place] [address], [place], [place] [address]   Culture, blood (routine x 2)     Status: None   Collection Time: 08/26/20  9:32 AM   Specimen: BLOOD RIGHT HAND  Result Value Ref Range Status   Specimen Description BLOOD RIGHT HAND  Final   Special Requests   Final    BOTTLES DRAWN AEROBIC ONLY Blood Culture results may not be optimal due to an inadequate volume of blood received in culture bottles   Culture   Final    NO GROWTH 5 DAYS Performed at [facility], [place] [address], [place], [place] [address]    Report Status 08/31/2020 FINAL  Final  Culture, blood (routine x 2)     Status: Abnormal   Collection Time: 08/26/20  9:38 AM   Specimen: BLOOD LEFT HAND  Result Value Ref Range Status   Specimen Description BLOOD LEFT HAND  Final   Special Requests   Final    BOTTLES DRAWN AEROBIC AND ANAEROBIC Blood Culture adequate volume   Culture  Setup Time   Final    GRAM POSITIVE COCCI IN CLUSTERS AEROBIC BOTTLE ONLY CRITICAL RESULT CALLED TO, READ BACK BY AND VERIFIED WITH: PHARM D A.LAWLESS ON [id number] AT 0945 BY E.PARRISH    Culture (A)  Final    STAPHYLOCOCCUS EPIDERMIDIS THE SIGNIFICANCE OF ISOLATING THIS ORGANISM FROM A SINGLE SET OF BLOOD CULTURES WHEN MULTIPLE SETS ARE DRAWN IS UNCERTAIN. PLEASE NOTIFY THE MICROBIOLOGY DEPARTMENT WITHIN ONE WEEK IF SPECIATION AND SENSITIVITIES ARE REQUIRED. Performed at [facility], [place] [address] [place],  [place] [address]    Report Status 08/29/2020 FINAL  Final  Culture, Respiratory w Gram Stain     Status: None   Collection Time: 08/27/20  9:15 AM   Specimen: Tracheal Aspirate; Respiratory  Result Value Ref Range Status   Specimen Description TRACHEAL ASPIRATE  Final   Special Requests NONE  Final   Gram Stain   Final    ABUNDANT WBC PRESENT, PREDOMINANTLY PMN FEW GRAM POSITIVE COCCI FEW GRAM NEGATIVE RODS RARE GRAM POSITIVE RODS    Culture   Final    ABUNDANT ACINETOBACTER BAUMANNII MULTI-DRUG RESISTANT ORGANISM CRITICAL RESULT CALLED TO, READ BACK BY AND VERIFIED WITH: PHARMD K MCCLURE [id number] AT 1101 AM BY CM Performed at [facility], [place] [address] [place], [place] [address]    Report Status 08/30/2020 FINAL  Final   Organism ID, Bacteria ACINETOBACTER BAUMANNII  Final      Susceptibility   Acinetobacter baumannii - MIC*    CEFTAZIDIME 16 INTERMEDIATE Intermediate     CIPROFLOXACIN >=4 RESISTANT Resistant     GENTAMICIN >=16 RESISTANT Resistant     IMIPENEM >=16 RESISTANT Resistant  PIP/TAZO >=128 RESISTANT Resistant     TRIMETH/SULFA >=320 RESISTANT Resistant     AMPICILLIN/SULBACTAM 4 SENSITIVE Sensitive     * ABUNDANT ACINETOBACTER BAUMANNII  Surgical PCR screen     Status: None   Collection Time: 09/17/2020  9:25 AM   Specimen: Nasal Mucosa; Nasal Swab  Result Value Ref Range Status   MRSA, PCR NEGATIVE NEGATIVE Final   Staphylococcus aureus NEGATIVE NEGATIVE Final    Comment: (NOTE) The Xpert SA Assay (FDA approved for NASAL specimens in patients 71 years of age and older), is one component of a comprehensive surveillance program. It is not intended to diagnose infection nor to guide or monitor treatment. Performed at [facility], [place] [address] [place], [place] [address]   Gastrointestinal Panel by PCR , Stool     Status: None   Collection Time: 09/28/20  1:16 PM   Specimen: Stool  Result Value Ref Range Status   Campylobacter species NOT  DETECTED NOT DETECTED Final   Plesimonas shigelloides NOT DETECTED NOT DETECTED Final   Salmonella species NOT DETECTED NOT DETECTED Final   Yersinia enterocolitica NOT DETECTED NOT DETECTED Final   Vibrio species NOT DETECTED NOT DETECTED Final   Vibrio cholerae NOT DETECTED NOT DETECTED Final   Enteroaggregative E coli (EAEC) NOT DETECTED NOT DETECTED Final   Enteropathogenic E coli (EPEC) NOT DETECTED NOT DETECTED Final   Enterotoxigenic E coli (ETEC) NOT DETECTED NOT DETECTED Final   Shiga like toxin producing E coli (STEC) NOT DETECTED NOT DETECTED Final   Shigella/Enteroinvasive E coli (EIEC) NOT DETECTED NOT DETECTED Final   Cryptosporidium NOT DETECTED NOT DETECTED Final   Cyclospora cayetanensis NOT DETECTED NOT DETECTED Final   Entamoeba histolytica NOT DETECTED NOT DETECTED Final   Giardia lamblia NOT DETECTED NOT DETECTED Final   Adenovirus F40/41 NOT DETECTED NOT DETECTED Final   Astrovirus NOT DETECTED NOT DETECTED Final   Norovirus GI/GII NOT DETECTED NOT DETECTED Final   Rotavirus A NOT DETECTED NOT DETECTED Final   Sapovirus (I, II, IV, and V) NOT DETECTED NOT DETECTED Final    Comment: Performed at [facility], [place] [place], [place] [address]    Coagulation Studies: No results for input(s): LABPROT, INR in the last 72 hours.  Urinalysis: No results for input(s): COLORURINE, LABSPEC, PHURINE, GLUCOSEU, HGBUR, BILIRUBINUR, KETONESUR, PROTEINUR, UROBILINOGEN, NITRITE, LEUKOCYTESUR in the last 72 hours.  Invalid input(s): APPERANCEUR    Imaging: No results found.   Medications:    sodium chloride 10 mL/hr at 09/17/2020 1429   sodium chloride 500 mL (09/01/20 1408)   sodium chloride     sodium chloride     sodium chloride     sodium chloride     feeding supplement (NEPRO CARB STEADY) 1,000 mL (10/02/20 1248)    amiodarone  200 mg Per Tube Daily   apixaban  5 mg Per Tube BID   buPROPion  75 mg Per Tube BID   chlorhexidine gluconate  (MEDLINE KIT)  15 mL Mouth Rinse BID   Chlorhexidine Gluconate Cloth  6 each Topical Q0600   darbepoetin (ARANESP) injection - DIALYSIS  100 mcg Intravenous Q Tue-HD   feeding supplement (PROSource TF)  45 mL Per Tube TID   guaiFENesin  5 mL Per Tube BID   insulin aspart  0-9 Units Subcutaneous Q4H   insulin glargine-yfgn  30 Units Subcutaneous Daily   lidocaine  1 patch Transdermal Q24H   mouth rinse  15 mL Mouth Rinse 10 times  per day   metaxalone  800 mg Per Tube TID   midodrine  5 mg Per Tube TID   multivitamin  1 tablet Per Tube QHS   pantoprazole sodium  40 mg Per Tube Daily   sertraline  100 mg Per Tube Daily   simethicone  40 mg Per Tube QID   sodium chloride flush  10-40 mL Intracatheter Q12H   sodium chloride, sodium chloride, sodium chloride, sodium chloride, sodium chloride, alteplase, glycopyrrolate, heparin, heparin, heparin sodium (porcine), ipratropium-albuterol, lidocaine (PF), lidocaine-prilocaine, morphine injection, oxyCODONE, pentafluoroprop-tetrafluoroeth, phenol, polyethylene glycol, sodium chloride flush  Assessment/ Plan:  1.Acute kidney Injury-prolonged dialysis dependency, now progressed to ESRD:  ATN from septic shock-without any evidence of renal recovery to date . initiated on CRRT 07/18/20 and transitioned to IHD.  Has [facility] & AVG, continue with HD on TTS schedule for now.  Next dialysis treatment will be 10/04/2020    2 Vascular access - had left forearm AVG placed 08/30/2020 but unfortunately had clotted.  s/p thrombectomy and revision 9/2, graft can be used 4 weeks thereafter.  Swelling of left upper extremity noted continue arm elevation and supportive care.  Continue to follow  3 Septic shock/ventilator associated pneumonia due to recurrent Acinetobacter infection: Off antibiotics and stable hemodynamics.   4 Leukocytosis -persistent, will defer to primary service for further mgmt   5 Chronic respiratory failure: Status post tracheostomy with ventilator  management per CCM.   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   7 Chronic kidney disease/metabolic bone disease: Calcium and phosphorus level under control with hemodialysis, off binders   8 Anemia: contiue ESA, monitor Hb.  Transfuse as needed.  9 Acute metabolic encephalopathy: Continue management as above.,  Supportive care.   10 Disposition - pt cannot return to [facility] now that he is ESRD. He is going to [facility] in [place], [place]. He needs to have [place] Medicaid & his AVG needs to be mature prior to being accepted. Other possible barrier is pt is a permanent trach & vent pt.    LOS: [place] _0 _1 :33 AM

## 2020-10-03 NOTE — Progress Notes (Addendum)
11:40am: CSW spoke with Luis Orr at bedside and obtained bank statements. Copies were made of documents and faxed to Luis Orr at [facility] were returned to Luis Orr. Luis Orr did not have questions or concerns to address - CSW encouraged Luis Orr to reach out for assistance if needs arise, she was agreeable.  8:40am: CSW spoke with Luis Orr of [facility] who states he has not received bank statements from the patient's wife.  CSW spoke with patient's [name] who states she is on her way to the hospital at this time and expects to be here about 11am - CSW will meet with wife at bedside upon her arrival.  Madilyn Fireman, MSW, LCSW Transitions of Care  Clinical Social Worker II [phone]

## 2020-10-03 NOTE — Progress Notes (Addendum)
[facility] PROGRESS NOTE  Luis Orr HEN:[id number] DOB: 1950/05/26 DOA: 07/12/2020 PCP: Townsend Roger, MD  Status: Remains inpatient appropriate because:Hemodynamically unstable, Persistent severe electrolyte disturbances, Unsafe d/c plan, and Inpatient level of care appropriate due to severity of illness  Dispo: The patient is from: [facility] vent capable              Anticipated d/c is to: SNF Vent capable w/ access to in facility/stretcher based HD-a facility in [place] has been located in preauthorization in progress.              Patient currently is not medically stable to d/c.   Difficult to place patient Yes    Level of care: ICU  Code Status: Partial (no CPR and no defibrillation or cardioversion) Family Communication: Wife June at bedside on 9/21 DVT prophylaxis: IV Heparin COVID vaccination status: Unknown    HPI:  70 y.o. 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 Patient presented secondary to altered mental status and hypotension with recurrent pneumonia requiring vasopressor support and ICU admission.he has had multiple recent admissions for sepsis secondary to various infections including UTIs, pneumonias and bacteremia in setting of MDR Klebsiella, MRSA and pseudomonas. Most recent admission September 2021 in setting of sepsis due to MDR Klebsiella with right ureteral calculus s/p double J ureteral stent placement and treated with meropenem x 10days. He was discharged back to [facility].  ICU significant events: 6/29 admitted to ICU for septic shock on levo to maintain MAP>65, continued on vent support 6/29 Blood Cx>> staph species in 1 of 4 cultures drawn>> suspect contaminant 6/30 Off pressors 7/2 ID Consult for MDR acinetobacter in trach asp, Meropenem changed to unasyn 7/2 PRBC transfusion, iron replacement 7/5 Pressors added again. Central line placed. Drainage from gastrostomy  tube >> CT Abd without fluid collection around gastrostomy tube but with diffuse body wall edema. Echo EF 50-55%, mild LVH 7/6 Renal Replacement Therapy ; Code status changed to partial Code (DNR) 7/7 Transfuse 1U PRBC 7/9 add solu cortef 7/10 off pressors, weaning stress steroids 7/11 Pressors added back on again 7/12 ID consult due to rising WBC, hypothermia, hypotension concern for worsening infection. Vancomycin added. Repeat blood cx collected. 7/15 CRRT stopped 7/17 Restarted on low dose levo. No evidence of new infection. Held off on antibiotics. Nephrology contacted family and decision was made to continue iHD this week to allow additional time for renal recovery. Patient is not the best long term iHD candidate but family wants aggressive care 7/18 plans for iHD, tolerated with 2L removed 719 slight drop in hgb to 6.9 will transfuse 1 unit PRBC 7/22 > 7/26: Tolerating HD. TOC consulted for placement. Will likely need out of state placement. 8/15 WBCs greater than 28,000, hypoglycemia, procalcitonin >18, chest x-ray consistent with pneumonia, recent blood cultures consistent with contamination.  Unasyn IV resumed after sputum culture obtained 8/16 consultation placed with ethics committee regarding evaluation for futility of care 8/16 Sputum cx + for Acinetobacter with sensitivities pending 8/17 extensive conversation held with patient's wife regarding patient's poor prognosis and reemergence of Acinetobacter VAP.  Also discussed with her that patient has been able to convey to multiple staff that he does not want to continue on the ventilator or with dialysis.  Wife reports that the patient does not understand what he is saying, she is the POA and that she wants to continue aggressive care. 8/18 patient now telling nephrology, myself and the rest  of the staff he replies yes to wanting to remain on dialysis and on the ventilator and this direct question is asked. 8/18 Acinetobacter sensitive  to Unasyn only.  Discussed with ID/Dr. Juleen China.  Plan is to administer antibiotics for 7-day since patient has not had any increased O2 needs and remains afebrile.  If any concerns over worsening symptoms can give an additional 3 days. Developed moderate bleeding around tracheostomy site on 8/19.  Likely related to combination of Eliquis and heparin required for CRRT.  We will hold Eliquis for now and monitor degree of bleeding Eliquis resumed on 8/22.  Overnight into 8/23 patient bit his tongue and had bleeding.  Also had some bleeding around trach likely from oral bleeding.  Also though did have bleeding from old central line site. 8/23 had transient bleeding around trach and prior central line insertion site overnight.  Patient had bitten tongue and this likely explain the bleeding.  This morning patient was not having any bleeding.  On 8/25 had redeveloped slow ooze bleeding from mouth and trach.  Eliquis discontinued in favor of IV heparin especially given patient will undergo surgical procedure for vascular access on 8/29 8/30 Left FA AVG occluded 9/2 status post thrombectomy and revision of LUE AV graft 9/4 LUE edema with negative venous duplex. 9/5 heparin discontinued and Eliquis resumed    Subjective: Awakened.  Opens eyes to tactile stimulation.  Did not attempt to interact or engage during conversation except and I asked him that he enjoyed the video call with his wife June.  He did nod his head no when I asked him if his abdomen was still hurting.  Objective: Vitals:   10/03/20 0500 10/03/20 0600  BP: 112/69 111/70  Pulse: 100 99  Resp: 20 20  Temp:    SpO2: 100% 100%    Intake/Output Summary (Last 24 hours) at 10/03/2020 0725 Last data filed at 10/03/2020 0617 Gross per 24 hour  Intake 1665 ml  Output 2740 ml  Net -1075 ml   Filed Weights   10/02/20 0500 10/02/20 1800 10/03/20 0342  Weight: 80.7 kg 79.5 kg 79.9 kg    Exam:  Constitutional: Awake, flat  affect Respiratory: Trach: 6.0 XLT cuffed, Vent settings: PRVC mode, Fio2 40%, rate 20, VT 510, PEEP 5; Lungs remain coarse bilaterally overall clear.  Tolerating vent settings well.  Unable to wean Cardiovascular: SR w/o ectopy, normotensive, persistent bilateral UEs edema-AVG palpable thrill. Abdomen: LBM 9/19, PEG tube functional.  Left lower quadrant colostomy in place, abdomen distended but nontender with normoactive bowel sounds.  Neurologic: Chronic quadriplegia. No sensation below shoulders. Psychiatric: Awake, minimal eye contact.  Minimal interaction.  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 -initial respiratory symptoms secondary to combination Acinetobacter pneumonia and volume overload-Redeveloped Acinetobacter pneumonia (8/14) and completed 7 days of Unasyn-Ventilator management per PCCM  Recurrent ventilator associated pneumonia 2/2 recurrent Acinetobacter/sepsis without shock WBCs stable with positive sputum culture sensitive only to Unasyn-ID rec 7 days of therapy -completed    AKI with oliguria/volume overload/ New CKD requiring HD Anuric-Nephrology following, initially required CRRT and has transitioned to HD (due to staffing issues continues on CRRT at bedside)-LUE AVG functional-revised 9/2 but needs to mature x 4 weeks before can dc   Septic shock 2/2 VAP POA Presented with sepsis physiology and profound shock which has now resolved  Chronic thoracic back pain Continue Oxy and fentanyl prn; Skelaxin and lidocaine patch  History of paroxysmal atrial fibrillation Continue amiodarone and Eliquis maintaining SR QTc 420 ms on 9/93   Acute metabolic encephalopathy Resolved-Secondary to acute illness. CT head negative for acute process.     Diabetes mellitus, type 2 Continue Semglee 30 units daily.  Continue SSI w/ CBG checks every 4 hours  Acute  diverticulitis Has completed a course of Flagyl Continues to have residual pain WBC trending down currently 15.3 from a peak of 25.4  Nodular irregular thickening of the ascending colon-?  Neoplasm [facility] consulted.  They discussed with his wife.  Given patient's debilitated state as well as his apparent acute diverticulitis they recommend holding off on colonoscopy for now.  They have signed off for now would recommend calling for any questions.   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  Plan is to discharge to facility in [place]   Microcytic anemia/Acute anemia superimposed on anemia of chronic kidney disease S/P 5 units of PRBC -Continue Aranesp and follow Hgb prn   Severe protein calorie malnutrition PEG has been replaced and is functional Discontinue Cortark  Pressure injury/stage IV sacral decubitus Stage IV medial/posterior sacrum present on admission.  Right/posterior/lateral ankle unknown if present on admission.  DTI left ear, not present on admission. (For additional documentation see wound care notes) Patient recently underwent bedside debridement of sacral and heel wound.  No evidence of infection found.  Wounds now with red granular base.  Continue wound packing as prior.    Data Reviewed: Basic Metabolic Panel: Recent Labs  Lab 09/26/20 0952 09/28/20 0549 09/28/20 1315 09/29/20 0406 09/30/20 0457 10/01/20 0325 10/02/20 1256 10/03/20 0341  NA 127* 130*   < > 131* 133* 130* 129* 132*  K 4.2 2.7*   < > 4.9 3.3* 5.1 5.5* 3.5  CL 88* 89*   < > 93* 94* 94* 92* 94*  CO2 20* 25   < > _0 21* 25  GLUCOSE 122* 214*   < > 125* 124* 146* 185* 200*  BUN 119* 56*   < > 78* 17 59* 93* 44*  CREATININE 1.66* 0.98   < > 1.24 0.37* 1.00 1.39* 0.89  CALCIUM 9.3 8.7*   < > 8.8* 9.0 8.7* 9.2  8.9  MG  --  2.1  --   --   --  2.1  --   --   PHOS 6.7*  --   --   --   --   --  7.3*  --    < > = values in this interval not displayed.   Liver Function Tests: Recent Labs  Lab 09/26/20 0952 10/02/20 1256  ALBUMIN 1.7* 1.7*    CBC: Recent Labs  Lab 09/30/20 0457 10/01/20 0325 10/01/20 1035 10/02/20 1256 10/03/20 0341  WBC 18.7* 17.4* 15.3* 21.9* 25.6*  HGB 9.7* 7.9* 7.5* 8.3* 8.1*  HCT 29.9* 25.4* 24.1* 26.0* 25.9*  MCV 76.7* 78.6* 78.5* 78.3* 78.2*  PLT 503* 459* 431* 450* 442*   CBG: Recent Labs  Lab 10/02/20 1103 10/02/20 1531 10/02/20 1939 10/02/20 2337 10/03/20 0328  GLUCAP 161* 134* 179* 186* 184*      Scheduled Meds:  amiodarone  200 mg Per Tube Daily   apixaban  5 mg Per Tube BID   buPROPion  75 mg Per Tube BID   chlorhexidine gluconate (MEDLINE KIT)  15 mL Mouth Rinse BID   Chlorhexidine Gluconate  Cloth  6 each Topical Q0600   darbepoetin (ARANESP) injection - DIALYSIS  100 mcg Intravenous Q Tue-HD   feeding supplement (PROSource TF)  45 mL Per Tube TID   guaiFENesin  5 mL Per Tube BID   insulin aspart  0-9 Units Subcutaneous Q4H   insulin glargine-yfgn  30 Units Subcutaneous Daily   lidocaine  1 patch Transdermal Q24H   mouth rinse  15 mL Mouth Rinse 10 times per day   metaxalone  800 mg Per Tube TID   midodrine  5 mg Per Tube TID   multivitamin  1 tablet Per Tube QHS   pantoprazole sodium  40 mg Per Tube Daily   sertraline  100 mg Per Tube Daily   simethicone  40 mg Per Tube QID   sodium chloride flush  10-40 mL Intracatheter Q12H   Continuous Infusions:  sodium chloride 10 mL/hr at 10/07/2020 1429   sodium chloride 500 mL (09/01/20 1408)   sodium chloride     sodium chloride     sodium chloride     sodium chloride     feeding supplement (NEPRO CARB STEADY) 1,000 mL (10/02/20 1248)    Principal Problem:   Acute respiratory failure with hypoxia (HCC) Active Problems:   Ventilator dependent (HCC)   Septic shock (HCC)   Pressure  injury of skin   AKI (acute kidney injury) ([place])   Hypotension   Goals of care, counseling/discussion   Quadriplegia ([place])   Chronic kidney disease requiring chronic dialysis ([place])   Hyperglycemia   Sepsis due to Acinetobacter baumannii ([place])   Pneumonia due to Acinetobacter species ([place])   Dysautonomia ([place])   Chronic midline thoracic back pain   Abdominal distention   Leukocytosis   Consultants: PCCM Nephrology Infectious disease Palliative care medicine  Procedures: Echocardiogram Insertion of double-lumen HD catheter  Antibiotics: Cefepime x1 dose Meropenem 6/29 through 7/2 Vancomycin 6/29 through 6/30 Unasyn 7/2 through 7/15 Unasyn 8/15 >8/22   Time spent: 35 minutes    Erin Hearing ANP  [facility] 7 am - 330 pm/M-F for direct patient care and secure chat Please refer to Amion for contact info 84  days

## 2020-10-04 DIAGNOSIS — J9601 Acute respiratory failure with hypoxia: Secondary | ICD-10-CM | POA: Diagnosis not present

## 2020-10-04 LAB — COMPREHENSIVE METABOLIC PANEL
ALT: 16 U/L (ref 0–44)
AST: 24 U/L (ref 15–41)
Albumin: 1.7 g/dL — ABNORMAL LOW (ref 3.5–5.0)
Alkaline Phosphatase: 169 U/L — ABNORMAL HIGH (ref 38–126)
Anion gap: 13 (ref 5–15)
BUN: 74 mg/dL — ABNORMAL HIGH (ref 8–23)
CO2: 24 mmol/L (ref 22–32)
Calcium: 9.1 mg/dL (ref 8.9–10.3)
Chloride: 93 mmol/L — ABNORMAL LOW (ref 98–111)
Creatinine, Ser: 1.25 mg/dL — ABNORMAL HIGH (ref 0.61–1.24)
GFR, Estimated: >60 mL/min (ref >60)
Glucose, Bld: 141 mg/dL — ABNORMAL HIGH (ref 70–99)
Potassium: 3.7 mmol/L (ref 3.5–5.1)
Sodium: 130 mmol/L — ABNORMAL LOW (ref 135–145)
Total Bilirubin: 0.8 mg/dL (ref 0.3–1.2)
Total Protein: 7 g/dL (ref 6.5–8.1)

## 2020-10-04 LAB — CBC WITH DIFFERENTIAL/PLATELET
Abs Immature Granulocytes: 0.39 10*3/uL — ABNORMAL HIGH (ref 0.00–0.07)
Basophils Absolute: 0.1 10*3/uL (ref 0.0–0.1)
Basophils Relative: 1 %
Eosinophils Absolute: 0.1 10*3/uL (ref 0.0–0.5)
Eosinophils Relative: 1 %
HCT: 24 % — ABNORMAL LOW (ref 39.0–52.0)
Hemoglobin: 7.7 g/dL — ABNORMAL LOW (ref 13.0–17.0)
Immature Granulocytes: 2 %
Lymphocytes Relative: 10 %
Lymphs Abs: 2.1 10*3/uL (ref 0.7–4.0)
MCH: 24.7 pg — ABNORMAL LOW (ref 26.0–34.0)
MCHC: 32.1 g/dL (ref 30.0–36.0)
MCV: 76.9 fL — ABNORMAL LOW (ref 80.0–100.0)
Monocytes Absolute: 1.8 10*3/uL — ABNORMAL HIGH (ref 0.1–1.0)
Monocytes Relative: 8 %
Neutro Abs: 16.7 10*3/uL — ABNORMAL HIGH (ref 1.7–7.7)
Neutrophils Relative %: 78 %
Platelets: 413 10*3/uL — ABNORMAL HIGH (ref 150–400)
RBC: 3.12 MIL/uL — ABNORMAL LOW (ref 4.22–5.81)
RDW: 19.9 % — ABNORMAL HIGH (ref 11.5–15.5)
WBC: 21.2 10*3/uL — ABNORMAL HIGH (ref 4.0–10.5)
nRBC: 0.7 % — ABNORMAL HIGH (ref 0.0–0.2)

## 2020-10-04 LAB — GLUCOSE, CAPILLARY
Glucose-Capillary: 131 mg/dL — ABNORMAL HIGH (ref 70–99)
Glucose-Capillary: 128 mg/dL — ABNORMAL HIGH (ref 70–99)
Glucose-Capillary: 137 mg/dL — ABNORMAL HIGH (ref 70–99)
Glucose-Capillary: 130 mg/dL — ABNORMAL HIGH (ref 70–99)
Glucose-Capillary: 127 mg/dL — ABNORMAL HIGH (ref 70–99)

## 2020-10-04 LAB — MAGNESIUM: Magnesium: 2.2 mg/dL (ref 1.7–2.4)

## 2020-10-04 MED ORDER — OXYCODONE HCL 5 MG/5ML PO SOLN
5.0000 mg | Freq: Four times a day (QID) | ORAL | Status: DC
  Administered 2020-10-04 – 2020-10-05 (×4): 5 mg
  Filled 2020-10-04 (×5): qty 5

## 2020-10-04 NOTE — Progress Notes (Signed)
Nutrition Follow-up  DOCUMENTATION CODES:   Not applicable  INTERVENTION:   Continue tube feeds via G-tube: Nepro at 45 ml/hr (1080 ml/day) ProSource TF 45 ml TID  Provides 2064 kcal, 120 grams of protein, and 785 ml free water daily.  Continue renal MVI daily per tube.  NUTRITION DIAGNOSIS:   Increased nutrient needs related to wound healing as evidenced by estimated needs.  Ongoing  GOAL:   Patient will meet greater than or equal to 90% of their needs  Met with TF  MONITOR:   Vent status, Labs, Weight trends, TF tolerance, Skin, I & O's  REASON FOR ASSESSMENT:   Ventilator, Consult Enteral/tube feeding initiation and management  ASSESSMENT:   70 year old male who presented to the ED on 6/29 from [facility] with AMS and hypotension. 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 Pt admitted with septic shock, acute on chronic hypoxemic respiratory failure.  G-tube in place. Patient continues to tolerate Nepro at 45 ml/h with Prosource TF 45 ml TID.   9/2 S/P redo LUE AV graft, awaiting on it to mature (9/30) prior to transferring to nursing facility in [place].  Currently receiving iHD on TTS schedule.  Last HD 9/20 with 2.5 L UF, for HD today.   Admit weight: 90.2 kg Current weight: 79.2 kg Lowest weight since admit: 72 kg on 8/3  Patient remains on ventilator support via trach MV: 10 L/min Temp (24hrs), Avg:98.4 F (36.9 C), Min:97.9 F (36.6 C), Max:98.9 F (37.2 C)  Medications reviewed and include: aranesp weekly, Novolog SSI q 4 hours, semglee 30 units daily, rena-vit, protonix.  Labs reviewed: Na 130 CBG: [phone]  Colostomy: 250 ml x 24 hours UOP: 105 ml x 24 hours I/O +18.9 L since admission  Diet Order:   Diet Order     None       EDUCATION NEEDS:   No education  needs have been identified at this time  Skin:  Skin Assessment: Skin Integrity Issues: DTI: L ear Stage IV: sacrum Stage II: R heel  Last BM:  9/22 colostomy  Height:   Ht Readings from Last 1 Encounters:  09/24/20 5' 6"  (1.676 m)    Weight:   Wt Readings from Last 1 Encounters:  10/04/20 79.2 kg    Ideal Body Weight:  58 kg (adjusted for quadriplegia)  BMI:  Body mass index is 28.18 kg/m.  Estimated Nutritional Needs:   Kcal:  1900-2100  Protein:  110-120 gm  Fluid:  1 L + UOP   Lucas Mallow, RD, LDN, CNSC Please refer to Amion for contact information.

## 2020-10-04 NOTE — Progress Notes (Signed)
[facility] PROGRESS NOTE  Luis Orr HEN:[id number] DOB: 1950/05/26 DOA: 07/05/2020 PCP: Townsend Roger, MD  Status: Remains inpatient appropriate because:Hemodynamically unstable, Persistent severe electrolyte disturbances, Unsafe d/c plan, and Inpatient level of care appropriate due to severity of illness  Dispo: The patient is from: [facility] vent capable              Anticipated d/c is to: SNF Vent capable w/ access to in facility/stretcher based HD-a facility in [place] has been located in preauthorization in progress.              Patient currently is not medically stable to d/c.   Difficult to place patient Yes    Level of care: ICU  Code Status: Partial (no CPR and no defibrillation or cardioversion) Family Communication: [name] at bedside on 9/21 DVT prophylaxis: IV Heparin COVID vaccination status: Unknown    HPI:  70 y.o. 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 Patient presented secondary to altered mental status and hypotension with recurrent pneumonia requiring vasopressor support and ICU admission.he has had multiple recent admissions for sepsis secondary to various infections including UTIs, pneumonias and bacteremia in setting of MDR Klebsiella, MRSA and pseudomonas. Most recent admission September 2021 in setting of sepsis due to MDR Klebsiella with right ureteral calculus s/p double J ureteral stent placement and treated with meropenem x 10days. He was discharged back to [facility].  ICU significant events: 6/29 admitted to ICU for septic shock on levo to maintain MAP>65, continued on vent support 6/29 Blood Cx>> staph species in 1 of 4 cultures drawn>> suspect contaminant 6/30 Off pressors 7/2 ID Consult for MDR acinetobacter in trach asp, Meropenem changed to unasyn 7/2 PRBC transfusion, iron replacement 7/5 Pressors added again. Central line placed. Drainage from gastrostomy  tube >> CT Abd without fluid collection around gastrostomy tube but with diffuse body wall edema. Echo EF 50-55%, mild LVH 7/6 Renal Replacement Therapy ; Code status changed to partial Code (DNR) 7/7 Transfuse 1U PRBC 7/9 add solu cortef 7/10 off pressors, weaning stress steroids 7/11 Pressors added back on again 7/12 ID consult due to rising WBC, hypothermia, hypotension concern for worsening infection. Vancomycin added. Repeat blood cx collected. 7/15 CRRT stopped 7/17 Restarted on low dose levo. No evidence of new infection. Held off on antibiotics. Nephrology contacted family and decision was made to continue iHD this week to allow additional time for renal recovery. Patient is not the best long term iHD candidate but family wants aggressive care 7/18 plans for iHD, tolerated with 2L removed 719 slight drop in hgb to 6.9 will transfuse 1 unit PRBC 7/22 > 7/26: Tolerating HD. TOC consulted for placement. Will likely need out of state placement. 8/15 WBCs greater than 28,000, hypoglycemia, procalcitonin >18, chest x-ray consistent with pneumonia, recent blood cultures consistent with contamination.  Unasyn IV resumed after sputum culture obtained 8/16 consultation placed with ethics committee regarding evaluation for futility of care 8/16 Sputum cx + for Acinetobacter with sensitivities pending 8/17 extensive conversation held with patient's wife regarding patient's poor prognosis and reemergence of Acinetobacter VAP.  Also discussed with her that patient has been able to convey to multiple staff that he does not want to continue on the ventilator or with dialysis.  Wife reports that the patient does not understand what he is saying, she is the POA and that she wants to continue aggressive care. 8/18 patient now telling nephrology, myself and the rest  of the staff he replies yes to wanting to remain on dialysis and on the ventilator and this direct question is asked. 8/18 Acinetobacter sensitive  to Unasyn only.  Discussed with ID/Dr. Juleen China.  Plan is to administer antibiotics for 7-day since patient has not had any increased O2 needs and remains afebrile.  If any concerns over worsening symptoms can give an additional 3 days. Developed moderate bleeding around tracheostomy site on 8/19.  Likely related to combination of Eliquis and heparin required for CRRT.  We will hold Eliquis for now and monitor degree of bleeding Eliquis resumed on 8/22.  Overnight into 8/23 patient bit his tongue and had bleeding.  Also had some bleeding around trach likely from oral bleeding.  Also though did have bleeding from old central line site. 8/23 had transient bleeding around trach and prior central line insertion site overnight.  Patient had bitten tongue and this likely explain the bleeding.  This morning patient was not having any bleeding.  On 8/25 had redeveloped slow ooze bleeding from mouth and trach.  Eliquis discontinued in favor of IV heparin especially given patient will undergo surgical procedure for vascular access on 8/29 8/30 Left FA AVG occluded 9/2 status post thrombectomy and revision of LUE AV graft 9/4 LUE edema with negative venous duplex. 9/5 heparin discontinued and Eliquis resumed    Subjective: Awake.  Did not smile when I asked him how his in person visit with his [name] went yesterday.  It appeared that he began to cry a little.  He is otherwise very withdrawn.  Objective: Vitals:   10/04/20 0700 10/04/20 0735  BP: 131/71 131/71  Pulse: 92 96  Resp: 20 20  Temp:    SpO2: 98% 97%    Intake/Output Summary (Last 24 hours) at 10/04/2020 0741 Last data filed at 10/04/2020 0700 Gross per 24 hour  Intake 1100 ml  Output 355 ml  Net 745 ml   Filed Weights   10/02/20 1800 10/03/20 0342 10/04/20 0500  Weight: 79.5 kg 79.9 kg 79.2 kg    Exam:  Constitutional: Awake, flat affect Respiratory: Trach: 6.0 XLT cuffed, Vent settings: PRVC mode, Fio2 40%, rate 20, VT 510, PEEP  5; bilateral rhonchi, somewhat diminished at the bases.  Foamy secretions around trach insertion site. Cardiovascular: Maintaining sinus rhythm and no apparent ectopy, remains normotensive on midodrine, continues with pitting bilateral upper extremity edema that is stable AVG palpable thrill. Abdomen: LBM 9/22, PEG tube,  Left lower quadrant colostomy in place, abdomen nontender with normoactive bowel sounds.  Neurologic: Chronic quadriplegia. No sensation below shoulders. Psychiatric: Awake, minimal eye contact.  Minimal interaction.  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 -initial respiratory symptoms secondary to combination Acinetobacter pneumonia and volume overload-Redeveloped Acinetobacter pneumonia (8/14) and completed 7 days of Unasyn-Ventilator management per PCCM  Recurrent ventilator associated pneumonia 2/2 recurrent Acinetobacter/sepsis without shock WBCs stable with positive sputum culture sensitive only to Unasyn-ID rec 7 days of therapy -completed    AKI with oliguria/volume overload/ New CKD requiring HD Anuric-Nephrology following, initially required CRRT and has transitioned to HD (due to staffing issues continues on CRRT at bedside)-LUE AVG functional-revised 9/2 but needs to mature x 4 weeks before can dc   Septic shock 2/2 VAP POA Presented with sepsis physiology and profound shock which has now resolved  Chronic thoracic back pain Continue IV morphine prn breakthrough pain; continue Skelaxin and lidocaine  patch Change Oxy IR to 5 mg scheduled every 6 hours  History of paroxysmal atrial fibrillation Continue amiodarone and Eliquis maintaining SR QTc 420 ms on 7/94   Acute metabolic encephalopathy Resolved-Secondary to acute illness. CT head negative for acute process.   Diabetes mellitus, type 2 Continue Semglee 30 units daily.   Continue SSI w/ CBG checks every 4 hours  Acute diverticulitis Has completed a course of Flagyl Continues to have residual pain WBC trended down currently 15.3 from a peak of 25.4 but has started climbing again with last 2 readings 25.6 and 21.2.  This is consistent with his chronic leukocytosis throughout the hospitalization.  Nodular irregular thickening of the ascending colon-?  Neoplasm [facility] consulted.  They discussed with his wife.  Given patient's debilitated state as well as his apparent acute diverticulitis they recommend holding off on colonoscopy for now.  They have signed off and recommend calling for any questions.   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  Plan is to discharge to facility in [place]   Microcytic anemia/Acute anemia superimposed on anemia of chronic kidney disease S/P 5 units of PRBC -Continue Aranesp and follow Hgb prn   Severe protein calorie malnutrition PEG has been replaced and is functional Discontinue Cortark  Pressure injury/stage IV sacral decubitus Stage IV medial/posterior sacrum present on admission.  Right/posterior/lateral ankle unknown if present on admission.  DTI left ear, not present on admission. (For additional documentation see wound care notes) Patient recently underwent bedside debridement of sacral and heel wound.  No evidence of infection found.  Wounds now with red granular base.  Continue wound packing as prior.    Data Reviewed: Basic Metabolic Panel: Recent Labs  Lab 09/28/20 0549 09/28/20 1315 09/30/20 0457 10/01/20 0325 10/02/20 1256 10/03/20 0341 10/04/20 0341  NA 130*   < > 133* 130* 129* 132* 130*  K 2.7*   < > 3.3* 5.1 5.5* 3.5 3.7  CL 89*   < > 94* 94* 92* 94* 93*  CO2 25   < > 26 22 21* 25 24  GLUCOSE 214*   < > 124* 146* 185*  200* 141*  BUN 56*   < > 17 59* 93* 44* 74*  CREATININE 0.98   < > 0.37* 1.00 1.39* 0.89 1.25*  CALCIUM 8.7*   < > 9.0 8.7* 9.2 8.9 9.1  MG 2.1  --   --  2.1  --   --  2.2  PHOS  --   --   --   --  7.3*  --   --    < > = values in this interval not displayed.   Liver Function Tests: Recent Labs  Lab 10/02/20 1256 10/04/20 0341  AST  --  24  ALT  --  16  ALKPHOS  --  169*  BILITOT  --  0.8  PROT  --  7.0  ALBUMIN 1.7* 1.7*    CBC: Recent Labs  Lab 10/01/20 0325 10/01/20 1035 10/02/20 1256 10/03/20 0341 10/04/20 0341  WBC 17.4* 15.3* 21.9* 25.6* 21.2*  NEUTROABS  --   --   --   --  16.7*  HGB 7.9* 7.5* 8.3* 8.1* 7.7*  HCT 25.4* 24.1* 26.0* 25.9* 24.0*  MCV 78.6* 78.5* 78.3* 78.2* 76.9*  PLT 459* 431* 450* 442* 413*   CBG: Recent Labs  Lab 10/03/20 1109 10/03/20 1535 10/03/20 1922  10/03/20 2326 10/04/20 0337  GLUCAP 165* 145* 138* 104* 131*      Scheduled Meds:  amiodarone  200 mg Per Tube Daily   apixaban  5 mg Per Tube BID   buPROPion  75 mg Per Tube BID   chlorhexidine gluconate (MEDLINE KIT)  15 mL Mouth Rinse BID   Chlorhexidine Gluconate Cloth  6 each Topical Q0600   Chlorhexidine Gluconate Cloth  6 each Topical Q0600   darbepoetin (ARANESP) injection - DIALYSIS  100 mcg Intravenous Q Tue-HD   feeding supplement (PROSource TF)  45 mL Per Tube TID   guaiFENesin  5 mL Per Tube BID   insulin aspart  0-9 Units Subcutaneous Q4H   insulin glargine-yfgn  30 Units Subcutaneous Daily   lidocaine  1 patch Transdermal Q24H   mouth rinse  15 mL Mouth Rinse 10 times per day   metaxalone  800 mg Per Tube TID   midodrine  5 mg Per Tube TID   multivitamin  1 tablet Per Tube QHS   pantoprazole sodium  40 mg Per Tube Daily   sertraline  100 mg Per Tube Daily   simethicone  40 mg Per Tube QID   sodium chloride flush  10-40 mL Intracatheter Q12H   Continuous Infusions:  sodium chloride 10 mL/hr at 10/04/2020 1429   sodium chloride 500 mL (09/01/20 1408)   sodium  chloride     sodium chloride     sodium chloride     sodium chloride     feeding supplement (NEPRO CARB STEADY) 1,000 mL (10/03/20 0924)    Principal Problem:   Acute respiratory failure with hypoxia (HCC) Active Problems:   Ventilator dependent (HCC)   Septic shock (HCC)   Pressure injury of skin   AKI (acute kidney injury) ([place])   Hypotension   Goals of care, counseling/discussion   Quadriplegia (HCC)   Chronic kidney disease requiring chronic dialysis ([place])   Hyperglycemia   Sepsis due to Acinetobacter baumannii ([place])   Pneumonia due to Acinetobacter species ([place])   Dysautonomia ([place])   Chronic midline thoracic back pain   Abdominal distention   Leukocytosis   Consultants: PCCM Nephrology Infectious disease Palliative care medicine  Procedures: Echocardiogram Insertion of double-lumen HD catheter  Antibiotics: Cefepime x1 dose Meropenem 6/29 through 7/2 Vancomycin 6/29 through 6/30 Unasyn 7/2 through 7/15 Unasyn 8/15 >8/22   Time spent: 25 minutes    Erin Hearing ANP  [facility] 7 am - 330 pm/M-F for direct patient care and secure chat Please refer to Amion for contact info 85  days

## 2020-10-04 NOTE — Progress Notes (Signed)
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  Presented from [facility] with altered mental status hypotension and pneumonia resultant septic shock is admitted to the intensive care unit.  Has had multiple admissions in the past for Klebsiella, MRSA and Pseudomonas urinary tract infections and pneumonia.  His most recent admission 10/03/2020 with multiple drug-resistant Klebsiella and right ureteral calculus requiring double-J stent.  Renal replacement therapy was initiated 07/18/2020.  Underwent successful dialysis 10/02/2020 with 2.5 L removed.  Next dialysis treatment 03/06/2020  Blood pressure 111/70 pulse 94 temperature 97.9 O2 sats 90% FiO2 40%  Sodium 130 potassium 3.7 chloride 93 CO2 24 BUN 34 creatinine 1.25 glucose 141 calcium 9.1 magnesium 2.2 albumin 1.7 hemoglobin 7.7  Minimal recorded urine output 10/02/2020   Objective:  Vital signs in last 24 hours:  Temp:  [97.9 F (36.6 C)-98.9 F (37.2 C)] 97.9 F (36.6 C) (09/22 0754) Pulse Rate:  [90-104] 96 (09/22 0735) Resp:  [20-23] 20 (09/22 0735) BP: (107-184)/(55-91) 131/71 (09/22 0735) SpO2:  [94 %-100 %] 97 % (09/22 0735) FiO2 (%):  [40 %] 40 % (09/22 0735) Weight:  [79.2 kg] 79.2 kg (09/22 0500)  Weight change: -0.3 kg Filed Weights   10/02/20 1800 10/03/20 0342 10/04/20 0500  Weight: 79.5 kg 79.9 kg 79.2 kg    Intake/Output: I/O last 3 completed shifts: In: 1765 [I.V.:40; Other:150; NG/GT:1575] Out: 365 [Urine:115; Stool:250]   Intake/Output this shift:  No intake/output data recorded.  General:NAD, tracheostomy on vent. Heart:RRR, s1s2 nl Lungs: trach, no increased work of breathing. Abdomen:soft, Non-tender, PEG tube and ostomy bag Extremities:Trace lower extremities edema Neuro: awake Dialysis Access: LIJ TDC, LUE AVG faint T/B+ (bruit w/  auscultation), swelling of the left upper extremity noted (stable)   Basic Metabolic Panel: Recent Labs  Lab 09/28/20 0549 09/28/20 1315 09/30/20 0457 10/01/20 0325 10/02/20 1256 10/03/20 0341 10/04/20 0341  NA 130*   < > 133* 130* 129* 132* 130*  K 2.7*   < > 3.3* 5.1 5.5* 3.5 3.7  CL 89*   < > 94* 94* 92* 94* 93*  CO2 25   < > 26 22 21* 25 24  GLUCOSE 214*   < > 124* 146* 185* 200* 141*  BUN 56*   < > 17 59* 93* 44* 74*  CREATININE 0.98   < > 0.37* 1.00 1.39* 0.89 1.25*  CALCIUM 8.7*   < > 9.0 8.7* 9.2 8.9 9.1  MG 2.1  --   --  2.1  --   --  2.2  PHOS  --   --   --   --  7.3*  --   --    < > = values in this interval not displayed.     Liver Function Tests: Recent Labs  Lab 10/02/20 1256 10/04/20 0341  AST  --  24  ALT  --  16  ALKPHOS  --  169*  BILITOT  --  0.8  PROT  --  7.0  ALBUMIN 1.7* 1.7*    No results for input(s): LIPASE, AMYLASE in the last 168 hours. No results for input(s): AMMONIA in the last 168 hours.  CBC: Recent Labs  Lab 10/01/20 0325 10/01/20 1035 10/02/20 1256 10/03/20 0341 10/04/20 0341  WBC 17.4* 15.3* 21.9* 25.6* 21.2*  NEUTROABS  --   --   --   --  16.7*  HGB  7.9* 7.5* 8.3* 8.1* 7.7*  HCT 25.4* 24.1* 26.0* 25.9* 24.0*  MCV 78.6* 78.5* 78.3* 78.2* 76.9*  PLT 459* 431* 450* 442* 413*     Cardiac Enzymes: No results for input(s): CKTOTAL, CKMB, CKMBINDEX, TROPONINI in the last 168 hours.  BNP: Invalid input(s): POCBNP  CBG: Recent Labs  Lab 10/03/20 1535 10/03/20 1922 10/03/20 2326 10/04/20 0337 10/04/20 0753  GLUCAP 145* 138* 104* 131* 128*     Microbiology: Results for orders placed or performed during the hospital encounter of 06/28/2020  Urine culture     Status: Abnormal   Collection Time: 07/03/2020  6:24 PM   Specimen: Urine, Random  Result Value Ref Range Status   Specimen Description URINE, RANDOM  Final   Special Requests   Final    NONE Performed at [facility], [place] [address],  [place], [place] [address]    Culture MULTIPLE SPECIES PRESENT, SUGGEST RECOLLECTION (A)  Final   Report Status 07/13/2020 FINAL  Final  Culture, blood (routine x 2)     Status: Abnormal   Collection Time: 06/13/2020  6:42 PM   Specimen: BLOOD LEFT FOREARM  Result Value Ref Range Status   Specimen Description BLOOD LEFT FOREARM  Final   Special Requests   Final    BOTTLES DRAWN AEROBIC AND ANAEROBIC Blood Culture results may not be optimal due to an inadequate volume of blood received in culture bottles   Culture  Setup Time   Final    ANAEROBIC BOTTLE ONLY GRAM POSITIVE COCCI CRITICAL RESULT CALLED TO, READ BACK BY AND VERIFIED WITH: G ABBOTT PHARMD 07/13/20 0207 JDW    Culture (A)  Final    STAPHYLOCOCCUS AURICULARIS THE SIGNIFICANCE OF ISOLATING THIS ORGANISM FROM A SINGLE SET OF BLOOD CULTURES WHEN MULTIPLE SETS ARE DRAWN IS UNCERTAIN. PLEASE NOTIFY THE MICROBIOLOGY DEPARTMENT WITHIN ONE WEEK IF SPECIATION AND SENSITIVITIES ARE REQUIRED. Performed at [facility], [place] [address], [place], [place] [address]    Report Status 07/14/2020 FINAL  Final  Blood Culture ID Panel (Reflexed)     Status: Abnormal   Collection Time: 06/19/2020  6:42 PM  Result Value Ref Range Status   Enterococcus faecalis NOT DETECTED NOT DETECTED Final   Enterococcus Faecium NOT DETECTED NOT DETECTED Final   Listeria monocytogenes NOT DETECTED NOT DETECTED Final   Staphylococcus species DETECTED (A) NOT DETECTED Final    Comment: CRITICAL RESULT CALLED TO, READ BACK BY AND VERIFIED WITH: G ABBOTT PHARMD 07/13/20 0207 JDW    Staphylococcus aureus (BCID) NOT DETECTED NOT DETECTED Final   Staphylococcus epidermidis NOT DETECTED NOT DETECTED Final   Staphylococcus lugdunensis NOT DETECTED NOT DETECTED Final   Streptococcus species NOT DETECTED NOT DETECTED Final   Streptococcus agalactiae NOT DETECTED NOT DETECTED Final   Streptococcus pneumoniae NOT DETECTED NOT DETECTED Final   Streptococcus pyogenes NOT DETECTED  NOT DETECTED Final   A.calcoaceticus-baumannii NOT DETECTED NOT DETECTED Final   Bacteroides fragilis NOT DETECTED NOT DETECTED Final   Enterobacterales NOT DETECTED NOT DETECTED Final   Enterobacter cloacae complex NOT DETECTED NOT DETECTED Final   Escherichia coli NOT DETECTED NOT DETECTED Final   Klebsiella aerogenes NOT DETECTED NOT DETECTED Final   Klebsiella oxytoca NOT DETECTED NOT DETECTED Final   Klebsiella pneumoniae NOT DETECTED NOT DETECTED Final   Proteus species NOT DETECTED NOT DETECTED Final   Salmonella species NOT DETECTED NOT DETECTED Final   Serratia marcescens NOT DETECTED NOT DETECTED Final   Haemophilus influenzae NOT DETECTED NOT  DETECTED Final   Neisseria meningitidis NOT DETECTED NOT DETECTED Final   Pseudomonas aeruginosa NOT DETECTED NOT DETECTED Final   Stenotrophomonas maltophilia NOT DETECTED NOT DETECTED Final   Candida albicans NOT DETECTED NOT DETECTED Final   Candida auris NOT DETECTED NOT DETECTED Final   Candida glabrata NOT DETECTED NOT DETECTED Final   Candida krusei NOT DETECTED NOT DETECTED Final   Candida parapsilosis NOT DETECTED NOT DETECTED Final   Candida tropicalis NOT DETECTED NOT DETECTED Final   Cryptococcus neoformans/gattii NOT DETECTED NOT DETECTED Final    Comment: Performed at [facility], [place] [address], [place], [place] [address]  Resp Panel by RT-PCR (Flu A&B, Covid) Nasopharyngeal Swab     Status: None   Collection Time: 07/07/2020  7:22 PM   Specimen: Nasopharyngeal Swab; Nasopharyngeal(NP) swabs in vial transport medium  Result Value Ref Range Status   SARS Coronavirus 2 by RT PCR NEGATIVE NEGATIVE Final    Comment: (NOTE) SARS-CoV-2 target nucleic acids are NOT DETECTED.  The SARS-CoV-2 RNA is generally detectable in upper respiratory specimens during the acute phase of infection. The lowest concentration of SARS-CoV-2 viral copies this assay can detect is 138 copies/mL. A negative result does not preclude  SARS-Cov-2 infection and should not be used as the sole basis for treatment or other patient management decisions. A negative result may occur with  improper specimen collection/handling, submission of specimen other than nasopharyngeal swab, presence of viral mutation(s) within the areas targeted by this assay, and inadequate number of viral copies(<138 copies/mL). A negative result must be combined with clinical observations, patient history, and epidemiological information. The expected result is Negative.  Fact Sheet for Patients:  EntrepreneurPulse.com.au  Fact Sheet for Healthcare Providers:  IncredibleEmployment.be  This test is no t yet approved or cleared by the [place] FDA and  has been authorized for detection and/or diagnosis of SARS-CoV-2 by FDA under an Emergency Use Authorization (EUA). This EUA will remain  in effect (meaning this test can be used) for the duration of the COVID-19 declaration under Section 564(b)(1) of the Act, 21 U.S.C.section 360bbb-3(b)(1), unless the authorization is terminated  or revoked sooner.       Influenza A by PCR NEGATIVE NEGATIVE Final   Influenza B by PCR NEGATIVE NEGATIVE Final    Comment: (NOTE) The Xpert Xpress SARS-CoV-2/FLU/RSV plus assay is intended as an aid in the diagnosis of influenza from Nasopharyngeal swab specimens and should not be used as a sole basis for treatment. Nasal washings and aspirates are unacceptable for Xpert Xpress SARS-CoV-2/FLU/RSV testing.  Fact Sheet for Patients: EntrepreneurPulse.com.au  Fact Sheet for Healthcare Providers: IncredibleEmployment.be  This test is not yet approved or cleared by the [place] FDA and has been authorized for detection and/or diagnosis of SARS-CoV-2 by FDA under an Emergency Use Authorization (EUA). This EUA will remain in effect (meaning this test can be used) for the duration of  the COVID-19 declaration under Section 564(b)(1) of the Act, 21 U.S.C. section 360bbb-3(b)(1), unless the authorization is terminated or revoked.  Performed at [facility], [place] [address], [place], [place] [address]   Culture, blood (routine x 2)     Status: None   Collection Time: 06/16/2020  7:23 PM   Specimen: BLOOD  Result Value Ref Range Status   Specimen Description BLOOD RIGHT ANTECUBITAL  Final   Special Requests   Final    BOTTLES DRAWN AEROBIC AND ANAEROBIC Blood Culture adequate volume   Culture   Final  NO GROWTH 5 DAYS Performed at [facility], [place] [address] [place], [place] [address]    Report Status 07/16/2020 FINAL  Final  MRSA Next Gen by PCR, Nasal     Status: Abnormal   Collection Time: 06/19/2020 10:29 PM   Specimen: Nasal Mucosa; Nasal Swab  Result Value Ref Range Status   MRSA by PCR Next Gen DETECTED (A) NOT DETECTED Final    Comment: RESULT CALLED TO, READ BACK BY AND VERIFIED WITH: Ayesha Rumpf RN 07/12/20 0428 JDW (NOTE) The GeneXpert MRSA Assay (FDA approved for NASAL specimens only), is one component of a comprehensive MRSA colonization surveillance program. It is not intended to diagnose MRSA infection nor to guide or monitor treatment for MRSA infections. Test performance is not FDA approved in patients less than 67 years old. Performed at [facility], [place] [address] [place], [place] [address]   Culture, Respiratory w Gram Stain     Status: None   Collection Time: 07/12/20  3:48 PM   Specimen: Tracheal Aspirate; Respiratory  Result Value Ref Range Status   Specimen Description TRACHEAL ASPIRATE  Final   Special Requests NONE  Final   Gram Stain   Final    MODERATE WBC PRESENT, PREDOMINANTLY PMN FEW GRAM NEGATIVE RODS FEW GRAM POSITIVE COCCI IN PAIRS    Culture   Final    MODERATE ACINETOBACTER BAUMANNII MULTI-DRUG RESISTANT ORGANISM RESULT CALLED TO, READ BACK BY AND VERIFIED WITH: RN J.BEABRAUT ON [id number] AT 1410 BY  E.PARRISH Performed at [facility], [place] [address], [place], [place] [address]    Report Status 07/14/2020 FINAL  Final   Organism ID, Bacteria ACINETOBACTER BAUMANNII  Final      Susceptibility   Acinetobacter baumannii - MIC*    CEFTAZIDIME 32 RESISTANT Resistant     CIPROFLOXACIN >=4 RESISTANT Resistant     GENTAMICIN >=16 RESISTANT Resistant     IMIPENEM >=16 RESISTANT Resistant     PIP/TAZO >=128 RESISTANT Resistant     TRIMETH/SULFA >=320 RESISTANT Resistant     AMPICILLIN/SULBACTAM 8 SENSITIVE Sensitive     * MODERATE ACINETOBACTER BAUMANNII  Culture, Urine     Status: Abnormal   Collection Time: 07/13/20 11:26 AM   Specimen: Urine, Random  Result Value Ref Range Status   Specimen Description URINE, RANDOM  Final   Special Requests   Final    NONE Performed at [facility], [place] [address], [place], [place] [address]    Culture MULTIPLE SPECIES PRESENT, SUGGEST RECOLLECTION (A)  Final   Report Status 07/14/2020 FINAL  Final  Culture, blood (routine x 2)     Status: None   Collection Time: 07/24/20  3:53 PM   Specimen: BLOOD RIGHT HAND  Result Value Ref Range Status   Specimen Description BLOOD RIGHT HAND  Final   Special Requests   Final    BOTTLES DRAWN AEROBIC AND ANAEROBIC Blood Culture results may not be optimal due to an inadequate volume of blood received in culture bottles   Culture   Final    NO GROWTH 5 DAYS Performed at [facility], [place] [address] [place], [place] [address]    Report Status 07/29/2020 FINAL  Final  Culture, blood (routine x 2)     Status: None   Collection Time: 07/24/20  4:10 PM   Specimen: BLOOD LEFT HAND  Result Value Ref Range Status   Specimen Description BLOOD LEFT HAND  Final   Special Requests  Final    BOTTLES DRAWN AEROBIC AND ANAEROBIC Blood Culture adequate volume   Culture   Final    NO GROWTH 5 DAYS Performed at [facility], [address], [place], [place] [address]    Report Status  07/29/2020 FINAL  Final  MRSA Next Gen by PCR, Nasal     Status: None   Collection Time: 08/23/20  4:17 PM   Specimen: Nasal Mucosa; Nasal Swab  Result Value Ref Range Status   MRSA by PCR Next Gen NOT DETECTED NOT DETECTED Final    Comment: (NOTE) The GeneXpert MRSA Assay (FDA approved for NASAL specimens only), is one component of a comprehensive MRSA colonization surveillance program. It is not intended to diagnose MRSA infection nor to guide or monitor treatment for MRSA infections. Test performance is not FDA approved in patients less than 25 years old. Performed at [facility], [address] [place], [place] [address]   Culture, blood (routine x 2)     Status: None   Collection Time: 08/26/20  9:32 AM   Specimen: BLOOD RIGHT HAND  Result Value Ref Range Status   Specimen Description BLOOD RIGHT HAND  Final   Special Requests   Final    BOTTLES DRAWN AEROBIC ONLY Blood Culture results may not be optimal due to an inadequate volume of blood received in culture bottles   Culture   Final    NO GROWTH 5 DAYS Performed at [facility], [address], [place], [place] [address]    Report Status 08/31/2020 FINAL  Final  Culture, blood (routine x 2)     Status: Abnormal   Collection Time: 08/26/20  9:38 AM   Specimen: BLOOD LEFT HAND  Result Value Ref Range Status   Specimen Description BLOOD LEFT HAND  Final   Special Requests   Final    BOTTLES DRAWN AEROBIC AND ANAEROBIC Blood Culture adequate volume   Culture  Setup Time   Final    GRAM POSITIVE COCCI IN CLUSTERS AEROBIC BOTTLE ONLY CRITICAL RESULT CALLED TO, READ BACK BY AND VERIFIED WITH: PHARM D A.LAWLESS ON [id number] AT 0945 BY E.PARRISH    Culture (A)  Final    STAPHYLOCOCCUS EPIDERMIDIS THE SIGNIFICANCE OF ISOLATING THIS ORGANISM FROM A SINGLE SET OF BLOOD CULTURES WHEN MULTIPLE SETS ARE DRAWN IS UNCERTAIN. PLEASE NOTIFY THE MICROBIOLOGY DEPARTMENT WITHIN ONE WEEK IF SPECIATION AND SENSITIVITIES ARE  REQUIRED. Performed at [facility], [address] [place], [place] [address]    Report Status 08/29/2020 FINAL  Final  Culture, Respiratory w Gram Stain     Status: None   Collection Time: 08/27/20  9:15 AM   Specimen: Tracheal Aspirate; Respiratory  Result Value Ref Range Status   Specimen Description TRACHEAL ASPIRATE  Final   Special Requests NONE  Final   Gram Stain   Final    ABUNDANT WBC PRESENT, PREDOMINANTLY PMN FEW GRAM POSITIVE COCCI FEW GRAM NEGATIVE RODS RARE GRAM POSITIVE RODS    Culture   Final    ABUNDANT ACINETOBACTER BAUMANNII MULTI-DRUG RESISTANT ORGANISM CRITICAL RESULT CALLED TO, READ BACK BY AND VERIFIED WITH: PHARMD K MCCLURE [id number] AT 1101 AM BY CM Performed at [facility], [address], [place], [place] [address]    Report Status 08/30/2020 FINAL  Final   Organism ID, Bacteria ACINETOBACTER BAUMANNII  Final      Susceptibility   Acinetobacter baumannii - MIC*    CEFTAZIDIME 16 INTERMEDIATE Intermediate  CIPROFLOXACIN >=4 RESISTANT Resistant     GENTAMICIN >=16 RESISTANT Resistant     IMIPENEM >=16 RESISTANT Resistant     PIP/TAZO >=128 RESISTANT Resistant     TRIMETH/SULFA >=320 RESISTANT Resistant     AMPICILLIN/SULBACTAM 4 SENSITIVE Sensitive     * ABUNDANT ACINETOBACTER BAUMANNII  Surgical PCR screen     Status: None   Collection Time: 09/26/2020  9:25 AM   Specimen: Nasal Mucosa; Nasal Swab  Result Value Ref Range Status   MRSA, PCR NEGATIVE NEGATIVE Final   Staphylococcus aureus NEGATIVE NEGATIVE Final    Comment: (NOTE) The Xpert SA Assay (FDA approved for NASAL specimens in patients 54 years of age and older), is one component of a comprehensive surveillance program. It is not intended to diagnose infection nor to guide or monitor treatment. Performed at [facility], [place] [address] [place], [place] [address]   Gastrointestinal Panel by PCR , Stool     Status: None   Collection Time: 09/28/20  1:16 PM    Specimen: Stool  Result Value Ref Range Status   Campylobacter species NOT DETECTED NOT DETECTED Final   Plesimonas shigelloides NOT DETECTED NOT DETECTED Final   Salmonella species NOT DETECTED NOT DETECTED Final   Yersinia enterocolitica NOT DETECTED NOT DETECTED Final   Vibrio species NOT DETECTED NOT DETECTED Final   Vibrio cholerae NOT DETECTED NOT DETECTED Final   Enteroaggregative E coli (EAEC) NOT DETECTED NOT DETECTED Final   Enteropathogenic E coli (EPEC) NOT DETECTED NOT DETECTED Final   Enterotoxigenic E coli (ETEC) NOT DETECTED NOT DETECTED Final   Shiga like toxin producing E coli (STEC) NOT DETECTED NOT DETECTED Final   Shigella/Enteroinvasive E coli (EIEC) NOT DETECTED NOT DETECTED Final   Cryptosporidium NOT DETECTED NOT DETECTED Final   Cyclospora cayetanensis NOT DETECTED NOT DETECTED Final   Entamoeba histolytica NOT DETECTED NOT DETECTED Final   Giardia lamblia NOT DETECTED NOT DETECTED Final   Adenovirus F40/41 NOT DETECTED NOT DETECTED Final   Astrovirus NOT DETECTED NOT DETECTED Final   Norovirus GI/GII NOT DETECTED NOT DETECTED Final   Rotavirus A NOT DETECTED NOT DETECTED Final   Sapovirus (I, II, IV, and V) NOT DETECTED NOT DETECTED Final    Comment: Performed at [facility], [place] [address] [place], [place] [address]    Coagulation Studies: No results for input(s): LABPROT, INR in the last 72 hours.  Urinalysis: No results for input(s): COLORURINE, LABSPEC, PHURINE, GLUCOSEU, HGBUR, BILIRUBINUR, KETONESUR, PROTEINUR, UROBILINOGEN, NITRITE, LEUKOCYTESUR in the last 72 hours.  Invalid input(s): APPERANCEUR    Imaging: No results found.   Medications:    sodium chloride 10 mL/hr at 10/08/2020 1429   sodium chloride 500 mL (09/01/20 1408)   sodium chloride     sodium chloride     sodium chloride     sodium chloride     feeding supplement (NEPRO CARB STEADY) 1,000 mL (10/03/20 0924)    amiodarone  200 mg Per Tube Daily   apixaban  5 mg  Per Tube BID   buPROPion  75 mg Per Tube BID   chlorhexidine gluconate (MEDLINE KIT)  15 mL Mouth Rinse BID   Chlorhexidine Gluconate Cloth  6 each Topical Q0600   Chlorhexidine Gluconate Cloth  6 each Topical Q0600   darbepoetin (ARANESP) injection - DIALYSIS  100 mcg Intravenous Q Tue-HD   feeding supplement (PROSource TF)  45 mL Per Tube TID   guaiFENesin  5 mL Per Tube BID   insulin  aspart  0-9 Units Subcutaneous Q4H   insulin glargine-yfgn  30 Units Subcutaneous Daily   lidocaine  1 patch Transdermal Q24H   mouth rinse  15 mL Mouth Rinse 10 times per day   metaxalone  800 mg Per Tube TID   midodrine  5 mg Per Tube TID   multivitamin  1 tablet Per Tube QHS   oxyCODONE  5 mg Per Tube Q6H   pantoprazole sodium  40 mg Per Tube Daily   sertraline  100 mg Per Tube Daily   simethicone  40 mg Per Tube QID   sodium chloride flush  10-40 mL Intracatheter Q12H   sodium chloride, sodium chloride, sodium chloride, sodium chloride, sodium chloride, alteplase, glycopyrrolate, heparin, heparin, heparin sodium (porcine), ipratropium-albuterol, lidocaine (PF), lidocaine-prilocaine, morphine injection, pentafluoroprop-tetrafluoroeth, phenol, polyethylene glycol, sodium chloride flush  Assessment/ Plan:  1.Acute kidney Injury-prolonged dialysis dependency, now progressed to ESRD:  ATN from septic shock-without any evidence of renal recovery to date . initiated on CRRT 07/18/20 and transitioned to IHD.  Has [facility] & AVG, continue with HD on TTS schedule for now.  Next dialysis treatment will be 10/04/2020    2 Vascular access - had left forearm AVG placed 09/02/2020 but unfortunately had clotted.  s/p thrombectomy and revision 9/2, graft can be used 4 weeks thereafter.  Swelling of left upper extremity noted continue arm elevation and supportive care.  Continue to follow  3 Septic shock/ventilator associated pneumonia due to recurrent Acinetobacter infection: Off antibiotics and stable hemodynamics.   4  Leukocytosis -persistent, will defer to primary service for further mgmt   5 Chronic respiratory failure: Status post tracheostomy with ventilator management per CCM.   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   7 Chronic kidney disease/metabolic bone disease: Calcium and phosphorus level under control with hemodialysis, off binders   8 Anemia: contiue ESA, monitor Hb.  Transfuse as needed.  9 Acute metabolic encephalopathy: Continue management as above.,  Supportive care.   10 Disposition - pt cannot return to [facility] now that he is ESRD. He is going to [facility] in [place], [place]. He needs to have [place] Medicaid & his AVG needs to be mature prior to being accepted. Other possible barrier is pt is a permanent trach & vent pt.    LOS: [place] _0 _1 :10 AM

## 2020-10-05 DIAGNOSIS — J9601 Acute respiratory failure with hypoxia: Secondary | ICD-10-CM | POA: Diagnosis not present

## 2020-10-05 LAB — CBC
HCT: 22.4 % — ABNORMAL LOW (ref 39.0–52.0)
Hemoglobin: 7.1 g/dL — ABNORMAL LOW (ref 13.0–17.0)
MCH: 24.4 pg — ABNORMAL LOW (ref 26.0–34.0)
MCHC: 31.7 g/dL (ref 30.0–36.0)
MCV: 77 fL — ABNORMAL LOW (ref 80.0–100.0)
Platelets: 434 10*3/uL — ABNORMAL HIGH (ref 150–400)
RBC: 2.91 MIL/uL — ABNORMAL LOW (ref 4.22–5.81)
RDW: 19.8 % — ABNORMAL HIGH (ref 11.5–15.5)
WBC: 18.4 10*3/uL — ABNORMAL HIGH (ref 4.0–10.5)
nRBC: 1.6 % — ABNORMAL HIGH (ref 0.0–0.2)

## 2020-10-05 LAB — GLUCOSE, CAPILLARY
Glucose-Capillary: 103 mg/dL — ABNORMAL HIGH (ref 70–99)
Glucose-Capillary: 117 mg/dL — ABNORMAL HIGH (ref 70–99)
Glucose-Capillary: 131 mg/dL — ABNORMAL HIGH (ref 70–99)
Glucose-Capillary: 153 mg/dL — ABNORMAL HIGH (ref 70–99)
Glucose-Capillary: 192 mg/dL — ABNORMAL HIGH (ref 70–99)
Glucose-Capillary: 205 mg/dL — ABNORMAL HIGH (ref 70–99)
Glucose-Capillary: 89 mg/dL (ref 70–99)

## 2020-10-05 LAB — RENAL FUNCTION PANEL
Albumin: 1.6 g/dL — ABNORMAL LOW (ref 3.5–5.0)
Anion gap: 14 (ref 5–15)
BUN: 96 mg/dL — ABNORMAL HIGH (ref 8–23)
CO2: 23 mmol/L (ref 22–32)
Calcium: 9.2 mg/dL (ref 8.9–10.3)
Chloride: 93 mmol/L — ABNORMAL LOW (ref 98–111)
Creatinine, Ser: 1.48 mg/dL — ABNORMAL HIGH (ref 0.61–1.24)
GFR, Estimated: 51 mL/min — ABNORMAL LOW (ref >60)
Glucose, Bld: 121 mg/dL — ABNORMAL HIGH (ref 70–99)
Phosphorus: 6.3 mg/dL — ABNORMAL HIGH (ref 2.5–4.6)
Potassium: 4.1 mmol/L (ref 3.5–5.1)
Sodium: 130 mmol/L — ABNORMAL LOW (ref 135–145)

## 2020-10-05 LAB — PREPARE RBC (CROSSMATCH)

## 2020-10-05 LAB — HEPATITIS B SURFACE ANTIBODY,QUALITATIVE: Hep B S Ab: NONREACTIVE

## 2020-10-05 LAB — HEPATITIS B SURFACE ANTIGEN: Hepatitis B Surface Ag: NONREACTIVE

## 2020-10-05 LAB — HEMOGLOBIN AND HEMATOCRIT, BLOOD
HCT: 26.4 % — ABNORMAL LOW (ref 39.0–52.0)
Hemoglobin: 8.6 g/dL — ABNORMAL LOW (ref 13.0–17.0)

## 2020-10-05 MED ORDER — SODIUM CHLORIDE 0.9% IV SOLUTION: Freq: Once | INTRAVENOUS | Status: AC

## 2020-10-05 MED ORDER — LIDOCAINE-PRILOCAINE 2.5-2.5 % EX CREA
1.0000 "application " | TOPICAL_CREAM | CUTANEOUS | Status: DC | PRN
  Filled 2020-10-05: qty 5

## 2020-10-05 MED ORDER — OXYCODONE HCL 5 MG/5ML PO SOLN
5.0000 mg | ORAL | Status: DC
  Administered 2020-10-05 – 2020-10-20 (×91): 5 mg
  Filled 2020-10-05 (×91): qty 5

## 2020-10-05 MED ORDER — MIDODRINE HCL 5 MG PO TABS: 2.5000 mg | ORAL_TABLET | ORAL | Status: DC

## 2020-10-05 MED ORDER — HEPARIN SODIUM (PORCINE) 1000 UNIT/ML DIALYSIS: 1000.0000 [IU] | INTRAMUSCULAR | Status: DC | PRN

## 2020-10-05 MED ORDER — MIDODRINE HCL 5 MG PO TABS: 5.0000 mg | ORAL_TABLET | Freq: Three times a day (TID) | ORAL | Status: DC | PRN

## 2020-10-05 MED ORDER — SODIUM CHLORIDE 0.9 % IV SOLN: 100.0000 mL | INTRAVENOUS | Status: DC | PRN

## 2020-10-05 MED ORDER — ALTEPLASE 2 MG IJ SOLR
2.0000 mg | Freq: Once | INTRAMUSCULAR | Status: DC | PRN
  Filled 2020-10-05: qty 2

## 2020-10-05 MED ORDER — MIDODRINE HCL 5 MG PO TABS
5.0000 mg | ORAL_TABLET | Freq: Three times a day (TID) | ORAL | Status: DC | PRN
  Administered 2020-10-07: 5 mg

## 2020-10-05 MED ORDER — LIDOCAINE HCL (PF) 1 % IJ SOLN: 5.0000 mL | INTRAMUSCULAR | Status: DC | PRN

## 2020-10-05 MED ORDER — PENTAFLUOROPROP-TETRAFLUOROETH EX AERO: 1.0000 "application " | INHALATION_SPRAY | CUTANEOUS | Status: DC | PRN

## 2020-10-05 MED ORDER — MIDODRINE HCL 5 MG PO TABS
2.5000 mg | ORAL_TABLET | Freq: Three times a day (TID) | ORAL | Status: DC
  Administered 2020-10-05 – 2020-10-13 (×21): 2.5 mg
  Filled 2020-10-05 (×22): qty 1

## 2020-10-05 MED ORDER — CHLORHEXIDINE GLUCONATE CLOTH 2 % EX PADS
6.0000 | MEDICATED_PAD | Freq: Every day | CUTANEOUS | Status: DC
  Administered 2020-10-06 – 2020-10-21 (×14): 6 via TOPICAL

## 2020-10-05 MED ORDER — MIDODRINE HCL 5 MG PO TABS: 5.0000 mg | ORAL_TABLET | Freq: Four times a day (QID) | ORAL | Status: DC

## 2020-10-05 NOTE — Progress Notes (Signed)
[facility] PROGRESS NOTE  Luis Orr HEN:[id number] DOB: 1950/05/26 DOA: 07/09/2020 PCP: Townsend Roger, MD  Status: Remains inpatient appropriate because:Hemodynamically unstable, Persistent severe electrolyte disturbances, Unsafe d/c plan, and Inpatient level of care appropriate due to severity of illness  Dispo: The patient is from: [facility] vent capable              Anticipated d/c is to: SNF Vent capable w/ access to in facility/stretcher based HD-a facility in [place] has been located in preauthorization in progress.              Patient currently is not medically stable to d/c.   Difficult to place patient Yes    Level of care: ICU  Code Status: Partial (no CPR and no defibrillation or cardioversion) Family Communication: Wife June at bedside on 9/21 DVT prophylaxis: IV Heparin COVID vaccination status: Unknown    HPI:  70 y.o. 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 Patient presented secondary to altered mental status and hypotension with recurrent pneumonia requiring vasopressor support and ICU admission.he has had multiple recent admissions for sepsis secondary to various infections including UTIs, pneumonias and bacteremia in setting of MDR Klebsiella, MRSA and pseudomonas. Most recent admission September 2021 in setting of sepsis due to MDR Klebsiella with right ureteral calculus s/p double J ureteral stent placement and treated with meropenem x 10days. He was discharged back to [facility].  ICU significant events: 6/29 admitted to ICU for septic shock on levo to maintain MAP>65, continued on vent support 6/29 Blood Cx>> staph species in 1 of 4 cultures drawn>> suspect contaminant 6/30 Off pressors 7/2 ID Consult for MDR acinetobacter in trach asp, Meropenem changed to unasyn 7/2 PRBC transfusion, iron replacement 7/5 Pressors added again. Central line placed. Drainage from gastrostomy  tube >> CT Abd without fluid collection around gastrostomy tube but with diffuse body wall edema. Echo EF 50-55%, mild LVH 7/6 Renal Replacement Therapy ; Code status changed to partial Code (DNR) 7/7 Transfuse 1U PRBC 7/9 add solu cortef 7/10 off pressors, weaning stress steroids 7/11 Pressors added back on again 7/12 ID consult due to rising WBC, hypothermia, hypotension concern for worsening infection. Vancomycin added. Repeat blood cx collected. 7/15 CRRT stopped 7/17 Restarted on low dose levo. No evidence of new infection. Held off on antibiotics. Nephrology contacted family and decision was made to continue iHD this week to allow additional time for renal recovery. Patient is not the best long term iHD candidate but family wants aggressive care 7/18 plans for iHD, tolerated with 2L removed 719 slight drop in hgb to 6.9 will transfuse 1 unit PRBC 7/22 > 7/26: Tolerating HD. TOC consulted for placement. Will likely need out of state placement. 8/15 WBCs greater than 28,000, hypoglycemia, procalcitonin >18, chest x-ray consistent with pneumonia, recent blood cultures consistent with contamination.  Unasyn IV resumed after sputum culture obtained 8/16 consultation placed with ethics committee regarding evaluation for futility of care 8/16 Sputum cx + for Acinetobacter with sensitivities pending 8/17 extensive conversation held with patient's wife regarding patient's poor prognosis and reemergence of Acinetobacter VAP.  Also discussed with her that patient has been able to convey to multiple staff that he does not want to continue on the ventilator or with dialysis.  Wife reports that the patient does not understand what he is saying, she is the POA and that she wants to continue aggressive care. 8/18 patient now telling nephrology, myself and the rest  of the staff he replies yes to wanting to remain on dialysis and on the ventilator and this direct question is asked. 8/18 Acinetobacter sensitive  to Unasyn only.  Discussed with ID/Dr. Juleen China.  Plan is to administer antibiotics for 7-day since patient has not had any increased O2 needs and remains afebrile.  If any concerns over worsening symptoms can give an additional 3 days. Developed moderate bleeding around tracheostomy site on 8/19.  Likely related to combination of Eliquis and heparin required for CRRT.  We will hold Eliquis for now and monitor degree of bleeding Eliquis resumed on 8/22.  Overnight into 8/23 patient bit his tongue and had bleeding.  Also had some bleeding around trach likely from oral bleeding.  Also though did have bleeding from old central line site. 8/23 had transient bleeding around trach and prior central line insertion site overnight.  Patient had bitten tongue and this likely explain the bleeding.  This morning patient was not having any bleeding.  On 8/25 had redeveloped slow ooze bleeding from mouth and trach.  Eliquis discontinued in favor of IV heparin especially given patient will undergo surgical procedure for vascular access on 8/29 8/30 Left FA AVG occluded 9/2 status post thrombectomy and revision of LUE AV graft 9/4 LUE edema with negative venous duplex. 9/5 heparin discontinued and Eliquis resumed    Subjective: Awake.  Looks at me blankly when spoken to but does not attempt to mouth words.  He tracks me around the room as I walked to the other side of the bed.  Continues to lack any type of response and affect remains bland.  Objective: Vitals:   10/05/20 0500 10/05/20 0600  BP: 137/67 139/71  Pulse: (!) 102 93  Resp: 20 20  Temp:    SpO2: 96% 98%    Intake/Output Summary (Last 24 hours) at 10/05/2020 0726 Last data filed at 10/05/2020 0600 Gross per 24 hour  Intake 1050 ml  Output 2445 ml  Net -1395 ml   Filed Weights   10/05/20 0024 10/05/20 0410 10/05/20 0457  Weight: 80.5 kg 78.3 kg 77.9 kg    Exam:  Constitutional: Awake, flat affect Respiratory: Trach: 6.0 XLT cuffed, Vent  settings: PRVC mode, Fio2 40%, rate 20, VT 510, PEEP 5; bilateral rhonchi diminished from yesterday, somewhat diminished at the bases.  Minimal foamy secretions around trach insertion site. Cardiovascular: Maintaining sinus rhythm and no apparent ectopy- pitting bilateral upper extremity edema that is stable AVG palpable thrill. Abdomen: LBM 9/23, PEG tube,  Left lower quadrant colostomy- abdomen nontender with normoactive bowel sounds.  Neurologic: Chronic quadriplegia. No sensation below shoulders. Psychiatric: Awake, and flat affect.  Does not interact.  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 -initial respiratory symptoms secondary to combination Acinetobacter pneumonia and volume overload-Redeveloped Acinetobacter pneumonia (8/14) and completed 7 days of Unasyn-Ventilator management per PCCM  Recurrent ventilator associated pneumonia 2/2 recurrent Acinetobacter/sepsis without shock WBCs stable with positive sputum culture sensitive only to Unasyn-ID rec 7 days of therapy -completed    AKI with oliguria/volume overload/ New CKD requiring HD Anuric-Nephrology following, initially required CRRT and has transitioned to HD (due to staffing issues continues on CRRT at bedside)-LUE AVG functional-revised 9/2 but needs to mature x 4 weeks before can dc   Septic shock 2/2 VAP POA Presented with sepsis physiology and  profound shock which has now resolved  Chronic thoracic back pain Continue IV morphine prn breakthrough pain; continue Skelaxin and lidocaine patch 9/23 change Oxy IR to 5 mg scheduled every 4 hours  History of paroxysmal atrial fibrillation Continue amiodarone and Eliquis maintaining SR QTc 420 ms on 1/75   Acute metabolic encephalopathy Resolved-Secondary to acute illness. CT head negative for  acute process.   Diabetes mellitus, type 2 Continue Semglee 30 units daily.  Continue SSI w/ CBG checks every 4 hours  Acute diverticulitis Has completed a course of Flagyl Continues to have residual pain WBC trended down currently 15.3 from a peak of 25.4 but has started climbing again with last 2 readings 25.6 and 21.2.  This is consistent with his chronic leukocytosis throughout the hospitalization.  Nodular irregular thickening of the ascending colon-?  Neoplasm [facility] consulted.  They discussed with his wife.  Given patient's debilitated state as well as his apparent acute diverticulitis they recommend holding off on colonoscopy for now.  They have signed off and recommend calling for any questions.   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  Plan is to discharge to facility in [place]   Microcytic anemia/Acute anemia superimposed on anemia of chronic kidney disease S/P 5 units of PRBC -Continue Aranesp and follow Hgb prn 9/23 hemoglobin slowly trending down and as of today is 7.1.  We will give 1 unit of PRBCs during dialysis and follow-up H/H after transfusion complete   Severe protein calorie malnutrition PEG has been replaced and is functional Discontinue Cortark  Pressure injury/stage IV sacral decubitus Stage IV medial/posterior sacrum present on admission.  Right/posterior/lateral ankle unknown if present on admission.  DTI left ear, not present on admission. (For additional documentation see wound care notes) Patient recently underwent bedside debridement of sacral and heel wound.  No evidence of infection found.  Wounds now  with red granular base.  Continue wound packing as prior.    Data Reviewed: Basic Metabolic Panel: Recent Labs  Lab 10/01/20 0325 10/02/20 1256 10/03/20 0341 10/04/20 0341 10/05/20 0006  NA 130* 129* 132* 130* 130*  K 5.1 5.5* 3.5 3.7 4.1  CL 94* 92* 94* 93* 93*  CO2 22 21* $Remo'25 24 23  'dBMCl$ GLUCOSE 146* 185* 200* 141* 121*  BUN 59* 93* 44* 74* 96*  CREATININE 1.00 1.39* 0.89 1.25* 1.48*  CALCIUM 8.7* 9.2 8.9 9.1 9.2  MG 2.1  --   --  2.2  --   PHOS  --  7.3*  --   --  6.3*   Liver Function Tests: Recent Labs  Lab 10/02/20 1256 10/04/20 0341 10/05/20 0006  AST  --  24  --   ALT  --  16  --   ALKPHOS  --  169*  --   BILITOT  --  0.8  --   PROT  --  7.0  --   ALBUMIN 1.7* 1.7* 1.6*    CBC: Recent Labs  Lab 10/01/20 1035 10/02/20 1256 10/03/20 0341 10/04/20 0341 10/05/20 0006  WBC 15.3* 21.9* 25.6* 21.2* 18.4*  NEUTROABS  --   --   --  16.7*  --   HGB 7.5* 8.3* 8.1* 7.7* 7.1*  HCT 24.1* 26.0*  25.9* 24.0* 22.4*  MCV 78.5* 78.3* 78.2* 76.9* 77.0*  PLT 431* 450* 442* 413* 434*   CBG: Recent Labs  Lab 10/04/20 1055 10/04/20 1508 10/04/20 1919 10/05/20 0014 10/05/20 0329  GLUCAP 137* 130* 127* 89 131*      Scheduled Meds:  sodium chloride   Intravenous Once   amiodarone  200 mg Per Tube Daily   apixaban  5 mg Per Tube BID   buPROPion  75 mg Per Tube BID   chlorhexidine gluconate (MEDLINE KIT)  15 mL Mouth Rinse BID   Chlorhexidine Gluconate Cloth  6 each Topical Q0600   Chlorhexidine Gluconate Cloth  6 each Topical Q0600   Chlorhexidine Gluconate Cloth  6 each Topical Q0600   darbepoetin (ARANESP) injection - DIALYSIS  100 mcg Intravenous Q Tue-HD   feeding supplement (PROSource TF)  45 mL Per Tube TID   guaiFENesin  5 mL Per Tube BID   insulin aspart  0-9 Units Subcutaneous Q4H   insulin glargine-yfgn  30 Units Subcutaneous Daily   lidocaine  1 patch Transdermal Q24H   mouth rinse  15 mL Mouth Rinse 10 times per day   metaxalone  800 mg Per Tube TID    midodrine  5 mg Per Tube TID   multivitamin  1 tablet Per Tube QHS   oxyCODONE  5 mg Per Tube Q6H   pantoprazole sodium  40 mg Per Tube Daily   sertraline  100 mg Per Tube Daily   simethicone  40 mg Per Tube QID   sodium chloride flush  10-40 mL Intracatheter Q12H   Continuous Infusions:  sodium chloride 10 mL/hr at 10/12/2020 1429   sodium chloride 500 mL (09/01/20 1408)   sodium chloride     sodium chloride     sodium chloride     sodium chloride     sodium chloride     sodium chloride     feeding supplement (NEPRO CARB STEADY) 1,000 mL (10/04/20 1157)    Principal Problem:   Acute respiratory failure with hypoxia (HCC) Active Problems:   Ventilator dependent (HCC)   Septic shock (HCC)   Pressure injury of skin   AKI (acute kidney injury) (HCC)   Hypotension   Goals of care, counseling/discussion   Quadriplegia (HCC)   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)   Hyperglycemia   Sepsis due to Acinetobacter baumannii ([place])   Pneumonia due to Acinetobacter species (HCC)   Dysautonomia (HCC)   Chronic midline thoracic back pain   Abdominal distention   Leukocytosis   Consultants: PCCM Nephrology Infectious disease Palliative care medicine  Procedures: Echocardiogram Insertion of double-lumen HD catheter  Antibiotics: Cefepime x1 dose Meropenem 6/29 through 7/2 Vancomycin 6/29 through 6/30 Unasyn 7/2 through 7/15 Unasyn 8/15 >8/22   Time spent: 25 minutes    Erin Hearing ANP  [facility] 7 am - 330 pm/M-F for direct patient care and secure chat Please refer to Amion for contact info 86  days

## 2020-10-05 NOTE — Progress Notes (Signed)
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  Presented from [facility] facility with altered mental status hypotension and pneumonia resultant septic shock is admitted to the intensive care unit.  Has had multiple admissions in the past for Klebsiella, MRSA and Pseudomonas urinary tract infections and pneumonia.  His most recent admission 10/03/2020 with multiple drug-resistant Klebsiella and right ureteral calculus requiring double-J stent.  Renal replacement therapy was initiated 07/18/2020.  Underwent successful dialysis 10/02/2020 with 2.5 L removed.  Underwent successful dialysis 10/04/2020.  With 2.2 liters removed  Blood pressure 160/70 pulse 96 temperature 97.5 O2 sats 97% FiO2 40%  Sodium 130 potassium 4.1 chloride 93 CO2 23 BUN 96 creatinine 1.48 glucose 121 calcium 9.2 phosphorus 6.3 hemoglobin 7.1  Minimal recorded urine output     Objective:  Vital signs in last 24 hours:  Temp:  [97.5 F (36.4 C)-100.1 F (37.8 C)] 97.5 F (36.4 C) (09/23 0410) Pulse Rate:  [91-103] 93 (09/23 0600) Resp:  [20] 20 (09/23 0600) BP: (90-155)/(63-88) 139/71 (09/23 0600) SpO2:  [95 %-100 %] 98 % (09/23 0600) FiO2 (%):  [40 %] 40 % (09/23 0343) Weight:  [77.9 kg-80.5 kg] 77.9 kg (09/23 0457)  Weight change: 1.3 kg Filed Weights   10/05/20 0024 10/05/20 0410 10/05/20 0457  Weight: 80.5 kg 78.3 kg 77.9 kg    Intake/Output: I/O last 3 completed shifts: In: 6962 [I.V.:20; Other:60; NG/GT:1530] Out: 2780 [Urine:130; Other:2200; Stool:450]   Intake/Output this shift:  No intake/output data recorded.  General:NAD, tracheostomy on vent. Heart:RRR, s1s2 nl Lungs: trach, no increased work of breathing. Abdomen:soft, Non-tender, PEG tube and ostomy bag Extremities:Trace lower extremities edema Neuro: awake Dialysis Access: LIJ TDC, LUE  AVG faint T/B+ (bruit w/ auscultation), swelling of the left upper extremity noted (stable)   Basic Metabolic Panel: Recent Labs  Lab 10/01/20 0325 10/02/20 1256 10/03/20 0341 10/04/20 0341 10/05/20 0006  NA 130* 129* 132* 130* 130*  K 5.1 5.5* 3.5 3.7 4.1  CL 94* 92* 94* 93* 93*  CO2 22 21* 25 24 23   GLUCOSE 146* 185* 200* 141* 121*  BUN 59* 93* 44* 74* 96*  CREATININE 1.00 1.39* 0.89 1.25* 1.48*  CALCIUM 8.7* 9.2 8.9 9.1 9.2  MG 2.1  --   --  2.2  --   PHOS  --  7.3*  --   --  6.3*     Liver Function Tests: Recent Labs  Lab 10/02/20 1256 10/04/20 0341 10/05/20 0006  AST  --  24  --   ALT  --  16  --   ALKPHOS  --  169*  --   BILITOT  --  0.8  --   PROT  --  7.0  --   ALBUMIN 1.7* 1.7* 1.6*    No results for input(s): LIPASE, AMYLASE in the last 168 hours. No results for input(s): AMMONIA in the last 168 hours.  CBC: Recent Labs  Lab 10/01/20 1035 10/02/20 1256 10/03/20 0341 10/04/20 0341 10/05/20 0006  WBC 15.3* 21.9* 25.6* 21.2* 18.4*  NEUTROABS  --   --   --  16.7*  --   HGB 7.5* 8.3* 8.1* 7.7* 7.1*  HCT 24.1* 26.0* 25.9* 24.0* 22.4*  MCV 78.5* 78.3* 78.2* 76.9* 77.0*  PLT 431* 450* 442* 413* 434*     Cardiac Enzymes: No results for input(s): CKTOTAL, CKMB, CKMBINDEX, TROPONINI in the last  168 hours.  BNP: Invalid input(s): POCBNP  CBG: Recent Labs  Lab 10/04/20 1055 10/04/20 1508 10/04/20 1919 10/05/20 0014 10/05/20 0329  GLUCAP 137* 130* 127* 73 131*     Microbiology: Results for orders placed or performed during the hospital encounter of 07/01/2020  Urine culture     Status: Abnormal   Collection Time: 07/10/2020  6:24 PM   Specimen: Urine, Random  Result Value Ref Range Status   Specimen Description URINE, RANDOM  Final   Special Requests   Final    NONE Performed at [facility], [place] [address], [place], [place] [address]    Culture MULTIPLE SPECIES PRESENT, SUGGEST RECOLLECTION (A)  Final   Report Status 07/13/2020  FINAL  Final  Culture, blood (routine x 2)     Status: Abnormal   Collection Time: 06/23/2020  6:42 PM   Specimen: BLOOD LEFT FOREARM  Result Value Ref Range Status   Specimen Description BLOOD LEFT FOREARM  Final   Special Requests   Final    BOTTLES DRAWN AEROBIC AND ANAEROBIC Blood Culture results may not be optimal due to an inadequate volume of blood received in culture bottles   Culture  Setup Time   Final    ANAEROBIC BOTTLE ONLY GRAM POSITIVE COCCI CRITICAL RESULT CALLED TO, READ BACK BY AND VERIFIED WITH: G ABBOTT PHARMD 07/13/20 0207 JDW    Culture (A)  Final    STAPHYLOCOCCUS AURICULARIS THE SIGNIFICANCE OF ISOLATING THIS ORGANISM FROM A SINGLE SET OF BLOOD CULTURES WHEN MULTIPLE SETS ARE DRAWN IS UNCERTAIN. PLEASE NOTIFY THE MICROBIOLOGY DEPARTMENT WITHIN ONE WEEK IF SPECIATION AND SENSITIVITIES ARE REQUIRED. Performed at [facility], [place] [address] [place], [place] [address]    Report Status 07/14/2020 FINAL  Final  Blood Culture ID Panel (Reflexed)     Status: Abnormal   Collection Time: 06/17/2020  6:42 PM  Result Value Ref Range Status   Enterococcus faecalis NOT DETECTED NOT DETECTED Final   Enterococcus Faecium NOT DETECTED NOT DETECTED Final   Listeria monocytogenes NOT DETECTED NOT DETECTED Final   Staphylococcus species DETECTED (A) NOT DETECTED Final    Comment: CRITICAL RESULT CALLED TO, READ BACK BY AND VERIFIED WITH: G ABBOTT PHARMD 07/13/20 0207 JDW    Staphylococcus aureus (BCID) NOT DETECTED NOT DETECTED Final   Staphylococcus epidermidis NOT DETECTED NOT DETECTED Final   Staphylococcus lugdunensis NOT DETECTED NOT DETECTED Final   Streptococcus species NOT DETECTED NOT DETECTED Final   Streptococcus agalactiae NOT DETECTED NOT DETECTED Final   Streptococcus pneumoniae NOT DETECTED NOT DETECTED Final   Streptococcus pyogenes NOT DETECTED NOT DETECTED Final   A.calcoaceticus-baumannii NOT DETECTED NOT DETECTED Final   Bacteroides fragilis NOT DETECTED  NOT DETECTED Final   Enterobacterales NOT DETECTED NOT DETECTED Final   Enterobacter cloacae complex NOT DETECTED NOT DETECTED Final   Escherichia coli NOT DETECTED NOT DETECTED Final   Klebsiella aerogenes NOT DETECTED NOT DETECTED Final   Klebsiella oxytoca NOT DETECTED NOT DETECTED Final   Klebsiella pneumoniae NOT DETECTED NOT DETECTED Final   Proteus species NOT DETECTED NOT DETECTED Final   Salmonella species NOT DETECTED NOT DETECTED Final   Serratia marcescens NOT DETECTED NOT DETECTED Final   Haemophilus influenzae NOT DETECTED NOT DETECTED Final   Neisseria meningitidis NOT DETECTED NOT DETECTED Final   Pseudomonas aeruginosa NOT DETECTED NOT DETECTED Final   Stenotrophomonas maltophilia NOT DETECTED NOT DETECTED Final   Candida albicans NOT DETECTED NOT DETECTED Final   Candida auris NOT  DETECTED NOT DETECTED Final   Candida glabrata NOT DETECTED NOT DETECTED Final   Candida krusei NOT DETECTED NOT DETECTED Final   Candida parapsilosis NOT DETECTED NOT DETECTED Final   Candida tropicalis NOT DETECTED NOT DETECTED Final   Cryptococcus neoformans/gattii NOT DETECTED NOT DETECTED Final    Comment: Performed at [facility], [place] [address] [place], [place] [address]  Resp Panel by RT-PCR (Flu A&B, Covid) Nasopharyngeal Swab     Status: None   Collection Time: 07/09/2020  7:22 PM   Specimen: Nasopharyngeal Swab; Nasopharyngeal(NP) swabs in vial transport medium  Result Value Ref Range Status   SARS Coronavirus 2 by RT PCR NEGATIVE NEGATIVE Final    Comment: (NOTE) SARS-CoV-2 target nucleic acids are NOT DETECTED.  The SARS-CoV-2 RNA is generally detectable in upper respiratory specimens during the acute phase of infection. The lowest concentration of SARS-CoV-2 viral copies this assay can detect is 138 copies/mL. A negative result does not preclude SARS-Cov-2 infection and should not be used as the sole basis for treatment or other patient management decisions. A  negative result may occur with  improper specimen collection/handling, submission of specimen other than nasopharyngeal swab, presence of viral mutation(s) within the areas targeted by this assay, and inadequate number of viral copies(<138 copies/mL). A negative result must be combined with clinical observations, patient history, and epidemiological information. The expected result is Negative.  Fact Sheet for Patients:  EntrepreneurPulse.com.au  Fact Sheet for Healthcare Providers:  IncredibleEmployment.be  This test is no t yet approved or cleared by the [place] FDA and  has been authorized for detection and/or diagnosis of SARS-CoV-2 by FDA under an Emergency Use Authorization (EUA). This EUA will remain  in effect (meaning this test can be used) for the duration of the COVID-19 declaration under Section 564(b)(1) of the Act, 21 U.S.C.section 360bbb-3(b)(1), unless the authorization is terminated  or revoked sooner.       Influenza A by PCR NEGATIVE NEGATIVE Final   Influenza B by PCR NEGATIVE NEGATIVE Final    Comment: (NOTE) The Xpert Xpress SARS-CoV-2/FLU/RSV plus assay is intended as an aid in the diagnosis of influenza from Nasopharyngeal swab specimens and should not be used as a sole basis for treatment. Nasal washings and aspirates are unacceptable for Xpert Xpress SARS-CoV-2/FLU/RSV testing.  Fact Sheet for Patients: EntrepreneurPulse.com.au  Fact Sheet for Healthcare Providers: IncredibleEmployment.be  This test is not yet approved or cleared by the [place] FDA and has been authorized for detection and/or diagnosis of SARS-CoV-2 by FDA under an Emergency Use Authorization (EUA). This EUA will remain in effect (meaning this test can be used) for the duration of the COVID-19 declaration under Section 564(b)(1) of the Act, 21 U.S.C. section 360bbb-3(b)(1), unless the authorization  is terminated or revoked.  Performed at [facility], [place] [address], [place], [place] [address]   Culture, blood (routine x 2)     Status: None   Collection Time: 07/02/2020  7:23 PM   Specimen: BLOOD  Result Value Ref Range Status   Specimen Description BLOOD RIGHT ANTECUBITAL  Final   Special Requests   Final    BOTTLES DRAWN AEROBIC AND ANAEROBIC Blood Culture adequate volume   Culture   Final    NO GROWTH 5 DAYS Performed at [facility], [place] [address], [place],  [address]    Report Status 07/16/2020 FINAL  Final  MRSA Next Gen by PCR, Nasal     Status: Abnormal  Collection Time: 07/02/2020 10:29 PM   Specimen: Nasal Mucosa; Nasal Swab  Result Value Ref Range Status   MRSA by PCR Next Gen DETECTED (A) NOT DETECTED Final    Comment: RESULT CALLED TO, READ BACK BY AND VERIFIED WITH: Ayesha Rumpf RN 07/12/20 0428 JDW (NOTE) The GeneXpert MRSA Assay (FDA approved for NASAL specimens only), is one component of a comprehensive MRSA colonization surveillance program. It is not intended to diagnose MRSA infection nor to guide or monitor treatment for MRSA infections. Test performance is not FDA approved in patients less than 62 years old. Performed at [facility], [place] [address], [place], [place] [address]   Culture, Respiratory w Gram Stain     Status: None   Collection Time: 07/12/20  3:48 PM   Specimen: Tracheal Aspirate; Respiratory  Result Value Ref Range Status   Specimen Description TRACHEAL ASPIRATE  Final   Special Requests NONE  Final   Gram Stain   Final    MODERATE WBC PRESENT, PREDOMINANTLY PMN FEW GRAM NEGATIVE RODS FEW GRAM POSITIVE COCCI IN PAIRS    Culture   Final    MODERATE ACINETOBACTER BAUMANNII MULTI-DRUG RESISTANT ORGANISM RESULT CALLED TO, READ BACK BY AND VERIFIED WITH: RN J.BEABRAUT ON [id number] AT 1410 BY E.PARRISH Performed at [facility], [place] [address] [place], [place] [address]    Report Status 07/14/2020 FINAL   Final   Organism ID, Bacteria ACINETOBACTER BAUMANNII  Final      Susceptibility   Acinetobacter baumannii - MIC*    CEFTAZIDIME 32 RESISTANT Resistant     CIPROFLOXACIN >=4 RESISTANT Resistant     GENTAMICIN >=16 RESISTANT Resistant     IMIPENEM >=16 RESISTANT Resistant     PIP/TAZO >=128 RESISTANT Resistant     TRIMETH/SULFA >=320 RESISTANT Resistant     AMPICILLIN/SULBACTAM 8 SENSITIVE Sensitive     * MODERATE ACINETOBACTER BAUMANNII  Culture, Urine     Status: Abnormal   Collection Time: 07/13/20 11:26 AM   Specimen: Urine, Random  Result Value Ref Range Status   Specimen Description URINE, RANDOM  Final   Special Requests   Final    NONE Performed at [facility], [place] [address], [place], [place] [address]    Culture MULTIPLE SPECIES PRESENT, SUGGEST RECOLLECTION (A)  Final   Report Status 07/14/2020 FINAL  Final  Culture, blood (routine x 2)     Status: None   Collection Time: 07/24/20  3:53 PM   Specimen: BLOOD RIGHT HAND  Result Value Ref Range Status   Specimen Description BLOOD RIGHT HAND  Final   Special Requests   Final    BOTTLES DRAWN AEROBIC AND ANAEROBIC Blood Culture results may not be optimal due to an inadequate volume of blood received in culture bottles   Culture   Final    NO GROWTH 5 DAYS Performed at [facility], [place] [address] [place], [place] [address]    Report Status 07/29/2020 FINAL  Final  Culture, blood (routine x 2)     Status: None   Collection Time: 07/24/20  4:10 PM   Specimen: BLOOD LEFT HAND  Result Value Ref Range Status   Specimen Description BLOOD LEFT HAND  Final   Special Requests   Final    BOTTLES DRAWN AEROBIC AND ANAEROBIC Blood Culture adequate volume   Culture   Final    NO GROWTH 5 DAYS Performed at [facility], [place] [address], [place], [place] [address]  Report Status 07/29/2020 FINAL  Final  MRSA Next Gen by PCR, Nasal     Status: None   Collection Time: 08/23/20  4:17 PM   Specimen: Nasal  Mucosa; Nasal Swab  Result Value Ref Range Status   MRSA by PCR Next Gen NOT DETECTED NOT DETECTED Final    Comment: (NOTE) The GeneXpert MRSA Assay (FDA approved for NASAL specimens only), is one component of a comprehensive MRSA colonization surveillance program. It is not intended to diagnose MRSA infection nor to guide or monitor treatment for MRSA infections. Test performance is not FDA approved in patients less than 43 years old. Performed at [facility], [place] [address] [place], [place] [address]   Culture, blood (routine x 2)     Status: None   Collection Time: 08/26/20  9:32 AM   Specimen: BLOOD RIGHT HAND  Result Value Ref Range Status   Specimen Description BLOOD RIGHT HAND  Final   Special Requests   Final    BOTTLES DRAWN AEROBIC ONLY Blood Culture results may not be optimal due to an inadequate volume of blood received in culture bottles   Culture   Final    NO GROWTH 5 DAYS Performed at [facility], [place] [address], [place], [place] [address]    Report Status 08/31/2020 FINAL  Final  Culture, blood (routine x 2)     Status: Abnormal   Collection Time: 08/26/20  9:38 AM   Specimen: BLOOD LEFT HAND  Result Value Ref Range Status   Specimen Description BLOOD LEFT HAND  Final   Special Requests   Final    BOTTLES DRAWN AEROBIC AND ANAEROBIC Blood Culture adequate volume   Culture  Setup Time   Final    GRAM POSITIVE COCCI IN CLUSTERS AEROBIC BOTTLE ONLY CRITICAL RESULT CALLED TO, READ BACK BY AND VERIFIED WITH: PHARM D A.LAWLESS ON [id number] AT 0945 BY E.PARRISH    Culture (A)  Final    STAPHYLOCOCCUS EPIDERMIDIS THE SIGNIFICANCE OF ISOLATING THIS ORGANISM FROM A SINGLE SET OF BLOOD CULTURES WHEN MULTIPLE SETS ARE DRAWN IS UNCERTAIN. PLEASE NOTIFY THE MICROBIOLOGY DEPARTMENT WITHIN ONE WEEK IF SPECIATION AND SENSITIVITIES ARE REQUIRED. Performed at [facility], [place] [address], [place], [place] [address]    Report Status 08/29/2020 FINAL  Final   Culture, Respiratory w Gram Stain     Status: None   Collection Time: 08/27/20  9:15 AM   Specimen: Tracheal Aspirate; Respiratory  Result Value Ref Range Status   Specimen Description TRACHEAL ASPIRATE  Final   Special Requests NONE  Final   Gram Stain   Final    ABUNDANT WBC PRESENT, PREDOMINANTLY PMN FEW GRAM POSITIVE COCCI FEW GRAM NEGATIVE RODS RARE GRAM POSITIVE RODS    Culture   Final    ABUNDANT ACINETOBACTER BAUMANNII MULTI-DRUG RESISTANT ORGANISM CRITICAL RESULT CALLED TO, READ BACK BY AND VERIFIED WITH: PHARMD K MCCLURE [id number] AT 1101 AM BY CM Performed at [facility], [place] [address] [place], [place] [address]    Report Status 08/30/2020 FINAL  Final   Organism ID, Bacteria ACINETOBACTER BAUMANNII  Final      Susceptibility   Acinetobacter baumannii - MIC*    CEFTAZIDIME 16 INTERMEDIATE Intermediate     CIPROFLOXACIN >=4 RESISTANT Resistant     GENTAMICIN >=16 RESISTANT Resistant     IMIPENEM >=16 RESISTANT Resistant     PIP/TAZO >=128 RESISTANT Resistant     TRIMETH/SULFA >=320 RESISTANT Resistant  AMPICILLIN/SULBACTAM 4 SENSITIVE Sensitive     * ABUNDANT ACINETOBACTER BAUMANNII  Surgical PCR screen     Status: None   Collection Time: 10/09/2020  9:25 AM   Specimen: Nasal Mucosa; Nasal Swab  Result Value Ref Range Status   MRSA, PCR NEGATIVE NEGATIVE Final   Staphylococcus aureus NEGATIVE NEGATIVE Final    Comment: (NOTE) The Xpert SA Assay (FDA approved for NASAL specimens in patients 43 years of age and older), is one component of a comprehensive surveillance program. It is not intended to diagnose infection nor to guide or monitor treatment. Performed at [facility], [place] [address], [place], [place] [address]   Gastrointestinal Panel by PCR , Stool     Status: None   Collection Time: 09/28/20  1:16 PM   Specimen: Stool  Result Value Ref Range Status   Campylobacter species NOT DETECTED NOT DETECTED Final   Plesimonas shigelloides NOT  DETECTED NOT DETECTED Final   Salmonella species NOT DETECTED NOT DETECTED Final   Yersinia enterocolitica NOT DETECTED NOT DETECTED Final   Vibrio species NOT DETECTED NOT DETECTED Final   Vibrio cholerae NOT DETECTED NOT DETECTED Final   Enteroaggregative E coli (EAEC) NOT DETECTED NOT DETECTED Final   Enteropathogenic E coli (EPEC) NOT DETECTED NOT DETECTED Final   Enterotoxigenic E coli (ETEC) NOT DETECTED NOT DETECTED Final   Shiga like toxin producing E coli (STEC) NOT DETECTED NOT DETECTED Final   Shigella/Enteroinvasive E coli (EIEC) NOT DETECTED NOT DETECTED Final   Cryptosporidium NOT DETECTED NOT DETECTED Final   Cyclospora cayetanensis NOT DETECTED NOT DETECTED Final   Entamoeba histolytica NOT DETECTED NOT DETECTED Final   Giardia lamblia NOT DETECTED NOT DETECTED Final   Adenovirus F40/41 NOT DETECTED NOT DETECTED Final   Astrovirus NOT DETECTED NOT DETECTED Final   Norovirus GI/GII NOT DETECTED NOT DETECTED Final   Rotavirus A NOT DETECTED NOT DETECTED Final   Sapovirus (I, II, IV, and V) NOT DETECTED NOT DETECTED Final    Comment: Performed at [facility], [place] [place], [place] [address]    Coagulation Studies: No results for input(s): LABPROT, INR in the last 72 hours.  Urinalysis: No results for input(s): COLORURINE, LABSPEC, PHURINE, GLUCOSEU, HGBUR, BILIRUBINUR, KETONESUR, PROTEINUR, UROBILINOGEN, NITRITE, LEUKOCYTESUR in the last 72 hours.  Invalid input(s): APPERANCEUR    Imaging: No results found.   Medications:    sodium chloride 10 mL/hr at 09/21/2020 1429   sodium chloride 500 mL (09/01/20 1408)   sodium chloride     sodium chloride     sodium chloride     sodium chloride     sodium chloride     sodium chloride     feeding supplement (NEPRO CARB STEADY) 1,000 mL (10/04/20 1157)    amiodarone  200 mg Per Tube Daily   apixaban  5 mg Per Tube BID   buPROPion  75 mg Per Tube BID   chlorhexidine gluconate (MEDLINE KIT)  15 mL  Mouth Rinse BID   Chlorhexidine Gluconate Cloth  6 each Topical Q0600   Chlorhexidine Gluconate Cloth  6 each Topical Q0600   darbepoetin (ARANESP) injection - DIALYSIS  100 mcg Intravenous Q Tue-HD   feeding supplement (PROSource TF)  45 mL Per Tube TID   guaiFENesin  5 mL Per Tube BID   insulin aspart  0-9 Units Subcutaneous Q4H   insulin glargine-yfgn  30 Units Subcutaneous Daily   lidocaine  1 patch Transdermal Q24H   mouth rinse  15 mL Mouth Rinse 10 times per day   metaxalone  800 mg Per Tube TID   midodrine  5 mg Per Tube TID   multivitamin  1 tablet Per Tube QHS   oxyCODONE  5 mg Per Tube Q6H   pantoprazole sodium  40 mg Per Tube Daily   sertraline  100 mg Per Tube Daily   simethicone  40 mg Per Tube QID   sodium chloride flush  10-40 mL Intracatheter Q12H   sodium chloride, sodium chloride, sodium chloride, sodium chloride, sodium chloride, sodium chloride, sodium chloride, alteplase, glycopyrrolate, heparin, heparin, heparin sodium (porcine), ipratropium-albuterol, lidocaine (PF), lidocaine-prilocaine, morphine injection, pentafluoroprop-tetrafluoroeth, phenol, polyethylene glycol, sodium chloride flush  Assessment/ Plan:  1.Acute kidney Injury-prolonged dialysis dependency, now progressed to ESRD:  ATN from septic shock-without any evidence of renal recovery to date . initiated on CRRT 07/18/20 and transitioned to IHD.  Has [facility] & AVG, continue with HD on TTS schedule for now.  Next dialysis treatment will be 10/06/2020    2 Vascular access - had left forearm AVG placed 09/09/2020 but unfortunately had clotted.  s/p thrombectomy and revision 9/2, graft can be used 4 weeks thereafter.  Swelling of left upper extremity noted continue arm elevation and supportive care.  Continue to follow  3 Septic shock/ventilator associated pneumonia due to recurrent Acinetobacter infection: Off antibiotics and stable hemodynamics.   4 Leukocytosis -persistent, will defer to primary service for  further mgmt   5 Chronic respiratory failure: Status post tracheostomy with ventilator management per CCM.   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   7 Chronic kidney disease/metabolic bone disease: Calcium and phosphorus level under control with hemodialysis, off binders   8 Anemia: contiue ESA, monitor Hb.  Transfuse as needed.  9 Acute metabolic encephalopathy: Continue management as above.,  Supportive care.   10 Disposition - pt cannot return to [facility] now that he is ESRD. He is going to [facility] in [place], [place]. He needs to have [place] Medicaid & his AVG needs to be mature prior to being accepted. Other possible barrier is pt is a permanent trach & vent pt.    LOS: [place] @TODAY @7 :10 AM

## 2020-10-06 LAB — CBC WITH DIFFERENTIAL/PLATELET
Abs Immature Granulocytes: 0.47 10*3/uL — ABNORMAL HIGH (ref 0.00–0.07)
Basophils Absolute: 0.1 10*3/uL (ref 0.0–0.1)
Basophils Relative: 1 %
Eosinophils Absolute: 0.1 10*3/uL (ref 0.0–0.5)
Eosinophils Relative: 0 %
HCT: 27.8 % — ABNORMAL LOW (ref 39.0–52.0)
Hemoglobin: 9.1 g/dL — ABNORMAL LOW (ref 13.0–17.0)
Immature Granulocytes: 2 %
Lymphocytes Relative: 13 %
Lymphs Abs: 2.8 10*3/uL (ref 0.7–4.0)
MCH: 25.4 pg — ABNORMAL LOW (ref 26.0–34.0)
MCHC: 32.7 g/dL (ref 30.0–36.0)
MCV: 77.7 fL — ABNORMAL LOW (ref 80.0–100.0)
Monocytes Absolute: 2 10*3/uL — ABNORMAL HIGH (ref 0.1–1.0)
Monocytes Relative: 9 %
Neutro Abs: 15.6 10*3/uL — ABNORMAL HIGH (ref 1.7–7.7)
Neutrophils Relative %: 75 %
Platelets: 449 10*3/uL — ABNORMAL HIGH (ref 150–400)
RBC: 3.58 MIL/uL — ABNORMAL LOW (ref 4.22–5.81)
RDW: 19.1 % — ABNORMAL HIGH (ref 11.5–15.5)
WBC: 20.9 10*3/uL — ABNORMAL HIGH (ref 4.0–10.5)
nRBC: 1.3 % — ABNORMAL HIGH (ref 0.0–0.2)

## 2020-10-06 LAB — GLUCOSE, CAPILLARY
Glucose-Capillary: 111 mg/dL — ABNORMAL HIGH (ref 70–99)
Glucose-Capillary: 115 mg/dL — ABNORMAL HIGH (ref 70–99)
Glucose-Capillary: 120 mg/dL — ABNORMAL HIGH (ref 70–99)
Glucose-Capillary: 130 mg/dL — ABNORMAL HIGH (ref 70–99)
Glucose-Capillary: 134 mg/dL — ABNORMAL HIGH (ref 70–99)
Glucose-Capillary: 96 mg/dL (ref 70–99)

## 2020-10-06 LAB — BPAM RBC
Blood Product Expiration Date: 202210192359
ISSUE DATE / TIME: 202209231538
Unit Type and Rh: 5100

## 2020-10-06 LAB — COMPREHENSIVE METABOLIC PANEL
ALT: 14 U/L (ref 0–44)
AST: 27 U/L (ref 15–41)
Albumin: 1.8 g/dL — ABNORMAL LOW (ref 3.5–5.0)
Alkaline Phosphatase: 170 U/L — ABNORMAL HIGH (ref 38–126)
Anion gap: 13 (ref 5–15)
BUN: 64 mg/dL — ABNORMAL HIGH (ref 8–23)
CO2: 25 mmol/L (ref 22–32)
Calcium: 9.2 mg/dL (ref 8.9–10.3)
Chloride: 93 mmol/L — ABNORMAL LOW (ref 98–111)
Creatinine, Ser: 1.17 mg/dL (ref 0.61–1.24)
GFR, Estimated: >60 mL/min (ref >60)
Glucose, Bld: 136 mg/dL — ABNORMAL HIGH (ref 70–99)
Potassium: 3.1 mmol/L — ABNORMAL LOW (ref 3.5–5.1)
Sodium: 131 mmol/L — ABNORMAL LOW (ref 135–145)
Total Bilirubin: 1 mg/dL (ref 0.3–1.2)
Total Protein: 7.5 g/dL (ref 6.5–8.1)

## 2020-10-06 LAB — TYPE AND SCREEN
ABO/RH(D): O POS
Antibody Screen: NEGATIVE
Unit division: 0

## 2020-10-06 LAB — HEPATITIS B SURFACE ANTIBODY, QUANTITATIVE: Hep B S AB Quant (Post): <3.1 m[IU]/mL — ABNORMAL LOW (ref >9.9)

## 2020-10-06 MED ORDER — POTASSIUM CHLORIDE 20 MEQ PO PACK
20.0000 meq | PACK | Freq: Once | ORAL | Status: AC
  Administered 2020-10-06: 20 meq via ORAL
  Filled 2020-10-06: qty 1

## 2020-10-06 NOTE — Progress Notes (Signed)
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  Presented from [facility] facility with altered mental status hypotension and pneumonia resultant septic shock is admitted to the intensive care unit.  Has had multiple admissions in the past for Klebsiella, MRSA and Pseudomonas urinary tract infections and pneumonia.  His most recent admission 10/03/2020 with multiple drug-resistant Klebsiella and right ureteral calculus requiring double-J stent.  Renal replacement therapy was initiated 07/18/2020.     Underwent successful dialysis 10/05/2020 with 2 L removed.  Blood pressure 143/79 pulse 83 temperature 98.3 O2 sats 90% FiO2 40%  Sodium 131 potassium 3.1 chloride 93 CO2 25 BUN 64 creatinine 1.17 glucose 136 calcium  9.2 hemoglobin 9.1  Minimal recorded urine output     Objective:  Vital signs in last 24 hours:  Temp:  [98.3 F (36.8 C)-99.7 F (37.6 C)] 98.3 F (36.8 C) (09/24 0808) Pulse Rate:  [83-103] 86 (09/24 0804) Resp:  [18-21] 20 (09/24 0804) BP: (82-153)/(50-79) 143/79 (09/24 0804) SpO2:  [94 %-100 %] 100 % (09/24 0804) FiO2 (%):  [40 %] 40 % (09/24 0804)  Weight change:  Filed Weights   10/05/20 0024 10/05/20 0410 10/05/20 0457  Weight: 80.5 kg 78.3 kg 77.9 kg    Intake/Output: I/O last 3 completed shifts: In: 1370 [I.V.:10; Other:100; NG/GT:1260] Out: 2355 [Urine:45; Other:2200; Stool:110]   Intake/Output this shift:  No intake/output data recorded.  General:NAD, tracheostomy on vent. Heart:RRR, s1s2 nl Lungs: trach, no increased work of breathing. Abdomen:soft, Non-tender, PEG tube and ostomy bag Extremities:Trace lower extremities edema Neuro: awake Dialysis Access: LIJ TDC, LUE AVG faint T/B+ (bruit w/ auscultation), swelling of the left upper extremity noted (stable)   Basic Metabolic Panel: Recent Labs   Lab 10/01/20 0325 10/02/20 1256 10/03/20 0341 10/04/20 0341 10/05/20 0006 10/06/20 0429  NA 130* 129* 132* 130* 130* 131*  K 5.1 5.5* 3.5 3.7 4.1 3.1*  CL 94* 92* 94* 93* 93* 93*  CO2 22 21* 25 24 23 25   GLUCOSE 146* 185* 200* 141* 121* 136*  BUN 59* 93* 44* 74* 96* 64*  CREATININE 1.00 1.39* 0.89 1.25* 1.48* 1.17  CALCIUM 8.7* 9.2 8.9 9.1 9.2 9.2  MG 2.1  --   --  2.2  --   --   PHOS  --  7.3*  --   --  6.3*  --      Liver Function Tests: Recent Labs  Lab 10/02/20 1256 10/04/20 0341 10/05/20 0006 10/06/20 0429  AST  --  24  --  27  ALT  --  16  --  14  ALKPHOS  --  169*  --  170*  BILITOT  --  0.8  --  1.0  PROT  --  7.0  --  7.5  ALBUMIN 1.7* 1.7* 1.6* 1.8*    No results for input(s): LIPASE, AMYLASE in the last 168 hours. No results for input(s): AMMONIA in the last 168 hours.  CBC: Recent Labs  Lab 10/02/20 1256 10/03/20 0341 10/04/20 0341 10/05/20 0006 10/05/20 2000 10/06/20 0429  WBC 21.9* 25.6* 21.2* 18.4*  --  20.9*  NEUTROABS  --   --  16.7*  --   --  15.6*  HGB 8.3* 8.1* 7.7* 7.1* 8.6* 9.1*  HCT 26.0* 25.9* 24.0* 22.4* 26.4* 27.8*  MCV 78.3* 78.2* 76.9* 77.0*  --  77.7*  PLT 450* 442* 413* 434*  --  449*     Cardiac Enzymes: No results for input(s): CKTOTAL, CKMB, CKMBINDEX, TROPONINI in the last 168 hours.  BNP: Invalid input(s): POCBNP  CBG: Recent Labs  Lab 10/05/20 1534 10/05/20 1959 10/05/20 2310 10/06/20 0309 10/06/20 0744  GLUCAP 153* 103* 117* 111* 115*     Microbiology: Results for orders placed or performed during the hospital encounter of 07/09/2020  Urine culture     Status: Abnormal   Collection Time: 07/06/2020  6:24 PM   Specimen: Urine, Random  Result Value Ref Range Status   Specimen Description URINE, RANDOM  Final   Special Requests   Final    NONE Performed at [facility], [place] [address], [place], [place] [address]    Culture MULTIPLE SPECIES PRESENT, SUGGEST RECOLLECTION (A)  Final   Report  Status 07/13/2020 FINAL  Final  Culture, blood (routine x 2)     Status: Abnormal   Collection Time: 07/07/2020  6:42 PM   Specimen: BLOOD LEFT FOREARM  Result Value Ref Range Status   Specimen Description BLOOD LEFT FOREARM  Final   Special Requests   Final    BOTTLES DRAWN AEROBIC AND ANAEROBIC Blood Culture results may not be optimal due to an inadequate volume of blood received in culture bottles   Culture  Setup Time   Final    ANAEROBIC BOTTLE ONLY GRAM POSITIVE COCCI CRITICAL RESULT CALLED TO, READ BACK BY AND VERIFIED WITH: G ABBOTT PHARMD 07/13/20 0207 JDW    Culture (A)  Final    STAPHYLOCOCCUS AURICULARIS THE SIGNIFICANCE OF ISOLATING THIS ORGANISM FROM A SINGLE SET OF BLOOD CULTURES WHEN MULTIPLE SETS ARE DRAWN IS UNCERTAIN. PLEASE NOTIFY THE MICROBIOLOGY DEPARTMENT WITHIN ONE WEEK IF SPECIATION AND SENSITIVITIES ARE REQUIRED. Performed at [facility], [place] [address] [place],  [address]    Report Status 07/14/2020 FINAL  Final  Blood Culture ID Panel (Reflexed)     Status: Abnormal   Collection Time: 06/20/2020  6:42 PM  Result Value Ref Range Status   Enterococcus faecalis NOT DETECTED NOT DETECTED Final   Enterococcus Faecium NOT DETECTED NOT DETECTED Final   Listeria monocytogenes NOT DETECTED NOT DETECTED Final   Staphylococcus species DETECTED (A) NOT DETECTED Final    Comment: CRITICAL RESULT CALLED TO, READ BACK BY AND VERIFIED WITH: G ABBOTT PHARMD 07/13/20 0207 JDW    Staphylococcus aureus (BCID) NOT DETECTED NOT DETECTED Final   Staphylococcus epidermidis NOT DETECTED NOT DETECTED Final   Staphylococcus lugdunensis NOT DETECTED NOT DETECTED Final   Streptococcus species NOT DETECTED NOT DETECTED Final   Streptococcus agalactiae NOT DETECTED NOT DETECTED Final   Streptococcus pneumoniae NOT DETECTED NOT DETECTED Final   Streptococcus pyogenes NOT DETECTED NOT DETECTED Final   A.calcoaceticus-baumannii NOT DETECTED NOT DETECTED Final   Bacteroides  fragilis NOT DETECTED NOT DETECTED Final   Enterobacterales NOT DETECTED NOT DETECTED Final   Enterobacter cloacae complex NOT DETECTED NOT DETECTED Final   Escherichia coli NOT DETECTED NOT DETECTED Final   Klebsiella aerogenes NOT DETECTED NOT DETECTED Final   Klebsiella oxytoca NOT DETECTED NOT DETECTED Final   Klebsiella pneumoniae NOT DETECTED NOT DETECTED Final   Proteus species NOT DETECTED NOT DETECTED Final   Salmonella species NOT DETECTED NOT DETECTED Final   Serratia marcescens NOT DETECTED NOT DETECTED Final   Haemophilus influenzae NOT DETECTED NOT DETECTED Final   Neisseria meningitidis NOT DETECTED NOT DETECTED Final   Pseudomonas aeruginosa NOT DETECTED NOT DETECTED Final   Stenotrophomonas maltophilia NOT  DETECTED NOT DETECTED Final   Candida albicans NOT DETECTED NOT DETECTED Final   Candida auris NOT DETECTED NOT DETECTED Final   Candida glabrata NOT DETECTED NOT DETECTED Final   Candida krusei NOT DETECTED NOT DETECTED Final   Candida parapsilosis NOT DETECTED NOT DETECTED Final   Candida tropicalis NOT DETECTED NOT DETECTED Final   Cryptococcus neoformans/gattii NOT DETECTED NOT DETECTED Final    Comment: Performed at [facility], [place] [address], [place], [place] [address]  Resp Panel by RT-PCR (Flu A&B, Covid) Nasopharyngeal Swab     Status: None   Collection Time: 07/05/2020  7:22 PM   Specimen: Nasopharyngeal Swab; Nasopharyngeal(NP) swabs in vial transport medium  Result Value Ref Range Status   SARS Coronavirus 2 by RT PCR NEGATIVE NEGATIVE Final    Comment: (NOTE) SARS-CoV-2 target nucleic acids are NOT DETECTED.  The SARS-CoV-2 RNA is generally detectable in upper respiratory specimens during the acute phase of infection. The lowest concentration of SARS-CoV-2 viral copies this assay can detect is 138 copies/mL. A negative result does not preclude SARS-Cov-2 infection and should not be used as the sole basis for treatment or other patient  management decisions. A negative result may occur with  improper specimen collection/handling, submission of specimen other than nasopharyngeal swab, presence of viral mutation(s) within the areas targeted by this assay, and inadequate number of viral copies(<138 copies/mL). A negative result must be combined with clinical observations, patient history, and epidemiological information. The expected result is Negative.  Fact Sheet for Patients:  EntrepreneurPulse.com.au  Fact Sheet for Healthcare Providers:  IncredibleEmployment.be  This test is no t yet approved or cleared by the [place] FDA and  has been authorized for detection and/or diagnosis of SARS-CoV-2 by FDA under an Emergency Use Authorization (EUA). This EUA will remain  in effect (meaning this test can be used) for the duration of the COVID-19 declaration under Section 564(b)(1) of the Act, 21 U.S.C.section 360bbb-3(b)(1), unless the authorization is terminated  or revoked sooner.       Influenza A by PCR NEGATIVE NEGATIVE Final   Influenza B by PCR NEGATIVE NEGATIVE Final    Comment: (NOTE) The Xpert Xpress SARS-CoV-2/FLU/RSV plus assay is intended as an aid in the diagnosis of influenza from Nasopharyngeal swab specimens and should not be used as a sole basis for treatment. Nasal washings and aspirates are unacceptable for Xpert Xpress SARS-CoV-2/FLU/RSV testing.  Fact Sheet for Patients: EntrepreneurPulse.com.au  Fact Sheet for Healthcare Providers: IncredibleEmployment.be  This test is not yet approved or cleared by the [place] FDA and has been authorized for detection and/or diagnosis of SARS-CoV-2 by FDA under an Emergency Use Authorization (EUA). This EUA will remain in effect (meaning this test can be used) for the duration of the COVID-19 declaration under Section 564(b)(1) of the Act, 21 U.S.C. section 360bbb-3(b)(1),  unless the authorization is terminated or revoked.  Performed at [facility], [place] [address] [place], [place] [address]   Culture, blood (routine x 2)     Status: None   Collection Time: 06/22/2020  7:23 PM   Specimen: BLOOD  Result Value Ref Range Status   Specimen Description BLOOD RIGHT ANTECUBITAL  Final   Special Requests   Final    BOTTLES DRAWN AEROBIC AND ANAEROBIC Blood Culture adequate volume   Culture   Final    NO GROWTH 5 DAYS Performed at [facility], [place] [address] [place], [place] [address]    Report Status  07/16/2020 FINAL  Final  MRSA Next Gen by PCR, Nasal     Status: Abnormal   Collection Time: 07/12/2020 10:29 PM   Specimen: Nasal Mucosa; Nasal Swab  Result Value Ref Range Status   MRSA by PCR Next Gen DETECTED (A) NOT DETECTED Final    Comment: RESULT CALLED TO, READ BACK BY AND VERIFIED WITH: Ayesha Rumpf RN 07/12/20 0428 JDW (NOTE) The GeneXpert MRSA Assay (FDA approved for NASAL specimens only), is one component of a comprehensive MRSA colonization surveillance program. It is not intended to diagnose MRSA infection nor to guide or monitor treatment for MRSA infections. Test performance is not FDA approved in patients less than 50 years old. Performed at [facility], [place] [address], [place], [place] [address]   Culture, Respiratory w Gram Stain     Status: None   Collection Time: 07/12/20  3:48 PM   Specimen: Tracheal Aspirate; Respiratory  Result Value Ref Range Status   Specimen Description TRACHEAL ASPIRATE  Final   Special Requests NONE  Final   Gram Stain   Final    MODERATE WBC PRESENT, PREDOMINANTLY PMN FEW GRAM NEGATIVE RODS FEW GRAM POSITIVE COCCI IN PAIRS    Culture   Final    MODERATE ACINETOBACTER BAUMANNII MULTI-DRUG RESISTANT ORGANISM RESULT CALLED TO, READ BACK BY AND VERIFIED WITH: RN J.BEABRAUT ON [id number] AT 1410 BY E.PARRISH Performed at [facility], [place] [address] [place], [place] [address]    Report  Status 07/14/2020 FINAL  Final   Organism ID, Bacteria ACINETOBACTER BAUMANNII  Final      Susceptibility   Acinetobacter baumannii - MIC*    CEFTAZIDIME 32 RESISTANT Resistant     CIPROFLOXACIN >=4 RESISTANT Resistant     GENTAMICIN >=16 RESISTANT Resistant     IMIPENEM >=16 RESISTANT Resistant     PIP/TAZO >=128 RESISTANT Resistant     TRIMETH/SULFA >=320 RESISTANT Resistant     AMPICILLIN/SULBACTAM 8 SENSITIVE Sensitive     * MODERATE ACINETOBACTER BAUMANNII  Culture, Urine     Status: Abnormal   Collection Time: 07/13/20 11:26 AM   Specimen: Urine, Random  Result Value Ref Range Status   Specimen Description URINE, RANDOM  Final   Special Requests   Final    NONE Performed at [facility], [place] [address] [place], [place] [address]    Culture MULTIPLE SPECIES PRESENT, SUGGEST RECOLLECTION (A)  Final   Report Status 07/14/2020 FINAL  Final  Culture, blood (routine x 2)     Status: None   Collection Time: 07/24/20  3:53 PM   Specimen: BLOOD RIGHT HAND  Result Value Ref Range Status   Specimen Description BLOOD RIGHT HAND  Final   Special Requests   Final    BOTTLES DRAWN AEROBIC AND ANAEROBIC Blood Culture results may not be optimal due to an inadequate volume of blood received in culture bottles   Culture   Final    NO GROWTH 5 DAYS Performed at [facility], [place] [address] [place], [place] [address]    Report Status 07/29/2020 FINAL  Final  Culture, blood (routine x 2)     Status: None   Collection Time: 07/24/20  4:10 PM   Specimen: BLOOD LEFT HAND  Result Value Ref Range Status   Specimen Description BLOOD LEFT HAND  Final   Special Requests   Final    BOTTLES DRAWN AEROBIC AND ANAEROBIC Blood Culture adequate volume   Culture   Final  NO GROWTH 5 DAYS Performed at [facility], [place] [address], [place], [place] [address]    Report Status 07/29/2020 FINAL  Final  MRSA Next Gen by PCR, Nasal     Status: None   Collection Time: 08/23/20  4:17 PM    Specimen: Nasal Mucosa; Nasal Swab  Result Value Ref Range Status   MRSA by PCR Next Gen NOT DETECTED NOT DETECTED Final    Comment: (NOTE) The GeneXpert MRSA Assay (FDA approved for NASAL specimens only), is one component of a comprehensive MRSA colonization surveillance program. It is not intended to diagnose MRSA infection nor to guide or monitor treatment for MRSA infections. Test performance is not FDA approved in patients less than 2 years old. Performed at [facility], [place] [address] [place], [place] [address]   Culture, blood (routine x 2)     Status: None   Collection Time: 08/26/20  9:32 AM   Specimen: BLOOD RIGHT HAND  Result Value Ref Range Status   Specimen Description BLOOD RIGHT HAND  Final   Special Requests   Final    BOTTLES DRAWN AEROBIC ONLY Blood Culture results may not be optimal due to an inadequate volume of blood received in culture bottles   Culture   Final    NO GROWTH 5 DAYS Performed at [facility], [place] [address] [place], [place] [address]    Report Status 08/31/2020 FINAL  Final  Culture, blood (routine x 2)     Status: Abnormal   Collection Time: 08/26/20  9:38 AM   Specimen: BLOOD LEFT HAND  Result Value Ref Range Status   Specimen Description BLOOD LEFT HAND  Final   Special Requests   Final    BOTTLES DRAWN AEROBIC AND ANAEROBIC Blood Culture adequate volume   Culture  Setup Time   Final    GRAM POSITIVE COCCI IN CLUSTERS AEROBIC BOTTLE ONLY CRITICAL RESULT CALLED TO, READ BACK BY AND VERIFIED WITH: PHARM D A.LAWLESS ON [id number] AT 0945 BY E.PARRISH    Culture (A)  Final    STAPHYLOCOCCUS EPIDERMIDIS THE SIGNIFICANCE OF ISOLATING THIS ORGANISM FROM A SINGLE SET OF BLOOD CULTURES WHEN MULTIPLE SETS ARE DRAWN IS UNCERTAIN. PLEASE NOTIFY THE MICROBIOLOGY DEPARTMENT WITHIN ONE WEEK IF SPECIATION AND SENSITIVITIES ARE REQUIRED. Performed at [facility], [place] [address] [place], [place] [address]    Report Status  08/29/2020 FINAL  Final  Culture, Respiratory w Gram Stain     Status: None   Collection Time: 08/27/20  9:15 AM   Specimen: Tracheal Aspirate; Respiratory  Result Value Ref Range Status   Specimen Description TRACHEAL ASPIRATE  Final   Special Requests NONE  Final   Gram Stain   Final    ABUNDANT WBC PRESENT, PREDOMINANTLY PMN FEW GRAM POSITIVE COCCI FEW GRAM NEGATIVE RODS RARE GRAM POSITIVE RODS    Culture   Final    ABUNDANT ACINETOBACTER BAUMANNII MULTI-DRUG RESISTANT ORGANISM CRITICAL RESULT CALLED TO, READ BACK BY AND VERIFIED WITH: PHARMD K MCCLURE [id number] AT 1101 AM BY CM Performed at [facility], [place] [address] [place], [place] [address]    Report Status 08/30/2020 FINAL  Final   Organism ID, Bacteria ACINETOBACTER BAUMANNII  Final      Susceptibility   Acinetobacter baumannii - MIC*    CEFTAZIDIME 16 INTERMEDIATE Intermediate     CIPROFLOXACIN >=4 RESISTANT Resistant     GENTAMICIN >=16 RESISTANT Resistant     IMIPENEM >=16 RESISTANT  Resistant     PIP/TAZO >=128 RESISTANT Resistant     TRIMETH/SULFA >=320 RESISTANT Resistant     AMPICILLIN/SULBACTAM 4 SENSITIVE Sensitive     * ABUNDANT ACINETOBACTER BAUMANNII  Surgical PCR screen     Status: None   Collection Time: 09/20/2020  9:25 AM   Specimen: Nasal Mucosa; Nasal Swab  Result Value Ref Range Status   MRSA, PCR NEGATIVE NEGATIVE Final   Staphylococcus aureus NEGATIVE NEGATIVE Final    Comment: (NOTE) The Xpert SA Assay (FDA approved for NASAL specimens in patients 36 years of age and older), is one component of a comprehensive surveillance program. It is not intended to diagnose infection nor to guide or monitor treatment. Performed at [facility], [place] [address], [place], [place] [address]   Gastrointestinal Panel by PCR , Stool     Status: None   Collection Time: 09/28/20  1:16 PM   Specimen: Stool  Result Value Ref Range Status   Campylobacter species NOT DETECTED NOT DETECTED Final    Plesimonas shigelloides NOT DETECTED NOT DETECTED Final   Salmonella species NOT DETECTED NOT DETECTED Final   Yersinia enterocolitica NOT DETECTED NOT DETECTED Final   Vibrio species NOT DETECTED NOT DETECTED Final   Vibrio cholerae NOT DETECTED NOT DETECTED Final   Enteroaggregative E coli (EAEC) NOT DETECTED NOT DETECTED Final   Enteropathogenic E coli (EPEC) NOT DETECTED NOT DETECTED Final   Enterotoxigenic E coli (ETEC) NOT DETECTED NOT DETECTED Final   Shiga like toxin producing E coli (STEC) NOT DETECTED NOT DETECTED Final   Shigella/Enteroinvasive E coli (EIEC) NOT DETECTED NOT DETECTED Final   Cryptosporidium NOT DETECTED NOT DETECTED Final   Cyclospora cayetanensis NOT DETECTED NOT DETECTED Final   Entamoeba histolytica NOT DETECTED NOT DETECTED Final   Giardia lamblia NOT DETECTED NOT DETECTED Final   Adenovirus F40/41 NOT DETECTED NOT DETECTED Final   Astrovirus NOT DETECTED NOT DETECTED Final   Norovirus GI/GII NOT DETECTED NOT DETECTED Final   Rotavirus A NOT DETECTED NOT DETECTED Final   Sapovirus (I, II, IV, and V) NOT DETECTED NOT DETECTED Final    Comment: Performed at [facility], [place]., [place], [place] [address]    Coagulation Studies: No results for input(s): LABPROT, INR in the last 72 hours.  Urinalysis: No results for input(s): COLORURINE, LABSPEC, PHURINE, GLUCOSEU, HGBUR, BILIRUBINUR, KETONESUR, PROTEINUR, UROBILINOGEN, NITRITE, LEUKOCYTESUR in the last 72 hours.  Invalid input(s): APPERANCEUR    Imaging: No results found.   Medications:    sodium chloride 10 mL/hr at 09/26/2020 1429   sodium chloride 500 mL (09/01/20 1408)   sodium chloride     sodium chloride     sodium chloride     sodium chloride     sodium chloride     sodium chloride     feeding supplement (NEPRO CARB STEADY) 1,000 mL (10/05/20 1043)    amiodarone  200 mg Per Tube Daily   apixaban  5 mg Per Tube BID   buPROPion  75 mg Per Tube BID   chlorhexidine  gluconate (MEDLINE KIT)  15 mL Mouth Rinse BID   Chlorhexidine Gluconate Cloth  6 each Topical Q0600   Chlorhexidine Gluconate Cloth  6 each Topical Q0600   Chlorhexidine Gluconate Cloth  6 each Topical Q0600   darbepoetin (ARANESP) injection - DIALYSIS  100 mcg Intravenous Q Tue-HD   feeding supplement (PROSource TF)  45 mL Per Tube TID   guaiFENesin  5 mL Per Tube BID  insulin aspart  0-9 Units Subcutaneous Q4H   insulin glargine-yfgn  30 Units Subcutaneous Daily   lidocaine  1 patch Transdermal Q24H   mouth rinse  15 mL Mouth Rinse 10 times per day   metaxalone  800 mg Per Tube TID   midodrine  2.5 mg Per Tube TID WC   multivitamin  1 tablet Per Tube QHS   oxyCODONE  5 mg Per Tube Q4H   pantoprazole sodium  40 mg Per Tube Daily   sertraline  100 mg Per Tube Daily   simethicone  40 mg Per Tube QID   sodium chloride flush  10-40 mL Intracatheter Q12H   sodium chloride, sodium chloride, sodium chloride, sodium chloride, sodium chloride, sodium chloride, sodium chloride, alteplase, glycopyrrolate, heparin, heparin, heparin sodium (porcine), ipratropium-albuterol, lidocaine (PF), lidocaine-prilocaine, midodrine, morphine injection, pentafluoroprop-tetrafluoroeth, phenol, polyethylene glycol, sodium chloride flush  Assessment/ Plan:  1.Acute kidney Injury-prolonged dialysis dependency, now progressed to ESRD:  ATN from septic shock-without any evidence of renal recovery to date . initiated on CRRT 07/18/20 and transitioned to IHD.  Has [facility] & AVG, he received dialysis after midnight 10/05/2020 although was scheduled for 10/04/2020.  We will be able to run him 10/07/2020 due to high dialysis census   2 Vascular access - had left forearm AVG placed 08/17/2020 but unfortunately had clotted.  s/p thrombectomy and revision 9/2, graft can be used 4 weeks thereafter.  Swelling of left upper extremity noted continue arm elevation and supportive care.  Continue to follow  3 Septic shock/ventilator associated  pneumonia due to recurrent Acinetobacter infection: Off antibiotics and stable hemodynamics.   4 Leukocytosis -persistent, will defer to primary service for further mgmt   5 Chronic respiratory failure: Status post tracheostomy with ventilator management per CCM.   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   7 Chronic kidney disease/metabolic bone disease: Calcium and phosphorus level under control with hemodialysis, off binders   8 Anemia: contiue ESA, monitor Hb.  Transfuse as needed.  9 Acute metabolic encephalopathy: Continue management as above.,  Supportive care.   10 Disposition - pt cannot return to [facility] now that he is ESRD. He is going to [facility] in [place], [place]. He needs to have [place] Medicaid & his AVG needs to be mature prior to being accepted. Other possible barrier is pt is a permanent trach & vent pt.    LOS: [place] @TODAY @8 :33 AM

## 2020-10-06 NOTE — Progress Notes (Signed)
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  Patient presented secondary to altered mental status and hypotension with recurrent pneumonia requiring vasopressor support and ICU admission.  He has had multiple recent admission for sepsis secondary to various infectious including UTI, pneumonia and bacteremia in the setting of MDR Klebsiella, MRSA and Pseudomonas.  Most recent admission September 2021 in setting of sepsis due to MDR Klebsiella with right ureteral calculus status post double JJ ureteral stent placement and treated with meropenem for 10 days.  He was discharged back to [facility].  He was admitted 6/29 to the ICU with septic shock on Levophed.  Trach asp gross MDR Acinetobacter treated with meropenem.  Patient progress  to renal failure requiring renal replacement therapy.  Patient with prolonged hospital course.  See below.    ICU significant events: 6/29 admitted to ICU for septic shock on levo to maintain MAP>65, continued on vent support 6/29 Blood Cx>> staph species in 1 of 4 cultures drawn>> suspect contaminant 6/30 Off pressors 7/2 ID Consult for MDR acinetobacter in trach asp, Meropenem changed to unasyn 7/2 PRBC transfusion, iron replacement 7/5 Pressors added again. Central line placed. Drainage from gastrostomy tube >> CT Abd without fluid collection around gastrostomy tube but with diffuse body wall edema. Echo EF 50-55%, mild LVH 7/6 Renal Replacement Therapy ; Code status changed to partial Code (DNR) 7/7 Transfuse 1U PRBC 7/9 add solu cortef 7/10 off pressors, weaning stress steroids 7/11 Pressors added back on again 7/12 ID consult due to rising WBC, hypothermia, hypotension concern for worsening infection. Vancomycin added. Repeat blood cx  collected. 7/15 CRRT stopped 7/17 Restarted on low dose levo. No evidence of new infection. Held off on antibiotics. Nephrology contacted family and decision was made to continue iHD this week to allow additional time for renal recovery. Patient is not the best long term iHD candidate but family wants aggressive care 7/18 plans for iHD, tolerated with 2L removed 719 slight drop in hgb to 6.9 will transfuse 1 unit PRBC 7/22 > 7/26: Tolerating HD. TOC consulted for placement. Will likely need out of state placement. 8/15 WBCs greater than 28,000, hypoglycemia, procalcitonin >18, chest x-ray consistent with pneumonia, recent blood cultures consistent with contamination.  Unasyn IV resumed after sputum culture obtained 8/16 consultation placed with ethics committee regarding evaluation for futility of care 8/16 Sputum cx + for Acinetobacter with sensitivities pending 8/17 extensive conversation held with patient's wife regarding patient's poor prognosis and reemergence of Acinetobacter VAP.  Also discussed with her that patient has been able to convey to multiple staff that he does not want to continue on the ventilator or with dialysis.  Wife reports that the patient does not understand what he is saying, she is the POA and that she wants to continue aggressive care. 8/18 patient now telling nephrology, myself and the rest of the staff he replies yes to wanting to remain on dialysis and on the ventilator and this direct question is asked. 8/18 Acinetobacter sensitive to Unasyn only.  Discussed with ID/Dr. Juleen China.  Plan is to administer antibiotics for 7-day since patient has not had any increased O2 needs and remains afebrile.  If any concerns over worsening symptoms can give an additional 3 days. Developed moderate bleeding around tracheostomy site on 8/19.  Likely related to combination of Eliquis  and heparin required for CRRT.  We will hold Eliquis for now and monitor degree of bleeding Eliquis resumed  on 8/22.  Overnight into 8/23 patient bit his tongue and had bleeding.  Also had some bleeding around trach likely from oral bleeding.  Also though did have bleeding from old central line site. 8/23 had transient bleeding around trach and prior central line insertion site overnight.  Patient had bitten tongue and this likely explain the bleeding.  This morning patient was not having any bleeding.  On 8/25 had redeveloped slow ooze bleeding from mouth and trach.  Eliquis discontinued in favor of IV heparin especially given patient will undergo surgical procedure for vascular access on 8/29 8/30 Left FA AVG occluded 9/2 status post thrombectomy and revision of LUE AV graft 9/4 LUE edema with negative venous duplex. 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  Treated for PNA.Marland Kitchen  Continue with suctioning due to secretions.   Recurrent ventilator associated pneumonia secondary to recurrent see no bacteremia/sepsis without shock -Completed antibiotics treatment.   AKI with oliguria/volume overload/new CKD requiring hemodialysis Continue with HD.  -Needs AVG mature to be able to transfer to [place].  -Will give 20 meq of potassium.  -Nephrology following.   Septic shock secondary to VAP POA -Resolved.   Chronic thoracic back  pain: -Continue with pain management.   History of paroxysmal A. fib: On amiodarone and eliquis.   Acute metabolic encephalopathy Secondary to acute illness.  Alert, non verbal.   Leukocytosis; Afebrile. Follow trend.  WBC fluctuates.  His wound decubitus was debrided He received a course of antibiotics  for diverticulitis.   Diabetes type 2: Continue with Semglee and SSI.   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   Shock  liver: Resolved.   Quadriplegia  secondary to compressive myelopathy -support care.  -poor prognosis.   Macrocytic anemia: -S/p 5 units of packed red blood cell.  Severe protein caloric malnutrition: Continue with tube feeding PEG tube was replaced   Nodular irregularity ascending Colon.  GI was consulted. Patient is to high risk for colonoscopy.   Pressure injury/stage IV sacral decubitus: Will ask Wound care to follow up on patient.   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  Right/posterior/lateral ankle unknown if present on admission.  DTI left ear, not present on admission. (For additional documentation see wound care notes)      Pressure Injury 07/02/2020 Sacrum Posterior;Medial Stage 4 - Full thickness tissue loss with exposed bone, tendon or muscle. (Active)  06/27/2020 2240  Location: Sacrum  Location Orientation: Posterior;Medial  Staging: Stage 4 - Full thickness tissue loss with exposed bone, tendon or muscle.  Wound Description (Comments):   Present on Admission: Yes     Pressure Injury 07/23/20 Ear Left Deep Tissue Pressure Injury - Purple  or maroon localized area of discolored intact skin or blood-filled blister due to damage of underlying soft tissue from pressure and/or shear. DTI left ear (Active)  07/23/20 2000  Location: Ear  Location Orientation: Left  Staging: Deep Tissue Pressure Injury - Purple or maroon localized area of discolored  intact skin or blood-filled blister due to damage of underlying soft tissue from pressure and/or shear.  Wound Description (Comments): DTI left ear  Present on Admission: No     Pressure Injury 09/03/20 Heel Right Stage 2 -  Partial thickness loss of dermis presenting as a shallow open injury with a red, pink wound bed without slough. (Active)  09/03/20 0800  Location: Heel  Location Orientation: Right  Staging: Stage 2 -  Partial thickness loss of dermis presenting as a shallow open injury with a red, pink wound bed without slough.  Wound Description (Comments):   Present on Admission:      Nutrition Problem: Increased nutrient needs Etiology: wound healing    Signs/Symptoms: estimated needs    Interventions: Tube feeding, Prostat, MVI  Estimated body mass index is 27.72 kg/m as calculated from the following:   Height as of this encounter: 5' 6"  (1.676 m).   Weight as of this encounter: 77.9 kg.   DVT prophylaxis: eliquis Code Status: partial   Remains inpatient appropriate because:Ongoing active pain requiring inpatient pain management  Dispo: The patient is from: SNF              Anticipated d/c is to: SNF              Patient currently is medically stable to d/c.   Difficult to place patient No        Consultants:  CCM   Subjective: Alert, eyes open. Non verbal  Per nurse , patient has been more comfortable since pain medications was change to Q 4 hours. He continue to have a lot of secretion from trach. Respiratory therapist continue to follow, CCM aware,.  Decubitus draining some. Will get Wound care to follow on patient.   Objective: Vitals:   10/06/20 0400 10/06/20 0414 10/06/20 0500 10/06/20 0600  BP: (!) 146/71  130/70 136/76  Pulse: 98  84 83  Resp: 20  20 20   Temp:      TempSrc:      SpO2: 100% 100% 99% 100%  Weight:      Height:        Intake/Output Summary (Last 24 hours) at 10/06/2020 0708 Last data filed at 10/06/2020 0600 Gross per  24 hour  Intake 875 ml  Output 85 ml  Net 790 ml    Filed Weights   10/05/20 0024 10/05/20 0410 10/05/20 0457  Weight: 80.5 kg 78.3 kg 77.9 kg    Examination:  General exam: Chronic ill appearing, NAD, trach Respiratory system: BL air movement.  Cardiovascular system: S 1, S 2 RRR Gastrointestinal system: BS present, soft, nt Central nervous system: Alert, Quadriplegic Extremities: no edema    Data Reviewed: I have personally reviewed following labs and imaging studies  CBC: Recent Labs  Lab 10/02/20 1256 10/03/20 0341 10/04/20 0341 10/05/20 0006 10/05/20 2000 10/06/20 0429  WBC 21.9* 25.6* 21.2* 18.4*  --  20.9*  NEUTROABS  --   --  16.7*  --   --  15.6*  HGB 8.3* 8.1* 7.7* 7.1* 8.6* 9.1*  HCT 26.0* 25.9* 24.0* 22.4* 26.4* 27.8*  MCV 78.3* 78.2* 76.9* 77.0*  --  77.7*  PLT 450* 442* 413* 434*  --  449*    Basic Metabolic Panel: Recent Labs  Lab 10/01/20 0325 10/02/20 1256 10/03/20 0341 10/04/20 0341 10/05/20 0006 10/06/20 0429  NA 130* 129* 132* 130* 130* 131*  K 5.1 5.5* 3.5 3.7 4.1 3.1*  CL 94* 92* 94* 93* 93* 93*  CO2 22 21* 25 24 23 25   GLUCOSE 146* 185* 200* 141* 121* 136*  BUN 59* 93* 44* 74* 96* 64*  CREATININE 1.00 1.39* 0.89 1.25* 1.48* 1.17  CALCIUM 8.7* 9.2 8.9 9.1 9.2 9.2  MG 2.1  --   --  2.2  --   --   PHOS  --  7.3*  --   --  6.3*  --     GFR: Estimated Creatinine Clearance: 57.7 mL/min (by C-G formula based on SCr of 1.17 mg/dL). Liver Function Tests: Recent Labs  Lab 10/02/20 1256 10/04/20 0341 10/05/20 0006 10/06/20 0429  AST  --  24  --  27  ALT  --  16  --  14  ALKPHOS  --  169*  --  170*  BILITOT  --  0.8  --  1.0  PROT  --  7.0  --  7.5  ALBUMIN 1.7* 1.7* 1.6* 1.8*    No results for input(s): LIPASE, AMYLASE in the last 168 hours. No results for input(s): AMMONIA in the last 168 hours. Coagulation Profile: No results for input(s): INR, PROTIME in the last 168 hours. Cardiac Enzymes: No results for input(s):  CKTOTAL, CKMB, CKMBINDEX, TROPONINI in the last 168 hours. BNP (last 3 results) No results for input(s): PROBNP in the last 8760 hours. HbA1C: No results for input(s): HGBA1C in the last 72 hours. CBG: Recent Labs  Lab 10/05/20 1104 10/05/20 1534 10/05/20 1959 10/05/20 2310 10/06/20 0309  GLUCAP 205* 153* 103* 117* 111*    Lipid Profile: No results for input(s): CHOL, HDL, LDLCALC, TRIG, CHOLHDL, LDLDIRECT in the last 72 hours. Thyroid Function Tests: No results for input(s): TSH, T4TOTAL, FREET4, T3FREE, THYROIDAB in the last 72 hours. Anemia Panel: No results for input(s): VITAMINB12, FOLATE, FERRITIN, TIBC, IRON, RETICCTPCT in the last 72 hours. Sepsis Labs: No results for input(s): PROCALCITON, LATICACIDVEN in the last 168 hours.   Recent Results (from the past 240 hour(s))  Gastrointestinal Panel by PCR , Stool     Status: None   Collection Time: 09/28/20  1:16 PM   Specimen: Stool  Result Value Ref Range Status   Campylobacter species NOT DETECTED NOT DETECTED Final   Plesimonas shigelloides NOT DETECTED NOT DETECTED Final   Salmonella species NOT DETECTED NOT DETECTED Final   Yersinia enterocolitica NOT DETECTED NOT DETECTED Final   Vibrio species NOT DETECTED NOT DETECTED Final   Vibrio cholerae NOT DETECTED NOT DETECTED Final   Enteroaggregative E coli (EAEC) NOT DETECTED NOT DETECTED Final   Enteropathogenic E coli (EPEC) NOT DETECTED NOT DETECTED Final   Enterotoxigenic E coli (ETEC) NOT DETECTED NOT DETECTED Final   Shiga like toxin producing E coli (STEC) NOT DETECTED NOT DETECTED Final   Shigella/Enteroinvasive E coli (EIEC) NOT DETECTED NOT DETECTED Final   Cryptosporidium NOT DETECTED NOT DETECTED Final   Cyclospora cayetanensis NOT DETECTED NOT DETECTED Final   Entamoeba histolytica NOT DETECTED NOT DETECTED Final   Giardia lamblia NOT DETECTED NOT DETECTED Final   Adenovirus F40/41 NOT DETECTED NOT DETECTED Final   Astrovirus NOT DETECTED NOT DETECTED  Final   Norovirus GI/GII NOT DETECTED NOT DETECTED Final   Rotavirus A NOT DETECTED NOT DETECTED Final  Sapovirus (I, II, IV, and V) NOT DETECTED NOT DETECTED Final    Comment: Performed at [facility], [address], [place], [place] [address]          Radiology Studies: No results found.      Scheduled Meds:  amiodarone  200 mg Per Tube Daily   apixaban  5 mg Per Tube BID   buPROPion  75 mg Per Tube BID   chlorhexidine gluconate (MEDLINE KIT)  15 mL Mouth Rinse BID   Chlorhexidine Gluconate Cloth  6 each Topical Q0600   Chlorhexidine Gluconate Cloth  6 each Topical Q0600   Chlorhexidine Gluconate Cloth  6 each Topical Q0600   darbepoetin (ARANESP) injection - DIALYSIS  100 mcg Intravenous Q Tue-HD   feeding supplement (PROSource TF)  45 mL Per Tube TID   guaiFENesin  5 mL Per Tube BID   insulin aspart  0-9 Units Subcutaneous Q4H   insulin glargine-yfgn  30 Units Subcutaneous Daily   lidocaine  1 patch Transdermal Q24H   mouth rinse  15 mL Mouth Rinse 10 times per day   metaxalone  800 mg Per Tube TID   midodrine  2.5 mg Per Tube TID WC   multivitamin  1 tablet Per Tube QHS   oxyCODONE  5 mg Per Tube Q4H   pantoprazole sodium  40 mg Per Tube Daily   potassium chloride  20 mEq Oral Once   sertraline  100 mg Per Tube Daily   simethicone  40 mg Per Tube QID   sodium chloride flush  10-40 mL Intracatheter Q12H   Continuous Infusions:  sodium chloride 10 mL/hr at 09/13/2020 1429   sodium chloride 500 mL (09/01/20 1408)   sodium chloride     sodium chloride     sodium chloride     sodium chloride     sodium chloride     sodium chloride     feeding supplement (NEPRO CARB STEADY) 1,000 mL (10/05/20 1043)     LOS: 87 days    Time spent: 35 minutes.     Elmarie Shiley, MD [facility]   If 7PM-7AM, please contact night-coverage [email or web address]  10/06/2020, 7:08 AM

## 2020-10-06 NOTE — Progress Notes (Signed)
Assisted wife with tele-visit via elink

## 2020-10-07 LAB — BASIC METABOLIC PANEL
Anion gap: 16 — ABNORMAL HIGH (ref 5–15)
BUN: 97 mg/dL — ABNORMAL HIGH (ref 8–23)
CO2: 23 mmol/L (ref 22–32)
Calcium: 9.3 mg/dL (ref 8.9–10.3)
Chloride: 92 mmol/L — ABNORMAL LOW (ref 98–111)
Creatinine, Ser: 1.64 mg/dL — ABNORMAL HIGH (ref 0.61–1.24)
GFR, Estimated: 45 mL/min — ABNORMAL LOW (ref >60)
Glucose, Bld: 187 mg/dL — ABNORMAL HIGH (ref 70–99)
Potassium: 3.9 mmol/L (ref 3.5–5.1)
Sodium: 131 mmol/L — ABNORMAL LOW (ref 135–145)

## 2020-10-07 LAB — CBC
HCT: 25.1 % — ABNORMAL LOW (ref 39.0–52.0)
Hemoglobin: 8 g/dL — ABNORMAL LOW (ref 13.0–17.0)
MCH: 24.7 pg — ABNORMAL LOW (ref 26.0–34.0)
MCHC: 31.9 g/dL (ref 30.0–36.0)
MCV: 77.5 fL — ABNORMAL LOW (ref 80.0–100.0)
Platelets: 462 10*3/uL — ABNORMAL HIGH (ref 150–400)
RBC: 3.24 MIL/uL — ABNORMAL LOW (ref 4.22–5.81)
RDW: 19.7 % — ABNORMAL HIGH (ref 11.5–15.5)
WBC: 17.3 10*3/uL — ABNORMAL HIGH (ref 4.0–10.5)
nRBC: 0.9 % — ABNORMAL HIGH (ref 0.0–0.2)

## 2020-10-07 LAB — GLUCOSE, CAPILLARY
Glucose-Capillary: 120 mg/dL — ABNORMAL HIGH (ref 70–99)
Glucose-Capillary: 134 mg/dL — ABNORMAL HIGH (ref 70–99)
Glucose-Capillary: 135 mg/dL — ABNORMAL HIGH (ref 70–99)
Glucose-Capillary: 144 mg/dL — ABNORMAL HIGH (ref 70–99)
Glucose-Capillary: 156 mg/dL — ABNORMAL HIGH (ref 70–99)
Glucose-Capillary: 157 mg/dL — ABNORMAL HIGH (ref 70–99)

## 2020-10-07 MED ORDER — CHLORHEXIDINE GLUCONATE CLOTH 2 % EX PADS: 6.0000 | MEDICATED_PAD | Freq: Every day | CUTANEOUS | Status: DC

## 2020-10-07 NOTE — Progress Notes (Signed)
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  Patient presented secondary to altered mental status and hypotension with recurrent pneumonia requiring vasopressor support and ICU admission.  He has had multiple recent admission for sepsis secondary to various infectious including UTI, pneumonia and bacteremia in the setting of MDR Klebsiella, MRSA and Pseudomonas.  Most recent admission September 2021 in setting of sepsis due to MDR Klebsiella with right ureteral calculus status post double JJ ureteral stent placement and treated with meropenem for 10 days.  He was discharged back to [facility].  He was admitted 6/29 to the ICU with septic shock on Levophed.  Trach asp gross MDR Acinetobacter treated with meropenem.  Patient progress  to renal failure requiring renal replacement therapy.  Patient with prolonged hospital course.  See below.    ICU significant events: 6/29 admitted to ICU for septic shock on levo to maintain MAP>65, continued on vent support 6/29 Blood Cx>> staph species in 1 of 4 cultures drawn>> suspect contaminant 6/30 Off pressors 7/2 ID Consult for MDR acinetobacter in trach asp, Meropenem changed to unasyn 7/2 PRBC transfusion, iron replacement 7/5 Pressors added again. Central line placed. Drainage from gastrostomy tube >> CT Abd without fluid collection around gastrostomy tube but with diffuse body wall edema. Echo EF 50-55%, mild LVH 7/6 Renal Replacement Therapy ; Code status changed to partial Code (DNR) 7/7 Transfuse 1U PRBC 7/9 add solu cortef 7/10 off pressors, weaning stress steroids 7/11 Pressors added back on again 7/12 ID consult due to rising WBC, hypothermia, hypotension concern for worsening infection. Vancomycin added. Repeat blood cx  collected. 7/15 CRRT stopped 7/17 Restarted on low dose levo. No evidence of new infection. Held off on antibiotics. Nephrology contacted family and decision was made to continue iHD this week to allow additional time for renal recovery. Patient is not the best long term iHD candidate but family wants aggressive care 7/18 plans for iHD, tolerated with 2L removed 719 slight drop in hgb to 6.9 will transfuse 1 unit PRBC 7/22 > 7/26: Tolerating HD. TOC consulted for placement. Will likely need out of state placement. 8/15 WBCs greater than 28,000, hypoglycemia, procalcitonin >18, chest x-ray consistent with pneumonia, recent blood cultures consistent with contamination.  Unasyn IV resumed after sputum culture obtained 8/16 consultation placed with ethics committee regarding evaluation for futility of care 8/16 Sputum cx + for Acinetobacter with sensitivities pending 8/17 extensive conversation held with patient's wife regarding patient's poor prognosis and reemergence of Acinetobacter VAP.  Also discussed with her that patient has been able to convey to multiple staff that he does not want to continue on the ventilator or with dialysis.  Wife reports that the patient does not understand what he is saying, she is the POA and that she wants to continue aggressive care. 8/18 patient now telling nephrology, myself and the rest of the staff he replies yes to wanting to remain on dialysis and on the ventilator and this direct question is asked. 8/18 Acinetobacter sensitive to Unasyn only.  Discussed with ID/Dr. Juleen China.  Plan is to administer antibiotics for 7-day since patient has not had any increased O2 needs and remains afebrile.  If any concerns over worsening symptoms can give an additional 3 days. Developed moderate bleeding around tracheostomy site on 8/19.  Likely related to combination of Eliquis  and heparin required for CRRT.  We will hold Eliquis for now and monitor degree of bleeding Eliquis resumed  on 8/22.  Overnight into 8/23 patient bit his tongue and had bleeding.  Also had some bleeding around trach likely from oral bleeding.  Also though did have bleeding from old central line site. 8/23 had transient bleeding around trach and prior central line insertion site overnight.  Patient had bitten tongue and this likely explain the bleeding.  This morning patient was not having any bleeding.  On 8/25 had redeveloped slow ooze bleeding from mouth and trach.  Eliquis discontinued in favor of IV heparin especially given patient will undergo surgical procedure for vascular access on 8/29 8/30 Left FA AVG occluded 9/2 status post thrombectomy and revision of LUE AV graft 9/4 LUE edema with negative venous duplex. 9/5 heparin discontinued and Eliquis resumed    Assessment & Plan:   Principal Problem:   Acute respiratory failure with hypoxia (HCC) Active Problems:   Ventilator dependent (HCC)   Septic shock (HCC)   Pressure injury of skin   AKI (acute kidney injury) ([place])   Hypotension   Goals of care, counseling/discussion   Quadriplegia ([place])   Chronic kidney disease requiring chronic dialysis ([place])   Hyperglycemia   Sepsis due to Acinetobacter baumannii ([place])   Pneumonia due to Acinetobacter species ([place])   Dysautonomia (HCC)   Chronic midline thoracic back pain   Abdominal distention   Leukocytosis   1-Acute on chronic respiratory failure with hypoxia and hypercapnia: Chronic Vent Dependent.  Treated for PNA.Mar[place] Kitchen    Recurrent ventilator associated pneumonia secondary to recurrent see no bacteremia/sepsis without shock -Completed antibiotics treatment.   AKI with oliguria/volume overload/new CKD requiring hemodialysis Continue with HD.  -Needs AVG mature to be able to transfer to [place].  -Nephrology following. Plan for HD 9/25  Septic shock secondary to VAP POA -Resolved.   Chronic thoracic back pain: -Continue with pain management.   History of paroxysmal A. fib: On  amiodarone and eliquis.   Acute metabolic encephalopathy Secondary to acute illness.  Alert, Non verbal.   Leukocytosis; Afebrile. Follow trend.  WBC fluctuates.  His wound decubitus was debrided He received a course of antibiotics  for diverticulitis.  Repeat labs tomorrow.   Diabetes type 2: Continue with Semglee and SSI.   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   Shock  liver: Resolved.   Quadriplegia  secondary to compressive myelopathy -support care.  -poor prognosis.   Macrocytic anemia: -S/p total 6 units of packed red blood cell during this hospitalization.  - Severe protein caloric malnutrition: Continue with tube feeding PEG tube was replaced   Nodular irregularity ascending Colon.  GI was consulted. Patient is to high risk for colonoscopy.  Wound care consulted.   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  Right/posterior/lateral ankle unknown if present on admission.  DTI left ear, not present on admission. (For additional documentation see wound care notes)      Pressure Injury 07/08/2020 Sacrum Posterior;Medial Stage 4 - Full thickness tissue loss with exposed bone, tendon or muscle. (Active)  07/04/2020 2240  Location: Sacrum  Location Orientation: Posterior;Medial  Staging: Stage 4 - Full thickness tissue loss with exposed bone, tendon or muscle.  Wound Description (Comments):   Present on Admission: Yes     Pressure Injury 07/23/20 Ear Left Deep Tissue Pressure Injury - Purple or maroon localized area of discolored intact skin or blood-filled blister due to  damage of underlying soft tissue from pressure and/or shear. DTI left ear (Active)  07/23/20 2000  Location: Ear  Location Orientation: Left  Staging: Deep Tissue Pressure Injury - Purple or maroon localized area of discolored intact skin or blood-filled blister due to damage of underlying soft tissue from  pressure and/or shear.  Wound Description (Comments): DTI left ear  Present on Admission: No     Pressure Injury 09/03/20 Heel Right Stage 2 -  Partial thickness loss of dermis presenting as a shallow open injury with a red, pink wound bed without slough. (Active)  09/03/20 0800  Location: Heel  Location Orientation: Right  Staging: Stage 2 -  Partial thickness loss of dermis presenting as a shallow open injury with a red, pink wound bed without slough.  Wound Description (Comments):   Present on Admission:      Nutrition Problem: Increased nutrient needs Etiology: wound healing    Signs/Symptoms: estimated needs    Interventions: Tube feeding, Prostat, MVI  Estimated body mass index is 27.72 kg/m as calculated from the following:   Height as of this encounter: 5' 6"  (1.676 m).   Weight as of this encounter: 77.9 kg.   DVT prophylaxis: eliquis Code Status: partial   Remains inpatient appropriate because:Ongoing active pain requiring inpatient pain management  Dispo: The patient is from: SNF              Anticipated d/c is to: SNF              Patient currently is medically stable to d/c.   Difficult to place patient No        Consultants:  CCM   Subjective: Alert, non verbal , he has been stable per nurse staff   Objective: Vitals:   10/07/20 0930 10/07/20 0945 10/07/20 1000 10/07/20 1015  BP:   118/70   Pulse: 79 82 81 81  Resp: 20 20 20 20   Temp:      TempSrc:      SpO2: 100% 100% 100% 100%  Weight:      Height:        Intake/Output Summary (Last 24 hours) at 10/07/2020 1020 Last data filed at 10/07/2020 0741 Gross per 24 hour  Intake 955 ml  Output 210 ml  Net 745 ml    Filed Weights   10/05/20 0410 10/05/20 0457 10/07/20 0130  Weight: 78.3 kg 77.9 kg 77.9 kg    Examination:  General exam: Chronic ill appearing, trach Respiratory system: BL air movement.  Cardiovascular system:  S 1, S 2 RRR Gastrointestinal system: BS present,  soft, nt, peg in place  Central nervous system: Alert, Quadriplegic Extremities: No edema   Data Reviewed: I have personally reviewed following labs and imaging studies  CBC: Recent Labs  Lab 10/02/20 1256 10/03/20 0341 10/04/20 0341 10/05/20 0006 10/05/20 2000 10/06/20 0429  WBC 21.9* 25.6* 21.2* 18.4*  --  20.9*  NEUTROABS  --   --  16.7*  --   --  15.6*  HGB 8.3* 8.1* 7.7* 7.1* 8.6* 9.1*  HCT 26.0* 25.9* 24.0* 22.4* 26.4* 27.8*  MCV 78.3* 78.2* 76.9* 77.0*  --  77.7*  PLT 450* 442* 413* 434*  --  449*    Basic Metabolic Panel: Recent Labs  Lab 10/01/20 0325 10/02/20 1256 10/03/20 0341 10/04/20 0341 10/05/20 0006 10/06/20 0429 10/07/20 0731  NA 130* 129* 132* 130* 130* 131* 131*  K 5.1 5.5* 3.5 3.7 4.1 3.1* 3.9  CL 94* 92* 94* 93*  93* 93* 92*  CO2 22 21* 25 24 23 25 23   GLUCOSE 146* 185* 200* 141* 121* 136* 187*  BUN 59* 93* 44* 74* 96* 64* 97*  CREATININE 1.00 1.39* 0.89 1.25* 1.48* 1.17 1.64*  CALCIUM 8.7* 9.2 8.9 9.1 9.2 9.2 9.3  MG 2.1  --   --  2.2  --   --   --   PHOS  --  7.3*  --   --  6.3*  --   --     GFR: Estimated Creatinine Clearance: 41.1 mL/min (A) (by C-G formula based on SCr of 1.64 mg/dL (H)). Liver Function Tests: Recent Labs  Lab 10/02/20 1256 10/04/20 0341 10/05/20 0006 10/06/20 0429  AST  --  24  --  27  ALT  --  16  --  14  ALKPHOS  --  169*  --  170*  BILITOT  --  0.8  --  1.0  PROT  --  7.0  --  7.5  ALBUMIN 1.7* 1.7* 1.6* 1.8*    No results for input(s): LIPASE, AMYLASE in the last 168 hours. No results for input(s): AMMONIA in the last 168 hours. Coagulation Profile: No results for input(s): INR, PROTIME in the last 168 hours. Cardiac Enzymes: No results for input(s): CKTOTAL, CKMB, CKMBINDEX, TROPONINI in the last 168 hours. BNP (last 3 results) No results for input(s): PROBNP in the last 8760 hours. HbA1C: No results for input(s): HGBA1C in the last 72 hours. CBG: Recent Labs  Lab 10/06/20 1539 10/06/20 1925  10/06/20 2343 10/07/20 0329 10/07/20 0728  GLUCAP 96 134* 130* 144* 156*    Lipid Profile: No results for input(s): CHOL, HDL, LDLCALC, TRIG, CHOLHDL, LDLDIRECT in the last 72 hours. Thyroid Function Tests: No results for input(s): TSH, T4TOTAL, FREET4, T3FREE, THYROIDAB in the last 72 hours. Anemia Panel: No results for input(s): VITAMINB12, FOLATE, FERRITIN, TIBC, IRON, RETICCTPCT in the last 72 hours. Sepsis Labs: No results for input(s): PROCALCITON, LATICACIDVEN in the last 168 hours.   Recent Results (from the past 240 hour(s))  Gastrointestinal Panel by PCR , Stool     Status: None   Collection Time: 09/28/20  1:16 PM   Specimen: Stool  Result Value Ref Range Status   Campylobacter species NOT DETECTED NOT DETECTED Final   Plesimonas shigelloides NOT DETECTED NOT DETECTED Final   Salmonella species NOT DETECTED NOT DETECTED Final   Yersinia enterocolitica NOT DETECTED NOT DETECTED Final   Vibrio species NOT DETECTED NOT DETECTED Final   Vibrio cholerae NOT DETECTED NOT DETECTED Final   Enteroaggregative E coli (EAEC) NOT DETECTED NOT DETECTED Final   Enteropathogenic E coli (EPEC) NOT DETECTED NOT DETECTED Final   Enterotoxigenic E coli (ETEC) NOT DETECTED NOT DETECTED Final   Shiga like toxin producing E coli (STEC) NOT DETECTED NOT DETECTED Final   Shigella/Enteroinvasive E coli (EIEC) NOT DETECTED NOT DETECTED Final   Cryptosporidium NOT DETECTED NOT DETECTED Final   Cyclospora cayetanensis NOT DETECTED NOT DETECTED Final   Entamoeba histolytica NOT DETECTED NOT DETECTED Final   Giardia lamblia NOT DETECTED NOT DETECTED Final   Adenovirus F40/41 NOT DETECTED NOT DETECTED Final   Astrovirus NOT DETECTED NOT DETECTED Final   Norovirus GI/GII NOT DETECTED NOT DETECTED Final   Rotavirus A NOT DETECTED NOT DETECTED Final   Sapovirus (I, II, IV, and V) NOT DETECTED NOT DETECTED Final    Comment: Performed at [facility], [address] [place], [place]  [address]  Radiology Studies: No results found.      Scheduled Meds:  amiodarone  200 mg Per Tube Daily   apixaban  5 mg Per Tube BID   buPROPion  75 mg Per Tube BID   chlorhexidine gluconate (MEDLINE KIT)  15 mL Mouth Rinse BID   Chlorhexidine Gluconate Cloth  6 each Topical Q0600   Chlorhexidine Gluconate Cloth  6 each Topical Q0600   darbepoetin (ARANESP) injection - DIALYSIS  100 mcg Intravenous Q Tue-HD   feeding supplement (PROSource TF)  45 mL Per Tube TID   guaiFENesin  5 mL Per Tube BID   insulin aspart  0-9 Units Subcutaneous Q4H   insulin glargine-yfgn  30 Units Subcutaneous Daily   lidocaine  1 patch Transdermal Q24H   mouth rinse  15 mL Mouth Rinse 10 times per day   metaxalone  800 mg Per Tube TID   midodrine  2.5 mg Per Tube TID WC   multivitamin  1 tablet Per Tube QHS   oxyCODONE  5 mg Per Tube Q4H   pantoprazole sodium  40 mg Per Tube Daily   sertraline  100 mg Per Tube Daily   simethicone  40 mg Per Tube QID   sodium chloride flush  10-40 mL Intracatheter Q12H   Continuous Infusions:  sodium chloride 10 mL/hr at 09/13/2020 1429   sodium chloride 500 mL (09/01/20 1408)   sodium chloride     sodium chloride     sodium chloride     sodium chloride     sodium chloride     sodium chloride     feeding supplement (NEPRO CARB STEADY) 1,000 mL (10/06/20 1321)     LOS: 88 days    Time spent: 35 minutes.     Elmarie Shiley, MD [facility]   If 7PM-7AM, please contact night-coverage [email or web address]  10/07/2020, 10:20 AM

## 2020-10-07 NOTE — Progress Notes (Signed)
   10/07/20 1620  Vitals  Temp 98.1 F (36.7 C)  Temp Source Oral  BP 121/81  MAP (mmHg) 94  BP Location Left Leg  BP Method Automatic  Patient Position (if appropriate) Lying  Pulse Rate 95  Pulse Rate Source Monitor  ECG Heart Rate (!) 103  Resp 20  Oxygen Therapy  SpO2 97 %  O2 Device Ventilator  Pain Assessment  Pain Scale Faces  Faces Pain Scale 0  Dialysis Weight  Weight 76.5 kg  Type of Weight Post-Dialysis  Post-Hemodialysis Assessment  Rinseback Volume (mL) 250 mL  KECN 192 V  Dialyzer Clearance Lightly streaked  Duration of HD Treatment -hour(s) 3 hour(s)  Hemodialysis Intake (mL) 500 mL  UF Total -Machine (mL) 3300 mL  Net UF (mL) 2800 mL  Tolerated HD Treatment Yes  Post-Hemodialysis Comments tx complete-pt stable  Fistula / Graft Left Upper arm Arteriovenous vein graft  Placement Date/Time: 08/13/2020 1245   Placed prior to admission: No  Orientation: Left  Access Location: Upper arm  Access Type: Arteriovenous vein graft  Site Condition No complications  Fistula / Graft Assessment Thrill;Bruit  Status Deaccessed  Hemodialysis Catheter Right Internal jugular Double lumen Permanent (Tunneled)  Placement Date/Time: 08/07/20 1032   Placed prior to admission: No  Time Out: Correct patient;Correct site;Correct procedure  Maximum sterile barrier precautions: Hand hygiene;Cap;Mask;Sterile gown;Sterile gloves;Large sterile sheet  Site Prep: Chlorh...  Site Condition No complications  Blue Lumen Status Flushed;Heparin locked;Dead end cap in place  Red Lumen Status Flushed;Dead end cap in place;Heparin locked  Catheter fill solution Heparin 1000 units/ml  Catheter fill volume (Arterial) 1.9 cc  Catheter fill volume (Venous) 1.9  Dressing Type Transparent  Dressing Status Clean;Dry;Intact  Antimicrobial disc in place? Yes  Interventions  (assessed)  Drainage Description None  Dressing Change Due 10/09/20  Post treatment catheter status Capped and Clamped  HD tx  complete-pt stable

## 2020-10-07 NOTE — Progress Notes (Signed)
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  Presented from [facility] facility with altered mental status hypotension and pneumonia resultant septic shock is admitted to the intensive care unit.  Has had multiple admissions in the past for Klebsiella, MRSA and Pseudomonas urinary tract infections and pneumonia.  His most recent admission 10/03/2020 with multiple drug-resistant Klebsiella and right ureteral calculus requiring double-J stent.  Renal replacement therapy was initiated 07/18/2020.     Underwent successful dialysis 10/05/2020 with 2 L removed.  Blood pressure 117/69 pulse 82 temperature 97.9 O2 sats 100% FiO2 40  Sodium 131 potassium 3.9 chloride 92 CO2 23 BUN 97 creatinine 1.64 glucose 187  Minimal recorded urine output     Objective:  Vital signs in last 24 hours:  Temp:  [97.9 F (36.6 C)-98.4 F (36.9 C)] 97.9 F (36.6 C) (09/25 0728) Pulse Rate:  [79-92] 84 (09/25 0600) Resp:  [14-21] 20 (09/25 0600) BP: (91-147)/(59-81) 117/69 (09/25 0833) SpO2:  [98 %-100 %] 100 % (09/25 0833) FiO2 (%):  [40 %] 40 % (09/25 0833) Weight:  [77.9 kg] 77.9 kg (09/25 0130)  Weight change:  Filed Weights   10/05/20 0410 10/05/20 0457 10/07/20 0130  Weight: 78.3 kg 77.9 kg 77.9 kg    Intake/Output: I/O last 3 completed shifts: In: 1690 [I.V.:30; Other:130; NG/GT:1530] Out: 110 [Stool:110]   Intake/Output this shift:  Total I/O In: 10 [I.V.:10] Out: 100 [Stool:100]  General:NAD, tracheostomy on vent. Heart:RRR, s1s2 nl Lungs: trach, no increased work of breathing. Abdomen:soft, Non-tender, PEG tube and ostomy bag Extremities:Trace lower extremities edema Neuro: awake Dialysis Access: LIJ TDC, LUE AVG faint T/B+ (bruit w/ auscultation), swelling of the left upper extremity noted (stable)   Basic Metabolic  Panel: Recent Labs  Lab 10/01/20 0325 10/02/20 1256 10/03/20 0341 10/04/20 0341 10/05/20 0006 10/06/20 0429 10/07/20 0731  NA 130* 129* 132* 130* 130* 131* 131*  K 5.1 5.5* 3.5 3.7 4.1 3.1* 3.9  CL 94* 92* 94* 93* 93* 93* 92*  CO2 22 21* 25 24 23 25 23   GLUCOSE 146* 185* 200* 141* 121* 136* 187*  BUN 59* 93* 44* 74* 96* 64* 97*  CREATININE 1.00 1.39* 0.89 1.25* 1.48* 1.17 1.64*  CALCIUM 8.7* 9.2 8.9 9.1 9.2 9.2 9.3  MG 2.1  --   --  2.2  --   --   --   PHOS  --  7.3*  --   --  6.3*  --   --      Liver Function Tests: Recent Labs  Lab 10/02/20 1256 10/04/20 0341 10/05/20 0006 10/06/20 0429  AST  --  24  --  27  ALT  --  16  --  14  ALKPHOS  --  169*  --  170*  BILITOT  --  0.8  --  1.0  PROT  --  7.0  --  7.5  ALBUMIN 1.7* 1.7* 1.6* 1.8*    No results for input(s): LIPASE, AMYLASE in the last 168 hours. No results for input(s): AMMONIA in the last 168 hours.  CBC: Recent Labs  Lab 10/02/20 1256 10/03/20 0341 10/04/20 0341 10/05/20 0006 10/05/20 2000 10/06/20 0429  WBC 21.9* 25.6* 21.2* 18.4*  --  20.9*  NEUTROABS  --   --  16.7*  --   --  15.6*  HGB 8.3* 8.1* 7.7* 7.1* 8.6* 9.1*  HCT 26.0* 25.9*  24.0* 22.4* 26.4* 27.8*  MCV 78.3* 78.2* 76.9* 77.0*  --  77.7*  PLT 450* 442* 413* 434*  --  449*     Cardiac Enzymes: No results for input(s): CKTOTAL, CKMB, CKMBINDEX, TROPONINI in the last 168 hours.  BNP: Invalid input(s): POCBNP  CBG: Recent Labs  Lab 10/06/20 1539 10/06/20 1925 10/06/20 2343 10/07/20 0329 10/07/20 0728  GLUCAP 96 134* 130* 144* 156*     Microbiology: Results for orders placed or performed during the hospital encounter of 06/17/2020  Urine culture     Status: Abnormal   Collection Time: 06/16/2020  6:24 PM   Specimen: Urine, Random  Result Value Ref Range Status   Specimen Description URINE, RANDOM  Final   Special Requests   Final    NONE Performed at [facility], [place] [address] [place], [place] [address]     Culture MULTIPLE SPECIES PRESENT, SUGGEST RECOLLECTION (A)  Final   Report Status 07/13/2020 FINAL  Final  Culture, blood (routine x 2)     Status: Abnormal   Collection Time: 06/14/2020  6:42 PM   Specimen: BLOOD LEFT FOREARM  Result Value Ref Range Status   Specimen Description BLOOD LEFT FOREARM  Final   Special Requests   Final    BOTTLES DRAWN AEROBIC AND ANAEROBIC Blood Culture results may not be optimal due to an inadequate volume of blood received in culture bottles   Culture  Setup Time   Final    ANAEROBIC BOTTLE ONLY GRAM POSITIVE COCCI CRITICAL RESULT CALLED TO, READ BACK BY AND VERIFIED WITH: G ABBOTT PHARMD 07/13/20 0207 JDW    Culture (A)  Final    STAPHYLOCOCCUS AURICULARIS THE SIGNIFICANCE OF ISOLATING THIS ORGANISM FROM A SINGLE SET OF BLOOD CULTURES WHEN MULTIPLE SETS ARE DRAWN IS UNCERTAIN. PLEASE NOTIFY THE MICROBIOLOGY DEPARTMENT WITHIN ONE WEEK IF SPECIATION AND SENSITIVITIES ARE REQUIRED. Performed at [facility], [place] [address], [place], [place] [address]    Report Status 07/14/2020 FINAL  Final  Blood Culture ID Panel (Reflexed)     Status: Abnormal   Collection Time: 06/30/2020  6:42 PM  Result Value Ref Range Status   Enterococcus faecalis NOT DETECTED NOT DETECTED Final   Enterococcus Faecium NOT DETECTED NOT DETECTED Final   Listeria monocytogenes NOT DETECTED NOT DETECTED Final   Staphylococcus species DETECTED (A) NOT DETECTED Final    Comment: CRITICAL RESULT CALLED TO, READ BACK BY AND VERIFIED WITH: G ABBOTT PHARMD 07/13/20 0207 JDW    Staphylococcus aureus (BCID) NOT DETECTED NOT DETECTED Final   Staphylococcus epidermidis NOT DETECTED NOT DETECTED Final   Staphylococcus lugdunensis NOT DETECTED NOT DETECTED Final   Streptococcus species NOT DETECTED NOT DETECTED Final   Streptococcus agalactiae NOT DETECTED NOT DETECTED Final   Streptococcus pneumoniae NOT DETECTED NOT DETECTED Final   Streptococcus pyogenes NOT DETECTED NOT DETECTED Final    A.calcoaceticus-baumannii NOT DETECTED NOT DETECTED Final   Bacteroides fragilis NOT DETECTED NOT DETECTED Final   Enterobacterales NOT DETECTED NOT DETECTED Final   Enterobacter cloacae complex NOT DETECTED NOT DETECTED Final   Escherichia coli NOT DETECTED NOT DETECTED Final   Klebsiella aerogenes NOT DETECTED NOT DETECTED Final   Klebsiella oxytoca NOT DETECTED NOT DETECTED Final   Klebsiella pneumoniae NOT DETECTED NOT DETECTED Final   Proteus species NOT DETECTED NOT DETECTED Final   Salmonella species NOT DETECTED NOT DETECTED Final   Serratia marcescens NOT DETECTED NOT DETECTED Final   Haemophilus influenzae NOT DETECTED NOT DETECTED Final  Neisseria meningitidis NOT DETECTED NOT DETECTED Final   Pseudomonas aeruginosa NOT DETECTED NOT DETECTED Final   Stenotrophomonas maltophilia NOT DETECTED NOT DETECTED Final   Candida albicans NOT DETECTED NOT DETECTED Final   Candida auris NOT DETECTED NOT DETECTED Final   Candida glabrata NOT DETECTED NOT DETECTED Final   Candida krusei NOT DETECTED NOT DETECTED Final   Candida parapsilosis NOT DETECTED NOT DETECTED Final   Candida tropicalis NOT DETECTED NOT DETECTED Final   Cryptococcus neoformans/gattii NOT DETECTED NOT DETECTED Final    Comment: Performed at [facility], [place] [address], [place], [place] [address]  Resp Panel by RT-PCR (Flu A&B, Covid) Nasopharyngeal Swab     Status: None   Collection Time: 07/05/2020  7:22 PM   Specimen: Nasopharyngeal Swab; Nasopharyngeal(NP) swabs in vial transport medium  Result Value Ref Range Status   SARS Coronavirus 2 by RT PCR NEGATIVE NEGATIVE Final    Comment: (NOTE) SARS-CoV-2 target nucleic acids are NOT DETECTED.  The SARS-CoV-2 RNA is generally detectable in upper respiratory specimens during the acute phase of infection. The lowest concentration of SARS-CoV-2 viral copies this assay can detect is 138 copies/mL. A negative result does not preclude SARS-Cov-2 infection and  should not be used as the sole basis for treatment or other patient management decisions. A negative result may occur with  improper specimen collection/handling, submission of specimen other than nasopharyngeal swab, presence of viral mutation(s) within the areas targeted by this assay, and inadequate number of viral copies(<138 copies/mL). A negative result must be combined with clinical observations, patient history, and epidemiological information. The expected result is Negative.  Fact Sheet for Patients:  EntrepreneurPulse.com.au  Fact Sheet for Healthcare Providers:  IncredibleEmployment.be  This test is no t yet approved or cleared by the [place] FDA and  has been authorized for detection and/or diagnosis of SARS-CoV-2 by FDA under an Emergency Use Authorization (EUA). This EUA will remain  in effect (meaning this test can be used) for the duration of the COVID-19 declaration under Section 564(b)(1) of the Act, 21 U.S.C.section 360bbb-3(b)(1), unless the authorization is terminated  or revoked sooner.       Influenza A by PCR NEGATIVE NEGATIVE Final   Influenza B by PCR NEGATIVE NEGATIVE Final    Comment: (NOTE) The Xpert Xpress SARS-CoV-2/FLU/RSV plus assay is intended as an aid in the diagnosis of influenza from Nasopharyngeal swab specimens and should not be used as a sole basis for treatment. Nasal washings and aspirates are unacceptable for Xpert Xpress SARS-CoV-2/FLU/RSV testing.  Fact Sheet for Patients: EntrepreneurPulse.com.au  Fact Sheet for Healthcare Providers: IncredibleEmployment.be  This test is not yet approved or cleared by the [place] FDA and has been authorized for detection and/or diagnosis of SARS-CoV-2 by FDA under an Emergency Use Authorization (EUA). This EUA will remain in effect (meaning this test can be used) for the duration of the COVID-19 declaration  under Section 564(b)(1) of the Act, 21 U.S.C. section 360bbb-3(b)(1), unless the authorization is terminated or revoked.  Performed at [facility], [place] [address], [place], [place] [address]   Culture, blood (routine x 2)     Status: None   Collection Time: 07/09/2020  7:23 PM   Specimen: BLOOD  Result Value Ref Range Status   Specimen Description BLOOD RIGHT ANTECUBITAL  Final   Special Requests   Final    BOTTLES DRAWN AEROBIC AND ANAEROBIC Blood Culture adequate volume   Culture   Final    NO  GROWTH 5 DAYS Performed at [facility], [place] [address] [place], [place] [address]    Report Status 07/16/2020 FINAL  Final  MRSA Next Gen by PCR, Nasal     Status: Abnormal   Collection Time: 06/23/2020 10:29 PM   Specimen: Nasal Mucosa; Nasal Swab  Result Value Ref Range Status   MRSA by PCR Next Gen DETECTED (A) NOT DETECTED Final    Comment: RESULT CALLED TO, READ BACK BY AND VERIFIED WITH: Ayesha Rumpf RN 07/12/20 0428 JDW (NOTE) The GeneXpert MRSA Assay (FDA approved for NASAL specimens only), is one component of a comprehensive MRSA colonization surveillance program. It is not intended to diagnose MRSA infection nor to guide or monitor treatment for MRSA infections. Test performance is not FDA approved in patients less than 32 years old. Performed at [facility], [place] [address] [place], [place] [address]   Culture, Respiratory w Gram Stain     Status: None   Collection Time: 07/12/20  3:48 PM   Specimen: Tracheal Aspirate; Respiratory  Result Value Ref Range Status   Specimen Description TRACHEAL ASPIRATE  Final   Special Requests NONE  Final   Gram Stain   Final    MODERATE WBC PRESENT, PREDOMINANTLY PMN FEW GRAM NEGATIVE RODS FEW GRAM POSITIVE COCCI IN PAIRS    Culture   Final    MODERATE ACINETOBACTER BAUMANNII MULTI-DRUG RESISTANT ORGANISM RESULT CALLED TO, READ BACK BY AND VERIFIED WITH: RN J.BEABRAUT ON [id number] AT 1410 BY E.PARRISH Performed at [facility], [place] [address], [place], [place] [address]    Report Status 07/14/2020 FINAL  Final   Organism ID, Bacteria ACINETOBACTER BAUMANNII  Final      Susceptibility   Acinetobacter baumannii - MIC*    CEFTAZIDIME 32 RESISTANT Resistant     CIPROFLOXACIN >=4 RESISTANT Resistant     GENTAMICIN >=16 RESISTANT Resistant     IMIPENEM >=16 RESISTANT Resistant     PIP/TAZO >=128 RESISTANT Resistant     TRIMETH/SULFA >=320 RESISTANT Resistant     AMPICILLIN/SULBACTAM 8 SENSITIVE Sensitive     * MODERATE ACINETOBACTER BAUMANNII  Culture, Urine     Status: Abnormal   Collection Time: 07/13/20 11:26 AM   Specimen: Urine, Random  Result Value Ref Range Status   Specimen Description URINE, RANDOM  Final   Special Requests   Final    NONE Performed at [facility], [place] [address], [place], [place] [address]    Culture MULTIPLE SPECIES PRESENT, SUGGEST RECOLLECTION (A)  Final   Report Status 07/14/2020 FINAL  Final  Culture, blood (routine x 2)     Status: None   Collection Time: 07/24/20  3:53 PM   Specimen: BLOOD RIGHT HAND  Result Value Ref Range Status   Specimen Description BLOOD RIGHT HAND  Final   Special Requests   Final    BOTTLES DRAWN AEROBIC AND ANAEROBIC Blood Culture results may not be optimal due to an inadequate volume of blood received in culture bottles   Culture   Final    NO GROWTH 5 DAYS Performed at [facility], [place] [address] [place], [place] [address]    Report Status 07/29/2020 FINAL  Final  Culture, blood (routine x 2)     Status: None   Collection Time: 07/24/20  4:10 PM   Specimen: BLOOD LEFT HAND  Result Value Ref Range Status   Specimen Description BLOOD LEFT HAND  Final   Special Requests  Final    BOTTLES DRAWN AEROBIC AND ANAEROBIC Blood Culture adequate volume   Culture   Final    NO GROWTH 5 DAYS Performed at [facility], [place] [address], [place], [place] [address]    Report Status 07/29/2020 FINAL  Final  MRSA Next Gen  by PCR, Nasal     Status: None   Collection Time: 08/23/20  4:17 PM   Specimen: Nasal Mucosa; Nasal Swab  Result Value Ref Range Status   MRSA by PCR Next Gen NOT DETECTED NOT DETECTED Final    Comment: (NOTE) The GeneXpert MRSA Assay (FDA approved for NASAL specimens only), is one component of a comprehensive MRSA colonization surveillance program. It is not intended to diagnose MRSA infection nor to guide or monitor treatment for MRSA infections. Test performance is not FDA approved in patients less than 78 years old. Performed at [facility], [place] [address], [place], [place] [address]   Culture, blood (routine x 2)     Status: None   Collection Time: 08/26/20  9:32 AM   Specimen: BLOOD RIGHT HAND  Result Value Ref Range Status   Specimen Description BLOOD RIGHT HAND  Final   Special Requests   Final    BOTTLES DRAWN AEROBIC ONLY Blood Culture results may not be optimal due to an inadequate volume of blood received in culture bottles   Culture   Final    NO GROWTH 5 DAYS Performed at [facility], [place] [address], [place], [place] [address]    Report Status 08/31/2020 FINAL  Final  Culture, blood (routine x 2)     Status: Abnormal   Collection Time: 08/26/20  9:38 AM   Specimen: BLOOD LEFT HAND  Result Value Ref Range Status   Specimen Description BLOOD LEFT HAND  Final   Special Requests   Final    BOTTLES DRAWN AEROBIC AND ANAEROBIC Blood Culture adequate volume   Culture  Setup Time   Final    GRAM POSITIVE COCCI IN CLUSTERS AEROBIC BOTTLE ONLY CRITICAL RESULT CALLED TO, READ BACK BY AND VERIFIED WITH: PHARM D A.LAWLESS ON [id number] AT 0945 BY E.PARRISH    Culture (A)  Final    STAPHYLOCOCCUS EPIDERMIDIS THE SIGNIFICANCE OF ISOLATING THIS ORGANISM FROM A SINGLE SET OF BLOOD CULTURES WHEN MULTIPLE SETS ARE DRAWN IS UNCERTAIN. PLEASE NOTIFY THE MICROBIOLOGY DEPARTMENT WITHIN ONE WEEK IF SPECIATION AND SENSITIVITIES ARE REQUIRED. Performed at [facility], [place] [address] [place], [place] [address]    Report Status 08/29/2020 FINAL  Final  Culture, Respiratory w Gram Stain     Status: None   Collection Time: 08/27/20  9:15 AM   Specimen: Tracheal Aspirate; Respiratory  Result Value Ref Range Status   Specimen Description TRACHEAL ASPIRATE  Final   Special Requests NONE  Final   Gram Stain   Final    ABUNDANT WBC PRESENT, PREDOMINANTLY PMN FEW GRAM POSITIVE COCCI FEW GRAM NEGATIVE RODS RARE GRAM POSITIVE RODS    Culture   Final    ABUNDANT ACINETOBACTER BAUMANNII MULTI-DRUG RESISTANT ORGANISM CRITICAL RESULT CALLED TO, READ BACK BY AND VERIFIED WITH: PHARMD K MCCLURE [id number] AT 1101 AM BY CM Performed at [facility], [place] [address], [place],  [address]    Report Status 08/30/2020 FINAL  Final   Organism ID, Bacteria ACINETOBACTER BAUMANNII  Final      Susceptibility   Acinetobacter baumannii - MIC*    CEFTAZIDIME 16 INTERMEDIATE Intermediate  CIPROFLOXACIN >=4 RESISTANT Resistant     GENTAMICIN >=16 RESISTANT Resistant     IMIPENEM >=16 RESISTANT Resistant     PIP/TAZO >=128 RESISTANT Resistant     TRIMETH/SULFA >=320 RESISTANT Resistant     AMPICILLIN/SULBACTAM 4 SENSITIVE Sensitive     * ABUNDANT ACINETOBACTER BAUMANNII  Surgical PCR screen     Status: None   Collection Time: 09/19/2020  9:25 AM   Specimen: Nasal Mucosa; Nasal Swab  Result Value Ref Range Status   MRSA, PCR NEGATIVE NEGATIVE Final   Staphylococcus aureus NEGATIVE NEGATIVE Final    Comment: (NOTE) The Xpert SA Assay (FDA approved for NASAL specimens in patients 50 years of age and older), is one component of a comprehensive surveillance program. It is not intended to diagnose infection nor to guide or monitor treatment. Performed at [facility], [place] [address] [place], [place] [address]   Gastrointestinal Panel by PCR , Stool     Status: None   Collection Time: 09/28/20  1:16 PM   Specimen: Stool  Result Value Ref Range Status    Campylobacter species NOT DETECTED NOT DETECTED Final   Plesimonas shigelloides NOT DETECTED NOT DETECTED Final   Salmonella species NOT DETECTED NOT DETECTED Final   Yersinia enterocolitica NOT DETECTED NOT DETECTED Final   Vibrio species NOT DETECTED NOT DETECTED Final   Vibrio cholerae NOT DETECTED NOT DETECTED Final   Enteroaggregative E coli (EAEC) NOT DETECTED NOT DETECTED Final   Enteropathogenic E coli (EPEC) NOT DETECTED NOT DETECTED Final   Enterotoxigenic E coli (ETEC) NOT DETECTED NOT DETECTED Final   Shiga like toxin producing E coli (STEC) NOT DETECTED NOT DETECTED Final   Shigella/Enteroinvasive E coli (EIEC) NOT DETECTED NOT DETECTED Final   Cryptosporidium NOT DETECTED NOT DETECTED Final   Cyclospora cayetanensis NOT DETECTED NOT DETECTED Final   Entamoeba histolytica NOT DETECTED NOT DETECTED Final   Giardia lamblia NOT DETECTED NOT DETECTED Final   Adenovirus F40/41 NOT DETECTED NOT DETECTED Final   Astrovirus NOT DETECTED NOT DETECTED Final   Norovirus GI/GII NOT DETECTED NOT DETECTED Final   Rotavirus A NOT DETECTED NOT DETECTED Final   Sapovirus (I, II, IV, and V) NOT DETECTED NOT DETECTED Final    Comment: Performed at [facility], [place] [place], [place] [address]    Coagulation Studies: No results for input(s): LABPROT, INR in the last 72 hours.  Urinalysis: No results for input(s): COLORURINE, LABSPEC, PHURINE, GLUCOSEU, HGBUR, BILIRUBINUR, KETONESUR, PROTEINUR, UROBILINOGEN, NITRITE, LEUKOCYTESUR in the last 72 hours.  Invalid input(s): APPERANCEUR    Imaging: No results found.   Medications:    sodium chloride 10 mL/hr at 09/16/2020 1429   sodium chloride 500 mL (09/01/20 1408)   sodium chloride     sodium chloride     sodium chloride     sodium chloride     sodium chloride     sodium chloride     feeding supplement (NEPRO CARB STEADY) 1,000 mL (10/06/20 1321)    amiodarone  200 mg Per Tube Daily   apixaban  5 mg Per Tube  BID   buPROPion  75 mg Per Tube BID   chlorhexidine gluconate (MEDLINE KIT)  15 mL Mouth Rinse BID   Chlorhexidine Gluconate Cloth  6 each Topical Q0600   darbepoetin (ARANESP) injection - DIALYSIS  100 mcg Intravenous Q Tue-HD   feeding supplement (PROSource TF)  45 mL Per Tube TID   guaiFENesin  5 mL Per Tube BID  insulin aspart  0-9 Units Subcutaneous Q4H   insulin glargine-yfgn  30 Units Subcutaneous Daily   lidocaine  1 patch Transdermal Q24H   mouth rinse  15 mL Mouth Rinse 10 times per day   metaxalone  800 mg Per Tube TID   midodrine  2.5 mg Per Tube TID WC   multivitamin  1 tablet Per Tube QHS   oxyCODONE  5 mg Per Tube Q4H   pantoprazole sodium  40 mg Per Tube Daily   sertraline  100 mg Per Tube Daily   simethicone  40 mg Per Tube QID   sodium chloride flush  10-40 mL Intracatheter Q12H   sodium chloride, sodium chloride, sodium chloride, sodium chloride, sodium chloride, sodium chloride, sodium chloride, alteplase, glycopyrrolate, heparin, heparin, heparin sodium (porcine), ipratropium-albuterol, lidocaine (PF), lidocaine-prilocaine, midodrine, morphine injection, pentafluoroprop-tetrafluoroeth, phenol, polyethylene glycol, sodium chloride flush  Assessment/ Plan:  1.Acute kidney Injury-prolonged dialysis dependency, now progressed to ESRD:  ATN from septic shock-without any evidence of renal recovery to date . initiated on CRRT 07/18/20 and transitioned to IHD.  Has [facility] & AVG, he received dialysis after midnight 10/05/2020 although was scheduled for 10/04/2020.  We will be able to run him 10/07/2020 due to high dialysis census   2 Vascular access - had left forearm AVG placed 08/27/2020 but unfortunately had clotted.  s/p thrombectomy and revision 9/2, graft can be used 4 weeks thereafter.  Swelling of left upper extremity noted continue arm elevation and supportive care.  Continue to follow  3 Septic shock/ventilator associated pneumonia due to recurrent Acinetobacter infection: Off  antibiotics and stable hemodynamics.   4 Leukocytosis -persistent, will defer to primary service for further mgmt   5 Chronic respiratory failure: Status post tracheostomy with ventilator management per CCM.   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   7 Chronic kidney disease/metabolic bone disease: Calcium and phosphorus level under control with hemodialysis, off binders   8 Anemia: contiue ESA, monitor Hb.  Transfuse as needed.  9 Acute metabolic encephalopathy: Continue management as above.,  Supportive care.   10 Disposition - pt cannot return to [facility] now that he is ESRD. He is going to [facility] in [place], [place]. He needs to have [place] Medicaid & his AVG needs to be mature prior to being accepted. Other possible barrier is pt is a permanent trach & vent pt.    LOS: [place] @TODAY @9 :12 AM

## 2020-10-07 NOTE — Progress Notes (Signed)
Assisted wife with tele-visit via elink

## 2020-10-08 ENCOUNTER — Inpatient Hospital Stay (HOSPITAL_COMMUNITY): Payer: 59

## 2020-10-08 DIAGNOSIS — J9601 Acute respiratory failure with hypoxia: Secondary | ICD-10-CM | POA: Diagnosis not present

## 2020-10-08 LAB — CBC
HCT: 25.9 % — ABNORMAL LOW (ref 39.0–52.0)
Hemoglobin: 8.5 g/dL — ABNORMAL LOW (ref 13.0–17.0)
MCH: 24.7 pg — ABNORMAL LOW (ref 26.0–34.0)
MCHC: 32.8 g/dL (ref 30.0–36.0)
MCV: 75.3 fL — ABNORMAL LOW (ref 80.0–100.0)
Platelets: 464 10*3/uL — ABNORMAL HIGH (ref 150–400)
RBC: 3.44 MIL/uL — ABNORMAL LOW (ref 4.22–5.81)
RDW: 19.9 % — ABNORMAL HIGH (ref 11.5–15.5)
WBC: 19.1 10*3/uL — ABNORMAL HIGH (ref 4.0–10.5)
nRBC: 1.2 % — ABNORMAL HIGH (ref 0.0–0.2)

## 2020-10-08 LAB — GLUCOSE, CAPILLARY
Glucose-Capillary: 125 mg/dL — ABNORMAL HIGH (ref 70–99)
Glucose-Capillary: 130 mg/dL — ABNORMAL HIGH (ref 70–99)
Glucose-Capillary: 135 mg/dL — ABNORMAL HIGH (ref 70–99)
Glucose-Capillary: 137 mg/dL — ABNORMAL HIGH (ref 70–99)
Glucose-Capillary: 151 mg/dL — ABNORMAL HIGH (ref 70–99)
Glucose-Capillary: 153 mg/dL — ABNORMAL HIGH (ref 70–99)

## 2020-10-08 LAB — BASIC METABOLIC PANEL
Anion gap: 15 (ref 5–15)
BUN: 65 mg/dL — ABNORMAL HIGH (ref 8–23)
CO2: 25 mmol/L (ref 22–32)
Calcium: 9.3 mg/dL (ref 8.9–10.3)
Chloride: 94 mmol/L — ABNORMAL LOW (ref 98–111)
Creatinine, Ser: 1.22 mg/dL (ref 0.61–1.24)
GFR, Estimated: >60 mL/min (ref >60)
Glucose, Bld: 148 mg/dL — ABNORMAL HIGH (ref 70–99)
Potassium: 3.3 mmol/L — ABNORMAL LOW (ref 3.5–5.1)
Sodium: 134 mmol/L — ABNORMAL LOW (ref 135–145)

## 2020-10-08 MED ORDER — DAKINS (1/4 STRENGTH) 0.125 % EX SOLN
CUTANEOUS | Status: AC
Start: 2020-10-08 — End: 2020-10-12
  Administered 2020-10-10: 1 via TOPICAL
  Filled 2020-10-08 (×2): qty 473

## 2020-10-08 MED ORDER — POTASSIUM CHLORIDE 20 MEQ PO PACK
20.0000 meq | PACK | Freq: Once | ORAL | Status: AC
  Administered 2020-10-08: 20 meq via ORAL
  Filled 2020-10-08: qty 1

## 2020-10-08 NOTE — Progress Notes (Signed)
[facility] PROGRESS NOTE  Luis Orr HEN:[id number] DOB: 1950/05/26 DOA: 07/08/2020 PCP: Townsend Roger, MD  Status: Remains inpatient appropriate because:Hemodynamically unstable, Persistent severe electrolyte disturbances, Unsafe d/c plan, and Inpatient level of care appropriate due to severity of illness  Dispo: The patient is from: [facility] vent capable              Anticipated d/c is to: SNF Vent capable w/ access to in facility/stretcher based HD-a facility in [place] has been located in preauthorization in progress.              Patient currently is not medically stable to d/c.   Difficult to place patient Yes    Level of care: ICU  Code Status: Partial (no CPR and no defibrillation or cardioversion) Family Communication: Wife June at bedside on 9/21 DVT prophylaxis: IV Heparin COVID vaccination status: Unknown    HPI:  70 y.o. 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 Patient presented secondary to altered mental status and hypotension with recurrent pneumonia requiring vasopressor support and ICU admission.he has had multiple recent admissions for sepsis secondary to various infections including UTIs, pneumonias and bacteremia in setting of MDR Klebsiella, MRSA and pseudomonas. Most recent admission September 2021 in setting of sepsis due to MDR Klebsiella with right ureteral calculus s/p double J ureteral stent placement and treated with meropenem x 10days. He was discharged back to [facility].  ICU significant events: 6/29 admitted to ICU for septic shock on levo to maintain MAP>65, continued on vent support 6/29 Blood Cx>> staph species in 1 of 4 cultures drawn>> suspect contaminant 6/30 Off pressors 7/2 ID Consult for MDR acinetobacter in trach asp, Meropenem changed to unasyn 7/2 PRBC transfusion, iron replacement 7/5 Pressors added again. Central line placed. Drainage from gastrostomy  tube >> CT Abd without fluid collection around gastrostomy tube but with diffuse body wall edema. Echo EF 50-55%, mild LVH 7/6 Renal Replacement Therapy ; Code status changed to partial Code (DNR) 7/7 Transfuse 1U PRBC 7/9 add solu cortef 7/10 off pressors, weaning stress steroids 7/11 Pressors added back on again 7/12 ID consult due to rising WBC, hypothermia, hypotension concern for worsening infection. Vancomycin added. Repeat blood cx collected. 7/15 CRRT stopped 7/17 Restarted on low dose levo. No evidence of new infection. Held off on antibiotics. Nephrology contacted family and decision was made to continue iHD this week to allow additional time for renal recovery. Patient is not the best long term iHD candidate but family wants aggressive care 7/18 plans for iHD, tolerated with 2L removed 719 slight drop in hgb to 6.9 will transfuse 1 unit PRBC 7/22 > 7/26: Tolerating HD. TOC consulted for placement. Will likely need out of state placement. 8/15 WBCs greater than 28,000, hypoglycemia, procalcitonin >18, chest x-ray consistent with pneumonia, recent blood cultures consistent with contamination.  Unasyn IV resumed after sputum culture obtained 8/16 consultation placed with ethics committee regarding evaluation for futility of care 8/16 Sputum cx + for Acinetobacter with sensitivities pending 8/17 extensive conversation held with patient's wife regarding patient's poor prognosis and reemergence of Acinetobacter VAP.  Also discussed with her that patient has been able to convey to multiple staff that he does not want to continue on the ventilator or with dialysis.  Wife reports that the patient does not understand what he is saying, she is the POA and that she wants to continue aggressive care. 8/18 patient now telling nephrology, myself and the rest  of the staff he replies yes to wanting to remain on dialysis and on the ventilator and this direct question is asked. 8/18 Acinetobacter sensitive  to Unasyn only.  Discussed with ID/Dr. Juleen China.  Plan is to administer antibiotics for 7-day since patient has not had any increased O2 needs and remains afebrile.  If any concerns over worsening symptoms can give an additional 3 days. Developed moderate bleeding around tracheostomy site on 8/19.  Likely related to combination of Eliquis and heparin required for CRRT.  We will hold Eliquis for now and monitor degree of bleeding Eliquis resumed on 8/22.  Overnight into 8/23 patient bit his tongue and had bleeding.  Also had some bleeding around trach likely from oral bleeding.  Also though did have bleeding from old central line site. 8/23 had transient bleeding around trach and prior central line insertion site overnight.  Patient had bitten tongue and this likely explain the bleeding.  This morning patient was not having any bleeding.  On 8/25 had redeveloped slow ooze bleeding from mouth and trach.  Eliquis discontinued in favor of IV heparin especially given patient will undergo surgical procedure for vascular access on 8/29 8/30 Left FA AVG occluded 9/2 status post thrombectomy and revision of LUE AV graft 9/4 LUE edema with negative venous duplex. 9/5 heparin discontinued and Eliquis resumed    Subjective: Patient is awake.  Makes eye contact briefly without attempts to meaningfully interact.  Objective: Vitals:   10/08/20 0500 10/08/20 0600  BP: (!) 108/59 (!) 96/57  Pulse: 85 83  Resp: 20 20  Temp:    SpO2: 99% 98%    Intake/Output Summary (Last 24 hours) at 10/08/2020 0732 Last data filed at 10/08/2020 0600 Gross per 24 hour  Intake 1115 ml  Output 2800 ml  Net -1685 ml   Filed Weights   10/07/20 1252 10/07/20 1620 10/08/20 0500  Weight: 77.9 kg 76.5 kg 78.8 kg    Exam:  Constitutional: Awake, flat affect-is to be deferred Respiratory: Trach: 6.0 XLT cuffed, Vent settings: PRVC mode, Fio2 40%, rate 20, VT 510, PEEP 5; anterior lung sounds with coarse bilateral rhonchi  somewhat diminished in the bases, otherwise stable Cardiovascular: Sinus rhythm without any notable ectopy on telemetry, normotensive -AVG palpable thrill. Abdomen: LBM 9/23, PEG tube,  Left lower quadrant colostomy with small amount of stool, abdomen nontender with normoactive bowel sounds.  Neurologic: Chronic quadriplegia. No sensation below shoulders. Psychiatric: Awake, and flat affect.  Does not interact.   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 - ventilator management per PCCM  Recurrent ventilator associated pneumonia 2/2 recurrent Acinetobacter/sepsis without shock has been treated x2 this admission for Acinetobacter tracheobronchitis/pneumonia    AKI with oliguria/volume overload/ New CKD requiring HD Anuric-Nephrology following, initially required CRRT and has transitioned to HD (LUE AVG functional-revised 9/2 but needs to mature x 4 weeks before can dc   Septic shock 2/2 VAP POA Presented with sepsis physiology and profound shock -resolved  Chronic thoracic back pain Continue hedger low-dose Oxy IR along with IV morphine prn breakthrough pain; continue Skelaxin and lidocaine patch  History of paroxysmal atrial fibrillation Continue amiodarone and Eliquis maintaining SR QTc 420 ms on 3/78   Acute metabolic encephalopathy Resolved-Secondary to acute illness. CT head negative for acute process.   Diabetes mellitus, type 2 Continue Semglee 30 units daily.  Continue SSI w/ CBG checks every 4 hours  Acute diverticulitis Has completed a course of Flagyl  Continues to have residual pain WBC trended down currently 15.3 from a peak of 25.4 but has started climbing again with last 2 readings 25.6 and 21.2.  This is consistent with his chronic leukocytosis throughout the hospitalization.  Nodular irregular thickening of the ascending colon-?  Neoplasm [facility] consulted.  They discussed  with his wife.  Given patient's debilitated state/diverticulitis they recommend holding off on colonoscopy for now.  They have signed off and recommend calling for any questions.   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  Plan is to discharge to facility in [place]   Microcytic anemia/Acute anemia superimposed on anemia of chronic kidney disease S/P 5 units of PRBC -Continue Aranesp and follow Hgb prn 9/23 hemoglobin slowly trending down and as of today is 7.1.  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  Right/posterior/lateral ankle unknown if present on admission.  DTI left ear, not present on admission. (For additional documentation see wound care notes) Patient recently underwent bedside debridement of sacral and heel wound.  No evidence of infection found.  Wounds now with red granular base.  Continue wound packing as prior.    Data Reviewed: Basic Metabolic Panel: Recent Labs  Lab 10/02/20 1256 10/03/20 0341 10/04/20 0341 10/05/20 0006 10/06/20 0429 10/07/20 0731 10/08/20 0606  NA 129*   < > 130* 130* 131* 131* 134*  K 5.5*   < > 3.7 4.1 3.1* 3.9 3.3*  CL 92*   < > 93* 93* 93* 92* 94*  CO2 21*   < > $R'24 23 25 23 25  'Up$ GLUCOSE 185*   < > 141* 121* 136* 187* 148*  BUN 93*   < > 74* 96* 64* 97* 65*  CREATININE 1.39*   < > 1.25* 1.48* 1.17 1.64* 1.22  CALCIUM 9.2   < > 9.1 9.2 9.2 9.3 9.3  MG  --   --  2.2  --   --   --    --   PHOS 7.3*  --   --  6.3*  --   --   --    < > = values in this interval not displayed.   Liver Function Tests: Recent Labs  Lab 10/02/20 1256 10/04/20 0341 10/05/20 0006 10/06/20 0429  AST  --  24  --  27  ALT  --  16  --  14  ALKPHOS  --  169*  --  170*  BILITOT  --  0.8  --  1.0  PROT  --  7.0  --  7.5  ALBUMIN 1.7* 1.7* 1.6* 1.8*    CBC: Recent Labs  Lab 10/04/20 0341 10/05/20 0006 10/05/20 2000 10/06/20 0429 10/07/20 1301 10/08/20 0606  WBC 21.2* 18.4*  --  20.9* 17.3* 19.1*  NEUTROABS 16.7*  --   --  15.6*  --   --   HGB 7.7* 7.1* 8.6* 9.1* 8.0* 8.5*  HCT 24.0* 22.4* 26.4* 27.8* 25.1* 25.9*  MCV 76.9* 77.0*  --  77.7* 77.5* 75.3*  PLT 413* 434*  --  449* 462*  464*   CBG: Recent Labs  Lab 10/07/20 1136 10/07/20 1523 10/07/20 1939 10/07/20 2347 10/08/20 0316  GLUCAP 157* 134* 120* 135* 153*      Scheduled Meds:  amiodarone  200 mg Per Tube Daily   apixaban  5 mg Per Tube BID   buPROPion  75 mg Per Tube BID   chlorhexidine gluconate (MEDLINE KIT)  15 mL Mouth Rinse BID   Chlorhexidine Gluconate Cloth  6 each Topical Q0600   darbepoetin (ARANESP) injection - DIALYSIS  100 mcg Intravenous Q Tue-HD   feeding supplement (PROSource TF)  45 mL Per Tube TID   guaiFENesin  5 mL Per Tube BID   insulin aspart  0-9 Units Subcutaneous Q4H   insulin glargine-yfgn  30 Units Subcutaneous Daily   lidocaine  1 patch Transdermal Q24H   mouth rinse  15 mL Mouth Rinse 10 times per day   metaxalone  800 mg Per Tube TID   midodrine  2.5 mg Per Tube TID WC   multivitamin  1 tablet Per Tube QHS   oxyCODONE  5 mg Per Tube Q4H   pantoprazole sodium  40 mg Per Tube Daily   sertraline  100 mg Per Tube Daily   simethicone  40 mg Per Tube QID   sodium chloride flush  10-40 mL Intracatheter Q12H   Continuous Infusions:  sodium chloride 10 mL/hr at 09/15/2020 1429   sodium chloride 500 mL (09/01/20 1408)   sodium chloride     sodium chloride     sodium chloride      sodium chloride     sodium chloride     sodium chloride     feeding supplement (NEPRO CARB STEADY) 1,000 mL (10/07/20 1224)    Principal Problem:   Acute respiratory failure with hypoxia (HCC) Active Problems:   Ventilator dependent (HCC)   Septic shock (HCC)   Pressure injury of skin   AKI (acute kidney injury) (HCC)   Hypotension   Goals of care, counseling/discussion   Quadriplegia (HCC)   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)   Hyperglycemia   Sepsis due to Acinetobacter baumannii ([place])   Pneumonia due to Acinetobacter species ([place])   Dysautonomia (HCC)   Chronic midline thoracic back pain   Abdominal distention   Leukocytosis   Consultants: PCCM Nephrology Infectious disease Palliative care medicine  Procedures: Echocardiogram Insertion of double-lumen HD catheter  Antibiotics: Cefepime x1 dose Meropenem 6/29 through 7/2 Vancomycin 6/29 through 6/30 Unasyn 7/2 through 7/15 Unasyn 8/15 >8/22   Time spent: 25 minutes    Erin Hearing ANP  [facility] 7 am - 330 pm/M-F for direct patient care and secure chat Please refer to Amion for contact info 89  days

## 2020-10-08 NOTE — Progress Notes (Signed)
NAME:  Luis Orr, MRN:  [id number], DOB:  [birth year], LOS: 3 ADMISSION DATE:  07/01/2020, CONSULTATION DATE:  06/16/2020 REFERRING MD:  Dr Francia Greaves - EDP CHIEF COMPLAINT:  Septic shock    History of Present Illness:  70 year old man from [facility] with history of C3-C5 compressive myelopathy with resultant functional quadriplegia and chronic trach/vent who presented to ED with altered mental status and hypotension with recurrent HCAP.  Has multiple recent admissions for sepsis secondary to various infections including UTIs, pneumonias and bacteremia in setting of MDR Klebsiella, MRSA and pseudomonas. Most recent admission September 2021 in setting of sepsis due to MDR Klebsiella with right ureteral calculus, s/p double J ureteral stent placement and treated with meropenem x 10 days.  PCCM following for chronic tracheostomy/vent management.  Pertinent Medical History:  Compressive Myelopathy with Functional Quadriplegia  Chronic Respiratory Failure s/p Trach with Vent Dependence  Anemia  DM II  HTN  Urinary Retention with chronic foley  Renal Calculi  Frequent PNA Chronic [facility] Resident   Significant Hospital Events:  6/29 admitted to ICU for septic shock on levo to maintain MAP>65, continued on vent support  6/29 Blood Cx>> staph species in 1 of 4 cultures drawn>> suspect contaminant 6/30 Off pressors 7/2 ID Consult for MDR acinetobacter in trach asp, Meropenem changed to unasyn 7/2 PRBC transfusion, iron replacement  7/5 Pressors added again. Central line placed. Drainage from gastrostomy tube >> CT Abd without fluid collection around gastrostomy tube but with diffuse body wall edema. Echo EF 50-55%, mild LVH  7/6 Renal Replacement Therapy ; Code status changed to partial Code (DNR) 7/7 Transfuse 1U PRBC 7/9 add solu cortef 7/10 off pressors, weaning stress steroids  7/11 Pressors added back on again 7/12 ID consult due to rising WBC, hypothermia, hypotension concern for  worsening infection. Vancomycin added. Repeat blood cx collected. 7/15 CRRT stopped  7/17 Restarted on low dose levo. No evidence of new infection. Held off on antibiotics. Nephrology contacted family and decision was made to continue iHD this week to allow additional time for renal recovery. Patient is not a long term iHD candidate  7/18 plans for iHD, tolerated with 2L removed  7/19 slight drop in hgb to 6.9 will transfuse 1 unit PRBC  7/20 To [facility], PCCM following for trach / vent needs 7/22>> tolerating HD, will need out of state placement 8/4 last PCCM visit. 8/4 -8/8: No issues. CM workin gon out of state placement  8/15 no acute changes. Continues on PRVC, was apneic with PSV attempt. Did wean to 40% FiO2  8/29 Oral bleeding from biting lip/tongue, subsequent bloody trach secretions  8/30 Left FA AVG occlueded 9/2 s/p thrombectomy and revision of LUE AVG 9/4 LUE with neg venous dupplex 9/5 heparin stopped, resumed Eliquis  9/6 iHD with 2L UF 9/7 changed from [facility] to PCV given high plat pressures 9/10 remains on the ventilator, unable to wean 9/19 unchanged 9/26 Stable on PCV, increased clear secretions per RN  Interim History / Subjective:  No significant events Attempted PSV briefly last week for ~10 minutes, failed 2/2 tachypnea/falling Vt Repeat CXR today Suspect some component of volume overload, given increased frothy secretions Brief bedside PSV trial failed, as patient not triggering vent  Objective:  Blood pressure 114/66, pulse 83, temperature 98.1 F (36.7 C), temperature source Oral, resp. rate 20, height '5\' 6"'$  (1.676 m), weight 78.8 kg, SpO2 97 %.    Vent Mode: PCV FiO2 (%):  [40 %] 40 % Set Rate:  [  20 bmp] 20 bmp PEEP:  [5 cmH20] 5 cmH20 Plateau Pressure:  [20 cmH20-29 cmH20] 20 cmH20   Intake/Output Summary (Last 24 hours) at 10/08/2020 0853 Last data filed at 10/08/2020 0600 Gross per 24 hour  Intake 1030 ml  Output 2800 ml  Net -1770 ml    Filed Weights    10/07/20 1252 10/07/20 1620 10/08/20 0500  Weight: 77.9 kg 76.5 kg 78.8 kg   Physical Examination: General: Chronically ill-appearing elderly man in NAD. HEENT: [place]/AT, anicteric sclera, PERRL, moist mucous membranes. Tracheostomy in place with copious clear frothy peritracheal secretions. Neuro:  Awake, appears oriented.  Responds to verbal stimuli. Following commands consistently. No spontaneous movement of extremities noted (functional quadriplegia). CV: RRR, no m/g/r. PULM: Breathing even and unlabored on vent (PCV, 26/5, FiO2 40%). Lung fields with lung sounds c/w mechanical ventilation, faint crackles, diminished at bilateral bases. GI: Soft, nontender, nondistended. Normoactive bowel sounds. LLQ colostomy in place with thin red-brown liquid output, stoma pink and budding. Extremities: Trace BLE edema noted. Skin: Warm/dry, no rashes.  Resolved Hospital Problem List:  Hyperkalemia Tachycardia Hypotension Thrombocytosis  AKI Septic shock 2nd to HCAP and UTI all present on admission Acinetobacter in sputum culture from 07/12/20. Completed 14 days of Unasyn for -Of pressors on 4/14. Restarted  levo 7/17. Acute on chronic respiratory failure   Assessment & Plan:   C3-C5 compressive Myelopathy w/ resultant Chronic Respiratory failure, Vent and Trach dependence  Acinetobacter Baumannii PNA Antibiotic course completed 8/22. Remains unable to wean from vent; multiple failed SBT. Suspect component of volume overload at this point, given increased copious frothy peritracheal secretions. - Continue vent support (8-10 cc/kg IBW) - Continue PCV, tolerating with better Pplat - Wean FiO2 for O2 sat > 90% - Continue glycopyrrolate, Mucinex; consider scopolamine addition if secretions remain increased - Trach care per protocol - VAP bundle - Pulmonary hygiene - PAD protocol for sedation: Oxycodone PRN for goal RASS 0 to -1, minimize as able - F/u CXR 9/26 - Volume removal as tolerated  with HD - Remainder per primary team - Placement pending AVG maturation  Remains partial code (drugs, vent only) in the event of decompensation  PCCM will continue to follow weekly. Please do not hesitate to call sooner if questions/problems arise.  Lestine Mount, PA-C [facility] 10/08/20 8:54 AM  Please see [email or web address] for pager details.  From 7A-7P if no response, please call [phone] After hours, please call ELink [phone]

## 2020-10-08 NOTE — Progress Notes (Signed)
Assisted family with tele-visit via elink 

## 2020-10-08 NOTE — Consult Note (Addendum)
[facility] Nurse Consult Note: WOC consult was previously performed 8/24 for chronic Stage 4 sacrum wound.  Surgical team performed a consult of the location on 9/15 and ordered moist gauze packing.    Requested to reassess wound and topical treatment orders.  Reason for Consult: Sacrum with chronic Stage 4 pressure injury; 6X5X5cm, undermining to wound edges 4 cm, tunneling to 6 cm towards 6:00 o'clock.  Wound is red and moist, bone palpable, large amt tan drainage with strong foul odor.   Pressure Injury POA: Yes Dressing procedure/placement/frequency: Pt has multiple systemic factors which can impair healing. They are on a low airloss mattress to reduce pressure.  Topical treatment orders provided for bedside nurses to perform as follows to assist with cleaning up the drainage and odor: Bedside nurse; pack sacrum wound with Dakin's moistened gauze Q day, using swab to fill, then cover with foam dressing.  Change foam dressing Q 3 days or PRN soiling.  After 5 days, resume moist gauze packing. Please re-consult if further assistance is needed.  Thank-you,  Julien Girt MSN, [place], [place], [place], [place]

## 2020-10-08 NOTE — Progress Notes (Addendum)
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  Presented from [place] with AMS  hypotension and pneumonia resultant septic shock is admitted to the intensive care unit.  Has had multiple admissions in the past for Klebsiella, MRSA and Pseudomonas urinary tract infections and pneumonia.  His most recent admission 10/03/2020 with multiple drug-resistant Klebsiella and right ureteral calculus requiring double-J stent.  Renal replacement therapy was initiated 07/18/2020.     Underwent successful dialysis 10/05/2020 with 2 L removed.  Assessment/ Plan:   1.Acute kidney Injury-prolonged dialysis dependency, now progressed to ESRD:  ATN from septic shock-without any evidence of renal recovery to date . initiated on CRRT 07/18/20 and transitioned to IHD.  Has TDC & AVG, he received dialysis after midnight 10/05/2020, 9/25.  On for today as well.   2 Vascular access - had left forearm AVG placed 09/02/2020 but unfortunately had clotted.  s/p thrombectomy and revision 9/2, graft can be used 4 weeks thereafter.    - Fortunately good bruit on auscultation this am; will challenge with new cannulation ~9/28. Initial FAL AVG placed by Dr. Stanford Breed on 08/20/2020 and 9/28 is already >4weeks which should be enough time for incorporation of the AVG.   3 Septic shock/ventilator associated pneumonia due to recurrent Acinetobacter infection: Off antibiotics and stable hemodynamics.   4 Leukocytosis -persistent, will defer to primary service for further mgmt   5 Chronic respiratory failure: Status post tracheostomy with ventilator management per CCM.   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   7 Chronic kidney disease/metabolic bone disease: Calcium and phosphorus level under control with hemodialysis, off binders   8 Anemia: contiue ESA, monitor  Hb.  Transfuse as needed.   9 Acute metabolic encephalopathy: Continue management as above.,  Supportive care.   10 Disposition - pt cannot return to [facility] now that he is ESRD. He is going to [facility] in [place], [place]. He needs to have [place] Medicaid & his AVG needs to be mature prior to being accepted. Other possible barrier is pt is a permanent trach & vent pt.    Subjective:   No acute events overnight; was able to UF net 2.8L on 9/25.   Objective:   BP 114/66   Pulse 83   Temp 98.1 F (36.7 C) (Oral)   Resp 20   Ht 5' 6"  (1.676 m)   Wt 78.8 kg   SpO2 97%   BMI 28.04 kg/m   Intake/Output Summary (Last 24 hours) at 10/08/2020 0851 Last data filed at 10/08/2020 0600 Gross per 24 hour  Intake 1030 ml  Output 2800 ml  Net -1770 ml   Weight change: 0 kg  Physical Exam: General:NAD, tracheostomy on vent. Heart:RRR, s1s2 nl Lungs: trach, no increased work of breathing. Abdomen:soft, Non-tender, PEG tube and ostomy bag Extremities:Trace lower extremities edema Neuro: awake Dialysis Access: RIJ TDC, Lt FAL  AVG decent bruit  w/ auscultation, swelling of the left upper extremity noted (stable)  Imaging: No results found.  Labs: BMET Recent Labs  Lab 10/02/20 1256 10/03/20 0341 10/04/20 0341 10/05/20 0006 10/06/20 0429 10/07/20 0731 10/08/20 0606  NA 129* 132* 130* 130* 131* 131* 134*  K 5.5* 3.5 3.7 4.1 3.1* 3.9 3.3*  CL 92* 94* 93* 93* 93* 92* 94*  CO2 21* 25 24 23 25 23 25   GLUCOSE 185* 200* 141* 121* 136* 187* 148*  BUN 93* 44* 74*  96* 64* 97* 65*  CREATININE 1.39* 0.89 1.25* 1.48* 1.17 1.64* 1.22  CALCIUM 9.2 8.9 9.1 9.2 9.2 9.3 9.3  PHOS 7.3*  --   --  6.3*  --   --   --    CBC Recent Labs  Lab 10/04/20 0341 10/05/20 0006 10/05/20 2000 10/06/20 0429 10/07/20 1301 10/08/20 0606  WBC 21.2* 18.4*  --  20.9* 17.3* 19.1*  NEUTROABS 16.7*  --   --  15.6*  --   --   HGB 7.7* 7.1* 8.6* 9.1* 8.0* 8.5*  HCT 24.0* 22.4* 26.4* 27.8* 25.1* 25.9*  MCV 76.9*  77.0*  --  77.7* 77.5* 75.3*  PLT 413* 434*  --  449* 462* 464*    Medications:     amiodarone  200 mg Per Tube Daily   apixaban  5 mg Per Tube BID   buPROPion  75 mg Per Tube BID   chlorhexidine gluconate (MEDLINE KIT)  15 mL Mouth Rinse BID   Chlorhexidine Gluconate Cloth  6 each Topical Q0600   darbepoetin (ARANESP) injection - DIALYSIS  100 mcg Intravenous Q Tue-HD   feeding supplement (PROSource TF)  45 mL Per Tube TID   guaiFENesin  5 mL Per Tube BID   insulin aspart  0-9 Units Subcutaneous Q4H   insulin glargine-yfgn  30 Units Subcutaneous Daily   lidocaine  1 patch Transdermal Q24H   mouth rinse  15 mL Mouth Rinse 10 times per day   metaxalone  800 mg Per Tube TID   midodrine  2.5 mg Per Tube TID WC   multivitamin  1 tablet Per Tube QHS   oxyCODONE  5 mg Per Tube Q4H   pantoprazole sodium  40 mg Per Tube Daily   sertraline  100 mg Per Tube Daily   simethicone  40 mg Per Tube QID   sodium chloride flush  10-40 mL Intracatheter Q12H   sodium hypochlorite   Topical 1 day or 1 dose      [name], MD 10/08/2020, 8:51 AM

## 2020-10-09 ENCOUNTER — Other Ambulatory Visit: Payer: Self-pay

## 2020-10-09 DIAGNOSIS — J9601 Acute respiratory failure with hypoxia: Secondary | ICD-10-CM | POA: Diagnosis not present

## 2020-10-09 DIAGNOSIS — N186 End stage renal disease: Secondary | ICD-10-CM

## 2020-10-09 DIAGNOSIS — Z992 Dependence on renal dialysis: Secondary | ICD-10-CM

## 2020-10-09 LAB — RENAL FUNCTION PANEL
Albumin: 1.7 g/dL — ABNORMAL LOW (ref 3.5–5.0)
Anion gap: 16 — ABNORMAL HIGH (ref 5–15)
BUN: 102 mg/dL — ABNORMAL HIGH (ref 8–23)
CO2: 23 mmol/L (ref 22–32)
Calcium: 9.4 mg/dL (ref 8.9–10.3)
Chloride: 92 mmol/L — ABNORMAL LOW (ref 98–111)
Creatinine, Ser: 1.55 mg/dL — ABNORMAL HIGH (ref 0.61–1.24)
GFR, Estimated: 48 mL/min — ABNORMAL LOW (ref >60)
Glucose, Bld: 142 mg/dL — ABNORMAL HIGH (ref 70–99)
Phosphorus: 5.5 mg/dL — ABNORMAL HIGH (ref 2.5–4.6)
Potassium: 4.1 mmol/L (ref 3.5–5.1)
Sodium: 131 mmol/L — ABNORMAL LOW (ref 135–145)

## 2020-10-09 LAB — GLUCOSE, CAPILLARY
Glucose-Capillary: 109 mg/dL — ABNORMAL HIGH (ref 70–99)
Glucose-Capillary: 131 mg/dL — ABNORMAL HIGH (ref 70–99)
Glucose-Capillary: 136 mg/dL — ABNORMAL HIGH (ref 70–99)
Glucose-Capillary: 123 mg/dL — ABNORMAL HIGH (ref 70–99)
Glucose-Capillary: 148 mg/dL — ABNORMAL HIGH (ref 70–99)

## 2020-10-09 MED ORDER — SODIUM CHLORIDE 0.9 % IV SOLN: 100.0000 mL | INTRAVENOUS | Status: DC | PRN

## 2020-10-09 MED ORDER — PENTAFLUOROPROP-TETRAFLUOROETH EX AERO: 1.0000 "application " | INHALATION_SPRAY | CUTANEOUS | Status: DC | PRN

## 2020-10-09 MED ORDER — LIDOCAINE HCL (PF) 1 % IJ SOLN: 5.0000 mL | INTRAMUSCULAR | Status: DC | PRN

## 2020-10-09 MED ORDER — LIDOCAINE-PRILOCAINE 2.5-2.5 % EX CREA
1.0000 "application " | TOPICAL_CREAM | CUTANEOUS | Status: DC | PRN
  Filled 2020-10-09: qty 5

## 2020-10-09 MED ORDER — ALTEPLASE 2 MG IJ SOLR
2.0000 mg | Freq: Once | INTRAMUSCULAR | Status: DC | PRN
  Filled 2020-10-09: qty 2

## 2020-10-09 MED ORDER — HEPARIN SODIUM (PORCINE) 1000 UNIT/ML DIALYSIS: 1000.0000 [IU] | INTRAMUSCULAR | Status: DC | PRN

## 2020-10-09 NOTE — Progress Notes (Signed)
[facility] PROGRESS NOTE  Luis Orr HEN:[id number] DOB: 1950/05/26 DOA: 07/07/2020 PCP: Townsend Roger, MD  Status: Remains inpatient appropriate because:Hemodynamically unstable, Persistent severe electrolyte disturbances, Unsafe d/c plan, and Inpatient level of care appropriate due to severity of illness  Dispo: The patient is from: SNF[facility] vent capable              Anticipated d/c is to: SNF Vent capable w/ access to in facility/stretcher based HD-a facility in [place] has been located in preauthorization in progress.              Patient currently is not medically stable to d/c.   Difficult to place patient Yes    Level of care: ICU  Code Status: Partial (no CPR and no defibrillation or cardioversion) Family Communication: Luis Orr at bedside on 9/21 DVT prophylaxis: IV Heparin COVID vaccination status: Unknown    HPI:  70 y.o. 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 Patient presented secondary to altered mental status and hypotension with recurrent pneumonia requiring vasopressor support and ICU admission.he has had multiple recent admissions for sepsis secondary to various infections including UTIs, pneumonias and bacteremia in setting of MDR Klebsiella, MRSA and pseudomonas. Most recent admission September 2021 in setting of sepsis due to MDR Klebsiella with right ureteral calculus s/p double J ureteral stent placement and treated with meropenem x 10days. He was discharged back to [facility].  ICU significant events: 6/29 admitted to ICU for septic shock on levo to maintain MAP>65, continued on vent support 6/29 Blood Cx>> staph species in 1 of 4 cultures drawn>> suspect contaminant 6/30 Off pressors 7/2 ID Consult for MDR acinetobacter in trach asp, Meropenem changed to unasyn 7/2 PRBC transfusion, iron replacement 7/5 Pressors added again. Central line placed. Drainage from gastrostomy  tube >> CT Abd without fluid collection around gastrostomy tube but with diffuse body wall edema. Echo EF 50-55%, mild LVH 7/6 Renal Replacement Therapy ; Code status changed to partial Code (DNR) 7/7 Transfuse 1U PRBC 7/9 add solu cortef 7/10 off pressors, weaning stress steroids 7/11 Pressors added back on again 7/12 ID consult due to rising WBC, hypothermia, hypotension concern for worsening infection. Vancomycin added. Repeat blood cx collected. 7/15 CRRT stopped 7/17 Restarted on low dose levo. No evidence of new infection. Held off on antibiotics. Nephrology contacted family and decision was made to continue iHD this week to allow additional time for renal recovery. Patient is not the best long term iHD candidate but family wants aggressive care 7/18 plans for iHD, tolerated with 2L removed 719 slight drop in hgb to 6.9 will transfuse 1 unit PRBC 7/22 > 7/26: Tolerating HD. TOC consulted for placement. Will likely need out of state placement. 8/15 WBCs greater than 28,000, hypoglycemia, procalcitonin >18, chest x-ray consistent with pneumonia, recent blood cultures consistent with contamination.  Unasyn IV resumed after sputum culture obtained 8/16 consultation placed with ethics committee regarding evaluation for futility of care 8/16 Sputum cx + for Acinetobacter with sensitivities pending 8/17 extensive conversation held with patient's Luis regarding patient's poor prognosis and reemergence of Acinetobacter VAP.  Also discussed with her that patient has been able to convey to multiple staff that he does not want to continue on the ventilator or with dialysis.  Luis reports that the patient does not understand what he is saying, she is the POA and that she wants to continue aggressive care. 8/18 patient now telling nephrology, myself and the rest  of the staff he replies yes to wanting to remain on dialysis and on the ventilator and this direct question is asked. 8/18 Acinetobacter sensitive  to Unasyn only.  Discussed with ID/Dr. Juleen China.  Plan is to administer antibiotics for 7-day since patient has not had any increased O2 needs and remains afebrile.  If any concerns over worsening symptoms can give an additional 3 days. Developed moderate bleeding around tracheostomy site on 8/19.  Likely related to combination of Eliquis and heparin required for CRRT.  We will hold Eliquis for now and monitor degree of bleeding Eliquis resumed on 8/22.  Overnight into 8/23 patient bit his tongue and had bleeding.  Also had some bleeding around trach likely from oral bleeding.  Also though did have bleeding from old central line site. 8/23 had transient bleeding around trach and prior central line insertion site overnight.  Patient had bitten tongue and this likely explain the bleeding.  This morning patient was not having any bleeding.  On 8/25 had redeveloped slow ooze bleeding from mouth and trach.  Eliquis discontinued in favor of IV heparin especially given patient will undergo surgical procedure for vascular access on 8/29 8/30 Left FA AVG occluded 9/2 status post thrombectomy and revision of LUE AV graft 9/4 LUE edema with negative venous duplex. 9/5 heparin discontinued and Eliquis resumed    Subjective: Awakens to voice.  Withdrawn and limited interaction.  Objective: Vitals:   10/09/20 0500 10/09/20 0600  BP: 106/66 110/66  Pulse: 82 82  Resp: 20 20  Temp:    SpO2: 100% 100%    Intake/Output Summary (Last 24 hours) at 10/09/2020 0742 Last data filed at 10/09/2020 0500 Gross per 24 hour  Intake 920 ml  Output 325 ml  Net 595 ml   Filed Weights   10/07/20 1620 10/08/20 0500 10/09/20 0500  Weight: 76.5 kg 78.8 kg 79.3 kg    Exam:  Constitutional: Awake, flat affect Respiratory: Trach: 6.0 XLT cuffed, Vent settings: PRVC mode, Fio2 40%, rate 20, VT 510, PEEP 5; bilateral lung sounds clear to auscultation anteriorly; foamy secretions around trach insertion  site Cardiovascular: Maintaining sinus rhythm without ectopy, no JVD or peripheral edema normotensive -AVG palpable thrill. Abdomen: LBM 9/27, PEG tube,  Left lower quadrant colostomy with small amount of stool, abdomen nontender with normoactive bowel sounds.  Neurologic: Chronic quadriplegia. No sensation below shoulders. Psychiatric: Awake, and flat affect.  Limited to no interaction   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 - ventilator management per PCCM Failed recent SBT secondary to apnea  Recurrent ventilator associated pneumonia 2/2 recurrent Acinetobacter/sepsis without shock has been treated x2 this admission for Acinetobacter tracheobronchitis/pneumonia    AKI with oliguria/volume overload/ New CKD requiring HD Anuric-Nephrology following, initially required CRRT and has transitioned to HD (LUE AVG functional-revised 9/2 but needs to mature x 4 weeks before can dc   Septic shock 2/2 VAP POA Presented with sepsis physiology and profound shock -resolved  Chronic thoracic back pain Continue hedger low-dose Oxy IR along with IV morphine prn breakthrough pain; continue Skelaxin and lidocaine patch  History of paroxysmal atrial fibrillation Continue amiodarone and Eliquis maintaining SR QTc 420 ms on 6/81   Acute metabolic encephalopathy Resolved-Secondary to acute illness. CT head negative for acute process.   Diabetes mellitus, type 2 Continue Semglee 30 units daily.  Continue SSI w/ CBG checks every 4 hours  Acute diverticulitis Has completed a course of Flagyl Continues to  have residual pain WBC trended down currently 15.3 from a peak of 25.4 but has started climbing again with last 2 readings 25.6 and 21.2.  This is consistent with his chronic leukocytosis throughout the hospitalization.  Nodular irregular thickening of the ascending colon-?  Neoplasm [facility] consulted.   They discussed with his Luis.  Given patient's debilitated state/diverticulitis they recommend holding off on colonoscopy for now.  They have signed off and recommend calling for any questions.   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  Plan is to discharge to facility in [place]   Microcytic anemia/Acute anemia superimposed on anemia of chronic kidney disease S/P 5 units of PRBC -Continue Aranesp and follow Hgb prn 9/23 hemoglobin slowly trending down and as of today is 7.1.  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  Right/posterior/lateral ankle unknown if present on admission.  DTI left ear, not present on admission. (For additional documentation see wound care notes) Patient recently underwent bedside debridement of sacral and heel wound.  No evidence of infection found.  Wounds now with red granular base.  Continue wound packing as prior.    Data Reviewed: Basic Metabolic Panel: Recent Labs  Lab 10/02/20 1256 10/03/20 0341 10/04/20 0341 10/05/20 0006 10/06/20 0429 10/07/20 0731 10/08/20 0606 10/09/20 0654  NA 129*   < > 130* 130* 131* 131* 134* 131*  K 5.5*   < > 3.7 4.1 3.1* 3.9 3.3* 4.1  CL 92*   < > 93* 93* 93* 92* 94* 92*  CO2 21*   < > $R'24 23 25 23 25 23  'bI$ GLUCOSE 185*   < > 141* 121* 136* 187* 148* 142*  BUN 93*   < > 74* 96* 64* 97* 65* 102*  CREATININE 1.39*   < > 1.25* 1.48* 1.17 1.64* 1.22 1.55*  CALCIUM 9.2    < > 9.1 9.2 9.2 9.3 9.3 9.4  MG  --   --  2.2  --   --   --   --   --   PHOS 7.3*  --   --  6.3*  --   --   --  5.5*   < > = values in this interval not displayed.   Liver Function Tests: Recent Labs  Lab 10/02/20 1256 10/04/20 0341 10/05/20 0006 10/06/20 0429 10/09/20 0654  AST  --  24  --  27  --   ALT  --  16  --  14  --   ALKPHOS  --  169*  --  170*  --   BILITOT  --  0.8  --  1.0  --   PROT  --  7.0  --  7.5  --   ALBUMIN 1.7* 1.7* 1.6* 1.8* 1.7*    CBC: Recent Labs  Lab 10/04/20 0341 10/05/20 0006 10/05/20 2000 10/06/20 0429 10/07/20 1301 10/08/20 0606  WBC 21.2* 18.4*  --  20.9* 17.3* 19.1*  NEUTROABS 16.7*  --   --  15.6*  --   --   HGB 7.7* 7.1* 8.6*  9.1* 8.0* 8.5*  HCT 24.0* 22.4* 26.4* 27.8* 25.1* 25.9*  MCV 76.9* 77.0*  --  77.7* 77.5* 75.3*  PLT 413* 434*  --  449* 462* 464*   CBG: Recent Labs  Lab 10/08/20 1131 10/08/20 1532 10/08/20 1949 10/08/20 2351 10/09/20 0325  GLUCAP 151* 125* 135* 137* 109*      Scheduled Meds:  amiodarone  200 mg Per Tube Daily   apixaban  5 mg Per Tube BID   buPROPion  75 mg Per Tube BID   chlorhexidine gluconate (MEDLINE KIT)  15 mL Mouth Rinse BID   Chlorhexidine Gluconate Cloth  6 each Topical Q0600   darbepoetin (ARANESP) injection - DIALYSIS  100 mcg Intravenous Q Tue-HD   feeding supplement (PROSource TF)  45 mL Per Tube TID   guaiFENesin  5 mL Per Tube BID   insulin aspart  0-9 Units Subcutaneous Q4H   insulin glargine-yfgn  30 Units Subcutaneous Daily   lidocaine  1 patch Transdermal Q24H   mouth rinse  15 mL Mouth Rinse 10 times per day   metaxalone  800 mg Per Tube TID   midodrine  2.5 mg Per Tube TID WC   multivitamin  1 tablet Per Tube QHS   oxyCODONE  5 mg Per Tube Q4H   pantoprazole sodium  40 mg Per Tube Daily   sertraline  100 mg Per Tube Daily   simethicone  40 mg Per Tube QID   sodium chloride flush  10-40 mL Intracatheter Q12H   sodium hypochlorite   Topical 1 day or 1 dose    Continuous Infusions:  sodium chloride 10 mL/hr at 09/24/2020 1429   sodium chloride 500 mL (09/01/20 1408)   sodium chloride     sodium chloride     feeding supplement (NEPRO CARB STEADY) 1,000 mL (10/08/20 1320)    Principal Problem:   Acute respiratory failure with hypoxia (HCC) Active Problems:   Ventilator dependent (HCC)   Septic shock (HCC)   Pressure injury of skin   AKI (acute kidney injury) ([place])   Hypotension   Goals of care, counseling/discussion   Quadriplegia (HCC)   Chronic kidney disease requiring chronic dialysis ([place])   Hyperglycemia   Sepsis due to Acinetobacter baumannii ([place])   Pneumonia due to Acinetobacter species ([place])   Dysautonomia (HCC)   Chronic midline thoracic back pain   Abdominal distention   Leukocytosis   Consultants: PCCM Nephrology Infectious disease Palliative care medicine  Procedures: Echocardiogram Insertion of double-lumen HD catheter  Antibiotics: Cefepime x1 dose Meropenem 6/29 through 7/2 Vancomycin 6/29 through 6/30 Unasyn 7/2 through 7/15 Unasyn 8/15 >8/22   Time spent: 25 minutes    Erin Hearing ANP  [facility] 7 am - 330 pm/M-F for direct patient care and secure chat Please refer to Amion for contact info 90  days

## 2020-10-09 NOTE — Progress Notes (Signed)
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  Presented from [place] with AMS  hypotension and pneumonia resultant septic shock is admitted to the intensive care unit.  Has had multiple admissions in the past for Klebsiella, MRSA and Pseudomonas urinary tract infections and pneumonia.  His most recent admission 10/03/2020 with multiple drug-resistant Klebsiella and right ureteral calculus requiring double-J stent.  Renal replacement therapy was initiated 07/18/2020.     Underwent successful dialysis 10/05/2020 with 2 L removed.  Assessment/ Plan:   1.Acute kidney Injury-prolonged dialysis dependency, now progressed to ESRD:  ATN from septic shock-without any evidence of renal recovery to date . initiated on CRRT 07/18/20 and transitioned to IHD.  Has TDC & AVG, he received dialysis after midnight 10/05/2020, 9/25.  On for 9/27 (unable to HD yest bec of logistics); markedly overloaded. Likely will need Wed as well.   2 Vascular access - had left forearm AVG placed 08/30/2020 but unfortunately had clotted.  s/p thrombectomy and revision 9/2, graft can be used 4 weeks thereafter.  Swelling of left upper extremity noted continue arm elevation and supportive care.    - Will challenge with new cannulation ~9/30. Initial FAL AVG placed by Dr. Stanford Breed on 08/16/2020.   3 Septic shock/ventilator associated pneumonia due to recurrent Acinetobacter infection: Off antibiotics and stable hemodynamics.   4 Leukocytosis -persistent, will defer to primary service for further mgmt   5 Chronic respiratory failure: Status post tracheostomy with ventilator management per CCM.   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   7 Chronic kidney disease/metabolic bone disease: Calcium and phosphorus level under control with hemodialysis, off binders   8  Anemia: contiue ESA, monitor Hb.  Transfuse as needed.   9 Acute metabolic encephalopathy: Continue management as above.,  Supportive care.   10 Disposition - pt cannot return to [facility] now that he is ESRD. He is going to [facility] in [place], [place]. He needs to have [place] Medicaid & his AVG needs to be mature prior to being accepted. Other possible barrier is pt is a permanent trach & vent pt.    Subjective:   No acute events overnight; was able to UF net 2.8L on 9/25.   Objective:   BP 120/76   Pulse 82   Temp 98.2 F (36.8 C) (Oral)   Resp 20   Ht 5' 6"  (1.676 m)   Wt 79.3 kg   SpO2 99%   BMI 28.22 kg/m   Intake/Output Summary (Last 24 hours) at 10/09/2020 0817 Last data filed at 10/09/2020 0500 Gross per 24 hour  Intake 875 ml  Output 325 ml  Net 550 ml   Weight change: 1.4 kg  Physical Exam: General:NAD, tracheostomy on vent. Heart:RRR, s1s2 nl Lungs: trach, no increased work of breathing. Abdomen:soft, Non-tender, PEG tube and ostomy bag Extremities:Trace lower extremities edema Neuro: awake Dialysis Access: LIJ TDC, LUE AVG decent bruit  w/ auscultation, swelling of the left upper extremity noted (stable)  Imaging: DG CHEST PORT 1 VIEW  Result Date: 10/08/2020 CLINICAL DATA:  Chronic respiratory failure. EXAM: PORTABLE CHEST 1 VIEW COMPARISON:  09/24/2020 FINDINGS: Tracheostomy tube tip is above the carina. Dual lumen right IJ catheter is identified with tips at the cavoatrial junction. Left IJ catheter tip is also at the cavoatrial junction. Heart size and mediastinal contours are stable. Small bilateral pleural effusions. Persistent bilateral lower lobe airspace opacities with  air bronchograms. Upper lung zones appear clear. IMPRESSION: 1. Stable support apparatus. 2. Persistent bilateral lower lobe airspace opacities with air bronchograms and small bilateral pleural effusions. Electronically Signed   By: Kerby Moors M.D.   On: 10/08/2020 13:22     Labs: BMET Recent Labs  Lab 10/02/20 1256 10/03/20 0341 10/04/20 0341 10/05/20 0006 10/06/20 0429 10/07/20 0731 10/08/20 0606 10/09/20 0654  NA 129* 132* 130* 130* 131* 131* 134* 131*  K 5.5* 3.5 3.7 4.1 3.1* 3.9 3.3* 4.1  CL 92* 94* 93* 93* 93* 92* 94* 92*  CO2 21* 25 24 23 25 23 25 23   GLUCOSE 185* 200* 141* 121* 136* 187* 148* 142*  BUN 93* 44* 74* 96* 64* 97* 65* 102*  CREATININE 1.39* 0.89 1.25* 1.48* 1.17 1.64* 1.22 1.55*  CALCIUM 9.2 8.9 9.1 9.2 9.2 9.3 9.3 9.4  PHOS 7.3*  --   --  6.3*  --   --   --  5.5*   CBC Recent Labs  Lab 10/04/20 0341 10/05/20 0006 10/05/20 2000 10/06/20 0429 10/07/20 1301 10/08/20 0606  WBC 21.2* 18.4*  --  20.9* 17.3* 19.1*  NEUTROABS 16.7*  --   --  15.6*  --   --   HGB 7.7* 7.1* 8.6* 9.1* 8.0* 8.5*  HCT 24.0* 22.4* 26.4* 27.8* 25.1* 25.9*  MCV 76.9* 77.0*  --  77.7* 77.5* 75.3*  PLT 413* 434*  --  449* 462* 464*    Medications:     amiodarone  200 mg Per Tube Daily   apixaban  5 mg Per Tube BID   buPROPion  75 mg Per Tube BID   chlorhexidine gluconate (MEDLINE KIT)  15 mL Mouth Rinse BID   Chlorhexidine Gluconate Cloth  6 each Topical Q0600   darbepoetin (ARANESP) injection - DIALYSIS  100 mcg Intravenous Q Tue-HD   feeding supplement (PROSource TF)  45 mL Per Tube TID   guaiFENesin  5 mL Per Tube BID   insulin aspart  0-9 Units Subcutaneous Q4H   insulin glargine-yfgn  30 Units Subcutaneous Daily   lidocaine  1 patch Transdermal Q24H   mouth rinse  15 mL Mouth Rinse 10 times per day   metaxalone  800 mg Per Tube TID   midodrine  2.5 mg Per Tube TID WC   multivitamin  1 tablet Per Tube QHS   oxyCODONE  5 mg Per Tube Q4H   pantoprazole sodium  40 mg Per Tube Daily   sertraline  100 mg Per Tube Daily   simethicone  40 mg Per Tube QID   sodium chloride flush  10-40 mL Intracatheter Q12H   sodium hypochlorite   Topical 1 day or 1 dose      [name], MD 10/09/2020, 8:17 AM

## 2020-10-10 DIAGNOSIS — J9601 Acute respiratory failure with hypoxia: Secondary | ICD-10-CM | POA: Diagnosis not present

## 2020-10-10 LAB — GLUCOSE, CAPILLARY
Glucose-Capillary: 142 mg/dL — ABNORMAL HIGH (ref 70–99)
Glucose-Capillary: 149 mg/dL — ABNORMAL HIGH (ref 70–99)
Glucose-Capillary: 153 mg/dL — ABNORMAL HIGH (ref 70–99)
Glucose-Capillary: 153 mg/dL — ABNORMAL HIGH (ref 70–99)
Glucose-Capillary: 163 mg/dL — ABNORMAL HIGH (ref 70–99)
Glucose-Capillary: 166 mg/dL — ABNORMAL HIGH (ref 70–99)
Glucose-Capillary: 174 mg/dL — ABNORMAL HIGH (ref 70–99)

## 2020-10-10 LAB — BASIC METABOLIC PANEL
Anion gap: 13 (ref 5–15)
BUN: 59 mg/dL — ABNORMAL HIGH (ref 8–23)
CO2: 24 mmol/L (ref 22–32)
Calcium: 9.3 mg/dL (ref 8.9–10.3)
Chloride: 95 mmol/L — ABNORMAL LOW (ref 98–111)
Creatinine, Ser: 1.07 mg/dL (ref 0.61–1.24)
GFR, Estimated: >60 mL/min (ref >60)
Glucose, Bld: 156 mg/dL — ABNORMAL HIGH (ref 70–99)
Potassium: 3.6 mmol/L (ref 3.5–5.1)
Sodium: 132 mmol/L — ABNORMAL LOW (ref 135–145)

## 2020-10-10 LAB — CBC
HCT: 29.1 % — ABNORMAL LOW (ref 39.0–52.0)
Hemoglobin: 8.9 g/dL — ABNORMAL LOW (ref 13.0–17.0)
MCH: 24 pg — ABNORMAL LOW (ref 26.0–34.0)
MCHC: 30.6 g/dL (ref 30.0–36.0)
MCV: 78.4 fL — ABNORMAL LOW (ref 80.0–100.0)
Platelets: 512 10*3/uL — ABNORMAL HIGH (ref 150–400)
RBC: 3.71 MIL/uL — ABNORMAL LOW (ref 4.22–5.81)
RDW: 20.2 % — ABNORMAL HIGH (ref 11.5–15.5)
WBC: 21.1 10*3/uL — ABNORMAL HIGH (ref 4.0–10.5)
nRBC: 1 % — ABNORMAL HIGH (ref 0.0–0.2)

## 2020-10-10 LAB — PROCALCITONIN: Procalcitonin: 9.98 ng/mL

## 2020-10-10 NOTE — Progress Notes (Signed)
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  Presented from [place] with AMS  hypotension and pneumonia resultant septic shock is admitted to the intensive care unit.  Has had multiple admissions in the past for Klebsiella, MRSA and Pseudomonas urinary tract infections and pneumonia.  His most recent admission 10/03/2020 with multiple drug-resistant Klebsiella and right ureteral calculus requiring double-J stent.  Renal replacement therapy was initiated 07/18/2020.     Underwent successful dialysis 10/05/2020 with 2 L removed.  Assessment/ Plan:   1.Acute kidney Injury-prolonged dialysis dependency, now progressed to ESRD:  ATN from septic shock-without any evidence of renal recovery to date . initiated on CRRT 07/18/20 and transitioned to IHD.  Has TDC & AVG, he received dialysis after midnight 10/05/2020, 9/25, 9/27 (net UF 2L).  On for 9/27 (unable to HD yest bec of logistics); markedly overloaded.  On for today as well and then next planned will be Fri + Sat for UF. We are going to attempt cannulation of the lt FAL AVG to facilitate transfer to the [place].    2 Vascular access - had left forearm AVG placed 08/14/2020 but unfortunately had clotted.  s/p thrombectomy and revision 9/2, graft can be used 4 weeks thereafter.  Swelling of left upper extremity noted continue arm elevation and supportive care.    - Will challenge with new cannulation ~9/30. Initial FAL AVG placed by Dr. Stanford Breed on 09/06/2020.   3 Septic shock/ventilator associated pneumonia due to recurrent Acinetobacter infection: Off antibiotics and stable hemodynamics.   4 Leukocytosis -persistent, will defer to primary service for further mgmt   5 Chronic respiratory failure: Status post tracheostomy with ventilator management per CCM.   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   7 Chronic kidney disease/metabolic bone disease: Calcium and phosphorus level under control with hemodialysis, off binders   8 Anemia: contiue ESA, monitor Hb.  Transfuse as needed.   9 Acute metabolic encephalopathy: Continue management as above.,  Supportive care.   10 Disposition - pt cannot return to [facility] now that he is ESRD. He is going to [facility] in [place], [place]. He needs to have [place] Medicaid & his AVG needs to be mature prior to being accepted. Other possible barrier is pt is a permanent trach & vent pt.    Subjective:   No acute events overnight.   Objective:   BP 137/81   Pulse 95   Temp 98.7 F (37.1 C) (Oral)   Resp 20   Ht 5' 6"  (1.676 m)   Wt 78.2 kg   SpO2 100%   BMI 27.83 kg/m   Intake/Output Summary (Last 24 hours) at 10/10/2020 0932 Last data filed at 10/10/2020 0354 Gross per 24 hour  Intake 675 ml  Output 2100 ml  Net -1425 ml   Weight change: 3.1 kg  Physical Exam: General:NAD, tracheostomy on vent. Heart:RRR, s1s2 nl Lungs: trach, no increased work of breathing. Abdomen:soft, Non-tender, PEG tube and ostomy bag Extremities:Trace lower extremities edema Neuro: awake Dialysis Access: RIJ TDC, Lt FAL AVG decent bruit  w/ auscultation  Imaging: DG CHEST PORT 1 VIEW  Result Date: 10/08/2020 CLINICAL DATA:  Chronic respiratory failure. EXAM: PORTABLE CHEST 1 VIEW COMPARISON:  09/24/2020 FINDINGS: Tracheostomy tube tip is above the carina. Dual lumen right IJ catheter is identified with tips at the cavoatrial junction. Left IJ catheter tip is also at the cavoatrial junction.  Heart size and mediastinal contours are stable. Small bilateral pleural effusions. Persistent bilateral lower lobe airspace opacities with air bronchograms. Upper lung zones appear clear. IMPRESSION: 1. Stable support apparatus. 2. Persistent bilateral lower lobe airspace opacities with air bronchograms and small bilateral pleural effusions. Electronically  Signed   By: Kerby Moors M.D.   On: 10/08/2020 13:22    Labs: BMET Recent Labs  Lab 10/04/20 0341 10/05/20 0006 10/06/20 0429 10/07/20 0731 10/08/20 0606 10/09/20 0654 10/10/20 0357  NA 130* 130* 131* 131* 134* 131* 132*  K 3.7 4.1 3.1* 3.9 3.3* 4.1 3.6  CL 93* 93* 93* 92* 94* 92* 95*  CO2 24 23 25 23 25 23 24   GLUCOSE 141* 121* 136* 187* 148* 142* 156*  BUN 74* 96* 64* 97* 65* 102* 59*  CREATININE 1.25* 1.48* 1.17 1.64* 1.22 1.55* 1.07  CALCIUM 9.1 9.2 9.2 9.3 9.3 9.4 9.3  PHOS  --  6.3*  --   --   --  5.5*  --    CBC Recent Labs  Lab 10/04/20 0341 10/05/20 0006 10/06/20 0429 10/07/20 1301 10/08/20 0606 10/10/20 0357  WBC 21.2*   < > 20.9* 17.3* 19.1* 21.1*  NEUTROABS 16.7*  --  15.6*  --   --   --   HGB 7.7*   < > 9.1* 8.0* 8.5* 8.9*  HCT 24.0*   < > 27.8* 25.1* 25.9* 29.1*  MCV 76.9*   < > 77.7* 77.5* 75.3* 78.4*  PLT 413*   < > 449* 462* 464* 512*   < > = values in this interval not displayed.    Medications:     amiodarone  200 mg Per Tube Daily   apixaban  5 mg Per Tube BID   buPROPion  75 mg Per Tube BID   chlorhexidine gluconate (MEDLINE KIT)  15 mL Mouth Rinse BID   Chlorhexidine Gluconate Cloth  6 each Topical Q0600   darbepoetin (ARANESP) injection - DIALYSIS  100 mcg Intravenous Q Tue-HD   feeding supplement (PROSource TF)  45 mL Per Tube TID   guaiFENesin  5 mL Per Tube BID   insulin aspart  0-9 Units Subcutaneous Q4H   insulin glargine-yfgn  30 Units Subcutaneous Daily   lidocaine  1 patch Transdermal Q24H   mouth rinse  15 mL Mouth Rinse 10 times per day   metaxalone  800 mg Per Tube TID   midodrine  2.5 mg Per Tube TID WC   multivitamin  1 tablet Per Tube QHS   oxyCODONE  5 mg Per Tube Q4H   pantoprazole sodium  40 mg Per Tube Daily   sertraline  100 mg Per Tube Daily   simethicone  40 mg Per Tube QID   sodium chloride flush  10-40 mL Intracatheter Q12H   sodium hypochlorite   Topical 1 day or 1 dose      [name], MD 10/10/2020,  9:32 AM

## 2020-10-10 NOTE — Progress Notes (Signed)
Video connection link was texted to participant via mobile number provided. Connection was successful.

## 2020-10-10 NOTE — Progress Notes (Signed)
[facility] PROGRESS NOTE  LAZARIUS RIVKIN HEN:[id number] DOB: 1950/05/26 DOA: 07/10/2020 PCP: Townsend Roger, MD  Status: Remains inpatient appropriate because:Hemodynamically unstable, Persistent severe electrolyte disturbances, Unsafe d/c plan, and Inpatient level of care appropriate due to severity of illness  Dispo: The patient is from: SNF-[facility] vent capable              Anticipated d/c is to: SNF Vent capable w/ access to in facility/stretcher based HD-a facility in [place] has been located in preauthorization in progress.              Patient currently is not medically stable to d/c.   Difficult to place patient Yes    Level of care: ICU  Code Status: Partial (no CPR and no defibrillation or cardioversion) Family Communication: Wife June at bedside on 9/21 DVT prophylaxis: IV Heparin COVID vaccination status: Unknown    HPI:  70 y.o. 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 Patient presented secondary to altered mental status and hypotension with recurrent pneumonia requiring vasopressor support and ICU admission.he has had multiple recent admissions for sepsis secondary to various infections including UTIs, pneumonias and bacteremia in setting of MDR Klebsiella, MRSA and pseudomonas. Most recent admission September 2021 in setting of sepsis due to MDR Klebsiella with right ureteral calculus s/p double J ureteral stent placement and treated with meropenem x 10days. He was discharged back to [facility].  ICU significant events: 6/29 admitted to ICU for septic shock on levo to maintain MAP>65, continued on vent support 6/29 Blood Cx>> staph species in 1 of 4 cultures drawn>> suspect contaminant 6/30 Off pressors 7/2 ID Consult for MDR acinetobacter in trach asp, Meropenem changed to unasyn 7/2 PRBC transfusion, iron replacement 7/5 Pressors added again. Central line placed. Drainage from gastrostomy  tube >> CT Abd without fluid collection around gastrostomy tube but with diffuse body wall edema. Echo EF 50-55%, mild LVH 7/6 Renal Replacement Therapy ; Code status changed to partial Code (DNR) 7/7 Transfuse 1U PRBC 7/9 add solu cortef 7/10 off pressors, weaning stress steroids 7/11 Pressors added back on again 7/12 ID consult due to rising WBC, hypothermia, hypotension concern for worsening infection. Vancomycin added. Repeat blood cx collected. 7/15 CRRT stopped 7/17 Restarted on low dose levo. No evidence of new infection. Held off on antibiotics. Nephrology contacted family and decision was made to continue iHD this week to allow additional time for renal recovery. Patient is not the best long term iHD candidate but family wants aggressive care 7/18 plans for iHD, tolerated with 2L removed 719 slight drop in hgb to 6.9 will transfuse 1 unit PRBC 7/22 > 7/26: Tolerating HD. TOC consulted for placement. Will likely need out of state placement. 8/15 WBCs greater than 28,000, hypoglycemia, procalcitonin >18, chest x-ray consistent with pneumonia, recent blood cultures consistent with contamination.  Unasyn IV resumed after sputum culture obtained 8/16 consultation placed with ethics committee regarding evaluation for futility of care 8/16 Sputum cx + for Acinetobacter with sensitivities pending 8/17 extensive conversation held with patient's wife regarding patient's poor prognosis and reemergence of Acinetobacter VAP.  Also discussed with her that patient has been able to convey to multiple staff that he does not want to continue on the ventilator or with dialysis.  Wife reports that the patient does not understand what he is saying, she is the POA and that she wants to continue aggressive care. 8/18 patient now telling nephrology, myself and the rest  of the staff he replies yes to wanting to remain on dialysis and on the ventilator and this direct question is asked. 8/18 Acinetobacter sensitive  to Unasyn only.  Discussed with ID/Dr. Juleen China.  Plan is to administer antibiotics for 7-day since patient has not had any increased O2 needs and remains afebrile.  If any concerns over worsening symptoms can give an additional 3 days. Developed moderate bleeding around tracheostomy site on 8/19.  Likely related to combination of Eliquis and heparin required for CRRT.  We will hold Eliquis for now and monitor degree of bleeding Eliquis resumed on 8/22.  Overnight into 8/23 patient bit his tongue and had bleeding.  Also had some bleeding around trach likely from oral bleeding.  Also though did have bleeding from old central line site. 8/23 had transient bleeding around trach and prior central line insertion site overnight.  Patient had bitten tongue and this likely explain the bleeding.  This morning patient was not having any bleeding.  On 8/25 had redeveloped slow ooze bleeding from mouth and trach.  Eliquis discontinued in favor of IV heparin especially given patient will undergo surgical procedure for vascular access on 8/29 8/30 Left FA AVG occluded 9/2 status post thrombectomy and revision of LUE AV graft 9/4 LUE edema with negative venous duplex. 9/5 heparin discontinued and Eliquis resumed    Subjective:   Objective: Vitals:   10/10/20 0342 10/10/20 0412  BP:    Pulse:    Resp:    Temp: 98.4 F (36.9 C)   SpO2:  98%    Intake/Output Summary (Last 24 hours) at 10/10/2020 0723 Last data filed at 10/10/2020 0354 Gross per 24 hour  Intake 765 ml  Output 2100 ml  Net -1335 ml   Filed Weights   10/09/20 0500 10/09/20 1250 10/10/20 4627  Weight: 79.3 kg 82.4 kg 78.2 kg    Exam:  Constitutional: Awake, flat affect Respiratory: Trach: 6.0 XLT cuffed, Vent settings: PRVC mode, Fio2 40%, rate 20, VT 510, PEEP 5; bilateral lung sounds clear to auscultation anteriorly Cardiovascular: Sinus rhythm -no JVD or peripheral edema except for stable chronic bilateral upper extremity edema,  normotensive -AVG palpable thrill. Abdomen: LBM 9/28, PEG tube,  Left lower quadrant colostomy with small amount of stool, abdomen nontender with normoactive bowel sounds.  Neurologic: Chronic quadriplegia. No sensation below shoulders. Psychiatric: Awake, and flat affect.  Looks to but makes no effort to interact or mouth words.  Typically closes eyes and tries to go back to sleep   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 - ventilator management per PCCM Failed recent SBT secondary to apnea  Recurrent ventilator associated pneumonia 2/2 recurrent Acinetobacter/sepsis without shock Has been treated x2 this admission for Acinetobacter tracheobronchitis/pneumonia    AKI with oliguria/volume overload/ New CKD requiring HD Anuric-Nephrology following, initially required CRRT and has transitioned to HD (LUE AVG functional-revised 9/2 but needs to mature x 4 weeks before can dc   Septic shock 2/2 VAP POA Presented with sepsis physiology and profound shock -resolved  Chronic thoracic back pain Continue hedger low-dose Oxy IR along with IV morphine prn breakthrough pain; continue Skelaxin and lidocaine patch  History of paroxysmal atrial fibrillation Continue amiodarone and Eliquis maintaining SR QTc 420 ms on 0/35   Acute metabolic encephalopathy Resolved-Secondary to acute illness. CT head negative for acute process.   Diabetes mellitus, type 2 Continue Semglee 30 units daily.  Continue SSI w/ CBG checks every  4 hours  Acute diverticulitis Has completed a course of Flagyl Resolved  Nodular irregular thickening of the ascending colon-?  Neoplasm [facility] consulted.  They discussed with his wife.  Given patient's debilitated state/diverticulitis they recommended holding off on colonoscopy for now.  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  Plan is to discharge to facility in [place]   Microcytic anemia/Acute anemia superimposed on anemia of chronic kidney disease S/P 6 units of PRBC -Continue Aranesp and follow Hgb prn   Severe protein calorie malnutrition Continue tube feedings as recommended by nutrition/registered dietitian  Pressure injury/stage IV sacral decubitus Stage IV medial/posterior sacrum present on admission.  Right/posterior/lateral ankle unknown if present on admission.  DTI left ear, not present on admission. (For additional documentation see wound care notes) Patient recently underwent bedside debridement of sacral and heel wound.  No evidence of infection found.  Wounds now with red granular base.  Continue wound packing as prior.    Data Reviewed: Basic Metabolic Panel: Recent Labs  Lab 10/04/20 0341 10/05/20 0006 10/06/20 0429 10/07/20 0731 10/08/20 0606 10/09/20 0654 10/10/20 0357  NA 130* 130* 131* 131* 134* 131* 132*  K 3.7 4.1 3.1* 3.9 3.3* 4.1 3.6  CL 93* 93* 93* 92* 94* 92* 95*  CO2 _0 GLUCOSE 141* 121* 136* 187* 148* 142* 156*  BUN 74* 96* 64* 97* 65* 102* 59*  CREATININE 1.25* 1.48* 1.17 1.64* 1.22 1.55* 1.07  CALCIUM 9.1 9.2 9.2 9.3 9.3 9.4 9.3  MG 2.2  --   --   --   --   --   --   PHOS  --  6.3*  --   --   --  5.5*  --    Liver Function Tests: Recent Labs  Lab 10/04/20 0341 10/05/20 0006 10/06/20 0429 10/09/20 0654  AST 24  --  27  --   ALT 16  --  14  --   ALKPHOS 169*  --  170*  --   BILITOT 0.8  --  1.0  --   PROT 7.0  --  7.5  --   ALBUMIN 1.7* 1.6* 1.8* 1.7*    CBC: Recent Labs  Lab 10/04/20 0341 10/05/20 0006 10/05/20 2000 10/06/20 0429 10/07/20 1301 10/08/20 0606 10/10/20 0357  WBC 21.2* 18.4*   --  20.9* 17.3* 19.1* 21.1*  NEUTROABS 16.7*  --   --  15.6*  --   --   --   HGB 7.7* 7.1* 8.6* 9.1* 8.0* 8.5* 8.9*  HCT 24.0* 22.4* 26.4* 27.8* 25.1* 25.9* 29.1*  MCV 76.9* 77.0*  --  77.7* 77.5* 75.3* 78.4*  PLT 413* 434*  --  449* 462* 464* 512*   CBG: Recent Labs  Lab 10/09/20 0325 10/09/20 0757 10/09/20 1059 10/09/20 1537 10/09/20 1932  GLUCAP 109* 131* 136* 123* 148*      Scheduled Meds:  amiodarone  200 mg Per Tube Daily   apixaban  5 mg Per Tube BID   buPROPion  75 mg Per Tube BID   chlorhexidine gluconate (MEDLINE KIT)  15 mL Mouth Rinse BID   Chlorhexidine Gluconate Cloth  6 each Topical Q0600   darbepoetin (ARANESP) injection - DIALYSIS  100 mcg  Intravenous Q Tue-HD   feeding supplement (PROSource TF)  45 mL Per Tube TID   guaiFENesin  5 mL Per Tube BID   insulin aspart  0-9 Units Subcutaneous Q4H   insulin glargine-yfgn  30 Units Subcutaneous Daily   lidocaine  1 patch Transdermal Q24H   mouth rinse  15 mL Mouth Rinse 10 times per day   metaxalone  800 mg Per Tube TID   midodrine  2.5 mg Per Tube TID WC   multivitamin  1 tablet Per Tube QHS   oxyCODONE  5 mg Per Tube Q4H   pantoprazole sodium  40 mg Per Tube Daily   sertraline  100 mg Per Tube Daily   simethicone  40 mg Per Tube QID   sodium chloride flush  10-40 mL Intracatheter Q12H   sodium hypochlorite   Topical 1 day or 1 dose   Continuous Infusions:  sodium chloride 10 mL/hr at 10/11/2020 1429   sodium chloride 500 mL (09/01/20 1408)   sodium chloride     sodium chloride     feeding supplement (NEPRO CARB STEADY) 1,000 mL (10/09/20 1837)    Principal Problem:   Acute respiratory failure with hypoxia (HCC) Active Problems:   Ventilator dependent ([place])   Septic shock (HCC)   Pressure injury of skin   AKI (acute kidney injury) ([place])   Hypotension   Goals of care, counseling/discussion   Quadriplegia ([place])   Chronic kidney disease requiring chronic dialysis ([place])   Hyperglycemia   Sepsis due  to Acinetobacter baumannii ([place])   Pneumonia due to Acinetobacter species ([place])   Dysautonomia ([place])   Chronic midline thoracic back pain   Abdominal distention   Leukocytosis   Consultants: PCCM Nephrology Infectious disease Palliative care medicine  Procedures: Echocardiogram Insertion of double-lumen HD catheter  Antibiotics: Cefepime x1 dose Meropenem 6/29 through 7/2 Vancomycin 6/29 through 6/30 Unasyn 7/2 through 7/15 Unasyn 8/15 >8/22   Time spent: 25 minutes    Erin Hearing ANP  [facility] 7 am - 330 pm/M-F for direct patient care and secure chat Please refer to Amion for contact info 91  days

## 2020-10-11 DIAGNOSIS — J9601 Acute respiratory failure with hypoxia: Secondary | ICD-10-CM | POA: Diagnosis not present

## 2020-10-11 LAB — RENAL FUNCTION PANEL
Albumin: 1.7 g/dL — ABNORMAL LOW (ref 3.5–5.0)
Anion gap: 15 (ref 5–15)
BUN: 94 mg/dL — ABNORMAL HIGH (ref 8–23)
CO2: 22 mmol/L (ref 22–32)
Calcium: 9.6 mg/dL (ref 8.9–10.3)
Chloride: 93 mmol/L — ABNORMAL LOW (ref 98–111)
Creatinine, Ser: 1.48 mg/dL — ABNORMAL HIGH (ref 0.61–1.24)
GFR, Estimated: 51 mL/min — ABNORMAL LOW (ref >60)
Glucose, Bld: 158 mg/dL — ABNORMAL HIGH (ref 70–99)
Phosphorus: 4.7 mg/dL — ABNORMAL HIGH (ref 2.5–4.6)
Potassium: 3.9 mmol/L (ref 3.5–5.1)
Sodium: 130 mmol/L — ABNORMAL LOW (ref 135–145)

## 2020-10-11 LAB — GLUCOSE, CAPILLARY
Glucose-Capillary: 129 mg/dL — ABNORMAL HIGH (ref 70–99)
Glucose-Capillary: 127 mg/dL — ABNORMAL HIGH (ref 70–99)
Glucose-Capillary: 173 mg/dL — ABNORMAL HIGH (ref 70–99)
Glucose-Capillary: 157 mg/dL — ABNORMAL HIGH (ref 70–99)
Glucose-Capillary: 145 mg/dL — ABNORMAL HIGH (ref 70–99)

## 2020-10-11 LAB — CBC
HCT: 26.7 % — ABNORMAL LOW (ref 39.0–52.0)
Hemoglobin: 8.3 g/dL — ABNORMAL LOW (ref 13.0–17.0)
MCH: 23.8 pg — ABNORMAL LOW (ref 26.0–34.0)
MCHC: 31.1 g/dL (ref 30.0–36.0)
MCV: 76.5 fL — ABNORMAL LOW (ref 80.0–100.0)
Platelets: 487 10*3/uL — ABNORMAL HIGH (ref 150–400)
RBC: 3.49 MIL/uL — ABNORMAL LOW (ref 4.22–5.81)
RDW: 20.8 % — ABNORMAL HIGH (ref 11.5–15.5)
WBC: 21.5 10*3/uL — ABNORMAL HIGH (ref 4.0–10.5)
nRBC: 1.5 % — ABNORMAL HIGH (ref 0.0–0.2)

## 2020-10-11 LAB — PROCALCITONIN: Procalcitonin: 12.82 ng/mL

## 2020-10-11 MED ORDER — LIDOCAINE HCL (PF) 1 % IJ SOLN: 5.0000 mL | INTRAMUSCULAR | Status: DC | PRN

## 2020-10-11 MED ORDER — HEPARIN SODIUM (PORCINE) 1000 UNIT/ML DIALYSIS: 1000.0000 [IU] | INTRAMUSCULAR | Status: DC | PRN

## 2020-10-11 MED ORDER — SODIUM CHLORIDE 0.9 % IV SOLN: 100.0000 mL | INTRAVENOUS | Status: DC | PRN

## 2020-10-11 MED ORDER — ALTEPLASE 2 MG IJ SOLR: 2.0000 mg | Freq: Once | INTRAMUSCULAR | Status: DC | PRN

## 2020-10-11 MED ORDER — LIDOCAINE-PRILOCAINE 2.5-2.5 % EX CREA: 1.0000 "application " | TOPICAL_CREAM | CUTANEOUS | Status: DC | PRN

## 2020-10-11 MED ORDER — PENTAFLUOROPROP-TETRAFLUOROETH EX AERO: 1.0000 "application " | INHALATION_SPRAY | CUTANEOUS | Status: DC | PRN

## 2020-10-11 NOTE — Progress Notes (Signed)
Nutrition Follow-up  DOCUMENTATION CODES:   Not applicable  INTERVENTION:   Continue tube feeds via G-tube: Nepro at 45 ml/hr (1080 ml/day) ProSource TF 45 ml TID  Provides 2064 kcal, 120 grams of protein, and 785 ml free water daily.  Continue renal MVI daily per tube.  NUTRITION DIAGNOSIS:   Increased nutrient needs related to wound healing as evidenced by estimated needs.  Ongoing  GOAL:   Patient will meet greater than or equal to 90% of their needs  Met with TF  MONITOR:   Vent status, Labs, Weight trends, TF tolerance, Skin, I & O's  REASON FOR ASSESSMENT:   Ventilator, Consult Enteral/tube feeding initiation and management  ASSESSMENT:   70 year old male who presented to the ED on 6/29 from [facility] with AMS and hypotension. 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 Pt admitted with septic shock, acute on chronic hypoxemic respiratory failure.  G-tube in place. Patient continues to tolerate Nepro at 45 ml/h with Prosource TF 45 ml TID.    Currently receiving iHD via AVG on TTS schedule.  Last HD today (9/29) with 1.5 L UF.   Admit weight: 90.2 kg Current weight: 77.1 kg Lowest weight since admit: 72 kg on 8/3  Patient remains on ventilator support via trach. MV: 9.2 L/min Temp (24hrs), Avg:98.5 F (36.9 C), Min:98 F (36.7 C), Max:99.4 F (37.4 C)  Medications reviewed and include: aranesp injection weekly, Novolog SSI q 4 hours, semglee 30 units daily, rena-vit, protonix.  Labs reviewed: Na 130 CBG: [phone]  Colostomy: 300 ml x 24 hours UOP: 0 ml x 24 hours I/O +17.4 L since admission  Diet Order:   Diet Order     None       EDUCATION NEEDS:   No education needs have been identified at this time  Skin:  Skin Assessment: Skin Integrity Issues: DTI: L  ear Stage IV: sacrum Stage II: R heel  Last BM:  9/29 colostomy  Height:   Ht Readings from Last 1 Encounters:  09/24/20 5' 6"  (1.676 m)    Weight:   Wt Readings from Last 1 Encounters:  10/11/20 77.1 kg    Ideal Body Weight:  58 kg (adjusted for quadriplegia)  BMI:  Body mass index is 27.43 kg/m.  Estimated Nutritional Needs:   Kcal:  1900-2100  Protein:  110-120 gm  Fluid:  1 L + UOP   Lucas Mallow, RD, LDN, CNSC Please refer to Amion for contact information.

## 2020-10-11 NOTE — Progress Notes (Signed)
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  Presented from [facility] with AMS  hypotension and pneumonia resultant septic shock is admitted to the intensive care unit.  Has had multiple admissions in the past for Klebsiella, MRSA and Pseudomonas urinary tract infections and pneumonia.  His most recent admission 10/03/2020 with multiple drug-resistant Klebsiella and right ureteral calculus requiring double-J stent.  Renal replacement therapy was initiated 07/18/2020.     Underwent successful dialysis 10/05/2020 with 2 L removed.  Assessment/ Plan:   1.Acute kidney Injury-prolonged dialysis dependency, now progressed to ESRD:  ATN from septic shock-without any evidence of renal recovery to date . initiated on CRRT 07/18/20 and transitioned to IHD.  Has TDC & AVG, he received dialysis after midnight 10/05/2020, 9/25, 9/27 (net UF 2L).  On for 9/27 (unable to HD yest bec of logistics); markedly overloaded.  Tolerated HD through the lt FAL AVG on 9/29 with 1.5L net UF.  Next HD planned will be Fri + Sat for UF.   If successful with treatments through next week then would remove the TC.    2 Vascular access - had left forearm AVG placed 08/17/2020 but unfortunately had clotted.  s/p thrombectomy and revision 9/2, graft can be used 4 weeks thereafter.  Swelling of left upper extremity noted continue arm elevation and supportive care.    - Tolerated  challenge with new cannulation on 9/28. Initial FAL AVG placed by Dr. Stanford Breed on 08/24/2020.   3 Septic shock/ventilator associated pneumonia due to recurrent Acinetobacter infection: Off antibiotics and stable hemodynamics.   4 Leukocytosis -persistent, will defer to primary service for further mgmt   5 Chronic respiratory failure: Status post tracheostomy with ventilator management per CCM.   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   7 Chronic kidney disease/metabolic bone disease: Calcium and phosphorus level under control with hemodialysis, off binders   8 Anemia: contiue ESA, monitor Hb.  Transfuse as needed.   9 Acute metabolic encephalopathy: Continue management as above.,  Supportive care.   10 Disposition - pt cannot return to [facility] now that he is ESRD. He is going to [facility] in [place], [place]. He needs to have [place] Medicaid & his AVG needs to be mature prior to being accepted. Other possible barrier is pt is a permanent trach & vent pt.    Subjective:   No acute events overnight.   Objective:   BP (!) 144/77   Pulse 92   Temp 98.1 F (36.7 C) (Oral)   Resp 20   Ht _0  (1.676 m)   Wt 77.1 kg   SpO2 98%   BMI 27.43 kg/m   Intake/Output Summary (Last 24 hours) at 10/11/2020 1041 Last data filed at 10/11/2020 0900 Gross per 24 hour  Intake 1430 ml  Output 1800 ml  Net -370 ml   Weight change: -3.8 kg  Physical Exam: General:NAD, tracheostomy on vent. Heart:RRR, s1s2 nl Lungs: trach, no increased work of breathing. Abdomen:soft, Non-tender, PEG tube and ostomy bag Extremities:Trace lower extremities edema Neuro: awake Dialysis Access: RIJ TDC, Lt FAL AVG decent bruit  w/ auscultation w/ bandages in placee  Imaging: No results found.  Labs: BMET Recent Labs  Lab 10/05/20 0006 10/06/20 0429 10/07/20 0731 10/08/20 0606 10/09/20 0654 10/10/20 0357 10/11/20 0430  NA 130* 131* 131* 134* 131* 132* 130*  K 4.1 3.1* 3.9 3.3* 4.1 3.6 3.9  CL 93* 93* 92* 94* 92* 95* 93*  CO2 _0 GLUCOSE 121* 136* 187* 148* 142* 156* 158*  BUN 96* 64* 97* 65* 102* 59* 94*  CREATININE 1.48* 1.17 1.64* 1.22 1.55* 1.07 1.48*  CALCIUM 9.2 9.2 9.3 9.3 9.4 9.3 9.6  PHOS 6.3*  --   --   --  5.5*  --  4.7*   CBC Recent Labs  Lab 10/06/20 0429 10/07/20 1301 10/08/20 0606 10/10/20 0357 10/11/20 0430  WBC 20.9* 17.3* 19.1* 21.1*  21.5*  NEUTROABS 15.6*  --   --   --   --   HGB 9.1* 8.0* 8.5* 8.9* 8.3*  HCT 27.8* 25.1* 25.9* 29.1* 26.7*  MCV 77.7* 77.5* 75.3* 78.4* 76.5*  PLT 449* 462* 464* 512* 487*    Medications:     amiodarone  200 mg Per Tube Daily   apixaban  5 mg Per Tube BID   buPROPion  75 mg Per Tube BID   chlorhexidine gluconate (MEDLINE KIT)  15 mL Mouth Rinse BID   Chlorhexidine Gluconate Cloth  6 each Topical Q0600   darbepoetin (ARANESP) injection - DIALYSIS  100 mcg Intravenous Q Tue-HD   feeding supplement (PROSource TF)  45 mL Per Tube TID   guaiFENesin  5 mL Per Tube BID   insulin aspart  0-9 Units Subcutaneous Q4H   insulin glargine-yfgn  30 Units Subcutaneous Daily   lidocaine  1 patch Transdermal Q24H   mouth rinse  15 mL Mouth Rinse 10 times per day   metaxalone  800 mg Per Tube TID   midodrine  2.5 mg Per Tube TID WC   multivitamin  1 tablet Per Tube QHS   oxyCODONE  5 mg Per Tube Q4H   pantoprazole sodium  40 mg Per Tube Daily   sertraline  100 mg Per Tube Daily   simethicone  40 mg Per Tube QID   sodium chloride flush  10-40 mL Intracatheter Q12H   sodium hypochlorite   Topical 1 day or 1 dose      [name], MD 10/11/2020, 10:41 AM

## 2020-10-11 NOTE — Progress Notes (Signed)
[facility] PROGRESS NOTE  Luis Orr HEN:[id number] DOB: 1950/05/26 DOA: 07/05/2020 PCP: Townsend Roger, MD  Status: Remains inpatient appropriate because:Hemodynamically unstable, Persistent severe electrolyte disturbances, Unsafe d/c plan, and Inpatient level of care appropriate due to severity of illness  Dispo: The patient is from: SNF-[facility] vent capable              Anticipated d/c is to: SNF Vent capable w/ access to in facility/stretcher based HD-a facility in [place] has been located in preauthorization in progress.              Patient currently is not medically stable to d/c.   Difficult to place patient Yes    Level of care: ICU  Code Status: Partial (no CPR and no defibrillation or cardioversion) Family Communication: Wife June at bedside on 9/21 DVT prophylaxis: IV Heparin COVID vaccination status: Unknown    HPI:  70 y.o. 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 Patient presented secondary to altered mental status and hypotension with recurrent pneumonia requiring vasopressor support and ICU admission.he has had multiple recent admissions for sepsis secondary to various infections including UTIs, pneumonias and bacteremia in setting of MDR Klebsiella, MRSA and pseudomonas. Most recent admission September 2021 in setting of sepsis due to MDR Klebsiella with right ureteral calculus s/p double J ureteral stent placement and treated with meropenem x 10days. He was discharged back to [facility].  ICU significant events: 6/29 admitted to ICU for septic shock on levo to maintain MAP>65, continued on vent support 6/29 Blood Cx>> staph species in 1 of 4 cultures drawn>> suspect contaminant 6/30 Off pressors 7/2 ID Consult for MDR acinetobacter in trach asp, Meropenem changed to unasyn 7/2 PRBC transfusion, iron replacement 7/5 Pressors added again. Central line placed. Drainage from gastrostomy  tube >> CT Abd without fluid collection around gastrostomy tube but with diffuse body wall edema. Echo EF 50-55%, mild LVH 7/6 Renal Replacement Therapy ; Code status changed to partial Code (DNR) 7/7 Transfuse 1U PRBC 7/9 add solu cortef 7/10 off pressors, weaning stress steroids 7/11 Pressors added back on again 7/12 ID consult due to rising WBC, hypothermia, hypotension concern for worsening infection. Vancomycin added. Repeat blood cx collected. 7/15 CRRT stopped 7/17 Restarted on low dose levo. No evidence of new infection. Held off on antibiotics. Nephrology contacted family and decision was made to continue iHD this week to allow additional time for renal recovery. Patient is not the best long term iHD candidate but family wants aggressive care 7/18 plans for iHD, tolerated with 2L removed 719 slight drop in hgb to 6.9 will transfuse 1 unit PRBC 7/22 > 7/26: Tolerating HD. TOC consulted for placement. Will likely need out of state placement. 8/15 WBCs greater than 28,000, hypoglycemia, procalcitonin >18, chest x-ray consistent with pneumonia, recent blood cultures consistent with contamination.  Unasyn IV resumed after sputum culture obtained 8/16 consultation placed with ethics committee regarding evaluation for futility of care 8/16 Sputum cx + for Acinetobacter with sensitivities pending 8/17 extensive conversation held with patient's wife regarding patient's poor prognosis and reemergence of Acinetobacter VAP.  Also discussed with her that patient has been able to convey to multiple staff that he does not want to continue on the ventilator or with dialysis.  Wife reports that the patient does not understand what he is saying, she is the POA and that she wants to continue aggressive care. 8/18 patient now telling nephrology, myself and the rest  of the staff he replies yes to wanting to remain on dialysis and on the ventilator and this direct question is asked. 8/18 Acinetobacter sensitive  to Unasyn only.  Discussed with ID/Dr. Juleen China.  Plan is to administer antibiotics for 7-day since patient has not had any increased O2 needs and remains afebrile.  If any concerns over worsening symptoms can give an additional 3 days. Developed moderate bleeding around tracheostomy site on 8/19.  Likely related to combination of Eliquis and heparin required for CRRT.  We will hold Eliquis for now and monitor degree of bleeding Eliquis resumed on 8/22.  Overnight into 8/23 patient bit his tongue and had bleeding.  Also had some bleeding around trach likely from oral bleeding.  Also though did have bleeding from old central line site. 8/23 had transient bleeding around trach and prior central line insertion site overnight.  Patient had bitten tongue and this likely explain the bleeding.  This morning patient was not having any bleeding.  On 8/25 had redeveloped slow ooze bleeding from mouth and trach.  Eliquis discontinued in favor of IV heparin especially given patient will undergo surgical procedure for vascular access on 8/29 8/30 Left FA AVG occluded 9/2 status post thrombectomy and revision of LUE AV graft 9/4 LUE edema with negative venous duplex. 9/5 heparin discontinued and Eliquis resumed    Subjective: Awake and did look at me when I spoke to him but did not interact or attempt to respond.  Objective: Vitals:   10/11/20 0600 10/11/20 0715  BP: 104/68   Pulse:    Resp: 20   Temp:  98.1 F (36.7 C)  SpO2:      Intake/Output Summary (Last 24 hours) at 10/11/2020 5465 Last data filed at 10/11/2020 0700 Gross per 24 hour  Intake 1475 ml  Output 300 ml  Net 1175 ml   Filed Weights   10/09/20 1250 10/10/20 0212 10/11/20 0406  Weight: 82.4 kg 78.2 kg 78.6 kg    Exam:  Constitutional: Awake, flat affect Respiratory: Trach: 6.0 XLT cuffed, PRVC mode, FiO2 40% -rate 20-PEEP 5; lungs coarse but clear Cardiovascular: Sinus rhythm -no JVD; continues with significant edema LUE  primarily of the hand and forearm -arm elevated on pillow normotensive -AVG palpable thrill. Abdomen: LBM 9/28, PEG tube,  Left lower quadrant colostomy with small amount of stool, abdomen nontender with normoactive bowel sounds.  Neurologic: Chronic quadriplegia. No sensation below shoulders. Psychiatric: Awake, and flat affect.  Appears depressed.   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 - ventilator management per PCCM Failed recent SBT secondary to apnea  Recurrent ventilator associated pneumonia 2/2 recurrent Acinetobacter/sepsis without shock Has been treated x2 this admission for Acinetobacter tracheobronchitis/pneumonia    AKI with oliguria/volume overload/ New CKD requiring HD Anuric-Nephrology following, initially required CRRT and has transitioned to HD (LUE AVG functional-revised 9/2 but needs to mature x 4 weeks before can dc   Septic shock 2/2 VAP POA Presented with sepsis physiology and profound shock -resolved  Chronic thoracic back pain Continue scheduled low-dose Oxy IR along with IV morphine prn breakthrough pain; continue Skelaxin and lidocaine patch  History of paroxysmal atrial fibrillation Continue amiodarone and Eliquis maintaining SR QTc 420 ms on 8/14-repeat EKG on 9/30 to follow-up QTC   Acute metabolic encephalopathy Resolved  Diabetes mellitus, type 2 Continue Semglee 30 units daily.  Continue SSI w/ CBG checks every 4 hours  Acute diverticulitis Resolved after treatment with Flagyl  Nodular irregular thickening of the ascending colon-?  Neoplasm [facility] consulted.  They discussed with his wife.  Given patient's debilitated state/diverticulitis they recommended holding off on colonoscopy for now.  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  Plan is to discharge to facility in [place]   Microcytic anemia/Acute anemia superimposed on anemia of chronic kidney disease S/P 6 units of PRBC -Continue Aranesp; Hb average between 8 and 9   Severe protein calorie malnutrition Continue tube feedings as recommended by nutrition/registered dietitian  Pressure injury/stage IV sacral decubitus Stage IV medial/posterior sacrum present on admission.  Right/posterior/lateral ankle unknown if present on admission.  DTI left ear, not present on admission. (For additional documentation see wound care notes) Patient recently underwent bedside debridement of sacral and heel wound.  No evidence of infection found.  Wounds now with red granular base.  Continue wound packing as prior.    Data Reviewed: Basic Metabolic Panel: Recent Labs  Lab 10/05/20 0006 10/06/20 0429 10/07/20 0731 10/08/20 0606 10/09/20 0654 10/10/20 0357 10/11/20 0430  NA 130*   < > 131* 134* 131* 132* 130*  K 4.1   < > 3.9 3.3* 4.1 3.6 3.9  CL 93*   < > 92* 94* 92* 95* 93*  CO2 23   < > _0 GLUCOSE 121*   < > 187* 148* 142* 156* 158*  BUN 96*   < > 97* 65* 102* 59* 94*  CREATININE 1.48*   < > 1.64* 1.22 1.55* 1.07 1.48*  CALCIUM 9.2   < > 9.3 9.3 9.4 9.3 9.6  PHOS 6.3*  --   --   --  5.5*  --  4.7*   < > = values in this interval not displayed.   Liver Function Tests: Recent Labs  Lab 10/05/20 0006 10/06/20 0429 10/09/20 0654 10/11/20 0430  AST  --  27  --   --   ALT  --  14  --   --   ALKPHOS  --  170*  --   --   BILITOT  --  1.0  --   --   PROT  --  7.5  --   --   ALBUMIN 1.6* 1.8* 1.7* 1.7*    CBC: Recent Labs  Lab 10/06/20 0429 10/07/20 1301 10/08/20 0606 10/10/20 0357 10/11/20 0430  WBC 20.9* 17.3* 19.1* 21.1* 21.5*  NEUTROABS 15.6*  --   --   --   --    HGB 9.1* 8.0* 8.5* 8.9* 8.3*  HCT 27.8* 25.1* 25.9* 29.1* 26.7*  MCV 77.7* 77.5* 75.3* 78.4* 76.5*  PLT 449* 462* 464* 512* 487*   CBG: Recent Labs  Lab 10/10/20 1553 10/10/20 1943 10/10/20 2337 10/11/20 0354 10/11/20 0713  GLUCAP 153* 149* 142* 129* 127*      Scheduled Meds:  amiodarone  200 mg Per Tube Daily   apixaban  5 mg Per Tube BID   buPROPion  75 mg Per Tube BID   chlorhexidine gluconate (MEDLINE KIT)  15 mL Mouth Rinse BID   Chlorhexidine Gluconate Cloth  6 each Topical Q0600   darbepoetin (ARANESP) injection - DIALYSIS  100 mcg Intravenous Q Tue-HD   feeding supplement (PROSource TF)  45 mL Per  Tube TID   guaiFENesin  5 mL Per Tube BID   insulin aspart  0-9 Units Subcutaneous Q4H   insulin glargine-yfgn  30 Units Subcutaneous Daily   lidocaine  1 patch Transdermal Q24H   mouth rinse  15 mL Mouth Rinse 10 times per day   metaxalone  800 mg Per Tube TID   midodrine  2.5 mg Per Tube TID WC   multivitamin  1 tablet Per Tube QHS   oxyCODONE  5 mg Per Tube Q4H   pantoprazole sodium  40 mg Per Tube Daily   sertraline  100 mg Per Tube Daily   simethicone  40 mg Per Tube QID   sodium chloride flush  10-40 mL Intracatheter Q12H   sodium hypochlorite   Topical 1 day or 1 dose   Continuous Infusions:  sodium chloride 10 mL/hr at 09/30/2020 1429   sodium chloride 500 mL (09/01/20 1408)   sodium chloride     sodium chloride     feeding supplement (NEPRO CARB STEADY) 1,000 mL (10/09/20 1837)    Principal Problem:   Acute respiratory failure with hypoxia (HCC) Active Problems:   Ventilator dependent ([place])   Sacral decubitus ulcer   Septic shock (HCC)   Pressure injury of skin   AKI (acute kidney injury) ([place])   Hypotension   Goals of care, counseling/discussion   Quadriplegia ([place])   Chronic kidney disease requiring chronic dialysis ([place])   Hyperglycemia   Sepsis due to Acinetobacter baumannii ([place])   Pneumonia due to Acinetobacter species ([place])    Dysautonomia ([place])   Chronic midline thoracic back pain   Abdominal distention   Leukocytosis   Consultants: PCCM Nephrology Infectious disease Palliative care medicine  Procedures: Echocardiogram Insertion of double-lumen HD catheter  Antibiotics: Cefepime x1 dose Meropenem 6/29 through 7/2 Vancomycin 6/29 through 6/30 Unasyn 7/2 through 7/15 Unasyn 8/15 >8/22   Time spent: 25 minutes    Erin Hearing ANP  [facility] 7 am - 330 pm/M-F for direct patient care and secure chat Please refer to Amion for contact info 92  days

## 2020-10-12 DIAGNOSIS — J9601 Acute respiratory failure with hypoxia: Secondary | ICD-10-CM | POA: Diagnosis not present

## 2020-10-12 LAB — GLUCOSE, CAPILLARY
Glucose-Capillary: 119 mg/dL — ABNORMAL HIGH (ref 70–99)
Glucose-Capillary: 131 mg/dL — ABNORMAL HIGH (ref 70–99)
Glucose-Capillary: 132 mg/dL — ABNORMAL HIGH (ref 70–99)
Glucose-Capillary: 135 mg/dL — ABNORMAL HIGH (ref 70–99)
Glucose-Capillary: 141 mg/dL — ABNORMAL HIGH (ref 70–99)
Glucose-Capillary: 147 mg/dL — ABNORMAL HIGH (ref 70–99)
Glucose-Capillary: 157 mg/dL — ABNORMAL HIGH (ref 70–99)

## 2020-10-12 NOTE — Progress Notes (Signed)
[facility] PROGRESS NOTE  Luis Orr HEN:[id number] DOB: 1950/05/26 DOA: 06/19/2020 PCP: Townsend Roger, MD  Status: Remains inpatient appropriate because:Hemodynamically unstable, Persistent severe electrolyte disturbances, Unsafe d/c plan, and Inpatient level of care appropriate due to severity of illness  Dispo: The patient is from: [facility] vent capable              Anticipated d/c is to: SNF Vent capable w/ access to in facility/stretcher based HD-a facility in [place] has been located in preauthorization in progress.              Patient currently is not medically stable to d/c.   Difficult to place patient Yes    Level of care: ICU  Code Status: Partial (no CPR and no defibrillation or cardioversion) Family Communication: Wife June at bedside on 9/21 DVT prophylaxis: IV Heparin COVID vaccination status: Unknown    HPI:  70 y.o. 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 Patient presented secondary to altered mental status and hypotension with recurrent pneumonia requiring vasopressor support and ICU admission.he has had multiple recent admissions for sepsis secondary to various infections including UTIs, pneumonias and bacteremia in setting of MDR Klebsiella, MRSA and pseudomonas. Most recent admission September 2021 in setting of sepsis due to MDR Klebsiella with right ureteral calculus s/p double J ureteral stent placement and treated with meropenem x 10days. He was discharged back to [facility].  ICU significant events: 6/29 admitted to ICU for septic shock on levo to maintain MAP>65, continued on vent support 6/29 Blood Cx>> staph species in 1 of 4 cultures drawn>> suspect contaminant 6/30 Off pressors 7/2 ID Consult for MDR acinetobacter in trach asp, Meropenem changed to unasyn 7/2 PRBC transfusion, iron replacement 7/5 Pressors added again. Central line placed. Drainage from gastrostomy  tube >> CT Abd without fluid collection around gastrostomy tube but with diffuse body wall edema. Echo EF 50-55%, mild LVH 7/6 Renal Replacement Therapy ; Code status changed to partial Code (DNR) 7/7 Transfuse 1U PRBC 7/9 add solu cortef 7/10 off pressors, weaning stress steroids 7/11 Pressors added back on again 7/12 ID consult due to rising WBC, hypothermia, hypotension concern for worsening infection. Vancomycin added. Repeat blood cx collected. 7/15 CRRT stopped 7/17 Restarted on low dose levo. No evidence of new infection. Held off on antibiotics. Nephrology contacted family and decision was made to continue iHD this week to allow additional time for renal recovery. Patient is not the best long term iHD candidate but family wants aggressive care 7/18 plans for iHD, tolerated with 2L removed 719 slight drop in hgb to 6.9 will transfuse 1 unit PRBC 7/22 > 7/26: Tolerating HD. TOC consulted for placement. Will likely need out of state placement. 8/15 WBCs greater than 28,000, hypoglycemia, procalcitonin >18, chest x-ray consistent with pneumonia, recent blood cultures consistent with contamination.  Unasyn IV resumed after sputum culture obtained 8/16 consultation placed with ethics committee regarding evaluation for futility of care 8/16 Sputum cx + for Acinetobacter with sensitivities pending 8/17 extensive conversation held with patient's wife regarding patient's poor prognosis and reemergence of Acinetobacter VAP.  Also discussed with her that patient has been able to convey to multiple staff that he does not want to continue on the ventilator or with dialysis.  Wife reports that the patient does not understand what he is saying, she is the POA and that she wants to continue aggressive care. 8/18 patient now telling nephrology, myself and the rest  of the staff he replies yes to wanting to remain on dialysis and on the ventilator and this direct question is asked. 8/18 Acinetobacter sensitive  to Unasyn only.  Discussed with ID/Dr. Juleen China.  Plan is to administer antibiotics for 7-day since patient has not had any increased O2 needs and remains afebrile.  If any concerns over worsening symptoms can give an additional 3 days. Developed moderate bleeding around tracheostomy site on 8/19.  Likely related to combination of Eliquis and heparin required for CRRT.  We will hold Eliquis for now and monitor degree of bleeding Eliquis resumed on 8/22.  Overnight into 8/23 patient bit his tongue and had bleeding.  Also had some bleeding around trach likely from oral bleeding.  Also though did have bleeding from old central line site. 8/23 had transient bleeding around trach and prior central line insertion site overnight.  Patient had bitten tongue and this likely explain the bleeding.  This morning patient was not having any bleeding.  On 8/25 had redeveloped slow ooze bleeding from mouth and trach.  Eliquis discontinued in favor of IV heparin especially given patient will undergo surgical procedure for vascular access on 8/29 8/30 Left FA AVG occluded 9/2 status post thrombectomy and revision of LUE AV graft 9/4 LUE edema with negative venous duplex. 9/5 heparin discontinued and Eliquis resumed    Subjective: Awake.  He will look at you when you speak to him but makes no effort to acknowledge what you said.  Continues to have a blank stare on his face.  Objective: Vitals:   10/12/20 0500 10/12/20 0600  BP: 132/71 113/67  Pulse: 89 85  Resp: 20 (!) 21  Temp:    SpO2: 99% 99%    Intake/Output Summary (Last 24 hours) at 10/12/2020 0745 Last data filed at 10/12/2020 0600 Gross per 24 hour  Intake 630 ml  Output --  Net 630 ml   Filed Weights   10/10/20 0212 10/11/20 0406 10/11/20 0735  Weight: 78.2 kg 78.6 kg 77.1 kg    Exam:  Constitutional: Awake, flat affect Respiratory: Trach: 6.0 XLT cuffed, PRVC mode, FiO2 40% -rate 20-PEEP 5; lungs are essentially clear coarse without  rhonchi.  Has yellow secretions around trach insertion site and on trach dressing. Cardiovascular: Sinus rhythm -no JVD; continues with significant edema LUE primarily of the hand and forearm -arm elevated on pillow normotensive -AVG palpable thrill. Abdomen: LBM 9/28, PEG tube,  Left lower quadrant colostomy with small amount of stool, abdomen nontender with normoactive bowel sounds.  Neurologic: Chronic quadriplegia. No sensation below shoulders. Psychiatric: Awake, and flat affect.  Appears depressed.   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 - ventilator management per PCCM Failed recent SBT secondary to apnea  Recurrent ventilator associated pneumonia 2/2 recurrent Acinetobacter/sepsis without shock Has been treated x2 this admission for Acinetobacter tracheobronchitis/pneumonia    AKI with oliguria/volume overload/ New CKD requiring HD Anuric-Nephrology following, initially required CRRT and has transitioned to HD (LUE AVG functional-revised 9/2 but needs to mature x 4 weeks before can dc   Septic shock 2/2 VAP POA resolved  Chronic thoracic back pain Continue scheduled low-dose Oxy IR along, IV morphine prn, Skelaxin and lidocaine patch   History of paroxysmal atrial fibrillation Continue amiodarone and Eliquis 9/30 QTC 385 ms   Acute metabolic encephalopathy Resolved  Diabetes mellitus, type 2 Continue Semglee 30 units daily.  Continue SSI w/ CBG checks every 4 hours  Acute diverticulitis  Resolved after treatment with Flagyl  Nodular irregular thickening of the ascending colon-?  Neoplasm [facility] consulted.  They discussed with his wife.  Given patient's debilitated state/diverticulitis they recommended holding off on colonoscopy for now.  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  Plan is to discharge to facility in [place]   Microcytic anemia/Acute anemia superimposed on anemia of chronic kidney disease S/P 6 units of PRBC -Continue Aranesp; Hb average between 8 and 9   Severe protein calorie malnutrition Continue tube feedings as recommended by nutrition/registered dietitian  Pressure injury/stage IV sacral decubitus Stage IV medial/posterior sacrum present on admission.  Right/posterior/lateral ankle unknown if present on admission.  DTI left ear, not present on admission. (For additional documentation see wound care notes) Patient recently underwent bedside debridement of sacral and heel wound.  No evidence of infection found.  Wounds now with red granular base.  Continue wound packing as prior.    Data Reviewed: Basic Metabolic Panel: Recent Labs  Lab 10/07/20 0731 10/08/20 0606 10/09/20 0654 10/10/20 0357 10/11/20 0430  NA 131* 134* 131* 132* 130*  K 3.9 3.3* 4.1 3.6 3.9  CL 92* 94* 92* 95* 93*  CO2 _0 GLUCOSE 187* 148* 142* 156* 158*  BUN 97* 65* 102* 59* 94*  CREATININE 1.64* 1.22 1.55* 1.07 1.48*  CALCIUM 9.3 9.3 9.4 9.3 9.6  PHOS  --   --  5.5*  --  4.7*   Liver Function Tests: Recent Labs  Lab 10/06/20 0429 10/09/20 0654 10/11/20 0430  AST 27  --   --   ALT 14  --   --   ALKPHOS 170*  --   --   BILITOT 1.0  --   --   PROT 7.5  --   --   ALBUMIN 1.8* 1.7* 1.7*    CBC: Recent Labs  Lab 10/06/20 0429 10/07/20 1301 10/08/20 0606 10/10/20 0357 10/11/20 0430  WBC 20.9* 17.3* 19.1* 21.1* 21.5*  NEUTROABS 15.6*  --   --   --   --   HGB 9.1* 8.0* 8.5* 8.9* 8.3*  HCT 27.8* 25.1* 25.9* 29.1* 26.7*  MCV 77.7* 77.5* 75.3* 78.4* 76.5*  PLT 449* 462* 464* 512* 487*   CBG: Recent Labs  Lab 10/11/20 1158 10/11/20 1503  10/11/20 1937 10/12/20 0027 10/12/20 0353  GLUCAP 173* 157* 145* 131* 119*      Scheduled Meds:  amiodarone  200 mg Per Tube Daily   apixaban  5 mg Per Tube BID   buPROPion  75 mg Per Tube BID   chlorhexidine gluconate (MEDLINE KIT)  15 mL Mouth Rinse BID   Chlorhexidine Gluconate Cloth  6 each Topical Q0600   darbepoetin (ARANESP) injection - DIALYSIS  100 mcg Intravenous Q Tue-HD   feeding supplement (PROSource TF)  45 mL Per Tube TID   guaiFENesin  5 mL Per Tube BID   insulin aspart  0-9 Units Subcutaneous Q4H   insulin glargine-yfgn  30 Units Subcutaneous Daily   lidocaine  1 patch Transdermal Q24H   mouth rinse  15 mL Mouth Rinse 10 times per day   metaxalone  800 mg Per Tube TID   midodrine  2.5 mg Per Tube TID WC  multivitamin  1 tablet Per Tube QHS   oxyCODONE  5 mg Per Tube Q4H   pantoprazole sodium  40 mg Per Tube Daily   sertraline  100 mg Per Tube Daily   simethicone  40 mg Per Tube QID   sodium chloride flush  10-40 mL Intracatheter Q12H   sodium hypochlorite   Topical 1 day or 1 dose   Continuous Infusions:  sodium chloride 10 mL/hr at 10/07/2020 1429   sodium chloride 500 mL (09/01/20 1408)   sodium chloride     sodium chloride     feeding supplement (NEPRO CARB STEADY) 1,000 mL (10/11/20 1739)    Principal Problem:   Acute respiratory failure with hypoxia (HCC) Active Problems:   Ventilator dependent ([place])   Sacral decubitus ulcer   Septic shock (HCC)   Pressure injury of skin   AKI (acute kidney injury) ([place])   Hypotension   Goals of care, counseling/discussion   Quadriplegia ([place])   Chronic kidney disease requiring chronic dialysis ([place])   Hyperglycemia   Sepsis due to Acinetobacter baumannii ([place])   Pneumonia due to Acinetobacter species ([place])   Dysautonomia ([place])   Chronic midline thoracic back pain   Abdominal distention   Leukocytosis   Consultants: PCCM Nephrology Infectious disease Palliative care  medicine  Procedures: Echocardiogram Insertion of double-lumen HD catheter  Antibiotics: Cefepime x1 dose Meropenem 6/29 through 7/2 Vancomycin 6/29 through 6/30 Unasyn 7/2 through 7/15 Unasyn 8/15 >8/22   Time spent: 25 minutes    Erin Hearing ANP  [facility] 7 am - 330 pm/M-F for direct patient care and secure chat Please refer to Amion for contact info 93  days

## 2020-10-12 NOTE — Consult Note (Addendum)
WOC follow-up: [facility] order has expired after today related to 5 days of treatment to chronic Stage 4 sacrum wound for foul odor and drainage.  Topical treatment orders provided for bedside nurses to perform moist gauze packing Q day beginning tomorrow. Please re-consult if further assistance is needed.  Thank-you,  Julien Girt MSN, [place], [place], [place], [place]

## 2020-10-12 NOTE — Progress Notes (Signed)
CSW spoke with patient's wife June who inquired if documentation has been received from [employer] and it has not.   Madilyn Fireman, MSW, LCSW Transitions of Care  Clinical Social Worker II [phone]

## 2020-10-12 NOTE — Progress Notes (Signed)
[facility] Progress Note   69 year old gentleman with C3-C5 compressive myelopathy and subsequent quadriplegia chronically on trach and vent.  Presented from [place] with AMS  hypotension and pneumonia resultant septic shock is admitted to the intensive care unit.  Has had multiple admissions in the past for Klebsiella, MRSA and Pseudomonas urinary tract infections and pneumonia.  His most recent admission 10/03/2020 with multiple drug-resistant Klebsiella and right ureteral calculus requiring double-J stent.  Renal replacement therapy was initiated 07/18/2020.     Underwent successful dialysis 10/05/2020 with 2 L removed.  Assessment/ Plan:   1.Acute kidney Injury-prolonged dialysis dependency, now progressed to ESRD:  ATN from septic shock-without any evidence of renal recovery to date . initiated on CRRT 07/18/20 and transitioned to IHD.  Has [facility] & AVG, has been tolerating hd with AVG  Next HD planned will be Fri + Sat for UF.   If successful with treatments through next week then would remove the TC.    2 Vascular access - had left forearm AVG placed 08/28/2020 but unfortunately had clotted.  s/p thrombectomy and revision 9/2.  - Tolerated  challenge with new cannulation on 9/28. Initial FAL AVG placed by Dr. Stanford Breed on 08/14/2020.   3 Septic shock/ventilator associated pneumonia due to recurrent Acinetobacter infection: Off antibiotics and stable hemodynamics.   4 Leukocytosis -persistent, will defer to primary service for further mgmt   5 Chronic respiratory failure: Status post tracheostomy with ventilator management per CCM.   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   7 Chronic kidney disease/metabolic bone disease: Calcium and phosphorus level under control with hemodialysis, off binders   8 Anemia: contiue ESA, monitor Hb.  Transfuse as needed.   9 Acute metabolic encephalopathy: Continue management as  above.,  Supportive care.   10 Disposition - pt cannot return to [facility] now that he is ESRD. He is going to [facility] in [place], [place]. He needs to have [place] Medicaid & his AVG needs to be mature prior to being accepted. Other possible barrier is pt is a permanent trach & vent pt.    Subjective:   No acute events overnight.   Objective:   BP 135/74   Pulse 86   Temp 98.2 F (36.8 C) (Oral)   Resp 20   Ht 5' 6"  (1.676 m)   Wt 77.1 kg   SpO2 100%   BMI 27.43 kg/m   Intake/Output Summary (Last 24 hours) at 10/12/2020 1004 Last data filed at 10/12/2020 0900 Gross per 24 hour  Intake 675 ml  Output 200 ml  Net 475 ml   Weight change: -1.5 kg  Physical Exam: General:NAD, tracheostomy on vent. Heart: normal rate, s1s2 nl Lungs: trach, no increased work of breathing. Abdomen:soft, Non-tender, PEG tube and ostomy bag Extremities:Trace lower extremities edema Neuro: awake Dialysis Access: RIJ TDC, Lt FAL AVG decent bruit  w/ auscultation w/ bandages in placee  Imaging: No results found.  Labs: BMET Recent Labs  Lab 10/06/20 0429 10/07/20 0731 10/08/20 0606 10/09/20 0654 10/10/20 0357 10/11/20 0430  NA 131* 131* 134* 131* 132* 130*  K 3.1* 3.9 3.3* 4.1 3.6 3.9  CL 93* 92* 94* 92* 95* 93*  CO2 25 23 25 23 24 22   GLUCOSE 136* 187* 148* 142* 156* 158*  BUN 64* 97* 65* 102* 59* 94*  CREATININE 1.17 1.64* 1.22 1.55* 1.07 1.48*  CALCIUM 9.2 9.3 9.3 9.4 9.3 9.6  PHOS  --   --   --  5.5*  --  4.7*   CBC Recent Labs  Lab 10/06/20 0429 10/07/20 1301 10/08/20 0606 10/10/20 0357 10/11/20 0430  WBC 20.9* 17.3* 19.1* 21.1* 21.5*  NEUTROABS 15.6*  --   --   --   --   HGB 9.1* 8.0* 8.5* 8.9* 8.3*  HCT 27.8* 25.1* 25.9* 29.1* 26.7*  MCV 77.7* 77.5* 75.3* 78.4* 76.5*  PLT 449* 462* 464* 512* 487*    Medications:     amiodarone  200 mg Per Tube Daily   apixaban  5 mg Per Tube BID   buPROPion  75 mg Per Tube BID   chlorhexidine gluconate (MEDLINE KIT)  15 mL Mouth Rinse  BID   Chlorhexidine Gluconate Cloth  6 each Topical Q0600   darbepoetin (ARANESP) injection - DIALYSIS  100 mcg Intravenous Q Tue-HD   feeding supplement (PROSource TF)  45 mL Per Tube TID   guaiFENesin  5 mL Per Tube BID   insulin aspart  0-9 Units Subcutaneous Q4H   insulin glargine-yfgn  30 Units Subcutaneous Daily   lidocaine  1 patch Transdermal Q24H   mouth rinse  15 mL Mouth Rinse 10 times per day   metaxalone  800 mg Per Tube TID   midodrine  2.5 mg Per Tube TID WC   multivitamin  1 tablet Per Tube QHS   oxyCODONE  5 mg Per Tube Q4H   pantoprazole sodium  40 mg Per Tube Daily   sertraline  100 mg Per Tube Daily   simethicone  40 mg Per Tube QID   sodium chloride flush  10-40 mL Intracatheter Q12H      Shaune Pollack Rommie Dunn  10/12/2020, 10:04 AM

## 2020-10-13 DIAGNOSIS — J9601 Acute respiratory failure with hypoxia: Secondary | ICD-10-CM | POA: Diagnosis not present

## 2020-10-13 LAB — GLUCOSE, CAPILLARY
Glucose-Capillary: 145 mg/dL — ABNORMAL HIGH (ref 70–99)
Glucose-Capillary: 211 mg/dL — ABNORMAL HIGH (ref 70–99)
Glucose-Capillary: 245 mg/dL — ABNORMAL HIGH (ref 70–99)
Glucose-Capillary: 203 mg/dL — ABNORMAL HIGH (ref 70–99)
Glucose-Capillary: 209 mg/dL — ABNORMAL HIGH (ref 70–99)
Glucose-Capillary: 156 mg/dL — ABNORMAL HIGH (ref 70–99)

## 2020-10-13 MED ORDER — HEPARIN SODIUM (PORCINE) 1000 UNIT/ML IJ SOLN
INTRAMUSCULAR | Status: AC
  Filled 2020-10-13: qty 4

## 2020-10-13 MED ORDER — DARBEPOETIN ALFA 150 MCG/0.3ML IJ SOSY
150.0000 ug | PREFILLED_SYRINGE | INTRAMUSCULAR | Status: DC
  Administered 2020-10-16: 150 ug via INTRAVENOUS
  Filled 2020-10-13: qty 0.3

## 2020-10-13 NOTE — Progress Notes (Signed)
[facility] Progress Note   70 year old gentleman with C3-C5 compressive myelopathy and subsequent quadriplegia chronically on trach and vent.  Presented from [place] with AMS  hypotension and pneumonia resultant septic shock is admitted to the intensive care unit.  Has had multiple admissions in the past for Klebsiella, MRSA and Pseudomonas urinary tract infections and pneumonia.  His most recent admission 10/03/2020 with multiple drug-resistant Klebsiella and right ureteral calculus requiring double-J stent.  Renal replacement therapy was initiated 07/18/2020.     Underwent successful dialysis 10/05/2020 with 2 L removed.  Assessment/ Plan:   1.Acute kidney Injury-prolonged dialysis dependency, now progressed to ESRD:  ATN from septic shock-without any evidence of renal recovery to date . initiated on CRRT 07/18/20 and transitioned to IHD.  Has [facility] & AVG, has been tolerating hd with AVG  Next HD planned will be Sunday vs Monday depending on nursing availability  If successful with treatments through next week then would remove the TC.    2 Vascular access - had left forearm AVG placed 09/07/2020 but unfortunately had clotted.  s/p thrombectomy and revision 9/2.  - Tolerated  challenge with new cannulation on 9/28. Initial FAL AVG placed by Dr. Stanford Breed on 08/21/2020.   3 Septic shock/ventilator associated pneumonia due to recurrent Acinetobacter infection: Off antibiotics and stable hemodynamics.   4 Leukocytosis -persistent, will defer to primary service for further mgmt   5 Chronic respiratory failure: Status post tracheostomy with ventilator management per CCM.   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   7 Chronic kidney disease/metabolic bone disease: Calcium and phosphorus level under control with hemodialysis, off binders. Check PTH   8 Anemia: contiue ESA increased to 124mg starting 10/4, monitor Hb.   Transfuse as needed.    9 Acute metabolic encephalopathy: Continue management as above.,  Supportive care.   10 Disposition - pt cannot return to [facility] now that he is ESRD. [facility] & his AVG needs to be mature prior to being accepted. Other possible barrier is pt is a permanent trach & vent pt.    Subjective:   No acute events overnight.  Underwent dialysis earlier this morning with 1.5 L removed.  Slightly more tachycardic today.   Objective:   BP (!) 144/90   Pulse (!) 110   Temp 98.4 F (36.9 C) (Oral)   Resp 20   Ht _0  (1.676 m)   Wt 77.1 kg   SpO2 97%   BMI 27.43 kg/m   Intake/Output Summary (Last 24 hours) at 10/13/2020 0958 Last data filed at 10/13/2020 0900 Gross per 24 hour  Intake 1035 ml  Output 1700 ml  Net -665 ml   Weight change:   Physical Exam: General:NAD, tracheostomy on vent. Heart: normal rate, s1s2 nl Lungs: trach, no increased work of breathing. Abdomen:soft, Non-tender, PEG tube and ostomy bag Extremities:1+ lower extremities edema Neuro: awake Dialysis Access: RIJ TDC, Lt FAL AVG decent bruit  w/ auscultation w/ bandages in placee  Imaging: No results found.  Labs: BMET Recent Labs  Lab 10/07/20 0731 10/08/20 0606 10/09/20 0654 10/10/20 0357 10/11/20 0430  NA 131* 134* 131* 132* 130*  K 3.9 3.3* 4.1 3.6 3.9  CL 92* 94* 92* 95* 93*  CO2 _1 GLUCOSE 187* 148* 142* 156* 158*  BUN 97* 65* 102* 59* 94*  CREATININE 1.64* 1.22 1.55* 1.07 1.48*  CALCIUM 9.3 9.3 9.4 9.3 9.6  PHOS  --   --  5.5*  --  4.7*   CBC Recent Labs  Lab 10/07/20 1301 10/08/20 0606 10/10/20 0357 10/11/20 0430  WBC 17.3* 19.1* 21.1* 21.5*  HGB 8.0* 8.5* 8.9* 8.3*  HCT 25.1* 25.9* 29.1* 26.7*  MCV 77.5* 75.3* 78.4* 76.5*  PLT 462* 464* 512* 487*    Medications:     amiodarone  200 mg Per Tube Daily   apixaban  5 mg Per Tube BID   buPROPion  75 mg Per Tube BID   chlorhexidine  gluconate (MEDLINE KIT)  15 mL Mouth Rinse BID   Chlorhexidine Gluconate Cloth  6 each Topical Q0600   darbepoetin (ARANESP) injection - DIALYSIS  100 mcg Intravenous Q Tue-HD   feeding supplement (PROSource TF)  45 mL Per Tube TID   guaiFENesin  5 mL Per Tube BID   heparin sodium (porcine)       insulin aspart  0-9 Units Subcutaneous Q4H   insulin glargine-yfgn  30 Units Subcutaneous Daily   lidocaine  1 patch Transdermal Q24H   mouth rinse  15 mL Mouth Rinse 10 times per day   metaxalone  800 mg Per Tube TID   multivitamin  1 tablet Per Tube QHS   oxyCODONE  5 mg Per Tube Q4H   pantoprazole sodium  40 mg Per Tube Daily   sertraline  100 mg Per Tube Daily   simethicone  40 mg Per Tube QID   sodium chloride flush  10-40 mL Intracatheter Q12H      Reesa Chew  10/13/2020, 9:58 AM

## 2020-10-13 NOTE — Progress Notes (Signed)
Patient seen and examined.  I examined patient and discussed case with critical care, bedside nursing staff.  Patient does not respond.  He looks at you but no interaction.  No overnight events.  Received successful dialysis today.  Plan: Multiple medical issues that are stable today. Disposition pending. Continue current level of care.  PCCM managing ventilator.   Total time spent: 15 minutes.

## 2020-10-13 DEATH — deceased

## 2020-10-14 DIAGNOSIS — J9601 Acute respiratory failure with hypoxia: Secondary | ICD-10-CM | POA: Diagnosis not present

## 2020-10-14 LAB — GLUCOSE, CAPILLARY
Glucose-Capillary: 103 mg/dL — ABNORMAL HIGH (ref 70–99)
Glucose-Capillary: 117 mg/dL — ABNORMAL HIGH (ref 70–99)
Glucose-Capillary: 120 mg/dL — ABNORMAL HIGH (ref 70–99)
Glucose-Capillary: 122 mg/dL — ABNORMAL HIGH (ref 70–99)
Glucose-Capillary: 136 mg/dL — ABNORMAL HIGH (ref 70–99)
Glucose-Capillary: 72 mg/dL (ref 70–99)

## 2020-10-14 NOTE — Progress Notes (Signed)
Patient seen and examined.  Overnight he was taken back from dialysis with reported low blood pressures.  But no change in patient's clinical status.  Nephrology decided to put him on a scheduled TTS dialysis with his new matured AV graft.   No other events. Difficult disposition.  Waiting for SNF.  Total time spent: 15 minutes

## 2020-10-14 NOTE — Progress Notes (Signed)
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  Presented from [facility] with AMS  hypotension and pneumonia resultant septic shock is admitted to the intensive care unit.  Has had multiple admissions in the past for Klebsiella, MRSA and Pseudomonas urinary tract infections and pneumonia.  His most recent admission 10/03/2020 with multiple drug-resistant Klebsiella and right ureteral calculus requiring double-J stent.  Renal replacement therapy was initiated 07/18/2020.     Underwent successful dialysis 10/05/2020 with 2 L removed.  Assessment/ Plan:   1.Acute kidney Injury-prolonged dialysis dependency, now progressed to ESRD:  ATN from septic shock-without any evidence of renal recovery to date . initiated on CRRT 07/18/20 and transitioned to IHD.  Has TDC & AVG, has been tolerating hd with AVG  He has had some volume overload but this does not seem to be volume that we can easily removed with dialysis.  Therefore no longer planning for extra session of dialysis.  We will resume dialysis on Tuesday.  If successful with treatment using AVG on Tuesday would remove the TC.    2 Vascular access - had left forearm AVG placed 08/25/2020 but unfortunately had clotted.  s/p thrombectomy and revision 9/2.  - Tolerated  challenge with new cannulation on 9/28. Initial FAL AVG placed by Dr. Stanford Breed on 09/01/2020.   3 Septic shock/ventilator associated pneumonia due to recurrent Acinetobacter infection: Off antibiotics and stable hemodynamics.   4 Leukocytosis -persistent, will defer to primary service for further mgmt   5 Chronic respiratory failure: Status post tracheostomy with ventilator management per CCM.   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   7 Chronic kidney disease/metabolic bone disease: Calcium and phosphorus level under  control with hemodialysis, off binders. Check PTH   8 Anemia: contiue ESA increased to 133mg starting 10/4, monitor Hb.  Transfuse as needed.    9 Acute metabolic encephalopathy: Continue management as above.,  Supportive care.   10 Disposition - pt cannot return to [facility] now that he is ESRD. [facility] [place] [facility] [place] [facility] [place] & his AVG needs to be mature prior to being accepted. Other possible barrier is pt is a permanent trach & vent pt.    Subjective:   No acute events overnight.  Blood pressure has been borderline low.  No other changes   Objective:   BP (!) 93/53   Pulse 94   Temp 99 F (37.2 C) (Oral)   Resp 20   Ht _0  (1.676 m)   Wt 81.2 kg   SpO2 94%   BMI 28.89 kg/m   Intake/Output Summary (Last 24 hours) at 10/14/2020 0824 Last data filed at 10/14/2020 0700 Gross per 24 hour  Intake 1225 ml  Output 275 ml  Net 950 ml   Weight change:   Physical Exam: General:NAD, tracheostomy on vent. Heart: normal rate, s1s2 nl Lungs: trach, no increased work of breathing. Abdomen:soft, Non-tender, PEG tube and ostomy bag Extremities: Trace lower extremities edema Neuro: awake Dialysis Access: RIJ TDC, Lt FAL AVG decent bruit  w/ auscultation w/ bandages in placee  Imaging: No results found.  Labs: BMET Recent Labs  Lab 10/08/20 0606 10/09/20 0654 10/10/20 0357 10/11/20 0430  NA 134* 131* 132* 130*  K 3.3* 4.1 3.6 3.9  CL 94* 92* 95* 93*  CO2 _1 GLUCOSE 148* 142* 156* 158*  BUN 65* 102* 59* 94*  CREATININE 1.22 1.55* 1.07 1.48*  CALCIUM 9.3 9.4 9.3 9.6  PHOS  --  5.5*  --  4.7*   CBC Recent Labs  Lab 10/07/20 1301 10/08/20 0606 10/10/20 0357 10/11/20 0430  WBC 17.3* 19.1* 21.1* 21.5*  HGB 8.0* 8.5* 8.9* 8.3*  HCT 25.1* 25.9* 29.1* 26.7*  MCV 77.5* 75.3* 78.4* 76.5*  PLT 462* 464* 512* 487*    Medications:     amiodarone  200 mg Per Tube Daily   apixaban  5 mg Per Tube BID   buPROPion  75 mg  Per Tube BID   chlorhexidine gluconate (MEDLINE KIT)  15 mL Mouth Rinse BID   Chlorhexidine Gluconate Cloth  6 each Topical Q0600   [START ON 10/16/2020] darbepoetin (ARANESP) injection - DIALYSIS  150 mcg Intravenous Q Tue-HD   feeding supplement (PROSource TF)  45 mL Per Tube TID   guaiFENesin  5 mL Per Tube BID   heparin sodium (porcine)       insulin aspart  0-9 Units Subcutaneous Q4H   insulin glargine-yfgn  30 Units Subcutaneous Daily   lidocaine  1 patch Transdermal Q24H   mouth rinse  15 mL Mouth Rinse 10 times per day   metaxalone  800 mg Per Tube TID   multivitamin  1 tablet Per Tube QHS   oxyCODONE  5 mg Per Tube Q4H   pantoprazole sodium  40 mg Per Tube Daily   sertraline  100 mg Per Tube Daily   simethicone  40 mg Per Tube QID   sodium chloride flush  10-40 mL Intracatheter Q12H      Reesa Chew  10/14/2020, 8:24 AM

## 2020-10-14 NOTE — Progress Notes (Signed)
Does not receive HD tx  as scheduled on 10/13/20 due to critical vital signs. MD on call (Dr. Joylene Grapes) notified.

## 2020-10-15 DIAGNOSIS — J9601 Acute respiratory failure with hypoxia: Secondary | ICD-10-CM | POA: Diagnosis not present

## 2020-10-15 LAB — GLUCOSE, CAPILLARY
Glucose-Capillary: 112 mg/dL — ABNORMAL HIGH (ref 70–99)
Glucose-Capillary: 116 mg/dL — ABNORMAL HIGH (ref 70–99)
Glucose-Capillary: 118 mg/dL — ABNORMAL HIGH (ref 70–99)
Glucose-Capillary: 118 mg/dL — ABNORMAL HIGH (ref 70–99)
Glucose-Capillary: 127 mg/dL — ABNORMAL HIGH (ref 70–99)
Glucose-Capillary: 139 mg/dL — ABNORMAL HIGH (ref 70–99)

## 2020-10-15 LAB — RENAL FUNCTION PANEL
Albumin: 1.7 g/dL — ABNORMAL LOW (ref 3.5–5.0)
Anion gap: 18 — ABNORMAL HIGH (ref 5–15)
BUN: 108 mg/dL — ABNORMAL HIGH (ref 8–23)
CO2: 22 mmol/L (ref 22–32)
Calcium: 9.6 mg/dL (ref 8.9–10.3)
Chloride: 91 mmol/L — ABNORMAL LOW (ref 98–111)
Creatinine, Ser: 1.62 mg/dL — ABNORMAL HIGH (ref 0.61–1.24)
GFR, Estimated: 45 mL/min — ABNORMAL LOW (ref >60)
Glucose, Bld: 136 mg/dL — ABNORMAL HIGH (ref 70–99)
Phosphorus: 6 mg/dL — ABNORMAL HIGH (ref 2.5–4.6)
Potassium: 4.9 mmol/L (ref 3.5–5.1)
Sodium: 131 mmol/L — ABNORMAL LOW (ref 135–145)

## 2020-10-15 LAB — PARATHYROID HORMONE, INTACT (NO CA): PTH: 26 pg/mL (ref 15–65)

## 2020-10-15 MED ORDER — CINACALCET HCL 30 MG PO TABS: 30.0000 mg | ORAL_TABLET | Freq: Every day | ORAL | Status: DC

## 2020-10-15 NOTE — Progress Notes (Signed)
NAME:  Luis Orr, MRN:  [id number], DOB:  1950/03/20, LOS: 96 ADMISSION DATE:  06/20/2020, CONSULTATION DATE:  07/02/2020 REFERRING MD:  Dr Francia Greaves - EDP CHIEF COMPLAINT:  Septic shock    History of Present Illness:  70 year old man from [facility] with history of C3-C5 compressive myelopathy with resultant functional quadriplegia and chronic trach/vent who presented to ED with altered mental status and hypotension with recurrent HCAP.  Has multiple recent admissions for sepsis secondary to various infections including UTIs, pneumonias and bacteremia in setting of MDR Klebsiella, MRSA and pseudomonas. Most recent admission September 2021 in setting of sepsis due to MDR Klebsiella with right ureteral calculus, s/p double J ureteral stent placement and treated with meropenem x 10 days.  PCCM following for chronic tracheostomy/vent management.  Pertinent Medical History:  Compressive Myelopathy with Functional Quadriplegia  Chronic Respiratory Failure s/p Trach with Vent Dependence  Anemia  DM II  HTN  Urinary Retention with chronic foley  Renal Calculi  Frequent PNA Chronic [facility] Resident   Significant Hospital Events:  6/29 admitted to ICU for septic shock on levo to maintain MAP>65, continued on vent support  6/29 Blood Cx>> staph species in 1 of 4 cultures drawn>> suspect contaminant 6/30 Off pressors 7/2 ID Consult for MDR acinetobacter in trach asp, Meropenem changed to unasyn 7/2 PRBC transfusion, iron replacement  7/5 Pressors added again. Central line placed. Drainage from gastrostomy tube >> CT Abd without fluid collection around gastrostomy tube but with diffuse body wall edema. Echo EF 50-55%, mild LVH  7/6 Renal Replacement Therapy ; Code status changed to partial Code (DNR) 7/7 Transfuse 1U PRBC 7/9 add solu cortef 7/10 off pressors, weaning stress steroids  7/11 Pressors added back on again 7/12 ID consult due to rising WBC, hypothermia, hypotension concern for  worsening infection. Vancomycin added. Repeat blood cx collected. 7/15 CRRT stopped  7/17 Restarted on low dose levo. No evidence of new infection. Held off on antibiotics. Nephrology contacted family and decision was made to continue iHD this week to allow additional time for renal recovery. Patient is not a long term iHD candidate  7/18 plans for iHD, tolerated with 2L removed  7/19 slight drop in hgb to 6.9 will transfuse 1 unit PRBC  7/20 To [facility], PCCM following for trach / vent needs 7/22>> tolerating HD, will need out of state placement 8/4 last PCCM visit. 8/4 -8/8: No issues. CM workin gon out of state placement  8/15 no acute changes. Continues on PRVC, was apneic with PSV attempt. Did wean to 40% FiO2  8/29 Oral bleeding from biting lip/tongue, subsequent bloody trach secretions  8/30 Left FA AVG occlueded 9/2 s/p thrombectomy and revision of LUE AVG 9/4 LUE with neg venous dupplex 9/5 heparin stopped, resumed Eliquis  9/6 iHD with 2L UF 9/7 changed from [facility] to PCV given high plat pressures 9/10 remains on the ventilator, unable to wean 9/19 unchanged 9/26 Stable on PCV, increased clear secretions per RN 10/3 stable on PCV   Interim History / Subjective:  NAEO  9/28 AVG used successfully   LUE very swollen today    Objective:  Blood pressure 131/75, pulse 92, temperature 98.7 F (37.1 C), temperature source Axillary, resp. rate 19, height '5\' 6"'$  (1.676 m), weight 77.3 kg, SpO2 96 %.    Vent Mode: PCV FiO2 (%):  [30 %] 30 % Set Rate:  [20 bmp] 20 bmp PEEP:  [5 cmH20] 5 cmH20 Plateau Pressure:  [26 cmH20-28 cmH20] 26 cmH20  Intake/Output Summary (Last 24 hours) at 10/15/2020 1314 Last data filed at 10/15/2020 0900 Gross per 24 hour  Intake 745 ml  Output 125 ml  Net 620 ml   Filed Weights   10/11/20 0735 10/14/20 0334 10/15/20 0500  Weight: 77.1 kg 81.2 kg 77.3 kg   Physical Examination: General:Chronically ill older adult M reclined in bed NAD  HEENT: NCAT  Trach secure.  Neuro: Functional quadriplegia. Awake, Alert. Does not interact. Repetitive chewing motion.  CV: rrr cap refill < 3sec  PULM: Mechanically ventilated. Symmetrical chest expansion. Clear oral secretions  GI: LLQ colostomy. Soft ndnt  Extremities:LUE very edematous. BLE edema . Skin: c/d/w no rash   Resolved Hospital Problem List:  Hyperkalemia Tachycardia Hypotension Thrombocytosis  AKI Septic shock 2nd to HCAP and UTI all present on admission Acinetobacter in sputum culture from 07/12/20. Completed 14 days of Unasyn for -Of pressors on 4/14. Restarted  levo 7/17. Acute on chronic respiratory failure   Assessment & Plan:    C3-C5 compressive myelopathy Chronic respiratory failure Trach / Vent dependence Acinetobacter Baumannii PNA Antibiotic course completed 8/22.  Probable component of pulm edema  P - Continue vent support (8-10 cc/kg IBW) - Continue PCV, tolerating with better Pplat - Wean FiO2 for O2 sat > 90% -Routine trach care -VAP, pulm hygiene -volume removal per HD  ESRD -per nephrology  -Volume removal (AVG vs HD cath) as tolerated with HD  LUE edema -agree with nephro, if edema doesn't improve would eval. Difficult from my vantage point to know how this has progressed since CCM only is following weekly for trach   Remains partial code (drugs, vent only) in the event of decompensation  PCCM will continue to follow weekly. Please do not hesitate to call sooner if questions/problems arise.   Cct: n/a    Eliseo Gum MSN, AGACNP-BC [place] for Pager  10/15/2020, 1:51 PM

## 2020-10-15 NOTE — Progress Notes (Signed)
[facility] PROGRESS NOTE  Luis Orr HEN:[id number] DOB: 1950/05/26 DOA: 07/01/2020 PCP: Townsend Roger, MD  Status: Remains inpatient appropriate because:Hemodynamically unstable, Persistent severe electrolyte disturbances, Unsafe d/c plan, and Inpatient level of care appropriate due to severity of illness  Dispo: The patient is from: SNF[facility] vent capable              Anticipated d/c is to: SNF Vent capable w/ access to in facility/stretcher based HD-a facility in [place] has been located in preauthorization in progress.              Patient currently is not medically stable to d/c.   Difficult to place patient Yes    Level of care: ICU  Code Status: Partial (no CPR and no defibrillation or cardioversion) Family Communication: Wife June at bedside on 9/21 DVT prophylaxis: IV Heparin COVID vaccination status: Unknown    HPI:  70 y.o. 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 Patient presented secondary to altered mental status and hypotension with recurrent pneumonia requiring vasopressor support and ICU admission.he has had multiple recent admissions for sepsis secondary to various infections including UTIs, pneumonias and bacteremia in setting of MDR Klebsiella, MRSA and pseudomonas. Most recent admission September 2021 in setting of sepsis due to MDR Klebsiella with right ureteral calculus s/p double J ureteral stent placement and treated with meropenem x 10days. He was discharged back to [facility].  ICU significant events: 6/29 admitted to ICU for septic shock on levo to maintain MAP>65, continued on vent support 6/29 Blood Cx>> staph species in 1 of 4 cultures drawn>> suspect contaminant 6/30 Off pressors 7/2 ID Consult for MDR acinetobacter in trach asp, Meropenem changed to unasyn 7/2 PRBC transfusion, iron replacement 7/5 Pressors added again. Central line placed. Drainage from gastrostomy  tube >> CT Abd without fluid collection around gastrostomy tube but with diffuse body wall edema. Echo EF 50-55%, mild LVH 7/6 Renal Replacement Therapy ; Code status changed to partial Code (DNR) 7/7 Transfuse 1U PRBC 7/9 add solu cortef 7/10 off pressors, weaning stress steroids 7/11 Pressors added back on again 7/12 ID consult due to rising WBC, hypothermia, hypotension concern for worsening infection. Vancomycin added. Repeat blood cx collected. 7/15 CRRT stopped 7/17 Restarted on low dose levo. No evidence of new infection. Held off on antibiotics. Nephrology contacted family and decision was made to continue iHD this week to allow additional time for renal recovery. Patient is not the best long term iHD candidate but family wants aggressive care 7/18 plans for iHD, tolerated with 2L removed 719 slight drop in hgb to 6.9 will transfuse 1 unit PRBC 7/22 > 7/26: Tolerating HD. TOC consulted for placement. Will likely need out of state placement. 8/15 WBCs greater than 28,000, hypoglycemia, procalcitonin >18, chest x-ray consistent with pneumonia, recent blood cultures consistent with contamination.  Unasyn IV resumed after sputum culture obtained 8/16 consultation placed with ethics committee regarding evaluation for futility of care 8/16 Sputum cx + for Acinetobacter with sensitivities pending 8/17 extensive conversation held with patient's wife regarding patient's poor prognosis and reemergence of Acinetobacter VAP.  Also discussed with her that patient has been able to convey to multiple staff that he does not want to continue on the ventilator or with dialysis.  Wife reports that the patient does not understand what he is saying, she is the POA and that she wants to continue aggressive care. 8/18 patient now telling nephrology, myself and the rest  of the staff he replies yes to wanting to remain on dialysis and on the ventilator and this direct question is asked. 8/18 Acinetobacter sensitive  to Unasyn only.  Discussed with ID/Dr. Juleen China.  Plan is to administer antibiotics for 7-day since patient has not had any increased O2 needs and remains afebrile.  If any concerns over worsening symptoms can give an additional 3 days. Developed moderate bleeding around tracheostomy site on 8/19.  Likely related to combination of Eliquis and heparin required for CRRT.  We will hold Eliquis for now and monitor degree of bleeding Eliquis resumed on 8/22.  Overnight into 8/23 patient bit his tongue and had bleeding.  Also had some bleeding around trach likely from oral bleeding.  Also though did have bleeding from old central line site. 8/23 had transient bleeding around trach and prior central line insertion site overnight.  Patient had bitten tongue and this likely explain the bleeding.  This morning patient was not having any bleeding.  On 8/25 had redeveloped slow ooze bleeding from mouth and trach.  Eliquis discontinued in favor of IV heparin especially given patient will undergo surgical procedure for vascular access on 8/29 8/30 Left FA AVG occluded 9/2 status post thrombectomy and revision of LUE AV graft 9/4 LUE edema with negative venous duplex. 9/5 heparin discontinued and Eliquis resumed    Subjective: Continues with flat, depressed affect and remains noninteractive during my encounters with him.  Objective: Vitals:   10/15/20 0600 10/15/20 0700  BP: 110/69 116/78  Pulse:  91  Resp: 20 20  Temp:    SpO2:  100%    Intake/Output Summary (Last 24 hours) at 10/15/2020 0728 Last data filed at 10/15/2020 0700 Gross per 24 hour  Intake 730 ml  Output 25 ml  Net 705 ml   Filed Weights   10/11/20 0735 10/14/20 0334 10/15/20 0500  Weight: 77.1 kg 81.2 kg 77.3 kg    Exam:  Constitutional: Awake, flat and depressed affect Respiratory: Trach: 6.0 XLT cuffed, PRVC mode, FiO2 40% -rate 20-PEEP 5; anterior lung sounds coarse but clear to auscultation. Cardiovascular: Sinus rhythm   continues with significant stable edema LUE primarily of the hand and forearm -normotensive -AVG palpable thrill.  Some hypotension with dialysis requiring midodrine Abdomen: LBM 10/03, PEG tube,  Left lower quadrant colostomy with stool, abdomen nontender with normoactive bowel sounds.  Neurologic: Chronic quadriplegia. No sensation below shoulders. Psychiatric: Awake, and flat affect.  Appears depressed.   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 - ventilator management per PCCM Failed recent SBT secondary to apnea  Recurrent ventilator associated pneumonia 2/2 recurrent Acinetobacter/sepsis without shock Has been treated x2 this admission for Acinetobacter tracheobronchitis/pneumonia    AKI with oliguria/volume overload/ New CKD requiring HD Anuric-Nephrology following, initially required CRRT and has transitioned to HD (LUE AVG functional-revised 9/2 but needs to mature x 4 weeks before can dc Nephrology documents volume overload that has not been easily removed with dialysis therefore extra dialysis sessions stopped in favor of TTS schedule. If able to successfully use AVG with Tuesday's treatment plan is to remove [facility]   Septic shock 2/2 VAP POA resolved  Chronic thoracic back pain Continue scheduled low-dose Oxy IR along, IV morphine prn, Skelaxin and lidocaine patch   History of paroxysmal atrial fibrillation Continue amiodarone and Eliquis 9/30 QTC 385 ms   Acute metabolic encephalopathy Resolved  Diabetes mellitus, type 2 Continue Semglee 30 units daily.  Continue SSI  w/ CBG checks every 4 hours  Acute diverticulitis Resolved after treatment with Flagyl  Nodular irregular thickening of the ascending colon-?  [facility] consulted.  They discussed with his wife.  Given patient's debilitated state/diverticulitis they recommended holding off on colonoscopy for now.  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  Plan is to discharge to facility in [place]   Microcytic anemia/Acute anemia superimposed on anemia of chronic kidney disease S/P 6 units of PRBC -Continue Aranesp; Hb average between 8 and 9   Severe protein calorie malnutrition Continue tube feedings as recommended by nutrition/registered dietitian  Pressure injury/stage IV sacral decubitus Stage IV medial/posterior sacrum present on admission.  Right/posterior/lateral ankle unknown if present on admission.  DTI left ear, not present on admission. (For additional documentation see wound care notes) Patient recently underwent bedside debridement of sacral and heel wound.  No evidence of infection found.  Wounds now with red granular base.  Continue wound packing as prior.    Data Reviewed: Basic Metabolic Panel: Recent Labs  Lab 10/09/20 0654 10/10/20 0357 10/11/20 0430 10/15/20 0500  NA 131* 132* 130* 131*  K 4.1 3.6 3.9 4.9  CL 92* 95* 93* 91*  CO2 $Re'23 24 22 22  'TUM$ GLUCOSE 142* 156* 158* 136*  BUN 102* 59* 94* 108*  CREATININE 1.55* 1.07 1.48* 1.62*  CALCIUM 9.4 9.3 9.6 9.6  PHOS 5.5*  --  4.7* 6.0*   Liver Function Tests: Recent Labs  Lab 10/09/20 0654 10/11/20 0430 10/15/20 0500  ALBUMIN 1.7* 1.7* 1.7*    CBC: Recent Labs  Lab 10/10/20 0357 10/11/20 0430  WBC 21.1* 21.5*  HGB 8.9* 8.3*  HCT 29.1* 26.7*  MCV 78.4* 76.5*  PLT 512* 487*   CBG: Recent Labs  Lab 10/14/20 1113 10/14/20 1514 10/14/20 1935 10/14/20 2358 10/15/20 0310  GLUCAP 72 117* 103* 120* 118*      Scheduled Meds:  amiodarone  200 mg Per Tube Daily   apixaban  5 mg Per Tube BID   buPROPion  75 mg Per Tube BID    chlorhexidine gluconate (MEDLINE KIT)  15 mL Mouth Rinse BID   Chlorhexidine Gluconate Cloth  6 each Topical Q0600   [START ON 10/16/2020] darbepoetin (ARANESP) injection - DIALYSIS  150 mcg Intravenous Q Tue-HD   feeding supplement (PROSource TF)  45 mL Per Tube TID   guaiFENesin  5 mL Per Tube BID   insulin aspart  0-9 Units Subcutaneous Q4H   insulin glargine-yfgn  30 Units Subcutaneous Daily   lidocaine  1 patch Transdermal Q24H   mouth rinse  15 mL Mouth Rinse 10 times per day   metaxalone  800 mg Per Tube TID   multivitamin  1 tablet Per Tube QHS   oxyCODONE  5 mg Per Tube Q4H   pantoprazole sodium  40 mg Per Tube Daily   sertraline  100 mg Per Tube Daily   simethicone  40 mg Per Tube QID   sodium chloride flush  10-40 mL Intracatheter Q12H   Continuous Infusions:  sodium chloride 10 mL/hr at 10/02/2020 1429   sodium chloride 500 mL (09/01/20 1408)   sodium chloride     sodium chloride     feeding supplement (NEPRO CARB STEADY) 1,000  mL (10/15/20 0044)    Principal Problem:   Acute respiratory failure with hypoxia (HCC) Active Problems:   Ventilator dependent (HCC)   Sacral decubitus ulcer   Septic shock (HCC)   Pressure injury of skin   AKI (acute kidney injury) ([place])   Hypotension   Goals of care, counseling/discussion   Quadriplegia ([place])   Chronic kidney disease requiring chronic dialysis ([place])   Hyperglycemia   Sepsis due to Acinetobacter baumannii ([place])   Pneumonia due to Acinetobacter species ([place])   Dysautonomia ([place])   Chronic midline thoracic back pain   Abdominal distention   Leukocytosis   Consultants: PCCM Nephrology Infectious disease Palliative care medicine  Procedures: Echocardiogram Insertion of double-lumen HD catheter  Antibiotics: Cefepime x1 dose Meropenem 6/29 through 7/2 Vancomycin 6/29 through 6/30 Unasyn 7/2 through 7/15 Unasyn 8/15 >8/22   Time spent: 25 minutes    Erin Hearing ANP  [facility] 7 am - 330  pm/M-F for direct patient care and secure chat Please refer to Amion for contact info 96  days

## 2020-10-15 NOTE — Progress Notes (Signed)
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  Presented from [place] with AMS  hypotension and pneumonia resultant septic shock is admitted to the intensive care unit.  Has had multiple admissions in the past for Klebsiella, MRSA and Pseudomonas urinary tract infections and pneumonia.  His most recent admission 10/03/2020 with multiple drug-resistant Klebsiella and right ureteral calculus requiring double-J stent.  Renal replacement therapy was initiated 07/18/2020.     Underwent successful dialysis 10/05/2020 with 2 L removed.  Assessment/ Plan:   1.Acute kidney Injury-prolonged dialysis dependency, now progressed to ESRD:  ATN from septic shock-without any evidence of renal recovery to date . initiated on CRRT 07/18/20 and transitioned to IHD.  Has TDC & AVG, has been tolerating hd with AVG.  BP limited extra HD on 10/2.  Volume appears to mainly be LUE with AVG and no extra treatment needed today.    2 Vascular access - had left forearm AVG placed 09/05/2020 but unfortunately had clotted.  s/p thrombectomy and revision 9/2. Tolerated  new cannulation on 9/28. Plan to use it again 10/4 for HD.  May need eval of LUE edema if continues.   3 Septic shock/ventilator associated pneumonia due to recurrent Acinetobacter infection: Off antibiotics and stable hemodynamics.   4 Leukocytosis -persistent, will defer to primary service for further mgmt   5 Chronic respiratory failure: Status post tracheostomy with ventilator management per CCM.   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   7 Chronic kidney disease/metabolic bone disease: Calcium and phosphorus level under control with hemodialysis, off binders. Check PTH - pending from 10/1   8 Anemia: contiue ESA increased to 175mg starting 10/4, monitor Hb.  Transfuse as needed.    9  Acute metabolic encephalopathy: Continue management as above.,  Supportive care.   10 Disposition - pt cannot return to [facility] now that he is ESRD. [place] [facility] [place] & his AVG needs to be mature prior to being accepted. Other possible barrier is pt is a permanent trach & vent pt.    Subjective:   No acute events overnight.  Blood pressure limited HD yesterday - extra treatment for volume was abnorted.  No other changes   Objective:   BP 116/78   Pulse 91   Temp 98.7 F (37.1 C) (Axillary)   Resp 20   Ht 5' 6" (1.676 m)   Wt 77.3 kg   SpO2 100%   BMI 27.51 kg/m   Intake/Output Summary (Last 24 hours) at 10/15/2020 0920 Last data filed at 10/15/2020 0700 Gross per 24 hour  Intake 730 ml  Output 25 ml  Net 705 ml    Weight change: -3.9 kg  Physical Exam: General:NAD, tracheostomy on vent. Heart: normal rate, s1s2 nl Lungs: trach, no increased work of breathing. Abdomen:soft, Non-tender, PEG tube and ostomy bag Extremities: Trace lower extremities edema Neuro: awake Dialysis Access: RIJ TDC, Lt FAL AVG decent bruit but 2+ pitting edema noted of L arm.  Has L sided power line in L chest.  Sutures intact.  Imaging: No results found.  Labs: BMET Recent Labs  Lab 10/09/20 0654 10/10/20 0357 10/11/20 0430 10/15/20 0500  NA 131* 132* 130* 131*  K 4.1 3.6 3.9 4.9  CL 92* 95* 93* 91*  CO2 _0 GLUCOSE 142* 156* 158* 136*  BUN  102* 59* 94* 108*  CREATININE 1.55* 1.07 1.48* 1.62*  CALCIUM 9.4 9.3 9.6 9.6  PHOS 5.5*  --  4.7* 6.0*    CBC Recent Labs  Lab 10/10/20 0357 10/11/20 0430  WBC 21.1* 21.5*  HGB 8.9* 8.3*  HCT 29.1* 26.7*  MCV 78.4* 76.5*  PLT 512* 487*     Medications:     amiodarone  200 mg Per Tube Daily   apixaban  5 mg Per Tube BID   buPROPion  75 mg Per Tube BID   chlorhexidine gluconate (MEDLINE KIT)  15 mL Mouth Rinse BID   Chlorhexidine Gluconate Cloth  6 each Topical Q0600    [START ON 10/16/2020] darbepoetin (ARANESP) injection - DIALYSIS  150 mcg Intravenous Q Tue-HD   feeding supplement (PROSource TF)  45 mL Per Tube TID   guaiFENesin  5 mL Per Tube BID   insulin aspart  0-9 Units Subcutaneous Q4H   insulin glargine-yfgn  30 Units Subcutaneous Daily   lidocaine  1 patch Transdermal Q24H   mouth rinse  15 mL Mouth Rinse 10 times per day   metaxalone  800 mg Per Tube TID   multivitamin  1 tablet Per Tube QHS   oxyCODONE  5 mg Per Tube Q4H   pantoprazole sodium  40 mg Per Tube Daily   sertraline  100 mg Per Tube Daily   simethicone  40 mg Per Tube QID   sodium chloride flush  10-40 mL Intracatheter Q12H      Justin Mend  10/15/2020, 9:20 AM

## 2020-10-16 ENCOUNTER — Inpatient Hospital Stay (HOSPITAL_COMMUNITY): Admit: 2020-10-16 | Payer: No Typology Code available for payment source

## 2020-10-16 DIAGNOSIS — M109 Gout, unspecified: Secondary | ICD-10-CM | POA: Insufficient documentation

## 2020-10-16 DIAGNOSIS — R3129 Other microscopic hematuria: Secondary | ICD-10-CM | POA: Insufficient documentation

## 2020-10-16 DIAGNOSIS — N529 Male erectile dysfunction, unspecified: Secondary | ICD-10-CM | POA: Insufficient documentation

## 2020-10-16 DIAGNOSIS — R221 Localized swelling, mass and lump, neck: Secondary | ICD-10-CM | POA: Insufficient documentation

## 2020-10-16 DIAGNOSIS — R531 Weakness: Secondary | ICD-10-CM | POA: Insufficient documentation

## 2020-10-16 DIAGNOSIS — R220 Localized swelling, mass and lump, head: Secondary | ICD-10-CM | POA: Insufficient documentation

## 2020-10-16 DIAGNOSIS — J9601 Acute respiratory failure with hypoxia: Secondary | ICD-10-CM | POA: Diagnosis not present

## 2020-10-16 DIAGNOSIS — T6391XA Toxic effect of contact with unspecified venomous animal, accidental (unintentional), initial encounter: Secondary | ICD-10-CM | POA: Insufficient documentation

## 2020-10-16 DIAGNOSIS — R6889 Other general symptoms and signs: Secondary | ICD-10-CM | POA: Insufficient documentation

## 2020-10-16 DIAGNOSIS — E785 Hyperlipidemia, unspecified: Secondary | ICD-10-CM | POA: Insufficient documentation

## 2020-10-16 DIAGNOSIS — N183 Chronic kidney disease, stage 3 unspecified: Secondary | ICD-10-CM | POA: Insufficient documentation

## 2020-10-16 DIAGNOSIS — F419 Anxiety disorder, unspecified: Secondary | ICD-10-CM | POA: Insufficient documentation

## 2020-10-16 LAB — GLUCOSE, CAPILLARY
Glucose-Capillary: 112 mg/dL — ABNORMAL HIGH (ref 70–99)
Glucose-Capillary: 118 mg/dL — ABNORMAL HIGH (ref 70–99)
Glucose-Capillary: 120 mg/dL — ABNORMAL HIGH (ref 70–99)
Glucose-Capillary: 125 mg/dL — ABNORMAL HIGH (ref 70–99)
Glucose-Capillary: 133 mg/dL — ABNORMAL HIGH (ref 70–99)
Glucose-Capillary: 71 mg/dL (ref 70–99)

## 2020-10-16 MED ORDER — MIDODRINE HCL 5 MG PO TABS
10.0000 mg | ORAL_TABLET | Freq: Every day | ORAL | Status: DC | PRN
  Administered 2020-10-16: 10 mg
  Filled 2020-10-16 (×3): qty 2

## 2020-10-16 MED ORDER — ALBUMIN HUMAN 25 % IV SOLN
25.0000 g | Freq: Once | INTRAVENOUS | Status: AC
  Administered 2020-10-16: 25 g via INTRAVENOUS
  Filled 2020-10-16: qty 100

## 2020-10-16 NOTE — Progress Notes (Signed)
Assisted tele visit to patient with wife.  Emmaline Wahba D Mailee Klaas, RN   

## 2020-10-16 NOTE — Progress Notes (Deleted)
POST OPERATIVE OFFICE NOTE    CC:  F/u for surgery  HPI:  This is a 70 y.o. male who is s/p *** on *** by Dr. Marland Kitchen    Pt returns today for follow up.  Pt states ***  Allergies  Allergen Reactions   Atenolol     Other reaction(s): Weakness present   Fosinopril Cough   Plendil [Felodipine]    Adhesive [Tape] Rash    No current facility-administered medications for this visit.   No current outpatient medications on file.   Facility-Administered Medications Ordered in Other Visits  Medication Dose Route Frequency Provider Last Rate Last Admin   0.9 %  sodium chloride infusion  250 mL Intravenous Continuous Ulyses Amor, PA-C 10 mL/hr at 10/01/2020 1429 Continued from Pre-op at 09/17/2020 1429   0.9 %  sodium chloride infusion   Intravenous PRN Ulyses Amor, PA-C 10 mL/hr at 09/01/20 1408 500 mL at 09/01/20 1408   0.9 %  sodium chloride infusion  100 mL Intravenous PRN Edrick Oh, MD       0.9 %  sodium chloride infusion  100 mL Intravenous PRN Edrick Oh, MD       alteplase (CATHFLO ACTIVASE) injection 2 mg  2 mg Intracatheter Once PRN Edrick Oh, MD       amiodarone (PACERONE) tablet 200 mg  200 mg Per Tube Daily Laurence Slate M, PA-C   200 mg at 10/16/20 8366   apixaban (ELIQUIS) tablet 5 mg  5 mg Per Tube BID Samella Parr, NP   5 mg at 10/16/20 0901   buPROPion (WELLBUTRIN) tablet 75 mg  75 mg Per Tube BID Ulyses Amor, PA-C   75 mg at 10/16/20 0902   chlorhexidine gluconate (MEDLINE KIT) (PERIDEX) 0.12 % solution 15 mL  15 mL Mouth Rinse BID Laurence Slate M, PA-C   15 mL at 10/16/20 0830   Chlorhexidine Gluconate Cloth 2 % PADS 6 each  6 each Topical Q0600 Edrick Oh, MD   6 each at 10/15/20 2211   Darbepoetin Alfa (ARANESP) injection 150 mcg  150 mcg Intravenous Q Tue-HD Reesa Chew, MD   150 mcg at 10/16/20 1300   feeding supplement (NEPRO CARB STEADY) liquid 1,000 mL  1,000 mL Per Tube Continuous Ulyses Amor, PA-C 45 mL/hr at 10/16/20 0622 1,000 mL  at 10/16/20 0622   feeding supplement (PROSource TF) liquid 45 mL  45 mL Per Tube TID Ulyses Amor, PA-C   45 mL at 10/16/20 0902   glycopyrrolate (ROBINUL) tablet 1 mg  1 mg Per Tube BID PRN Jennelle Human B, NP   1 mg at 10/12/20 2112   guaiFENesin (ROBITUSSIN) 100 MG/5ML solution 100 mg  5 mL Per Tube BID Laurence Slate M, PA-C   100 mg at 10/16/20 2947   heparin injection 1,000 Units  1,000 Units Dialysis PRN Edrick Oh, MD       heparin injection 1,600 Units  20 Units/kg Dialysis PRN Ulyses Amor, PA-C       heparin sodium (porcine) injection 3,800 Units  3,800 Units Intravenous PRN Ulyses Amor, PA-C   3,800 Units at 10/07/20 1545   insulin aspart (novoLOG) injection 0-9 Units  0-9 Units Subcutaneous Q4H Ulyses Amor, PA-C   1 Units at 10/16/20 1142   insulin glargine-yfgn (SEMGLEE) injection 30 Units  30 Units Subcutaneous Daily Darliss Cheney, MD   30 Units at 10/16/20 0902   ipratropium-albuterol (DUONEB) 0.5-2.5 (3) MG/3ML nebulizer solution  3 mL  3 mL Nebulization Q6H PRN Laurence Slate M, PA-C       lidocaine (LIDODERM) 5 % 1 patch  1 patch Transdermal Q24H Ulyses Amor, PA-C   1 patch at 10/15/20 1028   lidocaine (PF) (XYLOCAINE) 1 % injection 5 mL  5 mL Intradermal PRN Edrick Oh, MD       lidocaine-prilocaine (EMLA) cream 1 application  1 application Topical PRN Edrick Oh, MD       MEDLINE mouth rinse  15 mL Mouth Rinse 10 times per day Ulyses Amor, PA-C   15 mL at 10/16/20 1210   metaxalone (SKELAXIN) tablet 800 mg  800 mg Per Tube TID Ulyses Amor, PA-C   800 mg at 10/16/20 1142   midodrine (PROAMATINE) tablet 10 mg  10 mg Per Tube Daily PRN Justin Mend, MD   10 mg at 10/16/20 1142   midodrine (PROAMATINE) tablet 5 mg  5 mg Per Tube TID PRN Regalado, Belkys A, MD   5 mg at 10/07/20 1225   morphine 2 MG/ML injection 1-4 mg  1-4 mg Intravenous Q2H PRN Laurence Slate M, PA-C   2 mg at 10/02/20 1024   multivitamin (RENA-VIT) tablet 1 tablet  1 tablet Per  Tube QHS Ulyses Amor, PA-C   1 tablet at 10/15/20 2143   oxyCODONE (ROXICODONE) 5 MG/5ML solution 5 mg  5 mg Per Tube Q4H Samella Parr, NP   5 mg at 10/16/20 0901   pantoprazole sodium (PROTONIX) 40 mg/20 mL oral suspension 40 mg  40 mg Per Tube Daily Debbe Odea, MD   40 mg at 10/16/20 1145   pentafluoroprop-tetrafluoroeth (GEBAUERS) aerosol 1 application  1 application Topical PRN Edrick Oh, MD       phenol (CHLORASEPTIC) mouth spray 1 spray  1 spray Mouth/Throat PRN Laurence Slate M, PA-C       polyethylene glycol (MIRALAX / GLYCOLAX) packet 17 g  17 g Per Tube Daily PRN Laurence Slate M, PA-C       sertraline (ZOLOFT) tablet 100 mg  100 mg Per Tube Daily Laurence Slate M, PA-C   100 mg at 10/16/20 0901   simethicone (MYLICON) 40 OO/8.7NZ suspension 40 mg  40 mg Per Tube QID Samella Parr, NP   40 mg at 10/16/20 0902   sodium chloride flush (NS) 0.9 % injection 10-40 mL  10-40 mL Intracatheter Q12H Laurence Slate M, PA-C   10 mL at 10/16/20 1211   sodium chloride flush (NS) 0.9 % injection 10-40 mL  10-40 mL Intracatheter PRN Laurence Slate M, PA-C   10 mL at 10/11/20 2017     ROS:  See HPI  Physical Exam:  ***  Incision:  *** Extremities:  *** Neuro: *** Abdomen:  ***   Assessment/Plan:  This is a 70 y.o. male who is s/p: Procedure:   #1 : Thrombectomy of left forearm dialysis graft                       #2: Revision of left forearm dialysis graft, via conversion from the brachial vein to a end to end anastomosis to the basilic vein                       #3: Redo left brachial artery exposure  -***   *** [facility] [phone]   Clinic MD:  ***

## 2020-10-16 NOTE — Procedures (Signed)
Request to IR for G-tube replacement, per RN patient noted to have portion of retention balloon coming out through insertion site. Unclear when this occurred but was first noted at shift change. Patient currently with 20 Fr balloon retention G-tube originally placed 12/30/2016 by general surgery (Dr. Jolinda Croak) at [facility]. Per char review IR at [facility] has declogged the G-tube at bedside on 8/20 and 8/24. It was then replaced by IR at bedside on 9/16 due to dislodgement during wound care.    Patient seen at bedside, tube feeds infusing through G tube with small amount of tube feeds leaking from tract. The retention balloon is fully inflated with the top 2/3 protruding from the skin. Tube feeds were paused and split gauze beneath the G-tube was removed. The existing balloon was deflated, the G-tube was repositioned in the existing tract and the retention balloon was re-inflated with 9 cc of NS. The bumper was cinched to the skin and trial of gentle traction did not dislodge the balloon.   May continue to use G-tube for feeds/meds/free water as ordered.  Plan: - Do not place gauze between skin and G-tube bumper as this can cause leakage of tube feeds and malposition of retention balloon - If balloon continues to protrude through skin can consider replacing existing G-tube with Entuit tube with 20 cc retention balloon vs disc retention tube  Candiss Norse, PA-C

## 2020-10-16 NOTE — Progress Notes (Signed)
Pt due for monthly trach change. Per CCM MD okay to perform 10/17/2020. Trach supplies ordered and at pt bedside. RT will continue to monitor and be available as needed.

## 2020-10-16 NOTE — Progress Notes (Signed)
[facility] PROGRESS NOTE  LAZARIUS RIVKIN HEN:[id number] DOB: 1950/05/26 DOA: 07/03/2020 PCP: Townsend Roger, MD  Status: Remains inpatient appropriate because:Hemodynamically unstable, Persistent severe electrolyte disturbances, Unsafe d/c plan, and Inpatient level of care appropriate due to severity of illness  Dispo: The patient is from: [facility] vent capable              Anticipated d/c is to: SNF Vent capable w/ access to in facility/stretcher based HD-a facility in [place] has been located in preauthorization in progress.              Patient currently is not medically stable to d/c.   Difficult to place patient Yes    Level of care: ICU  Code Status: Partial (no CPR and no defibrillation or cardioversion) Family Communication: Wife June at bedside on 9/21 DVT prophylaxis: IV Heparin COVID vaccination status: Unknown    HPI:  70 y.o. 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 Patient presented secondary to altered mental status and hypotension with recurrent pneumonia requiring vasopressor support and ICU admission.he has had multiple recent admissions for sepsis secondary to various infections including UTIs, pneumonias and bacteremia in setting of MDR Klebsiella, MRSA and pseudomonas. Most recent admission September 2021 in setting of sepsis due to MDR Klebsiella with right ureteral calculus s/p double J ureteral stent placement and treated with meropenem x 10days. He was discharged back to [facility].  ICU significant events: 6/29 admitted to ICU for septic shock on levo to maintain MAP>65, continued on vent support 6/29 Blood Cx>> staph species in 1 of 4 cultures drawn>> suspect contaminant 6/30 Off pressors 7/2 ID Consult for MDR acinetobacter in trach asp, Meropenem changed to unasyn 7/2 PRBC transfusion, iron replacement 7/5 Pressors added again. Central line placed. Drainage from gastrostomy  tube >> CT Abd without fluid collection around gastrostomy tube but with diffuse body wall edema. Echo EF 50-55%, mild LVH 7/6 Renal Replacement Therapy ; Code status changed to partial Code (DNR) 7/7 Transfuse 1U PRBC 7/9 add solu cortef 7/10 off pressors, weaning stress steroids 7/11 Pressors added back on again 7/12 ID consult due to rising WBC, hypothermia, hypotension concern for worsening infection. Vancomycin added. Repeat blood cx collected. 7/15 CRRT stopped 7/17 Restarted on low dose levo. No evidence of new infection. Held off on antibiotics. Nephrology contacted family and decision was made to continue iHD this week to allow additional time for renal recovery. Patient is not the best long term iHD candidate but family wants aggressive care 7/18 plans for iHD, tolerated with 2L removed 719 slight drop in hgb to 6.9 will transfuse 1 unit PRBC 7/22 > 7/26: Tolerating HD. TOC consulted for placement. Will likely need out of state placement. 8/15 WBCs greater than 28,000, hypoglycemia, procalcitonin >18, chest x-ray consistent with pneumonia, recent blood cultures consistent with contamination.  Unasyn IV resumed after sputum culture obtained 8/16 consultation placed with ethics committee regarding evaluation for futility of care 8/16 Sputum cx + for Acinetobacter with sensitivities pending 8/17 extensive conversation held with patient's wife regarding patient's poor prognosis and reemergence of Acinetobacter VAP.  Also discussed with her that patient has been able to convey to multiple staff that he does not want to continue on the ventilator or with dialysis.  Wife reports that the patient does not understand what he is saying, she is the POA and that she wants to continue aggressive care. 8/18 patient now telling nephrology, myself and the rest  of the staff he replies yes to wanting to remain on dialysis and on the ventilator and this direct question is asked. 8/18 Acinetobacter sensitive  to Unasyn only.  Discussed with ID/Dr. Juleen China.  Plan is to administer antibiotics for 7-day since patient has not had any increased O2 needs and remains afebrile.  If any concerns over worsening symptoms can give an additional 3 days. Developed moderate bleeding around tracheostomy site on 8/19.  Likely related to combination of Eliquis and heparin required for CRRT.  We will hold Eliquis for now and monitor degree of bleeding Eliquis resumed on 8/22.  Overnight into 8/23 patient bit his tongue and had bleeding.  Also had some bleeding around trach likely from oral bleeding.  Also though did have bleeding from old central line site. 8/23 had transient bleeding around trach and prior central line insertion site overnight.  Patient had bitten tongue and this likely explain the bleeding.  This morning patient was not having any bleeding.  On 8/25 had redeveloped slow ooze bleeding from mouth and trach.  Eliquis discontinued in favor of IV heparin especially given patient will undergo surgical procedure for vascular access on 8/29 8/30 Left FA AVG occluded 9/2 status post thrombectomy and revision of LUE AV graft 9/4 LUE edema with negative venous duplex. 9/5 heparin discontinued and Eliquis resumed    Subjective: Bland affect.  Will look at you when you speak to him but makes no effort to acknowledge what you have just sent to him.  Objective: Vitals:   10/16/20 0500 10/16/20 0600  BP: 105/72 103/64  Pulse: 91 92  Resp: 20 20  Temp:    SpO2: 98% 98%    Intake/Output Summary (Last 24 hours) at 10/16/2020 0745 Last data filed at 10/16/2020 0600 Gross per 24 hour  Intake 1125 ml  Output 145 ml  Net 980 ml   Filed Weights   10/14/20 0334 10/15/20 0500 10/16/20 0136  Weight: 81.2 kg 77.3 kg 77.8 kg    Exam:  Constitutional: Awake, flat/bland affect Respiratory: Trach: 6.0 XLT cuffed, PRVC mode, FiO2 40% -rate 20-PEEP 5; lung sounds are coarse with bilateral rhonchi, no oozing around  trach but she continues to have frothy drainage from the left side of his mouth Cardiovascular: Sinus rhythm, stable edema LUE (hand and forearm) -normotensive -AVG palpable thrill.  Some hypotension with dialysis requiring midodrine Abdomen: LBM 10/04, PEG tube accidentally removed,  Left lower quadrant colostomy with stool, abdomen nontender with normoactive bowel sounds.  Neurologic: Chronic quadriplegia. No sensation below shoulders. Psychiatric: Awake, and flat affect.  Appears depressed.   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 - ventilator management per PCCM Failed recent SBT secondary to apnea  Recurrent ventilator associated pneumonia 2/2 recurrent Acinetobacter/sepsis without shock Has been treated x2 this admission for Acinetobacter tracheobronchitis/pneumonia    AKI with oliguria/volume overload/ New CKD requiring HD Anuric-Nephrology following, initially required CRRT and has transitioned to HD -LUE AVG functional x4 weeks Nephrology documents volume overload that has not been easily removed with dialysis therefore extra dialysis sessions stopped in favor of TTS schedule. If able to successfully use AVG with days treatment plan is to remove [facility]   Septic shock 2/2 VAP POA resolved  Chronic thoracic back pain Continue scheduled low-dose Oxy IR along, IV morphine prn, Skelaxin and lidocaine patch   History of paroxysmal atrial fibrillation Continue amiodarone and Eliquis 9/30 QTC 385 ms   Acute metabolic encephalopathy Resolved  Diabetes mellitus, type 2 Continue Semglee 30 units daily.  Continue SSI w/ CBG checks every 4 hours  Acute diverticulitis Resolved after treatment with Flagyl  Nodular irregular thickening of the ascending colon-?  [facility] consulted.  They discussed with his wife.  Given patient's debilitated state/diverticulitis they recommended holding  off on colonoscopy for now.  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  Right/posterior/lateral ankle unknown if present on admission.  DTI left ear, not present on admission. (For additional documentation see wound care notes) Patient recently underwent bedside debridement of sacral and heel wound.  No evidence of infection found.  Wounds now with red granular base.  Continue wound packing as prior.    Data Reviewed: Basic Metabolic Panel: Recent Labs  Lab 10/10/20 0357 10/11/20 0430 10/15/20 0500  NA 132* 130* 131*  K 3.6 3.9 4.9  CL 95* 93* 91*  CO2 _0 GLUCOSE 156* 158* 136*  BUN 59* 94* 108*  CREATININE 1.07 1.48* 1.62*  CALCIUM 9.3 9.6 9.6  PHOS  --  4.7* 6.0*   Liver Function Tests: Recent Labs  Lab 10/11/20 0430 10/15/20 0500  ALBUMIN 1.7* 1.7*    CBC: Recent Labs  Lab 10/10/20 0357 10/11/20 0430  WBC 21.1* 21.5*  HGB 8.9* 8.3*  HCT 29.1* 26.7*  MCV 78.4* 76.5*  PLT 512* 487*   CBG: Recent Labs  Lab 10/15/20 1151 10/15/20 1551 10/15/20 1952 10/15/20 2351 10/16/20 0332  GLUCAP 116* 118* 139* 112* 112*      Scheduled Meds:  amiodarone  200 mg Per Tube  Daily   apixaban  5 mg Per Tube BID   buPROPion  75 mg Per Tube BID   chlorhexidine gluconate (MEDLINE KIT)  15 mL Mouth Rinse BID   Chlorhexidine Gluconate Cloth  6 each Topical Q0600   darbepoetin (ARANESP) injection - DIALYSIS  150 mcg Intravenous Q Tue-HD   feeding supplement (PROSource TF)  45 mL Per Tube TID   guaiFENesin  5 mL Per Tube BID   insulin aspart  0-9 Units Subcutaneous Q4H   insulin glargine-yfgn  30 Units Subcutaneous Daily   lidocaine  1 patch Transdermal Q24H   mouth rinse  15 mL Mouth Rinse 10 times per day   metaxalone  800 mg Per Tube TID   multivitamin  1 tablet Per Tube QHS   oxyCODONE  5 mg Per Tube Q4H   pantoprazole sodium  40 mg Per Tube Daily   sertraline  100 mg Per Tube Daily   simethicone  40 mg Per Tube QID   sodium chloride flush  10-40 mL Intracatheter Q12H   Continuous Infusions:  sodium chloride 10 mL/hr at 09/27/2020 1429   sodium chloride 500 mL (09/01/20 1408)   sodium chloride     sodium chloride  feeding supplement (NEPRO CARB STEADY) 1,000 mL (10/16/20 0622)    Principal Problem:   Acute respiratory failure with hypoxia (HCC) Active Problems:   Ventilator dependent (HCC)   Sacral decubitus ulcer   Septic shock (HCC)   Pressure injury of skin   AKI (acute kidney injury) ([place])   Hypotension   Goals of care, counseling/discussion   Quadriplegia (HCC)   Chronic kidney disease requiring chronic dialysis ([place])   Hyperglycemia   Sepsis due to Acinetobacter baumannii ([place])   Pneumonia due to Acinetobacter species ([place])   Dysautonomia ([place])   Chronic midline thoracic back pain   Abdominal distention   Leukocytosis   Consultants: PCCM Nephrology Infectious disease Palliative care medicine  Procedures: Echocardiogram Insertion of double-lumen HD catheter  Antibiotics: Cefepime x1 dose Meropenem 6/29 through 7/2 Vancomycin 6/29 through 6/30 Unasyn 7/2 through 7/15 Unasyn 8/15 >8/22   Time spent: 25  minutes    Erin Hearing ANP  [facility] 7 am - 330 pm/M-F for direct patient care and secure chat Please refer to Amion for contact info 97  days

## 2020-10-16 NOTE — Progress Notes (Signed)
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  Presented from [place] with AMS  hypotension and pneumonia resultant septic shock is admitted to the intensive care unit.  Has had multiple admissions in the past for Klebsiella, MRSA and Pseudomonas urinary tract infections and pneumonia.  His most recent admission 10/03/2020 with multiple drug-resistant Klebsiella and right ureteral calculus requiring double-J stent.  Renal replacement therapy was initiated 07/18/2020.       Assessment/ Plan:   1.Acute kidney Injury-prolonged dialysis dependency, now progressed to ESRD:  ATN from septic shock-without any evidence of renal recovery to date . initiated on CRRT 07/18/20 and transitioned to IHD.  Has TDC & AVG, has been tolerating hd with AVG.  BP limited extra HD on 10/2.   HD today - will try for 2L UF, use profile 2, low temp and pre HD midodrine for BP support today. .   2 Vascular access - had left forearm AVG placed 08/22/2020 but unfortunately had clotted.  s/p thrombectomy and revision 9/2. Tolerated  new cannulation on 9/28. Plan to use it again 10/4 for HD and if successful may be able to d/c [facility].  May need eval of LUE edema if continues.    3 Septic shock/ventilator associated pneumonia due to recurrent Acinetobacter infection: Off antibiotics and stable hemodynamics.   4 Leukocytosis -persistent, will defer to primary service for further mgmt   5 Chronic respiratory failure: Status post tracheostomy with ventilator management per CCM.   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   7 Chronic kidney disease/metabolic bone disease: Calcium and phosphorus level under control with hemodialysis, off binders. Check PTH - pending from 10/1   8 Anemia: contiue ESA increased to 124mg starting 10/4, monitor Hb.  Transfuse as needed.     9 Acute metabolic encephalopathy: Continue management as above.,  Supportive care.   10 Disposition - pt cannot return to [facility] now that he is ESRD. [facility] [place] [facility] [place] & his AVG needs to be mature prior to being accepted. Other possible barrier is pt is a permanent trach & vent pt.    Subjective:   No acute events overnight.  Blood pressure limited HD yesterday - extra treatment for volume was abnorted.  No other changes   Objective:   BP 103/64   Pulse 92   Temp 97.9 F (36.6 C) (Oral)   Resp 20   Ht 5' 6"  (1.676 m)   Wt 77.8 kg   SpO2 98%   BMI 27.68 kg/m   Intake/Output Summary (Last 24 hours) at 10/16/2020 0636 Last data filed at 10/16/2020 0600 Gross per 24 hour  Intake 1170 ml  Output 145 ml  Net 1025 ml    Weight change: 0.5 kg  Physical Exam: General:NAD, tracheostomy on vent. Heart: normal rate, s1s2 nl Lungs: trach, no increased work of breathing. Abdomen:soft, Non-tender, PEG tube and ostomy bag Extremities: Trace lower extremities edema Neuro: awake Dialysis Access: RIJ TDC, Lt FAL AVG decent bruit but 2+ pitting edema noted of L arm.  Has L sided power line in L chest.  Sutures intact.  Imaging: No results found.  Labs: BMET Recent Labs  Lab 10/09/20 0654 10/10/20 0357 10/11/20 0430 10/15/20 0500  NA 131* 132* 130* 131*  K 4.1 3.6 3.9 4.9  CL 92* 95* 93* 91*  CO2 23  24 22 22   GLUCOSE 142* 156* 158* 136*  BUN 102* 59* 94* 108*  CREATININE 1.55* 1.07 1.48* 1.62*  CALCIUM 9.4 9.3 9.6 9.6  PHOS 5.5*  --  4.7* 6.0*    CBC Recent Labs  Lab 10/10/20 0357 10/11/20 0430  WBC 21.1* 21.5*  HGB 8.9* 8.3*  HCT 29.1* 26.7*  MCV 78.4* 76.5*  PLT 512* 487*     Medications:     amiodarone  200 mg Per Tube Daily   apixaban  5 mg Per Tube BID   buPROPion  75 mg Per Tube BID   chlorhexidine gluconate (MEDLINE KIT)  15 mL Mouth Rinse BID   Chlorhexidine Gluconate Cloth  6 each Topical  Q0600   darbepoetin (ARANESP) injection - DIALYSIS  150 mcg Intravenous Q Tue-HD   feeding supplement (PROSource TF)  45 mL Per Tube TID   guaiFENesin  5 mL Per Tube BID   insulin aspart  0-9 Units Subcutaneous Q4H   insulin glargine-yfgn  30 Units Subcutaneous Daily   lidocaine  1 patch Transdermal Q24H   mouth rinse  15 mL Mouth Rinse 10 times per day   metaxalone  800 mg Per Tube TID   multivitamin  1 tablet Per Tube QHS   oxyCODONE  5 mg Per Tube Q4H   pantoprazole sodium  40 mg Per Tube Daily   sertraline  100 mg Per Tube Daily   simethicone  40 mg Per Tube QID   sodium chloride flush  10-40 mL Intracatheter Q12H      Justin Mend  10/16/2020, 6:36 AM

## 2020-10-17 ENCOUNTER — Inpatient Hospital Stay (HOSPITAL_COMMUNITY): Payer: 59

## 2020-10-17 DIAGNOSIS — M7989 Other specified soft tissue disorders: Secondary | ICD-10-CM | POA: Diagnosis not present

## 2020-10-17 DIAGNOSIS — J9601 Acute respiratory failure with hypoxia: Secondary | ICD-10-CM | POA: Diagnosis not present

## 2020-10-17 HISTORY — PX: IR REPLACE G-TUBE SIMPLE WO FLUORO: IMG2323

## 2020-10-17 LAB — GLUCOSE, CAPILLARY
Glucose-Capillary: 139 mg/dL — ABNORMAL HIGH (ref 70–99)
Glucose-Capillary: 127 mg/dL — ABNORMAL HIGH (ref 70–99)
Glucose-Capillary: 113 mg/dL — ABNORMAL HIGH (ref 70–99)
Glucose-Capillary: 84 mg/dL (ref 70–99)
Glucose-Capillary: 72 mg/dL (ref 70–99)
Glucose-Capillary: 106 mg/dL — ABNORMAL HIGH (ref 70–99)

## 2020-10-17 MED ORDER — MIDODRINE HCL 5 MG PO TABS
5.0000 mg | ORAL_TABLET | Freq: Three times a day (TID) | ORAL | Status: DC | PRN
  Administered 2020-10-18: 5 mg
  Filled 2020-10-17: qty 1

## 2020-10-17 MED ORDER — ALTEPLASE 2 MG IJ SOLR
2.0000 mg | Freq: Once | INTRAMUSCULAR | Status: AC
  Administered 2020-10-17: 2 mg
  Filled 2020-10-17: qty 2

## 2020-10-17 NOTE — Progress Notes (Signed)
[facility] PROGRESS NOTE  Luis Orr KCL:[id number] DOB: May 20, 1950 DOA: 07/10/2020 PCP: Townsend Roger, MD  Status: Remains inpatient appropriate because:Hemodynamically unstable, Persistent severe electrolyte disturbances, Unsafe d/c plan, and Inpatient level of care appropriate due to severity of illness  Dispo: The patient is from: SNF[facility] vent unit              Anticipated d/c is to: SNF Vent capable w/ access to in facility/stretcher based HD-a facility in [place] has been located in preauthorization in progress.              Patient currently is not medically stable to d/c.   Difficult to place patient Yes    Level of care: ICU  Code Status: Partial (no CPR and no defibrillation or cardioversion) Family Communication: Wife June at bedside on 9/21 DVT prophylaxis: IV Heparin COVID vaccination status: Unknown    HPI:  70 y.o. 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 Patient presented secondary to altered mental status and hypotension with recurrent pneumonia requiring vasopressor support and ICU admission.he has had multiple recent admissions for sepsis secondary to various infections including UTIs, pneumonias and bacteremia in setting of MDR Klebsiella, MRSA and pseudomonas. Most recent admission September 2021 in setting of sepsis due to MDR Klebsiella with right ureteral calculus s/p double J ureteral stent placement and treated with meropenem x 10days. He was discharged back to [facility].  ICU significant events: 6/29 admitted to ICU for septic shock on levo to maintain MAP>65, continued on vent support 6/29 Blood Cx>> staph species in 1 of 4 cultures drawn>> suspect contaminant 6/30 Off pressors 7/2 ID Consult for MDR acinetobacter in trach asp, Meropenem changed to unasyn 7/2 PRBC transfusion, iron replacement 7/5 Pressors added again. Central line placed. Drainage from gastrostomy tube  >> CT Abd without fluid collection around gastrostomy tube but with diffuse body wall edema. Echo EF 50-55%, mild LVH 7/6 Renal Replacement Therapy ; Code status changed to partial Code (DNR) 7/7 Transfuse 1U PRBC 7/9 add solu cortef 7/10 off pressors, weaning stress steroids 7/11 Pressors added back on again 7/12 ID consult due to rising WBC, hypothermia, hypotension concern for worsening infection. Vancomycin added. Repeat blood cx collected. 7/15 CRRT stopped 7/17 Restarted on low dose levo. No evidence of new infection. Held off on antibiotics. Nephrology contacted family and decision was made to continue iHD this week to allow additional time for renal recovery. Patient is not the best long term iHD candidate but family wants aggressive care 7/18 plans for iHD, tolerated with 2L removed 719 slight drop in hgb to 6.9 will transfuse 1 unit PRBC 7/22 > 7/26: Tolerating HD. TOC consulted for placement. Will likely need out of state placement. 8/15 WBCs greater than 28,000, hypoglycemia, procalcitonin >18, chest x-ray consistent with pneumonia, recent blood cultures consistent with contamination.  Unasyn IV resumed after sputum culture obtained 8/16 consultation placed with ethics committee regarding evaluation for futility of care 8/16 Sputum cx + for Acinetobacter with sensitivities pending 8/17 extensive conversation held with patient's wife regarding patient's poor prognosis and reemergence of Acinetobacter VAP.  Also discussed with her that patient has been able to convey to multiple staff that he does not want to continue on the ventilator or with dialysis.  Wife reports that the patient does not understand what he is saying, she is the POA and that she wants to continue aggressive care. 8/18 patient now telling nephrology, myself and the rest  of the staff he replies yes to wanting to remain on dialysis and on the ventilator and this direct question is asked. 8/18 Acinetobacter sensitive to  Unasyn only.  Discussed with ID/Dr. Juleen China.  Plan is to administer antibiotics for 7-day since patient has not had any increased O2 needs and remains afebrile.  If any concerns over worsening symptoms can give an additional 3 days. Developed moderate bleeding around tracheostomy site on 8/19.  Likely related to combination of Eliquis and heparin required for CRRT.  We will hold Eliquis for now and monitor degree of bleeding Eliquis resumed on 8/22.  Overnight into 8/23 patient bit his tongue and had bleeding.  Also had some bleeding around trach likely from oral bleeding.  Also though did have bleeding from old central line site. 8/23 had transient bleeding around trach and prior central line insertion site overnight.  Patient had bitten tongue and this likely explain the bleeding.  This morning patient was not having any bleeding.  On 8/25 had redeveloped slow ooze bleeding from mouth and trach.  Eliquis discontinued in favor of IV heparin especially given patient will undergo surgical procedure for vascular access on 8/29 8/30 Left FA AVG occluded 9/2 status post thrombectomy and revision of LUE AV graft 9/4 LUE edema with negative venous duplex. 9/5 heparin discontinued and Eliquis resumed 10/4 PEG tube partially dislodged in IR replaced at bedside    Subjective: Patient awake and appears to be watching TV.  Did track my movements around the room.  Still with bland affect.  With palpation he did wince and not that his abdomen was in pain.  Objective: Vitals:   10/17/20 0630 10/17/20 0700  BP: 108/67 (!) 115/57  Pulse: 87 85  Resp: 20 20  Temp:    SpO2: 97% 99%    Intake/Output Summary (Last 24 hours) at 10/17/2020 0728 Last data filed at 10/17/2020 0600 Gross per 24 hour  Intake 650 ml  Output 775 ml  Net -125 ml   Filed Weights   10/15/20 0500 10/16/20 0136 10/17/20 0400  Weight: 77.3 kg 77.8 kg 76.8 kg    Exam:  Constitutional: Awake, flat/bland affect Respiratory: Trach:  6.0 XLT cuffed, PRVC mode, FiO2 40% -rate 20-PEEP 5; anterior lung sounds today are clear to auscultation and diminished in the bases.  Suspect he has been recently suctioned.  No frothiness around trach or leaking from mouth Cardiovascular: Continues to maintain sinus rhythm, stable edema LUE (hand and forearm) -normotensive -AVG palpable thrill. Abdomen: LBM 10/04, PEG tube, slightly tender in mid abdomen and area where PEG tube had to be replaced.  Left lower quadrant colostomy with liquid stool. Neurologic: Chronic quadriplegia. No sensation below shoulders. Psychiatric: Awake, and flat affect.  Appears depressed.   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 - ventilator management per PCCM Failed recent SBT secondary to apnea  Recurrent ventilator associated pneumonia 2/2 recurrent Acinetobacter/sepsis without shock Has been treated x2 this admission for Acinetobacter tracheobronchitis/pneumonia   Persistent LUE edema Previous imaging for SVT/DVT unremarkable Nephrology concerned since it has not resolved with elevation therefore have consulted IR for more detailed imaging ( ?  Subclavian vein involvement) The fistula remains functional Currently on Eliquis and does receive heparin with HD   AKI with oliguria/volume overload/ New CKD requiring HD Anuric-Nephrology following, initially required CRRT and has transitioned to HD -LUE AVG functional x4 weeks Nephrology documents volume overload that has not been easily removed with  dialysis therefore extra dialysis sessions stopped in favor of TTS schedule. 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   Today on 10/5 tube has become displaced once again and IR reconsulted for replacement  Pressure injury/stage IV sacral decubitus Stage IV medial/posterior sacrum present on admission.  Right/posterior/lateral ankle unknown if present on admission.  DTI left ear, not present on admission. (For additional documentation see wound care notes) Nursing states bleeding with wound care. WOC RN consulted and bedside nurse will ask if wound is appropriate for VAC   Septic shock 2/2 VAP POA resolved  Chronic thoracic back pain Continue scheduled low-dose Oxy IR along, IV morphine prn, Skelaxin and lidocaine patch   History of paroxysmal atrial fibrillation Continue amiodarone and Eliquis 9/30 QTC 385 ms   Acute metabolic encephalopathy Resolved  Diabetes mellitus, type 2 Continue Semglee 30 units daily.  Continue SSI w/ CBG checks every 4 hours  Acute diverticulitis Resolved after treatment with Flagyl  Nodular irregular thickening of the ascending colon-?  Neoplasm [facility] consulted.  They discussed with his wife.  Given patient's debilitated state/diverticulitis they recommended holding off on colonoscopy for now.  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  Plan is to discharge to facility in [place]   Microcytic anemia/Acute anemia superimposed on anemia of chronic kidney disease S/P 6 units of PRBC -Continue Aranesp; Hb average between 8 and 9      Data Reviewed: Basic Metabolic Panel: Recent Labs  Lab 10/11/20 0430 10/15/20 0500  NA 130* 131*  K 3.9 4.9  CL 93* 91*  CO2 22 22  GLUCOSE 158* 136*  BUN 94* 108*  CREATININE 1.48* 1.62*  CALCIUM 9.6 9.6  PHOS 4.7* 6.0*   Liver  Function Tests: Recent Labs  Lab 10/11/20 0430 10/15/20 0500  ALBUMIN 1.7* 1.7*    CBC: Recent Labs  Lab 10/11/20 0430  WBC 21.5*  HGB 8.3*  HCT 26.7*  MCV 76.5*  PLT 487*   CBG: Recent Labs  Lab 10/16/20 1131 10/16/20 1509 10/16/20 1931 10/16/20 2333 10/17/20 0325  GLUCAP 133* 71 125* 118* 139*      Scheduled Meds:  amiodarone  200 mg Per Tube Daily   apixaban  5 mg Per Tube BID   buPROPion  75 mg Per Tube BID   chlorhexidine gluconate (MEDLINE KIT)  15 mL Mouth Rinse BID   Chlorhexidine Gluconate Cloth  6 each Topical Q0600   darbepoetin (ARANESP) injection - DIALYSIS  150 mcg Intravenous Q Tue-HD   feeding supplement (PROSource TF)  45 mL Per Tube TID   guaiFENesin  5 mL Per Tube BID   insulin aspart  0-9 Units Subcutaneous Q4H   insulin glargine-yfgn  30 Units Subcutaneous Daily   lidocaine  1 patch Transdermal Q24H   mouth rinse  15 mL Mouth Rinse 10 times per day   metaxalone  800 mg Per Tube TID   multivitamin  1 tablet Per Tube QHS   oxyCODONE  5 mg Per Tube Q4H   pantoprazole sodium  40 mg Per Tube Daily   sertraline  100 mg Per Tube Daily   simethicone  40 mg Per Tube QID   sodium chloride flush  10-40 mL Intracatheter Q12H   Continuous Infusions:  sodium chloride 10 mL/hr at 10/12/2020 1429   sodium chloride 500 mL (09/01/20 1408)   sodium chloride     sodium chloride     feeding supplement (NEPRO CARB STEADY) 45 mL/hr at 10/17/20 0600    Principal Problem:   Acute respiratory failure with hypoxia (HCC) Active Problems:   Ventilator dependent (HCC)   Sacral decubitus ulcer   Septic shock (HCC)   Pressure injury of skin   AKI (acute kidney injury) ([place])   Hypotension   Goals of care, counseling/discussion   Quadriplegia ([place])   Chronic kidney disease requiring chronic dialysis ([place])   Hyperglycemia   Sepsis due to Acinetobacter baumannii ([place])   Pneumonia due to Acinetobacter species ([place])   Dysautonomia ([place])   Chronic midline  thoracic back pain   Abdominal distention   Leukocytosis   Consultants: PCCM Nephrology Infectious disease Palliative care medicine  Procedures: Echocardiogram Insertion of double-lumen HD catheter  Antibiotics: Cefepime x1 dose Meropenem 6/29 through 7/2 Vancomycin 6/29 through 6/30 Unasyn 7/2 through 7/15 Unasyn 8/15 >8/22   Time spent: 25 minutes    Erin Hearing ANP  [facility] 7 am - 330 pm/M-F for direct patient care and secure chat Please refer to Amion for contact info 98  days

## 2020-10-17 NOTE — Progress Notes (Signed)
CSW spoke with Luis Orr at [facility] who states he is waiting on additional information regarding life insurance policies that were listed on the bank statement's that were provided by the patient's wife. Luis Orr to contact patient's wife and will return call to CSW once more information is available.  Madilyn Fireman, MSW, LCSW Transitions of Care  Clinical Social Worker II [phone]

## 2020-10-17 NOTE — Consult Note (Signed)
WOC Nurse Consult Note: Patient receiving care in [facility] 2M16. Reason for Consult: "Stage 4 wound on sacrum is having increasingly bloody discharge" Wound type: stage 4 PI Pressure Injury POA: Yes Measurement: Wound bed: Drainage (amount, consistency, odor)  Periwound: Dressing procedure/placement/frequency: I have modified the daily saline moistened gauze dressing to twice daily. That should prevent the dressing from drying out, and ease removal from the wound, and decrease bleeding from the area.  [place] nurse will not follow at this time.  Please re-consult the [place] team if needed.  Val Riles, RN, MSN, CWOCN, CNS-BC, pager [phone]

## 2020-10-17 NOTE — Progress Notes (Signed)
RT NOTE: RT X 2 changed patient's trach to new #6 [place] XLT prox with no complications. Positive color change noted on ETCO2. Vitals are stable. RT will continue to monitor.

## 2020-10-17 NOTE — Progress Notes (Signed)
Duplex dialysis access left study completed.   Please see CV Proc for preliminary results.   Darlin Coco, RDMS, RVT

## 2020-10-17 NOTE — Procedures (Addendum)
Request to IR for G-tube replacement. Patient currently with 20 Fr balloon retention G-tube originally placed 12/30/2016 by general surgery (Dr. Jolinda Croak) at [facility]. Per char review IR at [facility] has declogged the G-tube at bedside on 8/20 and 8/24. It was then replaced by IR at bedside on 9/16 due to dislodgement during wound care and again on 10/4 due to the retention balloon protruding from the insertion site.   IR consulted again today as the G-tube is now completely dislodged.  Patient seen at beside, 20 fr foley in tract. Foley balloon deflated and foley removed without issue. A 20 Fr Entuit balloon retention G-tube was placed within the existing tract and the retention balloon was inflated with 20 cc of NS, gastric contents noted in tube after placement. Gentle manual traction used to secure retention balloon to the anterior abdominal wall and bumper cinched securely to the skin.  Plan: - KUB ordered to confirm position, once position confirmed may resume use for feeds/meds/free water as ordered - If G-tube continues to have issues with frequent dislodgement could consider replacement with pull through or disc retention tube.  Candiss Norse, PA-C

## 2020-10-17 NOTE — Progress Notes (Signed)
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  Presented from [place] with AMS  hypotension and pneumonia resultant septic shock is admitted to the intensive care unit.  Has had multiple admissions in the past for Klebsiella, MRSA and Pseudomonas urinary tract infections and pneumonia.  His most recent admission 10/03/2020 with multiple drug-resistant Klebsiella and right ureteral calculus requiring double-J stent.  Renal replacement therapy was initiated 07/18/2020.       Assessment/ Plan:   1.Acute kidney Injury-prolonged dialysis dependency, now progressed to ESRD:  ATN from septic shock-without any evidence of renal recovery to date . initiated on CRRT 07/18/20 and transitioned to IHD.  Has TDC & AVG, has been tolerating hd with AVG.  BP limited extra HD on 10/2.   HD tomorrow - will try for 2L UF, use profile 2, low temp and pre HD midodrine for BP support.  Try to avoid albumin as not available outpt.    2 Vascular access - had left forearm AVG placed 09/08/2020 but unfortunately had clotted.  s/p thrombectomy and revision 9/2. Tolerated  new cannulation on 9/28, used 10/4.  LUE edema continues to worsen and bruit decreased today - check doppler US today and plan to consult IR for eval once doppler returns.  Maintain TDC for now.     3 Septic shock/ventilator associated pneumonia due to recurrent Acinetobacter infection: Off antibiotics and stable hemodynamics.   4 Leukocytosis -persistent, will defer to primary service for further mgmt   5 Chronic respiratory failure: Status post tracheostomy with ventilator management per CCM.   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   7 Chronic kidney disease/metabolic bone disease: [place] Calcium high - suspect immobility with low PTH - no ca supplements, low ca dialsate.  Phosphorus  level under control with hemodialysis, off binders. PTH 26 on 10/1   8 Anemia: contiue ESA increased to 172mg starting 10/4, monitor Hb.  Transfuse as needed.    9 Acute metabolic encephalopathy: Continue management as above.,  Supportive care.   10 Disposition - pt cannot return to [facility] now that he is ESRD. [facility] [place] He [place] & his AVG needs to be mature prior to being accepted. Barrier is pt is a permanent trach & vent pt.    Subjective:   No acute events overnight.  Blood pressure initial challenge with HD but ended up tolerating  0.7L UF.  LUE remains swollen.   Objective:   BP 108/67   Pulse 87   Temp 97.9 F (36.6 C) (Oral)   Resp 20   Ht 5' 6"  (1.676 m)   Wt 76.8 kg   SpO2 97%   BMI 27.33 kg/m   Intake/Output Summary (Last 24 hours) at 10/17/2020 0709 Last data filed at 10/17/2020 0600 Gross per 24 hour  Intake 650 ml  Output 775 ml  Net -125 ml    Weight change: -1 kg  Physical Exam: General:NAD, tracheostomy on vent. Heart: normal rate, s1s2 nl Lungs: trach, no increased work of breathing. Abdomen:soft, Non-tender, PEG tube and ostomy bag Extremities: Trace lower extremities edema Neuro: awake Dialysis Access: RIJ TDC, Lt FAL AVG decreased bruit and 2+ pitting edema noted of L arm.  Has L sided power line in L chest.  Sutures intact.  Imaging: No results found.  Labs: BDIRECTVRecent Labs  Lab 10/11/20 0430  10/15/20 0500  NA 130* 131*  K 3.9 4.9  CL 93* 91*  CO2 22 22  GLUCOSE 158* 136*  BUN 94* 108*  CREATININE 1.48* 1.62*  CALCIUM 9.6 9.6  PHOS 4.7* 6.0*    CBC Recent Labs  Lab 10/11/20 0430  WBC 21.5*  HGB 8.3*  HCT 26.7*  MCV 76.5*  PLT 487*     Medications:     amiodarone  200 mg Per Tube Daily   apixaban  5 mg Per Tube BID   buPROPion  75 mg Per Tube BID   chlorhexidine gluconate (MEDLINE KIT)  15 mL Mouth Rinse BID   Chlorhexidine Gluconate Cloth  6 each Topical Q0600    darbepoetin (ARANESP) injection - DIALYSIS  150 mcg Intravenous Q Tue-HD   feeding supplement (PROSource TF)  45 mL Per Tube TID   guaiFENesin  5 mL Per Tube BID   insulin aspart  0-9 Units Subcutaneous Q4H   insulin glargine-yfgn  30 Units Subcutaneous Daily   lidocaine  1 patch Transdermal Q24H   mouth rinse  15 mL Mouth Rinse 10 times per day   metaxalone  800 mg Per Tube TID   multivitamin  1 tablet Per Tube QHS   oxyCODONE  5 mg Per Tube Q4H   pantoprazole sodium  40 mg Per Tube Daily   sertraline  100 mg Per Tube Daily   simethicone  40 mg Per Tube QID   sodium chloride flush  10-40 mL Intracatheter Q12H      Justin Mend  10/17/2020, 7:09 AM

## 2020-10-18 ENCOUNTER — Inpatient Hospital Stay (HOSPITAL_COMMUNITY): Payer: 59

## 2020-10-18 DIAGNOSIS — J9601 Acute respiratory failure with hypoxia: Secondary | ICD-10-CM | POA: Diagnosis not present

## 2020-10-18 LAB — BASIC METABOLIC PANEL
Anion gap: 17 — ABNORMAL HIGH (ref 5–15)
BUN: 94 mg/dL — ABNORMAL HIGH (ref 8–23)
CO2: 22 mmol/L (ref 22–32)
Calcium: 9.2 mg/dL (ref 8.9–10.3)
Chloride: 92 mmol/L — ABNORMAL LOW (ref 98–111)
Creatinine, Ser: 1.64 mg/dL — ABNORMAL HIGH (ref 0.61–1.24)
GFR, Estimated: 45 mL/min — ABNORMAL LOW (ref >60)
Glucose, Bld: 175 mg/dL — ABNORMAL HIGH (ref 70–99)
Potassium: 3.7 mmol/L (ref 3.5–5.1)
Sodium: 131 mmol/L — ABNORMAL LOW (ref 135–145)

## 2020-10-18 LAB — CBC WITH DIFFERENTIAL/PLATELET
Abs Immature Granulocytes: 0.9 10*3/uL — ABNORMAL HIGH (ref 0.00–0.07)
Basophils Absolute: 0.1 10*3/uL (ref 0.0–0.1)
Basophils Relative: 0 %
Eosinophils Absolute: 0 10*3/uL (ref 0.0–0.5)
Eosinophils Relative: 0 %
HCT: 25.6 % — ABNORMAL LOW (ref 39.0–52.0)
Hemoglobin: 8.1 g/dL — ABNORMAL LOW (ref 13.0–17.0)
Immature Granulocytes: 2 %
Lymphocytes Relative: 4 %
Lymphs Abs: 1.6 10*3/uL (ref 0.7–4.0)
MCH: 23.9 pg — ABNORMAL LOW (ref 26.0–34.0)
MCHC: 31.6 g/dL (ref 30.0–36.0)
MCV: 75.5 fL — ABNORMAL LOW (ref 80.0–100.0)
Monocytes Absolute: 2 10*3/uL — ABNORMAL HIGH (ref 0.1–1.0)
Monocytes Relative: 5 %
Neutro Abs: 38.7 10*3/uL — ABNORMAL HIGH (ref 1.7–7.7)
Neutrophils Relative %: 89 %
Platelets: 524 10*3/uL — ABNORMAL HIGH (ref 150–400)
RBC: 3.39 MIL/uL — ABNORMAL LOW (ref 4.22–5.81)
RDW: 20.9 % — ABNORMAL HIGH (ref 11.5–15.5)
WBC: 43.3 10*3/uL — ABNORMAL HIGH (ref 4.0–10.5)
nRBC: 0.5 % — ABNORMAL HIGH (ref 0.0–0.2)

## 2020-10-18 LAB — GLUCOSE, CAPILLARY
Glucose-Capillary: 178 mg/dL — ABNORMAL HIGH (ref 70–99)
Glucose-Capillary: 159 mg/dL — ABNORMAL HIGH (ref 70–99)
Glucose-Capillary: 164 mg/dL — ABNORMAL HIGH (ref 70–99)
Glucose-Capillary: 135 mg/dL — ABNORMAL HIGH (ref 70–99)
Glucose-Capillary: 228 mg/dL — ABNORMAL HIGH (ref 70–99)
Glucose-Capillary: 220 mg/dL — ABNORMAL HIGH (ref 70–99)

## 2020-10-18 LAB — HEPATIC FUNCTION PANEL
ALT: 22 U/L (ref 0–44)
AST: 41 U/L (ref 15–41)
Albumin: 1.8 g/dL — ABNORMAL LOW (ref 3.5–5.0)
Alkaline Phosphatase: 243 U/L — ABNORMAL HIGH (ref 38–126)
Bilirubin, Direct: 0.2 mg/dL (ref 0.0–0.2)
Indirect Bilirubin: 0.9 mg/dL (ref 0.3–0.9)
Total Bilirubin: 1.1 mg/dL (ref 0.3–1.2)
Total Protein: 7.5 g/dL (ref 6.5–8.1)

## 2020-10-18 LAB — PHOSPHORUS: Phosphorus: 5.3 mg/dL — ABNORMAL HIGH (ref 2.5–4.6)

## 2020-10-18 LAB — C-REACTIVE PROTEIN: CRP: 28.5 mg/dL — ABNORMAL HIGH (ref <1.0)

## 2020-10-18 LAB — SEDIMENTATION RATE: Sed Rate: 128 mm/hr — ABNORMAL HIGH (ref 0–16)

## 2020-10-18 MED ORDER — NOREPINEPHRINE 4 MG/250ML-% IV SOLN
0.0000 ug/min | INTRAVENOUS | Status: DC
Start: 2020-10-18 — End: 2020-10-19

## 2020-10-18 MED ORDER — VANCOMYCIN HCL 1500 MG/300ML IV SOLN
1500.0000 mg | Freq: Once | INTRAVENOUS | Status: DC
  Filled 2020-10-18: qty 300

## 2020-10-18 MED ORDER — VANCOMYCIN HCL IN DEXTROSE 750-5 MG/150ML-% IV SOLN
750.0000 mg | INTRAVENOUS | Status: DC
  Administered 2020-10-18: 750 mg via INTRAVENOUS
  Filled 2020-10-18 (×4): qty 150

## 2020-10-18 MED ORDER — MIDODRINE HCL 5 MG PO TABS
5.0000 mg | ORAL_TABLET | Freq: Three times a day (TID) | ORAL | Status: DC
  Administered 2020-10-18 – 2020-10-19 (×5): 5 mg
  Filled 2020-10-18 (×8): qty 1

## 2020-10-18 MED ORDER — PIPERACILLIN-TAZOBACTAM 3.375 G IVPB
3.3750 g | Freq: Three times a day (TID) | INTRAVENOUS | Status: DC
  Filled 2020-10-18: qty 50

## 2020-10-18 MED ORDER — PIPERACILLIN-TAZOBACTAM IN DEX 2-0.25 GM/50ML IV SOLN
2.2500 g | Freq: Three times a day (TID) | INTRAVENOUS | Status: DC
  Administered 2020-10-18 – 2020-10-21 (×10): 2.25 g via INTRAVENOUS
  Filled 2020-10-18 (×13): qty 50

## 2020-10-18 MED ORDER — MIDODRINE HCL 5 MG PO TABS: 5.0000 mg | ORAL_TABLET | Freq: Three times a day (TID) | ORAL | Status: DC

## 2020-10-18 MED ORDER — VANCOMYCIN HCL 1750 MG/350ML IV SOLN
1750.0000 mg | Freq: Once | INTRAVENOUS | Status: AC
  Administered 2020-10-18: 1750 mg via INTRAVENOUS
  Filled 2020-10-18 (×2): qty 350

## 2020-10-18 NOTE — Progress Notes (Signed)
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  Presented from [place] with AMS  hypotension and pneumonia resultant septic shock is admitted to the intensive care unit.  Has had multiple admissions in the past for Klebsiella, MRSA and Pseudomonas urinary tract infections and pneumonia.  His most recent admission 10/03/2020 with multiple drug-resistant Klebsiella and right ureteral calculus requiring double-J stent.  Renal replacement therapy was initiated 07/18/2020.       Assessment/ Plan:   1.Acute kidney Injury-prolonged dialysis dependency, now progressed to ESRD:  ATN from septic shock-without any evidence of renal recovery to date . initiated on CRRT 07/18/20 and transitioned to IHD.  Has TDC & AVG, has been tolerating hd with AVG.  Tolerated HD 10/4 but with minimal UF due to BP. HD today - will try for 2L UF, use profile 2, low temp and pre HD midodrine for BP support.  Try to avoid albumin as not available outpt.    2 Vascular access - had left forearm AVG placed 08/24/2020 but unfortunately had clotted.  s/p thrombectomy and revision 9/2. Tolerated  new cannulation on 9/28, used 10/4.  LUE edema continues to worsen and bruit decreased .  Unrevealing doppler US but concern for central stenosis and consult IR for angiogram - he is on eliquis which will need to be held for procedure; indication is Afib so will hold now.  Maintain TDC for now.     3 Septic shock/ventilator associated pneumonia due to recurrent Acinetobacter infection: now with worsening leukocytosis and low grade fever; sepsis eval/mgmt is underway   4 Leukocytosis: persistent, but rising per above; will defer to primary service for further mgmt   5 Chronic respiratory failure: Status post tracheostomy with ventilator management per CCM.   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   7 Chronic kidney disease/metabolic bone disease: Corr Calcium high - suspect immobility with low PTH - no ca supplements, low ca dialsate.  Phosphorus level under control with hemodialysis, off binders. PTH 26 on 10/1   8 Anemia: contiue ESA increased to 125mg starting 10/4, monitor Hb.  Transfuse as needed.    9 Acute metabolic encephalopathy: Continue management as above.,  Supportive care.   10 Disposition - pt cannot return to [facility] now that he is ESRD. [place] [facility] [place] [place] & his AVG needs to be mature prior to being accepted. Barrier is pt is a permanent trach & vent pt.    Subjective:   WBC up to 43k, T 99.  LUE remains swollen.  PEG tube issues yesterday   Objective:   BP (!) 90/52   Pulse 82   Temp 99 F (37.2 C) (Axillary)   Resp 20   Ht 5' 6"  (1.676 m)   Wt 78.6 kg   SpO2 97%   BMI 27.97 kg/m   Intake/Output Summary (Last 24 hours) at 10/18/2020 0752 Last data filed at 10/17/2020 1000 Gross per 24 hour  Intake 290 ml  Output 75 ml  Net 215 ml    Weight change: 1.8 kg  Physical Exam: General:NAD, tracheostomy on vent. Heart: normal rate, s1s2 nl Lungs: trach, no increased work of breathing. Abdomen:soft, Non-tender, PEG tube and ostomy bag Extremities: Trace lower extremities edema Neuro: awake Dialysis Access: RIJ TDC c/d/I. Lt FAL AVG decreased bruit and 2+ pitting edema noted of L  arm.  Has L sided power line in L chest.  Sutures intact at [facility] fossa incision.  No erythema or drainage noted at AVG.   Imaging: IR REPLACE G-TUBE SIMPLE WO FLUORO  Result Date: 10/17/2020 INDICATION: Patient with 20 Fr balloon retention G-tube originally placed in 2018 by general surgery at [facility] with frequent dislodgement and retention balloon protruding through skin. G-tube again dislodged and IR asked to replace at bedside. EXAM: BEDSIDE REPLACEMENT OF GASTROSTOMY TUBE COMPARISON:  None.  MEDICATIONS: None. FLUOROSCOPY TIME:  None COMPLICATIONS: None immediate. PROCEDURE: 20 Fr foley present in existing gastrostomy tube tract, 10 cc of normal saline removed from retention balloon and foley removed from tract. A 20 Fr Entuit balloon retention gastrostomy tube was lubricated and placed within the existing tract, gastric contents were aspirated to confirm positioning. The retention balloon was inflated with 20 cc of normal saline and pulled against the anterior inner lumen of the stomach, the external disc was cinched securely to the skin. KUB with contrast injection ordered to confirm appropriate placement prior to use. IMPRESSION: Successful bedside replacement of a new 20-French gastrostomy tube. Read by Candiss Norse, PA-C Electronically Signed   By: Miachel Roux M.D.   On: 10/17/2020 16:17   DG ABDOMEN PEG TUBE LOCATION  Result Date: 10/17/2020 CLINICAL DATA:  Peg tube location. EXAM: ABDOMEN - 1 VIEW COMPARISON:  Abdominal x-ray 09/28/2020. FINDINGS: Contrast is seen within nondilated stomach. PEG tube is located in the body of the stomach. No dilated bowel loops are seen. Right ureteral stent is partially visualized. IMPRESSION: 1. PEG tube is located in the body of the stomach. Electronically Signed   By: Ronney Asters M.D.   On: 10/17/2020 15:51   VAS US DUPLEX DIALYSIS ACCESS (AVF, AVG)  Result Date: 10/17/2020 DIALYSIS ACCESS Patient Name:  Luis Orr  Date of Exam:   10/17/2020 Medical Rec #: [id number]          Accession #:    [id number] Date of Birth: 04-25-1950         Patient Gender: M Patient Age:   33 years Exam Location:  [facility] Procedure:      VAS US DUPLEX DIALYSIS ACCESS (AVF, AVG) Referring Phys: Jannifer Hick --------------------------------------------------------------------------------  Reason for Exam: Increasing edema to left arm, reduced thrill. Access Site: Left Upper Extremity. Access Type: Forearm loop AVG brachial artery to brachial vein.  History: 09/11/2020 Left forearm brachial artery to vein AVG placement           09/16/2020 AV revision and thrombectomy. Limitations: Tissue properties/edema Comparison Study: No prior studies. Performing Technologist: Darlin Coco RDMS, RVT  Examination Guidelines: A complete evaluation includes B-mode imaging, spectral Doppler, color Doppler, and power Doppler as needed of all accessible portions of each vessel. Unilateral testing is considered an integral part of a complete examination. Limited examinations for reoccurring indications may be performed as noted.  Findings:   +--------------------+----------+-----------------+--------+ AVG                 PSV (cm/s)Flow Vol (mL/min)Describe +--------------------+----------+-----------------+--------+ Native artery inflow   270                              +--------------------+----------+-----------------+--------+ Arterial anastomosis   248          1285                +--------------------+----------+-----------------+--------+ Prox graft  96                              +--------------------+----------+-----------------+--------+ Mid graft               76                              +--------------------+----------+-----------------+--------+ Distal graft            71                              +--------------------+----------+-----------------+--------+ Venous anastomosis     137                              +--------------------+----------+-----------------+--------+ Venous outflow         116                              +--------------------+----------+-----------------+--------+  Summary: Arteriovenous graft-Velocities of less than 100cm/s noted in the prox, mid, and distal graft.  Patent left forearm AVG.  *See table(s) above for measurements and observations.  Diagnosing physician: Harold Barban MD Electronically signed by Harold Barban MD on 10/17/2020 at 4:53:43 PM.    --------------------------------------------------------------------------------   Final     Labs: BMET Recent Labs  Lab 10/15/20 0500  NA 131*  K 4.9  CL 91*  CO2 22  GLUCOSE 136*  BUN 108*  CREATININE 1.62*  CALCIUM 9.6  PHOS 6.0*    CBC Recent Labs  Lab 10/18/20 0357  WBC 43.3*  NEUTROABS 38.7*  HGB 8.1*  HCT 25.6*  MCV 75.5*  PLT 524*     Medications:     amiodarone  200 mg Per Tube Daily   apixaban  5 mg Per Tube BID   buPROPion  75 mg Per Tube BID   chlorhexidine gluconate (MEDLINE KIT)  15 mL Mouth Rinse BID   Chlorhexidine Gluconate Cloth  6 each Topical Q0600   darbepoetin (ARANESP) injection - DIALYSIS  150 mcg Intravenous Q Tue-HD   feeding supplement (PROSource TF)  45 mL Per Tube TID   guaiFENesin  5 mL Per Tube BID   insulin aspart  0-9 Units Subcutaneous Q4H   insulin glargine-yfgn  30 Units Subcutaneous Daily   lidocaine  1 patch Transdermal Q24H   mouth rinse  15 mL Mouth Rinse 10 times per day   metaxalone  800 mg Per Tube TID   midodrine  5 mg Per Tube TID WC   multivitamin  1 tablet Per Tube QHS   oxyCODONE  5 mg Per Tube Q4H   pantoprazole sodium  40 mg Per Tube Daily   sertraline  100 mg Per Tube Daily   simethicone  40 mg Per Tube QID   sodium chloride flush  10-40 mL Intracatheter Q12H      Justin Mend  10/18/2020, 7:52 AM

## 2020-10-18 NOTE — Progress Notes (Addendum)
[facility] PROGRESS NOTE  Luis Orr PPI:[id number] DOB: [birth year] DOA: 06/28/2020 PCP: Townsend Roger, MD  Status: Remains inpatient appropriate because:Hemodynamically unstable, Persistent severe electrolyte disturbances, Unsafe d/c plan, and Inpatient level of care appropriate due to severity of illness  Dispo: The patient is from: SNF-[facility] vent unit              Anticipated d/c is to: SNF Vent capable w/ access to in facility/stretcher based HD-a facility in [place] has been located in preauthorization in progress.              Patient currently is not medically stable to d/c.   Difficult to place patient Yes    Level of care: ICU  Code Status: Partial (no CPR and no defibrillation or cardioversion) Family Communication: Wife June at bedside on 9/21 DVT prophylaxis: Eliquis on hold for anticipated venogram COVID vaccination status: Unknown    HPI:  70 y.o. 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 Patient presented secondary to altered mental status and hypotension with recurrent pneumonia requiring vasopressor support and ICU admission.he has had multiple recent admissions for sepsis secondary to various infections including UTIs, pneumonias and bacteremia in setting of MDR Klebsiella, MRSA and pseudomonas. Most recent admission September 2021 in setting of sepsis due to MDR Klebsiella with right ureteral calculus s/p double J ureteral stent placement and treated with meropenem x 10days. He was discharged back to [facility].  ICU significant events: 6/29 admitted to ICU for septic shock on levo to maintain MAP>65, continued on vent support 6/29 Blood Cx>> staph species in 1 of 4 cultures drawn>> suspect contaminant 6/30 Off pressors 7/2 ID Consult for MDR acinetobacter in trach asp, Meropenem changed to unasyn 7/2 PRBC transfusion, iron replacement 7/5 Pressors added again. Central line placed.  Drainage from gastrostomy tube >> CT Abd without fluid collection around gastrostomy tube but with diffuse body wall edema. Echo EF 50-55%, mild LVH 7/6 Renal Replacement Therapy ; Code status changed to partial Code (DNR) 7/7 Transfuse 1U PRBC 7/9 add solu cortef 7/10 off pressors, weaning stress steroids 7/11 Pressors added back on again 7/12 ID consult due to rising WBC, hypothermia, hypotension concern for worsening infection. Vancomycin added. Repeat blood cx collected. 7/15 CRRT stopped 7/17 Restarted on low dose levo. No evidence of new infection. Held off on antibiotics. Nephrology contacted family and decision was made to continue iHD this week to allow additional time for renal recovery. Patient is not the best long term iHD candidate but family wants aggressive care 7/18 plans for iHD, tolerated with 2L removed 719 slight drop in hgb to 6.9 will transfuse 1 unit PRBC 7/22 > 7/26: Tolerating HD. TOC consulted for placement. Will likely need out of state placement. 8/15 WBCs greater than 28,000, hypoglycemia, procalcitonin >18, chest x-ray consistent with pneumonia, recent blood cultures consistent with contamination.  Unasyn IV resumed after sputum culture obtained 8/16 consultation placed with ethics committee regarding evaluation for futility of care 8/16 Sputum cx + for Acinetobacter with sensitivities pending 8/17 extensive conversation held with patient's wife regarding patient's poor prognosis and reemergence of Acinetobacter VAP.  Also discussed with her that patient has been able to convey to multiple staff that he does not want to continue on the ventilator or with dialysis.  Wife reports that the patient does not understand what he is saying, she is the POA and that she wants to continue aggressive care. 8/18 patient now telling nephrology,  myself and the rest of the staff he replies yes to wanting to remain on dialysis and on the ventilator and this direct question is  asked. 8/18 Acinetobacter sensitive to Unasyn only.  Discussed with ID/Dr. Juleen China.  Plan is to administer antibiotics for 7-day since patient has not had any increased O2 needs and remains afebrile.  If any concerns over worsening symptoms can give an additional 3 days. Developed moderate bleeding around tracheostomy site on 8/19.  Likely related to combination of Eliquis and heparin required for CRRT.  We will hold Eliquis for now and monitor degree of bleeding Eliquis resumed on 8/22.  Overnight into 8/23 patient bit his tongue and had bleeding.  Also had some bleeding around trach likely from oral bleeding.  Also though did have bleeding from old central line site. 8/23 had transient bleeding around trach and prior central line insertion site overnight.  Patient had bitten tongue and this likely explain the bleeding.  This morning patient was not having any bleeding.  On 8/25 had redeveloped slow ooze bleeding from mouth and trach.  Eliquis discontinued in favor of IV heparin especially given patient will undergo surgical procedure for vascular access on 8/29 8/30 Left FA AVG occluded 9/2 status post thrombectomy and revision of LUE AV graft 9/4 LUE edema with negative venous duplex. 9/5 heparin discontinued and Eliquis resumed 10/4 PEG tube partially dislodged in IR replaced at bedside 10/5 PEG dislodged again and replaced by IR 10/6 Eliquis on hold for IR Venogram     Subjective: Patient sleeping with suboptimal blood pressure readings.  Once awake and SBP back up to 90.  RN and nephrology provider Dr. Johnney Ou at bedside.  All agree that LUE exam not consistent with infection but there are concerns about proximal vein stenosis.  Dr. Johnney Ou states okay to allow MAP to be >/= 60  Objective: Vitals:   10/18/20 0610 10/18/20 0700  BP: (!) 101/53 (!) 90/52  Pulse:  82  Resp:  20  Temp:    SpO2:  97%    Intake/Output Summary (Last 24 hours) at 10/18/2020 0757 Last data filed at 10/17/2020  1000 Gross per 24 hour  Intake 290 ml  Output 75 ml  Net 215 ml   Filed Weights   10/16/20 0136 10/17/20 0400 10/18/20 0500  Weight: 77.8 kg 76.8 kg 78.6 kg    Exam:  Constitutional: [place], continues with bland affect.  No response with palpation of abdomen or arm-axillary temp 99.3 consistent with oral temp of 100.3 Respiratory: Trach: 6.0 XLT cuffed, PRVC mode, FiO2 40% -rate 20-PEEP 5; lung sounds remain clear.  Frothy secretions noted from mouth.  RN states frothy secretions suctioned from trach regularly and large-volume-2 saturations 96-97 Cardiovascular: BP remains labile, no tachycardia, heart sounds remain normal Abdomen: LBM 10/05, PEG tube in place and confirmed with x-ray, after nontender left lower quadrant colostomy with liquid stool. Neurologic: Chronic quadriplegia. No sensation below shoulders. Psychiatric: Awake, and flat affect.  Appears depressed.   Assessment/Plan: Acute problems: Suspected sepsis WBC has dramatically increased to 43.3, patient with more labile blood pressure over the past 24 hours, low-grade temperature 100.3 Blood cultures have been obtained, will also check CRP, ESR and respiratory cultures.  We will also check LFTs given low blood pressure reading Nephrology team has stated okay to except an MAP >/= 60 so at this juncture will not initiate Levophed Continue empiric Zosyn and vancomycin At this juncture source unclear Does not make urine so do not suspect UTI  Acute on chronic respiratory failure with hypoxia and hypercapnia Chronic vent 2/2 quadraplegia - ventilator management per PCCM Failed recent SBT secondary to apnea  Recurrent ventilator associated pneumonia 2/2 recurrent Acinetobacter/sepsis without shock Has been treated x2 this admission for Acinetobacter tracheobronchitis/pneumonia   Persistent LUE edema Previous imaging for SVT/DVT unremarkable Nephrology concerned since it has not resolved with elevation therefore have  consulted IR for more detailed imaging ( ?  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  IR replaced at bedside and KUB confirms appropriate placement  Pressure injury/stage IV sacral decubitus Stage IV medial/posterior sacrum present on admission.  Right/posterior/lateral ankle unknown if present on admission.  DTI left ear, not present on admission. (For additional documentation see wound care notes) Reevaluated by wound care nurse.  Dressings changed to saline moistened twice daily to help prevent dressing from drying out and added to ease of removal of said dressing to minimize bleeding   Septic shock 2/2 VAP POA resolved  Chronic thoracic back pain Continue scheduled low-dose Oxy IR along, IV morphine prn, Skelaxin and lidocaine patch   History of paroxysmal atrial fibrillation Continue amiodarone -Eliquis on hold (10/6) 9/30 QTC 385 ms   Acute metabolic encephalopathy Resolved  Diabetes mellitus, type 2 Continue Semglee 30 units daily.  Continue SSI w/ CBG checks every 4 hours  Acute diverticulitis Resolved after treatment with Flagyl  Nodular irregular thickening of the ascending colon-?   Neoplasm [facility] consulted.  They discussed with his wife.  Given patient's debilitated state/diverticulitis they recommended holding off on colonoscopy for now.  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  As needed dosing continued  Quadriplegia secondary to compressive myelopathy.  Plan is to discharge to facility in [place]   Microcytic anemia/Acute anemia superimposed on anemia of chronic kidney disease S/P 6 units of PRBC -Continue Aranesp; Hb average between 8 and 9      Data Reviewed: Basic Metabolic Panel: Recent Labs  Lab 10/15/20 0500  NA 131*  K 4.9  CL 91*  CO2 22  GLUCOSE 136*  BUN 108*  CREATININE 1.62*  CALCIUM 9.6  PHOS 6.0*   Liver Function Tests: Recent Labs  Lab 10/15/20 0500  ALBUMIN 1.7*    CBC: Recent Labs  Lab 10/18/20 0357  WBC 43.3*  NEUTROABS 38.7*  HGB 8.1*  HCT 25.6*  MCV 75.5*  PLT 524*   CBG: Recent Labs  Lab 10/17/20 1228 10/17/20 1724 10/17/20 1954 10/17/20 2319 10/18/20 0337  GLUCAP 113* 84 72 106* 178*  Scheduled Meds:  amiodarone  200 mg Per Tube Daily   buPROPion  75 mg Per Tube BID   chlorhexidine gluconate (MEDLINE KIT)  15 mL Mouth Rinse BID   Chlorhexidine Gluconate Cloth  6 each Topical Q0600   darbepoetin (ARANESP) injection - DIALYSIS  150 mcg Intravenous Q Tue-HD   feeding supplement (PROSource TF)  45 mL Per Tube TID   guaiFENesin  5 mL Per Tube BID   insulin aspart  0-9 Units Subcutaneous Q4H   insulin glargine-yfgn  30 Units Subcutaneous Daily   lidocaine  1 patch Transdermal Q24H   mouth rinse  15 mL Mouth Rinse 10 times per day   metaxalone  800 mg  Per Tube TID   midodrine  5 mg Per Tube TID WC   multivitamin  1 tablet Per Tube QHS   oxyCODONE  5 mg Per Tube Q4H   pantoprazole sodium  40 mg Per Tube Daily   sertraline  100 mg Per Tube Daily   simethicone  40 mg Per Tube QID   sodium chloride flush  10-40 mL Intracatheter Q12H   Continuous Infusions:  sodium chloride 500 mL (09/01/20 1408)   feeding supplement (NEPRO CARB STEADY) 45 mL/hr at 10/17/20 2030   norepinephrine (LEVOPHED) Adult infusion Stopped (10/18/20 0721)   vancomycin      Principal Problem:   Acute respiratory failure with hypoxia (HCC) Active Problems:   Ventilator dependent (HCC)   Sacral decubitus ulcer   Septic shock (HCC)   Pressure injury of skin   AKI (acute kidney injury) ([place])   Hypotension   Goals of care, counseling/discussion   Quadriplegia ([place])   Chronic kidney disease requiring chronic dialysis ([place])   Hyperglycemia   Sepsis due to Acinetobacter baumannii ([place])   Pneumonia due to Acinetobacter species ([place])   Dysautonomia ([place])   Chronic midline thoracic back pain   Abdominal distention   Leukocytosis   Consultants: PCCM Nephrology Infectious disease Palliative care medicine  Procedures: Echocardiogram Insertion of double-lumen HD catheter  Antibiotics: Cefepime x1 dose Meropenem 6/29 through 7/2 Vancomycin 6/29 through 6/30 Unasyn 7/2 through 7/15 Unasyn 8/15 >8/22   Time spent: 25 minutes    Erin Hearing ANP  [facility] 7 am - 330 pm/M-F for direct patient care and secure chat Please refer to Amion for contact info 99  days

## 2020-10-18 NOTE — Progress Notes (Signed)
Nutrition Follow-up  DOCUMENTATION CODES:   Not applicable  INTERVENTION:   Continue tube feeds via G-tube: Nepro at 45 ml/hr (1080 ml/day) ProSource TF 45 ml TID  Provides 2064 kcal, 120 grams of protein, and 785 ml free water daily.  Continue renal MVI daily per tube.  NUTRITION DIAGNOSIS:   Increased nutrient needs related to wound healing as evidenced by estimated needs.  Ongoing  GOAL:   Patient will meet greater than or equal to 90% of their needs  Met with TF  MONITOR:   Vent status, Labs, Weight trends, TF tolerance, Skin, I & O's  REASON FOR ASSESSMENT:   Ventilator, Consult Enteral/tube feeding initiation and management  ASSESSMENT:   70 year old male who presented to the ED on 6/29 from [facility] with AMS and hypotension. 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 Pt admitted with septic shock, acute on chronic hypoxemic respiratory failure.  G-tube has been dislodged twice over the past few days. Balloon retention G-tube was placed 10/5 and TF has been resumed. Patient is receiving Nepro at 45 ml/h with Prosource TF 45 ml TID. IR reevaluated G-tube today d/t leaking around tube and removed 5 ml NS from retention balloon.    Currently receiving iHD via AVG on TTS schedule.  Last HD today (9/29) with 1.5 L UF.   Admit weight: 90.2 kg Current weight: 78.6 kg Lowest weight since admit: 72 kg on 8/3  Patient remains on ventilator support via trach. MV: 10.4 L/min Temp (24hrs), Avg:98.4 F (36.9 C), Min:98.2 F (36.8 C), Max:99 F (37.2 C)  Medications reviewed and include: aranesp injection weekly, Novolog SSI q 4 hours, semglee 30 units daily, rena-vit, protonix.  Labs reviewed: Na 130 CBG: [phone]  Colostomy: 75 ml x 24 hours UOP: 0 ml x 24 hours I/O +21 L since  admission  Diet Order:   Diet Order     None       EDUCATION NEEDS:   No education needs have been identified at this time  Skin:  Skin Assessment: Skin Integrity Issues: DTI: L ear Stage IV: sacrum Stage II: R heel  Last BM:  10/6 colostomy  Height:   Ht Readings from Last 1 Encounters:  09/24/20 5' 6"  (1.676 m)    Weight:   Wt Readings from Last 1 Encounters:  10/18/20 78.6 kg    Ideal Body Weight:  58 kg (adjusted for quadriplegia)  BMI:  Body mass index is 27.97 kg/m.  Estimated Nutritional Needs:   Kcal:  1900-2100  Protein:  110-120 gm  Fluid:  1 L + UOP   Lucas Mallow, RD, LDN, CNSC Please refer to Amion for contact information.

## 2020-10-18 NOTE — Progress Notes (Signed)
IR was requested for g tube evaluation due to tube feed leakage from the insertions site.   IR was consulted for replacement of dislodged G tube yesterday, and a 20 Fr Entuit balloon retention G-tube was placed at bedside yesterday with no issue.  The retention balloon was inflated with 20 cc NS yesterday.   Today upon evaluation, moderate amount of tube feed leakage noted from the insertion site. Site otherwise clean, no s/s of acute infection. The insertion site did appear to be bigger than the G tube itself.   5 cc NS was taken out from the retention balloon today due to concern for the retention balloon obstruction gastric outflow causing tube feed to back out.  A split gauze was placed under the retention bumper, the bumper was cinched to skin with no issues.  No immediate tube feed leakage noted.  If the leakage continues, my require further intervention including additional imaging  study and/or G tube upsizing.     Please call IR for questions and concerns.   Luis Orr H Luis Torpey PA-C 10/18/2020 1:50 PM

## 2020-10-18 NOTE — Progress Notes (Signed)
Pharmacy Antibiotic Note  Luis Orr is a 70 y.o. male admitted on 06/16/2020 with AMS and hypotension.  Patient has an extensive antibiotic use since admission for PNA.  He has significant leukocytosis today with labile BP.  Pharmacy has been consulted for vancomycin and Zosyn dosing for sepsis.  He has ESRD on TTS HD.  HD today.  Afebrile, WBC up to 43.3.  Plan: Vanc '1750mg'$  IV x 1, then '750mg'$  IV qHD TTS Zosyn 2.25gm IV Q8H Monitor HD schedule/tolerance, clinical progress, vanc level as indicated   Height: '5\' 6"'$  (167.6 cm) Weight: 78.6 kg (173 lb 4.5 oz) IBW/kg (Calculated) : 63.8  Temp (24hrs), Avg:98.6 F (37 C), Min:98.2 F (36.8 C), Max:99.1 F (37.3 C)  Recent Labs  Lab 10/15/20 0500 10/18/20 0357  WBC  --  43.3*  CREATININE 1.62*  --     Estimated Creatinine Clearance: 41.8 mL/min (A) (by C-G formula based on SCr of 1.62 mg/dL (H)).    Allergies  Allergen Reactions   Atenolol     Other reaction(s): Weakness present   Fosinopril Cough   Plendil [Felodipine]    Adhesive [Tape] Rash    CTX (unk start date) >> 6/29; 9/12-9/13 Tobi nebs 6/8 >> 6/29 Cefepime x1 6/29 Vanc 6/29 >> 7/14, restarted 10/6 >>  Zosyn 10/6 >> Meropenem 6/29 >> 7/2 Unasyn 7/2 >> 7/15; 8/15 >> 8/21 Flagyl 9/12 >> 9/13   VR 6/30 = 18 (14 hrs after one time dose 1500 mg) VT 7/1 prior to 3rd - 34, accumulating/not clearing despite UOP   6/29 MRSA PCR positive 6/29 BCx 1/4 Staph species (BCID Staph species) 6/29 UCx - suggest recollection 6/30 TA - Acinetobacter (MIC 8 Unasyn, otherwise resistant) 7/1 UCx - mult species 7/12 BCx: negative 8/14 Bcx: 1/3 Staph epi 8/15 TA: Acinetobacter - only S to Unasyn (MIC 4)  9/12 UCx: ordered (does not produce urine) 9/16 GI panel - negative 10/6 BCx -  10/6 TA -   Luis Orr D. Mina Marble, PharmD, BCPS, [place] 10/18/2020, 9:29 AM

## 2020-10-18 NOTE — Progress Notes (Signed)
[place] Progress Note Patient Name: Luis Orr DOB: 10/11/1950 MRN: [id number]   Date of Service  10/18/2020  HPI/Events of Note  Pt's MAP's have been soft; use of the PRN midodrine doses did not resolve issue.  Also, pt's WBC jumped to 43  LUE still appears quite edematous  eICU Interventions  Will presume he's becoming septic due to the jump in WBC.  No clear source yet identified (other than the LUE possibly)  - blood cults - new CXR - levo if MAP drops below 65 again     Intervention Category Major Interventions: Hypotension - evaluation and management  Tilden Dome 10/18/2020, 6:24 AM

## 2020-10-19 ENCOUNTER — Inpatient Hospital Stay (HOSPITAL_COMMUNITY): Payer: 59

## 2020-10-19 DIAGNOSIS — J9601 Acute respiratory failure with hypoxia: Secondary | ICD-10-CM | POA: Diagnosis not present

## 2020-10-19 DIAGNOSIS — B965 Pseudomonas (aeruginosa) (mallei) (pseudomallei) as the cause of diseases classified elsewhere: Secondary | ICD-10-CM | POA: Diagnosis not present

## 2020-10-19 DIAGNOSIS — R7881 Bacteremia: Secondary | ICD-10-CM | POA: Diagnosis not present

## 2020-10-19 HISTORY — PX: IR REMOVAL TUN CV CATH W/O FL: IMG2289

## 2020-10-19 HISTORY — PX: IR FLUORO GUIDE CV LINE LEFT: IMG2282

## 2020-10-19 LAB — BLOOD CULTURE ID PANEL (REFLEXED) - BCID2

## 2020-10-19 LAB — BASIC METABOLIC PANEL
Anion gap: 16 — ABNORMAL HIGH (ref 5–15)
BUN: 58 mg/dL — ABNORMAL HIGH (ref 8–23)
CO2: 23 mmol/L (ref 22–32)
Calcium: 9.2 mg/dL (ref 8.9–10.3)
Chloride: 91 mmol/L — ABNORMAL LOW (ref 98–111)
Creatinine, Ser: 1.12 mg/dL (ref 0.61–1.24)
GFR, Estimated: >60 mL/min (ref >60)
Glucose, Bld: 221 mg/dL — ABNORMAL HIGH (ref 70–99)
Potassium: 3.7 mmol/L (ref 3.5–5.1)
Sodium: 130 mmol/L — ABNORMAL LOW (ref 135–145)

## 2020-10-19 LAB — CBC
HCT: 30.4 % — ABNORMAL LOW (ref 39.0–52.0)
Hemoglobin: 9.6 g/dL — ABNORMAL LOW (ref 13.0–17.0)
MCH: 24 pg — ABNORMAL LOW (ref 26.0–34.0)
MCHC: 31.6 g/dL (ref 30.0–36.0)
MCV: 76 fL — ABNORMAL LOW (ref 80.0–100.0)
Platelets: 596 10*3/uL — ABNORMAL HIGH (ref 150–400)
RBC: 4 MIL/uL — ABNORMAL LOW (ref 4.22–5.81)
RDW: 21.2 % — ABNORMAL HIGH (ref 11.5–15.5)
WBC: 32.2 10*3/uL — ABNORMAL HIGH (ref 4.0–10.5)
nRBC: 3.6 % — ABNORMAL HIGH (ref 0.0–0.2)

## 2020-10-19 LAB — GLUCOSE, CAPILLARY
Glucose-Capillary: 171 mg/dL — ABNORMAL HIGH (ref 70–99)
Glucose-Capillary: 191 mg/dL — ABNORMAL HIGH (ref 70–99)
Glucose-Capillary: 201 mg/dL — ABNORMAL HIGH (ref 70–99)
Glucose-Capillary: 185 mg/dL — ABNORMAL HIGH (ref 70–99)
Glucose-Capillary: 231 mg/dL — ABNORMAL HIGH (ref 70–99)
Glucose-Capillary: 243 mg/dL — ABNORMAL HIGH (ref 70–99)

## 2020-10-19 LAB — OCCULT BLOOD X 1 CARD TO LAB, STOOL: Fecal Occult Bld: POSITIVE — AB

## 2020-10-19 MED ORDER — LIDOCAINE HCL 1 % IJ SOLN
INTRAMUSCULAR | Status: AC
  Administered 2020-10-19: 1 mg
  Filled 2020-10-19: qty 20

## 2020-10-19 MED ORDER — CHLORHEXIDINE GLUCONATE 4 % EX LIQD
CUTANEOUS | Status: AC
  Filled 2020-10-19: qty 15

## 2020-10-19 NOTE — Progress Notes (Signed)
PHARMACY - PHYSICIAN COMMUNICATION CRITICAL VALUE ALERT - BLOOD CULTURE IDENTIFICATION (BCID)  Luis Orr is an 70 y.o. male who presented to [facility] on 07/03/2020 with a chief complaint of hypotension/pneumonia  Assessment: Prolonged hospital stay  Name of physician (or Provider) Contacted: Dr. Oletta Darter  Current antibiotics: Vancomycin/Zosyn  Changes to prescribed antibiotics recommended:  None for now Consider DC vancomycin soon  Results for orders placed or performed during the hospital encounter of 06/24/2020  Blood Culture ID Panel (Reflexed) (Collected: 10/18/2020  6:40 AM)  Result Value Ref Range   Enterococcus faecalis NOT DETECTED NOT DETECTED   Enterococcus Faecium NOT DETECTED NOT DETECTED   Listeria monocytogenes NOT DETECTED NOT DETECTED   Staphylococcus species NOT DETECTED NOT DETECTED   Staphylococcus aureus (BCID) NOT DETECTED NOT DETECTED   Staphylococcus epidermidis NOT DETECTED NOT DETECTED   Staphylococcus lugdunensis NOT DETECTED NOT DETECTED   Streptococcus species NOT DETECTED NOT DETECTED   Streptococcus agalactiae NOT DETECTED NOT DETECTED   Streptococcus pneumoniae NOT DETECTED NOT DETECTED   Streptococcus pyogenes NOT DETECTED NOT DETECTED   A.calcoaceticus-baumannii NOT DETECTED NOT DETECTED   Bacteroides fragilis NOT DETECTED NOT DETECTED   Enterobacterales NOT DETECTED NOT DETECTED   Enterobacter cloacae complex NOT DETECTED NOT DETECTED   Escherichia coli NOT DETECTED NOT DETECTED   Klebsiella aerogenes NOT DETECTED NOT DETECTED   Klebsiella oxytoca NOT DETECTED NOT DETECTED   Klebsiella pneumoniae NOT DETECTED NOT DETECTED   Proteus species NOT DETECTED NOT DETECTED   Salmonella species NOT DETECTED NOT DETECTED   Serratia marcescens NOT DETECTED NOT DETECTED   Haemophilus influenzae NOT DETECTED NOT DETECTED   Neisseria meningitidis NOT DETECTED NOT DETECTED   Pseudomonas aeruginosa DETECTED (A) NOT DETECTED   Stenotrophomonas  maltophilia NOT DETECTED NOT DETECTED   Candida albicans NOT DETECTED NOT DETECTED   Candida auris NOT DETECTED NOT DETECTED   Candida glabrata NOT DETECTED NOT DETECTED   Candida krusei NOT DETECTED NOT DETECTED   Candida parapsilosis NOT DETECTED NOT DETECTED   Candida tropicalis NOT DETECTED NOT DETECTED   Cryptococcus neoformans/gattii NOT DETECTED NOT DETECTED   CTX-M ESBL NOT DETECTED NOT DETECTED   Carbapenem resistance IMP NOT DETECTED NOT DETECTED   Carbapenem resistance KPC NOT DETECTED NOT DETECTED   Carbapenem resistance NDM NOT DETECTED NOT DETECTED   Carbapenem resistance VIM NOT DETECTED NOT DETECTED   Narda Bonds, PharmD, BCPS Clinical Pharmacist Phone: [phone]

## 2020-10-19 NOTE — Progress Notes (Signed)
[facility] Progress Note   70 year old gentleman with C3-C5 compressive myelopathy and subsequent quadriplegia chronically on trach and vent.  Presented from [facility] with AMS  hypotension and pneumonia resultant septic shock is admitted to the intensive care unit.  Has had multiple admissions in the past for Klebsiella, MRSA and Pseudomonas urinary tract infections and pneumonia.  His most recent admission 10/03/2020 with multiple drug-resistant Klebsiella and right ureteral calculus requiring double-J stent.  Renal replacement therapy was initiated 07/18/2020.       Assessment/ Plan:   1.Acute kidney Injury-prolonged dialysis dependency, now progressed to ESRD:  ATN from septic shock-without any evidence of renal recovery to date . initiated on CRRT 07/18/20 and transitioned to IHD.  Has TDC & AVG, has been tolerating hd with AVG.  HD yesterday with 2.5L UF tolerated with profile 2, low temp and pre HD midodrine for BP support.  Try to avoid albumin as not available outpt.    2 Vascular access - had left forearm AVG placed 09/09/2020 but unfortunately had clotted.  s/p thrombectomy and revision 9/2. Tolerated  new cannulation on 9/28, used 10/4 but not used 10/6 due to arm swelling.  For IR shuntogram today to eval central stenosis;  in light of bacteremia asked IR to remove [facility] if the access study shows patent graft circuit.  If the AVG is not useable we would need to proceed with a new line but it has been successfully used.     3 Septic shock/ventilator associated pneumonia due to recurrent Acinetobacter infection: now with worsening leukocytosis and low grade fever with +blood cultures pseudomonas now; ID to be consulted.  4 Chronic respiratory failure: Status post tracheostomy with ventilator management per CCM.   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   6 Chronic kidney disease/metabolic bone disease: Corr Calcium  high - suspect immobility with low PTH - no ca supplements, low ca dialsate.  Phosphorus level under control with hemodialysis, off binders. PTH 26 on 10/1   7 Anemia: contiue ESA increased to 187mg starting 10/4, monitor Hb.  Transfuse as needed.    8 Acute metabolic encephalopathy: Continue management as above.,  Supportive care. 1 0Disposition - pt cannot return to [facility] now that he is ESRD. [place] & his AVG needs to be mature prior to being accepted. Barrier is pt is a permanent trach & vent pt.    Subjective:   PsA in blood cultures detected: on vanc/zosyn.  LUE remains swollen.  PEG tube issues yesterday - sounds like needs upsize. Tol HD 2.5L UF yesterday, TDC was used.   Objective:   BP 138/76   Pulse 100   Temp 98.1 F (36.7 C) (Oral)   Resp 20   Ht 5' 6"  (1.676 m)   Wt 79.2 kg   SpO2 99%   BMI 28.18 kg/m   Intake/Output Summary (Last 24 hours) at 10/19/2020 0859 Last data filed at 10/19/2020 0836 Gross per 24 hour  Intake 2667.73 ml  Output 3050 ml  Net -382.27 ml    Weight change: 0 kg  Physical Exam: General:NAD, tracheostomy on vent. Heart: normal rate, s1s2 nl Lungs: trach, no increased work of breathing. Abdomen:soft, Non-tender, PEG tube dressing noted (bloody drainage per primary) and ostomy bag Extremities: Trace lower extremities edema Neuro: awake Dialysis Access: RIJ TDC c/d/I. Lt FAL AVG decreased bruit and 2+ pitting edema noted of L  arm.  Has L sided power line in L chest.  Sutures intact at [facility] fossa incision.  No erythema or drainage noted at AVG.   Imaging: IR REPLACE G-TUBE SIMPLE WO FLUORO  Result Date: 10/17/2020 INDICATION: Patient with 20 Fr balloon retention G-tube originally placed in 2018 by general surgery at [facility] with frequent dislodgement and retention balloon protruding through skin. G-tube again dislodged and IR asked to replace at bedside. EXAM: BEDSIDE  REPLACEMENT OF GASTROSTOMY TUBE COMPARISON:  None. MEDICATIONS: None. FLUOROSCOPY TIME:  None COMPLICATIONS: None immediate. PROCEDURE: 20 Fr foley present in existing gastrostomy tube tract, 10 cc of normal saline removed from retention balloon and foley removed from tract. A 20 Fr Entuit balloon retention gastrostomy tube was lubricated and placed within the existing tract, gastric contents were aspirated to confirm positioning. The retention balloon was inflated with 20 cc of normal saline and pulled against the anterior inner lumen of the stomach, the external disc was cinched securely to the skin. KUB with contrast injection ordered to confirm appropriate placement prior to use. IMPRESSION: Successful bedside replacement of a new 20-French gastrostomy tube. Read by Candiss Norse, PA-C Electronically Signed   By: Miachel Roux M.D.   On: 10/17/2020 16:17   DG ABDOMEN PEG TUBE LOCATION  Result Date: 10/17/2020 CLINICAL DATA:  Peg tube location. EXAM: ABDOMEN - 1 VIEW COMPARISON:  Abdominal x-ray 09/28/2020. FINDINGS: Contrast is seen within nondilated stomach. PEG tube is located in the body of the stomach. No dilated bowel loops are seen. Right ureteral stent is partially visualized. IMPRESSION: 1. PEG tube is located in the body of the stomach. Electronically Signed   By: Ronney Asters M.D.   On: 10/17/2020 15:51   DG CHEST PORT 1 VIEW  Result Date: 10/18/2020 CLINICAL DATA:  Acute respiratory failure with hypoxia. EXAM: PORTABLE CHEST 1 VIEW COMPARISON:  October 08, 2020. FINDINGS: The heart size and mediastinal contours are within normal limits. Tracheostomy tube is in good position. Left-sided PICC line is unchanged in position. Right internal jugular dialysis catheter is unchanged. Stable bibasilar atelectasis or infiltrates are noted with possible small pleural effusions. The visualized skeletal structures are unremarkable. IMPRESSION: Stable support apparatus. Stable bibasilar opacities as  described above. Electronically Signed   By: Marijo Conception M.D.   On: 10/18/2020 09:44   VAS US DUPLEX DIALYSIS ACCESS (AVF, AVG)  Result Date: 10/17/2020 DIALYSIS ACCESS Patient Name:  Luis Orr  Date of Exam:   10/17/2020 Medical Rec #: [id number]          Accession #:    [id number] Date of Birth: 09/23/1950         Patient Gender: M Patient Age:   15 years Exam Location:  [facility] Procedure:      VAS US DUPLEX DIALYSIS ACCESS (AVF, AVG) Referring Phys: Jannifer Hick --------------------------------------------------------------------------------  Reason for Exam: Increasing edema to left arm, reduced thrill. Access Site: Left Upper Extremity. Access Type: Forearm loop AVG brachial artery to brachial vein. History: 09/02/2020 Left forearm brachial artery to vein AVG placement           09/30/2020 AV revision and thrombectomy. Limitations: Tissue properties/edema Comparison Study: No prior studies. Performing Technologist: Darlin Coco RDMS, RVT  Examination Guidelines: A complete evaluation includes B-mode imaging, spectral Doppler, color Doppler, and power Doppler as needed of all accessible portions of each vessel. Unilateral testing is considered an integral part of a complete examination. Limited examinations for reoccurring indications may be performed as  noted.  Findings:   +--------------------+----------+-----------------+--------+ AVG                 PSV (cm/s)Flow Vol (mL/min)Describe +--------------------+----------+-----------------+--------+ Native artery inflow   270                              +--------------------+----------+-----------------+--------+ Arterial anastomosis   248          1285                +--------------------+----------+-----------------+--------+ Prox graft              96                              +--------------------+----------+-----------------+--------+ Mid graft               76                               +--------------------+----------+-----------------+--------+ Distal graft            71                              +--------------------+----------+-----------------+--------+ Venous anastomosis     137                              +--------------------+----------+-----------------+--------+ Venous outflow         116                              +--------------------+----------+-----------------+--------+  Summary: Arteriovenous graft-Velocities of less than 100cm/s noted in the prox, mid, and distal graft.  Patent left forearm AVG.  *See table(s) above for measurements and observations.  Diagnosing physician: Harold Barban MD Electronically signed by Harold Barban MD on 10/17/2020 at 4:53:43 PM.   --------------------------------------------------------------------------------   Final     Labs: BMET Recent Labs  Lab 10/15/20 0500 10/18/20 0638  NA 131* 131*  K 4.9 3.7  CL 91* 92*  CO2 22 22  GLUCOSE 136* 175*  BUN 108* 94*  CREATININE 1.62* 1.64*  CALCIUM 9.6 9.2  PHOS 6.0* 5.3*    CBC Recent Labs  Lab 10/18/20 0357  WBC 43.3*  NEUTROABS 38.7*  HGB 8.1*  HCT 25.6*  MCV 75.5*  PLT 524*     Medications:     amiodarone  200 mg Per Tube Daily   buPROPion  75 mg Per Tube BID   chlorhexidine gluconate (MEDLINE KIT)  15 mL Mouth Rinse BID   Chlorhexidine Gluconate Cloth  6 each Topical Q0600   darbepoetin (ARANESP) injection - DIALYSIS  150 mcg Intravenous Q Tue-HD   feeding supplement (PROSource TF)  45 mL Per Tube TID   guaiFENesin  5 mL Per Tube BID   insulin aspart  0-9 Units Subcutaneous Q4H   insulin glargine-yfgn  30 Units Subcutaneous Daily   lidocaine  1 patch Transdermal Q24H   mouth rinse  15 mL Mouth Rinse 10 times per day   metaxalone  800 mg Per Tube TID   midodrine  5 mg Per Tube TID WC   multivitamin  1 tablet Per Tube QHS   oxyCODONE  5 mg Per Tube Q4H  pantoprazole sodium  40 mg Per Tube Daily   sertraline  100 mg Per Tube Daily    simethicone  40 mg Per Tube QID   sodium chloride flush  10-40 mL Intracatheter Q12H      Justin Mend  10/19/2020, 8:59 AM

## 2020-10-19 NOTE — Progress Notes (Signed)
RT NOTES: Transported patient to IR and back to room 2M16 on vent without complications.

## 2020-10-19 NOTE — Progress Notes (Signed)
[facility]    Date of Admission:  06/17/2020     Reason for consult: PsA bacteremia  ID: Luis Orr is a 70 y.o. male with  hx of C3-C5 compressive myelopathy, quadriplegia s/p chronic trach/vent. Hx of MDRO klebesiella, MRSA, and PsA infection. He was admitted on 6/29 for sepsis . He was treated for MDRO acinetobacter pneumonia.  Has been at hospital since late June, now awaiting placement. While doing weekly labs found leukocytosis of 43K which prompted infectious work up. Blood cx + PsA. Patient has remained afebrile. GNR found in 2 sets of blood cx   Principal Problem:   Acute respiratory failure with hypoxia (HCC) Active Problems:   Ventilator dependent (HCC)   Sacral decubitus ulcer   Septic shock (HCC)   Pressure injury of skin   AKI (acute kidney injury) (HCC)   Hypotension   Goals of care, counseling/discussion   Quadriplegia (HCC)   Chronic kidney disease requiring chronic dialysis ([place])   Hyperglycemia   Sepsis due to Acinetobacter baumannii ([place])   Pneumonia due to Acinetobacter species ([place])   Dysautonomia (HCC)   Chronic midline thoracic back pain   Abdominal distention   Leukocytosis   Bacteremia due to Pseudomonas    Subjective: afebrile, non-verbal. Hemodynamically stable. Stable vent settings   Medications:   amiodarone  200 mg Per Tube Daily   buPROPion  75 mg Per Tube BID   chlorhexidine gluconate (MEDLINE KIT)  15 mL Mouth Rinse BID   Chlorhexidine Gluconate Cloth  6 each Topical Q0600   darbepoetin (ARANESP) injection - DIALYSIS  150 mcg Intravenous Q Tue-HD   feeding supplement (PROSource TF)  45 mL Per Tube TID   guaiFENesin  5 mL Per Tube BID   insulin aspart  0-9 Units Subcutaneous Q4H   insulin glargine-yfgn  30 Units Subcutaneous Daily   lidocaine  1 patch Transdermal Q24H   mouth rinse  15 mL Mouth Rinse 10 times per day   metaxalone  800 mg Per Tube TID   midodrine  5 mg Per Tube TID WC    multivitamin  1 tablet Per Tube QHS   oxyCODONE  5 mg Per Tube Q4H   pantoprazole sodium  40 mg Per Tube Daily   sertraline  100 mg Per Tube Daily   simethicone  40 mg Per Tube QID   sodium chloride flush  10-40 mL Intracatheter Q12H    Objective: Vital signs in last 24 hours: Temp:  [97.5 F (36.4 C)-98.3 F (36.8 C)] 98.3 F (36.8 C) (10/07 1128) Pulse Rate:  [84-108] 96 (10/07 1514) Resp:  [20-22] 20 (10/07 1514) BP: (89-161)/(60-94) 95/64 (10/07 1514) SpO2:  [94 %-100 %] 97 % (10/07 1514) FiO2 (%):  [30 %] 30 % (10/07 1514) Weight:  [76.1 kg-79.2 kg] 79.2 kg (10/07 0500) Physical Exam  Constitutional: opens eyes to verbal stimuli.  He appears chronically ill and well-nourished. No distress.  HENT:  Mouth/Throat: Oropharynx is clear and moist. No oropharyngeal exudate. Trach in place with secretions Cardiovascular: Normal rate, regular rhythm and normal heart sounds. Exam reveals no gallop and no friction rub.  No murmur heard.  Pulmonary/Chest: Effort normal and breath sounds normal. No respiratory distress. He has no wheezes.  Abdominal: Soft. Bowel sounds are normal. He exhibits no distension. There is no tenderness. Peg in place Lymphadenopathy:  He has no cervical adenopathy.  Neurological: muscle wasting. Not moving limbs during visit Skin: Skin is warm and dry. No  rash noted. No erythema.     Lab Results Recent Labs    10/18/20 0357 10/18/20 0638 10/19/20 0850  WBC 43.3*  --  32.2*  HGB 8.1*  --  9.6*  HCT 25.6*  --  30.4*  NA  --  131* 130*  K  --  3.7 3.7  CL  --  92* 91*  CO2  --  22 23  BUN  --  94* 58*  CREATININE  --  1.64* 1.12   Liver Panel Recent Labs    10/18/20 0638  PROT 7.5  ALBUMIN 1.8*  AST 41  ALT 22  ALKPHOS 243*  BILITOT 1.1  BILIDIR 0.2  IBILI 0.9   Sedimentation Rate Recent Labs    10/18/20 0638  ESRSEDRATE 128*   C-Reactive Protein Recent Labs    10/18/20 0826  CRP 28.5*    Microbiology: 10/6: blood cx - PsA  by BCID 10/6 trach cx - pending (hx of MRDO acinetobacter) Studies/Results: DG ABDOMEN PEG TUBE LOCATION  Result Date: 10/17/2020 CLINICAL DATA:  Peg tube location. EXAM: ABDOMEN - 1 VIEW COMPARISON:  Abdominal x-ray 09/28/2020. FINDINGS: Contrast is seen within nondilated stomach. PEG tube is located in the body of the stomach. No dilated bowel loops are seen. Right ureteral stent is partially visualized. IMPRESSION: 1. PEG tube is located in the body of the stomach. Electronically Signed   By: Amy  Guttmann M.D.   On: 10/17/2020 15:51   DG CHEST PORT 1 VIEW  Result Date: 10/18/2020 CLINICAL DATA:  Acute respiratory failure with hypoxia. EXAM: PORTABLE CHEST 1 VIEW COMPARISON:  October 08, 2020. FINDINGS: The heart size and mediastinal contours are within normal limits. Tracheostomy tube is in good position. Left-sided PICC line is unchanged in position. Right internal jugular dialysis catheter is unchanged. Stable bibasilar atelectasis or infiltrates are noted with possible small pleural effusions. The visualized skeletal structures are unremarkable. IMPRESSION: Stable support apparatus. Stable bibasilar opacities as described above. Electronically Signed   By: James  Green Jr M.D.   On: 10/18/2020 09:44     Assessment/Plan: Pseudomonal bacteremia = likely cause of leukocytosis. At risk for bacteremia due to having vascular lines ( [place] line and HD line). Continue on piptazo for now. Can narrow once we see sensitivities.   Agree with plan for exchange/removal of lines  Chronic respiratory failure = settings not necessarily worsened. Cxr unrevealing. He is known to be colonized with acinetobacter. Will continue to follow rather than empirically treat.  Leukocytosis = appears trending downward since abtx initiation. Continue to monitor trend  [facility] Pager: [phone]  10/19/2020, 3:15 PM      

## 2020-10-19 NOTE — Progress Notes (Signed)
CSW spoke with Mrs. Mutter from [employer] to discuss patient's wife's application for payee status - a form will be faxed to CSW for completion by NP.  Madilyn Fireman, MSW, LCSW Transitions of Care  Clinical Social Worker II [phone]

## 2020-10-19 NOTE — Progress Notes (Signed)
IR consulted for assistance with source control for bacteremia. Patient currently has RIJ tunneled dialysis catheter and LIJ tunneled central line. After a thorough discussion with the patient's primary team/Nephrology IR will plan to see the patient today to remove tunneled HD catheter and exchange tunneled central line to maintain IV access for lab draws, medications, etc. Consents for both of these procedures is in IR - telephone consent obtained from the patient's wife, June.   Consent also obtained for possible fistulogram; possible placement of a new tunneled dialysis catheter once patient is no longer bacteremic. These consents are also in IR.   Soyla Dryer, [place] [phone] 10/19/2020, 10:58 AM

## 2020-10-19 NOTE — Progress Notes (Addendum)
[facility] PROGRESS NOTE  Luis Orr RFF:[id number] DOB: 24-Oct-1950 DOA: 06/23/2020 PCP: Townsend Roger, MD  Status: Remains inpatient appropriate because:Hemodynamically unstable, Persistent severe electrolyte disturbances, Unsafe d/c plan, and Inpatient level of care appropriate due to severity of illness  Dispo: The patient is from: SNF[facility] vent unit              Anticipated d/c is to: SNF Vent capable w/ access to in facility/stretcher based HD-a facility in [place] has been located in preauthorization in progress.              Patient currently is not medically stable to d/c.   Difficult to place patient Yes    Level of care: ICU  Code Status: Partial (no CPR and no defibrillation or cardioversion) Family Communication: Wife June 10/07 DVT prophylaxis: Eliquis stopped 10/6 COVID vaccination status: Unknown    HPI:  70 y.o. 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 Patient presented secondary to altered mental status and hypotension with recurrent pneumonia requiring vasopressor support and ICU admission.he has had multiple recent admissions for sepsis secondary to various infections including UTIs, pneumonias and bacteremia in setting of MDR Klebsiella, MRSA and pseudomonas. Most recent admission September 2021 in setting of sepsis due to MDR Klebsiella with right ureteral calculus s/p double J ureteral stent placement and treated with meropenem x 10days. He was discharged back to [facility].  ICU significant events: 6/29 admitted to ICU for septic shock on levo to maintain MAP>65, continued on vent support 6/29 Blood Cx>> staph species in 1 of 4 cultures drawn>> suspect contaminant 6/30 Off pressors 7/2 ID Consult for MDR acinetobacter in trach asp, Meropenem changed to unasyn 7/2 PRBC transfusion, iron replacement 7/5 Pressors added again. Central line placed. Drainage from gastrostomy tube >>  CT Abd without fluid collection around gastrostomy tube but with diffuse body wall edema. Echo EF 50-55%, mild LVH 7/6 Renal Replacement Therapy ; Code status changed to partial Code (DNR) 7/7 Transfuse 1U PRBC 7/9 add solu cortef 7/10 off pressors, weaning stress steroids 7/11 Pressors added back on again 7/12 ID consult due to rising WBC, hypothermia, hypotension concern for worsening infection. Vancomycin added. Repeat blood cx collected. 7/15 CRRT stopped 7/17 Restarted on low dose levo. No evidence of new infection. Held off on antibiotics. Nephrology contacted family and decision was made to continue iHD this week to allow additional time for renal recovery. Patient is not the best long term iHD candidate but family wants aggressive care 7/18 plans for iHD, tolerated with 2L removed 719 slight drop in hgb to 6.9 will transfuse 1 unit PRBC 7/22 > 7/26: Tolerating HD. TOC consulted for placement. Will likely need out of state placement. 8/15 WBCs greater than 28,000, hypoglycemia, procalcitonin >18, chest x-ray consistent with pneumonia, recent blood cultures consistent with contamination.  Unasyn IV resumed after sputum culture obtained 8/16 consultation placed with ethics committee regarding evaluation for futility of care 8/16 Sputum cx + for Acinetobacter with sensitivities pending 8/17 extensive conversation held with patient's wife regarding patient's poor prognosis and reemergence of Acinetobacter VAP.  Also discussed with her that patient has been able to convey to multiple staff that he does not want to continue on the ventilator or with dialysis.  Wife reports that the patient does not understand what he is saying, she is the POA and that she wants to continue aggressive care. 8/18 patient now telling nephrology, myself and the rest of the  staff he replies yes to wanting to remain on dialysis and on the ventilator and this direct question is asked. 8/18 Acinetobacter sensitive to  Unasyn only.  Discussed with ID/Dr. Juleen China.  Plan is to administer antibiotics for 7-day since patient has not had any increased O2 needs and remains afebrile.  If any concerns over worsening symptoms can give an additional 3 days. Developed moderate bleeding around tracheostomy site on 8/19.  Likely related to combination of Eliquis and heparin required for CRRT.  We will hold Eliquis for now and monitor degree of bleeding Eliquis resumed on 8/22.  Overnight into 8/23 patient bit his tongue and had bleeding.  Also had some bleeding around trach likely from oral bleeding.  Also though did have bleeding from old central line site. 8/23 had transient bleeding around trach and prior central line insertion site overnight.  Patient had bitten tongue and this likely explain the bleeding.  This morning patient was not having any bleeding.  On 8/25 had redeveloped slow ooze bleeding from mouth and trach.  Eliquis discontinued in favor of IV heparin especially given patient will undergo surgical procedure for vascular access on 8/29 8/30 Left FA AVG occluded 9/2 status post thrombectomy and revision of LUE AV graft 9/4 LUE edema with negative venous duplex. 9/5 heparin discontinued and Eliquis resumed 10/4 PEG tube partially dislodged in IR replaced at bedside 10/5 PEG tube again partially dislodged and replaced by IR at bedside 10/6 patient with significant leukocytosis 43,300 with low-grade fever.  Blood cultures obtained and empirically started on Zosyn and vancomycin 10/6 Eliquis placed on hold in anticipation of IR venogram on Monday 10/10 10/7 blood cultures x2 positive for Pseudomonas.  Vancomycin discontinued.  ID consulted.  Plans are to remove hemodialysis catheter as well as tunneled central line with critical care replacing central line and +/- reinserting tunneled HD catheter although currently tolerating dialysis through aVF not need to have dialysis catheter replaced 10/7 patient with  maroon-colored liquid stool from colostomy stoma.  Occult ordered.    Subjective: Patient awake.  I informed him regarding his Pseudomonas and bloodstream diagnosis and he nodded.  During abdominal palpation I asked him if his abdomen/belly hurt and he nodded yes  Objective: Vitals:   10/19/20 0500 10/19/20 0606  BP: 110/61 116/66  Pulse: 87 89  Resp: 20 20  Temp:    SpO2: 99% 100%    Intake/Output Summary (Last 24 hours) at 10/19/2020 0724 Last data filed at 10/19/2020 0400 Gross per 24 hour  Intake 2547.73 ml  Output 3050 ml  Net -502.27 ml   Filed Weights   10/18/20 1500 10/18/20 1845 10/19/20 0500  Weight: 78.6 kg 76.1 kg 79.2 kg    Exam:  Constitutional: Awake, somewhat more alert than previous.  Was nodding appropriately when questions asked. Respiratory: Trach: 6.0 XLT cuffed, PRVC mode, FiO2 40% -rate 20-PEEP 5; lung sounds remain clear to auscultation.  Resting pulse oximetry 100% Cardiovascular: BP remains labile, no tachycardia, heart sounds remain normal Abdomen: LBM 10/05, PEG tube in place and confirmed with x-ray, after nontender left lower quadrant colostomy with liquid stool. Neurologic: Chronic quadriplegia. No sensation below shoulders. Psychiatric: Awake, and flat affect.  Appears depressed.   Assessment/Plan: Acute problems: Sepsis 2/2 pseudomonal bacteremia  WBC  43.3 (10/6), patient with more labile blood pressure on 10/6, TM 100.3--10/7: WBC down to 32.2, ESR 128 and CRP 28.5 Blood cultures x 2/2 positive for gram-negative rods with BC ID consistent with Pseudomonas Continue empiric Zosyn until  sensitivities returned, discontinue vancomycin ID consulted and recommendations pending Discussed with PCCM, nephrology and IR and plan is to remove tunneled dialysis catheter and exchanged out tunneled central line catheter for a fresh catheter Repeat blood cultures ordered for 10/8 Given patient has a gram-negative bacteremia and has Hemoccult positive  colostomy drainage as a precaution we will check DIC panel in the a.m.  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  They discussed with his wife.  Given patient's debilitated state/diverticulitis they recommended holding off on colonoscopy for now.  They have signed off  Patient recently treated with Flagyl for acute diverticulitis with resolution of abdominal pain which has now recurred 10/7 patient with maroon-colored drainage from ostomy.  This is Hemoccult positive.  Eliquis held on 10/6 in anticipation of venogram on 10/10 Will not use pharmacological DVT prophylaxis at this time  Persistent LUE edema Previous imaging for SVT/DVT unremarkable Recent ultrasound: patent aVF-venogram pending due to concerns of possible proximal stenosis of the vessels leading into AV fistula Currently on Eliquis and does receive heparin with HD   AKI with oliguria/volume overload/ New CKD requiring HD Anuric-Nephrology following, initially required CRRT and has transitioned to HD -LUE AVG functional x4 weeks Nephrology documents volume overload that has not been easily removed with dialysis therefore extra dialysis sessions stopped in favor of TTS schedule. 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  IR replaced at bedside and KUB confirms appropriate  placement  Pressure injury/stage IV sacral decubitus Stage IV medial/posterior sacrum present on admission.  Right/posterior/lateral ankle unknown if present on admission.  DTI left ear, not present on admission. (For additional documentation see wound care notes) Nursing states that base of wound is red and granular but has an odor similar to Pseudomonas   Septic shock 2/2 VAP POA resolved  Chronic thoracic back pain Continue scheduled low-dose Oxy IR along, IV morphine prn, Skelaxin and lidocaine patch   History of paroxysmal atrial fibrillation Continue amiodarone and Eliquis 9/30 QTC 385 ms   Acute metabolic encephalopathy Resolved  Diabetes mellitus, type 2 Continue Semglee 30 units daily.  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  As needed dosing continued  Quadriplegia secondary to compressive myelopathy.  Plan is to discharge to facility in [place]   Microcytic anemia/Acute anemia superimposed on anemia of chronic kidney disease S/P 6 units of PRBC -Continue Aranesp; Hb average between 8 and 9      Data Reviewed: Basic Metabolic Panel: Recent Labs  Lab 10/15/20 0500 10/18/20 0638  NA 131* 131*  K 4.9 3.7  CL 91* 92*  CO2 22 22  GLUCOSE 136* 175*  BUN 108* 94*  CREATININE 1.62* 1.64*  CALCIUM 9.6  9.2  PHOS 6.0* 5.3*   Liver Function Tests: Recent Labs  Lab 10/15/20 0500 10/18/20 0638  AST  --  41  ALT  --  22  ALKPHOS  --  243*  BILITOT  --  1.1  PROT  --  7.5  ALBUMIN 1.7* 1.8*    CBC: Recent Labs  Lab 10/18/20 0357  WBC 43.3*  NEUTROABS 38.7*  HGB 8.1*  HCT 25.6*  MCV 75.5*   PLT 524*   CBG: Recent Labs  Lab 10/18/20 1130 10/18/20 1601 10/18/20 1939 10/18/20 2308 10/19/20 0346  GLUCAP 164* 135* 228* 220* 171*      Scheduled Meds:  amiodarone  200 mg Per Tube Daily   buPROPion  75 mg Per Tube BID   chlorhexidine gluconate (MEDLINE KIT)  15 mL Mouth Rinse BID   Chlorhexidine Gluconate Cloth  6 each Topical Q0600   darbepoetin (ARANESP) injection - DIALYSIS  150 mcg Intravenous Q Tue-HD   feeding supplement (PROSource TF)  45 mL Per Tube TID   guaiFENesin  5 mL Per Tube BID   insulin aspart  0-9 Units Subcutaneous Q4H   insulin glargine-yfgn  30 Units Subcutaneous Daily   lidocaine  1 patch Transdermal Q24H   mouth rinse  15 mL Mouth Rinse 10 times per day   metaxalone  800 mg Per Tube TID   midodrine  5 mg Per Tube TID WC   multivitamin  1 tablet Per Tube QHS   oxyCODONE  5 mg Per Tube Q4H   pantoprazole sodium  40 mg Per Tube Daily   sertraline  100 mg Per Tube Daily   simethicone  40 mg Per Tube QID   sodium chloride flush  10-40 mL Intracatheter Q12H   Continuous Infusions:  sodium chloride 250 mL (10/19/20 0211)   feeding supplement (NEPRO CARB STEADY) 1,000 mL (10/18/20 2220)   norepinephrine (LEVOPHED) Adult infusion Stopped (10/18/20 0721)   piperacillin-tazobactam (ZOSYN)  IV Stopped (10/19/20 0242)    Principal Problem:   Acute respiratory failure with hypoxia (HCC) Active Problems:   Ventilator dependent (HCC)   Sacral decubitus ulcer   Septic shock (HCC)   Pressure injury of skin   AKI (acute kidney injury) (HCC)   Hypotension   Goals of care, counseling/discussion   Quadriplegia (HCC)   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)   Hyperglycemia   Sepsis due to Acinetobacter baumannii ([place])   Pneumonia due to Acinetobacter species (HCC)   Dysautonomia (HCC)   Chronic midline thoracic back pain   Abdominal distention   Leukocytosis   Bacteremia due to  Pseudomonas   Consultants: PCCM Nephrology Infectious disease Palliative care medicine  Procedures: Echocardiogram Insertion of double-lumen HD catheter  Antibiotics: Cefepime x1 dose Meropenem 6/29 through 7/2 Vancomycin 6/29 through 6/30 Unasyn 7/2 through 7/15 Unasyn 8/15 >8/22 Vancomycin 10/6 through 10/ Zosyn 10/6 >>   Time spent: 25 minutes    Erin Hearing ANP  [facility] 7 am - 330 pm/M-F for direct patient care and secure chat Please refer to Amion for contact info 100  days

## 2020-10-20 ENCOUNTER — Inpatient Hospital Stay (HOSPITAL_COMMUNITY): Payer: 59

## 2020-10-20 DIAGNOSIS — J9601 Acute respiratory failure with hypoxia: Secondary | ICD-10-CM | POA: Diagnosis not present

## 2020-10-20 LAB — COMPREHENSIVE METABOLIC PANEL
ALT: 28 U/L (ref 0–44)
AST: 35 U/L (ref 15–41)
Albumin: 2 g/dL — ABNORMAL LOW (ref 3.5–5.0)
Alkaline Phosphatase: 250 U/L — ABNORMAL HIGH (ref 38–126)
Anion gap: 22 — ABNORMAL HIGH (ref 5–15)
BUN: 88 mg/dL — ABNORMAL HIGH (ref 8–23)
CO2: 18 mmol/L — ABNORMAL LOW (ref 22–32)
Calcium: 9.5 mg/dL (ref 8.9–10.3)
Chloride: 90 mmol/L — ABNORMAL LOW (ref 98–111)
Creatinine, Ser: 1.39 mg/dL — ABNORMAL HIGH (ref 0.61–1.24)
GFR, Estimated: 55 mL/min — ABNORMAL LOW (ref >60)
Glucose, Bld: 97 mg/dL (ref 70–99)
Potassium: 3.8 mmol/L (ref 3.5–5.1)
Sodium: 130 mmol/L — ABNORMAL LOW (ref 135–145)
Total Bilirubin: 1.5 mg/dL — ABNORMAL HIGH (ref 0.3–1.2)
Total Protein: 8.8 g/dL — ABNORMAL HIGH (ref 6.5–8.1)

## 2020-10-20 LAB — CBC WITH DIFFERENTIAL/PLATELET
Abs Immature Granulocytes: 0 10*3/uL (ref 0.00–0.07)
Basophils Absolute: 0 10*3/uL (ref 0.0–0.1)
Basophils Relative: 0 %
Eosinophils Absolute: 0 10*3/uL (ref 0.0–0.5)
Eosinophils Relative: 0 %
HCT: 28.3 % — ABNORMAL LOW (ref 39.0–52.0)
Hemoglobin: 9.1 g/dL — ABNORMAL LOW (ref 13.0–17.0)
Lymphocytes Relative: 4 %
Lymphs Abs: 1.9 10*3/uL (ref 0.7–4.0)
MCH: 23.9 pg — ABNORMAL LOW (ref 26.0–34.0)
MCHC: 32.2 g/dL (ref 30.0–36.0)
MCV: 74.3 fL — ABNORMAL LOW (ref 80.0–100.0)
Monocytes Absolute: 3.7 10*3/uL — ABNORMAL HIGH (ref 0.1–1.0)
Monocytes Relative: 8 %
Neutro Abs: 40.7 10*3/uL — ABNORMAL HIGH (ref 1.7–7.7)
Neutrophils Relative %: 88 %
Platelets: 718 10*3/uL — ABNORMAL HIGH (ref 150–400)
RBC: 3.81 MIL/uL — ABNORMAL LOW (ref 4.22–5.81)
RDW: 21.2 % — ABNORMAL HIGH (ref 11.5–15.5)
WBC: 46.3 10*3/uL — ABNORMAL HIGH (ref 4.0–10.5)
nRBC: 2 /100 WBC — ABNORMAL HIGH
nRBC: 3.2 % — ABNORMAL HIGH (ref 0.0–0.2)

## 2020-10-20 LAB — DIC (DISSEMINATED INTRAVASCULAR COAGULATION)PANEL
D-Dimer, Quant: 2.41 ug/mL-FEU — ABNORMAL HIGH (ref 0.00–0.50)
Fibrinogen: 723 mg/dL — ABNORMAL HIGH (ref 210–475)
INR: 1.4 — ABNORMAL HIGH (ref 0.8–1.2)
Platelets: 718 10*3/uL — ABNORMAL HIGH (ref 150–400)
Prothrombin Time: 17.5 seconds — ABNORMAL HIGH (ref 11.4–15.2)
Smear Review: NONE SEEN
aPTT: 38 seconds — ABNORMAL HIGH (ref 24–36)

## 2020-10-20 LAB — CULTURE, RESPIRATORY W GRAM STAIN: Culture: NORMAL

## 2020-10-20 LAB — HEMOGLOBIN AND HEMATOCRIT, BLOOD
HCT: 33.5 % — ABNORMAL LOW (ref 39.0–52.0)
Hemoglobin: 10.2 g/dL — ABNORMAL LOW (ref 13.0–17.0)

## 2020-10-20 LAB — C-REACTIVE PROTEIN: CRP: 29.9 mg/dL — ABNORMAL HIGH (ref <1.0)

## 2020-10-20 LAB — GLUCOSE, CAPILLARY
Glucose-Capillary: 120 mg/dL — ABNORMAL HIGH (ref 70–99)
Glucose-Capillary: 114 mg/dL — ABNORMAL HIGH (ref 70–99)
Glucose-Capillary: 145 mg/dL — ABNORMAL HIGH (ref 70–99)
Glucose-Capillary: 140 mg/dL — ABNORMAL HIGH (ref 70–99)
Glucose-Capillary: 148 mg/dL — ABNORMAL HIGH (ref 70–99)
Glucose-Capillary: 157 mg/dL — ABNORMAL HIGH (ref 70–99)
Glucose-Capillary: 130 mg/dL — ABNORMAL HIGH (ref 70–99)

## 2020-10-20 LAB — LACTIC ACID, PLASMA: Lactic Acid, Venous: 3.7 mmol/L (ref 0.5–1.9)

## 2020-10-20 LAB — SEDIMENTATION RATE: Sed Rate: 130 mm/hr — ABNORMAL HIGH (ref 0–16)

## 2020-10-20 MED ORDER — PANTOPRAZOLE SODIUM 40 MG IV SOLR
40.0000 mg | Freq: Two times a day (BID) | INTRAVENOUS | Status: DC
  Administered 2020-10-20 – 2020-10-21 (×2): 40 mg via INTRAVENOUS
  Filled 2020-10-20 (×2): qty 40

## 2020-10-20 NOTE — Progress Notes (Signed)
Pt found to have new distention in abdomen with very litte output. Contacted MD and STAT CT abd was ordered. Pt now presenting with upward right gaze and a left facial droop. Called Code Stroke. MD alerted and came bedside to access patient. MD informed me to hold off on code stroke due to recent CT abd results of ischemic bowel, making him not a candidate for TPA. Code Stroke canceled. MD is contacted wife for [place] discussion. Family is coming to see patient bedside. Will continue to closely monitor patient.

## 2020-10-20 NOTE — Consult Note (Signed)
CC: unable to obtain  Requesting provider: Dr Tyrell Antonio  HPI: Luis Orr is an 70 y.o. male who has been here for over 100 days.  Initially admitted for septic shock on Levophed.  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  He resides at [facility].  This admission he initially presented with gross MDR Acinetobacter from his trach which was treated with meropenem.  He progressed renal failure requiring renal replacement therapy.  The plan is currently for him to go to a long-term nursing facility that will accept a trach vented patient on dialysis.  Most recently the patient has had a worsening leukocytosis and it was initially thought that he may have had a line infection so his line was removed.  Vascular surgery created an AV fistula.  He had abdominal distention today so a noncontrasted CT was ordered which revealed extensive gastric pneumatosis duodenal pneumatosis proximal jejunal pneumatosis with portal venous gas as well as gas within the SMV and perigastric veins.  Patient has a gastrotomy tube that has had some intermittent issues and has had to be replaced at times.  He has a colostomy as well.  His colostomy output has decreased over the past 48 hours.  Of note the patient's baseline neurological status is marginal.  The patient does not follow commands per nursing staff.  He will track.  This afternoon while in CT and on the way back the nurses noted that the patient had diminished neurological response.  He no longer tract.  He had an upward and outer gaze along with a left facial droop.  He is currently not on any pressors.  His vitals are stable.  Past Medical History:  Diagnosis Date   Anemia    Anxiety    Chronic respiratory failure (HCC)    DM2 (diabetes mellitus, type 2) (HCC)    Functional quadriplegia (HCC)    Hypertension    Multiple drug resistant organism  (MDRO) culture positive    Urine retention    UTI (urinary tract infection)     Past Surgical History:  Procedure Laterality Date   AV FISTULA PLACEMENT Left 08/23/2020   Procedure: INSERTION OF LEFT UPPER ARM ARTERIOVENOUS (AV) GORE-TEX GRAFT;  Surgeon: Cherre Robins, MD;  Location: [facility];  Service: Vascular;  Laterality: Left;   COLOSTOMY     CYSTOSCOPY WITH STENT PLACEMENT Right 10/08/2019   Procedure: CYSTOSCOPY WITH  STENT PLACEMENT;  Surgeon: Lucas Mallow, MD;  Location: [facility];  Service: Urology;  Laterality: Right;   IR FLUORO GUIDE CV LINE LEFT  08/28/2020   IR FLUORO GUIDE CV LINE LEFT  10/19/2020   IR FLUORO GUIDE CV LINE RIGHT  08/07/2020   IR FLUORO GUIDE CV LINE RIGHT  08/07/2020   IR REMOVAL TUN CV CATH W/O FL  10/19/2020   IR REPLACE G-TUBE SIMPLE WO FLUORO  09/07/2020   IR REPLACE G-TUBE SIMPLE WO FLUORO  10/17/2020   IR US GUIDE VASC ACCESS LEFT  08/28/2020   IR US GUIDE VASC ACCESS RIGHT  08/07/2020   PEG PLACEMENT     ROTATOR CUFF REPAIR     THROMBECTOMY AND REVISION OF ARTERIOVENTOUS (AV) GORETEX  GRAFT Left 10/04/2020   Procedure: ARTERIOVENIOUS REVISION AND THROMBECTOMY LEFT ARM;  Surgeon: Serafina Mitchell, MD;  Location: [facility];  Service: Vascular;  Laterality: Left;   TRACHEOSTOMY      History reviewed. No pertinent family  history.  Social:  reports that he has never smoked. He has never used smokeless tobacco. No history on file for alcohol use and drug use.  Allergies:  Allergies  Allergen Reactions   Atenolol     Other reaction(s): Weakness present   Fosinopril Cough   Plendil [Felodipine]    Adhesive [Tape] Rash    Medications: I have reviewed the patient's current medications.   ROS -unable to obtain.  Patient nonverbal. PE Blood pressure 124/75, pulse 98, temperature 97.6 F (36.4 C), temperature source Oral, resp. rate 20, height '5\' 6"'$  (1.676 m), weight 79.7 kg, SpO2 100 %. Constitutional: Chronically ill-appearing gentleman  no  deformities Eyes: Moist conjunctiva; no lid lag; anicteric; pupils are equal but sluggish and minimally reactive Neck: Trachea midline; no thyromegaly;  Lungs: Normal respiratory effort; no tactile fremitus CV: RRR; no palpable thrills; some pitting edema GI: Abd very distended full abdomen, flat ostomy bag.  Appears to have gastric juices leaking around the gastrotomy tube; no palpable hepatosplenomegaly MSK:  no clubbing/cyanosis Psychiatric: Unable to assess, nonverbal Lymphatic: No palpable cervical or axillary lymphadenopathy Skin: Sacral decubitus  Results for orders placed or performed during the hospital encounter of 06/21/2020 (from the past 48 hour(s))  Glucose, capillary     Status: Abnormal   Collection Time: 10/18/20 11:08 PM  Result Value Ref Range   Glucose-Capillary 220 (H) 70 - 99 mg/dL    Comment: Glucose reference range applies only to samples taken after fasting for at least 8 hours.  Glucose, capillary     Status: Abnormal   Collection Time: 10/19/20  3:46 AM  Result Value Ref Range   Glucose-Capillary 171 (H) 70 - 99 mg/dL    Comment: Glucose reference range applies only to samples taken after fasting for at least 8 hours.  Glucose, capillary     Status: Abnormal   Collection Time: 10/19/20  8:11 AM  Result Value Ref Range   Glucose-Capillary 191 (H) 70 - 99 mg/dL    Comment: Glucose reference range applies only to samples taken after fasting for at least 8 hours.  CBC     Status: Abnormal   Collection Time: 10/19/20  8:50 AM  Result Value Ref Range   WBC 32.2 (H) 4.0 - 10.5 K/uL   RBC 4.00 (L) 4.22 - 5.81 MIL/uL   Hemoglobin 9.6 (L) 13.0 - 17.0 g/dL   HCT 30.4 (L) 39.0 - 52.0 %   MCV 76.0 (L) 80.0 - 100.0 fL   MCH 24.0 (L) 26.0 - 34.0 pg   MCHC 31.6 30.0 - 36.0 g/dL   RDW 21.2 (H) 11.5 - 15.5 %   Platelets 596 (H) 150 - 400 K/uL   nRBC 3.6 (H) 0.0 - 0.2 %    Comment: Performed at [facility] [address] [place], [place] [id number]  Basic  metabolic panel     Status: Abnormal   Collection Time: 10/19/20  8:50 AM  Result Value Ref Range   Sodium 130 (L) 135 - 145 mmol/L   Potassium 3.7 3.5 - 5.1 mmol/L   Chloride 91 (L) 98 - 111 mmol/L   CO2 23 22 - 32 mmol/L   Glucose, Bld 221 (H) 70 - 99 mg/dL    Comment: Glucose reference range applies only to samples taken after fasting for at least 8 hours.   BUN 58 (H) 8 - 23 mg/dL   Creatinine, Ser 1.12 0.61 - 1.24 mg/dL   Calcium 9.2 8.9 - 10.3 mg/dL  GFR, Estimated >60 >60 mL/min    Comment: (NOTE) Calculated using the CKD-EPI Creatinine Equation (2021)    Anion gap 16 (H) 5 - 15    Comment: Performed at [facility], [place] [address], [place], [place] [address]  Occult blood card to lab, stool     Status: Abnormal   Collection Time: 10/19/20  8:51 AM  Result Value Ref Range   Fecal Occult Bld POSITIVE (A) NEGATIVE    Comment: Performed at [facility] [address] [place], [place] [address]  Glucose, capillary     Status: Abnormal   Collection Time: 10/19/20 11:26 AM  Result Value Ref Range   Glucose-Capillary 201 (H) 70 - 99 mg/dL    Comment: Glucose reference range applies only to samples taken after fasting for at least 8 hours.  Glucose, capillary     Status: Abnormal   Collection Time: 10/19/20  3:29 PM  Result Value Ref Range   Glucose-Capillary 185 (H) 70 - 99 mg/dL    Comment: Glucose reference range applies only to samples taken after fasting for at least 8 hours.  Glucose, capillary     Status: Abnormal   Collection Time: 10/19/20  7:48 PM  Result Value Ref Range   Glucose-Capillary 231 (H) 70 - 99 mg/dL    Comment: Glucose reference range applies only to samples taken after fasting for at least 8 hours.  Glucose, capillary     Status: Abnormal   Collection Time: 10/19/20 11:16 PM  Result Value Ref Range   Glucose-Capillary 243 (H) 70 - 99 mg/dL    Comment: Glucose reference range applies only to samples taken after fasting for at least 8  hours.  Glucose, capillary     Status: Abnormal   Collection Time: 10/20/20  3:44 AM  Result Value Ref Range   Glucose-Capillary 120 (H) 70 - 99 mg/dL    Comment: Glucose reference range applies only to samples taken after fasting for at least 8 hours.  CBC with Differential/Platelet     Status: Abnormal   Collection Time: 10/20/20  5:00 AM  Result Value Ref Range   WBC 46.3 (H) 4.0 - 10.5 K/uL   RBC 3.81 (L) 4.22 - 5.81 MIL/uL   Hemoglobin 9.1 (L) 13.0 - 17.0 g/dL   HCT 28.3 (L) 39.0 - 52.0 %   MCV 74.3 (L) 80.0 - 100.0 fL   MCH 23.9 (L) 26.0 - 34.0 pg   MCHC 32.2 30.0 - 36.0 g/dL   RDW 21.2 (H) 11.5 - 15.5 %   Platelets 718 (H) 150 - 400 K/uL    Comment: REPEATED TO VERIFY   nRBC 3.2 (H) 0.0 - 0.2 %   Neutrophils Relative % 88 %   Neutro Abs 40.7 (H) 1.7 - 7.7 K/uL   Lymphocytes Relative 4 %   Lymphs Abs 1.9 0.7 - 4.0 K/uL   Monocytes Relative 8 %   Monocytes Absolute 3.7 (H) 0.1 - 1.0 K/uL   Eosinophils Relative 0 %   Eosinophils Absolute 0.0 0.0 - 0.5 K/uL   Basophils Relative 0 %   Basophils Absolute 0.0 0.0 - 0.1 K/uL   nRBC 2 (H) 0 /100 WBC   Abs Immature Granulocytes 0.00 0.00 - 0.07 K/uL   Polychromasia PRESENT    Target Cells PRESENT     Comment: Performed at [facility], [place] [address], [place], [place] [address]  Comprehensive metabolic panel     Status: Abnormal   Collection  Time: 10/20/20  5:00 AM  Result Value Ref Range   Sodium 130 (L) 135 - 145 mmol/L   Potassium 3.8 3.5 - 5.1 mmol/L   Chloride 90 (L) 98 - 111 mmol/L   CO2 18 (L) 22 - 32 mmol/L   Glucose, Bld 97 70 - 99 mg/dL    Comment: Glucose reference range applies only to samples taken after fasting for at least 8 hours.   BUN 88 (H) 8 - 23 mg/dL   Creatinine, Ser 1.39 (H) 0.61 - 1.24 mg/dL   Calcium 9.5 8.9 - 10.3 mg/dL   Total Protein 8.8 (H) 6.5 - 8.1 g/dL   Albumin 2.0 (L) 3.5 - 5.0 g/dL   AST 35 15 - 41 U/L   ALT 28 0 - 44 U/L   Alkaline Phosphatase 250 (H) 38 - 126 U/L   Total  Bilirubin 1.5 (H) 0.3 - 1.2 mg/dL   GFR, Estimated 55 (L) >60 mL/min    Comment: (NOTE) Calculated using the CKD-EPI Creatinine Equation (2021)    Anion gap 22 (H) 5 - 15    Comment: REPEATED TO VERIFY Performed at [facility], [address], [place], [place] [address]   C-reactive protein     Status: Abnormal   Collection Time: 10/20/20  5:00 AM  Result Value Ref Range   CRP 29.9 (H) <1.0 mg/dL    Comment: Performed at [facility] [address] [place], [place] [address]  Sedimentation rate     Status: Abnormal   Collection Time: 10/20/20  5:00 AM  Result Value Ref Range   Sed Rate 130 (H) 0 - 16 mm/hr    Comment: Performed at [facility] [address] [place], [place] [address]  DIC Panel Tomorrow AM 0500     Status: Abnormal   Collection Time: 10/20/20  5:00 AM  Result Value Ref Range   Prothrombin Time 17.5 (H) 11.4 - 15.2 seconds   INR 1.4 (H) 0.8 - 1.2   aPTT 38 (H) 24 - 36 seconds   Fibrinogen 723 (H) 210 - 475 mg/dL   D-Dimer, Quant 2.41 (H) 0.00 - 0.50 ug/mL-FEU   Platelets 718 (H) 150 - 400 K/uL   Smear Review NO SCHISTOCYTES SEEN     Comment: Performed at [facility] [address] [place], [place] [address]  Glucose, capillary     Status: Abnormal   Collection Time: 10/20/20  7:54 AM  Result Value Ref Range   Glucose-Capillary 114 (H) 70 - 99 mg/dL    Comment: Glucose reference range applies only to samples taken after fasting for at least 8 hours.  Glucose, capillary     Status: Abnormal   Collection Time: 10/20/20 11:04 AM  Result Value Ref Range   Glucose-Capillary 145 (H) 70 - 99 mg/dL    Comment: Glucose reference range applies only to samples taken after fasting for at least 8 hours.  Glucose, capillary     Status: Abnormal   Collection Time: 10/20/20  3:48 PM  Result Value Ref Range   Glucose-Capillary 140 (H) 70 - 99 mg/dL    Comment: Glucose reference range applies only to samples taken after fasting for at least 8  hours.    CT ABDOMEN PELVIS WO CONTRAST  Result Date: 10/20/2020 CLINICAL DATA:  Abdominal distension EXAM: CT ABDOMEN AND PELVIS WITHOUT CONTRAST TECHNIQUE: Multidetector CT imaging of the abdomen and pelvis was performed following the standard protocol without IV contrast. Sagittal  and coronal MPR images reconstructed from axial data set. COMPARISON:  09/24/2020 FINDINGS: Lower chest: BILATERAL lower lobe volume loss and bronchiectasis. Underlying emphysematous changes. Hepatobiliary: Dependent calculi within gallbladder. Marked portal venous gas within liver. No focal hepatic mass. Pancreas: Atrophic, otherwise unremarkable. Spleen: Normal appearance Adrenals/Urinary Tract: Markedly atrophic RIGHT kidney with ureteral stent extending to bladder. Large cyst LEFT kidney 7.9 x 8.4 x 10.3 cm with smaller cyst at upper pole. Nonobstructing calculi RIGHT kidney. No hydronephrosis or hydroureter. 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 Hartmann pouch. Segmental wall thickening ascending colon. Remainder of colon unremarkable. Extensive gastric pneumatosis extending into distal esophagus. Additional pneumatosis throughout duodenum and into proximal jejunum associated extensive air within superior mesenteric vein branches and within perigastric veins. Findings are highly concerning for ischemia. Gastrostomy tube noted within stomach with gaseous distention of stomach and small fluid level. No definite colonic pneumatosis. Mild dilatation of small bowel loops diffusely favoring ileus. Vascular/Lymphatic: Extensive atherosclerotic calcifications aorta, iliac arteries, visceral arteries. Noncontrast exam, unable to assess vessel patency. No high attenuation thrombi grossly evident. Aorta normal caliber. No adenopathy. Reproductive: Unremarkable Other: No free air or free fluid. Parastomal herniation of fat. No acute osseous fall findings. Musculoskeletal: No acute osseous findings.  IMPRESSION: Pneumatosis of gastric wall and duodenum extending into proximal jejunum and distal esophagus, associated with significant gas within perigastric and mesenteric veins and extensive portal venous gas; findings are concerning for ischemia. Extensive atherosclerotic calcifications but unable to assess for patency of mesenteric and visceral branch vessels due to lack of IV contrast. Secondary ileus of bowel with gaseous distention of stomach. Cholelithiasis. BILATERAL lower lobe atelectasis and bronchiectasis. Parastomal herniation of fat. Atrophic RIGHT kidney with 10.3 cm LEFT renal cyst. Aortic Atherosclerosis (ICD10-I70.0) and Emphysema (ICD10-J43.9). Critical Value/emergent results were called by telephone at the time of interpretation on 10/20/2020 at 1757 hours to provider Niel Hummer MD, who verbally acknowledged these results. Electronically Signed   By: Lavonia Dana M.D.   On: 10/20/2020 18:04   DG Abd 1 View  Result Date: 10/20/2020 CLINICAL DATA:  Abdominal distension. EXAM: ABDOMEN - 1 VIEW COMPARISON:  October 17, 2020 FINDINGS: Limited view of the abdomen demonstrates diffuse nonspecific gaseous distension of small bowel loops of up to 3.2 cm. Right double-J ureteral stent. No evidence of free gas on this supine radiograph excluding the diaphragm. IMPRESSION: Limited view of the abdomen demonstrates diffuse nonspecific gaseous distension of small bowel loops of up to 3.2 cm. Electronically Signed   By: Fidela Salisbury M.D.   On: 10/20/2020 15:45   IR Fluoro Guide CV Line Left  Result Date: 10/19/2020 INDICATION: 70 year old gentleman with tunneled right IJ hemodialysis catheter, nonfunctioning dialysis AV fistula, and tunneled left IJ power line was found to have Pseudomonas bacteremia. IR consulted for removal of tunneled dialysis catheter for line holiday. Patient still requires central venous access therefore tunneled left IJ power line was exchanged. EXAM: REMOVAL TUNNELED  CENTRAL VENOUS CATHETER EXCHANGE OF LEFT IJ POWER LINE MEDICATIONS: Patient receiving antibiotics per in-patient protocol ANESTHESIA/SEDATION: None FLUOROSCOPY TIME:  Fluoroscopy Time: 1.2 minutes (12.4 mGy). COMPLICATIONS: None immediate. PROCEDURE: Informed written consent was obtained from the patient/patient representative after a thorough discussion of the procedural risks, benefits and alternatives. All questions were addressed. Maximal Sterile Barrier Technique was utilized including caps, mask, sterile gowns, sterile gloves, sterile drape, hand hygiene and skin antiseptic. A timeout was performed prior to the initiation of the procedure. Both tunneled hemodialysis catheter and power line were prepped and  draped in usual fashion. The left IJ power line was removed over a 0.018 inch guidewire. New dual lumen power line was cut to the same length and inserted over the guidewire with tip positioned at the cavoatrial junction. Both lumens aspirated and flushed well. The catheter was secured to skin with suture and the insert site was covered with bio patch and sterile dressing. Tunneled right IJ hemodialysis catheter removed without difficulty. Insertion site covered with sterile dressing. IMPRESSION: 1. Successful removal of tunneled right IJ hemodialysis catheter. 2. Successful exchange of tunneled left IJ power line. Exchange was performed with the patient to maintain central venous access. 3. Fistulagram should not be performed until patient has cleared his bacteremia. Electronically Signed   By: Miachel Roux M.D.   On: 10/19/2020 15:30   IR Removal Tun Cv Cath W/O FL  Result Date: 10/19/2020 INDICATION: 69 year old gentleman with tunneled right IJ hemodialysis catheter, nonfunctioning dialysis AV fistula, and tunneled left IJ power line was found to have Pseudomonas bacteremia. IR consulted for removal of tunneled dialysis catheter for line holiday. Patient still requires central venous access therefore  tunneled left IJ power line was exchanged. EXAM: REMOVAL TUNNELED CENTRAL VENOUS CATHETER EXCHANGE OF LEFT IJ POWER LINE MEDICATIONS: Patient receiving antibiotics per in-patient protocol ANESTHESIA/SEDATION: None FLUOROSCOPY TIME:  Fluoroscopy Time: 1.2 minutes (12.4 mGy). COMPLICATIONS: None immediate. PROCEDURE: Informed written consent was obtained from the patient/patient representative after a thorough discussion of the procedural risks, benefits and alternatives. All questions were addressed. Maximal Sterile Barrier Technique was utilized including caps, mask, sterile gowns, sterile gloves, sterile drape, hand hygiene and skin antiseptic. A timeout was performed prior to the initiation of the procedure. Both tunneled hemodialysis catheter and power line were prepped and draped in usual fashion. The left IJ power line was removed over a 0.018 inch guidewire. New dual lumen power line was cut to the same length and inserted over the guidewire with tip positioned at the cavoatrial junction. Both lumens aspirated and flushed well. The catheter was secured to skin with suture and the insert site was covered with bio patch and sterile dressing. Tunneled right IJ hemodialysis catheter removed without difficulty. Insertion site covered with sterile dressing. IMPRESSION: 1. Successful removal of tunneled right IJ hemodialysis catheter. 2. Successful exchange of tunneled left IJ power line. Exchange was performed with the patient to maintain central venous access. 3. Fistulagram should not be performed until patient has cleared his bacteremia. Electronically Signed   By: Miachel Roux M.D.   On: 10/19/2020 15:30    Imaging: Personally reviewed  A/P: Luis Orr is an 70 y.o. 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 Fib Diabetes type 2 Distal esophageal, gastric, duodenal and proximal jejunal pneumatosis with portal venous gas, SMV and perigastric vein air concerning for ischemia Probable acute stroke  Patient has extensive pneumatosis throughout his proximal foregut along with extensive portal venous gas concerning for ischemic event.  Patient has also had a further decline in his neurological status - primary declined CT head.   Lactate pending.   I do not believe patient is an operative candidate because of his overall medical condition especially with concerns of worsening neurological status.  He would not survive a gastrectomy and proximal small bowel resection with potential distal esophagectomy.  Risk of perioperative  mortality exceeds 63% (perNSQIP risk calculator) and probably underestimates his total risk given the change in his neurological function as well as his baseline status of being quadriplegic on chronic vent.  Recommend palliative/comfort care.    Leighton Ruff. Redmond Pulling, MD, FACS General, Bariatric, & Minimally Invasive Surgery [facility], [place]

## 2020-10-20 NOTE — Progress Notes (Signed)
[facility] for Infectious Disease    Date of Admission:  06/14/2020      ID: Luis Orr is a 70 y.o. 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 Had HD lines and central lines removed yesterday. WBC trending up on labs today. Exam + distended abdomen. RN has stopped TF temporarily, awaiting abd xray  Medications:   amiodarone  200 mg Per Tube Daily   buPROPion  75 mg Per Tube BID   chlorhexidine gluconate (MEDLINE KIT)  15 mL Mouth Rinse BID   Chlorhexidine Gluconate Cloth  6 each Topical Q0600   darbepoetin (ARANESP) injection - DIALYSIS  150 mcg Intravenous Q Tue-HD   feeding supplement (PROSource TF)  45 mL Per Tube TID   guaiFENesin  5 mL Per Tube BID   insulin aspart  0-9 Units Subcutaneous Q4H   insulin glargine-yfgn  30 Units Subcutaneous Daily   lidocaine  1 patch Transdermal Q24H   mouth rinse  15 mL Mouth Rinse 10 times per day   metaxalone  800 mg Per Tube TID   midodrine  5 mg Per Tube TID WC   multivitamin  1 tablet Per Tube QHS   oxyCODONE  5 mg Per Tube Q4H   pantoprazole (PROTONIX) IV  40 mg Intravenous Q12H   sertraline  100 mg Per Tube Daily   simethicone  40 mg Per Tube QID   sodium chloride flush  10-40 mL Intracatheter Q12H    Objective: Vital signs in last 24 hours: Temp:  [97.6 F (36.4 C)-98.3 F (36.8 C)] 97.6 F (36.4 C) (10/08  1106) Pulse Rate:  [83-112] 100 (10/08 1300) Resp:  [20-22] 20 (10/08 1316) BP: (83-163)/(62-107) 96/78 (10/08 1316) SpO2:  [95 %-100 %] 98 % (10/08 1300) FiO2 (%):  [30 %] 30 % (10/08 1316) Weight:  [79.7 kg] 79.7 kg (10/08 0500) Physical Exam  Constitutional: His eyes are open, disconjugate gaze. He appears well-developed and well-nourished. No distress.  HENT:  Mouth/Throat: Oropharynx is clear and dry; trach in place Cardiovascular: Normal rate, regular rhythm and normal heart sounds. Exam reveals no gallop and no friction rub.  No murmur heard.  Pulmonary/Chest: Effort normal and breath sounds normal. No respiratory distress. He has no wheezes.  Abdominal: Soft. Bowel sounds are decreased + marked distension. There is no tenderness.  Lymphadenopathy:  He has no cervical adenopathy.  Neurological: He is alert and oriented to person, place, and time.  Skin: Skin is warm and dry. No rash noted. No erythema.  Psychiatric: He has a normal mood and affect. His behavior is normal.    Lab Results Recent Labs    10/19/20 0850 10/20/20 0500  WBC 32.2* 46.3*  HGB 9.6* 9.1*  HCT 30.4* 28.3*  NA 130* 130*  K 3.7 3.8  CL 91* 90*  CO2 23 18*  BUN 58* 88*  CREATININE 1.12 1.39*   Liver Panel Recent Labs    10/18/20 0638 10/20/20 0500  PROT 7.5 8.8*  ALBUMIN 1.8* 2.0*  AST 41 35  ALT 22 28  ALKPHOS 243* 250*  BILITOT 1.1 1.5*  BILIDIR 0.2  --   IBILI 0.9  --    Sedimentation Rate Recent Labs    10/20/20 0500  ESRSEDRATE 130*   C-Reactive Protein Recent Labs    10/18/20 0826 10/20/20 0500  CRP 28.5* 29.9*    Microbiology:  Studies/Results: IR Fluoro Guide CV Line Left  Result Date: 10/19/2020 INDICATION: 69 year old gentleman with tunneled right IJ hemodialysis catheter, nonfunctioning dialysis AV fistula, and tunneled left IJ power line was found to have Pseudomonas bacteremia. IR consulted for removal of tunneled dialysis catheter for line holiday. Patient  still requires central venous access therefore tunneled left IJ power line was exchanged. EXAM: REMOVAL TUNNELED CENTRAL VENOUS CATHETER EXCHANGE OF LEFT IJ POWER LINE MEDICATIONS: Patient receiving antibiotics per in-patient protocol ANESTHESIA/SEDATION: None FLUOROSCOPY TIME:  Fluoroscopy Time: 1.2 minutes (12.4 mGy). COMPLICATIONS: None immediate. PROCEDURE: Informed written consent was obtained from the patient/patient representative after a thorough discussion of the procedural risks, benefits and alternatives. All questions were addressed. Maximal Sterile Barrier Technique was utilized including caps, mask, sterile gowns, sterile gloves, sterile drape, hand hygiene and skin antiseptic. A timeout was performed prior to the initiation of the procedure. Both tunneled hemodialysis catheter and power line were prepped and draped in usual fashion. The left IJ power line was removed over a 0.018 inch guidewire. New dual lumen power line was cut to the same length and inserted over the guidewire with tip positioned at the cavoatrial junction. Both lumens aspirated and flushed well. The catheter was secured to skin with suture and the insert site was covered with bio patch and sterile dressing. Tunneled right IJ hemodialysis catheter removed without difficulty. Insertion site covered with sterile dressing. IMPRESSION: 1. Successful removal of tunneled right IJ hemodialysis catheter. 2. Successful exchange of tunneled left IJ power line. Exchange was performed with the patient to maintain central venous access. 3. Fistulagram should not be performed until patient has cleared his bacteremia. Electronically Signed   By: Miachel Roux M.D.   On: 10/19/2020 15:30   IR Removal Tun Cv Cath W/O FL  Result Date: 10/19/2020 INDICATION: 70 year old gentleman with tunneled right IJ hemodialysis catheter, nonfunctioning dialysis AV fistula, and tunneled left IJ power line was found to have Pseudomonas bacteremia. IR consulted  for removal of tunneled dialysis catheter for line holiday. Patient still requires central venous access therefore tunneled left IJ power line was exchanged. EXAM: REMOVAL TUNNELED CENTRAL VENOUS CATHETER EXCHANGE OF LEFT IJ POWER LINE MEDICATIONS: Patient receiving antibiotics per in-patient protocol ANESTHESIA/SEDATION: None FLUOROSCOPY TIME:  Fluoroscopy Time: 1.2 minutes (12.4 mGy). COMPLICATIONS: None immediate. PROCEDURE: Informed written consent was obtained from the patient/patient representative after a thorough discussion of the procedural risks, benefits and alternatives. All questions were addressed. Maximal Sterile Barrier Technique was utilized including caps, mask, sterile gowns, sterile gloves, sterile drape, hand hygiene and skin antiseptic. A timeout was performed prior to the initiation of the procedure. Both tunneled hemodialysis catheter and power line were prepped and draped in usual fashion. The left IJ power line was removed over a 0.018 inch guidewire. New dual lumen power line was cut to the same length and inserted over the guidewire with tip positioned at the cavoatrial junction. Both lumens aspirated and flushed well. The catheter was secured to skin with suture  and the insert site was covered with bio patch and sterile dressing. Tunneled right IJ hemodialysis catheter removed without difficulty. Insertion site covered with sterile dressing. IMPRESSION: 1. Successful removal of tunneled right IJ hemodialysis catheter. 2. Successful exchange of tunneled left IJ power line. Exchange was performed with the patient to maintain central venous access. 3. Fistulagram should not be performed until patient has cleared his bacteremia. Electronically Signed   By: Miachel Roux M.D.   On: 10/19/2020 15:30     Assessment/Plan: Pseudomonal bacteremia = continue on piptazo. Repeat blood cx today  Leukocytosis = possibly due to abdominal process. Agree with starting with abd xray and may need CT.  Concern ileus.  Caren Griffins b Carmelo Reidel md [facility] for Infectious Diseases Pager: [phone]  10/20/2020, 2:59 PM

## 2020-10-20 NOTE — Progress Notes (Signed)
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  Patient presented secondary to altered mental status and hypotension with recurrent pneumonia requiring vasopressor support and ICU admission.  He has had multiple recent admission for sepsis secondary to various infectious including UTI, pneumonia and bacteremia in the setting of MDR Klebsiella, MRSA and Pseudomonas.  Most recent admission September 2021 in setting of sepsis due to MDR Klebsiella with right ureteral calculus status post double JJ ureteral stent placement and treated with meropenem for 10 days.  He was discharged back to [facility].   He was admitted 6/29 to the ICU with septic shock on Levophed.  Trach asp gross MDR Acinetobacter treated with meropenem.  Patient progress  to renal failure requiring renal replacement therapy.  Patient with prolonged hospital course.    Assessment & Plan:   Principal Problem:   Acute respiratory failure with hypoxia (HCC) Active Problems:   Ventilator dependent (HCC)   Sacral decubitus ulcer   Septic shock (HCC)   Pressure injury of skin   AKI (acute kidney injury) ([place])   Hypotension   Goals of care, counseling/discussion   Quadriplegia (HCC)   Chronic kidney disease requiring chronic dialysis ([place])   Hyperglycemia   Sepsis due to Acinetobacter baumannii ([place])   Pneumonia due to Acinetobacter species ([place])   Dysautonomia (HCC)   Chronic midline thoracic back pain   Abdominal distention   Leukocytosis   Bacteremia due to Pseudomonas  1-Sepsis /Pseudomonas Bacteremia;  WBC increase to 40. Blood culture growing pseudomonas.  On IV zosyn.  ID following.  Tunneled HD catheter removed 10/07, and central line tunneled was exchange for new line.  WBC increase today to 40, will  let ID know.   Maroon-colored stool;  Occult Blood positive.  Hb stable at 9.  Change protonix to IV.  Repeat labs tonight. Transfusion as needed.  Continue to hold Eliquis.  Discussed with GI, plan to monitor Hb. Patient with recent sepsis , bacteremia is not stable for endoscopy procedure.   Acute on chronic respiratory failure with hypoxia and hypercapnia: Chronic Vent Dependent.  Treated for PNA.Marland Kitchen      Recurrent ventilator associated pneumonia secondary to recurrent see no bacteremia/sepsis without shock -Completed antibiotics treatment.    AKI with oliguria/volume overload/new CKD requiring hemodialysis Continue with HD.  -Nephrology following.    Septic shock secondary to VAP POA -Resolved.    Chronic thoracic back pain: -Continue with pain management.    History of paroxysmal A. fib: On amiodarone.  Eliquis on hold due to concern for GI bleed.    Acute metabolic encephalopathy Secondary to acute illness.  Alert, Non verbal.     Diabetes type 2: Continue with Semglee and SSI.    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    Shock  liver: Resolved.    Quadriplegia  secondary to compressive myelopathy -support care.  -poor prognosis.    Macrocytic anemia: -S/p total 6 units of packed red blood cell during this hospitalization.   Severe protein caloric malnutrition: Continue with tube feeding PEG tube was replaced    Nodular irregularity ascending Colon.  GI was consulted. Patient is to high risk for colonoscopy.     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  Right/posterior/lateral ankle unknown if present on admission.  DTI left ear, not present on admission. (For additional documentation see wound care notes)     Pressure Injury 06/19/2020 Sacrum Posterior;Medial Stage 4 - Full thickness tissue loss with exposed bone, tendon or muscle. (Active)  07/04/2020 2240   Location: Sacrum  Location Orientation: Posterior;Medial  Staging: Stage 4 - Full thickness tissue loss with exposed bone, tendon or muscle.  Wound Description (Comments):   Present on Admission: Yes     Pressure Injury 07/23/20 Ear Left Deep Tissue Pressure Injury - Purple or maroon localized area of discolored intact skin or blood-filled blister due to damage of underlying soft tissue from pressure and/or shear. DTI left ear (Active)  07/23/20 2000  Location: Ear  Location Orientation: Left  Staging: Deep Tissue Pressure Injury - Purple or maroon localized area of discolored intact skin or blood-filled blister due to damage of underlying soft tissue from pressure and/or shear.  Wound Description (Comments): DTI left ear  Present on Admission: No     Pressure Injury 09/03/20 Heel Right Stage 2 -  Partial thickness loss of dermis presenting as a shallow open injury with a red, pink wound bed without slough. (Active)  09/03/20 0800  Location: Heel  Location Orientation: Right  Staging: Stage 2 -  Partial thickness loss of dermis presenting as a shallow open injury with a red, pink wound bed without slough.  Wound Description (Comments):   Present on Admission:      Nutrition Problem: Increased nutrient needs Etiology: wound healing    Signs/Symptoms: estimated needs    Interventions: Tube feeding, Prostat, MVI  Estimated body mass index is 28.36 kg/m as calculated from the following:   Height as of this encounter: _0  (1.676 m).   Weight as of this encounter: 79.7 kg.   DVT prophylaxis: SCD      Subjective: Alert, non verbal.   Objective: Vitals:   10/20/20 0500 10/20/20 0600 10/20/20 0757 10/20/20 0820  BP: (!) 149/88 (!) 145/82  134/77  Pulse: (!) 108 (!) 105    Resp: 20 20    Temp:   97.9 F (36.6 C)   TempSrc:   Oral   SpO2: 96% 96%  96%  Weight: 79.7 kg     Height:        Intake/Output Summary (Last 24 hours) at 10/20/2020 0906 Last data filed  at 10/19/2020 1800 Gross per 24 hour  Intake 507.5 ml  Output 25 ml  Net 482.5 ml   Filed Weights   10/18/20 1845 10/19/20 0500 10/20/20 0500  Weight: 76.1 kg 79.2 kg 79.7 kg    Examination:  General exam: alert, chronic ill appearing Respiratory system: CTA, trach, vent Cardiovascular system: S 1, S 2 RRR Gastrointestinal system: BS present, soft, nt, colostomy in place.  Central nervous system: alert   Data Reviewed: I have personally reviewed following labs and imaging studies  CBC: Recent Labs  Lab 10/18/20 0357 10/19/20 0850 10/20/20 0500  WBC 43.3* 32.2* 46.3*  NEUTROABS 38.7*  --  40.7*  HGB 8.1* 9.6* 9.1*  HCT 25.6* 30.4* 28.3*  MCV 75.5* 76.0* 74.3*  PLT 524* 596* 718*  161*   Basic Metabolic Panel: Recent Labs  Lab 10/15/20 0500 10/18/20 0638 10/19/20 0850 10/20/20 0500  NA 131* 131* 130* 130*  K 4.9 3.7 3.7 3.8  CL 91* 92* 91* 90*  CO2 _1 18*  GLUCOSE 136* 175* 221* 97  BUN 108* 94* 58* 88*  CREATININE 1.62* 1.64* 1.12 1.39*  CALCIUM 9.6 9.2  9.2 9.5  PHOS 6.0* 5.3*  --   --    GFR: Estimated Creatinine Clearance: 49.1 mL/min (A) (by C-G formula based on SCr of 1.39 mg/dL (H)). Liver Function Tests: Recent Labs  Lab 10/15/20 0500 10/18/20 0638 10/20/20 0500  AST  --  41 35  ALT  --  22 28  ALKPHOS  --  243* 250*  BILITOT  --  1.1 1.5*  PROT  --  7.5 8.8*  ALBUMIN 1.7* 1.8* 2.0*   No results for input(s): LIPASE, AMYLASE in the last 168 hours. No results for input(s): AMMONIA in the last 168 hours. Coagulation Profile: Recent Labs  Lab 10/20/20 0500  INR 1.4*   Cardiac Enzymes: No results for input(s): CKTOTAL, CKMB, CKMBINDEX, TROPONINI in the last 168 hours. BNP (last 3 results) No results for input(s): PROBNP in the last 8760 hours. HbA1C: No results for input(s): HGBA1C in the last 72 hours. CBG: Recent Labs  Lab 10/19/20 1529 10/19/20 1948 10/19/20 2316 10/20/20 0344 10/20/20 0754  GLUCAP 185* 231* 243* 120*  114*   Lipid Profile: No results for input(s): CHOL, HDL, LDLCALC, TRIG, CHOLHDL, LDLDIRECT in the last 72 hours. Thyroid Function Tests: No results for input(s): TSH, T4TOTAL, FREET4, T3FREE, THYROIDAB in the last 72 hours. Anemia Panel: No results for input(s): VITAMINB12, FOLATE, FERRITIN, TIBC, IRON, RETICCTPCT in the last 72 hours. Sepsis Labs: No results for input(s): PROCALCITON, LATICACIDVEN in the last 168 hours.  Recent Results (from the past 240 hour(s))  Culture, blood (routine x 2)     Status: None (Preliminary result)   Collection Time: 10/18/20  6:38 AM   Specimen: BLOOD  Result Value Ref Range Status   Specimen Description BLOOD RIGHT ANTECUBITAL  Final   Special Requests IN PEDIATRIC BOTTLE Blood Culture adequate volume  Final   Culture  Setup Time   Final    GRAM NEGATIVE RODS AEROBIC BOTTLE ONLY CRITICAL VALUE NOTED.  VALUE IS CONSISTENT WITH PREVIOUSLY REPORTED AND CALLED VALUE.    Culture   Final    NO GROWTH 1 DAY Performed at [facility], [place] [address], [place], [place] [address]    Report Status PENDING  Incomplete  Culture, blood (routine x 2)     Status: None (Preliminary result)   Collection Time: 10/18/20  6:40 AM   Specimen: BLOOD RIGHT FOREARM  Result Value Ref Range Status   Specimen Description BLOOD RIGHT FOREARM  Final   Special Requests IN PEDIATRIC BOTTLE Blood Culture adequate volume  Final   Culture  Setup Time   Final    GRAM NEGATIVE RODS AEROBIC BOTTLE ONLY CRITICAL RESULT CALLED TO, READ BACK BY AND VERIFIED WITH: PHARMD GREG ABBOTT 10/19/20_0 :20 BY TW    Culture   Final    GRAM NEGATIVE RODS CULTURE REINCUBATED FOR BETTER GROWTH Performed at [facility], [place] [address], [place], [place] [address]    Report Status PENDING  Incomplete  Blood Culture ID Panel (Reflexed)     Status: Abnormal   Collection Time: 10/18/20  6:40 AM  Result Value Ref Range Status   Enterococcus faecalis NOT DETECTED NOT DETECTED Final    Enterococcus Faecium NOT DETECTED NOT DETECTED Final   Listeria monocytogenes NOT DETECTED NOT DETECTED Final   Staphylococcus species NOT DETECTED NOT DETECTED Final   Staphylococcus aureus (BCID) NOT DETECTED NOT DETECTED Final   Staphylococcus epidermidis NOT DETECTED NOT DETECTED Final   Staphylococcus lugdunensis NOT DETECTED NOT DETECTED Final   Streptococcus  species NOT DETECTED NOT DETECTED Final   Streptococcus agalactiae NOT DETECTED NOT DETECTED Final   Streptococcus pneumoniae NOT DETECTED NOT DETECTED Final   Streptococcus pyogenes NOT DETECTED NOT DETECTED Final   A.calcoaceticus-baumannii NOT DETECTED NOT DETECTED Final   Bacteroides fragilis NOT DETECTED NOT DETECTED Final   Enterobacterales NOT DETECTED NOT DETECTED Final   Enterobacter cloacae complex NOT DETECTED NOT DETECTED Final   Escherichia coli NOT DETECTED NOT DETECTED Final   Klebsiella aerogenes NOT DETECTED NOT DETECTED Final   Klebsiella oxytoca NOT DETECTED NOT DETECTED Final   Klebsiella pneumoniae NOT DETECTED NOT DETECTED Final   Proteus species NOT DETECTED NOT DETECTED Final   Salmonella species NOT DETECTED NOT DETECTED Final   Serratia marcescens NOT DETECTED NOT DETECTED Final   Haemophilus influenzae NOT DETECTED NOT DETECTED Final   Neisseria meningitidis NOT DETECTED NOT DETECTED Final   Pseudomonas aeruginosa DETECTED (A) NOT DETECTED Final    Comment: CRITICAL RESULT CALLED TO, READ BACK BY AND VERIFIED WITH: PHARMD GREG ABBOTT 10/19/20_0 :20 BY TW    Stenotrophomonas maltophilia NOT DETECTED NOT DETECTED Final   Candida albicans NOT DETECTED NOT DETECTED Final   Candida auris NOT DETECTED NOT DETECTED Final   Candida glabrata NOT DETECTED NOT DETECTED Final   Candida krusei NOT DETECTED NOT DETECTED Final   Candida parapsilosis NOT DETECTED NOT DETECTED Final   Candida tropicalis NOT DETECTED NOT DETECTED Final   Cryptococcus neoformans/gattii NOT DETECTED NOT DETECTED Final   CTX-M ESBL  NOT DETECTED NOT DETECTED Final   Carbapenem resistance IMP NOT DETECTED NOT DETECTED Final   Carbapenem resistance KPC NOT DETECTED NOT DETECTED Final   Carbapenem resistance NDM NOT DETECTED NOT DETECTED Final   Carbapenem resistance VIM NOT DETECTED NOT DETECTED Final    Comment: Performed at [facility], [address], [place], [place] [address]  Culture, Respiratory w Gram Stain     Status: None (Preliminary result)   Collection Time: 10/18/20  8:59 AM   Specimen: Tracheal Aspirate; Respiratory  Result Value Ref Range Status   Specimen Description TRACHEAL ASPIRATE  Final   Special Requests NONE  Final   Gram Stain   Final    RARE WBC PRESENT,BOTH PMN AND MONONUCLEAR RARE GRAM POSITIVE RODS    Culture   Final    CULTURE REINCUBATED FOR BETTER GROWTH Performed at [facility], [place] [address] [place], [place] [address]    Report Status PENDING  Incomplete         Radiology Studies: IR Fluoro Guide CV Line Left  Result Date: 10/19/2020 INDICATION: 70 year old gentleman with tunneled right IJ hemodialysis catheter, nonfunctioning dialysis AV fistula, and tunneled left IJ power line was found to have Pseudomonas bacteremia. IR consulted for removal of tunneled dialysis catheter for line holiday. Patient still requires central venous access therefore tunneled left IJ power line was exchanged. EXAM: REMOVAL TUNNELED CENTRAL VENOUS CATHETER EXCHANGE OF LEFT IJ POWER LINE MEDICATIONS: Patient receiving antibiotics per in-patient protocol ANESTHESIA/SEDATION: None FLUOROSCOPY TIME:  Fluoroscopy Time: 1.2 minutes (12.4 mGy). COMPLICATIONS: None immediate. PROCEDURE: Informed written consent was obtained from the patient/patient representative after a thorough discussion of the procedural risks, benefits and alternatives. All questions were addressed. Maximal Sterile Barrier Technique was utilized including caps, mask, sterile gowns, sterile gloves, sterile drape, hand hygiene  and skin antiseptic. A timeout was performed prior to the initiation of the procedure. Both tunneled hemodialysis catheter and power line were prepped and draped in usual fashion. The left IJ  power line was removed over a 0.018 inch guidewire. New dual lumen power line was cut to the same length and inserted over the guidewire with tip positioned at the cavoatrial junction. Both lumens aspirated and flushed well. The catheter was secured to skin with suture and the insert site was covered with bio patch and sterile dressing. Tunneled right IJ hemodialysis catheter removed without difficulty. Insertion site covered with sterile dressing. IMPRESSION: 1. Successful removal of tunneled right IJ hemodialysis catheter. 2. Successful exchange of tunneled left IJ power line. Exchange was performed with the patient to maintain central venous access. 3. Fistulagram should not be performed until patient has cleared his bacteremia. Electronically Signed   By: Miachel Roux M.D.   On: 10/19/2020 15:30   IR Removal Tun Cv Cath W/O FL  Result Date: 10/19/2020 INDICATION: 70 year old gentleman with tunneled right IJ hemodialysis catheter, nonfunctioning dialysis AV fistula, and tunneled left IJ power line was found to have Pseudomonas bacteremia. IR consulted for removal of tunneled dialysis catheter for line holiday. Patient still requires central venous access therefore tunneled left IJ power line was exchanged. EXAM: REMOVAL TUNNELED CENTRAL VENOUS CATHETER EXCHANGE OF LEFT IJ POWER LINE MEDICATIONS: Patient receiving antibiotics per in-patient protocol ANESTHESIA/SEDATION: None FLUOROSCOPY TIME:  Fluoroscopy Time: 1.2 minutes (12.4 mGy). COMPLICATIONS: None immediate. PROCEDURE: Informed written consent was obtained from the patient/patient representative after a thorough discussion of the procedural risks, benefits and alternatives. All questions were addressed. Maximal Sterile Barrier Technique was utilized including caps,  mask, sterile gowns, sterile gloves, sterile drape, hand hygiene and skin antiseptic. A timeout was performed prior to the initiation of the procedure. Both tunneled hemodialysis catheter and power line were prepped and draped in usual fashion. The left IJ power line was removed over a 0.018 inch guidewire. New dual lumen power line was cut to the same length and inserted over the guidewire with tip positioned at the cavoatrial junction. Both lumens aspirated and flushed well. The catheter was secured to skin with suture and the insert site was covered with bio patch and sterile dressing. Tunneled right IJ hemodialysis catheter removed without difficulty. Insertion site covered with sterile dressing. IMPRESSION: 1. Successful removal of tunneled right IJ hemodialysis catheter. 2. Successful exchange of tunneled left IJ power line. Exchange was performed with the patient to maintain central venous access. 3. Fistulagram should not be performed until patient has cleared his bacteremia. Electronically Signed   By: Miachel Roux M.D.   On: 10/19/2020 15:30        Scheduled Meds:  amiodarone  200 mg Per Tube Daily   buPROPion  75 mg Per Tube BID   chlorhexidine gluconate (MEDLINE KIT)  15 mL Mouth Rinse BID   Chlorhexidine Gluconate Cloth  6 each Topical Q0600   darbepoetin (ARANESP) injection - DIALYSIS  150 mcg Intravenous Q Tue-HD   feeding supplement (PROSource TF)  45 mL Per Tube TID   guaiFENesin  5 mL Per Tube BID   insulin aspart  0-9 Units Subcutaneous Q4H   insulin glargine-yfgn  30 Units Subcutaneous Daily   lidocaine  1 patch Transdermal Q24H   mouth rinse  15 mL Mouth Rinse 10 times per day   metaxalone  800 mg Per Tube TID   midodrine  5 mg Per Tube TID WC   multivitamin  1 tablet Per Tube QHS   oxyCODONE  5 mg Per Tube Q4H   pantoprazole sodium  40 mg Per Tube Daily   sertraline  100 mg  Per Tube Daily   simethicone  40 mg Per Tube QID   sodium chloride flush  10-40 mL Intracatheter  Q12H   Continuous Infusions:  sodium chloride 250 mL (10/19/20 1657)   feeding supplement (NEPRO CARB STEADY) 45 mL/hr at 10/20/20 0500   piperacillin-tazobactam (ZOSYN)  IV 2.25 g (10/20/20 0903)     LOS: 101 days    Time spent: 35 minutes    Keefer Soulliere A Jelicia Nantz, MD [facility]   If 7PM-7AM, please contact night-coverage [email or web address]  10/20/2020, 9:06 AM

## 2020-10-20 NOTE — Progress Notes (Signed)
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  Presented from [place] with AMS  hypotension and pneumonia resultant septic shock is admitted to the intensive care unit.  Has had multiple admissions in the past for Klebsiella, MRSA and Pseudomonas urinary tract infections and pneumonia.  His most recent admission 10/03/2020 with multiple drug-resistant Klebsiella and right ureteral calculus requiring double-J stent.  Renal replacement therapy was initiated 07/18/2020.       Assessment/ Plan:   1.Acute kidney Injury-prolonged dialysis dependency, now progressed to ESRD:  ATN from septic shock-without any evidence of renal recovery to date . initiated on CRRT 07/18/20 and transitioned to IHD.  Has TDC & AVG but TDC now out due to PsA bacteremia.  Plan to try to use AVG today for dialysis but if not successful will need new HD catheter.  Tol last HD Thurs  with 2.5L UF tolerated with profile 2, low temp and pre HD midodrine for BP support.  Try to avoid albumin as not available outpt.    2 Vascular access - had left forearm AVG placed 08/19/2020 but unfortunately had clotted.  s/p thrombectomy and revision 9/2. Tolerated  new cannulation on 9/28, used 10/4 but not used 10/6 due to arm swelling.  IR to do shuntogram Monday (needed to be off Eliquis prior) due to arm edema.  RIJ TDC now out due to bacteremia so if the AVG is not useable we would need to proceed with a new line.   3 Septic shock:  h/o VAP due to recurrent Acinetobacter infection: now with worsening leukocytosis and low grade fever with +bacteremia pseudomonas ; ID following  4 Chronic respiratory failure: Status post tracheostomy with ventilator management per CCM.   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   6 Chronic kidney disease/metabolic bone disease: Corr Calcium  high - suspect immobility with low PTH - no ca supplements, low ca dialsate.  Phosphorus level under control with hemodialysis, off binders. PTH 26 on 10/1   7 Anemia: contiue ESA increased to 157mg starting 10/4, monitor Hb.  Transfuse as needed.    8 Acute metabolic encephalopathy: Continue management as above.,  Supportive care.  9Disposition - pt cannot return to [facility] now that he is ESRD. [place] & his AVG needs to be mature prior to being accepted. Barrier is pt is a permanent trach & vent pt.  Not ready for d/c.     Subjective:   PsA in blood cultures detected and lines have been removed.  ID consulting.  LUE remains swollen.    Objective:   BP 134/77   Pulse (!) 105   Temp 97.6 F (36.4 C) (Oral)   Resp 20   Ht 5' 6"  (1.676 m)   Wt 79.7 kg   SpO2 96%   BMI 28.36 kg/m   Intake/Output Summary (Last 24 hours) at 10/20/2020 1108 Last data filed at 10/19/2020 1800 Gross per 24 hour  Intake 367.5 ml  Output 25 ml  Net 342.5 ml    Weight change: 1.1 kg  Physical Exam: General:NAD, tracheostomy on vent. Heart: normal rate, s1s2 nl Lungs: trach, no increased work of breathing. Abdomen:soft, Non-tender, did not eval PEG Extremities: Trace lower extremities edema Neuro: awake Dialysis Access: RIJ TDC out. Lt FAL AVG decreased bruit and 2+ pitting edema noted of L  arm.    Sutures intact at [facility] fossa incision.  No erythema or drainage noted at AVG.   Imaging: IR Fluoro Guide CV Line Left  Result Date: 10/19/2020 INDICATION: 70 year old gentleman with tunneled right IJ hemodialysis catheter, nonfunctioning dialysis AV fistula, and tunneled left IJ power line was found to have Pseudomonas bacteremia. IR consulted for removal of tunneled dialysis catheter for line holiday. Patient still requires central venous access therefore tunneled left IJ power line was exchanged. EXAM: REMOVAL TUNNELED CENTRAL VENOUS CATHETER  EXCHANGE OF LEFT IJ POWER LINE MEDICATIONS: Patient receiving antibiotics per in-patient protocol ANESTHESIA/SEDATION: None FLUOROSCOPY TIME:  Fluoroscopy Time: 1.2 minutes (12.4 mGy). COMPLICATIONS: None immediate. PROCEDURE: Informed written consent was obtained from the patient/patient representative after a thorough discussion of the procedural risks, benefits and alternatives. All questions were addressed. Maximal Sterile Barrier Technique was utilized including caps, mask, sterile gowns, sterile gloves, sterile drape, hand hygiene and skin antiseptic. A timeout was performed prior to the initiation of the procedure. Both tunneled hemodialysis catheter and power line were prepped and draped in usual fashion. The left IJ power line was removed over a 0.018 inch guidewire. New dual lumen power line was cut to the same length and inserted over the guidewire with tip positioned at the cavoatrial junction. Both lumens aspirated and flushed well. The catheter was secured to skin with suture and the insert site was covered with bio patch and sterile dressing. Tunneled right IJ hemodialysis catheter removed without difficulty. Insertion site covered with sterile dressing. IMPRESSION: 1. Successful removal of tunneled right IJ hemodialysis catheter. 2. Successful exchange of tunneled left IJ power line. Exchange was performed with the patient to maintain central venous access. 3. Fistulagram should not be performed until patient has cleared his bacteremia. Electronically Signed   By: Miachel Roux M.D.   On: 10/19/2020 15:30   IR Removal Tun Cv Cath W/O FL  Result Date: 10/19/2020 INDICATION: 70 year old gentleman with tunneled right IJ hemodialysis catheter, nonfunctioning dialysis AV fistula, and tunneled left IJ power line was found to have Pseudomonas bacteremia. IR consulted for removal of tunneled dialysis catheter for line holiday. Patient still requires central venous access therefore tunneled left IJ power  line was exchanged. EXAM: REMOVAL TUNNELED CENTRAL VENOUS CATHETER EXCHANGE OF LEFT IJ POWER LINE MEDICATIONS: Patient receiving antibiotics per in-patient protocol ANESTHESIA/SEDATION: None FLUOROSCOPY TIME:  Fluoroscopy Time: 1.2 minutes (12.4 mGy). COMPLICATIONS: None immediate. PROCEDURE: Informed written consent was obtained from the patient/patient representative after a thorough discussion of the procedural risks, benefits and alternatives. All questions were addressed. Maximal Sterile Barrier Technique was utilized including caps, mask, sterile gowns, sterile gloves, sterile drape, hand hygiene and skin antiseptic. A timeout was performed prior to the initiation of the procedure. Both tunneled hemodialysis catheter and power line were prepped and draped in usual fashion. The left IJ power line was removed over a 0.018 inch guidewire. New dual lumen power line was cut to the same length and inserted over the guidewire with tip positioned at the cavoatrial junction. Both lumens aspirated and flushed well. The catheter was secured to skin with suture and the insert site was covered with bio patch and sterile dressing. Tunneled right IJ hemodialysis catheter removed without difficulty. Insertion site covered with sterile dressing. IMPRESSION: 1. Successful removal of tunneled right IJ hemodialysis catheter. 2. Successful exchange of tunneled left IJ power line. Exchange was performed with the patient to maintain central venous access. 3. Fistulagram should not be performed until patient has cleared his bacteremia.  Electronically Signed   By: Miachel Roux M.D.   On: 10/19/2020 15:30    Labs: BMET Recent Labs  Lab 10/15/20 0500 10/18/20 0638 10/19/20 0850 10/20/20 0500  NA 131* 131* 130* 130*  K 4.9 3.7 3.7 3.8  CL 91* 92* 91* 90*  CO2 22 22 23  18*  GLUCOSE 136* 175* 221* 97  BUN 108* 94* 58* 88*  CREATININE 1.62* 1.64* 1.12 1.39*  CALCIUM 9.6 9.2 9.2 9.5  PHOS 6.0* 5.3*  --   --      CBC Recent Labs  Lab 10/18/20 0357 10/19/20 0850 10/20/20 0500  WBC 43.3* 32.2* 46.3*  NEUTROABS 38.7*  --  40.7*  HGB 8.1* 9.6* 9.1*  HCT 25.6* 30.4* 28.3*  MCV 75.5* 76.0* 74.3*  PLT 524* 596* 718*  718*     Medications:     amiodarone  200 mg Per Tube Daily   buPROPion  75 mg Per Tube BID   chlorhexidine gluconate (MEDLINE KIT)  15 mL Mouth Rinse BID   Chlorhexidine Gluconate Cloth  6 each Topical Q0600   darbepoetin (ARANESP) injection - DIALYSIS  150 mcg Intravenous Q Tue-HD   feeding supplement (PROSource TF)  45 mL Per Tube TID   guaiFENesin  5 mL Per Tube BID   insulin aspart  0-9 Units Subcutaneous Q4H   insulin glargine-yfgn  30 Units Subcutaneous Daily   lidocaine  1 patch Transdermal Q24H   mouth rinse  15 mL Mouth Rinse 10 times per day   metaxalone  800 mg Per Tube TID   midodrine  5 mg Per Tube TID WC   multivitamin  1 tablet Per Tube QHS   oxyCODONE  5 mg Per Tube Q4H   pantoprazole (PROTONIX) IV  40 mg Intravenous Q12H   sertraline  100 mg Per Tube Daily   simethicone  40 mg Per Tube QID   sodium chloride flush  10-40 mL Intracatheter Q12H      Justin Mend  10/20/2020, 11:08 AM

## 2020-10-20 NOTE — Progress Notes (Signed)
Patient developed worsening abdominal distention today.  KUB show distention of the small bowel.  We proceeded with CT abdomen and pelvis which show: Pneumatosis of gastric wall and duodenum extending into proximal jejunum and distal esophagus, associated with significant gas within perigastric and mesenteric veins and extensive portal venous gas; findings are concerning for ischemia.  Nurse reported that patient developed upward right gaze and left facial droop.  I came to evaluate the patient his nonverbal with upward right gaze.  Abdomen is more distended and rigid.  -Patient is not a candidate for tPA due to concern for bowel ischemia and high risk for bleeding.  He is not a candidate for intervention either due to intra-abdominal process and already patient baseline was bedbound and nonverbal.  -I have consulted Dr. Redmond Pulling with general surgery, but patient is likely not a candidate for surgical procedure.  -I have contacted patient's  wife and informed  her critical and ill condition of Mr. Quiring.   I recommend comfort care and morphine drip.  She will discuss with her son and they want to come to the hospital.  Expect hospital death.   Niel Hummer, MD.

## 2020-10-20 NOTE — Progress Notes (Signed)
Assisted family with tele-visit via elink 

## 2020-10-21 DIAGNOSIS — J9601 Acute respiratory failure with hypoxia: Secondary | ICD-10-CM | POA: Diagnosis not present

## 2020-10-21 LAB — POCT I-STAT EG7
Acid-base deficit: 15 mmol/L — ABNORMAL HIGH (ref 0.0–2.0)
Acid-base deficit: 1 mmol/L (ref 0.0–2.0)
Bicarbonate: 14.6 mmol/L — ABNORMAL LOW (ref 20.0–28.0)
Bicarbonate: 25.3 mmol/L (ref 20.0–28.0)
Calcium, Ion: 1.14 mmol/L — ABNORMAL LOW (ref 1.15–1.40)
Calcium, Ion: 0.99 mmol/L — ABNORMAL LOW (ref 1.15–1.40)
HCT: 32 % — ABNORMAL LOW (ref 39.0–52.0)
HCT: 33 % — ABNORMAL LOW (ref 39.0–52.0)
Hemoglobin: 10.9 g/dL — ABNORMAL LOW (ref 13.0–17.0)
Hemoglobin: 11.2 g/dL — ABNORMAL LOW (ref 13.0–17.0)
O2 Saturation: 52 %
O2 Saturation: 71 %
Patient temperature: 97.6
Patient temperature: 97.6
Potassium: 7.3 mmol/L (ref 3.5–5.1)
Potassium: 5.9 mmol/L — ABNORMAL HIGH (ref 3.5–5.1)
Sodium: 133 mmol/L — ABNORMAL LOW (ref 135–145)
Sodium: 140 mmol/L (ref 135–145)
TCO2: 16 mmol/L — ABNORMAL LOW (ref 22–32)
TCO2: 27 mmol/L (ref 22–32)
pCO2, Ven: 51 mmHg (ref 44.0–60.0)
pCO2, Ven: 49.1 mmHg (ref 44.0–60.0)
pH, Ven: 7.061 — CL (ref 7.250–7.430)
pH, Ven: 7.317 (ref 7.250–7.430)
pO2, Ven: 38 mmHg (ref 32.0–45.0)
pO2, Ven: 40 mmHg (ref 32.0–45.0)

## 2020-10-21 LAB — BLOOD GAS, VENOUS
Acid-base deficit: 9.8 mmol/L — ABNORMAL HIGH (ref 0.0–2.0)
Bicarbonate: 16.2 mmol/L — ABNORMAL LOW (ref 20.0–28.0)
Drawn by: 164
FIO2: 60
O2 Saturation: 75 %
Patient temperature: 37
pCO2, Ven: 39.4 mmHg — ABNORMAL LOW (ref 44.0–60.0)
pH, Ven: 7.239 — ABNORMAL LOW (ref 7.250–7.430)
pO2, Ven: 49.7 mmHg — ABNORMAL HIGH (ref 32.0–45.0)

## 2020-10-21 LAB — BASIC METABOLIC PANEL
Anion gap: 27 — ABNORMAL HIGH (ref 5–15)
Anion gap: 35 — ABNORMAL HIGH (ref 5–15)
BUN: 97 mg/dL — ABNORMAL HIGH (ref 8–23)
BUN: 96 mg/dL — ABNORMAL HIGH (ref 8–23)
CO2: 22 mmol/L (ref 22–32)
CO2: 19 mmol/L — ABNORMAL LOW (ref 22–32)
Calcium: 9 mg/dL (ref 8.9–10.3)
Calcium: 9.4 mg/dL (ref 8.9–10.3)
Chloride: 90 mmol/L — ABNORMAL LOW (ref 98–111)
Chloride: 87 mmol/L — ABNORMAL LOW (ref 98–111)
Creatinine, Ser: 1.78 mg/dL — ABNORMAL HIGH (ref 0.61–1.24)
Creatinine, Ser: 1.9 mg/dL — ABNORMAL HIGH (ref 0.61–1.24)
GFR, Estimated: 41 mL/min — ABNORMAL LOW (ref >60)
GFR, Estimated: 37 mL/min — ABNORMAL LOW (ref >60)
Glucose, Bld: 140 mg/dL — ABNORMAL HIGH (ref 70–99)
Glucose, Bld: 105 mg/dL — ABNORMAL HIGH (ref 70–99)
Potassium: 6.4 mmol/L (ref 3.5–5.1)
Potassium: 6.2 mmol/L — ABNORMAL HIGH (ref 3.5–5.1)
Sodium: 139 mmol/L (ref 135–145)
Sodium: 141 mmol/L (ref 135–145)

## 2020-10-21 LAB — GLUCOSE, CAPILLARY
Glucose-Capillary: 149 mg/dL — ABNORMAL HIGH (ref 70–99)
Glucose-Capillary: 95 mg/dL (ref 70–99)

## 2020-10-21 LAB — LACTIC ACID, PLASMA: Lactic Acid, Venous: >9 mmol/L (ref 0.5–1.9)

## 2020-10-21 MED ORDER — DEXTROSE 5 % IV SOLN: INTRAVENOUS | Status: DC

## 2020-10-21 MED ORDER — NOREPINEPHRINE 4 MG/250ML-% IV SOLN
0.0000 ug/min | INTRAVENOUS | Status: DC
  Administered 2020-10-21 (×2): 40 ug/min via INTRAVENOUS
  Administered 2020-10-21: 45 ug/min via INTRAVENOUS
  Administered 2020-10-21: 10 ug/min via INTRAVENOUS
  Administered 2020-10-21: 40 ug/min via INTRAVENOUS
  Filled 2020-10-21 (×5): qty 250

## 2020-10-21 MED ORDER — SODIUM BICARBONATE 8.4 % IV SOLN
100.0000 meq | Freq: Once | INTRAVENOUS | Status: AC
  Administered 2020-10-21: 100 meq via INTRAVENOUS
  Filled 2020-10-21: qty 50

## 2020-10-21 MED ORDER — ACETAMINOPHEN 325 MG PO TABS: 650.0000 mg | ORAL_TABLET | Freq: Four times a day (QID) | ORAL | Status: DC | PRN

## 2020-10-21 MED ORDER — SODIUM BICARBONATE 8.4 % IV SOLN
INTRAVENOUS | Status: AC
  Administered 2020-10-21: 100 meq via INTRAVENOUS
  Filled 2020-10-21: qty 50

## 2020-10-21 MED ORDER — SODIUM BICARBONATE 8.4 % IV SOLN: 100.0000 meq | Freq: Once | INTRAVENOUS | Status: AC

## 2020-10-21 MED ORDER — STERILE WATER FOR INJECTION IV SOLN
INTRAVENOUS | Status: DC
  Filled 2020-10-21 (×2): qty 1000

## 2020-10-21 MED ORDER — ATROPINE SULFATE 1 MG/10ML IJ SOSY: 0.5000 mg | PREFILLED_SYRINGE | Freq: Once | INTRAMUSCULAR | Status: DC

## 2020-10-21 MED ORDER — DEXTROSE 50 % IV SOLN
1.0000 | Freq: Once | INTRAVENOUS | Status: AC
  Administered 2020-10-21: 50 mL via INTRAVENOUS
  Filled 2020-10-21: qty 50

## 2020-10-21 MED ORDER — ATROPINE SULFATE 1 MG/10ML IJ SOSY
PREFILLED_SYRINGE | INTRAMUSCULAR | Status: AC
  Administered 2020-10-21: 0.5 mg via INTRAVENOUS
  Filled 2020-10-21: qty 10

## 2020-10-21 MED ORDER — CALCIUM GLUCONATE-NACL 1-0.675 GM/50ML-% IV SOLN
1.0000 g | Freq: Once | INTRAVENOUS | Status: AC
  Administered 2020-10-21: 1000 mg via INTRAVENOUS
  Filled 2020-10-21: qty 50

## 2020-10-21 MED ORDER — VASOPRESSIN 20 UNITS/100 ML INFUSION FOR SHOCK
0.0000 [IU]/min | INTRAVENOUS | Status: DC
  Administered 2020-10-21: 0.04 [IU]/min via INTRAVENOUS
  Filled 2020-10-21: qty 100

## 2020-10-21 MED ORDER — SODIUM BICARBONATE 8.4 % IV SOLN
INTRAVENOUS | Status: AC
  Administered 2020-10-21: 100 meq via INTRAVENOUS
  Filled 2020-10-21: qty 100

## 2020-10-21 MED ORDER — POLYVINYL ALCOHOL 1.4 % OP SOLN
1.0000 [drp] | Freq: Four times a day (QID) | OPHTHALMIC | Status: DC | PRN
  Filled 2020-10-21: qty 15

## 2020-10-21 MED ORDER — ATROPINE SULFATE 1 MG/10ML IJ SOSY: 0.5000 mg | PREFILLED_SYRINGE | Freq: Once | INTRAMUSCULAR | Status: AC

## 2020-10-21 MED ORDER — CHLORHEXIDINE GLUCONATE 0.12 % MT SOLN
OROMUCOSAL | Status: AC
  Filled 2020-10-21: qty 15

## 2020-10-21 MED ORDER — GLYCOPYRROLATE 1 MG PO TABS: 1.0000 mg | ORAL_TABLET | ORAL | Status: DC | PRN

## 2020-10-21 MED ORDER — GLYCOPYRROLATE 0.2 MG/ML IJ SOLN: 0.2000 mg | INTRAMUSCULAR | Status: DC | PRN

## 2020-10-21 MED ORDER — DIPHENHYDRAMINE HCL 50 MG/ML IJ SOLN: 25.0000 mg | INTRAMUSCULAR | Status: DC | PRN

## 2020-10-21 MED ORDER — LACTATED RINGERS IV BOLUS
500.0000 mL | Freq: Once | INTRAVENOUS | Status: AC
  Administered 2020-10-21: 500 mL via INTRAVENOUS

## 2020-10-21 MED ORDER — SODIUM BICARBONATE 8.4 % IV SOLN: 100.0000 meq | Freq: Once | INTRAVENOUS | Status: DC

## 2020-10-21 MED ORDER — INSULIN ASPART 100 UNIT/ML IV SOLN
10.0000 [IU] | Freq: Once | INTRAVENOUS | Status: AC
  Administered 2020-10-21: 10 [IU] via INTRAVENOUS

## 2020-10-21 MED ORDER — ACETAMINOPHEN 650 MG RE SUPP: 650.0000 mg | Freq: Four times a day (QID) | RECTAL | Status: DC | PRN

## 2020-10-21 MED ORDER — FENTANYL CITRATE (PF) 100 MCG/2ML IJ SOLN
25.0000 ug | INTRAMUSCULAR | Status: DC | PRN
  Administered 2020-10-21: 100 ug via INTRAVENOUS
  Filled 2020-10-21: qty 2

## 2020-10-23 LAB — PATHOLOGIST SMEAR REVIEW

## 2020-10-25 LAB — CULTURE, BLOOD (ROUTINE X 2)
Culture: NO GROWTH
Culture: NO GROWTH
Special Requests: ADEQUATE
Special Requests: ADEQUATE
Special Requests: ADEQUATE
Special Requests: ADEQUATE

## 2020-11-13 NOTE — Progress Notes (Signed)
[facility] Progress Note Patient Name: Luis Orr DOB: 12-03-1950 MRN: [id number]   Date of Service  11-06-20  HPI/Events of Note  Hypotension - BP = 59/33.  eICU Interventions  Plan: NaHCO3 100 meq IV X 1 now.  Norepinephrine IV infusion. Titrate to MAP >= 65.     Intervention Category Major Interventions: Hypotension - evaluation and management  [name] 11-06-20, 5:25 AM

## 2020-11-13 NOTE — Progress Notes (Signed)
NAME:  Luis Orr, MRN:  [id number], DOB:  01/01/1951, LOS: 32 ADMISSION DATE:  06/30/2020, CONSULTATION DATE:  06/24/2020 REFERRING MD:  Dr Francia Greaves - EDP CHIEF COMPLAINT:  Septic shock    History of Present Illness:  70 year old man from [facility] with history of C3-C5 compressive myelopathy with resultant functional quadriplegia and chronic trach/vent who presented to ED with altered mental status and hypotension with recurrent HCAP.  Has multiple recent admissions for sepsis secondary to various infections including UTIs, pneumonias and bacteremia in setting of MDR Klebsiella, MRSA and pseudomonas. Most recent admission September 2021 in setting of sepsis due to MDR Klebsiella with right ureteral calculus, s/p double J ureteral stent placement and treated with meropenem x 10 days.  PCCM following for chronic tracheostomy/vent management.  Pertinent Medical History:  Compressive Myelopathy with Functional Quadriplegia  Chronic Respiratory Failure s/p Trach with Vent Dependence  Anemia  DM II  HTN  Urinary Retention with chronic foley  Renal Calculi  Frequent PNA Chronic [facility] Resident   Significant Hospital Events:  6/29 admitted to ICU for septic shock on levo to maintain MAP>65, continued on vent support  6/29 Blood Cx>> staph species in 1 of 4 cultures drawn>> suspect contaminant 6/30 Off pressors 7/2 ID Consult for MDR acinetobacter in trach asp, Meropenem changed to unasyn 7/2 PRBC transfusion, iron replacement  7/5 Pressors added again. Central line placed. Drainage from gastrostomy tube >> CT Abd without fluid collection around gastrostomy tube but with diffuse body wall edema. Echo EF 50-55%, mild LVH  7/6 Renal Replacement Therapy ; Code status changed to partial Code (DNR) 7/7 Transfuse 1U PRBC 7/9 add solu cortef 7/10 off pressors, weaning stress steroids  7/11 Pressors added back on again 7/12 ID consult due to rising WBC, hypothermia, hypotension concern  for worsening infection. Vancomycin added. Repeat blood cx collected. 7/15 CRRT stopped  7/17 Restarted on low dose levo. No evidence of new infection. Held off on antibiotics. Nephrology contacted family and decision was made to continue iHD this week to allow additional time for renal recovery. Patient is not a long term iHD candidate  7/18 plans for iHD, tolerated with 2L removed  7/19 slight drop in hgb to 6.9 will transfuse 1 unit PRBC  7/20 To [facility], PCCM following for trach / vent needs 7/22>> tolerating HD, will need out of state placement 8/4 last PCCM visit. 8/4 -8/8: No issues. CM workin gon out of state placement  8/15 no acute changes. Continues on PRVC, was apneic with PSV attempt. Did wean to 40% FiO2  8/29 Oral bleeding from biting lip/tongue, subsequent bloody trach secretions  8/30 Left FA AVG occlueded 9/2 s/p thrombectomy and revision of LUE AVG 9/4 LUE with neg venous dupplex 9/5 heparin stopped, resumed Eliquis  9/6 iHD with 2L UF 9/7 changed from [facility] to PCV given high plat pressures 9/10 remains on the ventilator, unable to wean 9/19 unchanged 9/26 Stable on PCV, increased clear secretions per RN 10/3 stable on PCV  10/9 Overnight Surgery consulted for bowel ischemia. Deemed not a surgical candidate and recommended comfort care. Developed septic shock and currently on pressors  Interim History / Subjective:  Overnight Surgery consulted for bowel ischemia. Deemed not a surgical candidate and recommended comfort care. Developed septic shock and currently on pressors  Objective:  Blood pressure (!) 77/55, pulse (!) 101, temperature (!) 93.8 F (34.3 C), temperature source Axillary, resp. rate 20, height '5\' 6"'$  (1.676 m), weight 79.9 kg, SpO2 100 %.  Vent Mode: PRVC FiO2 (%):  [30 %-60 %] 60 % Set Rate:  [20 bmp] 20 bmp Vt Set:  [500 mL] 500 mL PEEP:  [5 cmH20] 5 cmH20 Plateau Pressure:  [24 cmH20-36 cmH20] 36 cmH20   Intake/Output Summary (Last 24 hours) at  2020-11-01 0859 Last data filed at Nov 01, 2020 0500 Gross per 24 hour  Intake 1135 ml  Output 15 ml  Net 1120 ml   Filed Weights   10/19/20 0500 10/20/20 0500 Nov 01, 2020 0500  Weight: 79.2 kg 79.7 kg 79.9 kg   Physical Exam: General: Critically ill-appearing, unresponsive, not tracking HENT: [place], AT, OP clear, MMM Neck: Trach in place, c/d/i Eyes: Right pupil 75m Left pupil 256m non/minimally reactive, no scleral icterus Respiratory: Vented breath sounds bilaterally.  No crackles, wheezing or rales Cardiovascular: RRR, -M/R/G, no JVD GI: Hypoactive BS, firm, distended Extremities: Anasarca Neuro: Unresponsive, no tracking  Resolved Hospital Problem List:  Hyperkalemia Tachycardia Hypotension Thrombocytosis  AKI Septic shock 2nd to HCAP and UTI all present on admission Acinetobacter in sputum culture from 07/12/20. Completed 14 days of Unasyn for -Of pressors on 4/14. Restarted  levo 7/17. Acute on chronic respiratory failure   Assessment & Plan:   Septic shock secondary to ischemic bowel --Not a surgical candidate --Vasopressors for MAP goal >65 --Add vasopressin to levophed --Continue antibiotics --GOC discussion when family arrive for nonsurvivable condition  Electrolyte abnormalities secondary to shock, ESRD intolerant to dialysis Hyperkalemia Metabolic acidosis ESRD --Sodium bicarbonate gtt --Further dialysis futile in setting of nonsurvivable condition  C3-C5 compressive myelopathy Chronic respiratory failure Trach / Vent dependence Acinetobacter Baumannii PNA Antibiotic course completed 8/22.  Probable component of pulm edema  P - Continue vent support (8-10 cc/kg IBW) - Continue PCV, tolerating with better Pplat - Wean FiO2 for O2 sat > 90% -Routine trach care -VAP, pulm hygiene -volume removal per HD  LUE edema -agree with nephro, if edema doesn't improve would eval. Difficult from my vantage point to know how this has progressed since CCM only is  following weekly for trach  GOC Partial code (drugs, vent only) in the event of decompensation Family arriving today. Patient clinically deteriorating despite aggressive measures. Expect hospital death in the next 24-48 hours. Recommend comfort care.   The patient is critically ill with multiple organ systems failure and requires high complexity decision making for assessment and support, frequent evaluation and titration of therapies, application of advanced monitoring technologies and extensive interpretation of multiple databases.  Independent Critical Care Time: 749inutes.   JaRodman PickleM.D. [facility] 10October 20, 2022:59 AM   Please see Amion for pager number to reach on-call Pulmonary and Critical Care Team.

## 2020-11-13 NOTE — Progress Notes (Signed)
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  Presented from [place] with AMS  hypotension and pneumonia resultant septic shock is admitted to the intensive care unit.  Has had multiple admissions in the past for Klebsiella, MRSA and Pseudomonas urinary tract infections and pneumonia.  His most recent admission 10/03/2020 with multiple drug-resistant Klebsiella and right ureteral calculus requiring double-J stent.  Renal replacement therapy was initiated 07/18/2020.       Assessment/ Plan:   1.Acute kidney Injury-prolonged dialysis dependency, now progressed to ESRD:  ATN from septic shock-without any evidence of renal recovery to date . initiated on CRRT 07/18/20 and transitioned to IHD.  Has TDC & AVG but TDC now out due to PsA bacteremia.  No HD yesterday as planned due to clinical status.    2 Vascular access - had left forearm AVG placed 08/21/2020 but unfortunately had clotted.  s/p thrombectomy and revision 9/2. Tolerated  new cannulation on 9/28, used 10/4 but not used 10/6 due to arm swelling.  IR was to do shuntogram Monday (needed to be off Eliquis prior) due to arm edema but this will be on hold given clinical decompensation.  RIJ TDC now out due to bacteremia so if the AVG is not useable we would need to proceed with a new line.   3 Septic shock:  h/o VAP due to recurrent Acinetobacter infection: now with worsening leukocytosis and low grade fever with +bacteremia pseudomonas +pneumatosis ; ID following.  Now on high dose vasopressor support with really no options for definitive management and expected he will not survive this insult.   4 Chronic respiratory failure: Status post tracheostomy with ventilator management per CCM.   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   6 Chronic kidney disease/metabolic bone  disease: Corr Calcium high - suspect immobility with low PTH - no ca supplements, low ca dialsate.  Phosphorus level under control with hemodialysis, off binders. PTH 26 on 10/1   7 Anemia: contiue ESA increased to 15mg starting 10/4, monitor Hb.  Transfuse as needed.    8 Acute metabolic encephalopathy: Continue management as above.,  Supportive care.  9Disposition - appears likely his pneumatosis and shock will be fatal given no options for management.    I don't have anything else to offer, please page me if I can be of assistance.    Subjective:   Worsening abd distention yesterday - CT with pneumatosis/ischemic bowel.  Clinical decompensation overnight now on high dose NE for hypotension.  No RRT possible due to clinical status.  Family aware and on way to hospital.  Expected he will pass today.    Objective:   BP (!) 62/25   Pulse 78   Temp (!) 94.3 F (34.6 C) (Axillary)   Resp 20   Ht _0  (1.676 m)   Wt 79.9 kg   SpO2 100%   BMI 28.43 kg/m   Intake/Output Summary (Last 24 hours) at [id number] data filed at [id number] Gross per 24 hour  Intake 1230.19 ml  Output 15 ml  Net 1215.19 ml    Weight change: 0.2 kg  Physical Exam: General:NAD, tracheostomy on vent. Heart: normal rate, s1s2 nl Lungs: trach, no increased work of breathing. Abdomen:distended Extremities: Trace lower extremities edema Neuro: somnolent Dialysis Access: RIJ TDC out. Lt FAL AVG decreased bruit and 2+ pitting edema noted of L arm.  Sutures intact at [facility] fossa incision.  No erythema or drainage noted at AVG.   Imaging: CT ABDOMEN PELVIS WO CONTRAST  Result Date: 10/20/2020 CLINICAL DATA:  Abdominal distension EXAM: CT ABDOMEN AND PELVIS WITHOUT CONTRAST TECHNIQUE: Multidetector CT imaging of the abdomen and pelvis was performed following the standard protocol without IV contrast. Sagittal and coronal MPR images reconstructed from axial data set. COMPARISON:  09/24/2020  FINDINGS: Lower chest: BILATERAL lower lobe volume loss and bronchiectasis. Underlying emphysematous changes. Hepatobiliary: Dependent calculi within gallbladder. Marked portal venous gas within liver. No focal hepatic mass. Pancreas: Atrophic, otherwise unremarkable. Spleen: Normal appearance Adrenals/Urinary Tract: Markedly atrophic RIGHT kidney with ureteral stent extending to bladder. Large cyst LEFT kidney 7.9 x 8.4 x 10.3 cm with smaller cyst at upper pole. Nonobstructing calculi RIGHT kidney. No hydronephrosis or hydroureter. 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 Hartmann pouch. Segmental wall thickening ascending colon. Remainder of colon unremarkable. Extensive gastric pneumatosis extending into distal esophagus. Additional pneumatosis throughout duodenum and into proximal jejunum associated extensive air within superior mesenteric vein branches and within perigastric veins. Findings are highly concerning for ischemia. Gastrostomy tube noted within stomach with gaseous distention of stomach and small fluid level. No definite colonic pneumatosis. Mild dilatation of small bowel loops diffusely favoring ileus. Vascular/Lymphatic: Extensive atherosclerotic calcifications aorta, iliac arteries, visceral arteries. Noncontrast exam, unable to assess vessel patency. No high attenuation thrombi grossly evident. Aorta normal caliber. No adenopathy. Reproductive: Unremarkable Other: No free air or free fluid. Parastomal herniation of fat. No acute osseous fall findings. Musculoskeletal: No acute osseous findings. IMPRESSION: Pneumatosis of gastric wall and duodenum extending into proximal jejunum and distal esophagus, associated with significant gas within perigastric and mesenteric veins and extensive portal venous gas; findings are concerning for ischemia. Extensive atherosclerotic calcifications but unable to assess for patency of mesenteric and visceral branch vessels due to lack of  IV contrast. Secondary ileus of bowel with gaseous distention of stomach. Cholelithiasis. BILATERAL lower lobe atelectasis and bronchiectasis. Parastomal herniation of fat. Atrophic RIGHT kidney with 10.3 cm LEFT renal cyst. Aortic Atherosclerosis (ICD10-I70.0) and Emphysema (ICD10-J43.9). Critical Value/emergent results were called by telephone at the time of interpretation on 10/20/2020 at 1757 hours to provider Niel Hummer MD, who verbally acknowledged these results. Electronically Signed   By: Lavonia Dana M.D.   On: 10/20/2020 18:04   DG Abd 1 View  Result Date: 10/20/2020 CLINICAL DATA:  Abdominal distension. EXAM: ABDOMEN - 1 VIEW COMPARISON:  October 17, 2020 FINDINGS: Limited view of the abdomen demonstrates diffuse nonspecific gaseous distension of small bowel loops of up to 3.2 cm. Right double-J ureteral stent. No evidence of free gas on this supine radiograph excluding the diaphragm. IMPRESSION: Limited view of the abdomen demonstrates diffuse nonspecific gaseous distension of small bowel loops of up to 3.2 cm. Electronically Signed   By: Fidela Salisbury M.D.   On: 10/20/2020 15:45   IR Fluoro Guide CV Line Left  Result Date: 10/19/2020 INDICATION: 70 year old gentleman with tunneled right IJ hemodialysis catheter, nonfunctioning dialysis AV fistula, and tunneled left IJ power line was found to have Pseudomonas bacteremia. IR consulted for removal of tunneled dialysis catheter for line holiday. Patient still requires central venous access therefore tunneled left IJ power line was exchanged. EXAM: REMOVAL TUNNELED CENTRAL VENOUS CATHETER EXCHANGE OF LEFT IJ POWER LINE MEDICATIONS: Patient receiving antibiotics per in-patient protocol ANESTHESIA/SEDATION: None FLUOROSCOPY TIME:  Fluoroscopy Time: 1.2 minutes (12.4 mGy). COMPLICATIONS: None immediate. PROCEDURE: Informed written consent was obtained from the patient/patient  representative after a thorough discussion of the procedural risks,  benefits and alternatives. All questions were addressed. Maximal Sterile Barrier Technique was utilized including caps, mask, sterile gowns, sterile gloves, sterile drape, hand hygiene and skin antiseptic. A timeout was performed prior to the initiation of the procedure. Both tunneled hemodialysis catheter and power line were prepped and draped in usual fashion. The left IJ power line was removed over a 0.018 inch guidewire. New dual lumen power line was cut to the same length and inserted over the guidewire with tip positioned at the cavoatrial junction. Both lumens aspirated and flushed well. The catheter was secured to skin with suture and the insert site was covered with bio patch and sterile dressing. Tunneled right IJ hemodialysis catheter removed without difficulty. Insertion site covered with sterile dressing. IMPRESSION: 1. Successful removal of tunneled right IJ hemodialysis catheter. 2. Successful exchange of tunneled left IJ power line. Exchange was performed with the patient to maintain central venous access. 3. Fistulagram should not be performed until patient has cleared his bacteremia. Electronically Signed   By: Miachel Roux M.D.   On: 10/19/2020 15:30   IR Removal Tun Cv Cath W/O FL  Result Date: 10/19/2020 INDICATION: 70 year old gentleman with tunneled right IJ hemodialysis catheter, nonfunctioning dialysis AV fistula, and tunneled left IJ power line was found to have Pseudomonas bacteremia. IR consulted for removal of tunneled dialysis catheter for line holiday. Patient still requires central venous access therefore tunneled left IJ power line was exchanged. EXAM: REMOVAL TUNNELED CENTRAL VENOUS CATHETER EXCHANGE OF LEFT IJ POWER LINE MEDICATIONS: Patient receiving antibiotics per in-patient protocol ANESTHESIA/SEDATION: None FLUOROSCOPY TIME:  Fluoroscopy Time: 1.2 minutes (12.4 mGy). COMPLICATIONS: None immediate. PROCEDURE: Informed written consent was obtained from the patient/patient  representative after a thorough discussion of the procedural risks, benefits and alternatives. All questions were addressed. Maximal Sterile Barrier Technique was utilized including caps, mask, sterile gowns, sterile gloves, sterile drape, hand hygiene and skin antiseptic. A timeout was performed prior to the initiation of the procedure. Both tunneled hemodialysis catheter and power line were prepped and draped in usual fashion. The left IJ power line was removed over a 0.018 inch guidewire. New dual lumen power line was cut to the same length and inserted over the guidewire with tip positioned at the cavoatrial junction. Both lumens aspirated and flushed well. The catheter was secured to skin with suture and the insert site was covered with bio patch and sterile dressing. Tunneled right IJ hemodialysis catheter removed without difficulty. Insertion site covered with sterile dressing. IMPRESSION: 1. Successful removal of tunneled right IJ hemodialysis catheter. 2. Successful exchange of tunneled left IJ power line. Exchange was performed with the patient to maintain central venous access. 3. Fistulagram should not be performed until patient has cleared his bacteremia. Electronically Signed   By: Miachel Roux M.D.   On: 10/19/2020 15:30    Labs: BMET Recent Labs  Lab 10/15/20 0500 10/18/20 0638 10/19/20 0850 10/20/20 0500 2020/11/03 0149 03-Nov-2020 0150 Nov 03, 2020 0636  NA 131* 131* 130* 130* 133* 139 140  K 4.9 3.7 3.7 3.8 7.3* 6.4* 5.9*  CL 91* 92* 91* 90*  --  90*  --   CO2 _0 18*  --  22  --   GLUCOSE 136* 175* 221* 97  --  140*  --   BUN 108* 94* 58* 88*  --  97*  --   CREATININE 1.62* 1.64* 1.12 1.39*  --  1.78*  --   CALCIUM 9.6  9.2 9.2 9.5  --  9.0  --   PHOS 6.0* 5.3*  --   --   --   --   --     CBC Recent Labs  Lab 10/18/20 0357 10/19/20 0850 10/20/20 0500 10/20/20 1920 11/05/2020 0149 November 05, 2020 0636  WBC 43.3* 32.2* 46.3*  --   --   --   NEUTROABS 38.7*  --  40.7*  --   --    --   HGB 8.1* 9.6* 9.1* 10.2* 10.9* 11.2*  HCT 25.6* 30.4* 28.3* 33.5* 32.0* 33.0*  MCV 75.5* 76.0* 74.3*  --   --   --   PLT 524* 596* 718*  718*  --   --   --      Medications:     amiodarone  200 mg Per Tube Daily   atropine  0.5 mg Intravenous Once   buPROPion  75 mg Per Tube BID   chlorhexidine gluconate (MEDLINE KIT)  15 mL Mouth Rinse BID   Chlorhexidine Gluconate Cloth  6 each Topical Q0600   darbepoetin (ARANESP) injection - DIALYSIS  150 mcg Intravenous Q Tue-HD   guaiFENesin  5 mL Per Tube BID   insulin aspart  0-9 Units Subcutaneous Q4H   insulin glargine-yfgn  30 Units Subcutaneous Daily   lidocaine  1 patch Transdermal Q24H   mouth rinse  15 mL Mouth Rinse 10 times per day   metaxalone  800 mg Per Tube TID   midodrine  5 mg Per Tube TID WC   multivitamin  1 tablet Per Tube QHS   oxyCODONE  5 mg Per Tube Q4H   pantoprazole (PROTONIX) IV  40 mg Intravenous Q12H   sertraline  100 mg Per Tube Daily   sodium chloride flush  10-40 mL Intracatheter Q12H      Justin Mend  05-Nov-2020, 7:12 AM

## 2020-11-13 NOTE — Progress Notes (Signed)
Unable to obtain ABG, will collect VBG with labs.

## 2020-11-13 NOTE — Progress Notes (Signed)
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  Patient presented secondary to altered mental status and hypotension with recurrent pneumonia requiring vasopressor support and ICU admission.  He has had multiple recent admission for sepsis secondary to various infectious including UTI, pneumonia and bacteremia in the setting of MDR Klebsiella, MRSA and Pseudomonas.  Most recent admission September 2021 in setting of sepsis due to MDR Klebsiella with right ureteral calculus status post double JJ ureteral stent placement and treated with meropenem for 10 days.  He was discharged back to [facility].   He was admitted 6/29 to the ICU with septic shock on Levophed.  Trach asp gross MDR Acinetobacter treated with meropenem.  Patient progress  to renal failure requiring renal replacement therapy.  Patient with prolonged hospital course.   Over last 24 to 48 hours patient developed Pseudomonas bacteremia, probably stroke and foregut ischemia.  Patient is not a surgical candidate.  Patient has been on maximal dose of Levophed,  he was a started on vasopressin. His hyperkalemia has been treated with bicarb, calcium gluconate, insulin.  Patient current condition is nonsurvivable.  Myself and Dr. Loanne Drilling with CCM, reviewed clinical course and current condition with patient's family.  Plan is to wean off of life support once the rest of the family arrive.    Assessment & Plan:   Principal Problem:   Acute respiratory failure with hypoxia (HCC) Active Problems:   Ventilator dependent (HCC)   Sacral decubitus ulcer   Septic shock (HCC)   Pressure injury of skin   AKI (acute kidney injury) ([place])   Hypotension   Goals of care, counseling/discussion   Quadriplegia (HCC)   Chronic kidney disease  requiring chronic dialysis ([place])   Hyperglycemia   Sepsis due to Acinetobacter baumannii ([place])   Pneumonia due to Acinetobacter species ([place])   Dysautonomia (HCC)   Chronic midline thoracic back pain   Abdominal distention   Leukocytosis   Bacteremia due to Pseudomonas  Extensive Pneumatosis, proximal foregut and extensive portal venous gas;  -Evaluated by surgery, no surgical  candidate  due to his overall medical condition.  -Presume related to embolic event.  Unable to use anticoagulation due to concern for GI bleed as well.  Further neurological deficit:  Develop worsening AMS, upward right gaze;  -likely related to stroke. No candidate for aggressive intervention.  -suspect embolic event. No candidate for anticoagulation due to concern for GI bleed earlier.   Sepsis /Pseudomonas Bacteremia;  WBC increase to 40. Blood culture growing pseudomonas.  On IV zosyn.  ID following.  Tunneled HD catheter removed 10/07, and central line tunneled was exchange for new line.  Was treated with IV antibiotics.  Maroon-colored stool;  Occult Blood positive.  On IV protonix to IV.  Repeat labs tonight. Transfusion as needed.  Continue to hold Eliquis.  Discussed with GI, plan to monitor Hb. Patient with recent sepsis , bacteremia is not stable for endoscopy procedure.   Acute on chronic respiratory failure with hypoxia and hypercapnia: Chronic Vent Dependent.  Treated for PNA.Marland Kitchen      Recurrent ventilator associated pneumonia secondary to recurrent see no bacteremia/sepsis without shock -Completed antibiotics treatment.    AKI with oliguria/volume overload/new CKD requiring hemodialysis Continue with HD.  -Nephrology following.  -unable to proceed with HD due to current medical condition.  Septic shock secondary to VAP POA -Resolved.    Chronic thoracic back pain: -Continue with pain management.    History of paroxysmal A. fib: On amiodarone.  Eliquis on hold due to concern  for GI bleed.    Acute metabolic encephalopathy Secondary to acute illness.  Alert, Non verbal.     Diabetes type 2: Continue with Semglee and SSI.    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    Shock  liver: Resolved.    Quadriplegia  secondary to compressive myelopathy -support care.  -poor prognosis.    Macrocytic anemia: -S/p total 6 units of packed red blood cell during this hospitalization.   Severe protein caloric malnutrition: Continue with tube feeding PEG tube was replaced    Nodular irregularity ascending Colon.  GI was consulted. Patient is to high risk for colonoscopy.     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  Right/posterior/lateral ankle unknown if present on admission.  DTI left ear, not present on admission. (For additional documentation see wound care notes)     Pressure Injury 06/27/2020 Sacrum Posterior;Medial Stage 4 - Full thickness tissue loss with exposed bone, tendon or muscle. (Active)  06/21/2020 2240  Location: Sacrum  Location Orientation: Posterior;Medial  Staging: Stage 4 - Full thickness tissue loss with exposed bone, tendon or muscle.  Wound Description (Comments):   Present on Admission: Yes     Pressure Injury 07/23/20 Ear Left Deep Tissue Pressure Injury - Purple or maroon localized area of discolored intact skin or blood-filled blister due to damage of underlying soft tissue from pressure and/or shear. DTI left ear (Active)  07/23/20 2000  Location: Ear  Location Orientation: Left  Staging: Deep Tissue Pressure Injury - Purple or maroon localized area of discolored intact skin or blood-filled blister due to damage of underlying soft tissue from pressure and/or shear.  Wound Description (Comments): DTI left ear  Present on Admission: No     Pressure Injury 09/03/20 Heel Right Stage 2 -  Partial thickness loss of dermis presenting as a shallow open  injury with a red, pink wound bed without slough. (Active)  09/03/20 0800  Location: Heel  Location Orientation: Right  Staging: Stage 2 -  Partial thickness loss of dermis presenting as a shallow open injury with a red, pink wound bed without slough.  Wound Description (Comments):   Present on Admission:      Nutrition Problem: Increased nutrient needs Etiology: wound healing    Signs/Symptoms: estimated needs    Interventions: Tube feeding, Prostat, MVI  Estimated body mass index is 28.43 kg/m as calculated from the following:   Height as of this encounter: _0  (1.676 m).   Weight as of this encounter: 79.9 kg.   DVT prophylaxis: SCD      Subjective: Non verbal. lethargic  Objective: Vitals:   October 24, 2020 0430 Oct 24, 2020 0450 10-24-2020 0500 2020-10-24 0700  BP: (!) 55/39 (!) 63/46 (!) 62/25 (!) 102/93  Pulse:  78  97  Resp: _1 Temp:      TempSrc:      SpO2:  100%  100%  Weight:   79.9 kg   Height:        Intake/Output Summary (Last 24 hours) at October 24, 2020 0739 Last data filed at 10/24/20 0500 Gross per 24 hour  Intake 1230.19 ml  Output 15 ml  Net 1215.19 ml    Filed Weights   10/19/20 0500 10/20/20 0500 24-Oct-2020 0500  Weight: 79.2 kg 79.7 kg 79.9 kg    Examination:  General exam: Chronic ill appearing.  Respiratory system: On Vent, BL air movement.  Cardiovascular system:  S 1, S 2  Gastrointestinal system: Distend, rigid, peg tube in place Central nervous system:unresponsive, not tracking.    Data Reviewed: I have personally reviewed following labs and imaging studies  CBC: Recent Labs  Lab 10/18/20 0357 10/19/20 0850 10/20/20 0500 10/20/20 1920 2020-10-22 0149 October 22, 2020 0636  WBC 43.3* 32.2* 46.3*  --   --   --   NEUTROABS 38.7*  --  40.7*  --   --   --   HGB 8.1* 9.6* 9.1* 10.2* 10.9* 11.2*  HCT 25.6* 30.4* 28.3* 33.5* 32.0* 33.0*  MCV 75.5* 76.0* 74.3*  --   --   --   PLT 524* 596* 718*  718*  --   --   --     Basic  Metabolic Panel: Recent Labs  Lab 10/15/20 0500 10/18/20 0638 10/19/20 0850 10/20/20 0500 2020/10/22 0149 Oct 22, 2020 0150 10/22/2020 0636  NA 131* 131* 130* 130* 133* 139 140  K 4.9 3.7 3.7 3.8 7.3* 6.4* 5.9*  CL 91* 92* 91* 90*  --  90*  --   CO2 _0 18*  --  22  --   GLUCOSE 136* 175* 221* 97  --  140*  --   BUN 108* 94* 58* 88*  --  97*  --   CREATININE 1.62* 1.64* 1.12 1.39*  --  1.78*  --   CALCIUM 9.6 9.2 9.2 9.5  --  9.0  --   PHOS 6.0* 5.3*  --   --   --   --   --     GFR: Estimated Creatinine Clearance: 38.3 mL/min (A) (by C-G formula based on SCr of 1.78 mg/dL (H)). Liver Function Tests: Recent Labs  Lab 10/15/20 0500 10/18/20 0638 10/20/20 0500  AST  --  41 35  ALT  --  22 28  ALKPHOS  --  243* 250*  BILITOT  --  1.1 1.5*  PROT  --  7.5 8.8*  ALBUMIN 1.7* 1.8* 2.0*    No results for input(s): LIPASE, AMYLASE in the last 168 hours. No results for input(s): AMMONIA in the last 168 hours. Coagulation Profile: Recent Labs  Lab 10/20/20 0500  INR 1.4*    Cardiac Enzymes: No results for input(s): CKTOTAL, CKMB, CKMBINDEX, TROPONINI in the last 168 hours. BNP (last 3 results) No results for input(s): PROBNP in the last 8760 hours. HbA1C: No results for input(s): HGBA1C in the last 72 hours. CBG: Recent Labs  Lab 10/20/20 1548 10/20/20 1758 10/20/20 1947 10/20/20 2325 10/22/2020 0328  GLUCAP 140* 148* 157* 130* 149*    Lipid Profile: No results for input(s): CHOL, HDL, LDLCALC, TRIG, CHOLHDL, LDLDIRECT in the last 72 hours. Thyroid Function Tests: No results for input(s): TSH, T4TOTAL, FREET4, T3FREE, THYROIDAB in the last 72 hours. Anemia Panel: No results for input(s): VITAMINB12, FOLATE, FERRITIN, TIBC, IRON, RETICCTPCT in the last 72 hours. Sepsis Labs: Recent Labs  Lab 10/20/20 2107 10/22/2020 0200  LATICACIDVEN 3.7* >9.0*    Recent Results (from the past 240 hour(s))  Culture, blood (routine x 2)     Status: Abnormal (Preliminary  result)   Collection Time: 10/18/20  6:38 AM   Specimen: BLOOD  Result Value Ref Range Status   Specimen Description BLOOD RIGHT ANTECUBITAL  Final   Special Requests IN PEDIATRIC BOTTLE Blood Culture adequate volume  Final   Culture  Setup Time   Final    GRAM NEGATIVE RODS AEROBIC BOTTLE ONLY CRITICAL VALUE NOTED.  VALUE IS CONSISTENT WITH PREVIOUSLY REPORTED AND CALLED VALUE.    Culture (A)  Final    PSEUDOMONAS AERUGINOSA CULTURE REINCUBATED FOR BETTER GROWTH Performed at [facility], [place] [address] [place], [place] [address]    Report Status PENDING  Incomplete  Culture, blood (routine x 2)     Status: Abnormal (Preliminary result)   Collection Time: 10/18/20  6:40 AM   Specimen: BLOOD RIGHT FOREARM  Result Value Ref Range Status   Specimen Description BLOOD RIGHT FOREARM  Final   Special Requests IN PEDIATRIC BOTTLE Blood Culture adequate volume  Final   Culture  Setup Time   Final    GRAM NEGATIVE RODS AEROBIC BOTTLE ONLY CRITICAL RESULT CALLED TO, READ BACK BY AND VERIFIED WITH: PHARMD GREG ABBOTT 10/19/20_0 :20 BY TW    Culture (A)  Final    PSEUDOMONAS AERUGINOSA CULTURE REINCUBATED FOR BETTER GROWTH SUSCEPTIBILITIES TO FOLLOW Performed at [facility], [place] [address], [place], [place] [address]    Report Status PENDING  Incomplete  Blood Culture ID Panel (Reflexed)     Status: Abnormal   Collection Time: 10/18/20  6:40 AM  Result Value Ref Range Status   Enterococcus faecalis NOT DETECTED NOT DETECTED Final   Enterococcus Faecium NOT DETECTED NOT DETECTED Final   Listeria monocytogenes NOT DETECTED NOT DETECTED Final   Staphylococcus species NOT DETECTED NOT DETECTED Final   Staphylococcus aureus (BCID) NOT DETECTED NOT DETECTED Final   Staphylococcus epidermidis NOT DETECTED NOT DETECTED Final   Staphylococcus lugdunensis NOT DETECTED NOT DETECTED Final   Streptococcus species NOT DETECTED NOT DETECTED Final   Streptococcus agalactiae NOT  DETECTED NOT DETECTED Final   Streptococcus pneumoniae NOT DETECTED NOT DETECTED Final   Streptococcus pyogenes NOT DETECTED NOT DETECTED Final   A.calcoaceticus-baumannii NOT DETECTED NOT DETECTED Final   Bacteroides fragilis NOT DETECTED NOT DETECTED Final   Enterobacterales NOT DETECTED NOT DETECTED Final   Enterobacter cloacae complex NOT DETECTED NOT DETECTED Final   Escherichia coli NOT DETECTED NOT DETECTED Final   Klebsiella aerogenes NOT DETECTED NOT DETECTED Final   Klebsiella oxytoca NOT DETECTED NOT DETECTED Final   Klebsiella pneumoniae NOT DETECTED NOT DETECTED Final   Proteus species NOT DETECTED NOT DETECTED Final   Salmonella species NOT DETECTED NOT DETECTED Final   Serratia marcescens NOT DETECTED NOT DETECTED Final   Haemophilus influenzae NOT DETECTED NOT DETECTED Final   Neisseria meningitidis NOT DETECTED NOT DETECTED Final   Pseudomonas aeruginosa DETECTED (A) NOT DETECTED Final    Comment: CRITICAL RESULT CALLED TO, READ BACK BY AND VERIFIED WITH: PHARMD GREG ABBOTT 10/19/20_1 :20 BY TW    Stenotrophomonas maltophilia NOT DETECTED NOT DETECTED Final   Candida albicans NOT DETECTED NOT DETECTED Final   Candida auris NOT DETECTED NOT DETECTED Final   Candida glabrata NOT DETECTED NOT DETECTED Final   Candida krusei NOT DETECTED NOT DETECTED Final   Candida parapsilosis NOT DETECTED NOT DETECTED Final   Candida tropicalis NOT DETECTED NOT DETECTED Final   Cryptococcus neoformans/gattii NOT DETECTED NOT DETECTED Final   CTX-M ESBL NOT DETECTED NOT DETECTED Final   Carbapenem resistance IMP NOT DETECTED NOT DETECTED Final   Carbapenem resistance KPC NOT DETECTED NOT DETECTED Final   Carbapenem resistance NDM NOT DETECTED NOT DETECTED Final   Carbapenem resistance VIM NOT DETECTED NOT DETECTED Final    Comment: Performed  at [facility], [place] [address], [place], [place] [address]  Culture, Respiratory w Gram Stain     Status: None   Collection Time: 10/18/20   8:59 AM   Specimen: Tracheal Aspirate; Respiratory  Result Value Ref Range Status   Specimen Description TRACHEAL ASPIRATE  Final   Special Requests NONE  Final   Gram Stain   Final    RARE WBC PRESENT,BOTH PMN AND MONONUCLEAR RARE GRAM POSITIVE RODS    Culture   Final    FEW Normal respiratory flora-no Staph aureus or Pseudomonas seen Performed at [facility], [address], [place], [place] [address]    Report Status 10/20/2020 FINAL  Final          Radiology Studies: CT ABDOMEN PELVIS WO CONTRAST  Result Date: 10/20/2020 CLINICAL DATA:  Abdominal distension EXAM: CT ABDOMEN AND PELVIS WITHOUT CONTRAST TECHNIQUE: Multidetector CT imaging of the abdomen and pelvis was performed following the standard protocol without IV contrast. Sagittal and coronal MPR images reconstructed from axial data set. COMPARISON:  09/24/2020 FINDINGS: Lower chest: BILATERAL lower lobe volume loss and bronchiectasis. Underlying emphysematous changes. Hepatobiliary: Dependent calculi within gallbladder. Marked portal venous gas within liver. No focal hepatic mass. Pancreas: Atrophic, otherwise unremarkable. Spleen: Normal appearance Adrenals/Urinary Tract: Markedly atrophic RIGHT kidney with ureteral stent extending to bladder. Large cyst LEFT kidney 7.9 x 8.4 x 10.3 cm with smaller cyst at upper pole. Nonobstructing calculi RIGHT kidney. No hydronephrosis or hydroureter. 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 Hartmann pouch. Segmental wall thickening ascending colon. Remainder of colon unremarkable. Extensive gastric pneumatosis extending into distal esophagus. Additional pneumatosis throughout duodenum and into proximal jejunum associated extensive air within superior mesenteric vein branches and within perigastric veins. Findings are highly concerning for ischemia. Gastrostomy tube noted within stomach with gaseous distention of stomach and small fluid level. No definite  colonic pneumatosis. Mild dilatation of small bowel loops diffusely favoring ileus. Vascular/Lymphatic: Extensive atherosclerotic calcifications aorta, iliac arteries, visceral arteries. Noncontrast exam, unable to assess vessel patency. No high attenuation thrombi grossly evident. Aorta normal caliber. No adenopathy. Reproductive: Unremarkable Other: No free air or free fluid. Parastomal herniation of fat. No acute osseous fall findings. Musculoskeletal: No acute osseous findings. IMPRESSION: Pneumatosis of gastric wall and duodenum extending into proximal jejunum and distal esophagus, associated with significant gas within perigastric and mesenteric veins and extensive portal venous gas; findings are concerning for ischemia. Extensive atherosclerotic calcifications but unable to assess for patency of mesenteric and visceral branch vessels due to lack of IV contrast. Secondary ileus of bowel with gaseous distention of stomach. Cholelithiasis. BILATERAL lower lobe atelectasis and bronchiectasis. Parastomal herniation of fat. Atrophic RIGHT kidney with 10.3 cm LEFT renal cyst. Aortic Atherosclerosis (ICD10-I70.0) and Emphysema (ICD10-J43.9). Critical Value/emergent results were called by telephone at the time of interpretation on 10/20/2020 at 1757 hours to provider Niel Hummer MD, who verbally acknowledged these results. Electronically Signed   By: Lavonia Dana M.D.   On: 10/20/2020 18:04   DG Abd 1 View  Result Date: 10/20/2020 CLINICAL DATA:  Abdominal distension. EXAM: ABDOMEN - 1 VIEW COMPARISON:  October 17, 2020 FINDINGS: Limited view of the abdomen demonstrates diffuse nonspecific gaseous distension of small bowel loops of up to 3.2 cm. Right double-J ureteral stent. No evidence of free gas on this supine radiograph excluding the diaphragm. IMPRESSION: Limited view of the abdomen demonstrates diffuse nonspecific gaseous distension of small bowel loops of up to 3.2 cm. Electronically Signed  By: Fidela Salisbury M.D.   On: 10/20/2020 15:45   IR Fluoro Guide CV Line Left  Result Date: 10/19/2020 INDICATION: 70 year old gentleman with tunneled right IJ hemodialysis catheter, nonfunctioning dialysis AV fistula, and tunneled left IJ power line was found to have Pseudomonas bacteremia. IR consulted for removal of tunneled dialysis catheter for line holiday. Patient still requires central venous access therefore tunneled left IJ power line was exchanged. EXAM: REMOVAL TUNNELED CENTRAL VENOUS CATHETER EXCHANGE OF LEFT IJ POWER LINE MEDICATIONS: Patient receiving antibiotics per in-patient protocol ANESTHESIA/SEDATION: None FLUOROSCOPY TIME:  Fluoroscopy Time: 1.2 minutes (12.4 mGy). COMPLICATIONS: None immediate. PROCEDURE: Informed written consent was obtained from the patient/patient representative after a thorough discussion of the procedural risks, benefits and alternatives. All questions were addressed. Maximal Sterile Barrier Technique was utilized including caps, mask, sterile gowns, sterile gloves, sterile drape, hand hygiene and skin antiseptic. A timeout was performed prior to the initiation of the procedure. Both tunneled hemodialysis catheter and power line were prepped and draped in usual fashion. The left IJ power line was removed over a 0.018 inch guidewire. New dual lumen power line was cut to the same length and inserted over the guidewire with tip positioned at the cavoatrial junction. Both lumens aspirated and flushed well. The catheter was secured to skin with suture and the insert site was covered with bio patch and sterile dressing. Tunneled right IJ hemodialysis catheter removed without difficulty. Insertion site covered with sterile dressing. IMPRESSION: 1. Successful removal of tunneled right IJ hemodialysis catheter. 2. Successful exchange of tunneled left IJ power line. Exchange was performed with the patient to maintain central venous access. 3. Fistulagram should not be performed until  patient has cleared his bacteremia. Electronically Signed   By: Miachel Roux M.D.   On: 10/19/2020 15:30   IR Removal Tun Cv Cath W/O FL  Result Date: 10/19/2020 INDICATION: 70 year old gentleman with tunneled right IJ hemodialysis catheter, nonfunctioning dialysis AV fistula, and tunneled left IJ power line was found to have Pseudomonas bacteremia. IR consulted for removal of tunneled dialysis catheter for line holiday. Patient still requires central venous access therefore tunneled left IJ power line was exchanged. EXAM: REMOVAL TUNNELED CENTRAL VENOUS CATHETER EXCHANGE OF LEFT IJ POWER LINE MEDICATIONS: Patient receiving antibiotics per in-patient protocol ANESTHESIA/SEDATION: None FLUOROSCOPY TIME:  Fluoroscopy Time: 1.2 minutes (12.4 mGy). COMPLICATIONS: None immediate. PROCEDURE: Informed written consent was obtained from the patient/patient representative after a thorough discussion of the procedural risks, benefits and alternatives. All questions were addressed. Maximal Sterile Barrier Technique was utilized including caps, mask, sterile gowns, sterile gloves, sterile drape, hand hygiene and skin antiseptic. A timeout was performed prior to the initiation of the procedure. Both tunneled hemodialysis catheter and power line were prepped and draped in usual fashion. The left IJ power line was removed over a 0.018 inch guidewire. New dual lumen power line was cut to the same length and inserted over the guidewire with tip positioned at the cavoatrial junction. Both lumens aspirated and flushed well. The catheter was secured to skin with suture and the insert site was covered with bio patch and sterile dressing. Tunneled right IJ hemodialysis catheter removed without difficulty. Insertion site covered with sterile dressing. IMPRESSION: 1. Successful removal of tunneled right IJ hemodialysis catheter. 2. Successful exchange of tunneled left IJ power line. Exchange was performed with the patient to maintain  central venous access. 3. Fistulagram should not be performed until patient has cleared his bacteremia. Electronically Signed   By: Sharen Heck  Mir  M.D.   On: 10/19/2020 15:30        Scheduled Meds:  amiodarone  200 mg Per Tube Daily   atropine  0.5 mg Intravenous Once   buPROPion  75 mg Per Tube BID   chlorhexidine gluconate (MEDLINE KIT)  15 mL Mouth Rinse BID   Chlorhexidine Gluconate Cloth  6 each Topical Q0600   darbepoetin (ARANESP) injection - DIALYSIS  150 mcg Intravenous Q Tue-HD   guaiFENesin  5 mL Per Tube BID   insulin aspart  0-9 Units Subcutaneous Q4H   insulin glargine-yfgn  30 Units Subcutaneous Daily   lidocaine  1 patch Transdermal Q24H   mouth rinse  15 mL Mouth Rinse 10 times per day   metaxalone  800 mg Per Tube TID   midodrine  5 mg Per Tube TID WC   multivitamin  1 tablet Per Tube QHS   oxyCODONE  5 mg Per Tube Q4H   pantoprazole (PROTONIX) IV  40 mg Intravenous Q12H   sertraline  100 mg Per Tube Daily   sodium chloride flush  10-40 mL Intracatheter Q12H   Continuous Infusions:  sodium chloride 250 mL (10/19/20 1657)   norepinephrine (LEVOPHED) Adult infusion 10 mcg/min (October 24, 2020 0543)   piperacillin-tazobactam (ZOSYN)  IV 2.25 g (2020/10/24 0206)    sodium bicarbonate (isotonic) infusion in sterile water 100 mL/hr at 10/24/20 0224     LOS: 102 days    Time spent: 35 minutes    Temekia Caskey A Jalilah Wiltsie, MD [facility]   If 7PM-7AM, please contact night-coverage [email or web address]  2020-10-24, 7:39 AM

## 2020-11-13 NOTE — Progress Notes (Addendum)
[facility] Progress Note Patient Name: BARNIE SHALHOUB DOB: [birth year] MRN: [id number]   Date of Service  08-Nov-2020  HPI/Events of Note  Hyperkalemia - K+ = 6.4.  eICU Interventions  Plan: NaHCO3 100 meq IV now. Calcium gluconate 1 gm IV now. D50 1 amp IV now. Novolog insulin 10 units IV now.  Repeat BMP at 7 AM.     Intervention Category Major Interventions: Electrolyte abnormality - evaluation and management  Daleyza Gadomski [place] 11/08/2020, 3:09 AM

## 2020-11-13 NOTE — Death Summary Note (Signed)
Death Summary  Luis Orr [id number] DOB: 1950-09-25 DOA: 07-27-2020  PCP: Townsend Roger, MD   Admit date: 2020/07/27 Date of Death: 11-06-20  Final Diagnoses:  Principal Problem:   Acute respiratory failure with hypoxia ([place])   Extensive Pneumatosis proximal foregut. Ischemia.  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  Patient presented secondary to altered mental status and hypotension with recurrent pneumonia requiring vasopressor support and ICU admission.  He has had multiple recent admission for sepsis secondary to various infectious including UTI, pneumonia and bacteremia in the setting of MDR Klebsiella, MRSA and Pseudomonas.  Most recent admission September 2021 in setting of sepsis due to MDR Klebsiella with right ureteral calculus status post double JJ ureteral stent placement and treated with meropenem for 10 days.  He was discharged back to [facility].   He was admitted 2022-07-28 to the ICU with septic shock on Levophed.  Trach asp gross MDR Acinetobacter treated with meropenem.  Patient progress  to renal failure requiring renal replacement therapy.  Patient with prolonged hospital course.    Over last 24 to 48 hours patient developed Pseudomonas bacteremia, probably  stroke and foregut ischemia.  Patient is not a surgical candidate.  Patient has been on maximal dose of Levophed,  he was a started on vasopressin. His hyperkalemia has been treated with bicarb, calcium gluconate, insulin.  Patient current condition is nonsurvivable.  Myself and Dr. Loanne Drilling with CCM, reviewed clinical course and current condition with patient's family.  Plan is to wean off of life support once the rest of the family arrive.       Hospital Course:  Extensive Pneumatosis, proximal foregut and extensive portal venous gas; Sepsis, shock. Related to ischemic bowel.  -Evaluated by surgery, no surgical  candidate  due to his overall medical condition.  -Presume related to embolic event.  Unable to use anticoagulation due to concern for GI bleed as well. Current medical condition is futile.  He received maximal dose IV pressor, IV bicarb gtt. His hyperkalemia was treated medical.  Clinical course dicussed with family, patient was transition to comfort care. Support was provided to family.    Further neurological deficit:  Develop worsening AMS, upward right gaze; facial drop/  -Suspect stroke. No candidate for aggressive intervention. Unstable for scans.  -Suspect embolic event. No candidate for anticoagulation due to concern for GI bleed earlier.    Sepsis /Pseudomonas Bacteremia;  WBC increase to 40. Blood culture growing pseudomonas 10/06. On IV zosyn.  ID following.  Tunneled HD catheter removed 10/07, and central line tunneled was exchange for new line.  He was started on  IV antibiotics.   Maroon-colored stool;  Occult Blood positive.  He was started on IV protonix to IV.  Transfusion as needed.  Continue to hold Eliquis.  Discussed  with GI, plan to monitor Hb. Patient with recent sepsis , bacteremia is not stable for endoscopy procedure.    Acute on chronic respiratory failure with hypoxia and hypercapnia: Chronic Vent Dependent.  Treated for PNA.Marland Kitchen      Recurrent  ventilator associated pneumonia secondary to recurrent see no bacteremia/sepsis without shock -Completed antibiotics treatment. He has had recurrent sepsis and treated with OV antibiotics multiples time during this prolong hospitalization course.    AKI with oliguria/volume overload/new CKD requiring hemodialysis Continue with HD.  -Nephrology following.  -unable to proceed with HD due to current medical condition.    Septic shock secondary to VAP POA -Resolved.    Chronic thoracic back pain: -Continue with pain management.    History of paroxysmal A. fib: On amiodarone.  Eliquis was on hold due to concern for GI bleed.    Acute metabolic encephalopathy Secondary to acute illness.  Alert, Non verbal , quadriplegic. .      Diabetes type 2: Continue with Semglee and SSI.    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    Shock  liver: Resolved.    Quadriplegia  secondary to compressive myelopathy -support care.  -poor prognosis.    Macrocytic anemia: -S/p total 6 units of packed red blood cell during this hospitalization.    Severe protein caloric malnutrition: He was on  tube feeding PEG tube was replaced    Nodular irregularity ascending Colon.  GI was consulted. Patient is to high risk for colonoscopy.      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  Right/posterior/lateral ankle unknown if present on admission.  DTI left ear, not present on admission. (For additional documentation see wound care notes)     Pressure Injury 06/21/2020 Sacrum Posterior;Medial Stage 4 - Full thickness tissue loss with exposed bone, tendon or muscle. (Active)  07/04/2020 2240  Location: Sacrum  Location Orientation: Posterior;Medial  Staging: Stage 4 - Full thickness tissue loss with exposed bone, tendon or muscle.  Wound Description (Comments):   Present on Admission: Yes     Pressure Injury 07/23/20 Ear  Left Deep Tissue Pressure Injury - Purple or maroon localized area of discolored intact skin or blood-filled blister due to damage of underlying soft tissue from pressure and/or shear. DTI left ear (Active)  07/23/20 2000  Location: Ear  Location Orientation: Left  Staging: Deep Tissue Pressure Injury - Purple or maroon localized area of discolored intact skin or blood-filled blister due to damage of underlying soft tissue from pressure and/or shear.  Wound Description (Comments): DTI left ear  Present on Admission: No     Pressure Injury 09/03/20 Heel Right Stage 2 -  Partial thickness loss of dermis presenting as a shallow open injury with a red, pink wound bed without slough. (Active)  09/03/20 0800  Location: Heel  Location Orientation: Right  Staging: Stage 2 -  Partial thickness loss of dermis presenting as a shallow open injury with a red, pink wound bed without slough.  Wound Description (Comments):   Present on Admission:         Nutrition Problem: Increased nutrient needs Etiology: wound healing. He was on tube feeding.       Time: [id number]  Signed:  [place]  [facility] 10/22/2020, 6:57 PM

## 2020-11-13 NOTE — Progress Notes (Addendum)
Notified by RN that pt with low BP. Pt also developed low HR in 40s.  Given atropine 0.5 mg IV twice and HR improved.  Was given 500 ml bolus LR earlier. E-link seeing pt.  Bicarb given.  VBG shows pH of 7.05 with decreased bicarb PCCM started Bicarb infusion.  Pt not a surgical candidate with ischemic bowel.  Prognosis grim.  Attempted to call wife to discuss goals of care but no answer.  Dr. Tyrell Antonio reported she had discussed prognosis with family last evening and family was going to come to hospital in am to see Luis Orr and discuss with care team goals of care

## 2020-11-13 NOTE — Progress Notes (Signed)
HD order discontinued, Unable to perform HD tx in ICU due to Pt experiencing severe hypotension, currently on high dose of Levophed,  Nephrologist made aware.  ?

## 2020-11-13 NOTE — Progress Notes (Addendum)
[facility] Progress Note Patient Name: Luis Orr DOB: 06/13/1950 MRN: [id number]   Date of Service  10-23-2020  HPI/Events of Note  Hypotension and Bradycardia - HR = 40 and BP 61/48. VBG = 7.05/52.5/40/14.6  eICU Interventions  Plan: Atropine 0.5 mg IV X 2. NaHCO3 100 meq IV X 2. NaHCO3 IV infusion to run IV at 100 mL/hour. Change ventilator settings: 30%/PRVC 20/TV 500/P 5. Repeat ABG at 4 AM. Hospitalist to revisit [place] with family as his extensive dead bowel is not suvivable without surgical intervention which he is not felt to be a candidate for.     Intervention Category Major Interventions: Acid-Base disturbance - evaluation and management;Hypotension - evaluation and management;Arrhythmia - evaluation and management  [name] 10-23-2020, 1:55 AM

## 2020-11-13 NOTE — Progress Notes (Signed)
RT NOTES: Removed patient from vent per withdrawal of life support orders. Pt now comfort care. RN at bedside.

## 2020-11-13 NOTE — Progress Notes (Signed)
PCCM Progress Note  Family meeting held regarding patient's critical status. His condition is nonsurvivable. Dr. Jerald Kief and I reviewed his clinical course with his wife and son in-person and sister via telephone. After addressing questions regarding end-of-life care, patient will be weaned off life support once patient's other son arrives.  Care Tim 30 min  Rodman Pickle, M.D. [facility] Pulmonary/Critical Care Medicine 2020/11/07 12:08 PM   See Amion for personal pager For hours between 7 PM to 7 AM, please call [place] for urgent questions

## 2020-11-13 NOTE — Progress Notes (Signed)
Chaplain responded to page for support of family who had just moved their loved one comfort care.  Chaplain offered ministry of presence to pt's wife and 2 sons.  Chaplain prayed with family.  [place]

## 2020-11-13 DEATH — deceased

## 2021-09-23 IMAGING — US US RENAL
1 series · 13 of 25 positions shown · non-contrast
Comparison: CT 07/17/2020.

CLINICAL DATA: Acute renal insufficiency.

EXAM:
RENAL / URINARY TRACT ULTRASOUND COMPLETE

[Series 1: us renal · 13 of 89 slices shown]
[im 1/89]
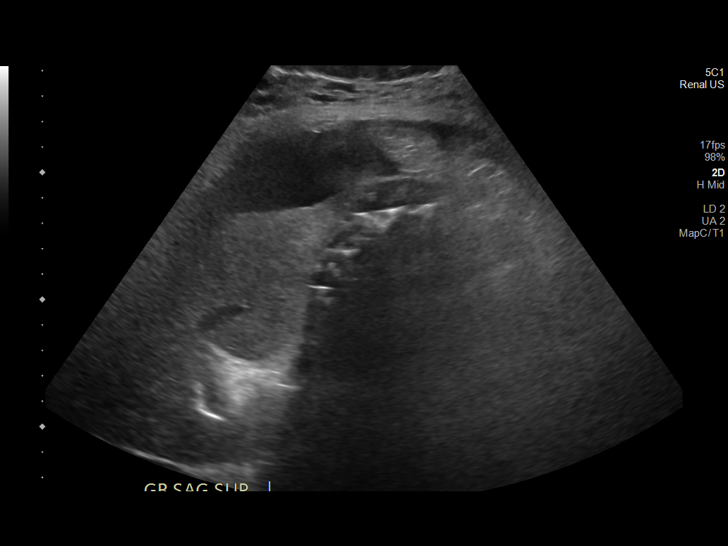
[im 8/89]
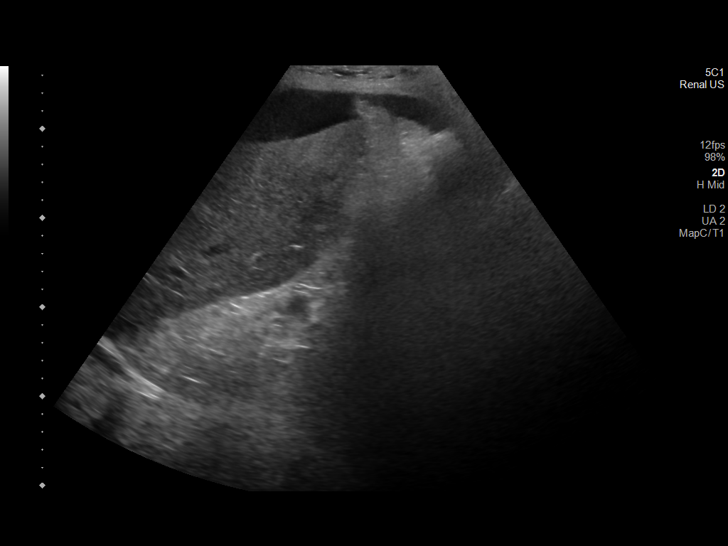
[im 15/89]
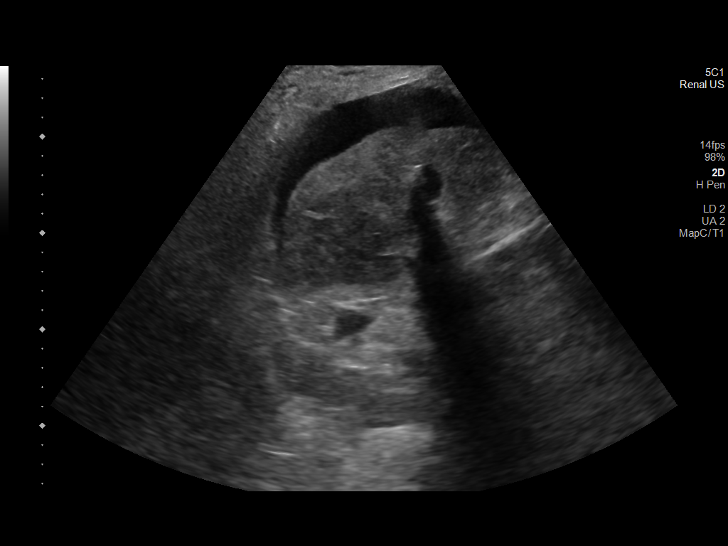
[im 23/89]
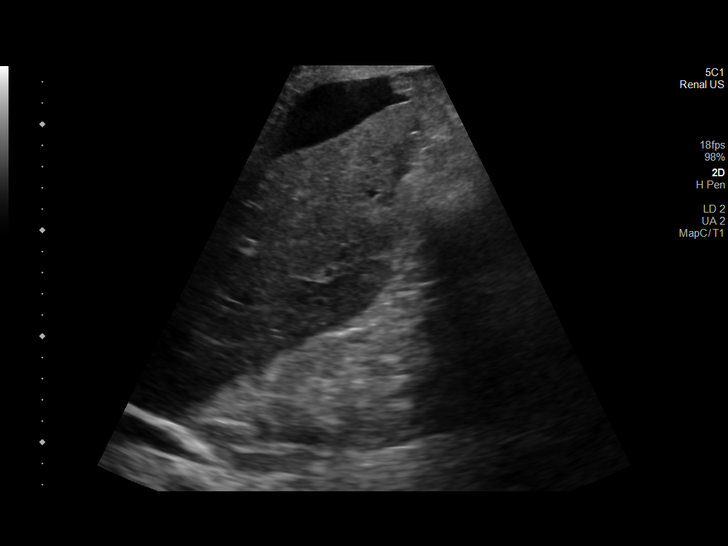
[im 30/89]
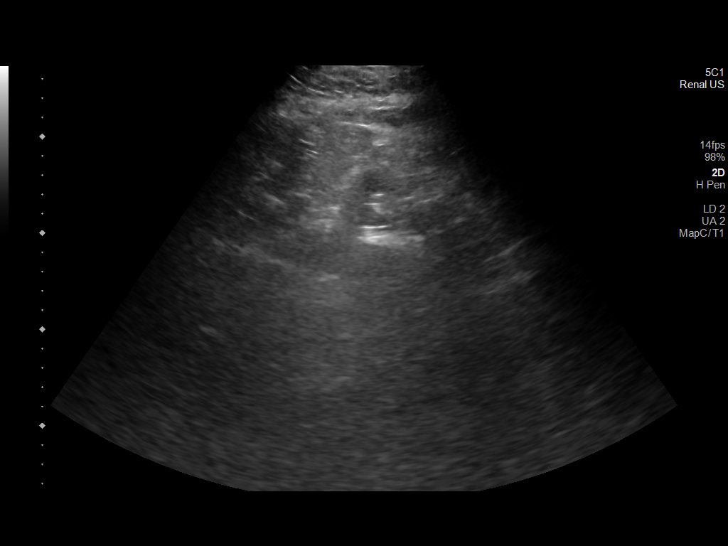
[im 37/89]
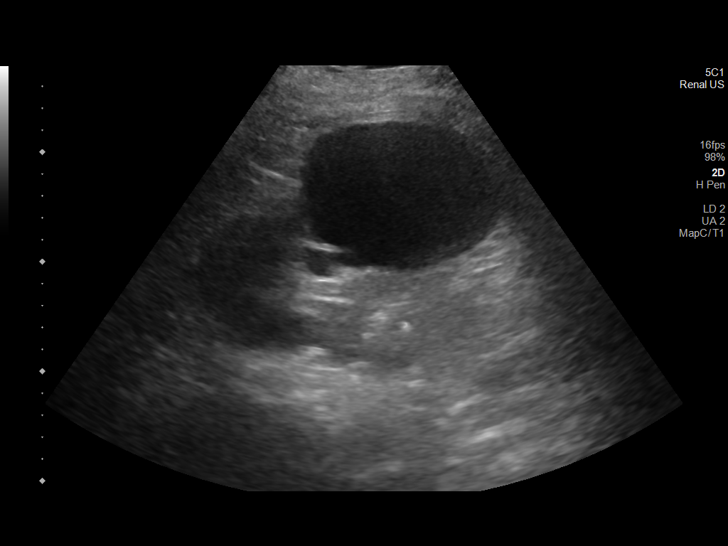
[im 45/89]
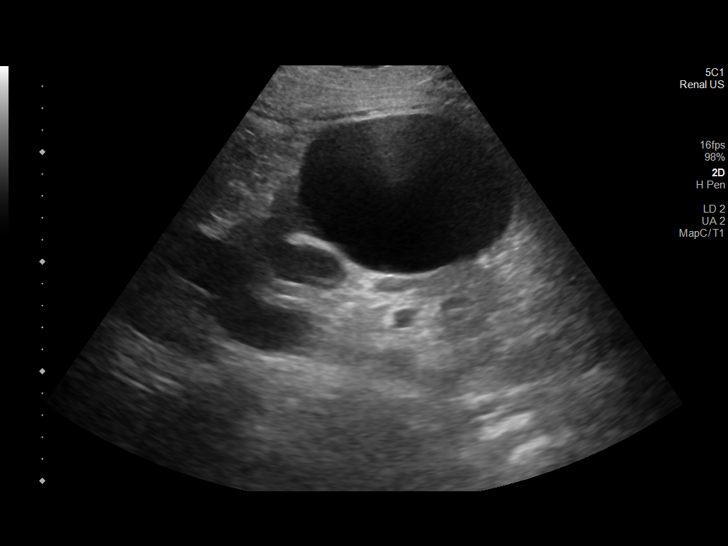
[im 52/89]
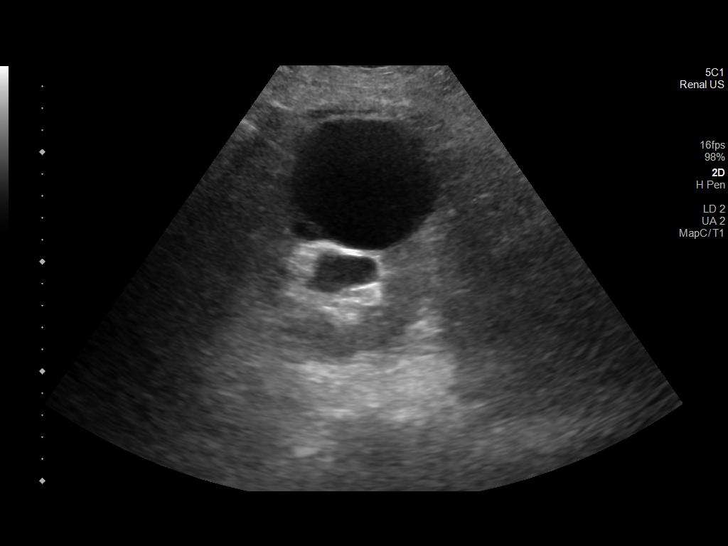
[im 59/89]
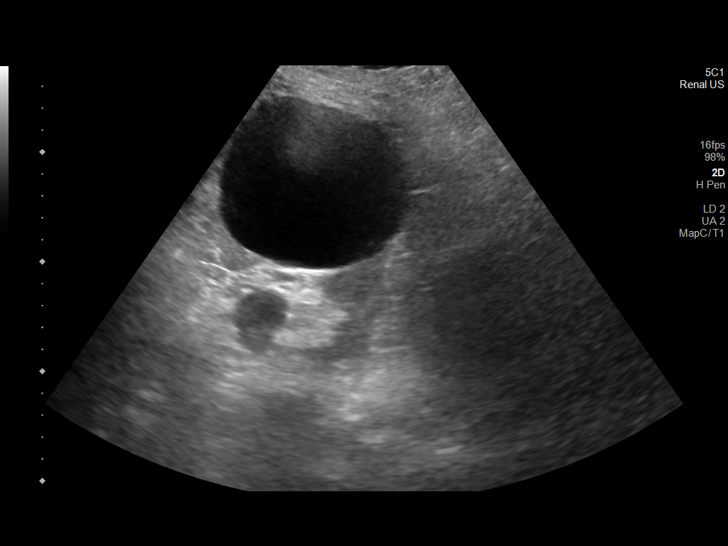
[im 67/89]
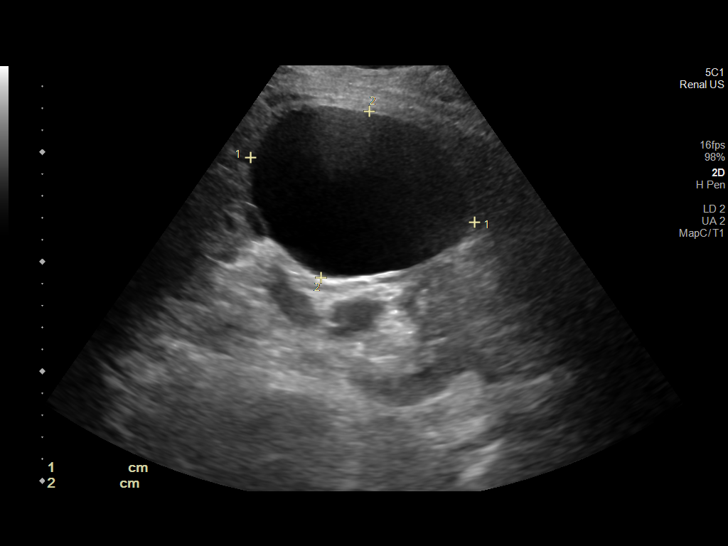
[im 74/89]
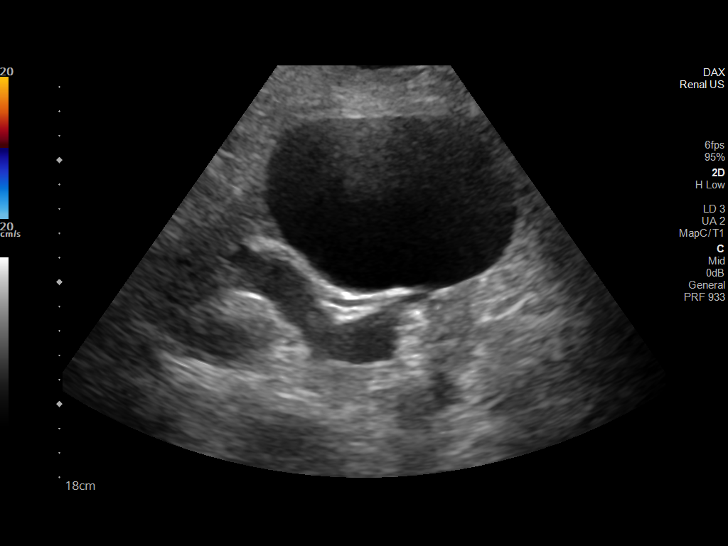
[im 81/89]
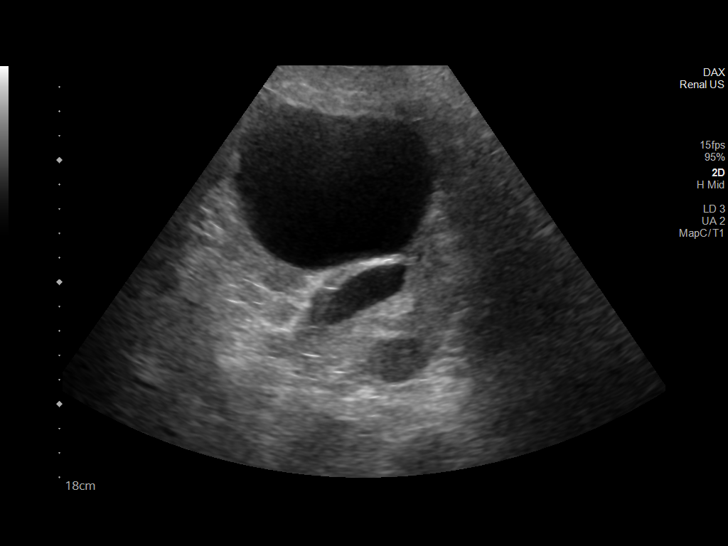
[im 89/89]
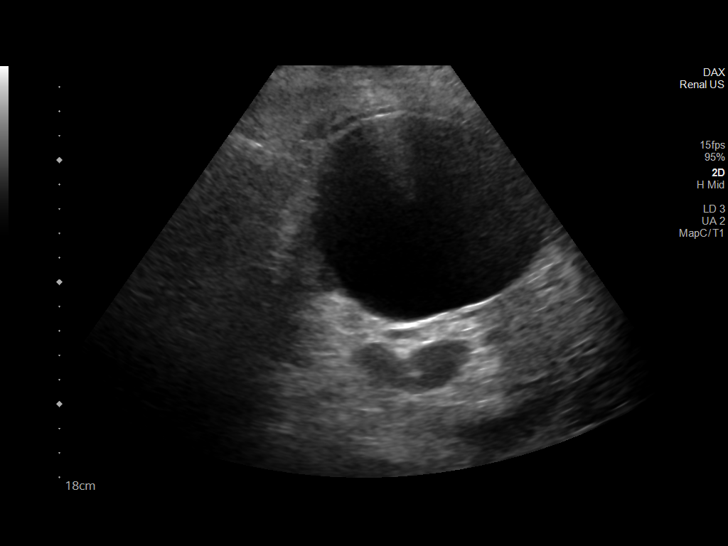

[13 of 25 positions shown; findings below may reference images not displayed]

FINDINGS: Right Kidney:

Renal measurements: 9.4 x 4.2 x 5.4 cm = volume: 110.7 mL. Right
kidney is echogenic. Atrophy right kidney again noted. 1.7 cm simple
cyst. No hydronephrosis visualized. Previously noted right renal
stent not visualized by ultrasound.

Left Kidney:

Renal measurements: 16.6 x 11.8 x 8.3 cm = volume: 851.9 mL.
Echogenicity within normal limits. Multiple simple cysts, the
largest measures 10.6 cm. Mild left hydronephrosis.

Bladder:

Not visualized.

Other:

Incidental note is made of questionable gallstones. Gallbladder wall
is thickened to 3.6 mm. This could be secondary to cholecystitis or
hypoproteinemic states. Mild ascites noted.
IMPRESSION: 1. Right renal atrophy is again noted. Previously noted right
ureteral stent not visualized by ultrasound. 1.7 cm simple right
renal cyst.

2. Multiple simple left renal cysts, the largest measures 10.6 cm.
Similar findings noted on prior exam. Mild left hydronephrosis.
Bladder is not visualized.

3. Incidental note is made of questionable gallstones. Gallbladder
wall is thickened at 3.6 mm. Cholecystitis cannot be excluded.
Hypoproteinemic states can also present this fashion. Mild ascites
also incidentally noted.

## 2021-09-26 IMAGING — DX DG CHEST 1V PORT
1 series · 1 of 1 positions shown · non-contrast
Comparison: Chest x-rays dated 07/18/2020 and 07/17/2020.

CLINICAL DATA: Respiratory failure and pneumonia.

EXAM:
PORTABLE CHEST 1 VIEW

[chest ap]
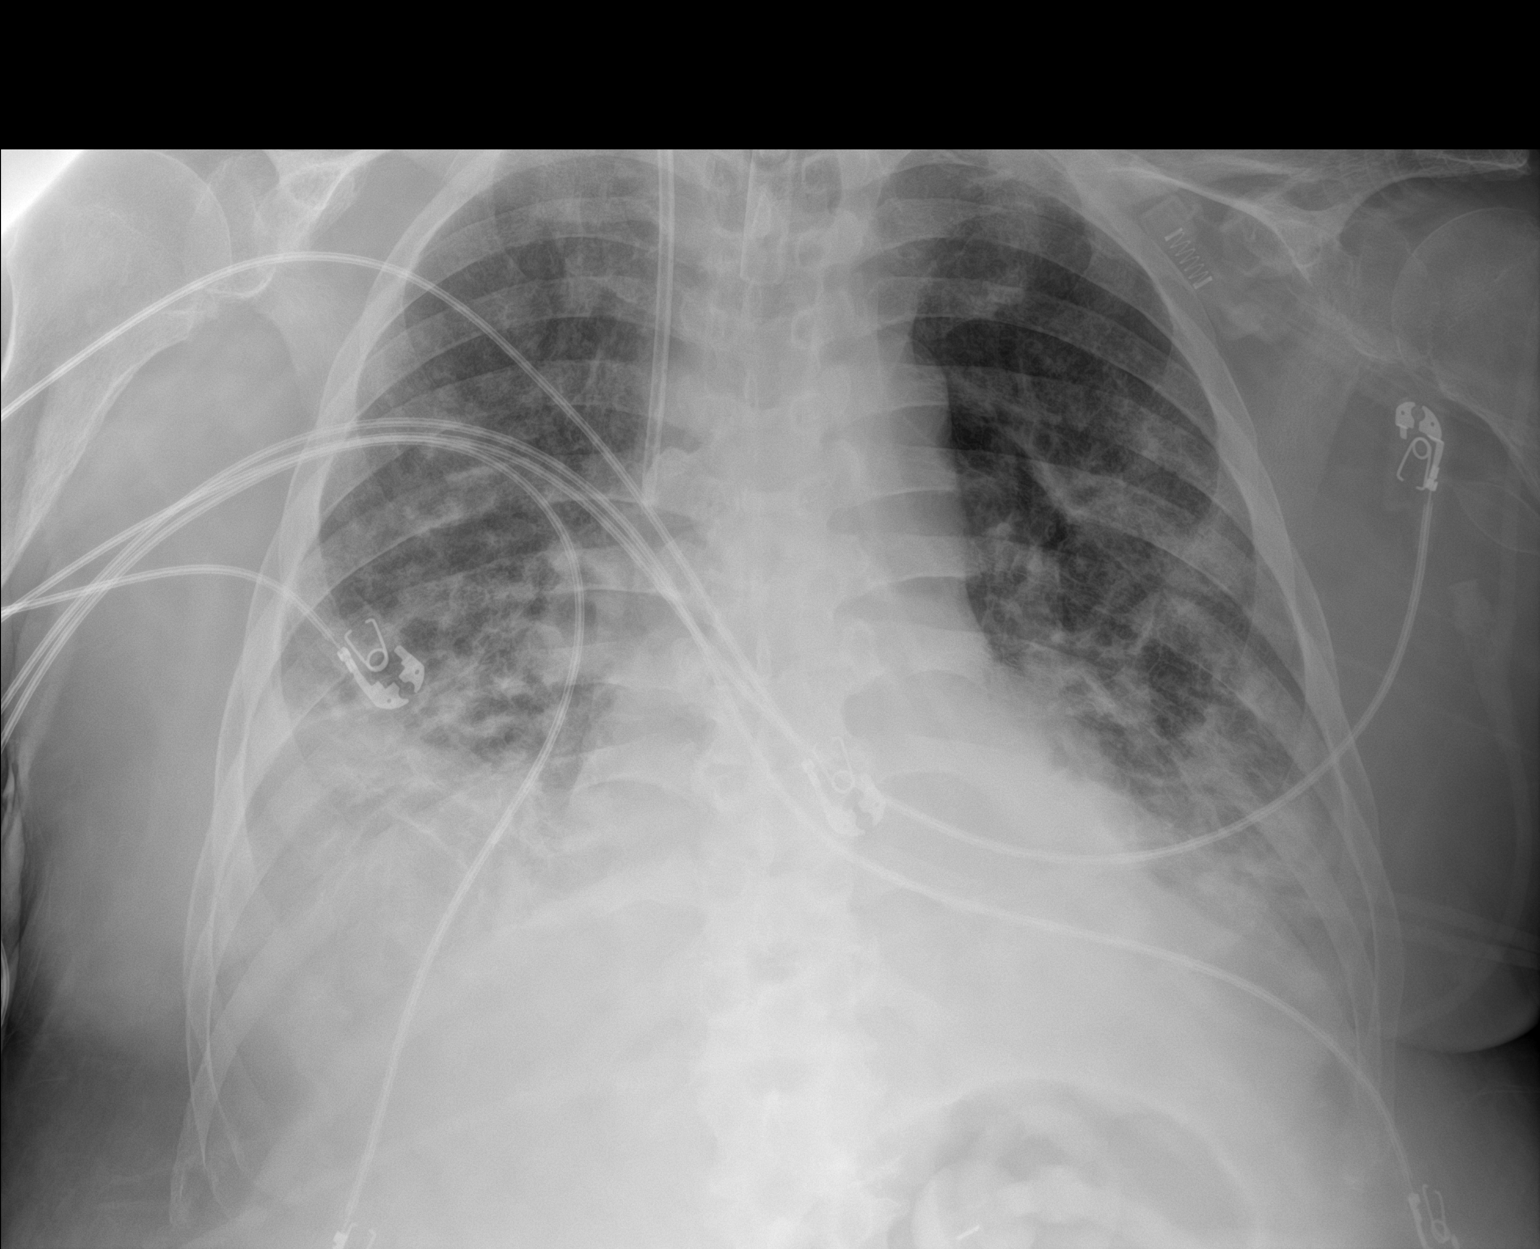

[1 of 1 positions shown; findings below may reference images not displayed]

FINDINGS: Heart size and mediastinal contours are stable. Tubes and lines are
stable in position. Patchy bilateral airspace opacities are not
significantly changed. No pneumothorax is seen.
IMPRESSION: Stable chest x-ray. Patchy bilateral airspace opacities are not
significantly changed, compatible with multifocal pneumonia.

## 2021-09-29 IMAGING — DX DG CHEST 1V PORT
1 series · 2 of 2 positions shown · non-contrast
Comparison: Single-view of the chest 07/21/2020 and 07/18/2020.

CLINICAL DATA: Respiratory failure.  Sepsis.

EXAM:
PORTABLE CHEST 1 VIEW

[Series 1: chest · 0.14mm/px · 2 of 2 slices shown]
[im 1/2]
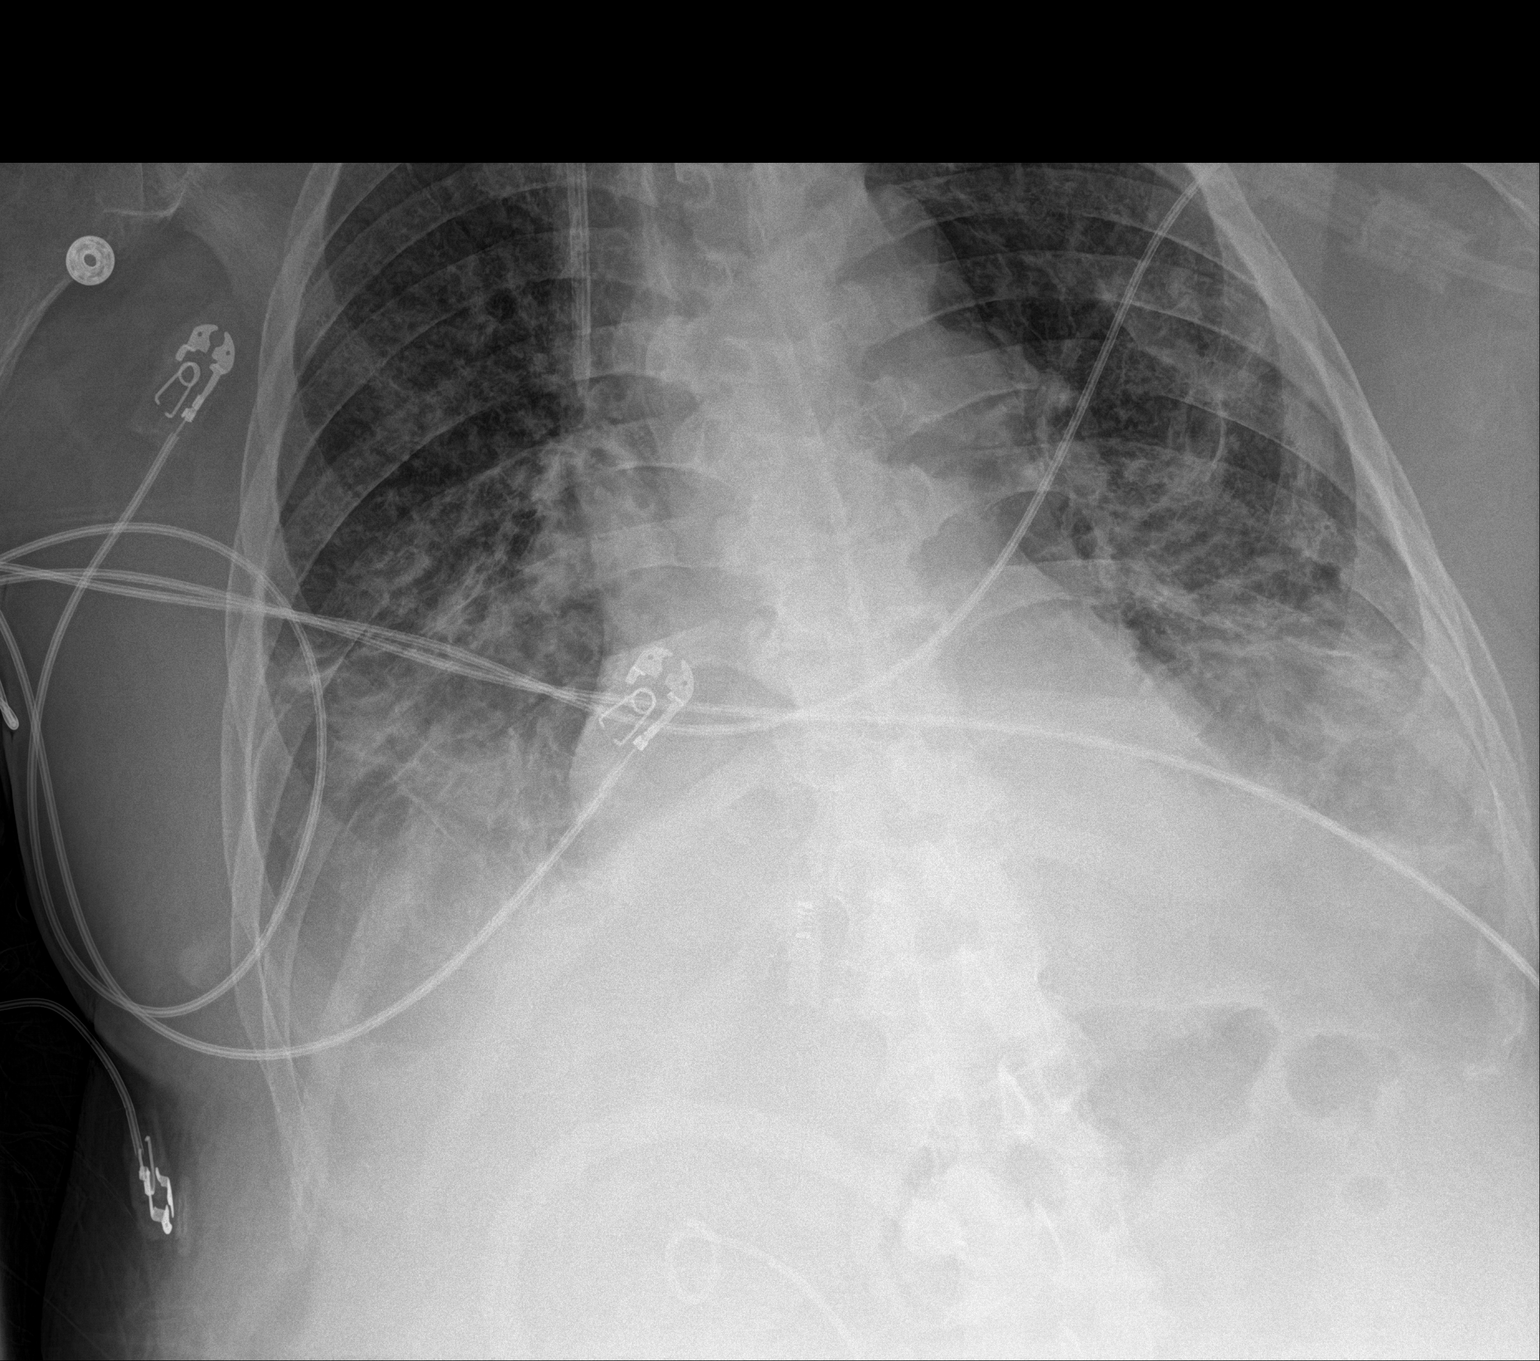
[im 2/2]
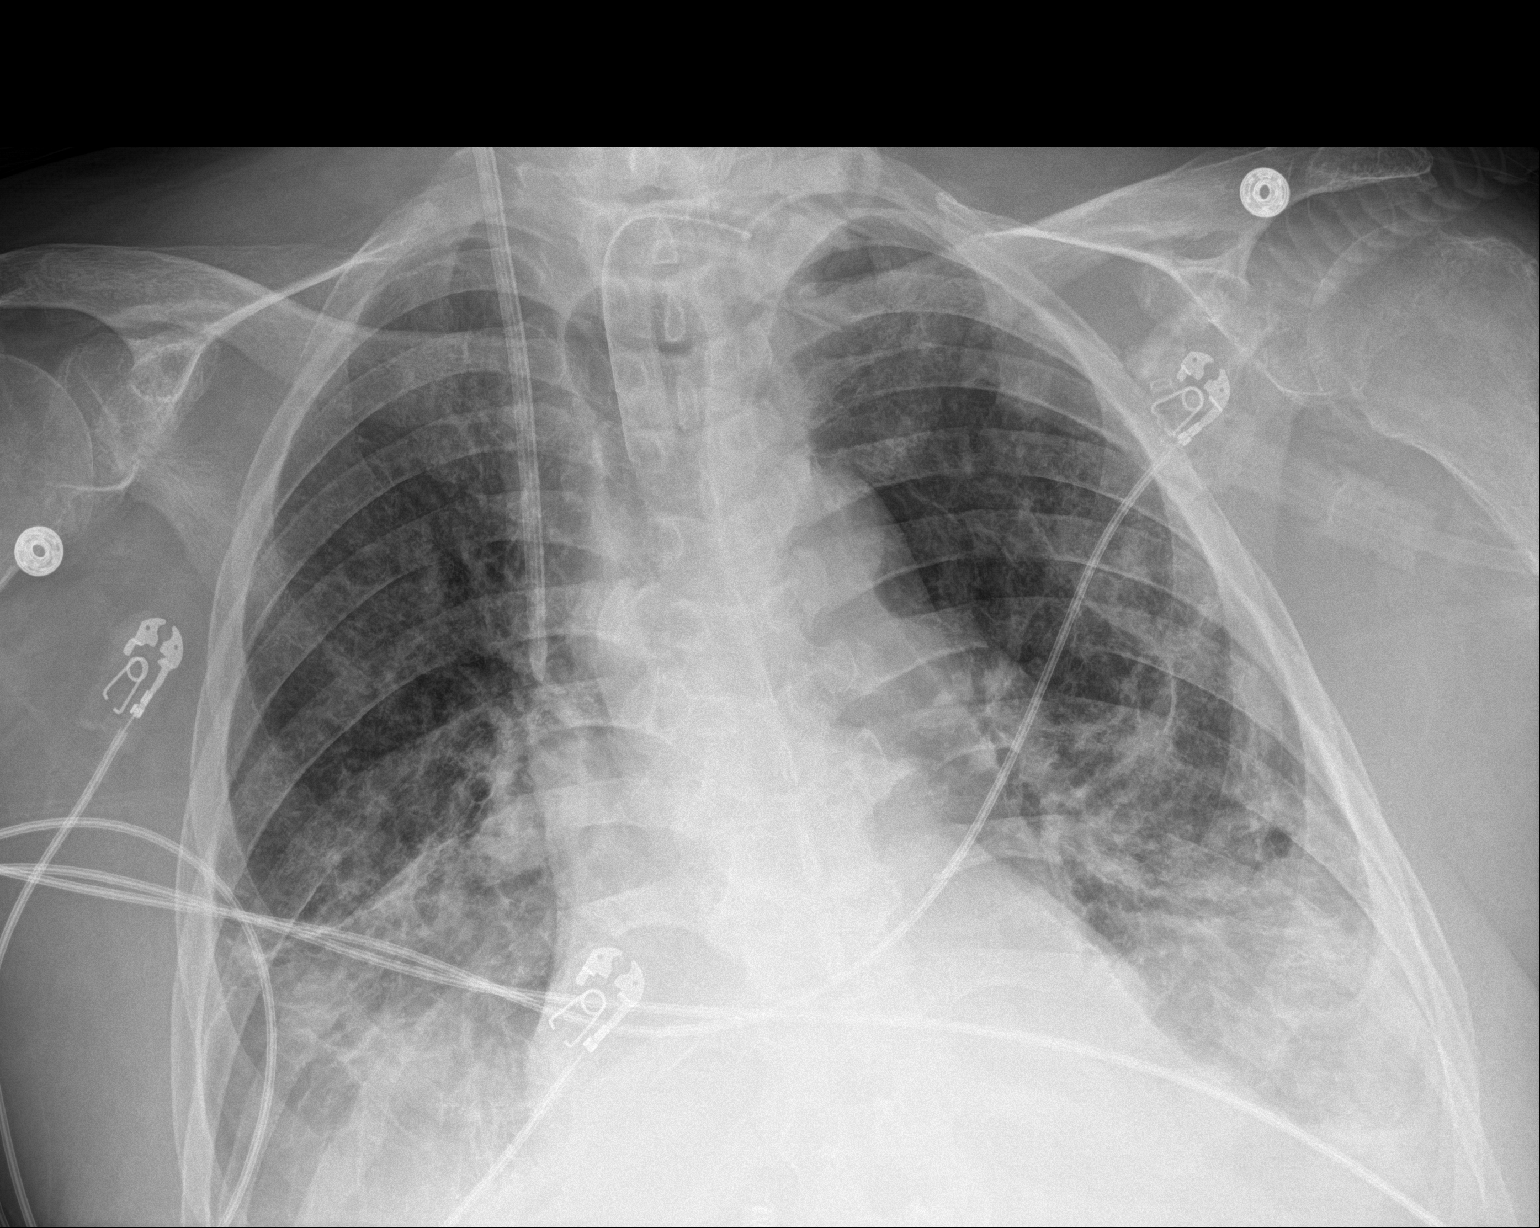

[2 of 2 positions shown; findings below may reference images not displayed]

FINDINGS: Tracheostomy tube and right IJ catheter remain in place. Bilateral
mid and lower lung zone airspace disease and effusions persist.
Effusion on the right appears increased. No pneumothorax. Heart size
is normal.
IMPRESSION: Bilateral pleural effusions and mid and lower lung zone airspace
disease. Left effusion appears somewhat increased. The appearance of
the chest is otherwise unchanged.

## 2021-09-29 IMAGING — DX DG ABD PORTABLE 1V
1 series · 2 of 2 positions shown · non-contrast
Comparison: Single-view of the abdomen 10/11/2019. CT abdomen and
pelvis 07/17/2020.

CLINICAL DATA: Diarrhea in a patient with sepsis.

EXAM:
PORTABLE ABDOMEN - 1 VIEW

[Series 1: abdomen · 0.14mm/px · 2 of 2 slices shown]
[im 1/2]
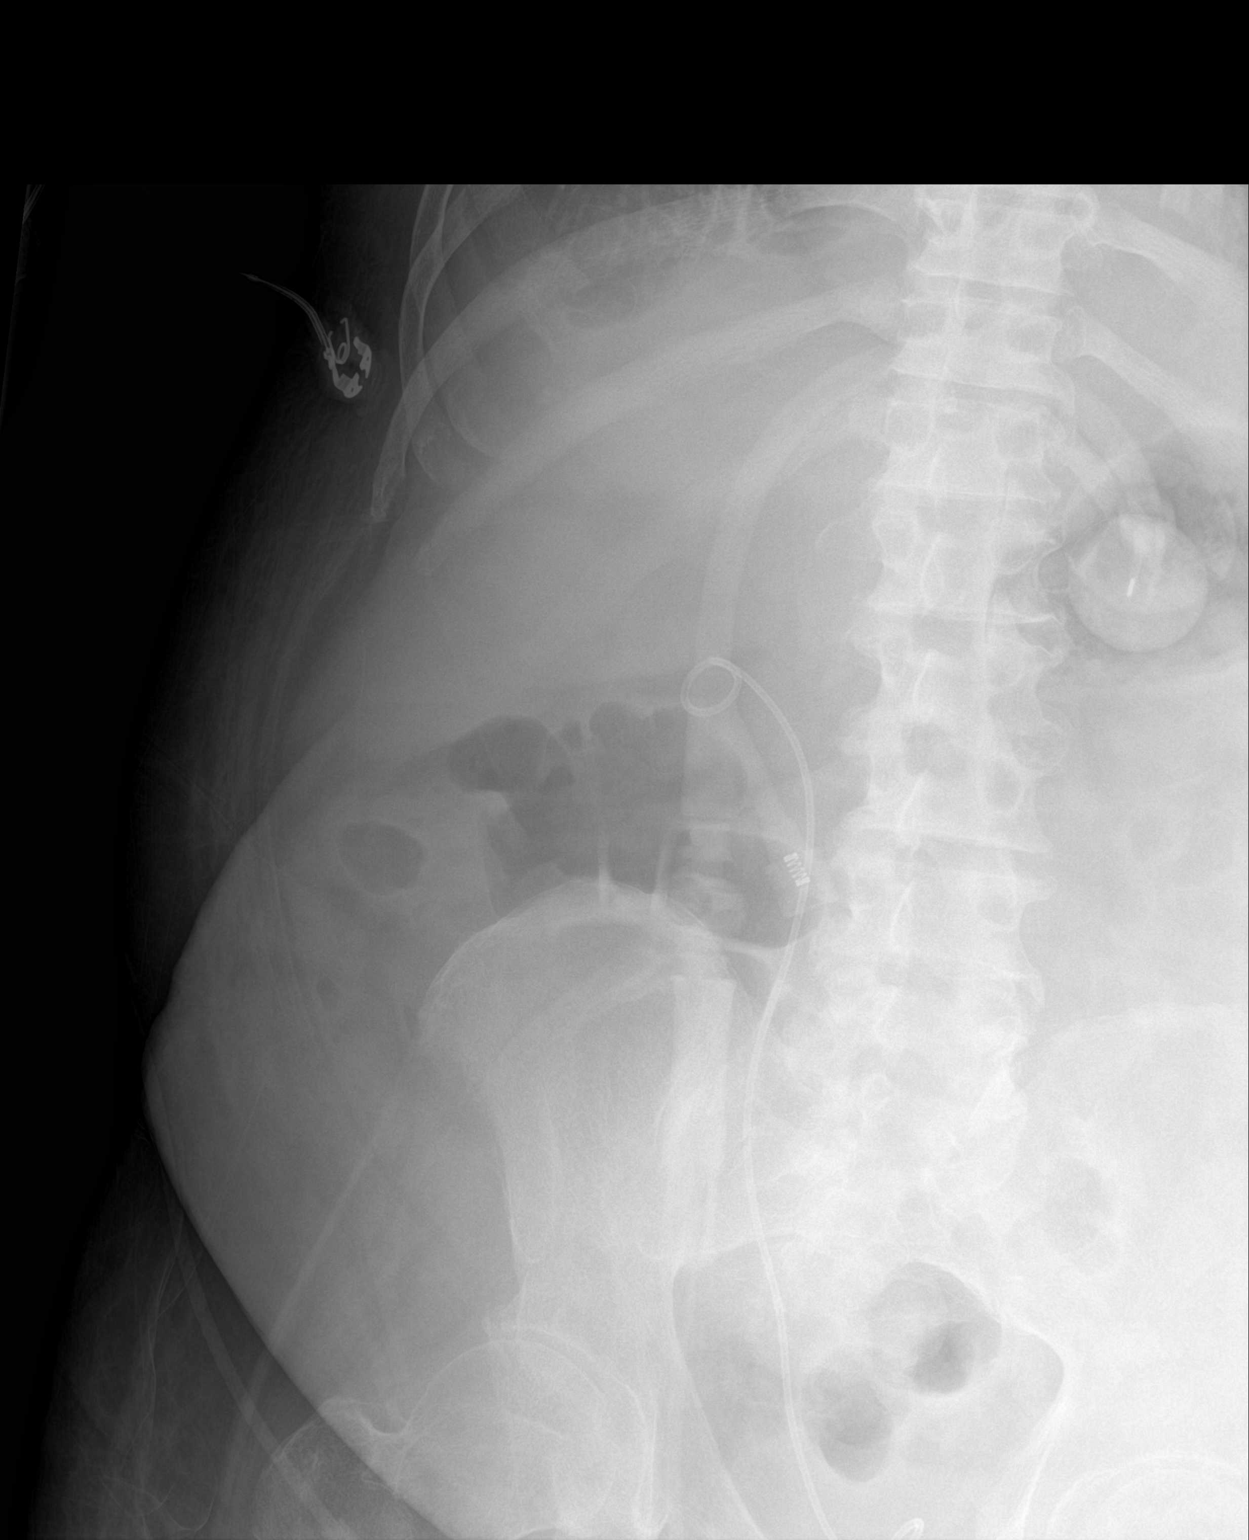
[im 2/2]
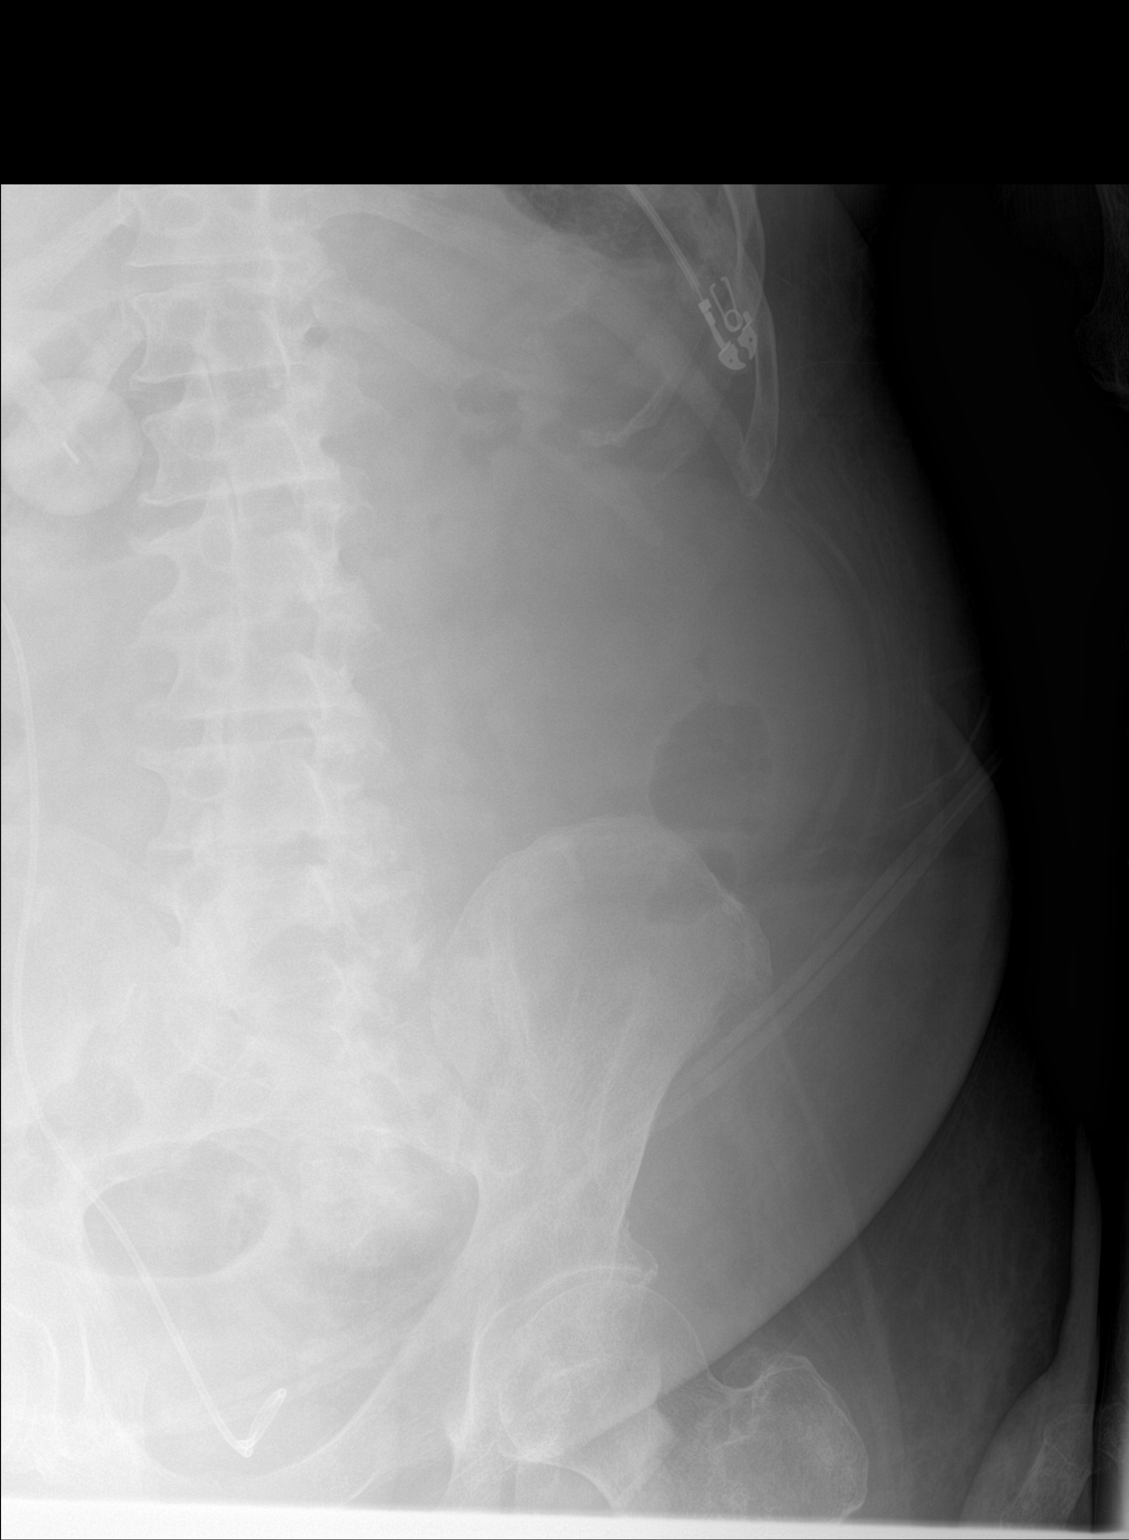

[2 of 2 positions shown; findings below may reference images not displayed]

FINDINGS: The bowel gas pattern is normal. No radio-opaque calculi or other
significant radiographic abnormality are seen. PEG tube and right
double-J ureteral stent again seen
IMPRESSION: No acute finding.

Peg tube and right double-J ureteral stent in place.

## 2021-11-13 IMAGING — DX DG ABDOMEN 1V
1 series · 1 of 1 positions shown · non-contrast
Comparison: Abdominal radiograph 09/05/2020

CLINICAL DATA: G-tube placement.

EXAM:
ABDOMEN - 1 VIEW

[abdomen supine]
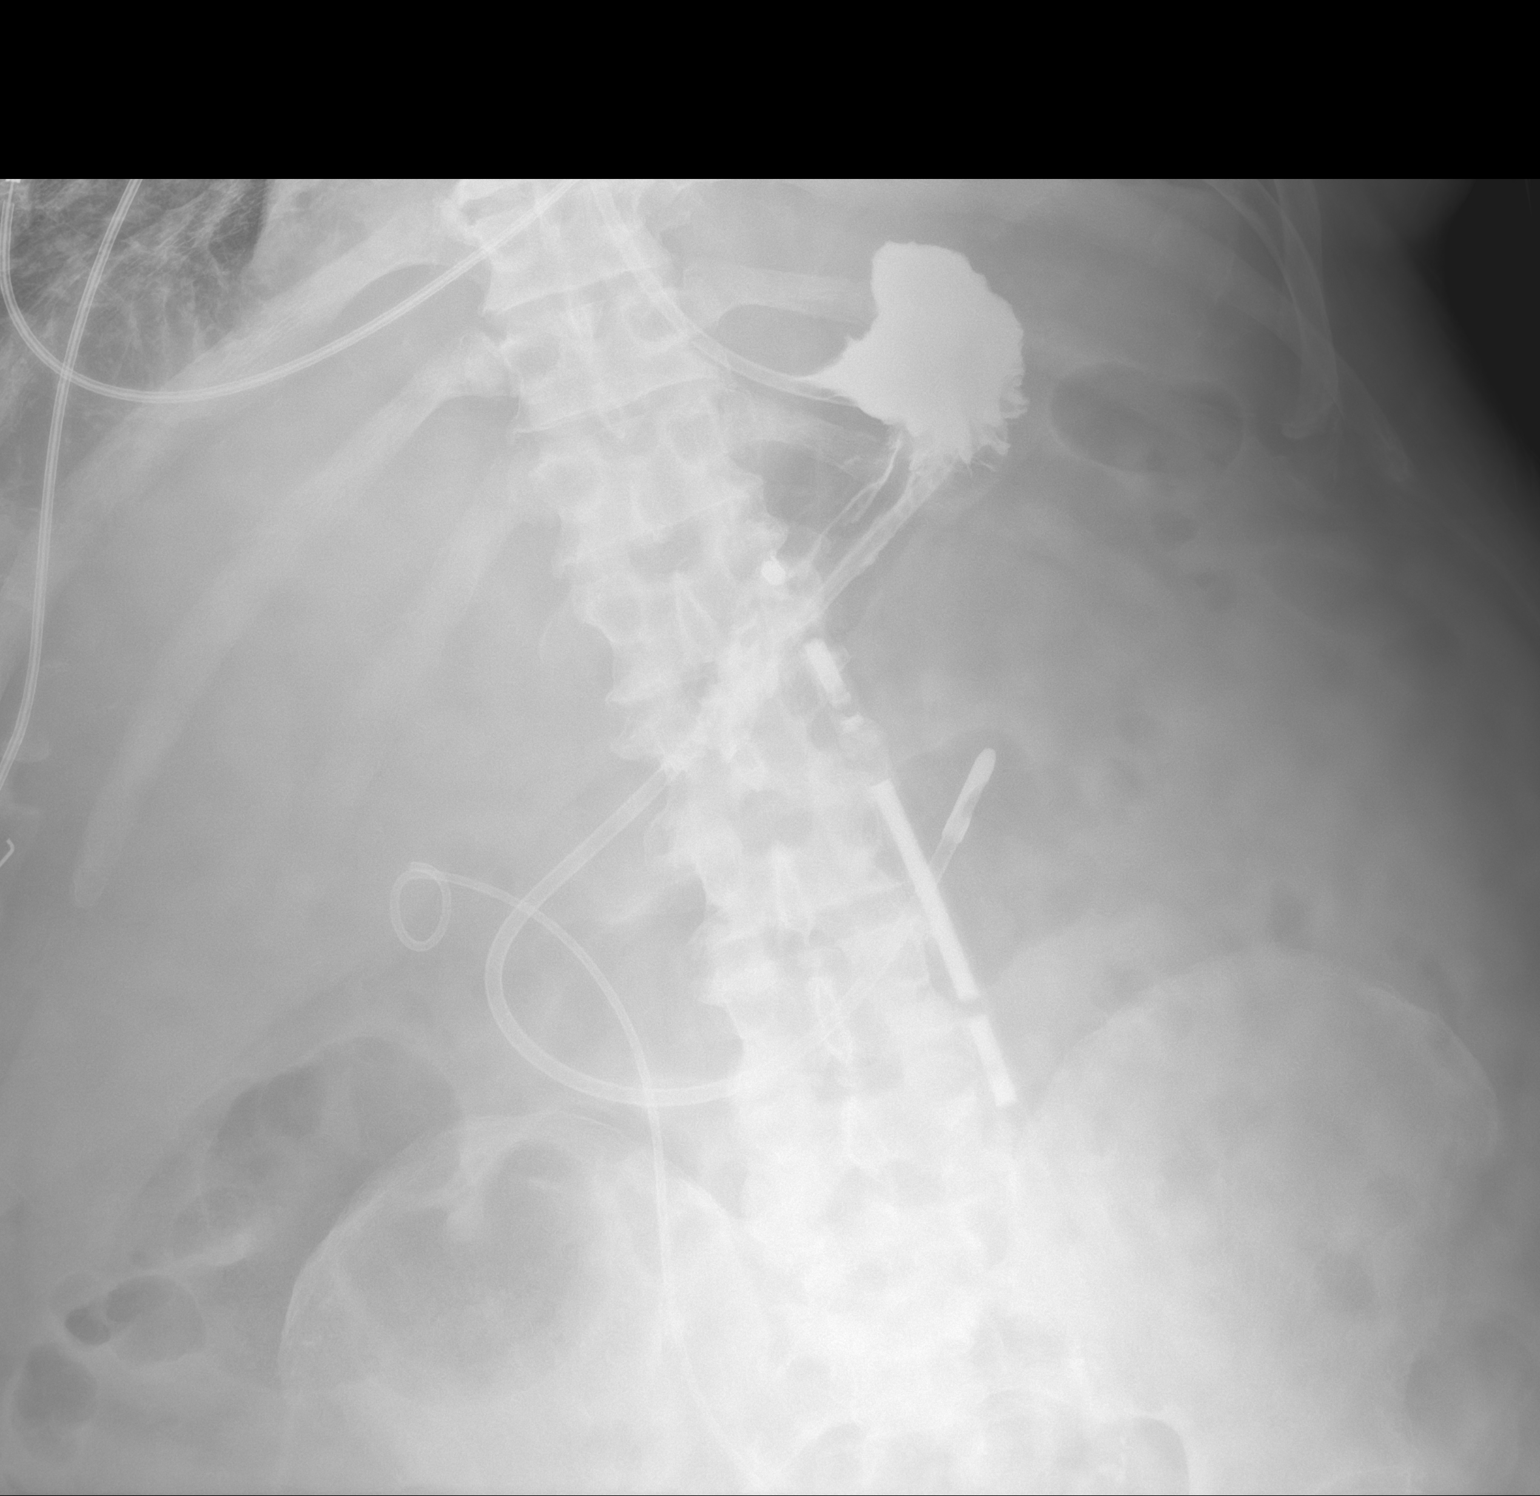

[1 of 1 positions shown; findings below may reference images not displayed]

FINDINGS: Enteric tube tip overlies the expected location of third portion of
the duodenum. Percutaneous gastrostomy tube overlies the distal
gastric body. Oral contrast is seen within the gastric fundus and
antrum.

Scattered air in the stomach, small bowel, and colon with a
nonobstructive bowel gas pattern. Right ureteral stent is partially
visualized.
IMPRESSION: Percutaneous gastrostomy tube overlies the distal gastric body. Oral
contrast is seen within the gastric fundus and antrum.

Remainder of exam is stable.
